# Patient Record
Sex: Female | Born: 1970 | Race: White | Hispanic: No | Marital: Married | Smoking: Former smoker
Health system: Southern US, Community
[De-identification: ages and names within clinical notes are randomized; demographics above are authoritative.]

## PROBLEM LIST (undated history)

## (undated) ENCOUNTER — Emergency Department (HOSPITAL_COMMUNITY): Admission: EM | Payer: Medicare Other | Source: Home / Self Care

## (undated) DIAGNOSIS — I10 Essential (primary) hypertension: Secondary | ICD-10-CM

## (undated) DIAGNOSIS — K219 Gastro-esophageal reflux disease without esophagitis: Secondary | ICD-10-CM

## (undated) DIAGNOSIS — H269 Unspecified cataract: Secondary | ICD-10-CM

## (undated) DIAGNOSIS — K8020 Calculus of gallbladder without cholecystitis without obstruction: Secondary | ICD-10-CM

## (undated) DIAGNOSIS — K2970 Gastritis, unspecified, without bleeding: Secondary | ICD-10-CM

## (undated) DIAGNOSIS — I219 Acute myocardial infarction, unspecified: Secondary | ICD-10-CM

## (undated) DIAGNOSIS — H5440 Blindness, one eye, unspecified eye: Secondary | ICD-10-CM

## (undated) DIAGNOSIS — I739 Peripheral vascular disease, unspecified: Secondary | ICD-10-CM

## (undated) DIAGNOSIS — G629 Polyneuropathy, unspecified: Secondary | ICD-10-CM

## (undated) DIAGNOSIS — I779 Disorder of arteries and arterioles, unspecified: Secondary | ICD-10-CM

## (undated) DIAGNOSIS — L97519 Non-pressure chronic ulcer of other part of right foot with unspecified severity: Secondary | ICD-10-CM

## (undated) DIAGNOSIS — I2510 Atherosclerotic heart disease of native coronary artery without angina pectoris: Secondary | ICD-10-CM

## (undated) DIAGNOSIS — Z85118 Personal history of other malignant neoplasm of bronchus and lung: Secondary | ICD-10-CM

## (undated) DIAGNOSIS — E7800 Pure hypercholesterolemia, unspecified: Secondary | ICD-10-CM

## (undated) DIAGNOSIS — C9500 Acute leukemia of unspecified cell type not having achieved remission: Secondary | ICD-10-CM

## (undated) DIAGNOSIS — R921 Mammographic calcification found on diagnostic imaging of breast: Secondary | ICD-10-CM

## (undated) DIAGNOSIS — D649 Anemia, unspecified: Secondary | ICD-10-CM

## (undated) DIAGNOSIS — I509 Heart failure, unspecified: Secondary | ICD-10-CM

## (undated) DIAGNOSIS — N185 Chronic kidney disease, stage 5: Secondary | ICD-10-CM

## (undated) DIAGNOSIS — N049 Nephrotic syndrome with unspecified morphologic changes: Secondary | ICD-10-CM

## (undated) HISTORY — DX: Gastro-esophageal reflux disease without esophagitis: K21.9

## (undated) HISTORY — PX: INCISION AND DRAINAGE BREAST ABSCESS: SUR672

## (undated) HISTORY — PX: LOBECTOMY: SHX5089

## (undated) HISTORY — DX: Blindness, one eye, unspecified eye: H54.40

## (undated) HISTORY — DX: Atherosclerotic heart disease of native coronary artery without angina pectoris: I25.10

## (undated) HISTORY — DX: Acute leukemia of unspecified cell type not having achieved remission: C95.00

---

## 1992-04-19 HISTORY — PX: TUBAL LIGATION: SHX77

## 2008-04-19 HISTORY — PX: VITRECTOMY: SHX106

## 2009-04-10 ENCOUNTER — Emergency Department (HOSPITAL_COMMUNITY): Admission: EM | Admit: 2009-04-10 | Discharge: 2009-04-10 | Payer: Self-pay | Admitting: Emergency Medicine

## 2010-02-10 ENCOUNTER — Ambulatory Visit (HOSPITAL_COMMUNITY): Admission: RE | Admit: 2010-02-10 | Discharge: 2010-02-10 | Payer: Self-pay | Admitting: Family Medicine

## 2010-07-01 LAB — GLUCOSE, CAPILLARY: Glucose-Capillary: 251 mg/dL — ABNORMAL HIGH (ref 70–99)

## 2010-12-25 ENCOUNTER — Other Ambulatory Visit: Payer: Self-pay

## 2010-12-25 ENCOUNTER — Emergency Department (HOSPITAL_COMMUNITY): Payer: Medicare Other

## 2010-12-25 ENCOUNTER — Emergency Department (HOSPITAL_COMMUNITY)
Admission: EM | Admit: 2010-12-25 | Discharge: 2010-12-25 | Disposition: A | Discharge status: Home / Self Care | Payer: Medicare Other | Attending: Emergency Medicine | Admitting: Emergency Medicine

## 2010-12-25 ENCOUNTER — Encounter: Payer: Self-pay | Admitting: *Deleted

## 2010-12-25 DIAGNOSIS — I498 Other specified cardiac arrhythmias: Secondary | ICD-10-CM | POA: Insufficient documentation

## 2010-12-25 DIAGNOSIS — J40 Bronchitis, not specified as acute or chronic: Secondary | ICD-10-CM | POA: Insufficient documentation

## 2010-12-25 DIAGNOSIS — E119 Type 2 diabetes mellitus without complications: Secondary | ICD-10-CM | POA: Insufficient documentation

## 2010-12-25 HISTORY — DX: Pure hypercholesterolemia, unspecified: E78.00

## 2010-12-25 LAB — BASIC METABOLIC PANEL
BUN: 19 mg/dL (ref 6–23)
CO2: 28 mEq/L (ref 19–32)
Calcium: 8.2 mg/dL — ABNORMAL LOW (ref 8.4–10.5)
Chloride: 101 mEq/L (ref 96–112)
GFR calc non Af Amer: >60 mL/min (ref >60)
Glucose, Bld: 247 mg/dL — ABNORMAL HIGH (ref 70–99)
Potassium: 4.2 mEq/L (ref 3.5–5.1)
Sodium: 133 mEq/L — ABNORMAL LOW (ref 135–145)

## 2010-12-25 LAB — DIFFERENTIAL
Basophils Absolute: 0 10*3/uL (ref 0.0–0.1)
Basophils Relative: 0 % (ref 0–1)
Eosinophils Absolute: 0.1 10*3/uL (ref 0.0–0.7)
Lymphocytes Relative: 14 % (ref 12–46)
Lymphs Abs: 1.6 10*3/uL (ref 0.7–4.0)
Monocytes Absolute: 0.7 10*3/uL (ref 0.1–1.0)
Monocytes Relative: 6 % (ref 3–12)
Neutrophils Relative %: 80 % — ABNORMAL HIGH (ref 43–77)

## 2010-12-25 LAB — CBC
HCT: 40.3 % (ref 36.0–46.0)
Hemoglobin: 13.8 g/dL (ref 12.0–15.0)
MCH: 31.4 pg (ref 26.0–34.0)
MCHC: 34.2 g/dL (ref 30.0–36.0)
MCV: 91.8 fL (ref 78.0–100.0)
RBC: 4.39 MIL/uL (ref 3.87–5.11)
RDW: 13 % (ref 11.5–15.5)
WBC: 11.9 10*3/uL — ABNORMAL HIGH (ref 4.0–10.5)

## 2010-12-25 MED ORDER — ALBUTEROL SULFATE (5 MG/ML) 0.5% IN NEBU
5.0000 mg | INHALATION_SOLUTION | Freq: Once | RESPIRATORY_TRACT | Status: AC
  Administered 2010-12-25: 5 mg via RESPIRATORY_TRACT
  Filled 2010-12-25: qty 1

## 2010-12-25 MED ORDER — IPRATROPIUM BROMIDE 0.02 % IN SOLN
0.5000 mg | Freq: Once | RESPIRATORY_TRACT | Status: AC
  Administered 2010-12-25: 0.5 mg via RESPIRATORY_TRACT
  Filled 2010-12-25: qty 2.5

## 2010-12-25 MED ORDER — PREDNISONE 20 MG PO TABS: ORAL_TABLET | ORAL | Status: DC

## 2010-12-25 MED ORDER — OXYCODONE-ACETAMINOPHEN 5-325 MG PO TABS
1.0000 | ORAL_TABLET | Freq: Once | ORAL | Status: AC
  Administered 2010-12-25: 1 via ORAL
  Filled 2010-12-25: qty 1

## 2010-12-25 MED ORDER — SODIUM CHLORIDE 0.9 % IV BOLUS (SEPSIS)
1000.0000 mL | Freq: Once | INTRAVENOUS | Status: AC
  Administered 2010-12-25: 1000 mL via INTRAVENOUS

## 2010-12-25 MED ORDER — DOXYCYCLINE HYCLATE 100 MG PO CAPS: 100.0000 mg | ORAL_CAPSULE | Freq: Two times a day (BID) | ORAL | Status: AC

## 2010-12-25 MED ORDER — PREDNISONE 20 MG PO TABS
60.0000 mg | ORAL_TABLET | Freq: Once | ORAL | Status: AC
  Administered 2010-12-25: 60 mg via ORAL
  Filled 2010-12-25: qty 3

## 2010-12-25 MED ORDER — ALBUTEROL SULFATE HFA 108 (90 BASE) MCG/ACT IN AERS
2.0000 | INHALATION_SPRAY | Freq: Once | RESPIRATORY_TRACT | Status: AC
  Administered 2010-12-25: 2 via RESPIRATORY_TRACT
  Filled 2010-12-25: qty 6.7

## 2010-12-25 NOTE — ED Notes (Signed)
Pt c/o nasal congestion x 4 days. States that she started coughing and it "moved into her chest" 2 days ago. Pt denies productive cough. Alert and oriented x 3. Skin warm and dry. Color pink. Breath sounds equal with bilateral expiratory wheezing.

## 2010-12-25 NOTE — ED Notes (Signed)
Family at bedside. 

## 2010-12-25 NOTE — ED Provider Notes (Signed)
History     CSN: [id number] Arrival date & time: 12/25/2010 12:49 PM Seen at 1350 Chief Complaint  Patient presents with  . Shortness of Breath   Patient is a 40 y.o. female presenting with shortness of breath. The history is provided by the patient.  Shortness of Breath  The current episode started yesterday. The problem is moderate. The symptoms are relieved by nothing. The symptoms are aggravated by nothing. Associated symptoms include cough and shortness of breath.  pt reports recent cough/congestion about 4 days ago.  Then in past 24 hours she has developed upper back pressure and feels SOB as well.  She also continues to have non-productive cough.   She also feel dyspnea on exertion   Past Medical History  Diagnosis Date  . Diabetes mellitus   . High cholesterol     No past surgical history on file.  No family history on file.  History  Substance Use Topics  . Smoking status: Not on file  . Smokeless tobacco: Not on file  . Alcohol Use:     OB History    Grav Para Term Preterm Abortions TAB SAB Ect Mult Living                  Review of Systems  Respiratory: Positive for cough and shortness of breath.   All other systems reviewed and are negative.    Physical Exam  BP 130/80  Pulse 110  Temp(Src) 98.5 F (36.9 C) (Oral)  Resp 22  Ht 5\' 7"  (1.702 m)  Wt 230 lb (104.327 kg)  BMI 36.02 kg/m2  SpO2 95%  Physical Exam  CONSTITUTIONAL: Well developed/well nourished HEAD AND FACE: Normocephalic/atraumatic EYES: EOMI/PERRL ENMT: Mucous membranes moist, nasal congestion NECK: supple no meningeal signs CV: S1/S2 noted, no murmurs/rubs/gallops noted LUNGS:rales left base, slight tachypnea noted ABDOMEN: soft, nontender, no rebound or guarding NEURO: Pt is awake/alert, moves all extremitiesx4 EXTREMITIES: pulses normal, full ROM, chronic/symmetric pitting edema noted bilateral LE SKIN: warm, color normal PSYCH: no abnormalities of mood noted   ED Course   Procedures  MDM Nursing notes reviewed and considered in documentation All labs/vitals reviewed and considered xrays reviewed and considered   Pt with cough/congestion now rales left base, concern for pneumonia    Date: 12/25/2010  Rate: 105  Rhythm: sinus tachycardia  QRS Axis: right  Intervals: normal  ST/T Wave abnormalities: nonspecific ST changes  Conduction Disutrbances:none  Narrative Interpretation:   Old EKG Reviewed: none available   4:02 PM Pt feels improved On repeat exam, now has bilateral wheezes, rales improved after nebs No signs of pneumonia on CXR She reports she only mild back discomfort, not pleuritic.  No h/o DVT/PE.  Not on estrogen products Reports h/o lung nodule that was found to be benign Suspect this is more c/w bronchitis, doubt PE Pt improved and wants to go home, discused strict return precautions 5:35 PM   Results for orders placed during the hospital encounter of 12/25/10  BASIC METABOLIC PANEL      Component Value Range   Sodium 133 (*) 135 - 145 (mEq/L)   Potassium 4.2  3.5 - 5.1 (mEq/L)   Chloride 101  96 - 112 (mEq/L)   CO2 28  19 - 32 (mEq/L)   Glucose, Bld 247 (*) 70 - 99 (mg/dL)   BUN 19  6 - 23 (mg/dL)   Creatinine, Ser 4.09  0.50 - 1.10 (mg/dL)   Calcium 8.2 (*) 8.4 - 10.5 (mg/dL)   GFR  calc non Af Amer >60  >60 (mL/min)   GFR calc Af Amer >60  >60 (mL/min)  CBC      Component Value Range   WBC 11.9 (*) 4.0 - 10.5 (K/uL)   RBC 4.39  3.87 - 5.11 (MIL/uL)   Hemoglobin 13.8  12.0 - 15.0 (g/dL)   HCT 78.2  95.6 - 21.3 (%)   MCV 91.8  78.0 - 100.0 (fL)   MCH 31.4  26.0 - 34.0 (pg)   MCHC 34.2  30.0 - 36.0 (g/dL)   RDW 08.6  57.8 - 46.9 (%)   Platelets 263  150 - 400 (K/uL)  DIFFERENTIAL      Component Value Range   Neutrophils Relative 80 (*) 43 - 77 (%)   Neutro Abs 9.5 (*) 1.7 - 7.7 (K/uL)   Lymphocytes Relative 14  12 - 46 (%)   Lymphs Abs 1.6  0.7 - 4.0 (K/uL)   Monocytes Relative 6  3 - 12 (%)   Monocytes  Absolute 0.7  0.1 - 1.0 (K/uL)   Eosinophils Relative 0  0 - 5 (%)   Eosinophils Absolute 0.1  0.0 - 0.7 (K/uL)   Basophils Relative 0  0 - 1 (%)   Basophils Absolute 0.0  0.0 - 0.1 (K/uL)   Dg Chest 2 View  12/25/2010  *RADIOLOGY REPORT*  Clinical Data: Short of breath, cough  CHEST - 2 VIEW  Comparison: None.  Findings: The lungs are clear.  The heart is within normal limits in size.  No bony abnormality is seen.  IMPRESSION: No active lung disease.  Original Report Authenticated By: Juline Patch, M.D.          Joya Gaskins, MD 12/25/10 [phone]

## 2010-12-25 NOTE — ED Notes (Signed)
C/o URI s/s x 1 week with non-productive cough; reports onset of shortness of breath last night, worse today; c/o dull ache to LUE and left neck onset last night

## 2010-12-25 NOTE — ED Notes (Signed)
Pt c/o waking up at 5 am and getting very dizzy and falling down. Pt states that when she lies down that she feels pressure in her neck and chest and that she can't breath. Pt also c/o numbness and tingling in her right arm. Pt alert and oriented x 3. Skin warm and dry. Color pink. Breath sounds clear and equal bilaterally. Strong hand grips bilaterally. CCM showing NSR.

## 2010-12-26 ENCOUNTER — Encounter (HOSPITAL_COMMUNITY): Payer: Self-pay | Admitting: *Deleted

## 2011-03-20 ENCOUNTER — Emergency Department (HOSPITAL_COMMUNITY)
Admission: EM | Admit: 2011-03-20 | Discharge: 2011-03-20 | Disposition: A | Discharge status: Home / Self Care | Payer: Medicare Other | Attending: Emergency Medicine | Admitting: Emergency Medicine

## 2011-03-20 ENCOUNTER — Encounter (HOSPITAL_COMMUNITY): Payer: Self-pay

## 2011-03-20 ENCOUNTER — Emergency Department (HOSPITAL_COMMUNITY): Payer: Medicare Other

## 2011-03-20 DIAGNOSIS — J40 Bronchitis, not specified as acute or chronic: Secondary | ICD-10-CM

## 2011-03-20 DIAGNOSIS — F17200 Nicotine dependence, unspecified, uncomplicated: Secondary | ICD-10-CM | POA: Insufficient documentation

## 2011-03-20 DIAGNOSIS — J069 Acute upper respiratory infection, unspecified: Secondary | ICD-10-CM | POA: Insufficient documentation

## 2011-03-20 DIAGNOSIS — E119 Type 2 diabetes mellitus without complications: Secondary | ICD-10-CM | POA: Insufficient documentation

## 2011-03-20 DIAGNOSIS — E7800 Pure hypercholesterolemia, unspecified: Secondary | ICD-10-CM | POA: Insufficient documentation

## 2011-03-20 MED ORDER — AZITHROMYCIN 250 MG PO TABS: 250.0000 mg | ORAL_TABLET | Freq: Every day | ORAL | Status: AC

## 2011-03-20 NOTE — ED Notes (Signed)
Pt presents with URI symptoms and hymoptysis that started today.

## 2011-03-20 NOTE — ED Notes (Signed)
Pt given discharge instructions, paperwork & prescription(s), pt verbalized understanding.   

## 2011-03-20 NOTE — ED Provider Notes (Signed)
History     CSN: [id number] Arrival date & time: 03/20/2011  6:17 PM   First MD Initiated Contact with Patient 03/20/11 1832      Chief Complaint  Patient presents with  . Hemoptysis  . URI    (Consider location/radiation/quality/duration/timing/severity/associated sxs/prior treatment) Patient is a 40 y.o. female presenting with URI and cough. The history is provided by the patient (the pt complains of a cough.  she states she has coughed up some blood).  URI The primary symptoms include fatigue and cough. Primary symptoms do not include fever, headaches, sore throat, abdominal pain or rash. The current episode started yesterday. This is a new problem. The problem has not changed since onset. The fatigue began today. The fatigue has been worsening since its onset. The fatigue is worsened by exercise.  Symptoms associated with the illness include plugged ear sensation. The illness is not associated with chills, facial pain, sinus pressure or congestion.  Cough This is a new problem. The current episode started yesterday. The problem occurs constantly. The problem has not changed since onset.The cough is productive of sputum. There has been no fever. Pertinent negatives include no chest pain, no chills, no headaches and no sore throat. She has tried nothing for the symptoms. She is not a smoker. Her past medical history does not include bronchiectasis or COPD.    Past Medical History  Diagnosis Date  . Diabetes mellitus   . High cholesterol     Past Surgical History  Procedure Date  . Cesarean section   . Breast surgery     No family history on file.  History  Substance Use Topics  . Smoking status: Current Everyday Smoker -- 2.0 packs/day  . Smokeless tobacco: Not on file  . Alcohol Use: No    OB History    Grav Para Term Preterm Abortions TAB SAB Ect Mult Living                  Review of Systems  Constitutional: Positive for fatigue. Negative for fever and chills.   HENT: Negative for congestion, sore throat, sinus pressure and ear discharge.   Eyes: Negative for discharge.  Respiratory: Positive for cough.   Cardiovascular: Negative for chest pain.  Gastrointestinal: Negative for abdominal pain and diarrhea.  Genitourinary: Negative for frequency and hematuria.  Musculoskeletal: Negative for back pain.  Skin: Negative for rash.  Neurological: Negative for seizures and headaches.  Hematological: Negative.   Psychiatric/Behavioral: Negative for hallucinations.    Allergies  Review of patient's allergies indicates no known allergies.  Home Medications   Current Outpatient Rx  Name Route Sig Dispense Refill  . GLIPIZIDE 5 MG PO TABS Oral Take 5 mg by mouth 2 (two) times daily before a meal.      . METFORMIN HCL 1000 MG PO TABS Oral Take 1,000 mg by mouth 2 (two) times daily.      . AZITHROMYCIN 250 MG PO TABS Oral Take 1 tablet (250 mg total) by mouth daily. Take first 2 tablets together, then 1 every day until finished. 6 tablet 0    BP 157/98  Pulse 101  Temp(Src) 97.9 F (36.6 C) (Oral)  Resp 16  Ht 5\' 7"  (1.702 m)  Wt 224 lb (101.606 kg)  BMI 35.08 kg/m2  SpO2 97%  LMP 03/06/2011  Physical Exam  Constitutional: She is oriented to person, place, and time. She appears well-developed.  HENT:  Head: Normocephalic and atraumatic.  Eyes: Conjunctivae and EOM are normal.  No scleral icterus.  Neck: Neck supple. No thyromegaly present.  Cardiovascular: Normal rate and regular rhythm.  Exam reveals no gallop and no friction rub.   No murmur heard. Pulmonary/Chest: No stridor. She has no wheezes. She has no rales. She exhibits no tenderness.  Abdominal: She exhibits no distension. There is no tenderness. There is no rebound.  Musculoskeletal: Normal range of motion. She exhibits no edema.  Lymphadenopathy:    She has no cervical adenopathy.  Neurological: She is oriented to person, place, and time. Coordination normal.  Skin: No rash  noted. No erythema.  Psychiatric: She has a normal mood and affect. Her behavior is normal.    ED Course  Procedures (including critical care time)  Labs Reviewed - No data to display Dg Chest 2 View  03/20/2011  *RADIOLOGY REPORT*  Clinical Data: Cough, shortness of breath, hemoptysis  CHEST - 2 VIEW  Comparison: December 25, 2010  Findings: The cardiac silhouette, mediastinum, pulmonary vasculature are within normal limits.  Both lungs are clear. There is no acute bony abnormality.  IMPRESSION: There is no evidence of acute cardiac or pulmonary process.  Original Report Authenticated By: Brandon Melnick, M.D.     1. Bronchitis       MDM          Benny Lennert, MD 03/20/11 [phone]

## 2011-03-20 NOTE — ED Notes (Signed)
Pt reports a cough for a week. Noticed some pink to red color in sputum. Pt denies any fever or sweets

## 2011-04-25 ENCOUNTER — Emergency Department (HOSPITAL_COMMUNITY)
Admission: EM | Admit: 2011-04-25 | Discharge: 2011-04-25 | Disposition: A | Discharge status: Home / Self Care | Payer: Medicare Other | Attending: Emergency Medicine | Admitting: Emergency Medicine

## 2011-04-25 ENCOUNTER — Encounter (HOSPITAL_COMMUNITY): Payer: Self-pay

## 2011-04-25 DIAGNOSIS — E789 Disorder of lipoprotein metabolism, unspecified: Secondary | ICD-10-CM | POA: Insufficient documentation

## 2011-04-25 DIAGNOSIS — R5381 Other malaise: Secondary | ICD-10-CM | POA: Insufficient documentation

## 2011-04-25 DIAGNOSIS — R112 Nausea with vomiting, unspecified: Secondary | ICD-10-CM | POA: Insufficient documentation

## 2011-04-25 DIAGNOSIS — N390 Urinary tract infection, site not specified: Secondary | ICD-10-CM | POA: Insufficient documentation

## 2011-04-25 DIAGNOSIS — F17200 Nicotine dependence, unspecified, uncomplicated: Secondary | ICD-10-CM | POA: Insufficient documentation

## 2011-04-25 DIAGNOSIS — Z79899 Other long term (current) drug therapy: Secondary | ICD-10-CM | POA: Insufficient documentation

## 2011-04-25 DIAGNOSIS — M7989 Other specified soft tissue disorders: Secondary | ICD-10-CM | POA: Insufficient documentation

## 2011-04-25 DIAGNOSIS — IMO0001 Reserved for inherently not codable concepts without codable children: Secondary | ICD-10-CM | POA: Insufficient documentation

## 2011-04-25 DIAGNOSIS — R05 Cough: Secondary | ICD-10-CM | POA: Insufficient documentation

## 2011-04-25 DIAGNOSIS — R062 Wheezing: Secondary | ICD-10-CM | POA: Insufficient documentation

## 2011-04-25 DIAGNOSIS — E119 Type 2 diabetes mellitus without complications: Secondary | ICD-10-CM | POA: Insufficient documentation

## 2011-04-25 DIAGNOSIS — H02849 Edema of unspecified eye, unspecified eyelid: Secondary | ICD-10-CM | POA: Insufficient documentation

## 2011-04-25 DIAGNOSIS — R609 Edema, unspecified: Secondary | ICD-10-CM | POA: Insufficient documentation

## 2011-04-25 DIAGNOSIS — R059 Cough, unspecified: Secondary | ICD-10-CM | POA: Insufficient documentation

## 2011-04-25 DIAGNOSIS — R21 Rash and other nonspecific skin eruption: Secondary | ICD-10-CM

## 2011-04-25 DIAGNOSIS — H5789 Other specified disorders of eye and adnexa: Secondary | ICD-10-CM | POA: Insufficient documentation

## 2011-04-25 LAB — COMPREHENSIVE METABOLIC PANEL
ALT: 12 U/L (ref 0–35)
AST: 19 U/L (ref 0–37)
Albumin: 1.1 g/dL — ABNORMAL LOW (ref 3.5–5.2)
BUN: 17 mg/dL (ref 6–23)
CO2: 27 mEq/L (ref 19–32)
Chloride: 103 mEq/L (ref 96–112)
Creatinine, Ser: 0.93 mg/dL (ref 0.50–1.10)
GFR calc Af Amer: 87 mL/min — ABNORMAL LOW (ref >90)
GFR calc non Af Amer: 75 mL/min — ABNORMAL LOW (ref >90)
Glucose, Bld: 100 mg/dL — ABNORMAL HIGH (ref 70–99)
Potassium: 4.3 mEq/L (ref 3.5–5.1)
Sodium: 131 mEq/L — ABNORMAL LOW (ref 135–145)
Total Bilirubin: 0.1 mg/dL — ABNORMAL LOW (ref 0.3–1.2)

## 2011-04-25 LAB — URINE MICROSCOPIC-ADD ON

## 2011-04-25 LAB — CBC
HCT: 37.6 % (ref 36.0–46.0)
Hemoglobin: 12.8 g/dL (ref 12.0–15.0)
MCH: 31 pg (ref 26.0–34.0)
MCV: 91 fL (ref 78.0–100.0)
Platelets: 275 10*3/uL (ref 150–400)
RBC: 4.13 MIL/uL (ref 3.87–5.11)
RDW: 13.3 % (ref 11.5–15.5)
WBC: 4.7 10*3/uL (ref 4.0–10.5)

## 2011-04-25 LAB — GLUCOSE, CAPILLARY: Glucose-Capillary: 104 mg/dL — ABNORMAL HIGH (ref 70–99)

## 2011-04-25 LAB — URINALYSIS, ROUTINE W REFLEX MICROSCOPIC
Glucose, UA: 250 mg/dL — AB
Ketones, ur: NEGATIVE mg/dL
Leukocytes, UA: NEGATIVE
Nitrite: NEGATIVE
Protein, ur: >300 mg/dL — AB
Specific Gravity, Urine: 1.02 (ref 1.005–1.030)
Urobilinogen, UA: 0.2 mg/dL (ref 0.0–1.0)
pH: 7 (ref 5.0–8.0)

## 2011-04-25 LAB — DIFFERENTIAL
Basophils Relative: 1 % (ref 0–1)
Eosinophils Absolute: 0 10*3/uL (ref 0.0–0.7)
Eosinophils Relative: 1 % (ref 0–5)
Lymphocytes Relative: 21 % (ref 12–46)
Monocytes Absolute: 0.4 10*3/uL (ref 0.1–1.0)
Monocytes Relative: 7 % (ref 3–12)
Neutro Abs: 3.3 10*3/uL (ref 1.7–7.7)

## 2011-04-25 MED ORDER — CEPHALEXIN 500 MG PO CAPS
500.0000 mg | ORAL_CAPSULE | Freq: Once | ORAL | Status: AC
  Administered 2011-04-25: 500 mg via ORAL
  Filled 2011-04-25: qty 1

## 2011-04-25 MED ORDER — CEPHALEXIN 500 MG PO CAPS: 500.0000 mg | ORAL_CAPSULE | Freq: Four times a day (QID) | ORAL | Status: DC

## 2011-04-25 MED ORDER — ONDANSETRON HCL 4 MG/2ML IJ SOLN
4.0000 mg | Freq: Once | INTRAMUSCULAR | Status: AC
  Administered 2011-04-25: 4 mg via INTRAVENOUS
  Filled 2011-04-25: qty 2

## 2011-04-25 MED ORDER — PROMETHAZINE HCL 25 MG PO TABS: 12.5000 mg | ORAL_TABLET | Freq: Four times a day (QID) | ORAL | Status: DC | PRN

## 2011-04-25 MED ORDER — HYDROMORPHONE HCL PF 1 MG/ML IJ SOLN
1.0000 mg | Freq: Once | INTRAMUSCULAR | Status: AC
  Administered 2011-04-25: 1 mg via INTRAVENOUS
  Filled 2011-04-25: qty 1

## 2011-04-25 MED ORDER — HYDROCODONE-ACETAMINOPHEN 5-325 MG PO TABS
1.0000 | ORAL_TABLET | ORAL | Status: AC | PRN
Start: 2011-04-25 — End: 2011-05-05

## 2011-04-25 MED ORDER — SODIUM CHLORIDE 0.9 % IV BOLUS (SEPSIS)
1000.0000 mL | Freq: Once | INTRAVENOUS | Status: AC
  Administered 2011-04-25: 1000 mL via INTRAVENOUS

## 2011-04-25 NOTE — ED Notes (Signed)
Pt presents with weakness, n/v, and swelling of legs/feet/hands/and abdomen x 3 days.

## 2011-04-25 NOTE — ED Provider Notes (Signed)
History   This chart was scribed for [employer]. Colon Branch, MD by Sofie Rower. The patient was seen in room [facility]12/[facility]12 and the patient's care was started at 7:28PM.    CSN: [id number]  Arrival date & time 04/25/11  1608   First MD Initiated Contact with Patient 04/25/11 1807      Chief Complaint  Patient presents with  . Weakness  . Nausea  . Emesis  . Leg Swelling    (Consider location/radiation/quality/duration/timing/severity/associated sxs/prior treatment) HPI  Hannah [name] is a 41 y.o. female who presents to the Emergency Department complaining of moderate, constant abdominal pain onset two days ago with associated symptoms of cough, myalgia, nausea, swelling of legs, hands, eyes, rash on both lower legs. Pt is on her period at present. Pt denies diarrhea.  Pt does not have a PCP.   Past Medical History  Diagnosis Date  . Diabetes mellitus   . High cholesterol     Past Surgical History  Procedure Date  . Cesarean section   . Breast surgery   . Eye surgery     No family history on file.  History  Substance Use Topics  . Smoking status: Current Everyday Smoker -- 2.0 packs/day  . Smokeless tobacco: Not on file  . Alcohol Use: No    OB History    Grav Para Term Preterm Abortions TAB SAB Ect Mult Living                  Review of Systems  10 Systems reviewed and are negative for acute change except as noted in the HPI.   Allergies  Review of patient's allergies indicates no known allergies.  Home Medications   Current Outpatient Rx  Name Route Sig Dispense Refill  . GLIPIZIDE 5 MG PO TABS Oral Take 5 mg by mouth 2 (two) times daily before a meal.      . METFORMIN HCL 1000 MG PO TABS Oral Take 1,000 mg by mouth 2 (two) times daily.        BP 141/80  Pulse 99  Temp(Src) 98.3 F (36.8 C) (Oral)  Resp 20  Ht 5\' 7"  (1.702 m)  Wt 240 lb (108.863 kg)  BMI 37.59 kg/m2  SpO2 99%  LMP 04/24/2011  Physical Exam  Nursing note and vitals  reviewed. Constitutional: She is oriented to person, place, and time. She appears well-developed and well-nourished. No distress.  HENT:  Head: Normocephalic and atraumatic.       Edema to the upper eyelid. Periorbital edema.   Eyes: Conjunctivae and EOM are normal. Pupils are equal, round, and reactive to light.  Neck: Normal range of motion. Neck supple. No tracheal deviation present.  Cardiovascular: Normal rate, regular rhythm and normal heart sounds.  Exam reveals no gallop and no friction rub.   No murmur heard. Pulmonary/Chest: Effort normal. No respiratory distress. She has wheezes (Diffuse wheezing on the right, intermittant on left.).       Coarse cough.   Abdominal: Soft. Bowel sounds are normal. She exhibits no distension. There is no tenderness.  Musculoskeletal: Normal range of motion. She exhibits edema (1+ to the dorsum of the hands. ). She exhibits no tenderness.       Bilateral 1+ edema to both lower extremities. Rash to both lower extremities.   Neurological: She is alert and oriented to person, place, and time. No sensory deficit.  Skin: Skin is warm and dry.       Linear excoriations to the skin.  Psychiatric: She has a normal mood and affect. Her behavior is normal.    ED Course  Procedures (including critical care time)  DIAGNOSTIC STUDIES: Oxygen Saturation is 99% on room air, normal by my interpretation.    COORDINATION OF CARE:    Results for orders placed during the hospital encounter of 04/25/11  GLUCOSE, CAPILLARY      Component Value Range   Glucose-Capillary 104 (*) 70 - 99 (mg/dL)  URINALYSIS, ROUTINE W REFLEX MICROSCOPIC      Component Value Range   Color, Urine YELLOW  YELLOW    APPearance CLOUDY (*) CLEAR    Specific Gravity, Urine 1.020  1.005 - 1.030    pH 7.0  5.0 - 8.0    Glucose, UA 250 (*) NEGATIVE (mg/dL)   Hgb urine dipstick LARGE (*) NEGATIVE    Bilirubin Urine NEGATIVE  NEGATIVE    Ketones, ur NEGATIVE  NEGATIVE (mg/dL)    Protein, ur >782 (*) NEGATIVE (mg/dL)   Urobilinogen, UA 0.2  0.0 - 1.0 (mg/dL)   Nitrite NEGATIVE  NEGATIVE    Leukocytes, UA NEGATIVE  NEGATIVE   CBC      Component Value Range   WBC 4.7  4.0 - 10.5 (K/uL)   RBC 4.13  3.87 - 5.11 (MIL/uL)   Hemoglobin 12.8  12.0 - 15.0 (g/dL)   HCT 95.6  21.3 - 08.6 (%)   MCV 91.0  78.0 - 100.0 (fL)   MCH 31.0  26.0 - 34.0 (pg)   MCHC 34.0  30.0 - 36.0 (g/dL)   RDW 57.8  46.9 - 62.9 (%)   Platelets 275  150 - 400 (K/uL)  DIFFERENTIAL      Component Value Range   Neutrophils Relative 70  43 - 77 (%)   Neutro Abs 3.3  1.7 - 7.7 (K/uL)   Lymphocytes Relative 21  12 - 46 (%)   Lymphs Abs 1.0  0.7 - 4.0 (K/uL)   Monocytes Relative 7  3 - 12 (%)   Monocytes Absolute 0.4  0.1 - 1.0 (K/uL)   Eosinophils Relative 1  0 - 5 (%)   Eosinophils Absolute 0.0  0.0 - 0.7 (K/uL)   Basophils Relative 1  0 - 1 (%)   Basophils Absolute 0.0  0.0 - 0.1 (K/uL)  COMPREHENSIVE METABOLIC PANEL      Component Value Range   Sodium 131 (*) 135 - 145 (mEq/L)   Potassium 4.3  3.5 - 5.1 (mEq/L)   Chloride 103  96 - 112 (mEq/L)   CO2 27  19 - 32 (mEq/L)   Glucose, Bld 100 (*) 70 - 99 (mg/dL)   BUN 17  6 - 23 (mg/dL)   Creatinine, Ser 5.28  0.50 - 1.10 (mg/dL)   Calcium 8.2 (*) 8.4 - 10.5 (mg/dL)   Total Protein 4.3 (*) 6.0 - 8.3 (g/dL)   Albumin 1.1 (*) 3.5 - 5.2 (g/dL)   AST 19  0 - 37 (U/L)   ALT 12  0 - 35 (U/L)   Alkaline Phosphatase 63  39 - 117 (U/L)   Total Bilirubin 0.1 (*) 0.3 - 1.2 (mg/dL)   GFR calc non Af Amer 75 (*) >90 (mL/min)   GFR calc Af Amer 87 (*) >90 (mL/min)  URINE MICROSCOPIC-ADD ON      Component Value Range   WBC, UA 7-10  <3 (WBC/hpf)   RBC / HPF TOO NUMEROUS TO COUNT  <3 (RBC/hpf)   Bacteria, UA FEW (*) RARE    No  results found.     MDM  Patient presents with abdominal pain and also a rash to her legs and swelling to her hands and feet.  Labs unremarkable with the exception of possible early UTI. Given IVF, analgesics and  antiemetic with improvement. She had taken PO fluids. Initiated antibiotic therapy. Discussed with patient use of benadryl for allergic symptoms.Pt feels improved after observation and/or treatment in ED.Pt stable in ED with no significant deterioration in condition.The patient appears reasonably screened and/or stabilized for discharge and I doubt any other medical condition or other [facility] requiring further screening, evaluation, or treatment in the ED at this time prior to discharge.  7:31PM- EDP at bedside discusses treatment plan.  I personally performed the services described in this documentation, which was scribed in my presence. The recorded information has been reviewed and considered.  MDM Reviewed: nursing note and vitals Interpretation: labs         Nicoletta Dress. Colon Branch, MD 04/27/11 [phone]

## 2011-04-25 NOTE — Discharge Instructions (Signed)
Take all of the antibiotic. You may use benadryl as frequently as 4 times a day as needed for itching and swelling. Use the pain and nausea medicine as needed. For the areas on your legs, use a heavy cream like Eucerin or Cetaphil cream. Follow up with your doctor.

## 2011-04-28 ENCOUNTER — Emergency Department (HOSPITAL_COMMUNITY)
Admission: EM | Admit: 2011-04-28 | Discharge: 2011-04-29 | Disposition: A | Discharge status: Home / Self Care | Payer: Medicare Other | Attending: Emergency Medicine | Admitting: Emergency Medicine

## 2011-04-28 ENCOUNTER — Encounter (HOSPITAL_COMMUNITY): Payer: Self-pay | Admitting: Emergency Medicine

## 2011-04-28 DIAGNOSIS — R112 Nausea with vomiting, unspecified: Secondary | ICD-10-CM | POA: Insufficient documentation

## 2011-04-28 DIAGNOSIS — E789 Disorder of lipoprotein metabolism, unspecified: Secondary | ICD-10-CM | POA: Insufficient documentation

## 2011-04-28 DIAGNOSIS — A599 Trichomoniasis, unspecified: Secondary | ICD-10-CM | POA: Insufficient documentation

## 2011-04-28 DIAGNOSIS — E119 Type 2 diabetes mellitus without complications: Secondary | ICD-10-CM | POA: Insufficient documentation

## 2011-04-28 DIAGNOSIS — Z79899 Other long term (current) drug therapy: Secondary | ICD-10-CM | POA: Insufficient documentation

## 2011-04-28 DIAGNOSIS — N390 Urinary tract infection, site not specified: Secondary | ICD-10-CM

## 2011-04-28 DIAGNOSIS — F17200 Nicotine dependence, unspecified, uncomplicated: Secondary | ICD-10-CM | POA: Insufficient documentation

## 2011-04-28 DIAGNOSIS — R51 Headache: Secondary | ICD-10-CM | POA: Insufficient documentation

## 2011-04-28 DIAGNOSIS — R109 Unspecified abdominal pain: Secondary | ICD-10-CM | POA: Insufficient documentation

## 2011-04-28 LAB — POCT I-STAT, CHEM 8
BUN: 16 mg/dL (ref 6–23)
Calcium, Ion: 1.06 mmol/L — ABNORMAL LOW (ref 1.12–1.32)
Chloride: 106 mEq/L (ref 96–112)
Creatinine, Ser: 1 mg/dL (ref 0.50–1.10)

## 2011-04-28 MED ORDER — SODIUM CHLORIDE 0.9 % IV SOLN: INTRAVENOUS | Status: DC

## 2011-04-28 MED ORDER — SODIUM CHLORIDE 0.9 % IV BOLUS (SEPSIS)
1000.0000 mL | Freq: Once | INTRAVENOUS | Status: AC
  Administered 2011-04-29: 1000 mL via INTRAVENOUS

## 2011-04-28 NOTE — ED Notes (Signed)
Pt c/o pain in bil upper quad.  With nausea and vomiting since Fri.  St's also has swelling in hands and eyes for several days.

## 2011-04-28 NOTE — ED Provider Notes (Signed)
History     CSN: [id number]  Arrival date & time 04/28/11  2016   First MD Initiated Contact with Patient 04/28/11 2326      Chief Complaint  Patient presents with  . Abdominal Pain    (Consider location/radiation/quality/duration/timing/severity/associated sxs/prior treatment) HPI This is a 41 year old white female who complains of 5 day history of right upper quadrant and left lower quadrant abdominal pain. The right upper quadrant abdominal pain is mild at the present time but worse after eating. The pain in the left lower quadrant is also mild right now but worse with coughing or certain movements. She was seen on January 6 [facility], diagnosed with a urinary tract infection, and treated with Keflex, hydrocodone and Phenergan. She states that she has not improve despite treatment. The pain has been associated with nausea and vomiting, also worse after eating. She also complains of swelling in her hands and around her eyes, worse at night. She also complains of headaches at night. She has known gallstones for which she has not followed up.  Past Medical History  Diagnosis Date  . Diabetes mellitus   . High cholesterol     Past Surgical History  Procedure Date  . Cesarean section   . Breast surgery   . Eye surgery     No family history on file.  History  Substance Use Topics  . Smoking status: Current Everyday Smoker -- 2.0 packs/day  . Smokeless tobacco: Not on file  . Alcohol Use: No    OB History    Grav Para Term Preterm Abortions TAB SAB Ect Mult Living                  Review of Systems  All other systems reviewed and are negative.    Allergies  Review of patient's allergies indicates no known allergies.  Home Medications   Current Outpatient Rx  Name Route Sig Dispense Refill  . CEPHALEXIN 500 MG PO CAPS Oral Take 1 capsule (500 mg total) by mouth 4 (four) times daily. 28 capsule 0  . GLIPIZIDE 5 MG PO TABS Oral Take 5 mg by mouth 2 (two)  times daily before a meal.      . HYDROCODONE-ACETAMINOPHEN 5-325 MG PO TABS Oral Take 1 tablet by mouth every 4 (four) hours as needed for pain. 15 tablet 0  . IBUPROFEN 200 MG PO TABS Oral Take 200 mg by mouth every 6 (six) hours as needed. For pain    . METFORMIN HCL 1000 MG PO TABS Oral Take 1,000 mg by mouth 2 (two) times daily.      Marland Kitchen PROMETHAZINE HCL 25 MG PO TABS Oral Take 0.5 tablets (12.5 mg total) by mouth every 6 (six) hours as needed for nausea. 10 tablet 0    BP 128/85  Pulse 102  Temp(Src) 99.4 F (37.4 C) (Oral)  Resp 20  SpO2 94%  LMP 04/24/2011  Physical Exam General: Well-developed, well-nourished female in no acute distress; appearance consistent with age of record HENT: normocephalic, atraumatic Eyes: pupils equal round and reactive to light; extraocular muscles intact Neck: supple Heart: regular rate and rhythm; tachycardia Lungs: clear to auscultation bilaterally Abdomen: soft; nondistended; mild right upper quadrant and left mid abdominal tenderness; no masses or hepatosplenomegaly; bowel sounds present GU: Urine grossly cloudy Extremities: No deformity; full range of motion Neurologic: Awake, alert and oriented; motor function intact in all extremities and symmetric; no facial droop Skin: Warm and dry; trace edema of hands and  periorbital areas Psychiatric: Flat affect    ED Course  Procedures (including critical care time)     MDM   Nursing notes and vitals signs, including pulse oximetry, reviewed.  Summary of this visit's results, reviewed by myself:  Labs:  Results for orders placed during the hospital encounter of 04/28/11  URINALYSIS, ROUTINE W REFLEX MICROSCOPIC      Component Value Range   Color, Urine YELLOW  YELLOW    APPearance CLOUDY (*) CLEAR    Specific Gravity, Urine 1.026  1.005 - 1.030    pH 7.5  5.0 - 8.0    Glucose, UA 250 (*) NEGATIVE (mg/dL)   Hgb urine dipstick MODERATE (*) NEGATIVE    Bilirubin Urine NEGATIVE   NEGATIVE    Ketones, ur 15 (*) NEGATIVE (mg/dL)   Protein, ur >478 (*) NEGATIVE (mg/dL)   Urobilinogen, UA 1.0  0.0 - 1.0 (mg/dL)   Nitrite NEGATIVE  NEGATIVE    Leukocytes, UA SMALL (*) NEGATIVE   HEPATIC FUNCTION PANEL      Component Value Range   Total Protein 4.4 (*) 6.0 - 8.3 (g/dL)   Albumin 0.9 (*) 3.5 - 5.2 (g/dL)   AST 16  0 - 37 (U/L)   ALT 12  0 - 35 (U/L)   Alkaline Phosphatase 69  39 - 117 (U/L)   Total Bilirubin 0.2 (*) 0.3 - 1.2 (mg/dL)   Bilirubin, Direct <2.9  0.0 - 0.3 (mg/dL)   Indirect Bilirubin NOT CALCULATED  0.3 - 0.9 (mg/dL)  LIPASE, BLOOD      Component Value Range   Lipase 8 (*) 11 - 59 (U/L)  CBC      Component Value Range   WBC 6.5  4.0 - 10.5 (K/uL)   RBC 4.07  3.87 - 5.11 (MIL/uL)   Hemoglobin 12.2  12.0 - 15.0 (g/dL)   HCT 56.2  13.0 - 86.5 (%)   MCV 88.9  78.0 - 100.0 (fL)   MCH 30.0  26.0 - 34.0 (pg)   MCHC 33.7  30.0 - 36.0 (g/dL)   RDW 78.4  69.6 - 29.5 (%)   Platelets 307  150 - 400 (K/uL)  DIFFERENTIAL      Component Value Range   Neutrophils Relative 64  43 - 77 (%)   Lymphocytes Relative 25  12 - 46 (%)   Monocytes Relative 9  3 - 12 (%)   Eosinophils Relative 1  0 - 5 (%)   Basophils Relative 1  0 - 1 (%)   Neutro Abs 4.1  1.7 - 7.7 (K/uL)   Lymphs Abs 1.6  0.7 - 4.0 (K/uL)   Monocytes Absolute 0.6  0.1 - 1.0 (K/uL)   Eosinophils Absolute 0.1  0.0 - 0.7 (K/uL)   Basophils Absolute 0.1  0.0 - 0.1 (K/uL)   WBC Morphology ATYPICAL LYMPHOCYTES    POCT I-STAT, CHEM 8      Component Value Range   Sodium 137  135 - 145 (mEq/L)   Potassium 3.9  3.5 - 5.1 (mEq/L)   Chloride 106  96 - 112 (mEq/L)   BUN 16  6 - 23 (mg/dL)   Creatinine, Ser 2.84  0.50 - 1.10 (mg/dL)   Glucose, Bld 132 (*) 70 - 99 (mg/dL)   Calcium, Ion 4.40 (*) 1.12 - 1.32 (mmol/L)   TCO2 22  0 - 100 (mmol/L)   Hemoglobin 11.6 (*) 12.0 - 15.0 (g/dL)   HCT 10.2 (*) 72.5 - 46.0 (%)  URINE MICROSCOPIC-ADD ON  Component Value Range   Squamous Epithelial / LPF MANY  (*) RARE    WBC, UA 11-20  <3 (WBC/hpf)   RBC / HPF 7-10  <3 (RBC/hpf)   Bacteria, UA MANY (*) RARE    Casts HYALINE CASTS (*) NEGATIVE    Crystals TRIPLE PHOSPHATE CRYSTALS (*) NEGATIVE    Urine-Other TRICHOMONAS PRESENT    MONONUCLEOSIS SCREEN      Component Value Range   Mono Screen NEGATIVE  NEGATIVE    4:39 AM Patient denies abdominal pain at this time. Her abdomen is soft and nontender. We will switch her antibiotic as the Keflex does not appear to be treated for urinary tract infection. We have also treated for Trichomonas. She has not vomited while in the ED.          Hanley Seamen, MD 04/29/11 [phone]

## 2011-04-29 LAB — DIFFERENTIAL
Basophils Relative: 1 % (ref 0–1)
Eosinophils Relative: 1 % (ref 0–5)
Lymphs Abs: 1.6 10*3/uL (ref 0.7–4.0)
Monocytes Relative: 9 % (ref 3–12)
Neutro Abs: 4.1 10*3/uL (ref 1.7–7.7)

## 2011-04-29 LAB — URINALYSIS, ROUTINE W REFLEX MICROSCOPIC
Glucose, UA: 250 mg/dL — AB
Nitrite: NEGATIVE
Specific Gravity, Urine: 1.026 (ref 1.005–1.030)
pH: 7.5 (ref 5.0–8.0)

## 2011-04-29 LAB — HEPATIC FUNCTION PANEL
ALT: 12 U/L (ref 0–35)
AST: 16 U/L (ref 0–37)
Albumin: 0.9 g/dL — ABNORMAL LOW (ref 3.5–5.2)
Total Protein: 4.4 g/dL — ABNORMAL LOW (ref 6.0–8.3)

## 2011-04-29 LAB — CBC
HCT: 36.2 % (ref 36.0–46.0)
Hemoglobin: 12.2 g/dL (ref 12.0–15.0)
MCH: 30 pg (ref 26.0–34.0)
MCV: 88.9 fL (ref 78.0–100.0)
Platelets: 307 10*3/uL (ref 150–400)
RBC: 4.07 MIL/uL (ref 3.87–5.11)
WBC: 6.5 10*3/uL (ref 4.0–10.5)

## 2011-04-29 LAB — URINE CULTURE: Colony Count: 25000

## 2011-04-29 LAB — URINE MICROSCOPIC-ADD ON

## 2011-04-29 LAB — MONONUCLEOSIS SCREEN: Mono Screen: NEGATIVE

## 2011-04-29 MED ORDER — FENTANYL CITRATE 0.05 MG/ML IJ SOLN
50.0000 ug | Freq: Once | INTRAMUSCULAR | Status: AC
  Administered 2011-04-29: 50 ug via INTRAVENOUS
  Filled 2011-04-29: qty 2

## 2011-04-29 MED ORDER — CIPROFLOXACIN IN D5W 400 MG/200ML IV SOLN
400.0000 mg | Freq: Once | INTRAVENOUS | Status: AC
  Administered 2011-04-29: 400 mg via INTRAVENOUS
  Filled 2011-04-29: qty 200

## 2011-04-29 MED ORDER — CIPROFLOXACIN HCL 500 MG PO TABS: 500.0000 mg | ORAL_TABLET | Freq: Two times a day (BID) | ORAL | Status: AC

## 2011-04-29 MED ORDER — ONDANSETRON HCL 4 MG/2ML IJ SOLN
4.0000 mg | Freq: Once | INTRAMUSCULAR | Status: AC
  Administered 2011-04-29: 4 mg via INTRAVENOUS
  Filled 2011-04-29: qty 2

## 2011-04-29 MED ORDER — METRONIDAZOLE 500 MG PO TABS
2000.0000 mg | ORAL_TABLET | Freq: Once | ORAL | Status: AC
  Administered 2011-04-29: 2000 mg via ORAL
  Filled 2011-04-29: qty 4

## 2011-05-31 ENCOUNTER — Emergency Department (HOSPITAL_COMMUNITY)
Admission: EM | Admit: 2011-05-31 | Discharge: 2011-06-01 | Disposition: A | Discharge status: Home / Self Care | Payer: Medicare Other | Attending: Emergency Medicine | Admitting: Emergency Medicine

## 2011-05-31 ENCOUNTER — Encounter (HOSPITAL_COMMUNITY): Payer: Self-pay

## 2011-05-31 DIAGNOSIS — R062 Wheezing: Secondary | ICD-10-CM | POA: Insufficient documentation

## 2011-05-31 DIAGNOSIS — R109 Unspecified abdominal pain: Secondary | ICD-10-CM | POA: Insufficient documentation

## 2011-05-31 DIAGNOSIS — F17200 Nicotine dependence, unspecified, uncomplicated: Secondary | ICD-10-CM | POA: Insufficient documentation

## 2011-05-31 DIAGNOSIS — Z79899 Other long term (current) drug therapy: Secondary | ICD-10-CM | POA: Insufficient documentation

## 2011-05-31 DIAGNOSIS — R05 Cough: Secondary | ICD-10-CM | POA: Insufficient documentation

## 2011-05-31 DIAGNOSIS — R1110 Vomiting, unspecified: Secondary | ICD-10-CM | POA: Insufficient documentation

## 2011-05-31 DIAGNOSIS — R059 Cough, unspecified: Secondary | ICD-10-CM | POA: Insufficient documentation

## 2011-05-31 MED ORDER — MORPHINE SULFATE 4 MG/ML IJ SOLN
4.0000 mg | Freq: Once | INTRAMUSCULAR | Status: AC
  Administered 2011-06-01: 4 mg via INTRAVENOUS
  Filled 2011-05-31: qty 1

## 2011-05-31 MED ORDER — ONDANSETRON HCL 4 MG/2ML IJ SOLN
4.0000 mg | Freq: Once | INTRAMUSCULAR | Status: AC
  Administered 2011-06-01: 4 mg via INTRAVENOUS
  Filled 2011-05-31: qty 2

## 2011-05-31 MED ORDER — SODIUM CHLORIDE 0.9 % IV SOLN: INTRAVENOUS | Status: DC

## 2011-05-31 NOTE — ED Notes (Signed)
Pt c/o RUQ and LUQ pain that began Saturday.

## 2011-06-01 ENCOUNTER — Emergency Department (HOSPITAL_COMMUNITY): Payer: Medicare Other

## 2011-06-01 LAB — DIFFERENTIAL
Basophils Absolute: 0 10*3/uL (ref 0.0–0.1)
Basophils Relative: 0 % (ref 0–1)
Eosinophils Absolute: 0.1 10*3/uL (ref 0.0–0.7)
Eosinophils Relative: 1 % (ref 0–5)
Lymphocytes Relative: 11 % — ABNORMAL LOW (ref 12–46)
Lymphs Abs: 1.1 10*3/uL (ref 0.7–4.0)
Monocytes Absolute: 0.6 10*3/uL (ref 0.1–1.0)
Monocytes Relative: 6 % (ref 3–12)
Neutro Abs: 8.5 10*3/uL — ABNORMAL HIGH (ref 1.7–7.7)
Neutrophils Relative %: 83 % — ABNORMAL HIGH (ref 43–77)

## 2011-06-01 LAB — URINALYSIS, ROUTINE W REFLEX MICROSCOPIC
Glucose, UA: 500 mg/dL — AB
Ketones, ur: 15 mg/dL — AB
Leukocytes, UA: NEGATIVE
Nitrite: NEGATIVE
Protein, ur: >300 mg/dL — AB
Specific Gravity, Urine: 1.033 — ABNORMAL HIGH (ref 1.005–1.030)
Urobilinogen, UA: 0.2 mg/dL (ref 0.0–1.0)
pH: 6 (ref 5.0–8.0)

## 2011-06-01 LAB — COMPREHENSIVE METABOLIC PANEL
ALT: 11 U/L (ref 0–35)
AST: 13 U/L (ref 0–37)
Albumin: 1.2 g/dL — ABNORMAL LOW (ref 3.5–5.2)
Alkaline Phosphatase: 64 U/L (ref 39–117)
BUN: 15 mg/dL (ref 6–23)
CO2: 26 mEq/L (ref 19–32)
Calcium: 8.1 mg/dL — ABNORMAL LOW (ref 8.4–10.5)
Chloride: 105 mEq/L (ref 96–112)
Creatinine, Ser: 0.99 mg/dL (ref 0.50–1.10)
GFR calc Af Amer: 80 mL/min — ABNORMAL LOW (ref >90)
GFR calc non Af Amer: 69 mL/min — ABNORMAL LOW (ref >90)
Glucose, Bld: 126 mg/dL — ABNORMAL HIGH (ref 70–99)
Potassium: 4.3 mEq/L (ref 3.5–5.1)
Sodium: 136 mEq/L (ref 135–145)
Total Bilirubin: 0.2 mg/dL — ABNORMAL LOW (ref 0.3–1.2)
Total Protein: 4.3 g/dL — ABNORMAL LOW (ref 6.0–8.3)

## 2011-06-01 LAB — CBC
Platelets: 307 10*3/uL (ref 150–400)
RBC: 4.19 MIL/uL (ref 3.87–5.11)
WBC: 10.5 10*3/uL (ref 4.0–10.5)

## 2011-06-01 LAB — URINE MICROSCOPIC-ADD ON

## 2011-06-01 LAB — LIPASE, BLOOD: Lipase: 14 U/L (ref 11–59)

## 2011-06-01 MED ORDER — SODIUM CHLORIDE 0.9 % IV SOLN
Freq: Once | INTRAVENOUS | Status: AC
  Administered 2011-06-01: 125 mL/h via INTRAVENOUS

## 2011-06-01 MED ORDER — SODIUM CHLORIDE 0.9 % IV BOLUS (SEPSIS)
500.0000 mL | Freq: Once | INTRAVENOUS | Status: AC
  Administered 2011-06-01: 500 mL via INTRAVENOUS

## 2011-06-01 MED ORDER — ONDANSETRON HCL 4 MG PO TABS: 4.0000 mg | ORAL_TABLET | Freq: Four times a day (QID) | ORAL | Status: DC

## 2011-06-01 MED ORDER — HYDROMORPHONE HCL PF 1 MG/ML IJ SOLN: 0.5000 mg | Freq: Once | INTRAMUSCULAR | Status: DC

## 2011-06-01 MED ORDER — HYDROCODONE-ACETAMINOPHEN 5-325 MG PO TABS: 1.0000 | ORAL_TABLET | ORAL | Status: AC | PRN

## 2011-06-01 NOTE — ED Notes (Signed)
Pt states that she has a history of gallstones. Pt states that she is having abdominal pain that is upper abdomen. Pt states that the pain is right sided and normally there with gallstones. Pt states that the left upper abdomen pain is new and it is sharp. Pt states that the pain is constant and dull. Pt denies any problems with bowel movement pt alert and oriented bowel sounds present.

## 2011-06-01 NOTE — ED Provider Notes (Signed)
History     CSN: [id number]  Arrival date & time 05/31/11  2306   First MD Initiated Contact with Patient 05/31/11 2347      Chief Complaint  Patient presents with  . Emesis    (Consider location/radiation/quality/duration/timing/severity/associated sxs/prior treatment) Patient is a 41 y.o. female presenting with vomiting and abdominal pain. The history is provided by the patient. The history is limited by the condition of the patient.  Emesis  The current episode started 6 to 12 hours ago. The problem occurs continuously. The problem has not changed since onset.Associated symptoms include abdominal pain, chills and cough. Pertinent negatives include no fever.  Abdominal Pain The primary symptoms of the illness include abdominal pain and vomiting. The primary symptoms of the illness do not include fever, shortness of breath, nausea or dysuria. The current episode started 6 to 12 hours ago. The onset of the illness was gradual. The problem has not changed since onset. The abdominal pain is located in the RUQ and epigastric region. The abdominal pain does not radiate. The abdominal pain is relieved by nothing. The abdominal pain is exacerbated by vomiting.  The patient states that she believes she is currently not pregnant. The patient has not had a change in bowel habit. Additional symptoms associated with the illness include chills. Associated symptoms comments: She reports known gall stones for one year that feels the same. She also has a cough and reports chills without known fever. She denies that the RUQ pain she is having is worse with deep breath or cough. No bloody emesis..    Past Medical History  Diagnosis Date  . Diabetes mellitus   . High cholesterol     Past Surgical History  Procedure Date  . Cesarean section   . Breast surgery   . Eye surgery     No family history on file.  History  Substance Use Topics  . Smoking status: Current Everyday Smoker -- 2.0 packs/day   . Smokeless tobacco: Not on file  . Alcohol Use: No    OB History    Grav Para Term Preterm Abortions TAB SAB Ect Mult Living                  Review of Systems  Constitutional: Positive for chills. Negative for fever.  HENT: Negative.   Respiratory: Positive for cough. Negative for shortness of breath.   Cardiovascular: Negative.   Gastrointestinal: Positive for vomiting and abdominal pain. Negative for nausea.  Genitourinary: Negative for dysuria.  Musculoskeletal: Negative.   Skin: Negative.   Neurological: Negative.     Allergies  Ciprofloxacin  Home Medications   Current Outpatient Rx  Name Route Sig Dispense Refill  . CYANOCOBALAMIN PO Oral Take 1 tablet by mouth daily.    Marland Kitchen GLIPIZIDE 5 MG PO TABS Oral Take 5 mg by mouth 2 (two) times daily before a meal.      . IBUPROFEN 200 MG PO TABS Oral Take 200 mg by mouth every 6 (six) hours as needed. For pain    . METFORMIN HCL 1000 MG PO TABS Oral Take 1,000 mg by mouth 2 (two) times daily.        BP 142/90  Pulse 118  Temp(Src) 98.7 F (37.1 C) (Oral)  Resp 16  SpO2 98%  LMP 05/17/2011  Physical Exam  Constitutional: She appears well-developed and well-nourished.  HENT:  Head: Normocephalic.  Neck: Normal range of motion. Neck supple.  Cardiovascular: Normal rate and regular rhythm.  Pulmonary/Chest: Effort normal and breath sounds normal.  Abdominal: Soft. She exhibits no mass. Bowel sounds are decreased. There is tenderness in the right upper quadrant. There is no rebound and no guarding.    Musculoskeletal: Normal range of motion. She exhibits edema.  Neurological: She is alert. No cranial nerve deficit.  Skin: Skin is warm and dry.  Psychiatric: She has a normal mood and affect.    ED Course  Procedures (including critical care time)   Labs Reviewed  CBC  COMPREHENSIVE METABOLIC PANEL  LIPASE, BLOOD  URINALYSIS, ROUTINE W REFLEX MICROSCOPIC  DIFFERENTIAL   No results found.   No  diagnosis found.    MDM          Rodena Medin, PA-C 06/01/11 0037  Rodena Medin, PA-C 06/01/11 0105  Medical screening examination/treatment/procedure(s) were performed by non-physician practitioner and as supervising physician I was immediately available for consultation/collaboration.   Sunnie Nielsen, MD 06/01/11 [phone]

## 2011-06-01 NOTE — Discharge Instructions (Signed)

## 2011-06-01 NOTE — ED Provider Notes (Signed)
History     CSN: [id number]  Arrival date & time 05/31/11  2306   First MD Initiated Contact with Patient 05/31/11 2347      Chief Complaint  Patient presents with  . Emesis    (Consider location/radiation/quality/duration/timing/severity/associated sxs/prior treatment) HPI  Past Medical History  Diagnosis Date  . Diabetes mellitus   . High cholesterol     Past Surgical History  Procedure Date  . Cesarean section   . Breast surgery   . Eye surgery     No family history on file.  History  Substance Use Topics  . Smoking status: Current Everyday Smoker -- 2.0 packs/day  . Smokeless tobacco: Not on file  . Alcohol Use: No    OB History    Grav Para Term Preterm Abortions TAB SAB Ect Mult Living                  Review of Systems  Allergies  Ciprofloxacin  Home Medications   Current Outpatient Rx  Name Route Sig Dispense Refill  . CYANOCOBALAMIN PO Oral Take 1 tablet by mouth daily.    Marland Kitchen GLIPIZIDE 5 MG PO TABS Oral Take 5 mg by mouth 2 (two) times daily before a meal.      . IBUPROFEN 200 MG PO TABS Oral Take 200 mg by mouth every 6 (six) hours as needed. For pain    . METFORMIN HCL 1000 MG PO TABS Oral Take 1,000 mg by mouth 2 (two) times daily.        BP 100/66  Pulse 73  Temp(Src) 98.7 F (37.1 C) (Oral)  Resp 16  SpO2 94%  LMP 05/17/2011  Physical Exam  ED Course  Procedures (including critical care time)  Labs Reviewed  COMPREHENSIVE METABOLIC PANEL - Abnormal; Notable for the following:    Glucose, Bld 126 (*)    Calcium 8.1 (*)    Total Protein 4.3 (*)    Albumin 1.2 (*)    Total Bilirubin 0.2 (*)    GFR calc non Af Amer 69 (*)    GFR calc Af Amer 80 (*)    All other components within normal limits  URINALYSIS, ROUTINE W REFLEX MICROSCOPIC - Abnormal; Notable for the following:    APPearance CLOUDY (*)    Specific Gravity, Urine 1.033 (*)    Glucose, UA 500 (*)    Hgb urine dipstick LARGE (*)    Bilirubin Urine SMALL (*)    Ketones, ur 15 (*)    Protein, ur >300 (*)    All other components within normal limits  DIFFERENTIAL - Abnormal; Notable for the following:    Neutrophils Relative 83 (*)    Neutro Abs 8.5 (*)    Lymphocytes Relative 11 (*)    All other components within normal limits  URINE MICROSCOPIC-ADD ON - Abnormal; Notable for the following:    Squamous Epithelial / LPF FEW (*)    Bacteria, UA FEW (*)    Casts GRANULAR CAST (*)    All other components within normal limits  CBC  LIPASE, BLOOD   Dg Chest 2 View  06/01/2011  *RADIOLOGY REPORT*  Clinical Data: Vomiting and wheezing.  Cough.  CHEST - 2 VIEW  Comparison: Two-view chest 03/20/2011.  Findings: Perihilar bronchitic changes are evident bilaterally.  No focal airspace disease is evident.  The heart size is normal.  IMPRESSION: New bronchitic changes compatible with acute bronchitis.  Original Report Authenticated By: Jamesetta Orleans. MATTERN, M.D.   US Abdomen  Complete  06/01/2011  *RADIOLOGY REPORT*  Clinical Data:  Emesis for 3 days  COMPLETE ABDOMINAL ULTRASOUND  Comparison:  PET CT 02/10/2010  Findings:  Gallbladder:  No gallstones, gallbladder wall thickening, or pericholecystic fluid.  Common bile duct:  No bile duct dilatation.  Common bile duct measures 4.5 mm diameter.  Liver:  No focal lesion identified.  Within normal limits in parenchymal echogenicity.  IVC:  Appears normal.  Pancreas:  The pancreas is mostly obscured by overlying bowel gas is poorly visualized.  Spleen:  Spleen length measures 9 cm.  Normal homogeneous echotexture.  Right Kidney:  Right kidney measures 13.6 cm.  No hydronephrosis.  Left Kidney:  Left kidney measures 13.3 cm diameter.  Septated cystic lesion off of the lower pole measuring 5.2 x 3.5 x 4 cm. This represents a minimally complex cyst, likely benign.  Abdominal aorta:  Mid and distal aorta is obscured by bowel gas. Visualized proximal aorta is not dilated.  IMPRESSION: Minimally complex left renal cyst.   Visualized abdominal organs are otherwise unremarkable.  Limited study due to bowel gas.  Original Report Authenticated By: Marlon Pel, M.D.   Results for orders placed during the hospital encounter of 05/31/11  CBC      Component Value Range   WBC 10.5  4.0 - 10.5 (K/uL)   RBC 4.19  3.87 - 5.11 (MIL/uL)   Hemoglobin 12.7  12.0 - 15.0 (g/dL)   HCT 21.3  08.6 - 57.8 (%)   MCV 90.0  78.0 - 100.0 (fL)   MCH 30.3  26.0 - 34.0 (pg)   MCHC 33.7  30.0 - 36.0 (g/dL)   RDW 46.9  62.9 - 52.8 (%)   Platelets 307  150 - 400 (K/uL)  COMPREHENSIVE METABOLIC PANEL      Component Value Range   Sodium 136  135 - 145 (mEq/L)   Potassium 4.3  3.5 - 5.1 (mEq/L)   Chloride 105  96 - 112 (mEq/L)   CO2 26  19 - 32 (mEq/L)   Glucose, Bld 126 (*) 70 - 99 (mg/dL)   BUN 15  6 - 23 (mg/dL)   Creatinine, Ser 4.13  0.50 - 1.10 (mg/dL)   Calcium 8.1 (*) 8.4 - 10.5 (mg/dL)   Total Protein 4.3 (*) 6.0 - 8.3 (g/dL)   Albumin 1.2 (*) 3.5 - 5.2 (g/dL)   AST 13  0 - 37 (U/L)   ALT 11  0 - 35 (U/L)   Alkaline Phosphatase 64  39 - 117 (U/L)   Total Bilirubin 0.2 (*) 0.3 - 1.2 (mg/dL)   GFR calc non Af Amer 69 (*) >90 (mL/min)   GFR calc Af Amer 80 (*) >90 (mL/min)  LIPASE, BLOOD      Component Value Range   Lipase 14  11 - 59 (U/L)  URINALYSIS, ROUTINE W REFLEX MICROSCOPIC      Component Value Range   Color, Urine YELLOW  YELLOW    APPearance CLOUDY (*) CLEAR    Specific Gravity, Urine 1.033 (*) 1.005 - 1.030    pH 6.0  5.0 - 8.0    Glucose, UA 500 (*) NEGATIVE (mg/dL)   Hgb urine dipstick LARGE (*) NEGATIVE    Bilirubin Urine SMALL (*) NEGATIVE    Ketones, ur 15 (*) NEGATIVE (mg/dL)   Protein, ur >244 (*) NEGATIVE (mg/dL)   Urobilinogen, UA 0.2  0.0 - 1.0 (mg/dL)   Nitrite NEGATIVE  NEGATIVE    Leukocytes, UA NEGATIVE  NEGATIVE   DIFFERENTIAL  Component Value Range   Neutrophils Relative 83 (*) 43 - 77 (%)   Neutro Abs 8.5 (*) 1.7 - 7.7 (K/uL)   Lymphocytes Relative 11 (*) 12 - 46 (%)    Lymphs Abs 1.1  0.7 - 4.0 (K/uL)   Monocytes Relative 6  3 - 12 (%)   Monocytes Absolute 0.6  0.1 - 1.0 (K/uL)   Eosinophils Relative 1  0 - 5 (%)   Eosinophils Absolute 0.1  0.0 - 0.7 (K/uL)   Basophils Relative 0  0 - 1 (%)   Basophils Absolute 0.0  0.0 - 0.1 (K/uL)  URINE MICROSCOPIC-ADD ON      Component Value Range   Squamous Epithelial / LPF FEW (*) RARE    RBC / HPF 11-20  <3 (RBC/hpf)   Bacteria, UA FEW (*) RARE    Casts GRANULAR CAST (*) NEGATIVE    Dg Chest 2 View  06/01/2011  *RADIOLOGY REPORT*  Clinical Data: Vomiting and wheezing.  Cough.  CHEST - 2 VIEW  Comparison: Two-view chest 03/20/2011.  Findings: Perihilar bronchitic changes are evident bilaterally.  No focal airspace disease is evident.  The heart size is normal.  IMPRESSION: New bronchitic changes compatible with acute bronchitis.  Original Report Authenticated By: Jamesetta Orleans. MATTERN, M.D.   US Abdomen Complete  06/01/2011  *RADIOLOGY REPORT*  Clinical Data:  Emesis for 3 days  COMPLETE ABDOMINAL ULTRASOUND  Comparison:  PET CT 02/10/2010  Findings:  Gallbladder:  No gallstones, gallbladder wall thickening, or pericholecystic fluid.  Common bile duct:  No bile duct dilatation.  Common bile duct measures 4.5 mm diameter.  Liver:  No focal lesion identified.  Within normal limits in parenchymal echogenicity.  IVC:  Appears normal.  Pancreas:  The pancreas is mostly obscured by overlying bowel gas is poorly visualized.  Spleen:  Spleen length measures 9 cm.  Normal homogeneous echotexture.  Right Kidney:  Right kidney measures 13.6 cm.  No hydronephrosis.  Left Kidney:  Left kidney measures 13.3 cm diameter.  Septated cystic lesion off of the lower pole measuring 5.2 x 3.5 x 4 cm. This represents a minimally complex cyst, likely benign.  Abdominal aorta:  Mid and distal aorta is obscured by bowel gas. Visualized proximal aorta is not dilated.  IMPRESSION: Minimally complex left renal cyst.  Visualized abdominal organs are  otherwise unremarkable.  Limited study due to bowel gas.  Original Report Authenticated By: Marlon Pel, M.D.       Abdominal pain    MDM  I have taken over care of this patient from Horris Latino, PA-C - please see her note for HPI, ROS and PE.  Patient presents with approximately a year history of RUQ abdominal pain with emesis, states has been seen by PCP, but states has not ever seen GI, will refer to GI now.  Abdomen soft with diffuse upper tenderness but no evidence of surgical issue.  Labs stable, continues with glucose in urine and blood but negative for leukocytes or nitrites.  I suspect this may be a gastroparesis issue and again will refer to GI, no acute findings.  Will discharge with nausea and pain medication.        Izola Price [place], [place] 06/01/11 [phone]  Medical screening examination/treatment/procedure(s) were performed by non-physician practitioner and as supervising physician I was immediately available for consultation/collaboration.  Sunnie Nielsen, MD 06/01/11 [phone]

## 2011-06-09 ENCOUNTER — Emergency Department (HOSPITAL_COMMUNITY): Payer: Medicare Other

## 2011-06-09 ENCOUNTER — Inpatient Hospital Stay (HOSPITAL_COMMUNITY)
Admission: EM | Admit: 2011-06-09 | Discharge: 2011-06-15 | DRG: 292 | Disposition: A | Discharge status: Home / Self Care | Payer: Medicare Other | Attending: Family Medicine | Admitting: Family Medicine

## 2011-06-09 ENCOUNTER — Encounter (HOSPITAL_COMMUNITY): Payer: Self-pay

## 2011-06-09 DIAGNOSIS — I10 Essential (primary) hypertension: Secondary | ICD-10-CM | POA: Diagnosis present

## 2011-06-09 DIAGNOSIS — Z833 Family history of diabetes mellitus: Secondary | ICD-10-CM

## 2011-06-09 DIAGNOSIS — R748 Abnormal levels of other serum enzymes: Secondary | ICD-10-CM | POA: Diagnosis present

## 2011-06-09 DIAGNOSIS — E11319 Type 2 diabetes mellitus with unspecified diabetic retinopathy without macular edema: Secondary | ICD-10-CM | POA: Diagnosis present

## 2011-06-09 DIAGNOSIS — L989 Disorder of the skin and subcutaneous tissue, unspecified: Secondary | ICD-10-CM | POA: Diagnosis present

## 2011-06-09 DIAGNOSIS — R601 Generalized edema: Secondary | ICD-10-CM

## 2011-06-09 DIAGNOSIS — N049 Nephrotic syndrome with unspecified morphologic changes: Secondary | ICD-10-CM | POA: Diagnosis present

## 2011-06-09 DIAGNOSIS — J189 Pneumonia, unspecified organism: Secondary | ICD-10-CM | POA: Diagnosis present

## 2011-06-09 DIAGNOSIS — R918 Other nonspecific abnormal finding of lung field: Secondary | ICD-10-CM

## 2011-06-09 DIAGNOSIS — Z720 Tobacco use: Secondary | ICD-10-CM

## 2011-06-09 DIAGNOSIS — E1165 Type 2 diabetes mellitus with hyperglycemia: Secondary | ICD-10-CM | POA: Diagnosis present

## 2011-06-09 DIAGNOSIS — F17200 Nicotine dependence, unspecified, uncomplicated: Secondary | ICD-10-CM | POA: Diagnosis present

## 2011-06-09 DIAGNOSIS — I5031 Acute diastolic (congestive) heart failure: Principal | ICD-10-CM | POA: Diagnosis present

## 2011-06-09 DIAGNOSIS — E1139 Type 2 diabetes mellitus with other diabetic ophthalmic complication: Secondary | ICD-10-CM | POA: Diagnosis present

## 2011-06-09 DIAGNOSIS — R809 Proteinuria, unspecified: Secondary | ICD-10-CM

## 2011-06-09 DIAGNOSIS — I509 Heart failure, unspecified: Secondary | ICD-10-CM | POA: Diagnosis present

## 2011-06-09 DIAGNOSIS — C3490 Malignant neoplasm of unspecified part of unspecified bronchus or lung: Secondary | ICD-10-CM | POA: Diagnosis present

## 2011-06-09 HISTORY — DX: Polyneuropathy, unspecified: G62.9

## 2011-06-09 LAB — COMPREHENSIVE METABOLIC PANEL
ALT: 12 U/L (ref 0–35)
Calcium: 7.9 mg/dL — ABNORMAL LOW (ref 8.4–10.5)
Creatinine, Ser: 1.12 mg/dL — ABNORMAL HIGH (ref 0.50–1.10)
GFR calc Af Amer: 69 mL/min — ABNORMAL LOW (ref >90)
GFR calc non Af Amer: 60 mL/min — ABNORMAL LOW (ref >90)
Glucose, Bld: 233 mg/dL — ABNORMAL HIGH (ref 70–99)
Sodium: 135 mEq/L (ref 135–145)
Total Protein: 4.7 g/dL — ABNORMAL LOW (ref 6.0–8.3)

## 2011-06-09 LAB — POCT I-STAT, CHEM 8
BUN: 23 mg/dL (ref 6–23)
Calcium, Ion: 1.13 mmol/L (ref 1.12–1.32)
HCT: 34 % — ABNORMAL LOW (ref 36.0–46.0)
Hemoglobin: 11.6 g/dL — ABNORMAL LOW (ref 12.0–15.0)
Sodium: 138 mEq/L (ref 135–145)
TCO2: 24 mmol/L (ref 0–100)

## 2011-06-09 LAB — DIFFERENTIAL
Basophils Absolute: 0 10*3/uL (ref 0.0–0.1)
Eosinophils Absolute: 0.2 10*3/uL (ref 0.0–0.7)
Eosinophils Relative: 2 % (ref 0–5)
Lymphs Abs: 2.4 10*3/uL (ref 0.7–4.0)
Monocytes Absolute: 0.4 10*3/uL (ref 0.1–1.0)

## 2011-06-09 LAB — CBC
HCT: 36.3 % (ref 36.0–46.0)
MCH: 30.2 pg (ref 26.0–34.0)
MCV: 89.2 fL (ref 78.0–100.0)
Platelets: 396 10*3/uL (ref 150–400)
RDW: 13.6 % (ref 11.5–15.5)

## 2011-06-09 LAB — PROTEIN, URINE, 24 HOUR

## 2011-06-09 LAB — CARDIAC PANEL(CRET KIN+CKTOT+MB+TROPI)
Relative Index: 3.6 — ABNORMAL HIGH (ref 0.0–2.5)
Total CK: 205 U/L — ABNORMAL HIGH (ref 7–177)
Troponin I: <0.3 ng/mL (ref <0.30)

## 2011-06-09 LAB — GLUCOSE, CAPILLARY: Glucose-Capillary: 216 mg/dL — ABNORMAL HIGH (ref 70–99)

## 2011-06-09 LAB — PRO B NATRIURETIC PEPTIDE: Pro B Natriuretic peptide (BNP): 6603 pg/mL — ABNORMAL HIGH (ref 0–125)

## 2011-06-09 MED ORDER — AZITHROMYCIN 500 MG IV SOLR
500.0000 mg | INTRAVENOUS | Status: DC
  Administered 2011-06-10: 500 mg via INTRAVENOUS
  Filled 2011-06-09 (×2): qty 500

## 2011-06-09 MED ORDER — SODIUM CHLORIDE 0.9 % IJ SOLN
3.0000 mL | Freq: Two times a day (BID) | INTRAMUSCULAR | Status: DC
  Administered 2011-06-10 – 2011-06-14 (×10): 3 mL via INTRAVENOUS

## 2011-06-09 MED ORDER — FUROSEMIDE 8 MG/ML PO SOLN
40.0000 mg | ORAL | Status: AC
  Administered 2011-06-09: 40 mg via ORAL
  Filled 2011-06-09: qty 5

## 2011-06-09 MED ORDER — CEFTRIAXONE SODIUM 1 G IJ SOLR
1.0000 g | Freq: Once | INTRAMUSCULAR | Status: AC
  Administered 2011-06-09: 1 g via INTRAVENOUS
  Filled 2011-06-09: qty 10

## 2011-06-09 MED ORDER — ACETAMINOPHEN 650 MG RE SUPP: 650.0000 mg | Freq: Four times a day (QID) | RECTAL | Status: DC | PRN

## 2011-06-09 MED ORDER — ACETAMINOPHEN 325 MG PO TABS: 650.0000 mg | ORAL_TABLET | Freq: Four times a day (QID) | ORAL | Status: DC | PRN

## 2011-06-09 MED ORDER — ALBUTEROL SULFATE (5 MG/ML) 0.5% IN NEBU
2.5000 mg | INHALATION_SOLUTION | Freq: Four times a day (QID) | RESPIRATORY_TRACT | Status: DC
  Administered 2011-06-10 (×2): 2.5 mg via RESPIRATORY_TRACT
  Filled 2011-06-09 (×3): qty 0.5

## 2011-06-09 MED ORDER — DEXTROSE 5 % IV SOLN
500.0000 mg | Freq: Once | INTRAVENOUS | Status: AC
  Administered 2011-06-09: 500 mg via INTRAVENOUS
  Filled 2011-06-09: qty 500

## 2011-06-09 MED ORDER — HYDROCODONE-ACETAMINOPHEN 5-325 MG PO TABS: 1.0000 | ORAL_TABLET | ORAL | Status: DC | PRN

## 2011-06-09 MED ORDER — ALBUTEROL SULFATE (5 MG/ML) 0.5% IN NEBU: 2.5000 mg | INHALATION_SOLUTION | RESPIRATORY_TRACT | Status: DC | PRN

## 2011-06-09 MED ORDER — ENOXAPARIN SODIUM 60 MG/0.6ML ~~LOC~~ SOLN
55.0000 mg | SUBCUTANEOUS | Status: DC
  Administered 2011-06-10 – 2011-06-11 (×2): 55 mg via SUBCUTANEOUS
  Administered 2011-06-12: 09:00:00 via SUBCUTANEOUS
  Administered 2011-06-13 – 2011-06-15 (×3): 55 mg via SUBCUTANEOUS
  Filled 2011-06-09 (×7): qty 0.6

## 2011-06-09 MED ORDER — FUROSEMIDE 10 MG/ML IJ SOLN
40.0000 mg | Freq: Every day | INTRAMUSCULAR | Status: DC
  Filled 2011-06-09: qty 4

## 2011-06-09 MED ORDER — INSULIN ASPART 100 UNIT/ML ~~LOC~~ SOLN
0.0000 [IU] | Freq: Every day | SUBCUTANEOUS | Status: DC
  Administered 2011-06-10 – 2011-06-13 (×3): 2 [IU] via SUBCUTANEOUS
  Administered 2011-06-14: 3 [IU] via SUBCUTANEOUS

## 2011-06-09 MED ORDER — IOHEXOL 300 MG/ML  SOLN
100.0000 mL | Freq: Once | INTRAMUSCULAR | Status: AC | PRN
  Administered 2011-06-09: 100 mL via INTRAVENOUS

## 2011-06-09 MED ORDER — ONDANSETRON HCL 4 MG PO TABS: 4.0000 mg | ORAL_TABLET | Freq: Four times a day (QID) | ORAL | Status: DC | PRN

## 2011-06-09 MED ORDER — NICOTINE 21 MG/24HR TD PT24
21.0000 mg | MEDICATED_PATCH | Freq: Every day | TRANSDERMAL | Status: DC
Start: 2011-06-10 — End: 2011-06-10

## 2011-06-09 MED ORDER — DEXTROSE 5 % IV SOLN
1.0000 g | INTRAVENOUS | Status: DC
  Administered 2011-06-10: 1 g via INTRAVENOUS
  Filled 2011-06-09 (×2): qty 10

## 2011-06-09 MED ORDER — ONDANSETRON HCL 4 MG/2ML IJ SOLN: 4.0000 mg | Freq: Four times a day (QID) | INTRAMUSCULAR | Status: DC | PRN

## 2011-06-09 MED ORDER — IPRATROPIUM BROMIDE 0.02 % IN SOLN
0.5000 mg | Freq: Four times a day (QID) | RESPIRATORY_TRACT | Status: DC
  Administered 2011-06-10 (×2): 0.5 mg via RESPIRATORY_TRACT
  Filled 2011-06-09 (×3): qty 2.5

## 2011-06-09 MED ORDER — SENNA 8.6 MG PO TABS: 2.0000 | ORAL_TABLET | Freq: Every evening | ORAL | Status: DC | PRN

## 2011-06-09 MED ORDER — INSULIN ASPART 100 UNIT/ML ~~LOC~~ SOLN
0.0000 [IU] | Freq: Three times a day (TID) | SUBCUTANEOUS | Status: DC
  Administered 2011-06-10: 2 [IU] via SUBCUTANEOUS
  Administered 2011-06-10: 1 [IU] via SUBCUTANEOUS
  Administered 2011-06-10: 5 [IU] via SUBCUTANEOUS
  Administered 2011-06-11: 2 [IU] via SUBCUTANEOUS
  Administered 2011-06-11: 5 [IU] via SUBCUTANEOUS
  Administered 2011-06-11: 2 [IU] via SUBCUTANEOUS
  Administered 2011-06-12: 3 [IU] via SUBCUTANEOUS
  Administered 2011-06-12: 2 [IU] via SUBCUTANEOUS
  Administered 2011-06-12: 3 [IU] via SUBCUTANEOUS
  Administered 2011-06-13: 5 [IU] via SUBCUTANEOUS
  Administered 2011-06-13: 2 [IU] via SUBCUTANEOUS
  Administered 2011-06-13: 3 [IU] via SUBCUTANEOUS
  Administered 2011-06-14: 2 [IU] via SUBCUTANEOUS
  Administered 2011-06-14: 5 [IU] via SUBCUTANEOUS
  Administered 2011-06-14 – 2011-06-15 (×3): 3 [IU] via SUBCUTANEOUS
  Filled 2011-06-09 (×2): qty 3

## 2011-06-09 NOTE — ED Notes (Signed)
Patient denies pain and is resting comfortably.  

## 2011-06-09 NOTE — ED Notes (Signed)
Admit Doctor at bedside.  

## 2011-06-09 NOTE — H&P (Addendum)
PCP:   No primary provider on file.   Chief Complaint:  Generalized body swelling, dyspnea, intermittent nausea and vomiting and right upper quadrant abdominal pain.  HPI: 41 year old Caucasian female patient with history of long-standing type 2 diabetes mellitus, bilateral diabetic retinopathy status post laser eye surgery and possible vitrectomy, limited vision right greater than left eye, hyperlipidemia, ongoing tobacco abuse presents to the emergency department with above complaints. She indicates that she has noticed gradually worsening generalized body swelling including her lower extremities, upper extremities, abdomen and even her eyes over the last 2-3 weeks. This is associated with orthopnea and patient wakes up at night coughing. Cough has minimal white sputum. She denies chest pain or palpitations. She denies fevers or chills.  Intermittent nausea and vomiting has been ongoing for a year and a half. She says some days she does not have a these symptoms but other days units for 2-3 times consisting of food that she has consumed or liquids. No coffee grounds or blood. Usually constipated. Has never had EGD or colonoscopy. She also complained of right upper quadrant abdominal pain since beginning of this year. She saw a gastroenterologist, Dr. Jeani Hawking this morning who advised her that this was most likely costochondritis. This pain is worse with coughing and laying on the right side. She has weight gain.  Patient has a lesion underneath her right big toe. She cannot tell how long she's had it. However towards the late last year apparently it started bleeding and since then she has intermittent bleeding from that site but no pain. She denies lancing or cutting it. She indicates that she thought it was a callus.  ED evaluation revealed CT angiogram of the chest negative for pulmonary embolism but enlarging left lower lobe lung mass with possible postobstructive pneumonia. Patient indicates  that she had a PET scan through her primary care physician late last year and was told that there was minimal uptake suggesting of scar tissue. She was also noted to have pro BNP greater than 6000.  Past Medical History: Past Medical History  Diagnosis Date  . Diabetes mellitus   . High cholesterol     Past Surgical History: Past Surgical History  Procedure Date  . Cesarean section   . Breast surgery   . Eye surgery     Allergies:   Allergies  Allergen Reactions  . Ciprofloxacin Rash    Medications: Prior to Admission medications   Medication Sig Start Date End Date Taking? Authorizing Provider  CYANOCOBALAMIN PO Take 1 tablet by mouth daily.   Yes Historical Provider, MD  glipiZIDE (GLUCOTROL) 5 MG tablet Take 5 mg by mouth 2 (two) times daily before a meal.     Yes Historical Provider, MD  HYDROcodone-acetaminophen (NORCO) 5-325 MG per tablet Take 1 tablet by mouth every 4 (four) hours as needed for pain. 06/01/11 06/11/11 Yes Scarlette Calico C. Sanford, PA  ibuprofen (ADVIL,MOTRIN) 200 MG tablet Take 200 mg by mouth every 6 (six) hours as needed. For pain   Yes Historical Provider, MD  metFORMIN (GLUCOPHAGE) 1000 MG tablet Take 1,000 mg by mouth 2 (two) times daily.     Yes Historical Provider, MD  ondansetron (ZOFRAN-ODT) 4 MG disintegrating tablet Take 4 mg by mouth every 8 (eight) hours as needed. For nausea   Yes Historical Provider, MD    Family History: Father has history of diabetes, hyperlipidemia, hypertension, COPD and heart disease.  Social History:  reports that she has been smoking.  She does not  have any smokeless tobacco history on file. She reports that she does not drink alcohol or use illicit drugs. she smokes 1-1/2 pack a cigarettes per day for the last 25 years. She denies alcohol or drug abuse. She lives with her spouse who is at the bedside and is independent of activities of daily living.  Review of Systems:  All systems reviewed and apart from history of  presenting illness, is negative.  Physical Exam: Filed Vitals:   06/09/11 1131 06/09/11 1405 06/09/11 1846  BP: 138/96 108/55 146/92  Pulse: 149 134 102  Temp: 98.2 F (36.8 C)    TempSrc: Oral    Resp: 20 18 16   Height: 5\' 7"  (1.702 m)    Weight: 111.131 kg (245 lb)    SpO2: 98% 97% 98%   General appearance: Moderately built and obese female patient who is lying comfortably propped up in the gurney and is in no obvious distress. Head: Nontraumatic and normocephalic.  Eyes: Pupils equally reacting to light and accommodation. Right eye visual acuity is limited to perception of light. Ears: Normal Nose: No acute findings. No sinus tenderness. Throat: Mucosa is moist. No oral thrush. Neck: Supple. No JVD or carotid bruit. Resp: Reduced breath sounds in the bases, left greater than the right with a few basal crackles. Rest of the lung fields appear clear. No increased work of breathing. Cardio: First and second heart sounds heard, regular and mildly tachycardic. No murmurs or JVD or gallop. 3+ pitting bilateral lower extremity edema extending all the way to the anterior abdominal and mid back posteriorly. GI: Obese. Pitting edema of anterior abdominal wall which has significant pannus. Soft and nontender. No organomegaly or masses appreciated. Normal bowel sounds heard. Extremities: 3+ pitting edema of all extremities, lower extremities greater than upper extremities. Patchy reddish rash on both her legs but no other acute findings suggestive of cellulitis. No increased warmth or tenderness. Patient has approximately 1 cm area of darkish discolored hard scab-like lesion under the right first toe which is no acute findings suggestive of active infection. Skin: No other acute findings. Lymph nodes: No lymphadenopathy. Neurologic: Alert and oriented. No focal neurological deficits.   Labs on Admission:   [facility] 06/09/11 1326 06/09/11 1302  NA 138 135  K 4.2 4.2  CL 108 106  CO2 -- 23    GLUCOSE 236* 233*  BUN 23 23  CREATININE 0.90 1.12*  CALCIUM -- 7.9*  MG -- --  PHOS -- --    Basename 06/09/11 1302  AST 14  ALT 12  ALKPHOS 79  BILITOT 0.1*  PROT 4.7*  ALBUMIN 0.9*   No results found for this basename: LIPASE:2,AMYLASE:2 in the last 72 hours  Basename 06/09/11 1326 06/09/11 1302  WBC -- 9.6  NEUTROABS -- 6.5  HGB 11.6* 12.3  HCT 34.0* 36.3  MCV -- 89.2  PLT -- 396    Basename 06/09/11 1302  CKTOTAL --  CKMB --  CKMBINDEX --  TROPONINI <0.30   No results found for this basename: TSH,T4TOTAL,FREET3,T3FREE,THYROIDAB in the last 72 hours No results found for this basename: VITAMINB12:2,FOLATE:2,FERRITIN:2,TIBC:2,IRON:2,RETICCTPCT:2 in the last 72 hours  Radiological Exams on Admission: Dg Chest 2 View  06/01/2011  *RADIOLOGY REPORT*  Clinical Data: Vomiting and wheezing.  Cough.  CHEST - 2 VIEW  Comparison: Two-view chest 03/20/2011.  Findings: Perihilar bronchitic changes are evident bilaterally.  No focal airspace disease is evident.  The heart size is normal.  IMPRESSION: New bronchitic changes compatible with acute bronchitis.  Original Report Authenticated By: Jamesetta Orleans. MATTERN, M.D.   Ct Angio Chest W/cm &/or Wo Cm  06/09/2011  *RADIOLOGY REPORT*  Clinical Data: 41 year old female with shortness of breath, rapid heart rate, swelling.  CT ANGIOGRAPHY CHEST  Technique:  Multidetector CT imaging of the chest using the standard protocol during bolus administration of intravenous contrast. Multiplanar reconstructed images including MIPs were obtained and reviewed to evaluate the vascular anatomy.  Contrast: OMNIPAQUE IOHEXOL 300 MG/ML IV SOLN  Comparison: Chest radiograph from the same day and earlier.  PET-CT 02/10/2010.  Findings: Good contrast bolus timing in the pulmonary arterial tree.  Intermittent respiratory motion artifact. No focal filling defect identified in the pulmonary arterial tree to suggest the presence of acute pulmonary  embolism.  Medial basal segment left lower lobe lung mass has mildly increased in size since 2011, now 24 x 21 mm (20 x 17 mm previously at comparable level).  There is superimposed tree in bud nodularity in the right middle lobe, and some peripheral airway nodularity also the lesion.  Major airways are patent.  There is lower lobe peribronchial thickening, including in the airway leading to the left lower lobe mass.  In the abdomen there is new ascites adjacent to the liver and spleen.  Trace cholelithiasis.  Adrenal glands and other visualized upper abdominal viscera are stable.  No pericardial or pleural effusion.  Increased bilateral hilar nodal tissue measures up to 9 mm in short axis.  It is unclear what extent as was present in 2011.  Smaller prevascular, precarinal, and the right paratracheal lymph nodes are noted.  Negative thyroid and thoracic inlet.  Coronary artery calcifications.  No suspicious osseous lesion.  IMPRESSION: 1. No evidence of acute pulmonary embolus. 2.  Chronic left lower lobe lung mass has increased 4-5 mm in each axial dimension since 2011.  Indolent neoplasm is favored and recommend histologic evaluation of this lesion when possible. 3.  Superimposed acute distal airway infection suspected in the right middle lobe and lingula.   If the patient fails to respond to standard therapy, consider atypical entities such as MAC. 4.  Increased number of hilar and mediastinal lymph nodes, difficult to compare to the noncontrast CT portion of the 2011 study. These could be reactive, but pathology findings in #2 will guide follow-up.  5.  New small volume ascites in the upper abdomen.  Study discussed by telephone with Dr. Mancel Bale on 06/09/2011 at 1650 hours.  Original Report Authenticated By: Harley Hallmark, M.D.   US Abdomen Complete  06/01/2011  *RADIOLOGY REPORT*  Clinical Data:  Emesis for 3 days  COMPLETE ABDOMINAL ULTRASOUND  Comparison:  PET CT 02/10/2010  Findings:  Gallbladder:   No gallstones, gallbladder wall thickening, or pericholecystic fluid.  Common bile duct:  No bile duct dilatation.  Common bile duct measures 4.5 mm diameter.  Liver:  No focal lesion identified.  Within normal limits in parenchymal echogenicity.  IVC:  Appears normal.  Pancreas:  The pancreas is mostly obscured by overlying bowel gas is poorly visualized.  Spleen:  Spleen length measures 9 cm.  Normal homogeneous echotexture.  Right Kidney:  Right kidney measures 13.6 cm.  No hydronephrosis.  Left Kidney:  Left kidney measures 13.3 cm diameter.  Septated cystic lesion off of the lower pole measuring 5.2 x 3.5 x 4 cm. This represents a minimally complex cyst, likely benign.  Abdominal aorta:  Mid and distal aorta is obscured by bowel gas. Visualized proximal aorta is not dilated.  IMPRESSION: Minimally complex left renal cyst.  Visualized abdominal organs are otherwise unremarkable.  Limited study due to bowel gas.  Original Report Authenticated By: Marlon Pel, M.D.   Dg Chest Port 1 View  06/09/2011  *RADIOLOGY REPORT*  Clinical Data: Cough.  Smoking history.  PORTABLE CHEST - 1 VIEW  Comparison: T 03/2012  Findings: Artifact overlies the chest.  There is central bronchial thickening consists of bronchitis.  There is mild patchy infiltrate in the left lower lobe consistent with mild pneumonia.  No effusions.  No bony abnormalities.  IMPRESSION: Bronchitis.  Patchy infiltrate left lower lobe consistent with pneumonia.  Original Report Authenticated By: Thomasenia Sales, M.D.   EKG: Unable to find EKG on Epic at this time. Per ED physician, rate of 142 per minute, sinus tachycardia, normal QRS axis and intervals and no ST-T wave abnormalities or conduction disturbances. Apart from faster heart rate then prior EKG, seems to be unchanged.   Assessment/Plan Present on Admission:  .Anasarca .PNA (pneumonia) .Lung mass .Diastolic CHF, acute .Tobacco abuse .Diabetic retinopathy associated with type 2  diabetes mellitus  1. Anasarca: Differential diagnosis includes nephrotic syndrome secondary to diabetic nephropathy versus acute diastolic congestive heart failure. Evaluate for underlying cause. Intravenous Lasix. Monitor strict input output and daily weights. We'll check 24-hour urine for proteins, fasting lipids and 2-D echocardiogram without contrast. 2. Dyspnea secondary to a right middle lobe and lingualar community acquired pneumonia and acute diastolic congestive heart failure. Continue IV Lasix, IV Rocephin and Zithromax. Monitor. 3. Left lower lobe lung mass which is increasing in size compared to last imaging late last year, with associated hilar and mediastinal lymphadenopathy: Suspicious for malignancy. Please consult pulmonology tomorrow for further evaluation which may include bronchoscopy and biopsy. 4. Tobacco abuse: Cessation counseled. Patient was offered a nicotine patch which she is agreeable to. 5. 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 Check hemoglobin A1c. Place on sliding scale insulin and monitor. 6. Possible hypertension: Monitor blood pressures closely and consider starting ACE inhibitors. Hesitant to start ACE inhibitor  right now because of recent contrast and diuresis. Blood pressure are not significantly high. 7. Right upper quadrant abdominal pain:? Costochondritis versus passive hepatic congestion secondary to heart failure. Abdomen exam is benign. Monitor. We'll cycle cardiac enzymes. 8. Lesion on the right first toe: Unclear etiology. ? Trauma over prior callus. Consider outpatient dermatology versus podiatry consultation. 9. Minimally complex left renal cyst: Outpatient evaluation as deemed necessary. 10. Full code  Addendum:  EKG at 11:37 AM showed sinus tachycardia with heart rate of 142 beats per minute, normal axis, low-voltage QRS, and nonspecific ST-T changes with QTC 535  ms.  Karma Ansley 06/09/2011, 6:58 PM

## 2011-06-09 NOTE — ED Notes (Signed)
Not able to retrieve urine sample.  Mixed with feces.

## 2011-06-09 NOTE — ED Provider Notes (Signed)
History     CSN: [id number]  Arrival date & time 06/09/11  1111   First MD Initiated Contact with Patient 06/09/11 1210      Chief Complaint  Patient presents with  . Facial Swelling    (Consider location/radiation/quality/duration/timing/severity/associated sxs/prior treatment) HPI Comments: Patient comes in today with a chief complaint of bilateral upper extremity and lower extremity swelling for the past 2-3 weeks.  She reports that the edema is slowly getting worse.  She also has noticed a pruitic erythematous rash on the lower extremities bilaterally.  The rash developed after the swelling.  Patient denies history of CHF or renal disease.  Patient has been seen in the Emergency Department in the past for edema.  Patient reports that she has not had a recent Echocardiogram.  She does not have a cardiologist.  She is currently not on any diuretics.  She also has a sore on the bottom of her right great toe that she states has been there for months.  The history is provided by the patient.    Past Medical History  Diagnosis Date  . Diabetes mellitus   . High cholesterol     Past Surgical History  Procedure Date  . Cesarean section   . Breast surgery   . Eye surgery     No family history on file.  History  Substance Use Topics  . Smoking status: Current Everyday Smoker -- 2.0 packs/day  . Smokeless tobacco: Not on file  . Alcohol Use: No    OB History    Grav Para Term Preterm Abortions TAB SAB Ect Mult Living                  Review of Systems  Constitutional: Negative for fever and chills.  Eyes: Negative for visual disturbance.  Respiratory: Positive for shortness of breath. Negative for cough.   Cardiovascular: Positive for leg swelling. Negative for chest pain.  Gastrointestinal: Positive for abdominal distention. Negative for nausea, vomiting and abdominal pain.  Genitourinary: Negative for dysuria, enuresis and difficulty urinating.  Skin: Positive for  rash.  Neurological: Negative for dizziness, syncope and light-headedness.    Allergies  Ciprofloxacin  Home Medications   Current Outpatient Rx  Name Route Sig Dispense Refill  . CYANOCOBALAMIN PO Oral Take 1 tablet by mouth daily.    Marland Kitchen GLIPIZIDE 5 MG PO TABS Oral Take 5 mg by mouth 2 (two) times daily before a meal.      . HYDROCODONE-ACETAMINOPHEN 5-325 MG PO TABS Oral Take 1 tablet by mouth every 4 (four) hours as needed for pain. 20 tablet 0  . IBUPROFEN 200 MG PO TABS Oral Take 200 mg by mouth every 6 (six) hours as needed. For pain    . METFORMIN HCL 1000 MG PO TABS Oral Take 1,000 mg by mouth 2 (two) times daily.      Marland Kitchen ONDANSETRON 4 MG PO TBDP Oral Take 4 mg by mouth every 8 (eight) hours as needed. For nausea      BP 108/55  Pulse 134  Temp(Src) 98.2 F (36.8 C) (Oral)  Resp 18  Ht 5\' 7"  (1.702 m)  Wt 245 lb (111.131 kg)  BMI 38.37 kg/m2  SpO2 97%  LMP 05/21/2011  Physical Exam  Nursing note and vitals reviewed. Constitutional: She is oriented to person, place, and time. She appears well-developed and well-nourished. No distress.  HENT:  Head: Normocephalic and atraumatic.  Eyes: EOM are normal. Pupils are equal, round, and reactive to  light.  Neck: Normal range of motion. Neck supple.  Cardiovascular: Regular rhythm and normal heart sounds.  Tachycardia present.        2+ pitting edema of lower extremities. 2+ edema of hands.    Pulmonary/Chest: Effort normal and breath sounds normal. No respiratory distress. She has no wheezes.  Abdominal: Soft. Bowel sounds are normal. She exhibits distension. She exhibits no mass. There is no tenderness. There is no rebound and no guarding.  Musculoskeletal: Normal range of motion.  Neurological: She is alert and oriented to person, place, and time. She has normal strength. No cranial nerve deficit or sensory deficit.  Skin: She is not diaphoretic.       Diabetic ulcer of the great toe with necrosis. Erythematous papular  rash with scaly dry skin on the lower extremities bilaterally.  Psychiatric: She has a normal mood and affect.    ED Course  Procedures (including critical care time)  Labs Reviewed  COMPREHENSIVE METABOLIC PANEL - Abnormal; Notable for the following:    Glucose, Bld 233 (*)    Creatinine, Ser 1.12 (*)    Calcium 7.9 (*)    Total Protein 4.7 (*)    Albumin 0.9 (*)    Total Bilirubin 0.1 (*)    GFR calc non Af Amer 60 (*)    GFR calc Af Amer 69 (*)    All other components within normal limits  PRO B NATRIURETIC PEPTIDE - Abnormal; Notable for the following:    Pro B Natriuretic peptide (BNP) 6603.0 (*)    All other components within normal limits  GLUCOSE, CAPILLARY - Abnormal; Notable for the following:    Glucose-Capillary 216 (*)    All other components within normal limits  POCT I-STAT, CHEM 8 - Abnormal; Notable for the following:    Glucose, Bld 236 (*)    Hemoglobin 11.6 (*)    HCT 34.0 (*)    All other components within normal limits  CBC  DIFFERENTIAL  TROPONIN I  URINALYSIS, ROUTINE W REFLEX MICROSCOPIC   Dg Chest Port 1 View  06/09/2011  *RADIOLOGY REPORT*  Clinical Data: Cough.  Smoking history.  PORTABLE CHEST - 1 VIEW  Comparison: T 03/2012  Findings: Artifact overlies the chest.  There is central bronchial thickening consists of bronchitis.  There is mild patchy infiltrate in the left lower lobe consistent with mild pneumonia.  No effusions.  No bony abnormalities.  IMPRESSION: Bronchitis.  Patchy infiltrate left lower lobe consistent with pneumonia.  Original Report Authenticated By: Thomasenia Sales, M.D.     No diagnosis found.   Date: 06/09/2011  Rate: 142  Rhythm: sinus tachycardia  QRS Axis: normal  Intervals: normal  ST/T Wave abnormalities: normal  Conduction Disutrbances:none  Narrative Interpretation:   Old EKG Reviewed: EKG shows faster HR than last EKG, but otherwise unchanged. Patient also discussed with Dr. Rubin Payor who also saw  patient. Patient given 40mg  IV Lasix while in the ED. Patient with tachycardia and some shortness of breath.  Therefore, CT angiogram was ordered to rule out PE. CXR showing pneumonia.  Patient lives at home so feel that pneumonia is CAP. Patient started on Azithromycin and Ceftriaxone in the ED. 3:47 PM Patient signed out to Sharen Hones, NP who assumes care of patient in the ED.   MDM  Patient with lower extremity and upper extremity edema.  No prior history of renal disease or CHF.  Currently not on diuretics.  BNP in the Emergency Department was [phone].  Chest  xray shows pneumonia.  Patient started on IV antibiotics.  Patient awaiting CT angiogram to rule out PE.  Sharen Hones, NP will follow up on results of the CT angio.        Pascal Lux Houghton, PA-C 06/09/11 [phone]

## 2011-06-09 NOTE — ED Notes (Signed)
Pat, RN covering for Brooke, RN/Greg, RN for lunch break.  

## 2011-06-09 NOTE — ED Notes (Signed)
Pt to be admitted. C/o sx since new years but states generalized edema and dyspnea with exertion began 2 weeks ago. Denies pain. Pt in no acute distress, will cont to monitor.

## 2011-06-09 NOTE — ED Notes (Signed)
Pt presents with 2-3 week h/o generalized edema.   Pt recently diagnosed with costochondritis and UTI.

## 2011-06-09 NOTE — ED Notes (Signed)
Attempted to call report.  Nurse will call me back  

## 2011-06-09 NOTE — ED Provider Notes (Signed)
History     CSN: [id number]  Arrival date & time 06/09/11  1111   First MD Initiated Contact with Patient 06/09/11 1210      Chief Complaint  Patient presents with  . Facial Swelling    (Consider location/radiation/quality/duration/timing/severity/associated sxs/prior treatment) HPI  Past Medical History  Diagnosis Date  . Diabetes mellitus   . High cholesterol     Past Surgical History  Procedure Date  . Cesarean section   . Breast surgery   . Eye surgery     No family history on file.  History  Substance Use Topics  . Smoking status: Current Everyday Smoker -- 2.0 packs/day  . Smokeless tobacco: Not on file  . Alcohol Use: No    OB History    Grav Para Term Preterm Abortions TAB SAB Ect Mult Living                  Review of Systems  Allergies  Ciprofloxacin  Home Medications   Current Outpatient Rx  Name Route Sig Dispense Refill  . CYANOCOBALAMIN PO Oral Take 1 tablet by mouth daily.    Marland Kitchen GLIPIZIDE 5 MG PO TABS Oral Take 5 mg by mouth 2 (two) times daily before a meal.      . HYDROCODONE-ACETAMINOPHEN 5-325 MG PO TABS Oral Take 1 tablet by mouth every 4 (four) hours as needed for pain. 20 tablet 0  . IBUPROFEN 200 MG PO TABS Oral Take 200 mg by mouth every 6 (six) hours as needed. For pain    . METFORMIN HCL 1000 MG PO TABS Oral Take 1,000 mg by mouth 2 (two) times daily.      Marland Kitchen ONDANSETRON 4 MG PO TBDP Oral Take 4 mg by mouth every 8 (eight) hours as needed. For nausea      BP 108/55  Pulse 134  Temp(Src) 98.2 F (36.8 C) (Oral)  Resp 18  Ht 5\' 7"  (1.702 m)  Wt 245 lb (111.131 kg)  BMI 38.37 kg/m2  SpO2 97%  LMP 05/21/2011  Physical Exam  ED Course  Procedures (including critical care time)  Labs Reviewed  COMPREHENSIVE METABOLIC PANEL - Abnormal; Notable for the following:    Glucose, Bld 233 (*)    Creatinine, Ser 1.12 (*)    Calcium 7.9 (*)    Total Protein 4.7 (*)    Albumin 0.9 (*)    Total Bilirubin 0.1 (*)    GFR calc  non Af Amer 60 (*)    GFR calc Af Amer 69 (*)    All other components within normal limits  PRO B NATRIURETIC PEPTIDE - Abnormal; Notable for the following:    Pro B Natriuretic peptide (BNP) 6603.0 (*)    All other components within normal limits  GLUCOSE, CAPILLARY - Abnormal; Notable for the following:    Glucose-Capillary 216 (*)    All other components within normal limits  POCT I-STAT, CHEM 8 - Abnormal; Notable for the following:    Glucose, Bld 236 (*)    Hemoglobin 11.6 (*)    HCT 34.0 (*)    All other components within normal limits  CBC  DIFFERENTIAL  TROPONIN I  URINALYSIS, ROUTINE W REFLEX MICROSCOPIC   Ct Angio Chest W/cm &/or Wo Cm  06/09/2011  *RADIOLOGY REPORT*  Clinical Data: 41 year old female with shortness of breath, rapid heart rate, swelling.  CT ANGIOGRAPHY CHEST  Technique:  Multidetector CT imaging of the chest using the standard protocol during bolus administration of intravenous contrast.  Multiplanar reconstructed images including MIPs were obtained and reviewed to evaluate the vascular anatomy.  Contrast: OMNIPAQUE IOHEXOL 300 MG/ML IV SOLN  Comparison: Chest radiograph from the same day and earlier.  PET-CT 02/10/2010.  Findings: Good contrast bolus timing in the pulmonary arterial tree.  Intermittent respiratory motion artifact. No focal filling defect identified in the pulmonary arterial tree to suggest the presence of acute pulmonary embolism.  Medial basal segment left lower lobe lung mass has mildly increased in size since 2011, now 24 x 21 mm (20 x 17 mm previously at comparable level).  There is superimposed tree in bud nodularity in the right middle lobe, and some peripheral airway nodularity also the lesion.  Major airways are patent.  There is lower lobe peribronchial thickening, including in the airway leading to the left lower lobe mass.  In the abdomen there is new ascites adjacent to the liver and spleen.  Trace cholelithiasis.  Adrenal glands and  other visualized upper abdominal viscera are stable.  No pericardial or pleural effusion.  Increased bilateral hilar nodal tissue measures up to 9 mm in short axis.  It is unclear what extent as was present in 2011.  Smaller prevascular, precarinal, and the right paratracheal lymph nodes are noted.  Negative thyroid and thoracic inlet.  Coronary artery calcifications.  No suspicious osseous lesion.  IMPRESSION: 1. No evidence of acute pulmonary embolus. 2.  Chronic left lower lobe lung mass has increased 4-5 mm in each axial dimension since 2011.  Indolent neoplasm is favored and recommend histologic evaluation of this lesion when possible. 3.  Superimposed acute distal airway infection suspected in the right middle lobe and lingula.   If the patient fails to respond to standard therapy, consider atypical entities such as MAC. 4.  Increased number of hilar and mediastinal lymph nodes, difficult to compare to the noncontrast CT portion of the 2011 study. These could be reactive, but pathology findings in #2 will guide follow-up.  5.  New small volume ascites in the upper abdomen.  Study discussed by telephone with Dr. Mancel Bale on 06/09/2011 at 1650 hours.  Original Report Authenticated By: Harley Hallmark, M.D.   Dg Chest Port 1 View  06/09/2011  *RADIOLOGY REPORT*  Clinical Data: Cough.  Smoking history.  PORTABLE CHEST - 1 VIEW  Comparison: T 03/2012  Findings: Artifact overlies the chest.  There is central bronchial thickening consists of bronchitis.  There is mild patchy infiltrate in the left lower lobe consistent with mild pneumonia.  No effusions.  No bony abnormalities.  IMPRESSION: Bronchitis.  Patchy infiltrate left lower lobe consistent with pneumonia.  Original Report Authenticated By: Thomasenia Sales, M.D.     1. Pneumonia   2. Lung mass       MDM  SOB peripheral swelling         Arman Filter, NP 06/09/11 1752

## 2011-06-09 NOTE — ED Notes (Signed)
4703-01 Ready 

## 2011-06-09 NOTE — ED Notes (Addendum)
Talked to  Dr. Waymon Amato admitting doctor, told about heart rate 130 asymptomatic, sinus tach, no fever. No new orders

## 2011-06-09 NOTE — ED Notes (Signed)
PA in room assessing pt. Pt states she has "been feeling bad for last few weeks" She states the generalized swelling started 2-3 weeks ago and is worse in her RLE. She has a black/brown penny size crusted area to her right great toe. Pt also had c/o itching and pain under both breast. Areas under breast red.

## 2011-06-10 ENCOUNTER — Other Ambulatory Visit: Payer: Self-pay

## 2011-06-10 ENCOUNTER — Encounter (HOSPITAL_COMMUNITY): Payer: Self-pay | Admitting: General Practice

## 2011-06-10 DIAGNOSIS — R748 Abnormal levels of other serum enzymes: Secondary | ICD-10-CM

## 2011-06-10 DIAGNOSIS — R222 Localized swelling, mass and lump, trunk: Secondary | ICD-10-CM

## 2011-06-10 DIAGNOSIS — F17200 Nicotine dependence, unspecified, uncomplicated: Secondary | ICD-10-CM

## 2011-06-10 DIAGNOSIS — I509 Heart failure, unspecified: Secondary | ICD-10-CM

## 2011-06-10 LAB — LIPID PANEL
HDL: 31 mg/dL — ABNORMAL LOW (ref >39)
LDL Cholesterol: 233 mg/dL — ABNORMAL HIGH (ref 0–99)
Total CHOL/HDL Ratio: 10.3 RATIO
Triglycerides: 275 mg/dL — ABNORMAL HIGH (ref <150)

## 2011-06-10 LAB — DRUGS OF ABUSE SCREEN W/O ALC, ROUTINE URINE
Barbiturate Quant, Ur: NEGATIVE
Benzodiazepines.: NEGATIVE
Cocaine Metabolites: NEGATIVE
Creatinine,U: 77.6 mg/dL
Phencyclidine (PCP): NEGATIVE

## 2011-06-10 LAB — TSH: TSH: 1.215 u[IU]/mL (ref 0.350–4.500)

## 2011-06-10 LAB — CARDIAC PANEL(CRET KIN+CKTOT+MB+TROPI)
CK, MB: 6.1 ng/mL (ref 0.3–4.0)
Troponin I: <0.3 ng/mL (ref <0.30)

## 2011-06-10 LAB — CBC
HCT: 37.9 % (ref 36.0–46.0)
Hemoglobin: 12.6 g/dL (ref 12.0–15.0)
MCH: 29.4 pg (ref 26.0–34.0)
MCHC: 33.2 g/dL (ref 30.0–36.0)

## 2011-06-10 LAB — BASIC METABOLIC PANEL
BUN: 19 mg/dL (ref 6–23)
Chloride: 106 mEq/L (ref 96–112)
Glucose, Bld: 162 mg/dL — ABNORMAL HIGH (ref 70–99)
Potassium: 3.6 mEq/L (ref 3.5–5.1)

## 2011-06-10 LAB — GLUCOSE, CAPILLARY
Glucose-Capillary: 156 mg/dL — ABNORMAL HIGH (ref 70–99)
Glucose-Capillary: 188 mg/dL — ABNORMAL HIGH (ref 70–99)
Glucose-Capillary: 274 mg/dL — ABNORMAL HIGH (ref 70–99)

## 2011-06-10 LAB — MAGNESIUM
Magnesium: 1.7 mg/dL (ref 1.5–2.5)
Magnesium: 1.5 mg/dL (ref 1.5–2.5)

## 2011-06-10 LAB — PHOSPHORUS: Phosphorus: 4 mg/dL (ref 2.3–4.6)

## 2011-06-10 LAB — HEMOGLOBIN A1C: Mean Plasma Glucose: 200 mg/dL — ABNORMAL HIGH (ref <117)

## 2011-06-10 MED ORDER — METOPROLOL TARTRATE 25 MG PO TABS: 25.0000 mg | ORAL_TABLET | Freq: Two times a day (BID) | ORAL | Status: DC

## 2011-06-10 MED ORDER — FUROSEMIDE 40 MG PO TABS
40.0000 mg | ORAL_TABLET | Freq: Two times a day (BID) | ORAL | Status: DC
  Administered 2011-06-10 – 2011-06-15 (×10): 40 mg via ORAL
  Filled 2011-06-10 (×12): qty 1

## 2011-06-10 MED ORDER — ALBUTEROL SULFATE (5 MG/ML) 0.5% IN NEBU: 2.5000 mg | INHALATION_SOLUTION | RESPIRATORY_TRACT | Status: DC | PRN

## 2011-06-10 MED ORDER — ASPIRIN 325 MG PO TABS
325.0000 mg | ORAL_TABLET | Freq: Every day | ORAL | Status: DC
  Administered 2011-06-10 – 2011-06-15 (×6): 325 mg via ORAL
  Filled 2011-06-10 (×6): qty 1

## 2011-06-10 MED ORDER — METOPROLOL TARTRATE 25 MG PO TABS
25.0000 mg | ORAL_TABLET | Freq: Two times a day (BID) | ORAL | Status: DC
  Administered 2011-06-10 – 2011-06-12 (×6): 25 mg via ORAL
  Filled 2011-06-10 (×8): qty 1

## 2011-06-10 MED ORDER — IPRATROPIUM BROMIDE 0.02 % IN SOLN: 0.5000 mg | RESPIRATORY_TRACT | Status: DC | PRN

## 2011-06-10 MED ORDER — POTASSIUM CHLORIDE CRYS ER 20 MEQ PO TBCR
20.0000 meq | EXTENDED_RELEASE_TABLET | Freq: Two times a day (BID) | ORAL | Status: DC
  Administered 2011-06-10 – 2011-06-15 (×11): 20 meq via ORAL
  Filled 2011-06-10 (×12): qty 1

## 2011-06-10 MED ORDER — INFLUENZA VIRUS VACC SPLIT PF IM SUSP
0.5000 mL | Freq: Once | INTRAMUSCULAR | Status: AC
  Administered 2011-06-10: 0.5 mL via INTRAMUSCULAR
  Filled 2011-06-10: qty 0.5

## 2011-06-10 MED ORDER — NICOTINE 21 MG/24HR TD PT24
21.0000 mg | MEDICATED_PATCH | Freq: Every day | TRANSDERMAL | Status: DC
  Administered 2011-06-10 – 2011-06-15 (×6): 21 mg via TRANSDERMAL
  Filled 2011-06-10 (×6): qty 1

## 2011-06-10 NOTE — Plan of Care (Signed)
Problem: Consults Goal: Heart Failure Patient Education (See Patient Education module for education specifics.) Outcome: Progressing Given Heart Failure booklet- Dietician visited pt about low Sodium diet T Print production planner

## 2011-06-10 NOTE — Progress Notes (Signed)
Utilization review complete 

## 2011-06-10 NOTE — Consult Note (Signed)
HPI: 41 year old female with no prior cardiac history for evaluation of elevated troponin and possible congestive heart failure. Patient states that she has had mild pedal edema for approximately 2 months. Beginning in January she had fevers, chills, productive cough and nausea/vomiting. Over the past week she has had increasing dyspnea on exertion, cough increased with lying flat and improved with sitting up, and diffuse edema. She has had some pain in her right upper quadrant that increases with cough and with lying in certain positions. She denies chest pain. Patient admitted and is noted to have a lung mass. Workup in progress. Troponin mildly elevated. Cardiology asked to evaluate.  Medications Prior to Admission  Medication Dose Route Frequency Provider Last Rate Last Dose  . acetaminophen (TYLENOL) tablet 650 mg  650 mg Oral Q6H PRN Marcellus Scott, MD       Or  . acetaminophen (TYLENOL) suppository 650 mg  650 mg Rectal Q6H PRN Marcellus Scott, MD      . albuterol (PROVENTIL) (5 MG/ML) 0.5% nebulizer solution 2.5 mg  2.5 mg Nebulization Q2H PRN Marcellus Scott, MD      . albuterol (PROVENTIL) (5 MG/ML) 0.5% nebulizer solution 2.5 mg  2.5 mg Nebulization Q6H Marcellus Scott, MD   2.5 mg at 06/10/11 0753  . aspirin tablet 325 mg  325 mg Oral Daily Penny Pia, MD      . azithromycin (ZITHROMAX) 500 mg in dextrose 5 % 250 mL IVPB  500 mg Intravenous Once Pascal Lux Wingen, PA-C   500 mg at 06/09/11 1448  . azithromycin (ZITHROMAX) 500 mg in dextrose 5 % 250 mL IVPB  500 mg Intravenous Q24H Marcellus Scott, MD      . cefTRIAXone (ROCEPHIN) 1 g in dextrose 5 % 50 mL IVPB  1 g Intravenous Once [employer], PA-C   1 g at 06/09/11 1630  . cefTRIAXone (ROCEPHIN) 1 g in dextrose 5 % 50 mL IVPB  1 g Intravenous Q24H Marcellus Scott, MD      . enoxaparin (LOVENOX) injection 55 mg  55 mg Subcutaneous Q24H Marcellus Scott, MD   55 mg at 06/10/11 0837  . furosemide (LASIX) 8 MG/ML solution 40 mg  40 mg  Oral To Major Magnus Sinning, PA-C   40 mg at 06/09/11 1447  . HYDROcodone-acetaminophen (NORCO) 5-325 MG per tablet 1-2 tablet  1-2 tablet Oral Q4H PRN Marcellus Scott, MD      . influenza  inactive virus vaccine (FLUZONE/FLUARIX) injection 0.5 mL  0.5 mL Intramuscular Once Penny Pia, MD      . insulin aspart (novoLOG) injection 0-5 Units  0-5 Units Subcutaneous QHS Marcellus Scott, MD      . insulin aspart (novoLOG) injection 0-9 Units  0-9 Units Subcutaneous TID WC Marcellus Scott, MD   1 Units at 06/10/11 [phone]  . iohexol (OMNIPAQUE) 300 MG/ML solution 100 mL  100 mL Intravenous Once PRN Medication Radiologist, MD   100 mL at 06/09/11 1629  . ipratropium (ATROVENT) nebulizer solution 0.5 mg  0.5 mg Nebulization Q6H Marcellus Scott, MD   0.5 mg at 06/10/11 0753  . nicotine (NICODERM CQ - dosed in mg/24 hours) patch 21 mg  21 mg Transdermal Daily Mariea Stable, MD   21 mg at 06/10/11 0155  . ondansetron (ZOFRAN) tablet 4 mg  4 mg Oral Q6H PRN Marcellus Scott, MD       Or  . ondansetron (ZOFRAN) injection 4 mg  4 mg Intravenous Q6H PRN Marcellus Scott, MD      .  senna (SENOKOT) tablet 17.2 mg  2 tablet Oral QHS PRN Marcellus Scott, MD      . sodium chloride 0.9 % injection 3 mL  3 mL Intravenous Q12H Marcellus Scott, MD   3 mL at 06/10/11 0040  . DISCONTD: furosemide (LASIX) injection 40 mg  40 mg Intravenous Daily Marcellus Scott, MD      . DISCONTD: nicotine (NICODERM CQ - dosed in mg/24 hours) patch 21 mg  21 mg Transdermal Daily Marcellus Scott, MD       Medications Prior to Admission  Medication Sig Dispense Refill  . CYANOCOBALAMIN PO Take 1 tablet by mouth daily.      Marland Kitchen glipiZIDE (GLUCOTROL) 5 MG tablet Take 5 mg by mouth 2 (two) times daily before a meal.        . HYDROcodone-acetaminophen (NORCO) 5-325 MG per tablet Take 1 tablet by mouth every 4 (four) hours as needed for pain.  20 tablet  0  . ibuprofen (ADVIL,MOTRIN) 200 MG tablet Take 200 mg by mouth every 6 (six) hours as needed.  For pain      . metFORMIN (GLUCOPHAGE) 1000 MG tablet Take 1,000 mg by mouth 2 (two) times daily.          Allergies  Allergen Reactions  . Ciprofloxacin Rash    Past Medical History  Diagnosis Date  . Diabetes mellitus   . High cholesterol   . Diabetic retinopathy   . Peripheral neuropathy     "tips of toes"    Past Surgical History  Procedure Date  . Cesarean section 1991; 1994  . Breast surgery   . Eye surgery   . Breast cyst incision and drainage ?1993    left  . Tubal ligation 1994  . Vitrectomy     bilaterally "right eye once; left eye twice"    History   Social History  . Marital Status: Married    Spouse Name: N/A    Number of Children: 3  . Years of Education: N/A   Occupational History  . Not on file.   Social History Main Topics  . Smoking status: Current Everyday Smoker -- 2.0 packs/day for 25 years    Types: Cigarettes  . Smokeless tobacco: Never Used  . Alcohol Use: No  . Drug Use: Yes    Special: Marijuana     "last time for marijuana ~ 1993"  . Sexually Active: Yes   Other Topics Concern  . Not on file   Social History Narrative  . No narrative on file    Family History  Problem Relation Age of Onset  . Coronary artery disease Father     ROS:  Cough productive of white sputum and rash over lower extremities but no hemoptysis, dysphasia, odynophagia, melena, hematochezia, dysuria, hematuria, seizure activity, claudication. Remaining systems are negative.  Physical Exam:   Blood pressure 132/89, pulse 134, temperature 98 F (36.7 C), temperature source Oral, resp. rate 20, height 5\' 7"  (1.702 m), weight 250 lb 7.1 oz (113.6 kg), last menstrual period 05/21/2011, SpO2 98.00%.  General:  Well developed/well nourished in NAD Skin warm/dry Patient not depressed No peripheral clubbing Back-normal HEENT-normal/normal eyelids Neck supple/normal carotid upstroke bilaterally; bilateral bruits; no JVD; no thyromegaly chest - CTA/ normal  expansion CV - RRR/normal S1 and S2; no murmurs, rubs or gallops;  PMI nondisplaced Abdomen -mildly distended, mild tenderness in the right upper quadrant, ascites noted, no hepatosplenomegaly, subcutaneous edema 2+ femoral pulses, no bruits Ext-1-2+ diffuse edema, no chords, distal  pulses not palpated Neuro- decreased vision from peripheral neuropathy  ECG sinus tachycardia with nonspecific ST changes.  Results for orders placed during the hospital encounter of 06/09/11 (from the past 48 hour(s))  CBC     Status: Normal   Collection Time   06/09/11  1:02 PM      Component Value Range Comment   WBC 9.6  4.0 - 10.5 (K/uL)    RBC 4.07  3.87 - 5.11 (MIL/uL)    Hemoglobin 12.3  12.0 - 15.0 (g/dL)    HCT 16.1  09.6 - 04.5 (%)    MCV 89.2  78.0 - 100.0 (fL)    MCH 30.2  26.0 - 34.0 (pg)    MCHC 33.9  30.0 - 36.0 (g/dL)    RDW 40.9  81.1 - 91.4 (%)    Platelets 396  150 - 400 (K/uL)   DIFFERENTIAL     Status: Normal   Collection Time   06/09/11  1:02 PM      Component Value Range Comment   Neutrophils Relative 68  43 - 77 (%)    Neutro Abs 6.5  1.7 - 7.7 (K/uL)    Lymphocytes Relative 26  12 - 46 (%)    Lymphs Abs 2.4  0.7 - 4.0 (K/uL)    Monocytes Relative 4  3 - 12 (%)    Monocytes Absolute 0.4  0.1 - 1.0 (K/uL)    Eosinophils Relative 2  0 - 5 (%)    Eosinophils Absolute 0.2  0.0 - 0.7 (K/uL)    Basophils Relative 0  0 - 1 (%)    Basophils Absolute 0.0  0.0 - 0.1 (K/uL)   COMPREHENSIVE METABOLIC PANEL     Status: Abnormal   Collection Time   06/09/11  1:02 PM      Component Value Range Comment   Sodium 135  135 - 145 (mEq/L)    Potassium 4.2  3.5 - 5.1 (mEq/L)    Chloride 106  96 - 112 (mEq/L)    CO2 23  19 - 32 (mEq/L)    Glucose, Bld 233 (*) 70 - 99 (mg/dL)    BUN 23  6 - 23 (mg/dL)    Creatinine, Ser 7.82 (*) 0.50 - 1.10 (mg/dL)    Calcium 7.9 (*) 8.4 - 10.5 (mg/dL)    Total Protein 4.7 (*) 6.0 - 8.3 (g/dL)    Albumin 0.9 (*) 3.5 - 5.2 (g/dL)    AST 14  0 - 37 (U/L)     ALT 12  0 - 35 (U/L)    Alkaline Phosphatase 79  39 - 117 (U/L)    Total Bilirubin 0.1 (*) 0.3 - 1.2 (mg/dL)    GFR calc non Af Amer 60 (*) >90 (mL/min)    GFR calc Af Amer 69 (*) >90 (mL/min)   TROPONIN I     Status: Normal   Collection Time   06/09/11  1:02 PM      Component Value Range Comment   Troponin I <0.30  <0.30 (ng/mL)   PRO B NATRIURETIC PEPTIDE     Status: Abnormal   Collection Time   06/09/11  1:02 PM      Component Value Range Comment   Pro B Natriuretic peptide (BNP) 6603.0 (*) 0 - 125 (pg/mL)   GLUCOSE, CAPILLARY     Status: Abnormal   Collection Time   06/09/11  1:04 PM      Component Value Range Comment   Glucose-Capillary 216 (*) 70 -  99 (mg/dL)   POCT I-STAT, CHEM 8     Status: Abnormal   Collection Time   06/09/11  1:26 PM      Component Value Range Comment   Sodium 138  135 - 145 (mEq/L)    Potassium 4.2  3.5 - 5.1 (mEq/L)    Chloride 108  96 - 112 (mEq/L)    BUN 23  6 - 23 (mg/dL)    Creatinine, Ser 2.13  0.50 - 1.10 (mg/dL)    Glucose, Bld 086 (*) 70 - 99 (mg/dL)    Calcium, Ion 5.78  1.12 - 1.32 (mmol/L)    TCO2 24  0 - 100 (mmol/L)    Hemoglobin 11.6 (*) 12.0 - 15.0 (g/dL)    HCT 46.9 (*) 62.9 - 46.0 (%)   PROTEIN, URINE, 24 HOUR     Status: Normal   Collection Time   06/09/11  7:13 PM      Component Value Range Comment   Urine Total Volume-UPROT URINE, RANDOM      Collection Interval-UPROT URINE, RANDOM   CORRECTED ON 02/20 AT 1920: PREVIOUSLY REPORTED AS 24   Protein, Urine 594      Protein, 24H Urine NOT CALCULATED  50 - 100 (mg/day)   TSH     Status: Normal   Collection Time   06/09/11  7:25 PM      Component Value Range Comment   TSH 1.215  0.350 - 4.500 (uIU/mL)   CARDIAC PANEL(CRET KIN+CKTOT+MB+TROPI)     Status: Abnormal   Collection Time   06/09/11  7:25 PM      Component Value Range Comment   Total CK 205 (*) 7 - 177 (U/L)    CK, MB 7.4 (*) 0.3 - 4.0 (ng/mL)    Troponin I <0.30  <0.30 (ng/mL)    Relative Index 3.6 (*) 0.0 - 2.5      GLUCOSE, CAPILLARY     Status: Abnormal   Collection Time   06/09/11  8:26 PM      Component Value Range Comment   Glucose-Capillary 146 (*) 70 - 99 (mg/dL)   MAGNESIUM     Status: Normal   Collection Time   06/09/11 11:13 PM      Component Value Range Comment   Magnesium 1.7  1.5 - 2.5 (mg/dL)   GLUCOSE, CAPILLARY     Status: Abnormal   Collection Time   06/10/11 12:51 AM      Component Value Range Comment   Glucose-Capillary 156 (*) 70 - 99 (mg/dL)    Comment 1 Notify RN     CARDIAC PANEL(CRET KIN+CKTOT+MB+TROPI)     Status: Abnormal   Collection Time   06/10/11  3:01 AM      Component Value Range Comment   Total CK 182 (*) 7 - 177 (U/L)    CK, MB 6.7 (*) 0.3 - 4.0 (ng/mL) CRITICAL VALUE NOTED.  VALUE IS CONSISTENT WITH PREVIOUSLY REPORTED AND CALLED VALUE.   Troponin I 0.44 (*) <0.30 (ng/mL)    Relative Index 3.7 (*) 0.0 - 2.5    BASIC METABOLIC PANEL     Status: Abnormal   Collection Time   06/10/11  3:01 AM      Component Value Range Comment   Sodium 135  135 - 145 (mEq/L)    Potassium 3.6  3.5 - 5.1 (mEq/L)    Chloride 106  96 - 112 (mEq/L)    CO2 24  19 - 32 (mEq/L)    Glucose, Bld  162 (*) 70 - 99 (mg/dL)    BUN 19  6 - 23 (mg/dL)    Creatinine, Ser 1.61  0.50 - 1.10 (mg/dL)    Calcium 8.2 (*) 8.4 - 10.5 (mg/dL)    GFR calc non Af Amer 71 (*) >90 (mL/min)    GFR calc Af Amer 82 (*) >90 (mL/min)   CBC     Status: Normal   Collection Time   06/10/11  3:01 AM      Component Value Range Comment   WBC 7.7  4.0 - 10.5 (K/uL)    RBC 4.29  3.87 - 5.11 (MIL/uL)    Hemoglobin 12.6  12.0 - 15.0 (g/dL)    HCT 09.6  04.5 - 40.9 (%)    MCV 88.3  78.0 - 100.0 (fL)    MCH 29.4  26.0 - 34.0 (pg)    MCHC 33.2  30.0 - 36.0 (g/dL)    RDW 81.1  91.4 - 78.2 (%)    Platelets 377  150 - 400 (K/uL)   LIPID PANEL     Status: Abnormal   Collection Time   06/10/11  3:01 AM      Component Value Range Comment   Cholesterol 319 (*) 0 - 200 (mg/dL)    Triglycerides 956 (*) <150 (mg/dL)     HDL 31 (*) >21 (mg/dL)    Total CHOL/HDL Ratio 10.3      VLDL 55 (*) 0 - 40 (mg/dL)    LDL Cholesterol 308 (*) 0 - 99 (mg/dL)   GLUCOSE, CAPILLARY     Status: Abnormal   Collection Time   06/10/11  6:40 AM      Component Value Range Comment   Glucose-Capillary 129 (*) 70 - 99 (mg/dL)    Comment 1 Notify RN     GLUCOSE, CAPILLARY     Status: Abnormal   Collection Time   06/10/11  9:36 AM      Component Value Range Comment   Glucose-Capillary 194 (*) 70 - 99 (mg/dL)     Ct Angio Chest W/cm &/or Wo Cm  06/09/2011  *RADIOLOGY REPORT*  Clinical Data: 41 year old female with shortness of breath, rapid heart rate, swelling.  CT ANGIOGRAPHY CHEST  Technique:  Multidetector CT imaging of the chest using the standard protocol during bolus administration of intravenous contrast. Multiplanar reconstructed images including MIPs were obtained and reviewed to evaluate the vascular anatomy.  Contrast: OMNIPAQUE IOHEXOL 300 MG/ML IV SOLN  Comparison: Chest radiograph from the same day and earlier.  PET-CT 02/10/2010.  Findings: Good contrast bolus timing in the pulmonary arterial tree.  Intermittent respiratory motion artifact. No focal filling defect identified in the pulmonary arterial tree to suggest the presence of acute pulmonary embolism.  Medial basal segment left lower lobe lung mass has mildly increased in size since 2011, now 24 x 21 mm (20 x 17 mm previously at comparable level).  There is superimposed tree in bud nodularity in the right middle lobe, and some peripheral airway nodularity also the lesion.  Major airways are patent.  There is lower lobe peribronchial thickening, including in the airway leading to the left lower lobe mass.  In the abdomen there is new ascites adjacent to the liver and spleen.  Trace cholelithiasis.  Adrenal glands and other visualized upper abdominal viscera are stable.  No pericardial or pleural effusion.  Increased bilateral hilar nodal tissue measures up to 9 mm in  short axis.  It is unclear what extent as was present  in 2011.  Smaller prevascular, precarinal, and the right paratracheal lymph nodes are noted.  Negative thyroid and thoracic inlet.  Coronary artery calcifications.  No suspicious osseous lesion.  IMPRESSION: 1. No evidence of acute pulmonary embolus. 2.  Chronic left lower lobe lung mass has increased 4-5 mm in each axial dimension since 2011.  Indolent neoplasm is favored and recommend histologic evaluation of this lesion when possible. 3.  Superimposed acute distal airway infection suspected in the right middle lobe and lingula.   If the patient fails to respond to standard therapy, consider atypical entities such as MAC. 4.  Increased number of hilar and mediastinal lymph nodes, difficult to compare to the noncontrast CT portion of the 2011 study. These could be reactive, but pathology findings in #2 will guide follow-up.  5.  New small volume ascites in the upper abdomen.  Study discussed by telephone with Dr. Mancel Bale on 06/09/2011 at 1650 hours.  Original Report Authenticated By: Harley Hallmark, M.D.   Dg Chest Port 1 View  06/09/2011  *RADIOLOGY REPORT*  Clinical Data: Cough.  Smoking history.  PORTABLE CHEST - 1 VIEW  Comparison: T 03/2012  Findings: Artifact overlies the chest.  There is central bronchial thickening consists of bronchitis.  There is mild patchy infiltrate in the left lower lobe consistent with mild pneumonia.  No effusions.  No bony abnormalities.  IMPRESSION: Bronchitis.  Patchy infiltrate left lower lobe consistent with pneumonia.  Original Report Authenticated By: Thomasenia Sales, M.D.    Assessment/Plan #1-volume excess/congestive heart failure-the patient is volume overloaded on examination and her BNP is elevated. I would recommend an echocardiogram to quantify LV function. Continue diuresis with Lasix and follow renal function. Note her albumin is 0.9 and decreased oncotic pressure is most likely contributing to her  diffuse edema. I agree with 24 hour urine as she well could have nephrotic syndrome. Further recommendations once LV function known. #2-elevated troponin-she is not having chest pain. Only one value elevated. Continue aspirin. No further evaluation at this point. #3-lung mass-the patient may have lung cancer. Further evaluation per primary care. She may need biopsy. #4-possible postobstructive pneumonia-continue antibiotics per primary care. #5-diabetes mellitus-management per primary care. Hyperlipidemia-the patient would benefit from a statin given the severity of her hyperlipidemia and diabetes mellitus.  Olga Millers MD 06/10/2011, 10:13 AM

## 2011-06-10 NOTE — Progress Notes (Signed)
Subjective: Pt denies any chest pain, SOB, diaphoresis or nausea.  Reportedly had elevated cardiac enzymes this AM as well as sinus tachycardia as high as 130's.  Patient was in the room sitting up drinking coffee.  Stated that her swelling has gone down this morning.  Reports that she has had abdominal discomfort for several weeks.  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 Patient was taking a baby aspirin but discontinue to do so.  Objective: Filed Vitals:   06/09/11 2259 06/10/11 0137 06/10/11 0532 06/10/11 0753  BP: 151/96  132/89   Pulse: 130  134   Temp: 98.5 F (36.9 C)  98 F (36.7 C)   TempSrc: Oral  Oral   Resp: 18  20   Height: 5\' 7"  (1.702 m)     Weight: 113.9 kg (251 lb 1.7 oz)  113.6 kg (250 lb 7.1 oz)   SpO2: 100% 96% 98% 98%   Weight change:   Intake/Output Summary (Last 24 hours) at 06/10/11 1610 Last data filed at 06/10/11 9604  Gross per 24 hour  Intake      0 ml  Output   1000 ml  Net  -1000 ml    General: Alert, awake, oriented x3, in no acute distress.  HEENT: No bruits, no goiter.  Heart: Regular rate and rhythm, without murmurs, rubs, gallops.  Lungs: no wheezes or increased WOB.  Abdomen: Soft, nontender, nondistended, positive bowel sounds.  Neuro: Grossly intact, nonfocal.   Lab Results:  Basename 06/10/11 0301 06/09/11 2313 06/09/11 1326 06/09/11 1302  NA 135 -- 138 --  K 3.6 -- 4.2 --  CL 106 -- 108 --  CO2 24 -- -- 23  GLUCOSE 162* -- 236* --  BUN 19 -- 23 --  CREATININE 0.97 -- 0.90 --  CALCIUM 8.2* -- -- 7.9*  MG -- 1.7 -- --  PHOS -- -- -- --    Basename 06/09/11 1302  AST 14  ALT 12  ALKPHOS 79  BILITOT 0.1*  PROT 4.7*  ALBUMIN 0.9*   No results found for this basename: LIPASE:2,AMYLASE:2 in the last 72 hours  Basename 06/10/11 0301 06/09/11 1326 06/09/11 1302  WBC 7.7 -- 9.6  NEUTROABS -- -- 6.5  HGB 12.6 11.6* --  HCT 37.9 34.0* --  MCV 88.3 -- 89.2  PLT 377 -- 396    Basename 06/10/11 0301 06/09/11 1925  06/09/11 1302  CKTOTAL 182* 205* --  CKMB 6.7* 7.4* --  CKMBINDEX -- -- --  TROPONINI 0.44* <0.30 <0.30   No components found with this basename: POCBNP:3 No results found for this basename: DDIMER:2 in the last 72 hours No results found for this basename: HGBA1C:2 in the last 72 hours  Basename 06/10/11 0301  CHOL 319*  HDL 31*  LDLCALC 233*  TRIG 275*  CHOLHDL 10.3  LDLDIRECT --    Basename 06/09/11 1925  TSH 1.215  T4TOTAL --  T3FREE --  THYROIDAB --   No results found for this basename: VITAMINB12:2,FOLATE:2,FERRITIN:2,TIBC:2,IRON:2,RETICCTPCT:2 in the last 72 hours  Micro Results: No results found for this or any previous visit (from the past 240 hour(s)).  Studies/Results: Ct Angio Chest W/cm &/or Wo Cm  06/09/2011  *RADIOLOGY REPORT*  Clinical Data: 41 year old female with shortness of breath, rapid heart rate, swelling.  CT ANGIOGRAPHY CHEST  Technique:  Multidetector CT imaging of the chest using the standard protocol during bolus administration of intravenous contrast. Multiplanar reconstructed images including MIPs were obtained and reviewed to evaluate the vascular anatomy.  Contrast: OMNIPAQUE IOHEXOL 300 MG/ML IV SOLN  Comparison: Chest radiograph from the same day and earlier.  PET-CT 02/10/2010.  Findings: Good contrast bolus timing in the pulmonary arterial tree.  Intermittent respiratory motion artifact. No focal filling defect identified in the pulmonary arterial tree to suggest the presence of acute pulmonary embolism.  Medial basal segment left lower lobe lung mass has mildly increased in size since 2011, now 24 x 21 mm (20 x 17 mm previously at comparable level).  There is superimposed tree in bud nodularity in the right middle lobe, and some peripheral airway nodularity also the lesion.  Major airways are patent.  There is lower lobe peribronchial thickening, including in the airway leading to the left lower lobe mass.  In the abdomen there is new ascites  adjacent to the liver and spleen.  Trace cholelithiasis.  Adrenal glands and other visualized upper abdominal viscera are stable.  No pericardial or pleural effusion.  Increased bilateral hilar nodal tissue measures up to 9 mm in short axis.  It is unclear what extent as was present in 2011.  Smaller prevascular, precarinal, and the right paratracheal lymph nodes are noted.  Negative thyroid and thoracic inlet.  Coronary artery calcifications.  No suspicious osseous lesion.  IMPRESSION: 1. No evidence of acute pulmonary embolus. 2.  Chronic left lower lobe lung mass has increased 4-5 mm in each axial dimension since 2011.  Indolent neoplasm is favored and recommend histologic evaluation of this lesion when possible. 3.  Superimposed acute distal airway infection suspected in the right middle lobe and lingula.   If the patient fails to respond to standard therapy, consider atypical entities such as MAC. 4.  Increased number of hilar and mediastinal lymph nodes, difficult to compare to the noncontrast CT portion of the 2011 study. These could be reactive, but pathology findings in #2 will guide follow-up.  5.  New small volume ascites in the upper abdomen.  Study discussed by telephone with Dr. Mancel Bale on 06/09/2011 at 1650 hours.  Original Report Authenticated By: Harley Hallmark, M.D.   Dg Chest Port 1 View  06/09/2011  *RADIOLOGY REPORT*  Clinical Data: Cough.  Smoking history.  PORTABLE CHEST - 1 VIEW  Comparison: T 03/2012  Findings: Artifact overlies the chest.  There is central bronchial thickening consists of bronchitis.  There is mild patchy infiltrate in the left lower lobe consistent with mild pneumonia.  No effusions.  No bony abnormalities.  IMPRESSION: Bronchitis.  Patchy infiltrate left lower lobe consistent with pneumonia.  Original Report Authenticated By: Thomasenia Sales, M.D.    Medications: I have reviewed the patient's current medications.   Patient Active Hospital Problem  List: Anasarca (06/09/2011) At this point etiology uncertain.  Suspecting diabetic nephropathy vs acute diastolic CHF.  Awaiting results of 24 hour urine for proteins, fasting lipids and 2-D echocardiogram without contrast.    Elevated Cardiac Enzymes:   Will order another EKG stat.  Patient is currently asymptomatic but will go ahead and give aspirin 325mg .  Consult cardiology for further evaluation and recommendations given current clinical scenario.  PNA (pneumonia) (06/09/2011) A post obstructive pneumonia reportedly. Will plan on continuing current antibiotic regimen.  Pt is afebrile and WBC WNL.  Lung mass (06/09/2011) Have consulted Pulmonology for further evaluation.  Will follow up.  Tobacco abuse (06/09/2011) Recommend tobacco cessation.  Diabetic retinopathy associated with type 2 diabetes mellitus (06/09/2011) Stable currently.     LOS: 1 day   Penny Pia M.D.  [facility] 06/10/2011, 8:12 AM

## 2011-06-10 NOTE — Significant Event (Signed)
Patient's heart rate ran between 135-140s and asymptomatic. Also, lab called notify patient's troponin level is positive at 0.44. Called provider on-call Mariea Stable, MD) to provide info; stated would review patient's chart and call back. Will continue to monitor patient.

## 2011-06-10 NOTE — Consult Note (Signed)
Patient: Hannah [name] DOB: Jan 05, 1971 Date of Admission: 06/09/2011            Pulmonary consult  Date of Consult: 06/10/2011 MD requesting consult: [facility] Reason for consult: Pulmonary nodule  HPI -  41yo female active smoker with hx DM, peripheral neuropathy, diabetic retinopathy who presented 2/20 with 2-3 week hx progressive edema.  Began in BLE and progressed to abdomen, upper extremities and eventually periorbital edema, associated with non productive cough and orthopnea. Admitted by [facility] hospitalist with CHF and uncontrolled DM.  On CT chest LLL lung mass noted and has increased in size. This nodule was known previously and pt has had PET scan approx 1 year ago which showed minimal uptake.  PCCM consulted to eval given increase in size of LLL nodule.   Allergies:  Allergies  Allergen Reactions  . Ciprofloxacin Rash     PMH: Past Medical History  Diagnosis Date  . Diabetes mellitus   . High cholesterol   . Diabetic retinopathy   . Peripheral neuropathy     "tips of toes"    Home meds: Prior to Admission medications   Medication  Sig  Start Date  End Date  Taking?  Authorizing Provider   CYANOCOBALAMIN PO  Take 1 tablet by mouth daily.    Yes  Historical Provider, MD   glipiZIDE (GLUCOTROL) 5 MG tablet  Take 5 mg by mouth 2 (two) times daily before a meal.    Yes  Historical Provider, MD   HYDROcodone-acetaminophen (NORCO) 5-325 MG per tablet  Take 1 tablet by mouth every 4 (four) hours as needed for pain.  06/01/11  06/11/11  Yes  Scarlette Calico C. Sanford, PA   ibuprofen (ADVIL,MOTRIN) 200 MG tablet  Take 200 mg by mouth every 6 (six) hours as needed. For pain    Yes  Historical Provider, MD   metFORMIN (GLUCOPHAGE) 1000 MG tablet  Take 1,000 mg by mouth 2 (two) times daily.    Yes  Historical Provider, MD   ondansetron (ZOFRAN-ODT) 4 MG disintegrating tablet  Take 4 mg by mouth every 8 (eight) hours as needed. For nausea           Social Hx: History   Social History  .  Marital Status: Married    Spouse Name: N/A    Number of Children: 3  . Years of Education: N/A   Occupational History  . Not on file.   Social History Main Topics  . Smoking status: Current Everyday Smoker -- 2.0 packs/day for 25 years    Types: Cigarettes  . Smokeless tobacco: Never Used  . Alcohol Use: No  . Drug Use: Yes    Special: Marijuana     "last time for marijuana ~ 1993"  . Sexually Active: Yes   Other Topics Concern  . Not on file   Social History Narrative  . No narrative on file     Family Hx: Family History  Problem Relation Age of Onset  . Coronary artery disease Father      ROS: Pt c/o generalized edema.  This started in BLE and progressed to arms, abdomen and eventually eyes.  She c/o orthopnea and dry cough, only occasionally productive of minimal white sputum.  Does have significant smoking hx.  Denies fevers, chills, SOB, chest pain, hemoptysis, leg/calf pain, weight loss.  Does c/o intermittent n/v which is chronic.   Filed Vitals:   06/09/11 2259 06/10/11 0137 06/10/11 0532 06/10/11 0753  BP: 151/96  132/89  Pulse: 130  134   Temp: 98.5 F (36.9 C)  98 F (36.7 C)   TempSrc: Oral  Oral   Resp: 18  20   Height: 5\' 7"  (1.702 m)     Weight: 251 lb 1.7 oz (113.9 kg)  250 lb 7.1 oz (113.6 kg)   SpO2: 100% 96% 98% 98%    Radiology - Ct Angio Chest W/cm &/or Wo Cm  06/09/2011  *RADIOLOGY REPORT*  Clinical Data: 41 year old female with shortness of breath, rapid heart rate, swelling.  CT ANGIOGRAPHY CHEST  Technique:  Multidetector CT imaging of the chest using the standard protocol during bolus administration of intravenous contrast. Multiplanar reconstructed images including MIPs were obtained and reviewed to evaluate the vascular anatomy.  Contrast: OMNIPAQUE IOHEXOL 300 MG/ML IV SOLN  Comparison: Chest radiograph from the same day and earlier.  PET-CT 02/10/2010.  Findings: Good contrast bolus timing in the pulmonary arterial tree.   Intermittent respiratory motion artifact. No focal filling defect identified in the pulmonary arterial tree to suggest the presence of acute pulmonary embolism.  Medial basal segment left lower lobe lung mass has mildly increased in size since 2011, now 24 x 21 mm (20 x 17 mm previously at comparable level).  There is superimposed tree in bud nodularity in the right middle lobe, and some peripheral airway nodularity also the lesion.  Major airways are patent.  There is lower lobe peribronchial thickening, including in the airway leading to the left lower lobe mass.  In the abdomen there is new ascites adjacent to the liver and spleen.  Trace cholelithiasis.  Adrenal glands and other visualized upper abdominal viscera are stable.  No pericardial or pleural effusion.  Increased bilateral hilar nodal tissue measures up to 9 mm in short axis.  It is unclear what extent as was present in 2011.  Smaller prevascular, precarinal, and the right paratracheal lymph nodes are noted.  Negative thyroid and thoracic inlet.  Coronary artery calcifications.  No suspicious osseous lesion.  IMPRESSION: 1. No evidence of acute pulmonary embolus. 2.  Chronic left lower lobe lung mass has increased 4-5 mm in each axial dimension since 2011.  Indolent neoplasm is favored and recommend histologic evaluation of this lesion when possible. 3.  Superimposed acute distal airway infection suspected in the right middle lobe and lingula.   If the patient fails to respond to standard therapy, consider atypical entities such as MAC. 4.  Increased number of hilar and mediastinal lymph nodes, difficult to compare to the noncontrast CT portion of the 2011 study. These could be reactive, but pathology findings in #2 will guide follow-up.  5.  New small volume ascites in the upper abdomen.  Study discussed by telephone with Dr. Mancel Bale on 06/09/2011 at 1650 hours.  Original Report Authenticated By: Harley Hallmark, M.D.   Dg Chest Port 1  View  06/09/2011  *RADIOLOGY REPORT*  Clinical Data: Cough.  Smoking history.  PORTABLE CHEST - 1 VIEW  Comparison: T 03/2012  Findings: Artifact overlies the chest.  There is central bronchial thickening consists of bronchitis.  There is mild patchy infiltrate in the left lower lobe consistent with mild pneumonia.  No effusions.  No bony abnormalities.  IMPRESSION: Bronchitis.  Patchy infiltrate left lower lobe consistent with pneumonia.  Original Report Authenticated By: Thomasenia Sales, M.D.     CBC    Component Value Date/Time   WBC 7.7 06/10/2011 0301   RBC 4.29 06/10/2011 0301   HGB 12.6 06/10/2011  0301   HCT 37.9 06/10/2011 0301   PLT 377 06/10/2011 0301   MCV 88.3 06/10/2011 0301   MCH 29.4 06/10/2011 0301   MCHC 33.2 06/10/2011 0301   RDW 13.6 06/10/2011 0301   LYMPHSABS 2.4 06/09/2011 1302   MONOABS 0.4 06/09/2011 1302   EOSABS 0.2 06/09/2011 1302   BASOSABS 0.0 06/09/2011 1302     BMET    Component Value Date/Time   NA 135 06/10/2011 0301   K 3.6 06/10/2011 0301   CL 106 06/10/2011 0301   CO2 24 06/10/2011 0301   GLUCOSE 162* 06/10/2011 0301   BUN 19 06/10/2011 0301   CREATININE 0.97 06/10/2011 0301   CALCIUM 8.2* 06/10/2011 0301   GFRNONAA 71* 06/10/2011 0301   GFRAA 82* 06/10/2011 0301     EXAM: General: obese female, NAD eating lunch Neuro: awake and alert, MAE, appropriate HEENT: mm moist, no JVD, periorbital edema CV: s1s2 rrr, no m/r/g PULM: resps even non labored on [place], few scattered ronchi, bronchial breath sounds LLL GI: abd soft, _bs Extremities: warm and dry, 2+ BLE edema   IMPRESSION/ PLAN:  1. LLL pulmonary nodule -- previously known with minimal uptake on PET approx 1 year ago.  Now increased in size from 20x71mm --> 24x45mm with hilar and mediastinal lymphadenpathy.  Concerning for malignancy given significant smoking hx (50 pack years) and documented growth. If indeed malignant, it is a very low grade malignancy as demonstrated by low metabolic activity and slow  growth. There is no urgency to address this matter during this hospitalization. She would need to be in much better condition to entertain any sort of diagnostic workup or therapeutic interventions. Diagnostically, this will require either ENB or thoracotomy for resection.  I will schedule her for follow up with Dr Delton Coombes in 4 wks or so. She will need full PFTs as well to eval COPD and determine whether she is a candidate for aggressive interventions.    2) ? PNA - I really don't see much on the CT chest that would support a dx of acute bacterial PNA. Her presentation was not terribly suggestive of an active infection. Suggest D/C of IV abx. If it is felt that she should receive abx for a purulent bronchitis or COPD exacerbation, suggest doxycycline 100 mg BID for 7 days   ADD:  She is scheduled for outpatient PFTs on Jun 21, 2011 @ 1:00 to be performed @ [facility]  She is scheduled for followup with Dr Delton Coombes on 07/02/11 @ 3:15 for CXR and ROV   Please call if we can be of further assistance  Billy Fischer, MD;  PCCM service; Mobile [phone]

## 2011-06-10 NOTE — Plan of Care (Signed)
Problem: Food- and Nutrition-Related Knowledge Deficit (NB-1.1) Goal: Nutrition education Formal process to instruct or train a patient/client in a skill or to impart knowledge to help patients/clients voluntarily manage or modify food choices and eating behavior to maintain or improve health.  Outcome: Completed/Met Date Met:  06/10/11 Patient has new onset CHF. Patient was seen by RD to complete Heart Failure booklet education. Patient is legally blind, but some days is able to see well enough for daily activities. Patient was given the HF booklet and RD went through the recommendations. Patient was willing to make changes to lifestyle and diet. Patient was very receptive of low sodium diet, and does follow some sodium restrictions at home. Patient has family support at home. RD reviewed chart, no additional nutrition interventions at this time.   Hannah [name] [phone]

## 2011-06-10 NOTE — Progress Notes (Signed)
*  PRELIMINARY RESULTS* Echocardiogram 2D Echocardiogram has been performed.  Clide Deutscher 06/10/2011, 4:12 PM

## 2011-06-10 NOTE — Progress Notes (Signed)
Provider on-call Freddie Apley, MD) called back. Stated that he has reviewed all pertinent results, placed order for drug screen test. Also, since pt. still denies pain, headache, discomfort, dizziness, chest pain, etc. no other interventions needed, continue to monitor patient and he will update the team in the next shift report of patient status. If changes occurs to inform him. Will continue to monitor patient and update dayshift nurse during shift report.

## 2011-06-11 LAB — GLUCOSE, CAPILLARY: Glucose-Capillary: 279 mg/dL — ABNORMAL HIGH (ref 70–99)

## 2011-06-11 LAB — BASIC METABOLIC PANEL
BUN: 17 mg/dL (ref 6–23)
Chloride: 107 mEq/L (ref 96–112)
GFR calc Af Amer: 77 mL/min — ABNORMAL LOW (ref >90)
Potassium: 4 mEq/L (ref 3.5–5.1)
Sodium: 136 mEq/L (ref 135–145)

## 2011-06-11 LAB — MAGNESIUM: Magnesium: 1.6 mg/dL (ref 1.5–2.5)

## 2011-06-11 MED ORDER — HYDROCORTISONE VALERATE 0.2 % EX OINT
TOPICAL_OINTMENT | Freq: Two times a day (BID) | CUTANEOUS | Status: DC
  Administered 2011-06-12 – 2011-06-15 (×8): via TOPICAL
  Filled 2011-06-11: qty 15

## 2011-06-11 MED ORDER — HYDROCORTISONE VALERATE 0.2 % EX CREA
TOPICAL_CREAM | Freq: Every day | CUTANEOUS | Status: DC
  Filled 2011-06-11: qty 15

## 2011-06-11 NOTE — Progress Notes (Signed)
Clinical Child psychotherapist received referral for Scientist, water quality. Clinical Social Worker to follow up and completed psychosocial assessment within next 24-48 hours and provide pt information on Advanced Directives as appropriate.  Jacklynn Lewis, MSW, LCSWA  Clinical Social Work [phone]

## 2011-06-11 NOTE — ED Provider Notes (Signed)
Medical screening examination/treatment/procedure(s) were performed by non-physician practitioner and as supervising physician I was immediately available for consultation/collaboration.  Erva Koke R. Keyron Pokorski, MD 06/11/11 1143 

## 2011-06-11 NOTE — Progress Notes (Signed)
Subjective: Pt is less swollen today.  Echo results were not available while rounding today.  Pt states that for the last month she was not able to eat well and has only recently been eating well with good appetite.  She had been tachycardic yesterday with Sinus tachycardia in 130's.  I added a beta blocker which she states she is currently tolerating well and reports no side effects.  Objective: Filed Vitals:   06/10/11 2059 06/11/11 0448 06/11/11 1026 06/11/11 1430  BP: 148/84 121/71 133/71 135/89  Pulse: 91 90 95 88  Temp: 99.2 F (37.3 C) 98.4 F (36.9 C)  98.7 F (37.1 C)  TempSrc: Oral Oral  Oral  Resp: 18 19  20   Height:      Weight:  113 kg (249 lb 1.9 oz)    SpO2: 100% 96%  100%   Weight change: 1.869 kg (4 lb 1.9 oz)  Intake/Output Summary (Last 24 hours) at 06/11/11 1802 Last data filed at 06/11/11 1734  Gross per 24 hour  Intake   1713 ml  Output   3650 ml  Net  -1937 ml    General: Alert, awake, oriented x3, in no acute distress.  HEENT: No bruits, no goiter.  Heart: Regular rate and rhythm, without murmurs, rubs, gallops.  Lungs: Clear to auscultation Abdomen: Soft, nontender, nondistended, positive bowel sounds.  Neuro: Grossly intact, nonfocal. Extremities:  There is some Edema BL no errythema or cellulitis.   Lab Results:  Basename 06/11/11 0510 06/10/11 1414 06/10/11 0301  NA 136 -- 135  K 4.0 -- 3.6  CL 107 -- 106  CO2 23 -- 24  GLUCOSE 181* -- 162*  BUN 17 -- 19  CREATININE 1.03 -- 0.97  CALCIUM 8.2* -- 8.2*  MG 1.6 1.5 --  PHOS -- 4.0 --    Basename 06/09/11 1302  AST 14  ALT 12  ALKPHOS 79  BILITOT 0.1*  PROT 4.7*  ALBUMIN 0.9*   No results found for this basename: LIPASE:2,AMYLASE:2 in the last 72 hours  Basename 06/10/11 0301 06/09/11 1326 06/09/11 1302  WBC 7.7 -- 9.6  NEUTROABS -- -- 6.5  HGB 12.6 11.6* --  HCT 37.9 34.0* --  MCV 88.3 -- 89.2  PLT 377 -- 396    Basename 06/10/11 1115 06/10/11 0301 06/09/11 1925  CKTOTAL  166 182* 205*  CKMB 6.1* 6.7* 7.4*  CKMBINDEX -- -- --  TROPONINI <0.30 0.44* <0.30   No components found with this basename: POCBNP:3 No results found for this basename: DDIMER:2 in the last 72 hours  Basename 06/10/11 0301  HGBA1C 8.6*    Basename 06/10/11 0301  CHOL 319*  HDL 31*  LDLCALC 233*  TRIG 275*  CHOLHDL 10.3  LDLDIRECT --    Basename 06/09/11 1925  TSH 1.215  T4TOTAL --  T3FREE --  THYROIDAB --   No results found for this basename: VITAMINB12:2,FOLATE:2,FERRITIN:2,TIBC:2,IRON:2,RETICCTPCT:2 in the last 72 hours  Micro Results: No results found for this or any previous visit (from the past 240 hour(s)).  Studies/Results: No results found.  Medications: I have reviewed the patient's current medications.   Patient Active Hospital Problem List: Cardiac enzymes elevated (06/10/2011) Cardiology is on board and last troponin was WNL.  Lung mass (06/09/2011) Pt is to get further work up as outpatient as outlined in pulmonology's note.  Once patient is ready for discharge will provide information for her to follow up.  Anasarca (06/09/2011) Etiology?  Patient did have a decrease in albumin level of  0.9.  She reports poor intake this month but her urinalysis did show loss of protein. 24 hour urine protein was ordered but was not able to be calculated.  Thus after speaking with Lab I have reordered this today.  Have discussed with patient.  Last creatinine 1.03.    PNA (pneumonia) (06/09/2011) Pulmonology has recommended discontinuation of antibiotics as they don't feel that patient is currently having an active bacterial pneumonia.  Thus I have discontinued the antibiotics today and will continue to monitor vitals.   Diastolic CHF, acute (06/09/2011) Pt is currently on lasix.  Tobacco abuse (06/09/2011) Recommend tobacco cessation.  Diabetic retinopathy associated with type 2 diabetes mellitus (06/09/2011)  Stable currently.   LOS: 2 days   Penny Pia  M.D.  [facility] 06/11/2011, 6:02 PM

## 2011-06-11 NOTE — Progress Notes (Signed)
Patient noted during evening meds that hyydrocortisone valerate cream (WESTCORT) 0.2 % that was scheduled for 1615 was not administered to her because daytime nurse was waiting for it to be sent from pharmacy. Found med in bin. Noticed med was an ointment rather than cream, thus called the pharmacy to inform. Pharmacy stated they did not have 0.2% cream in stock , but had 2.5% cream and 1% cream. Notified provider on-call Burnadette Peter, M., NP) who d/c previous order and placed new order. Once acknowledged and verified by pharmacy, I administered 0.2% ointment to patient. Patient tolerated med well.

## 2011-06-11 NOTE — ED Provider Notes (Signed)
Medical screening examination/treatment/procedure(s) were performed by non-physician practitioner and as supervising physician I was immediately available for consultation/collaboration.  Audrey Thull R. Shunte Senseney, MD 06/11/11 1143 

## 2011-06-11 NOTE — Progress Notes (Signed)
SUBJECTIVE: No chest pain. Breathing is ok. Swelling slightly better.   BP 121/71  Pulse 90  Temp(Src) 98.4 F (36.9 C) (Oral)  Resp 19  Ht 5\' 7"  (1.702 m)  Wt 249 lb 1.9 oz (113 kg)  BMI 39.02 kg/m2  SpO2 96%  LMP 05/21/2011  Intake/Output Summary (Last 24 hours) at 06/11/11 0701 Last data filed at 06/11/11 1610  Gross per 24 hour  Intake   1863 ml  Output   2250 ml  Net   -387 ml    PHYSICAL EXAM General: Well developed, well nourished, in no acute distress. Alert and oriented x 3.  Psych:  Good affect, responds appropriately Neck: No JVD. No masses noted.  Lungs: Clear bilaterally with no wheezes or rhonci noted.  Heart: RRR with no murmurs noted. Abdomen: Bowel sounds are present. Soft, non-tender.  Extremities: 2-3+ bilateral lower extremity edema.   LABS: Basic Metabolic Panel:  Basename 06/11/11 0510 06/10/11 1414 06/10/11 0301  NA 136 -- 135  K 4.0 -- 3.6  CL 107 -- 106  CO2 23 -- 24  GLUCOSE 181* -- 162*  BUN 17 -- 19  CREATININE 1.03 -- 0.97  CALCIUM 8.2* -- 8.2*  MG 1.6 1.5 --  PHOS -- 4.0 --   CBC:  Basename 06/10/11 0301 06/09/11 1326 06/09/11 1302  WBC 7.7 -- 9.6  NEUTROABS -- -- 6.5  HGB 12.6 11.6* --  HCT 37.9 34.0* --  MCV 88.3 -- 89.2  PLT 377 -- 396   Cardiac Enzymes:  Basename 06/10/11 1115 06/10/11 0301 06/09/11 1925  CKTOTAL 166 182* 205*  CKMB 6.1* 6.7* 7.4*  CKMBINDEX -- -- --  TROPONINI <0.30 0.44* <0.30   Fasting Lipid Panel:  Basename 06/10/11 0301  CHOL 319*  HDL 31*  LDLCALC 233*  TRIG 275*  CHOLHDL 10.3  LDLDIRECT --    Current Meds:    . aspirin  325 mg Oral Daily  . azithromycin  500 mg Intravenous Q24H  . cefTRIAXone (ROCEPHIN)  IV  1 g Intravenous Q24H  . enoxaparin  55 mg Subcutaneous Q24H  . furosemide  40 mg Oral BID  . influenza  inactive virus vaccine  0.5 mL Intramuscular Once  . insulin aspart  0-5 Units Subcutaneous QHS  . insulin aspart  0-9 Units Subcutaneous TID WC  . metoprolol  tartrate  25 mg Oral BID  . nicotine  21 mg Transdermal Daily  . potassium chloride  20 mEq Oral BID  . sodium chloride  3 mL Intravenous Q12H  . DISCONTD: albuterol  2.5 mg Nebulization Q6H  . DISCONTD: furosemide  40 mg Intravenous Daily  . DISCONTD: ipratropium  0.5 mg Nebulization Q6H  . DISCONTD: metoprolol tartrate  25 mg Oral BID     ASSESSMENT AND PLAN:  1. Volume excess: This is possibly CHF. The patient is volume overloaded on examination and her BNP is elevated.Echo is pending. The echo was done late yesterday but images are not available for review this am in the system. Continue diuresis with Lasix and follow renal function. Note her albumin is 0.9 and decreased oncotic pressure is most likely contributing to her diffuse edema. 24 hour urine as she well could have nephrotic syndrome. Further recommendations once LV function known.   2.Elevated troponin-she is not having chest pain. Only one value elevated. Continue aspirin. No further evaluation at this point.   3. Lung mass: the patient may have lung cancer. Pulm on board with plans for outpt workup.  Willadeen Colantuono  2/22/20137:01 AM

## 2011-06-12 LAB — GLUCOSE, CAPILLARY
Glucose-Capillary: 167 mg/dL — ABNORMAL HIGH (ref 70–99)
Glucose-Capillary: 181 mg/dL — ABNORMAL HIGH (ref 70–99)

## 2011-06-12 LAB — BASIC METABOLIC PANEL
Calcium: 8.4 mg/dL (ref 8.4–10.5)
GFR calc Af Amer: 88 mL/min — ABNORMAL LOW (ref >90)
GFR calc non Af Amer: 76 mL/min — ABNORMAL LOW (ref >90)
Potassium: 4.2 mEq/L (ref 3.5–5.1)
Sodium: 136 mEq/L (ref 135–145)

## 2011-06-12 LAB — PROTEIN, URINE, 24 HOUR
Collection Interval-UPROT: 24 hours
Protein, 24H Urine: 10086 mg/d — ABNORMAL HIGH (ref 50–100)
Urine Total Volume-UPROT: 4100 mL

## 2011-06-12 NOTE — Progress Notes (Signed)
Patient Name: Hannah [name]      SUBJECTIVE: Admitted with a borderline elevated troponin and possible congestive heart failure. An echo has been ordered but not done or read as best as I can tell. She apparently has an albumin of 0.9 as well as a pulmonary mass concerning for a low-grade malignancy for which outpatient evaluation is anticipated.  Past Medical History  Diagnosis Date  . Diabetes mellitus   . High cholesterol   . Diabetic retinopathy   . Peripheral neuropathy     "tips of toes"    PHYSICAL EXAM Filed Vitals:   06/11/11 2114 06/12/11 0435 06/12/11 0900 06/12/11 1036  BP: 142/83 151/84 131/71 120/78  Pulse: 94 89 87 87  Temp: 98.5 F (36.9 C) 97.9 F (36.6 C) 98.7 F (37.1 C)   TempSrc: Oral     Resp: 20 16    Height:      Weight:  247 lb 2.2 oz (112.1 kg)    SpO2: 99% 100% 95%     Well developed and nourished in no acute distress HENT normal Neck supple with JVP-flat Carotids brisk and full without bruits Clear Reg ular rate and rhythm, no murmurs or gallops Abd-soft with active BS without hepatomegaly No Clubbing cyanosis braawqny edema  Skin-warm and dry with venous dependence changes A & Oriented  Grossly normal sensory and motor function    LABS: Basic Metabolic Panel:  Lab 06/12/11 1610 06/11/11 0510 06/10/11 1414 06/10/11 0301 06/09/11 1326 06/09/11 1302  NA 136 136 -- 135 138 135  K 4.2 4.0 -- 3.6 4.2 4.2  CL 107 107 -- 106 108 106  CO2 23 23 -- 24 -- 23  GLUCOSE 167* 181* -- 162* 236* 233*  BUN 17 17 -- 19 23 23   CREATININE 0.92 1.03 -- 0.97 0.90 1.12*  CALCIUM 8.4 8.2* -- -- -- --  MG -- 1.6 1.5 -- -- --  PHOS -- -- 4.0 -- -- --   Cardiac Enzymes:  Basename 06/10/11 1115 06/10/11 0301 06/09/11 1925  CKTOTAL 166 182* 205*  CKMB 6.1* 6.7* 7.4*  CKMBINDEX -- -- --  TROPONINI <0.30 0.44* <0.30   CBC:  Lab 06/10/11 0301 06/09/11 1326 06/09/11 1302  WBC 7.7 -- 9.6  NEUTROABS -- -- 6.5  HGB 12.6 11.6* 12.3  HCT 37.9 34.0*  36.3  MCV 88.3 -- 89.2  PLT 377 -- 396   PROTIME: No results found for this basename: LABPROT:3,INR:3 in the last 72 hours Liver Function Tests:  Basename 06/09/11 1302  AST 14  ALT 12  ALKPHOS 79  BILITOT 0.1*  PROT 4.7*  ALBUMIN 0.9*   No results found for this basename: LIPASE:2,AMYLASE:2 in the last 72 hours BNP: No components found with this basename: POCBNP:3 D-Dimer: No results found for this basename: DDIMER:2 in the last 72 hours Hemoglobin A1C:  Basename 06/10/11 0301  HGBA1C 8.6*   Fasting Lipid Panel:  Basename 06/10/11 0301  CHOL 319*  HDL 31*  LDLCALC 233*  TRIG 275*  CHOLHDL 10.3  LDLDIRECT --   Thyroid Function Tests:  Basename 06/09/11 1925  TSH 1.215  T4TOTAL --  T3FREE --  THYROIDAB --   Anemia Panel: No results found for this basename: VITAMINB12,FOLATE,FERRITIN,TIBC,IRON,RETICCTPCT in the last 72 hours    ASSESSMENT AND PLAN: Patient with anasarca and hypoalbuminemia with a borderline elevated troponin and fluid overload. We await her echo report. At this point no specific cardiac recommendations.   Signed, Sherryl Manges MD  06/12/2011

## 2011-06-12 NOTE — Progress Notes (Signed)
Subjective: Pt states that she feels better today than she has in months.  Has been able to eat well without nausea or vomiting.  No acute issues overnight reported.  Objective: Filed Vitals:   06/12/11 0435 06/12/11 0900 06/12/11 1036 06/12/11 1400  BP: 151/84 131/71 120/78 102/56  Pulse: 89 87 87 81  Temp: 97.9 F (36.6 C) 98.7 F (37.1 C)  98.3 F (36.8 C)  TempSrc:    Oral  Resp: 16   20  Height:      Weight: 112.1 kg (247 lb 2.2 oz)     SpO2: 100% 95%  95%   Weight change: -0.9 kg (-1 lb 15.8 oz)  Intake/Output Summary (Last 24 hours) at 06/12/11 1758 Last data filed at 06/12/11 1600  Gross per 24 hour  Intake    942 ml  Output   6020 ml  Net  -5078 ml    General: Alert, awake, oriented x3, in no acute distress.  HEENT: No bruits, no goiter.  Heart: Regular rate and rhythm, without murmurs, rubs, gallops.  Lungs: Clear to auscultation BL, no wheezes Abdomen: Soft, nontender, nondistended, positive bowel sounds.  Neuro: Grossly intact, nonfocal. Extremities:  Pitting edema at LE BL.   Lab Results:  Basename 06/12/11 0700 06/11/11 0510 06/10/11 1414  NA 136 136 --  K 4.2 4.0 --  CL 107 107 --  CO2 23 23 --  GLUCOSE 167* 181* --  BUN 17 17 --  CREATININE 0.92 1.03 --  CALCIUM 8.4 8.2* --  MG -- 1.6 1.5  PHOS -- -- 4.0   No results found for this basename: AST:2,ALT:2,ALKPHOS:2,BILITOT:2,PROT:2,ALBUMIN:2 in the last 72 hours No results found for this basename: LIPASE:2,AMYLASE:2 in the last 72 hours  Basename 06/10/11 0301  WBC 7.7  NEUTROABS --  HGB 12.6  HCT 37.9  MCV 88.3  PLT 377    Basename 06/10/11 1115 06/10/11 0301 06/09/11 1925  CKTOTAL 166 182* 205*  CKMB 6.1* 6.7* 7.4*  CKMBINDEX -- -- --  TROPONINI <0.30 0.44* <0.30   No components found with this basename: POCBNP:3 No results found for this basename: DDIMER:2 in the last 72 hours  Basename 06/10/11 0301  HGBA1C 8.6*    Basename 06/10/11 0301  CHOL 319*  HDL 31*  LDLCALC 233*   TRIG 275*  CHOLHDL 10.3  LDLDIRECT --    Basename 06/09/11 1925  TSH 1.215  T4TOTAL --  T3FREE --  THYROIDAB --   No results found for this basename: VITAMINB12:2,FOLATE:2,FERRITIN:2,TIBC:2,IRON:2,RETICCTPCT:2 in the last 72 hours  Micro Results: No results found for this or any previous visit (from the past 240 hour(s)).  Studies/Results: No results found.  Medications: I have reviewed the patient's current medications.  Patient Active Hospital Problem List: Cardiac enzymes elevated (06/10/2011) Cardiology is on board and last troponin was WNL.   Lung mass (06/09/2011) Pt is to get further work up as outpatient as outlined in pulmonology's note. Once patient is ready for discharge will provide information for her to follow up.   Anasarca (06/09/2011) Etiology? Patient did have a decrease in albumin level of 0.9. She reports poor intake this month but her urinalysis did show loss of protein. 24 hour urine protein pending.  Have discussed with patient. Last creatinine 0.92   PNA (pneumonia) (06/09/2011) Pulmonology has recommended discontinuation of antibiotics as they don't feel that patient is currently having an active bacterial pneumonia. Thus I have discontinued the antibiotics today and will continue to monitor vitals.   Diastolic CHF,  acute (06/09/2011) Pt is currently on lasix.   Tobacco abuse (06/09/2011) Recommend tobacco cessation.   Diabetic retinopathy associated with type 2 diabetes mellitus (06/09/2011) Stable currently.    LOS: 3 days   Penny Pia M.D.  [facility] 06/12/2011, 5:58 PM

## 2011-06-13 DIAGNOSIS — E8779 Other fluid overload: Secondary | ICD-10-CM

## 2011-06-13 DIAGNOSIS — N049 Nephrotic syndrome with unspecified morphologic changes: Secondary | ICD-10-CM | POA: Diagnosis present

## 2011-06-13 LAB — CBC
MCHC: 33.8 g/dL (ref 30.0–36.0)
MCV: 88.1 fL (ref 78.0–100.0)
Platelets: 359 10*3/uL (ref 150–400)
RDW: 13.4 % (ref 11.5–15.5)
WBC: 8.6 10*3/uL (ref 4.0–10.5)

## 2011-06-13 LAB — BASIC METABOLIC PANEL
BUN: 18 mg/dL (ref 6–23)
Chloride: 105 mEq/L (ref 96–112)
Creatinine, Ser: 1.09 mg/dL (ref 0.50–1.10)
GFR calc Af Amer: 72 mL/min — ABNORMAL LOW (ref >90)
GFR calc non Af Amer: 62 mL/min — ABNORMAL LOW (ref >90)
Potassium: 4.5 mEq/L (ref 3.5–5.1)

## 2011-06-13 LAB — GLUCOSE, CAPILLARY
Glucose-Capillary: 211 mg/dL — ABNORMAL HIGH (ref 70–99)
Glucose-Capillary: 285 mg/dL — ABNORMAL HIGH (ref 70–99)

## 2011-06-13 LAB — HEPATITIS C ANTIBODY (REFLEX): HCV Ab: NEGATIVE

## 2011-06-13 LAB — IRON AND TIBC: Saturation Ratios: 22 % (ref 20–55)

## 2011-06-13 MED ORDER — METOPROLOL SUCCINATE ER 25 MG PO TB24
25.0000 mg | ORAL_TABLET | Freq: Every day | ORAL | Status: DC
  Administered 2011-06-13 – 2011-06-15 (×3): 25 mg via ORAL
  Filled 2011-06-13 (×3): qty 1

## 2011-06-13 NOTE — Consult Note (Signed)
Reason for Consult:Nephrotic syndrome Referring Physician: Dr. Roma Schanz is an 41 y.o. female.  HPI: 41 yr female with over 20 yr of Dm.  Pos FH in father, brother and sister with DM.  No hx Htn.  Admitted with PND, orthop, and progressive diffuse edema.  Found to have over 10 gm protein in 24 h urine.  Does not know of renal disease.  Has not seen primary MD in over a yr.  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  Hx of lung mass that has enlarged and needs bx.  No hx RF, SF,no FH of renal disease, hearing or eye or ms skel defects.  Father has heart disease.  No rash, only allergic to Cipro.  Has been taking Ibuprofen for RUQ pain.  Has N & V for several week.  Told in past of blood in urine and hx of multiple UTIs. ROS:  No HA, but ringing in R ear CV as above, no P but freq nocturia Pulm 40 pk yr, no asthma GI as above and freq indigestion, new RUQ pain Skin rash on LE with edema and ulcer on R great toe Neuro neg. MS skel neg   Past Medical History  Diagnosis Date  . Diabetes mellitus   . High cholesterol   . Diabetic retinopathy   . Peripheral neuropathy     "tips of toes"    Past Surgical History  Procedure Date  . Cesarean section 1991; 1994  . Breast surgery   . Eye surgery   . Breast cyst incision and drainage ?1993    left  . Tubal ligation 1994  . Vitrectomy     bilaterally "right eye once; left eye twice"    Family History  Problem Relation Age of Onset  . Coronary artery disease Father     Social History:  reports that she has been smoking Cigarettes.  She has a 50 pack-year smoking history. She has never used smokeless tobacco. She reports that she uses illicit drugs (Marijuana). She reports that she does not drink alcohol.  Allergies:  Allergies  Allergen Reactions  . Ciprofloxacin Rash    Medications:  I have reviewed the patient's current medications. Prior to Admission:    Prescriptions prior to admission  Medication Sig Dispense Refill  . CYANOCOBALAMIN PO Take 1 tablet by mouth daily.      Marland Kitchen glipiZIDE (GLUCOTROL) 5 MG tablet Take 5 mg by mouth 2 (two) times daily before a meal.        . HYDROcodone-acetaminophen (NORCO) 5-325 MG per tablet Take 1 tablet by mouth every 4 (four) hours as needed for pain.  20 tablet  0  . ibuprofen (ADVIL,MOTRIN) 200 MG tablet Take 200 mg by mouth every 6 (six) hours as needed. For pain      . metFORMIN (GLUCOPHAGE) 1000 MG tablet Take 1,000 mg by mouth 2 (two) times daily.        . ondansetron (ZOFRAN-ODT) 4 MG disintegrating tablet Take 4 mg by mouth every 8 (eight) hours as needed. For nausea       Results for orders placed during the hospital encounter of 06/09/11 (from the past 48 hour(s))  GLUCOSE, CAPILLARY     Status: Abnormal   Collection Time   06/11/11  4:15 PM      Component Value Range Comment   Glucose-Capillary 279 (*) 70 - 99 (mg/dL)    Comment  1 Notify RN     PROTEIN, URINE, 24 HOUR     Status: Abnormal   Collection Time   06/11/11  5:30 PM      Component Value Range Comment   Urine Total Volume-UPROT 4100      Collection Interval-UPROT 24      Protein, Urine 246      Protein, 24H Urine 16109 (*) 50 - 100 (mg/day)   GLUCOSE, CAPILLARY     Status: Abnormal   Collection Time   06/11/11  9:11 PM      Component Value Range Comment   Glucose-Capillary 220 (*) 70 - 99 (mg/dL)    Comment 1 Notify RN     GLUCOSE, CAPILLARY     Status: Abnormal   Collection Time   06/12/11  6:40 AM      Component Value Range Comment   Glucose-Capillary 167 (*) 70 - 99 (mg/dL)   BASIC METABOLIC PANEL     Status: Abnormal   Collection Time   06/12/11  7:00 AM      Component Value Range Comment   Sodium 136  135 - 145 (mEq/L)    Potassium 4.2  3.5 - 5.1 (mEq/L)    Chloride 107  96 - 112 (mEq/L)    CO2 23  19 - 32 (mEq/L)    Glucose, Bld 167 (*) 70 - 99 (mg/dL)    BUN 17  6 - 23 (mg/dL)    Creatinine, Ser 6.04  0.50 - 1.10  (mg/dL)    Calcium 8.4  8.4 - 10.5 (mg/dL)    GFR calc non Af Amer 76 (*) >90 (mL/min)    GFR calc Af Amer 88 (*) >90 (mL/min)   GLUCOSE, CAPILLARY     Status: Abnormal   Collection Time   06/12/11 11:04 AM      Component Value Range Comment   Glucose-Capillary 248 (*) 70 - 99 (mg/dL)    Comment 1 Documented in Chart      Comment 2 Notify RN     GLUCOSE, CAPILLARY     Status: Abnormal   Collection Time   06/12/11  4:34 PM      Component Value Range Comment   Glucose-Capillary 208 (*) 70 - 99 (mg/dL)    Comment 1 Documented in Chart      Comment 2 Notify RN     GLUCOSE, CAPILLARY     Status: Abnormal   Collection Time   06/12/11 10:05 PM      Component Value Range Comment   Glucose-Capillary 181 (*) 70 - 99 (mg/dL)   CBC     Status: Abnormal   Collection Time   06/13/11  4:40 AM      Component Value Range Comment   WBC 8.6  4.0 - 10.5 (K/uL)    RBC 3.86 (*) 3.87 - 5.11 (MIL/uL)    Hemoglobin 11.5 (*) 12.0 - 15.0 (g/dL)    HCT 54.0 (*) 98.1 - 46.0 (%)    MCV 88.1  78.0 - 100.0 (fL)    MCH 29.8  26.0 - 34.0 (pg)    MCHC 33.8  30.0 - 36.0 (g/dL)    RDW 19.1  47.8 - 29.5 (%)    Platelets 359  150 - 400 (K/uL)   BASIC METABOLIC PANEL     Status: Abnormal   Collection Time   06/13/11  4:40 AM      Component Value Range Comment   Sodium 135  135 - 145 (mEq/L)  Potassium 4.5  3.5 - 5.1 (mEq/L)    Chloride 105  96 - 112 (mEq/L)    CO2 24  19 - 32 (mEq/L)    Glucose, Bld 201 (*) 70 - 99 (mg/dL)    BUN 18  6 - 23 (mg/dL)    Creatinine, Ser 1.61  0.50 - 1.10 (mg/dL)    Calcium 8.7  8.4 - 10.5 (mg/dL)    GFR calc non Af Amer 62 (*) >90 (mL/min)    GFR calc Af Amer 72 (*) >90 (mL/min)   GLUCOSE, CAPILLARY     Status: Abnormal   Collection Time   06/13/11  6:22 AM      Component Value Range Comment   Glucose-Capillary 211 (*) 70 - 99 (mg/dL)   GLUCOSE, CAPILLARY     Status: Abnormal   Collection Time   06/13/11 11:06 AM      Component Value Range Comment   Glucose-Capillary 285  (*) 70 - 99 (mg/dL)    Comment 1 Documented in Chart      Comment 2 Notify RN       No results found.  @ROS @ Blood pressure 119/74, pulse 88, temperature 97.5 F (36.4 C), temperature source Oral, resp. rate 20, height 5\' 7"  (1.702 m), weight 110.4 kg (243 lb 6.2 oz), last menstrual period 05/21/2011, SpO2 98.00%. @PHYSEXAMBYAGE2 @ Physical Examination: General appearance - alert, well appearing, and in no distress, overweight and pale Mental status - alert, oriented to person, place, and time Eyes - funduscopic exam abnormal L lid lower than R.  Laser scars& DM retinal dz bilat Mouth - dental hygiene poor and carious Neck - adenopathy noted PCL Lymphatics - posterior cervical nodes Chest - decreased air entry noted bilat, few rales Heart - normal rate and regular rhythm, S1 and S2 normal, systolic murmur Gr 2/6 at apex Abdomen - hepatomegaly down 5 cm,  Pos bs,obese,soft,skin edema Extremities - pedal edema 3+ +, malperforans on R great toe Skin - rash on LE   Assessment/Plan: 1 Nephrotic syndrome with anasarca.  Most likely DM, but cannot R/O other yet esp with lung mass. Need to R/O secondary MGM, inflam dz, or other.  Needs lung w/u   First step is diuresis and once vol lower add ACEI but not now. Hematuria also not typical for DM 2 Anemia check Fe 3 Obesity needs to lose 40-60 lb 4. DM needs tight control 5. Lung mass needs eval P see orders, diuresis, ling mass w/u , add ACEI as outpatient. W/U hematuria.  Magdaleno Lortie L 06/13/2011, 12:08 PM

## 2011-06-13 NOTE — Progress Notes (Addendum)
  Patient Name: Hannah [name]      SUBJECTIVE: Admitted with a borderline elevated troponin and possible congestive heart failure. l. She apparently has an albumin of 0.9 as well as a pulmonary mass concerning for a low-grade malignancy for which outpatient evaluation is anticipated.  Past Medical History  Diagnosis Date  . Diabetes mellitus   . High cholesterol   . Diabetic retinopathy   . Peripheral neuropathy     "tips of toes"    PHYSICAL EXAM Filed Vitals:   06/12/11 1036 06/12/11 1400 06/12/11 2130 06/13/11 0615  BP: 120/78 102/56 130/85 119/74  Pulse: 87 81 86 88  Temp:  98.3 F (36.8 C) 97.7 F (36.5 C) 97.5 F (36.4 C)  TempSrc:  Oral Oral Oral  Resp:  20 19 20   Height:      Weight:    243 lb 6.2 oz (110.4 kg)  SpO2:  95% 96% 98%    Well developed and nourished in no acute distress HENT normal Neck supple with JVP-flat Carotids brisk and full without bruits Clear Reg ular rate and rhythm, no murmurs or gallops Abd-soft with active BS without hepatomegaly No Clubbing cyanosis braawqny edema  Skin-warm and dry with venous dependence changes A & Oriented  Grossly normal sensory and motor function  ECho--Left ventricle: The cavity size was normal. Wall thickness was increased in a pattern of mild LVH. Systolic function was moderately reduced. The estimated ejection fraction was in the range of 35% to 40% - assessment difficult in light of resting heart rate in the 130's during the study. Diffuse hypokinesis. More prominent focalhypokinesis of the inferior myocardium. The study is not technically   LABS: Basic Metabolic Panel:  Lab 06/13/11 7846 06/12/11 0700 06/11/11 0510 06/10/11 1414 06/10/11 0301 06/09/11 1326 06/09/11 1302  NA 135 136 136 -- 135 138 135  K 4.5 4.2 4.0 -- 3.6 4.2 4.2  CL 105 107 107 -- 106 108 106  CO2 24 23 23  -- 24 -- 23  GLUCOSE 201* 167* 181* -- 162* 236* 233*  BUN 18 17 17  -- 19 23 23   CREATININE 1.09 0.92 1.03 -- 0.97 0.90  1.12*  CALCIUM 8.7 8.4 -- -- -- -- --  MG -- -- 1.6 1.5 -- -- --  PHOS -- -- -- 4.0 -- -- --   Cardiac Enzymes:  Basename 06/10/11 1115  CKTOTAL 166  CKMB 6.1*  CKMBINDEX --  TROPONINI <0.30   CBC:  Lab 06/13/11 0440 06/10/11 0301 06/09/11 1326 06/09/11 1302  WBC 8.6 7.7 -- 9.6  NEUTROABS -- -- -- 6.5  HGB 11.5* 12.6 11.6* 12.3  HCT 34.0* 37.9 34.0* 36.3  MCV 88.1 88.3 -- 89.2  PLT 359 377 -- 396       ASSESSMENT AND PLAN: Patient with anasarca and hypoalbuminemia with a borderline elevated troponin and fluid overload.   Echo is difficult to interpret at rapid HR  Hopefully will improve  Will continue betablockers >> toprol  Would try and add low dose ACE in am if blood pressure allows  Signed, Sherryl Manges MD  06/13/2011

## 2011-06-13 NOTE — Progress Notes (Addendum)
Subjective: Pt mentions that she still feels better.  No acute issues overnight and denies any nausea and vomiting.  Objective: Filed Vitals:   06/12/11 1400 06/12/11 2130 06/13/11 0615 06/13/11 1400  BP: 102/56 130/85 119/74 119/79  Pulse: 81 86 88 92  Temp: 98.3 F (36.8 C) 97.7 F (36.5 C) 97.5 F (36.4 C) 98.2 F (36.8 C)  TempSrc: Oral Oral Oral Oral  Resp: 20 19 20 20   Height:      Weight:   110.4 kg (243 lb 6.2 oz)   SpO2: 95% 96% 98% 98%   Weight change: -1.7 kg (-3 lb 12 oz)  Intake/Output Summary (Last 24 hours) at 06/13/11 1550 Last data filed at 06/13/11 1400  Gross per 24 hour  Intake   1563 ml  Output   4300 ml  Net  -2737 ml    General: Alert, awake, oriented x3, in no acute distress.  HEENT: No bruits, no goiter.  Heart: Regular rate and rhythm, without murmurs, rubs, gallops.  Lungs: Clear to auscultation BL, no wheezes  Abdomen: Soft, nontender, nondistended, positive bowel sounds.  Neuro: Grossly intact, nonfocal.  Extremities: Pitting edema at LE BL.   Lab Results:  Basename 06/13/11 1254 06/13/11 0440 06/12/11 0700 06/11/11 0510  NA -- 135 136 --  K -- 4.5 4.2 --  CL -- 105 107 --  CO2 -- 24 23 --  GLUCOSE -- 201* 167* --  BUN -- 18 17 --  CREATININE -- 1.09 0.92 --  CALCIUM -- 8.7 8.4 --  MG -- -- -- 1.6  PHOS 5.1* -- -- --   No results found for this basename: AST:2,ALT:2,ALKPHOS:2,BILITOT:2,PROT:2,ALBUMIN:2 in the last 72 hours No results found for this basename: LIPASE:2,AMYLASE:2 in the last 72 hours  Basename 06/13/11 0440  WBC 8.6  NEUTROABS --  HGB 11.5*  HCT 34.0*  MCV 88.1  PLT 359   No results found for this basename: CKTOTAL:3,CKMB:3,CKMBINDEX:3,TROPONINI:3 in the last 72 hours No components found with this basename: POCBNP:3 No results found for this basename: DDIMER:2 in the last 72 hours No results found for this basename: HGBA1C:2 in the last 72 hours No results found for this basename:  CHOL:2,HDL:2,LDLCALC:2,TRIG:2,CHOLHDL:2,LDLDIRECT:2 in the last 72 hours No results found for this basename: TSH,T4TOTAL,FREET3,T3FREE,THYROIDAB in the last 72 hours No results found for this basename: VITAMINB12:2,FOLATE:2,FERRITIN:2,TIBC:2,IRON:2,RETICCTPCT:2 in the last 72 hours  Micro Results: No results found for this or any previous visit (from the past 240 hour(s)).  Studies/Results: No results found.  Medications: I have reviewed the patient's current medications.  Patient Active Hospital Problem List: Cardiac enzymes elevated (06/10/2011) Cardiology is on board and last troponin was WNL. Has resolved.  Lung mass (06/09/2011) Pt is to get further work up as outpatient as outlined in pulmonology's note. Once patient is ready for discharge will provide information for her to follow up.   Anasarca/nephrotic range proteinuria (06/09/2011) Addendum:  Given patient results of 24 hour urine protein collection of 10 grams, I have decided to consult nephrology for further help in management of patient's condition. I am grateful that they have seen patient today and have placed their recommendations.  Nephrotic syndrome.  Appreciate Nephro's input.  Will follow up with their recommendations.  ACE I once patient can tolerate.  Nephro suggests as outpatient  Diastolic CHF, acute (06/09/2011) Pt is currently on lasix. We are continuing beta blocker.  Tobacco abuse (06/09/2011) Recommend tobacco cessation.   Diabetic retinopathy associated with type 2 diabetes mellitus (06/09/2011) Stable currently.  LOS: 4 days   Penny Pia M.D.  [facility] 06/13/2011, 3:50 PM

## 2011-06-13 NOTE — Progress Notes (Signed)
CSW assessment completed. Pt requested to further review advance directive information and have CSW f/u for notary.  Milus Banister MSW,LCSW w/e Coverage [phone]

## 2011-06-14 DIAGNOSIS — R609 Edema, unspecified: Secondary | ICD-10-CM

## 2011-06-14 LAB — COMPREHENSIVE METABOLIC PANEL WITH GFR
ALT: 13 U/L (ref 0–35)
AST: 10 U/L (ref 0–37)
Albumin: 1 g/dL — ABNORMAL LOW (ref 3.5–5.2)
Alkaline Phosphatase: 62 U/L (ref 39–117)
BUN: 18 mg/dL (ref 6–23)
CO2: 26 meq/L (ref 19–32)
Calcium: 8.9 mg/dL (ref 8.4–10.5)
Chloride: 106 meq/L (ref 96–112)
Creatinine, Ser: 1.13 mg/dL — ABNORMAL HIGH (ref 0.50–1.10)
GFR calc Af Amer: 68 mL/min — ABNORMAL LOW
GFR calc non Af Amer: 59 mL/min — ABNORMAL LOW
Glucose, Bld: 228 mg/dL — ABNORMAL HIGH (ref 70–99)
Potassium: 4.5 meq/L (ref 3.5–5.1)
Sodium: 137 meq/L (ref 135–145)
Total Bilirubin: 0.1 mg/dL — ABNORMAL LOW (ref 0.3–1.2)
Total Protein: 4.4 g/dL — ABNORMAL LOW (ref 6.0–8.3)

## 2011-06-14 LAB — PARATHYROID HORMONE, INTACT (NO CA): PTH: 36 pg/mL (ref 14.0–72.0)

## 2011-06-14 LAB — CBC
Hemoglobin: 11.9 g/dL — ABNORMAL LOW (ref 12.0–15.0)
MCH: 29.7 pg (ref 26.0–34.0)
RBC: 4.01 MIL/uL (ref 3.87–5.11)
WBC: 8 10*3/uL (ref 4.0–10.5)

## 2011-06-14 LAB — GLOMERULAR BASEMENT MEMBRANE ANTIBODIES: GBM Ab: <1 [AU]/ml

## 2011-06-14 LAB — GLUCOSE, CAPILLARY
Glucose-Capillary: 187 mg/dL — ABNORMAL HIGH (ref 70–99)
Glucose-Capillary: 252 mg/dL — ABNORMAL HIGH (ref 70–99)

## 2011-06-14 LAB — ANA: Anti Nuclear Antibody(ANA): NEGATIVE

## 2011-06-14 LAB — MPO/PR-3 (ANCA) ANTIBODIES
Myeloperoxidase Abs: <1 [AU]/ml
Serine Protease 3: <1 [AU]/ml

## 2011-06-14 MED ORDER — LISINOPRIL 2.5 MG PO TABS
2.5000 mg | ORAL_TABLET | Freq: Every day | ORAL | Status: DC
  Administered 2011-06-14 – 2011-06-15 (×2): 2.5 mg via ORAL
  Filled 2011-06-14 (×2): qty 1

## 2011-06-14 NOTE — Progress Notes (Signed)
    SUBJECTIVE: No complaints. No chest pain or SOB. Swelling is better per pt.   BP 143/84  Pulse 94  Temp(Src) 98.6 F (37 C) (Oral)  Resp 20  Ht 5\' 7"  (1.702 m)  Wt 239 lb 1.6 oz (108.455 kg)  BMI 37.45 kg/m2  SpO2 95%  LMP 05/21/2011  Intake/Output Summary (Last 24 hours) at 06/14/11 0730 Last data filed at 06/14/11 0600  Gross per 24 hour  Intake   1680 ml  Output   5850 ml  Net  -4170 ml   Telemetry: NSR  PHYSICAL EXAM General: Well developed, well nourished, in no acute distress. Alert and oriented x 3.  Psych:  Good affect, responds appropriately Neck:. No masses noted.  Lungs: Clear bilaterally with no wheezes or rhonci noted.  Heart: RRR with no murmurs noted. Abdomen: Bowel sounds are present. Soft, non-tender.  Extremities: 2+ bilateral lower extremity edema.   LABS: Basic Metabolic Panel:  Basename 06/13/11 1254 06/13/11 0440 06/12/11 0700  NA -- 135 136  K -- 4.5 4.2  CL -- 105 107  CO2 -- 24 23  GLUCOSE -- 201* 167*  BUN -- 18 17  CREATININE -- 1.09 0.92  CALCIUM -- 8.7 8.4  MG -- -- --  PHOS 5.1* -- --   CBC:  Basename 06/14/11 0620 06/13/11 0440  WBC 8.0 8.6  NEUTROABS -- --  HGB 11.9* 11.5*  HCT 35.5* 34.0*  MCV 88.5 88.1  PLT 404* 359    Current Meds:    . aspirin  325 mg Oral Daily  . enoxaparin  55 mg Subcutaneous Q24H  . furosemide  40 mg Oral BID  . hydrocortisone valerate ointment   Topical BID  . insulin aspart  0-5 Units Subcutaneous QHS  . insulin aspart  0-9 Units Subcutaneous TID WC  . metoprolol succinate  25 mg Oral Daily  . nicotine  21 mg Transdermal Daily  . potassium chloride  20 mEq Oral BID  . sodium chloride  3 mL Intravenous Q12H  . DISCONTD: metoprolol tartrate  25 mg Oral BID     ASSESSMENT AND PLAN:  1. Volume excess: Pt has nephrotic syndrome. Echo with EF of 35-40% but this is not a good study with resting heart rate in 130s during study. Medical management with diuresis  2. LV Systolic  dysfunction: Continue beta blocker and add Ace-inh when tolerated. Repeat echo as an outpt.  3 .Elevated troponin-No chest pain. Only one value elevated. Continue aspirin. No further evaluation at this point.   4. Lung mass: the patient may have lung cancer. Pulm on board with plans for outpt workup.  5. Nephrotic syndrome: Nephrology following. Diuresing well.   We will sign off for now. She can follow up in the [facility] cardiology office with Dr. Olga Millers 3 weeks after discharge. She will need repeat echo at some point in the next several months.     Hannah [name]  2/25/20137:30 AM

## 2011-06-14 NOTE — Progress Notes (Signed)
Clinical Social Worker attempted to follow up with pt in regard to Advanced Directives. Clinical Social Worker visited pt room and pt sleeping at this time. Clinical Social Worker to follow up and assist with Advanced Directives as appropriate.  Jacklynn Lewis, MSW, LCSWA  Clinical Social Work [phone]

## 2011-06-14 NOTE — Progress Notes (Signed)
Subjective: Pt feel better today.  Feels less swollen.  No nausea or emesis reported and is tolerating her po intake well.  No acute issues overnight.  Objective: Filed Vitals:   06/13/11 1400 06/13/11 2058 06/14/11 0518 06/14/11 1504  BP: 119/79 126/79 143/84 111/68  Pulse: 92 85 94 88  Temp: 98.2 F (36.8 C) 97.5 F (36.4 C) 98.6 F (37 C) 98 F (36.7 C)  TempSrc: Oral Oral Oral Oral  Resp: 20 20 20 18   Height:      Weight:   108.455 kg (239 lb 1.6 oz)   SpO2: 98% 96% 95% 98%   Weight change: -1.945 kg (-4 lb 4.6 oz)  Intake/Output Summary (Last 24 hours) at 06/14/11 1724 Last data filed at 06/14/11 1610  Gross per 24 hour  Intake   1680 ml  Output   6500 ml  Net  -4820 ml    General: Alert, awake, oriented x3, in no acute distress.  HEENT: No bruits, no goiter.  Heart: Regular rate and rhythm, without murmurs, rubs, gallops.  Lungs: Clear to auscultation today, No wheezes. Abdomen: Soft, nontender, nondistended, positive bowel sounds.  Neuro: Grossly intact, nonfocal.   Lab Results:  Basename 06/14/11 0620 06/13/11 1254 06/13/11 0440  NA 137 -- 135  K 4.5 -- 4.5  CL 106 -- 105  CO2 26 -- 24  GLUCOSE 228* -- 201*  BUN 18 -- 18  CREATININE 1.13* -- 1.09  CALCIUM 8.9 -- 8.7  MG -- -- --  PHOS -- 5.1* --    Basename 06/14/11 0620  AST 10  ALT 13  ALKPHOS 62  BILITOT 0.1*  PROT 4.4*  ALBUMIN 1.0*   No results found for this basename: LIPASE:2,AMYLASE:2 in the last 72 hours  Basename 06/14/11 0620 06/13/11 0440  WBC 8.0 8.6  NEUTROABS -- --  HGB 11.9* 11.5*  HCT 35.5* 34.0*  MCV 88.5 88.1  PLT 404* 359   No results found for this basename: CKTOTAL:3,CKMB:3,CKMBINDEX:3,TROPONINI:3 in the last 72 hours No components found with this basename: POCBNP:3 No results found for this basename: DDIMER:2 in the last 72 hours No results found for this basename: HGBA1C:2 in the last 72 hours No results found for this basename:  CHOL:2,HDL:2,LDLCALC:2,TRIG:2,CHOLHDL:2,LDLDIRECT:2 in the last 72 hours No results found for this basename: TSH,T4TOTAL,FREET3,T3FREE,THYROIDAB in the last 72 hours  Basename 06/13/11 1254  VITAMINB12 --  FOLATE --  FERRITIN --  TIBC 143*  IRON 32*  RETICCTPCT --    Micro Results: No results found for this or any previous visit (from the past 240 hour(s)).  Studies/Results: No results found.  Medications: I have reviewed the patient's current medications.   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  Will consider discharging patient tomorrow and having her follow up with Nephro as outpatient for further management options.  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 Olga Millers 3 weeks.  Also patient is to get f/u Echo and continue current beta blocker.  Will start lisinopril today at very low dose and see how patient tolerates.  Tobacco abuse (06/09/2011)  Recommend tobacco cessation.  Patient aware and agreeable.  Diabetic retinopathy associated with type 2 diabetes  mellitus (06/09/2011) Stable currently.       LOS: 5 days   Penny Pia M.D.  [facility] 06/14/2011, 5:24 PM

## 2011-06-14 NOTE — Progress Notes (Signed)
Inpatient Diabetes Program Recommendations  AACE/ADA: New Consensus Statement on Inpatient Glycemic Control (2009)  Target Ranges:  Prepandial:   less than 140 mg/dL      Peak postprandial:   less than 180 mg/dL (1-2 hours)      Critically ill patients:  140 - 180 mg/dL   Reason for Visit: Sub-optimal glycemic control  Inpatient Diabetes Program Recommendations Insulin - Basal: May benefit from low dose Lantus while in hospital to improve glycemic control.  A1C is 8.6. Patient may benefit from addition of low dose Lantus for home also.  Note:  Results for ICHELLE, HARRAL (MRN [id number]) as of 06/14/2011 15:13  Ref. Range 06/13/2011 06:22 06/13/2011 11:06 06/13/2011 16:49 06/13/2011 21:01 06/14/2011 06:29 06/14/2011 11:16  Glucose-Capillary Latest Range: 70-99 mg/dL 409 (H) 811 (H) 914 (H) 245 (H) 187 (H) 252 (H)   Thank you.

## 2011-06-14 NOTE — Progress Notes (Signed)
Patient ID: RUBY LOGIUDICE, female   DOB: 10-24-1970, 41 y.o.   MRN: [id number] S:feels better O:BP 143/84  Pulse 94  Temp(Src) 98.6 F (37 C) (Oral)  Resp 20  Ht 5\' 7"  (1.702 m)  Wt 108.455 kg (239 lb 1.6 oz)  BMI 37.45 kg/m2  SpO2 95%  LMP 05/21/2011  Intake/Output Summary (Last 24 hours) at 06/14/11 1457 Last data filed at 06/14/11 1238  Gross per 24 hour  Intake   1680 ml  Output   6400 ml  Net  -4720 ml   Weight change: -1.945 kg (-4 lb 4.6 oz) Gen:WD WN obese WF in NAD CVS:RRR Resp:CTA IEP:PIRJJO Ext:3+edema b/l lower ext   Lab 06/14/11 0620 06/13/11 1254 06/13/11 0440 06/12/11 0700 06/11/11 0510 06/10/11 1414 06/10/11 0301 06/09/11 1326 06/09/11 1302  NA 137 -- 135 136 136 -- 135 138 135  K 4.5 -- 4.5 4.2 4.0 -- 3.6 4.2 4.2  CL 106 -- 105 107 107 -- 106 108 106  CO2 26 -- 24 23 23  -- 24 -- 23  GLUCOSE 228* -- 201* 167* 181* -- 162* 236* 233*  BUN 18 -- 18 17 17  -- 19 23 23   CREATININE 1.13* -- 1.09 0.92 1.03 -- 0.97 0.90 1.12*  ALB -- -- -- -- -- -- -- -- --  CALCIUM 8.9 -- 8.7 8.4 8.2* -- 8.2* -- 7.9*  PHOS -- 5.1* -- -- -- 4.0 -- -- --  AST 10 -- -- -- -- -- -- -- 14  ALT 13 -- -- -- -- -- -- -- 12   Liver Function Tests:  Lab 06/14/11 0620 06/09/11 1302  AST 10 14  ALT 13 12  ALKPHOS 62 79  BILITOT 0.1* 0.1*  PROT 4.4* 4.7*  ALBUMIN 1.0* 0.9*   No results found for this basename: LIPASE:3,AMYLASE:3 in the last 168 hours No results found for this basename: AMMONIA:3 in the last 168 hours CBC:  Lab 06/14/11 0620 06/13/11 0440 06/10/11 0301 06/09/11 1302  WBC 8.0 8.6 7.7 --  NEUTROABS -- -- -- 6.5  HGB 11.9* 11.5* 12.6 --  HCT 35.5* 34.0* 37.9 --  MCV 88.5 88.1 88.3 89.2  PLT 404* 359 377 --   Cardiac Enzymes:  Lab 06/10/11 1115 06/10/11 0301 06/09/11 1925 06/09/11 1302  CKTOTAL 166 182* 205* --  CKMB 6.1* 6.7* 7.4* --  CKMBINDEX -- -- -- --  TROPONINI <0.30 0.44* <0.30 <0.30   CBG:  Lab 06/14/11 1116 06/14/11 0629 06/13/11 2101 06/13/11  1649 06/13/11 1106  GLUCAP 252* 187* 245* 159* 285*    Iron Studies:  Basename 06/13/11 1254  IRON 32*  TIBC 143*  TRANSFERRIN --  FERRITIN --   Studies/Results: No results found.    Marland Kitchen aspirin  325 mg Oral Daily  . enoxaparin  55 mg Subcutaneous Q24H  . furosemide  40 mg Oral BID  . hydrocortisone valerate ointment   Topical BID  . insulin aspart  0-5 Units Subcutaneous QHS  . insulin aspart  0-9 Units Subcutaneous TID WC  . metoprolol succinate  25 mg Oral Daily  . nicotine  21 mg Transdermal Daily  . potassium chloride  20 mEq Oral BID  . sodium chloride  3 mL Intravenous Q12H    BMET    Component Value Date/Time   NA 137 06/14/2011 0620   K 4.5 06/14/2011 0620   CL 106 06/14/2011 0620   CO2 26 06/14/2011 0620   GLUCOSE 228* 06/14/2011 0620   BUN 18 06/14/2011  1610   CREATININE 1.13* 06/14/2011 0620   CALCIUM 8.9 06/14/2011 0620   GFRNONAA 59* 06/14/2011 0620   GFRAA 68* 06/14/2011 0620   CBC    Component Value Date/Time   WBC 8.0 06/14/2011 0620   RBC 4.01 06/14/2011 0620   HGB 11.9* 06/14/2011 0620   HCT 35.5* 06/14/2011 0620   PLT 404* 06/14/2011 0620   MCV 88.5 06/14/2011 0620   MCH 29.7 06/14/2011 0620   MCHC 33.5 06/14/2011 0620   RDW 13.6 06/14/2011 0620   LYMPHSABS 2.4 06/09/2011 1302   MONOABS 0.4 06/09/2011 1302   EOSABS 0.2 06/09/2011 1302   BASOSABS 0.0 06/09/2011 1302     Assessment/Plan:  1. Nephrotic syndrome/volume overload- marked diuresis.  Negative 13L over hospital course.  Underlying etiology possible diabetic nephropathy but also on the differential would be nodular sclerosis from tobacco abuse or secondary membranous from underlying pulmonary malignancy.  Continue with diuresis, na and fluid restriction and arb 2. Pulmonary mass- enlarging and now with lymphadenopathy.  For outpt w/u 3. Diastolic CHF- improving 4. DM- per primary svc 5. HTN- stable. Consider adding verapamil or dilt for antiproteinuric effects 6. Tobacco abuse- on patch 7. dispo-  per primary svc Sydny Schnitzler A

## 2011-06-15 DIAGNOSIS — J189 Pneumonia, unspecified organism: Secondary | ICD-10-CM

## 2011-06-15 LAB — PROTEIN ELECTROPHORESIS, SERUM
Alpha-1-Globulin: 5 % — ABNORMAL HIGH (ref 2.9–4.9)
Alpha-2-Globulin: 31.3 % — ABNORMAL HIGH (ref 7.1–11.8)
Beta 2: 7.2 % — ABNORMAL HIGH (ref 3.2–6.5)
Gamma Globulin: 11.5 % (ref 11.1–18.8)

## 2011-06-15 LAB — GLUCOSE, CAPILLARY: Glucose-Capillary: 210 mg/dL — ABNORMAL HIGH (ref 70–99)

## 2011-06-15 LAB — C4 COMPLEMENT: Complement C4, Body Fluid: 28 mg/dL (ref 10–40)

## 2011-06-15 LAB — ANTISTREPTOLYSIN O TITER: ASO: <25 IU/mL (ref 0–408)

## 2011-06-15 MED ORDER — FUROSEMIDE 40 MG PO TABS: 40.0000 mg | ORAL_TABLET | Freq: Every day | ORAL | Status: DC

## 2011-06-15 MED ORDER — HYDROCORTISONE VALERATE 0.2 % EX OINT: TOPICAL_OINTMENT | Freq: Every day | CUTANEOUS | Status: DC

## 2011-06-15 MED ORDER — METOPROLOL SUCCINATE ER 25 MG PO TB24: 25.0000 mg | ORAL_TABLET | Freq: Every day | ORAL | Status: DC

## 2011-06-15 MED ORDER — LISINOPRIL 2.5 MG PO TABS: 2.5000 mg | ORAL_TABLET | Freq: Every day | ORAL | Status: DC

## 2011-06-15 NOTE — Discharge Summary (Signed)
Admit date: 06/09/2011 Discharge date: 06/15/2011  Primary Care Physician:  Dr. Terrial Rhodes (Nephrology) Dr. Olga Millers (Cardiology) Dr. Delton Coombes (Pulmonology)   Discharge Diagnoses:   Active Hospital Problems  Diagnoses Date Noted   . Nephrotic range proteinuria 06/13/2011   . Lung mass 06/09/2011     Priority: Medium  . Anasarca 06/09/2011   . Diastolic CHF, acute 06/09/2011   . Tobacco abuse 06/09/2011   . Diabetic retinopathy associated with type 2 diabetes mellitus 06/09/2011     Resolved Hospital Problems  Diagnoses Date Noted Date Resolved  . Cardiac enzymes elevated 06/10/2011 06/13/2011    Priority: High  . PNA (pneumonia) 06/09/2011 06/13/2011     DISCHARGE MEDICATION: Medication List  As of 06/15/2011 11:23 AM   STOP taking these medications         HYDROcodone-acetaminophen 5-325 MG per tablet      ibuprofen 200 MG tablet      ondansetron 4 MG disintegrating tablet         TAKE these medications         CYANOCOBALAMIN PO   Take 1 tablet by mouth daily.      furosemide 40 MG tablet   Commonly known as: LASIX   Take 1 tablet (40 mg total) by mouth daily.      glipiZIDE 5 MG tablet   Commonly known as: GLUCOTROL   Take 5 mg by mouth 2 (two) times daily before a meal.      hydrocortisone valerate ointment 0.2 %   Commonly known as: WEST-CORT   Apply topically daily. Apply only to Lower extremities at affected areas.  Wash hands well after application.      lisinopril 2.5 MG tablet   Commonly known as: PRINIVIL,ZESTRIL   Take 1 tablet (2.5 mg total) by mouth daily.      metFORMIN 1000 MG tablet   Commonly known as: GLUCOPHAGE   Take 1,000 mg by mouth 2 (two) times daily.      metoprolol succinate 25 MG 24 hr tablet   Commonly known as: TOPROL-XL   Take 1 tablet (25 mg total) by mouth daily.              Consults: Treatment Team:  Trevor Iha, MD   SIGNIFICANT DIAGNOSTIC STUDIES:  Dg Chest 2 View  06/01/2011  *RADIOLOGY  REPORT*  Clinical Data: Vomiting and wheezing.  Cough.  CHEST - 2 VIEW  Comparison: Two-view chest 03/20/2011.  Findings: Perihilar bronchitic changes are evident bilaterally.  No focal airspace disease is evident.  The heart size is normal.  IMPRESSION: New bronchitic changes compatible with acute bronchitis.  Original Report Authenticated By: Jamesetta Orleans. MATTERN, M.D.   Ct Angio Chest W/cm &/or Wo Cm  06/09/2011  *RADIOLOGY REPORT*  Clinical Data: 41 year old female with shortness of breath, rapid heart rate, swelling.  CT ANGIOGRAPHY CHEST  Technique:  Multidetector CT imaging of the chest using the standard protocol during bolus administration of intravenous contrast. Multiplanar reconstructed images including MIPs were obtained and reviewed to evaluate the vascular anatomy.  Contrast: OMNIPAQUE IOHEXOL 300 MG/ML IV SOLN  Comparison: Chest radiograph from the same day and earlier.  PET-CT 02/10/2010.  Findings: Good contrast bolus timing in the pulmonary arterial tree.  Intermittent respiratory motion artifact. No focal filling defect identified in the pulmonary arterial tree to suggest the presence of acute pulmonary embolism.  Medial basal segment left lower lobe lung mass has mildly increased in size since 2011, now 24 x 21  mm (20 x 17 mm previously at comparable level).  There is superimposed tree in bud nodularity in the right middle lobe, and some peripheral airway nodularity also the lesion.  Major airways are patent.  There is lower lobe peribronchial thickening, including in the airway leading to the left lower lobe mass.  In the abdomen there is new ascites adjacent to the liver and spleen.  Trace cholelithiasis.  Adrenal glands and other visualized upper abdominal viscera are stable.  No pericardial or pleural effusion.  Increased bilateral hilar nodal tissue measures up to 9 mm in short axis.  It is unclear what extent as was present in 2011.  Smaller prevascular, precarinal, and the  right paratracheal lymph nodes are noted.  Negative thyroid and thoracic inlet.  Coronary artery calcifications.  No suspicious osseous lesion.  IMPRESSION: 1. No evidence of acute pulmonary embolus. 2.  Chronic left lower lobe lung mass has increased 4-5 mm in each axial dimension since 2011.  Indolent neoplasm is favored and recommend histologic evaluation of this lesion when possible. 3.  Superimposed acute distal airway infection suspected in the right middle lobe and lingula.   If the patient fails to respond to standard therapy, consider atypical entities such as MAC. 4.  Increased number of hilar and mediastinal lymph nodes, difficult to compare to the noncontrast CT portion of the 2011 study. These could be reactive, but pathology findings in #2 will guide follow-up.  5.  New small volume ascites in the upper abdomen.  Study discussed by telephone with Dr. Mancel Bale on 06/09/2011 at 1650 hours.  Original Report Authenticated By: Harley Hallmark, M.D.   US Abdomen Complete  06/01/2011  *RADIOLOGY REPORT*  Clinical Data:  Emesis for 3 days  COMPLETE ABDOMINAL ULTRASOUND  Comparison:  PET CT 02/10/2010  Findings:  Gallbladder:  No gallstones, gallbladder wall thickening, or pericholecystic fluid.  Common bile duct:  No bile duct dilatation.  Common bile duct measures 4.5 mm diameter.  Liver:  No focal lesion identified.  Within normal limits in parenchymal echogenicity.  IVC:  Appears normal.  Pancreas:  The pancreas is mostly obscured by overlying bowel gas is poorly visualized.  Spleen:  Spleen length measures 9 cm.  Normal homogeneous echotexture.  Right Kidney:  Right kidney measures 13.6 cm.  No hydronephrosis.  Left Kidney:  Left kidney measures 13.3 cm diameter.  Septated cystic lesion off of the lower pole measuring 5.2 x 3.5 x 4 cm. This represents a minimally complex cyst, likely benign.  Abdominal aorta:  Mid and distal aorta is obscured by bowel gas. Visualized proximal aorta is not dilated.   IMPRESSION: Minimally complex left renal cyst.  Visualized abdominal organs are otherwise unremarkable.  Limited study due to bowel gas.  Original Report Authenticated By: Marlon Pel, M.D.   Dg Chest Port 1 View  06/09/2011  *RADIOLOGY REPORT*  Clinical Data: Cough.  Smoking history.  PORTABLE CHEST - 1 VIEW  Comparison: T 03/2012  Findings: Artifact overlies the chest.  There is central bronchial thickening consists of bronchitis.  There is mild patchy infiltrate in the left lower lobe consistent with mild pneumonia.  No effusions.  No bony abnormalities.  IMPRESSION: Bronchitis.  Patchy infiltrate left lower lobe consistent with pneumonia.  Original Report Authenticated By: Thomasenia Sales, M.D.     ECHO: Study Conclusions 06/12/11  - Left ventricle: The cavity size was normal. Wall thickness was increased in a pattern of mild LVH. Systolic function was moderately reduced.  The estimated ejection fraction was in the range of 35% to 40% - assessment difficult in light of resting heart rate in the 130's during the study. Diffuse hypokinesis. More prominent focalhypokinesis of the inferior myocardium. The study is not technically sufficient to allow evaluation of LV diastolic function as there is fusion of E/A with increased heart rate. - Mitral valve: Mild regurgitation. - Tricuspid valve: Trivial regurgitation. - Pulmonic valve: Mild regurgitation. - Pericardium, extracardiac: A trivial pericardial effusion was identified. Transthoracic echocardiography. M-mode, complete 2D, spectral Doppler, and color Doppler. Height: Height: 170.2cm. Height: 67in. Weight: Weight: 113kg. Weight: 248.6lb. Body mass index: BMI: 39kg/m^2. Body surface area: BSA: 2.13m^2. Patient status: Inpatient     No results found for this or any previous visit (from the past 240 hour(s)).  BRIEF ADMITTING H & P:   41 year old Caucasian female patient with history of long-standing type 2 diabetes mellitus,  bilateral diabetic retinopathy status post laser eye surgery and possible vitrectomy, limited vision right greater than left eye, hyperlipidemia, ongoing tobacco abuse presents to the emergency secondary to generalized body edema, dyspnea, and intermittent nausea and vomiting.  She had noticed the edema progressively get worse over 2-3 weeks prior to admission.  Further work-up revealed that patient was fluid overloaded and had a protein urine of 10 grams in a 24 hour period.  As well as a new pulmonary nodule.  One elevated level of troponin which normalized on recheck.  Pulmonology was consulted for the pulmonary nodule and mentioned: previously known with minimal uptake on PET approx 1 year ago. Now increased in size from 20x57mm --> 24x37mm with hilar and mediastinal lymphadenpathy. Concerning for malignancy given significant smoking hx (50 pack years) and documented growth. If indeed malignant, it is a very low grade malignancy as demonstrated by low metabolic activity and slow growth.  Thus it was decided to have further work up as outpatient as indicated below.  Dyspnea/elevated troponin:  Cardiology was consulted.  Likely from CHF which was not well characterized due to poor Echo as inpatient although patient had EF of 35-40 percent.  Recommendation was continue Beta Blocker and add lisinopril as well as repeat Echo as outpatient.  Troponin had normalized on repeat and no further evaluation was recommended. Follow up has been scheduled as indicated below.  Edema:  Secondary to loss of protein Nephrotic syndrome.  Nephrology ordered further tests to evaluate etiology.  In their note they stated: Underlying etiology possible diabetic nephropathy but also on the differential would be nodular sclerosis from tobacco abuse or secondary membranous from underlying pulmonary malignancy. Continue with diuresis, na and fluid restriction and arb.  I have scheduled an appointment for follow up with nephrology as  indicated below.  Patient's condition improved on Lasix, beta blocker, and lisinopril.  I lowered her Lasix dose to 40 mg per day due to a slight increase in her creatinine from 1.09 to 1.13.  Will have her evaluated as outpatient.  Patient knows to weigh herself daily and restrict her sodium intake.  Active Hospital Problems  Diagnoses Date Noted   . Nephrotic range proteinuria 06/13/2011   . Lung mass 06/09/2011     Priority: Medium  . Anasarca 06/09/2011   . Diastolic CHF, acute 06/09/2011   . Tobacco abuse 06/09/2011   . Diabetic retinopathy associated with type 2 diabetes mellitus 06/09/2011     Resolved Hospital Problems  Diagnoses Date Noted Date Resolved  . Cardiac enzymes elevated 06/10/2011 06/13/2011    Priority: High  .  PNA (pneumonia) 06/09/2011 06/13/2011     Disposition and Follow-up: As indicated below.  Patient is aware and vocalizes agreement. Discharge Orders    Future Appointments: Provider: Department: Dept Phone: Center:   06/21/2011 1:00 PM [facility] [phone] None   07/02/2011 3:30 PM Leslye Peer, MD [facility] [phone] None   07/08/2011 12:30 PM Lewayne Bunting, MD [facility] [phone] [facility]     Future Orders Please Complete By Expires   Diet - low sodium heart healthy      Increase activity slowly      Discharge instructions      Comments:   Please follow up with: -[facility]: Dr. Nehemiah Settle on  March 15th 800 AM (due to proteinuria:Nephrotic syndrome) - Dr. Delton Coombes Pulmonologist for recent pulmonary mass at 07/02/11 @ 3:15 Pm: [address] [facility] - Dr. Olga Millers on March 21 at 12:30 Pm at: [address] [place]   Other Restrictions      Comments:   Follow a salt restriction diet and cardiac diet.   Call MD for:  temperature >100.4      Call MD for:  difficulty breathing, headache or visual disturbances      Call MD for:  persistant  dizziness or light-headedness           DISCHARGE EXAM:  General: Alert, awake, oriented x3, in no acute distress. HEENT: No bruits, no goiter. Heart: Regular rate and rhythm, without murmurs, rubs, gallops. Lungs: Clear to auscultation bilaterally. Abdomen: Soft, nontender, nondistended, positive bowel sounds. Extremities: No clubbing cyanosis or + pitting edema (improved from yesterday) with positive pedal pulses. Neuro: Grossly intact, nonfocal. Skin: mild papular rash at anterior legs with erythematous base    Blood pressure 115/62, pulse 90, temperature 98.4 F (36.9 C), temperature source Oral, resp. rate 16, height 5\' 7"  (1.702 m), weight 103.6 kg (228 lb 6.3 oz), last menstrual period 05/21/2011, SpO2 97.00%.   Basename 06/14/11 0620 06/13/11 1254 06/13/11 0440  NA 137 -- 135  K 4.5 -- 4.5  CL 106 -- 105  CO2 26 -- 24  GLUCOSE 228* -- 201*  BUN 18 -- 18  CREATININE 1.13* -- 1.09  CALCIUM 8.9 -- 8.7  MG -- -- --  PHOS -- 5.1* --    Basename 06/14/11 0620  AST 10  ALT 13  ALKPHOS 62  BILITOT 0.1*  PROT 4.4*  ALBUMIN 1.0*   No results found for this basename: LIPASE:2,AMYLASE:2 in the last 72 hours  Basename 06/14/11 0620 06/13/11 0440  WBC 8.0 8.6  NEUTROABS -- --  HGB 11.9* 11.5*  HCT 35.5* 34.0*  MCV 88.5 88.1  PLT 404* 359    Signed: Penny Pia M.D. 06/15/2011, 11:23 AM

## 2011-06-15 NOTE — Progress Notes (Signed)
Patient ID: Hannah [name], female   DOB: 1970-09-29, 41 y.o.   MRN: [id number] S:feels much better O:BP 106/64  Pulse 85  Temp(Src) 97.9 F (36.6 C) (Oral)  Resp 18  Ht 5\' 7"  (1.702 m)  Wt 103.6 kg (228 lb 6.3 oz)  BMI 35.77 kg/m2  SpO2 98%  LMP 05/21/2011  Intake/Output Summary (Last 24 hours) at 06/15/11 1319 Last data filed at 06/15/11 1241  Gross per 24 hour  Intake   1200 ml  Output   3950 ml  Net  -2750 ml   Weight change: -4.855 kg (-10 lb 11.3 oz) Gen:WD obese WF in NAD CVS:RRR Resp:CTA VHQ:IONGEX Ext:3+ edema   Lab 06/14/11 0620 06/13/11 1254 06/13/11 0440 06/12/11 0700 06/11/11 0510 06/10/11 1414 06/10/11 0301 06/09/11 1326 06/09/11 1302  NA 137 -- 135 136 136 -- 135 138 135  K 4.5 -- 4.5 4.2 4.0 -- 3.6 4.2 4.2  CL 106 -- 105 107 107 -- 106 108 106  CO2 26 -- 24 23 23  -- 24 -- 23  GLUCOSE 228* -- 201* 167* 181* -- 162* 236* 233*  BUN 18 -- 18 17 17  -- 19 23 23   CREATININE 1.13* -- 1.09 0.92 1.03 -- 0.97 0.90 1.12*  ALB -- -- -- -- -- -- -- -- --  CALCIUM 8.9 -- 8.7 8.4 8.2* -- 8.2* -- 7.9*  PHOS -- 5.1* -- -- -- 4.0 -- -- --  AST 10 -- -- -- -- -- -- -- 14  ALT 13 -- -- -- -- -- -- -- 12   Liver Function Tests:  Lab 06/14/11 0620 06/09/11 1302  AST 10 14  ALT 13 12  ALKPHOS 62 79  BILITOT 0.1* 0.1*  PROT 4.4* 4.7*  ALBUMIN 1.0* 0.9*   No results found for this basename: LIPASE:3,AMYLASE:3 in the last 168 hours No results found for this basename: AMMONIA:3 in the last 168 hours CBC:  Lab 06/14/11 0620 06/13/11 0440 06/10/11 0301 06/09/11 1302  WBC 8.0 8.6 7.7 --  NEUTROABS -- -- -- 6.5  HGB 11.9* 11.5* 12.6 --  HCT 35.5* 34.0* 37.9 --  MCV 88.5 88.1 88.3 89.2  PLT 404* 359 377 --   Cardiac Enzymes:  Lab 06/10/11 1115 06/10/11 0301 06/09/11 1925 06/09/11 1302  CKTOTAL 166 182* 205* --  CKMB 6.1* 6.7* 7.4* --  CKMBINDEX -- -- -- --  TROPONINI <0.30 0.44* <0.30 <0.30   CBG:  Lab 06/15/11 1109 06/15/11 0659 06/14/11 2111 06/14/11 1621  06/14/11 1116  GLUCAP 210* 244* 254* 228* 252*    Iron Studies:  Basename 06/13/11 1254  IRON 32*  TIBC 143*  TRANSFERRIN --  FERRITIN --   Studies/Results: No results found.    Marland Kitchen aspirin  325 mg Oral Daily  . enoxaparin  55 mg Subcutaneous Q24H  . furosemide  40 mg Oral BID  . hydrocortisone valerate ointment   Topical BID  . insulin aspart  0-5 Units Subcutaneous QHS  . insulin aspart  0-9 Units Subcutaneous TID WC  . lisinopril  2.5 mg Oral Daily  . metoprolol succinate  25 mg Oral Daily  . nicotine  21 mg Transdermal Daily  . potassium chloride  20 mEq Oral BID  . sodium chloride  3 mL Intravenous Q12H    BMET    Component Value Date/Time   NA 137 06/14/2011 0620   K 4.5 06/14/2011 0620   CL 106 06/14/2011 0620   CO2 26 06/14/2011 0620   GLUCOSE 228* 06/14/2011  0620   BUN 18 06/14/2011 0620   CREATININE 1.13* 06/14/2011 0620   CALCIUM 8.9 06/14/2011 0620   GFRNONAA 59* 06/14/2011 0620   GFRAA 68* 06/14/2011 0620   CBC    Component Value Date/Time   WBC 8.0 06/14/2011 0620   RBC 4.01 06/14/2011 0620   HGB 11.9* 06/14/2011 0620   HCT 35.5* 06/14/2011 0620   PLT 404* 06/14/2011 0620   MCV 88.5 06/14/2011 0620   MCH 29.7 06/14/2011 0620   MCHC 33.5 06/14/2011 0620   RDW 13.6 06/14/2011 0620   LYMPHSABS 2.4 06/09/2011 1302   MONOABS 0.4 06/09/2011 1302   EOSABS 0.2 06/09/2011 1302   BASOSABS 0.0 06/09/2011 1302     Assessment/Plan: 1. Nephrotic syndrome/volume overload- marked diuresis. Negative 15L over hospital course. Underlying etiology possible diabetic nephropathy but also on the differential would be nodular sclerosis from tobacco abuse or secondary membranous from underlying pulmonary malignancy. Continue with diuresis, na and fluid restriction and arb 2. Pulmonary mass- enlarging and now with lymphadenopathy. For outpt w/u 3. Diastolic CHF- improving 4. DM- per primary svc 5. HTN- stable. Consider adding verapamil or dilt for antiproteinuric effects 6. Tobacco  abuse- on patch 7. dispo- per primary svc 8. F/u with me on 07/02/11 at 8am  Maripat Borba A

## 2011-06-15 NOTE — Progress Notes (Signed)
Brief Nutrition Note:  RD received a consult for CHF diet education, patient was previously educated by this RD and is documented in care plans. RD checked with patient, no additional  questions about diet. No additional nutrition interventions at this time.   Clarene Duke MARIE

## 2011-06-21 ENCOUNTER — Ambulatory Visit (INDEPENDENT_AMBULATORY_CARE_PROVIDER_SITE_OTHER): Payer: Medicare Other | Admitting: Internal Medicine

## 2011-06-21 DIAGNOSIS — R918 Other nonspecific abnormal finding of lung field: Secondary | ICD-10-CM

## 2011-06-21 DIAGNOSIS — Z720 Tobacco use: Secondary | ICD-10-CM

## 2011-06-21 DIAGNOSIS — R222 Localized swelling, mass and lump, trunk: Secondary | ICD-10-CM

## 2011-06-21 DIAGNOSIS — F17200 Nicotine dependence, unspecified, uncomplicated: Secondary | ICD-10-CM

## 2011-06-21 LAB — PULMONARY FUNCTION TEST

## 2011-06-21 NOTE — Progress Notes (Signed)
PFT done today. 

## 2011-07-02 ENCOUNTER — Ambulatory Visit (INDEPENDENT_AMBULATORY_CARE_PROVIDER_SITE_OTHER)
Admission: RE | Admit: 2011-07-02 | Discharge: 2011-07-02 | Disposition: A | Discharge status: Home / Self Care | Payer: Medicare Other | Source: Ambulatory Visit | Attending: Emergency Medicine | Admitting: Emergency Medicine

## 2011-07-02 ENCOUNTER — Encounter: Payer: Self-pay | Admitting: Emergency Medicine

## 2011-07-02 ENCOUNTER — Ambulatory Visit (INDEPENDENT_AMBULATORY_CARE_PROVIDER_SITE_OTHER): Payer: Medicare Other | Admitting: Emergency Medicine

## 2011-07-02 VITALS — BP 122/76 | HR 87 | Temp 97.6°F | Ht 67.0 in | Wt 214.3 lb

## 2011-07-02 DIAGNOSIS — R918 Other nonspecific abnormal finding of lung field: Secondary | ICD-10-CM

## 2011-07-02 DIAGNOSIS — R222 Localized swelling, mass and lump, trunk: Secondary | ICD-10-CM

## 2011-07-02 NOTE — Assessment & Plan Note (Signed)
LLL mass that is larger on CT scan, ? With a more central peribronchial enlargement as well in the L hilar region. This may be appropriate for primary resection if there is no distant disease.  - PET scan now - refer to TCTS for possible primary resection. If this isn't preferred, then she could likely get a tissue dx via ENB.  - rov Jadden Yim in 1 month

## 2011-07-02 NOTE — Patient Instructions (Signed)
We will set up a PET scan to compare with 2011 We will refer you to Thoracic Surgery to discuss possible primary surgical resection of your left lung lesion.  Follow with Dr Delton Coombes in 1 month

## 2011-07-02 NOTE — Progress Notes (Signed)
41 yo smoker with hx of HTN, CAD, DM, and a pulm nodule identified initially in 2011, without significant uptake on PET done 02/10/10. Now with repeat CT scan done 06/09/11 that shows an increase in size from 20x66mm to 24x40mm.  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    Past Medical History  Diagnosis Date  . Diabetes mellitus   . High cholesterol   . Diabetic retinopathy   . Peripheral neuropathy     "tips of toes"     Family History  Problem Relation Age of Onset  . Coronary artery disease Father   . Asthma Father   . COPD Father   . Lung cancer Mother      History   Social History  . Marital Status: Married    Spouse Name: N/A    Number of Children: 3  . Years of Education: N/A   Occupational History  . Not on file.   Social History Main Topics  . Smoking status: Current Everyday Smoker -- 2.0 packs/day for 25 years    Types: Cigarettes  . Smokeless tobacco: Never Used  . Alcohol Use: No  . Drug Use: Yes    Special: Marijuana     "last time for marijuana ~ 1993"  . Sexually Active: Yes   Other Topics Concern  . Not on file   Social History Narrative  . No narrative on file     Allergies  Allergen Reactions  . Ciprofloxacin Rash     Outpatient Prescriptions Prior to Visit  Medication Sig Dispense Refill  . CYANOCOBALAMIN PO Take 1 tablet by mouth daily.      . furosemide (LASIX) 40 MG tablet Take 1 tablet (40 mg total) by mouth daily.  30 tablet  0  . glipiZIDE (GLUCOTROL) 5 MG tablet Take 5 mg by mouth 2 (two) times daily before a meal.        . hydrocortisone valerate ointment (WEST-CORT) 0.2 % Apply topically daily. Apply only to Lower extremities at affected areas.  Wash hands well after application.  15 g  0  . lisinopril (PRINIVIL,ZESTRIL) 2.5 MG tablet Take 1 tablet (2.5 mg total) by mouth daily.  30 tablet  0  . metFORMIN (GLUCOPHAGE) 1000 MG tablet Take 1,000 mg by mouth 2 (two) times  daily.        . metoprolol succinate (TOPROL-XL) 25 MG 24 hr tablet Take 1 tablet (25 mg total) by mouth daily.  30 tablet  0   EXAM:  Filed Vitals:   07/02/11 1558  BP: 122/76  Pulse: 87  Temp: 97.6 F (36.4 C)   Gen: Pleasant, overweight, in no distress,  normal affect  ENT: No lesions,  mouth clear,  oropharynx clear, no postnasal drip  Neck: No JVD, no TMG, no carotid bruits  Lungs: No use of accessory muscles, no dullness to percussion, clear without rales or rhonchi  Cardiovascular: RRR, heart sounds normal, no murmur or gallops, no peripheral edema  Musculoskeletal: No deformities, no cyanosis or clubbing  Neuro: alert, non focal  Skin: Warm, no lesions or rashes   CT scan 06/09/11 Comparison: Chest radiograph from the same day and earlier. PET-CT  02/10/2010.  Findings: Good contrast bolus timing in the pulmonary arterial  tree. Intermittent respiratory motion artifact. No focal filling  defect identified in the pulmonary arterial tree to suggest the  presence of acute pulmonary embolism.  Medial basal segment left lower lobe  lung mass has mildly increased  in size since 2011, now 24 x 21 mm (20 x 17 mm previously at  comparable level).  There is superimposed tree in bud nodularity in the right middle  lobe, and some peripheral airway nodularity also the lesion. Major  airways are patent. There is lower lobe peribronchial thickening,  including in the airway leading to the left lower lobe mass.  In the abdomen there is new ascites adjacent to the liver and  spleen. Trace cholelithiasis. Adrenal glands and other visualized  upper abdominal viscera are stable.  No pericardial or pleural effusion. Increased bilateral hilar  nodal tissue measures up to 9 mm in short axis. It is unclear what  extent as was present in 2011. Smaller prevascular, precarinal,  and the right paratracheal lymph nodes are noted. Negative thyroid  and thoracic inlet. Coronary artery  calcifications.  No suspicious osseous lesion.  IMPRESSION:  1. No evidence of acute pulmonary embolus.  2. Chronic left lower lobe lung mass has increased 4-5 mm in each  axial dimension since 2011. Indolent neoplasm is favored and  recommend histologic evaluation of this lesion when possible.  3. Superimposed acute distal airway infection suspected in the  right middle lobe and lingula. If the patient fails to respond to  standard therapy, consider atypical entities such as MAC.  4. Increased number of hilar and mediastinal lymph nodes,  difficult to compare to the noncontrast CT portion of the 2011  study. These could be reactive, but pathology findings in #2 will  guide follow-up.  5. New small volume ascites in the upper abdomen.  Lung mass LLL mass that is larger on CT scan, ? With a more central peribronchial enlargement as well in the L hilar region. This may be appropriate for primary resection if there is no distant disease.  - PET scan now - refer to TCTS for possible primary resection. If this isn't preferred, then she could likely get a tissue dx via ENB.  - rov Shell Yandow in 1 month

## 2011-07-06 ENCOUNTER — Encounter: Payer: Self-pay | Admitting: Family Medicine

## 2011-07-06 ENCOUNTER — Other Ambulatory Visit: Payer: Self-pay

## 2011-07-06 ENCOUNTER — Encounter: Payer: Self-pay | Admitting: Cardiothoracic Surgery

## 2011-07-06 ENCOUNTER — Other Ambulatory Visit: Payer: Self-pay | Admitting: Cardiothoracic Surgery

## 2011-07-06 ENCOUNTER — Institutional Professional Consult (permissible substitution) (INDEPENDENT_AMBULATORY_CARE_PROVIDER_SITE_OTHER): Payer: Medicare Other | Admitting: Cardiothoracic Surgery

## 2011-07-06 ENCOUNTER — Ambulatory Visit (INDEPENDENT_AMBULATORY_CARE_PROVIDER_SITE_OTHER): Payer: Medicare Other | Admitting: Family Medicine

## 2011-07-06 VITALS — BP 122/79 | HR 105 | Resp 16 | Ht 67.0 in | Wt 210.0 lb

## 2011-07-06 DIAGNOSIS — R918 Other nonspecific abnormal finding of lung field: Secondary | ICD-10-CM

## 2011-07-06 DIAGNOSIS — E11319 Type 2 diabetes mellitus with unspecified diabetic retinopathy without macular edema: Secondary | ICD-10-CM

## 2011-07-06 DIAGNOSIS — R222 Localized swelling, mass and lump, trunk: Secondary | ICD-10-CM

## 2011-07-06 DIAGNOSIS — E785 Hyperlipidemia, unspecified: Secondary | ICD-10-CM

## 2011-07-06 DIAGNOSIS — R609 Edema, unspecified: Secondary | ICD-10-CM

## 2011-07-06 DIAGNOSIS — N049 Nephrotic syndrome with unspecified morphologic changes: Secondary | ICD-10-CM

## 2011-07-06 DIAGNOSIS — R601 Generalized edema: Secondary | ICD-10-CM

## 2011-07-06 DIAGNOSIS — E119 Type 2 diabetes mellitus without complications: Secondary | ICD-10-CM

## 2011-07-06 DIAGNOSIS — F17200 Nicotine dependence, unspecified, uncomplicated: Secondary | ICD-10-CM

## 2011-07-06 DIAGNOSIS — C7931 Secondary malignant neoplasm of brain: Secondary | ICD-10-CM

## 2011-07-06 DIAGNOSIS — E1139 Type 2 diabetes mellitus with other diabetic ophthalmic complication: Secondary | ICD-10-CM

## 2011-07-06 DIAGNOSIS — Z720 Tobacco use: Secondary | ICD-10-CM

## 2011-07-06 DIAGNOSIS — G609 Hereditary and idiopathic neuropathy, unspecified: Secondary | ICD-10-CM

## 2011-07-06 DIAGNOSIS — G629 Polyneuropathy, unspecified: Secondary | ICD-10-CM

## 2011-07-06 DIAGNOSIS — D381 Neoplasm of uncertain behavior of trachea, bronchus and lung: Secondary | ICD-10-CM

## 2011-07-06 DIAGNOSIS — H579 Unspecified disorder of eye and adnexa: Secondary | ICD-10-CM

## 2011-07-06 DIAGNOSIS — I10 Essential (primary) hypertension: Secondary | ICD-10-CM

## 2011-07-06 MED ORDER — METOPROLOL SUCCINATE ER 25 MG PO TB24: 25.0000 mg | ORAL_TABLET | Freq: Every day | ORAL | Status: DC

## 2011-07-06 MED ORDER — FUROSEMIDE 40 MG PO TABS: 40.0000 mg | ORAL_TABLET | Freq: Every day | ORAL | Status: DC

## 2011-07-06 MED ORDER — METFORMIN HCL 1000 MG PO TABS: 1000.0000 mg | ORAL_TABLET | Freq: Two times a day (BID) | ORAL | Status: DC

## 2011-07-06 MED ORDER — LISINOPRIL 2.5 MG PO TABS: 2.5000 mg | ORAL_TABLET | Freq: Every day | ORAL | Status: DC

## 2011-07-06 NOTE — Patient Instructions (Addendum)
I will review your records from your kidney doctor and previous physician Continue your current medication  Take the glucotrol 2 tablets twice a day  We will need a physical- PAP Smear and Mammogram- schedule out 3 months  Bring your blood sugar log to the next appt I will refer you to a podiatrist and Eye doctor [facility])  F/U 6 weeks

## 2011-07-06 NOTE — Progress Notes (Signed)
  Subjective:    Patient ID: Hannah [name], female    DOB: 08/06/1970, 41 y.o.   MRN: [id number]  HPI  Patient here to establish care. Previous PCP [facility] Dr. Tyron Russell kidney - Dr. Larrie Kass Pulmonary- Dr. Delton Coombes Cards- Dr. Jens Som  Patient is status post admission secondary to CHF with anasarca and nephrotic syndrome.  CHF- new diagnosis of systolic CHF with an ejection fraction of 35%. She is currently maintained on Lasix and beta blocker. She is doing well denies shortness of breath. She does follow with her cardiologist this Thursday.  Lung mass- patient has long-term history of lung nodule which has been changing in size. She is due for a PET scan next week. She will followup with Dr. Morton Peters cardiothoracic surgeon this afternoon to make a decision on resection of the lung mass versus biopsy  Nephrotic syndrome- patient presented with anasarca and chronic kidney failure. At this time they're waiting to see results regarding the lung mass. Otherwise there are proceed with a renal biopsy. Currently she is on ACE inhibitor and her creatinine had returned to baseline by discharge. She had labs performed by nephrologist last week which I will obtain.  Diabetes mellitus- history of diabetes mellitus since her last child approximately 15 years ago. Her fastings range 150-180. Her last A1c was 8.6% on June 10, 2011. She's currently on metformin and glipizide. She has history of diabetic retinopathy and was seen by week for his a few years ago she's had 3 surgeries on her eyes. She is establish with a new eye doctor in [facility]. Podiatrist needed  Foot ulcer- she has a long-standing callus on her right toe which bleeds if she walks long periods of time. This was checked the hospital and was found to not be infectious  Due for PAP Smear Due for Mammogram   Review of Systems  GEN- denies fatigue, fever, weight loss,weakness, recent  illness HEENT- denies eye drainage, change in vision, nasal discharge, CVS- denies chest pain, palpitations, +leg edema RESP- denies SOB, cough, wheeze ABD- denies N/V, change in stools, abd pain GU- denies dysuria, hematuria, dribbling, incontinence MSK- denies joint pain, muscle aches, injury Neuro- denies headache, dizziness, syncope, seizure activity      Objective:   Physical Exam GEN- NAD, alert and oriented x3 HEENT- PERRL, EOMI, non injected sclera, pink conjunctiva, MMM, oropharynx clear, unable to see out of right eye Neck- Supple, +right carotid bruit, no thyromegaly CVS- RRR, no murmur, no JVD RESP-CTAB ABD- NABS,soft, NT,ND EXT- 1+pitting edema , venous stasis changes Right foot great toe- thickened callus with small ulcer at the center, bleeding beneath the skin, no erythema or warmth to area, no discharge Pulses- Radial, DP- 2+        Assessment & Plan:

## 2011-07-06 NOTE — Progress Notes (Signed)
PCP is Milinda Antis, MD, MD Referring Provider is Leslye Peer, MD  Chief Complaint  Patient presents with  . Lung Mass    eval and treat..CTA done 06/09/11.Marland KitchenMarland KitchenPET is scheduled for 07/14/11                    [address]            [place] [id number]          [phone]      HPI: The patient is a 41 year old Caucasian diabetic smoker with a 2.4 cm left lower lobe mass which has increased in size in 2011 at which time it was 2.0 cm with a low-level of metabolic activity on PET scan. Unfortunately the patient continued to smoke 2 packs per day until she was admitted to the heart failure for last month with severe anasarca, tachycardia, shortness of breath, and nephrotic syndrome-10 g of protein per 24 hours in urine. She had been previously evaluated by Dr. Delton Coombes for the left lung pulmonary mass and a followup CTA was performed showing the mass had increased up to 2.4 cm. A repeat PET scan has been scheduled by Dr. Delton Coombes next week. The patient is cut her smoking back to one half pack per day. She denies any cough or hemoptysis. She has not been placed on prednisone or had a kidney biopsy. She has been placed on high-dose Lasix and has lost 30 pounds of fluid since hospitalization for nephrotic syndrome. Her creatinine and BUN and contained fairly normal range. The patient has not had a brain CT scan to finish clinical staging.  The patient's mother did die from non-small cell lung cancer 3 years ago and was a smoker.  The patient had PFTs performed by Dr. Ortencia Kick showing FVC 3.6 FEV1 2.6 DLCO 69%-mild obstructive airway disease with positive response to bronchodilator   Past Medical History  Diagnosis Date  . Diabetes mellitus   . High cholesterol   . Diabetic retinopathy   . Peripheral neuropathy     "tips of toes"  . Blind right eye     Past Surgical History  Procedure Date  . Cesarean section 1991; 1994  . Breast surgery   . Eye surgery   . Breast cyst  incision and drainage ?1993    left  . Tubal ligation 1994  . Vitrectomy     bilaterally "right eye once; left eye twice"    Family History  Problem Relation Age of Onset  . Coronary artery disease Father   . Asthma Father   . COPD Father   . Hypertension Father   . Hyperlipidemia Father   . Diabetes Father   . Lung cancer Mother   . Hypertension Mother   . Hyperlipidemia Mother   . Diabetes Mother   . Cancer Mother     lung    Social History History  Substance Use Topics  . Smoking status: Current Everyday Smoker -- 0.5 packs/day for 25 years    Types: Cigarettes  . Smokeless tobacco: Never Used  . Alcohol Use: No    Current Outpatient Prescriptions  Medication Sig Dispense Refill  . aspirin 325 MG tablet 1/2 tab daily as needed      . CYANOCOBALAMIN PO Take 1 tablet by mouth daily.      . furosemide (LASIX) 40 MG tablet Take 1 tablet (40 mg total) by mouth daily.  90 tablet  1  . glipiZIDE (GLUCOTROL) 5 MG tablet Take  10 mg by mouth 2 (two) times daily before a meal.       . hydrocortisone valerate ointment (WEST-CORT) 0.2 % Apply topically daily. Apply only to Lower extremities at affected areas.  Wash hands well after application.  15 g  0  . lisinopril (PRINIVIL,ZESTRIL) 2.5 MG tablet Take 1 tablet (2.5 mg total) by mouth daily.  90 tablet  1  . metFORMIN (GLUCOPHAGE) 1000 MG tablet Take 1 tablet (1,000 mg total) by mouth 2 (two) times daily.  240 tablet  1  . metoprolol succinate (TOPROL-XL) 25 MG 24 hr tablet Take 1 tablet (25 mg total) by mouth daily.  90 tablet  1    Allergies  Allergen Reactions  . Ciprofloxacin Rash    Review of Systems Gen.-Significant weight loss associated with diuresis following diagnosis of nephrotic syndrome, probably secondary to diabetes versus the lung tumor HEENT-no dental complaints she is blind in the right eye from retinopathy Thorax-no chest trauma no recent symptoms of pneumonia no history of rib fracture Cardiac-2-D  echocardiogram performed when she was hospitalized with heart failure showing EF 40, LVH, no significant valve disease but the patient was very tachycardic with heart rate 130 limiting the echo information Abdomen history of gallstones no cholecystectomy Vascular no DVT claudication Neurologic no stroke or seizure Hematologic no bleeding disorder no blood transfusion  BP 122/79  Pulse 105  Resp 16  Ht 5\' 7"  (1.702 m)  Wt 210 lb (95.255 kg)  BMI 32.89 kg/m2  SpO2 97%  LMP 06/24/2011 Physical Exam General appearance-obese middle-aged white female no acute distress, odor of tobacco HEENT right pupil slightly dilated dentition adequate Neck no JVD or palpable adenopathy Thorax no deformity tenderness breath sounds equal and clear Cardiac heart rate 100 no murmur no gallop Abdomen soft nontender obese Extremities mild ankle edema nonpalpable pedal pulses no calf tenderness no clubbing or cyanosis Neurologic no focal motor deficit, normal gait  Diagnostic Tests: CT scan PFTs reviewed as noted above. 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 Recommended primary resection instead of initial biopsy. We'll followup results of PET scan and brain CT scan and scheduled for surgery April 2.  Plan: The patient was strongly recommended to stop smoking completely prior to left VATS and resection of left lower lobe mass on April 2 in order to reduce risk of postoperative pneumonia and multisystem failure which was discussed with patient.

## 2011-07-06 NOTE — Patient Instructions (Signed)
Continue all current medications until surgery Stop smoking before surgery to reduce risk of postoperative pneumonia

## 2011-07-07 DIAGNOSIS — E119 Type 2 diabetes mellitus without complications: Secondary | ICD-10-CM | POA: Insufficient documentation

## 2011-07-07 DIAGNOSIS — E785 Hyperlipidemia, unspecified: Secondary | ICD-10-CM | POA: Insufficient documentation

## 2011-07-07 NOTE — Assessment & Plan Note (Signed)
encourage cessation, pt has cut back a lot on tobacco use

## 2011-07-07 NOTE — Assessment & Plan Note (Signed)
She has very high cholesterol, I would plan to start a statin in the near future, she has follow-up with cardiology, and I would assume in the next few months she should be okay to start this.

## 2011-07-07 NOTE — Assessment & Plan Note (Signed)
PET scan scheduled, will f/u pulmonary

## 2011-07-07 NOTE — Assessment & Plan Note (Signed)
Increase glipizide to maximum dose 10mg  BID

## 2011-07-07 NOTE — Assessment & Plan Note (Signed)
Refer to opthalmology in [place] for continued care

## 2011-07-07 NOTE — Assessment & Plan Note (Addendum)
Improved, , secondary to nephrotic syndrome, followed by nephrology. She still has significant LE edema, however with her tenative renal function I will not change her diuretics today

## 2011-07-08 ENCOUNTER — Ambulatory Visit (INDEPENDENT_AMBULATORY_CARE_PROVIDER_SITE_OTHER): Payer: Medicare Other | Admitting: Cardiology

## 2011-07-08 ENCOUNTER — Encounter: Payer: Self-pay | Admitting: Cardiology

## 2011-07-08 DIAGNOSIS — R222 Localized swelling, mass and lump, trunk: Secondary | ICD-10-CM

## 2011-07-08 DIAGNOSIS — E785 Hyperlipidemia, unspecified: Secondary | ICD-10-CM

## 2011-07-08 DIAGNOSIS — R918 Other nonspecific abnormal finding of lung field: Secondary | ICD-10-CM

## 2011-07-08 DIAGNOSIS — F17200 Nicotine dependence, unspecified, uncomplicated: Secondary | ICD-10-CM

## 2011-07-08 DIAGNOSIS — I429 Cardiomyopathy, unspecified: Secondary | ICD-10-CM | POA: Insufficient documentation

## 2011-07-08 DIAGNOSIS — R601 Generalized edema: Secondary | ICD-10-CM

## 2011-07-08 DIAGNOSIS — R809 Proteinuria, unspecified: Secondary | ICD-10-CM

## 2011-07-08 DIAGNOSIS — Z720 Tobacco use: Secondary | ICD-10-CM

## 2011-07-08 DIAGNOSIS — I428 Other cardiomyopathies: Secondary | ICD-10-CM

## 2011-07-08 DIAGNOSIS — R609 Edema, unspecified: Secondary | ICD-10-CM

## 2011-07-08 NOTE — Assessment & Plan Note (Signed)
Markedly improved with Lasix. Most likely related to nephrotic syndrome.

## 2011-07-08 NOTE — Assessment & Plan Note (Signed)
Management per primary care. 

## 2011-07-08 NOTE — Assessment & Plan Note (Signed)
Scheduled for resection. Myoview preoperatively.

## 2011-07-08 NOTE — Assessment & Plan Note (Signed)
Patient counseled on discontinuing. 

## 2011-07-08 NOTE — Assessment & Plan Note (Addendum)
Continue ACE inhibitor and beta blocker. Plan Myoview to exclude ischemia. Continue present dose of Lasix. Check potassium and renal function. Volume status is markedly improved. Repeat echocardiogram in approximately 3 months to see if LV function improved.

## 2011-07-08 NOTE — Patient Instructions (Signed)
Your physician recommends that you schedule a follow-up appointment in: 3 months  Your physician has requested that you have a lexiscan myoview. For further information please visit [email or web address] Please follow instruction sheet, as given.   Your physician recommends that you return for lab work in: TODAY

## 2011-07-08 NOTE — Assessment & Plan Note (Signed)
Management per nephrology. They apparently feel this may be related to either her lung tumor or diabetes mellitus.

## 2011-07-08 NOTE — Progress Notes (Signed)
HPI: 41 year old female for followup of cardiomyopathy and volume excess. Recently admitted to [facility] with volume excess. Echocardiogram in February of 2013 showed an ejection fraction of 35-40% but difficult to assess because of tachycardia. There was mild mitral regurgitation. Patient was diagnosed with nephrotic syndrome. She was diuresed with significant improvement and the plan was to repeat echocardiogram on medications as an outpatient. Also to fu with nephrology. CTA showed no pulmonary embolus but there was a left lower lobe mass. Patient has been seen by Dr. Donata Clay and is scheduled for lung resection for probable malignancy. A PET scan and head CT are pending. Since she was discharged she has dyspnea with more extreme activities but not routine activities. No orthopnea or PND. She continues to have improvement in her pedal edema. No chest pain or syncope.  Current Outpatient Prescriptions  Medication Sig Dispense Refill  . aspirin 325 MG tablet 1/2 tab daily as needed      . CYANOCOBALAMIN PO Take 1,000 mcg by mouth daily.       . furosemide (LASIX) 40 MG tablet Take 1 tablet (40 mg total) by mouth daily.  90 tablet  1  . glipiZIDE (GLUCOTROL) 5 MG tablet Take 10 mg by mouth 2 (two) times daily before a meal.       . hydrocortisone valerate ointment (WEST-CORT) 0.2 % Apply topically daily. Apply only to Lower extremities at affected areas.  Wash hands well after application.  15 g  0  . lisinopril (PRINIVIL,ZESTRIL) 2.5 MG tablet Take 1 tablet (2.5 mg total) by mouth daily.  90 tablet  1  . metFORMIN (GLUCOPHAGE) 1000 MG tablet Take 1 tablet (1,000 mg total) by mouth 2 (two) times daily.  240 tablet  1  . metoprolol succinate (TOPROL-XL) 25 MG 24 hr tablet Take 1 tablet (25 mg total) by mouth daily.  90 tablet  1     Past Medical History  Diagnosis Date  . Diabetes mellitus   . High cholesterol   . Diabetic retinopathy   . Peripheral neuropathy     "tips of toes"    . Blind right eye   . Nephrotic syndrome   . Lung mass     Past Surgical History  Procedure Date  . Cesarean section 1991; 1994  . Breast surgery   . Eye surgery   . Breast cyst incision and drainage ?1993    left  . Tubal ligation 1994  . Vitrectomy     bilaterally "right eye once; left eye twice"    History   Social History  . Marital Status: Married    Spouse Name: N/A    Number of Children: 3  . Years of Education: N/A   Occupational History  . Not on file.   Social History Main Topics  . Smoking status: Current Everyday Smoker -- 0.5 packs/day for 25 years    Types: Cigarettes  . Smokeless tobacco: Never Used  . Alcohol Use: No  . Drug Use: Yes    Special: Marijuana     "last time for marijuana ~ 1993"  . Sexually Active: Yes   Other Topics Concern  . Not on file   Social History Narrative  . No narrative on file    ROS: no fevers or chills, productive cough, hemoptysis, dysphasia, odynophagia, melena, hematochezia, dysuria, hematuria, rash, seizure activity, orthopnea, PND, claudication. Remaining systems are negative.  Physical Exam: Well-developed well-nourished in no acute distress.  Skin is warm and dry.  HEENT is normal.  Neck is supple. No thyromegaly.  Chest is clear to auscultation with normal expansion.  Cardiovascular exam is regular rate and rhythm. 1/6 systolic murmur left sternal border Abdominal exam nontender or distended. No masses palpated. Extremities show 1+ edema. neuro grossly intact

## 2011-07-09 ENCOUNTER — Encounter (HOSPITAL_COMMUNITY): Payer: Self-pay | Admitting: Pharmacy Technician

## 2011-07-09 LAB — BASIC METABOLIC PANEL
CO2: 26 mEq/L (ref 19–32)
Calcium: 8.5 mg/dL (ref 8.4–10.5)
GFR: 55.28 mL/min — ABNORMAL LOW (ref >60.00)
Glucose, Bld: 290 mg/dL — ABNORMAL HIGH (ref 70–99)
Potassium: 5.4 mEq/L — ABNORMAL HIGH (ref 3.5–5.1)
Sodium: 132 mEq/L — ABNORMAL LOW (ref 135–145)

## 2011-07-12 ENCOUNTER — Ambulatory Visit (HOSPITAL_COMMUNITY): Payer: Medicare Other | Attending: Internal Medicine | Admitting: Radiology

## 2011-07-12 ENCOUNTER — Other Ambulatory Visit: Payer: Self-pay | Admitting: *Deleted

## 2011-07-12 DIAGNOSIS — R0602 Shortness of breath: Secondary | ICD-10-CM | POA: Insufficient documentation

## 2011-07-12 DIAGNOSIS — E875 Hyperkalemia: Secondary | ICD-10-CM

## 2011-07-12 DIAGNOSIS — E119 Type 2 diabetes mellitus without complications: Secondary | ICD-10-CM | POA: Insufficient documentation

## 2011-07-12 DIAGNOSIS — R000 Tachycardia, unspecified: Secondary | ICD-10-CM | POA: Insufficient documentation

## 2011-07-12 DIAGNOSIS — Z01810 Encounter for preprocedural cardiovascular examination: Secondary | ICD-10-CM | POA: Insufficient documentation

## 2011-07-12 DIAGNOSIS — R0609 Other forms of dyspnea: Secondary | ICD-10-CM | POA: Insufficient documentation

## 2011-07-12 DIAGNOSIS — I428 Other cardiomyopathies: Secondary | ICD-10-CM | POA: Insufficient documentation

## 2011-07-12 DIAGNOSIS — E785 Hyperlipidemia, unspecified: Secondary | ICD-10-CM | POA: Insufficient documentation

## 2011-07-12 DIAGNOSIS — R0989 Other specified symptoms and signs involving the circulatory and respiratory systems: Secondary | ICD-10-CM | POA: Insufficient documentation

## 2011-07-12 MED ORDER — REGADENOSON 0.4 MG/5ML IV SOLN
0.4000 mg | Freq: Once | INTRAVENOUS | Status: AC
  Administered 2011-07-12: 0.4 mg via INTRAVENOUS

## 2011-07-12 MED ORDER — TECHNETIUM TC 99M TETROFOSMIN IV KIT
30.0000 | PACK | Freq: Once | INTRAVENOUS | Status: AC | PRN
  Administered 2011-07-12: 30 via INTRAVENOUS

## 2011-07-12 MED ORDER — TECHNETIUM TC 99M TETROFOSMIN IV KIT
10.0000 | PACK | Freq: Once | INTRAVENOUS | Status: AC | PRN
  Administered 2011-07-12: 10 via INTRAVENOUS

## 2011-07-12 NOTE — Progress Notes (Signed)
[facility] SITE 3 NUCLEAR MED [address] [place] [address] [phone]  Cardiology Nuclear Med Study  MARCHE HOTTENSTEIN is a 41 y.o. female     MRN : [id number]     DOB: 03/19/1971  Procedure Date: 07/12/2011  Nuclear Med Background Indication for Stress Test:  Evaluation for Ischemia and Surgical Clearance for lung resection with Dr.Van Trigt on 4/2 History: Cardiomyopathy, 05/2011 ECHO: EF: 35-40% mild MR Cardiac Risk Factors: Lipids and NIDDM  Symptoms:  DOE, Rapid HR and SOB  clear Nuclear Pre-Procedure Caffeine/Decaff Intake:  9:00pm NPO After: 8:00pm   Lungs:  clear O2 Sat: 97% on room air. IV 0.9% NS with Angio Cath:  22g  IV Site: R Wrist  IV Started by:  Cathlyn Parsons, RN  Chest Size (in):  40 Cup Size: C  Height: 5\' 7"  (1.702 m)  Weight:  207 lb (93.895 kg)  BMI:  Body mass index is 32.42 kg/(m^2). Tech Comments:  Toprol held x 24 hrs    Nuclear Med Study 1 or 2 day study: 1 day  Stress Test Type:  Lexiscan  Reading MD: Dietrich Pates, MD  Order Authorizing Provider:  Ripley Fraise  Resting Radionuclide: Technetium 72m Tetrofosmin  Resting Radionuclide Dose: 10.8 mCi   Stress Radionuclide:  Technetium 65m Tetrofosmin  Stress Radionuclide Dose: 33.0 mCi           Stress Protocol Rest HR: 90 Stress HR: 104  Rest BP: 116/73 Stress BP: 148/65  Exercise Time (min): n/a METS: n/a   Predicted Max HR: 177 bpm % Max HR: 58.76 bpm Rate Pressure Product: 16010   Dose of Adenosine (mg):  n/a Dose of Lexiscan: 0.4 mg  Dose of Atropine (mg): n/a Dose of Dobutamine: n/a mcg/kg/min (at max HR)  Stress Test Technologist: Milana Na, EMT-P  Nuclear Technologist:  Doyne Keel, CNMT     Rest Procedure:  Myocardial perfusion imaging was performed at rest 45 minutes following the intravenous administration of Technetium 16m Tetrofosmin. Rest ECG: NSR - Normal EKG  Stress Procedure:  The patient received IV Lexiscan 0.4 mg over 15-seconds.   Technetium 53m Tetrofosmin injected at 30-seconds.  There were no significant changes, nausea, and weakness with Lexiscan.  Quantitative spect images were obtained after a 45 minute delay. Stress ECG: No significant change from baseline ECG  QPS Raw Data Images: Stress Images:There is decreased uptake in the anterior wall and inferior wall.   Rest Images:  There is decreased uptake in the inferior wall, less prominent compared to the stress images. Subtraction (SDS):  These findings are consistent with inferior thinning and mild inferior and anterior ischemia. Transient Ischemic Dilatation (Normal <1.22):  1.10 Lung/Heart Ratio (Normal <0.45):  0.34  Quantitative Gated Spect Images QGS EDV:  113 ml QGS ESV:  58ml  Impression Exercise Capacity:  Lexiscan with low level exercise. BP Response:  Normal blood pressure response. Clinical Symptoms:  No significant symptoms noted. ECG Impression:  No significant ST segment change suggestive of ischemia. Comparison with Prior Nuclear Study: No prior study to compare.  Overall Impression:  Abnormal stress nuclear study with a small, moderate intensity, reversible anterior defect and a small, moderate intensity, partially reversible inferior defect; findings consistent with inferior thinning and mild ischemia in the anterior and inferior wall.  LV Ejection Fraction: 49%.  LV Wall Motion:  Normal Wall Motion   Hannah [name]

## 2011-07-13 ENCOUNTER — Other Ambulatory Visit: Payer: Self-pay | Admitting: Cardiology

## 2011-07-14 ENCOUNTER — Ambulatory Visit (HOSPITAL_COMMUNITY)
Admission: RE | Admit: 2011-07-14 | Discharge: 2011-07-14 | Disposition: A | Discharge status: Home / Self Care | Payer: Medicare Other | Source: Ambulatory Visit | Attending: Cardiothoracic Surgery | Admitting: Cardiothoracic Surgery

## 2011-07-14 ENCOUNTER — Ambulatory Visit (HOSPITAL_COMMUNITY)
Admission: RE | Admit: 2011-07-14 | Discharge: 2011-07-14 | Disposition: A | Discharge status: Home / Self Care | Payer: Medicare Other | Source: Ambulatory Visit | Attending: Emergency Medicine | Admitting: Emergency Medicine

## 2011-07-14 DIAGNOSIS — C7931 Secondary malignant neoplasm of brain: Secondary | ICD-10-CM

## 2011-07-14 DIAGNOSIS — D381 Neoplasm of uncertain behavior of trachea, bronchus and lung: Secondary | ICD-10-CM

## 2011-07-14 DIAGNOSIS — R918 Other nonspecific abnormal finding of lung field: Secondary | ICD-10-CM

## 2011-07-14 LAB — BASIC METABOLIC PANEL WITH GFR
CO2: 25 mEq/L (ref 19–32)
Calcium: 7.9 mg/dL — ABNORMAL LOW (ref 8.4–10.5)
Chloride: 106 mEq/L (ref 96–112)
GFR, Est Non African American: 71 mL/min
Sodium: 136 mEq/L (ref 135–145)

## 2011-07-15 ENCOUNTER — Ambulatory Visit (INDEPENDENT_AMBULATORY_CARE_PROVIDER_SITE_OTHER): Payer: Medicare Other | Admitting: Cardiology

## 2011-07-15 ENCOUNTER — Encounter: Payer: Self-pay | Admitting: Cardiology

## 2011-07-15 ENCOUNTER — Encounter: Payer: Self-pay | Admitting: *Deleted

## 2011-07-15 ENCOUNTER — Other Ambulatory Visit: Payer: Self-pay | Admitting: Cardiology

## 2011-07-15 ENCOUNTER — Telehealth: Payer: Self-pay | Admitting: *Deleted

## 2011-07-15 VITALS — BP 132/78 | HR 96 | Ht 67.0 in | Wt 210.0 lb

## 2011-07-15 DIAGNOSIS — R9430 Abnormal result of cardiovascular function study, unspecified: Secondary | ICD-10-CM

## 2011-07-15 DIAGNOSIS — I059 Rheumatic mitral valve disease, unspecified: Secondary | ICD-10-CM

## 2011-07-15 DIAGNOSIS — R0989 Other specified symptoms and signs involving the circulatory and respiratory systems: Secondary | ICD-10-CM

## 2011-07-15 DIAGNOSIS — N289 Disorder of kidney and ureter, unspecified: Secondary | ICD-10-CM

## 2011-07-15 DIAGNOSIS — E7800 Pure hypercholesterolemia, unspecified: Secondary | ICD-10-CM

## 2011-07-15 LAB — CBC WITH DIFFERENTIAL/PLATELET
Basophils Relative: 0.7 % (ref 0.0–3.0)
Eosinophils Absolute: 0.2 10*3/uL (ref 0.0–0.7)
Eosinophils Relative: 2.6 % (ref 0.0–5.0)
Lymphocytes Relative: 22.4 % (ref 12.0–46.0)
MCHC: 33.9 g/dL (ref 30.0–36.0)
Neutrophils Relative %: 70 % (ref 43.0–77.0)
RBC: 4.3 Mil/uL (ref 3.87–5.11)
WBC: 9.3 10*3/uL (ref 4.5–10.5)

## 2011-07-15 LAB — BASIC METABOLIC PANEL
CO2: 24 mEq/L (ref 19–32)
GFR: 58.21 mL/min — ABNORMAL LOW (ref >60.00)
Glucose, Bld: 326 mg/dL — ABNORMAL HIGH (ref 70–99)
Potassium: 4.7 mEq/L (ref 3.5–5.1)
Sodium: 133 mEq/L — ABNORMAL LOW (ref 135–145)

## 2011-07-15 LAB — PROTIME-INR
INR: 0.9 ratio (ref 0.8–1.0)
Prothrombin Time: 9.3 s — ABNORMAL LOW (ref 10.2–12.4)

## 2011-07-15 MED ORDER — PRAVASTATIN SODIUM 40 MG PO TABS: 40.0000 mg | ORAL_TABLET | Freq: Every evening | ORAL | Status: DC

## 2011-07-15 NOTE — Assessment & Plan Note (Signed)
Scheduled for resection next week.

## 2011-07-15 NOTE — Telephone Encounter (Signed)
Message copied by Freddi Starr on Thu Jul 15, 2011  3:05 PM ------      Message from: Lewayne Bunting      Created: Thu Jul 15, 2011 12:47 PM       Increase Po fluid intake prior to cath      Check BMET Monday 07/19/11      Olga Millers

## 2011-07-15 NOTE — Assessment & Plan Note (Signed)
Add Pravachol 40 mg daily. Check lipids and liver in 6 weeks. 

## 2011-07-15 NOTE — Progress Notes (Signed)
 HPI:41-year-old female for followup of cardiomyopathy and abnormal myoview. Recently admitted to [facility] with volume excess. Echocardiogram in February of 2013 showed an ejection fraction of 35-40% but difficult to assess because of tachycardia. There was mild mitral regurgitation. Patient was diagnosed with nephrotic syndrome. She was diuresed with significant improvement and the plan was to repeat echocardiogram on medications as an outpatient. Also to fu with nephrology. CTA showed no pulmonary embolus but there was a left lower lobe mass. Patient has been seen by Dr. Van Trigt and is scheduled for lung resection for probable malignancy. A PET scan and head CT are pending. When I last saw her we ordered a Myoview which was performed in March of 2013. This showed an ejection fraction of 49%. There was anterior and inferior ischemia. Since then, she has not had dyspnea. Her edema is unchanged. Occasional chest pain for one to 2 seconds. Occasional dizziness but no syncope.  Current Outpatient Prescriptions  Medication Sig Dispense Refill  . aspirin EC 81 MG tablet Take 81 mg by mouth daily.      . CYANOCOBALAMIN PO Take 1,000 mcg by mouth daily.       . furosemide (LASIX) 40 MG tablet Take 40 mg by mouth daily.      . glipiZIDE (GLUCOTROL) 5 MG tablet Take 10 mg by mouth 2 (two) times daily before a meal.       . hydrocortisone valerate ointment (WEST-CORT) 0.2 % Apply 1 application topically daily. Apply only to Lower extremities at affected areas.  Wash hands well after application.      . lisinopril (PRINIVIL,ZESTRIL) 2.5 MG tablet Take 2.5 mg by mouth daily.      . metFORMIN (GLUCOPHAGE) 1000 MG tablet Take 1,000 mg by mouth 2 (two) times daily.      . metoprolol succinate (TOPROL-XL) 25 MG 24 hr tablet Take 25 mg by mouth daily.         Past Medical History  Diagnosis Date  . Diabetes mellitus   . High cholesterol   . Diabetic retinopathy   . Peripheral neuropathy     "tips  of toes"  . Blind right eye   . Nephrotic syndrome   . Lung mass   . Cardiomyopathy     Past Surgical History  Procedure Date  . Cesarean section 1991; 1994  . Breast surgery   . Eye surgery   . Breast cyst incision and drainage ?1993    left  . Tubal ligation 1994  . Vitrectomy     bilaterally "right eye once; left eye twice"    History   Social History  . Marital Status: Married    Spouse Name: N/A    Number of Children: 3  . Years of Education: N/A   Occupational History  . Not on file.   Social History Main Topics  . Smoking status: Current Everyday Smoker -- 0.5 packs/day for 25 years    Types: Cigarettes  . Smokeless tobacco: Never Used  . Alcohol Use: No  . Drug Use: Yes    Special: Marijuana     "last time for marijuana ~ 1993"  . Sexually Active: Yes   Other Topics Concern  . Not on file   Social History Narrative  . No narrative on file    ROS: no fevers or chills, productive cough, hemoptysis, dysphasia, odynophagia, melena, hematochezia, dysuria, hematuria, rash, seizure activity, orthopnea, PND,  claudication. Remaining systems are negative.  Physical Exam: Well-developed well-nourished   in no acute distress.  Skin is warm and dry.  HEENT is normal.  Neck is supple. Left carotid bruit Chest is clear to auscultation with normal expansion.  Cardiovascular exam is regular rate and rhythm.  Abdominal exam nontender or distended. No masses palpated. Extremities show 1+ edema. neuro grossly intact      

## 2011-07-15 NOTE — Assessment & Plan Note (Addendum)
Plan continue ACE inhibitor and beta blocker. Myoview suggests anterior and inferior ischemia. Multiple risk factors including 20 years of diabetes mellitus and tobacco use. Patient scheduled for lung resection next week. I think we need to proceed with cardiac catheterization prior to her procedure. The risks and benefits were discussed and the patient agrees to proceed. Hold Lasix prior to procedure. No ventriculogram. Plan repeat echocardiogram in 3 months.

## 2011-07-15 NOTE — Patient Instructions (Signed)
Your physician recommends that you schedule a follow-up appointment in: 6 WEEKS  Your physician has requested that you have a cardiac catheterization. Cardiac catheterization is used to diagnose and/or treat various heart conditions. Doctors may recommend this procedure for a number of different reasons. The most common reason is to evaluate chest pain. Chest pain can be a symptom of coronary artery disease (CAD), and cardiac catheterization can show whether plaque is narrowing or blocking your heart's arteries. This procedure is also used to evaluate the valves, as well as measure the blood flow and oxygen levels in different parts of your heart. For further information please visit [email or web address] Please follow instruction sheet, as given.   Your physician recommends that you return for lab work in: TODAY AND IN 6 WEEKS  Your physician has requested that you have a carotid duplex. This test is an ultrasound of the carotid arteries in your neck. It looks at blood flow through these arteries that supply the brain with blood. Allow one hour for this exam. There are no restrictions or special instructions.   START PRAVASTATIN 40 MG ONCE DAILY AT BEDTIME

## 2011-07-15 NOTE — Assessment & Plan Note (Signed)
Management per nephrology. 

## 2011-07-15 NOTE — Assessment & Plan Note (Signed)
Patient counseled on discontinuing. 

## 2011-07-16 ENCOUNTER — Encounter (HOSPITAL_BASED_OUTPATIENT_CLINIC_OR_DEPARTMENT_OTHER)
Admission: RE | Disposition: A | Discharge status: Home / Self Care | Payer: Self-pay | Source: Ambulatory Visit | Attending: Cardiovascular Disease

## 2011-07-16 ENCOUNTER — Other Ambulatory Visit (HOSPITAL_COMMUNITY): Payer: Medicare Other

## 2011-07-16 ENCOUNTER — Inpatient Hospital Stay (HOSPITAL_BASED_OUTPATIENT_CLINIC_OR_DEPARTMENT_OTHER)
Admission: RE | Admit: 2011-07-16 | Discharge: 2011-07-16 | Disposition: A | Discharge status: Home / Self Care | Payer: Medicare Other | Source: Ambulatory Visit | Attending: Cardiovascular Disease | Admitting: Cardiovascular Disease

## 2011-07-16 DIAGNOSIS — I2510 Atherosclerotic heart disease of native coronary artery without angina pectoris: Secondary | ICD-10-CM | POA: Insufficient documentation

## 2011-07-16 DIAGNOSIS — R9430 Abnormal result of cardiovascular function study, unspecified: Secondary | ICD-10-CM

## 2011-07-16 DIAGNOSIS — H5460 Unqualified visual loss, one eye, unspecified: Secondary | ICD-10-CM | POA: Insufficient documentation

## 2011-07-16 DIAGNOSIS — E1149 Type 2 diabetes mellitus with other diabetic neurological complication: Secondary | ICD-10-CM | POA: Insufficient documentation

## 2011-07-16 DIAGNOSIS — I428 Other cardiomyopathies: Secondary | ICD-10-CM | POA: Insufficient documentation

## 2011-07-16 DIAGNOSIS — E7800 Pure hypercholesterolemia, unspecified: Secondary | ICD-10-CM | POA: Insufficient documentation

## 2011-07-16 DIAGNOSIS — E1142 Type 2 diabetes mellitus with diabetic polyneuropathy: Secondary | ICD-10-CM | POA: Insufficient documentation

## 2011-07-16 DIAGNOSIS — N049 Nephrotic syndrome with unspecified morphologic changes: Secondary | ICD-10-CM | POA: Insufficient documentation

## 2011-07-16 DIAGNOSIS — R222 Localized swelling, mass and lump, trunk: Secondary | ICD-10-CM | POA: Insufficient documentation

## 2011-07-16 HISTORY — PX: CARDIAC CATHETERIZATION: SHX172

## 2011-07-16 LAB — POCT I-STAT GLUCOSE
Glucose, Bld: 196 mg/dL — ABNORMAL HIGH (ref 70–99)
Operator id: 221371

## 2011-07-16 SURGERY — JV LEFT HEART CATHETERIZATION WITH CORONARY ANGIOGRAM
Anesthesia: Moderate Sedation

## 2011-07-16 MED ORDER — SODIUM CHLORIDE 0.9 % IJ SOLN: 3.0000 mL | INTRAMUSCULAR | Status: DC | PRN

## 2011-07-16 MED ORDER — ISOSORBIDE MONONITRATE ER 30 MG PO TB24: 30.0000 mg | ORAL_TABLET | Freq: Every day | ORAL | Status: DC

## 2011-07-16 MED ORDER — ASPIRIN 81 MG PO CHEW
324.0000 mg | CHEWABLE_TABLET | ORAL | Status: AC
  Administered 2011-07-16: 243 mg via ORAL

## 2011-07-16 MED ORDER — SODIUM CHLORIDE 0.9 % IJ SOLN
3.0000 mL | Freq: Two times a day (BID) | INTRAMUSCULAR | Status: DC
Start: 2011-07-16 — End: 2011-07-16

## 2011-07-16 MED ORDER — METOPROLOL SUCCINATE ER 25 MG PO TB24: 50.0000 mg | ORAL_TABLET | Freq: Every day | ORAL | Status: DC

## 2011-07-16 MED ORDER — SODIUM CHLORIDE 0.9 % IV SOLN: 250.0000 mL | INTRAVENOUS | Status: DC | PRN

## 2011-07-16 MED ORDER — DIAZEPAM 2 MG PO TABS
2.0000 mg | ORAL_TABLET | ORAL | Status: AC
  Administered 2011-07-16: 5 mg via ORAL

## 2011-07-16 MED ORDER — SODIUM CHLORIDE 0.9 % IV SOLN: INTRAVENOUS | Status: DC

## 2011-07-16 NOTE — OR Nursing (Signed)
Meal served 

## 2011-07-16 NOTE — H&P (View-Only) (Signed)
HPI:41 year old female for followup of cardiomyopathy and abnormal myoview. Recently admitted to [facility] with volume excess. Echocardiogram in February of 2013 showed an ejection fraction of 35-40% but difficult to assess because of tachycardia. There was mild mitral regurgitation. Patient was diagnosed with nephrotic syndrome. She was diuresed with significant improvement and the plan was to repeat echocardiogram on medications as an outpatient. Also to fu with nephrology. CTA showed no pulmonary embolus but there was a left lower lobe mass. Patient has been seen by Dr. Donata Clay and is scheduled for lung resection for probable malignancy. A PET scan and head CT are pending. When I last saw her we ordered a Myoview which was performed in March of 2013. This showed an ejection fraction of 49%. There was anterior and inferior ischemia. Since then, she has not had dyspnea. Her edema is unchanged. Occasional chest pain for one to 2 seconds. Occasional dizziness but no syncope.  Current Outpatient Prescriptions  Medication Sig Dispense Refill  . aspirin EC 81 MG tablet Take 81 mg by mouth daily.      . CYANOCOBALAMIN PO Take 1,000 mcg by mouth daily.       . furosemide (LASIX) 40 MG tablet Take 40 mg by mouth daily.      Marland Kitchen glipiZIDE (GLUCOTROL) 5 MG tablet Take 10 mg by mouth 2 (two) times daily before a meal.       . hydrocortisone valerate ointment (WEST-CORT) 0.2 % Apply 1 application topically daily. Apply only to Lower extremities at affected areas.  Wash hands well after application.      Marland Kitchen lisinopril (PRINIVIL,ZESTRIL) 2.5 MG tablet Take 2.5 mg by mouth daily.      . metFORMIN (GLUCOPHAGE) 1000 MG tablet Take 1,000 mg by mouth 2 (two) times daily.      . metoprolol succinate (TOPROL-XL) 25 MG 24 hr tablet Take 25 mg by mouth daily.         Past Medical History  Diagnosis Date  . Diabetes mellitus   . High cholesterol   . Diabetic retinopathy   . Peripheral neuropathy     "tips  of toes"  . Blind right eye   . Nephrotic syndrome   . Lung mass   . Cardiomyopathy     Past Surgical History  Procedure Date  . Cesarean section 1991; 1994  . Breast surgery   . Eye surgery   . Breast cyst incision and drainage ?1993    left  . Tubal ligation 1994  . Vitrectomy     bilaterally "right eye once; left eye twice"    History   Social History  . Marital Status: Married    Spouse Name: N/A    Number of Children: 3  . Years of Education: N/A   Occupational History  . Not on file.   Social History Main Topics  . Smoking status: Current Everyday Smoker -- 0.5 packs/day for 25 years    Types: Cigarettes  . Smokeless tobacco: Never Used  . Alcohol Use: No  . Drug Use: Yes    Special: Marijuana     "last time for marijuana ~ 1993"  . Sexually Active: Yes   Other Topics Concern  . Not on file   Social History Narrative  . No narrative on file    ROS: no fevers or chills, productive cough, hemoptysis, dysphasia, odynophagia, melena, hematochezia, dysuria, hematuria, rash, seizure activity, orthopnea, PND,  claudication. Remaining systems are negative.  Physical Exam: Well-developed well-nourished  in no acute distress.  Skin is warm and dry.  HEENT is normal.  Neck is supple. Left carotid bruit Chest is clear to auscultation with normal expansion.  Cardiovascular exam is regular rate and rhythm.  Abdominal exam nontender or distended. No masses palpated. Extremities show 1+ edema. neuro grossly intact

## 2011-07-16 NOTE — Interval H&P Note (Signed)
History and Physical Interval Note:  07/16/2011 9:22 AM  Hannah [name]  has presented today for surgery, with the diagnosis of cp  The various methods of treatment have been discussed with the patient and family. After consideration of risks, benefits and other options for treatment, the patient has consented to  Procedure(s) (LRB): JV LEFT HEART CATHETERIZATION WITH CORONARY ANGIOGRAM (N/A) as a surgical intervention .  The patients' history has been reviewed, patient examined, no change in status, stable for surgery.  I have reviewed the patients' chart and labs.  Questions were answered to the patient's satisfaction.     Charlton Haws

## 2011-07-16 NOTE — OR Nursing (Signed)
Discharge instructions reviewed and signed, pt stated understanding, ambulated in hall without difficulty, site level 0, transported to husband's car via wheelchair 

## 2011-07-16 NOTE — OR Nursing (Signed)
Tegaderm dressing applied, site level 0, bedrest begins at 1020 

## 2011-07-16 NOTE — CV Procedure (Signed)
      Catheterization   Indication: Preop Clearence Thoracotomy.  Diabetic with positive myovue  Procedure: After informed consent and clinical "time out" the right groin was prepped and draped in a sterile fashion.  A 5Fr sheath was placed in the right femoral artery using seldinger technique and local lidocaine.  Standard JL4, JR4 and angled pigtail catheters were used to engage the coronary arteries.  Coronary arteries were visualized in orthogonal views using caudal and cranial angulation.  RAO ventriculography was done using 25* cc of contrast.    Medications:   Versed: 2 mg's  Fentanyl: 0 ug's  Coronary Arteries: Right dominant with no anomalies  LM: Short segment calcified normal  LAD: Calcified.  30% proximal, 50% multiple discrete mid vessel lesions, 40% multiple discrete lesions distally    D1: 80% small diffusely diseased vessel   Circumflex: Calcified.  50-60% multiple mid vessel liesions   OM1: 80% proximal small diffusely diseased vessel   RCA: 100% near ostial proximal occlusion.  Excellent chronic well formed collaterals from left system    Ventriculography: EF: 55 %, inferior wall moves well  Hemodynamics:  Aortic Pressure: 135 83  mmHg  LV Pressure: 130 15  mmHg  Impression:  Patient has significant CAD.  In regard to upcoming thoracotomy I think medical Rx is warranted.  RCA occlusion is chronic with well formed collaterals.  Diagonal and OM are too small to stent Increase Toprol to 50 mg and add nitrates.  Surgery has not been scheduled.  Needs CT and PET scan.  Hold glucophage for 48 hrs.  She did not hold it prior to procedure.  F/U with Dr Jens Som in 2 weeks and check BMET in a week  Charlton Haws 07/16/2011 9:23 AM

## 2011-07-16 NOTE — OR Nursing (Signed)
Dr Nishan at bedside to discuss results and treatment plan with pt and family 

## 2011-07-16 NOTE — Discharge Instructions (Signed)
Check blood work Tuesday BMET ( kidney function) Dont take glucophage for 48 hrs Increase Toprol to 50 mg Pick up new medication Imdur  F/U Dr Jens Som in 2 weeks

## 2011-07-16 NOTE — Op Note (Signed)
See CV procedure note  Hannah [name]  

## 2011-07-18 NOTE — Progress Notes (Signed)
Her surgery will be rescheduled for next week

## 2011-07-19 ENCOUNTER — Other Ambulatory Visit: Payer: Medicare Other

## 2011-07-19 ENCOUNTER — Telehealth: Payer: Self-pay | Admitting: Cardiology

## 2011-07-19 ENCOUNTER — Telehealth: Payer: Self-pay | Admitting: Family Medicine

## 2011-07-19 ENCOUNTER — Inpatient Hospital Stay (HOSPITAL_COMMUNITY): Admission: RE | Admit: 2011-07-19 | Discharge: 2011-07-19 | Payer: Medicare Other | Source: Ambulatory Visit

## 2011-07-19 DIAGNOSIS — Z85118 Personal history of other malignant neoplasm of bronchus and lung: Secondary | ICD-10-CM

## 2011-07-19 HISTORY — DX: Personal history of other malignant neoplasm of bronchus and lung: Z85.118

## 2011-07-19 MED ORDER — LISINOPRIL 2.5 MG PO TABS: 2.5000 mg | ORAL_TABLET | Freq: Every day | ORAL | Status: DC

## 2011-07-19 NOTE — Pre-Procedure Instructions (Signed)
20 Hannah [name]  07/19/2011   Your procedure is scheduled on:  07/20/11  Report to [facility] at 730 AM.  Call this number if you have problems the morning of surgery: [phone]   Remember:   Do not eat food:After Midnight.  May have clear liquids: up to 4 Hours before arrival.  Clear liquids include soda, tea, black coffee, apple or grape juice, broth.  Take these medicines the morning of surgery with A SIP OF WATER: **imdur,toprol   Do not wear jewelry, make-up or nail polish.  Do not wear lotions, powders, or perfumes. You may wear deodorant.  Do not shave 48 hours prior to surgery.  Do not bring valuables to the hospital.  Contacts, dentures or bridgework may not be worn into surgery.  Leave suitcase in the car. After surgery it may be brought to your room.  For patients admitted to the hospital, checkout time is 11:00 AM the day of discharge.   Patients discharged the day of surgery will not be allowed to drive home.  Name and phone number of your driver: family  Special Instructions: CHG Shower Use Special Wash: 1/2 bottle night before surgery and 1/2 bottle morning of surgery.   Please read over the following fact sheets that you were given: Pain Booklet, Coughing and Deep Breathing, Blood Transfusion Information, MRSA Information and Surgical Site Infection Prevention

## 2011-07-19 NOTE — Telephone Encounter (Signed)
New problem:  Per pateint calling had heart cath done on Friday. Calling to see does she need an follow up appt or keep her appt she has in May,.

## 2011-07-19 NOTE — Telephone Encounter (Signed)
Spoke with pt, post hosp follow up scheduled with scott  Weaver. Lab order mailed to pt for labs to be drawn in [place]

## 2011-07-20 ENCOUNTER — Encounter (HOSPITAL_COMMUNITY): Admission: RE | Payer: Self-pay | Source: Ambulatory Visit

## 2011-07-20 SURGERY — VIDEO ASSISTED THORACOSCOPY (VATS)/ LOBECTOMY
Anesthesia: General | Site: Chest | Laterality: Left

## 2011-07-21 ENCOUNTER — Ambulatory Visit (HOSPITAL_COMMUNITY): Admission: RE | Admit: 2011-07-21 | Payer: Medicare Other | Source: Ambulatory Visit | Admitting: Cardiothoracic Surgery

## 2011-07-26 ENCOUNTER — Other Ambulatory Visit: Payer: Self-pay | Admitting: Cardiology

## 2011-07-26 LAB — BASIC METABOLIC PANEL
BUN: 33 mg/dL — ABNORMAL HIGH (ref 6–23)
Chloride: 103 mEq/L (ref 96–112)
Potassium: 5 mEq/L (ref 3.5–5.3)
Sodium: 132 mEq/L — ABNORMAL LOW (ref 135–145)

## 2011-07-27 ENCOUNTER — Other Ambulatory Visit: Payer: Self-pay | Admitting: Emergency Medicine

## 2011-07-27 DIAGNOSIS — R918 Other nonspecific abnormal finding of lung field: Secondary | ICD-10-CM

## 2011-07-28 ENCOUNTER — Encounter (HOSPITAL_COMMUNITY): Payer: Self-pay | Admitting: Pharmacy Technician

## 2011-07-28 ENCOUNTER — Other Ambulatory Visit: Payer: Self-pay | Admitting: Cardiothoracic Surgery

## 2011-07-28 ENCOUNTER — Encounter (HOSPITAL_COMMUNITY): Payer: Self-pay

## 2011-07-28 ENCOUNTER — Other Ambulatory Visit: Payer: Self-pay | Admitting: Emergency Medicine

## 2011-07-28 ENCOUNTER — Encounter (HOSPITAL_COMMUNITY)
Admission: RE | Admit: 2011-07-28 | Discharge: 2011-07-28 | Disposition: A | Discharge status: Home / Self Care | Payer: Medicare Other | Source: Ambulatory Visit | Attending: Emergency Medicine | Admitting: Emergency Medicine

## 2011-07-28 ENCOUNTER — Encounter (HOSPITAL_COMMUNITY)
Admission: RE | Admit: 2011-07-28 | Discharge: 2011-07-28 | Disposition: A | Discharge status: Home / Self Care | Payer: Medicare Other | Source: Ambulatory Visit | Attending: Cardiothoracic Surgery | Admitting: Cardiothoracic Surgery

## 2011-07-28 DIAGNOSIS — I6529 Occlusion and stenosis of unspecified carotid artery: Secondary | ICD-10-CM

## 2011-07-28 DIAGNOSIS — R918 Other nonspecific abnormal finding of lung field: Secondary | ICD-10-CM

## 2011-07-28 DIAGNOSIS — R222 Localized swelling, mass and lump, trunk: Secondary | ICD-10-CM | POA: Insufficient documentation

## 2011-07-28 DIAGNOSIS — C3430 Malignant neoplasm of lower lobe, unspecified bronchus or lung: Secondary | ICD-10-CM | POA: Insufficient documentation

## 2011-07-28 DIAGNOSIS — I2510 Atherosclerotic heart disease of native coronary artery without angina pectoris: Secondary | ICD-10-CM | POA: Insufficient documentation

## 2011-07-28 LAB — GLUCOSE, CAPILLARY: Glucose-Capillary: 206 mg/dL — ABNORMAL HIGH (ref 70–99)

## 2011-07-28 MED ORDER — IOHEXOL 300 MG/ML  SOLN
100.0000 mL | Freq: Once | INTRAMUSCULAR | Status: AC | PRN
  Administered 2011-07-28: 100 mL via INTRAVENOUS

## 2011-07-28 MED ORDER — FLUDEOXYGLUCOSE F - 18 (FDG) INJECTION
15.0000 | Freq: Once | INTRAVENOUS | Status: AC | PRN
  Administered 2011-07-28: 15 via INTRAVENOUS

## 2011-07-29 ENCOUNTER — Encounter (HOSPITAL_COMMUNITY): Payer: Self-pay

## 2011-07-29 ENCOUNTER — Encounter (HOSPITAL_COMMUNITY)
Admission: RE | Admit: 2011-07-29 | Discharge: 2011-07-29 | Disposition: A | Discharge status: Home / Self Care | Payer: Medicare Other | Source: Ambulatory Visit | Attending: Cardiothoracic Surgery | Admitting: Cardiothoracic Surgery

## 2011-07-29 ENCOUNTER — Ambulatory Visit (HOSPITAL_COMMUNITY)
Admission: RE | Admit: 2011-07-29 | Discharge: 2011-07-29 | Disposition: A | Discharge status: Home / Self Care | Payer: Medicare Other | Source: Ambulatory Visit | Attending: Cardiothoracic Surgery | Admitting: Cardiothoracic Surgery

## 2011-07-29 ENCOUNTER — Encounter (HOSPITAL_COMMUNITY)
Admission: RE | Admit: 2011-07-29 | Discharge: 2011-07-29 | Disposition: A | Discharge status: Home / Self Care | Payer: Medicare Other | Source: Ambulatory Visit

## 2011-07-29 ENCOUNTER — Other Ambulatory Visit: Payer: Self-pay

## 2011-07-29 VITALS — BP 100/67 | HR 83 | Temp 97.5°F | Resp 20 | Ht 67.0 in | Wt 213.6 lb

## 2011-07-29 DIAGNOSIS — D381 Neoplasm of uncertain behavior of trachea, bronchus and lung: Secondary | ICD-10-CM

## 2011-07-29 DIAGNOSIS — R0989 Other specified symptoms and signs involving the circulatory and respiratory systems: Secondary | ICD-10-CM

## 2011-07-29 DIAGNOSIS — I6529 Occlusion and stenosis of unspecified carotid artery: Secondary | ICD-10-CM

## 2011-07-29 HISTORY — DX: Heart failure, unspecified: I50.9

## 2011-07-29 HISTORY — DX: Essential (primary) hypertension: I10

## 2011-07-29 LAB — URINALYSIS, ROUTINE W REFLEX MICROSCOPIC
Bilirubin Urine: NEGATIVE
Glucose, UA: >1000 mg/dL — AB
Ketones, ur: NEGATIVE mg/dL
Leukocytes, UA: NEGATIVE
Nitrite: NEGATIVE
Protein, ur: >300 mg/dL — AB
Specific Gravity, Urine: 1.023 (ref 1.005–1.030)
Urobilinogen, UA: 0.2 mg/dL (ref 0.0–1.0)
pH: 6 (ref 5.0–8.0)

## 2011-07-29 LAB — BLOOD GAS, ARTERIAL
Acid-Base Excess: 0.1 mmol/L (ref 0.0–2.0)
Bicarbonate: 24.1 mEq/L — ABNORMAL HIGH (ref 20.0–24.0)
Drawn by: 344381
FIO2: 0.21 %
O2 Saturation: 97.6 %
Patient temperature: 98.6
TCO2: 25.2 mmol/L (ref 0–100)
pCO2 arterial: 37.8 mmHg (ref 35.0–45.0)
pH, Arterial: 7.42 — ABNORMAL HIGH (ref 7.350–7.400)
pO2, Arterial: 87.6 mmHg (ref 80.0–100.0)

## 2011-07-29 LAB — CBC
HCT: 34.4 % — ABNORMAL LOW (ref 36.0–46.0)
Hemoglobin: 11.7 g/dL — ABNORMAL LOW (ref 12.0–15.0)
MCH: 29.8 pg (ref 26.0–34.0)
MCHC: 34 g/dL (ref 30.0–36.0)
MCV: 87.5 fL (ref 78.0–100.0)
Platelets: 315 10*3/uL (ref 150–400)
RBC: 3.93 MIL/uL (ref 3.87–5.11)
RDW: 14 % (ref 11.5–15.5)
WBC: 9.3 10*3/uL (ref 4.0–10.5)

## 2011-07-29 LAB — COMPREHENSIVE METABOLIC PANEL
ALT: 13 U/L (ref 0–35)
AST: 14 U/L (ref 0–37)
Albumin: 1.4 g/dL — ABNORMAL LOW (ref 3.5–5.2)
Alkaline Phosphatase: 59 U/L (ref 39–117)
BUN: 23 mg/dL (ref 6–23)
CO2: 21 mEq/L (ref 19–32)
Calcium: 7.7 mg/dL — ABNORMAL LOW (ref 8.4–10.5)
Chloride: 103 mEq/L (ref 96–112)
Creatinine, Ser: 1.08 mg/dL (ref 0.50–1.10)
GFR calc Af Amer: 72 mL/min — ABNORMAL LOW (ref >90)
GFR calc non Af Amer: 62 mL/min — ABNORMAL LOW (ref >90)
Glucose, Bld: 235 mg/dL — ABNORMAL HIGH (ref 70–99)
Potassium: 4.7 mEq/L (ref 3.5–5.1)
Sodium: 131 mEq/L — ABNORMAL LOW (ref 135–145)
Total Bilirubin: 0.1 mg/dL — ABNORMAL LOW (ref 0.3–1.2)
Total Protein: 4 g/dL — ABNORMAL LOW (ref 6.0–8.3)

## 2011-07-29 LAB — APTT: aPTT: 30 seconds (ref 24–37)

## 2011-07-29 LAB — URINE MICROSCOPIC-ADD ON

## 2011-07-29 LAB — ABO/RH: ABO/RH(D): O POS

## 2011-07-29 LAB — PROTIME-INR
INR: 1.01 (ref 0.00–1.49)
Prothrombin Time: 13.5 seconds (ref 11.6–15.2)

## 2011-07-29 LAB — TYPE AND SCREEN
ABO/RH(D): O POS
Antibody Screen: NEGATIVE

## 2011-07-29 LAB — SURGICAL PCR SCREEN
MRSA, PCR: NEGATIVE
Staphylococcus aureus: NEGATIVE

## 2011-07-29 MED ORDER — DEXTROSE 5 % IV SOLN
1.5000 g | INTRAVENOUS | Status: AC
  Administered 2011-07-30: 1.5 g via INTRAVENOUS
  Filled 2011-07-29: qty 1.5

## 2011-07-29 NOTE — Progress Notes (Signed)
*  PRELIMINARY RESULTS*  Vascular Ultrasound Carotid Duplex (Doppler) has been completed. Right internal carotid artery demonstrates 60-79% stenosis. Left internal carotid artery demonstrates >80% stenosis. Bilateral antegrade vertebral artery flow.  Malachy Moan, RDMS, RDCS 07/29/2011, 2:44 PM

## 2011-07-29 NOTE — Pre-Procedure Instructions (Signed)
20 Hannah [name]  07/29/2011   Your procedure is scheduled on:  07/30/11  Report to [facility] at *830AM.  Call this number if you have problems the morning of surgery: [phone]   Remember:   Do not eat food:After Midnight.  May have clear liquids: up to 4 Hours before arrival.  Clear liquids include soda, tea, black coffee, apple or grape juice, broth.  Take these medicines the morning of surgery with A SIP OF WATER: **toprol, isorbide   Do not wear jewelry, make-up or nail polish.  Do not wear lotions, powders, or perfumes. You may wear deodorant.  Do not shave 48 hours prior to surgery.  Do not bring valuables to the hospital.  Contacts, dentures or bridgework may not be worn into surgery.  Leave suitcase in the car. After surgery it may be brought to your room.  For patients admitted to the hospital, checkout time is 11:00 AM the day of discharge.   Patients discharged the day of surgery will not be allowed to drive home.  Name and phone number of your driver: family  Special Instructions: CHG Shower Use Special Wash: 1/2 bottle night before surgery and 1/2 bottle morning of surgery.   Please read over the following fact sheets that you were given: Pain Booklet, Coughing and Deep Breathing, Blood Transfusion Information, Total Joint Packet, MRSA Information and Surgical Site Infection Prevention

## 2011-07-30 ENCOUNTER — Encounter (HOSPITAL_COMMUNITY): Payer: Self-pay | Admitting: *Deleted

## 2011-07-30 ENCOUNTER — Ambulatory Visit (HOSPITAL_COMMUNITY): Payer: Medicare Other | Admitting: *Deleted

## 2011-07-30 ENCOUNTER — Inpatient Hospital Stay (HOSPITAL_COMMUNITY)
Admission: RE | Admit: 2011-07-30 | Discharge: 2011-08-04 | DRG: 164 | Disposition: A | Discharge status: Home / Self Care | Payer: Medicare Other | Source: Ambulatory Visit | Attending: Cardiothoracic Surgery | Admitting: Cardiothoracic Surgery

## 2011-07-30 ENCOUNTER — Encounter (HOSPITAL_COMMUNITY)
Admission: RE | Disposition: A | Discharge status: Home / Self Care | Payer: Self-pay | Source: Ambulatory Visit | Attending: Cardiothoracic Surgery

## 2011-07-30 ENCOUNTER — Ambulatory Visit (HOSPITAL_COMMUNITY): Payer: Medicare Other

## 2011-07-30 DIAGNOSIS — Z01810 Encounter for preprocedural cardiovascular examination: Secondary | ICD-10-CM

## 2011-07-30 DIAGNOSIS — K219 Gastro-esophageal reflux disease without esophagitis: Secondary | ICD-10-CM | POA: Diagnosis present

## 2011-07-30 DIAGNOSIS — I10 Essential (primary) hypertension: Secondary | ICD-10-CM | POA: Diagnosis present

## 2011-07-30 DIAGNOSIS — I509 Heart failure, unspecified: Secondary | ICD-10-CM | POA: Diagnosis present

## 2011-07-30 DIAGNOSIS — I2510 Atherosclerotic heart disease of native coronary artery without angina pectoris: Secondary | ICD-10-CM | POA: Diagnosis present

## 2011-07-30 DIAGNOSIS — E11319 Type 2 diabetes mellitus with unspecified diabetic retinopathy without macular edema: Secondary | ICD-10-CM | POA: Diagnosis present

## 2011-07-30 DIAGNOSIS — C3430 Malignant neoplasm of lower lobe, unspecified bronchus or lung: Principal | ICD-10-CM | POA: Diagnosis present

## 2011-07-30 DIAGNOSIS — D381 Neoplasm of uncertain behavior of trachea, bronchus and lung: Secondary | ICD-10-CM

## 2011-07-30 DIAGNOSIS — Z01812 Encounter for preprocedural laboratory examination: Secondary | ICD-10-CM

## 2011-07-30 DIAGNOSIS — I658 Occlusion and stenosis of other precerebral arteries: Secondary | ICD-10-CM | POA: Diagnosis present

## 2011-07-30 DIAGNOSIS — Z01818 Encounter for other preprocedural examination: Secondary | ICD-10-CM

## 2011-07-30 DIAGNOSIS — D62 Acute posthemorrhagic anemia: Secondary | ICD-10-CM | POA: Diagnosis not present

## 2011-07-30 DIAGNOSIS — H5440 Blindness, one eye, unspecified eye: Secondary | ICD-10-CM | POA: Diagnosis present

## 2011-07-30 DIAGNOSIS — Z79899 Other long term (current) drug therapy: Secondary | ICD-10-CM

## 2011-07-30 DIAGNOSIS — E1139 Type 2 diabetes mellitus with other diabetic ophthalmic complication: Secondary | ICD-10-CM | POA: Diagnosis present

## 2011-07-30 DIAGNOSIS — K5900 Constipation, unspecified: Secondary | ICD-10-CM | POA: Diagnosis not present

## 2011-07-30 DIAGNOSIS — I428 Other cardiomyopathies: Secondary | ICD-10-CM | POA: Diagnosis present

## 2011-07-30 DIAGNOSIS — Z7982 Long term (current) use of aspirin: Secondary | ICD-10-CM

## 2011-07-30 DIAGNOSIS — G609 Hereditary and idiopathic neuropathy, unspecified: Secondary | ICD-10-CM | POA: Diagnosis present

## 2011-07-30 DIAGNOSIS — E785 Hyperlipidemia, unspecified: Secondary | ICD-10-CM | POA: Diagnosis present

## 2011-07-30 DIAGNOSIS — I6529 Occlusion and stenosis of unspecified carotid artery: Secondary | ICD-10-CM | POA: Diagnosis present

## 2011-07-30 DIAGNOSIS — F17200 Nicotine dependence, unspecified, uncomplicated: Secondary | ICD-10-CM | POA: Diagnosis present

## 2011-07-30 HISTORY — PX: VIDEO ASSISTED THORACOSCOPY (VATS)/ LOBECTOMY: SHX6169

## 2011-07-30 LAB — BLOOD GAS, ARTERIAL
Acid-base deficit: 2.4 mmol/L — ABNORMAL HIGH (ref 0.0–2.0)
TCO2: 23.8 mmol/L (ref 0–100)
pCO2 arterial: 42.9 mmHg (ref 35.0–45.0)
pH, Arterial: 7.339 — ABNORMAL LOW (ref 7.350–7.400)
pO2, Arterial: 177 mmHg — ABNORMAL HIGH (ref 80.0–100.0)

## 2011-07-30 LAB — HCG, SERUM, QUALITATIVE: Preg, Serum: NEGATIVE

## 2011-07-30 LAB — GLUCOSE, CAPILLARY
Glucose-Capillary: 62 mg/dL — ABNORMAL LOW (ref 70–99)
Glucose-Capillary: 94 mg/dL (ref 70–99)
Glucose-Capillary: 160 mg/dL — ABNORMAL HIGH (ref 70–99)

## 2011-07-30 SURGERY — VIDEO ASSISTED THORACOSCOPY (VATS)/ LOBECTOMY
Anesthesia: General | Site: Chest | Laterality: Left | Wound class: Clean Contaminated

## 2011-07-30 MED ORDER — HYDROMORPHONE HCL PF 1 MG/ML IJ SOLN
0.2500 mg | INTRAMUSCULAR | Status: DC | PRN
  Administered 2011-07-30: 0.5 mg via INTRAVENOUS

## 2011-07-30 MED ORDER — INSULIN ASPART 100 UNIT/ML ~~LOC~~ SOLN: 0.0000 [IU] | Freq: Three times a day (TID) | SUBCUTANEOUS | Status: DC

## 2011-07-30 MED ORDER — DEXTROSE 50 % IV SOLN
INTRAVENOUS | Status: AC
  Administered 2011-07-30: 15 mL
  Filled 2011-07-30: qty 50

## 2011-07-30 MED ORDER — POTASSIUM CHLORIDE 10 MEQ/50ML IV SOLN: 10.0000 meq | Freq: Every day | INTRAVENOUS | Status: DC | PRN

## 2011-07-30 MED ORDER — BUPIVACAINE 0.5 % ON-Q PUMP SINGLE CATH 400 ML
INJECTION | Status: DC | PRN
  Administered 2011-07-30: 400 mL

## 2011-07-30 MED ORDER — BUPIVACAINE 0.5 % ON-Q PUMP SINGLE CATH 400 ML
400.0000 mL | INJECTION | Status: DC
  Filled 2011-07-30: qty 400

## 2011-07-30 MED ORDER — OXYCODONE HCL 5 MG PO TABS
5.0000 mg | ORAL_TABLET | ORAL | Status: AC | PRN
  Administered 2011-07-30: 5 mg via ORAL
  Administered 2011-07-31: 10 mg via ORAL
  Filled 2011-07-30: qty 1
  Filled 2011-07-30: qty 2
  Filled 2011-07-30: qty 1

## 2011-07-30 MED ORDER — DEXTROSE 5 % IV SOLN
INTRAVENOUS | Status: DC | PRN
  Administered 2011-07-30: 14:00:00 via INTRAVENOUS

## 2011-07-30 MED ORDER — MIDAZOLAM HCL 5 MG/5ML IJ SOLN
INTRAMUSCULAR | Status: DC | PRN
  Administered 2011-07-30 (×2): 1 mg via INTRAVENOUS

## 2011-07-30 MED ORDER — ONDANSETRON HCL 4 MG/2ML IJ SOLN: 4.0000 mg | Freq: Four times a day (QID) | INTRAMUSCULAR | Status: DC | PRN

## 2011-07-30 MED ORDER — OXYCODONE-ACETAMINOPHEN 5-325 MG PO TABS
1.0000 | ORAL_TABLET | ORAL | Status: DC | PRN
  Administered 2011-07-31 (×2): 1 via ORAL
  Administered 2011-08-01: 2 via ORAL
  Administered 2011-08-01: 1 via ORAL
  Administered 2011-08-01 – 2011-08-02 (×2): 2 via ORAL
  Administered 2011-08-02 (×2): 1 via ORAL
  Administered 2011-08-02: 2 via ORAL
  Administered 2011-08-02: 1 via ORAL
  Administered 2011-08-03 – 2011-08-04 (×6): 2 via ORAL
  Filled 2011-07-30 (×2): qty 2
  Filled 2011-07-30 (×2): qty 1
  Filled 2011-07-30 (×3): qty 2
  Filled 2011-07-30 (×2): qty 1
  Filled 2011-07-30 (×3): qty 2
  Filled 2011-07-30: qty 1
  Filled 2011-07-30 (×3): qty 2

## 2011-07-30 MED ORDER — NICOTINE 21 MG/24HR TD PT24
21.0000 mg | MEDICATED_PATCH | Freq: Every day | TRANSDERMAL | Status: DC
  Administered 2011-07-30 – 2011-08-04 (×6): 21 mg via TRANSDERMAL
  Filled 2011-07-30 (×6): qty 1

## 2011-07-30 MED ORDER — PROPOFOL 10 MG/ML IV EMUL
INTRAVENOUS | Status: DC | PRN
  Administered 2011-07-30: 90 mg via INTRAVENOUS

## 2011-07-30 MED ORDER — LEVALBUTEROL HCL 0.63 MG/3ML IN NEBU
0.6300 mg | INHALATION_SOLUTION | Freq: Four times a day (QID) | RESPIRATORY_TRACT | Status: DC
  Administered 2011-07-30 – 2011-08-03 (×10): 0.63 mg via RESPIRATORY_TRACT
  Filled 2011-07-30 (×20): qty 3

## 2011-07-30 MED ORDER — VITAMIN B-12 1000 MCG PO TABS
1000.0000 ug | ORAL_TABLET | Freq: Every day | ORAL | Status: DC
  Administered 2011-07-31 – 2011-08-04 (×5): 1000 ug via ORAL
  Filled 2011-07-30 (×5): qty 1

## 2011-07-30 MED ORDER — BUPIVACAINE ON-Q PAIN PUMP (FOR ORDER SET NO CHG)
INJECTION | Status: DC
  Filled 2011-07-30: qty 1

## 2011-07-30 MED ORDER — INSULIN ASPART 100 UNIT/ML ~~LOC~~ SOLN
0.0000 [IU] | SUBCUTANEOUS | Status: DC
  Administered 2011-07-30 (×2): 2 [IU] via SUBCUTANEOUS
  Administered 2011-07-31: 4 [IU] via SUBCUTANEOUS
  Administered 2011-07-31: 2 [IU] via SUBCUTANEOUS
  Administered 2011-08-01 (×2): 4 [IU] via SUBCUTANEOUS
  Administered 2011-08-01: 2 [IU] via SUBCUTANEOUS

## 2011-07-30 MED ORDER — FUROSEMIDE 40 MG PO TABS
40.0000 mg | ORAL_TABLET | Freq: Every day | ORAL | Status: DC
  Administered 2011-07-31 – 2011-08-04 (×5): 40 mg via ORAL
  Filled 2011-07-30 (×6): qty 1

## 2011-07-30 MED ORDER — SODIUM CHLORIDE 0.9 % IJ SOLN: 9.0000 mL | INTRAMUSCULAR | Status: DC | PRN

## 2011-07-30 MED ORDER — FENTANYL 10 MCG/ML IV SOLN
INTRAVENOUS | Status: DC
  Administered 2011-07-30: 300 ug via INTRAVENOUS
  Administered 2011-07-30: via INTRAVENOUS
  Administered 2011-07-30: 15 ug via INTRAVENOUS
  Administered 2011-07-31: 315 ug via INTRAVENOUS
  Administered 2011-07-31: 20:00:00 via INTRAVENOUS
  Administered 2011-07-31: 240 ug via INTRAVENOUS
  Administered 2011-07-31: 09:00:00 via INTRAVENOUS
  Administered 2011-08-01: 90 ug via INTRAVENOUS
  Administered 2011-08-01: 07:00:00 via INTRAVENOUS
  Administered 2011-08-01: 195 ug via INTRAVENOUS
  Administered 2011-08-01: 180 ug via INTRAVENOUS
  Administered 2011-08-02: 02:00:00 via INTRAVENOUS
  Administered 2011-08-02: 135 ug via INTRAVENOUS
  Administered 2011-08-02: 105 ug via INTRAVENOUS
  Administered 2011-08-02: 208.4 ug via INTRAVENOUS
  Administered 2011-08-02: 15:00:00 via INTRAVENOUS
  Administered 2011-08-03: 90 ug via INTRAVENOUS
  Administered 2011-08-03: 150 ug via INTRAVENOUS
  Administered 2011-08-03: 08:00:00 via INTRAVENOUS
  Administered 2011-08-03: 105 ug via INTRAVENOUS
  Administered 2011-08-04: 45 ug via INTRAVENOUS
  Administered 2011-08-04: 157.6 ug via INTRAVENOUS
  Administered 2011-08-04: 135 ug via INTRAVENOUS
  Filled 2011-07-30 (×12): qty 50

## 2011-07-30 MED ORDER — SODIUM CHLORIDE 0.9 % IV SOLN
INTRAVENOUS | Status: DC
  Filled 2011-07-30: qty 1

## 2011-07-30 MED ORDER — DEXTROSE 5 % IV SOLN
1.5000 g | Freq: Two times a day (BID) | INTRAVENOUS | Status: AC
  Administered 2011-07-30 – 2011-07-31 (×2): 1.5 g via INTRAVENOUS
  Filled 2011-07-30 (×3): qty 1.5

## 2011-07-30 MED ORDER — GLYCOPYRROLATE 0.2 MG/ML IJ SOLN
INTRAMUSCULAR | Status: DC | PRN
  Administered 2011-07-30: 0.6 mg via INTRAVENOUS

## 2011-07-30 MED ORDER — LACTATED RINGERS IV SOLN
INTRAVENOUS | Status: DC | PRN
  Administered 2011-07-30 (×2): via INTRAVENOUS

## 2011-07-30 MED ORDER — POTASSIUM CHLORIDE IN NACL 20-0.45 MEQ/L-% IV SOLN
INTRAVENOUS | Status: DC
  Administered 2011-07-30: 75 mL/h via INTRAVENOUS
  Filled 2011-07-30 (×3): qty 1000

## 2011-07-30 MED ORDER — FENTANYL CITRATE 0.05 MG/ML IJ SOLN
INTRAMUSCULAR | Status: DC | PRN
  Administered 2011-07-30: 50 ug via INTRAVENOUS
  Administered 2011-07-30: 300 ug via INTRAVENOUS
  Administered 2011-07-30: 100 ug via INTRAVENOUS
  Administered 2011-07-30 (×2): 50 ug via INTRAVENOUS
  Administered 2011-07-30: 100 ug via INTRAVENOUS

## 2011-07-30 MED ORDER — DIPHENHYDRAMINE HCL 12.5 MG/5ML PO ELIX
12.5000 mg | ORAL_SOLUTION | Freq: Four times a day (QID) | ORAL | Status: DC | PRN
  Filled 2011-07-30: qty 5

## 2011-07-30 MED ORDER — BISACODYL 5 MG PO TBEC
10.0000 mg | DELAYED_RELEASE_TABLET | Freq: Every day | ORAL | Status: DC
  Administered 2011-07-30 – 2011-08-04 (×6): 10 mg via ORAL
  Filled 2011-07-30 (×6): qty 2

## 2011-07-30 MED ORDER — NITROGLYCERIN IN D5W 200-5 MCG/ML-% IV SOLN
INTRAVENOUS | Status: DC | PRN
  Administered 2011-07-30: 10 ug/min via INTRAVENOUS

## 2011-07-30 MED ORDER — METOPROLOL SUCCINATE ER 25 MG PO TB24
25.0000 mg | ORAL_TABLET | Freq: Every day | ORAL | Status: DC
  Administered 2011-07-31 – 2011-08-04 (×5): 25 mg via ORAL
  Filled 2011-07-30 (×5): qty 1

## 2011-07-30 MED ORDER — NEOSTIGMINE METHYLSULFATE 1 MG/ML IJ SOLN
INTRAMUSCULAR | Status: DC | PRN
  Administered 2011-07-30: 4 mg via INTRAVENOUS

## 2011-07-30 MED ORDER — ONDANSETRON HCL 4 MG/2ML IJ SOLN
INTRAMUSCULAR | Status: DC | PRN
  Administered 2011-07-30: 4 mg via INTRAVENOUS

## 2011-07-30 MED ORDER — ROCURONIUM BROMIDE 100 MG/10ML IV SOLN
INTRAVENOUS | Status: DC | PRN
  Administered 2011-07-30: 20 mg via INTRAVENOUS
  Administered 2011-07-30: 30 mg via INTRAVENOUS
  Administered 2011-07-30: 50 mg via INTRAVENOUS

## 2011-07-30 MED ORDER — SODIUM CHLORIDE 0.9 % IV SOLN
100.0000 [IU] | INTRAVENOUS | Status: DC | PRN
  Administered 2011-07-30: 5.2 [IU]/h via INTRAVENOUS

## 2011-07-30 MED ORDER — KETOROLAC TROMETHAMINE 15 MG/ML IJ SOLN
15.0000 mg | Freq: Four times a day (QID) | INTRAMUSCULAR | Status: DC
  Administered 2011-07-30 – 2011-07-31 (×3): 15 mg via INTRAVENOUS
  Filled 2011-07-30 (×4): qty 1

## 2011-07-30 MED ORDER — PHENYLEPHRINE HCL 10 MG/ML IJ SOLN
INTRAMUSCULAR | Status: DC | PRN
  Administered 2011-07-30: 40 ug via INTRAVENOUS
  Administered 2011-07-30: 80 ug via INTRAVENOUS
  Administered 2011-07-30: 40 ug via INTRAVENOUS
  Administered 2011-07-30: 80 ug via INTRAVENOUS
  Administered 2011-07-30 (×2): 40 ug via INTRAVENOUS
  Administered 2011-07-30: 80 ug via INTRAVENOUS

## 2011-07-30 MED ORDER — LISINOPRIL 2.5 MG PO TABS
2.5000 mg | ORAL_TABLET | Freq: Every day | ORAL | Status: DC
  Administered 2011-08-02 – 2011-08-04 (×3): 2.5 mg via ORAL
  Filled 2011-07-30 (×5): qty 1

## 2011-07-30 MED ORDER — 0.9 % SODIUM CHLORIDE (POUR BTL) OPTIME
TOPICAL | Status: DC | PRN
  Administered 2011-07-30: 2000 mL

## 2011-07-30 MED ORDER — DIPHENHYDRAMINE HCL 50 MG/ML IJ SOLN: 12.5000 mg | Freq: Four times a day (QID) | INTRAMUSCULAR | Status: DC | PRN

## 2011-07-30 MED ORDER — SIMVASTATIN 20 MG PO TABS
20.0000 mg | ORAL_TABLET | Freq: Every day | ORAL | Status: DC
  Administered 2011-07-31 – 2011-08-03 (×4): 20 mg via ORAL
  Filled 2011-07-30 (×5): qty 1

## 2011-07-30 MED ORDER — ASPIRIN EC 81 MG PO TBEC
81.0000 mg | DELAYED_RELEASE_TABLET | Freq: Every day | ORAL | Status: DC
  Administered 2011-07-31 – 2011-08-04 (×5): 81 mg via ORAL
  Filled 2011-07-30 (×5): qty 1

## 2011-07-30 MED ORDER — SENNOSIDES-DOCUSATE SODIUM 8.6-50 MG PO TABS
1.0000 | ORAL_TABLET | Freq: Every evening | ORAL | Status: DC | PRN
  Filled 2011-07-30: qty 1

## 2011-07-30 MED ORDER — NALOXONE HCL 0.4 MG/ML IJ SOLN: 0.4000 mg | INTRAMUSCULAR | Status: DC | PRN

## 2011-07-30 MED ORDER — ACETAMINOPHEN 10 MG/ML IV SOLN
1000.0000 mg | Freq: Four times a day (QID) | INTRAVENOUS | Status: AC
  Administered 2011-07-30 – 2011-07-31 (×4): 1000 mg via INTRAVENOUS
  Filled 2011-07-30 (×4): qty 100

## 2011-07-30 MED ORDER — LACTATED RINGERS IV SOLN
INTRAVENOUS | Status: DC | PRN
  Administered 2011-07-30 (×2): via INTRAVENOUS

## 2011-07-30 MED ORDER — VANCOMYCIN HCL IN DEXTROSE 1-5 GM/200ML-% IV SOLN
1000.0000 mg | Freq: Two times a day (BID) | INTRAVENOUS | Status: AC
  Administered 2011-07-30: 1000 mg via INTRAVENOUS
  Filled 2011-07-30 (×2): qty 200

## 2011-07-30 SURGICAL SUPPLY — 72 items
BAG DECANTER FOR FLEXI CONT (MISCELLANEOUS) IMPLANT
BLADE SURG 11 STRL SS (BLADE) ×6 IMPLANT
CANISTER SUCTION 2500CC (MISCELLANEOUS) ×3 IMPLANT
CATH KIT ON Q 5IN SLV (PAIN MANAGEMENT) IMPLANT
CATH ROBINSON RED A/P 22FR (CATHETERS) IMPLANT
CATH THORACIC 28FR (CATHETERS) IMPLANT
CATH THORACIC 36FR (CATHETERS) IMPLANT
CATH THORACIC 36FR RT ANG (CATHETERS) ×3 IMPLANT
CLIP TI MEDIUM 24 (CLIP) ×3 IMPLANT
CLOTH BEACON ORANGE TIMEOUT ST (SAFETY) ×3 IMPLANT
CONT SPEC 4OZ CLIKSEAL STRL BL (MISCELLANEOUS) ×15 IMPLANT
COVER SURGICAL LIGHT HANDLE (MISCELLANEOUS) ×6 IMPLANT
DERMABOND ADVANCED (GAUZE/BANDAGES/DRESSINGS) ×2
DERMABOND ADVANCED .7 DNX12 (GAUZE/BANDAGES/DRESSINGS) ×1 IMPLANT
DRAIN CHANNEL 28F RND 3/8 FF (WOUND CARE) ×3 IMPLANT
DRAPE LAPAROSCOPIC ABDOMINAL (DRAPES) ×3 IMPLANT
DRAPE SLUSH MACHINE 52X66 (DRAPES) ×3 IMPLANT
ELECT BLADE 4.0 EZ CLEAN MEGAD (MISCELLANEOUS) ×6
ELECT REM PT RETURN 9FT ADLT (ELECTROSURGICAL) ×3
ELECTRODE BLDE 4.0 EZ CLN MEGD (MISCELLANEOUS) ×2 IMPLANT
ELECTRODE REM PT RTRN 9FT ADLT (ELECTROSURGICAL) ×1 IMPLANT
ETHICON ENDO SURGERY ×3 IMPLANT
GLOVE BIO SURGEON STRL SZ 6 (GLOVE) ×3 IMPLANT
GLOVE BIO SURGEON STRL SZ 6.5 (GLOVE) ×4 IMPLANT
GLOVE BIO SURGEON STRL SZ7.5 (GLOVE) ×6 IMPLANT
GLOVE BIO SURGEONS STRL SZ 6.5 (GLOVE) ×2
GOWN PREVENTION PLUS XLARGE (GOWN DISPOSABLE) ×3 IMPLANT
GOWN STRL NON-REIN LRG LVL3 (GOWN DISPOSABLE) ×12 IMPLANT
HANDLE STAPLE ENDO GIA SHORT (STAPLE) ×2
KIT BASIN OR (CUSTOM PROCEDURE TRAY) ×3 IMPLANT
KIT ROOM TURNOVER OR (KITS) ×3 IMPLANT
KIT SUCTION CATH 14FR (SUCTIONS) ×3 IMPLANT
NS IRRIG 1000ML POUR BTL (IV SOLUTION) ×6 IMPLANT
PACK CHEST (CUSTOM PROCEDURE TRAY) ×3 IMPLANT
PAD ARMBOARD 7.5X6 YLW CONV (MISCELLANEOUS) ×6 IMPLANT
PENCIL BUTTON HOLSTER BLD 10FT (ELECTRODE) ×3 IMPLANT
RELOAD EGIA 60 MED/THCK PURPLE (STAPLE) ×3 IMPLANT
RELOAD EGIA TRIS TAN 45 CVD (STAPLE) ×9 IMPLANT
SEALANT PROGEL (MISCELLANEOUS) ×3 IMPLANT
SEALANT SURG COSEAL 4ML (VASCULAR PRODUCTS) IMPLANT
SOLUTION ANTI FOG 6CC (MISCELLANEOUS) ×3 IMPLANT
SPONGE GAUZE 4X4 12PLY (GAUZE/BANDAGES/DRESSINGS) ×3 IMPLANT
SPONGE TONSIL 1.25 RF SGL STRG (GAUZE/BANDAGES/DRESSINGS) ×6 IMPLANT
STAPLER ENDO GIA 12MM SHORT (STAPLE) ×1 IMPLANT
SUT BONE WAX W31G (SUTURE) ×3 IMPLANT
SUT CHROMIC 3 0 SH 27 (SUTURE) IMPLANT
SUT ETHILON 3 0 PS 1 (SUTURE) IMPLANT
SUT PROLENE 3 0 SH DA (SUTURE) IMPLANT
SUT PROLENE 4 0 RB 1 (SUTURE) ×2
SUT PROLENE 4-0 RB1 .5 CRCL 36 (SUTURE) ×1 IMPLANT
SUT SILK  1 MH (SUTURE) ×8
SUT SILK 1 MH (SUTURE) ×4 IMPLANT
SUT SILK 2 0 SH CR/8 (SUTURE) ×3 IMPLANT
SUT SILK 2 0SH CR/8 30 (SUTURE) IMPLANT
SUT SILK 3 0SH CR/8 30 (SUTURE) IMPLANT
SUT VIC AB 1 CTX 18 (SUTURE) ×9 IMPLANT
SUT VIC AB 2 TP1 27 (SUTURE) ×6 IMPLANT
SUT VIC AB 2-0 CTX 36 (SUTURE) ×3 IMPLANT
SUT VIC AB 3-0 X1 27 (SUTURE) ×3 IMPLANT
SUT VICRYL 0 UR6 27IN ABS (SUTURE) IMPLANT
SUT VICRYL 2 TP 1 (SUTURE) IMPLANT
SWAB COLLECTION DEVICE MRSA (MISCELLANEOUS) IMPLANT
SYSTEM SAHARA CHEST DRAIN ATS (WOUND CARE) ×3 IMPLANT
TAPE CLOTH SURG 4X10 WHT LF (GAUZE/BANDAGES/DRESSINGS) ×3 IMPLANT
TIP APPLICATOR SPRAY EXTEND 16 (VASCULAR PRODUCTS) ×3 IMPLANT
TOWEL OR 17X24 6PK STRL BLUE (TOWEL DISPOSABLE) ×3 IMPLANT
TOWEL OR 17X26 10 PK STRL BLUE (TOWEL DISPOSABLE) ×6 IMPLANT
TRAP SPECIMEN MUCOUS 40CC (MISCELLANEOUS) IMPLANT
TRAY FOLEY CATH 14FRSI W/METER (CATHETERS) ×3 IMPLANT
TUBE ANAEROBIC SPECIMEN COL (MISCELLANEOUS) IMPLANT
TUNNELER SHEATH ON-Q 11GX8 (MISCELLANEOUS) IMPLANT
WATER STERILE IRR 1000ML POUR (IV SOLUTION) ×6 IMPLANT

## 2011-07-30 NOTE — Progress Notes (Signed)
The patient was examined and preop studies reviewed. There has been no change from the prior exam and the patient is ready for surgery.  The L carotid stenosis was reviewed by Vasc Surg and office followup for carotid endarterectomy was recommended Lacretia Nicks Brabham) after she recovers from thorocotomy.

## 2011-07-30 NOTE — OR Nursing (Signed)
Progel administered  By Lovett Sox intraoperatively; Expiration date 02/17/2013

## 2011-07-30 NOTE — Anesthesia Preprocedure Evaluation (Signed)
Anesthesia Evaluation  Patient identified by MRN, date of birth, ID band Patient awake    Reviewed: Allergy & Precautions, H&P , NPO status , Patient's Chart, lab work & pertinent test results, reviewed documented beta blocker date and time   History of Anesthesia Complications Negative for: history of anesthetic complications  Airway Mallampati: II TM Distance: >3 FB Neck ROM: Full    Dental  (+) Chipped and Dental Advisory Given   Pulmonary Current Smoker (lung mass),  breath sounds clear to auscultation  Pulmonary exam normal       Cardiovascular hypertension, Pt. on medications and Pt. on home beta blockers + CAD (cath 3/13: diffuse disease with some collateral filling, EF 55%, medically managing) - CHF Rhythm:Regular Rate:Normal     Neuro/Psych negative neurological ROS     GI/Hepatic Neg liver ROS, GERD-  Controlled,  Endo/Other  Diabetes mellitus-, Type 2, Insulin Dependent and Oral Hypoglycemic AgentsMorbid obesity  Renal/GU negative Renal ROS     Musculoskeletal   Abdominal (+) + obese,   Peds  Hematology   Anesthesia Other Findings   Reproductive/Obstetrics                           Anesthesia Physical Anesthesia Plan  ASA: III  Anesthesia Plan: General   Post-op Pain Management:    Induction: Intravenous  Airway Management Planned: Oral ETT and Double Lumen EBT  Additional Equipment: Arterial line and CVP  Intra-op Plan:   Post-operative Plan: Extubation in OR  Informed Consent: I have reviewed the patients History and Physical, chart, labs and discussed the procedure including the risks, benefits and alternatives for the proposed anesthesia with the patient or authorized representative who has indicated his/her understanding and acceptance.   Dental advisory given  Plan Discussed with: CRNA and Surgeon  Anesthesia Plan Comments: (Plan routine monitors, A line, CVP,  GETA with DLT)        Anesthesia Quick Evaluation

## 2011-07-30 NOTE — Anesthesia Postprocedure Evaluation (Signed)
  Anesthesia Post-op Note  Patient: Hannah [name]  Procedure(s) Performed: Procedure(s) (LRB): VIDEO ASSISTED THORACOSCOPY (VATS)/ LOBECTOMY (Left)  Patient Location: PACU  Anesthesia Type: General  Level of Consciousness: sedated  Airway and Oxygen Therapy: Patient Spontanous Breathing and Patient connected to nasal cannula oxygen  Post-op Pain: mild  Post-op Assessment: Post-op Vital signs reviewed, Patient's Cardiovascular Status Stable, Respiratory Function Stable, Patent Airway, No signs of Nausea or vomiting and Pain level controlled  Post-op Vital Signs: Reviewed and stable  Complications: No apparent anesthesia complications

## 2011-07-30 NOTE — Progress Notes (Signed)
Results of abg on 07/29/11 called to Dr Morton Peters .(Lawson Fiscal at [phone])

## 2011-07-30 NOTE — Transfer of Care (Signed)
Immediate Anesthesia Transfer of Care Note  Patient: Hannah [name]  Procedure(s) Performed: Procedure(s) (LRB): VIDEO ASSISTED THORACOSCOPY (VATS)/ LOBECTOMY (Left)  Patient Location: PACU  Anesthesia Type: General  Level of Consciousness: awake, oriented and patient cooperative  Airway & Oxygen Therapy: Patient Spontanous Breathing and Patient connected to face mask oxygen  Post-op Assessment: Report given to PACU RN, Post -op Vital signs reviewed and stable and Patient moving all extremities  Post vital signs: Reviewed and stable  Complications: No apparent anesthesia complications

## 2011-07-30 NOTE — Progress Notes (Signed)
Dr.Jackson notified of hypoglycemia. Pt on glucommander which ordered 15cc of D50. Will give and recheck CBG in 15 min.

## 2011-07-30 NOTE — Brief Op Note (Signed)
07/30/2011  3:44 PM  PATIENT:  Hannah [name]  41 y.o. female  PRE-OPERATIVE DIAGNOSIS:  LEFT LOWER LOBE MASS  POST-OPERATIVE DIAGNOSIS:  Adenocarcinoma, margins clear  PROCEDURE:  Procedure(s) (LRB): VIDEO ASSISTED THORACOSCOPY (VATS)/ LOBECTOMY (Left)  SURGEON:  Surgeon(s) and Role:    * Kerin Perna, MD - Primary  PHYSICIAN ASSISTANT: Lowella Dandy PA-C  ANESTHESIA:   general  EBL:  Total I/O In: 1050 [I.V.:1050] Out: 710 [Urine:700; Blood:10]  BLOOD ADMINISTERED:none  DRAINS: Chest tube and Blake drain left chest   LOCAL MEDICATIONS USED:  MARCAINE     SPECIMEN:  Source of Specimen:  Left Lower Lobe of Lung  DISPOSITION OF SPECIMEN:  PATHOLOGY  COUNTS:  YES  PATIENT DISPOSITION:  PACU - hemodynamically stable.

## 2011-07-30 NOTE — Preoperative (Signed)
Beta Blockers   Reason not to administer Beta Blockers:Toprol XL 0630 07/30/2011

## 2011-07-31 ENCOUNTER — Inpatient Hospital Stay (HOSPITAL_COMMUNITY): Payer: Medicare Other

## 2011-07-31 LAB — GLUCOSE, CAPILLARY
Glucose-Capillary: 121 mg/dL — ABNORMAL HIGH (ref 70–99)
Glucose-Capillary: 153 mg/dL — ABNORMAL HIGH (ref 70–99)
Glucose-Capillary: 160 mg/dL — ABNORMAL HIGH (ref 70–99)
Glucose-Capillary: 185 mg/dL — ABNORMAL HIGH (ref 70–99)
Glucose-Capillary: 84 mg/dL (ref 70–99)
Glucose-Capillary: 86 mg/dL (ref 70–99)

## 2011-07-31 LAB — CBC
MCHC: 34.2 g/dL (ref 30.0–36.0)
RDW: 13.9 % (ref 11.5–15.5)

## 2011-07-31 LAB — BASIC METABOLIC PANEL
BUN: 34 mg/dL — ABNORMAL HIGH (ref 6–23)
GFR calc Af Amer: 53 mL/min — ABNORMAL LOW (ref >90)
GFR calc non Af Amer: 46 mL/min — ABNORMAL LOW (ref >90)
Potassium: 4.6 mEq/L (ref 3.5–5.1)
Sodium: 132 mEq/L — ABNORMAL LOW (ref 135–145)

## 2011-07-31 LAB — POCT I-STAT 3, ART BLOOD GAS (G3+)
Acid-base deficit: 4 mmol/L — ABNORMAL HIGH (ref 0.0–2.0)
Bicarbonate: 22 mEq/L (ref 20.0–24.0)
O2 Saturation: 99 %
Patient temperature: 97.6
TCO2: 23 mmol/L (ref 0–100)
pCO2 arterial: 41.2 mmHg (ref 35.0–45.0)
pH, Arterial: 7.333 — ABNORMAL LOW (ref 7.350–7.400)
pO2, Arterial: 128 mmHg — ABNORMAL HIGH (ref 80.0–100.0)

## 2011-07-31 LAB — POCT I-STAT GLUCOSE
Glucose, Bld: 233 mg/dL — ABNORMAL HIGH (ref 70–99)
Operator id: 151301

## 2011-07-31 MED ORDER — POTASSIUM CHLORIDE IN NACL 20-0.45 MEQ/L-% IV SOLN
INTRAVENOUS | Status: DC
  Administered 2011-08-01: 20 mL/h via INTRAVENOUS
  Filled 2011-07-31 (×3): qty 1000

## 2011-07-31 MED ORDER — TRAMADOL HCL 50 MG PO TABS
50.0000 mg | ORAL_TABLET | Freq: Four times a day (QID) | ORAL | Status: DC | PRN
  Administered 2011-08-01 – 2011-08-04 (×2): 50 mg via ORAL
  Filled 2011-07-31 (×3): qty 1

## 2011-07-31 NOTE — Progress Notes (Signed)
1 Day Post-Op Procedure(s) (LRB): VIDEO ASSISTED THORACOSCOPY (VATS)/ LOBECTOMY (Left)                       [address]            [place] [address]          [phone]      Subjective:  L VATS,LLL lobectomy-adenoca by frozen PMH- CAD,EF .40,L ICA stenosis .80  Objective: Vital signs in last 24 hours: Temp:  [97.1 F (36.2 C)-98 F (36.7 C)] 97.9 F (36.6 C) (04/13 0728) Pulse Rate:  [83-100] 93  (04/13 0700) Cardiac Rhythm:  [-] Normal sinus rhythm (04/12 2000) Resp:  [0-19] 18  (04/13 0839) BP: (114-131)/(59-73) 130/67 mmHg (04/13 0700) SpO2:  [98 %-100 %] 100 % (04/13 0839) Arterial Line BP: (84-139)/(56-86) 120/68 mmHg (04/13 0700) Weight:  [218 lb 11.1 oz (99.2 kg)] 218 lb 11.1 oz (99.2 kg) (04/13 0200)  Hemodynamic parameters for last 24 hours:  NSR  Intake/Output from previous day: 04/12 0701 - 04/13 0700 In: 4120.5 [P.O.:540; I.V.:2980.5; IV Piggyback:600] Out: 1610 [Urine:1100; Blood:10; Chest Tube:500] Intake/Output this shift:    EXAM- Lungs clear,no air leak Neuro intact  Lab Results:  [facility] 07/31/11 0412 07/29/11 1555  WBC 10.8* 9.3  HGB 10.8* 11.7*  HCT 31.6* 34.4*  PLT 300 315   BMET:  Basename 07/31/11 0412 07/29/11 1555  NA 132* 131*  K 4.6 4.7  CL 103 103  CO2 21 21  GLUCOSE 91 235*  BUN 34* 23  CREATININE 1.39* 1.08  CALCIUM 7.4* 7.7*    PT/INR:  Basename 07/29/11 1555  LABPROT 13.5  INR 1.01   ABG    Component Value Date/Time   PHART 7.333* 07/31/2011 0422   HCO3 22.0 07/31/2011 0422   TCO2 23 07/31/2011 0422   ACIDBASEDEF 4.0* 07/31/2011 0422   O2SAT 99.0 07/31/2011 0422   CBG (last 3)   Basename 07/31/11 0726 07/31/11 0356 07/30/11 2341  GLUCAP 121* 84 153*    Assessment/Plan: S/P Procedure(s) (LRB): VIDEO ASSISTED THORACOSCOPY (VATS)/ LOBECTOMY (Left) PLAN- DC a-line ,leave both chest tubes in place   LOS: 1 day    VAN TRIGT III,Charleene Callegari 07/31/2011

## 2011-07-31 NOTE — Progress Notes (Signed)
SICU p.m. Rounds  Postop left lower lobectomy maintaining O2 sat 95% No air leak from chest tube with minimal drainage Pain well controlled Sinus rhythm Ambulated in the hallway Stable day today

## 2011-07-31 NOTE — Op Note (Signed)
NAMEMarland Kitchen  Hannah, [name] NO.:  [id number]  MEDICAL RECORD NO.:  [id number]  LOCATION:  2302                         FACILITY:  [facility]  PHYSICIAN:  Kerin Perna, M.D.  DATE OF BIRTH:  01/05/1971  DATE OF PROCEDURE:  07/30/2011 DATE OF DISCHARGE:                              OPERATIVE REPORT   OPERATIONS: 1. Left VATS, left main thoracotomy and left lower lobectomy. 2. Mediastinal lymph node dissection. 3. Placement of On-Q wound irrigation system.  SURGEON:  Kerin Perna, MD  ASSISTANT:  Olevia Perches, PA-C  ANESTHESIA:  General by Dr. Jairo Ben.  PREOPERATIVE DIAGNOSES:  A 2.5-cm left lower lobe mass with positive metabolic activity on PET scan, frozen section, diagnosis of adenocarcinoma.  PREOPERATIVE DIAGNOSES:  A 2.5-cm left lower lobe mass with positive metabolic activity on PET scan, frozen section, diagnosis of adenocarcinoma.  CLINICAL NOTE:  The patient is [name], smoker, patient of Dr. Levy Pupa, who was noted to have a left lower lobe small nodule approximately 18 months ago.  At that time, the PET scan showed no metabolic activity and she was followed with serial CT scans. The serial CT scan showed increase in size of this nodule up to 2 cm to 2.5 cm, and a PET scan subsequently showed that to be with hypermetabolic activity.  There was no evidence of hypermetabolic mediastinal nodal activity or other hypermetabolic metastatic foci.  Her head CT scan was negative for metastatic disease and she was felt to be candidate for surgical resection.  Her past medical history is significant for coronary artery disease documented by cardiac cath with a total occlusion of the right coronary artery, moderate 50% stenosis of the LAD and circumflex with EF of 40%.  She is also documented recently to have left carotid artery stenosis of 80%, which is asymptomatic.  Her pulmonary function testing and blood gases showed  she was a candidate for lobectomy.  I discussed the procedure of left VATS, left lobectomy for treatment of her presumed cancer both in the office and in the preop short-stay area and reviewed the details of surgery including the use of general anesthesia, location of the surgical incisions, the use of postoperative chest tube drainage system, and expected recovery.  I reviewed with the patient the risks to her of this operation including the risks of MI, stroke, bleeding, blood transfusion requirement, pneumonia, ventilator dependence, and death.  She also had recent renal insufficiency with nephrotic syndrome, but improved with a creatinine to 1.6 by the time of surgery.  PROCEDURE:  The patient was brought to the operating room, placed in supine on the operating room table where general anesthesia was induced and a double-lumen endotracheal tube was positioned by the Anesthesia team.  She was turned to expose the left side up.  A proper time-out was performed.  The left chest was prepped and draped as a sterile field.  A small incision was made and the VATS portal was inserted.  Through the VATS port, a camera was inserted.  The left hemothorax was examined. The tumor was not visible as it was on the inferior aspect of the lower lobe just above the  diaphragm.  The camera was removed and a small minithoracotomy incision was made and the ribs were gently spread with a miniature retractor.  The lung was palpated and the mass was felt in the lower lobe.  There were no masses palpable in the upper lobe.  The mediastinal AP window lymph nodes appeared to be benign.  The interlobar fissure was opened and the pulmonary artery identified. The pulmonary artery branch to the superior segment of the lower lobe was encircled with a vessel loop, stapled and divided with the Endo-GIA stapling device.  The basilar segmental trunk was then dissected, encircled with a vessel loop, and stapled and  divided.  Next, the inferior vein was dissected by taking down the inferior ligament and this was encircled with a vessel loop, and stapled with the Endo-GIA stapling device.  Next, the bronchus was dissected from the surrounding hilar tissues and lymph nodes, and stapled with the TA-30 stapling device.  The bronchus was divided distal to the staple line.  The specimen was removed.  It was sent for pathology and frozen section showed the primary to be an adenocarcinoma and the bronchial margin was negative.  Several hilar mediastinal lymph nodes were sent for permanent section.  Warm saline was instilled into the thorax and no significant air leaks were identified with gentle inflation in the left upper lobe.  Medical adhesive was then placed over the hilar structures (ProGel).  Two chest tubes were placed anteriorly and posteriorly and brought out through separate incisions.  Under direct vision, the left upper lobe was then reinflated and filled the space nicely.  The ribs were reapproximated with #2 Vicryl.  The muscle layer was closed in layers using #1 Vicryl.  The subcutaneous and skin layers were closed with running Vicryl and sterile dressings were applied.  The chest tubes were connected to an underwater seal Pleur-Evac drainage system.  The patient was extubated and returned to recovery room in critical, but stable condition.     Kerin Perna, M.D.     PV/MEDQ  D:  07/30/2011  T:  07/31/2011  Job:  [id number]

## 2011-08-01 ENCOUNTER — Inpatient Hospital Stay (HOSPITAL_COMMUNITY): Payer: Medicare Other

## 2011-08-01 DIAGNOSIS — I6529 Occlusion and stenosis of unspecified carotid artery: Secondary | ICD-10-CM

## 2011-08-01 LAB — GLUCOSE, CAPILLARY
Glucose-Capillary: 146 mg/dL — ABNORMAL HIGH (ref 70–99)
Glucose-Capillary: 150 mg/dL — ABNORMAL HIGH (ref 70–99)
Glucose-Capillary: 164 mg/dL — ABNORMAL HIGH (ref 70–99)
Glucose-Capillary: 166 mg/dL — ABNORMAL HIGH (ref 70–99)
Glucose-Capillary: 199 mg/dL — ABNORMAL HIGH (ref 70–99)
Glucose-Capillary: 252 mg/dL — ABNORMAL HIGH (ref 70–99)
Glucose-Capillary: 88 mg/dL (ref 70–99)
Glucose-Capillary: 98 mg/dL (ref 70–99)

## 2011-08-01 LAB — COMPREHENSIVE METABOLIC PANEL
Albumin: 1.3 g/dL — ABNORMAL LOW (ref 3.5–5.2)
BUN: 29 mg/dL — ABNORMAL HIGH (ref 6–23)
Calcium: 7.6 mg/dL — ABNORMAL LOW (ref 8.4–10.5)
Creatinine, Ser: 1.23 mg/dL — ABNORMAL HIGH (ref 0.50–1.10)
Total Protein: 4.1 g/dL — ABNORMAL LOW (ref 6.0–8.3)

## 2011-08-01 LAB — CBC
HCT: 32.8 % — ABNORMAL LOW (ref 36.0–46.0)
MCHC: 32.6 g/dL (ref 30.0–36.0)
MCV: 90.6 fL (ref 78.0–100.0)
RDW: 14 % (ref 11.5–15.5)

## 2011-08-01 MED ORDER — INSULIN ASPART 100 UNIT/ML ~~LOC~~ SOLN
0.0000 [IU] | SUBCUTANEOUS | Status: DC
  Administered 2011-08-02: 8 [IU] via SUBCUTANEOUS
  Administered 2011-08-02: 4 [IU] via SUBCUTANEOUS
  Administered 2011-08-02 (×2): 2 [IU] via SUBCUTANEOUS
  Administered 2011-08-02: 4 [IU] via SUBCUTANEOUS
  Administered 2011-08-02 – 2011-08-03 (×3): 2 [IU] via SUBCUTANEOUS
  Administered 2011-08-03 (×2): 4 [IU] via SUBCUTANEOUS

## 2011-08-01 MED ORDER — METFORMIN HCL 500 MG PO TABS
500.0000 mg | ORAL_TABLET | Freq: Two times a day (BID) | ORAL | Status: DC
  Administered 2011-08-01 – 2011-08-04 (×6): 500 mg via ORAL
  Filled 2011-08-01 (×8): qty 1

## 2011-08-01 MED ORDER — ENOXAPARIN SODIUM 40 MG/0.4ML ~~LOC~~ SOLN
40.0000 mg | SUBCUTANEOUS | Status: DC
  Administered 2011-08-01 – 2011-08-03 (×3): 40 mg via SUBCUTANEOUS
  Filled 2011-08-01 (×4): qty 0.4

## 2011-08-01 MED ORDER — INSULIN ASPART 100 UNIT/ML ~~LOC~~ SOLN
0.0000 [IU] | SUBCUTANEOUS | Status: AC
  Administered 2011-08-01: 1 [IU] via SUBCUTANEOUS

## 2011-08-01 NOTE — Progress Notes (Signed)
Patient examined and record reviewed.Hemodynamics stable,labs satisfactory.Patient had stable day.Continue current care.  Waiting for 3300 bed VAN TRIGT III,Mita Vallo 08/01/2011

## 2011-08-01 NOTE — Progress Notes (Signed)
2 Days Post-Op Procedure(s) (LRB): VIDEO ASSISTED THORACOSCOPY (VATS)/ LOBECTOMY (Left)                    [address]            H[name] [address]          [phone]      Subjective: LLL lobectomy adenoca-pain controlled w/ PCA Past Hx --CAD-EF .40,L ICA stenosis > .80 seen by [facility], nephrotic syndrome. DM, active smoker  Objective: Vital signs in last 24 hours: Temp:  [97.9 F (36.6 C)-98.3 F (36.8 C)] 98.3 F (36.8 C) (04/14 0742) Pulse Rate:  [37-104] 93  (04/14 0800) Cardiac Rhythm:  [-] Normal sinus rhythm (04/14 0800) Resp:  [3-23] 19  (04/14 0800) BP: (91-149)/(48-80) 115/70 mmHg (04/14 0800) SpO2:  [95 %-100 %] 97 % (04/14 0800) Arterial Line BP: (125)/(66) 125/66 mmHg (04/13 1100)  Hemodynamic parameters for last 24 hours:  NSR,good U/O  Intake/Output from previous day: 04/13 0701 - 04/14 0700 In: 1425 [P.O.:720; I.V.:705] Out: 3410 [Urine:3030; Chest Tube:380] Intake/Output this shift: Total I/O In: 40 [I.V.:40] Out: -   EXAM Lungs clear,no airleak, NSR  Lab Results:  [facility] 08/01/11 0418 07/31/11 0412  WBC 7.4 10.8*  HGB 10.7* 10.8*  HCT 32.8* 31.6*  PLT 309 300   BMET:  Basename 08/01/11 0418 07/31/11 0412  NA 134* 132*  K 4.5 4.6  CL 105 103  CO2 25 21  GLUCOSE 92 91  BUN 29* 34*  CREATININE 1.23* 1.39*  CALCIUM 7.6* 7.4*    PT/INR:  Basename 07/29/11 1555  LABPROT 13.5  INR 1.01   ABG    Component Value Date/Time   PHART 7.333* 07/31/2011 0422   HCO3 22.0 07/31/2011 0422   TCO2 23 07/31/2011 0422   ACIDBASEDEF 4.0* 07/31/2011 0422   O2SAT 99.0 07/31/2011 0422   CBG (last 3)   Basename 08/01/11 0744 08/01/11 0416 08/01/11 0003  GLUCAP 166* 88 146*    Assessment/Plan: S/P Procedure(s) (LRB): VIDEO ASSISTED THORACOSCOPY (VATS)/ LOBECTOMY (Left) Plan for transfer to step-down: see transfer orders 3300 Leave 1 chest tube to  Suctuon, cont PCA Resume preop metformin, watch creat   LOS: 2 days    VAN  TRIGT III,Lakeesha Fontanilla 08/01/2011

## 2011-08-01 NOTE — Consult Note (Signed)
[facility] [place]      Consult Note  Patient name: Hannah [name] MRN: [id number] DOB: February 23, 1971 Sex: female  Consulting Physician:  Dr. Morton Peters  Reason for Consult: No chief complaint on file.   HISTORY OF PRESENT ILLNESS: During a pre-operative evaluation for lung cancer resection, the patient underwent screening carotid duples which revealed >80% left carotid stenosis and 60-79% right carotid stenosis.  The patient is asymptomatic.  She denies slurred speech, numbness and weakness in either upper or lower extremity, and amaurosis fugax.  SHe underwent successful lunc cancer resection.  SHe is medically managed for her diabetes, and hypercholesterolemia.  Past Medical History  Diagnosis Date  . Diabetes mellitus   . High cholesterol   . Diabetic retinopathy   . Peripheral neuropathy     "tips of toes"  . Blind right eye   . Nephrotic syndrome   . Lung mass   . Cardiomyopathy   . Hypertension   . CHF (congestive heart failure)   . Headache     Past Surgical History  Procedure Date  . Cesarean section 1991; 1994  . Breast surgery   . Eye surgery   . Breast cyst incision and drainage ?1993    left  . Tubal ligation 1994  . Vitrectomy     bilaterally "right eye once; left eye twice"  . Cardiac catheterization     History   Social History  . Marital Status: Married    Spouse Name: N/A    Number of Children: 3  . Years of Education: N/A   Occupational History  . Not on file.   Social History Main Topics  . Smoking status: Current Everyday Smoker -- 0.5 packs/day for 25 years    Types: Cigarettes  . Smokeless tobacco: Never Used  . Alcohol Use: No  . Drug Use: Yes    Special: Marijuana     "last time for marijuana ~ 1993"  . Sexually Active: Yes   Other Topics Concern  . Not on file   Social History Narrative  . No narrative on file    Family History  Problem Relation Age of Onset  . Coronary artery disease Father   .  Asthma Father   . COPD Father   . Hypertension Father   . Hyperlipidemia Father   . Diabetes Father   . Lung cancer Mother   . Hypertension Mother   . Hyperlipidemia Mother   . Diabetes Mother   . Cancer Mother     lung  . Anesthesia problems Neg Hx   . Hypotension Neg Hx   . Malignant hyperthermia Neg Hx   . Pseudochol deficiency Neg Hx     Allergies as of 07/28/2011 - Review Complete 07/28/2011  Allergen Reaction Noted  . Ciprofloxacin Rash 05/31/2011    No current facility-administered medications on file prior to encounter.   Current Outpatient Prescriptions on File Prior to Encounter  Medication Sig Dispense Refill  . aspirin EC 81 MG tablet Take 81 mg by mouth daily.      . furosemide (LASIX) 40 MG tablet Take 40 mg by mouth daily.      Marland Kitchen glipiZIDE (GLUCOTROL) 5 MG tablet Take 10 mg by mouth 2 (two) times daily before a meal.       . hydrocortisone valerate ointment (WEST-CORT) 0.2 % Apply 1 application topically daily as needed. Apply only to Lower extremities at affected areas.  Wash hands well after application.      Marland Kitchen  isosorbide mononitrate (IMDUR) 30 MG 24 hr tablet Take 1 tablet (30 mg total) by mouth daily.  30 tablet  9  . lisinopril (PRINIVIL,ZESTRIL) 2.5 MG tablet Take 1 tablet (2.5 mg total) by mouth daily.  30 tablet  3  . metFORMIN (GLUCOPHAGE) 1000 MG tablet Take 1,000 mg by mouth 2 (two) times daily.      . pravastatin (PRAVACHOL) 40 MG tablet Take 1 tablet (40 mg total) by mouth every evening.  30 tablet  11     REVIEW OF SYSTEMS: Cardiovascular: No chest pain, chest pressure, palpitations, orthopnea, or dyspnea on exertion. No claudication or rest pain,  No history of DVT or phlebitis. Pulmonary: No productive cough, asthma or wheezing. Neurologic: No weakness, paresthesias, aphasia, or amaurosis. No dizziness. Hematologic: No bleeding problems or clotting disorders. Musculoskeletal: No joint pain or joint swelling. Gastrointestinal: No blood in stool  or hematemesis Genitourinary: No dysuria or hematuria. Psychiatric:: No history of major depression. Integumentary: No rashes or ulcers. Constitutional: No fever or chills.  PHYSICAL EXAMINATION: General: The patient appears their stated age.  Vital signs are BP 115/70  Pulse 93  Temp(Src) 98.3 F (36.8 C) (Oral)  Resp 19  Wt 218 lb 11.1 oz (99.2 kg)  SpO2 97%  LMP 07/21/2011 Pulmonary: Respirations are non-labored HEENT:  No gross abnormalities Abdomen: Soft and non-tender  Musculoskeletal: There are no major deformities.   Neurologic: No focal weakness or paresthesias are detected, Skin: There are no ulcer or rashes noted. Psychiatric: The patient has normal affect. Cardiovascular: There is a regular rate and rhythm without significant murmur appreciated.  Diagnostic Studies: > 80% left carotid stenosis 60-79% right carotid stenosis    Assessment:  Asymptomatic left > right carotid stenosis Plan: I will schedule the patient to come and see me in the office in 1 month to discuss carotid repair, once she has recovered from her lung surgery.     Jorge Ny, M.D. [facility] [place] Office: [phone] Pager:  [phone]

## 2011-08-02 ENCOUNTER — Encounter (HOSPITAL_COMMUNITY): Payer: Self-pay

## 2011-08-02 ENCOUNTER — Encounter: Payer: Medicare Other | Admitting: Physician Assistant

## 2011-08-02 ENCOUNTER — Inpatient Hospital Stay (HOSPITAL_COMMUNITY): Payer: Medicare Other

## 2011-08-02 LAB — BASIC METABOLIC PANEL
BUN: 26 mg/dL — ABNORMAL HIGH (ref 6–23)
CO2: 24 mEq/L (ref 19–32)
Calcium: 7.9 mg/dL — ABNORMAL LOW (ref 8.4–10.5)
Chloride: 103 mEq/L (ref 96–112)
Creatinine, Ser: 0.96 mg/dL (ref 0.50–1.10)
GFR calc Af Amer: 83 mL/min — ABNORMAL LOW (ref >90)
GFR calc non Af Amer: 71 mL/min — ABNORMAL LOW (ref >90)
Glucose, Bld: 163 mg/dL — ABNORMAL HIGH (ref 70–99)
Potassium: 4.8 mEq/L (ref 3.5–5.1)
Sodium: 131 mEq/L — ABNORMAL LOW (ref 135–145)

## 2011-08-02 LAB — GLUCOSE, CAPILLARY
Glucose-Capillary: 130 mg/dL — ABNORMAL HIGH (ref 70–99)
Glucose-Capillary: 154 mg/dL — ABNORMAL HIGH (ref 70–99)
Glucose-Capillary: 162 mg/dL — ABNORMAL HIGH (ref 70–99)
Glucose-Capillary: 190 mg/dL — ABNORMAL HIGH (ref 70–99)
Glucose-Capillary: 228 mg/dL — ABNORMAL HIGH (ref 70–99)
Glucose-Capillary: 246 mg/dL — ABNORMAL HIGH (ref 70–99)

## 2011-08-02 LAB — POCT I-STAT GLUCOSE
Glucose, Bld: 170 mg/dL — ABNORMAL HIGH (ref 70–99)
Operator id: 151301

## 2011-08-02 LAB — CBC
HCT: 34 % — ABNORMAL LOW (ref 36.0–46.0)
Hemoglobin: 11.1 g/dL — ABNORMAL LOW (ref 12.0–15.0)
MCH: 29.7 pg (ref 26.0–34.0)
MCHC: 32.6 g/dL (ref 30.0–36.0)
MCV: 90.9 fL (ref 78.0–100.0)
Platelets: 322 10*3/uL (ref 150–400)
RBC: 3.74 MIL/uL — ABNORMAL LOW (ref 3.87–5.11)
RDW: 14.2 % (ref 11.5–15.5)
WBC: 9.3 10*3/uL (ref 4.0–10.5)

## 2011-08-02 MED ORDER — LACTULOSE 10 GM/15ML PO SOLN
20.0000 g | Freq: Once | ORAL | Status: AC
  Administered 2011-08-02: 20 g via ORAL
  Filled 2011-08-02: qty 30

## 2011-08-02 MED ORDER — GUAIFENESIN ER 600 MG PO TB12
600.0000 mg | ORAL_TABLET | Freq: Two times a day (BID) | ORAL | Status: DC
  Administered 2011-08-02 – 2011-08-04 (×5): 600 mg via ORAL
  Filled 2011-08-02 (×7): qty 1

## 2011-08-02 NOTE — Progress Notes (Signed)
08/02/2011 6:49 PM Dc'ed On Q pump per md order. Pt tolerated well. Will continue to monitor Ashley Royalty, Deatra Canter

## 2011-08-02 NOTE — Progress Notes (Signed)
UR Completed.  Hannah [name] [phone] 08/02/2011  

## 2011-08-02 NOTE — Progress Notes (Addendum)
3 Days Post-Op Procedure(s) (LRB): VIDEO ASSISTED THORACOSCOPY (VATS)/ LOBECTOMY (Left)  Subjective: Patient with constipation and productive cough.  Objective: Vital signs in last 24 hours: Patient Vitals for the past 24 hrs:  BP Temp Temp src Pulse Resp SpO2 Height  08/02/11 0200 134/64 mmHg 98.6 F (37 C) Oral 89  14  99 % -  08/02/11 0000 - - - 99  19  100 % -  08/01/11 2354 - 98.3 F (36.8 C) Oral - - - -  08/01/11 2300 123/67 mmHg - - 89  10  98 % -  08/01/11 2200 114/69 mmHg - - 87  12  98 % -  08/01/11 2100 101/59 mmHg - - 89  12  98 % -  08/01/11 2000 97/50 mmHg - - 89  11  99 % 5\' 7"  (1.702 m)  08/01/11 1926 - - - - - 97 % -  08/01/11 1915 - 97.9 F (36.6 C) Oral - - - -  08/01/11 1900 106/87 mmHg - - 93  20  100 % -  08/01/11 1529 - 98.3 F (36.8 C) Oral - - - -  08/01/11 1454 - - - - - 98 % -  08/01/11 1200 - - - - 16  98 % -  08/01/11 1157 - 98.1 F (36.7 C) Oral - - - -  08/01/11 0955 112/48 mmHg - - 99  17  100 % -  08/01/11 0800 115/70 mmHg - - 93  19  97 % -    Current Weight  07/31/11 218 lb 11.1 oz (99.2 kg)        Intake/Output from previous day: 04/14 0701 - 04/15 0700 In: 71.5 [I.V.:71.5] Out: 1650 [Urine:1350; Chest Tube:300]   Physical Exam:  Cardiovascular: RRR, no murmurs, gallops, or rubs. Pulmonary:Rhonchi on left; no rales, wheezes. Abdomen: Soft, non tender, bowel sounds present. Extremities: No lower extremity edema. Wounds: Clean and dry.  No erythema or signs of infection.  Lab Results: CBC: Basename 08/02/11 0500 08/01/11 0418  WBC 9.3 7.4  HGB 11.1* 10.7*  HCT 34.0* 32.8*  PLT 322 309   BMET:  Basename 08/02/11 0500 08/01/11 0418  NA 131* 134*  K 4.8 4.5  CL 103 105  CO2 24 25  GLUCOSE 163* 92  BUN 26* 29*  CREATININE 0.96 1.23*  CALCIUM 7.9* 7.6*    PT/INR: No results found for this basename: LABPROT,INR in the last 72 hours ABG:  INR: Will add last result for INR, ABG once components are confirmed Will  add last 4 CBG results once components are confirmed  Assessment/Plan:  1. CV - SR. Continue Lisinopril 2.5 daily and Lopressor 25 daily 2.  Pulmonary - Encourage incentive spirometer. CT with 300 cc of output last 24 hours.No obvious air leak. May place to water seal today.CXR this am shows no ptx, some vascular congestion and cardiomegaly. 3.  Acute blood loss anemia - H/H this am 11.1/34. 4.DM-CBGs 98/130/162.Continue Metformin 500 bid. 5.Will follow up with Dr. Cory Roughen in one month regarding carotid artery stenosis. 6.LOC constipation 7.Mucinex for cough. 8.Continue with Lasix today.   ZIMMERMAN,DONIELLE MPA-C 08/02/2011   I have seen and examined the patient and agree with the assessment and plan as outlined.  CXR stable.  Remaining chest tube to water seal.  Annaliese Saez H 08/02/2011 8:08 AM

## 2011-08-02 NOTE — Progress Notes (Signed)
Inpatient Diabetes Program Recommendations  AACE/ADA: New Consensus Statement on Inpatient Glycemic Control (2009)  Target Ranges:  Prepandial:   less than 140 mg/dL      Peak postprandial:   less than 180 mg/dL (1-2 hours)      Critically ill patients:  140 - 180 mg/dL    Results for TWANNA, RESH (MRN [id number]) as of 08/02/2011 14:48  Ref. Range 08/01/2011 00:03 08/01/2011 04:16 08/01/2011 07:44 08/01/2011 12:32 08/01/2011 17:19 08/01/2011 18:35 08/01/2011 20:26  Glucose-Capillary Latest Range: 70-99 mg/dL 409 (H) 88 811 (H) 914 (H) 252 (H) 164 (H) 150 (H)    Results for MCKENZEE, BEEM (MRN [id number]) as of 08/02/2011 14:48  Ref. Range 08/02/2011 07:55 08/02/2011 12:57  Glucose-Capillary Latest Range: 70-99 mg/dL 086 (H) 578 (H)   Patient with elevated postprandial CBGs.  Now on PO Carb modified diet.    Recommend the following:  Inpatient Diabetes Program Recommendations Correction (SSI): Please change current SSI regimen to ac + HS. Insulin - Meal Coverage: Please add meal coverage- Novolog 3 units tid with meals.  Note: Will follow. Ambrose Finland RN, MSN, CDE Diabetes Coordinator Inpatient Diabetes Program [phone]

## 2011-08-03 ENCOUNTER — Inpatient Hospital Stay (HOSPITAL_COMMUNITY): Payer: Medicare Other

## 2011-08-03 LAB — GLUCOSE, CAPILLARY
Glucose-Capillary: 152 mg/dL — ABNORMAL HIGH (ref 70–99)
Glucose-Capillary: 142 mg/dL — ABNORMAL HIGH (ref 70–99)
Glucose-Capillary: 155 mg/dL — ABNORMAL HIGH (ref 70–99)
Glucose-Capillary: 155 mg/dL — ABNORMAL HIGH (ref 70–99)
Glucose-Capillary: 155 mg/dL — ABNORMAL HIGH (ref 70–99)
Glucose-Capillary: 155 mg/dL — ABNORMAL HIGH (ref 70–99)
Glucose-Capillary: 143 mg/dL — ABNORMAL HIGH (ref 70–99)
Glucose-Capillary: 176 mg/dL — ABNORMAL HIGH (ref 70–99)
Glucose-Capillary: 199 mg/dL — ABNORMAL HIGH (ref 70–99)
Glucose-Capillary: 160 mg/dL — ABNORMAL HIGH (ref 70–99)
Glucose-Capillary: 175 mg/dL — ABNORMAL HIGH (ref 70–99)

## 2011-08-03 MED ORDER — LEVALBUTEROL HCL 0.63 MG/3ML IN NEBU
0.6300 mg | INHALATION_SOLUTION | Freq: Four times a day (QID) | RESPIRATORY_TRACT | Status: DC | PRN
  Filled 2011-08-03: qty 3

## 2011-08-03 MED ORDER — LEVALBUTEROL HCL 0.63 MG/3ML IN NEBU
0.6300 mg | INHALATION_SOLUTION | Freq: Two times a day (BID) | RESPIRATORY_TRACT | Status: DC
  Administered 2011-08-03: 0.63 mg via RESPIRATORY_TRACT
  Filled 2011-08-03 (×4): qty 3

## 2011-08-03 MED ORDER — INSULIN ASPART 100 UNIT/ML ~~LOC~~ SOLN
0.0000 [IU] | Freq: Three times a day (TID) | SUBCUTANEOUS | Status: DC
  Administered 2011-08-04: 2 [IU] via SUBCUTANEOUS

## 2011-08-03 NOTE — Progress Notes (Addendum)
4 Days Post-Op Procedure(s) (LRB): VIDEO ASSISTED THORACOSCOPY (VATS)/ LOBECTOMY (Left) Subjective:  Hannah [name] complains of discomfort around her chest tube site.  She is hoping we will remove this today.  Objective: Vital signs in last 24 hours: Temp:  [97.3 F (36.3 C)-98.6 F (37 C)] 97.3 F (36.3 C) (04/16 0802) Pulse Rate:  [85-92] 92  (04/16 0925) Cardiac Rhythm:  [-] Normal sinus rhythm (04/16 0802) Resp:  [14-21] 14  (04/16 0827) BP: (96-137)/(51-72) 115/58 mmHg (04/16 0925) SpO2:  [95 %-100 %] 100 % (04/16 0910)  Intake/Output from previous day: 04/15 0701 - 04/16 0700 In: 480 [P.O.:480] Out: 380 [Chest Tube:380] Intake/Output this shift: Total I/O In: -  Out: 40 [Chest Tube:40]  General appearance: alert, cooperative and no distress Heart: regular rate and rhythm Lungs: rhonchi on left Abdomen: soft, non-tender; bowel sounds normal; no masses,  no organomegaly Extremities: edema 1+ Wound: clean and dry  Lab Results:  Basename 08/02/11 0500 08/01/11 0418  WBC 9.3 7.4  HGB 11.1* 10.7*  HCT 34.0* 32.8*  PLT 322 309   BMET:  Basename 08/02/11 0500 08/01/11 0418  NA 131* 134*  K 4.8 4.5  CL 103 105  CO2 24 25  GLUCOSE 163* 92  BUN 26* 29*  CREATININE 0.96 1.23*  CALCIUM 7.9* 7.6*    PT/INR: No results found for this basename: LABPROT,INR in the last 72 hours ABG    Component Value Date/Time   PHART 7.333* 07/31/2011 0422   HCO3 22.0 07/31/2011 0422   TCO2 23 07/31/2011 0422   ACIDBASEDEF 4.0* 07/31/2011 0422   O2SAT 99.0 07/31/2011 0422   CBG (last 3)   Basename 08/03/11 0730 08/03/11 0337 08/03/11 0042  GLUCAP 143* 142* 152*    Assessment/Plan: S/P Procedure(s) (LRB): VIDEO ASSISTED THORACOSCOPY (VATS)/ LOBECTOMY (Left)  1. CV- NSR on Lisinopril and Lopressor 2. Chest tube- 380cc output last 24hr, no air leak appreciated, likely leave chest tube on water seal 3. Fluid Balance- + LE edema will continue Lasix 4. Cough- improving on Mucinex,  has produced some sputum production 5. CBGs- controlled on Metformin    LOS: 4 days    [name], Hannah 08/03/2011  I have seen and examined the patient and agree with the assessment and plan as outlined.  Chest tube output has decreased substantially today.  Will d/c tubes.  Path results discussed with patient.  Possible d/c home 1-2 days.  Hannah [name] 08/03/2011 2:01 PM

## 2011-08-04 ENCOUNTER — Inpatient Hospital Stay (HOSPITAL_COMMUNITY): Payer: Medicare Other

## 2011-08-04 LAB — GLUCOSE, CAPILLARY: Glucose-Capillary: 154 mg/dL — ABNORMAL HIGH (ref 70–99)

## 2011-08-04 MED ORDER — OXYCODONE-ACETAMINOPHEN 5-325 MG PO TABS: 1.0000 | ORAL_TABLET | ORAL | Status: DC | PRN

## 2011-08-04 MED ORDER — GUAIFENESIN ER 600 MG PO TB12: 600.0000 mg | ORAL_TABLET | Freq: Two times a day (BID) | ORAL | Status: DC

## 2011-08-04 MED ORDER — NICOTINE 21 MG/24HR TD PT24: 1.0000 | MEDICATED_PATCH | Freq: Every day | TRANSDERMAL | Status: DC

## 2011-08-04 NOTE — Discharge Instructions (Signed)
Thoracotomy Care After Refer to this sheet in the next few weeks. These instructions provide you with information on caring for yourself after your procedure. Your caregiver may also give you more specific instructions. Your treatment has been planned according to current medical practices, but problems sometimes occur. Call your caregiver if you have any problems or questions after your procedure. HOME CARE INSTRUCTIONS  Remove the bandage (dressing) over your chest tube site as directed by your caregiver.   It is normal to be sore for a couple weeks following surgery. See your caregiver if this seems to be getting worse rather than better.   Only take over-the-counter or prescription medicines for pain, discomfort, or fever as directed by your caregiver. It is very important to take pain medicine when you need it so that you will cough and breathe deeply enough to clear mucus (phlegm) and expand your lungs. This helps prevent a lung infection (pneumonia).   If it hurts to cough, hold a pillow against your chest when you cough. This may help with the discomfort. In spite of the discomfort, cough frequently.   Taking deep breaths keeps lungs inflated and protects against pneumonia. Most patients will go home with a device called an incentive spirometer that encourages deep breathing.   You may resume a normal diet and activities as directed.   Use showers for bathing until you see your caregiver, or as instructed.   Change dressings if necessary or as directed.   Avoid lifting or driving until you are instructed otherwise.   Make an appointment to see your caregiver for stitch (suture) or staple removal when instructed.   Do not travel by airplane for 2 weeks after the chest tube is removed.  SEEK MEDICAL CARE IF:  You are bleeding from your wounds.   Your heartbeat seems irregular.   You have redness, swelling, or increasing pain in the wounds.   There is pus coming from your  wounds.   There is a bad smell coming from the wound or dressing.  SEEK IMMEDIATE MEDICAL CARE IF:  You have a fever.   You develop a rash.   You have difficulty breathing.   You develop any reaction or side effects to medicines given.   You develop lightheadedness or feel faint.   You develop shortness of breath or chest pain.  MAKE SURE YOU:  Understand these instructions.   Will watch your condition.   Will get help right away if you are not doing well or get worse.  Document Released: 09/18/2010 Document Revised: 03/25/2011 Document Reviewed: 09/18/2010 [facility] Patient Information 2012 [place], [place].  Lung Cancer Lung cancer is a tumor which starts as a growth in your lungs. Cancer is a group of many related diseases that begin in cells, the building blocks of the body. Normally, cells grow and divide to produce more cells only when the body needs them. Sometimes cells keep dividing when new cells are not needed. These extra cells may form a mass of tissue called a growth or tumor. Tumors can be either benign (not cancerous) or malignant (cancerous). Cancer can begin in any organ or tissue of the body. The original tumor (where the tumor started out) is called the primary cancer and is usually named for where it begins.  Lung cancer is the most common cause of cancer death in men and women. There are several different types of lung cancers. Usually, lung cancer is described as either small-cell lung cancer or non-small-cell lung cancer.  Other types of cancer occur in the lungs, including carcinoid and cancers spread from other organs. The types of cancer have different behavior and treatment. CAUSES  This cancer usually starts when the lungs are exposed to harmful chemicals. When you quit smoking, your risk of lung cancer falls each year (but is never the same as a person who has never smoked).  Other risks include:   Radon gas exposure.   Asbestos and other industrial  substance exposure.   Second hand tobacco smoke.   Air pollution.   Family or personal history of lung cancer.   Age over 2.  SYMPTOMS  Lung cancer can cause many symptoms. They depend on the type of cancer, its location and other factors. Symptoms of lung cancer can include:  Cough (either new, different or more severe).   Shortness of breath.   Coughing up blood (hemoptysis).   Chest pain.   Hoarseness.   Swelling of the face.   Drooping eyelid.   Changes in blood tests: low sodium (hyponatremia), high calcium (hypercalcemia) or low blood count (anemia).   Weight loss.  In its early stages, lung cancer may not have symptoms and can be discovered by accident. Many of the symptoms above can be caused by diseases other than lung cancer. DIAGNOSIS  In early lung cancer, the patient often does not notice problems. It usually has spread by the time problems are first noticed. Your caregiver may suspect lung cancer based on your symptoms, your exam or based on tests (such as x-rays) obtained for other reasons. Common tests that help your caregiver diagnose your condition include:  Chest x-ray.   CT scan of the lungs and chest.   Blood tests.  If a tumor is found, a biopsy will be necessary to confirm that cancer is present and to determine the type of cancer. TREATMENT   Surgery offers a hope for a cure if the cancer has not spread and the cancer is not a small cell (oat cell) cancer of the lung. Surgery cannot cure the small cell type of cancer.   Radiation Therapy is a form of high energy X-ray that helps slow or kill the cancer. It is often used along with medications (chemotherapy) to help treat the cancer and control pain.   Chemotherapy is used in combination with surgery in advanced cancer. It is also used in all small cell cancers.   Many new treatments look promising.   Your caregiver can give you more information and discuss treatment options that are best for  your type of cancer.  HOME CARE INSTRUCTIONS   If you smoke, stop!   Take all medications as told.   Keep all appointments with your caregiver and other specialists.   Ask your caregiver if you should see a cancer specialist, if that has not been arranged.   If you require oxygen or breathing equipment, be sure you know how to use it and who to call with questions.   Follow any special diet directions. If you have problems with appetite, ask your caregiver for help.  SEEK MEDICAL CARE IF:  1. You have had a surgical procedure are you are having trouble recovering.  2. You have ongoing weight loss.  3. You have decreased strength or energy past the point when your caregiver said you would feel better.  4. You develop nausea or lightheadedness.  5. You have pain that is not improving.  SEEK IMMEDIATE MEDICAL CARE IF:   You cough up clotted  blood or bright red blood.   Your pain is uncontrolled.   You develop new difficulty breathing or chest pain.   You develop swelling in one or both ankles or legs, or swelling in your face or neck.   You develop new headache or confusion.  Document Released: 07/12/2000 Document Revised: 03/25/2011 Document Reviewed: 04/22/2008 [facility] Patient Information 2012 [place], [place].  Smoking Cessation This document explains the best ways for you to quit smoking and new treatments to help. It lists new medicines that can double or triple your chances of quitting and quitting for good. It also considers ways to avoid relapses and concerns you may have about quitting, including weight gain. NICOTINE: A POWERFUL ADDICTION If you have tried to quit smoking, you know how hard it can be. It is hard because nicotine is a very addictive drug. For some people, it can be as addictive as heroin or cocaine. Usually, people make 2 or 3 tries, or more, before finally being able to quit. Each time you try to quit, you can learn about what helps and what hurts.  Quitting takes hard work and a lot of effort, but you can quit smoking. QUITTING SMOKING IS ONE OF THE MOST IMPORTANT THINGS YOU WILL EVER DO.  You will live longer, feel better, and live better.   The impact on your body of quitting smoking is felt almost immediately:   Within 20 minutes, blood pressure decreases. Pulse returns to its normal level.   After 8 hours, carbon monoxide levels in the blood return to normal. Oxygen level increases.   After 24 hours, chance of heart attack starts to decrease. Breath, hair, and body stop smelling like smoke.   After 48 hours, damaged nerve endings begin to recover. Sense of taste and smell improve.   After 72 hours, the body is virtually free of nicotine. Bronchial tubes relax and breathing becomes easier.   After 2 to 12 weeks, lungs can hold more air. Exercise becomes easier and circulation improves.   Quitting will reduce your risk of having a heart attack, stroke, cancer, or lung disease:   After 1 year, the risk of coronary heart disease is cut in half.   After 5 years, the risk of stroke falls to the same as a nonsmoker.   After 10 years, the risk of lung cancer is cut in half and the risk of other cancers decreases significantly.   After 15 years, the risk of coronary heart disease drops, usually to the level of a nonsmoker.   If you are pregnant, quitting smoking will improve your chances of having a healthy baby.   The people you live with, especially your children, will be healthier.   You will have extra money to spend on things other than cigarettes.  FIVE KEYS TO QUITTING Studies have shown that these 5 steps will help you quit smoking and quit for good. You have the best chances of quitting if you use them together: 6. Get ready.  7. Get support and encouragement.  8. Learn new skills and behaviors.  9. Get medicine to reduce your nicotine addiction and use it correctly.  10. Be prepared for relapse or difficult  situations. Be determined to continue trying to quit, even if you do not succeed at first.  1. GET READY  Set a quit date.   Change your environment.   Get rid of ALL cigarettes, ashtrays, matches, and lighters in your home, car, and place of work.   Do  not let people smoke in your home.   Review your past attempts to quit. Think about what worked and what did not.   Once you quit, do not smoke. NOT EVEN A PUFF!  2. GET SUPPORT AND ENCOURAGEMENT Studies have shown that you have a better chance of being successful if you have help. You can get support in many ways.  Tell your family, friends, and coworkers that you are going to quit and need their support. Ask them not to smoke around you.   Talk to your caregivers (doctor, dentist, nurse, pharmacist, psychologist, and/or smoking counselor).   Get individual, group, or telephone counseling and support. The more counseling you have, the better your chances are of quitting. Programs are available at [employer] and health centers. Call your local health department for information about programs in your area.   Spiritual beliefs and practices may help some smokers quit.   Quit meters are Photographer that keep track of quit statistics, such as amount of "quit-time," cigarettes not smoked, and money saved.   Many smokers find one or more of the many self-help books available useful in helping them quit and stay off tobacco.  3. LEARN NEW SKILLS AND BEHAVIORS  Try to distract yourself from urges to smoke. Talk to someone, go for a walk, or occupy your time with a task.   When you first try to quit, change your routine. Take a different route to work. Drink tea instead of coffee. Eat breakfast in a different place.   Do something to reduce your stress. Take a hot bath, exercise, or read a book.   Plan something enjoyable to do every day. Reward yourself for not smoking.   Explore interactive  web-based programs that specialize in helping you quit.  4. GET MEDICINE AND USE IT CORRECTLY Medicines can help you stop smoking and decrease the urge to smoke. Combining medicine with the above behavioral methods and support can quadruple your chances of successfully quitting smoking. The U[place]. Food and Drug Administration (FDA) has approved 7 medicines to help you quit smoking. These medicines fall into 3 categories.  Nicotine replacement therapy (delivers nicotine to your body without the negative effects and risks of smoking):   Nicotine gum: Available over-the-counter.   Nicotine lozenges: Available over-the-counter.   Nicotine inhaler: Available by prescription.   Nicotine nasal spray: Available by prescription.   Nicotine skin patches (transdermal): Available by prescription and over-the-counter.   Antidepressant medicine (helps people abstain from smoking, but how this works is unknown):   Bupropion sustained-release (SR) tablets: Available by prescription.   Nicotinic receptor partial agonist (simulates the effect of nicotine in your brain):   Varenicline tartrate tablets: Available by prescription.   Ask your caregiver for advice about which medicines to use and how to use them. Carefully read the information on the package.   Everyone who is trying to quit may benefit from using a medicine. If you are pregnant or trying to become pregnant, nursing an infant, you are under age 54, or you smoke fewer than 10 cigarettes per day, talk to your caregiver before taking any nicotine replacement medicines.   You should stop using a nicotine replacement product and call your caregiver if you experience nausea, dizziness, weakness, vomiting, fast or irregular heartbeat, mouth problems with the lozenge or gum, or redness or swelling of the skin around the patch that does not go away.   Do not use any other product containing nicotine  while using a nicotine replacement product.   Talk  to your caregiver before using these products if you have diabetes, heart disease, asthma, stomach ulcers, you had a recent heart attack, you have high blood pressure that is not controlled with medicine, a history of irregular heartbeat, or you have been prescribed medicine to help you quit smoking.  5. BE PREPARED FOR RELAPSE OR DIFFICULT SITUATIONS  Most relapses occur within the first 3 months after quitting. Do not be discouraged if you start smoking again. Remember, most people try several times before they finally quit.   You may have symptoms of withdrawal because your body is used to nicotine. You may crave cigarettes, be irritable, feel very hungry, cough often, get headaches, or have difficulty concentrating.   The withdrawal symptoms are only temporary. They are strongest when you first quit, but they will go away within 10 to 14 days.  Here are some difficult situations to watch for:  Alcohol. Avoid drinking alcohol. Drinking lowers your chances of successfully quitting.   Caffeine. Try to reduce the amount of caffeine you consume. It also lowers your chances of successfully quitting.   Other smokers. Being around smoking can make you want to smoke. Avoid smokers.   Weight gain. Many smokers will gain weight when they quit, usually less than 10 pounds. Eat a healthy diet and stay active. Do not let weight gain distract you from your main goal, quitting smoking. Some medicines that help you quit smoking may also help delay weight gain. You can always lose the weight gained after you quit.   Bad mood or depression. There are a lot of ways to improve your mood other than smoking.  If you are having problems with any of these situations, talk to your caregiver. SPECIAL SITUATIONS AND CONDITIONS Studies suggest that everyone can quit smoking. Your situation or condition can give you a special reason to quit.  Pregnant women/new mothers: By quitting, you protect your baby's health and  your own.   Hospitalized patients: By quitting, you reduce health problems and help healing.   Heart attack patients: By quitting, you reduce your risk of a second heart attack.   Lung, head, and neck cancer patients: By quitting, you reduce your chance of a second cancer.   Parents of children and adolescents: By quitting, you protect your children from illnesses caused by secondhand smoke.  QUESTIONS TO THINK ABOUT Think about the following questions before you try to stop smoking. You may want to talk about your answers with your caregiver.  Why do you want to quit?   If you tried to quit in the past, what helped and what did not?   What will be the most difficult situations for you after you quit? How will you plan to handle them?   Who can help you through the tough times? Your family? Friends? Caregiver?   What pleasures do you get from smoking? What ways can you still get pleasure if you quit?  Here are some questions to ask your caregiver:  How can you help me to be successful at quitting?   What medicine do you think would be best for me and how should I take it?   What should I do if I need more help?   What is smoking withdrawal like? How can I get information on withdrawal?  Quitting takes hard work and a lot of effort, but you can quit smoking. FOR MORE INFORMATION  Smokefree.gov (http://www.davis-sullivan.com/) provides free, accurate,  evidence-based information and professional assistance to help support the immediate and long-term needs of people trying to quit smoking. Document Released: 03/30/2001 Document Revised: 03/25/2011 Document Reviewed: 01/20/2009 [facility] Patient Information 2012 [place], [place].  Carotid Stenosis and Carotid Endarterectomy The carotid arteries are the large arteries in the neck that supply blood to the brain. Carotid stenosis is the narrowing of the carotid arteries. This narrowing can put you at risk for a stroke because it decreases blood  flow to the brain (ischemic stroke or transient ischemic attack [TIA]). Carotid stenosis is also called carotid artery disease. CAUSES  Carotid stenosis is usually caused by a buildup of plaque in the arteries. Plaque is also called atherosclerosis. Plaque can build up in any of your arteries. This commonly occurs as we grow older. SYMPTOMS  Symptoms of decreased blood to your brain include:   Feeling faint.   Numbness or weakness in the arms or legs.   Difficulty with movements.   Difficulty speaking.   Vision problems.  If the arteries are left untreated, you are at risk of having a stroke or TIA. TREATMENT  Nonsurgical Treatment If appropriate, your caregiver will suggest medical options other than surgery. These include taking aspirin or other medicines that thin your blood (anticoagulants). Your caregiver can help you decide if these are acceptable treatment methods for you. Surgical Treatment: Carotid Endarterectomy A carotid endarterectomy is a surgical procedure to clear blockages in the carotid artery. RISKS AND COMPLICATIONS  Bleeding.   Infection.   Stroke or TIA.  BEFORE THE PROCEDURE   Do not eat or drink for as long as directed by your caregiver before the procedure.   Your caregiver will advise you if there are any medicines that need to be withheld before the surgery, and for how long.  PROCEDURE  You will be given a drug to make you sleep (general anesthesia). After you receive the anesthetic, the surgeon will make a small cut (incision) in your neck to expose the artery. An incision is made in the artery and the plaque is removed. The artery is then repaired and the incision is closed in your neck with stitches (sutures). AFTER THE PROCEDURE  You will stay in the hospital right after surgery. Recovery time varies, depending on your age, condition, general health, and other factors. You are usually able to return to a normal lifestyle within a few weeks. HOME  CARE INSTRUCTIONS   It is normal to be sore for a couple weeks after surgery. See your caregiver if this seems to be getting worse rather than better.   Take showers, not baths, for a few days after surgery or until instructed otherwise by your caregiver. Do not take a bath or swim until directed by your surgeon.   Only take over-the-counter or prescription medicines for pain, discomfort, or fever as directed by your caregiver.   An anticoagulant may be prescribed after surgery. This medicine should be taken exactly as directed.   Change bandages (dressings) as directed by your caregiver.   Resume your normal activities as directed by your caregiver.   Avoid lifting until you are instructed otherwise.   Make an appointment to see your caregiver for suture or staple removal when instructed.   Stop smoking if you smoke. This is a grave risk factor.   Stop taking the pill (oral contraceptives) unless your caregiver recommends otherwise.   Maintain good blood pressure control.   Exercise regularly or as instructed.   Lower blood lipids (cholesterol and  triglycerides).   Eat a heart-healthy diet. Manage heart problems if they are contributing to your risk.  SEEK MEDICAL CARE IF:   There is increased bleeding from the wound.   You notice redness, swelling, or increasing pain in the wound.   You notice swelling in your neck or have difficulty breathing or talking.   You notice a bad smell or pus coming from the wound or dressing.   You have an oral temperature above 102 F (38.9 C).  SEEK IMMEDIATE MEDICAL CARE IF:   Your initial symptoms are getting worse instead of better.   You develop any abnormal bruising or bleeding.   You develop a rash.   You have difficulty breathing.   You develop any reaction or side effects to medicine given.   You develop chest pain, shortness of breath, or pain or swelling in your legs.   You have a return of symptoms or problems that  caused you to have this surgery.   You develop a temporary loss of vision.   You develop temporary numbness on one side.   You develop a temporary inability to speak (aphasia).   You develop temporary areas of weakness.   You have problems or concerns that have not been answered.  MAKE SURE YOU:   Understand these instructions.   Will watch your condition.   Will get help right away if you are not doing well or get worse.  Document Released: 04/02/2000 Document Revised: 03/25/2011 Document Reviewed: 10/11/2008 [facility] Patient Information 2012 [place], [place].

## 2011-08-04 NOTE — Progress Notes (Signed)
5 Days Post-Op Procedure(s) (LRB): VIDEO ASSISTED THORACOSCOPY (VATS)/ LOBECTOMY (Left)  Subjective  Ms. Rawl has no complaints this morning.  She is requesting to be discharged home today  Objective: Vital signs in last 24 hours: Temp:  [97.7 F (36.5 C)-98.8 F (37.1 C)] 97.8 F (36.6 C) (04/17 0725) Pulse Rate:  [91-96] 96  (04/17 0725) Cardiac Rhythm:  [-] Normal sinus rhythm (04/17 0851) Resp:  [14-18] 18  (04/17 0725) BP: (111-143)/(53-79) 143/79 mmHg (04/17 0725) SpO2:  [95 %-100 %] 98 % (04/17 0725)   Intake/Output from previous day: 04/16 0701 - 04/17 0700 In: 65.8 [I.V.:65.8] Out: 120 [Chest Tube:120] Intake/Output this shift: Total I/O In: 273.8 [P.O.:240; I.V.:33.8] Out: -   General appearance: alert, cooperative and no distress Neurologic: intact Heart: regular rate and rhythm Lungs: clear to auscultation bilaterally Abdomen: soft, non-tender; bowel sounds normal; no masses,  no organomegaly Extremities: edema trace Wound: clean and dry   Lab Results:  Basename 08/02/11 0500  WBC 9.3  HGB 11.1*  HCT 34.0*  PLT 322   BMET:  Basename 08/02/11 0500  NA 131*  K 4.8  CL 103  CO2 24  GLUCOSE 163*  BUN 26*  CREATININE 0.96  CALCIUM 7.9*    PT/INR: No results found for this basename: LABPROT,INR in the last 72 hours ABG    Component Value Date/Time   PHART 7.333* 07/31/2011 0422   HCO3 22.0 07/31/2011 0422   TCO2 23 07/31/2011 0422   ACIDBASEDEF 4.0* 07/31/2011 0422   O2SAT 99.0 07/31/2011 0422   CBG (last 3)   Basename 08/04/11 0747 08/03/11 2157 08/03/11 2007  GLUCAP 154* 175* 160*    Assessment/Plan: S/P Procedure(s) (LRB): VIDEO ASSISTED THORACOSCOPY (VATS)/ LOBECTOMY (Left)  2.CV-NSR controlled on Lisinopril and Lopressor 3. D/C PCA 4. Fluid Balance- some LE edema- patient on Lasix preoperatively, will continue at discharge 5. Cough- will continue mucinex 6. Dispo- follow up chest xray revealed no evidence of pneumothorax or  pleural effusion.  Patient is requesting d/c home today.  I told patient that I was concerned about her pain control, being her PCA was just d/c this morning and I gave her the option to stay until tomorrow to ensure adequate pain control.  She declined this stating she thinks she will be fine with Percocet she has been receiving here.  She is medically stable at this time and will be discharged home today   LOS: 5 days    Raford Pitcher, Denny Peon 08/04/2011

## 2011-08-04 NOTE — Discharge Summary (Signed)
Physician Discharge Summary  Patient ID: Hannah [name] MRN: [id number] DOB/AGE: 1970-05-12 41 y.o.  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 History of diabetes mellitus 6. History of hyperlipidemia 7. 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 History of diabetes mellitus 6. History of hyperlipidemia 7. History of tobacco abuse  Procedure (s):  1. Left VATS, left main thoracotomy and left lower lobectomy.  2. Mediastinal lymph node dissection.  3. Placement of On-Q wound irrigation system by Dr. Donata Clay on 07/30/2011   Pathology: Invasive well differentiated adenocarcinoma, bronchial margins negative for tumor; lymph nodes negative for tumor. TNM code: pT1, pN0  History of Presenting Illness: This is [name], smoker,  patient of Dr. Levy Pupa, who was noted to have a left lower lobe   nodule approximately 18 months ago. At that time, the PET scan  showed no metabolic activity and she was followed with serial CT scans.  The serial CT scans then showed an increase in the size of this nodule up to 2 cm to  2.5 cm. A PET scan subsequently showed hypermetabolic activity (SUV max 4.6), suspicious for primary bronchogenic neoplasm. There was no evidence of hypermetabolic mediastinal nodal activity or metastatic disease. Her head CT scan was negative for metastatic disease and she was felt to be candidate for surgical resection. Her past medical history is  significant for coronary artery disease. Most recent cardiac cath showed a total occlusion of the right coronary artery, moderate 50% stenosis of  the LAD and  circumflex with EF of 40%. She was also documented recently  to have left carotid artery stenosis of 80%, which is asymptomatic. Her  pulmonary function testing and blood gases showed she was a candidate  for lobectomy. A discussion was had regarding the necessitation for a left VATS, left lobectomy  for treatment of her presumed cancer. Also discussed were the details of surgery including the use of  general anesthesia, location of the surgical incisions, the use of  postoperative chest tube drainage system, and expected recovery. Potential risks to her of this operation including the risks of MI, stroke, bleeding, blood transfusion requirement,  pneumonia, ventilator dependence, and death. She also had recent renal  insufficiency with nephrotic syndrome, but improved with a creatinine to  1.6 by the time of surgery.   Brief Hospital Course:  She remained afebrile and hemodynamically stable. A line was removed on postoperative day #1. Daily chest x-rays were obtained. There was no air leak from her chest tube and blake drain. She was felt surgically stable for transfer from the intensive care unit to 3300 for further convalescence. Her chest tube and blake drain were placed to water seal. Again, there was no air leak. The Harrison Mons drain was removed first (08/01/2011). There was a trace left apical pneumothorax seen. This remained stable and the chest tube was removed on 08/03/2011. Chest x-ray done today showed no pneumothorax, and increasing left base atelectasis. She has are then tolerating a diet has had a bowel movement. She is felt surgically stable for discharge today.  Filed Vitals:   08/04/11 0725  BP: 143/79  Pulse: 96  Temp: 97.8 F (36.6 C)  Resp: 18     Latest  Vital Signs: Blood pressure 143/79, pulse 96, temperature 97.8 F (36.6 C), temperature source Oral, resp. rate 18, height 5\' 7"  (1.702 m), weight 218 lb 11.1 oz (99.2 kg), last menstrual period 07/21/2011, SpO2  98.00%.  Physical Exam: General appearance: alert, cooperative and no distress  Neurologic: intact  Heart: regular rate and rhythm  Lungs: clear to auscultation bilaterally  Abdomen: soft, non-tender; bowel sounds normal; no masses, no organomegaly  Extremities: edema trace  Wound: clean and dry   Discharge Condition:Stable  Recent laboratory studies:  Lab Results  Component Value Date   WBC 9.3 08/02/2011   HGB 11.1* 08/02/2011   HCT 34.0* 08/02/2011   MCV 90.9 08/02/2011   PLT 322 08/02/2011   Lab Results  Component Value Date   NA 131* 08/02/2011   K 4.8 08/02/2011   CL 103 08/02/2011   CO2 24 08/02/2011   CREATININE 0.96 08/02/2011   GLUCOSE 163* 08/02/2011      Diagnostic Studies: Dg Chest 2 View  08/04/2011  *RADIOLOGY REPORT*  Clinical Data: Chest tube removal.  Post lobectomy.  CHEST - 2 VIEW  Comparison: 08/03/2011  Findings: Left central line remains in place, unchanged.  No visible pneumothorax currently.  Increasing left base atelectasis. Subcutaneous air again noted.  IMPRESSION: No visible pneumothorax.  Increasing left base atelectasis.  Original Report Authenticated By: Cyndie Chime, M.D.    Discharge Orders    Future Appointments: Provider: Department: Dept Phone: Center:   08/10/2011 10:15 AM Leslye Peer, MD [facility] [phone] None   08/10/2011 11:00 AM [facility] Pv 2 [facility]  None   08/17/2011 10:00 AM Salley Scarlet, MD [facility] [phone] [facility]   08/23/2011 2:30 PM [facility] [place] [phone] [facility]   08/30/2011 8:45 AM Lewayne Bunting, MD [facility] [phone] [facility]   08/30/2011 9:15 AM [facility] [facility] [phone] [facility]      Discharge Medications: Medication List  As of 08/04/2011  9:36 AM   TAKE these medications         aspirin EC 81 MG tablet   Take 81 mg by mouth daily.      furosemide 40 MG tablet   Commonly known as: LASIX   Take 40 mg by mouth daily.      glipiZIDE  5 MG tablet   Commonly known as: GLUCOTROL   Take 10 mg by mouth 2 (two) times daily before a meal.      guaiFENesin 600 MG 12 hr tablet   Commonly known as: MUCINEX   Take 1 tablet (600 mg total) by mouth 2 (two) times daily.      hydrocortisone valerate ointment 0.2 %   Commonly known as: WEST-CORT   Apply 1 application topically daily as needed. Apply only to Lower extremities at affected areas.  Wash hands well after application.      isosorbide mononitrate 30 MG 24 hr tablet   Commonly known as: IMDUR   Take 1 tablet (30 mg total) by mouth daily.      lisinopril 2.5 MG tablet   Commonly known as: PRINIVIL,ZESTRIL   Take 1 tablet (2.5 mg total) by mouth daily.      metFORMIN 1000 MG tablet   Commonly known as: GLUCOPHAGE   Take 1,000 mg by mouth 2 (two) times daily.      metoprolol succinate 25 MG 24 hr tablet   Commonly known as: TOPROL-XL   Take 25 mg by mouth daily.  nicotine 21 mg/24hr patch   Commonly known as: NICODERM CQ - dosed in mg/24 hours   Place 1 patch onto the skin daily.      oxyCODONE-acetaminophen 5-325 MG per tablet   Commonly known as: PERCOCET   Take 1-2 tablets by mouth every 4 (four) hours as needed.      pravastatin 40 MG tablet   Commonly known as: PRAVACHOL   Take 1 tablet (40 mg total) by mouth every evening.      vitamin B-12 1000 MCG tablet   Commonly known as: CYANOCOBALAMIN   Take 1,000 mcg by mouth daily.            Follow Up Appointments: Follow-up Information    Follow up with [facility] [place] on 08/23/2011. (Appointment time is 2:30pm)    Contact information:   [address] [place] [address] [phone]      Follow up with [facility] on 08/23/2011. (please get chest xray performed at 1:30)       Follow up with Myra Gianotti IV, Lala Lund, MD. Schedule an appointment as soon as possible for a visit in 1 month.   Contact information:   [address] [place]  [address] [phone]          Signed: Doree Fudge MPA-C 08/04/2011, 9:36 AM

## 2011-08-04 NOTE — Progress Notes (Signed)
Wasted 36cc of the pca fentanyl in sink with robyn wofford rn. Beryle Quant

## 2011-08-04 NOTE — Progress Notes (Signed)
Discharge home via w/c with belongings and with husband. D/c instructions, f/u appt., prescriptions, care note on thoracotomy explained and discussed with patient and husband. No complaints voiced at this time. Beryle Quant

## 2011-08-05 ENCOUNTER — Telehealth: Payer: Self-pay | Admitting: Cardiology

## 2011-08-05 NOTE — Telephone Encounter (Signed)
Fu call °Pt returning your call  °

## 2011-08-09 NOTE — Discharge Summary (Signed)
patient examined and medical record reviewed,agree with above note. VAN TRIGT III,Jaquawn Saffran 08/09/2011    

## 2011-08-10 ENCOUNTER — Encounter: Payer: Self-pay | Admitting: Emergency Medicine

## 2011-08-10 ENCOUNTER — Ambulatory Visit (INDEPENDENT_AMBULATORY_CARE_PROVIDER_SITE_OTHER): Payer: Medicare Other | Admitting: Emergency Medicine

## 2011-08-10 VITALS — BP 120/78 | HR 111 | Temp 97.2°F | Ht 67.0 in | Wt 230.2 lb

## 2011-08-10 DIAGNOSIS — C3490 Malignant neoplasm of unspecified part of unspecified bronchus or lung: Secondary | ICD-10-CM

## 2011-08-10 DIAGNOSIS — Z720 Tobacco use: Secondary | ICD-10-CM

## 2011-08-10 DIAGNOSIS — F17200 Nicotine dependence, unspecified, uncomplicated: Secondary | ICD-10-CM

## 2011-08-10 MED ORDER — OXYCODONE-ACETAMINOPHEN 5-325 MG PO TABS: 1.0000 | ORAL_TABLET | ORAL | Status: DC | PRN

## 2011-08-10 NOTE — Telephone Encounter (Signed)
Pt aware of labs  

## 2011-08-10 NOTE — Progress Notes (Signed)
41 yo smoker with hx of HTN, CAD, DM, and a pulm nodule identified initially in 2011, without significant uptake on PET done 02/10/10. Now with repeat CT scan done 06/09/11 that shows an increase in size from 20x32mm to 24x36mm.  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   ROV 08/10/11 -- 41 yo former smoker, s/p LLL lobectomy 4/12 by Dr Maren Beach for dx of adenoCA, negative nodes, ALK negative. She has been doing well, is having incisional pain, ran out of pain meds 2 days ago. She quit smoking 07/29/11!!   EXAM:  Filed Vitals:   08/10/11 1023  BP: 120/78  Pulse: 111  Temp: 97.2 F (36.2 C)   Gen: Pleasant, overweight, in no distress,  normal affect  ENT: No lesions,  mouth clear,  oropharynx clear, no postnasal drip  Neck: No JVD, no TMG, no carotid bruits  Lungs: No use of accessory muscles, no dullness to percussion, clear without rales or rhonchi  Cardiovascular: RRR, heart sounds normal, no murmur or gallops, no peripheral edema  Musculoskeletal: No deformities, no cyanosis or clubbing  Neuro: alert, non focal  Skin: Warm, no lesions or rashes   Adenocarcinoma of lung Post LL lobectomy 07/30/11. Stil w some incisional pain but improving.  - will give short supply pain meds  - repeat Ct's by Dr Maren Beach - likely no role at this time for Oncology referral  Tobacco abuse Quit on 07/29/11. 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 Suspect she will need BD's at some point, but will defer for now.  - re;peat PFT in 1 year to look for interval change post lobectomy

## 2011-08-10 NOTE — Assessment & Plan Note (Signed)
Post LL lobectomy 07/30/11. Stil w some incisional pain but improving.  - will give short supply pain meds  - repeat Ct's by Dr Maren Beach - likely no role at this time for Oncology referral

## 2011-08-10 NOTE — Assessment & Plan Note (Signed)
Quit on 07/29/11. 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 Suspect she will need BD's at some point, but will defer for now.  - re;peat PFT in 1 year to look for interval change post lobectomy

## 2011-08-10 NOTE — Patient Instructions (Signed)
Follow up with Dr Maren Beach as planned for suture removal and to plan for repeat CT scan of the chest Follow with Dr Delton Coombes in 1 year. We will discuss repeat breathing tests at that time.  Call our office sooner if your breathing changes in any way

## 2011-08-17 ENCOUNTER — Ambulatory Visit (INDEPENDENT_AMBULATORY_CARE_PROVIDER_SITE_OTHER): Payer: Medicare Other | Admitting: Family Medicine

## 2011-08-17 ENCOUNTER — Encounter: Payer: Self-pay | Admitting: Family Medicine

## 2011-08-17 VITALS — BP 122/80 | HR 104 | Resp 16 | Ht 67.0 in | Wt 231.4 lb

## 2011-08-17 DIAGNOSIS — R601 Generalized edema: Secondary | ICD-10-CM

## 2011-08-17 DIAGNOSIS — C3490 Malignant neoplasm of unspecified part of unspecified bronchus or lung: Secondary | ICD-10-CM

## 2011-08-17 DIAGNOSIS — R600 Localized edema: Secondary | ICD-10-CM

## 2011-08-17 DIAGNOSIS — R809 Proteinuria, unspecified: Secondary | ICD-10-CM

## 2011-08-17 DIAGNOSIS — Z720 Tobacco use: Secondary | ICD-10-CM

## 2011-08-17 DIAGNOSIS — R609 Edema, unspecified: Secondary | ICD-10-CM

## 2011-08-17 DIAGNOSIS — E119 Type 2 diabetes mellitus without complications: Secondary | ICD-10-CM

## 2011-08-17 DIAGNOSIS — F17200 Nicotine dependence, unspecified, uncomplicated: Secondary | ICD-10-CM

## 2011-08-17 DIAGNOSIS — L97509 Non-pressure chronic ulcer of other part of unspecified foot with unspecified severity: Secondary | ICD-10-CM

## 2011-08-17 NOTE — Assessment & Plan Note (Signed)
Pt with worsened edema since discharge, advised elevating feet as much as possible as this does help, will have her increase lasix to 40mg  BID until Friday, then she will have BMET done to look at renal function and electrolytes, if no improvement, will schedule earlier visit with nephrology, currently scheduled for June

## 2011-08-17 NOTE — Patient Instructions (Signed)
Increase lasix to 40mg  twice a day until Friday Get your BMET drawn for your renal function and potassium Continue diabetes medications Follow up with foot doctor for the ulcer  Congratulations on the smoking  Call and let me know how your legs are doing  If you need more pain meds please let me know F/U June for diabetes

## 2011-08-17 NOTE — Assessment & Plan Note (Signed)
Scheduled with podiatry for next week, chronic slow non healing ulcer, no evidence of infection today

## 2011-08-17 NOTE — Assessment & Plan Note (Signed)
BUN, CR normal on last check 2 weeks, ago, will check again after increased lasix dose Pt following with renal as well

## 2011-08-17 NOTE — Assessment & Plan Note (Signed)
S/p resection, doing well, on percocet for pain control, if she runs out, I have agreed to refill , she is down to twice a day

## 2011-08-17 NOTE — Assessment & Plan Note (Signed)
Cessation for the past 3 weeks, continue to encourage

## 2011-08-17 NOTE — Progress Notes (Signed)
  Subjective:    Patient ID: Hannah [name], female    DOB: 1970-09-03, 41 y.o.   MRN: [id number]  HPI Patient presents for interim followup. She is status post left lower lobectomy secondary to adenocarcinoma. She has a followup appointment on Monday with the surgeon she is doing well. She was seen by pulmonary and her PFTs were unremarkable. She does not need any inhalers at this time we'll follow up in one year.  Foot ulcer she still has ulcer on her right great toe she had to reschedule her appointment secondary to her surgery she will have a visit with the foot doctor next week on the eighth.  Diabetes mellitus-blood sugars were in the 200s fasting this morning was down to 97 fasting. During her admission they gave her a very small amounts of insulin such as 2 units at a time. She started a new diet.  Leg swelling-history of anasarca. She still has significant swelling however now after surgery her swelling in her right leg increased even more than usual. She's been taking a water pill once a day. She does notice the swelling goes down in both legs after her feet have been propped up however she has not been doing this on a regular basis.   Review of Systems  GEN- denies fatigue, fever, weight loss,weakness, recent illness HEENT- denies eye drainage, change in vision, nasal discharge, CVS- denies chest pain, palpitations, +leg edema RESP- denies SOB, cough, wheeze ABD- denies N/V, change in stools, abd pain GU- denies dysuria, hematuria, dribbling, incontinence MSK- denies joint pain, muscle aches, injury Neuro- denies headache, dizziness, syncope, seizure activity      Objective:   Physical Exam GEN- NAD, alert and oriented x3 HEENT- PERRL, EOMI, non injected sclera, pink conjunctiva, MMM, oropharynx clear, unable to see out of right eye Neck- Supple, no JVD CVS- RRR, no murmur, no JVD RESP-CTAB Chest wall- sutures in Left side wall and mid back. Healing diagonal incision on  upper left back EXT- 2+pitting edema  R>l , venous stasis changes Right foot great toe- thickened callus with small ulcer at the center, no erythema or warmth to area, no discharge Pulses- Radial, DP- 2+         Assessment & Plan:

## 2011-08-17 NOTE — Assessment & Plan Note (Signed)
A1C uncontrolled, her glipizide was increased to 10mg  BID, fasting today was more optimal, I will continue her on the glipizide and Metformin, Will repeat A1C at next visit. Will decide at that time on addition of insulin.Hopefully things will settle out for her, she is has also changed her diet

## 2011-08-19 ENCOUNTER — Other Ambulatory Visit: Payer: Self-pay | Admitting: Cardiothoracic Surgery

## 2011-08-19 ENCOUNTER — Telehealth: Payer: Self-pay | Admitting: Family Medicine

## 2011-08-19 DIAGNOSIS — D381 Neoplasm of uncertain behavior of trachea, bronchus and lung: Secondary | ICD-10-CM

## 2011-08-20 ENCOUNTER — Telehealth: Payer: Self-pay | Admitting: Family Medicine

## 2011-08-20 DIAGNOSIS — C3490 Malignant neoplasm of unspecified part of unspecified bronchus or lung: Secondary | ICD-10-CM

## 2011-08-20 MED ORDER — OXYCODONE-ACETAMINOPHEN 5-325 MG PO TABS: 1.0000 | ORAL_TABLET | ORAL | Status: AC | PRN

## 2011-08-20 NOTE — Telephone Encounter (Signed)
Concern addressed

## 2011-08-20 NOTE — Telephone Encounter (Signed)
Came by and saw Dr today and collected her pain medication

## 2011-08-20 NOTE — Telephone Encounter (Signed)
Edema much improved, with lasix 40mg  BID, will continue, i will check Cr on Monday, she feels well, if she has worsening renal function will back down

## 2011-08-23 ENCOUNTER — Ambulatory Visit
Admission: RE | Admit: 2011-08-23 | Discharge: 2011-08-23 | Disposition: A | Discharge status: Home / Self Care | Payer: Self-pay | Source: Ambulatory Visit | Attending: Cardiothoracic Surgery | Admitting: Cardiothoracic Surgery

## 2011-08-23 ENCOUNTER — Ambulatory Visit (INDEPENDENT_AMBULATORY_CARE_PROVIDER_SITE_OTHER): Payer: Self-pay | Admitting: Surgical

## 2011-08-23 VITALS — BP 120/70 | HR 98 | Resp 16 | Ht 67.0 in | Wt 210.0 lb

## 2011-08-23 DIAGNOSIS — C3430 Malignant neoplasm of lower lobe, unspecified bronchus or lung: Secondary | ICD-10-CM

## 2011-08-23 DIAGNOSIS — D381 Neoplasm of uncertain behavior of trachea, bronchus and lung: Secondary | ICD-10-CM

## 2011-08-23 DIAGNOSIS — Z09 Encounter for follow-up examination after completed treatment for conditions other than malignant neoplasm: Secondary | ICD-10-CM

## 2011-08-23 LAB — BASIC METABOLIC PANEL
Chloride: 108 mEq/L (ref 96–112)
Potassium: 5.4 mEq/L — ABNORMAL HIGH (ref 3.5–5.3)
Sodium: 139 mEq/L (ref 135–145)

## 2011-08-23 NOTE — Patient Instructions (Signed)
Discussed with patient. Increase activities as tolerated. Lifting restricted to approximately 20 pounds. Walking continues to be the main form of exercise at this time. She does not drive or do to visual impairment.

## 2011-08-23 NOTE — Progress Notes (Signed)
  HPI: Patient returns for routine postoperative follow-up having undergone left lower lobe lobectomy on 07/30/2011 for a 2.5 cm lesion that was positive for adenocarcinoma. Lymph nodes were negative for malignancy the patient is doing quite well. He denies shortness of breath. She did have some difficulty with cough but this has resolved. She denies fevers, chills or other constitutional symptoms. She is tolerating daily activities quite well. Pain is controlled, however she does use approximately 3-4 times daily.   Current Outpatient Prescriptions  Medication Sig Dispense Refill  . aspirin EC 81 MG tablet Take 81 mg by mouth daily.      Marland Kitchen docusate sodium (STOOL SOFTENER) 100 MG capsule Take 100 mg by mouth daily as needed.      . furosemide (LASIX) 40 MG tablet Take 40 mg by mouth 2 (two) times daily.       Marland Kitchen glipiZIDE (GLUCOTROL) 5 MG tablet Take 10 mg by mouth 2 (two) times daily before a meal.       . guaiFENesin (MUCINEX) 600 MG 12 hr tablet Take 1 tablet (600 mg total) by mouth 2 (two) times daily.  60 tablet  1  . hydrocortisone valerate ointment (WEST-CORT) 0.2 % Apply 1 application topically daily as needed. Apply only to Lower extremities at affected areas.  Wash hands well after application.      . isosorbide mononitrate (IMDUR) 30 MG 24 hr tablet Take 1 tablet (30 mg total) by mouth daily.  30 tablet  9  . lisinopril (PRINIVIL,ZESTRIL) 2.5 MG tablet Take 1 tablet (2.5 mg total) by mouth daily.  30 tablet  3  . metFORMIN (GLUCOPHAGE) 1000 MG tablet Take 1,000 mg by mouth 2 (two) times daily.      . metoprolol succinate (TOPROL-XL) 25 MG 24 hr tablet Take 25 mg by mouth daily.      Marland Kitchen oxyCODONE-acetaminophen (ROXICET) 5-325 MG per tablet Take 1 tablet by mouth every 4 (four) hours as needed for pain.  60 tablet  0  . pravastatin (PRAVACHOL) 40 MG tablet Take 1 tablet (40 mg total) by mouth every evening.  30 tablet  11  . Sennosides (EX-LAX PO) Take by mouth. Daily as needed      .  vitamin B-12 (CYANOCOBALAMIN) 1000 MCG tablet Take 1,000 mcg by mouth daily.        Physical Exam: Gen.-well-developed adult female in no acute distress Pulmonary exam-slightly diminished in the left base Cardiac exam-regular rate and rhythm, S1-S2, 2/6 systolic murmur Incision-healing well without evidence of infection  Diagnostic Tests: A chest x-ray was obtained on today's date that reveals a small left-sided effusion.  Impression/plan Patient is doing quite well. We will see her again in the next 2-3 weeks with an appointment to see Dr. Donata Clay. We will reevaluate the effusion at that time with a chest x-ray. He will decided that time if she needs oncology referral.

## 2011-08-24 ENCOUNTER — Telehealth: Payer: Self-pay | Admitting: Family Medicine

## 2011-08-24 DIAGNOSIS — N19 Unspecified kidney failure: Secondary | ICD-10-CM

## 2011-08-24 NOTE — Telephone Encounter (Signed)
I spoke with Dr. Isa Rankin pt nephrologist and pt, okay to continue laxis 40mg  Twice a day, her swelling is much improved  Will have her get a renal panel in 3 weeks, order faxed over

## 2011-08-30 ENCOUNTER — Other Ambulatory Visit (INDEPENDENT_AMBULATORY_CARE_PROVIDER_SITE_OTHER): Payer: Medicare Other

## 2011-08-30 ENCOUNTER — Ambulatory Visit (INDEPENDENT_AMBULATORY_CARE_PROVIDER_SITE_OTHER): Payer: Medicare Other | Admitting: Cardiology

## 2011-08-30 ENCOUNTER — Encounter: Payer: Self-pay | Admitting: Cardiology

## 2011-08-30 VITALS — BP 160/80 | HR 70 | Ht 67.0 in | Wt 225.0 lb

## 2011-08-30 DIAGNOSIS — I428 Other cardiomyopathies: Secondary | ICD-10-CM

## 2011-08-30 DIAGNOSIS — F17200 Nicotine dependence, unspecified, uncomplicated: Secondary | ICD-10-CM

## 2011-08-30 DIAGNOSIS — E785 Hyperlipidemia, unspecified: Secondary | ICD-10-CM

## 2011-08-30 DIAGNOSIS — Z720 Tobacco use: Secondary | ICD-10-CM

## 2011-08-30 DIAGNOSIS — N289 Disorder of kidney and ureter, unspecified: Secondary | ICD-10-CM

## 2011-08-30 DIAGNOSIS — E7800 Pure hypercholesterolemia, unspecified: Secondary | ICD-10-CM

## 2011-08-30 DIAGNOSIS — I2510 Atherosclerotic heart disease of native coronary artery without angina pectoris: Secondary | ICD-10-CM | POA: Insufficient documentation

## 2011-08-30 LAB — HEPATIC FUNCTION PANEL
ALT: 14 U/L (ref 0–35)
AST: 13 U/L (ref 0–37)
Albumin: 1.7 g/dL — ABNORMAL LOW (ref 3.5–5.2)
Alkaline Phosphatase: 58 U/L (ref 39–117)
Bilirubin, Direct: 0 mg/dL (ref 0.0–0.3)
Total Bilirubin: 0.1 mg/dL — ABNORMAL LOW (ref 0.3–1.2)
Total Protein: 4.5 g/dL — ABNORMAL LOW (ref 6.0–8.3)

## 2011-08-30 LAB — LIPID PANEL
Cholesterol: 240 mg/dL — ABNORMAL HIGH (ref 0–200)
HDL: 47.5 mg/dL (ref >39.00)
Total CHOL/HDL Ratio: 5
Triglycerides: 123 mg/dL (ref 0.0–149.0)
VLDL: 24.6 mg/dL (ref 0.0–40.0)

## 2011-08-30 LAB — BASIC METABOLIC PANEL
CO2: 23 mEq/L (ref 19–32)
Glucose, Bld: 223 mg/dL — ABNORMAL HIGH (ref 70–99)
Potassium: 4.9 mEq/L (ref 3.5–5.1)
Sodium: 135 mEq/L (ref 135–145)

## 2011-08-30 MED ORDER — METOPROLOL SUCCINATE ER 50 MG PO TB24: 50.0000 mg | ORAL_TABLET | Freq: Every day | ORAL | Status: DC

## 2011-08-30 NOTE — Assessment & Plan Note (Signed)
Continue aspirin and statin. 

## 2011-08-30 NOTE — Assessment & Plan Note (Signed)
Patient has discontinued. 

## 2011-08-30 NOTE — Assessment & Plan Note (Signed)
Continue statin. Check lipids and liver. 

## 2011-08-30 NOTE — Progress Notes (Signed)
HPI: 41 year old female for followup of cardiomyopathy and CAD. Recently admitted to [facility] with volume excess. Echocardiogram in February of 2013 showed an ejection fraction of 35-40% but difficult to assess because of tachycardia. There was mild mitral regurgitation. Patient was diagnosed with nephrotic syndrome. CTA showed no pulmonary embolus but there was a left lower lobe mass. Patient has been seen by Dr. Donata Clay and is scheduled for lung resection for probable malignancy. Myoview was performed in March of 2013. This showed an ejection fraction of 49%. There was anterior and inferior ischemia. Cardiac catheterization was performed in March of 2013. There was a 30% proximal LAD followed by a 50% mid and 40% distal. There was an 80% first diagonal. There were multiple 50-60% mid circumflex lesions. There was an 80% OM 1. The right coronary was occluded but filled via collaterals from the left. Ejection fraction 55%. The diagonal and OM were felt to be too small to stent and medical therapy was recommended. 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 Note patient had preoperative carotid Dopplers that showed a greater than 80% left carotid stenosis and a 60-79% right. Seen by vascular surgery and office appointment arranged to consider carotid endarterectomy after she recovers from lung surgery. Since her surgery, she denies dyspnea on exertion, orthopnea, chest pain or syncope. She does have some pedal edema that improves overnight. Her Lasix was recently increased by primary care.  Current Outpatient Prescriptions  Medication Sig Dispense Refill  . aspirin EC 81 MG tablet Take 81 mg by mouth daily.      . furosemide (LASIX) 40 MG tablet Take 40 mg by mouth 2 (two) times daily.       Marland Kitchen glipiZIDE (GLUCOTROL) 5 MG tablet Take 10 mg by mouth 2 (two) times daily before a meal.       . hydrocortisone valerate ointment (WEST-CORT) 0.2 % Apply 1 application  topically daily as needed. Apply only to Lower extremities at affected areas.  Wash hands well after application.      . isosorbide mononitrate (IMDUR) 30 MG 24 hr tablet Take 1 tablet (30 mg total) by mouth daily.  30 tablet  9  . lisinopril (PRINIVIL,ZESTRIL) 2.5 MG tablet Take 1 tablet (2.5 mg total) by mouth daily.  30 tablet  3  . metFORMIN (GLUCOPHAGE) 1000 MG tablet Take 1,000 mg by mouth 2 (two) times daily.      . metoprolol succinate (TOPROL-XL) 25 MG 24 hr tablet Take 25 mg by mouth daily.      Marland Kitchen oxyCODONE-acetaminophen (ROXICET) 5-325 MG per tablet Take 1 tablet by mouth every 4 (four) hours as needed for pain.  60 tablet  0  . pravastatin (PRAVACHOL) 40 MG tablet Take 1 tablet (40 mg total) by mouth every evening.  30 tablet  11  . Sennosides (EX-LAX PO) Take by mouth. Daily as needed      . silver sulfADIAZINE (SILVADENE) 1 % cream as directed.      . vitamin B-12 (CYANOCOBALAMIN) 1000 MCG tablet Take 1,000 mcg by mouth daily.         Past Medical History  Diagnosis Date  . Diabetes mellitus   . High cholesterol   . Diabetic retinopathy   . Peripheral neuropathy     "tips of toes"  . Blind right eye   . Nephrotic syndrome   . Lung mass   . Cardiomyopathy   . Hypertension   . CHF (congestive heart failure)   .  Headache   . Cancer     Adenocarcinoma of lung  . CAD (coronary artery disease)     Past Surgical History  Procedure Date  . Cesarean section 1991; 1994  . Breast surgery   . Eye surgery   . Breast cyst incision and drainage ?1993    left  . Tubal ligation 1994  . Vitrectomy     bilaterally "right eye once; left eye twice"  . Cardiac catheterization   . Lobectomy April 2013    LLL, Dr. Morton Peters    History   Social History  . Marital Status: Married    Spouse Name: N/A    Number of Children: 3  . Years of Education: N/A   Occupational History  . Not on file.   Social History Main Topics  . Smoking status: Former Smoker -- 2.0 packs/day  for 30 years    Types: Cigarettes    Quit date: 08/01/2011  . Smokeless tobacco: Never Used  . Alcohol Use: No  . Drug Use: Yes    Special: Marijuana     "last time for marijuana ~ 1993"  . Sexually Active: Yes   Other Topics Concern  . Not on file   Social History Narrative  . No narrative on file    ROS: no fevers or chills, productive cough, hemoptysis, dysphasia, odynophagia, melena, hematochezia, dysuria, hematuria, rash, seizure activity, orthopnea, PND, pedal edema, claudication. Remaining systems are negative.  Physical Exam: Well-developed well-nourished in no acute distress.  Skin is warm and dry.  HEENT is normal.  Neck is supple.  Chest is clear to auscultation with normal expansion.  Cardiovascular exam is regular rate and rhythm.  Abdominal exam nontender or distended. No masses palpated. Extremities show 1+ edema. neuro grossly intact

## 2011-08-30 NOTE — Patient Instructions (Signed)
Your physician recommends that you schedule a follow-up appointment in: 8 WEEKS WITH DR CRENSHAW  INCREASE METOPROLOL SUCC TO 50 MG ONCE DAILY

## 2011-08-30 NOTE — Assessment & Plan Note (Signed)
Increase Toprol to 50 mg daily. Repeat echocardiogram in 3 months.

## 2011-08-31 ENCOUNTER — Other Ambulatory Visit: Payer: Self-pay | Admitting: *Deleted

## 2011-08-31 DIAGNOSIS — E7800 Pure hypercholesterolemia, unspecified: Secondary | ICD-10-CM

## 2011-08-31 DIAGNOSIS — Z79899 Other long term (current) drug therapy: Secondary | ICD-10-CM

## 2011-08-31 MED ORDER — ROSUVASTATIN CALCIUM 40 MG PO TABS: 40.0000 mg | ORAL_TABLET | Freq: Every day | ORAL | Status: DC

## 2011-09-03 ENCOUNTER — Other Ambulatory Visit: Payer: Self-pay | Admitting: *Deleted

## 2011-09-03 DIAGNOSIS — G8918 Other acute postprocedural pain: Secondary | ICD-10-CM

## 2011-09-03 MED ORDER — TRAMADOL HCL 50 MG PO TABS: 50.0000 mg | ORAL_TABLET | Freq: Four times a day (QID) | ORAL | Status: AC | PRN

## 2011-09-20 ENCOUNTER — Other Ambulatory Visit: Payer: Self-pay | Admitting: Cardiothoracic Surgery

## 2011-09-20 DIAGNOSIS — D381 Neoplasm of uncertain behavior of trachea, bronchus and lung: Secondary | ICD-10-CM

## 2011-09-22 ENCOUNTER — Encounter: Payer: Medicare Other | Admitting: Cardiothoracic Surgery

## 2011-09-24 ENCOUNTER — Ambulatory Visit
Admission: RE | Admit: 2011-09-24 | Discharge: 2011-09-24 | Disposition: A | Discharge status: Home / Self Care | Payer: Medicare Other | Source: Ambulatory Visit | Attending: Cardiothoracic Surgery | Admitting: Cardiothoracic Surgery

## 2011-09-24 ENCOUNTER — Ambulatory Visit (INDEPENDENT_AMBULATORY_CARE_PROVIDER_SITE_OTHER): Payer: Self-pay | Admitting: Cardiothoracic Surgery

## 2011-09-24 ENCOUNTER — Encounter: Payer: Self-pay | Admitting: Cardiothoracic Surgery

## 2011-09-24 VITALS — BP 124/79 | HR 106 | Resp 16 | Ht 67.0 in | Wt 213.0 lb

## 2011-09-24 DIAGNOSIS — D381 Neoplasm of uncertain behavior of trachea, bronchus and lung: Secondary | ICD-10-CM

## 2011-09-24 DIAGNOSIS — N055 Unspecified nephritic syndrome with diffuse mesangiocapillary glomerulonephritis: Secondary | ICD-10-CM

## 2011-09-24 DIAGNOSIS — C3490 Malignant neoplasm of unspecified part of unspecified bronchus or lung: Secondary | ICD-10-CM

## 2011-09-24 DIAGNOSIS — Z09 Encounter for follow-up examination after completed treatment for conditions other than malignant neoplasm: Secondary | ICD-10-CM

## 2011-09-24 DIAGNOSIS — N052 Unspecified nephritic syndrome with diffuse membranous glomerulonephritis: Secondary | ICD-10-CM

## 2011-09-24 DIAGNOSIS — I6529 Occlusion and stenosis of unspecified carotid artery: Secondary | ICD-10-CM

## 2011-09-24 NOTE — Progress Notes (Signed)
PCP is Milinda Antis, MD, MD Referring Provider is Leslye Peer, MD                   [address] [place].[address]            [place] [address]          [phone]       Chief Complaint  Patient presents with  . Routine Post Op    3 wk with cxr...VATS, LLLOBECTOMY    HPI: Status post left lower lobectomy for 12 2013 stage I   T1 N0         Nonsmoker since surgery         Postop left pleural effusion resolved         Asymptomatic bilateral carotid artery stenosis followed by Lacretia Leigh  Past Medical History  Diagnosis Date  . Diabetes mellitus   . High cholesterol   . Diabetic retinopathy   . Peripheral neuropathy     "tips of toes"  . Blind right eye   . Nephrotic syndrome   . Lung mass   . Cardiomyopathy   . Hypertension   . CHF (congestive heart failure)   . Headache   . Cancer     Adenocarcinoma of lung  . CAD (coronary artery disease)     Past Surgical History  Procedure Date  . Cesarean section 1991; 1994  . Breast surgery   . Eye surgery   . Breast cyst incision and drainage ?1993    left  . Tubal ligation 1994  . Vitrectomy     bilaterally "right eye once; left eye twice"  . Cardiac catheterization   . Lobectomy April 2013    LLL, Dr. Morton Peters    Family History  Problem Relation Age of Onset  . Coronary artery disease Father   . Asthma Father   . COPD Father   . Hypertension Father   . Hyperlipidemia Father   . Diabetes Father   . Lung cancer Mother   . Hypertension Mother   . Hyperlipidemia Mother   . Diabetes Mother   . Cancer Mother     lung  . Anesthesia problems Neg Hx   . Hypotension Neg Hx   . Malignant hyperthermia Neg Hx   . Pseudochol deficiency Neg Hx     Social History History  Substance Use Topics  . Smoking status: Former Smoker -- 2.0 packs/day for 30 years    Types: Cigarettes    Quit date: 08/01/2011  . Smokeless tobacco: Never Used  . Alcohol Use: No    Current Outpatient Prescriptions  Medication Sig  Dispense Refill  . acetaminophen (TYLENOL) 500 MG tablet Take 500 mg by mouth every 6 (six) hours as needed.      Marland Kitchen aspirin EC 81 MG tablet Take 81 mg by mouth daily.      . furosemide (LASIX) 40 MG tablet Take 40 mg by mouth 2 (two) times daily.       Marland Kitchen glipiZIDE (GLUCOTROL) 5 MG tablet Take 10 mg by mouth 2 (two) times daily before a meal.       . hydrocortisone valerate ointment (WEST-CORT) 0.2 % Apply 1 application topically daily as needed. Apply only to Lower extremities at affected areas.  Wash hands well after application.      . isosorbide mononitrate (IMDUR) 30 MG 24 hr tablet Take 1 tablet (30 mg total) by mouth daily.  30 tablet  9  .  lisinopril (PRINIVIL,ZESTRIL) 2.5 MG tablet Take 1 tablet (2.5 mg total) by mouth daily.  30 tablet  3  . metFORMIN (GLUCOPHAGE) 1000 MG tablet Take 1,000 mg by mouth 2 (two) times daily.      . metoprolol succinate (TOPROL-XL) 50 MG 24 hr tablet Take 1 tablet (50 mg total) by mouth daily.  30 tablet  12  . rosuvastatin (CRESTOR) 40 MG tablet Take 1 tablet (40 mg total) by mouth daily.  30 tablet  12  . Sennosides (EX-LAX PO) Take by mouth. Daily as needed      . silver sulfADIAZINE (SILVADENE) 1 % cream as directed.      . vitamin B-12 (CYANOCOBALAMIN) 1000 MCG tablet Take 1,000 mcg by mouth daily.        Allergies  Allergen Reactions  . Ciprofloxacin Rash    Review of Systems minimal postthoracotomy pain some dyspnea with exertion incision well-healed no fever  BP 124/79  Pulse 106  Resp 16  Ht 5\' 7"  (1.702 m)  Wt 213 lb (96.616 kg)  BMI 33.36 kg/m2  SpO2 98%  LMP 09/17/2011 Physical Exam Alert and comfortable Breath sounds equal bilaterally Left mini thoracotomy incision well-healed Cardiac rhythm regular No peripheral edema  Diagnostic Tests:  Chest x-ray clear resolution of previous postop pleural effusion Impression: Stable course 6 weeks postop she returned for a 3 month followup chest x-ray than surveillance CT scans in 6  months and 12 months postop She would be ready for carotid surgery 3 months postop if cleared by Dr. Jens Som  Plan: Return for followup chest x-ray 3 months postop

## 2011-09-24 NOTE — Patient Instructions (Signed)
The patient may drive The patient may perform normal daily activities The patient may get any swimming pool Followup chest x-ray in 2 months

## 2011-10-06 ENCOUNTER — Ambulatory Visit: Payer: Medicare Other | Admitting: Family Medicine

## 2011-10-11 ENCOUNTER — Ambulatory Visit: Payer: Medicare Other | Admitting: Cardiology

## 2011-10-12 ENCOUNTER — Encounter: Payer: Self-pay | Admitting: Family Medicine

## 2011-10-12 ENCOUNTER — Ambulatory Visit (INDEPENDENT_AMBULATORY_CARE_PROVIDER_SITE_OTHER): Payer: Medicare Other | Admitting: Family Medicine

## 2011-10-12 VITALS — BP 144/86 | HR 99 | Resp 16 | Ht 67.0 in | Wt 233.4 lb

## 2011-10-12 DIAGNOSIS — E119 Type 2 diabetes mellitus without complications: Secondary | ICD-10-CM

## 2011-10-12 DIAGNOSIS — L97509 Non-pressure chronic ulcer of other part of unspecified foot with unspecified severity: Secondary | ICD-10-CM

## 2011-10-12 DIAGNOSIS — I10 Essential (primary) hypertension: Secondary | ICD-10-CM

## 2011-10-12 DIAGNOSIS — I872 Venous insufficiency (chronic) (peripheral): Secondary | ICD-10-CM

## 2011-10-12 DIAGNOSIS — R809 Proteinuria, unspecified: Secondary | ICD-10-CM

## 2011-10-12 DIAGNOSIS — R197 Diarrhea, unspecified: Secondary | ICD-10-CM

## 2011-10-12 LAB — BASIC METABOLIC PANEL
Chloride: 102 mEq/L (ref 96–112)
Potassium: 5.1 mEq/L (ref 3.5–5.3)

## 2011-10-12 LAB — HEMOGLOBIN A1C: Mean Plasma Glucose: 258 mg/dL — ABNORMAL HIGH (ref <117)

## 2011-10-12 LAB — CBC WITH DIFFERENTIAL/PLATELET
Basophils Absolute: 0.1 10*3/uL (ref 0.0–0.1)
HCT: 35.3 % — ABNORMAL LOW (ref 36.0–46.0)
Lymphocytes Relative: 19 % (ref 12–46)
Monocytes Absolute: 0.4 10*3/uL (ref 0.1–1.0)
Neutro Abs: 5.5 10*3/uL (ref 1.7–7.7)
Platelets: 349 10*3/uL (ref 150–400)
RBC: 3.84 MIL/uL — ABNORMAL LOW (ref 3.87–5.11)
RDW: 13 % (ref 11.5–15.5)
WBC: 7.7 10*3/uL (ref 4.0–10.5)

## 2011-10-12 MED ORDER — FUROSEMIDE 40 MG PO TABS: 40.0000 mg | ORAL_TABLET | Freq: Two times a day (BID) | ORAL | Status: DC

## 2011-10-12 MED ORDER — GLIPIZIDE 10 MG PO TABS: 10.0000 mg | ORAL_TABLET | Freq: Two times a day (BID) | ORAL | Status: DC

## 2011-10-12 NOTE — Assessment & Plan Note (Signed)
Recheck lytes and renal function in setting diarrhea, discussed with nephrology previously increased lasix dose

## 2011-10-12 NOTE — Assessment & Plan Note (Signed)
No evidence of infection today, f/u with podiatry, slow healing with edema and diabetes mellitus

## 2011-10-12 NOTE — Assessment & Plan Note (Signed)
Recheck A1C today, continue oral meds until results seen

## 2011-10-12 NOTE — Assessment & Plan Note (Signed)
Exam benign, she has tried pepto bismol once,no fever, tolerating by mouth. No recent antibiotics. Will treat supportively, stay hydrated, labs per above, immodium

## 2011-10-12 NOTE — Patient Instructions (Addendum)
I will send a new meter Continue current dose of lasix Try immodium for the diarrhea If you have fever, pain or inability eat please call  Get the labs done- I will call with lab results  F/U 3 months

## 2011-10-12 NOTE — Progress Notes (Signed)
  Subjective:    Patient ID: Hannah [name], female    DOB: Sep 14, 1970, 41 y.o.   MRN: [id number]  HPI Patient presents to followup diabetes mellitus and leg swelling. She's been seen by CT surgery, podiatry, as well as her primary cardiologist. (Notes reviewed except podiatry)Labs were obtained which showed improved cholesterol however still not at goal therefore patient was changed to Crestor. Her leg swelling was much improved until approximately one week ago. She denies any heavy use of salted foods. She states that she is on her menses intensive swelling gain fluid around that time. She's tolerating her Lasix twice a day, she had run out of Lasix for 2 days. She does admit to some loose stools for the past week as well 5 times a day. She denies any blood, abdominal pain, fever, nausea vomiting. She needs a prescription for new glucometer. She has been out of her metformin for the past couple of days. Overall she feels well.   Review of Systems  GEN- denies fatigue, fever, weight loss,weakness, recent illness HEENT- denies eye drainage, change in vision, nasal discharge, CVS- denies chest pain, palpitations, +leg swelling  RESP- denies SOB, cough, wheeze ABD- denies N/V, +change in stools, abd pain GU- denies dysuria, hematuria, dribbling, incontinence MSK- denies joint pain, muscle aches, injury Neuro- denies headache, dizziness, syncope, seizure activity      Objective:   Physical Exam GEN- NAD, alert and oriented x3, obese, well appearing HEENT- PERRL, EOMI, non injected sclera, pink conjunctiva, MMM, oropharynx clear, unable to see out of right eye Neck- Supple, no JVD CVS- RRR, no murmur, no JVD RESP-CTAB ABD-NABS,soft, NT,ND, EXT- 1+pitting edema to shins, venous stasis changes Right foot great toe- thickened callus with small ulcer at the center, no erythema or warmth to area, no discharge Pulses- Radial, DP- 2+        Assessment & Plan:

## 2011-10-12 NOTE — Assessment & Plan Note (Signed)
BP improved with increase in beta blocker I think her fluid status today is also contributing to elevated pressures, no change to meds

## 2011-10-12 NOTE — Assessment & Plan Note (Signed)
Increased edema in setting of menses, pt feels this is typical for her, will continue lasix at current dose, she will call me if the edema does not resolve , previously doing well on 40mg  BID

## 2011-10-13 ENCOUNTER — Telehealth: Payer: Self-pay | Admitting: Family Medicine

## 2011-10-13 ENCOUNTER — Other Ambulatory Visit: Payer: Medicare Other

## 2011-10-13 MED ORDER — INSULIN GLARGINE 100 UNIT/ML ~~LOC~~ SOLN: 10.0000 [IU] | Freq: Every day | SUBCUTANEOUS | Status: DC

## 2011-10-13 NOTE — Telephone Encounter (Signed)
She has used lantus before, aware of how to inject

## 2011-10-25 ENCOUNTER — Ambulatory Visit: Payer: Medicare Other | Admitting: Cardiology

## 2011-11-18 ENCOUNTER — Other Ambulatory Visit: Payer: Self-pay | Admitting: Cardiothoracic Surgery

## 2011-11-18 DIAGNOSIS — C3490 Malignant neoplasm of unspecified part of unspecified bronchus or lung: Secondary | ICD-10-CM

## 2011-11-24 ENCOUNTER — Ambulatory Visit: Payer: Medicare Other | Admitting: Cardiothoracic Surgery

## 2011-12-07 ENCOUNTER — Encounter: Payer: Medicare Other | Admitting: Cardiology

## 2011-12-07 NOTE — Progress Notes (Signed)
HPI: 41 year old female for followup of cardiomyopathy and CAD. Previously admitted to [facility] with volume excess. Echocardiogram in February of 2013 showed an ejection fraction of 35-40% but difficult to assess because of tachycardia. There was mild mitral regurgitation. Patient was diagnosed with nephrotic syndrome. CTA showed no pulmonary embolus but there was a left lower lobe mass. Myoview was performed in March of 2013. This showed an ejection fraction of 49%. There was anterior and inferior ischemia. Cardiac catheterization was performed in March of 2013. There was a 30% proximal LAD followed by a 50% mid and 40% distal. There was an 80% first diagonal. There were multiple 50-60% mid circumflex lesions. There was an 80% OM 1. The right coronary was occluded but filled via collaterals from the left. Ejection fraction 55%. The diagonal and OM were felt too be too small to stent and medical therapy was recommended. 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 Note patient had preoperative carotid Dopplers that showed a greater than 80% left carotid stenosis and a 60-79% right. Seen by vascular surgery and office appointment arranged to consider carotid endarterectomy after she recovers from lung surgery. I last saw her in May of 2013. Since then,    Current Outpatient Prescriptions  Medication Sig Dispense Refill  . acetaminophen (TYLENOL) 500 MG tablet Take 500 mg by mouth every 6 (six) hours as needed.      Marland Kitchen aspirin EC 81 MG tablet Take 81 mg by mouth daily.      . furosemide (LASIX) 40 MG tablet Take 1 tablet (40 mg total) by mouth 2 (two) times daily.  180 tablet  1  . glipiZIDE (GLUCOTROL) 10 MG tablet Take 1 tablet (10 mg total) by mouth 2 (two) times daily before a meal.  180 tablet  1  . hydrocortisone valerate ointment (WEST-CORT) 0.2 % Apply 1 application topically daily as needed. Apply only to Lower extremities at affected areas.  Wash  hands well after application.      . insulin glargine (LANTUS SOLOSTAR) 100 UNIT/ML injection Inject 10 Units into the skin at bedtime.  6 mL  3  . isosorbide mononitrate (IMDUR) 30 MG 24 hr tablet Take 1 tablet (30 mg total) by mouth daily.  30 tablet  9  . lisinopril (PRINIVIL,ZESTRIL) 2.5 MG tablet Take 1 tablet (2.5 mg total) by mouth daily.  30 tablet  3  . metFORMIN (GLUCOPHAGE) 1000 MG tablet Take 1,000 mg by mouth 2 (two) times daily.      . metoprolol succinate (TOPROL-XL) 50 MG 24 hr tablet Take 1 tablet (50 mg total) by mouth daily.  30 tablet  12  . rosuvastatin (CRESTOR) 40 MG tablet Take 1 tablet (40 mg total) by mouth daily.  30 tablet  12  . silver sulfADIAZINE (SILVADENE) 1 % cream as directed.      . vitamin B-12 (CYANOCOBALAMIN) 1000 MCG tablet Take 1,000 mcg by mouth daily.         Past Medical History  Diagnosis Date  . Diabetes mellitus   . High cholesterol   . Diabetic retinopathy   . Peripheral neuropathy     "tips of toes"  . Blind right eye   . Nephrotic syndrome   . Lung mass   . Cardiomyopathy   . Hypertension   . CHF (congestive heart failure)   . Headache   . Cancer     Adenocarcinoma of lung  . CAD (coronary artery disease)  Past Surgical History  Procedure Date  . Cesarean section 1991; 1994  . Breast surgery   . Eye surgery   . Breast cyst incision and drainage ?1993    left  . Tubal ligation 1994  . Vitrectomy     bilaterally "right eye once; left eye twice"  . Cardiac catheterization   . Lobectomy April 2013    LLL, Dr. Morton Peters    History   Social History  . Marital Status: Married    Spouse Name: N/A    Number of Children: 3  . Years of Education: N/A   Occupational History  . Not on file.   Social History Main Topics  . Smoking status: Former Smoker -- 2.0 packs/day for 30 years    Types: Cigarettes    Quit date: 08/01/2011  . Smokeless tobacco: Never Used  . Alcohol Use: No  . Drug Use: Yes    Special:  Marijuana     "last time for marijuana ~ 1993"  . Sexually Active: Yes   Other Topics Concern  . Not on file   Social History Narrative  . No narrative on file    ROS: no fevers or chills, productive cough, hemoptysis, dysphasia, odynophagia, melena, hematochezia, dysuria, hematuria, rash, seizure activity, orthopnea, PND, pedal edema, claudication. Remaining systems are negative.  Physical Exam: Well-developed well-nourished in no acute distress.  Skin is warm and dry.  HEENT is normal.  Neck is supple. No thyromegaly.  Chest is clear to auscultation with normal expansion.  Cardiovascular exam is regular rate and rhythm.  Abdominal exam nontender or distended. No masses palpated. Extremities show no edema. neuro grossly intact  ECG     This encounter was created in error - please disregard.

## 2011-12-09 ENCOUNTER — Other Ambulatory Visit: Payer: Self-pay | Admitting: Family Medicine

## 2011-12-17 NOTE — Telephone Encounter (Signed)
Refill request handled by Nurse Hudy.

## 2011-12-22 ENCOUNTER — Ambulatory Visit: Payer: Medicare Other | Admitting: Cardiothoracic Surgery

## 2011-12-24 ENCOUNTER — Ambulatory Visit: Payer: Medicare Other | Admitting: Cardiothoracic Surgery

## 2012-01-11 ENCOUNTER — Ambulatory Visit: Payer: Medicare Other | Admitting: Family Medicine

## 2012-01-21 ENCOUNTER — Encounter: Payer: Self-pay | Admitting: Cardiology

## 2012-01-21 ENCOUNTER — Ambulatory Visit (INDEPENDENT_AMBULATORY_CARE_PROVIDER_SITE_OTHER): Payer: Medicare Other | Admitting: Cardiology

## 2012-01-21 VITALS — BP 124/83 | HR 96 | Ht 67.0 in | Wt 214.8 lb

## 2012-01-21 DIAGNOSIS — E785 Hyperlipidemia, unspecified: Secondary | ICD-10-CM

## 2012-01-21 DIAGNOSIS — I679 Cerebrovascular disease, unspecified: Secondary | ICD-10-CM

## 2012-01-21 DIAGNOSIS — R809 Proteinuria, unspecified: Secondary | ICD-10-CM

## 2012-01-21 DIAGNOSIS — I428 Other cardiomyopathies: Secondary | ICD-10-CM

## 2012-01-21 DIAGNOSIS — F17200 Nicotine dependence, unspecified, uncomplicated: Secondary | ICD-10-CM

## 2012-01-21 DIAGNOSIS — I10 Essential (primary) hypertension: Secondary | ICD-10-CM

## 2012-01-21 DIAGNOSIS — I2510 Atherosclerotic heart disease of native coronary artery without angina pectoris: Secondary | ICD-10-CM

## 2012-01-21 DIAGNOSIS — I429 Cardiomyopathy, unspecified: Secondary | ICD-10-CM

## 2012-01-21 DIAGNOSIS — Z720 Tobacco use: Secondary | ICD-10-CM

## 2012-01-21 NOTE — Assessment & Plan Note (Signed)
Continue aspirin and statin. Patient encouraged to followup with vascular surgery as apparently she needs carotid endarterectomy. She agreed to do this.

## 2012-01-21 NOTE — Assessment & Plan Note (Signed)
Continue statin. 

## 2012-01-21 NOTE — Assessment & Plan Note (Signed)
Patient has discontinued. 

## 2012-01-21 NOTE — Assessment & Plan Note (Signed)
Continue aspirin and statin. 

## 2012-01-21 NOTE — Patient Instructions (Addendum)
Your physician wants you to follow-up in: 6 MONTHS WITH DR Jens Som You will receive a reminder letter in the mail two months in advance. If you don't receive a letter, please call our office to schedule the follow-up appointment.   STOP ISOSORBIDE  START TAKING LISINOPRIL IN THE EVENING  Your physician has requested that you have an echocardiogram. Echocardiography is a painless test that uses sound waves to create images of your heart. It provides your doctor with information about the size and shape of your heart and how well your heart's chambers and valves are working. This procedure takes approximately one hour. There are no restrictions for this procedure.

## 2012-01-21 NOTE — Progress Notes (Signed)
HPI: Pleasant female for followup of cardiomyopathy and CAD. Previously admitted to [facility] with volume excess. Echocardiogram in February of 2013 showed an ejection fraction of 35-40% but difficult to assess because of tachycardia. There was mild mitral regurgitation. Myoview was performed in March of 2013. This showed an ejection fraction of 49%. There was anterior and inferior ischemia. Cardiac catheterization was performed in March of 2013. There was a 30% proximal LAD followed by a 50% mid and 40% distal. There was an 80% first diagonal. There were multiple 50-60% mid circumflex lesions. There was an 80% OM 1. The right coronary was occluded but filled via collaterals from the left. Ejection fraction 55%. The diagonal and OM were felt to be too small to stent and medical therapy was recommended. Carotid Dopplers in April of 2013 showed a greater than 80% left carotid stenosis and a 60-79% right. Seen by vascular surgery and office appointment arranged to consider carotid endarterectomy. I last saw her in May of 2013. Since then, she denies dyspnea, chest pain, pedal edema or syncope. She has discontinued her tobacco use.   Current Outpatient Prescriptions  Medication Sig Dispense Refill  . aspirin EC 81 MG tablet Take 81 mg by mouth daily.      . furosemide (LASIX) 40 MG tablet Take 1 tablet (40 mg total) by mouth 2 (two) times daily.  180 tablet  1  . glipiZIDE (GLUCOTROL) 10 MG tablet Take 1 tablet (10 mg total) by mouth 2 (two) times daily before a meal.  180 tablet  1  . hydrocortisone valerate ointment (WEST-CORT) 0.2 % Apply 1 application topically daily as needed. Apply only to Lower extremities at affected areas.  Wash hands well after application.      . insulin glargine (LANTUS SOLOSTAR) 100 UNIT/ML injection Inject 10 Units into the skin at bedtime.  6 mL  3  . isosorbide mononitrate (IMDUR) 30 MG 24 hr tablet Take 1 tablet (30 mg total) by mouth daily.  30 tablet  9  .  lisinopril (PRINIVIL,ZESTRIL) 2.5 MG tablet TAKE ONE TABLET BY MOUTH EVERY DAY  30 tablet  3  . metFORMIN (GLUCOPHAGE) 1000 MG tablet Take 1,000 mg by mouth 2 (two) times daily.      . metoprolol succinate (TOPROL-XL) 50 MG 24 hr tablet Take 1 tablet (50 mg total) by mouth daily.  30 tablet  12  . rosuvastatin (CRESTOR) 40 MG tablet Take 1 tablet (40 mg total) by mouth daily.  30 tablet  12  . silver sulfADIAZINE (SILVADENE) 1 % cream as directed.      . vitamin B-12 (CYANOCOBALAMIN) 1000 MCG tablet Take 1,000 mcg by mouth daily.         Past Medical History  Diagnosis Date  . Diabetes mellitus   . High cholesterol   . Diabetic retinopathy   . Peripheral neuropathy     "tips of toes"  . Blind right eye   . Nephrotic syndrome   . Lung mass   . Cardiomyopathy   . Hypertension   . CHF (congestive heart failure)   . Headache   . Cancer     Adenocarcinoma of lung  . CAD (coronary artery disease)     Past Surgical History  Procedure Date  . Cesarean section 1991; 1994  . Breast surgery   . Eye surgery   . Breast cyst incision and drainage ?1993    left  . Tubal ligation 1994  . Vitrectomy  bilaterally "right eye once; left eye twice"  . Cardiac catheterization   . Lobectomy April 2013    LLL, Dr. Morton Peters    History   Social History  . Marital Status: Married    Spouse Name: N/A    Number of Children: 3  . Years of Education: N/A   Occupational History  . Not on file.   Social History Main Topics  . Smoking status: Former Smoker -- 2.0 packs/day for 30 years    Types: Cigarettes    Quit date: 08/01/2011  . Smokeless tobacco: Never Used  . Alcohol Use: No  . Drug Use: Yes    Special: Marijuana     "last time for marijuana ~ 1993"  . Sexually Active: Yes   Other Topics Concern  . Not on file   Social History Narrative  . No narrative on file    ROS: no fevers or chills, productive cough, hemoptysis, dysphasia, odynophagia, melena, hematochezia,  dysuria, hematuria, rash, seizure activity, orthopnea, PND, pedal edema, claudication. Remaining systems are negative.  Physical Exam: Well-developed well-nourished in no acute distress.  Skin is warm and dry.  HEENT is normal.  Neck is supple.  Chest is clear to auscultation with normal expansion.  Cardiovascular exam is regular rate and rhythm.  Abdominal exam nontender or distended. No masses palpated. Extremities show no edema. neuro grossly intact  ECG sinus rhythm at a rate of 93. Left atrial enlargement. Nonspecific ST changes.

## 2012-01-21 NOTE — Assessment & Plan Note (Signed)
Continue ACE inhibitor and beta blocker. Repeat echocardiogram. 

## 2012-01-21 NOTE — Assessment & Plan Note (Signed)
She is having some dizziness in the morning after taking all of her medications. Discontinue Imdur. Change lisinopril to evening. Hopefully symptoms will improve with this. Her potassium and renal function are monitored by primary care.

## 2012-01-21 NOTE — Assessment & Plan Note (Signed)
Followed by nephrology. 

## 2012-01-26 ENCOUNTER — Ambulatory Visit: Payer: Medicare Other | Admitting: Cardiothoracic Surgery

## 2012-01-27 ENCOUNTER — Other Ambulatory Visit (HOSPITAL_COMMUNITY): Payer: Medicare Other

## 2012-01-28 ENCOUNTER — Telehealth: Payer: Self-pay | Admitting: Family Medicine

## 2012-01-28 MED ORDER — BENZONATATE 100 MG PO CAPS: 100.0000 mg | ORAL_CAPSULE | Freq: Three times a day (TID) | ORAL | Status: DC | PRN

## 2012-01-28 NOTE — Telephone Encounter (Signed)
Patient states that she has a sore throat, cough, no congestion but has been taking mucinex, highest temperature 99.0, body aches.  Symptoms began on 10/6.  Please advise.

## 2012-01-28 NOTE — Telephone Encounter (Signed)
Patient aware.

## 2012-01-28 NOTE — Telephone Encounter (Signed)
Please triage and schedule if needed.

## 2012-01-28 NOTE — Telephone Encounter (Signed)
Cough medicine sent, advise warm salt water gargles for sore throat Tylenol for body aches, plenty of rest and fluids  Continue mucinex If she does not improve come in for appt If she gets Shortness of breath or worse go to ER if needed

## 2012-02-04 ENCOUNTER — Ambulatory Visit (INDEPENDENT_AMBULATORY_CARE_PROVIDER_SITE_OTHER): Payer: Medicare Other | Admitting: Family Medicine

## 2012-02-04 ENCOUNTER — Encounter: Payer: Self-pay | Admitting: Family Medicine

## 2012-02-04 VITALS — BP 120/78 | HR 103 | Temp 98.0°F | Resp 17 | Ht 67.0 in | Wt 238.8 lb

## 2012-02-04 DIAGNOSIS — Z85118 Personal history of other malignant neoplasm of bronchus and lung: Secondary | ICD-10-CM

## 2012-02-04 DIAGNOSIS — J40 Bronchitis, not specified as acute or chronic: Secondary | ICD-10-CM

## 2012-02-04 DIAGNOSIS — E119 Type 2 diabetes mellitus without complications: Secondary | ICD-10-CM

## 2012-02-04 MED ORDER — ALBUTEROL SULFATE HFA 108 (90 BASE) MCG/ACT IN AERS: 2.0000 | INHALATION_SPRAY | RESPIRATORY_TRACT | Status: DC | PRN

## 2012-02-04 MED ORDER — IPRATROPIUM BROMIDE 0.02 % IN SOLN
0.5000 mg | Freq: Once | RESPIRATORY_TRACT | Status: AC
  Administered 2012-02-04: 0.5 mg via RESPIRATORY_TRACT

## 2012-02-04 MED ORDER — INSULIN GLARGINE 100 UNIT/ML ~~LOC~~ SOLN: 20.0000 [IU] | Freq: Every day | SUBCUTANEOUS | Status: DC

## 2012-02-04 MED ORDER — DOXYCYCLINE HYCLATE 100 MG PO TABS: 100.0000 mg | ORAL_TABLET | Freq: Two times a day (BID) | ORAL | Status: DC

## 2012-02-04 MED ORDER — PREDNISONE 10 MG PO TABS: ORAL_TABLET | ORAL | Status: DC

## 2012-02-04 MED ORDER — ALBUTEROL SULFATE (2.5 MG/3ML) 0.083% IN NEBU
2.5000 mg | INHALATION_SOLUTION | Freq: Once | RESPIRATORY_TRACT | Status: AC
  Administered 2012-02-04: 2.5 mg via RESPIRATORY_TRACT

## 2012-02-04 MED ORDER — METHYLPREDNISOLONE SODIUM SUCC 125 MG IJ SOLR
125.0000 mg | Freq: Once | INTRAMUSCULAR | Status: AC
  Administered 2012-02-04: 125 mg via INTRAMUSCULAR

## 2012-02-04 NOTE — Progress Notes (Signed)
  Subjective:    Patient ID: Hannah [name], female    DOB: 1971-01-16, 41 y.o.   MRN: [id number]  HPI  Patient presents with cough with congestion shortness of breath and wheezing for the past 2 weeks. She states she's been progressively getting more short of breath when she walks around because of her breathing. She has history of adenocarcinoma of the lung and is status post partial lobectomy. Denies sick contacts. She was seen by her cardiologist who has adjusted her medication secondary to dizziness CBG-have 200-300 fasting, taking Lantus 10 units  Review of Systems  GEN- denies fatigue, fever, weight loss,weakness, recent illness HEENT- denies eye drainage, change in vision, nasal discharge, CVS- denies chest pain, palpitations RESP- +SOB,+ cough, +wheeze ABD- denies N/V, change in stools, abd pain Neuro- denies headache, dizziness, syncope, seizure activity      Objective:   Physical Exam GEN- Mild dyspnea with talking,, alert and oriented x3, obese, not toxic appearing HEENT- PERRL, EOMI, non injected sclera, pink conjunctiva, MMM, oropharynx clear,  Neck- Supple, no retractions, no LAD CVS- mild resting tachycardic, no murmur, no JVD RESP-+ bronchospasm/wheeze bilateral, decreased air movement to bases, upper airway congestion no retractions, speaking in full sentences ABD-NABS,soft, NT,ND, EXT- 1+pedal, venous stasis changes Pulses- Radial 2+  Improved, s/p neb air movement, decreased wheeze,      Assessment & Plan:

## 2012-02-04 NOTE — Patient Instructions (Addendum)
Treating for bronchitis Albuterol inhaler Prednisone  Increase Lantus to 20 units  If you do not improve go to ER F/U 4 weeks for diabetes

## 2012-02-05 LAB — HEMOGLOBIN A1C: Hgb A1c MFr Bld: 11.9 % — ABNORMAL HIGH (ref <5.7)

## 2012-02-06 DIAGNOSIS — J40 Bronchitis, not specified as acute or chronic: Secondary | ICD-10-CM | POA: Insufficient documentation

## 2012-02-06 NOTE — Assessment & Plan Note (Signed)
Uncontrolled, she missed f/u appt, here for sick visit, will have get A1C before return visit, increase Lantus to 20 units, continue oral medications

## 2012-02-06 NOTE — Assessment & Plan Note (Addendum)
S/p neb, breathing much improved, given IM solumedrol, plan for prednisone burst,antibiotic , albuterol inhaler,

## 2012-02-09 ENCOUNTER — Ambulatory Visit
Admission: RE | Admit: 2012-02-09 | Discharge: 2012-02-09 | Disposition: A | Discharge status: Home / Self Care | Payer: Medicare Other | Source: Ambulatory Visit | Attending: Cardiothoracic Surgery | Admitting: Cardiothoracic Surgery

## 2012-02-09 ENCOUNTER — Ambulatory Visit (INDEPENDENT_AMBULATORY_CARE_PROVIDER_SITE_OTHER): Payer: Medicare Other | Admitting: Cardiothoracic Surgery

## 2012-02-09 ENCOUNTER — Encounter: Payer: Self-pay | Admitting: Cardiothoracic Surgery

## 2012-02-09 ENCOUNTER — Ambulatory Visit (HOSPITAL_COMMUNITY): Payer: Medicare Other | Attending: Cardiovascular Disease | Admitting: Radiology

## 2012-02-09 VITALS — BP 137/80 | HR 100 | Resp 16 | Ht 67.0 in | Wt 243.0 lb

## 2012-02-09 DIAGNOSIS — I2510 Atherosclerotic heart disease of native coronary artery without angina pectoris: Secondary | ICD-10-CM | POA: Insufficient documentation

## 2012-02-09 DIAGNOSIS — C3430 Malignant neoplasm of lower lobe, unspecified bronchus or lung: Secondary | ICD-10-CM

## 2012-02-09 DIAGNOSIS — I428 Other cardiomyopathies: Secondary | ICD-10-CM | POA: Insufficient documentation

## 2012-02-09 DIAGNOSIS — I059 Rheumatic mitral valve disease, unspecified: Secondary | ICD-10-CM | POA: Insufficient documentation

## 2012-02-09 DIAGNOSIS — C3490 Malignant neoplasm of unspecified part of unspecified bronchus or lung: Secondary | ICD-10-CM

## 2012-02-09 DIAGNOSIS — I369 Nonrheumatic tricuspid valve disorder, unspecified: Secondary | ICD-10-CM | POA: Insufficient documentation

## 2012-02-09 DIAGNOSIS — I379 Nonrheumatic pulmonary valve disorder, unspecified: Secondary | ICD-10-CM | POA: Insufficient documentation

## 2012-02-09 DIAGNOSIS — I429 Cardiomyopathy, unspecified: Secondary | ICD-10-CM

## 2012-02-09 DIAGNOSIS — Z09 Encounter for follow-up examination after completed treatment for conditions other than malignant neoplasm: Secondary | ICD-10-CM

## 2012-02-09 DIAGNOSIS — I10 Essential (primary) hypertension: Secondary | ICD-10-CM | POA: Insufficient documentation

## 2012-02-09 DIAGNOSIS — F17200 Nicotine dependence, unspecified, uncomplicated: Secondary | ICD-10-CM | POA: Insufficient documentation

## 2012-02-09 DIAGNOSIS — E119 Type 2 diabetes mellitus without complications: Secondary | ICD-10-CM | POA: Insufficient documentation

## 2012-02-09 NOTE — Progress Notes (Signed)
PCP is Milinda Antis, MD Referring Provider is Leslye Peer, MD  Chief Complaint  Patient presents with  . Routine Post Op    2 month f/u with cxr, s/p LLLOBECTOMY  07/30/11    HPI: 41 year old female reformed smoker status post left lower lobectomy for stage I adenocarcinoma lung returns for 6 month followup. She is smoke-free. She has no significant postthoracotomy pain. Her nephrotic syndrome is being carefully followed by Dr. Marlaine Hind. The patient did not require post resection chemotherapy. She returns for 6 month followup with chest x-ray. She was recently treated with a course of oral Zithromax and a prednisone taper for bronchitis.   Past Medical History  Diagnosis Date  . Diabetes mellitus   . High cholesterol   . Diabetic retinopathy(362.0)   . Peripheral neuropathy     "tips of toes"  . Blind right eye   . Nephrotic syndrome   . Lung mass   . Cardiomyopathy   . Hypertension   . CHF (congestive heart failure)   . Headache   . Cancer     Adenocarcinoma of lung  . CAD (coronary artery disease)     Past Surgical History  Procedure Date  . Cesarean section 1991; 1994  . Breast surgery   . Eye surgery   . Breast cyst incision and drainage ?1993    left  . Tubal ligation 1994  . Vitrectomy     bilaterally "right eye once; left eye twice"  . Cardiac catheterization   . Lobectomy April 2013    LLL, Dr. Morton Peters    Family History  Problem Relation Age of Onset  . Coronary artery disease Father   . Asthma Father   . COPD Father   . Hypertension Father   . Hyperlipidemia Father   . Diabetes Father   . Lung cancer Mother   . Hypertension Mother   . Hyperlipidemia Mother   . Diabetes Mother   . Cancer Mother     lung  . Anesthesia problems Neg Hx   . Hypotension Neg Hx   . Malignant hyperthermia Neg Hx   . Pseudochol deficiency Neg Hx     Social History History  Substance Use Topics  . Smoking status: Former Smoker -- 2.0 packs/day for 30 years     Types: Cigarettes    Quit date: 08/01/2011  . Smokeless tobacco: Never Used  . Alcohol Use: No    Current Outpatient Prescriptions  Medication Sig Dispense Refill  . albuterol (PROVENTIL HFA;VENTOLIN HFA) 108 (90 BASE) MCG/ACT inhaler Inhale 2 puffs into the lungs every 4 (four) hours as needed for wheezing.  1 Inhaler  3  . aspirin EC 81 MG tablet Take 81 mg by mouth daily.      . furosemide (LASIX) 40 MG tablet Take 1 tablet (40 mg total) by mouth 2 (two) times daily.  180 tablet  1  . glipiZIDE (GLUCOTROL) 10 MG tablet Take 1 tablet (10 mg total) by mouth 2 (two) times daily before a meal.  180 tablet  1  . hydrocortisone valerate ointment (WEST-CORT) 0.2 % Apply 1 application topically daily as needed. Apply only to Lower extremities at affected areas.  Wash hands well after application.      . insulin glargine (LANTUS) 100 UNIT/ML injection Inject 40 Units into the skin at bedtime.      Marland Kitchen lisinopril (PRINIVIL,ZESTRIL) 2.5 MG tablet TAKE ONE TABLET BY MOUTH EVERY DAY  30 tablet  3  . metFORMIN (GLUCOPHAGE)  1000 MG tablet Take 1,000 mg by mouth 2 (two) times daily.      . metoprolol succinate (TOPROL-XL) 50 MG 24 hr tablet Take 1 tablet (50 mg total) by mouth daily.  30 tablet  12  . rosuvastatin (CRESTOR) 40 MG tablet Take 1 tablet (40 mg total) by mouth daily.  30 tablet  12  . silver sulfADIAZINE (SILVADENE) 1 % cream as directed.      Marland Kitchen DISCONTD: insulin glargine (LANTUS SOLOSTAR) 100 UNIT/ML injection Inject 20 Units into the skin at bedtime.  5 pen  3    Allergies  Allergen Reactions  . Ciprofloxacin Rash    Review of Systems no weight loss no hemoptysis no chest pain no shortness of breath  BP 137/80  Pulse 100  Resp 16  Ht 5\' 7"  (1.702 m)  Wt 243 lb (110.224 kg)  BMI 38.06 kg/m2  SpO2 97% Physical Exam Alert and comfortable Neck without mass or JVD Breath sounds with scattered rhonchi Well-healed left thoracotomy scar Cardiac rhythm regular Neurologic  intact  Diagnostic Tests: P.m. a chest x-ray showed no evidence of new or recurrent thoracic malignancy. No pleural effusion.  Impression: Stable 6 months following left lower lobectomy for stage I adenocarcinoma lung  Plan: Return for one year annual exam with CT scan of chest April 2014. Due to her nephrotic syndrome we will not use IV contrast

## 2012-02-09 NOTE — Progress Notes (Signed)
Echocardiogram performed.  

## 2012-02-11 ENCOUNTER — Encounter: Payer: Self-pay | Admitting: *Deleted

## 2012-03-03 ENCOUNTER — Encounter: Payer: Self-pay | Admitting: Family Medicine

## 2012-03-03 ENCOUNTER — Ambulatory Visit (INDEPENDENT_AMBULATORY_CARE_PROVIDER_SITE_OTHER): Payer: Medicare Other | Admitting: Family Medicine

## 2012-03-03 VITALS — BP 122/82 | HR 92 | Resp 15 | Ht 63.0 in | Wt 253.4 lb

## 2012-03-03 DIAGNOSIS — R5383 Other fatigue: Secondary | ICD-10-CM

## 2012-03-03 DIAGNOSIS — R809 Proteinuria, unspecified: Secondary | ICD-10-CM

## 2012-03-03 DIAGNOSIS — N189 Chronic kidney disease, unspecified: Secondary | ICD-10-CM

## 2012-03-03 DIAGNOSIS — I872 Venous insufficiency (chronic) (peripheral): Secondary | ICD-10-CM

## 2012-03-03 DIAGNOSIS — R609 Edema, unspecified: Secondary | ICD-10-CM

## 2012-03-03 DIAGNOSIS — R12 Heartburn: Secondary | ICD-10-CM

## 2012-03-03 DIAGNOSIS — I8310 Varicose veins of unspecified lower extremity with inflammation: Secondary | ICD-10-CM

## 2012-03-03 DIAGNOSIS — R600 Localized edema: Secondary | ICD-10-CM

## 2012-03-03 DIAGNOSIS — E119 Type 2 diabetes mellitus without complications: Secondary | ICD-10-CM

## 2012-03-03 DIAGNOSIS — R601 Generalized edema: Secondary | ICD-10-CM

## 2012-03-03 MED ORDER — HYDROCORTISONE VALERATE 0.2 % EX OINT: TOPICAL_OINTMENT | CUTANEOUS | Status: DC

## 2012-03-03 MED ORDER — FUROSEMIDE 40 MG PO TABS: 80.0000 mg | ORAL_TABLET | Freq: Two times a day (BID) | ORAL | Status: DC

## 2012-03-03 MED ORDER — RANITIDINE HCL 150 MG PO TABS: 150.0000 mg | ORAL_TABLET | Freq: Every day | ORAL | Status: DC

## 2012-03-03 NOTE — Progress Notes (Signed)
  Subjective:    Patient ID: Hannah [name], female    DOB: April 14, 1971, 41 y.o.   MRN: [id number]  HPI  Pt here to f/u labs and breathing. S/p steroids, nebs and antibiotics for bronchitis, breathing improved, but she has gained 10lbs. DM- taking CBG fasting only, sugars have been 200-300, no hypoglycemia Belching a lot with gas, and sour taste in mouth and throat. Worse after eating certain foods Review of Systems  GEN-+ fatigue, fever, weight loss,weakness, recent illness HEENT- denies eye drainage, change in vision, nasal discharge, CVS- denies chest pain, palpitations, + swelling RESP- denies SOB, cough, wheeze ABD- denies N/V, change in stools, abd pain       Objective:   Physical Exam GEN- NAD alert and oriented x3, obese,BP taken on forearm, very difficult to hear HEENT- PERRL, EOMI, non injected sclera, pink conjunctiva, MMM, oropharynx clear,  Neck- Supple, no JVD CVS- RRR, no murmur, no JVD RESP-+CTAB  ABD-NABS,soft, NT,ND, bloated appearing EXT- 2+edema to thighs, venous stasis changes Pulses- Radial 2+       Assessment & Plan:

## 2012-03-03 NOTE — Assessment & Plan Note (Signed)
Topical steroid. 

## 2012-03-03 NOTE — Assessment & Plan Note (Signed)
Uncontrolled, with A1 11.9%, increase lantus to 25 units, pt to titrate based on fastings, 1 point if greater than 120 Check CBG Fasting and QAC, may need prandial insulin added, on oral meds

## 2012-03-03 NOTE — Assessment & Plan Note (Signed)
Zantac at bedtime

## 2012-03-03 NOTE — Patient Instructions (Addendum)
Increase lantus to 25 units , then increase by 1 unit every day until fastings around 120 or less  Get the labs done and I will send to kidney doctor  Zantac at bedtime  Increase lasix to 80mg  twice a day  I will call in 1 week to f/u your blood sugars Take fasting and 2 hours after meals  Cream for your leg  F/U Physical January 2014

## 2012-03-03 NOTE — Assessment & Plan Note (Signed)
Renal function and CBC to be checked today, will forward to neprhologist

## 2012-03-03 NOTE — Assessment & Plan Note (Signed)
Worsening edema , most likley caused by prednisone, increase lasix, will have her continues weights, no Cardiopulmonary compromise, call to check weights next week

## 2012-03-04 ENCOUNTER — Telehealth: Payer: Self-pay | Admitting: Family Medicine

## 2012-03-04 DIAGNOSIS — E875 Hyperkalemia: Secondary | ICD-10-CM

## 2012-03-04 LAB — RENAL FUNCTION PANEL
Albumin: 2 g/dL — ABNORMAL LOW (ref 3.5–5.2)
BUN: 23 mg/dL (ref 6–23)
CO2: 27 mEq/L (ref 19–32)
Calcium: 8.3 mg/dL — ABNORMAL LOW (ref 8.4–10.5)
Chloride: 103 mEq/L (ref 96–112)
Creat: 1.3 mg/dL — ABNORMAL HIGH (ref 0.50–1.10)
Glucose, Bld: 253 mg/dL — ABNORMAL HIGH (ref 70–99)
Phosphorus: 4.6 mg/dL (ref 2.3–4.6)
Potassium: 6 mEq/L — ABNORMAL HIGH (ref 3.5–5.3)
Sodium: 136 mEq/L (ref 135–145)

## 2012-03-04 LAB — CBC
HCT: 35.5 % — ABNORMAL LOW (ref 36.0–46.0)
Hemoglobin: 11.7 g/dL — ABNORMAL LOW (ref 12.0–15.0)
MCH: 28.8 pg (ref 26.0–34.0)
MCHC: 33 g/dL (ref 30.0–36.0)
MCV: 87.4 fL (ref 78.0–100.0)
Platelets: 399 10*3/uL (ref 150–400)
RBC: 4.06 MIL/uL (ref 3.87–5.11)
RDW: 13.9 % (ref 11.5–15.5)
WBC: 7.6 10*3/uL (ref 4.0–10.5)

## 2012-03-04 LAB — TSH: TSH: 1.551 u[IU]/mL (ref 0.350–4.500)

## 2012-03-04 NOTE — Telephone Encounter (Signed)
Spoke with pt potassium a little high at 6 she has had this problem before , no extra potassium supplements, she is on insulin and I have increased her diuretic so this should fall down to normal level. Will not send kayexlate at this time, recheck Monday.

## 2012-03-06 NOTE — Addendum Note (Signed)
Addended by: Kandis Fantasia B on: 03/06/2012 09:36 AM   Modules accepted: Orders

## 2012-03-06 NOTE — Telephone Encounter (Signed)
bmet ordered 

## 2012-03-06 NOTE — Telephone Encounter (Signed)
Called and reminded patient to go and have lab drawn.

## 2012-03-07 ENCOUNTER — Telehealth: Payer: Self-pay | Admitting: Family Medicine

## 2012-03-07 DIAGNOSIS — N289 Disorder of kidney and ureter, unspecified: Secondary | ICD-10-CM

## 2012-03-07 LAB — BASIC METABOLIC PANEL
Chloride: 105 mEq/L (ref 96–112)
Creat: 1.62 mg/dL — ABNORMAL HIGH (ref 0.50–1.10)
Sodium: 135 mEq/L (ref 135–145)

## 2012-03-07 NOTE — Telephone Encounter (Signed)
Spoke with pt, states fluid mostly in legs and ankles now, but has not gone down, weight unchanged at 253lb but feels she is peeing more with the lasix CR 1.6 on recheck yesterday potassium is normal, will have her continue 80mg  BID since she is starting to diurese. Recheck BMET Thursday , will call cardiology if needed then She will come by office for me to check her edema at that time

## 2012-03-09 ENCOUNTER — Encounter: Payer: Self-pay | Admitting: Family Medicine

## 2012-03-09 NOTE — Progress Notes (Signed)
  Subjective:    Patient ID: Hannah [name], female    DOB: June 24, 1970, 41 y.o.   MRN: [id number]  HPI    Review of Systems     Objective:   Physical Exam        Assessment & Plan:  Weight today 249lbs, no SOB Edema much improved BMET today  Will likley decrease back to regular dose lasix 80mg  AM, 40 mg PM tomorrow, hold ACEI tonight, BP on lower end

## 2012-03-10 LAB — BASIC METABOLIC PANEL
BUN: 46 mg/dL — ABNORMAL HIGH (ref 6–23)
CO2: 25 mEq/L (ref 19–32)
Chloride: 104 mEq/L (ref 96–112)
Potassium: 5.2 mEq/L (ref 3.5–5.3)

## 2012-04-03 ENCOUNTER — Other Ambulatory Visit: Payer: Self-pay | Admitting: Family Medicine

## 2012-05-05 ENCOUNTER — Ambulatory Visit (INDEPENDENT_AMBULATORY_CARE_PROVIDER_SITE_OTHER): Payer: Medicare Other | Admitting: Family Medicine

## 2012-05-05 ENCOUNTER — Other Ambulatory Visit (HOSPITAL_COMMUNITY)
Admission: RE | Admit: 2012-05-05 | Discharge: 2012-05-05 | Disposition: A | Discharge status: Home / Self Care | Payer: Medicare Other | Source: Ambulatory Visit | Attending: Family Medicine | Admitting: Family Medicine

## 2012-05-05 ENCOUNTER — Encounter: Payer: Self-pay | Admitting: Family Medicine

## 2012-05-05 VITALS — BP 118/78 | HR 100 | Resp 18 | Ht 63.0 in | Wt 250.0 lb

## 2012-05-05 DIAGNOSIS — E119 Type 2 diabetes mellitus without complications: Secondary | ICD-10-CM

## 2012-05-05 DIAGNOSIS — L0292 Furuncle, unspecified: Secondary | ICD-10-CM | POA: Insufficient documentation

## 2012-05-05 DIAGNOSIS — E1139 Type 2 diabetes mellitus with other diabetic ophthalmic complication: Secondary | ICD-10-CM

## 2012-05-05 DIAGNOSIS — Z1211 Encounter for screening for malignant neoplasm of colon: Secondary | ICD-10-CM

## 2012-05-05 DIAGNOSIS — Z1239 Encounter for other screening for malignant neoplasm of breast: Secondary | ICD-10-CM

## 2012-05-05 DIAGNOSIS — Z01419 Encounter for gynecological examination (general) (routine) without abnormal findings: Secondary | ICD-10-CM

## 2012-05-05 DIAGNOSIS — E11319 Type 2 diabetes mellitus with unspecified diabetic retinopathy without macular edema: Secondary | ICD-10-CM

## 2012-05-05 DIAGNOSIS — E1165 Type 2 diabetes mellitus with hyperglycemia: Secondary | ICD-10-CM

## 2012-05-05 DIAGNOSIS — E785 Hyperlipidemia, unspecified: Secondary | ICD-10-CM

## 2012-05-05 DIAGNOSIS — Z87891 Personal history of nicotine dependence: Secondary | ICD-10-CM

## 2012-05-05 DIAGNOSIS — IMO0002 Reserved for concepts with insufficient information to code with codable children: Secondary | ICD-10-CM

## 2012-05-05 DIAGNOSIS — F17201 Nicotine dependence, unspecified, in remission: Secondary | ICD-10-CM | POA: Insufficient documentation

## 2012-05-05 LAB — RENAL FUNCTION PANEL
CO2: 24 mEq/L (ref 19–32)
Glucose, Bld: 440 mg/dL — ABNORMAL HIGH (ref 70–99)
Potassium: 4.6 mEq/L (ref 3.5–5.3)
Sodium: 134 mEq/L — ABNORMAL LOW (ref 135–145)

## 2012-05-05 LAB — CBC
Hemoglobin: 11.6 g/dL — ABNORMAL LOW (ref 12.0–15.0)
MCH: 28.9 pg (ref 26.0–34.0)
MCV: 87.8 fL (ref 78.0–100.0)
RBC: 4.01 MIL/uL (ref 3.87–5.11)
WBC: 8.4 10*3/uL (ref 4.0–10.5)

## 2012-05-05 LAB — LIPID PANEL
HDL: 31 mg/dL — ABNORMAL LOW (ref >39)
Triglycerides: 464 mg/dL — ABNORMAL HIGH (ref <150)

## 2012-05-05 LAB — HEMOGLOBIN A1C: Hgb A1c MFr Bld: 11.7 % — ABNORMAL HIGH (ref <5.7)

## 2012-05-05 LAB — POC HEMOCCULT BLD/STL (OFFICE/1-CARD/DIAGNOSTIC): Fecal Occult Blood, POC: NEGATIVE

## 2012-05-05 MED ORDER — INSULIN DETEMIR 100 UNIT/ML ~~LOC~~ SOLN
20.0000 [IU] | Freq: Every day | SUBCUTANEOUS | Status: DC
Start: 2012-05-05 — End: 2012-07-10

## 2012-05-05 MED ORDER — SULFAMETHOXAZOLE-TRIMETHOPRIM 800-160 MG PO TABS: 1.0000 | ORAL_TABLET | Freq: Two times a day (BID) | ORAL | Status: AC

## 2012-05-05 MED ORDER — FLUCONAZOLE 150 MG PO TABS: 150.0000 mg | ORAL_TABLET | Freq: Once | ORAL | Status: DC

## 2012-05-05 MED ORDER — LISINOPRIL 2.5 MG PO TABS: 2.5000 mg | ORAL_TABLET | Freq: Every day | ORAL | Status: DC

## 2012-05-05 NOTE — Assessment & Plan Note (Addendum)
Pap smear and mammogram done. Patient states she has had an abnormal Pap smear a few years ago which cleared on its own and did not require any treatment. Mammogram to be set up Flu shot to be obtained from [facility] pharmacy

## 2012-05-05 NOTE — Progress Notes (Signed)
  Subjective:    Patient ID: Hannah [name], female    DOB: [birth year], 42 y.o.   MRN: [id number]  HPI Pt here for GYN exam, has a boil in left groin for past 3 days that started to drain.  Has not had a physical with Pap smear in greater than 3 years she's also due for Mammogram She stopped taking her Lantus because states it was causing diarrhea she actually went off her metformin for week to see if this caused diarrhea this did not she didn't stop her Lantus and noticed that it was a Lantus therefore has not been on insulin for the past 3 weeks. She states her diarrhea actually started when I began her on Lantus however she did not mention this. She was on Levemir the past and states this worked well for her like to be switched  She is due to f/u with Dr. Nile Riggs for vision, Renal- Dr. Abel Presto   Review of Systems  GEN- denies fatigue, fever, weight loss,weakness, recent illness HEENT- denies eye drainage, change in vision, nasal discharge, CVS- denies chest pain, palpitations RESP- denies SOB, cough, wheeze ABD- denies N/V, change in stools, abd pain GU- denies dysuria, hematuria, dribbling, incontinence MSK- denies joint pain, muscle aches, injury Neuro- denies headache, dizziness, syncope, seizure activity      Objective:   Physical Exam GEN- NAD, alert and oriented x3, obese HEENT- no vision right eye, left eye reactive, MMM, oropharynx clear Neck- supple, no thyromegaly CVS-RRR, no murmur RESP-CTAB, no wheeze, no rhonchi ABD-NABS,soft,NT,ND Breast- normal symmetry, no nipple inversion,no nipple drainage, no nodules or lumps felt Nodes- no axillary nodes GU- normal external genitalia, vaginal mucosa pink and moist, cervix visualized no growth, no blood form os, minimal thin clear discharge, no CMT, no ovarian masses, uterus normal size, normal urethra Rectum- normal tone, no external lesions, FOBT negative Ext- + pedal edema, pulse 2+        Assessment & Plan:

## 2012-05-05 NOTE — Assessment & Plan Note (Signed)
Antibiotics prescribed for the boil. She will also be given Diflucan and she is susceptible to yeast infections

## 2012-05-05 NOTE — Assessment & Plan Note (Signed)
Uncontrolled I'll have her get A1c done today with her fasting labs. She will be switched to Levemir flex pen 20 units daily

## 2012-05-05 NOTE — Patient Instructions (Addendum)
I recommend eye visit once a year- Call and schedule with Dr. Nile Riggs I recommend dental visit every 6 months Goal is to  Exercise 30 minutes 5 days a week We will send a letter with lab results  Bactrim for the boil, warm compresses throughout the day  Start Levemir 20 units at bedtime Get labs done fasting Get flu shot from pharmacy Call and schedule mammogram F/U 6 weeks for diabetes

## 2012-05-05 NOTE — Assessment & Plan Note (Signed)
She will followup with Dr. Nile Riggs for evaluation on her left eye which her vision is worsening

## 2012-05-05 NOTE — Assessment & Plan Note (Signed)
She's been tobacco free for 9 months

## 2012-05-09 ENCOUNTER — Telehealth: Payer: Self-pay | Admitting: Family Medicine

## 2012-05-09 MED ORDER — OMEGA-3-ACID ETHYL ESTERS 1 G PO CAPS: 1.0000 g | ORAL_CAPSULE | Freq: Two times a day (BID) | ORAL | Status: DC

## 2012-05-09 NOTE — Telephone Encounter (Signed)
Patient given lab results. She's to increase her Levemir to 22 units her cholesterol is very elevated and her LDL not be calculated because her triglycerides in the 400s this is likely due to her uncontrolled diabetes. She started [employer]. We will continue Crestor the current dose and I will add Levoxyl twice a day and concern about adding a fenofibrate because of Rh her renal problems and other medical problems she would be high risk for myopathy. Her Pap smear was normal

## 2012-05-10 ENCOUNTER — Telehealth: Payer: Self-pay | Admitting: Surgery

## 2012-05-10 NOTE — Telephone Encounter (Signed)
Left message for patient regarding [facility] decision to see pt with ultrasound. Gave appt date of 06/12/12 @ 10:30am

## 2012-05-10 NOTE — Telephone Encounter (Signed)
Message copied by Fredrich Birks on Wed May 10, 2012  9:09 AM ------      Message from: Nada Libman      Created: Tue May 09, 2012  9:55 PM      Regarding: RE: Referral       I can;t remember her, but just schedule her with a carotid duplex prior      ----- Message -----         From: Fredrich Birks         Sent: 05/08/2012  11:43 AM           To: Nada Libman, MD      Subject: Referral                                                 Dr Myra Gianotti,            Ms Jeanice Dempsey called in today asking for an appointment with you for her carotid stenosis. When I explained that we needed a referral she said that she had spoken with you when she was in the hospital and you had given her your card.            Do you remember this patient? I cannot find any documentation in EPIC. Is it ok to just schedule her to see you?            Thanks,            Annabelle Harman

## 2012-05-11 ENCOUNTER — Ambulatory Visit (HOSPITAL_COMMUNITY): Payer: Medicare Other

## 2012-05-11 ENCOUNTER — Other Ambulatory Visit: Payer: Self-pay

## 2012-05-11 ENCOUNTER — Encounter: Payer: Self-pay | Admitting: Surgery

## 2012-05-11 DIAGNOSIS — I6529 Occlusion and stenosis of unspecified carotid artery: Secondary | ICD-10-CM

## 2012-05-12 ENCOUNTER — Ambulatory Visit (HOSPITAL_COMMUNITY): Payer: Medicare Other

## 2012-05-29 ENCOUNTER — Ambulatory Visit (HOSPITAL_COMMUNITY)
Admission: RE | Admit: 2012-05-29 | Discharge: 2012-05-29 | Disposition: A | Discharge status: Home / Self Care | Payer: Medicare Other | Source: Ambulatory Visit | Attending: Family Medicine | Admitting: Family Medicine

## 2012-05-29 ENCOUNTER — Other Ambulatory Visit: Payer: Self-pay | Admitting: Family Medicine

## 2012-05-29 DIAGNOSIS — Z1231 Encounter for screening mammogram for malignant neoplasm of breast: Secondary | ICD-10-CM | POA: Insufficient documentation

## 2012-05-29 DIAGNOSIS — Z1239 Encounter for other screening for malignant neoplasm of breast: Secondary | ICD-10-CM

## 2012-05-30 ENCOUNTER — Other Ambulatory Visit: Payer: Self-pay | Admitting: Family Medicine

## 2012-05-30 DIAGNOSIS — R928 Other abnormal and inconclusive findings on diagnostic imaging of breast: Secondary | ICD-10-CM

## 2012-06-03 ENCOUNTER — Other Ambulatory Visit: Payer: Self-pay

## 2012-06-05 ENCOUNTER — Other Ambulatory Visit: Payer: Self-pay | Admitting: Family Medicine

## 2012-06-09 ENCOUNTER — Encounter: Payer: Self-pay | Admitting: Surgery

## 2012-06-12 ENCOUNTER — Encounter: Payer: Medicare Other | Admitting: Surgery

## 2012-06-12 ENCOUNTER — Other Ambulatory Visit: Payer: Medicare Other

## 2012-06-16 ENCOUNTER — Ambulatory Visit: Payer: Medicare Other | Admitting: Family Medicine

## 2012-06-17 DIAGNOSIS — R921 Mammographic calcification found on diagnostic imaging of breast: Secondary | ICD-10-CM

## 2012-06-17 HISTORY — DX: Mammographic calcification found on diagnostic imaging of breast: R92.1

## 2012-06-21 ENCOUNTER — Ambulatory Visit (HOSPITAL_COMMUNITY)
Admission: RE | Admit: 2012-06-21 | Discharge: 2012-06-21 | Disposition: A | Discharge status: Home / Self Care | Payer: Medicare Other | Source: Ambulatory Visit | Attending: Family Medicine | Admitting: Family Medicine

## 2012-06-21 ENCOUNTER — Other Ambulatory Visit: Payer: Self-pay | Admitting: Family Medicine

## 2012-06-21 ENCOUNTER — Encounter (HOSPITAL_COMMUNITY): Payer: Medicare Other

## 2012-06-21 DIAGNOSIS — R921 Mammographic calcification found on diagnostic imaging of breast: Secondary | ICD-10-CM

## 2012-06-21 DIAGNOSIS — R928 Other abnormal and inconclusive findings on diagnostic imaging of breast: Secondary | ICD-10-CM

## 2012-06-23 ENCOUNTER — Ambulatory Visit: Payer: Medicare Other | Admitting: Family Medicine

## 2012-06-26 ENCOUNTER — Ambulatory Visit
Admission: RE | Admit: 2012-06-26 | Discharge: 2012-06-26 | Disposition: A | Discharge status: Home / Self Care | Payer: Medicare Other | Source: Ambulatory Visit | Attending: Family Medicine | Admitting: Family Medicine

## 2012-06-30 ENCOUNTER — Encounter (INDEPENDENT_AMBULATORY_CARE_PROVIDER_SITE_OTHER): Payer: Self-pay | Admitting: Surgery

## 2012-06-30 ENCOUNTER — Ambulatory Visit (INDEPENDENT_AMBULATORY_CARE_PROVIDER_SITE_OTHER): Payer: Medicare Other | Admitting: Surgery

## 2012-06-30 VITALS — BP 120/72 | HR 95 | Temp 97.7°F | Resp 18 | Ht 67.0 in | Wt 244.6 lb

## 2012-06-30 DIAGNOSIS — R921 Mammographic calcification found on diagnostic imaging of breast: Secondary | ICD-10-CM | POA: Insufficient documentation

## 2012-06-30 DIAGNOSIS — R928 Other abnormal and inconclusive findings on diagnostic imaging of breast: Secondary | ICD-10-CM

## 2012-06-30 NOTE — Progress Notes (Signed)
Patient ID: Hannah [name], female   DOB: Jun 06, 1970, 42 y.o.   MRN: [id number]  Chief Complaint  Patient presents with  . New Evaluation    eval Lt br    HPI Hannah [name] is a 42 y.o. female.  The patient sent at the request of Dr.Lin 4 suspicious left breast microcalcifications detected on diagnostic mammography. These are not amenable to stereotactic core biopsy. Denies any history of breast pain, breast mass, or nipple discharge. 1 first-degree and breast cancer. She has a history of lung cancer status post surgical resection. HPI  Past Medical History  Diagnosis Date  . Diabetes mellitus   . High cholesterol   . Diabetic retinopathy(362.0)   . Peripheral neuropathy     "tips of toes"  . Blind right eye   . Nephrotic syndrome   . Lung mass   . Cardiomyopathy   . Hypertension   . CHF (congestive heart failure)   . Headache   . Cancer     Adenocarcinoma of lung  . CAD (coronary artery disease)   . Kidney disease   . GERD (gastroesophageal reflux disease)     Past Surgical History  Procedure Laterality Date  . Breast surgery    . Eye surgery    . Breast cyst incision and drainage  ?1993    left  . Tubal ligation  1994  . Vitrectomy      bilaterally "right eye once; left eye twice"  . Cardiac catheterization    . Lobectomy  April 2013    LLL, Dr. Morton Peters  . Cesarean section  1991; 1994    Family History  Problem Relation Age of Onset  . Coronary artery disease Father   . Asthma Father   . COPD Father   . Hypertension Father   . Hyperlipidemia Father   . Diabetes Father   . Congestive Heart Failure Father   . Lung cancer Mother   . Hypertension Mother   . Hyperlipidemia Mother   . Diabetes Mother   . Cancer Mother     lung  . Anesthesia problems Neg Hx   . Hypotension Neg Hx   . Malignant hyperthermia Neg Hx   . Pseudochol deficiency Neg Hx   . Diabetes Sister   . Diabetes Brother   . Hypertension Brother   . Cancer Maternal Aunt     Aunt     Social History History  Substance Use Topics  . Smoking status: Former Smoker -- 2.00 packs/day for 30 years    Types: Cigarettes    Quit date: 08/01/2011  . Smokeless tobacco: Never Used  . Alcohol Use: No    Allergies  Allergen Reactions  . Ciprofloxacin Rash    Current Outpatient Prescriptions  Medication Sig Dispense Refill  . aspirin EC 81 MG tablet Take 81 mg by mouth daily.      . furosemide (LASIX) 40 MG tablet Take by mouth. 80mg  in AM and 40mg  in pm      . glipiZIDE (GLUCOTROL) 10 MG tablet TAKE ONE TABLET BY MOUTH TWICE DAILY BEFORE A MEAL  180 tablet  1  . hydrocortisone valerate ointment (WESTCORT) 0.2 % Apply to affected area BID  45 g  1  . insulin detemir (LEVEMIR FLEXPEN) 100 UNIT/ML injection Inject 20 Units into the skin at bedtime.  15 mL  6  . lisinopril (PRINIVIL,ZESTRIL) 2.5 MG tablet Take 1 tablet (2.5 mg total) by mouth daily.  30 tablet  6  .  metFORMIN (GLUCOPHAGE) 1000 MG tablet TAKE ONE TABLET BY MOUTH TWICE DAILY  240 tablet  0  . metoprolol succinate (TOPROL-XL) 50 MG 24 hr tablet Take 1 tablet (50 mg total) by mouth daily.  30 tablet  12  . omega-3 acid ethyl esters (LOVAZA) 1 G capsule Take 1 capsule (1 g total) by mouth 2 (two) times daily.  60 capsule  3  . ranitidine (ZANTAC) 150 MG tablet Take 1 tablet (150 mg total) by mouth at bedtime.  30 tablet  3  . rosuvastatin (CRESTOR) 40 MG tablet Take 1 tablet (40 mg total) by mouth daily.  30 tablet  12   No current facility-administered medications for this visit.    Review of Systems Review of Systems  Constitutional: Positive for fatigue.  HENT: Negative.   Eyes: Negative.   Respiratory: Positive for shortness of breath.   Cardiovascular: Negative.   Gastrointestinal: Negative.   Endocrine: Negative.   Genitourinary: Negative.   Neurological: Negative.   Hematological: Negative.   Psychiatric/Behavioral: Negative.     Blood pressure 120/72, pulse 95, temperature 97.7 F (36.5 C),  temperature source Temporal, resp. rate 18, height 5\' 7"  (1.702 m), weight 244 lb 9.6 oz (110.95 kg).  Physical Exam Physical Exam  Constitutional: She is oriented to person, place, and time. She appears well-developed and well-nourished.  HENT:  Head: Normocephalic and atraumatic.  Eyes: EOM are normal. Pupils are equal, round, and reactive to light.  Neck: Normal range of motion. Neck supple.  Cardiovascular: Normal rate and regular rhythm.   Pulmonary/Chest: Effort normal. Right breast exhibits no inverted nipple, no mass, no nipple discharge, no skin change and no tenderness. Left breast exhibits no inverted nipple, no mass, no nipple discharge, no skin change and no tenderness.  Neurological: She is alert and oriented to person, place, and time.  Skin: Skin is warm and dry.  Psychiatric: She has a normal mood and affect. Her behavior is normal. Judgment and thought content normal.    Data Reviewed s *RADIOLOGY REPORT*  Clinical Data: Abnormal left screening mammogram  DIGITAL DIAGNOSTIC LEFT MAMMOGRAM WITH CAD  Comparison: 12/05/2008 from [facility]  Findings:  ACR Breast Density Category 3: The breast tissue is heterogeneously  dense.  Additional imaging of the the left breast shows a developing 7 mm  cluster of calcifications that vary in size and shape. Tissue  sampling is recommended.  Mammographic images were processed with CAD.  IMPRESSION:  Suspicious left breast calcifications. Tissue sampling is  recommended.  RECOMMENDATION:  Stereotactic biopsy will be scheduled at the patient's convenience.  I have discussed the findings and recommendations with the patient.  Results were also provided in writing at the conclusion of the  visit.  BI-RADS CATEGORY 4: Suspicious abnormality - biopsy should be  considered.    Assessment    Left breast suspicious microcalcifications 7 mm not amenable to stereotactic biopsy    Plan    Discussed observation  versus surgical excision. Patient would like to proceed with left breast needle localized lumpectomy. The procedure has been discussed with the patient. Alternatives to surgery have been discussed with the patient.  Risks of surgery include bleeding,  Infection,  Seroma formation, death,  and the need for further surgery.   The patient understands and wishes to proceed.       CORNETT,THOMAS A. 06/30/2012, 10:45 AM

## 2012-06-30 NOTE — Patient Instructions (Signed)
Lumpectomy, Breast Conserving Surgery A lumpectomy is breast surgery that removes only part of the breast. Another name used may be partial mastectomy. The amount removed varies. Make sure you understand how much of your breast will be removed. Reasons for a lumpectomy:  Any solid breast mass.  Grouped significant nodularity that may be confused with a solitary breast mass. Lumpectomy is the most common form of breast cancer surgery today. The surgeon removes the portion of your breast which contains the tumor (cancer). This is the lump. Some normal tissue around the lump is also removed to be sure that all the tumor has been removed.  If cancer cells are found in the margins where the breast tissue was removed, your surgeon will do more surgery to remove the remaining cancer tissue. This is called re-excision surgery. Radiation and/or chemotherapy treatments are often given following a lumpectomy to kill any cancer cells that could possibly remain.  REASONS YOU MAY NOT BE ABLE TO HAVE BREAST CONSERVING SURGERY:  The tumor is located in more than one place.  Your breast is small and the tumor is large so the breast would be disfigured.  The entire tumor removal is not successful with a lumpectomy.  You cannot commit to a full course of chemotherapy, radiation therapy or are pregnant and cannot have radiation.  You have previously had radiation to the breast to treat cancer. HOW A LUMPECTOMY IS PERFORMED If overnight nursing is not required following a biopsy, a lumpectomy can be performed as a same-day surgery. This can be done in a hospital, clinic, or surgical center. The anesthesia used will depend on your surgeon. They will discuss this with you. A general anesthetic keeps you sleeping through the procedure. LET YOUR CAREGIVERS KNOW ABOUT THE FOLLOWING:  Allergies  Medications taken including herbs, eye drops, over the counter medications, and creams.  Use of steroids (by mouth or  creams)  Previous problems with anesthetics or Novocaine.  Possibility of pregnancy, if this applies  History of blood clots (thrombophlebitis)  History of bleeding or blood problems.  Previous surgery  Other health problems BEFORE THE PROCEDURE You should be present one hour prior to your procedure unless directed otherwise.  AFTER THE PROCEDURE  After surgery, you will be taken to the recovery area where a nurse will watch and check your progress. Once you're awake, stable, and taking fluids well, barring other problems you will be allowed to go home.  Ice packs applied to your operative site may help with discomfort and keep the swelling down.  A small rubber drain may be placed in the breast for a couple of days to prevent a hematoma from developing in the breast.  A pressure dressing may be applied for 24 to 48 hours to prevent bleeding.  Keep the wound dry.  You may resume a normal diet and activities as directed. Avoid strenuous activities affecting the arm on the side of the biopsy site such as tennis, swimming, heavy lifting (more than 10 pounds) or pulling.  Bruising in the breast is normal following this procedure.  Wearing a bra - even to bed - may be more comfortable and also help keep the dressing on.  Change dressings as directed.  Only take over-the-counter or prescription medicines for pain, discomfort, or fever as directed by your caregiver. Call for your results as instructed by your surgeon. Remember it is your responsibility to get the results of your lumpectomy if your surgeon asked you to follow-up. Do not assume   everything is fine if you have not heard from your caregiver. SEEK MEDICAL CARE IF:   There is increased bleeding (more than a small spot) from the wound.  You notice redness, swelling, or increasing pain in the wound.  Pus is coming from wound.  An unexplained oral temperature above 102 F (38.9 C) develops.  You notice a foul smell  coming from the wound or dressing. SEEK IMMEDIATE MEDICAL CARE IF:   You develop a rash.  You have difficulty breathing.  You have any allergic problems. Document Released: 05/17/2006 Document Revised: 06/28/2011 Document Reviewed: 08/18/2006 Exit[employer] Patient Information 2013 [employer].  

## 2012-07-07 ENCOUNTER — Other Ambulatory Visit: Payer: Self-pay

## 2012-07-07 ENCOUNTER — Encounter (HOSPITAL_BASED_OUTPATIENT_CLINIC_OR_DEPARTMENT_OTHER): Payer: Self-pay | Admitting: *Deleted

## 2012-07-07 DIAGNOSIS — D381 Neoplasm of uncertain behavior of trachea, bronchus and lung: Secondary | ICD-10-CM

## 2012-07-10 ENCOUNTER — Encounter (HOSPITAL_BASED_OUTPATIENT_CLINIC_OR_DEPARTMENT_OTHER): Payer: Self-pay | Admitting: *Deleted

## 2012-07-10 ENCOUNTER — Ambulatory Visit (HOSPITAL_BASED_OUTPATIENT_CLINIC_OR_DEPARTMENT_OTHER)
Admission: RE | Admit: 2012-07-10 | Discharge: 2012-07-10 | Disposition: A | Discharge status: Home / Self Care | Payer: Medicare Other | Source: Ambulatory Visit | Attending: Surgery | Admitting: Surgery

## 2012-07-10 ENCOUNTER — Encounter (HOSPITAL_COMMUNITY): Payer: Self-pay | Admitting: Pharmacy Technician

## 2012-07-10 ENCOUNTER — Telehealth (INDEPENDENT_AMBULATORY_CARE_PROVIDER_SITE_OTHER): Payer: Self-pay | Admitting: General Surgery

## 2012-07-10 DIAGNOSIS — L97519 Non-pressure chronic ulcer of other part of right foot with unspecified severity: Secondary | ICD-10-CM

## 2012-07-10 HISTORY — DX: Personal history of other malignant neoplasm of bronchus and lung: Z85.118

## 2012-07-10 HISTORY — DX: Unspecified cataract: H26.9

## 2012-07-10 HISTORY — DX: Mammographic calcification found on diagnostic imaging of breast: R92.1

## 2012-07-10 HISTORY — DX: Non-pressure chronic ulcer of other part of right foot with unspecified severity: L97.519

## 2012-07-10 NOTE — Pre-Procedure Instructions (Addendum)
To come for CBC, diff, CMET Nephrology notes req. from Dr. Hadley Pen office

## 2012-07-10 NOTE — Pre-Procedure Instructions (Signed)
Hx. reviewed by Dr. Ivin Booty; pt. has too many medical problems to be done at an outpatient center.  Jade at [facility] notified.

## 2012-07-10 NOTE — Telephone Encounter (Signed)
Hannah [name] with [facility] called to state anesthesia evaluated patient and states she can not have surgery at the outpatient facility - that she has too many medical problems and needs to be done at the main hospital. I told her I would pass the message to Dr Luisa Hart, his assistant and our surgery schedulers. She is scheduled on 07/13/2012.

## 2012-07-12 ENCOUNTER — Telehealth (INDEPENDENT_AMBULATORY_CARE_PROVIDER_SITE_OTHER): Payer: Self-pay

## 2012-07-12 ENCOUNTER — Encounter (HOSPITAL_COMMUNITY): Payer: Self-pay | Admitting: Vascular Surgery

## 2012-07-12 ENCOUNTER — Encounter (HOSPITAL_COMMUNITY): Payer: Self-pay

## 2012-07-12 ENCOUNTER — Encounter (HOSPITAL_COMMUNITY)
Admission: RE | Admit: 2012-07-12 | Discharge: 2012-07-12 | Disposition: A | Discharge status: Home / Self Care | Payer: Medicare Other | Source: Ambulatory Visit | Attending: Surgery | Admitting: Surgery

## 2012-07-12 VITALS — BP 102/69 | HR 88 | Temp 98.4°F | Resp 20 | Ht 67.0 in | Wt 244.9 lb

## 2012-07-12 DIAGNOSIS — E1149 Type 2 diabetes mellitus with other diabetic neurological complication: Secondary | ICD-10-CM | POA: Insufficient documentation

## 2012-07-12 DIAGNOSIS — I509 Heart failure, unspecified: Secondary | ICD-10-CM | POA: Insufficient documentation

## 2012-07-12 DIAGNOSIS — E669 Obesity, unspecified: Secondary | ICD-10-CM | POA: Insufficient documentation

## 2012-07-12 DIAGNOSIS — I428 Other cardiomyopathies: Secondary | ICD-10-CM | POA: Insufficient documentation

## 2012-07-12 DIAGNOSIS — R921 Mammographic calcification found on diagnostic imaging of breast: Secondary | ICD-10-CM

## 2012-07-12 DIAGNOSIS — I129 Hypertensive chronic kidney disease with stage 1 through stage 4 chronic kidney disease, or unspecified chronic kidney disease: Secondary | ICD-10-CM | POA: Insufficient documentation

## 2012-07-12 DIAGNOSIS — H5440 Blindness, one eye, unspecified eye: Secondary | ICD-10-CM | POA: Insufficient documentation

## 2012-07-12 DIAGNOSIS — R928 Other abnormal and inconclusive findings on diagnostic imaging of breast: Secondary | ICD-10-CM | POA: Insufficient documentation

## 2012-07-12 DIAGNOSIS — Z85118 Personal history of other malignant neoplasm of bronchus and lung: Secondary | ICD-10-CM | POA: Insufficient documentation

## 2012-07-12 DIAGNOSIS — N189 Chronic kidney disease, unspecified: Secondary | ICD-10-CM | POA: Insufficient documentation

## 2012-07-12 DIAGNOSIS — K219 Gastro-esophageal reflux disease without esophagitis: Secondary | ICD-10-CM | POA: Insufficient documentation

## 2012-07-12 DIAGNOSIS — I6529 Occlusion and stenosis of unspecified carotid artery: Secondary | ICD-10-CM | POA: Insufficient documentation

## 2012-07-12 DIAGNOSIS — Z01812 Encounter for preprocedural laboratory examination: Secondary | ICD-10-CM | POA: Insufficient documentation

## 2012-07-12 LAB — COMPREHENSIVE METABOLIC PANEL
AST: 11 U/L (ref 0–37)
Albumin: 2.1 g/dL — ABNORMAL LOW (ref 3.5–5.2)
Calcium: 8.5 mg/dL (ref 8.4–10.5)
Chloride: 103 mEq/L (ref 96–112)
Creatinine, Ser: 1.83 mg/dL — ABNORMAL HIGH (ref 0.50–1.10)
Total Protein: 5.2 g/dL — ABNORMAL LOW (ref 6.0–8.3)

## 2012-07-12 LAB — CBC WITH DIFFERENTIAL/PLATELET
Basophils Absolute: 0.1 10*3/uL (ref 0.0–0.1)
Basophils Relative: 1 % (ref 0–1)
Eosinophils Absolute: 0.3 10*3/uL (ref 0.0–0.7)
Eosinophils Relative: 3 % (ref 0–5)
HCT: 34.4 % — ABNORMAL LOW (ref 36.0–46.0)
MCH: 29.7 pg (ref 26.0–34.0)
MCHC: 34.6 g/dL (ref 30.0–36.0)
Monocytes Absolute: 0.5 10*3/uL (ref 0.1–1.0)
Neutro Abs: 5.6 10*3/uL (ref 1.7–7.7)
RDW: 12.6 % (ref 11.5–15.5)

## 2012-07-12 LAB — SURGICAL PCR SCREEN: Staphylococcus aureus: NEGATIVE

## 2012-07-12 LAB — HCG, SERUM, QUALITATIVE: Preg, Serum: NEGATIVE

## 2012-07-12 MED ORDER — DEXTROSE 5 % IV SOLN: 3.0000 g | INTRAVENOUS | Status: AC

## 2012-07-12 NOTE — Pre-Procedure Instructions (Signed)
SCOTTLYNN LINDELL  07/12/2012   Your procedure is scheduled on:  Thursday, March 27th  Report to [facility] at  10:00 AM.  Call this number if you have problems the morning of surgery: [phone]   Remember:   Do not eat food or drink liquids after midnight Wednesday.   Take these medicines the morning of surgery with A SIP OF WATER: Metoprolol, Zantac   Do not wear jewelry, make-up or nail polish.  Do not wear lotions, powders, or perfumes. You may wear NOT deodorant.  Do not shave 48 hours prior to surgery.    Do not bring valuables to the hospital.  Contacts, dentures or bridgework may not be worn into surgery.   Leave suitcase in the car. After surgery it may be brought to your room.  For patients admitted to the hospital, checkout time is 11:00 AM the day of discharge.   Patients discharged the day of surgery will not be allowed to drive home.             Someone will need to stay with you for the first 24 hrs after surgery.   Name and phone number of your driver:   Special Instructions: Shower using CHG 2 nights before surgery and the night before surgery.  If you shower the day of surgery use CHG.  Use special wash - you have one bottle of CHG for all showers.  You should use approximately 1/3 of the bottle for each shower.   Please read over the following fact sheets that you were given: Pain Booklet, Coughing and Deep Breathing, MRSA Information and Surgical Site Infection Prevention

## 2012-07-12 NOTE — Telephone Encounter (Signed)
I called patient to let her know her glucose was 384 which is to high for surgery. I told her we will have to cancel surgery and she needs to see PCP and get it under control and stable. Once that happens she needs to call us and we can reschedule her.

## 2012-07-12 NOTE — Progress Notes (Addendum)
10:25  Pt has as the following MD's-- Crenshaw for cardio, Coladonato for kidneys, [facility]rum for pulmonary.  Dr. Donata Clay did her lung surgery.  She has been feeling great since she stopped smoking and the lung surgery.  DA Shonna Chock, PA will "meet & greet"...DA

## 2012-07-12 NOTE — Progress Notes (Signed)
Anesthesia Chart Review:  Patient is a 42 year old female scheduled for left breast lumpectomy with needle localization for left breast calcifications on 07/13/12 by Dr. Luisa Hart.  She was initially scheduled at [facility], but Anesthesiologist Dr. Ivin Booty felt she should be done at [facility] due to her multiple medical issues.   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 Abel Presto), HTN, GERD, CAD, cardiomyopathy, CHF, carotid occlusive disease. PCP is Dr. Milinda Antis.  She denies chest pain, SOB at rest, or significant edema (on Lasix).  She has chronic DOE with mild-moderate activity, which she feels in chronic.  She has a history of left eye visual disturbance last year, but none since at least March 2013.  She denies known history of CVA/TIA.  She reports her fasting glucose levels are up in the 300 range at times.  Exam shows a pleasant Caucasian female in NAD.  Heart RRR, no significant murmurs noted.  Lungs clear.  Left carotid bruit.  Mild pre-tibial edema.  Cardiologist is Dr. Jens Som, last visit 01/21/12.  EKG then showed NSR, LAE, non-specific ST/T wave abnormality.  In summary, she was admitted to [facility] in February 2013 for volume excess and echo showed an EF of 35-40%.  Myoview was performed on 07/14/11 that showed an ejection fraction of 49%. There was anterior and inferior ischemia. Cardiac catheterization was performed on 07/16/11. There was a 30% proximal LAD followed by a 50% mid and 40% distal. There was an 80% first diagonal. There were multiple 50-60% mid circumflex lesions. There was an 80% OM 1. The right coronary was occluded but filled via collaterals from the left. Ejection fraction 55%. The diagonal and OM were felt to be too small to stent and medical therapy was recommended.  Follow-up  echo on 02/09/12 showed: - Left ventricle: The cavity size was normal. Systolic function was normal. The estimated ejection fraction was in the range of 55% to 60%. Wall motion was normal; there were no regional wall motion abnormalities. - Mitral valve: Trivial regurgitation. - Left atrium: The atrium was mildly dilated. - Pulmonic valve: Trivial regurgitation. - Tricuspid valve: Trivial regurgitation. - Atrial septum: No defect or patent foramen ovale was identified.  Carotid duplex on 07/29/11 showed: Right internal carotid artery demonstrates mid to upper range 60-79% stenosis. Left internal carotid artery demonstrates greater than 80% stenosis. Bilateral antegrade vertebral artery flow.  (This was previously discussed with vascular surgeon Dr. Myra Gianotti with plans for follow-up after recovery from her VATS lobectomy.  She is scheduled to see him on 08/21/12.)  CXR on 02/09/12 showed no acute cardiopulmonary disease.  No evidence of recurrent carcinoma.  Preoperative labs noted.  Cr 1.83 (up from 1.54 on 05/05/12), non-fasting glucose 384.  H/H 11.9/34.4.  Serum pregnancy test is negative.  Patient is planning to take her full dose of Levemir this evening and hold oral DM meds tomorrow morning.  I have reviewed above history including carotid artery stenosis and uncontrolled DM with anesthesiologist Dr. Katrinka Blazing.  She has not had any TIA/CVA symptoms.  Patient has had cardiology follow-up within the past six month with echo showing improved EF.  She is not having any acute CV/CHF symptoms.  She will likely need insulin coverage for diabetes control perioperatively.  She will be evaluated by her assigned anesthesiologist, but based on above Dr. Katrinka Blazing felt  she could likely proceed as planned.  [name] [facility]/Anesthesiology Phone [phone] 07/12/2012 12:31 PM

## 2012-07-12 NOTE — Telephone Encounter (Signed)
erro

## 2012-07-13 ENCOUNTER — Encounter (HOSPITAL_BASED_OUTPATIENT_CLINIC_OR_DEPARTMENT_OTHER): Admission: RE | Payer: Self-pay | Source: Ambulatory Visit

## 2012-07-13 ENCOUNTER — Ambulatory Visit (HOSPITAL_BASED_OUTPATIENT_CLINIC_OR_DEPARTMENT_OTHER): Admission: RE | Admit: 2012-07-13 | Payer: Medicare Other | Source: Ambulatory Visit | Admitting: Surgery

## 2012-07-13 HISTORY — DX: Nephrotic syndrome with unspecified morphologic changes: N04.9

## 2012-07-13 SURGERY — BREAST LUMPECTOMY WITH NEEDLE LOCALIZATION
Anesthesia: General | Site: Breast | Laterality: Left

## 2012-07-13 SURGERY — BREAST LUMPECTOMY WITH NEEDLE LOCALIZATION
Anesthesia: General | Laterality: Left

## 2012-07-20 ENCOUNTER — Ambulatory Visit: Payer: Medicare Other | Admitting: Family Medicine

## 2012-07-21 ENCOUNTER — Encounter: Payer: Self-pay | Admitting: Family Medicine

## 2012-07-21 ENCOUNTER — Ambulatory Visit (INDEPENDENT_AMBULATORY_CARE_PROVIDER_SITE_OTHER): Payer: Medicare Other | Admitting: Family Medicine

## 2012-07-21 VITALS — BP 82/60 | HR 103 | Resp 16 | Wt 244.1 lb

## 2012-07-21 DIAGNOSIS — E119 Type 2 diabetes mellitus without complications: Secondary | ICD-10-CM

## 2012-07-21 DIAGNOSIS — N184 Chronic kidney disease, stage 4 (severe): Secondary | ICD-10-CM

## 2012-07-21 DIAGNOSIS — K3184 Gastroparesis: Secondary | ICD-10-CM | POA: Insufficient documentation

## 2012-07-21 DIAGNOSIS — I959 Hypotension, unspecified: Secondary | ICD-10-CM

## 2012-07-21 DIAGNOSIS — R197 Diarrhea, unspecified: Secondary | ICD-10-CM

## 2012-07-21 LAB — BASIC METABOLIC PANEL
Chloride: 106 mEq/L (ref 96–112)
Potassium: 4.5 mEq/L (ref 3.5–5.3)
Sodium: 137 mEq/L (ref 135–145)

## 2012-07-21 MED ORDER — METOPROLOL SUCCINATE ER 25 MG PO TB24: 25.0000 mg | ORAL_TABLET | Freq: Every day | ORAL | Status: DC

## 2012-07-21 MED ORDER — INSULIN ASPART 100 UNIT/ML FLEXPEN: SUBCUTANEOUS | Status: DC

## 2012-07-21 NOTE — Assessment & Plan Note (Addendum)
Concern for gastroparesis, uncontrolled DM, N/V, pain with eating at times Will refer to GI for evaluation Continue to hold aspirin Will not add anything else at this time Intentional weight loss

## 2012-07-21 NOTE — Assessment & Plan Note (Signed)
Unchanged with stopping lantus, D/C metformin Stool studies to be done, will have her see GI as well, concern for gastroparesis too

## 2012-07-21 NOTE — Patient Instructions (Signed)
Stop the metformin and Glipizide  Start novolog 5 units with each meal Increase Levemir to 40 units Stool studies Referral to GI   Decrease toprol to 25mg   Get kidney lab done today F/U 4 weeks

## 2012-07-21 NOTE — Assessment & Plan Note (Signed)
Uncontrolled, D/C Metformin and Glipizide due to diarrhea and CKD Increase levemir to 40 units add Novolog 5 units QAC RTC 4 weeks with meter and log ACEI discontinued due to CKD by neprhology

## 2012-07-21 NOTE — Assessment & Plan Note (Addendum)
Her blood pressure is very difficult to read in the office. I will have her decrease her beta blocker 25 mg daily her lisinopril was already stopped she is to call her cardiologist

## 2012-07-21 NOTE — Progress Notes (Signed)
  Subjective:    Patient ID: Hannah [name], female    DOB: [birth year], 42 y.o.   MRN: [id number]  HPI  Patient presents to follow chronic medical problems. She was seen by her nephrologist labs reviewed concern for worsening kidney disease. Her blood sugars fasting have been 200s postmeal they have been 300-400. She's currently taking Levemir 30 units. Her breast biopsy has been put off secondary to severely elevated blood sugar She's been having nausea , upset stomach with many meals she stopped her aspirin because this was your irritating her stomach as well. Her diarrhea persist even though she went off the Lantus. Her blood pressures have been running very well. Her blood pressures at home are 80 systolic over 60s diastolic. Her lisinopril was stopped by her kidney Dr. last week secondary to hypotension  Review of Systems  GEN- denies fatigue, fever, weight loss,weakness, recent illness HEENT- denies eye drainage, change in vision, nasal discharge, CVS- denies chest pain, palpitations RESP- denies SOB, cough, wheeze ABD- + N/V, +change in stools, abd pain GU- denies dysuria, hematuria, dribbling, incontinence MSK- denies joint pain, muscle aches, injury Neuro- denies headache, dizziness, syncope, seizure activity      Objective:   Physical Exam GEN- NAD, alert and oriented x3, obese HEENT- no vision right eye, left eye reactive, MMM, oropharynx clear Neck- supple,  CVS-RRR, no murmur RESP-CTAB, ABD-NABS,soft,NT,ND Ext- no edema, pulse 2+ Feet- Chronic RIght great toe ulcer, minimal drainge, no swelling, no foul odor        Assessment & Plan:

## 2012-07-27 ENCOUNTER — Other Ambulatory Visit: Payer: Self-pay | Admitting: Family Medicine

## 2012-07-28 ENCOUNTER — Emergency Department (HOSPITAL_COMMUNITY)
Admission: EM | Admit: 2012-07-28 | Discharge: 2012-07-28 | Disposition: A | Discharge status: Home / Self Care | Payer: Medicare Other | Attending: Emergency Medicine | Admitting: Emergency Medicine

## 2012-07-28 ENCOUNTER — Encounter (HOSPITAL_COMMUNITY): Payer: Self-pay | Admitting: *Deleted

## 2012-07-28 ENCOUNTER — Encounter (INDEPENDENT_AMBULATORY_CARE_PROVIDER_SITE_OTHER): Payer: Medicare Other | Admitting: Surgery

## 2012-07-28 ENCOUNTER — Emergency Department (HOSPITAL_COMMUNITY): Payer: Medicare Other

## 2012-07-28 DIAGNOSIS — R042 Hemoptysis: Secondary | ICD-10-CM

## 2012-07-28 DIAGNOSIS — I428 Other cardiomyopathies: Secondary | ICD-10-CM | POA: Insufficient documentation

## 2012-07-28 DIAGNOSIS — G609 Hereditary and idiopathic neuropathy, unspecified: Secondary | ICD-10-CM | POA: Insufficient documentation

## 2012-07-28 DIAGNOSIS — I2510 Atherosclerotic heart disease of native coronary artery without angina pectoris: Secondary | ICD-10-CM | POA: Insufficient documentation

## 2012-07-28 DIAGNOSIS — I509 Heart failure, unspecified: Secondary | ICD-10-CM | POA: Insufficient documentation

## 2012-07-28 DIAGNOSIS — Z79899 Other long term (current) drug therapy: Secondary | ICD-10-CM | POA: Insufficient documentation

## 2012-07-28 DIAGNOSIS — K219 Gastro-esophageal reflux disease without esophagitis: Secondary | ICD-10-CM | POA: Insufficient documentation

## 2012-07-28 DIAGNOSIS — J189 Pneumonia, unspecified organism: Secondary | ICD-10-CM

## 2012-07-28 DIAGNOSIS — Z85118 Personal history of other malignant neoplasm of bronchus and lung: Secondary | ICD-10-CM | POA: Insufficient documentation

## 2012-07-28 DIAGNOSIS — Z87891 Personal history of nicotine dependence: Secondary | ICD-10-CM | POA: Insufficient documentation

## 2012-07-28 DIAGNOSIS — R0602 Shortness of breath: Secondary | ICD-10-CM | POA: Insufficient documentation

## 2012-07-28 DIAGNOSIS — Z7982 Long term (current) use of aspirin: Secondary | ICD-10-CM | POA: Insufficient documentation

## 2012-07-28 DIAGNOSIS — J3489 Other specified disorders of nose and nasal sinuses: Secondary | ICD-10-CM | POA: Insufficient documentation

## 2012-07-28 DIAGNOSIS — L97509 Non-pressure chronic ulcer of other part of unspecified foot with unspecified severity: Secondary | ICD-10-CM | POA: Insufficient documentation

## 2012-07-28 DIAGNOSIS — E1139 Type 2 diabetes mellitus with other diabetic ophthalmic complication: Secondary | ICD-10-CM | POA: Insufficient documentation

## 2012-07-28 DIAGNOSIS — E11319 Type 2 diabetes mellitus with unspecified diabetic retinopathy without macular edema: Secondary | ICD-10-CM | POA: Insufficient documentation

## 2012-07-28 DIAGNOSIS — E1169 Type 2 diabetes mellitus with other specified complication: Secondary | ICD-10-CM | POA: Insufficient documentation

## 2012-07-28 DIAGNOSIS — H5440 Blindness, one eye, unspecified eye: Secondary | ICD-10-CM | POA: Insufficient documentation

## 2012-07-28 DIAGNOSIS — H269 Unspecified cataract: Secondary | ICD-10-CM | POA: Insufficient documentation

## 2012-07-28 DIAGNOSIS — I10 Essential (primary) hypertension: Secondary | ICD-10-CM | POA: Insufficient documentation

## 2012-07-28 DIAGNOSIS — R928 Other abnormal and inconclusive findings on diagnostic imaging of breast: Secondary | ICD-10-CM | POA: Insufficient documentation

## 2012-07-28 DIAGNOSIS — E7800 Pure hypercholesterolemia, unspecified: Secondary | ICD-10-CM | POA: Insufficient documentation

## 2012-07-28 LAB — FECAL OCCULT BLOOD, IMMUNOCHEMICAL: Fecal Occult Blood: NEGATIVE

## 2012-07-28 LAB — FECAL LACTOFERRIN, QUANT: Lactoferrin: NEGATIVE

## 2012-07-28 MED ORDER — AZITHROMYCIN 250 MG PO TABS: ORAL_TABLET | ORAL | Status: DC

## 2012-07-28 MED ORDER — CEFTRIAXONE SODIUM 1 G IJ SOLR
1.0000 g | Freq: Once | INTRAMUSCULAR | Status: AC
  Administered 2012-07-28: 1 g via INTRAMUSCULAR
  Filled 2012-07-28: qty 10

## 2012-07-28 MED ORDER — AZITHROMYCIN 250 MG PO TABS
500.0000 mg | ORAL_TABLET | Freq: Once | ORAL | Status: AC
  Administered 2012-07-28: 500 mg via ORAL
  Filled 2012-07-28: qty 2

## 2012-07-28 MED ORDER — ALBUTEROL SULFATE (5 MG/ML) 0.5% IN NEBU
5.0000 mg | INHALATION_SOLUTION | Freq: Once | RESPIRATORY_TRACT | Status: AC
  Administered 2012-07-28: 5 mg via RESPIRATORY_TRACT
  Filled 2012-07-28: qty 1

## 2012-07-28 MED ORDER — LIDOCAINE HCL (PF) 1 % IJ SOLN
INTRAMUSCULAR | Status: AC
  Administered 2012-07-28: 2.1 mL
  Filled 2012-07-28: qty 5

## 2012-07-28 NOTE — ED Notes (Signed)
Waiting 15 minute med time.

## 2012-07-28 NOTE — ED Notes (Signed)
C/o cough x 3 days; reports coughed up a blood clot this morning; reports productive cough since then but no blood.

## 2012-07-28 NOTE — ED Provider Notes (Signed)
History     CSN: [id number]  Arrival date & time 07/28/12  1126   First MD Initiated Contact with Patient 07/28/12 1133      Chief Complaint  Patient presents with  . Cough     Patient is a 42 y.o. female presenting with cough. The history is provided by the patient.  Cough Cough characteristics:  Productive Severity:  Moderate Onset quality:  Gradual Duration:  3 days Timing:  Intermittent Progression:  Worsening Chronicity:  New Smoker: former smoker.   Relieved by:  Nothing Worsened by:  Nothing tried Associated symptoms: shortness of breath, sinus congestion and sore throat   Associated symptoms: no chest pain and no fever   pt reports recent cough/congestion and sore throat.  She reports only mild SOB but no orthopnea or dyspnea on exertion She reports she had productive yellowish sputum this week.  This morning she cough up small blood clot.  None since.  She has no other complaints H/o lung CA but resolved with surgery and not on active chemo  Past Medical History  Diagnosis Date  . High cholesterol   . Diabetic retinopathy(362.0)   . Peripheral neuropathy     "tips of toes"  . Blind right eye   . Cardiomyopathy   . CHF (congestive heart failure)   . Cancer     Adenocarcinoma of lung  . CAD (coronary artery disease)   . GERD (gastroesophageal reflux disease)   . Hypertension     under control with med., has been on med. x 1 yr.  . History of lung cancer 07/2011    s/p left lower lobectomy  . Diabetes mellitus     IDDM  . Runny nose 07/10/2012    clear drainage  . Cataract of right eye   . Ulcer of toe of right foot 07/10/2012    great toe  . Breast calcification, left 06/2012  . Nephrotic syndrome   . Nephrotic range proteinuria     Past Surgical History  Procedure Laterality Date  . Cardiac catheterization  07/16/2011  . Incision and drainage breast abscess Left   . Tubal ligation  1994  . Vitrectomy  2010    2 on left, 1 on right  . Cesarean  section  1991; 1994  . Video assisted thoracoscopy (vats)/ lobectomy Left 07/30/2011    left main thoracotomy, left lower lobectomy, mediastinal lymph node dissection    Family History  Problem Relation Age of Onset  . Coronary artery disease Father   . Asthma Father   . COPD Father   . Hypertension Father   . Hyperlipidemia Father   . Diabetes Father   . Congestive Heart Failure Father   . Lung cancer Mother   . Hypertension Mother   . Hyperlipidemia Mother   . Diabetes Mother   . Lung cancer Mother   . Cancer Maternal Aunt   . Hypertension Brother   . Diabetes Brother   . Diabetes Sister     History  Substance Use Topics  . Smoking status: Former Smoker -- 2.00 packs/day for 30 years    Quit date: 08/01/2011  . Smokeless tobacco: Never Used  . Alcohol Use: No    OB History   Grav Para Term Preterm Abortions TAB SAB Ect Mult Living                  Review of Systems  Constitutional: Negative for fever.  HENT: Positive for congestion and sore throat. Negative for  nosebleeds.   Respiratory: Positive for cough and shortness of breath.   Cardiovascular: Negative for chest pain and leg swelling.  Neurological: Negative for weakness.  All other systems reviewed and are negative.    Allergies  Ciprofloxacin  Home Medications   Current Outpatient Rx  Name  Route  Sig  Dispense  Refill  . aspirin EC 81 MG tablet   Oral   Take 81 mg by mouth daily.         . furosemide (LASIX) 40 MG tablet   Oral   Take 40-80 mg by mouth 2 (two) times daily. 80mg  in AM and 40mg  in pm         . furosemide (LASIX) 40 MG tablet      TAKE TWO TABLETS BY MOUTH TWICE DAILY **DOSE CHANGE**   120 tablet   0   . insulin aspart (NOVOLOG) 100 unit/ml SOLN      Inject 5 units as directed with each meal   15 mL   3   . insulin detemir (LEVEMIR) 100 UNIT/ML injection   Subcutaneous   Inject 30 Units into the skin at bedtime.         . metoprolol succinate (TOPROL XL) 25  MG 24 hr tablet   Oral   Take 1 tablet (25 mg total) by mouth daily.   30 tablet   3   . omega-3 acid ethyl esters (LOVAZA) 1 G capsule   Oral   Take 1 capsule (1 g total) by mouth 2 (two) times daily.   60 capsule   3   . ranitidine (ZANTAC) 150 MG tablet   Oral   Take 1 tablet (150 mg total) by mouth at bedtime.   30 tablet   3   . rosuvastatin (CRESTOR) 40 MG tablet   Oral   Take 1 tablet (40 mg total) by mouth daily.   30 tablet   12     BP 128/63  Pulse 105  Resp 20  Ht 5\' 7"  (1.702 m)  Wt 244 lb (110.678 kg)  BMI 38.21 kg/m2  SpO2 97%  LMP 07/18/2012  Physical Exam CONSTITUTIONAL: Well developed/well nourished HEAD: Normocephalic/atraumatic EYES: EOMI/PERRL ENMT: Mucous membranes moist, no dried blood in nares NECK: supple no meningeal signs SPINE:entire spine nontender CV: S1/S2 noted, no murmurs/rubs/gallops noted LUNGS:coarse BS noted bilaterally.  No distress is noted ABDOMEN: soft, nontender, no rebound or guarding GU:no cva tenderness NEURO: Pt is awake/alert, moves all extremitiesx4 EXTREMITIES: pulses normal, full ROM SKIN: warm, color normal PSYCH: no abnormalities of mood noted  ED Course  Procedures   12:09 PM Pt just had labs drawn by PCP recently Will start with CXR Pt has history of left lower lobectomy for stage I adenocarcinoma lung  She is currently stable   Pt improved, ambulatory, no distress, no dyspnea or hypoxia Spoke to her PCP dr Jeanice Lim Will treat for CAP with rocephin/azithro and she will see her Monday Pt reports she already has CT chest ordered in the next week for CA followup Stable for d/c  MDM  Nursing notes including past medical history and social history reviewed and considered in documentation Previous records reviewed and considered xrays reviewed and considered         Joya Gaskins, MD 07/28/12 1603

## 2012-07-31 ENCOUNTER — Other Ambulatory Visit: Payer: Self-pay | Admitting: *Deleted

## 2012-07-31 ENCOUNTER — Ambulatory Visit (INDEPENDENT_AMBULATORY_CARE_PROVIDER_SITE_OTHER): Payer: Medicare Other | Admitting: Family Medicine

## 2012-07-31 ENCOUNTER — Other Ambulatory Visit: Payer: Self-pay

## 2012-07-31 ENCOUNTER — Encounter: Payer: Self-pay | Admitting: Family Medicine

## 2012-07-31 VITALS — BP 122/74 | HR 76 | Resp 18 | Ht 63.0 in | Wt 251.1 lb

## 2012-07-31 DIAGNOSIS — I2510 Atherosclerotic heart disease of native coronary artery without angina pectoris: Secondary | ICD-10-CM

## 2012-07-31 DIAGNOSIS — E1165 Type 2 diabetes mellitus with hyperglycemia: Secondary | ICD-10-CM

## 2012-07-31 DIAGNOSIS — E118 Type 2 diabetes mellitus with unspecified complications: Secondary | ICD-10-CM

## 2012-07-31 DIAGNOSIS — I872 Venous insufficiency (chronic) (peripheral): Secondary | ICD-10-CM

## 2012-07-31 DIAGNOSIS — J189 Pneumonia, unspecified organism: Secondary | ICD-10-CM

## 2012-07-31 DIAGNOSIS — IMO0002 Reserved for concepts with insufficient information to code with codable children: Secondary | ICD-10-CM

## 2012-07-31 DIAGNOSIS — E119 Type 2 diabetes mellitus without complications: Secondary | ICD-10-CM

## 2012-07-31 LAB — STOOL CULTURE

## 2012-07-31 MED ORDER — INSULIN LISPRO 100 UNIT/ML ~~LOC~~ SOLN: SUBCUTANEOUS | Status: DC

## 2012-07-31 NOTE — Assessment & Plan Note (Signed)
Appears well, will have CT scan in 48 hours to evaluate chest Complete antibiotics

## 2012-07-31 NOTE — Progress Notes (Signed)
  Subjective:    Patient ID: Hannah [name], female    DOB: 02-18-1971, 42 y.o.   MRN: [id number]  HPI  Pt here to f/u ER visit, seen in ER due to cough with blood streaked sputum, has not had any since ER visit, CXR showed probable new infiltrate, has CT of chest set up for Wed. Denies SOB, cough improved, given Rocephin injection and Pneumonia dose Azithromycin. Denies fever, N/V CBG have improved with change in insulin less than 200 fasting, unable to get the novolog.  Diarrhea has also resolved Review of Systems  GEN- denies fatigue, fever, weight loss,weakness, recent illness HEENT- denies eye drainage, change in vision, nasal discharge, CVS- denies chest pain, palpitations RESP- denies SOB, +cough, wheeze ABD- denies N/V, change in stools, abd pain Neuro- denies headache, dizziness, syncope, seizure activity      Objective:   Physical Exam GEN- NAD, alert and oriented x3, obese HEENT- no vision right eye, left eye reactive, MMM, oropharynx clear Neck- supple,  CVS-RRR, no murmur RESP-CTAB, ABD-NABS,soft,NT,ND Ext- no edema, pulse 2+       Assessment & Plan:

## 2012-07-31 NOTE — Assessment & Plan Note (Signed)
Restart lasix

## 2012-07-31 NOTE — Patient Instructions (Signed)
Ct of chest on Wed  Complete antibiotics We will about the insulin  Keep previous follow-up

## 2012-07-31 NOTE — Assessment & Plan Note (Addendum)
CBG improving, will call to get her HUmalog covered A1C before next visit

## 2012-08-01 ENCOUNTER — Other Ambulatory Visit: Payer: Self-pay | Admitting: Family Medicine

## 2012-08-02 ENCOUNTER — Encounter: Payer: Self-pay | Admitting: Cardiothoracic Surgery

## 2012-08-02 ENCOUNTER — Ambulatory Visit
Admission: RE | Admit: 2012-08-02 | Discharge: 2012-08-02 | Disposition: A | Discharge status: Home / Self Care | Payer: Medicare Other | Source: Ambulatory Visit | Attending: Cardiothoracic Surgery | Admitting: Cardiothoracic Surgery

## 2012-08-02 ENCOUNTER — Ambulatory Visit (INDEPENDENT_AMBULATORY_CARE_PROVIDER_SITE_OTHER): Payer: Medicare Other | Admitting: Cardiothoracic Surgery

## 2012-08-02 VITALS — BP 122/58 | HR 58 | Resp 20 | Ht 67.0 in | Wt 251.0 lb

## 2012-08-02 DIAGNOSIS — Z902 Acquired absence of lung [part of]: Secondary | ICD-10-CM

## 2012-08-02 DIAGNOSIS — Z9889 Other specified postprocedural states: Secondary | ICD-10-CM

## 2012-08-02 DIAGNOSIS — D381 Neoplasm of uncertain behavior of trachea, bronchus and lung: Secondary | ICD-10-CM

## 2012-08-02 DIAGNOSIS — Z85118 Personal history of other malignant neoplasm of bronchus and lung: Secondary | ICD-10-CM

## 2012-08-02 NOTE — Progress Notes (Signed)
PCP is Hannah Antis, MD Referring Provider is Leslye Peer, MD  Chief Complaint  Patient presents with  . 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 adenocarcinoma. Patient did not receive postop chemotherapy radiation. She is doing well. She is a nonsmoker. No significant postthoracotomy pain. No pulmonary symptoms.  Past Medical History  Diagnosis Date  . High cholesterol   . Diabetic retinopathy(362.0)   . Peripheral neuropathy     "tips of toes"  . Blind right eye   . Cardiomyopathy   . CHF (congestive heart failure)   . Cancer     Adenocarcinoma of lung  . CAD (coronary artery disease)   . GERD (gastroesophageal reflux disease)   . Hypertension     under control with med., has been on med. x 1 yr.  . History of lung cancer 07/2011    s/p left lower lobectomy  . Diabetes mellitus     IDDM  . Runny nose 07/10/2012    clear drainage  . Cataract of right eye   . Ulcer of toe of right foot 07/10/2012    great toe  . Breast calcification, left 06/2012  . Nephrotic syndrome   . Nephrotic range proteinuria     Past Surgical History  Procedure Laterality Date  . Cardiac catheterization  07/16/2011  . Incision and drainage breast abscess Left   . Tubal ligation  1994  . Vitrectomy  2010    2 on left, 1 on right  . Cesarean section  1991; 1994  . Video assisted thoracoscopy (vats)/ lobectomy Left 07/30/2011    left main thoracotomy, left lower lobectomy, mediastinal lymph node dissection    Family History  Problem Relation Age of Onset  . Coronary artery disease Father   . Asthma Father   . COPD Father   . Hypertension Father   . Hyperlipidemia Father   . Diabetes Father   . Congestive Heart Failure Father   . Lung cancer Mother   . Hypertension Mother   . Hyperlipidemia Mother   . Diabetes Mother   . Lung cancer Mother   .  Cancer Maternal Aunt   . Hypertension Brother   . Diabetes Brother   . Diabetes Sister     Social History History  Substance Use Topics  . Smoking status: Former Smoker -- 2.00 packs/day for 30 years    Quit date: 08/01/2011  . Smokeless tobacco: Never Used  . Alcohol Use: No    Current Outpatient Prescriptions  Medication Sig Dispense Refill  . furosemide (LASIX) 40 MG tablet Take 40-80 mg by mouth 2 (two) times daily. 80mg  in AM and 40mg  in pm      . insulin detemir (LEVEMIR) 100 UNIT/ML injection Inject 40 Units into the skin at bedtime.       . insulin lispro (HUMALOG) 100 UNIT/ML injection Inject 5 units subcutaneously with each meal.  15 mL  3  . metoprolol succinate (TOPROL XL) 25 MG 24 hr tablet Take 1 tablet (25 mg total) by mouth daily.  30 tablet  3  . ranitidine (ZANTAC) 150 MG tablet TAKE ONE TABLET BY MOUTH EVERY DAY AT BEDTIME  30 tablet  3   No current facility-administered medications for this visit.    Allergies  Allergen Reactions  . Ciprofloxacin Rash    Review of  Systems no fever or weight loss  BP 122/58  Pulse 58  Resp 20  Ht 5\' 7"  (1.702 m)  Wt 251 lb (113.853 kg)  BMI 39.3 kg/m2  SpO2 96%  LMP 07/18/2012 Physical Exam Alert and comfortable Breath sounds clear and equal bilaterally Left thoracotomy incision well-healed Cardiac rhythm regular  Diagnostic Tests:  CTA chest shows no evidence recurrent disease and left lung or mediastinum      Impression: Disease-free at one year following left lower lobectomy for adenocarcinoma   Plan: Return for CT scan in one year for surveillance following resection for cancer.

## 2012-08-08 ENCOUNTER — Encounter: Payer: Self-pay | Admitting: Gastroenterology

## 2012-08-08 ENCOUNTER — Ambulatory Visit (INDEPENDENT_AMBULATORY_CARE_PROVIDER_SITE_OTHER): Payer: Medicare Other | Admitting: Gastroenterology

## 2012-08-08 VITALS — BP 120/70 | HR 75 | Temp 98.2°F | Ht 67.0 in | Wt 266.4 lb

## 2012-08-08 DIAGNOSIS — R12 Heartburn: Secondary | ICD-10-CM

## 2012-08-08 DIAGNOSIS — K219 Gastro-esophageal reflux disease without esophagitis: Secondary | ICD-10-CM

## 2012-08-08 DIAGNOSIS — R197 Diarrhea, unspecified: Secondary | ICD-10-CM

## 2012-08-08 DIAGNOSIS — R112 Nausea with vomiting, unspecified: Secondary | ICD-10-CM

## 2012-08-08 DIAGNOSIS — R1310 Dysphagia, unspecified: Secondary | ICD-10-CM

## 2012-08-08 DIAGNOSIS — D649 Anemia, unspecified: Secondary | ICD-10-CM

## 2012-08-08 DIAGNOSIS — R1314 Dysphagia, pharyngoesophageal phase: Secondary | ICD-10-CM

## 2012-08-08 MED ORDER — PEG 3350-KCL-NA BICARB-NACL 420 G PO SOLR: 4000.0000 mL | ORAL | Status: DC

## 2012-08-08 NOTE — Patient Instructions (Addendum)
We have scheduled you for upper endoscopy and colonoscopy. Please see separate instructions. Please have your blood work done as soon as possible. We will need to have the results back before your procedures.

## 2012-08-08 NOTE — Progress Notes (Signed)
Primary Care Physician:  Milinda Antis, MD  Primary Gastroenterologist:  Jonette Eva, MD   Chief Complaint  Patient presents with  . Nausea  . Emesis  . Diarrhea  . Abdominal Pain    HPI:  Hannah [name] is a 42 y.o. female here for further evaluation of N/V/D, abdominal pain at the request of Dr. Jeanice Lim.   N/V daily for about one year. Gradually getting worse. Wake up vomiting. Like morning sickness. May happen during a meal. May occur 1-3 times daily. No hematemesis. No weight loss. Heartburn +. On Zantac for few months. Never on a PPI. Some solid food dysphagia to breads/meats. Eggs/milk/cheese/certain veggies make crampy abdominal pain and loose stool (new finding). No melena, brbpr. Goes from constipation to diarrhea but mostly diarrhea. Previously on glipizide and metformin. No improvement in GI symptoms when these were stopped. Now Humulog with each meal and still on Levemir. No NSAIDs. Was on ASA 81mg  daily but seemed to make stomach hurt more so she stopped and this has helped some.   No prior EGD/TCS.  Current Outpatient Prescriptions  Medication Sig Dispense Refill  . furosemide (LASIX) 40 MG tablet Take 40-80 mg by mouth 2 (two) times daily. 80mg  in AM and 40mg  in pm      . insulin detemir (LEVEMIR) 100 UNIT/ML injection Inject 40 Units into the skin at bedtime.       . insulin lispro (HUMALOG) 100 UNIT/ML injection Inject 5 units subcutaneously with each meal.  15 mL  3  . metoprolol succinate (TOPROL XL) 25 MG 24 hr tablet Take 1 tablet (25 mg total) by mouth daily.  30 tablet  3  . ranitidine (ZANTAC) 150 MG tablet TAKE ONE TABLET BY MOUTH EVERY DAY AT BEDTIME  30 tablet  3   No current facility-administered medications for this visit.    Allergies as of 08/08/2012 - Review Complete 08/08/2012  Allergen Reaction Noted  . Ciprofloxacin Rash 05/31/2011    Past Medical History  Diagnosis Date  . High cholesterol   . Diabetic retinopathy(362.0)   . Peripheral  neuropathy     "tips of toes"  . Blind right eye   . Cardiomyopathy   . CHF (congestive heart failure)   . Cancer     Adenocarcinoma of lung  . CAD (coronary artery disease)     NO STENTS  . GERD (gastroesophageal reflux disease)   . Hypertension     under control with med., has been on med. x 1 yr.  . History of lung cancer 07/2011    s/p left lower lobectomy  . Diabetes mellitus     IDDM  . Runny nose 07/10/2012    clear drainage  . Cataract of right eye   . Ulcer of toe of right foot 07/10/2012    great toe  . Breast calcification, left 06/2012  . Nephrotic syndrome   . Nephrotic range proteinuria     Past Surgical History  Procedure Laterality Date  . Cardiac catheterization  07/16/2011  . Incision and drainage breast abscess Left   . Tubal ligation  1994  . Vitrectomy  2010    2 on left, 1 on right  . Cesarean section  1991; 1994  . Video assisted thoracoscopy (vats)/ lobectomy Left 07/30/2011    left main thoracotomy, left lower lobectomy, mediastinal lymph node dissection    Family History  Problem Relation Age of Onset  . Coronary artery disease Father   . Asthma Father   .  COPD Father   . Hypertension Father   . Hyperlipidemia Father   . Diabetes Father   . Congestive Heart Failure Father   . Hypertension Mother   . Hyperlipidemia Mother   . Diabetes Mother   . Cancer Maternal Aunt      three aunts, bone, breast, ?  . Hypertension Brother   . Diabetes Brother   . Diabetes Sister   . Colon cancer Neg Hx   . Lung cancer Maternal Grandmother   . Celiac disease Neg Hx   . Crohn's disease Neg Hx   . Ulcerative colitis Neg Hx     History   Social History  . Marital Status: Married    Spouse Name: N/A    Number of Children: 3  . Years of Education: N/A   Occupational History  . Not on file.   Social History Main Topics  . Smoking status: Former Smoker -- 2.00 packs/day for 30 years    Quit date: 08/01/2011  . Smokeless tobacco: Never Used  .  Alcohol Use: No  . Drug Use: No  . Sexually Active: Yes   Other Topics Concern  . Not on file   Social History Narrative  . No narrative on file      ROS:  General: Negative for anorexia, weight loss, fever, chills, fatigue, weakness. Eyes: Negative for vision changes.  ENT: Negative for hoarseness, difficulty swallowing , nasal congestion. CV: Negative for chest pain, angina, palpitations, dyspnea on exertion, peripheral edema.  Respiratory: Negative for dyspnea at rest, dyspnea on exertion, cough, sputum, wheezing.  GI: See history of present illness. GU:  Negative for dysuria, hematuria, urinary incontinence, urinary frequency, nocturnal urination.  MS: Negative for joint pain, low back pain.  Derm: Negative for rash or itching.  Neuro: Negative for weakness, abnormal sensation, seizure, frequent headaches, memory loss, confusion.  Psych: Negative for anxiety, depression, suicidal ideation, hallucinations.  Endo: Negative for unusual weight change.  Heme: Negative for bruising or bleeding. Allergy: Negative for rash or hives.    Physical Examination:  BP 120/70  Pulse 75  Temp(Src) 98.2 F (36.8 C) (Oral)  Ht 5\' 7"  (1.702 m)  Wt 266 lb 6.4 oz (120.838 kg)  BMI 41.71 kg/m2  LMP 07/18/2012   General: Well-nourished, well-developed in no acute distress.  Head: Normocephalic, atraumatic.   Eyes: Conjunctiva pink, no icterus. Mouth: Oropharyngeal mucosa moist and pink , no lesions erythema or exudate. Neck: Supple without thyromegaly, masses, or lymphadenopathy.  Lungs: Clear to auscultation bilaterally.  Heart: Regular rate and rhythm, no murmurs rubs or gallops.  Abdomen: Bowel sounds are normal, nontender, nondistended, no hepatosplenomegaly or masses, no abdominal bruits or    hernia , no rebound or guarding.   Rectal: defer Extremities: 1+ lower extremity edema bilaterally. No clubbing or deformities.  Neuro: Alert and oriented x 4 , grossly normal  neurologically.  Skin: Warm and dry, no rash or jaundice.   Psych: Alert and cooperative, normal mood and affect.  Labs: Stool culture negative 07/2012 C.Diff TX: negative 07/2012 Heme negative 07/2012 Lactoferrin negative 07/2012  Lab Results  Component Value Date   CREATININE 1.77* 07/21/2012   BUN 30* 07/21/2012   NA 137 07/21/2012   K 4.5 07/21/2012   CL 106 07/21/2012   CO2 24 07/21/2012  Baseline Creatitine as above.   Lab Results  Component Value Date   WBC 8.8 07/12/2012   HGB 11.9* 07/12/2012   HCT 34.4* 07/12/2012   MCV 85.8 07/12/2012  PLT 280 07/12/2012   Lab Results  Component Value Date   ALT 17 07/12/2012   AST 11 07/12/2012   ALKPHOS 76 07/12/2012   BILITOT 0.1* 07/12/2012   Lab Results  Component Value Date   HGBA1C 11.7* 05/05/2012   Lab Results  Component Value Date   IRON 32* 06/13/2011   TIBC 143* 06/13/2011   No results found for this basename: FERRITIN   No results found for this basename: FOLATE   No results found for this basename: VITAMINB12     Imaging Studies: Dg Chest 2 View  07/28/2012  *RADIOLOGY REPORT*  Clinical Data: Cough, fever, prior left lower lobe lung cancer  CHEST - 2 VIEW  Comparison: 02/09/2012 and several other prior exams  Findings: Ill-defined increased right infrahilar opacity medially along the right cardiac angle.  Developing atelectasis or airspace process in this region is not entirely excluded when compared to the prior exams.  Left lung remains clear.  No effusion or pneumothorax.  No edema or CHF.  Normal heart size and vascularity. Trachea is midline.  IMPRESSION: Increased ill-defined right infrahilar opacity medially, atelectasis versus developing pneumonia not excluded.  Recommend radiographic follow-up to document resolution.   Original Report Authenticated By: Judie Petit. Miles Costain, M.D.    Ct Chest Wo Contrast  08/02/2012  *RADIOLOGY REPORT*  Clinical Data: Lung cancer with lobectomy.  Possible pneumonia on recent chest radiograph.  CT  CHEST WITHOUT CONTRAST  Technique:  Multidetector CT imaging of the chest was performed following the standard protocol without IV contrast.  Comparison: 07/28/2012.  Findings: Postoperative changes of left lower lobectomy.  Scarring is present in the right middle lobe accounting for opacity seen on prior chest radiograph.  No airspace disease or post infectious/inflammatory changes are identified.  The age advanced the aortic and coronary artery atherosclerosis.  There is no axillary adenopathy.  No mediastinal or hilar adenopathy.  No effusion.  Incidental imaging the upper abdomen demonstrates dependently layering calcified gallstones.  No aggressive osseous lesions. The central airways are patent.  IMPRESSION: 1.  Left lower lobectomy.  No evidence of recurrent or metastatic disease. 2.  Opacity on prior chest radiograph is compatible with scarring in the right middle lobe adjacent to the mediastinum. 3.  No active cardiopulmonary disease.   Original Report Authenticated By: Andreas Newport, M.D.

## 2012-08-08 NOTE — Progress Notes (Signed)
Forwarded to PCP.

## 2012-08-08 NOTE — Assessment & Plan Note (Signed)
Predominantly diarrhea with occasional constipation. No improvement with holding metformin. Stool studies including Hemoccult, culture, lactoferrin, C. difficile toxin were all negative. Consider celiac disease. Will check labs for screening. Colonoscopy in the near future, consider random colon biopsies and terminal ileoscopy.  I have discussed the risks, alternatives, benefits with regards to but not limited to the risk of reaction to medication, bleeding, infection, perforation and the patient is agreeable to proceed. Written consent to be obtained.

## 2012-08-08 NOTE — Assessment & Plan Note (Signed)
Mild normocytic anemia. Last anemia labs were done about a year ago and at that time she had a slightly low iron, her TIBC was very low. Ferritin, B12, and folate not done. Pattern most consistent with anemia of chronic disease. EGD and colonoscopy as planned.

## 2012-08-08 NOTE — Assessment & Plan Note (Signed)
42 year old lady with poorly controlled diabetes mellitus who presents with worsening of nausea/vomiting, recent onset heartburn. Likely has gastroparesis. She has never had her upper GI tract evaluated however therefore upper endoscopy is recommended. She also has solid food esophageal dysphagia he may have developed esophageal stricture. Recommend EGD with dilation in the near future.  I have discussed the risks, alternatives, benefits with regards to but not limited to the risk of reaction to medication, bleeding, infection, perforation and the patient is agreeable to proceed. Written consent to be obtained.  After EGD, we will determine if she needs PPI therapy for GERD.

## 2012-08-09 ENCOUNTER — Emergency Department (HOSPITAL_COMMUNITY): Payer: Medicare Other

## 2012-08-09 ENCOUNTER — Other Ambulatory Visit: Payer: Self-pay

## 2012-08-09 ENCOUNTER — Encounter (HOSPITAL_COMMUNITY): Payer: Self-pay

## 2012-08-09 ENCOUNTER — Observation Stay (HOSPITAL_COMMUNITY)
Admission: EM | Admit: 2012-08-09 | Discharge: 2012-08-12 | DRG: 699 | Disposition: A | Discharge status: Home / Self Care | Payer: Medicare Other | Attending: Internal Medicine | Admitting: Internal Medicine

## 2012-08-09 DIAGNOSIS — N179 Acute kidney failure, unspecified: Secondary | ICD-10-CM | POA: Insufficient documentation

## 2012-08-09 DIAGNOSIS — Z794 Long term (current) use of insulin: Secondary | ICD-10-CM | POA: Insufficient documentation

## 2012-08-09 DIAGNOSIS — N049 Nephrotic syndrome with unspecified morphologic changes: Principal | ICD-10-CM | POA: Insufficient documentation

## 2012-08-09 DIAGNOSIS — N289 Disorder of kidney and ureter, unspecified: Secondary | ICD-10-CM

## 2012-08-09 DIAGNOSIS — L97509 Non-pressure chronic ulcer of other part of unspecified foot with unspecified severity: Secondary | ICD-10-CM | POA: Insufficient documentation

## 2012-08-09 DIAGNOSIS — K219 Gastro-esophageal reflux disease without esophagitis: Secondary | ICD-10-CM | POA: Insufficient documentation

## 2012-08-09 DIAGNOSIS — R609 Edema, unspecified: Secondary | ICD-10-CM | POA: Insufficient documentation

## 2012-08-09 DIAGNOSIS — I509 Heart failure, unspecified: Secondary | ICD-10-CM | POA: Insufficient documentation

## 2012-08-09 DIAGNOSIS — G609 Hereditary and idiopathic neuropathy, unspecified: Secondary | ICD-10-CM | POA: Insufficient documentation

## 2012-08-09 DIAGNOSIS — E7800 Pure hypercholesterolemia, unspecified: Secondary | ICD-10-CM | POA: Insufficient documentation

## 2012-08-09 DIAGNOSIS — Z87891 Personal history of nicotine dependence: Secondary | ICD-10-CM | POA: Insufficient documentation

## 2012-08-09 DIAGNOSIS — N185 Chronic kidney disease, stage 5: Secondary | ICD-10-CM | POA: Diagnosis present

## 2012-08-09 DIAGNOSIS — E11319 Type 2 diabetes mellitus with unspecified diabetic retinopathy without macular edema: Secondary | ICD-10-CM | POA: Insufficient documentation

## 2012-08-09 DIAGNOSIS — I10 Essential (primary) hypertension: Secondary | ICD-10-CM

## 2012-08-09 DIAGNOSIS — E1139 Type 2 diabetes mellitus with other diabetic ophthalmic complication: Secondary | ICD-10-CM | POA: Insufficient documentation

## 2012-08-09 DIAGNOSIS — Z79899 Other long term (current) drug therapy: Secondary | ICD-10-CM | POA: Insufficient documentation

## 2012-08-09 DIAGNOSIS — I428 Other cardiomyopathies: Secondary | ICD-10-CM | POA: Insufficient documentation

## 2012-08-09 DIAGNOSIS — R110 Nausea: Secondary | ICD-10-CM | POA: Insufficient documentation

## 2012-08-09 DIAGNOSIS — H269 Unspecified cataract: Secondary | ICD-10-CM | POA: Insufficient documentation

## 2012-08-09 DIAGNOSIS — R601 Generalized edema: Secondary | ICD-10-CM

## 2012-08-09 DIAGNOSIS — E8779 Other fluid overload: Secondary | ICD-10-CM | POA: Insufficient documentation

## 2012-08-09 DIAGNOSIS — N183 Chronic kidney disease, stage 3 unspecified: Secondary | ICD-10-CM | POA: Insufficient documentation

## 2012-08-09 DIAGNOSIS — E119 Type 2 diabetes mellitus without complications: Secondary | ICD-10-CM | POA: Diagnosis present

## 2012-08-09 DIAGNOSIS — I2510 Atherosclerotic heart disease of native coronary artery without angina pectoris: Secondary | ICD-10-CM | POA: Insufficient documentation

## 2012-08-09 DIAGNOSIS — I129 Hypertensive chronic kidney disease with stage 1 through stage 4 chronic kidney disease, or unspecified chronic kidney disease: Secondary | ICD-10-CM | POA: Insufficient documentation

## 2012-08-09 DIAGNOSIS — Z85118 Personal history of other malignant neoplasm of bronchus and lung: Secondary | ICD-10-CM | POA: Insufficient documentation

## 2012-08-09 DIAGNOSIS — R079 Chest pain, unspecified: Secondary | ICD-10-CM | POA: Insufficient documentation

## 2012-08-09 LAB — TROPONIN I
Troponin I: 0.3 ng/mL (ref <0.30)
Troponin I: 0.3 ng/mL (ref <0.30)
Troponin I: <0.3 ng/mL (ref <0.30)

## 2012-08-09 LAB — BASIC METABOLIC PANEL
BUN: 29 mg/dL — ABNORMAL HIGH (ref 6–23)
CO2: 24 mEq/L (ref 19–32)
Calcium: 8.2 mg/dL — ABNORMAL LOW (ref 8.4–10.5)
Creatinine, Ser: 1.64 mg/dL — ABNORMAL HIGH (ref 0.50–1.10)
Glucose, Bld: 335 mg/dL — ABNORMAL HIGH (ref 70–99)

## 2012-08-09 LAB — URINE MICROSCOPIC-ADD ON

## 2012-08-09 LAB — CBC WITH DIFFERENTIAL/PLATELET
Basophils Absolute: 0 10*3/uL (ref 0.0–0.1)
Eosinophils Absolute: 0.2 10*3/uL (ref 0.0–0.7)
Eosinophils Relative: 3 % (ref 0–5)
HCT: 34.5 % — ABNORMAL LOW (ref 36.0–46.0)
MCH: 30 pg (ref 26.0–34.0)
MCV: 89.1 fL (ref 78.0–100.0)
Monocytes Absolute: 0.3 10*3/uL (ref 0.1–1.0)
Platelets: 339 10*3/uL (ref 150–400)
RDW: 13 % (ref 11.5–15.5)

## 2012-08-09 LAB — URINALYSIS, ROUTINE W REFLEX MICROSCOPIC
Bilirubin Urine: NEGATIVE
Glucose, UA: >1000 mg/dL — AB
Ketones, ur: NEGATIVE mg/dL
Leukocytes, UA: NEGATIVE
Nitrite: NEGATIVE
Protein, ur: >300 mg/dL — AB

## 2012-08-09 MED ORDER — ALBUTEROL SULFATE (5 MG/ML) 0.5% IN NEBU: 2.5000 mg | INHALATION_SOLUTION | RESPIRATORY_TRACT | Status: DC | PRN

## 2012-08-09 MED ORDER — SODIUM CHLORIDE 0.9 % IJ SOLN: 3.0000 mL | INTRAMUSCULAR | Status: DC | PRN

## 2012-08-09 MED ORDER — METOPROLOL SUCCINATE ER 25 MG PO TB24
25.0000 mg | ORAL_TABLET | Freq: Every day | ORAL | Status: DC
  Administered 2012-08-10 – 2012-08-12 (×3): 25 mg via ORAL
  Filled 2012-08-09 (×3): qty 1

## 2012-08-09 MED ORDER — ACETAMINOPHEN 650 MG RE SUPP: 650.0000 mg | Freq: Four times a day (QID) | RECTAL | Status: DC | PRN

## 2012-08-09 MED ORDER — SODIUM CHLORIDE 0.9 % IJ SOLN
3.0000 mL | Freq: Two times a day (BID) | INTRAMUSCULAR | Status: DC
  Administered 2012-08-10: 3 mL via INTRAVENOUS
  Filled 2012-08-09: qty 3

## 2012-08-09 MED ORDER — INSULIN ASPART 100 UNIT/ML ~~LOC~~ SOLN
0.0000 [IU] | Freq: Three times a day (TID) | SUBCUTANEOUS | Status: DC
  Administered 2012-08-10 – 2012-08-11 (×3): 5 [IU] via SUBCUTANEOUS
  Administered 2012-08-11: 2 [IU] via SUBCUTANEOUS
  Administered 2012-08-11: 5 [IU] via SUBCUTANEOUS
  Administered 2012-08-12: 3 [IU] via SUBCUTANEOUS

## 2012-08-09 MED ORDER — INSULIN DETEMIR 100 UNIT/ML ~~LOC~~ SOLN
40.0000 [IU] | Freq: Every day | SUBCUTANEOUS | Status: DC
  Administered 2012-08-09 – 2012-08-11 (×3): 40 [IU] via SUBCUTANEOUS
  Filled 2012-08-09 (×5): qty 0.4

## 2012-08-09 MED ORDER — ASPIRIN EC 81 MG PO TBEC
81.0000 mg | DELAYED_RELEASE_TABLET | Freq: Every day | ORAL | Status: DC
  Administered 2012-08-10 – 2012-08-12 (×3): 81 mg via ORAL
  Filled 2012-08-09 (×3): qty 1

## 2012-08-09 MED ORDER — INSULIN ASPART 100 UNIT/ML ~~LOC~~ SOLN
0.0000 [IU] | Freq: Every day | SUBCUTANEOUS | Status: DC
  Administered 2012-08-09: 2 [IU] via SUBCUTANEOUS
  Administered 2012-08-10: 4 [IU] via SUBCUTANEOUS
  Administered 2012-08-11: 2 [IU] via SUBCUTANEOUS

## 2012-08-09 MED ORDER — SODIUM CHLORIDE 0.9 % IJ SOLN
3.0000 mL | Freq: Two times a day (BID) | INTRAMUSCULAR | Status: DC
  Administered 2012-08-09 – 2012-08-10 (×2): 3 mL via INTRAVENOUS

## 2012-08-09 MED ORDER — ONDANSETRON HCL 4 MG PO TABS: 4.0000 mg | ORAL_TABLET | Freq: Four times a day (QID) | ORAL | Status: DC | PRN

## 2012-08-09 MED ORDER — INSULIN DETEMIR 100 UNIT/ML ~~LOC~~ SOLN
SUBCUTANEOUS | Status: AC
  Filled 2012-08-09: qty 1

## 2012-08-09 MED ORDER — ACETAMINOPHEN 325 MG PO TABS
650.0000 mg | ORAL_TABLET | Freq: Four times a day (QID) | ORAL | Status: DC | PRN
  Administered 2012-08-10: 650 mg via ORAL
  Filled 2012-08-09: qty 2

## 2012-08-09 MED ORDER — FUROSEMIDE 10 MG/ML IJ SOLN
80.0000 mg | Freq: Two times a day (BID) | INTRAMUSCULAR | Status: DC
  Administered 2012-08-10: 80 mg via INTRAVENOUS
  Filled 2012-08-09: qty 8

## 2012-08-09 MED ORDER — FUROSEMIDE 10 MG/ML IJ SOLN
40.0000 mg | Freq: Once | INTRAMUSCULAR | Status: AC
  Administered 2012-08-09: 40 mg via INTRAVENOUS
  Filled 2012-08-09: qty 4

## 2012-08-09 MED ORDER — ASPIRIN 81 MG PO CHEW
324.0000 mg | CHEWABLE_TABLET | Freq: Once | ORAL | Status: AC
  Administered 2012-08-09: 324 mg via ORAL
  Filled 2012-08-09: qty 4

## 2012-08-09 MED ORDER — HEPARIN SODIUM (PORCINE) 5000 UNIT/ML IJ SOLN
5000.0000 [IU] | Freq: Three times a day (TID) | INTRAMUSCULAR | Status: DC
  Administered 2012-08-09 – 2012-08-12 (×8): 5000 [IU] via SUBCUTANEOUS
  Filled 2012-08-09 (×8): qty 1

## 2012-08-09 MED ORDER — ONDANSETRON HCL 4 MG/2ML IJ SOLN: 4.0000 mg | Freq: Four times a day (QID) | INTRAMUSCULAR | Status: DC | PRN

## 2012-08-09 MED ORDER — FUROSEMIDE 10 MG/ML IJ SOLN
40.0000 mg | Freq: Once | INTRAMUSCULAR | Status: AC
Start: 2012-08-09 — End: 2012-08-09
  Administered 2012-08-09: 40 mg via INTRAVENOUS
  Filled 2012-08-09: qty 4

## 2012-08-09 MED ORDER — SODIUM CHLORIDE 0.9 % IV SOLN: 250.0000 mL | INTRAVENOUS | Status: DC | PRN

## 2012-08-09 NOTE — ED Provider Notes (Signed)
History     CSN: [id number]  Arrival date & time 08/09/12  1356   First MD Initiated Contact with Patient 08/09/12 1437      Chief Complaint  Patient presents with  . fluid retention     HPI Pt was seen at 1455.   Per pt, c/o gradual onset and worsening of persistent peripheral edema for the past 1 to 2 weeks. Pt states she "gets puffy" for about 1 week every few weeks "but then it just gets better."  Pt states she has been taking her lasix as prescribed by her PMD without improvement.  Denies CP/palpitations, no SOB/cough, no abd pain, no N/V/D, no fevers.     Past Medical History  Diagnosis Date  . High cholesterol   . Diabetic retinopathy(362.0)   . Peripheral neuropathy     "tips of toes"  . Blind right eye   . Cardiomyopathy   . CHF (congestive heart failure)   . Cancer     Adenocarcinoma of lung  . CAD (coronary artery disease)     NO STENTS  . GERD (gastroesophageal reflux disease)   . Hypertension     under control with med., has been on med. x 1 yr.  . History of lung cancer 07/2011    s/p left lower lobectomy  . Diabetes mellitus     IDDM  . Runny nose 07/10/2012    clear drainage  . Cataract of right eye   . Ulcer of toe of right foot 07/10/2012    great toe  . Breast calcification, left 06/2012  . Nephrotic syndrome   . Nephrotic range proteinuria     Past Surgical History  Procedure Laterality Date  . Cardiac catheterization  07/16/2011  . Incision and drainage breast abscess Left   . Tubal ligation  1994  . Vitrectomy  2010    2 on left, 1 on right  . Cesarean section  1991; 1994  . Video assisted thoracoscopy (vats)/ lobectomy Left 07/30/2011    left main thoracotomy, left lower lobectomy, mediastinal lymph node dissection  . Lobectomy      Family History  Problem Relation Age of Onset  . Coronary artery disease Father   . Asthma Father   . COPD Father   . Hypertension Father   . Hyperlipidemia Father   . Diabetes Father   . Congestive  Heart Failure Father   . Hypertension Mother   . Hyperlipidemia Mother   . Diabetes Mother   . Cancer Maternal Aunt      three aunts, bone, breast, ?  . Hypertension Brother   . Diabetes Brother   . Diabetes Sister   . Colon cancer Neg Hx   . Lung cancer Maternal Grandmother   . Celiac disease Neg Hx   . Crohn's disease Neg Hx   . Ulcerative colitis Neg Hx     History  Substance Use Topics  . Smoking status: Former Smoker -- 2.00 packs/day for 30 years    Quit date: 08/01/2011  . Smokeless tobacco: Never Used  . Alcohol Use: No    Review of Systems ROS: Statement: All systems negative except as marked or noted in the HPI; Constitutional: Negative for fever and chills. ; ; Eyes: Negative for eye pain, redness and discharge. ; ; ENMT: Negative for ear pain, hoarseness, nasal congestion, sinus pressure and sore throat. ; ; Cardiovascular: Negative for chest pain, palpitations, diaphoresis, dyspnea and +peripheral edema. ; ; Respiratory: Negative for cough, wheezing and  stridor. ; ; Gastrointestinal: Negative for nausea, vomiting, diarrhea, abdominal pain, blood in stool, hematemesis, jaundice and rectal bleeding. . ; ; Genitourinary: Negative for dysuria, flank pain and hematuria. ; ; Musculoskeletal: Negative for back pain and neck pain. Negative for swelling and trauma.; ; Skin: Negative for pruritus, rash, abrasions, blisters, bruising and skin lesion.; ; Neuro: Negative for headache, lightheadedness and neck stiffness. Negative for weakness, altered level of consciousness , altered mental status, extremity weakness, paresthesias, involuntary movement, seizure and syncope.       Allergies  Ciprofloxacin  Home Medications   Current Outpatient Rx  Name  Route  Sig  Dispense  Refill  . furosemide (LASIX) 40 MG tablet   Oral   Take 40-80 mg by mouth 2 (two) times daily. 80mg  in AM and 40mg  in pm         . insulin detemir (LEVEMIR) 100 UNIT/ML injection   Subcutaneous   Inject  40 Units into the skin at bedtime.          . insulin lispro (HUMALOG) 100 UNIT/ML injection      Inject 5 units subcutaneously with each meal.   15 mL   3   . metoprolol succinate (TOPROL XL) 25 MG 24 hr tablet   Oral   Take 1 tablet (25 mg total) by mouth daily.   30 tablet   3   . ranitidine (ZANTAC) 150 MG tablet      TAKE ONE TABLET BY MOUTH EVERY DAY AT BEDTIME   30 tablet   3   . polyethylene glycol-electrolytes (TRILYTE) 420 G solution   Oral   Take 4,000 mLs by mouth as directed.   4000 mL   0     BP 122/68  Pulse 83  Temp(Src) 97.7 F (36.5 C) (Oral)  Resp 18  Ht 5\' 7"  (1.702 m)  Wt 266 lb (120.657 kg)  BMI 41.65 kg/m2  SpO2 97%  LMP 08/06/2012  Physical Exam 1500: Physical examination:  Nursing notes reviewed; Vital signs and O2 SAT reviewed;  Constitutional: Well developed, Well nourished, Well hydrated, In no acute distress; Head:  Normocephalic, atraumatic; Eyes: EOMI, PERRL, No scleral icterus; ENMT: Mouth and pharynx normal, Mucous membranes moist; Neck: Supple, Full range of motion, No lymphadenopathy; Cardiovascular: Regular rate and rhythm, No gallop; Respiratory: Breath sounds clear & equal bilaterally, No wheezes.  Speaking full sentences with ease, Normal respiratory effort/excursion; Chest: Nontender, Movement normal; Abdomen: Soft, Nontender, Nondistended, Normal bowel sounds; Genitourinary: No CVA tenderness; Extremities: Pulses normal, No tenderness, +2 peripheral edema of hands, feet. +face mildly edematous. No rash. No calf asymmetry.; Neuro: AA&Ox3, Major CN grossly intact.  Speech clear. No gross focal motor or sensory deficits in extremities.; Skin: Color normal, Warm, Dry.   ED Course  Procedures     MDM  MDM Reviewed: previous chart, nursing note and vitals Reviewed previous: labs and ECG Interpretation: labs, ECG and x-ray    Date: 08/09/2012  Rate: 94  Rhythm: normal sinus rhythm  QRS Axis: normal  Intervals: normal   ST/T Wave abnormalities: nonspecific ST/T changes leads V5, V6, II, III, aVF, I, aVL  Conduction Disutrbances:none  Narrative Interpretation:   Old EKG Reviewed: changes noted; more pronounced ST changes leads V5, V6, II, III, aVF compared to previous EKG dated 07/29/2011.  Results for orders placed during the hospital encounter of 08/09/12  BASIC METABOLIC PANEL      Result Value Range   Sodium 134 (*) 135 - 145 mEq/L  Potassium 4.1  3.5 - 5.1 mEq/L   Chloride 104  96 - 112 mEq/L   CO2 24  19 - 32 mEq/L   Glucose, Bld 335 (*) 70 - 99 mg/dL   BUN 29 (*) 6 - 23 mg/dL   Creatinine, Ser 1.61 (*) 0.50 - 1.10 mg/dL   Calcium 8.2 (*) 8.4 - 10.5 mg/dL   GFR calc non Af Amer 37 (*) >90 mL/min   GFR calc Af Amer 43 (*) >90 mL/min  CBC WITH DIFFERENTIAL      Result Value Range   WBC 7.3  4.0 - 10.5 K/uL   RBC 3.87  3.87 - 5.11 MIL/uL   Hemoglobin 11.6 (*) 12.0 - 15.0 g/dL   HCT 09.6 (*) 04.5 - 40.9 %   MCV 89.1  78.0 - 100.0 fL   MCH 30.0  26.0 - 34.0 pg   MCHC 33.6  30.0 - 36.0 g/dL   RDW 81.1  91.4 - 78.2 %   Platelets 339  150 - 400 K/uL   Neutrophils Relative 68  43 - 77 %   Neutro Abs 5.0  1.7 - 7.7 K/uL   Lymphocytes Relative 24  12 - 46 %   Lymphs Abs 1.8  0.7 - 4.0 K/uL   Monocytes Relative 4  3 - 12 %   Monocytes Absolute 0.3  0.1 - 1.0 K/uL   Eosinophils Relative 3  0 - 5 %   Eosinophils Absolute 0.2  0.0 - 0.7 K/uL   Basophils Relative 1  0 - 1 %   Basophils Absolute 0.0  0.0 - 0.1 K/uL  URINALYSIS, ROUTINE W REFLEX MICROSCOPIC      Result Value Range   Color, Urine YELLOW  YELLOW   APPearance CLEAR  CLEAR   Specific Gravity, Urine 1.020  1.005 - 1.030   pH 6.5  5.0 - 8.0   Glucose, UA >1000 (*) NEGATIVE mg/dL   Hgb urine dipstick MODERATE (*) NEGATIVE   Bilirubin Urine NEGATIVE  NEGATIVE   Ketones, ur NEGATIVE  NEGATIVE mg/dL   Protein, ur >956 (*) NEGATIVE mg/dL   Urobilinogen, UA 0.2  0.0 - 1.0 mg/dL   Nitrite NEGATIVE  NEGATIVE   Leukocytes, UA NEGATIVE   NEGATIVE  PREGNANCY, URINE      Result Value Range   Preg Test, Ur NEGATIVE  NEGATIVE  PRO B NATRIURETIC PEPTIDE      Result Value Range   Pro B Natriuretic peptide (BNP) 4478.0 (*) 0 - 125 pg/mL  TROPONIN I      Result Value Range   Troponin I 0.30 (*) <0.30 ng/mL  URINE MICROSCOPIC-ADD ON      Result Value Range   Squamous Epithelial / LPF FEW (*) RARE   WBC, UA 7-10  <3 WBC/hpf   RBC / HPF 0-2  <3 RBC/hpf   Bacteria, UA FEW (*) RARE  TROPONIN I      Result Value Range   Troponin I 0.30 (*) <0.30 ng/mL   Dg Chest 2 View 08/09/2012  *RADIOLOGY REPORT*  Clinical Data: Edema.  Cough and congestion.  Short of breath.  CHEST - 2 VIEW  Comparison: Multiple priors, dating back to 07/31/2011.  Findings: Left lower lobectomy.  Chronic bronchitic changes and scarring at the medial right lung base.  Cardiopericardial silhouette appears within normal limits.  There is no airspace disease.  No pleural effusion. Monitoring leads are projected over the chest.  IMPRESSION: No acute cardiopulmonary disease.  Postoperative changes of left  lower lobectomy.   Original Report Authenticated By: Andreas Newport, M.D.    Results for Hannah [name], Hannah [name] (MRN [id number]) as of 08/09/2012 18:57  Ref. Range 07/12/2012 10:30 07/21/2012 14:26 08/09/2012 15:00  BUN Latest Range: 6-23 mg/dL 39 (H) 30 (H) 29 (H)  Creatinine Latest Range: 0.50-1.10 mg/dL 7.82 (H) 9.56 (H) 2.13 (H)   Results for Hannah [name], Hannah [name] (MRN [id number]) as of 08/09/2012 18:57  Ref. Range 10/12/2011 08:29 03/03/2012 14:18 05/05/2012 10:18 07/12/2012 10:30 08/09/2012 15:00  Hemoglobin Latest Range: 12.0-15.0 g/dL 62.9 (L) 52.8 (L) 41.3 (L) 11.9 (L) 11.6 (L)  HCT Latest Range: 36.0-46.0 % 35.3 (L) 35.5 (L) 35.2 (L) 34.4 (L) 34.5 (L)   Results for Hannah [name], Hannah [name] (MRN [id number]) as of 08/09/2012 18:57  Ref. Range 06/09/2011 13:02 08/09/2012 15:00  Pro B Natriuretic peptide (BNP) Latest Range: 0-125 pg/mL 6603.0 (H) 4478.0 (H)     1705:  BUN/Cr and H/H per  baseline.  BNP elevated, but no overt CHF on CXR.  Pt clinically appears fluid overloaded.  Troponin elevated, but pt denies CP and there are no acute STE on EKG today.  Will dose ASA and IV lasix.  T/C to [facility] Dr. Lendell Caprice, case discussed, including:  HPI, pertinent PM/SHx, VS/PE, dx testing, ED course and treatment:  Requests to obtain 2nd troponin to make sure it is not increasing.   1820:  2nd troponin without change.  T/C to [facility] Dr. Lendell Caprice, case discussed, including:  HPI, pertinent PM/SHx, VS/PE, dx testing, ED course and treatment:  Agreeable to admit, requests to write temporary orders, obtain tele bed to team 1.            Laray Anger, DO 08/10/12 1730

## 2012-08-09 NOTE — ED Notes (Signed)
Lab called with critical troponin of 0.30. Dr. Clarene Duke has been notified.

## 2012-08-09 NOTE — ED Notes (Signed)
Pt c/o swelling in legs and arms bilaterally and in face x1 week. Pt states symptoms have gradually worsened. Pt denies any pain or SOB.

## 2012-08-09 NOTE — ED Notes (Signed)
Pt reports has had fluid retention for the past week.  Reports has chronic kidney disease and CHF.  Reports usually will retain fluid for about a week and then it goes away.  Reports this time it is not getting any better.  Reports face, legs, abd, and lower legs are swollen.   Pt says her pcp isn't in the office on Wednesday.   Denies any SOB unless does a lot of walking.

## 2012-08-09 NOTE — H&P (Signed)
[facility] History and Physical  CAPITOLA LADSON ZOX:[id number] DOB: December 09, 1970 DOA: 08/09/2012   PCP: Milinda Antis, MD  Specialists: She is followed by Dr. Jens Som, who is her cardiologist. She is followed by Dr. Abel Presto, who is her nephrologist.  Chief Complaint: "Swelling all over" for one week  HPI: Hannah [name] is a 42 y.o. female with a past medical history of for diabetes, congestive heart failure, lung cancer s/p surgery, chronic kidney disease, diabetic retinopathy, who was in her usual state of health about a week ago when she started noticing that she had worsening swelling in her legs. Subsequently, the swelling started showing up everywhere. Her abdomen started swelling and her face started swelling. Denies any recent new medication. She did mention that her dose of metoprolol was decreased about a couple weeks ago due to drop in blood pressure. She has symptoms consistent with orthopnea and PND. She started having chest pain on the left side about 30 minutes ago, 1 out of 10. It feels like a twinge. Denies any radiation of the pain. Has had some nausea without any vomiting. Has gained about 20 pounds in the last 2 weeks. Denies any dizziness. No palpitations. She has been compliant with her medications. Denies any dietary indiscretion. No cough. No fevers. No shortness of breath otherwise. She last saw her cardiologist 6 months ago, and saw her nephrologist couple weeks ago.  Home Medications: Prior to Admission medications   Medication Sig Start Date End Date Taking? Authorizing Provider  furosemide (LASIX) 40 MG tablet Take 40-80 mg by mouth 2 (two) times daily. 80mg  in AM and 40mg  in pm 03/03/12 03/03/13 Yes Salley Scarlet, MD  insulin detemir (LEVEMIR) 100 UNIT/ML injection Inject 40 Units into the skin at bedtime.    Yes Historical Provider, MD  insulin lispro (HUMALOG) 100 UNIT/ML injection Inject 5 units subcutaneously with each meal. 07/31/12 07/31/13 Yes  Salley Scarlet, MD  metoprolol succinate (TOPROL XL) 25 MG 24 hr tablet Take 1 tablet (25 mg total) by mouth daily. 07/21/12 07/21/13 Yes Salley Scarlet, MD  ranitidine (ZANTAC) 150 MG tablet TAKE ONE TABLET BY MOUTH EVERY DAY AT BEDTIME 08/01/12  Yes Salley Scarlet, MD  polyethylene glycol-electrolytes (TRILYTE) 420 G solution Take 4,000 mLs by mouth as directed. 08/08/12   West Bali, MD    Allergies:  Allergies  Allergen Reactions  . Ciprofloxacin Rash    Past Medical History: Past Medical History  Diagnosis Date  . High cholesterol   . Diabetic retinopathy(362.0)   . Peripheral neuropathy     "tips of toes"  . Blind right eye   . Cardiomyopathy   . CHF (congestive heart failure)   . Cancer     Adenocarcinoma of lung  . CAD (coronary artery disease)     NO STENTS  . GERD (gastroesophageal reflux disease)   . Hypertension     under control with med., has been on med. x 1 yr.  . History of lung cancer 07/2011    s/p left lower lobectomy  . Diabetes mellitus     IDDM  . Runny nose 07/10/2012    clear drainage  . Cataract of right eye   . Ulcer of toe of right foot 07/10/2012    great toe  . Breast calcification, left 06/2012  . Nephrotic syndrome   . Nephrotic range proteinuria     Past Surgical History  Procedure Laterality Date  . Cardiac catheterization  07/16/2011  . Incision  and drainage breast abscess Left   . Tubal ligation  1994  . Vitrectomy  2010    2 on left, 1 on right  . Cesarean section  1991; 1994  . Video assisted thoracoscopy (vats)/ lobectomy Left 07/30/2011    left main thoracotomy, left lower lobectomy, mediastinal lymph node dissection  . Lobectomy      Social History:  reports that she quit smoking about a year ago. She has never used smokeless tobacco. She reports that she does not drink alcohol or use illicit drugs.  Living Situation: She lives in [place] with her husband Activity Level: Usually independent with daily activities    Family History:  Family History  Problem Relation Age of Onset  . Coronary artery disease Father   . Asthma Father   . COPD Father   . Hypertension Father   . Hyperlipidemia Father   . Diabetes Father   . Congestive Heart Failure Father   . Hypertension Mother   . Hyperlipidemia Mother   . Diabetes Mother   . Cancer Maternal Aunt      three aunts, bone, breast, ?  . Hypertension Brother   . Diabetes Brother   . Diabetes Sister   . Colon cancer Neg Hx   . Lung cancer Maternal Grandmother   . Celiac disease Neg Hx   . Crohn's disease Neg Hx   . Ulcerative colitis Neg Hx      Review of Systems - History obtained from the patient General ROS: positive for  - fatigue Psychological ROS: negative Ophthalmic ROS: negative ENT ROS: negative Allergy and Immunology ROS: negative Hematological and Lymphatic ROS: negative Endocrine ROS: negative Respiratory ROS: as in hpi Cardiovascular ROS: as in hpi Gastrointestinal ROS: no abdominal pain, change in bowel habits, or black or bloody stools Genito-Urinary ROS: no dysuria, trouble voiding, or hematuria Musculoskeletal ROS: negative Neurological ROS: no TIA or stroke symptoms Dermatological ROS: negative  Physical Examination  Filed Vitals:   08/09/12 1413 08/09/12 1829 08/09/12 2003  BP: 156/89 122/68 126/85  Pulse: 98 83 88  Temp: 97.7 F (36.5 C)    TempSrc: Oral    Resp: 18 18 20   Height: 5\' 7"  (1.702 m)    Weight: 120.657 kg (266 lb)    SpO2: 99% 97% 97%    General appearance: alert, cooperative, appears stated age, no distress and moderately obese Head: Normocephalic, without obvious abnormality, atraumatic Eyes: conjunctivae/corneas clear. PERRL, EOM's intact.  Throat: lips, mucosa, and tongue normal; teeth and gums normal Neck: no adenopathy, no carotid bruit, no JVD, supple, symmetrical, trachea midline and thyroid not enlarged, symmetric, no tenderness/mass/nodules Back: symmetric, no curvature. ROM normal.  No CVA tenderness. Resp: Decreased air entry at the bases without any wheezing. No definite crackles. Cardio: regular rate and rhythm, S1, S2 normal, no murmur, click, rub or gallop GI: soft, non-tender; bowel sounds normal; no masses,  no organomegaly Extremities: edema 2+ pitting edema bilaterally Pulses: 2+ and symmetric Skin: Skin color, texture, turgor normal. No rashes or lesions Lymph nodes: Cervical, supraclavicular, and axillary nodes normal. Neurologic: Alert and oriented X 3, normal strength and tone. Normal symmetric reflexes. Normal coordination and gait  Laboratory Data: Results for orders placed during the hospital encounter of 08/09/12 (from the past 48 hour(s))  BASIC METABOLIC PANEL     Status: Abnormal   Collection Time    08/09/12  3:00 PM      Result Value Range   Sodium 134 (*) 135 - 145 mEq/L  Potassium 4.1  3.5 - 5.1 mEq/L   Chloride 104  96 - 112 mEq/L   CO2 24  19 - 32 mEq/L   Glucose, Bld 335 (*) 70 - 99 mg/dL   BUN 29 (*) 6 - 23 mg/dL   Creatinine, Ser 4.54 (*) 0.50 - 1.10 mg/dL   Calcium 8.2 (*) 8.4 - 10.5 mg/dL   GFR calc non Af Amer 37 (*) >90 mL/min   GFR calc Af Amer 43 (*) >90 mL/min   Comment:            The eGFR has been calculated     using the CKD EPI equation.     This calculation has not been     validated in all clinical     situations.     eGFR's persistently     <90 mL/min signify     possible Chronic Kidney Disease.  CBC WITH DIFFERENTIAL     Status: Abnormal   Collection Time    08/09/12  3:00 PM      Result Value Range   WBC 7.3  4.0 - 10.5 K/uL   RBC 3.87  3.87 - 5.11 MIL/uL   Hemoglobin 11.6 (*) 12.0 - 15.0 g/dL   HCT 09.8 (*) 11.9 - 14.7 %   MCV 89.1  78.0 - 100.0 fL   MCH 30.0  26.0 - 34.0 pg   MCHC 33.6  30.0 - 36.0 g/dL   RDW 82.9  56.2 - 13.0 %   Platelets 339  150 - 400 K/uL   Neutrophils Relative 68  43 - 77 %   Neutro Abs 5.0  1.7 - 7.7 K/uL   Lymphocytes Relative 24  12 - 46 %   Lymphs Abs 1.8  0.7 - 4.0 K/uL    Monocytes Relative 4  3 - 12 %   Monocytes Absolute 0.3  0.1 - 1.0 K/uL   Eosinophils Relative 3  0 - 5 %   Eosinophils Absolute 0.2  0.0 - 0.7 K/uL   Basophils Relative 1  0 - 1 %   Basophils Absolute 0.0  0.0 - 0.1 K/uL  PRO B NATRIURETIC PEPTIDE     Status: Abnormal   Collection Time    08/09/12  3:00 PM      Result Value Range   Pro B Natriuretic peptide (BNP) 4478.0 (*) 0 - 125 pg/mL  TROPONIN I     Status: Abnormal   Collection Time    08/09/12  3:00 PM      Result Value Range   Troponin I 0.30 (*) <0.30 ng/mL   Comment:            Due to the release kinetics of cTnI,     a negative result within the first hours     of the onset of symptoms does not rule out     myocardial infarction with certainty.     If myocardial infarction is still suspected,     repeat the test at appropriate intervals.     CRITICAL RESULT CALLED TO, READ BACK BY AND VERIFIED WITH:     INGRAM,T ON 08/09/12 AT 1540 BY LOY,C  URINALYSIS, ROUTINE W REFLEX MICROSCOPIC     Status: Abnormal   Collection Time    08/09/12  3:48 PM      Result Value Range   Color, Urine YELLOW  YELLOW   APPearance CLEAR  CLEAR   Specific Gravity, Urine 1.020  1.005 - 1.030   pH  6.5  5.0 - 8.0   Glucose, UA >1000 (*) NEGATIVE mg/dL   Hgb urine dipstick MODERATE (*) NEGATIVE   Bilirubin Urine NEGATIVE  NEGATIVE   Ketones, ur NEGATIVE  NEGATIVE mg/dL   Protein, ur >161 (*) NEGATIVE mg/dL   Urobilinogen, UA 0.2  0.0 - 1.0 mg/dL   Nitrite NEGATIVE  NEGATIVE   Leukocytes, UA NEGATIVE  NEGATIVE  PREGNANCY, URINE     Status: None   Collection Time    08/09/12  3:48 PM      Result Value Range   Preg Test, Ur NEGATIVE  NEGATIVE   Comment:            THE SENSITIVITY OF THIS     METHODOLOGY IS >20 mIU/mL.  URINE MICROSCOPIC-ADD ON     Status: Abnormal   Collection Time    08/09/12  3:48 PM      Result Value Range   Squamous Epithelial / LPF FEW (*) RARE   WBC, UA 7-10  <3 WBC/hpf   RBC / HPF 0-2  <3 RBC/hpf    Bacteria, UA FEW (*) RARE  TROPONIN I     Status: Abnormal   Collection Time    08/09/12  5:09 PM      Result Value Range   Troponin I 0.30 (*) <0.30 ng/mL   Comment:            Due to the release kinetics of cTnI,     a negative result within the first hours     of the onset of symptoms does not rule out     myocardial infarction with certainty.     If myocardial infarction is still suspected,     repeat the test at appropriate intervals.     CRITICAL VALUE NOTED.  VALUE IS CONSISTENT WITH PREVIOUSLY REPORTED AND CALLED VALUE.    Radiology Reports: Dg Chest 2 View  08/09/2012  *RADIOLOGY REPORT*  Clinical Data: Edema.  Cough and congestion.  Short of breath.  CHEST - 2 VIEW  Comparison: Multiple priors, dating back to 07/31/2011.  Findings: Left lower lobectomy.  Chronic bronchitic changes and scarring at the medial right lung base.  Cardiopericardial silhouette appears within normal limits.  There is no airspace disease.  No pleural effusion. Monitoring leads are projected over the chest.  IMPRESSION: No acute cardiopulmonary disease.  Postoperative changes of left lower lobectomy.   Original Report Authenticated By: Andreas Newport, M.D.     Electrocardiogram: EKG shows sinus rhythm at 94 beats per minute. Normal axis. Intervals appear to be normal. There is ST depression noted in the inferior leads, as well as some in the lateral leads. These appear to be more pronounced compared to previous EKG.  Problem List  Principal Problem:   Anasarca Active Problems:   Diabetes mellitus   CAD (coronary artery disease)   CKD (chronic kidney disease), stage III   Elevated troponin   Assessment: This is a 42 year old, Caucasian female, with a past medical history as stated earlier, presents with the swelling all over. She does have significant edema in the lower extremities, as well as in the lower back. This could be considered anasarca, although it appears to be mild. She has mildly  elevated troponins. EKG shows some changes. This will need to be repeated. She has chest pain, however, it appears to be quite atypical.  Plan: #1. Mild anasarca: Etiology remains unclear. Echocardiogram will be repeated. Cardiology will be consulted. It doesn't appear that her renal  function is any worse than normal. We will give her Lasix. Strict ins and outs and daily weights will be measured.  #2 elevated troponin with abnormal EKG: Continues to cycle cardiac enzymes. If the troponin rises anticoagulation will be considered. Continue with aspirin. Continue with beta blockers. EKG will be repeated.  #3 history of chronic kidney disease, stage III: Appears to be stable. Continue to monitor.  #4 history of diabetes mellitus, type II: Continue with insulin. Check HbA1c. Continue with sliding scale.  #5 history of lung cancer: She underwent surgery last year. Didn't require any chemotherapy or radiation treatments. She was seen by a cardiothoracic surgeon recently and was told that there is no indication that she has cancer anymore.  DVT Prophylaxis: Heparin subcutaneously Code Status: Full code Family Communication: Discussed with the patient and her husband  Disposition Plan: Will be admitted to telemetry. Monitor closely. Cardiology will be consulted.   Further management decisions will depend on results of further testing and patient's response to treatment.  [facility]  [facility] Pager [phone]  If 7PM-7AM, please contact night-coverage [email or web address] Password [email or web address]  08/09/2012, 8:51 PM

## 2012-08-09 NOTE — Progress Notes (Signed)
Quick Note:  Celiac screen negative. Procedures as planned. ______

## 2012-08-10 ENCOUNTER — Encounter (HOSPITAL_COMMUNITY): Payer: Self-pay | Admitting: Pharmacy Technician

## 2012-08-10 DIAGNOSIS — I517 Cardiomegaly: Secondary | ICD-10-CM

## 2012-08-10 DIAGNOSIS — I7090 Unspecified atherosclerosis: Secondary | ICD-10-CM

## 2012-08-10 DIAGNOSIS — I2510 Atherosclerotic heart disease of native coronary artery without angina pectoris: Secondary | ICD-10-CM

## 2012-08-10 DIAGNOSIS — R7989 Other specified abnormal findings of blood chemistry: Secondary | ICD-10-CM

## 2012-08-10 LAB — CBC
Platelets: 328 10*3/uL (ref 150–400)
RDW: 13.2 % (ref 11.5–15.5)
WBC: 6.4 10*3/uL (ref 4.0–10.5)

## 2012-08-10 LAB — GLUCOSE, CAPILLARY
Glucose-Capillary: 105 mg/dL — ABNORMAL HIGH (ref 70–99)
Glucose-Capillary: 209 mg/dL — ABNORMAL HIGH (ref 70–99)
Glucose-Capillary: 313 mg/dL — ABNORMAL HIGH (ref 70–99)

## 2012-08-10 LAB — COMPREHENSIVE METABOLIC PANEL
Alkaline Phosphatase: 62 U/L (ref 39–117)
BUN: 28 mg/dL — ABNORMAL HIGH (ref 6–23)
CO2: 22 mEq/L (ref 19–32)
Chloride: 105 mEq/L (ref 96–112)
GFR calc Af Amer: 43 mL/min — ABNORMAL LOW (ref >90)
GFR calc non Af Amer: 37 mL/min — ABNORMAL LOW (ref >90)
Glucose, Bld: 219 mg/dL — ABNORMAL HIGH (ref 70–99)
Potassium: 3.5 mEq/L (ref 3.5–5.1)
Total Bilirubin: <0.1 mg/dL — ABNORMAL LOW (ref 0.3–1.2)

## 2012-08-10 LAB — HEMOGLOBIN A1C: Hgb A1c MFr Bld: 12 % — ABNORMAL HIGH (ref <5.7)

## 2012-08-10 LAB — PRO B NATRIURETIC PEPTIDE: Pro B Natriuretic peptide (BNP): 3433 pg/mL — ABNORMAL HIGH (ref 0–125)

## 2012-08-10 MED ORDER — SODIUM CHLORIDE 0.9 % IJ SOLN: 3.0000 mL | INTRAMUSCULAR | Status: DC | PRN

## 2012-08-10 MED ORDER — LOSARTAN POTASSIUM 50 MG PO TABS
100.0000 mg | ORAL_TABLET | Freq: Every day | ORAL | Status: DC
  Administered 2012-08-10: 100 mg via ORAL
  Filled 2012-08-10: qty 2

## 2012-08-10 MED ORDER — SODIUM CHLORIDE 0.9 % IV SOLN: 250.0000 mL | INTRAVENOUS | Status: DC | PRN

## 2012-08-10 MED ORDER — TORSEMIDE 20 MG PO TABS
50.0000 mg | ORAL_TABLET | Freq: Every day | ORAL | Status: DC
  Administered 2012-08-10: 50 mg via ORAL
  Filled 2012-08-10: qty 3

## 2012-08-10 NOTE — Progress Notes (Signed)
Utilization Review Complete  

## 2012-08-10 NOTE — Consult Note (Signed)
CARDIOLOGY CONSULT NOTE  Patient ID: Hannah [name] MRN: [id number] DOB/AGE: 44/24/1972 44 y.o.  Admit date: 08/09/2012 Referring Physician: PTH Primary Henri Medal, MD Primary Cardiologist: Olga Millers MD Reason for Consultation: Anasarca with known CAD Principal Problem:   Anasarca Active Problems:   Diabetes mellitus   CAD (coronary artery disease)   CKD (chronic kidney disease), stage III   Elevated troponin  HPI: Hannah [name] is a 42 year old female patient admitted with anasarca, 20 pound weight gain, and edema in her legs, abdomen, and face. Symptoms worsened over one week's time.. She has multiple medical problems to include diabetes, CHF, lung cancer status post surgery, chronic kidney disease, diabetic retinopathy, and CAD. She states that over the last month medication changes have been completed by her nephrologist and her primary care physician. These included discontinuation of lisinopril secondary to hypotension, decreased the Toprol dose to 25 mg daily, institution of Humalog insulin. She was continued on Lasix 80mg  in a.m. and 40 mg in the p.m.      Pro BNP was found to be elevated at 3433. Cardiac enzymes are found be negative. Creatinine was 1.63. In ER the patient was found to be hypertensive with a blood pressure 156/89, pulse 98, , O2 sat 99%. She was provided with IV Lasix 40 mg in ER, and has been restarted on Demadex 50 mg by mouth daily. Chest x-ray was negative for CHF. TSH 1.090.    She is followed by Dr. Olga Millers in [place], with known history of CAD, with most recent cardiac catheterization in March of 2013 revealing 30% proximal LAD followed by 2% mid and 40% distal. There is an 80% first diagonal. There were multiple 50-60% mid circumflex lesions. There is an 80% OM1. The right coronary artery was occluded will give collaterals. EF was found to 55%. Also has history of carotid artery disease with greater than 80% LICA stenosis and 60-79%  RICA stenosis. She has an appointment with Dr. Myra Gianotti in May for discussion of carotid endarterectomy to be scheduled.  Review of systems complete and found to be negative unless listed above   Past Medical History  Diagnosis Date  . High cholesterol   . Diabetic retinopathy(362.0)   . Peripheral neuropathy     "tips of toes"  . Blind right eye   . Cardiomyopathy   . CHF (congestive heart failure)   . Cancer     Adenocarcinoma of lung  . CAD (coronary artery disease)     NO STENTS  . GERD (gastroesophageal reflux disease)   . Hypertension     under control with med., has been on med. x 1 yr.  . History of lung cancer 07/2011    s/p left lower lobectomy  . Diabetes mellitus     IDDM  . Runny nose 07/10/2012    clear drainage  . Cataract of right eye   . Ulcer of toe of right foot 07/10/2012    great toe  . Breast calcification, left 06/2012  . Nephrotic syndrome   . Nephrotic range proteinuria     Family History  Problem Relation Age of Onset  . Coronary artery disease Father   . Asthma Father   . COPD Father   . Hypertension Father   . Hyperlipidemia Father   . Diabetes Father   . Congestive Heart Failure Father   . Hypertension Mother   . Hyperlipidemia Mother   . Diabetes Mother   . Cancer Maternal Aunt  three aunts, bone, breast, ?  . Hypertension Brother   . Diabetes Brother   . Diabetes Sister   . Colon cancer Neg Hx   . Lung cancer Maternal Grandmother   . Celiac disease Neg Hx   . Crohn's disease Neg Hx   . Ulcerative colitis Neg Hx     History   Social History  . Marital Status: Married    Spouse Name: N/A    Number of Children: 3  . Years of Education: N/A   Occupational History  . Not on file.   Social History Main Topics  . Smoking status: Former Smoker -- 2.00 packs/day for 30 years    Quit date: 08/01/2011  . Smokeless tobacco: Never Used  . Alcohol Use: No  . Drug Use: No  . Sexually Active: Yes   Other Topics Concern  . Not  on file   Social History Narrative  . No narrative on file    Past Surgical History  Procedure Laterality Date  . Cardiac catheterization  07/16/2011  . Incision and drainage breast abscess Left   . Tubal ligation  1994  . Vitrectomy  2010    2 on left, 1 on right  . Cesarean section  1991; 1994  . Video assisted thoracoscopy (vats)/ lobectomy Left 07/30/2011    left main thoracotomy, left lower lobectomy, mediastinal lymph node dissection  . Lobectomy      Prescriptions prior to admission  Medication Sig Dispense Refill  . furosemide (LASIX) 40 MG tablet Take 40-80 mg by mouth 2 (two) times daily. 80mg  in AM and 40mg  in pm      . insulin detemir (LEVEMIR) 100 UNIT/ML injection Inject 40 Units into the skin at bedtime.       . insulin lispro (HUMALOG) 100 UNIT/ML injection Inject 5 units subcutaneously with each meal.  15 mL  3  . metoprolol succinate (TOPROL XL) 25 MG 24 hr tablet Take 1 tablet (25 mg total) by mouth daily.  30 tablet  3  . ranitidine (ZANTAC) 150 MG tablet TAKE ONE TABLET BY MOUTH EVERY DAY AT BEDTIME  30 tablet  3  . polyethylene glycol-electrolytes (TRILYTE) 420 G solution Take 4,000 mLs by mouth as directed.  4000 mL  0   Echocardiogram: 08/10/2012 Mild LVH, nl EF, no significant valvular abnormalities  Physical Exam: Blood pressure 146/84, pulse 85, temperature 97.6 F (36.4 C), temperature source Oral, resp. rate 20, height 5\' 7"  (1.702 m), weight 264 lb 12.4 oz (120.1 kg), last menstrual period 08/06/2012, SpO2 99.00%.   General: Well developed, well nourished, in no acute distress Head: Eyes PERRLA, No xanthomas.   Normal cephalic and atramatic  Lungs: Clear bilaterally to auscultation and percussion. Heart: HRRR S1 S2, without MRG.  Pulses are 2+ & equal.            No carotid bruit. No JVD.  No abdominal bruits. No femoral bruits. Abdomen: Bowel sounds are positive, abdomen soft and non-tender without masses or                  Hernia's noted. Msk:   Back normal, normal gait. Normal strength and tone for age. Extremities: No clubbing, cyanosis or edema.  DP +1 Neuro: Alert and oriented X 3. Psych:  Good affect, responds appropriately   Lab Results  Component Value Date   WBC 6.4 08/10/2012   HGB 10.9* 08/10/2012   HCT 32.9* 08/10/2012   MCV 89.6 08/10/2012   PLT 328 08/10/2012  Recent Labs Lab 08/10/12 0233  NA 134*  K 3.5  CL 105  CO2 22  BUN 28*  CREATININE 1.63*  CALCIUM 7.9*  PROT 4.5*  BILITOT <0.1*  ALKPHOS 62  ALT 16  AST 15  GLUCOSE 219*   Lab Results  Component Value Date   CKTOTAL 166 06/10/2011   CKMB 6.1* 06/10/2011   TROPONINI <0.30 08/10/2012   BNP (last 3 results)  Recent Labs  08/09/12 1500 08/10/12 0233  PROBNP 4478.0* 3433.0*   Radiology: Dg Chest 2 View  08/09/2012  *RADIOLOGY REPORT*  Clinical Data: Edema.  Cough and congestion.  Short of breath.  CHEST - 2 VIEW  Comparison: Multiple priors, dating back to 07/31/2011.  Findings: Left lower lobectomy.  Chronic bronchitic changes and scarring at the medial right lung base.  Cardiopericardial silhouette appears within normal limits.  There is no airspace disease.  No pleural effusion. Monitoring leads are projected over the chest.  IMPRESSION: No acute cardiopulmonary disease.  Postoperative changes of left lower lobectomy.   Original Report Authenticated By: Andreas Newport, M.D.    EKG: 08/09/12: Normal sinus rhythm; left atrial abnormality; slightly delayed R-wave progression; borderline low-voltage; ST-T wave abnormality consistent with inferolateral ischemia or LVH. Comparison with previous tracing performed 01/21/12, ST-T wave abnormalities are more prominent on the present EKG.  ASSESSMENT AND PLAN:   1. Anasarca: Nephrotic syndrome. She continues to diuresis with improvement of symptoms.  2.  History of nephrotic syndrome: Patient's albumin 1.3. Creatinine 1.63. She is being followed by nephrology.  3. CAD: Moderate three-vessel coronary  disease with preserved LV systolic function. She did have some ST T wave changes inferior laterally with chest discomfort but normal cardiac enzymes.  Continue beta blocker, aspirin, and has recently been started on ARB. EF per echocardiogram was normal. TSH has been checked and is found to be within normal limits.  4. Diabetes: A1c in January 2014 was 11.7. Medications have been adjusted per primary care to include daytime insulin addition, and continuation of the Levamir at at bedtime.   Bettey Mare. Lyman Bishop NP [facility] 08/10/2012, 5:37 PM  Cardiology Attending Patient interviewed and examined. Discussed with Joni Reining, NP.  Above note annotated and modified based upon my findings.  Left ventricular systolic function remains normal, and there is no evidence on physical examination for a cardiac contribution to edema and fluid overload. This is all likely related to nephrotic syndrome. EKG changes are nonspecific and may be related to electrolytes, increased total body water, physiologic stress etc. Agree with plans for diuresis. I doubt that any further cardiac testing or treatment is required.  Altoona Bing, MD 08/11/2012, 9:09 AM

## 2012-08-10 NOTE — Progress Notes (Signed)
[facility] PROGRESS NOTE  Hannah [name]:[id number] DOB: June 27, 1970 DOA: 08/09/2012 PCP: Milinda Antis, MD  Assessment/Plan: 1. Anasarca.  Likely due to nephrotic syndrome.  She has >300 protein in urine and serum albumin is 1.3.  She is on VI diuretics.  Will request nephrology consultation.  She has seen Dr. Abel Presto in the past.  She has never had a kidney biopsy.  Check 24hr urine for protein. 2. Elevated troponin and abnormal EKG.  Cardiology has been consulted.  Await input. Echocardiogram has been ordered. 3. CKD 3.  Creatinine appears to be at baseline at 1.6 4. Diabetes, type 2.  Continue with sliding scale 5. History of lung cancer.  S/p resection.  Did not undergo any chemo/radiation in the past.  Code Status: full code Family Communication: discussed with patient and family at the bedside Disposition Plan: discharge home once improved   Consultants:  [facility]  Nephrology  Procedures:  none  Antibiotics:  none  HPI/Subjective: Still feels swollen, no further chest pain or shortness of breath  Objective: Filed Vitals:   08/09/12 2003 08/09/12 2103 08/10/12 0419 08/10/12 1344  BP: 126/85 146/81 142/84 146/84  Pulse: 88 84 87 85  Temp:  97.9 F (36.6 C) 98.1 F (36.7 C) 97.6 F (36.4 C)  TempSrc:  Oral Oral Oral  Resp: 20 20 20 20   Height:  5\' 7"  (1.702 m)    Weight:  120 kg (264 lb 8.8 oz) 120.1 kg (264 lb 12.4 oz)   SpO2: 97% 97% 95% 99%    Intake/Output Summary (Last 24 hours) at 08/10/12 1645 Last data filed at 08/10/12 1300  Gross per 24 hour  Intake   1243 ml  Output    750 ml  Net    493 ml   Filed Weights   08/09/12 1413 08/09/12 2103 08/10/12 0419  Weight: 120.657 kg (266 lb) 120 kg (264 lb 8.8 oz) 120.1 kg (264 lb 12.4 oz)    Exam:   General:  NAD  Cardiovascular: S1, S2 RRR  Respiratory: CTA B  Abdomen: soft, nt, nd, bs+  Musculoskeletal: 2+ pitting edema b/l  Data Reviewed: Basic Metabolic  Panel:  Recent Labs Lab 08/09/12 1500 08/10/12 0233  NA 134* 134*  K 4.1 3.5  CL 104 105  CO2 24 22  GLUCOSE 335* 219*  BUN 29* 28*  CREATININE 1.64* 1.63*  CALCIUM 8.2* 7.9*   Liver Function Tests:  Recent Labs Lab 08/10/12 0233  AST 15  ALT 16  ALKPHOS 62  BILITOT <0.1*  PROT 4.5*  ALBUMIN 1.3*   No results found for this basename: LIPASE, AMYLASE,  in the last 168 hours No results found for this basename: AMMONIA,  in the last 168 hours CBC:  Recent Labs Lab 08/09/12 1500 08/10/12 0233  WBC 7.3 6.4  NEUTROABS 5.0  --   HGB 11.6* 10.9*  HCT 34.5* 32.9*  MCV 89.1 89.6  PLT 339 328   Cardiac Enzymes:  Recent Labs Lab 08/09/12 1500 08/09/12 1709 08/09/12 2050 08/10/12 0233 08/10/12 0829  TROPONINI 0.30* 0.30* <0.30 <0.30 <0.30   BNP (last 3 results)  Recent Labs  08/09/12 1500 08/10/12 0233  PROBNP 4478.0* 3433.0*   CBG:  Recent Labs Lab 08/09/12 2109 08/10/12 0758 08/10/12 1113  GLUCAP 217* 105* 246*    No results found for this or any previous visit (from the past 240 hour(s)).   Studies: Dg Chest 2 View  08/09/2012  *RADIOLOGY REPORT*  Clinical Data: Edema.  Cough and congestion.  Short of breath.  CHEST - 2 VIEW  Comparison: Multiple priors, dating back to 07/31/2011.  Findings: Left lower lobectomy.  Chronic bronchitic changes and scarring at the medial right lung base.  Cardiopericardial silhouette appears within normal limits.  There is no airspace disease.  No pleural effusion. Monitoring leads are projected over the chest.  IMPRESSION: No acute cardiopulmonary disease.  Postoperative changes of left lower lobectomy.   Original Report Authenticated By: Andreas Newport, M.D.     Scheduled Meds: . aspirin EC  81 mg Oral Daily  . furosemide  80 mg Intravenous Q12H  . heparin  5,000 Units Subcutaneous Q8H  . insulin aspart  0-15 Units Subcutaneous TID WC  . insulin aspart  0-5 Units Subcutaneous QHS  . insulin detemir  40 Units  Subcutaneous QHS  . metoprolol succinate  25 mg Oral Daily  . sodium chloride  3 mL Intravenous Q12H   Continuous Infusions:   Principal Problem:   Anasarca Active Problems:   Diabetes mellitus   CAD (coronary artery disease)   CKD (chronic kidney disease), stage III   Elevated troponin    Time spent:    MEMON,JEHANZEB  [facility] Pager [phone]. If 7PM-7AM, please contact night-coverage at [email or web address], password [facility] 08/10/2012, 4:45 PM  LOS: 1 day

## 2012-08-10 NOTE — Progress Notes (Signed)
*  PRELIMINARY RESULTS* Echocardiogram 2D Echocardiogram has been performed.  Hannah [name] 08/10/2012, 2:48 PM  Veda Canning, RDCS

## 2012-08-10 NOTE — Progress Notes (Signed)
Quick Note:  LM for pt to call. ______ 

## 2012-08-10 NOTE — Consult Note (Signed)
Reason for Consult: Renal failure and anasarca Referring Physician: Dr Hannah [name] is an 42 y.o. female.  HPI: Patient with history of diabetes, lung cancer status post surgery, history of nephrotic syndrome presently came with complaints of swelling of her face and eyelids. According to the patient she try to see her primary care physician but office is closed a day hence decided to come to the emergency room. Patient also says that she has some chest pain which goes away without any problem. Patient has been increasing swelling the last 2 weeks. And she is being followed by Hannah. Hannah [name]. She was seen is end of March and that time her medications were kept the same. According the patient she has been on Lasix and lisinopril until the end of last month. Her lisinopril was stopped because of hypotension. She continues Lasix without much improvement.  Past Medical History  Diagnosis Date  . High cholesterol   . Diabetic retinopathy(362.0)   . Peripheral neuropathy     "tips of toes"  . Blind right eye   . Cardiomyopathy   . CHF (congestive heart failure)   . Cancer     Adenocarcinoma of lung  . CAD (coronary artery disease)     NO STENTS  . GERD (gastroesophageal reflux disease)   . Hypertension     under control with med., has been on med. x 1 yr.  . History of lung cancer 07/2011    s/p left lower lobectomy  . Diabetes mellitus     IDDM  . Runny nose 07/10/2012    clear drainage  . Cataract of right eye   . Ulcer of toe of right foot 07/10/2012    great toe  . Breast calcification, left 06/2012  . Nephrotic syndrome   . Nephrotic range proteinuria     Past Surgical History  Procedure Laterality Date  . Cardiac catheterization  07/16/2011  . Incision and drainage breast abscess Left   . Tubal ligation  1994  . Vitrectomy  2010    2 on left, 1 on right  . Cesarean section  1991; 1994  . Video assisted thoracoscopy (vats)/ lobectomy Left 07/30/2011    left main  thoracotomy, left lower lobectomy, mediastinal lymph node dissection  . Lobectomy      Family History  Problem Relation Age of Onset  . Coronary artery disease Father   . Asthma Father   . COPD Father   . Hypertension Father   . Hyperlipidemia Father   . Diabetes Father   . Congestive Heart Failure Father   . Hypertension Mother   . Hyperlipidemia Mother   . Diabetes Mother   . Cancer Maternal Aunt      three aunts, bone, breast, ?  . Hypertension Brother   . Diabetes Brother   . Diabetes Sister   . Colon cancer Neg Hx   . Lung cancer Maternal Grandmother   . Celiac disease Neg Hx   . Crohn's disease Neg Hx   . Ulcerative colitis Neg Hx     Social History:  reports that she quit smoking about a year ago. She has never used smokeless tobacco. She reports that she does not drink alcohol or use illicit drugs.  Allergies:  Allergies  Allergen Reactions  . Ciprofloxacin Rash    Medications: I have reviewed the patient's current medications.  Results for orders placed during the hospital encounter of 08/09/12 (from the past 48 hour(s))  BASIC METABOLIC PANEL  Status: Abnormal   Collection Time    08/09/12  3:00 PM      Result Value Range   Sodium 134 (*) 135 - 145 mEq/L   Potassium 4.1  3.5 - 5.1 mEq/L   Chloride 104  96 - 112 mEq/L   CO2 24  19 - 32 mEq/L   Glucose, Bld 335 (*) 70 - 99 mg/dL   BUN 29 (*) 6 - 23 mg/dL   Creatinine, Ser 1.61 (*) 0.50 - 1.10 mg/dL   Calcium 8.2 (*) 8.4 - 10.5 mg/dL   GFR calc non Af Amer 37 (*) >90 mL/min   GFR calc Af Amer 43 (*) >90 mL/min   Comment:            The eGFR has been calculated     using the CKD EPI equation.     This calculation has not been     validated in all clinical     situations.     eGFR's persistently     <90 mL/min signify     possible Chronic Kidney Disease.  CBC WITH DIFFERENTIAL     Status: Abnormal   Collection Time    08/09/12  3:00 PM      Result Value Range   WBC 7.3  4.0 - 10.5 K/uL   RBC  3.87  3.87 - 5.11 MIL/uL   Hemoglobin 11.6 (*) 12.0 - 15.0 g/dL   HCT 09.6 (*) 04.5 - 40.9 %   MCV 89.1  78.0 - 100.0 fL   MCH 30.0  26.0 - 34.0 pg   MCHC 33.6  30.0 - 36.0 g/dL   RDW 81.1  91.4 - 78.2 %   Platelets 339  150 - 400 K/uL   Neutrophils Relative 68  43 - 77 %   Neutro Abs 5.0  1.7 - 7.7 K/uL   Lymphocytes Relative 24  12 - 46 %   Lymphs Abs 1.8  0.7 - 4.0 K/uL   Monocytes Relative 4  3 - 12 %   Monocytes Absolute 0.3  0.1 - 1.0 K/uL   Eosinophils Relative 3  0 - 5 %   Eosinophils Absolute 0.2  0.0 - 0.7 K/uL   Basophils Relative 1  0 - 1 %   Basophils Absolute 0.0  0.0 - 0.1 K/uL  PRO B NATRIURETIC PEPTIDE     Status: Abnormal   Collection Time    08/09/12  3:00 PM      Result Value Range   Pro B Natriuretic peptide (BNP) 4478.0 (*) 0 - 125 pg/mL  TROPONIN I     Status: Abnormal   Collection Time    08/09/12  3:00 PM      Result Value Range   Troponin I 0.30 (*) <0.30 ng/mL   Comment:            Due to the release kinetics of cTnI,     a negative result within the first hours     of the onset of symptoms does not rule out     myocardial infarction with certainty.     If myocardial infarction is still suspected,     repeat the test at appropriate intervals.     CRITICAL RESULT CALLED TO, READ BACK BY AND VERIFIED WITH:     INGRAM,T ON 08/09/12 AT 1540 BY LOY,C  URINALYSIS, ROUTINE W REFLEX MICROSCOPIC     Status: Abnormal   Collection Time    08/09/12  3:48 PM  Result Value Range   Color, Urine YELLOW  YELLOW   APPearance CLEAR  CLEAR   Specific Gravity, Urine 1.020  1.005 - 1.030   pH 6.5  5.0 - 8.0   Glucose, UA >1000 (*) NEGATIVE mg/dL   Hgb urine dipstick MODERATE (*) NEGATIVE   Bilirubin Urine NEGATIVE  NEGATIVE   Ketones, ur NEGATIVE  NEGATIVE mg/dL   Protein, ur >098 (*) NEGATIVE mg/dL   Urobilinogen, UA 0.2  0.0 - 1.0 mg/dL   Nitrite NEGATIVE  NEGATIVE   Leukocytes, UA NEGATIVE  NEGATIVE  PREGNANCY, URINE     Status: None   Collection Time     08/09/12  3:48 PM      Result Value Range   Preg Test, Ur NEGATIVE  NEGATIVE   Comment:            THE SENSITIVITY OF THIS     METHODOLOGY IS >20 mIU/mL.  URINE MICROSCOPIC-ADD ON     Status: Abnormal   Collection Time    08/09/12  3:48 PM      Result Value Range   Squamous Epithelial / LPF FEW (*) RARE   WBC, UA 7-10  <3 WBC/hpf   RBC / HPF 0-2  <3 RBC/hpf   Bacteria, UA FEW (*) RARE  TROPONIN I     Status: Abnormal   Collection Time    08/09/12  5:09 PM      Result Value Range   Troponin I 0.30 (*) <0.30 ng/mL   Comment:            Due to the release kinetics of cTnI,     a negative result within the first hours     of the onset of symptoms does not rule out     myocardial infarction with certainty.     If myocardial infarction is still suspected,     repeat the test at appropriate intervals.     CRITICAL VALUE NOTED.  VALUE IS CONSISTENT WITH PREVIOUSLY REPORTED AND CALLED VALUE.  TROPONIN I     Status: None   Collection Time    08/09/12  8:50 PM      Result Value Range   Troponin I <0.30  <0.30 ng/mL   Comment:            Due to the release kinetics of cTnI,     a negative result within the first hours     of the onset of symptoms does not rule out     myocardial infarction with certainty.     If myocardial infarction is still suspected,     repeat the test at appropriate intervals.  GLUCOSE, CAPILLARY     Status: Abnormal   Collection Time    08/09/12  9:09 PM      Result Value Range   Glucose-Capillary 217 (*) 70 - 99 mg/dL  TSH     Status: None   Collection Time    08/09/12  9:30 PM      Result Value Range   TSH 1.090  0.350 - 4.500 uIU/mL  TROPONIN I     Status: None   Collection Time    08/10/12  2:33 AM      Result Value Range   Troponin I <0.30  <0.30 ng/mL   Comment:            Due to the release kinetics of cTnI,     a negative result within the first hours  of the onset of symptoms does not rule out     myocardial infarction with  certainty.     If myocardial infarction is still suspected,     repeat the test at appropriate intervals.  COMPREHENSIVE METABOLIC PANEL     Status: Abnormal   Collection Time    08/10/12  2:33 AM      Result Value Range   Sodium 134 (*) 135 - 145 mEq/L   Potassium 3.5  3.5 - 5.1 mEq/L   Chloride 105  96 - 112 mEq/L   CO2 22  19 - 32 mEq/L   Glucose, Bld 219 (*) 70 - 99 mg/dL   BUN 28 (*) 6 - 23 mg/dL   Creatinine, Ser 9.14 (*) 0.50 - 1.10 mg/dL   Calcium 7.9 (*) 8.4 - 10.5 mg/dL   Total Protein 4.5 (*) 6.0 - 8.3 g/dL   Albumin 1.3 (*) 3.5 - 5.2 g/dL   AST 15  0 - 37 U/L   ALT 16  0 - 35 U/L   Alkaline Phosphatase 62  39 - 117 U/L   Total Bilirubin <0.1 (*) 0.3 - 1.2 mg/dL   GFR calc non Af Amer 37 (*) >90 mL/min   GFR calc Af Amer 43 (*) >90 mL/min   Comment:            The eGFR has been calculated     using the CKD EPI equation.     This calculation has not been     validated in all clinical     situations.     eGFR's persistently     <90 mL/min signify     possible Chronic Kidney Disease.  CBC     Status: Abnormal   Collection Time    08/10/12  2:33 AM      Result Value Range   WBC 6.4  4.0 - 10.5 K/uL   RBC 3.67 (*) 3.87 - 5.11 MIL/uL   Hemoglobin 10.9 (*) 12.0 - 15.0 g/dL   HCT 78.2 (*) 95.6 - 21.3 %   MCV 89.6  78.0 - 100.0 fL   MCH 29.7  26.0 - 34.0 pg   MCHC 33.1  30.0 - 36.0 g/dL   RDW 08.6  57.8 - 46.9 %   Platelets 328  150 - 400 K/uL  PRO B NATRIURETIC PEPTIDE     Status: Abnormal   Collection Time    08/10/12  2:33 AM      Result Value Range   Pro B Natriuretic peptide (BNP) 3433.0 (*) 0 - 125 pg/mL  GLUCOSE, CAPILLARY     Status: Abnormal   Collection Time    08/10/12  7:58 AM      Result Value Range   Glucose-Capillary 105 (*) 70 - 99 mg/dL   Comment 1 Notify RN     Comment 2 Documented in Chart    TROPONIN I     Status: None   Collection Time    08/10/12  8:29 AM      Result Value Range   Troponin I <0.30  <0.30 ng/mL   Comment:             Due to the release kinetics of cTnI,     a negative result within the first hours     of the onset of symptoms does not rule out     myocardial infarction with certainty.     If myocardial infarction is still suspected,     repeat  the test at appropriate intervals.  GLUCOSE, CAPILLARY     Status: Abnormal   Collection Time    08/10/12 11:13 AM      Result Value Range   Glucose-Capillary 246 (*) 70 - 99 mg/dL   Comment 1 Notify RN     Comment 2 Documented in Chart      Dg Chest 2 View  08/09/2012  *RADIOLOGY REPORT*  Clinical Data: Edema.  Cough and congestion.  Short of breath.  CHEST - 2 VIEW  Comparison: Multiple priors, dating back to 07/31/2011.  Findings: Left lower lobectomy.  Chronic bronchitic changes and scarring at the medial right lung base.  Cardiopericardial silhouette appears within normal limits.  There is no airspace disease.  No pleural effusion. Monitoring leads are projected over the chest.  IMPRESSION: No acute cardiopulmonary disease.  Postoperative changes of left lower lobectomy.   Original Report Authenticated By: Andreas Newport, M.D.     Review of Systems  Respiratory: Positive for shortness of breath.   Cardiovascular: Positive for chest pain and leg swelling.  Gastrointestinal: Negative for nausea and vomiting.  Musculoskeletal: Positive for back pain.   Blood pressure 146/84, pulse 85, temperature 97.6 F (36.4 C), temperature source Oral, resp. rate 20, height 5\' 7"  (1.702 m), weight 120.1 kg (264 lb 12.4 oz), last menstrual period 08/06/2012, SpO2 99.00%. Physical Exam  Constitutional: She is oriented to person, place, and time.  Eyes: No scleral icterus.  She facial puffiness and eye lid swelling  Neck: No JVD present.  Cardiovascular: Normal rate and regular rhythm.   Respiratory: She has no wheezes. She has no rales.  Decreased breath sound bilaterally  GI: She exhibits no distension. There is no tenderness.  Musculoskeletal: She exhibits edema.   Neurological: She is alert and oriented to person, place, and time.    Assessment/Plan: Problem #1 renal failure seems to be chronic etiology as this moment not clear possibly secondary to diabetic nephropathy. Problem #2 nephrotic syndrome: Patient had a workup last year including ANA which was negative, complement is normal, hepatitis surface antigen is negative, hepatitis C surface antibody was also negative. Both anti glomerular basement membrane antibody and ANCA  was negative. The etiology for her nephrotic syndrome was thought to be secondary to diabetes versus secondary to membranous nephropathy due to her lung cancer. She had about 10 g of protein during that time. 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 Since patient has workup from before presently would not do any further workup except checking her protein to creatinine ratio to see if there is any improvement in her proteinuria.  Hannah [name] S 08/10/2012, 4:57 PM

## 2012-08-10 NOTE — Progress Notes (Signed)
Inpatient Diabetes Program Recommendations  AACE/ADA: New Consensus Statement on Inpatient Glycemic Control (2013)  Target Ranges:  Prepandial:   less than 140 mg/dL      Peak postprandial:   less than 180 mg/dL (1-2 hours)      Critically ill patients:  140 - 180 mg/dL   Results for FERRIS, TALLY (MRN [id number]) as of 08/10/2012 12:21  Ref. Range 08/09/2012 21:09 08/10/2012 07:58 08/10/2012 11:13  Glucose-Capillary Latest Range: 70-99 mg/dL 147 (H) 829 (H) 562 (H)    Inpatient Diabetes Program Recommendations Insulin - Meal Coverage: May want to consider ordering Novolog 3 units TID meal coverage as long as the patient eats at least 50% of meal.  Note: Patient has a history of diabetes and takes Levemir 40 units QHS and Humalog 5 units with each meal at home for diabetes management.  Currently, patient is ordered to receive Levemir 40 units QHS, Novolog 0-15 AC, and Novolog 0-5 at bedtime for inpatient glycemic control.  Fasting glucose this morning was 105 mg/dl.  However, post-prandial glucose elevated.  May want to consider ordering Novolog 3 units TID meal coverage as long as the patient eats at least 50% of meal.  Will continue to follow.  Thanks, Orlando Penner, RN, BSN, CCRN Diabetes Coordinator Inpatient Diabetes Program [phone]

## 2012-08-11 DIAGNOSIS — N049 Nephrotic syndrome with unspecified morphologic changes: Secondary | ICD-10-CM

## 2012-08-11 LAB — CREATININE, URINE, 24 HOUR
Collection Interval-UCRE24: 24 hours
Urine Total Volume-UCRE24: 4475 mL

## 2012-08-11 LAB — PHOSPHORUS: Phosphorus: 4.7 mg/dL — ABNORMAL HIGH (ref 2.3–4.6)

## 2012-08-11 LAB — GLUCOSE, CAPILLARY
Glucose-Capillary: 121 mg/dL — ABNORMAL HIGH (ref 70–99)
Glucose-Capillary: 250 mg/dL — ABNORMAL HIGH (ref 70–99)
Glucose-Capillary: 235 mg/dL — ABNORMAL HIGH (ref 70–99)

## 2012-08-11 LAB — PROTEIN, URINE, 24 HOUR
Collection Interval-UPROT: 24 hours
Protein, Urine: 402 mg/dL
Urine Total Volume-UPROT: 4475 mL

## 2012-08-11 LAB — BASIC METABOLIC PANEL
BUN: 28 mg/dL — ABNORMAL HIGH (ref 6–23)
CO2: 28 mEq/L (ref 19–32)
Calcium: 8.2 mg/dL — ABNORMAL LOW (ref 8.4–10.5)
GFR calc non Af Amer: 30 mL/min — ABNORMAL LOW (ref >90)
Glucose, Bld: 158 mg/dL — ABNORMAL HIGH (ref 70–99)
Potassium: 3.9 mEq/L (ref 3.5–5.1)

## 2012-08-11 LAB — URINE CULTURE: Colony Count: NO GROWTH

## 2012-08-11 LAB — CBC
Hemoglobin: 11.6 g/dL — ABNORMAL LOW (ref 12.0–15.0)
MCH: 29.7 pg (ref 26.0–34.0)
MCHC: 33.1 g/dL (ref 30.0–36.0)
MCV: 89.7 fL (ref 78.0–100.0)

## 2012-08-11 LAB — PROTEIN / CREATININE RATIO, URINE: Creatinine, Urine: 35.14 mg/dL

## 2012-08-11 MED ORDER — TORSEMIDE 20 MG PO TABS
40.0000 mg | ORAL_TABLET | Freq: Every day | ORAL | Status: DC
  Administered 2012-08-11 – 2012-08-12 (×2): 40 mg via ORAL
  Filled 2012-08-11: qty 1
  Filled 2012-08-11 (×2): qty 2

## 2012-08-11 NOTE — Progress Notes (Signed)
Subjective: Interval History: has no complaint of difficulty in breathing. Patient overall seems to be feeling better is willing also has improved..  Objective: Vital signs in last 24 hours: Temp:  [97.6 F (36.4 C)-97.9 F (36.6 C)] 97.9 F (36.6 C) (04/25 0447) Pulse Rate:  [85-88] 88 (04/25 0447) Resp:  [20] 20 (04/25 0447) BP: (110-146)/(74-84) 110/79 mmHg (04/25 0447) SpO2:  [99 %] 99 % (04/25 0447) Weight:  [119.9 kg (264 lb 5.3 oz)] 119.9 kg (264 lb 5.3 oz) (04/25 0447) Weight change: -0.757 kg (-1 lb 10.7 oz)  Intake/Output from previous day: 04/24 0701 - 04/25 0700 In: 1603 [P.O.:1600; I.V.:3] Out: 2100 [Urine:2100] Intake/Output this shift:    General appearance: alert, cooperative and no distress Resp: clear to auscultation bilaterally Cardio: regular rate and rhythm, S1, S2 normal, no murmur, click, rub or gallop GI: soft, non-tender; bowel sounds normal; no masses,  no organomegaly Extremities: edema 1+ edema  Lab Results:  Recent Labs  08/10/12 0233 08/11/12 0511  WBC 6.4 5.9  HGB 10.9* 11.6*  HCT 32.9* 35.0*  PLT 328 331   BMET:  Recent Labs  08/10/12 0233 08/11/12 0511  NA 134* 135  K 3.5 3.9  CL 105 102  CO2 22 28  GLUCOSE 219* 158*  BUN 28* 28*  CREATININE 1.63* 1.98*  CALCIUM 7.9* 8.2*   No results found for this basename: PTH,  in the last 72 hours Iron Studies: No results found for this basename: IRON, TIBC, TRANSFERRIN, FERRITIN,  in the last 72 hours  Studies/Results: Dg Chest 2 View  08/09/2012  *RADIOLOGY REPORT*  Clinical Data: Edema.  Cough and congestion.  Short of breath.  CHEST - 2 VIEW  Comparison: Multiple priors, dating back to 07/31/2011.  Findings: Left lower lobectomy.  Chronic bronchitic changes and scarring at the medial right lung base.  Cardiopericardial silhouette appears within normal limits.  There is no airspace disease.  No pleural effusion. Monitoring leads are projected over the chest.  IMPRESSION: No acute  cardiopulmonary disease.  Postoperative changes of left lower lobectomy.   Original Report Authenticated By: Andreas Newport, M.D.     {medication reviewed/display:[id number]  Assessment/Plan: Problem #1 renal failure chronic her BUN and creatinine at this moment seems to be increasing. This could be secondary to Cozaar versus secondary to for removal. Problem #2 nephrotic syndrome patient was previously on ACE inhibitor that was discontinued. She started on Cozaar as stated above creatinine seems to be increasing. Problem #3 hypertension her blood pressure seems to be reasonably controlled Problem #4 diabetes Problem #5 carotid disease Problem #6 diastolic dysfunction patient presently on Demadex was good urine output and her edema seems to be improving. Problem #7 hyperlipidemia Problem #8 history of lung CA Plan: We'll DC Cozaar Change Demadex 40 mg once a day I have discussed with the patient that she need to call her primary nephrologist and make an appointment as early as possible so that they can adjust her diuretics and consider possibility of starting her on ACE inhibitor.   LOS: 2 days   Honey Zakarian S 08/11/2012,8:05 AM

## 2012-08-11 NOTE — Progress Notes (Signed)
[facility] PROGRESS NOTE  Hannah [name] ZOX:[id number] DOB: 1971-03-29 DOA: 08/09/2012 PCP: Milinda Antis, MD  Assessment/Plan: Anasarca.  Likely due to nephrotic syndrome.  Appreciate nephrology assistance.  Started on ARB as well as diuretics changed to demadex. She has had good urine output. Creatinine trending up today so ARB discontinued. Will follow up creatinine in am.  Elevated troponin and abnormal EKG.  Appreciate cardiology assistance. Recommendations are for outpatient stress test.  She has an appointment with Dr. Jens Som soon and will discuss this with him.  1. CKD 3.  Mild bump in creatinine due to diuretics/ARB.  Continue to follow. 2. Diabetes, type 2.  Continue with sliding scale 3. History of lung cancer.  S/p resection.  Did not undergo any chemo/radiation in the past.  Code Status: full code Family Communication: discussed with patient and family at the bedside Disposition Plan: discharge home once improved, possibly tomorrow if creatinine stable.   Consultants:  [facility] Cardiology  Nephrology  Procedures:  none  Antibiotics:  none  HPI/Subjective: Feels significantly improved today.  Has had good urine output.  Swelling improved.  Objective: Filed Vitals:   08/10/12 1344 08/10/12 2040 08/11/12 0447 08/11/12 1054  BP: 146/84 128/74 110/79 126/82  Pulse: 85 86 88 92  Temp: 97.6 F (36.4 C) 97.9 F (36.6 C) 97.9 F (36.6 C)   TempSrc: Oral Oral Oral   Resp: 20 20 20    Height:      Weight:   119.9 kg (264 lb 5.3 oz)   SpO2: 99% 99% 99% 98%    Intake/Output Summary (Last 24 hours) at 08/11/12 1508 Last data filed at 08/11/12 0447  Gross per 24 hour  Intake    600 ml  Output   2100 ml  Net  -1500 ml   Filed Weights   08/09/12 2103 08/10/12 0419 08/11/12 0447  Weight: 120 kg (264 lb 8.8 oz) 120.1 kg (264 lb 12.4 oz) 119.9 kg (264 lb 5.3 oz)    Exam:   General:  NAD  Cardiovascular: S1, S2 RRR  Respiratory: CTA  B  Abdomen: soft, nt, nd, bs+  Musculoskeletal: no edema b/l  Data Reviewed: Basic Metabolic Panel:  Recent Labs Lab 08/09/12 1500 08/10/12 0233 08/11/12 0511  NA 134* 134* 135  K 4.1 3.5 3.9  CL 104 105 102  CO2 24 22 28   GLUCOSE 335* 219* 158*  BUN 29* 28* 28*  CREATININE 1.64* 1.63* 1.98*  CALCIUM 8.2* 7.9* 8.2*  PHOS  --   --  4.7*   Liver Function Tests:  Recent Labs Lab 08/10/12 0233  AST 15  ALT 16  ALKPHOS 62  BILITOT <0.1*  PROT 4.5*  ALBUMIN 1.3*   No results found for this basename: LIPASE, AMYLASE,  in the last 168 hours No results found for this basename: AMMONIA,  in the last 168 hours CBC:  Recent Labs Lab 08/09/12 1500 08/10/12 0233 08/11/12 0511  WBC 7.3 6.4 5.9  NEUTROABS 5.0  --   --   HGB 11.6* 10.9* 11.6*  HCT 34.5* 32.9* 35.0*  MCV 89.1 89.6 89.7  PLT 339 328 331   Cardiac Enzymes:  Recent Labs Lab 08/09/12 1500 08/09/12 1709 08/09/12 2050 08/10/12 0233 08/10/12 0829  TROPONINI 0.30* 0.30* <0.30 <0.30 <0.30   BNP (last 3 results)  Recent Labs  08/09/12 1500 08/10/12 0233  PROBNP 4478.0* 3433.0*   CBG:  Recent Labs Lab 08/10/12 1113 08/10/12 1706 08/10/12 2106 08/11/12 0738 08/11/12 1144  GLUCAP 246* 209*  313* 121* 243*    Recent Results (from the past 240 hour(s))  URINE CULTURE     Status: None   Collection Time    08/09/12  3:48 PM      Result Value Range Status   Specimen Description URINE, CLEAN CATCH   Final   Special Requests NONE   Final   Culture  Setup Time 08/10/2012 02:43   Final   Colony Count NO GROWTH   Final   Culture NO GROWTH   Final   Report Status 08/11/2012 FINAL   Final     Studies: Dg Chest 2 View  08/09/2012  *RADIOLOGY REPORT*  Clinical Data: Edema.  Cough and congestion.  Short of breath.  CHEST - 2 VIEW  Comparison: Multiple priors, dating back to 07/31/2011.  Findings: Left lower lobectomy.  Chronic bronchitic changes and scarring at the medial right lung base.   Cardiopericardial silhouette appears within normal limits.  There is no airspace disease.  No pleural effusion. Monitoring leads are projected over the chest.  IMPRESSION: No acute cardiopulmonary disease.  Postoperative changes of left lower lobectomy.   Original Report Authenticated By: Andreas Newport, M.D.     Scheduled Meds: . aspirin EC  81 mg Oral Daily  . heparin  5,000 Units Subcutaneous Q8H  . insulin aspart  0-15 Units Subcutaneous TID WC  . insulin aspart  0-5 Units Subcutaneous QHS  . insulin detemir  40 Units Subcutaneous QHS  . metoprolol succinate  25 mg Oral Daily  . sodium chloride  3 mL Intravenous Q12H  . torsemide  40 mg Oral Daily   Continuous Infusions:   Active Problems:   Nephrotic syndrome   Diabetes mellitus   Arteriosclerotic cardiovascular disease (ASCVD)   CKD (chronic kidney disease), stage III    Time spent:    MEMON,JEHANZEB  [facility] Pager [phone]. If 7PM-7AM, please contact night-coverage at [email or web address], password [facility] 08/11/2012, 3:08 PM  LOS: 2 days

## 2012-08-11 NOTE — Progress Notes (Signed)
Patient reported pain at IV site last night around 2300. IV was then removed. IV site bruised. Hospitalist stated it is fine for patient to stay without an IV site. No further concerns from patient. Will continue to monitor.

## 2012-08-11 NOTE — Progress Notes (Signed)
24 hour urine started at 1900, 4/24.

## 2012-08-11 NOTE — Progress Notes (Signed)
SUBJECTIVE:Feeling much better. Less swollen.  Active Problems:   Nephrotic syndrome   Diabetes mellitus   Arteriosclerotic cardiovascular disease (ASCVD)   CKD (chronic kidney disease), stage III  LABS: Basic Metabolic Panel:  Recent Labs  16/10/96 0233 08/11/12 0511  NA 134* 135  K 3.5 3.9  CL 105 102  CO2 22 28  GLUCOSE 219* 158*  BUN 28* 28*  CREATININE 1.63* 1.98*  CALCIUM 7.9* 8.2*  PHOS  --  4.7*   Liver Function Tests:  Recent Labs  08/10/12 0233  AST 15  ALT 16  ALKPHOS 62  BILITOT <0.1*  PROT 4.5*  ALBUMIN 1.3*   CBC:  Recent Labs  08/09/12 1500 08/10/12 0233 08/11/12 0511  WBC 7.3 6.4 5.9  NEUTROABS 5.0  --   --   HGB 11.6* 10.9* 11.6*  HCT 34.5* 32.9* 35.0*  MCV 89.1 89.6 89.7  PLT 339 328 331   Cardiac Enzymes:  Recent Labs  08/09/12 2050 08/10/12 0233 08/10/12 0829  TROPONINI <0.30 <0.30 <0.30   Thyroid Function Tests:  Recent Labs  08/09/12 2130  TSH 1.090   RADIOLOGY: Dg Chest 2 View  08/09/2012  *RADIOLOGY REPORT*  Clinical Data: Edema.  Cough and congestion.  Short of breath.  CHEST - 2 VIEW  Comparison: Multiple priors, dating back to 07/31/2011.  Findings: Left lower lobectomy.  Chronic bronchitic changes and scarring at the medial right lung base.  Cardiopericardial silhouette appears within normal limits.  There is no airspace disease.  No pleural effusion. Monitoring leads are projected over the chest.  IMPRESSION: No acute cardiopulmonary disease.  Postoperative changes of left lower lobectomy.   Original Report Authenticated By: Andreas Newport, M.D.    PHYSICAL EXAM BP 110/79  Pulse 88  Temp(Src) 97.9 F (36.6 C) (Oral)  Resp 20  Ht 5\' 7"  (1.702 m)  Wt 264 lb 5.3 oz (119.9 kg)  BMI 41.39 kg/m2  SpO2 99%  LMP 08/06/2012 General: Well developed, well nourished, in no acute distress Head: Eyes PERRLA, No xanthomas.   Normal cephalic and atramatic  Lungs: Clear bilaterally to auscultation and percussion. Heart:  HRRR S1 S2, No MRG .  Pulses are 2+ & equal.            No carotid bruit. No JVD.  No abdominal bruits. No femoral bruits. Abdomen: Bowel sounds are positive, abdomen soft and non-tender without masses or                  Hernia's noted. Msk:  Back normal, normal gait. Normal strength and tone for age. Extremities: No clubbing, cyanosis.  DP +1; 1/2+ edema Neuro: Alert and oriented X 3. Psych:  Good affect, responds appropriately  TELEMETRY: Reviewed telemetry pt in: NSR 80's ASSESSMENT AND PLAN:  1. Anasarca: Almost complete resolution of symptoms and edema.  Most likely related to nephrotic syndrome. 24 hour urine in progress.  2. CAD: Moderate 3 vessel disease with preserved LV fx.Some EKG changes were noted on admission with ST-T wave changes infero/laterally.  Repeat EKG this am. Continue BB and ASA.  3. History of Nephrotic Syndrome: Followed by Dr.Befakadu. ACE has been discontinued in favor of Cozaar.. 24 hour urine in progress.    Bettey Mare. Lyman Bishop NP [facility] 08/11/2012, 9:50 AM  Cardiology Attending Patient interviewed and examined. Discussed with Joni Reining, NP.  Above note annotated and modified based upon my findings.  Marked improvement with initial diuresis despite I&O indicating only a 1 L fluid loss  and no change in weight.  Nonetheless, there is been some deterioration in renal function. Dr. Kristian Covey is managing. No further cardiac testing is necessary nor any change in cardiac medication.  [place] Bing, MD 08/11/2012, 5:09 PM

## 2012-08-11 NOTE — Progress Notes (Signed)
Inpatient Diabetes Program Recommendations  AACE/ADA: New Consensus Statement on Inpatient Glycemic Control (2013)  Target Ranges:  Prepandial:   less than 140 mg/dL      Peak postprandial:   less than 180 mg/dL (1-2 hours)      Critically ill patients:  140 - 180 mg/dL  Results for Hannah [name], Hannah [name] (MRN [id number]) as of 08/11/2012 09:02  Ref. Range 08/10/2012 07:58 08/10/2012 11:13 08/10/2012 17:06 08/10/2012 21:06 08/11/2012 07:38  Glucose-Capillary Latest Range: 70-99 mg/dL 952 (H) 841 (H) 324 (H) 313 (H) 121 (H)  Results for Hannah [name], Hannah [name] (MRN [id number]) as of 08/11/2012 09:02  Ref. Range 08/09/2012 21:30  Hemoglobin A1C Latest Range: <5.7 % 12.0 (H)    Inpatient Diabetes Program Recommendations Insulin - Meal Coverage:Please consider ordering Novolog 5 units TID meal coverage as long as the patient eats at least 50% of meal.   Note: Patient has a history of diabetes and takes Levemir 40 units QHS and Humalog 5 units with each meal at home for diabetes management.  Currently, patient is ordered to receive Levemir 40 units QHS, Novolog 0-15 AC, and Novolog 0-5 at bedtime for inpatient glycemic control.  Fasting glucose this morning was 121 mg/dl.  However, post-prandial glucose is consistently elevated. Please consider ordering Novolog 5 units TID meal coverage as long as the patient eats at least 50% of meal.  Will continue to follow.    Thanks,  Orlando Penner, RN, BSN, CCRN  Diabetes Coordinator  Inpatient Diabetes Program  [phone]

## 2012-08-12 LAB — BASIC METABOLIC PANEL
BUN: 32 mg/dL — ABNORMAL HIGH (ref 6–23)
Chloride: 104 mEq/L (ref 96–112)
Creatinine, Ser: 2.05 mg/dL — ABNORMAL HIGH (ref 0.50–1.10)
GFR calc Af Amer: 33 mL/min — ABNORMAL LOW (ref >90)
GFR calc non Af Amer: 28 mL/min — ABNORMAL LOW (ref >90)
Potassium: 4.4 mEq/L (ref 3.5–5.1)

## 2012-08-12 LAB — GLUCOSE, CAPILLARY: Glucose-Capillary: 89 mg/dL (ref 70–99)

## 2012-08-12 MED ORDER — TORSEMIDE 20 MG PO TABS: 40.0000 mg | ORAL_TABLET | Freq: Every day | ORAL | Status: DC

## 2012-08-12 NOTE — Discharge Summary (Addendum)
Physician Discharge Summary  Hannah [name]:[id number] DOB: Jul 27, 1970 DOA: 08/09/2012  PCP: Milinda Antis, MD  Admit date: 08/09/2012 Discharge date: 08/12/2012  Time spent: 35 minutes  Recommendations for Outpatient Follow-up:  1. Followup with primary nephrologist in the coming week to discuss starting ARB and possible kidney biopsy. 2. Followup with primary cardiologist to discuss further cardiac testing is necessary. 3. Followup primary care physician in 2 weeks  Discharge Diagnoses:  Active Problems:   Nephrotic syndrome   Diabetes mellitus   Arteriosclerotic cardiovascular disease (ASCVD)   Acute kidney injury on CKD (chronic kidney disease), stage III   Discharge Condition: Improved  Diet recommendation: Low salt, low carb  Filed Weights   08/09/12 2103 08/10/12 0419 08/11/12 0447  Weight: 120 kg (264 lb 8.8 oz) 120.1 kg (264 lb 12.4 oz) 119.9 kg (264 lb 5.3 oz)    History of present illness:  Hannah [name] is a 42 y.o. female with a past medical history of for diabetes, congestive heart failure, lung cancer s/p surgery, chronic kidney disease, diabetic retinopathy, who was in her usual state of health about a week ago when she started noticing that she had worsening swelling in her legs. Subsequently, the swelling started showing up everywhere. Her abdomen started swelling and her face started swelling. Denies any recent new medication. She did mention that her dose of metoprolol was decreased about a couple weeks ago due to drop in blood pressure. She has symptoms consistent with orthopnea and PND. She started having chest pain on the left side about 30 minutes ago, 1 out of 10. It feels like a twinge. Denies any radiation of the pain. Has had some nausea without any vomiting. Has gained about 20 pounds in the last 2 weeks. Denies any dizziness. No palpitations. She has been compliant with her medications. Denies any dietary indiscretion. No cough. No fevers. No  shortness of breath otherwise. She last saw her cardiologist 6 months ago, and saw her nephrologist couple weeks ago.   Hospital Course:  This lady was admitted to the hospital for generalized edema and anasarca. He does not have significant proteinuria consistent with nephrotic syndrome. She is followed by a nephrologist an outpatient. Creatinine was only 1.6 at baseline. She was started on diuretics. She was seen by nephrology medications were adjusted. She is significant improvement in her volume overload. Her edema has resolved. She was started on ARB but had increase in her creatinine to 1.9. This was subsequently discontinued. It was recommended she should followup with her primary nephrologist to discuss initiation of therapy and possible kidney biopsy to determine etiology of nephrotic syndrome. The patient also did have some chest pain. She was seen by cardiology and it was felt that her volume overload was not due to heart failure. Echocardiogram showed a normal LV function. She will followup with her primary cardiologist to discuss any further cardiac testing if needed. Currently the patient feels back to baseline and is ready for discharge home.  Procedures:  None  Consultations:  Nephrology  Cardiology  Discharge Exam: Filed Vitals:   08/11/12 1054 08/11/12 2141 08/11/12 2217 08/12/12 0500  BP: 126/82 151/86 129/66 109/70  Pulse: 92 85 83 86  Temp:  98.1 F (36.7 C) 97.6 F (36.4 C) 97.7 F (36.5 C)  TempSrc:  Oral Oral Oral  Resp:  20 20 18   Height:      Weight:      SpO2: 98% 97% 97% 96%    General: No acute  distress Cardiovascular: S1, S2, regular rate and rhythm Respiratory: Clear to auscultation bilaterally  Discharge Instructions  Discharge Orders   Future Appointments Provider Department Dept Phone   08/17/2012 1:00 PM Salley Scarlet, MD [facility] [phone]   08/21/2012 1:00 PM [facility]  [phone]   08/21/2012 2:15 PM Nada Libman, MD [facility] [phone]   Future Orders Complete By Expires     Call MD for:  difficulty breathing, headache or visual disturbances  As directed     Call MD for:  As directed     Comments:      Weight gain or worsening swelling    Diet - low sodium heart healthy  As directed     Diet Carb Modified  As directed     Increase activity slowly  As directed         Medication List    STOP taking these medications       furosemide 40 MG tablet  Commonly known as:  LASIX      TAKE these medications       insulin detemir 100 UNIT/ML injection  Commonly known as:  LEVEMIR  Inject 40 Units into the skin at bedtime.     insulin lispro 100 UNIT/ML injection  Commonly known as:  HUMALOG  Inject 5 units subcutaneously with each meal.     metoprolol succinate 25 MG 24 hr tablet  Commonly known as:  TOPROL XL  Take 1 tablet (25 mg total) by mouth daily.     polyethylene glycol-electrolytes 420 G solution  Commonly known as:  TRILYTE  Take 4,000 mLs by mouth as directed.     ranitidine 150 MG tablet  Commonly known as:  ZANTAC  TAKE ONE TABLET BY MOUTH EVERY DAY AT BEDTIME     torsemide 20 MG tablet  Commonly known as:  DEMADEX  Take 2 tablets (40 mg total) by mouth daily.           Follow-up Information   Follow up with Milinda Antis, MD. Schedule an appointment as soon as possible for a visit in 2 weeks.   Contact information:   [address] [place] [address] [phone]       Follow up with COLADONATO,JOSEPH A, MD. (call on monday for appointment this week)    Contact information:   [address] [facility] [place] [address] [phone]       Follow up with Olga Millers, MD. Schedule an appointment as soon as possible for a visit in 2 weeks.   Contact information:   [address] [place], [address]                         [address] [place]  [address] [phone]        The results of significant diagnostics from this hospitalization (including imaging, microbiology, ancillary and laboratory) are listed below for reference.    Significant Diagnostic Studies: Dg Chest 2 View  08/09/2012  *RADIOLOGY REPORT*  Clinical Data: Edema.  Cough and congestion.  Short of breath.  CHEST - 2 VIEW  Comparison: Multiple priors, dating back to 07/31/2011.  Findings: Left lower lobectomy.  Chronic bronchitic changes and scarring at the medial right lung base.  Cardiopericardial silhouette appears within normal limits.  There is no airspace disease.  No pleural effusion. Monitoring leads are projected over the chest.  IMPRESSION: No acute cardiopulmonary disease.  Postoperative changes of left lower lobectomy.   Original Report Authenticated By: Andreas Newport, M.D.    Dg Chest 2 View  07/28/2012  *RADIOLOGY REPORT*  Clinical Data: Cough, fever, prior left lower lobe lung cancer  CHEST - 2 VIEW  Comparison: 02/09/2012 and several other prior exams  Findings: Ill-defined increased right infrahilar opacity medially along the right cardiac angle.  Developing atelectasis or airspace process in this region is not entirely excluded when compared to the prior exams.  Left lung remains clear.  No effusion or pneumothorax.  No edema or CHF.  Normal heart size and vascularity. Trachea is midline.  IMPRESSION: Increased ill-defined right infrahilar opacity medially, atelectasis versus developing pneumonia not excluded.  Recommend radiographic follow-up to document resolution.   Original Report Authenticated By: Judie Petit. Miles Costain, M.D.    Ct Chest Wo Contrast  08/02/2012  *RADIOLOGY REPORT*  Clinical Data: Lung cancer with lobectomy.  Possible pneumonia on recent chest radiograph.  CT CHEST WITHOUT CONTRAST  Technique:  Multidetector CT imaging of the chest was performed following the standard protocol without IV contrast.  Comparison: 07/28/2012.  Findings: Postoperative  changes of left lower lobectomy.  Scarring is present in the right middle lobe accounting for opacity seen on prior chest radiograph.  No airspace disease or post infectious/inflammatory changes are identified.  The age advanced the aortic and coronary artery atherosclerosis.  There is no axillary adenopathy.  No mediastinal or hilar adenopathy.  No effusion.  Incidental imaging the upper abdomen demonstrates dependently layering calcified gallstones.  No aggressive osseous lesions. The central airways are patent.  IMPRESSION: 1.  Left lower lobectomy.  No evidence of recurrent or metastatic disease. 2.  Opacity on prior chest radiograph is compatible with scarring in the right middle lobe adjacent to the mediastinum. 3.  No active cardiopulmonary disease.   Original Report Authenticated By: Andreas Newport, M.D.     Microbiology: Recent Results (from the past 240 hour(s))  URINE CULTURE     Status: None   Collection Time    08/09/12  3:48 PM      Result Value Range Status   Specimen Description URINE, CLEAN CATCH   Final   Special Requests NONE   Final   Culture  Setup Time 08/10/2012 02:43   Final   Colony Count NO GROWTH   Final   Culture NO GROWTH   Final   Report Status 08/11/2012 FINAL   Final     Labs: Basic Metabolic Panel:  Recent Labs Lab 08/09/12 1500 08/10/12 0233 08/11/12 0511 08/12/12 0635  NA 134* 134* 135 137  K 4.1 3.5 3.9 4.4  CL 104 105 102 104  CO2 24 22 28 28   GLUCOSE 335* 219* 158* 101*  BUN 29* 28* 28* 32*  CREATININE 1.64* 1.63* 1.98* 2.05*  CALCIUM 8.2* 7.9* 8.2* 8.1*  PHOS  --   --  4.7*  --    Liver Function Tests:  Recent Labs Lab 08/10/12 0233  AST 15  ALT 16  ALKPHOS 62  BILITOT <0.1*  PROT 4.5*  ALBUMIN 1.3*   No results found for this basename: LIPASE, AMYLASE,  in the last 168 hours No results found for this basename: AMMONIA,  in the last 168 hours CBC:  Recent Labs Lab 08/09/12 1500 08/10/12 0233 08/11/12 0511  WBC 7.3 6.4 5.9   NEUTROABS 5.0  --   --   HGB 11.6* 10.9* 11.6*  HCT 34.5* 32.9* 35.0*  MCV 89.1  89.6 89.7  PLT 339 328 331   Cardiac Enzymes:  Recent Labs Lab 08/09/12 1500 08/09/12 1709 08/09/12 2050 08/10/12 0233 08/10/12 0829  TROPONINI 0.30* 0.30* <0.30 <0.30 <0.30   BNP: BNP (last 3 results)  Recent Labs  08/09/12 1500 08/10/12 0233  PROBNP 4478.0* 3433.0*   CBG:  Recent Labs Lab 08/11/12 1144 08/11/12 1626 08/11/12 2138 08/12/12 0734 08/12/12 1141  GLUCAP 243* 250* 235* 89 170*       Signed:  Devaun Hernandez  [facility] 08/12/2012, 2:00 PM

## 2012-08-12 NOTE — Progress Notes (Signed)
Hannah [name]  MRN: [id number]  DOB/AGE: 44-Feb-1972 44 y.o.  Primary Care Physician:Gruver, Hannah Grandchild, MD  Admit date: 08/09/2012  Chief Complaint:  Chief Complaint  Patient presents with  . fluid retention     S-Pt presented on  08/09/2012 with  Chief Complaint  Patient presents with  . fluid retention   .    Pt today feels better    Pt is asking when can she go home.  Meds . aspirin EC  81 mg Oral Daily  . heparin  5,000 Units Subcutaneous Q8H  . insulin aspart  0-15 Units Subcutaneous TID WC  . insulin aspart  0-5 Units Subcutaneous QHS  . insulin detemir  40 Units Subcutaneous QHS  . metoprolol succinate  25 mg Oral Daily  . sodium chloride  3 mL Intravenous Q12H  . torsemide  40 mg Oral Daily     Physical Exam: Vital signs in last 24 hours: Temp:  [97.6 F (36.4 C)-98.1 F (36.7 C)] 97.7 F (36.5 C) (04/26 0500) Pulse Rate:  [83-92] 86 (04/26 0500) Resp:  [18-20] 18 (04/26 0500) BP: (109-151)/(66-86) 109/70 mmHg (04/26 0500) SpO2:  [96 %-98 %] 96 % (04/26 0500) Weight change:  Last BM Date: 08/10/12  Intake/Output from previous day: 04/25 0701 - 04/26 0700 In: 240 [P.O.:240] Out: 1150 [Urine:1150] Total I/O In: 240 [P.O.:240] Out: 800 [Urine:800]   Physical Exam: General- pt is awake,alert, oriented to time place and person Resp- No acute REsp distress, CTA B/L NO Rhonchi CVS- S1S2 regular in rate and rhythm GIT- BS+, soft, NT, ND EXT- 1+ LE Edema, Cyanosis   Lab Results: CBC  Recent Labs  08/10/12 0233 08/11/12 0511  WBC 6.4 5.9  HGB 10.9* 11.6*  HCT 32.9* 35.0*  PLT 328 331    BMET  Recent Labs  08/11/12 0511 08/12/12 0635  NA 135 137  K 3.9 4.4  CL 102 104  CO2 28 28  GLUCOSE 158* 101*  BUN 28* 32*  CREATININE 1.98* 2.05*  CALCIUM 8.2* 8.1*   Creat trend 2014 1.63==>1.94==>2.05           1.6--1.8 ( Baseline)  2013  0.9--1.4  MICRO Recent Results (from the past 240 hour(s))  URINE CULTURE     Status: None   Collection Time    08/09/12  3:48 PM      Result Value Range Status   Specimen Description URINE, CLEAN CATCH   Final   Special Requests NONE   Final   Culture  Setup Time 08/10/2012 02:43   Final   Colony Count NO GROWTH   Final   Culture NO GROWTH   Final   Report Status 08/11/2012 FINAL   Final      Lab Results  Component Value Date   PTH 36.0 06/13/2011   CALCIUM 8.1* 08/12/2012   CAION 1.13 06/09/2011   PHOS 4.7* 08/11/2012   Abdo U/s done in 2013 Right Kidney: Right kidney measures 13.6 cm. No hydronephrosis.  Left Kidney: Left kidney measures 13.3 cm diameter. Septated  cystic lesion off of the lower pole measuring 5.2 x 3.5 x 4 cm.  This represents a minimally complex cyst, likely benign.  Abdominal aorta: Mid and distal aorta is obscured by bowel gas.  Visualized proximal aorta is not dilated.  IMPRESSION:  Minimally complex left renal cyst. Visualized abdominal organs are  otherwise unremarkable. Limited study due to bowel gas.    24 hr urine collected on 4/14 Shows 17 grams of Protein  Impression: 1)Renal  AKI secondary to Prerenal/ ATN/Hemodynamics from ARB AKI on CKD AKI worseing CKD stage 2/3? . CKD since 2013 CKD secondary to DM /? Membranous/ FSGS possible   Progression of CKD more than would expect fro Diabetic Nephropathy Proteinura . Present -Nephrotic       ACe / ARB held sec to AKI            Hematuria none . Nephrolithiasis Hx Absent Autoimmune Work up done in 2013- ANA, MPO, Anti GBM-negative   2)HTN Target Organ damage  CKD CAD CVA  Medication- On Diuretics- On Beta blockers   3)Anemia HGb at goal (9--11)   4)CKD Mineral-Bone Disorder PTH not avail Secondary Hyperparathyroidism absent. Phosphorus at goal.   5)Anasarca On Diuretics  6)Electrolytes Normokalemic NOrmonatremic   7)Acid base Co2 at goal     Plan:  Encouraged pt to discuss biopsy with her nephrologist. Encourage pt to follow up hier  Nephrologist as outpt. Will continue current diuretic regimen. Pt will need ARB as outpt     [name] S 08/12/2012, 9:21 AM

## 2012-08-14 ENCOUNTER — Encounter (HOSPITAL_COMMUNITY): Payer: Self-pay | Admitting: *Deleted

## 2012-08-14 ENCOUNTER — Ambulatory Visit (HOSPITAL_COMMUNITY)
Admission: RE | Admit: 2012-08-14 | Discharge: 2012-08-14 | Disposition: A | Discharge status: Home / Self Care | Payer: Medicare Other | Source: Ambulatory Visit | Attending: Gastroenterology | Admitting: Gastroenterology

## 2012-08-14 ENCOUNTER — Encounter (HOSPITAL_COMMUNITY)
Admission: RE | Disposition: A | Discharge status: Home / Self Care | Payer: Self-pay | Source: Ambulatory Visit | Attending: Gastroenterology

## 2012-08-14 DIAGNOSIS — E119 Type 2 diabetes mellitus without complications: Secondary | ICD-10-CM | POA: Insufficient documentation

## 2012-08-14 DIAGNOSIS — K2960 Other gastritis without bleeding: Secondary | ICD-10-CM | POA: Insufficient documentation

## 2012-08-14 DIAGNOSIS — R197 Diarrhea, unspecified: Secondary | ICD-10-CM

## 2012-08-14 DIAGNOSIS — K5730 Diverticulosis of large intestine without perforation or abscess without bleeding: Secondary | ICD-10-CM

## 2012-08-14 DIAGNOSIS — Z01812 Encounter for preprocedural laboratory examination: Secondary | ICD-10-CM | POA: Insufficient documentation

## 2012-08-14 DIAGNOSIS — R1013 Epigastric pain: Secondary | ICD-10-CM | POA: Insufficient documentation

## 2012-08-14 DIAGNOSIS — K219 Gastro-esophageal reflux disease without esophagitis: Secondary | ICD-10-CM

## 2012-08-14 DIAGNOSIS — D126 Benign neoplasm of colon, unspecified: Secondary | ICD-10-CM | POA: Insufficient documentation

## 2012-08-14 DIAGNOSIS — R1310 Dysphagia, unspecified: Secondary | ICD-10-CM | POA: Insufficient documentation

## 2012-08-14 DIAGNOSIS — K2980 Duodenitis without bleeding: Secondary | ICD-10-CM | POA: Insufficient documentation

## 2012-08-14 DIAGNOSIS — R12 Heartburn: Secondary | ICD-10-CM

## 2012-08-14 DIAGNOSIS — K648 Other hemorrhoids: Secondary | ICD-10-CM | POA: Insufficient documentation

## 2012-08-14 DIAGNOSIS — R112 Nausea with vomiting, unspecified: Secondary | ICD-10-CM

## 2012-08-14 HISTORY — PX: COLONOSCOPY WITH ESOPHAGOGASTRODUODENOSCOPY (EGD): SHX5779

## 2012-08-14 LAB — GLUCOSE, CAPILLARY: Glucose-Capillary: 123 mg/dL — ABNORMAL HIGH (ref 70–99)

## 2012-08-14 SURGERY — COLONOSCOPY WITH ESOPHAGOGASTRODUODENOSCOPY (EGD)
Anesthesia: Moderate Sedation

## 2012-08-14 MED ORDER — SODIUM CHLORIDE 0.9 % IV SOLN
INTRAVENOUS | Status: DC
  Administered 2012-08-14: 11:00:00 via INTRAVENOUS

## 2012-08-14 MED ORDER — MIDAZOLAM HCL 5 MG/5ML IJ SOLN
INTRAMUSCULAR | Status: DC | PRN
  Administered 2012-08-14: 2 mg via INTRAVENOUS
  Administered 2012-08-14: 1 mg via INTRAVENOUS
  Administered 2012-08-14: 2 mg via INTRAVENOUS

## 2012-08-14 MED ORDER — MIDAZOLAM HCL 5 MG/5ML IJ SOLN
INTRAMUSCULAR | Status: AC
  Filled 2012-08-14: qty 10

## 2012-08-14 MED ORDER — MEPERIDINE HCL 100 MG/ML IJ SOLN
INTRAMUSCULAR | Status: AC
  Filled 2012-08-14: qty 1

## 2012-08-14 MED ORDER — STERILE WATER FOR IRRIGATION IR SOLN
Status: DC | PRN
  Administered 2012-08-14: 11:00:00

## 2012-08-14 MED ORDER — RANITIDINE HCL 150 MG PO TABS: ORAL_TABLET | ORAL | Status: DC

## 2012-08-14 MED ORDER — OMEPRAZOLE 20 MG PO CPDR: DELAYED_RELEASE_CAPSULE | ORAL | Status: DC

## 2012-08-14 MED ORDER — MEPERIDINE HCL 100 MG/ML IJ SOLN
INTRAMUSCULAR | Status: DC | PRN
  Administered 2012-08-14 (×4): 25 mg via INTRAVENOUS

## 2012-08-14 MED ORDER — BUTAMBEN-TETRACAINE-BENZOCAINE 2-2-14 % EX AERO
INHALATION_SPRAY | CUTANEOUS | Status: DC | PRN
  Administered 2012-08-14: 2 via TOPICAL

## 2012-08-14 NOTE — H&P (Addendum)
Primary Care Physician:  Milinda Antis, MD Primary Gastroenterologist:  Dr. Darrick Penna  Pre-Procedure History & Physical: HPI:  Hannah [name] is a 42 y.o. female here for ABDOMINAL PAIN/Anemia//DIARRHEA/DYSPHAGIA.  Past Medical History  Diagnosis Date  . High cholesterol   . Diabetic retinopathy(362.0)   . Peripheral neuropathy     "tips of toes"  . Blind right eye   . Cardiomyopathy   . CHF (congestive heart failure)   . Cancer     Adenocarcinoma of lung  . CAD (coronary artery disease)     NO STENTS  . GERD (gastroesophageal reflux disease)   . Hypertension     under control with med., has been on med. x 1 yr.  . History of lung cancer 07/2011    s/p left lower lobectomy  . Diabetes mellitus     IDDM  . Runny nose 07/10/2012    clear drainage  . Cataract of right eye   . Ulcer of toe of right foot 07/10/2012    great toe  . Breast calcification, left 06/2012  . Nephrotic syndrome   . Nephrotic range proteinuria     Past Surgical History  Procedure Laterality Date  . Cardiac catheterization  07/16/2011  . Incision and drainage breast abscess Left   . Tubal ligation  1994  . Vitrectomy  2010    2 on left, 1 on right  . Cesarean section  1991; 1994  . Video assisted thoracoscopy (vats)/ lobectomy Left 07/30/2011    left main thoracotomy, left lower lobectomy, mediastinal lymph node dissection  . Lobectomy      Prior to Admission medications   Medication Sig Start Date End Date Taking? Authorizing Provider  insulin detemir (LEVEMIR) 100 UNIT/ML injection Inject 40 Units into the skin at bedtime.    Yes Historical Provider, MD  insulin lispro (HUMALOG) 100 UNIT/ML injection Inject 5 units subcutaneously with each meal. 07/31/12 07/31/13 Yes Salley Scarlet, MD  metoprolol succinate (TOPROL XL) 25 MG 24 hr tablet Take 1 tablet (25 mg total) by mouth daily. 07/21/12 07/21/13 Yes Salley Scarlet, MD  polyethylene glycol-electrolytes (TRILYTE) 420 G solution Take 4,000 mLs by  mouth as directed. 08/08/12  Yes West Bali, MD  ranitidine (ZANTAC) 150 MG tablet TAKE ONE TABLET BY MOUTH EVERY DAY AT BEDTIME 08/01/12  Yes Salley Scarlet, MD  torsemide (DEMADEX) 20 MG tablet Take 2 tablets (40 mg total) by mouth daily. 08/12/12  Yes Erick Blinks, MD    Allergies as of 08/08/2012 - Review Complete 08/08/2012  Allergen Reaction Noted  . Ciprofloxacin Rash 05/31/2011    Family History  Problem Relation Age of Onset  . Coronary artery disease Father   . Asthma Father   . COPD Father   . Hypertension Father   . Hyperlipidemia Father   . Diabetes Father   . Congestive Heart Failure Father   . Hypertension Mother   . Hyperlipidemia Mother   . Diabetes Mother   . Cancer Maternal Aunt      three aunts, bone, breast, ?  . Hypertension Brother   . Diabetes Brother   . Diabetes Sister   . Colon cancer Neg Hx   . Lung cancer Maternal Grandmother   . Celiac disease Neg Hx   . Crohn's disease Neg Hx   . Ulcerative colitis Neg Hx     History   Social History  . Marital Status: Married    Spouse Name: N/A    Number of Children:  3  . Years of Education: N/A   Occupational History  . Not on file.   Social History Main Topics  . Smoking status: Former Smoker -- 2.00 packs/day for 30 years    Quit date: 08/01/2011  . Smokeless tobacco: Never Used  . Alcohol Use: No  . Drug Use: No  . Sexually Active: Yes   Other Topics Concern  . Not on file   Social History Narrative  . No narrative on file    Review of Systems: See HPI, otherwise negative ROS   Physical Exam: BP 173/91  Pulse 99  Temp(Src) 98.2 F (36.8 C) (Oral)  Resp 14  Ht 5\' 7"  (1.702 m)  Wt 264 lb (119.75 kg)  BMI 41.34 kg/m2  SpO2 99%  LMP 08/01/2012 General:   Alert,  pleasant and cooperative in NAD Head:  Normocephalic and atraumatic. Neck:  Supple; Lungs:  Clear throughout to auscultation.    Heart:  Regular rate and rhythm. Abdomen:  Soft, nontender and nondistended.  Normal bowel sounds, without guarding, and without rebound.   Neurologic:  Alert and  oriented x4;  grossly normal neurologically.  Impression/Plan:     Anemia/diarrhea/ABDOMINAL PAIN/dyspahgia  PLAN: EGD/DIL/TCS TODAY

## 2012-08-14 NOTE — Op Note (Signed)
[facility] [address] [place], [address]   ENDOSCOPY PROCEDURE REPORT  PATIENT: Hannah [name], Hannah [name]  MR#: [id number] BIRTHDATE: [birth year] , 42  yrs. old GENDER: Female  ENDOSCOPIST: Jonette Eva, MD REFFERED NF:[id number] Wynot, M.D.  PROCEDURE DATE:  08/14/2012 PROCEDURE:   EGD with biopsy for H.  pylori/celiac sprue and with dilatation over guidewire INDICATIONS:1.  dysphagia.   2.  abdominal pain.   3.  diarrhea BMI 41, HGA1C APR 2012 12 MEDICATIONS: TCS + Demerol 50 mg IV TOPICAL ANESTHETIC: Cetacaine Spray DESCRIPTION OF PROCEDURE:   After the risks benefits and alternatives of the procedure were thoroughly explained, informed consent was obtained.  The EG-[id number] ([id number])  endoscope was introduced through the mouth and advanced to the second portion of the duodenum. The instrument was slowly withdrawn as the mucosa was carefully examined.  Prior to withdrawal of the scope, the guidwire was placed.  The esophagus was dilated successfully.  The patient was recovered in endoscopy and discharged home in satisfactory condition.   ESOPHAGUS: The mucosa of the esophagus appeared normal.   STOMACH: Moderate erosive gastritis (inflammation) was found in the gastric antrum.  Multiple biopsies were performed using cold forceps. DUODENUM: Mild duodenal inflammation was found in the duodenal bulb.   The duodenal mucosa showed no abnormalities in the 2nd part of the duodenum.  Cold forcep biopsies were taken in the second portion.   Dilation was then performed at the gastroesphageal junction  Dilator: Savary over guidewire Size(s): 15-16 MM Resistance: minimal TO MODERATE(16)  Heme: none  COMPLICATIONS: There were no complications.  ENDOSCOPIC IMPRESSION: 1.   The mucosa of the esophagus appeared normal. EMPIRIC DILATION PERFORMED. 2.   MODERATE Erosive gastritis 3.   MILD DUODENITIS 4.   EPIGASTRIC PAIN/NAUSEA/VOMTIING MOST LIKELY DUE TO GASTRITIS/GERD, LESS  LIKELY GASTROPARESIS  RECOMMENDATIONS: START OMEPRAZOLE.  TAKE 30 MINUTES PRIOR TO YOUR MEALS TWICE DAILY.  ZANTAC HELPs MOST WHEN USED AS NEEDED. AVOID ASPIRIN, IBUPROFEN, MOTRIN, ALEVE, BC/GOODY POWDERS FOR 2 WEEKS.  USE TYLENOL FOR PAIN. FOLLOW A HIGH FIBER/LOW FAT DIET.  AVOID ITEMS THAT CAUSE BLOATING.  CONTINUE WEIGHT LOSS PROGRAM. BIOPSY RESULTS SHOULD BE BACK IN 7 DAYS. FOLLOW UP IN 4 MOS.      _______________________________ Rosalie DoctorJonette Eva, MD 08/14/2012 12:43 PM      PATIENT NAME:  [name], [name] MR#: [id number]

## 2012-08-14 NOTE — Op Note (Addendum)
[facility] [address] [place], [address]   COLONOSCOPY PROCEDURE REPORT  PATIENT: Hannah, [name]  MR#: [id number] BIRTHDATE: July 12, 1970 , 42  yrs. old GENDER: Female ENDOSCOPIST: Jonette Eva, MD REFERRED XB:[id number] [place], M.D. PROCEDURE DATE:  08/14/2012 PROCEDURE:   Colonoscopy with snare polypectomy and Colonoscopy with biopsy INDICATIONS:unexplained diarrhea and epigastric abdominal pain. MEDICATIONS: Demerol 50 mg IV and Versed 5 mg IV DESCRIPTION OF PROCEDURE:    Physical exam was performed.  Informed consent was obtained from the patient after explaining the benefits, risks, and alternatives to procedure.  The patient was connected to monitor and placed in left lateral position. Continuous oxygen was provided by nasal cannula and IV medicine administered through an indwelling cannula.  After administration of sedation and rectal exam, the patients rectum was intubated and the EC-[id number] ([id number])  colonoscope was advanced under direct visualization to the ileum.  The scope was removed slowly by carefully examining the color, texture, anatomy, and integrity mucosa on the way out.  The patient was recovered in endoscopy and discharged home in satisfactory condition.    COLON FINDINGS: The mucosa appeared normal in the terminal ileum.  , A PEDUNCULATED polyp measuring 8-10 mm in size was found in the sigmoid colon.  A polypectomy was performed using snare cautery. RANDOM BIOPSIES OBTAINED TO EVALAUTE FOR MICROSCOPIC COLITIS. The resection was complete and the polyp tissue was completely retrieved, Mild diverticulosis was noted in the transverse colon. , Moderate sized internal hemorrhoids were found.  , and The colon was otherwise normal.  There was no inflammation, or cancers unless previously stated.  PREP QUALITY: excellent.  CECAL W/D TIME: 17 minutes COMPLICATIONS: None  ENDOSCOPIC IMPRESSION: 1.   Normal mucosa in the terminal ileum 2.    ONE large polyp in the sigmoid colon 3.   Mild diverticulosis was noted in the transverse colon 4.   Moderate sized internal hemorrhoids  RECOMMENDATIONS: AVOID ASPIRIN, IBUPROFEN, MOTRIN, ALEVE, BC/GOODY POWDERS FOR 2 WEEKS.  USE TYLENOL FOR PAIN. FOLLOW A HIGH FIBER/LOW FAT DIET.  AVOID ITEMS THAT CAUSE BLOATING.  CONTINUE WEIGHT LOSS PROGRAM.  YOU ARE APPROXIMATELY 100 LBS OVER WEIGHT. BIOPSY RESULTS SHOULD BE BACK IN 7 DAYS. FOLLOW UP IN 4 MOS. Next colonoscopy in 5 years. SISTERS, BROTHERS, CHILDREN, AND PARENTS NEED TO HAVE A COLONOSCOPY STARTING AT THE AGE OF 40.        _______________________________ Rosalie DoctorJonette Eva, MD 08/14/2012 12:46 PM Revised: 08/14/2012 12:46 PM    PATIENT NAME:  Hannah, [name] MR#: [id number]

## 2012-08-15 NOTE — Progress Notes (Signed)
Quick Note:  Called. Many rings and no answer. ______ 

## 2012-08-15 NOTE — Progress Notes (Signed)
Quick Note:  Letter mailed with the results of celiac screen. ______

## 2012-08-16 ENCOUNTER — Encounter (HOSPITAL_COMMUNITY): Payer: Self-pay | Admitting: Gastroenterology

## 2012-08-17 ENCOUNTER — Ambulatory Visit (INDEPENDENT_AMBULATORY_CARE_PROVIDER_SITE_OTHER): Payer: Medicare Other | Admitting: Family Medicine

## 2012-08-17 ENCOUNTER — Encounter: Payer: Self-pay | Admitting: Family Medicine

## 2012-08-17 VITALS — BP 124/80 | HR 103 | Resp 16 | Wt 258.4 lb

## 2012-08-17 DIAGNOSIS — N183 Chronic kidney disease, stage 3 unspecified: Secondary | ICD-10-CM

## 2012-08-17 DIAGNOSIS — E785 Hyperlipidemia, unspecified: Secondary | ICD-10-CM

## 2012-08-17 DIAGNOSIS — E119 Type 2 diabetes mellitus without complications: Secondary | ICD-10-CM

## 2012-08-17 DIAGNOSIS — N049 Nephrotic syndrome with unspecified morphologic changes: Secondary | ICD-10-CM

## 2012-08-17 NOTE — Assessment & Plan Note (Signed)
Reviewed labs with patient, worsening Cr. F/u renal next week, ? Biopsy needed

## 2012-08-17 NOTE — Assessment & Plan Note (Signed)
A1C worsened to 12% though pt states CBG improved, refer to endocrine Increase mealtime coverage 10 units  CBG need to be consistently below 200 so that breast biopsy can be done

## 2012-08-17 NOTE — Assessment & Plan Note (Signed)
Weight still up 7 pounds , continue demadex, no cardiopulmonary compromise  currently

## 2012-08-17 NOTE — Progress Notes (Signed)
  Subjective:    Patient ID: Hannah [name], female    DOB: 13-Apr-1971, 42 y.o.   MRN: [id number]  HPI   Pt here to f/u admission and diabetes mellitus. Found to have nephrotic syndrome with anasarca however not as severe as previous admission. Creatinine worsened during admission, did not tolerate restarting ACEI. Diuretic changed to demadex.  DM- CBG have been 90-150 fasting, 150-200's pre meal, no hypoglycemia. Medications reviewed, Hyperlipidemia- she had stopped crestor and lovaza after she became sick a month ago has not restarted either  CT chest was stable  Review of Systems - per above     GEN- denies fatigue, fever, weight loss,weakness, recent illness HEENT- denies eye drainage, change in vision, nasal discharge, CVS- denies chest pain, palpitations RESP- denies SOB, cough, wheeze ABD- denies N/V, change in stools, abd pain GU- denies dysuria, hematuria, dribbling, incontinence MSK- denies joint pain, muscle aches, injury Neuro- denies headache, dizziness, syncope, seizure activity      Objective:   Physical Exam GEN- NAD, alert and oriented x3 HEENT- PERRL, EOMI, non injected sclera, pink conjunctiva, MMM, oropharynx clear Neck- Supple, no JVD CVS- resting tachycardia, no murmur RESP-CTAB EXT- 1+ pitting edema Pulses- Radial 2+        Assessment & Plan:

## 2012-08-17 NOTE — Patient Instructions (Addendum)
Restart the crestor  Referral to Dr. Fransico Him- endocrinologist, A1C 12% Increase meal time to 10units, take blood sugars fasting and before meals F/U 2 months

## 2012-08-17 NOTE — Assessment & Plan Note (Signed)
Restart crestor, LFT wnl, she has carotid stenosis as well

## 2012-08-18 ENCOUNTER — Encounter: Payer: Self-pay | Admitting: Surgery

## 2012-08-18 ENCOUNTER — Ambulatory Visit: Payer: Medicare Other | Admitting: Family Medicine

## 2012-08-21 ENCOUNTER — Ambulatory Visit (INDEPENDENT_AMBULATORY_CARE_PROVIDER_SITE_OTHER): Payer: Medicare Other | Admitting: Surgery

## 2012-08-21 ENCOUNTER — Encounter: Payer: Self-pay | Admitting: Surgery

## 2012-08-21 ENCOUNTER — Telehealth: Payer: Self-pay | Admitting: Family Medicine

## 2012-08-21 ENCOUNTER — Other Ambulatory Visit (INDEPENDENT_AMBULATORY_CARE_PROVIDER_SITE_OTHER): Payer: Medicare Other | Admitting: *Deleted

## 2012-08-21 DIAGNOSIS — I6529 Occlusion and stenosis of unspecified carotid artery: Secondary | ICD-10-CM | POA: Insufficient documentation

## 2012-08-21 NOTE — Addendum Note (Signed)
Addended by: Sharee Pimple on: 08/21/2012 02:40 PM   Modules accepted: Orders

## 2012-08-21 NOTE — Progress Notes (Signed)
[facility] [place]   Patient name: Hannah [name] MRN: [id number] DOB: Mar 31, 1971 Sex: female     Chief Complaint  Patient presents with  . New Evaluation    left carotid stenosis     HISTORY OF PRESENT ILLNESS: The patient is here today for followup of her carotid occlusive disease. I initially met her as a consult in the hospital in February of 2013. At that time, she was undergoing treatment for stage I lung cancer. She ultimately underwent left lower lobectomy. She did not require chemotherapy. During this hospitalization she was found to have greater than 80% left carotid stenosis and 60-79% right carotid stenosis. She was asymptomatic. She is just now getting back to me for followup. She delayed having any for surgical procedures following her lobectomy. The patient is a long-time smoker. She did however quit for her operation. She was recently hospitalized. She suffers from diabetes. Her hemoglobin A1c was over 12. She is also medically managed for hypertension. Her blood pressures however have been under good control, according to her in the 120/80 range. She is medically managed for hypercholesterolemia with Crestor. She also suffers from chronic renal insufficiency. Her latest creatinine was 2.0 with a GFR of 28%. She continues to be without symptoms for carotid disease. Specifically, she denies numbness or weakness in either extremity. She denies slurred speech. She denies amaurosis fugax  Past Medical History  Diagnosis Date  . High cholesterol   . Diabetic retinopathy(362.0)   . Peripheral neuropathy     "tips of toes"  . Blind right eye   . Cardiomyopathy   . CHF (congestive heart failure)   . CAD (coronary artery disease)     NO STENTS  . GERD (gastroesophageal reflux disease)   . Hypertension     under control with med., has been on med. x 1 yr.  . History of lung cancer 07/2011    s/p left lower lobectomy  . Diabetes mellitus     IDDM  . Runny  nose 07/10/2012    clear drainage  . Cataract of right eye   . Ulcer of toe of right foot 07/10/2012    great toe  . Breast calcification, left 06/2012  . Nephrotic syndrome   . Nephrotic range proteinuria   . Cancer     Adenocarcinoma of lung  . Acute biphenotypic leukemia     Past Surgical History  Procedure Laterality Date  . Cardiac catheterization  07/16/2011  . Incision and drainage breast abscess Left   . Tubal ligation  1994  . Vitrectomy  2010    2 on left, 1 on right  . Cesarean section  1991; 1994  . Video assisted thoracoscopy (vats)/ lobectomy Left 07/30/2011    left main thoracotomy, left lower lobectomy, mediastinal lymph node dissection  . Lobectomy    . Colonoscopy with esophagogastroduodenoscopy (egd) N/A 08/14/2012    Procedure: COLONOSCOPY WITH ESOPHAGOGASTRODUODENOSCOPY (EGD);  Surgeon: West Bali, MD;  Location: [facility];  Service: Endoscopy;  Laterality: N/A;  10:45-moved to [address] to notify pt    History   Social History  . Marital Status: Married    Spouse Name: N/A    Number of Children: 3  . Years of Education: N/A   Occupational History  . Not on file.   Social History Main Topics  . Smoking status: Former Smoker -- 2.00 packs/day for 30 years    Quit date: 08/01/2011  . Smokeless tobacco: Never  Used  . Alcohol Use: No  . Drug Use: No  . Sexually Active: Yes   Other Topics Concern  . Not on file   Social History Narrative  . No narrative on file    Family History  Problem Relation Age of Onset  . Coronary artery disease Father   . Asthma Father   . COPD Father   . Hypertension Father   . Hyperlipidemia Father   . Diabetes Father   . Congestive Heart Failure Father   . Heart disease Father   . Heart attack Father   . Peripheral vascular disease Father   . Hypertension Mother   . Hyperlipidemia Mother   . Diabetes Mother   . Cancer Mother   . Cancer Maternal Aunt      three aunts, bone, breast, ?  .  Hypertension Brother   . Diabetes Brother   . Diabetes Sister   . Colon cancer Neg Hx   . Celiac disease Neg Hx   . Crohn's disease Neg Hx   . Ulcerative colitis Neg Hx   . Lung cancer Maternal Grandmother     Allergies as of 08/21/2012 - Review Complete 08/21/2012  Allergen Reaction Noted  . Ciprofloxacin Rash 05/31/2011    Current Outpatient Prescriptions on File Prior to Visit  Medication Sig Dispense Refill  . insulin detemir (LEVEMIR) 100 UNIT/ML injection Inject 40 Units into the skin at bedtime.       . insulin lispro (HUMALOG) 100 UNIT/ML injection Inject 10 units subcutaneously with each meal.      . metoprolol succinate (TOPROL XL) 25 MG 24 hr tablet Take 1 tablet (25 mg total) by mouth daily.  30 tablet  3  . omeprazole (PRILOSEC) 20 MG capsule 1 po bid 30 minutes before meals for 3 mos then once daily FOREVER  62 capsule  11  . rosuvastatin (CRESTOR) 40 MG tablet Take 40 mg by mouth daily.      Marland Kitchen torsemide (DEMADEX) 20 MG tablet Take 2 tablets (40 mg total) by mouth daily.  60 tablet  1  . ranitidine (ZANTAC) 150 MG tablet 1 po qhs prn for upper abdominal pain or heartburn  30 tablet  3   No current facility-administered medications on file prior to visit.     REVIEW OF SYSTEMS: Cardiovascular: Positive for shortness of breath when lying flat source breath with exertion, pain in her legs and walking and swelling in the legs Pulmonary: Positive for productive cough. Neurologic: Positive for weakness in left arm with activity.  Hematologic: No bleeding problems or clotting disorders. Musculoskeletal: No joint pain or joint swelling. Gastrointestinal: No blood in stool or hematemesis Genitourinary: No dysuria or hematuria. Psychiatric:: No history of major depression. Integumentary: Positive for rash Constitutional: No fever or chills.  PHYSICAL EXAMINATION:   Vital signs are BP 136/88  Pulse 98  Ht 5\' 7"  (1.702 m)  Wt 266 lb 1.6 oz (120.702 kg)  BMI 41.67 kg/m2   SpO2 100%  LMP 08/06/2012 General: The patient appears their stated age. HEENT:  No gross abnormalities Pulmonary:  Non labored breathing Abdomen: Soft and non-tender Musculoskeletal: There are no major deformities. Neurologic: No focal weakness or paresthesias are detected, Skin: There are no ulcer or rashes noted. Psychiatric: The patient has normal affect. Cardiovascular: There is a regular rate and rhythm without significant murmur appreciated. No carotid bruits   Diagnostic Studies Ultrasound today shows bilateral 60-79% stenosis  Assessment: Bilateral astigmatic carotid stenosis Plan: On ultrasound today, the  left-sided stenosis does not appear as severe as it is over a year ago. I discussed this with the patient. Normally, however recommend a confirmatory study such as an angiogram or a CT angiogram, however with her chronic renal insufficiency I would rather not do this. She has remained stable with her disease, and remained asymptomatic. For that reason we have decided to continue with close observation. She will followup in 6 months with a repeat carotid duplex. She has recently stopped her baby aspirin secondary to gastritis on endoscopy. She is set to restart this in a few weeks. She will I. was to continue management for her diabetes hypercholesterolemia and hypertension.  Jorge Ny, M.D. [facility] [place] Office: [phone] Pager:  [phone]

## 2012-08-22 ENCOUNTER — Telehealth: Payer: Self-pay | Admitting: Gastroenterology

## 2012-08-22 DIAGNOSIS — K2940 Chronic atrophic gastritis without bleeding: Secondary | ICD-10-CM

## 2012-08-22 NOTE — Telephone Encounter (Signed)
Please call pharmacy, it may be a formulary problem We would need a PA as she had a reaction with lantus

## 2012-08-22 NOTE — Telephone Encounter (Signed)
Called and informed pt and lab order faxed to [facility].  

## 2012-08-22 NOTE — Telephone Encounter (Signed)
Results forwarded to PCP

## 2012-08-22 NOTE — Telephone Encounter (Signed)
Reminder in epic °

## 2012-08-22 NOTE — Telephone Encounter (Signed)
Please call pt. She had ONE advanced adenomas removed. Please call pt. HER stomach Bx shows ATROPHIC gastritis, WHICH MEANS THE LINING OF HER STOMACH THIN AND SHE MAY PRODUCE LESS ACID.    SHE NEEDS A BLOOD TEST TO CHECK FOR TO SEE IF SHE HAS AUTOIMMUNE GASTRITIS. CONTINUE OMEPRAZOLE.  TAKE 30 MINUTES PRIOR TO YOUR FIRST MEAL. ZANTAC HELPs MOST WHEN USED AS NEEDED. AVOID ASPIRIN, IBUPROFEN, MOTRIN, ALEVE, BC/GOODY POWDERS FOR 2 WEEKS. USE TYLENOL FOR PAIN. FOLLOW A HIGH FIBER/LOW FAT DIET. AVOID ITEMS THAT CAUSE BLOATING. CONTINUE WEIGHT LOSS PROGRAM.  FOLLOW UP IN 4 MOS. Next colonoscopy in 5 years. YOUR SISTERS, BROTHERS, CHILDREN, AND PARENTS NEED TO HAVE A COLONOSCOPY STARTING AT THE AGE OF 40 AND EVERY 5 YEARS.

## 2012-08-28 NOTE — Telephone Encounter (Signed)
PLEASE CALL PT.  She needs to complete her labs.

## 2012-08-28 NOTE — Telephone Encounter (Signed)
Mailed a lab reminder.

## 2012-09-01 NOTE — Telephone Encounter (Signed)
Pharmacy confirmed that insurance picked rx and copay of $6

## 2012-09-13 ENCOUNTER — Other Ambulatory Visit: Payer: Self-pay | Admitting: Cardiology

## 2012-09-14 ENCOUNTER — Other Ambulatory Visit: Payer: Self-pay

## 2012-09-14 MED ORDER — INSULIN LISPRO 100 UNIT/ML ~~LOC~~ SOLN: SUBCUTANEOUS | Status: DC

## 2012-10-09 ENCOUNTER — Telehealth: Payer: Self-pay | Admitting: Family Medicine

## 2012-10-09 NOTE — Telephone Encounter (Signed)
I called pt to see what her CBG have been running- Left voicemail Also to see if she heard from endocrinology She needs to have CBG below 200 in order to have breast biopsy done     Please call pt back and get above information

## 2012-10-10 NOTE — Telephone Encounter (Signed)
Left message to return call 

## 2012-10-12 NOTE — Telephone Encounter (Signed)
Increase to 45 units of levemir, continue 10 units humalog with meals  Call back in 1 week with CBG  Refill her insulin at pharmacy as needed

## 2012-10-12 NOTE — Telephone Encounter (Signed)
Pt is still running in the 200s for her CBG, she had to cancel her appt with Nida and in process of rescheduling. Pt wants to know since still runing in 200s do you want her to increase her insulin. If so please call and let pharmacy know(note to self).

## 2012-10-13 MED ORDER — INSULIN DETEMIR 100 UNIT/ML ~~LOC~~ SOLN: 40.0000 [IU] | Freq: Every day | SUBCUTANEOUS | Status: DC

## 2012-10-13 MED ORDER — INSULIN LISPRO 100 UNIT/ML ~~LOC~~ SOLN: SUBCUTANEOUS | Status: DC

## 2012-10-13 NOTE — Telephone Encounter (Signed)
LMTRC

## 2012-10-13 NOTE — Telephone Encounter (Signed)
Pt is aware. She was given Humalog vials last time she went to the pharmacy, so she needs to get some pens called in for her because she cannot measure good enough out of the vials.

## 2012-10-13 NOTE — Addendum Note (Signed)
Addended by: Elvina Mattes T on: 10/13/2012 01:18 PM   Modules accepted: Orders

## 2012-10-13 NOTE — Addendum Note (Signed)
Addended by: Elvina Mattes T on: 10/13/2012 04:51 PM   Modules accepted: Orders

## 2012-10-13 NOTE — Telephone Encounter (Signed)
done

## 2012-10-23 ENCOUNTER — Telehealth: Payer: Self-pay | Admitting: Family Medicine

## 2012-10-23 MED ORDER — TORSEMIDE 20 MG PO TABS: 40.0000 mg | ORAL_TABLET | Freq: Every day | ORAL | Status: DC

## 2012-10-23 NOTE — Telephone Encounter (Signed)
Med refilled.

## 2012-10-24 ENCOUNTER — Other Ambulatory Visit: Payer: Self-pay | Admitting: Family Medicine

## 2012-10-24 MED ORDER — TORSEMIDE 20 MG PO TABS: 40.0000 mg | ORAL_TABLET | Freq: Every day | ORAL | Status: DC

## 2012-10-24 NOTE — Telephone Encounter (Signed)
Approved per Tanya Nones

## 2012-10-26 ENCOUNTER — Other Ambulatory Visit: Payer: Self-pay

## 2012-10-27 ENCOUNTER — Encounter (INDEPENDENT_AMBULATORY_CARE_PROVIDER_SITE_OTHER): Payer: Self-pay | Admitting: Surgery

## 2012-10-27 ENCOUNTER — Ambulatory Visit (INDEPENDENT_AMBULATORY_CARE_PROVIDER_SITE_OTHER): Payer: Medicare Other | Admitting: Surgery

## 2012-10-27 VITALS — BP 136/78 | HR 82 | Resp 16 | Ht 67.0 in | Wt 253.4 lb

## 2012-10-27 DIAGNOSIS — N63 Unspecified lump in unspecified breast: Secondary | ICD-10-CM

## 2012-10-27 DIAGNOSIS — N6320 Unspecified lump in the left breast, unspecified quadrant: Secondary | ICD-10-CM

## 2012-10-27 NOTE — Progress Notes (Signed)
Pt was to be done here 3/14-anesthesia stated pt has too many medical problems to be done here cds-ccs scheduler notified

## 2012-10-27 NOTE — Patient Instructions (Signed)
Lumpectomy, Breast Conserving Surgery A lumpectomy is breast surgery that removes only part of the breast. Another name used may be partial mastectomy. The amount removed varies. Make sure you understand how much of your breast will be removed. Reasons for a lumpectomy:  Any solid breast mass.  Grouped significant nodularity that may be confused with a solitary breast mass. Lumpectomy is the most common form of breast cancer surgery today. The surgeon removes the portion of your breast which contains the tumor (cancer). This is the lump. Some normal tissue around the lump is also removed to be sure that all the tumor has been removed.  If cancer cells are found in the margins where the breast tissue was removed, your surgeon will do more surgery to remove the remaining cancer tissue. This is called re-excision surgery. Radiation and/or chemotherapy treatments are often given following a lumpectomy to kill any cancer cells that could possibly remain.  REASONS YOU MAY NOT BE ABLE TO HAVE BREAST CONSERVING SURGERY:  The tumor is located in more than one place.  Your breast is small and the tumor is large so the breast would be disfigured.  The entire tumor removal is not successful with a lumpectomy.  You cannot commit to a full course of chemotherapy, radiation therapy or are pregnant and cannot have radiation.  You have previously had radiation to the breast to treat cancer. HOW A LUMPECTOMY IS PERFORMED If overnight nursing is not required following a biopsy, a lumpectomy can be performed as a same-day surgery. This can be done in a hospital, clinic, or surgical center. The anesthesia used will depend on your surgeon. They will discuss this with you. A general anesthetic keeps you sleeping through the procedure. LET YOUR CAREGIVERS KNOW ABOUT THE FOLLOWING:  Allergies  Medications taken including herbs, eye drops, over the counter medications, and creams.  Use of steroids (by mouth or  creams)  Previous problems with anesthetics or Novocaine.  Possibility of pregnancy, if this applies  History of blood clots (thrombophlebitis)  History of bleeding or blood problems.  Previous surgery  Other health problems BEFORE THE PROCEDURE You should be present one hour prior to your procedure unless directed otherwise.  AFTER THE PROCEDURE  After surgery, you will be taken to the recovery area where a nurse will watch and check your progress. Once you're awake, stable, and taking fluids well, barring other problems you will be allowed to go home.  Ice packs applied to your operative site may help with discomfort and keep the swelling down.  A small rubber drain may be placed in the breast for a couple of days to prevent a hematoma from developing in the breast.  A pressure dressing may be applied for 24 to 48 hours to prevent bleeding.  Keep the wound dry.  You may resume a normal diet and activities as directed. Avoid strenuous activities affecting the arm on the side of the biopsy site such as tennis, swimming, heavy lifting (more than 10 pounds) or pulling.  Bruising in the breast is normal following this procedure.  Wearing a bra - even to bed - may be more comfortable and also help keep the dressing on.  Change dressings as directed.  Only take over-the-counter or prescription medicines for pain, discomfort, or fever as directed by your caregiver. Call for your results as instructed by your surgeon. Remember it is your responsibility to get the results of your lumpectomy if your surgeon asked you to follow-up. Do not assume   everything is fine if you have not heard from your caregiver. SEEK MEDICAL CARE IF:   There is increased bleeding (more than a small spot) from the wound.  You notice redness, swelling, or increasing pain in the wound.  Pus is coming from wound.  An unexplained oral temperature above 102 F (38.9 C) develops.  You notice a foul smell  coming from the wound or dressing. SEEK IMMEDIATE MEDICAL CARE IF:   You develop a rash.  You have difficulty breathing.  You have any allergic problems. Document Released: 05/17/2006 Document Revised: 06/28/2011 Document Reviewed: 08/18/2006 [employer] Patient Information 2014 [employer].  

## 2012-10-27 NOTE — Progress Notes (Signed)
Patient ID: Hannah [name], female   DOB: [birth year], 42 y.o.   MRN: [id number]  Chief Complaint  Patient presents with  . Routine Post Op    re eval for breast bx    HPI Hannah [name] is a 42 y.o. female.  The patient sent at the request of Dr.Lin 4 suspicious left breast microcalcifications detected on diagnostic mammography. These are not amenable to stereotactic core biopsy. Denies any history of breast pain, breast mass, or nipple discharge. 1 first-degree and breast cancer. She has a history of lung cancer status post surgical resection. Pt scheduled for earlier this year but glucose levels were too high.  She has better control of this now.   Here to reschedule.  HPI  Past Medical History  Diagnosis Date  . High cholesterol   . Diabetic retinopathy   . Peripheral neuropathy     "tips of toes"  . Blind right eye   . Cardiomyopathy   . CHF (congestive heart failure)   . CAD (coronary artery disease)     NO STENTS  . GERD (gastroesophageal reflux disease)   . Hypertension     under control with med., has been on med. x 1 yr.  . History of lung cancer 07/2011    s/p left lower lobectomy  . Diabetes mellitus     IDDM  . Runny nose 07/10/2012    clear drainage  . Cataract of right eye   . Ulcer of toe of right foot 07/10/2012    great toe  . Breast calcification, left 06/2012  . Nephrotic syndrome   . Nephrotic range proteinuria   . Cancer     Adenocarcinoma of lung  . Acute biphenotypic leukemia     Past Surgical History  Procedure Laterality Date  . Cardiac catheterization  07/16/2011  . Incision and drainage breast abscess Left   . Tubal ligation  1994  . Vitrectomy  2010    2 on left, 1 on right  . Cesarean section  1991; 1994  . Video assisted thoracoscopy (vats)/ lobectomy Left 07/30/2011    left main thoracotomy, left lower lobectomy, mediastinal lymph node dissection  . Lobectomy    . Colonoscopy with esophagogastroduodenoscopy (egd) N/A 08/14/2012   Procedure: COLONOSCOPY WITH ESOPHAGOGASTRODUODENOSCOPY (EGD);  Surgeon: West Bali, MD;  Location: [facility];  Service: Endoscopy;  Laterality: N/A;  10:45-moved to [address] to notify pt    Family History  Problem Relation Age of Onset  . Coronary artery disease Father   . Asthma Father   . COPD Father   . Hypertension Father   . Hyperlipidemia Father   . Diabetes Father   . Congestive Heart Failure Father   . Heart disease Father   . Heart attack Father   . Peripheral vascular disease Father   . Hypertension Mother   . Hyperlipidemia Mother   . Diabetes Mother   . Cancer Mother   . Cancer Maternal Aunt      three aunts, bone, breast, ?  . Hypertension Brother   . Diabetes Brother   . Diabetes Sister   . Colon cancer Neg Hx   . Celiac disease Neg Hx   . Crohn's disease Neg Hx   . Ulcerative colitis Neg Hx   . Lung cancer Maternal Grandmother     Social History History  Substance Use Topics  . Smoking status: Former Smoker -- 2.00 packs/day for 30 years    Quit date: 08/01/2011  .  Smokeless tobacco: Never Used  . Alcohol Use: No    Allergies  Allergen Reactions  . Ciprofloxacin Rash    Current Outpatient Prescriptions  Medication Sig Dispense Refill  . insulin detemir (LEVEMIR) 100 UNIT/ML injection Inject 0.4 mLs (40 Units total) into the skin at bedtime. Inject 45 units into skin at bedtime  10 mL  11  . insulin lispro (HUMALOG) 100 UNIT/ML injection Inject 10 units subcutaneously with each meal.  10 mL  1  . metolazone (ZAROXOLYN) 2.5 MG tablet       . metoprolol succinate (TOPROL XL) 25 MG 24 hr tablet Take 1 tablet (25 mg total) by mouth daily.  30 tablet  3  . torsemide (DEMADEX) 20 MG tablet Take 2 tablets (40 mg total) by mouth daily.  60 tablet  1   No current facility-administered medications for this visit.    Review of Systems Review of Systems  Constitutional: Positive for fatigue.  HENT: Negative.   Eyes: Negative.   Respiratory:  Positive for shortness of breath.   Cardiovascular: Negative.   Gastrointestinal: Negative.   Endocrine: Negative.   Genitourinary: Negative.   Neurological: Negative.   Hematological: Negative.   Psychiatric/Behavioral: Negative.     Blood pressure 136/78, pulse 82, resp. rate 16, height 5\' 7"  (1.702 m), weight 253 lb 6.4 oz (114.941 kg).  Physical Exam Physical Exam  Constitutional: She is oriented to person, place, and time. She appears well-developed and well-nourished.  HENT:  Head: Normocephalic and atraumatic.  Eyes: EOM are normal. Pupils are equal, round, and reactive to light.  Neck: Normal range of motion. Neck supple.  Cardiovascular: Normal rate and regular rhythm.   Pulmonary/Chest: Effort normal. Right breast exhibits no inverted nipple, no mass, no nipple discharge, no skin change and no tenderness. Left breast exhibits no inverted nipple, no mass, no nipple discharge, no skin change and no tenderness.  Neurological: She is alert and oriented to person, place, and time.  Skin: Skin is warm and dry.  Psychiatric: She has a normal mood and affect. Her behavior is normal. Judgment and thought content normal.    Data Reviewed s *RADIOLOGY REPORT*  Clinical Data: Abnormal left screening mammogram  DIGITAL DIAGNOSTIC LEFT MAMMOGRAM WITH CAD  Comparison: 12/05/2008 from [facility]  Findings:  ACR Breast Density Category 3: The breast tissue is heterogeneously  dense.  Additional imaging of the the left breast shows a developing 7 mm  cluster of calcifications that vary in size and shape. Tissue  sampling is recommended.  Mammographic images were processed with CAD.  IMPRESSION:  Suspicious left breast calcifications. Tissue sampling is  recommended.  RECOMMENDATION:  Stereotactic biopsy will be scheduled at the patient's convenience.  I have discussed the findings and recommendations with the patient.  Results were also provided in writing at the  conclusion of the  visit.  BI-RADS CATEGORY 4: Suspicious abnormality - biopsy should be  considered.    Assessment    Left breast suspicious microcalcifications 7 mm not amenable to stereotactic biopsy    Plan    Discussed observation versus surgical excision. Patient would like to proceed with left breast needle localized lumpectomy. The procedure has been discussed with the patient. Alternatives to surgery have been discussed with the patient.  Risks of surgery include bleeding,  Infection,  Seroma formation, death,  and the need for further surgery.   The patient understands and wishes to proceed.       Damontre Millea A. 10/27/2012, 10:48 AM

## 2012-11-01 ENCOUNTER — Encounter (HOSPITAL_COMMUNITY)
Admission: RE | Disposition: A | Discharge status: Home / Self Care | Payer: Self-pay | Source: Ambulatory Visit | Attending: Surgery

## 2012-11-01 SURGERY — BREAST LUMPECTOMY WITH NEEDLE LOCALIZATION
Anesthesia: General | Site: Breast | Laterality: Left

## 2012-11-02 ENCOUNTER — Encounter (HOSPITAL_COMMUNITY): Payer: Self-pay | Admitting: Pharmacy Technician

## 2012-11-07 ENCOUNTER — Encounter (HOSPITAL_COMMUNITY)
Admission: RE | Admit: 2012-11-07 | Discharge: 2012-11-07 | Disposition: A | Discharge status: Home / Self Care | Payer: Medicare Other | Source: Ambulatory Visit | Attending: Surgery | Admitting: Surgery

## 2012-11-07 ENCOUNTER — Encounter (HOSPITAL_COMMUNITY): Payer: Self-pay

## 2012-11-07 VITALS — BP 150/72 | HR 112 | Temp 98.3°F | Resp 18 | Ht 67.0 in | Wt 255.8 lb

## 2012-11-07 DIAGNOSIS — Z01818 Encounter for other preprocedural examination: Secondary | ICD-10-CM | POA: Insufficient documentation

## 2012-11-07 DIAGNOSIS — Z01812 Encounter for preprocedural laboratory examination: Secondary | ICD-10-CM | POA: Insufficient documentation

## 2012-11-07 DIAGNOSIS — N6320 Unspecified lump in the left breast, unspecified quadrant: Secondary | ICD-10-CM

## 2012-11-07 LAB — BASIC METABOLIC PANEL
Chloride: 99 mEq/L (ref 96–112)
Creatinine, Ser: 2.06 mg/dL — ABNORMAL HIGH (ref 0.50–1.10)
GFR calc Af Amer: 33 mL/min — ABNORMAL LOW (ref >90)
Potassium: 3.4 mEq/L — ABNORMAL LOW (ref 3.5–5.1)

## 2012-11-07 LAB — CBC
MCV: 89.6 fL (ref 78.0–100.0)
Platelets: 332 10*3/uL (ref 150–400)
RDW: 14 % (ref 11.5–15.5)
WBC: 7.4 10*3/uL (ref 4.0–10.5)

## 2012-11-07 NOTE — Progress Notes (Signed)
Anesthesia Chart Review: Patient is a 42 year old female scheduled for left breast lumpectomy with needle localization for left breast calcifications on 11/14/12 by Dr. Luisa Hart. These were not amenable to stereotactic core biopsy. Anesthesiologist Dr. Ivin Booty felt this procedure should be done at [facility] (instead of [facility]) due to her multiple medical issues.  Also this procedure was initially scheduled for 07/13/12, but her surgeon postponed it until she could get her DM under better control.  I had previously discussed this case with anesthesiologist Dr. 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 Abel Presto), HTN, GERD, CAD, cardiomyopathy, CHF, carotid occlusive disease (Dr. Myra Gianotti). History also lists acute biphenotypic leukemia, but I don't see this listed anywhere else including recent cardiology, PCP, TCTS, CCS, or nephrology notes . PCP is Dr. Milinda Antis.   Cardiologist is Dr. Jens Som, last visit 01/21/12; However, she was seen by his partner Dr. Dietrich Pates during a hospital admission at [facility] in late April 2014 for nephrotic syndrome/anasarca which responded to diuresis. She had an echo, but no further cardiac testing was recommended at that time.  EKG on 08/10/12 showed NSR, anterior infarct (age undetermined), ST/T wave abnormality, consider inferolateral ischemia.  Echo on 08/10/12 showed: - Left ventricle: The cavity size was normal. There was mild concentric hypertrophy. Systolic function was normal. The estimated ejection fraction was in the range of 55% to 60%. Wall motion was normal; there were no regional wall motion abnormalities. - Aortic valve: Mildly calcified annulus. Trileaflet. -  Mitral valve: Calcified annulus. - Right ventricle: The cavity size was normal. Wall thickness was mildly increased. - Atrial septum: No defect or patent foramen ovale was identified.  Myoview was performed on 07/14/11 that showed an ejection fraction of 49%. There was anterior and inferior ischemia. Cardiac catheterization was performed on 07/16/11. There was a 30% proximal LAD followed by a 50% mid and 40% distal. There was an 80% first diagonal. There were multiple 50-60% mid circumflex lesions. There was an 80% OM 1. The right coronary was occluded but filled via collaterals from the left. Ejection fraction 55%. The diagonal and OM were felt to be too small to stent and medical therapy was recommended.   Carotid duplex on 08/21/12 showed bilateral 60-79% ICA stenosis  (previously > 80% LICAS on 07/29/11).  Dr. Myra Gianotti did not want to pursue CTA due to her CKD, so repeat carotid duplex in six months was recommended.    CXR on 02/09/12 showed no acute cardiopulmonary disease. Post-operative changes of left lower lobectomy.  Preoperative labs noted.  Cr 3.4, BUN 47, Cr 2.06 which is stable since 08/12/12), glucose 189, H/H 11.8/34.5.  I requested and received the most recent nephrology notes from Dr. Arrie Aran.  He last saw patient on 08/23/12.  He wanted to eventually pursue renal biopsy for further evaluation of her nephrotic syndrome; However, he wanted to wait until breast cancer had been ruled out "as steroids would make her diabetes worse and immunosuppressant therapy could make her malignancy worse."  She has had DM medication (insulin) adjustment since March 2014 with better control.  She was hospitalized in April 2014 with further cardiology and nephrology input.  No further cardiology was recommended at that time.  Nephrology would like to consider  renal biopsy, but not until breast cancer is ruled out.  Her BUN is up, but her Cr remains stable since 07/2012. She has also had further follow-up with  CT and vascular surgery.  She has seen multiple specialists over the last three months with attempts to optimize management of her multiple medical issues.  By notes, it appears both her glucose and volume status have improved.  I was not asked to evaluate her during her PAT visit, but she will be evaluated by her assigned anesthesiologist on the day of surgery.  If glucose result is acceptable and otherwise no acute changes then I would anticipate that she could proceed as planned.  [name] [facility] Short Stay Center/Anesthesiology Phone [phone] 11/07/2012 3:35 PM

## 2012-11-07 NOTE — Pre-Procedure Instructions (Signed)
Hannah [name]  11/07/2012   Your procedure is scheduled on:  Tuesday, July 29th  Report to [facility] at 0930 AM.  Call this number if you have problems the morning of surgery: [phone]   Remember:   Do not eat food or drink liquids after midnight.   Take these medicines the morning of surgery with A SIP OF WATER: toprol, tylenol if needed   Do not wear jewelry, make-up or nail polish.  Do not wear lotions, powders, or perfumes,deodorant.  Do not shave 48 hours prior to surgery. Men may shave face and neck.  Do not bring valuables to the hospital.  [facility] is not responsible  for any belongings or valuables.  Contacts, dentures or bridgework may not be worn into surgery.  Leave suitcase in the car. After surgery it may be brought to your room.  For patients admitted to the hospital, checkout time is 11:00 AM the day of discharge.   Patients discharged the day of surgery will not be allowed to drive home.    Special Instructions: Shower using CHG 2 nights before surgery and the night before surgery.  If you shower the day of surgery use CHG.  Use special wash - you have one bottle of CHG for all showers.  You should use approximately 1/3 of the bottle for each shower.   Please read over the following fact sheets that you were given: Pain Booklet, Coughing and Deep Breathing and Surgical Site Infection Prevention

## 2012-11-13 MED ORDER — DEXTROSE 5 % IV SOLN
3.0000 g | INTRAVENOUS | Status: DC
  Filled 2012-11-13: qty 3000

## 2012-11-14 ENCOUNTER — Ambulatory Visit (HOSPITAL_COMMUNITY): Payer: Medicare Other | Admitting: Anesthesiology

## 2012-11-14 ENCOUNTER — Encounter (HOSPITAL_BASED_OUTPATIENT_CLINIC_OR_DEPARTMENT_OTHER): Payer: Self-pay | Admitting: *Deleted

## 2012-11-14 ENCOUNTER — Ambulatory Visit (HOSPITAL_BASED_OUTPATIENT_CLINIC_OR_DEPARTMENT_OTHER)
Admission: RE | Admit: 2012-11-14 | Discharge: 2012-11-14 | Disposition: A | Discharge status: Home / Self Care | Payer: Medicare Other | Source: Ambulatory Visit | Attending: Surgery | Admitting: Surgery

## 2012-11-14 ENCOUNTER — Ambulatory Visit
Admission: RE | Admit: 2012-11-14 | Discharge: 2012-11-14 | Disposition: A | Discharge status: Home / Self Care | Payer: Medicare Other | Source: Ambulatory Visit | Attending: Surgery | Admitting: Surgery

## 2012-11-14 ENCOUNTER — Encounter (HOSPITAL_COMMUNITY): Payer: Self-pay | Admitting: Vascular Surgery

## 2012-11-14 ENCOUNTER — Encounter (HOSPITAL_COMMUNITY)
Admission: RE | Disposition: A | Discharge status: Home / Self Care | Payer: Self-pay | Source: Ambulatory Visit | Attending: Surgery

## 2012-11-14 DIAGNOSIS — D249 Benign neoplasm of unspecified breast: Secondary | ICD-10-CM

## 2012-11-14 DIAGNOSIS — C9480 Other specified leukemias not having achieved remission: Secondary | ICD-10-CM | POA: Insufficient documentation

## 2012-11-14 DIAGNOSIS — Z85118 Personal history of other malignant neoplasm of bronchus and lung: Secondary | ICD-10-CM | POA: Insufficient documentation

## 2012-11-14 DIAGNOSIS — I428 Other cardiomyopathies: Secondary | ICD-10-CM | POA: Insufficient documentation

## 2012-11-14 DIAGNOSIS — E1139 Type 2 diabetes mellitus with other diabetic ophthalmic complication: Secondary | ICD-10-CM | POA: Insufficient documentation

## 2012-11-14 DIAGNOSIS — E11319 Type 2 diabetes mellitus with unspecified diabetic retinopathy without macular edema: Secondary | ICD-10-CM | POA: Insufficient documentation

## 2012-11-14 DIAGNOSIS — Z87891 Personal history of nicotine dependence: Secondary | ICD-10-CM | POA: Insufficient documentation

## 2012-11-14 DIAGNOSIS — Z794 Long term (current) use of insulin: Secondary | ICD-10-CM | POA: Insufficient documentation

## 2012-11-14 DIAGNOSIS — Z902 Acquired absence of lung [part of]: Secondary | ICD-10-CM | POA: Insufficient documentation

## 2012-11-14 DIAGNOSIS — N6089 Other benign mammary dysplasias of unspecified breast: Secondary | ICD-10-CM

## 2012-11-14 DIAGNOSIS — Z79899 Other long term (current) drug therapy: Secondary | ICD-10-CM | POA: Insufficient documentation

## 2012-11-14 DIAGNOSIS — Z883 Allergy status to other anti-infective agents status: Secondary | ICD-10-CM | POA: Insufficient documentation

## 2012-11-14 DIAGNOSIS — N6320 Unspecified lump in the left breast, unspecified quadrant: Secondary | ICD-10-CM

## 2012-11-14 DIAGNOSIS — I509 Heart failure, unspecified: Secondary | ICD-10-CM | POA: Insufficient documentation

## 2012-11-14 DIAGNOSIS — I10 Essential (primary) hypertension: Secondary | ICD-10-CM | POA: Insufficient documentation

## 2012-11-14 DIAGNOSIS — G609 Hereditary and idiopathic neuropathy, unspecified: Secondary | ICD-10-CM | POA: Insufficient documentation

## 2012-11-14 DIAGNOSIS — R921 Mammographic calcification found on diagnostic imaging of breast: Secondary | ICD-10-CM

## 2012-11-14 DIAGNOSIS — H5440 Blindness, one eye, unspecified eye: Secondary | ICD-10-CM | POA: Insufficient documentation

## 2012-11-14 DIAGNOSIS — N049 Nephrotic syndrome with unspecified morphologic changes: Secondary | ICD-10-CM | POA: Insufficient documentation

## 2012-11-14 DIAGNOSIS — I2510 Atherosclerotic heart disease of native coronary artery without angina pectoris: Secondary | ICD-10-CM | POA: Insufficient documentation

## 2012-11-14 DIAGNOSIS — K219 Gastro-esophageal reflux disease without esophagitis: Secondary | ICD-10-CM | POA: Insufficient documentation

## 2012-11-14 DIAGNOSIS — E7800 Pure hypercholesterolemia, unspecified: Secondary | ICD-10-CM | POA: Insufficient documentation

## 2012-11-14 DIAGNOSIS — R920 Mammographic microcalcification found on diagnostic imaging of breast: Secondary | ICD-10-CM

## 2012-11-14 DIAGNOSIS — N289 Disorder of kidney and ureter, unspecified: Secondary | ICD-10-CM | POA: Insufficient documentation

## 2012-11-14 HISTORY — PX: BREAST LUMPECTOMY WITH NEEDLE LOCALIZATION: SHX5759

## 2012-11-14 LAB — GLUCOSE, CAPILLARY
Glucose-Capillary: 336 mg/dL — ABNORMAL HIGH (ref 70–99)
Glucose-Capillary: 287 mg/dL — ABNORMAL HIGH (ref 70–99)

## 2012-11-14 SURGERY — BREAST LUMPECTOMY WITH NEEDLE LOCALIZATION
Anesthesia: General | Site: Breast | Laterality: Left | Wound class: Clean

## 2012-11-14 MED ORDER — DEXTROSE 5 % IV SOLN
3.0000 g | INTRAVENOUS | Status: AC
  Administered 2012-11-14: 3 g via INTRAVENOUS
  Filled 2012-11-14: qty 3000

## 2012-11-14 MED ORDER — OXYCODONE HCL 5 MG PO TABS: 5.0000 mg | ORAL_TABLET | Freq: Once | ORAL | Status: DC | PRN

## 2012-11-14 MED ORDER — MIDAZOLAM HCL 5 MG/5ML IJ SOLN
INTRAMUSCULAR | Status: DC | PRN
  Administered 2012-11-14 (×2): 1 mg via INTRAVENOUS

## 2012-11-14 MED ORDER — BUPIVACAINE-EPINEPHRINE 0.25% -1:200000 IJ SOLN
INTRAMUSCULAR | Status: DC | PRN
  Administered 2012-11-14: 20 mL

## 2012-11-14 MED ORDER — LACTATED RINGERS IV SOLN
INTRAVENOUS | Status: DC | PRN
  Administered 2012-11-14: 12:00:00 via INTRAVENOUS

## 2012-11-14 MED ORDER — 0.9 % SODIUM CHLORIDE (POUR BTL) OPTIME
TOPICAL | Status: DC | PRN
  Administered 2012-11-14: 1000 mL

## 2012-11-14 MED ORDER — PHENYLEPHRINE HCL 10 MG/ML IJ SOLN
INTRAMUSCULAR | Status: DC | PRN
  Administered 2012-11-14 (×4): 40 ug via INTRAVENOUS

## 2012-11-14 MED ORDER — OXYCODONE-ACETAMINOPHEN 5-325 MG PO TABS: 1.0000 | ORAL_TABLET | ORAL | Status: DC | PRN

## 2012-11-14 MED ORDER — INSULIN ASPART 100 UNIT/ML ~~LOC~~ SOLN
5.0000 [IU] | Freq: Once | SUBCUTANEOUS | Status: AC
  Administered 2012-11-14: 5 [IU] via SUBCUTANEOUS

## 2012-11-14 MED ORDER — OXYCODONE HCL 5 MG/5ML PO SOLN: 5.0000 mg | Freq: Once | ORAL | Status: DC | PRN

## 2012-11-14 MED ORDER — SODIUM CHLORIDE 0.9 % IV SOLN
INTRAVENOUS | Status: DC | PRN
  Administered 2012-11-14: 10:00:00 via INTRAVENOUS

## 2012-11-14 MED ORDER — ARTIFICIAL TEARS OP OINT
TOPICAL_OINTMENT | OPHTHALMIC | Status: DC | PRN
  Administered 2012-11-14: 1 via OPHTHALMIC

## 2012-11-14 MED ORDER — BUPIVACAINE-EPINEPHRINE PF 0.25-1:200000 % IJ SOLN
INTRAMUSCULAR | Status: AC
  Filled 2012-11-14: qty 30

## 2012-11-14 MED ORDER — ONDANSETRON HCL 4 MG/2ML IJ SOLN
INTRAMUSCULAR | Status: DC | PRN
  Administered 2012-11-14: 4 mg via INTRAVENOUS

## 2012-11-14 MED ORDER — FENTANYL CITRATE 0.05 MG/ML IJ SOLN
INTRAMUSCULAR | Status: DC | PRN
  Administered 2012-11-14: 25 ug via INTRAVENOUS
  Administered 2012-11-14: 50 ug via INTRAVENOUS

## 2012-11-14 MED ORDER — PROPOFOL 10 MG/ML IV BOLUS
INTRAVENOUS | Status: DC | PRN
  Administered 2012-11-14: 200 mg via INTRAVENOUS

## 2012-11-14 MED ORDER — DEXTROSE 5 % IV SOLN
INTRAVENOUS | Status: DC | PRN
  Administered 2012-11-14: 12:00:00 via INTRAVENOUS

## 2012-11-14 MED ORDER — ONDANSETRON HCL 4 MG/2ML IJ SOLN: 4.0000 mg | Freq: Once | INTRAMUSCULAR | Status: DC | PRN

## 2012-11-14 MED ORDER — HYDROMORPHONE HCL PF 1 MG/ML IJ SOLN: 0.2500 mg | INTRAMUSCULAR | Status: DC | PRN

## 2012-11-14 MED ORDER — SODIUM CHLORIDE 0.9 % IV SOLN
INTRAVENOUS | Status: DC
  Administered 2012-11-14: 11:00:00 via INTRAVENOUS

## 2012-11-14 SURGICAL SUPPLY — 47 items
APPLIER CLIP 9.375 MED OPEN (MISCELLANEOUS)
BINDER BREAST LRG (GAUZE/BANDAGES/DRESSINGS) IMPLANT
BINDER BREAST XLRG (GAUZE/BANDAGES/DRESSINGS) IMPLANT
BLADE SURG 15 STRL LF DISP TIS (BLADE) ×1 IMPLANT
BLADE SURG 15 STRL SS (BLADE) ×1
CANISTER SUCTION 2500CC (MISCELLANEOUS) IMPLANT
CHLORAPREP W/TINT 26ML (MISCELLANEOUS) ×2 IMPLANT
CLIP APPLIE 9.375 MED OPEN (MISCELLANEOUS) IMPLANT
CLOTH BEACON ORANGE TIMEOUT ST (SAFETY) ×2 IMPLANT
COVER SURGICAL LIGHT HANDLE (MISCELLANEOUS) ×2 IMPLANT
DERMABOND ADVANCED (GAUZE/BANDAGES/DRESSINGS) ×1
DERMABOND ADVANCED .7 DNX12 (GAUZE/BANDAGES/DRESSINGS) ×1 IMPLANT
DEVICE DUBIN SPECIMEN MAMMOGRA (MISCELLANEOUS) ×2 IMPLANT
DRAPE CHEST BREAST 15X10 FENES (DRAPES) ×2 IMPLANT
DRAPE UTILITY 15X26 W/TAPE STR (DRAPE) ×4 IMPLANT
ELECT CAUTERY BLADE 6.4 (BLADE) ×2 IMPLANT
ELECT REM PT RETURN 9FT ADLT (ELECTROSURGICAL) ×2
ELECTRODE REM PT RTRN 9FT ADLT (ELECTROSURGICAL) ×1 IMPLANT
GLOVE BIO SURGEON STRL SZ7 (GLOVE) ×2 IMPLANT
GLOVE BIO SURGEON STRL SZ8 (GLOVE) ×2 IMPLANT
GLOVE BIOGEL PI IND STRL 7.0 (GLOVE) ×1 IMPLANT
GLOVE BIOGEL PI IND STRL 7.5 (GLOVE) ×1 IMPLANT
GLOVE BIOGEL PI IND STRL 8 (GLOVE) ×1 IMPLANT
GLOVE BIOGEL PI INDICATOR 7.0 (GLOVE) ×1
GLOVE BIOGEL PI INDICATOR 7.5 (GLOVE) ×1
GLOVE BIOGEL PI INDICATOR 8 (GLOVE) ×1
GOWN STRL NON-REIN LRG LVL3 (GOWN DISPOSABLE) ×4 IMPLANT
GOWN STRL REIN XL XLG (GOWN DISPOSABLE) ×2 IMPLANT
KIT BASIN OR (CUSTOM PROCEDURE TRAY) ×2 IMPLANT
KIT MARKER MARGIN INK (KITS) IMPLANT
KIT ROOM TURNOVER OR (KITS) ×2 IMPLANT
NEEDLE HYPO 25GX1X1/2 BEV (NEEDLE) ×2 IMPLANT
NS IRRIG 1000ML POUR BTL (IV SOLUTION) ×2 IMPLANT
PACK SURGICAL SETUP 50X90 (CUSTOM PROCEDURE TRAY) ×2 IMPLANT
PAD ARMBOARD 7.5X6 YLW CONV (MISCELLANEOUS) ×2 IMPLANT
PENCIL BUTTON HOLSTER BLD 10FT (ELECTRODE) ×2 IMPLANT
SPONGE LAP 4X18 X RAY DECT (DISPOSABLE) ×2 IMPLANT
SUT MON AB 4-0 PC3 18 (SUTURE) ×2 IMPLANT
SUT SILK 2 0 SH (SUTURE) IMPLANT
SUT VIC AB 3-0 SH 27 (SUTURE) ×1
SUT VIC AB 3-0 SH 27XBRD (SUTURE) ×1 IMPLANT
SYR BULB 3OZ (MISCELLANEOUS) ×2 IMPLANT
SYR CONTROL 10ML LL (SYRINGE) ×2 IMPLANT
TOWEL OR 17X24 6PK STRL BLUE (TOWEL DISPOSABLE) ×2 IMPLANT
TOWEL OR 17X26 10 PK STRL BLUE (TOWEL DISPOSABLE) ×2 IMPLANT
TUBE CONNECTING 12X1/4 (SUCTIONS) IMPLANT
YANKAUER SUCT BULB TIP NO VENT (SUCTIONS) IMPLANT

## 2012-11-14 NOTE — H&P (View-Only) (Signed)
Patient ID: Hannah [name], female   DOB: 09/24/1970, 42 y.o.   MRN: [id number]  Chief Complaint  Patient presents with  . Routine Post Op    re eval for breast bx    HPI Hannah [name] is a 42 y.o. female.  The patient sent at the request of Dr.Lin 4 suspicious left breast microcalcifications detected on diagnostic mammography. These are not amenable to stereotactic core biopsy. Denies any history of breast pain, breast mass, or nipple discharge. 1 first-degree and breast cancer. She has a history of lung cancer status post surgical resection. Pt scheduled for earlier this year but glucose levels were too high.  She has better control of this now.   Here to reschedule.  HPI  Past Medical History  Diagnosis Date  . High cholesterol   . Diabetic retinopathy   . Peripheral neuropathy     "tips of toes"  . Blind right eye   . Cardiomyopathy   . CHF (congestive heart failure)   . CAD (coronary artery disease)     NO STENTS  . GERD (gastroesophageal reflux disease)   . Hypertension     under control with med., has been on med. x 1 yr.  . History of lung cancer 07/2011    s/p left lower lobectomy  . Diabetes mellitus     IDDM  . Runny nose 07/10/2012    clear drainage  . Cataract of right eye   . Ulcer of toe of right foot 07/10/2012    great toe  . Breast calcification, left 06/2012  . Nephrotic syndrome   . Nephrotic range proteinuria   . Cancer     Adenocarcinoma of lung  . Acute biphenotypic leukemia     Past Surgical History  Procedure Laterality Date  . Cardiac catheterization  07/16/2011  . Incision and drainage breast abscess Left   . Tubal ligation  1994  . Vitrectomy  2010    2 on left, 1 on right  . Cesarean section  1991; 1994  . Video assisted thoracoscopy (vats)/ lobectomy Left 07/30/2011    left main thoracotomy, left lower lobectomy, mediastinal lymph node dissection  . Lobectomy    . Colonoscopy with esophagogastroduodenoscopy (egd) N/A 08/14/2012   Procedure: COLONOSCOPY WITH ESOPHAGOGASTRODUODENOSCOPY (EGD);  Surgeon: Sandi L Fields, MD;  Location: [facility];  Service: Endoscopy;  Laterality: N/A;  10:45-moved to [address] to notify pt    Family History  Problem Relation Age of Onset  . Coronary artery disease Father   . Asthma Father   . COPD Father   . Hypertension Father   . Hyperlipidemia Father   . Diabetes Father   . Congestive Heart Failure Father   . Heart disease Father   . Heart attack Father   . Peripheral vascular disease Father   . Hypertension Mother   . Hyperlipidemia Mother   . Diabetes Mother   . Cancer Mother   . Cancer Maternal Aunt      three aunts, bone, breast, ?  . Hypertension Brother   . Diabetes Brother   . Diabetes Sister   . Colon cancer Neg Hx   . Celiac disease Neg Hx   . Crohn's disease Neg Hx   . Ulcerative colitis Neg Hx   . Lung cancer Maternal Grandmother     Social History History  Substance Use Topics  . Smoking status: Former Smoker -- 2.00 packs/day for 30 years    Quit date: 08/01/2011  .   Smokeless tobacco: Never Used  . Alcohol Use: No    Allergies  Allergen Reactions  . Ciprofloxacin Rash    Current Outpatient Prescriptions  Medication Sig Dispense Refill  . insulin detemir (LEVEMIR) 100 UNIT/ML injection Inject 0.4 mLs (40 Units total) into the skin at bedtime. Inject 45 units into skin at bedtime  10 mL  11  . insulin lispro (HUMALOG) 100 UNIT/ML injection Inject 10 units subcutaneously with each meal.  10 mL  1  . metolazone (ZAROXOLYN) 2.5 MG tablet       . metoprolol succinate (TOPROL XL) 25 MG 24 hr tablet Take 1 tablet (25 mg total) by mouth daily.  30 tablet  3  . torsemide (DEMADEX) 20 MG tablet Take 2 tablets (40 mg total) by mouth daily.  60 tablet  1   No current facility-administered medications for this visit.    Review of Systems Review of Systems  Constitutional: Positive for fatigue.  HENT: Negative.   Eyes: Negative.   Respiratory:  Positive for shortness of breath.   Cardiovascular: Negative.   Gastrointestinal: Negative.   Endocrine: Negative.   Genitourinary: Negative.   Neurological: Negative.   Hematological: Negative.   Psychiatric/Behavioral: Negative.     Blood pressure 136/78, pulse 82, resp. rate 16, height 5' 7" (1.702 m), weight 253 lb 6.4 oz (114.941 kg).  Physical Exam Physical Exam  Constitutional: She is oriented to person, place, and time. She appears well-developed and well-nourished.  HENT:  Head: Normocephalic and atraumatic.  Eyes: EOM are normal. Pupils are equal, round, and reactive to light.  Neck: Normal range of motion. Neck supple.  Cardiovascular: Normal rate and regular rhythm.   Pulmonary/Chest: Effort normal. Right breast exhibits no inverted nipple, no mass, no nipple discharge, no skin change and no tenderness. Left breast exhibits no inverted nipple, no mass, no nipple discharge, no skin change and no tenderness.  Neurological: She is alert and oriented to person, place, and time.  Skin: Skin is warm and dry.  Psychiatric: She has a normal mood and affect. Her behavior is normal. Judgment and thought content normal.    Data Reviewed s *RADIOLOGY REPORT*  Clinical Data: Abnormal left screening mammogram  DIGITAL DIAGNOSTIC LEFT MAMMOGRAM WITH CAD  Comparison: 12/05/2008 [facility]  Findings:  ACR Breast Density Category 3: The breast tissue is heterogeneously  dense.  Additional imaging of the the left breast shows a developing 7 mm  cluster of calcifications that vary in size and shape. Tissue  sampling is recommended.  Mammographic images were processed with CAD.  IMPRESSION:  Suspicious left breast calcifications. Tissue sampling is  recommended.  RECOMMENDATION:  Stereotactic biopsy will be scheduled at the patient's convenience.  I have discussed the findings and recommendations with the patient.  Results were also provided in writing at the  conclusion of the  visit.  BI-RADS CATEGORY 4: Suspicious abnormality - biopsy should be  considered.    Assessment    Left breast suspicious microcalcifications 7 mm not amenable to stereotactic biopsy    Plan    Discussed observation versus surgical excision. Patient would like to proceed with left breast needle localized lumpectomy. The procedure has been discussed with the patient. Alternatives to surgery have been discussed with the patient.  Risks of surgery include bleeding,  Infection,  Seroma formation, death,  and the need for further surgery.   The patient understands and wishes to proceed.       Maricsa Sammons A. 10/27/2012, 10:48 AM    

## 2012-11-14 NOTE — Transfer of Care (Signed)
Immediate Anesthesia Transfer of Care Note  Patient: Hannah [name]  Procedure(s) Performed: Procedure(s): BREAST LUMPECTOMY WITH NEEDLE LOCALIZATION (Left)  Patient Location: PACU  Anesthesia Type:General  Level of Consciousness: awake, alert , oriented and patient cooperative  Airway & Oxygen Therapy: Patient Spontanous Breathing and Patient connected to nasal cannula oxygen  Post-op Assessment: Report given to PACU RN, Post -op Vital signs reviewed and stable and Patient moving all extremities  Post vital signs: Reviewed and stable  Complications: No apparent anesthesia complications

## 2012-11-14 NOTE — Anesthesia Preprocedure Evaluation (Addendum)
Anesthesia Evaluation  Patient identified by MRN, date of birth, ID band Patient awake    Reviewed: Allergy & Precautions, H&P , NPO status , Patient's Chart, lab work & pertinent test results, reviewed documented beta blocker date and time   Airway Mallampati: I TM Distance: >3 FB Neck ROM: Full    Dental  (+) Teeth Intact, Dental Advisory Given and Poor Dentition   Pulmonary  Hx lobectomy on left in 2013 for lung ca. breath sounds clear to auscultation        Cardiovascular hypertension, Pt. on medications and Pt. on home beta blockers + CAD and +CHF Rhythm:Regular Rate:Normal     Neuro/Psych    GI/Hepatic GERD-  Medicated and Controlled,  Endo/Other  diabetes, Poorly Controlled, Type 2, Insulin DependentMorbid obesity  Renal/GU Renal InsufficiencyRenal disease     Musculoskeletal   Abdominal   Peds  Hematology   Anesthesia Other Findings Two broken molars...  Reproductive/Obstetrics                          Anesthesia Physical Anesthesia Plan  ASA: III  Anesthesia Plan: General   Post-op Pain Management:    Induction: Intravenous  Airway Management Planned: LMA  Additional Equipment:   Intra-op Plan:   Post-operative Plan: Extubation in OR  Informed Consent: I have reviewed the patients History and Physical, chart, labs and discussed the procedure including the risks, benefits and alternatives for the proposed anesthesia with the patient or authorized representative who has indicated his/her understanding and acceptance.   Dental advisory given  Plan Discussed with: CRNA, Anesthesiologist and Surgeon  Anesthesia Plan Comments:         Anesthesia Quick Evaluation

## 2012-11-14 NOTE — Anesthesia Procedure Notes (Signed)
Procedure Name: LMA Insertion Date/Time: 11/14/2012 11:46 AM Performed by: Darcey Nora B Pre-anesthesia Checklist: Patient identified, Emergency Drugs available, Suction available and Patient being monitored Patient Re-evaluated:Patient Re-evaluated prior to inductionOxygen Delivery Method: Circle system utilized Preoxygenation: Pre-oxygenation with 100% oxygen Intubation Type: IV induction Ventilation: Mask ventilation without difficulty LMA: LMA inserted LMA Size: 4.0 Number of attempts: 2 (had to pull tongue forward with tongue blade) Placement Confirmation: breath sounds checked- equal and bilateral and positive ETCO2 Tube secured with: Tape (across cheeks) Dental Injury: Teeth and Oropharynx as per pre-operative assessment

## 2012-11-14 NOTE — Interval H&P Note (Signed)
History and Physical Interval Note:  11/14/2012 10:50 AM  Hannah [name]  has presented today for surgery, with the diagnosis of left breast microcalcifications  The various methods of treatment have been discussed with the patient and family. After consideration of risks, benefits and other options for treatment, the patient has consented to  Procedure(s): BREAST LUMPECTOMY WITH NEEDLE LOCALIZATION (Left) as a surgical intervention .  The patient's history has been reviewed, patient examined, no change in status, stable for surgery.  I have reviewed the patient's chart and labs.  Questions were answered to the patient's satisfaction.     Nikole Swartzentruber A.

## 2012-11-14 NOTE — Op Note (Signed)
Preoperative diagnosis: left breast microcalcifications   Postop diagnosis: Same  Procedure: left breast lumpectomy with wire localization  Surgeon: Harriette Bouillon M.D.  Anesthesia: LMA with 0.25% Sensorcaine local  EBL: Less than 40 cc  Specimen: Breast calcifications  with wire and clip verified by radiography to pathology  Drains: None  Indications for procedure: The patient presents with suspicious left breast calcifications. Pt too large for stereotactic core biopsy. The patient want  to proceed with lumpectomy  with wire localization.  Description of procedure: The patient was seen in the holding area and the appropriate side was marked. Questions are answered. Wire localization was done by  radiology. The patient was taken back to the operating room and placed supine on the operating room table. After induction of general anesthesia,  Left chest and upper arm  were prepped and draped in a sterile fashion. Timeout was done and she received preoperative antibiotics. Curvilinear incision was made around the wire insertion site. All tissue around the wire was excised and hemostasis was achieved with cautery. The area was removed in its entirety upon gross examination. Gross margin negative. Radiograph revealed the calcifications, wire and clip to be in the specimen. The wound was closed in layers using 3-0 Vicryl and 4-0 Monocryl subcuticular stitch. Dermabond applied. All final counts found to be correct. Patient awoke extubated taken recovery in satisfactory condition.

## 2012-11-14 NOTE — Preoperative (Signed)
Beta Blockers   Reason not to administer Beta Blockers:Toprol 0730 today

## 2012-11-15 NOTE — Anesthesia Postprocedure Evaluation (Signed)
  Anesthesia Post-op Note  Patient: Hannah [name]  Procedure(s) Performed: Procedure(s): BREAST LUMPECTOMY WITH NEEDLE LOCALIZATION (Left)  Patient Location: PACU  Anesthesia Type:General  Level of Consciousness: awake, alert  and oriented  Airway and Oxygen Therapy: Patient Spontanous Breathing  Post-op Pain: mild  Post-op Assessment: Post-op Vital signs reviewed  Post-op Vital Signs: Reviewed  Complications: No apparent anesthesia complications

## 2012-11-17 ENCOUNTER — Encounter (HOSPITAL_COMMUNITY): Payer: Self-pay | Admitting: Surgery

## 2012-11-17 ENCOUNTER — Telehealth (INDEPENDENT_AMBULATORY_CARE_PROVIDER_SITE_OTHER): Payer: Self-pay

## 2012-11-17 ENCOUNTER — Encounter (INDEPENDENT_AMBULATORY_CARE_PROVIDER_SITE_OTHER): Payer: Medicare Other | Admitting: Surgery

## 2012-11-17 ENCOUNTER — Encounter (INDEPENDENT_AMBULATORY_CARE_PROVIDER_SITE_OTHER): Payer: Self-pay | Admitting: Surgery

## 2012-11-17 NOTE — Telephone Encounter (Signed)
Message copied by Brennan Bailey on Fri Nov 17, 2012  3:33 PM ------      Message from: Harriette Bouillon A      Created: Fri Nov 17, 2012  3:15 PM       No cancer .  Looks ok ------

## 2012-11-17 NOTE — Telephone Encounter (Signed)
LMOM> Path benign 

## 2012-11-21 ENCOUNTER — Encounter (INDEPENDENT_AMBULATORY_CARE_PROVIDER_SITE_OTHER): Payer: Self-pay | Admitting: Surgery

## 2012-11-22 ENCOUNTER — Encounter: Payer: Self-pay | Admitting: Family Medicine

## 2012-11-22 ENCOUNTER — Ambulatory Visit (INDEPENDENT_AMBULATORY_CARE_PROVIDER_SITE_OTHER): Payer: Medicare Other | Admitting: Family Medicine

## 2012-11-22 VITALS — BP 136/80 | HR 84 | Temp 97.6°F | Resp 18 | Wt 253.0 lb

## 2012-11-22 DIAGNOSIS — N183 Chronic kidney disease, stage 3 unspecified: Secondary | ICD-10-CM

## 2012-11-22 DIAGNOSIS — E785 Hyperlipidemia, unspecified: Secondary | ICD-10-CM

## 2012-11-22 DIAGNOSIS — R252 Cramp and spasm: Secondary | ICD-10-CM

## 2012-11-22 DIAGNOSIS — E119 Type 2 diabetes mellitus without complications: Secondary | ICD-10-CM

## 2012-11-22 DIAGNOSIS — E559 Vitamin D deficiency, unspecified: Secondary | ICD-10-CM

## 2012-11-22 DIAGNOSIS — I10 Essential (primary) hypertension: Secondary | ICD-10-CM

## 2012-11-22 MED ORDER — INSULIN PEN NEEDLE 31G X 8 MM MISC: Status: DC

## 2012-11-22 MED ORDER — INSULIN LISPRO 100 UNIT/ML (KWIKPEN): PEN_INJECTOR | SUBCUTANEOUS | Status: DC

## 2012-11-22 MED ORDER — POTASSIUM CHLORIDE CRYS ER 20 MEQ PO TBCR: 20.0000 meq | EXTENDED_RELEASE_TABLET | Freq: Every day | ORAL | Status: DC

## 2012-11-22 MED ORDER — INSULIN DETEMIR 100 UNIT/ML FLEXPEN: PEN_INJECTOR | SUBCUTANEOUS | Status: DC

## 2012-11-22 NOTE — Assessment & Plan Note (Signed)
I will add low-dose potassium with her diuretic. When she does increase her diuretic her cramping is more severe. Her last the labs showed some mild hypokalemia this will also be rechecked today

## 2012-11-22 NOTE — Assessment & Plan Note (Signed)
Reviewed last nephrology note. She's due to have biopsy done

## 2012-11-22 NOTE — Progress Notes (Signed)
  Subjective:    Patient ID: Hannah [name], female    DOB: 08/24/1970, 42 y.o.   MRN: [id number]  HPI  Patient here to followup chronic medical problems. She's complaining of leg cramps as well as hand cramps. They're worse when she increases her diuretic as well as at nighttime. Diabetes mellitus she's currently giving herself Levemir 45 units and Humalog 10 units with each meal. Her blood sugars after meals are still in the 200s her fasting blood sugars are 120 to 130s. He denies any hypoglycemia. She recently had biopsy done on her breast which was benign for cancer. She was also recently seen by her nephrologist and they're planning to do biopsy.  Review of Systems  GEN- denies fatigue, fever, weight loss,weakness, recent illness HEENT- denies eye drainage, change in vision, nasal discharge, CVS- denies chest pain, palpitations RESP- denies SOB, cough, wheeze ABD- denies N/V, change in stools, abd pain MSK- denies joint pain, muscle aches, injury Neuro- denies headache, dizziness, syncope, seizure activity +cramps  Endocrine- denies polyuria, polydipsia      Objective:   Physical Exam GEN- NAD, alert and oriented x3, obese HEENT- no vision right eye, left eye reactive, MMM, oropharynx clear CVS-RRR, no murmur RESP-CTAB, ABD-NABS,soft,NT,ND Ext- no edema, pulse equal bilat        Assessment & Plan:

## 2012-11-22 NOTE — Assessment & Plan Note (Signed)
Diabetes is uncontrolled but improving. I will increase her mealtime coverage of hemologic to 12 units with each meal she will continue Levemir 45 units I will refer her to endocrinology again

## 2012-11-22 NOTE — Patient Instructions (Addendum)
We will call with results Referral to Dr. Fransico Him Start the potassium  New insulin sent Reschedule with Dr. Jens Som F/U 3 months

## 2012-11-22 NOTE — Assessment & Plan Note (Signed)
Blood pressure looks okay. We'll continue current regimen

## 2012-11-23 LAB — COMPREHENSIVE METABOLIC PANEL
AST: 13 U/L (ref 0–37)
Albumin: 2.5 g/dL — ABNORMAL LOW (ref 3.5–5.2)
Alkaline Phosphatase: 65 U/L (ref 39–117)
Calcium: 8.6 mg/dL (ref 8.4–10.5)
Chloride: 100 mEq/L (ref 96–112)
Potassium: 4 mEq/L (ref 3.5–5.3)
Sodium: 136 mEq/L (ref 135–145)
Total Protein: 4.9 g/dL — ABNORMAL LOW (ref 6.0–8.3)

## 2012-11-23 LAB — CBC WITH DIFFERENTIAL/PLATELET
Basophils Absolute: 0.1 10*3/uL (ref 0.0–0.1)
Basophils Relative: 1 % (ref 0–1)
Eosinophils Absolute: 0.2 10*3/uL (ref 0.0–0.7)
MCH: 29.7 pg (ref 26.0–34.0)
MCHC: 31.5 g/dL (ref 30.0–36.0)
Neutrophils Relative %: 66 % (ref 43–77)
Platelets: 343 10*3/uL (ref 150–400)
RDW: 15.2 % (ref 11.5–15.5)

## 2012-11-23 LAB — LIPID PANEL: Cholesterol: 308 mg/dL — ABNORMAL HIGH (ref 0–200)

## 2012-11-24 ENCOUNTER — Telehealth: Payer: Self-pay | Admitting: Family Medicine

## 2012-11-24 ENCOUNTER — Other Ambulatory Visit (HOSPITAL_COMMUNITY): Payer: Self-pay | Admitting: Nephrology

## 2012-11-24 DIAGNOSIS — R791 Abnormal coagulation profile: Secondary | ICD-10-CM

## 2012-11-24 DIAGNOSIS — R809 Proteinuria, unspecified: Secondary | ICD-10-CM

## 2012-11-24 MED ORDER — GLUCOSE BLOOD VI STRP: ORAL_STRIP | Status: DC

## 2012-11-24 MED ORDER — ATORVASTATIN CALCIUM 20 MG PO TABS: 20.0000 mg | ORAL_TABLET | Freq: Every day | ORAL | Status: DC

## 2012-11-24 NOTE — Telephone Encounter (Signed)
Meds refilled.

## 2012-11-24 NOTE — Addendum Note (Signed)
Addended by: Milinda Antis F on: 11/24/2012 03:26 PM   Modules accepted: Orders

## 2012-11-27 ENCOUNTER — Ambulatory Visit (INDEPENDENT_AMBULATORY_CARE_PROVIDER_SITE_OTHER): Payer: Medicare Other | Admitting: Surgery

## 2012-11-27 ENCOUNTER — Encounter (INDEPENDENT_AMBULATORY_CARE_PROVIDER_SITE_OTHER): Payer: Self-pay | Admitting: Surgery

## 2012-11-27 ENCOUNTER — Telehealth: Payer: Self-pay | Admitting: Family Medicine

## 2012-11-27 VITALS — BP 132/78 | HR 66 | Temp 98.2°F | Resp 14 | Ht 67.0 in | Wt 256.0 lb

## 2012-11-27 DIAGNOSIS — Z9889 Other specified postprocedural states: Secondary | ICD-10-CM

## 2012-11-27 MED ORDER — METOPROLOL SUCCINATE ER 25 MG PO TB24: 25.0000 mg | ORAL_TABLET | Freq: Every day | ORAL | Status: DC

## 2012-11-27 NOTE — Progress Notes (Signed)
Patient returns after left breast needle localized lumpectomy. Pathology showed atypical ductal hyperplasia and microcalcifications. She has no complaints.  Exam: Left breast incision clean dry and intact without signs of infection, seroma or hematoma.  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  Resume full activity.  Yearly mammography  Return as needed.

## 2012-11-27 NOTE — Patient Instructions (Signed)
Will refer to high risk clinic at  [facility].  Return as needed.

## 2012-11-27 NOTE — Telephone Encounter (Signed)
Meds refilled.

## 2012-11-27 NOTE — Telephone Encounter (Signed)
Metoprolol ER 25 mg tab 1 QD #30

## 2012-12-04 ENCOUNTER — Encounter (HOSPITAL_COMMUNITY): Payer: Self-pay

## 2012-12-04 ENCOUNTER — Encounter (HOSPITAL_COMMUNITY)
Admission: RE | Admit: 2012-12-04 | Discharge: 2012-12-04 | Disposition: A | Discharge status: Home / Self Care | Payer: Medicare Other | Source: Ambulatory Visit | Attending: Nephrology | Admitting: Nephrology

## 2012-12-04 LAB — CBC WITH DIFFERENTIAL/PLATELET
Basophils Relative: 2 % — ABNORMAL HIGH (ref 0–1)
Eosinophils Absolute: 0.3 10*3/uL (ref 0.0–0.7)
HCT: 36.4 % (ref 36.0–46.0)
Hemoglobin: 12.9 g/dL (ref 12.0–15.0)
Lymphs Abs: 2.3 10*3/uL (ref 0.7–4.0)
MCH: 31.9 pg (ref 26.0–34.0)
MCHC: 35.4 g/dL (ref 30.0–36.0)
Monocytes Absolute: 0.4 10*3/uL (ref 0.1–1.0)
Monocytes Relative: 5 % (ref 3–12)

## 2012-12-04 LAB — COMPREHENSIVE METABOLIC PANEL
ALT: 13 U/L (ref 0–35)
AST: 12 U/L (ref 0–37)
Alkaline Phosphatase: 71 U/L (ref 39–117)
CO2: 25 mEq/L (ref 19–32)
GFR calc Af Amer: 29 mL/min — ABNORMAL LOW (ref >90)
Glucose, Bld: 141 mg/dL — ABNORMAL HIGH (ref 70–99)
Potassium: 3.2 mEq/L — ABNORMAL LOW (ref 3.5–5.1)
Sodium: 138 mEq/L (ref 135–145)
Total Protein: 5.6 g/dL — ABNORMAL LOW (ref 6.0–8.3)

## 2012-12-04 LAB — PREPARE RBC (CROSSMATCH)

## 2012-12-04 LAB — APTT: aPTT: 30 seconds (ref 24–37)

## 2012-12-04 LAB — PROTIME-INR: Prothrombin Time: 11.9 seconds (ref 11.6–15.2)

## 2012-12-05 ENCOUNTER — Inpatient Hospital Stay (HOSPITAL_COMMUNITY)
Admission: RE | Admit: 2012-12-05 | Discharge: 2012-12-06 | DRG: 699 | Disposition: A | Discharge status: Home / Self Care | Payer: Medicare Other | Source: Ambulatory Visit | Attending: Nephrology | Admitting: Nephrology

## 2012-12-05 ENCOUNTER — Encounter (HOSPITAL_COMMUNITY): Payer: Self-pay

## 2012-12-05 VITALS — BP 116/70 | HR 90 | Temp 98.5°F | Resp 16 | Ht 67.0 in | Wt 259.6 lb

## 2012-12-05 DIAGNOSIS — E7800 Pure hypercholesterolemia, unspecified: Secondary | ICD-10-CM | POA: Diagnosis present

## 2012-12-05 DIAGNOSIS — E11319 Type 2 diabetes mellitus with unspecified diabetic retinopathy without macular edema: Secondary | ICD-10-CM | POA: Diagnosis present

## 2012-12-05 DIAGNOSIS — Z01812 Encounter for preprocedural laboratory examination: Secondary | ICD-10-CM

## 2012-12-05 DIAGNOSIS — I428 Other cardiomyopathies: Secondary | ICD-10-CM | POA: Diagnosis present

## 2012-12-05 DIAGNOSIS — I509 Heart failure, unspecified: Secondary | ICD-10-CM | POA: Diagnosis present

## 2012-12-05 DIAGNOSIS — G609 Hereditary and idiopathic neuropathy, unspecified: Secondary | ICD-10-CM | POA: Diagnosis present

## 2012-12-05 DIAGNOSIS — R809 Proteinuria, unspecified: Secondary | ICD-10-CM

## 2012-12-05 DIAGNOSIS — H5440 Blindness, one eye, unspecified eye: Secondary | ICD-10-CM | POA: Diagnosis present

## 2012-12-05 DIAGNOSIS — E1139 Type 2 diabetes mellitus with other diabetic ophthalmic complication: Secondary | ICD-10-CM | POA: Diagnosis present

## 2012-12-05 DIAGNOSIS — E669 Obesity, unspecified: Secondary | ICD-10-CM | POA: Diagnosis present

## 2012-12-05 DIAGNOSIS — I129 Hypertensive chronic kidney disease with stage 1 through stage 4 chronic kidney disease, or unspecified chronic kidney disease: Secondary | ICD-10-CM | POA: Diagnosis present

## 2012-12-05 DIAGNOSIS — N189 Chronic kidney disease, unspecified: Secondary | ICD-10-CM | POA: Diagnosis present

## 2012-12-05 DIAGNOSIS — N63 Unspecified lump in unspecified breast: Secondary | ICD-10-CM | POA: Diagnosis present

## 2012-12-05 DIAGNOSIS — I2510 Atherosclerotic heart disease of native coronary artery without angina pectoris: Secondary | ICD-10-CM | POA: Diagnosis present

## 2012-12-05 DIAGNOSIS — N049 Nephrotic syndrome with unspecified morphologic changes: Principal | ICD-10-CM | POA: Diagnosis present

## 2012-12-05 DIAGNOSIS — Z85118 Personal history of other malignant neoplasm of bronchus and lung: Secondary | ICD-10-CM

## 2012-12-05 LAB — CBC
HCT: 35.4 % — ABNORMAL LOW (ref 36.0–46.0)
MCH: 30.9 pg (ref 26.0–34.0)
MCHC: 34.5 g/dL (ref 30.0–36.0)
MCV: 89.6 fL (ref 78.0–100.0)
RDW: 14 % (ref 11.5–15.5)
WBC: 7 10*3/uL (ref 4.0–10.5)

## 2012-12-05 LAB — GLUCOSE, CAPILLARY: Glucose-Capillary: 183 mg/dL — ABNORMAL HIGH (ref 70–99)

## 2012-12-05 MED ORDER — SODIUM CHLORIDE 0.9 % IV SOLN: INTRAVENOUS | Status: DC

## 2012-12-05 MED ORDER — ONDANSETRON HCL 4 MG/2ML IJ SOLN: 4.0000 mg | Freq: Four times a day (QID) | INTRAMUSCULAR | Status: DC | PRN

## 2012-12-05 MED ORDER — ACETAMINOPHEN 500 MG PO TABS: 500.0000 mg | ORAL_TABLET | Freq: Three times a day (TID) | ORAL | Status: DC | PRN

## 2012-12-05 MED ORDER — DOCUSATE SODIUM 283 MG RE ENEM
1.0000 | ENEMA | RECTAL | Status: DC | PRN
  Filled 2012-12-05: qty 1

## 2012-12-05 MED ORDER — INSULIN ASPART 100 UNIT/ML ~~LOC~~ SOLN
0.0000 [IU] | Freq: Three times a day (TID) | SUBCUTANEOUS | Status: DC
  Administered 2012-12-05 – 2012-12-06 (×2): 2 [IU] via SUBCUTANEOUS

## 2012-12-05 MED ORDER — METOPROLOL SUCCINATE ER 25 MG PO TB24
25.0000 mg | ORAL_TABLET | Freq: Every day | ORAL | Status: DC
  Administered 2012-12-06: 25 mg via ORAL
  Filled 2012-12-05: qty 1

## 2012-12-05 MED ORDER — INSULIN DETEMIR 100 UNIT/ML ~~LOC~~ SOLN
45.0000 [IU] | Freq: Every day | SUBCUTANEOUS | Status: DC
  Administered 2012-12-05: 45 [IU] via SUBCUTANEOUS
  Filled 2012-12-05 (×2): qty 0.45

## 2012-12-05 MED ORDER — ACETAMINOPHEN 325 MG PO TABS: 650.0000 mg | ORAL_TABLET | Freq: Four times a day (QID) | ORAL | Status: DC | PRN

## 2012-12-05 MED ORDER — NEPRO/CARBSTEADY PO LIQD
237.0000 mL | Freq: Three times a day (TID) | ORAL | Status: DC | PRN
  Filled 2012-12-05: qty 237

## 2012-12-05 MED ORDER — POLYVINYL ALCOHOL 1.4 % OP SOLN
1.0000 [drp] | Freq: Every day | OPHTHALMIC | Status: DC
  Administered 2012-12-05: 1 [drp] via OPHTHALMIC
  Filled 2012-12-05: qty 15

## 2012-12-05 MED ORDER — LORAZEPAM 1 MG PO TABS
2.0000 mg | ORAL_TABLET | Freq: Once | ORAL | Status: AC
  Administered 2012-12-05: 2 mg via ORAL

## 2012-12-05 MED ORDER — ZOLPIDEM TARTRATE 5 MG PO TABS: 5.0000 mg | ORAL_TABLET | Freq: Every evening | ORAL | Status: DC | PRN

## 2012-12-05 MED ORDER — TORSEMIDE 20 MG PO TABS
20.0000 mg | ORAL_TABLET | Freq: Every day | ORAL | Status: DC
  Administered 2012-12-06: 20 mg via ORAL
  Filled 2012-12-05: qty 1

## 2012-12-05 MED ORDER — SORBITOL 70 % SOLN
30.0000 mL | Status: DC | PRN
  Filled 2012-12-05: qty 30

## 2012-12-05 MED ORDER — POTASSIUM CHLORIDE CRYS ER 20 MEQ PO TBCR
20.0000 meq | EXTENDED_RELEASE_TABLET | Freq: Every day | ORAL | Status: DC
  Administered 2012-12-06: 20 meq via ORAL
  Filled 2012-12-05: qty 1

## 2012-12-05 MED ORDER — HYDROXYZINE HCL 25 MG PO TABS: 25.0000 mg | ORAL_TABLET | Freq: Three times a day (TID) | ORAL | Status: DC | PRN

## 2012-12-05 MED ORDER — CALCIUM CARBONATE 1250 MG/5ML PO SUSP: 500.0000 mg | Freq: Four times a day (QID) | ORAL | Status: DC | PRN

## 2012-12-05 MED ORDER — LORAZEPAM 1 MG PO TABS
ORAL_TABLET | ORAL | Status: AC
  Filled 2012-12-05: qty 2

## 2012-12-05 MED ORDER — HYPROMELLOSE (GONIOSCOPIC) 2.5 % OP SOLN
1.0000 [drp] | Freq: Every day | OPHTHALMIC | Status: DC
  Filled 2012-12-05 (×2): qty 15

## 2012-12-05 MED ORDER — CAMPHOR-MENTHOL 0.5-0.5 % EX LOTN
1.0000 "application " | TOPICAL_LOTION | Freq: Three times a day (TID) | CUTANEOUS | Status: DC | PRN
  Filled 2012-12-05: qty 222

## 2012-12-05 MED ORDER — ONDANSETRON HCL 4 MG PO TABS: 4.0000 mg | ORAL_TABLET | Freq: Four times a day (QID) | ORAL | Status: DC | PRN

## 2012-12-05 MED ORDER — ACETAMINOPHEN 650 MG RE SUPP: 650.0000 mg | Freq: Four times a day (QID) | RECTAL | Status: DC | PRN

## 2012-12-05 NOTE — H&P (Signed)
Hannah [name] is an 42 y.o. female.  1. Admitting Diagnosis:  Nephrotic syndrome, AKI, for renal biopsy HPI: Pt is a 42yo WF with PMH sig for DM, HTN, obesity, Lung CA, who presented with nephrotic syndrome and an elevated Scr.  Her 24 hour Urine Protein was sign for 17grams and she was brought in for renal biopsy.   Past Medical History  Diagnosis Date  . High cholesterol   . Diabetic retinopathy   . Peripheral neuropathy     "tips of toes"  . Blind right eye   . Cardiomyopathy   . CHF (congestive heart failure)   . CAD (coronary artery disease)     NO STENTS  . GERD (gastroesophageal reflux disease)   . Hypertension     under control with med., has been on med. x 1 yr.  . History of lung cancer 07/2011    s/p left lower lobectomy  . Diabetes mellitus     IDDM  . Runny nose 07/10/2012    clear drainage  . Cataract of right eye   . Ulcer of toe of right foot 07/10/2012    great toe  . Breast calcification, left 06/2012  . Nephrotic syndrome   . Nephrotic range proteinuria   . Cancer     Adenocarcinoma of lung  . Acute biphenotypic leukemia     Allergies:  Allergies  Allergen Reactions  . Crestor [Rosuvastatin] Other (See Comments)    Severe muscle weakness  . Ciprofloxacin Rash    Medications: {medication reviewed/display:[id number] Results for orders placed during the hospital encounter of 12/05/12 (from the past 48 hour(s))  GLUCOSE, CAPILLARY     Status: Abnormal   Collection Time    12/05/12 12:05 PM      Result Value Range   Glucose-Capillary 183 (*) 70 - 99 mg/dL   Comment 1 Documented in Chart     Comment 2 Notify RN      No results found.  Blood pressure 97/63, pulse 81, resp. rate 14, last menstrual period 12/05/2012, SpO2 96.00%. General appearance: alert and cooperative Head: Normocephalic, without obvious abnormality, atraumatic Eyes: conjunctivae/corneas clear. PERRL, EOM's intact. Fundi benign. Resp: clear to auscultation bilaterally and normal  percussion bilaterally Cardio: regular rate and rhythm, S1, S2 normal, no murmur, click, rub or gallop GI: soft, non-tender; bowel sounds normal; no masses,  no organomegaly Extremities: edema trace  Assessment/Plan: 1. Nephrotic syndrome- here for renal biopsy 2. HTN- stable 3. DM- cont with outpt meds 4. CAD- stable 5. H/o lung CA s/p lobectomy 6. Breast mass- atypical but no evidence of malignancy 7. Anemia- follow h/h   Suren Payne A 12/05/2012, 2:57 PM

## 2012-12-05 NOTE — ED Notes (Signed)
Dr. Arrie Aran aware of hypotension. No new orders received at this time. Will continue to monitor closely for bleeding.

## 2012-12-05 NOTE — Procedures (Signed)
Patient in prone position.  Kidneys localized with U/S.  Prep clorohexadine, xylocaine LA.  Using U/S guidance 2 passes to obtain 2 cores of tissue of right kidney.  EBL n/a.  Tolerated well, no acute complications.

## 2012-12-05 NOTE — ED Notes (Signed)
Pt not receiving sedation; to be monitored only during the procedure

## 2012-12-05 NOTE — ED Notes (Signed)
Patient denies pain and is resting comfortably.  

## 2012-12-06 LAB — CBC
MCH: 31 pg (ref 26.0–34.0)
MCV: 89.8 fL (ref 78.0–100.0)
Platelets: 313 10*3/uL (ref 150–400)
RBC: 3.93 MIL/uL (ref 3.87–5.11)

## 2012-12-06 LAB — TYPE AND SCREEN: Unit division: 0

## 2012-12-06 LAB — GLUCOSE, CAPILLARY: Glucose-Capillary: 153 mg/dL — ABNORMAL HIGH (ref 70–99)

## 2012-12-06 NOTE — Progress Notes (Signed)
Discharge home. Home discharge instruction given, no questions verbalized. 

## 2012-12-06 NOTE — Discharge Summary (Signed)
Physician Discharge Summary  Patient ID: Hannah [name] MRN: [id number] DOB/AGE: [birth year] 44 y.o.  Admit date: 12/05/2012 Discharge date: 12/06/2012  Admission Diagnoses:Nephrotic syndrome, CKD, DM, HTN, renal biopsy  Discharge Diagnoses: Nephrotic syndrome, CKD, DM, HTN, renal biopsy Active Problems:   * No active hospital problems. *   Discharged Condition: good  Hospital Course: Pt was admitted for renal biopsy which was performed on 12/05/12 without complication.  Her Hgb has remained stable. She did have hematuria but is also on her menses.  Stable for discharge to home and f/u in our office.  Consults: None  Significant Diagnostic Studies: renal biopsy  Treatments: observation  Blood pressure 116/70, pulse 90, temperature 98.5 F (36.9 C), temperature source Oral, resp. rate 16, height 5\' 7"  (1.702 m), weight 117.754 kg (259 lb 9.6 oz), last menstrual period 12/05/2012, SpO2 98.00%.  Disposition: 01-Home or Self Care   Future Appointments Provider Department Dept Phone   02/23/2013 11:30 AM Salley Scarlet, MD [facility] [phone]   02/26/2013 10:00 AM [facility] [phone]   02/26/2013 11:20 AM Carma Lair Nickel, NP [facility] [phone]       Medication List    ASK your doctor about these medications       acetaminophen 500 MG tablet  Commonly known as:  TYLENOL  Take 500 mg by mouth 3 (three) times daily as needed for pain.     atorvastatin 20 MG tablet  Commonly known as:  LIPITOR  Take 1 tablet (20 mg total) by mouth daily.     glucose blood test strip  Test once daily     hydrocortisone cream 1 %  Apply 1 application topically daily as needed (when rash occurs).     hydroxypropyl methylcellulose 2.5 % ophthalmic solution  Commonly known as:  ISOPTO TEARS  Place 1 drop into both eyes at bedtime.     Insulin Detemir 100 UNIT/ML Sopn  Commonly known  as:  LEVEMIR FLEXPEN  Inject 45 units at bedtime     insulin lispro 100 UNIT/ML Sopn  Commonly known as:  HUMALOG KWIKPEN  Inject 10-15 units with each meal     Insulin Pen Needle 31G X 8 MM Misc  Use pen needles for humalog and levemir as directed     metolazone 2.5 MG tablet  Commonly known as:  ZAROXOLYN  Take 2.5 mg by mouth every other day.     metoprolol succinate 25 MG 24 hr tablet  Commonly known as:  TOPROL XL  Take 1 tablet (25 mg total) by mouth daily.     oxyCODONE-acetaminophen 5-325 MG per tablet  Commonly known as:  ROXICET  Take 1 tablet by mouth every 4 (four) hours as needed for pain.     potassium chloride SA 20 MEQ tablet  Commonly known as:  K-DUR,KLOR-CON  Take 1 tablet (20 mEq total) by mouth daily. Take with diuretic     torsemide 20 MG tablet  Commonly known as:  DEMADEX  Take 20 mg by mouth daily.         Signed: Shavonte Zhao A 12/06/2012, 10:51 AM

## 2013-02-05 NOTE — Progress Notes (Signed)
REVIEWED.  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

## 2013-02-20 ENCOUNTER — Other Ambulatory Visit: Payer: Self-pay | Admitting: Family Medicine

## 2013-02-20 MED ORDER — TORSEMIDE 20 MG PO TABS: 20.0000 mg | ORAL_TABLET | Freq: Every day | ORAL | Status: DC

## 2013-02-20 NOTE — Telephone Encounter (Signed)
Rx Refilled  

## 2013-02-22 ENCOUNTER — Other Ambulatory Visit: Payer: Self-pay

## 2013-02-23 ENCOUNTER — Ambulatory Visit: Payer: Medicare Other | Admitting: Family Medicine

## 2013-02-23 ENCOUNTER — Encounter: Payer: Self-pay | Admitting: Family

## 2013-02-26 ENCOUNTER — Ambulatory Visit: Payer: Medicare Other | Admitting: Family

## 2013-02-26 ENCOUNTER — Inpatient Hospital Stay (HOSPITAL_COMMUNITY): Admission: RE | Admit: 2013-02-26 | Payer: Medicare Other | Source: Ambulatory Visit

## 2013-02-27 ENCOUNTER — Ambulatory Visit: Payer: Medicare Other | Admitting: Family Medicine

## 2013-03-21 ENCOUNTER — Encounter: Payer: Self-pay | Admitting: Physician Assistant

## 2013-03-21 ENCOUNTER — Ambulatory Visit (INDEPENDENT_AMBULATORY_CARE_PROVIDER_SITE_OTHER): Payer: Medicare Other | Admitting: Physician Assistant

## 2013-03-21 VITALS — HR 68 | Temp 98.4°F | Resp 20 | Wt 268.0 lb

## 2013-03-21 DIAGNOSIS — B9689 Other specified bacterial agents as the cause of diseases classified elsewhere: Secondary | ICD-10-CM

## 2013-03-21 DIAGNOSIS — A499 Bacterial infection, unspecified: Secondary | ICD-10-CM

## 2013-03-21 DIAGNOSIS — J988 Other specified respiratory disorders: Secondary | ICD-10-CM

## 2013-03-21 MED ORDER — AZITHROMYCIN 250 MG PO TABS: ORAL_TABLET | ORAL | Status: DC

## 2013-03-21 NOTE — Progress Notes (Signed)
Patient ID: Hannah Hannah [name] MRN: [id number], DOB: [birth year], 42 y.o. Date of Encounter: 03/21/2013, 12:03 PM    Chief Complaint:  Chief Complaint  Patient presents with  . hacky wet cough x 1 week     HPI: 42 y.o. year old white Hannah [name] reports that she has had cough for 1 week. No nasal mucus. No ST, ear pain, no fever/chills. Taking Mucinex DM.      Home Meds: See attached medication section for any medications that were entered at today's visit. The computer does not put those onto this list.The following list is a list of meds entered prior to today's visit.   Current Outpatient Prescriptions on File Prior to Visit  Medication Sig Dispense Refill  . acetaminophen (TYLENOL) 500 MG tablet Take 500 mg by mouth 3 (three) times daily as needed for pain.      Marland Kitchen glucose blood test strip Test once daily  100 each  11  . hydrocortisone cream 1 % Apply 1 application topically daily as needed (when rash occurs).      . hydroxypropyl methylcellulose (ISOPTO TEARS) 2.5 % ophthalmic solution Place 1 drop into both eyes at bedtime.      . Insulin Detemir (LEVEMIR FLEXPEN) 100 UNIT/ML SOPN Inject 45 units at bedtime  5 pen  11  . insulin lispro (HUMALOG KWIKPEN) 100 UNIT/ML SOPN Inject 10-15 units with each meal  3 pen  11  . Insulin Pen Needle 31G X 8 MM MISC Use pen needles for humalog and levemir as directed  200 each  11  . metolazone (ZAROXOLYN) 2.5 MG tablet Take 2.5 mg by mouth every other day.       . metoprolol succinate (TOPROL XL) 25 MG 24 hr tablet Take 1 tablet (25 mg total) by mouth daily.  30 tablet  3  . torsemide (DEMADEX) 20 MG tablet Take 1 tablet (20 mg total) by mouth daily.  30 tablet  2  . potassium chloride SA (K-DUR,KLOR-CON) 20 MEQ tablet Take 1 tablet (20 mEq total) by mouth daily. Take with diuretic  30 tablet  6   No current facility-administered medications on file prior to visit.    Allergies:  Allergies  Allergen Reactions  . Crestor [Rosuvastatin] Other  (See Comments)    Severe muscle weakness  . Ciprofloxacin Rash      Review of Systems: See HPI for pertinent ROS. All other ROS negative.    Physical Exam: Pulse Hannah, temperature 98.4 F (36.9 C), temperature source Oral, resp. rate 20, weight 268 lb (121.564 kg)., Body mass index is 41.96 kg/(m^2). General: Obese WF.  Appears in no acute distress. HEENT: Normocephalic, atraumatic, eyes without discharge, sclera non-icteric, nares are without discharge. Bilateral auditory canals clear, TM's are without perforation, pearly grey and translucent with reflective cone of light bilaterally. Oral cavity moist, posterior pharynx without exudate, erythema, peritonsillar abscess, or post nasal drip.  Neck: Supple. No thyromegaly. No lymphadenopathy. Lungs: Clear bilaterally to auscultation without wheezes, rales, or rhonchi. Breathing is unlabored. Heart: Regular rhythm. No murmurs, rubs, or gallops. Msk:  Strength and tone normal for age. Extremities/Skin: Warm and dry. No clubbing or cyanosis. No edema. No rashes or suspicious lesions. Neuro: Alert and oriented X 3. Moves all extremities spontaneously. Gait is normal. CNII-XII grossly in tact. Psych:  Responds to questions appropriately with a normal affect.     ASSESSMENT AND PLAN:  42 y.o. year old Hannah [name] with  1. Bacterial respiratory infection - azithromycin (ZITHROMAX) 250  MG tablet; Day 1; Take 2 daily. Days 2-5; Take 1 daily  Dispense: 6 tablet; Refill: 0 Cont Mucinex DM expectorant. F/U if symptoms do not resolve   Signed, [address] [place], [place], [facility] 03/21/2013 12:03 PM

## 2013-04-03 ENCOUNTER — Ambulatory Visit: Payer: Medicare Other | Admitting: Family Medicine

## 2013-04-13 ENCOUNTER — Encounter: Payer: Self-pay | Admitting: Family Medicine

## 2013-04-13 ENCOUNTER — Other Ambulatory Visit: Payer: Self-pay | Admitting: Family Medicine

## 2013-04-13 NOTE — Telephone Encounter (Signed)
Medication refill for one time only.  Patient needs to be seen.  Letter sent for patient to call and schedule 

## 2013-04-25 ENCOUNTER — Emergency Department (HOSPITAL_COMMUNITY): Payer: Medicare Other

## 2013-04-25 ENCOUNTER — Emergency Department (HOSPITAL_COMMUNITY)
Admission: EM | Admit: 2013-04-25 | Discharge: 2013-04-25 | Disposition: A | Discharge status: Home / Self Care | Payer: Medicare Other | Attending: Emergency Medicine | Admitting: Emergency Medicine

## 2013-04-25 ENCOUNTER — Encounter (HOSPITAL_COMMUNITY): Payer: Self-pay | Admitting: Emergency Medicine

## 2013-04-25 DIAGNOSIS — Z87891 Personal history of nicotine dependence: Secondary | ICD-10-CM | POA: Insufficient documentation

## 2013-04-25 DIAGNOSIS — X500XXA Overexertion from strenuous movement or load, initial encounter: Secondary | ICD-10-CM | POA: Insufficient documentation

## 2013-04-25 DIAGNOSIS — I509 Heart failure, unspecified: Secondary | ICD-10-CM | POA: Insufficient documentation

## 2013-04-25 DIAGNOSIS — Z8669 Personal history of other diseases of the nervous system and sense organs: Secondary | ICD-10-CM | POA: Insufficient documentation

## 2013-04-25 DIAGNOSIS — Y92009 Unspecified place in unspecified non-institutional (private) residence as the place of occurrence of the external cause: Secondary | ICD-10-CM | POA: Insufficient documentation

## 2013-04-25 DIAGNOSIS — R209 Unspecified disturbances of skin sensation: Secondary | ICD-10-CM | POA: Insufficient documentation

## 2013-04-25 DIAGNOSIS — E7800 Pure hypercholesterolemia, unspecified: Secondary | ICD-10-CM | POA: Insufficient documentation

## 2013-04-25 DIAGNOSIS — E11319 Type 2 diabetes mellitus with unspecified diabetic retinopathy without macular edema: Secondary | ICD-10-CM | POA: Insufficient documentation

## 2013-04-25 DIAGNOSIS — Z794 Long term (current) use of insulin: Secondary | ICD-10-CM | POA: Insufficient documentation

## 2013-04-25 DIAGNOSIS — S99919A Unspecified injury of unspecified ankle, initial encounter: Principal | ICD-10-CM

## 2013-04-25 DIAGNOSIS — Z8719 Personal history of other diseases of the digestive system: Secondary | ICD-10-CM | POA: Insufficient documentation

## 2013-04-25 DIAGNOSIS — S99929A Unspecified injury of unspecified foot, initial encounter: Principal | ICD-10-CM

## 2013-04-25 DIAGNOSIS — Z79899 Other long term (current) drug therapy: Secondary | ICD-10-CM | POA: Insufficient documentation

## 2013-04-25 DIAGNOSIS — I10 Essential (primary) hypertension: Secondary | ICD-10-CM | POA: Insufficient documentation

## 2013-04-25 DIAGNOSIS — Z87448 Personal history of other diseases of urinary system: Secondary | ICD-10-CM | POA: Insufficient documentation

## 2013-04-25 DIAGNOSIS — Z856 Personal history of leukemia: Secondary | ICD-10-CM | POA: Insufficient documentation

## 2013-04-25 DIAGNOSIS — E1139 Type 2 diabetes mellitus with other diabetic ophthalmic complication: Secondary | ICD-10-CM | POA: Insufficient documentation

## 2013-04-25 DIAGNOSIS — M79672 Pain in left foot: Secondary | ICD-10-CM

## 2013-04-25 DIAGNOSIS — E1169 Type 2 diabetes mellitus with other specified complication: Secondary | ICD-10-CM | POA: Insufficient documentation

## 2013-04-25 DIAGNOSIS — Z95818 Presence of other cardiac implants and grafts: Secondary | ICD-10-CM | POA: Insufficient documentation

## 2013-04-25 DIAGNOSIS — Z902 Acquired absence of lung [part of]: Secondary | ICD-10-CM | POA: Insufficient documentation

## 2013-04-25 DIAGNOSIS — Z85118 Personal history of other malignant neoplasm of bronchus and lung: Secondary | ICD-10-CM | POA: Insufficient documentation

## 2013-04-25 DIAGNOSIS — I2510 Atherosclerotic heart disease of native coronary artery without angina pectoris: Secondary | ICD-10-CM | POA: Insufficient documentation

## 2013-04-25 DIAGNOSIS — S8990XA Unspecified injury of unspecified lower leg, initial encounter: Secondary | ICD-10-CM | POA: Insufficient documentation

## 2013-04-25 DIAGNOSIS — Z872 Personal history of diseases of the skin and subcutaneous tissue: Secondary | ICD-10-CM | POA: Insufficient documentation

## 2013-04-25 DIAGNOSIS — Y9301 Activity, walking, marching and hiking: Secondary | ICD-10-CM | POA: Insufficient documentation

## 2013-04-25 LAB — GLUCOSE, CAPILLARY: Glucose-Capillary: 134 mg/dL — ABNORMAL HIGH (ref 70–99)

## 2013-04-25 MED ORDER — HYDROCODONE-ACETAMINOPHEN 5-325 MG PO TABS: 1.0000 | ORAL_TABLET | ORAL | Status: DC | PRN

## 2013-04-25 MED ORDER — HYDROCODONE-ACETAMINOPHEN 5-325 MG PO TABS
1.0000 | ORAL_TABLET | Freq: Once | ORAL | Status: AC
  Administered 2013-04-25: 1 via ORAL
  Filled 2013-04-25: qty 1

## 2013-04-25 MED ORDER — IBUPROFEN 800 MG PO TABS
800.0000 mg | ORAL_TABLET | Freq: Once | ORAL | Status: DC
  Filled 2013-04-25: qty 1

## 2013-04-25 NOTE — ED Notes (Signed)
Walking barefoot in home today, when stepped felt and heard a pop on bottom outside of foot.  Did not see any object on floor.  Pain with ambulation.  Just a little pain if not walking, pain increases with weight bearing.

## 2013-04-25 NOTE — ED Notes (Signed)
Pt c/o soreness with weight bearing to bottom of left foot since she felt a pop in her left foot earlier today

## 2013-04-26 NOTE — ED Provider Notes (Signed)
CSN: [id number]     Arrival date & time 04/25/13  1855 History   First MD Initiated Contact with Patient 04/25/13 1944     Chief Complaint  Patient presents with  . Foot Pain   (Consider location/radiation/quality/duration/timing/severity/associated sxs/prior Treatment) HPI Comments: Hannah [name] is a 43 y.o. Female presenting with sudden onset of severe pain along the plantar aspect of her left foot while walking barefoot in her home.  She felt and heard a popping sensation when the pain occurred and has been persistent with weight bearing and almost resolved when resting the foot.  She denies any new numbness or pain in her toes but does have some neuropathy in her toes chronically, related to her diabetes.  She has taken no medicines prior to arrival for her pain.       The history is provided by the patient.    Past Medical History  Diagnosis Date  . High cholesterol   . Diabetic retinopathy   . Peripheral neuropathy     "tips of toes"  . Blind right eye   . Cardiomyopathy   . CHF (congestive heart failure)   . CAD (coronary artery disease)     NO STENTS  . GERD (gastroesophageal reflux disease)   . Hypertension     under control with med., has been on med. x 1 yr.  . History of lung cancer 07/2011    s/p left lower lobectomy  . Diabetes mellitus     IDDM  . Runny nose 07/10/2012    clear drainage  . Cataract of right eye   . Ulcer of toe of right foot 07/10/2012    great toe  . Breast calcification, left 06/2012  . Nephrotic syndrome   . Nephrotic range proteinuria   . Cancer     Adenocarcinoma of lung  . Acute biphenotypic leukemia    Past Surgical History  Procedure Laterality Date  . Cardiac catheterization  07/16/2011  . Incision and drainage breast abscess Left   . Tubal ligation  1994  . Vitrectomy  2010    2 on left, 1 on right  . Cesarean section  1991; 1994  . Video assisted thoracoscopy (vats)/ lobectomy Left 07/30/2011    left main thoracotomy, left  lower lobectomy, mediastinal lymph node dissection  . Lobectomy    . Colonoscopy with esophagogastroduodenoscopy (egd) N/A 08/14/2012    Procedure: COLONOSCOPY WITH ESOPHAGOGASTRODUODENOSCOPY (EGD);  Surgeon: Danie Binder, MD;  [facility];  Service: Endoscopy;  Laterality: N/A;  10:45-moved to [address] to notify pt  . Breast lumpectomy with needle localization Left 11/14/2012    Procedure: BREAST LUMPECTOMY WITH NEEDLE LOCALIZATION;  Surgeon: Marcello Moores A. Cornett, MD;  Location: [place] [facility];  Service: General;  Laterality: Left;   Family History  Problem Relation Age of Onset  . Coronary artery disease Father   . Asthma Father   . COPD Father   . Hypertension Father   . Hyperlipidemia Father   . Diabetes Father   . Congestive Heart Failure Father   . Heart disease Father   . Heart attack Father   . Peripheral vascular disease Father   . Hypertension Mother   . Hyperlipidemia Mother   . Diabetes Mother   . Cancer Mother   . Cancer Maternal Aunt      three aunts, bone, breast, ?  . Hypertension Brother   . Diabetes Brother   . Diabetes Sister   . Colon cancer Neg Hx   .  Celiac disease Neg Hx   . Crohn's disease Neg Hx   . Ulcerative colitis Neg Hx   . Lung cancer Maternal Grandmother    History  Substance Use Topics  . Smoking status: Former Smoker -- 2.00 packs/day for 30 years    Quit date: 08/01/2011  . Smokeless tobacco: Never Used  . Alcohol Use: No   OB History   Grav Para Term Preterm Abortions TAB SAB Ect Mult Living                 Review of Systems  Constitutional: Negative for fever.  Musculoskeletal: Positive for arthralgias. Negative for joint swelling and myalgias.  Neurological: Negative for weakness and numbness.    Allergies  Crestor and Ciprofloxacin  Home Medications   Current Outpatient Rx  Name  Route  Sig  Dispense  Refill  . acetaminophen (TYLENOL) 500 MG tablet   Oral   Take 500 mg by mouth 3 (three) times daily as needed  for pain.         . hydrocortisone cream 1 %   Topical   Apply 1 application topically daily as needed (when rash occurs).         . hydroxypropyl methylcellulose (ISOPTO TEARS) 2.5 % ophthalmic solution   Both Eyes   Place 1 drop into both eyes at bedtime as needed and may repeat dose one time if needed for dry eyes.          . Insulin Detemir (LEVEMIR FLEXPEN) 100 UNIT/ML Pen   Subcutaneous   Inject 45 Units into the skin every evening.         . insulin lispro (HUMALOG KWIKPEN) 100 UNIT/ML KiwkPen   Subcutaneous   Inject 12-15 Units into the skin 3 (three) times daily with meals.         . metolazone (ZAROXOLYN) 2.5 MG tablet   Oral   Take 2.5 mg by mouth every other day.          . metoprolol succinate (TOPROL-XL) 25 MG 24 hr tablet   Oral   Take 25 mg by mouth daily.         Marland Kitchen torsemide (DEMADEX) 20 MG tablet   Oral   Take 1 tablet (20 mg total) by mouth daily.   30 tablet   2   . HYDROcodone-acetaminophen (NORCO/VICODIN) 5-325 MG per tablet   Oral   Take 1 tablet by mouth every 4 (four) hours as needed.   20 tablet   0    BP 104/65  Pulse 93  Temp(Src) 98.1 F (36.7 C) (Oral)  Resp 20  Ht 5\' 7"  (1.702 m)  Wt 279 lb (126.554 kg)  BMI 43.69 kg/m2  SpO2 97%  LMP 04/19/2013 Physical Exam  Constitutional: She appears well-developed and well-nourished.  HENT:  Head: Atraumatic.  Neck: Normal range of motion.  Cardiovascular:  Pulses equal bilaterally  Musculoskeletal: She exhibits tenderness. She exhibits no edema.       Left foot: She exhibits bony tenderness. She exhibits no swelling, normal capillary refill, no crepitus and no deformity.  Point tender at left calcaneous at the plantar insertion site with lesser tenderness along the more distal plantar foot.  No palpable deformity, no edema or ecchymosis. Pedal pulses intact.  Neurological: She is alert. She has normal strength. She displays normal reflexes. No sensory deficit.  Equal  strength  Skin: Skin is warm and dry.  Psychiatric: She has a normal mood and affect.    ED  Course  Procedures (including critical care time) Labs Review Labs Reviewed  GLUCOSE, CAPILLARY - Abnormal; Notable for the following:    Glucose-Capillary 134 (*)    All other components within normal limits   Imaging Review Dg Foot Complete Left  04/25/2013   CLINICAL DATA:  Felt a pop in left foot while walking through house, pain at 5th toe radiating to bottom of foot  EXAM: LEFT FOOT - COMPLETE 3+ VIEW  COMPARISON:  None  FINDINGS: Osseous mineralization normal.  Joint spaces preserved.  No fracture, dislocation, or bone destruction.  IMPRESSION: Normal exam.   Electronically Signed   By: Lavonia Dana M.D.   On: 04/25/2013 20:07    EKG Interpretation   None       MDM   1. Foot pain, left    Patients labs and/or radiological studies were viewed and considered during the medical decision making and disposition process. Pain plantar left foot, worst pain at the plantar fascia insertion.  Possible strain vs partial disruption of the plantar fascia.  Encouraged ice,  Elevation, crutches given to avoid weight bearing.  F/u with ortho for further evaluation.  She was prescribed hydrocodone for pain relief.      Evalee Jefferson, PA-C 04/26/13 [phone]

## 2013-04-27 NOTE — ED Provider Notes (Signed)
Medical screening examination/treatment/procedure(s) were performed by non-physician practitioner and as supervising physician I was immediately available for consultation/collaboration.  EKG Interpretation   None       Rolland Porter, MD, Abram Sander   Janice Norrie, MD 04/27/13 [phone]

## 2013-05-05 ENCOUNTER — Inpatient Hospital Stay (HOSPITAL_COMMUNITY)
Admission: EM | Admit: 2013-05-05 | Discharge: 2013-05-07 | DRG: 683 | Disposition: A | Discharge status: Home / Self Care | Payer: Medicare Other | Attending: Internal Medicine | Admitting: Internal Medicine

## 2013-05-05 ENCOUNTER — Encounter (HOSPITAL_COMMUNITY): Payer: Self-pay | Admitting: Emergency Medicine

## 2013-05-05 DIAGNOSIS — Z794 Long term (current) use of insulin: Secondary | ICD-10-CM

## 2013-05-05 DIAGNOSIS — K5289 Other specified noninfective gastroenteritis and colitis: Secondary | ICD-10-CM

## 2013-05-05 DIAGNOSIS — E119 Type 2 diabetes mellitus without complications: Secondary | ICD-10-CM | POA: Diagnosis present

## 2013-05-05 DIAGNOSIS — E11319 Type 2 diabetes mellitus with unspecified diabetic retinopathy without macular edema: Secondary | ICD-10-CM | POA: Diagnosis present

## 2013-05-05 DIAGNOSIS — I129 Hypertensive chronic kidney disease with stage 1 through stage 4 chronic kidney disease, or unspecified chronic kidney disease: Secondary | ICD-10-CM | POA: Diagnosis present

## 2013-05-05 DIAGNOSIS — N185 Chronic kidney disease, stage 5: Secondary | ICD-10-CM | POA: Diagnosis present

## 2013-05-05 DIAGNOSIS — G609 Hereditary and idiopathic neuropathy, unspecified: Secondary | ICD-10-CM | POA: Diagnosis present

## 2013-05-05 DIAGNOSIS — J111 Influenza due to unidentified influenza virus with other respiratory manifestations: Secondary | ICD-10-CM | POA: Diagnosis present

## 2013-05-05 DIAGNOSIS — N179 Acute kidney failure, unspecified: Principal | ICD-10-CM | POA: Diagnosis present

## 2013-05-05 DIAGNOSIS — E872 Acidosis, unspecified: Secondary | ICD-10-CM | POA: Diagnosis present

## 2013-05-05 DIAGNOSIS — C9480 Other specified leukemias not having achieved remission: Secondary | ICD-10-CM | POA: Diagnosis present

## 2013-05-05 DIAGNOSIS — E876 Hypokalemia: Secondary | ICD-10-CM | POA: Diagnosis present

## 2013-05-05 DIAGNOSIS — N183 Chronic kidney disease, stage 3 unspecified: Secondary | ICD-10-CM

## 2013-05-05 DIAGNOSIS — N289 Disorder of kidney and ureter, unspecified: Secondary | ICD-10-CM

## 2013-05-05 DIAGNOSIS — Z833 Family history of diabetes mellitus: Secondary | ICD-10-CM

## 2013-05-05 DIAGNOSIS — Z801 Family history of malignant neoplasm of trachea, bronchus and lung: Secondary | ICD-10-CM

## 2013-05-05 DIAGNOSIS — N189 Chronic kidney disease, unspecified: Secondary | ICD-10-CM | POA: Diagnosis present

## 2013-05-05 DIAGNOSIS — E8809 Other disorders of plasma-protein metabolism, not elsewhere classified: Secondary | ICD-10-CM | POA: Diagnosis present

## 2013-05-05 DIAGNOSIS — E1139 Type 2 diabetes mellitus with other diabetic ophthalmic complication: Secondary | ICD-10-CM | POA: Diagnosis present

## 2013-05-05 DIAGNOSIS — Z825 Family history of asthma and other chronic lower respiratory diseases: Secondary | ICD-10-CM

## 2013-05-05 DIAGNOSIS — J101 Influenza due to other identified influenza virus with other respiratory manifestations: Secondary | ICD-10-CM | POA: Diagnosis present

## 2013-05-05 DIAGNOSIS — Z8249 Family history of ischemic heart disease and other diseases of the circulatory system: Secondary | ICD-10-CM

## 2013-05-05 DIAGNOSIS — A088 Other specified intestinal infections: Secondary | ICD-10-CM | POA: Diagnosis present

## 2013-05-05 DIAGNOSIS — H5440 Blindness, one eye, unspecified eye: Secondary | ICD-10-CM | POA: Diagnosis present

## 2013-05-05 DIAGNOSIS — E7800 Pure hypercholesterolemia, unspecified: Secondary | ICD-10-CM | POA: Diagnosis present

## 2013-05-05 DIAGNOSIS — I2510 Atherosclerotic heart disease of native coronary artery without angina pectoris: Secondary | ICD-10-CM | POA: Diagnosis present

## 2013-05-05 DIAGNOSIS — N049 Nephrotic syndrome with unspecified morphologic changes: Secondary | ICD-10-CM | POA: Diagnosis present

## 2013-05-05 DIAGNOSIS — I509 Heart failure, unspecified: Secondary | ICD-10-CM | POA: Diagnosis present

## 2013-05-05 DIAGNOSIS — Z85118 Personal history of other malignant neoplasm of bronchus and lung: Secondary | ICD-10-CM

## 2013-05-05 DIAGNOSIS — I10 Essential (primary) hypertension: Secondary | ICD-10-CM | POA: Diagnosis present

## 2013-05-05 DIAGNOSIS — I428 Other cardiomyopathies: Secondary | ICD-10-CM | POA: Diagnosis present

## 2013-05-05 DIAGNOSIS — K219 Gastro-esophageal reflux disease without esophagitis: Secondary | ICD-10-CM | POA: Diagnosis present

## 2013-05-05 DIAGNOSIS — Z6841 Body Mass Index (BMI) 40.0 and over, adult: Secondary | ICD-10-CM

## 2013-05-05 DIAGNOSIS — Z87891 Personal history of nicotine dependence: Secondary | ICD-10-CM

## 2013-05-05 DIAGNOSIS — E669 Obesity, unspecified: Secondary | ICD-10-CM | POA: Diagnosis present

## 2013-05-05 DIAGNOSIS — E860 Dehydration: Secondary | ICD-10-CM | POA: Diagnosis present

## 2013-05-05 DIAGNOSIS — K529 Noninfective gastroenteritis and colitis, unspecified: Secondary | ICD-10-CM | POA: Diagnosis present

## 2013-05-05 LAB — URINALYSIS, ROUTINE W REFLEX MICROSCOPIC
Bilirubin Urine: NEGATIVE
Glucose, UA: 250 mg/dL — AB
Leukocytes, UA: NEGATIVE
NITRITE: NEGATIVE
SPECIFIC GRAVITY, URINE: 1.02 (ref 1.005–1.030)
UROBILINOGEN UA: 0.2 mg/dL (ref 0.0–1.0)
pH: 6.5 (ref 5.0–8.0)

## 2013-05-05 LAB — URINE MICROSCOPIC-ADD ON

## 2013-05-05 LAB — CBC WITH DIFFERENTIAL/PLATELET
BASOS ABS: 0 10*3/uL (ref 0.0–0.1)
BASOS PCT: 0 % (ref 0–1)
EOS ABS: 0 10*3/uL (ref 0.0–0.7)
Eosinophils Relative: 0 % (ref 0–5)
HCT: 36.2 % (ref 36.0–46.0)
HEMOGLOBIN: 12.5 g/dL (ref 12.0–15.0)
Lymphocytes Relative: 10 % — ABNORMAL LOW (ref 12–46)
Lymphs Abs: 0.9 10*3/uL (ref 0.7–4.0)
MCH: 31.8 pg (ref 26.0–34.0)
MCHC: 34.5 g/dL (ref 30.0–36.0)
MCV: 92.1 fL (ref 78.0–100.0)
Monocytes Absolute: 0.5 10*3/uL (ref 0.1–1.0)
Monocytes Relative: 6 % (ref 3–12)
NEUTROS ABS: 7.6 10*3/uL (ref 1.7–7.7)
NEUTROS PCT: 84 % — AB (ref 43–77)
PLATELETS: 259 10*3/uL (ref 150–400)
RBC: 3.93 MIL/uL (ref 3.87–5.11)
RDW: 13.1 % (ref 11.5–15.5)
WBC: 9.1 10*3/uL (ref 4.0–10.5)

## 2013-05-05 LAB — BASIC METABOLIC PANEL
BUN: 50 mg/dL — AB (ref 6–23)
CALCIUM: 7.6 mg/dL — AB (ref 8.4–10.5)
CO2: 22 mEq/L (ref 19–32)
CREATININE: 3.56 mg/dL — AB (ref 0.50–1.10)
Chloride: 94 mEq/L — ABNORMAL LOW (ref 96–112)
GFR, EST AFRICAN AMERICAN: 17 mL/min — AB (ref >90)
GFR, EST NON AFRICAN AMERICAN: 15 mL/min — AB (ref >90)
GLUCOSE: 242 mg/dL — AB (ref 70–99)
POTASSIUM: 3.5 meq/L — AB (ref 3.7–5.3)
Sodium: 132 mEq/L — ABNORMAL LOW (ref 137–147)

## 2013-05-05 LAB — GLUCOSE, CAPILLARY: Glucose-Capillary: 197 mg/dL — ABNORMAL HIGH (ref 70–99)

## 2013-05-05 MED ORDER — ACETAMINOPHEN 650 MG RE SUPP
650.0000 mg | Freq: Four times a day (QID) | RECTAL | Status: DC | PRN
Start: 2013-05-05 — End: 2013-05-07

## 2013-05-05 MED ORDER — SODIUM CHLORIDE 0.9 % IV BOLUS (SEPSIS)
1000.0000 mL | Freq: Once | INTRAVENOUS | Status: AC
  Administered 2013-05-05: 1000 mL via INTRAVENOUS

## 2013-05-05 MED ORDER — HEPARIN SODIUM (PORCINE) 5000 UNIT/ML IJ SOLN
5000.0000 [IU] | Freq: Three times a day (TID) | INTRAMUSCULAR | Status: DC
  Administered 2013-05-05 – 2013-05-07 (×6): 5000 [IU] via SUBCUTANEOUS
  Filled 2013-05-05 (×6): qty 1

## 2013-05-05 MED ORDER — SODIUM CHLORIDE 0.9 % IV SOLN
Freq: Once | INTRAVENOUS | Status: AC
  Administered 2013-05-05: 14:00:00 via INTRAVENOUS

## 2013-05-05 MED ORDER — METOPROLOL SUCCINATE ER 25 MG PO TB24
25.0000 mg | ORAL_TABLET | Freq: Every day | ORAL | Status: DC
  Administered 2013-05-05 – 2013-05-07 (×3): 25 mg via ORAL
  Filled 2013-05-05 (×3): qty 1

## 2013-05-05 MED ORDER — ACETAMINOPHEN 500 MG PO TABS
1000.0000 mg | ORAL_TABLET | Freq: Once | ORAL | Status: AC
  Administered 2013-05-05: 1000 mg via ORAL
  Filled 2013-05-05: qty 2

## 2013-05-05 MED ORDER — SODIUM CHLORIDE 0.9 % IV SOLN
INTRAVENOUS | Status: DC
  Administered 2013-05-05 – 2013-05-07 (×5): via INTRAVENOUS

## 2013-05-05 MED ORDER — VITAMIN D 1000 UNITS PO TABS
1000.0000 [IU] | ORAL_TABLET | Freq: Every day | ORAL | Status: DC
  Administered 2013-05-05 – 2013-05-07 (×3): 1000 [IU] via ORAL
  Filled 2013-05-05 (×3): qty 1

## 2013-05-05 MED ORDER — INSULIN ASPART 100 UNIT/ML ~~LOC~~ SOLN
0.0000 [IU] | Freq: Three times a day (TID) | SUBCUTANEOUS | Status: DC
  Administered 2013-05-06: 8 [IU] via SUBCUTANEOUS
  Administered 2013-05-06: 3 [IU] via SUBCUTANEOUS
  Administered 2013-05-06: 8 [IU] via SUBCUTANEOUS
  Administered 2013-05-07: 3 [IU] via SUBCUTANEOUS

## 2013-05-05 MED ORDER — INSULIN DETEMIR 100 UNIT/ML ~~LOC~~ SOLN
45.0000 [IU] | Freq: Every evening | SUBCUTANEOUS | Status: DC
  Administered 2013-05-06: 45 [IU] via SUBCUTANEOUS
  Filled 2013-05-05 (×3): qty 0.45

## 2013-05-05 MED ORDER — ONDANSETRON HCL 4 MG/2ML IJ SOLN
4.0000 mg | Freq: Once | INTRAMUSCULAR | Status: AC
  Administered 2013-05-05: 4 mg via INTRAVENOUS
  Filled 2013-05-05: qty 2

## 2013-05-05 MED ORDER — POTASSIUM CHLORIDE CRYS ER 20 MEQ PO TBCR
40.0000 meq | EXTENDED_RELEASE_TABLET | Freq: Once | ORAL | Status: AC
  Administered 2013-05-05: 40 meq via ORAL
  Filled 2013-05-05: qty 2

## 2013-05-05 MED ORDER — ONDANSETRON HCL 4 MG PO TABS: 4.0000 mg | ORAL_TABLET | Freq: Four times a day (QID) | ORAL | Status: DC | PRN

## 2013-05-05 MED ORDER — ONDANSETRON HCL 4 MG/2ML IJ SOLN: 4.0000 mg | Freq: Four times a day (QID) | INTRAMUSCULAR | Status: DC | PRN

## 2013-05-05 MED ORDER — INSULIN ASPART 100 UNIT/ML ~~LOC~~ SOLN
0.0000 [IU] | Freq: Every day | SUBCUTANEOUS | Status: DC
  Administered 2013-05-06: 2 [IU] via SUBCUTANEOUS

## 2013-05-05 MED ORDER — DEXTROSE 5 % IV SOLN
1.0000 g | Freq: Once | INTRAVENOUS | Status: AC
  Administered 2013-05-05: 1 g via INTRAVENOUS
  Filled 2013-05-05: qty 10

## 2013-05-05 MED ORDER — SODIUM CHLORIDE 0.9 % IV SOLN
INTRAVENOUS | Status: AC
  Administered 2013-05-05: 17:00:00 via INTRAVENOUS

## 2013-05-05 MED ORDER — ACETAMINOPHEN 325 MG PO TABS
650.0000 mg | ORAL_TABLET | Freq: Four times a day (QID) | ORAL | Status: DC | PRN
  Administered 2013-05-06: 650 mg via ORAL
  Filled 2013-05-05: qty 2

## 2013-05-05 MED ORDER — INSULIN DETEMIR 100 UNIT/ML FLEXPEN: 45.0000 [IU] | PEN_INJECTOR | Freq: Every evening | SUBCUTANEOUS | Status: DC

## 2013-05-05 NOTE — ED Provider Notes (Signed)
CSN: [id number]     Arrival date & time 05/05/13  1019 History  This chart was scribed for Nat Christen, MD by Eston Mould, ED Scribe. This patient was seen in room APA07/AP[facility]07 and the patient's care was started at  11:23 AM  Chief Complaint  Patient presents with  . Fatigue   The history is provided by the patient. No language interpreter was used.   HPI Comments: Hannah [name] is a 43 y.o. female with hx of chronic kidney disease and coronary artery heart disease who presents to the Emergency Department complaining of ongoing weakness, fever, emesis, and diarrhea with generalized body aches and slight CP that began 5 days ago. Pt states she is "unable to hold anything but water" due to several episodes of emesis or diarrhea shortly after. Pt states she "is not feeling well at all" . She states she has lost about 17 pounds since her sx started. Pt states she has been having elevated glucose levels and believes this is due to her current symptoms. She states she is having pain between shoulder blades and is unsure if this is related to her several episodes of emesis. Pt denies receiving dialysis tx.   Pt states her PCP is Dr. Buelah Manis.  Past Medical History  Diagnosis Date  . High cholesterol   . Diabetic retinopathy   . Peripheral neuropathy     "tips of toes"  . Blind right eye   . Cardiomyopathy   . CHF (congestive heart failure)   . CAD (coronary artery disease)     NO STENTS  . GERD (gastroesophageal reflux disease)   . Hypertension     under control with med., has been on med. x 1 yr.  . History of lung cancer 07/2011    s/p left lower lobectomy  . Diabetes mellitus     IDDM  . Runny nose 07/10/2012    clear drainage  . Cataract of right eye   . Ulcer of toe of right foot 07/10/2012    great toe  . Breast calcification, left 06/2012  . Nephrotic syndrome   . Nephrotic range proteinuria   . Cancer     Adenocarcinoma of lung  . Acute biphenotypic leukemia     Past Surgical History  Procedure Laterality Date  . Cardiac catheterization  07/16/2011  . Incision and drainage breast abscess Left   . Tubal ligation  1994  . Vitrectomy  2010    2 on left, 1 on right  . Cesarean section  1991; 1994  . Video assisted thoracoscopy (vats)/ lobectomy Left 07/30/2011    left main thoracotomy, left lower lobectomy, mediastinal lymph node dissection  . Lobectomy    . Colonoscopy with esophagogastroduodenoscopy (egd) N/A 08/14/2012    Procedure: COLONOSCOPY WITH ESOPHAGOGASTRODUODENOSCOPY (EGD);  Surgeon: Danie Binder, MD;  [facility];  Service: Endoscopy;  Laterality: N/A;  10:45-moved to [address] to notify pt  . Breast lumpectomy with needle localization Left 11/14/2012    Procedure: BREAST LUMPECTOMY WITH NEEDLE LOCALIZATION;  Surgeon: Marcello Moores A. Cornett, MD;  Location: [place] [facility];  Service: General;  Laterality: Left;   Family History  Problem Relation Age of Onset  . Coronary artery disease Father   . Asthma Father   . COPD Father   . Hypertension Father   . Hyperlipidemia Father   . Diabetes Father   . Congestive Heart Failure Father   . Heart disease Father   . Heart attack Father   .  Peripheral vascular disease Father   . Hypertension Mother   . Hyperlipidemia Mother   . Diabetes Mother   . Cancer Mother   . Cancer Maternal Aunt      three aunts, bone, breast, ?  . Hypertension Brother   . Diabetes Brother   . Diabetes Sister   . Colon cancer Neg Hx   . Celiac disease Neg Hx   . Crohn's disease Neg Hx   . Ulcerative colitis Neg Hx   . Lung cancer Maternal Grandmother    History  Substance Use Topics  . Smoking status: Former Smoker -- 2.00 packs/day for 30 years    Quit date: 08/01/2011  . Smokeless tobacco: Never Used  . Alcohol Use: No   OB History   Grav Para Term Preterm Abortions TAB SAB Ect Mult Living                 Review of Systems A complete 10 system review of systems was obtained and all systems  are negative except as noted in the HPI and PMH.   Allergies  Crestor and Ciprofloxacin  Home Medications   Current Outpatient Rx  Name  Route  Sig  Dispense  Refill  . acetaminophen (TYLENOL) 500 MG tablet   Oral   Take 500 mg by mouth 3 (three) times daily as needed for pain.         . cholecalciferol (VITAMIN D) 1000 UNITS tablet   Oral   Take 1,000 Units by mouth daily.         . Insulin Detemir (LEVEMIR FLEXPEN) 100 UNIT/ML Pen   Subcutaneous   Inject 45 Units into the skin every evening.         . insulin lispro (HUMALOG KWIKPEN) 100 UNIT/ML KiwkPen   Subcutaneous   Inject 12-15 Units into the skin 3 (three) times daily with meals.         . methylcellulose (ARTIFICIAL TEARS) 1 % ophthalmic solution   Both Eyes   Place 1 drop into both eyes at bedtime as needed (dry eyes).         . metolazone (ZAROXOLYN) 2.5 MG tablet   Oral   Take 2.5 mg by mouth every other day.          . metoprolol succinate (TOPROL-XL) 25 MG 24 hr tablet   Oral   Take 25 mg by mouth daily.         Marland Kitchen torsemide (DEMADEX) 20 MG tablet   Oral   Take 1 tablet (20 mg total) by mouth daily.   30 tablet   2   . hydrocortisone cream 1 %   Topical   Apply 1 application topically daily as needed (when rash occurs).          BP 145/90  Pulse 95  Temp(Src) 99.1 F (37.3 C) (Oral)  Resp 20  Ht 5\' 7"  (1.702 m)  Wt 261 lb (118.389 kg)  BMI 40.87 kg/m2  SpO2 97%  LMP 04/19/2013  Physical Exam  Nursing note and vitals reviewed. Constitutional: She is oriented to person, place, and time. She appears well-developed and well-nourished.  HENT:  Head: Normocephalic and atraumatic.  Eyes: Conjunctivae and EOM are normal. Pupils are equal, round, and reactive to light.  Neck: Normal range of motion. Neck supple.  Cardiovascular: Normal rate, regular rhythm and normal heart sounds.   Pulmonary/Chest: Effort normal and breath sounds normal.  Abdominal: Soft. Bowel sounds are  normal.  Musculoskeletal: Normal range of  motion.  Neurological: She is alert and oriented to person, place, and time.  Skin: Skin is warm and dry.  Psychiatric: She has a normal mood and affect. Her behavior is normal.    ED Course  Procedures DIAGNOSTIC STUDIES: Oxygen Saturation is 97% on RA, normal by my interpretation.    COORDINATION OF CARE: 11:26 AM-Discussed treatment plan which includes give pt IV fluids and basic labs. Pt agreed to plan.   Labs Review Labs Reviewed  BASIC METABOLIC PANEL - Abnormal; Notable for the following:    Sodium 132 (*)    Potassium 3.5 (*)    Chloride 94 (*)    Glucose, Bld 242 (*)    BUN 50 (*)    Creatinine, Ser 3.56 (*)    Calcium 7.6 (*)    GFR calc non Af Amer 15 (*)    GFR calc Af Amer 17 (*)    All other components within normal limits  CBC WITH DIFFERENTIAL - Abnormal; Notable for the following:    Neutrophils Relative % 84 (*)    Lymphocytes Relative 10 (*)    All other components within normal limits  URINALYSIS, ROUTINE W REFLEX MICROSCOPIC - Abnormal; Notable for the following:    APPearance HAZY (*)    Glucose, UA 250 (*)    Hgb urine dipstick MODERATE (*)    Ketones, ur TRACE (*)    Protein, ur >300 (*)    All other components within normal limits  URINE MICROSCOPIC-ADD ON - Abnormal; Notable for the following:    Squamous Epithelial / LPF MANY (*)    Bacteria, UA MANY (*)    Casts GRANULAR CAST (*)    All other components within normal limits  URINE CULTURE   Imaging Review No results found.  EKG Interpretation   None       MDM  No diagnosis found. History and physical consistent with gastroenteritis. Creatinine is elevated along low calcium. Patient has multiple comorbidities. IV hydration.  Admit to general medicine. I personally performed the services described in this documentation, which was scribed in my presence. The recorded information has been reviewed and is accurate.    Nat Christen,  MD 05/05/13 1504

## 2013-05-05 NOTE — H&P (Signed)
[facility] History and Physical  Hannah [name]:[id number] DOB: 1970-09-10 DOA: 05/05/2013  Referring physician: Dr. Lacinda Axon PCP: Vic Blackbird, MD   Chief Complaint: vomiting, diarrhea  HPI: Hannah [name] is a 43 y.o. female history of chronic kidney disease and nephrotic syndrome who presents to the emergency room with complaints of vomiting and diarrhea. Patient reports that her symptoms started approximately 4 days ago with nausea and vomiting. The vomiting lasted approximately 2 days after which diarrhea began. She was having multiple bowel movements, 6-7 bowel movements daily. She did not note any melena or hematochezia. She did have some low-grade fevers as well. She reports that multiple family members have had similar symptoms and had "the flu". She denies any significant chest pain, shortness of breath, dysuria. She reports that her urine output has been normal. She does have a cough that has been present over the last 3 days. She was evaluated in the emergency room and noted to be dehydrated with acute on chronic renal failure. The patient was admitted to the hospital for further treatment   Review of Systems: Pertinent positives as per HPI, otherwise negative  Past Medical History  Diagnosis Date  . High cholesterol   . Diabetic retinopathy   . Peripheral neuropathy     "tips of toes"  . Blind right eye   . Cardiomyopathy   . CHF (congestive heart failure)   . CAD (coronary artery disease)     NO STENTS  . GERD (gastroesophageal reflux disease)   . Hypertension     under control with med., has been on med. x 1 yr.  . History of lung cancer 07/2011    s/p left lower lobectomy  . Diabetes mellitus     IDDM  . Runny nose 07/10/2012    clear drainage  . Cataract of right eye   . Ulcer of toe of right foot 07/10/2012    great toe  . Breast calcification, left 06/2012  . Nephrotic syndrome   . Nephrotic range proteinuria   . Cancer     Adenocarcinoma of lung  .  Acute biphenotypic leukemia    Past Surgical History  Procedure Laterality Date  . Cardiac catheterization  07/16/2011  . Incision and drainage breast abscess Left   . Tubal ligation  1994  . Vitrectomy  2010    2 on left, 1 on right  . Cesarean section  1991; 1994  . Video assisted thoracoscopy (vats)/ lobectomy Left 07/30/2011    left main thoracotomy, left lower lobectomy, mediastinal lymph node dissection  . Lobectomy    . Colonoscopy with esophagogastroduodenoscopy (egd) N/A 08/14/2012    Procedure: COLONOSCOPY WITH ESOPHAGOGASTRODUODENOSCOPY (EGD);  Surgeon: Danie Binder, MD;  Location: [facility];  Service: Endoscopy;  Laterality: N/A;  10:45-moved to [address] to notify pt  . Breast lumpectomy with needle localization Left 11/14/2012    Procedure: BREAST LUMPECTOMY WITH NEEDLE LOCALIZATION;  Surgeon: Marcello Moores A. Cornett, MD;  Location: [place];  Service: General;  Laterality: Left;   Social History:  reports that she quit smoking about 21 months ago. She has never used smokeless tobacco. She reports that she does not drink alcohol or use illicit drugs.  Allergies  Allergen Reactions  . Crestor [Rosuvastatin] Other (See Comments)    Severe muscle weakness  . Nsaids     Not allergic, "bad on my kidneys"  . Ciprofloxacin Rash    Family History  Problem Relation Age of Onset  .  Coronary artery disease Father   . Asthma Father   . COPD Father   . Hypertension Father   . Hyperlipidemia Father   . Diabetes Father   . Congestive Heart Failure Father   . Heart disease Father   . Heart attack Father   . Peripheral vascular disease Father   . Hypertension Mother   . Hyperlipidemia Mother   . Diabetes Mother   . Cancer Mother   . Cancer Maternal Aunt      three aunts, bone, breast, ?  . Hypertension Brother   . Diabetes Brother   . Diabetes Sister   . Colon cancer Neg Hx   . Celiac disease Neg Hx   . Crohn's disease Neg Hx   . Ulcerative colitis Neg Hx   . Lung  cancer Maternal Grandmother      Prior to Admission medications   Medication Sig Start Date End Date Taking? Authorizing Provider  acetaminophen (TYLENOL) 500 MG tablet Take 500 mg by mouth 3 (three) times daily as needed for pain.   Yes Historical Provider, MD  cholecalciferol (VITAMIN D) 1000 UNITS tablet Take 1,000 Units by mouth daily.   Yes Historical Provider, MD  Insulin Detemir (LEVEMIR FLEXPEN) 100 UNIT/ML Pen Inject 45 Units into the skin every evening.   Yes Historical Provider, MD  insulin lispro (HUMALOG KWIKPEN) 100 UNIT/ML KiwkPen Inject 12-15 Units into the skin 3 (three) times daily with meals.   Yes Historical Provider, MD  methylcellulose (ARTIFICIAL TEARS) 1 % ophthalmic solution Place 1 drop into both eyes at bedtime as needed (dry eyes).   Yes Historical Provider, MD  metolazone (ZAROXOLYN) 2.5 MG tablet Take 2.5 mg by mouth every other day.  10/22/12  Yes Historical Provider, MD  metoprolol succinate (TOPROL-XL) 25 MG 24 hr tablet Take 25 mg by mouth daily.   Yes Historical Provider, MD  torsemide (DEMADEX) 20 MG tablet Take 1 tablet (20 mg total) by mouth daily. 02/20/13  Yes Alycia Rossetti, MD  hydrocortisone cream 1 % Apply 1 application topically daily as needed (when rash occurs).    Historical Provider, MD   Physical Exam: Filed Vitals:   05/05/13 1623  BP: 110/72  Pulse: 82  Temp: 98.5 F (36.9 C)  Resp:     BP 110/72  Pulse 82  Temp(Src) 98.5 F (36.9 C) (Oral)  Resp 20  Ht 5\' 7"  (1.702 m)  Wt 120 kg (264 lb 8.8 oz)  BMI 41.42 kg/m2  SpO2 95%  LMP 04/19/2013  General:  Appears calm and comfortable Eyes: PERRL, normal lids, irises & conjunctiva ENT: grossly normal hearing, lips & tongue Neck: no LAD, masses or thyromegaly Cardiovascular: RRR, no m/r/g. No LE edema. Telemetry: SR, no arrhythmias  Respiratory: CTA bilaterally, no w/r/r. Normal respiratory effort. Abdomen: soft, nt, obese, bs+ Skin: no rash or induration seen on limited  exam Musculoskeletal: grossly normal tone BUE/BLE Psychiatric: grossly normal mood and affect, speech fluent and appropriate Neurologic: grossly non-focal.          Labs on Admission:  Basic Metabolic Panel:  Recent Labs Lab 05/05/13 1143  NA 132*  K 3.5*  CL 94*  CO2 22  GLUCOSE 242*  BUN 50*  CREATININE 3.56*  CALCIUM 7.6*   Liver Function Tests: No results found for this basename: AST, ALT, ALKPHOS, BILITOT, PROT, ALBUMIN,  in the last 168 hours No results found for this basename: LIPASE, AMYLASE,  in the last 168 hours No results found for this basename:  AMMONIA,  in the last 168 hours CBC:  Recent Labs Lab 05/05/13 1143  WBC 9.1  NEUTROABS 7.6  HGB 12.5  HCT 36.2  MCV 92.1  PLT 259   Cardiac Enzymes: No results found for this basename: CKTOTAL, CKMB, CKMBINDEX, TROPONINI,  in the last 168 hours  BNP (last 3 results)  Recent Labs  08/09/12 1500 08/10/12 0233  PROBNP 4478.0* 3433.0*   CBG: No results found for this basename: GLUCAP,  in the last 168 hours  Radiological Exams on Admission: No results found.  Assessment/Plan Principal Problem:   Acute on chronic renal failure Active Problems:   Diabetes mellitus   Essential hypertension, benign   CKD (chronic kidney disease), stage III   Gastroenteritis   Dehydration   Hypokalemia   1. Vomiting and diarrhea. Possibly a viral gastroenteritis. Since the patient did receive antibiotics within the past month, there is concern for Clostridium difficile infection. We will check stool for C. difficile. Since the patient's reports that her family recently had the flu, we will also send a influenza swab. She will receive antiemetics. We will followup stool studies results. 2. Dehydration. Patient will receive IV fluids 3. Acute on chronic kidney disease. We'll monitor her improvement on IV fluids. Monitor urine output. Likely related to dehydration. 4. Hypokalemia. Replace 5. Diabetes. Continue Levemir  and start sliding-scale insulin  Code Status: discharge home once improved Family Communication: discussed with patient, no family present Disposition Plan: discharge home once improved  Time spent: 32mins  Seng Fouts [facility] Pager [phone]

## 2013-05-05 NOTE — ED Notes (Signed)
Reports weakness, upper back pain, runny nose, sore throat. Onset Tuesday.

## 2013-05-06 DIAGNOSIS — J101 Influenza due to other identified influenza virus with other respiratory manifestations: Secondary | ICD-10-CM | POA: Diagnosis present

## 2013-05-06 LAB — CBC
HEMATOCRIT: 31 % — AB (ref 36.0–46.0)
Hemoglobin: 10.5 g/dL — ABNORMAL LOW (ref 12.0–15.0)
MCH: 31.3 pg (ref 26.0–34.0)
MCHC: 33.9 g/dL (ref 30.0–36.0)
MCV: 92.5 fL (ref 78.0–100.0)
PLATELETS: 243 10*3/uL (ref 150–400)
RBC: 3.35 MIL/uL — AB (ref 3.87–5.11)
RDW: 13.3 % (ref 11.5–15.5)
WBC: 7.6 10*3/uL (ref 4.0–10.5)

## 2013-05-06 LAB — URINE CULTURE

## 2013-05-06 LAB — INFLUENZA PANEL BY PCR (TYPE A & B)
H1N1 flu by pcr: NOT DETECTED
Influenza A By PCR: POSITIVE — AB
Influenza B By PCR: NEGATIVE

## 2013-05-06 LAB — COMPREHENSIVE METABOLIC PANEL
ALT: 10 U/L (ref 0–35)
AST: 12 U/L (ref 0–37)
Albumin: 1.3 g/dL — ABNORMAL LOW (ref 3.5–5.2)
Alkaline Phosphatase: 55 U/L (ref 39–117)
BUN: 47 mg/dL — AB (ref 6–23)
CO2: 19 mEq/L (ref 19–32)
CREATININE: 3.26 mg/dL — AB (ref 0.50–1.10)
Calcium: 6.7 mg/dL — ABNORMAL LOW (ref 8.4–10.5)
Chloride: 104 mEq/L (ref 96–112)
GFR calc Af Amer: 19 mL/min — ABNORMAL LOW (ref >90)
GFR calc non Af Amer: 16 mL/min — ABNORMAL LOW (ref >90)
Glucose, Bld: 186 mg/dL — ABNORMAL HIGH (ref 70–99)
Potassium: 3.6 mEq/L — ABNORMAL LOW (ref 3.7–5.3)
Sodium: 136 mEq/L — ABNORMAL LOW (ref 137–147)
TOTAL PROTEIN: 4.7 g/dL — AB (ref 6.0–8.3)

## 2013-05-06 LAB — MAGNESIUM: Magnesium: 1.7 mg/dL (ref 1.5–2.5)

## 2013-05-06 LAB — GLUCOSE, CAPILLARY
GLUCOSE-CAPILLARY: 156 mg/dL — AB (ref 70–99)
GLUCOSE-CAPILLARY: 252 mg/dL — AB (ref 70–99)
GLUCOSE-CAPILLARY: 231 mg/dL — AB (ref 70–99)
Glucose-Capillary: 269 mg/dL — ABNORMAL HIGH (ref 70–99)

## 2013-05-06 LAB — CLOSTRIDIUM DIFFICILE BY PCR: Toxigenic C. Difficile by PCR: NEGATIVE

## 2013-05-06 MED ORDER — OSELTAMIVIR PHOSPHATE 75 MG PO CAPS
75.0000 mg | ORAL_CAPSULE | Freq: Two times a day (BID) | ORAL | Status: DC
  Administered 2013-05-06 – 2013-05-07 (×2): 75 mg via ORAL
  Filled 2013-05-06 (×2): qty 1

## 2013-05-06 MED ORDER — POTASSIUM CHLORIDE CRYS ER 20 MEQ PO TBCR
40.0000 meq | EXTENDED_RELEASE_TABLET | Freq: Once | ORAL | Status: AC
  Administered 2013-05-06: 40 meq via ORAL
  Filled 2013-05-06: qty 2

## 2013-05-06 MED ORDER — LOPERAMIDE HCL 2 MG PO CAPS: 2.0000 mg | ORAL_CAPSULE | Freq: Four times a day (QID) | ORAL | Status: DC | PRN

## 2013-05-06 NOTE — Progress Notes (Signed)
[facility] PROGRESS NOTE  Hannah [name] [id number]:[id number] DOB: August 07, 1970 DOA: 05/05/2013 PCP: Vic Blackbird, MD  Assessment/Plan: 1. Acute on chronic renal failure.  Likely related to dehydration from GI illness. Improving with IV fluids.  Baseline creatinine is 2.2.  Continue current treatments. 2. Vomiting, diarrhea.  Stool C diff was negative. No fever or wbc count elevation.  Likely viral gastroenteritis. Will treat supportively. 3. Influenza.  Patient having sinus congestion and fever prior to admission.  Start tamiflu 4. Hypocalcemia. Patient is hypoalbuminemic for her nephrotic syndrome. When calcium is corrected for albumin, it is in normal range at 8.9. 5. Hypokalemia. Replace 6. Diabetes. Stable  Code Status: full code Family Communication: discussed with patient and husband at the bedside Disposition Plan: discharge home, likely tomorrow   Consultants:    Procedures:    Antibiotics:    HPI/Subjective: Feeling better, diarrhea improving, no vomiting, no abdominal pain, has occasional muscle cramps, feels sinus congestion starting  Objective: Filed Vitals:   05/06/13 1707  BP: 110/67  Pulse: 79  Temp: 98 F (36.7 C)  Resp: 20    Intake/Output Summary (Last 24 hours) at 05/06/13 1854 Last data filed at 05/06/13 1800  Gross per 24 hour  Intake   2595 ml  Output      0 ml  Net   2595 ml   Filed Weights   05/05/13 1620 05/05/13 1623 05/06/13 0500  Weight: 120.657 kg (266 lb) 120 kg (264 lb 8.8 oz) 120.6 kg (265 lb 14 oz)    Exam:   General:  NAD  Cardiovascular: S1, S2 RRR  Respiratory: CTA B  Abdomen: soft, nt, nd, bs+  Musculoskeletal: no edema b/l   Data Reviewed: Basic Metabolic Panel:  Recent Labs Lab 05/05/13 1143 05/06/13 0612  NA 132* 136*  K 3.5* 3.6*  CL 94* 104  CO2 22 19  GLUCOSE 242* 186*  BUN 50* 47*  CREATININE 3.56* 3.26*  CALCIUM 7.6* 6.7*  MG  --  1.7   Liver Function Tests:  Recent Labs Lab  05/06/13 0612  AST 12  ALT 10  ALKPHOS 55  BILITOT <0.1*  PROT 4.7*  ALBUMIN 1.3*   No results found for this basename: LIPASE, AMYLASE,  in the last 168 hours No results found for this basename: AMMONIA,  in the last 168 hours CBC:  Recent Labs Lab 05/05/13 1143 05/06/13 0612  WBC 9.1 7.6  NEUTROABS 7.6  --   HGB 12.5 10.5*  HCT 36.2 31.0*  MCV 92.1 92.5  PLT 259 243   Cardiac Enzymes: No results found for this basename: CKTOTAL, CKMB, CKMBINDEX, TROPONINI,  in the last 168 hours BNP (last 3 results)  Recent Labs  08/09/12 1500 08/10/12 0233  PROBNP 4478.0* 3433.0*   CBG:  Recent Labs Lab 05/05/13 2139 05/06/13 0754 05/06/13 1154 05/06/13 1701  GLUCAP 197* 156* 252* 269*    Recent Results (from the past 240 hour(s))  CLOSTRIDIUM DIFFICILE BY PCR     Status: None   Collection Time    05/06/13 11:13 AM      Result Value Range Status   C difficile by pcr NEGATIVE  NEGATIVE Final     Studies: No results found.  Scheduled Meds: . cholecalciferol  1,000 Units Oral Daily  . heparin  5,000 Units Subcutaneous Q8H  . insulin aspart  0-15 Units Subcutaneous TID WC  . insulin aspart  0-5 Units Subcutaneous QHS  . insulin detemir  45 Units Subcutaneous QPM  . metoprolol  succinate  25 mg Oral Daily  . oseltamivir  75 mg Oral BID   Continuous Infusions: . sodium chloride 100 mL/hr at 05/06/13 1215    Principal Problem:   Acute on chronic renal failure Active Problems:   Diabetes mellitus   Essential hypertension, benign   CKD (chronic kidney disease), stage III   Gastroenteritis   Dehydration   Hypokalemia   Influenza A    Time spent: 33mins    Jezabelle Chisolm  [facility] Pager [phone]. If 7PM-7AM, please contact night-coverage at [email or web address], password [facility] 05/06/2013, 6:54 PM  LOS: 1 day

## 2013-05-06 NOTE — Progress Notes (Signed)
  CARE MANAGEMENT ED NOTE 05/06/2013  Patient:  Hannah [name], Hannah [name]   Account Number:  [id number]  Date Initiated:  05/06/2013  Documentation initiated by:  Joan Flores  Subjective/Objective Assessment:     Subjective/Objective Assessment Detail:     28 female presented to ED with complaint of fever, pain, weakness with vomiting and diarrhea.  Pt medical history of Chronic kidney disease and nephrotic syndrome.  Patient admitted: diag acute chronic renal failure.     Action/Plan:   Action/Plan Detail:   Anticipated DC Date:  05/09/2013     Status Recommendation to Physician:   Result of Recommendation:      DC Planning Services  CM consult    Choice offered to / List presented to:  C-1 Patient          Status of service:  Completed, signed off  ED Comments:   ED Comments Detail:

## 2013-05-07 ENCOUNTER — Inpatient Hospital Stay (HOSPITAL_COMMUNITY): Payer: Medicare Other

## 2013-05-07 LAB — BASIC METABOLIC PANEL
BUN: 46 mg/dL — AB (ref 6–23)
CO2: 18 mEq/L — ABNORMAL LOW (ref 19–32)
Calcium: 6.7 mg/dL — ABNORMAL LOW (ref 8.4–10.5)
Chloride: 111 mEq/L (ref 96–112)
Creatinine, Ser: 3.16 mg/dL — ABNORMAL HIGH (ref 0.50–1.10)
GFR calc non Af Amer: 17 mL/min — ABNORMAL LOW (ref >90)
GFR, EST AFRICAN AMERICAN: 19 mL/min — AB (ref >90)
Glucose, Bld: 94 mg/dL (ref 70–99)
POTASSIUM: 3.7 meq/L (ref 3.7–5.3)
SODIUM: 140 meq/L (ref 137–147)

## 2013-05-07 LAB — GLUCOSE, CAPILLARY
Glucose-Capillary: 70 mg/dL (ref 70–99)
Glucose-Capillary: 193 mg/dL — ABNORMAL HIGH (ref 70–99)

## 2013-05-07 MED ORDER — SODIUM BICARBONATE 650 MG PO TABS
650.0000 mg | ORAL_TABLET | Freq: Two times a day (BID) | ORAL | Status: DC
  Administered 2013-05-07: 650 mg via ORAL
  Filled 2013-05-07: qty 1

## 2013-05-07 MED ORDER — FUROSEMIDE 10 MG/ML IJ SOLN: 20.0000 mg | Freq: Once | INTRAMUSCULAR | Status: DC

## 2013-05-07 MED ORDER — OSELTAMIVIR PHOSPHATE 30 MG PO CAPS: 30.0000 mg | ORAL_CAPSULE | Freq: Two times a day (BID) | ORAL | Status: DC

## 2013-05-07 MED ORDER — SODIUM BICARBONATE 650 MG PO TABS: 650.0000 mg | ORAL_TABLET | Freq: Two times a day (BID) | ORAL | Status: DC

## 2013-05-07 MED ORDER — OSELTAMIVIR PHOSPHATE 30 MG PO CAPS
30.0000 mg | ORAL_CAPSULE | Freq: Two times a day (BID) | ORAL | Status: DC
  Filled 2013-05-07 (×4): qty 1

## 2013-05-07 NOTE — Discharge Instructions (Signed)
Viral Gastroenteritis Viral gastroenteritis is also known as stomach flu. This condition affects the stomach and intestinal tract. It can cause sudden diarrhea and vomiting. The illness typically lasts 3 to 8 days. Most people develop an immune response that eventually gets rid of the virus. While this natural response develops, the virus can make you quite ill. CAUSES  Many different viruses can cause gastroenteritis, such as rotavirus or noroviruses. You can catch one of these viruses by consuming contaminated food or water. You may also catch a virus by sharing utensils or other personal items with an infected person or by touching a contaminated surface. SYMPTOMS  The most common symptoms are diarrhea and vomiting. These problems can cause a severe loss of body fluids (dehydration) and a body salt (electrolyte) imbalance. Other symptoms may include:  Fever.  Headache.  Fatigue.  Abdominal pain. DIAGNOSIS  Your caregiver can usually diagnose viral gastroenteritis based on your symptoms and a physical exam. A stool sample may also be taken to test for the presence of viruses or other infections. TREATMENT  This illness typically goes away on its own. Treatments are aimed at rehydration. The most serious cases of viral gastroenteritis involve vomiting so severely that you are not able to keep fluids down. In these cases, fluids must be given through an intravenous line (IV). HOME CARE INSTRUCTIONS   Drink enough fluids to keep your urine clear or pale yellow. Drink small amounts of fluids frequently and increase the amounts as tolerated.  Ask your caregiver for specific rehydration instructions.  Avoid:  Foods high in sugar.  Alcohol.  Carbonated drinks.  Tobacco.  Juice.  Caffeine drinks.  Extremely hot or cold fluids.  Fatty, greasy foods.  Too much intake of anything at one time.  Dairy products until 24 to 48 hours after diarrhea stops.  You may consume probiotics.  Probiotics are active cultures of beneficial bacteria. They may lessen the amount and number of diarrheal stools in adults. Probiotics can be found in yogurt with active cultures and in supplements.  Wash your hands well to avoid spreading the virus.  Only take over-the-counter or prescription medicines for pain, discomfort, or fever as directed by your caregiver. Do not give aspirin to children. Antidiarrheal medicines are not recommended.  Ask your caregiver if you should continue to take your regular prescribed and over-the-counter medicines.  Keep all follow-up appointments as directed by your caregiver. SEEK IMMEDIATE MEDICAL CARE IF:   You are unable to keep fluids down.  You do not urinate at least once every 6 to 8 hours.  You develop shortness of breath.  You notice blood in your stool or vomit. This may look like coffee grounds.  You have abdominal pain that increases or is concentrated in one small area (localized).  You have persistent vomiting or diarrhea.  You have a fever.  The patient is a child younger than 3 months, and he or she has a fever.  The patient is a child older than 3 months, and he or she has a fever and persistent symptoms.  The patient is a child older than 3 months, and he or she has a fever and symptoms suddenly get worse.  The patient is a baby, and he or she has no tears when crying. MAKE SURE YOU:   Understand these instructions.  Will watch your condition.  Will get help right away if you are not doing well or get worse. Document Released: 04/05/2005 Document Revised: 06/28/2011 Document Reviewed: 01/20/2011   [employer] Patient Information 2014 [employer]  

## 2013-05-07 NOTE — Discharge Summary (Signed)
Physician Discharge Summary  Hannah [name] ZOX:[id number] DOB: [birth year] DOA: 05/05/2013  PCP: Vic Blackbird, MD  Admit date: 05/05/2013 Discharge date: 05/07/2013  Time spent: 45 minutes  Recommendations for Outpatient Follow-up:  1. Follow up with nephrology in 1 week 2. Follow up with primary care doctor in 2 weeks\ 3. Repeat bmet in 1 week  Discharge Diagnoses:  Principal Problem:   Acute on chronic renal failure Active Problems:   Diabetes mellitus   Essential hypertension, benign   CKD (chronic kidney disease), stage III   Gastroenteritis   Dehydration   Hypokalemia   Influenza A   Discharge Condition: improved  Diet recommendation: low salt, low carb  Filed Weights   05/05/13 1623 05/06/13 0500 05/07/13 0500  Weight: 120 kg (264 lb 8.8 oz) 120.6 kg (265 lb 14 oz) 121.3 kg (267 lb 6.7 oz)    History of present illness:  Hannah [name] is a 43 y.o. female history of chronic kidney disease and nephrotic syndrome who presents to the emergency room with complaints of vomiting and diarrhea. Patient reports that her symptoms started approximately 4 days ago with nausea and vomiting. The vomiting lasted approximately 2 days after which diarrhea began. She was having multiple bowel movements, 6-7 bowel movements daily. She did not note any melena or hematochezia. She did have some low-grade fevers as well. She reports that multiple family members have had similar symptoms and had "the flu". She denies any significant chest pain, shortness of breath, dysuria. She reports that her urine output has been normal. She does have a cough that has been present over the last 3 days. She was evaluated in the emergency room and noted to be dehydrated with acute on chronic renal failure. The patient was admitted to the hospital for further treatment   Hospital Course:  This patient was admitted to the hospital with persistent nausea, vomiting and dehydration. She was found to have a viral  gastroenteritis. Stool tested negative for C. difficile. She was treated supportively with IV fluids and antiemetics. Her diet was advanced from clear liquids to solid foods and she tolerated this very well. She did begin to have some nasal congestion reported that multiple family members had "the flu". She was tested for influenza A. she did test positive. She was started on Tamiflu. She is afebrile at this time and is feeling back to baseline. The patient is requesting discharge home. It was noted that the patient had an acute on chronic renal failure due to dehydration. The patient was adequately hydrated and is now approaching euvolemia. Her creatinine has trended back down to 3.1. It is unclear if this is her new baseline since previous levels in 8/14 or 2.2 and if she has simply progressed. In any case, patient has been advised to continue hydration at home. She'll be restarted on her diuretic since she does appear to be developing some slight vascular congestion on chest x-ray. She does not describe any shortness of breath. She does have a mild metabolic acidosis which was felt to be related to her chronic kidney disease. She's been started on oral bicarbonate. She's been asked to follow up with her nephrologist in one week for repeat basic metabolic panel.  Procedures:  none  Consultations:  none  Discharge Exam: Filed Vitals:   05/07/13 1534  BP: 141/78  Pulse: 79  Temp: 97.7 F (36.5 C)  Resp: 20    General: NAD Cardiovascular: s1, s2, RRR Respiratory: scattered rhonchi  Discharge Instructions  Discharge Orders   Future Orders Complete By Expires   Call MD for:  difficulty breathing, headache or visual disturbances  As directed    Call MD for:  extreme fatigue  As directed    Call MD for:  persistant dizziness or light-headedness  As directed    Call MD for:  persistant nausea and vomiting  As directed    Diet - low sodium heart healthy  As directed    Increase activity  slowly  As directed        Medication List         acetaminophen 500 MG tablet  Commonly known as:  TYLENOL  Take 500 mg by mouth 3 (three) times daily as needed for pain.     cholecalciferol 1000 UNITS tablet  Commonly known as:  VITAMIN D  Take 1,000 Units by mouth daily.     HUMALOG KWIKPEN 100 UNIT/ML KiwkPen  Generic drug:  insulin lispro  Inject 12-15 Units into the skin 3 (three) times daily with meals.     hydrocortisone cream 1 %  Apply 1 application topically daily as needed (when rash occurs).     LEVEMIR FLEXPEN 100 UNIT/ML Pen  Generic drug:  Insulin Detemir  Inject 45 Units into the skin every evening.     methylcellulose 1 % ophthalmic solution  Commonly known as:  ARTIFICIAL TEARS  Place 1 drop into both eyes at bedtime as needed (dry eyes).     metolazone 2.5 MG tablet  Commonly known as:  ZAROXOLYN  Take 2.5 mg by mouth every other day.     metoprolol succinate 25 MG 24 hr tablet  Commonly known as:  TOPROL-XL  Take 25 mg by mouth daily.     oseltamivir 30 MG capsule  Commonly known as:  TAMIFLU  Take 1 capsule (30 mg total) by mouth 2 (two) times daily.     sodium bicarbonate 650 MG tablet  Take 1 tablet (650 mg total) by mouth 2 (two) times daily.     torsemide 20 MG tablet  Commonly known as:  DEMADEX  Take 1 tablet (20 mg total) by mouth daily.       Allergies  Allergen Reactions  . Crestor [Rosuvastatin] Other (See Comments)    Severe muscle weakness  . Nsaids     Not allergic, "bad on my kidneys"  . Ciprofloxacin Rash       Follow-up Information   Follow up with Vic Blackbird, MD. Schedule an appointment as soon as possible for a visit in 2 weeks.   Specialty:  Family Medicine   Contact information:   [address] [place] [address] [phone]       Follow up with COLADONATO,JOSEPH A, MD. Schedule an appointment as soon as possible for a visit in 1 week.   Specialty:  Nephrology   Contact information:    [place] [address] [phone]        The results of significant diagnostics from this hospitalization (including imaging, microbiology, ancillary and laboratory) are listed below for reference.    Significant Diagnostic Studies: Dg Chest Port 1 View  05/07/2013   CLINICAL DATA:  Short of breath. Left lower lobectomy for lung cancer  EXAM: PORTABLE CHEST - 1 VIEW  COMPARISON:  08/09/2012  FINDINGS: The heart appears enlarged however this may be due to AP projection. There is mild vascular congestion suggesting mild fluid overload. No edema or effusion. Negative for  pneumonia.  IMPRESSION: Question mild fluid overload.   Electronically Signed   By: Franchot Gallo M.D.   On: 05/07/2013 15:45   Dg Foot Complete Left  04/25/2013   CLINICAL DATA:  Felt a pop in left foot while walking through house, pain at 5th toe radiating to bottom of foot  EXAM: LEFT FOOT - COMPLETE 3+ VIEW  COMPARISON:  None  FINDINGS: Osseous mineralization normal.  Joint spaces preserved.  No fracture, dislocation, or bone destruction.  IMPRESSION: Normal exam.   Electronically Signed   By: Lavonia Dana M.D.   On: 04/25/2013 20:07    Microbiology: Recent Results (from the past 240 hour(s))  URINE CULTURE     Status: None   Collection Time    05/05/13 11:41 AM      Result Value Range Status   Specimen Description URINE, CLEAN CATCH   Final   Special Requests NONE   Final   Culture  Setup Time     Final   Value: 05/05/2013 22:12     Performed at [facility]     Final   Value: >=100,000 COLONIES/ML     Performed at [facility]   Culture     Final   Value: GROUP B STREP(S.AGALACTIAE)ISOLATED     Note: TESTING AGAINST S. AGALACTIAE NOT ROUTINELY PERFORMED DUE TO PREDICTABILITY OF AMP/PEN/VAN SUSCEPTIBILITY.     Performed at [facility]   Report Status 05/06/2013 FINAL   Final  CLOSTRIDIUM DIFFICILE BY PCR     Status: None    Collection Time    05/06/13 11:13 AM      Result Value Range Status   C difficile by pcr NEGATIVE  NEGATIVE Final     Labs: Basic Metabolic Panel:  Recent Labs Lab 05/05/13 1143 05/06/13 0612 05/07/13 0458  NA 132* 136* 140  K 3.5* 3.6* 3.7  CL 94* 104 111  CO2 22 19 18*  GLUCOSE 242* 186* 94  BUN 50* 47* 46*  CREATININE 3.56* 3.26* 3.16*  CALCIUM 7.6* 6.7* 6.7*  MG  --  1.7  --    Liver Function Tests:  Recent Labs Lab 05/06/13 0612  AST 12  ALT 10  ALKPHOS 55  BILITOT <0.1*  PROT 4.7*  ALBUMIN 1.3*   No results found for this basename: LIPASE, AMYLASE,  in the last 168 hours No results found for this basename: AMMONIA,  in the last 168 hours CBC:  Recent Labs Lab 05/05/13 1143 05/06/13 0612  WBC 9.1 7.6  NEUTROABS 7.6  --   HGB 12.5 10.5*  HCT 36.2 31.0*  MCV 92.1 92.5  PLT 259 243   Cardiac Enzymes: No results found for this basename: CKTOTAL, CKMB, CKMBINDEX, TROPONINI,  in the last 168 hours BNP: BNP (last 3 results)  Recent Labs  08/09/12 1500 08/10/12 0233  PROBNP 4478.0* 3433.0*   CBG:  Recent Labs Lab 05/06/13 0754 05/06/13 1154 05/06/13 1701 05/06/13 2204 05/07/13 0749  GLUCAP 156* 252* 269* 231* 70       Signed:  Lewis Keats  [facility] 05/07/2013, 3:59 PM

## 2013-05-07 NOTE — Progress Notes (Signed)
Patient with acute on chronic renal failure that is improving.  Estimated CrCl ~30 ml/min.  +Influenza on Tamiflu.   Decrease dose to Tamiflu 30mg  po bid to complete 5 day course per manufacturer recommendations for CrCl 30-73ml/min. Netta Cedars, PharmD, BCPS 05/07/2013@12 :07 PM

## 2013-05-07 NOTE — Progress Notes (Signed)
Pt is to be discharged home today. Pt is in NAD, IV is out, all paperwork has been reviewed/discussed with patient, and there are no questions/concerns at this time. Assessment is unchanged from this morning. Pt is to be accompanied downstairs by staff and family via wheelchair.  

## 2013-05-07 NOTE — Care Management Note (Signed)
    Page 1 of 1   05/07/2013     2:33:19 PM   CARE MANAGEMENT NOTE 05/07/2013  Patient:  Hannah [name], Hannah [name]   Account Number:  [id number]  Date Initiated:  05/07/2013  Documentation initiated by:  Theophilus Kinds  Subjective/Objective Assessment:   Pt admitted from home with gastroenteritis and flu. Pt lives with her husband and will return home at discharge. Pt is independent with ADL's.     Action/Plan:   No CM needs noted.   Anticipated DC Date:  05/08/2013   Anticipated DC Plan:  [place]  CM consult      Choice offered to / List presented to:             Status of service:  Completed, signed off Medicare Important Message given?   (If response is "NO", the following Medicare IM given date fields will be blank) Date Medicare IM given:   Date Additional Medicare IM given:    Discharge Disposition:  HOME/SELF CARE  Per UR Regulation:    If discussed at Long Length of Stay Meetings, dates discussed:    Comments:  05/07/13 [place], RN BSN CM

## 2013-05-16 ENCOUNTER — Other Ambulatory Visit: Payer: Self-pay | Admitting: Family Medicine

## 2013-05-18 ENCOUNTER — Encounter: Payer: Self-pay | Admitting: Family Medicine

## 2013-05-18 NOTE — Telephone Encounter (Signed)
Medication refill for one time only.  Patient needs to be seen.  Letter sent for patient to call and schedule 

## 2013-06-05 ENCOUNTER — Ambulatory Visit: Payer: Medicare Other | Admitting: Family Medicine

## 2013-06-13 ENCOUNTER — Other Ambulatory Visit: Payer: Self-pay | Admitting: Family Medicine

## 2013-07-05 ENCOUNTER — Other Ambulatory Visit: Payer: Self-pay

## 2013-07-05 DIAGNOSIS — C3490 Malignant neoplasm of unspecified part of unspecified bronchus or lung: Secondary | ICD-10-CM

## 2013-07-06 ENCOUNTER — Encounter: Payer: Self-pay | Admitting: Family Medicine

## 2013-07-06 ENCOUNTER — Other Ambulatory Visit: Payer: Self-pay | Admitting: Family Medicine

## 2013-07-06 ENCOUNTER — Ambulatory Visit (INDEPENDENT_AMBULATORY_CARE_PROVIDER_SITE_OTHER): Payer: Medicare Other | Admitting: Family Medicine

## 2013-07-06 VITALS — BP 148/76 | HR 88 | Temp 97.9°F | Resp 18 | Ht 65.0 in | Wt 273.0 lb

## 2013-07-06 DIAGNOSIS — E1149 Type 2 diabetes mellitus with other diabetic neurological complication: Secondary | ICD-10-CM

## 2013-07-06 DIAGNOSIS — I509 Heart failure, unspecified: Secondary | ICD-10-CM

## 2013-07-06 DIAGNOSIS — N183 Chronic kidney disease, stage 3 unspecified: Secondary | ICD-10-CM

## 2013-07-06 DIAGNOSIS — L97509 Non-pressure chronic ulcer of other part of unspecified foot with unspecified severity: Secondary | ICD-10-CM

## 2013-07-06 DIAGNOSIS — R252 Cramp and spasm: Secondary | ICD-10-CM

## 2013-07-06 DIAGNOSIS — R21 Rash and other nonspecific skin eruption: Secondary | ICD-10-CM

## 2013-07-06 DIAGNOSIS — I10 Essential (primary) hypertension: Secondary | ICD-10-CM

## 2013-07-06 DIAGNOSIS — I5031 Acute diastolic (congestive) heart failure: Secondary | ICD-10-CM

## 2013-07-06 DIAGNOSIS — N049 Nephrotic syndrome with unspecified morphologic changes: Secondary | ICD-10-CM

## 2013-07-06 LAB — HEMOGLOBIN A1C, FINGERSTICK: Hgb A1C (fingerstick): 9.6 % — ABNORMAL HIGH (ref <5.7)

## 2013-07-06 MED ORDER — METHYLPREDNISOLONE ACETATE 40 MG/ML IJ SUSP
40.0000 mg | Freq: Once | INTRAMUSCULAR | Status: AC
  Administered 2013-07-06: 40 mg via INTRAMUSCULAR

## 2013-07-06 MED ORDER — HYDROXYZINE HCL 25 MG PO TABS: 25.0000 mg | ORAL_TABLET | Freq: Three times a day (TID) | ORAL | Status: DC | PRN

## 2013-07-06 MED ORDER — CYCLOBENZAPRINE HCL 10 MG PO TABS: 10.0000 mg | ORAL_TABLET | Freq: Three times a day (TID) | ORAL | Status: DC | PRN

## 2013-07-06 MED ORDER — PREDNISONE 10 MG PO TABS: ORAL_TABLET | ORAL | Status: DC

## 2013-07-06 NOTE — Patient Instructions (Addendum)
Start prednisone tomorrow Atarax for itching and rash  Increase levemir 50 units  Continue Humalog  Muscle relaxer  Referral to podiatry ( before noon) Referral to endocrine ( before Noon) [facility] ( appt time needed )  F/U 3 months

## 2013-07-07 LAB — CBC WITH DIFFERENTIAL/PLATELET
BASOS ABS: 0.1 10*3/uL (ref 0.0–0.1)
BASOS PCT: 1 % (ref 0–1)
Eosinophils Absolute: 0.3 10*3/uL (ref 0.0–0.7)
Eosinophils Relative: 4 % (ref 0–5)
HEMATOCRIT: 33.5 % — AB (ref 36.0–46.0)
Hemoglobin: 11 g/dL — ABNORMAL LOW (ref 12.0–15.0)
Lymphocytes Relative: 22 % (ref 12–46)
Lymphs Abs: 1.8 10*3/uL (ref 0.7–4.0)
MCH: 29.6 pg (ref 26.0–34.0)
MCHC: 32.8 g/dL (ref 30.0–36.0)
MCV: 90.1 fL (ref 78.0–100.0)
MONO ABS: 0.4 10*3/uL (ref 0.1–1.0)
Monocytes Relative: 5 % (ref 3–12)
NEUTROS ABS: 5.4 10*3/uL (ref 1.7–7.7)
Neutrophils Relative %: 68 % (ref 43–77)
Platelets: 419 10*3/uL — ABNORMAL HIGH (ref 150–400)
RBC: 3.72 MIL/uL — ABNORMAL LOW (ref 3.87–5.11)
RDW: 13.6 % (ref 11.5–15.5)
WBC: 8 10*3/uL (ref 4.0–10.5)

## 2013-07-07 LAB — COMPREHENSIVE METABOLIC PANEL
ALK PHOS: 77 U/L (ref 39–117)
ALT: 17 U/L (ref 0–35)
AST: 13 U/L (ref 0–37)
Albumin: 2.2 g/dL — ABNORMAL LOW (ref 3.5–5.2)
BUN: 64 mg/dL — AB (ref 6–23)
CO2: 27 mEq/L (ref 19–32)
CREATININE: 4.17 mg/dL — AB (ref 0.50–1.10)
Calcium: 7.8 mg/dL — ABNORMAL LOW (ref 8.4–10.5)
Chloride: 95 mEq/L — ABNORMAL LOW (ref 96–112)
Glucose, Bld: 369 mg/dL — ABNORMAL HIGH (ref 70–99)
POTASSIUM: 3.3 meq/L — AB (ref 3.5–5.3)
Sodium: 133 mEq/L — ABNORMAL LOW (ref 135–145)
Total Bilirubin: 0.1 mg/dL — ABNORMAL LOW (ref 0.2–1.2)
Total Protein: 4.6 g/dL — ABNORMAL LOW (ref 6.0–8.3)

## 2013-07-08 DIAGNOSIS — R21 Rash and other nonspecific skin eruption: Secondary | ICD-10-CM | POA: Insufficient documentation

## 2013-07-08 MED ORDER — METOPROLOL SUCCINATE ER 25 MG PO TB24: 25.0000 mg | ORAL_TABLET | Freq: Every day | ORAL | Status: DC

## 2013-07-08 MED ORDER — POTASSIUM CHLORIDE CRYS ER 20 MEQ PO TBCR: 20.0000 meq | EXTENDED_RELEASE_TABLET | Freq: Every day | ORAL | Status: DC

## 2013-07-08 NOTE — Assessment & Plan Note (Signed)
Currently with callus, refer back to podiatry for care

## 2013-07-08 NOTE — Progress Notes (Signed)
Patient ID: Hannah [name], female   DOB: 1970-09-08, 42 y.o.   MRN: [id number]   Subjective:    Patient ID: Hannah [name], female    DOB: 06/19/1970, 43 y.o.   MRN: [id number]  Patient presents for Check-up  patient here to followup chronic medical problems. She's not been seen since August 2014 by myself, she did see my PA for a URI in December. She is here today with her husband. She's not had a followup appointment with her nephrologist she has EKG stage history. She also has known diabetes with neurological manifestations as well as renal and diabetic retinopathy. She is due for an A1c checked. She has been taking Levemir 45 units at bedtime and Humalog 15 units with each meal. She did not establish with endocrinology since her last visit.She states CBG have been high, no meter with her today.  She's concern today is she has recurrence of her ulcers on her right foot chief a referral back to podiatry. She also has a rash she's had for the past few weeks. She states it started as a small red spot on her head that has now progressed to a large area as well as spots on her legs and arms he has had the lesions are her legs before and arms and was told it was a dermatitis. She has been using hydrocortisone cream as well as Benadryl with minimal improvement in the itching.    Review Of Systems:  GEN- denies fatigue, fever, weight loss,weakness, recent illness HEENT- denies eye drainage, change in vision, nasal discharge, CVS- denies chest pain, palpitations RESP- denies SOB, cough, wheeze ABD- denies N/V, change in stools, abd pain GU- denies dysuria, hematuria, dribbling, incontinence MSK- denies joint pain, muscle aches, injury Neuro- denies headache, dizziness, syncope, seizure activity Skin-+rash      Objective:    BP 148/76  Pulse 88  Temp(Src) 97.9 F (36.6 C)  Resp 18  Ht 5\' 5"  (1.651 m)  Wt 273 lb (123.832 kg)  BMI 45.43 kg/m2  LMP 07/01/2013 GEN- NAD, alert and oriented  x3 HEENT- PERRL, EOMI, non injected sclera, pink conjunctiva, MMM, oropharynx clear Neck- Supple, no thyromegaly CVS- RRR, no murmur RESP-CTAB ABD-NABS,soft,NT,ND, large pannus Skin- large erythematous slightly raised plaque from left lower abdomen to mid thigh wit hsome scaling, dermatitis of bilateral shins, erythematous lesions on bilat arms, +exocoriations Pre-ulcerative Callus right great toe, and 1cm beneath EXT-pedal edema Pulses- Radial, DP- 2+        Assessment & Plan:      Problem List Items Addressed This Visit   Type II or unspecified type diabetes mellitus with neurological manifestations, not stated as uncontrolled - Primary   Relevant Orders      CBC with Differential (Completed)      Comprehensive metabolic panel (Completed)      Hemoglobin A1C, fingerstick (Completed)   Nephrotic syndrome    Other Visit Diagnoses   Nephrotic syndrome with unspecified pathological lesion in kidney        Relevant Medications       methylPREDNISolone acetate (DEPO-MEDROL) injection 40 mg (Completed)       Note: This dictation was prepared with Dragon dictation along with smaller phrase technology. Any transcriptional errors that result from this process are unintentional.

## 2013-07-08 NOTE — Assessment & Plan Note (Addendum)
A1c today is at 9.6%. She will need prednisone because of the extensive rash I will have her increase her Levemir 50 units our reestablish her with endocrinology as she is very complex in with worsening renal function I will verify if she has had pneumovax

## 2013-07-08 NOTE — Assessment & Plan Note (Signed)
Flexeril given, check K , chronic problem for patient

## 2013-07-08 NOTE — Assessment & Plan Note (Signed)
Lesions on leg look more like  A dermatitis, no classic of cellulitis, treat with atarax and prednisone, given prednisone in office, if not improved may need derm

## 2013-07-08 NOTE — Assessment & Plan Note (Signed)
Recheck renal function, refer back to neprhology at [facility]

## 2013-07-08 NOTE — Assessment & Plan Note (Signed)
Current compensated

## 2013-07-08 NOTE — Assessment & Plan Note (Signed)
Refill BP meds, no ACE due to renal function, continue diuretic

## 2013-07-09 ENCOUNTER — Other Ambulatory Visit: Payer: Self-pay | Admitting: *Deleted

## 2013-07-09 ENCOUNTER — Encounter: Payer: Self-pay | Admitting: *Deleted

## 2013-07-09 DIAGNOSIS — N189 Chronic kidney disease, unspecified: Secondary | ICD-10-CM

## 2013-07-09 MED ORDER — GLUCOSE BLOOD VI STRP: ORAL_STRIP | Status: DC

## 2013-07-09 NOTE — Telephone Encounter (Signed)
Refill appropriate and filled per protocol. 

## 2013-07-09 NOTE — Telephone Encounter (Signed)
Medication refilled per protocol. 

## 2013-07-11 ENCOUNTER — Ambulatory Visit: Payer: Medicare Other | Admitting: Family Medicine

## 2013-07-12 ENCOUNTER — Ambulatory Visit: Payer: Medicare Other | Admitting: Endocrinology

## 2013-08-01 ENCOUNTER — Ambulatory Visit: Payer: Medicare Other | Admitting: Endocrinology

## 2013-08-04 ENCOUNTER — Emergency Department (HOSPITAL_COMMUNITY)
Admission: EM | Admit: 2013-08-04 | Discharge: 2013-08-04 | Disposition: A | Discharge status: Home / Self Care | Payer: Medicare Other | Attending: Emergency Medicine | Admitting: Emergency Medicine

## 2013-08-04 ENCOUNTER — Encounter (HOSPITAL_COMMUNITY): Payer: Self-pay | Admitting: Emergency Medicine

## 2013-08-04 ENCOUNTER — Emergency Department (HOSPITAL_COMMUNITY): Payer: Medicare Other

## 2013-08-04 DIAGNOSIS — N289 Disorder of kidney and ureter, unspecified: Secondary | ICD-10-CM

## 2013-08-04 DIAGNOSIS — Z856 Personal history of leukemia: Secondary | ICD-10-CM | POA: Insufficient documentation

## 2013-08-04 DIAGNOSIS — H5440 Blindness, one eye, unspecified eye: Secondary | ICD-10-CM | POA: Insufficient documentation

## 2013-08-04 DIAGNOSIS — IMO0002 Reserved for concepts with insufficient information to code with codable children: Secondary | ICD-10-CM | POA: Insufficient documentation

## 2013-08-04 DIAGNOSIS — Z85118 Personal history of other malignant neoplasm of bronchus and lung: Secondary | ICD-10-CM | POA: Insufficient documentation

## 2013-08-04 DIAGNOSIS — Z9851 Tubal ligation status: Secondary | ICD-10-CM | POA: Insufficient documentation

## 2013-08-04 DIAGNOSIS — I428 Other cardiomyopathies: Secondary | ICD-10-CM | POA: Insufficient documentation

## 2013-08-04 DIAGNOSIS — Z862 Personal history of diseases of the blood and blood-forming organs and certain disorders involving the immune mechanism: Secondary | ICD-10-CM | POA: Insufficient documentation

## 2013-08-04 DIAGNOSIS — I2510 Atherosclerotic heart disease of native coronary artery without angina pectoris: Secondary | ICD-10-CM | POA: Insufficient documentation

## 2013-08-04 DIAGNOSIS — Z79899 Other long term (current) drug therapy: Secondary | ICD-10-CM | POA: Insufficient documentation

## 2013-08-04 DIAGNOSIS — Z794 Long term (current) use of insulin: Secondary | ICD-10-CM | POA: Insufficient documentation

## 2013-08-04 DIAGNOSIS — R21 Rash and other nonspecific skin eruption: Secondary | ICD-10-CM | POA: Insufficient documentation

## 2013-08-04 DIAGNOSIS — I10 Essential (primary) hypertension: Secondary | ICD-10-CM | POA: Insufficient documentation

## 2013-08-04 DIAGNOSIS — Z8719 Personal history of other diseases of the digestive system: Secondary | ICD-10-CM | POA: Insufficient documentation

## 2013-08-04 DIAGNOSIS — Z87891 Personal history of nicotine dependence: Secondary | ICD-10-CM | POA: Insufficient documentation

## 2013-08-04 DIAGNOSIS — I509 Heart failure, unspecified: Secondary | ICD-10-CM | POA: Insufficient documentation

## 2013-08-04 DIAGNOSIS — R609 Edema, unspecified: Secondary | ICD-10-CM | POA: Insufficient documentation

## 2013-08-04 DIAGNOSIS — Z9889 Other specified postprocedural states: Secondary | ICD-10-CM | POA: Insufficient documentation

## 2013-08-04 DIAGNOSIS — N049 Nephrotic syndrome with unspecified morphologic changes: Secondary | ICD-10-CM | POA: Insufficient documentation

## 2013-08-04 DIAGNOSIS — E119 Type 2 diabetes mellitus without complications: Secondary | ICD-10-CM | POA: Insufficient documentation

## 2013-08-04 LAB — BASIC METABOLIC PANEL
BUN: 60 mg/dL — ABNORMAL HIGH (ref 6–23)
CALCIUM: 7.5 mg/dL — AB (ref 8.4–10.5)
CO2: 24 meq/L (ref 19–32)
CREATININE: 4.08 mg/dL — AB (ref 0.50–1.10)
Chloride: 105 mEq/L (ref 96–112)
GFR calc Af Amer: 14 mL/min — ABNORMAL LOW (ref >90)
GFR calc non Af Amer: 12 mL/min — ABNORMAL LOW (ref >90)
Glucose, Bld: 195 mg/dL — ABNORMAL HIGH (ref 70–99)
Potassium: 3.9 mEq/L (ref 3.7–5.3)
Sodium: 141 mEq/L (ref 137–147)

## 2013-08-04 LAB — CBC WITH DIFFERENTIAL/PLATELET
Basophils Absolute: 0.1 10*3/uL (ref 0.0–0.1)
Basophils Relative: 1 % (ref 0–1)
EOS PCT: 2 % (ref 0–5)
Eosinophils Absolute: 0.2 10*3/uL (ref 0.0–0.7)
HEMATOCRIT: 32 % — AB (ref 36.0–46.0)
Hemoglobin: 10.5 g/dL — ABNORMAL LOW (ref 12.0–15.0)
LYMPHS ABS: 1.4 10*3/uL (ref 0.7–4.0)
Lymphocytes Relative: 19 % (ref 12–46)
MCH: 30.8 pg (ref 26.0–34.0)
MCHC: 32.8 g/dL (ref 30.0–36.0)
MCV: 93.8 fL (ref 78.0–100.0)
MONO ABS: 0.4 10*3/uL (ref 0.1–1.0)
MONOS PCT: 5 % (ref 3–12)
Neutro Abs: 5.3 10*3/uL (ref 1.7–7.7)
Neutrophils Relative %: 73 % (ref 43–77)
Platelets: 430 10*3/uL — ABNORMAL HIGH (ref 150–400)
RBC: 3.41 MIL/uL — AB (ref 3.87–5.11)
RDW: 13.4 % (ref 11.5–15.5)
WBC: 7.3 10*3/uL (ref 4.0–10.5)

## 2013-08-04 LAB — TROPONIN I: Troponin I: <0.3 ng/mL (ref <0.30)

## 2013-08-04 LAB — CBG MONITORING, ED: Glucose-Capillary: 159 mg/dL — ABNORMAL HIGH (ref 70–99)

## 2013-08-04 NOTE — ED Notes (Signed)
Patient c/o shortness of breath x1 week. Denies any COPD hx. Per patient no pain in chest but does report pain between shoulders. Patient reports hx of CHF. Per patient cough this morning with scant amount of blood noted.

## 2013-08-04 NOTE — ED Notes (Signed)
Patient c/o shortness of breath for about a week. Pain between shoulder blades with deep breathing. Expiratory wheezes noted. Reports being stationary more this week due to increasing shortness of breath. Diabetic.

## 2013-08-04 NOTE — Discharge Instructions (Signed)
Restart your Metolazone, every other day, as previously. Call your nephrologist on Monday to obtain a followup appointment.     Pulmonary Edema Pulmonary edema is abnormal fluid buildup in the lungs that can make it hard to breath. HOME CARE  Talk to your doctor about an exercise program.  Maintain a healthy diet:  Eat fresh fruits, vegetables, and lean meats.  Limit high fat and salty foods.  Avoid processed, canned, or fried foods.  Avoid fast food.  Follow your doctor's advice about taking medicine and recording the medicine you take.  Follow your doctor's advice about keeping a record of your weight.  Talk to your doctor about keeping track of your blood pressure.  Do not smoke.  Do not use nicotine patches or nicotine gum.  Make a follow-up appointment with your doctor.  Ask your health doctor for a copy of your latest heart tracing (ECG) and keep a copy with you at all times. GET HELP RIGHT AWAY IF:   You have chest painTHIS IS AN EMERGENCY. Do not wait to see if the pain will go away. Call for local emergency medical help. Do not drive yourself to the hospital.  You have sweating, feel sick to your stomach (nauseous), or are experiencing shortness of breath.  Your weight increases more than your doctor tells you it should.  You start to have shortness of breath.  You notice more swelling in your hands, feet, ankles or belly.  You have dizziness, blurred vision, headache, or unsteadiness that does not go away.  You cough up bloody spit.  You have a cough that does not go away.  You are unable to sleep because it is hard to breathe.  You begin to feel a "jumping" or "fluttering" sensation (palpitations) in the chest that is unusual for you. MAKE SURE YOU:   Understand these instructions.  Will watch your condition.  Will get help right away if you are not doing well or get worse. Document Released: 03/24/2009 Document Revised: 01/24/2013 Document  Reviewed: 12/11/2012 [facility] Patient Information 2014 [place].  Kidney Disease, Adult The kidneys are two organs that lie on either side of the spine between the middle of the back and the front of the abdomen. The kidneys:   Remove wastes and extra water from the blood.   Produce important hormones. These regulate blood pressure, help keep bones strong, and help create red blood cells.   Balance the fluids and chemicals in the blood and tissues. Kidney disease occurs when the kidneys are damaged. Kidney damage may be sudden (acute) or develop over a long period (chronic). A small amount of damage may not cause problems, but a large amount of damage may make it difficult or impossible for the kidneys to work the way they should. Early detection and treatment of kidney disease may prevent kidney damage from becoming permanent or getting worse. Some kidney diseases are curable, but most are not. Many people with kidney disease are able to control the disease and live a normal life.  TYPES OF KIDNEY DISEASE  Acute kidney injury.Acute kidney injury occurs when there is sudden damage to the kidneys.  Chronic kidney disease. Chronic kidney disease occurs when the kidneys are damaged over a long period.  End-stage kidney disease. End-stage kidney disease occurs when the kidneys are so damaged that they stop working. In end-stage kidney disease, the kidneys cannot get better. CAUSES Any condition, disease, or event that damages the kidneys may cause kidney disease. Acute kidney  injury.  A problem with blood flow to the kidneys. This may be caused by:   Blood loss.   Heart disease.   Severe burns.   Liver disease.  Direct damage to the kidneys. This may be caused by:  Some medicines.   A kidney infection.   Poisoning or consuming toxic substances.   A surgical wound.   A blow to the kidney area.   A problem with urine flow. This may be caused by:   Cancer.    Kidney stones.   An enlarged prostate. Chronic kidney disease. The most common causes of chronic kidney disease are diabetes and high blood pressure (hypertension). Chronic kidney disease may also be caused by:   Diseases that cause the filtering units of the kidneys to become inflamed.   Diseases that affect the immune system.   Genetic diseases.   Medicines that damage the kidneys, such as anti-inflammatory medicines.  Poisoning or exposure to toxic substances.   A reoccurring kidney or urinary infection.   A problem with urine flow. This may be caused by:  Cancer.   Kidney stones.   An enlarged prostate in males. End-stage kidney disease. This kidney disease usually occurs when a chronic kidney disease gets worse. It may also occur after acute kidney injury.  SYMPTOMS   Swelling (edema) of the legs, ankles, or feet.   Tiredness (lethargy).   Nausea or vomiting.   Confusion.   Problems with urination, such as:   Painful or burning feeling during urination.   Decreased urine production.  Bloody urine.   Frequent urination, especially at night.  Hypertension.  Muscle twitches and cramps.   Shortness of breath.   Persistent itchiness.   Loss of appetite.  Metallic taste in the mouth.   Weakness.   Seizures.   Chest pain or pressure.   Trouble sleeping.   Headaches.   Abnormally dark or light skin.   Numbness in the hands or feet.   Easy bruising.   Frequent hiccups.   Menstruation stops. Sometimes, no symptoms are present. DIAGNOSIS  Kidney disease may be detected and diagnosed by tests, including blood, urine, imaging, or kidney biopsy tests.  TREATMENT  Acute kidney injury. Treatment of acute kidney injury varies depending on the cause and severity of the kidney damage. In mild cases, no treatment may be needed. The kidneys may heal on their own. If acute kidney injury is more severe, your caregiver  will treat the cause of the kidney damage, help the kidneys heal, and prevent complications from occurring. Severe cases may require a procedure to remove toxic wastes from the body (dialysis) or surgery to repair kidney damage. Surgery may involve:   Repair of a torn kidney.   Removal of an obstruction.  Most of the time, you will need to stay overnight at the hospital.  Chronic kidney disease. Most chronic kidney diseases cannot be cured. Treatment usually involves relieving symptoms and preventing or slowing the progression of the disease. Treatment may include:   A special diet. You may need to avoid alcohol and foods that:   Have added salt.   Are high in potassium.   Are high in protein.   Medicines. These may:   Lower blood pressure.   Relieve anemia.   Relieve swelling.   Protect the bones.  End-stage kidney disease. End-stage kidney disease is life-threatening and must be treated immediately. There are two treatments for end-stage kidney disease:   Dialysis.   Receiving a new kidney (kidney  transplant). Both of these treatments have serious risks and consequences. In addition to having dialysis or a kidney transplant, you may need to take medicines to control hypertension and cholesterol and to decrease phosphorus levels in your blood. LENGTH OF ILLNESS  Acute kidney injury.The length of this disease varies greatly from person to person. Exactly how long it lasts depends on the cause of the kidney damage. Acute kidney injury may develop into chronic kidney disease or end-stage kidney disease.  Chronic kidney disease. This disease usually lasts a lifetime. Chronic kidney disease may worsen over time to become end-stage kidney disease. The time it takes for end-stage kidney disease to develop varies from person to person.  End-stage kidney disease. This disease lasts until a kidney transplant is performed. PREVENTION  Kidney disease can sometimes be  prevented. If you have diabetes, hypertension, or any other condition that may lead to kidney disease, you should try to prevent kidney disease with:   An appropriate diet.  Medicine.  Lifestyle changes. FOR MORE INFORMATION  American Association of Kidney Patients: BombTimer.gl  National Kidney Foundation: www.kidney.Trooper: https://mathis.com/  [employer] [facility]: www.lifeoptions.org and www.kidneyschool.org  Document Released: 04/05/2005 Document Revised: 03/22/2012 Document Reviewed: 12/03/2011 [facility] Patient Information 2014 [place], [place].

## 2013-08-04 NOTE — ED Provider Notes (Signed)
CSN: [id number]     Arrival date & time 08/04/13  1116 History  This chart was scribed for Richarda Blade, MD by Jenne Campus, ED Scribe. This patient was seen in room APA02/APA02 and the patient's care was started at 12:20 PM.   Chief Complaint  Patient presents with  . Shortness of Breath     The history is provided by the patient. No language interpreter was used.    HPI Comments: Hannah [name] is a 43 y.o. female with a h/o CHF diagnosed in 2013 who presents to the Emergency Department complaining of constant SOB for the past week with associated weight gain. She reports that the SOB is intermittently exacerbated by laying flat and mildly by ambulation. She states that she takes Metoprolol, Demadex and Metolazone for her CHF but was recently taken off of the Metolazone 2 weeks ago due to possible kidney failure which her PCP is monitoring closely. She also has a h/o DM which she takes insulin for. She states that her CBGs have been good but were noted to be high last night. She reports that this is probably due to her recent 9 day round of prednisone for a left inguinal rash. She states that the rash has improved since the prednisone treatment. She denies any h/o MI.  Cardiologist is Dr. Stanford Breed. PCP is Dr. Buelah Manis.   Past Medical History  Diagnosis Date  . High cholesterol   . Diabetic retinopathy   . Peripheral neuropathy     "tips of toes"  . Blind right eye   . Cardiomyopathy   . CHF (congestive heart failure)   . CAD (coronary artery disease)     NO STENTS  . GERD (gastroesophageal reflux disease)   . Hypertension     under control with med., has been on med. x 1 yr.  . History of lung cancer 07/2011    s/p left lower lobectomy  . Diabetes mellitus     IDDM  . Runny nose 07/10/2012    clear drainage  . Cataract of right eye   . Ulcer of toe of right foot 07/10/2012    great toe  . Breast calcification, left 06/2012  . Nephrotic syndrome   . Nephrotic range  proteinuria   . Cancer     Adenocarcinoma of lung  . Acute biphenotypic leukemia    Past Surgical History  Procedure Laterality Date  . Cardiac catheterization  07/16/2011  . Incision and drainage breast abscess Left   . Tubal ligation  1994  . Vitrectomy  2010    2 on left, 1 on right  . Cesarean section  1991; 1994  . Video assisted thoracoscopy (vats)/ lobectomy Left 07/30/2011    left main thoracotomy, left lower lobectomy, mediastinal lymph node dissection  . Lobectomy    . Colonoscopy with esophagogastroduodenoscopy (egd) N/A 08/14/2012    Procedure: COLONOSCOPY WITH ESOPHAGOGASTRODUODENOSCOPY (EGD);  Surgeon: Danie Binder, MD;  [facility];  Service: Endoscopy;  Laterality: N/A;  10:45-moved to 1110 [address] to notify pt  . Breast lumpectomy with needle localization Left 11/14/2012    Procedure: BREAST LUMPECTOMY WITH NEEDLE LOCALIZATION;  Surgeon: Marcello Moores A. Cornett, MD;  Location: [facility];  Service: General;  Laterality: Left;   Family History  Problem Relation Age of Onset  . Coronary artery disease Father   . Asthma Father   . COPD Father   . Hypertension Father   . Hyperlipidemia Father   . Diabetes Father   .  Congestive Heart Failure Father   . Heart disease Father   . Heart attack Father   . Peripheral vascular disease Father   . Hypertension Mother   . Hyperlipidemia Mother   . Diabetes Mother   . Cancer Mother   . Cancer Maternal Aunt      three aunts, bone, breast, ?  . Hypertension Brother   . Diabetes Brother   . Diabetes Sister   . Colon cancer Neg Hx   . Celiac disease Neg Hx   . Crohn's disease Neg Hx   . Ulcerative colitis Neg Hx   . Lung cancer Maternal Grandmother    History  Substance Use Topics  . Smoking status: Former Smoker -- 2.00 packs/day for 30 years    Quit date: 08/01/2011  . Smokeless tobacco: Never Used  . Alcohol Use: No   No OB history provided.  Review of Systems  Constitutional: Negative for fever.   Respiratory: Positive for shortness of breath.   Cardiovascular: Negative for chest pain.  All other systems reviewed and are negative.   Allergies  Crestor; Nsaids; and Ciprofloxacin  Home Medications   Prior to Admission medications   Medication Sig Start Date End Date Taking? Authorizing Provider  acetaminophen (TYLENOL) 500 MG tablet Take 500 mg by mouth 3 (three) times daily as needed for pain.    Historical Provider, MD  cholecalciferol (VITAMIN D) 1000 UNITS tablet Take 1,000 Units by mouth daily.    Historical Provider, MD  cyclobenzaprine (FLEXERIL) 10 MG tablet Take 1 tablet (10 mg total) by mouth 3 (three) times daily as needed for muscle spasms. 07/06/13   Alycia Rossetti, MD  glucose blood test strip One Touch Ultra Test Strips # 100, 11 refills Monitor FSBS 3- 4 times a day for fluctuating blood sugars.  DX: 250.00 07/09/13   Alycia Rossetti, MD  hydrocortisone cream 1 % Apply 1 application topically daily as needed (when rash occurs).    Historical Provider, MD  hydrOXYzine (ATARAX/VISTARIL) 25 MG tablet Take 1 tablet (25 mg total) by mouth 3 (three) times daily as needed. 07/06/13   Alycia Rossetti, MD  Insulin Detemir (LEVEMIR FLEXPEN) 100 UNIT/ML Pen Inject 45 Units into the skin every evening.    Historical Provider, MD  insulin lispro (HUMALOG KWIKPEN) 100 UNIT/ML KiwkPen Inject 12-15 Units into the skin 3 (three) times daily with meals.    Historical Provider, MD  methylcellulose (ARTIFICIAL TEARS) 1 % ophthalmic solution Place 1 drop into both eyes at bedtime as needed (dry eyes).    Historical Provider, MD  metolazone (ZAROXOLYN) 2.5 MG tablet Take 2.5 mg by mouth every other day.  10/22/12   Historical Provider, MD  metoprolol succinate (TOPROL-XL) 25 MG 24 hr tablet Take 1 tablet (25 mg total) by mouth daily.    Alycia Rossetti, MD  metoprolol succinate (TOPROL-XL) 25 MG 24 hr tablet Take 1 tablet (25 mg total) by mouth daily. 07/08/13   Alycia Rossetti, MD   potassium chloride SA (K-DUR,KLOR-CON) 20 MEQ tablet Take 1 tablet (20 mEq total) by mouth daily. 07/08/13   Alycia Rossetti, MD  predniSONE (DELTASONE) 10 MG tablet Take 40mg  x 3 days,20mg  x 3 days, 10mg  x 3 days 07/06/13   Alycia Rossetti, MD  torsemide (DEMADEX) 20 MG tablet TAKE ONE TABLET BY MOUTH ONCE DAILY    Alycia Rossetti, MD   Triage Vitals: BP 131/114  Pulse 133  Temp(Src) 97.1 F (36.2 C) (Oral)  Resp 26  Ht 5\' 7"  (1.702 m)  Wt 286 lb (129.729 kg)  BMI 44.78 kg/m2  SpO2 98%  LMP 07/29/2013  Physical Exam  Nursing note and vitals reviewed. Constitutional: She is oriented to person, place, and time. She appears well-developed and well-nourished. No distress.  HENT:  Head: Normocephalic and atraumatic.  Eyes: Conjunctivae and EOM are normal.  Neck: Neck supple. No tracheal deviation present.  Cardiovascular: Regular rhythm.  Tachycardia present.   Pulmonary/Chest: Effort normal. No respiratory distress.  Decreased breath sounds in both bases with rales   Abdominal: Soft. She exhibits no distension. There is no tenderness.  Musculoskeletal: Normal range of motion. She exhibits edema (2+ pitting edema in BLE).  Neurological: She is alert and oriented to person, place, and time. No sensory deficit.  Skin: Skin is warm and dry.  Psychiatric: She has a normal mood and affect. Her behavior is normal.    ED Course  Procedures (including critical care time)  DIAGNOSTIC STUDIES: Oxygen Saturation is 98% on RA, normal by my interpretation.    COORDINATION OF CARE: 12:24 PM-Discussed treatment plan which includes CXR, CBC panel, BMP and troponin with pt at bedside and pt agreed to plan.   1:48 PM-Informed pt of lab work results. Discussed discharge plan with pt and pt agreed to plan. Also advised pt to follow up as needed and pt agreed. Addressed symptoms to return for with pt.   14:20- I discussed the case with Dr. Joelyn Oms, nephrologist, who recommends restarting,  metolazone at the previous dose. Findings were discussed with the patient. She agrees with this plan, all questions answered .  Nursing Notes Reviewed/ Care Coordinated Applicable Imaging Reviewed Interpretation of Laboratory Data incorporated into ED treatment  Labs Review Labs Reviewed  CBC WITH DIFFERENTIAL - Abnormal; Notable for the following:    RBC 3.41 (*)    Hemoglobin 10.5 (*)    HCT 32.0 (*)    Platelets 430 (*)    All other components within normal limits  BASIC METABOLIC PANEL - Abnormal; Notable for the following:    Glucose, Bld 195 (*)    BUN 60 (*)    Creatinine, Ser 4.08 (*)    Calcium 7.5 (*)    GFR calc non Af Amer 12 (*)    GFR calc Af Amer 14 (*)    All other components within normal limits  CBG MONITORING, ED - Abnormal; Notable for the following:    Glucose-Capillary 159 (*)    All other components within normal limits  TROPONIN I    Imaging Review Dg Chest Portable 1 View  08/04/2013   CLINICAL DATA:  Shortness of breath.  Cough this morning.  EXAM: PORTABLE CHEST - 1 VIEW  COMPARISON:  05/06/2013  FINDINGS: Apical lordotic positioning. Midline trachea. Cardiomegaly accentuated by AP portable technique. Suspicion of small bilateral pleural effusions. No pneumothorax. Mild interstitial edema with thickening of the right minor fissure. Lung bases not well evaluated secondary to AP portable technique and patient body habitus. Suspicion of bibasilar left greater than right airspace disease.  IMPRESSION: 1. Mild congestive heart failure with bilateral pleural effusions. 2. Probable bibasilar atelectasis or infection. Suboptimally evaluated secondary to technique and patient size. Consider PA and lateral radiographs if possible.   Electronically Signed   By: Abigail Miyamoto M.D.   On: 08/04/2013 12:23     EKG Interpretation   Date/Time:  Saturday August 04 2013 11:52:17 EDT Ventricular Rate:  112 PR Interval:  148 QRS Duration: 80  QT Interval:  342 QTC  Calculation: 466 R Axis:   -12 Text Interpretation:  Sinus tachycardia Possible Left atrial enlargement  Anterior infarct (cited on or before 10-Aug-2012) ST \\T \ T wave  abnormality, consider inferolateral ischemia Abnormal ECG When compared  with ECG of 10-Aug-2012 05:55, Questionable change in QRS axis Confirmed  by [facility]  MD, Vira Agar ([id number]) on 08/04/2013 1:20:41 PM      MDM  Plan: Home Medications- usual plus restart metolazone; Home Treatments- rest; return here if the recommended treatment, does not improve the symptoms; Recommended follow up- urology followup four- five days  Final diagnoses:  CHF (congestive heart failure)  Renal insufficiency    Nursing Notes Reviewed/ Care Coordinated Applicable Imaging Reviewed Interpretation of Laboratory Data incorporated into ED treatment  The patient appears reasonably screened and/or stabilized for discharge and I doubt any other medical condition or other [facility] requiring further screening, evaluation, or treatment in the ED at this time prior to discharge.     I personally performed the services described in this documentation, which was scribed in my presence. The recorded information has been reviewed and is accurate.        Richarda Blade, MD 08/04/13 [id number]

## 2013-08-13 ENCOUNTER — Encounter (HOSPITAL_COMMUNITY): Payer: Self-pay | Admitting: Emergency Medicine

## 2013-08-13 ENCOUNTER — Emergency Department (HOSPITAL_COMMUNITY): Payer: Medicare Other

## 2013-08-13 ENCOUNTER — Other Ambulatory Visit: Payer: Self-pay | Admitting: *Deleted

## 2013-08-13 ENCOUNTER — Inpatient Hospital Stay (HOSPITAL_COMMUNITY)
Admission: EM | Admit: 2013-08-13 | Discharge: 2013-08-17 | DRG: 682 | Disposition: A | Discharge status: Home / Self Care | Payer: Medicare Other | Attending: Internal Medicine | Admitting: Internal Medicine

## 2013-08-13 DIAGNOSIS — R7989 Other specified abnormal findings of blood chemistry: Secondary | ICD-10-CM

## 2013-08-13 DIAGNOSIS — Z801 Family history of malignant neoplasm of trachea, bronchus and lung: Secondary | ICD-10-CM

## 2013-08-13 DIAGNOSIS — T502X5A Adverse effect of carbonic-anhydrase inhibitors, benzothiadiazides and other diuretics, initial encounter: Secondary | ICD-10-CM | POA: Diagnosis present

## 2013-08-13 DIAGNOSIS — E7800 Pure hypercholesterolemia, unspecified: Secondary | ICD-10-CM | POA: Diagnosis present

## 2013-08-13 DIAGNOSIS — Z825 Family history of asthma and other chronic lower respiratory diseases: Secondary | ICD-10-CM

## 2013-08-13 DIAGNOSIS — I248 Other forms of acute ischemic heart disease: Secondary | ICD-10-CM | POA: Diagnosis present

## 2013-08-13 DIAGNOSIS — N19 Unspecified kidney failure: Secondary | ICD-10-CM

## 2013-08-13 DIAGNOSIS — I129 Hypertensive chronic kidney disease with stage 1 through stage 4 chronic kidney disease, or unspecified chronic kidney disease: Secondary | ICD-10-CM | POA: Diagnosis present

## 2013-08-13 DIAGNOSIS — Z87891 Personal history of nicotine dependence: Secondary | ICD-10-CM

## 2013-08-13 DIAGNOSIS — N039 Chronic nephritic syndrome with unspecified morphologic changes: Secondary | ICD-10-CM

## 2013-08-13 DIAGNOSIS — Z01810 Encounter for preprocedural cardiovascular examination: Secondary | ICD-10-CM

## 2013-08-13 DIAGNOSIS — R778 Other specified abnormalities of plasma proteins: Secondary | ICD-10-CM

## 2013-08-13 DIAGNOSIS — E1139 Type 2 diabetes mellitus with other diabetic ophthalmic complication: Secondary | ICD-10-CM | POA: Diagnosis present

## 2013-08-13 DIAGNOSIS — Z833 Family history of diabetes mellitus: Secondary | ICD-10-CM

## 2013-08-13 DIAGNOSIS — E1129 Type 2 diabetes mellitus with other diabetic kidney complication: Secondary | ICD-10-CM | POA: Diagnosis present

## 2013-08-13 DIAGNOSIS — Z8249 Family history of ischemic heart disease and other diseases of the circulatory system: Secondary | ICD-10-CM

## 2013-08-13 DIAGNOSIS — I428 Other cardiomyopathies: Secondary | ICD-10-CM | POA: Diagnosis present

## 2013-08-13 DIAGNOSIS — Z888 Allergy status to other drugs, medicaments and biological substances status: Secondary | ICD-10-CM

## 2013-08-13 DIAGNOSIS — N184 Chronic kidney disease, stage 4 (severe): Secondary | ICD-10-CM | POA: Diagnosis present

## 2013-08-13 DIAGNOSIS — Z85118 Personal history of other malignant neoplasm of bronchus and lung: Secondary | ICD-10-CM

## 2013-08-13 DIAGNOSIS — N179 Acute kidney failure, unspecified: Principal | ICD-10-CM

## 2013-08-13 DIAGNOSIS — I2510 Atherosclerotic heart disease of native coronary artery without angina pectoris: Secondary | ICD-10-CM

## 2013-08-13 DIAGNOSIS — E876 Hypokalemia: Secondary | ICD-10-CM | POA: Diagnosis present

## 2013-08-13 DIAGNOSIS — I509 Heart failure, unspecified: Secondary | ICD-10-CM

## 2013-08-13 DIAGNOSIS — I2489 Other forms of acute ischemic heart disease: Secondary | ICD-10-CM | POA: Diagnosis present

## 2013-08-13 DIAGNOSIS — Z881 Allergy status to other antibiotic agents status: Secondary | ICD-10-CM

## 2013-08-13 DIAGNOSIS — I5033 Acute on chronic diastolic (congestive) heart failure: Secondary | ICD-10-CM

## 2013-08-13 DIAGNOSIS — H5440 Blindness, one eye, unspecified eye: Secondary | ICD-10-CM | POA: Diagnosis present

## 2013-08-13 DIAGNOSIS — E1149 Type 2 diabetes mellitus with other diabetic neurological complication: Secondary | ICD-10-CM

## 2013-08-13 DIAGNOSIS — E11319 Type 2 diabetes mellitus with unspecified diabetic retinopathy without macular edema: Secondary | ICD-10-CM | POA: Diagnosis present

## 2013-08-13 DIAGNOSIS — K219 Gastro-esophageal reflux disease without esophagitis: Secondary | ICD-10-CM | POA: Diagnosis present

## 2013-08-13 DIAGNOSIS — I5031 Acute diastolic (congestive) heart failure: Secondary | ICD-10-CM | POA: Diagnosis present

## 2013-08-13 DIAGNOSIS — C9590 Leukemia, unspecified not having achieved remission: Secondary | ICD-10-CM | POA: Diagnosis present

## 2013-08-13 DIAGNOSIS — E785 Hyperlipidemia, unspecified: Secondary | ICD-10-CM

## 2013-08-13 DIAGNOSIS — I10 Essential (primary) hypertension: Secondary | ICD-10-CM

## 2013-08-13 DIAGNOSIS — N049 Nephrotic syndrome with unspecified morphologic changes: Secondary | ICD-10-CM | POA: Diagnosis present

## 2013-08-13 DIAGNOSIS — N189 Chronic kidney disease, unspecified: Secondary | ICD-10-CM | POA: Diagnosis present

## 2013-08-13 DIAGNOSIS — Z794 Long term (current) use of insulin: Secondary | ICD-10-CM

## 2013-08-13 DIAGNOSIS — E1165 Type 2 diabetes mellitus with hyperglycemia: Secondary | ICD-10-CM | POA: Diagnosis present

## 2013-08-13 DIAGNOSIS — D631 Anemia in chronic kidney disease: Secondary | ICD-10-CM | POA: Diagnosis present

## 2013-08-13 LAB — I-STAT TROPONIN, ED: Troponin i, poc: 0.16 ng/mL (ref 0.00–0.08)

## 2013-08-13 LAB — BASIC METABOLIC PANEL
BUN: 70 mg/dL — ABNORMAL HIGH (ref 6–23)
CO2: 21 meq/L (ref 19–32)
CREATININE: 4.95 mg/dL — AB (ref 0.50–1.10)
Calcium: 7.8 mg/dL — ABNORMAL LOW (ref 8.4–10.5)
Chloride: 106 mEq/L (ref 96–112)
GFR calc non Af Amer: 10 mL/min — ABNORMAL LOW (ref >90)
GFR, EST AFRICAN AMERICAN: 11 mL/min — AB (ref >90)
Glucose, Bld: 202 mg/dL — ABNORMAL HIGH (ref 70–99)
POTASSIUM: 3.9 meq/L (ref 3.7–5.3)
SODIUM: 143 meq/L (ref 137–147)

## 2013-08-13 LAB — CBC
HCT: 29.6 % — ABNORMAL LOW (ref 36.0–46.0)
Hemoglobin: 9.6 g/dL — ABNORMAL LOW (ref 12.0–15.0)
MCH: 30.1 pg (ref 26.0–34.0)
MCHC: 32.4 g/dL (ref 30.0–36.0)
MCV: 92.8 fL (ref 78.0–100.0)
PLATELETS: 343 10*3/uL (ref 150–400)
RBC: 3.19 MIL/uL — AB (ref 3.87–5.11)
RDW: 13.6 % (ref 11.5–15.5)
WBC: 8.7 10*3/uL (ref 4.0–10.5)

## 2013-08-13 LAB — PRO B NATRIURETIC PEPTIDE: PRO B NATRI PEPTIDE: 34187 pg/mL — AB (ref 0–125)

## 2013-08-13 MED ORDER — FUROSEMIDE 10 MG/ML IJ SOLN
40.0000 mg | Freq: Once | INTRAMUSCULAR | Status: AC
  Administered 2013-08-13: 40 mg via INTRAVENOUS
  Filled 2013-08-13: qty 4

## 2013-08-13 NOTE — ED Provider Notes (Signed)
CSN: [id number]     Arrival date & time 08/13/13  1932 History   First MD Initiated Contact with Patient 08/13/13 2128     Chief Complaint  Patient presents with  . Shortness of Breath     (Consider location/radiation/quality/duration/timing/severity/associated sxs/prior Treatment) Patient is a 43 y.o. female presenting with shortness of breath. The history is provided by the patient. No language interpreter was used.  Shortness of Breath Severity:  Severe Onset quality:  Gradual Duration:  2 weeks Timing:  Constant Progression:  Worsening Associated symptoms: no chest pain, no fever and no vomiting   Associated symptoms comment:  She reports SOB gradually progressing over the last 2 weeks, increasing orthopnea and exertional dyspnea. No cough or fever. She has a history of CHF and renal disease with a baseline creatinine per patient of 4.1. She is followed by Dr. Marval Regal and states that there was a recent discontinuation of her Zaroxolyn in an effort to determine if it was making her creatinine worse. She was restarted 3 days ago but dyspnea continues to worsen. She feels she is swollen in legs and abdomen. She denies chest pain but reports intermittent painful inspiration with the discomfort felt in between her shoulder blades. No nausea or vomiting.    Past Medical History  Diagnosis Date  . High cholesterol   . Diabetic retinopathy   . Peripheral neuropathy     "tips of toes"  . Blind right eye   . Cardiomyopathy   . CHF (congestive heart failure)   . CAD (coronary artery disease)     NO STENTS  . GERD (gastroesophageal reflux disease)   . Hypertension     under control with med., has been on med. x 1 yr.  . History of lung cancer 07/2011    s/p left lower lobectomy  . Diabetes mellitus     IDDM  . Runny nose 07/10/2012    clear drainage  . Cataract of right eye   . Ulcer of toe of right foot 07/10/2012    great toe  . Breast calcification, left 06/2012  . Nephrotic  syndrome   . Nephrotic range proteinuria   . Cancer     Adenocarcinoma of lung  . Acute biphenotypic leukemia    Past Surgical History  Procedure Laterality Date  . Cardiac catheterization  07/16/2011  . Incision and drainage breast abscess Left   . Tubal ligation  1994  . Vitrectomy  2010    2 on left, 1 on right  . Cesarean section  1991; 1994  . Video assisted thoracoscopy (vats)/ lobectomy Left 07/30/2011    left main thoracotomy, left lower lobectomy, mediastinal lymph node dissection  . Lobectomy    . Colonoscopy with esophagogastroduodenoscopy (egd) N/A 08/14/2012    Procedure: COLONOSCOPY WITH ESOPHAGOGASTRODUODENOSCOPY (EGD);  Surgeon: Danie Binder, MD;  [facility];  Service: Endoscopy;  Laterality: N/A;  10:45-moved to 1110 [address] to notify pt  . Breast lumpectomy with needle localization Left 11/14/2012    Procedure: BREAST LUMPECTOMY WITH NEEDLE LOCALIZATION;  Surgeon: Marcello Moores A. Cornett, MD;  Location: [place] OR;  Service: General;  Laterality: Left;   Family History  Problem Relation Age of Onset  . Coronary artery disease Father   . Asthma Father   . COPD Father   . Hypertension Father   . Hyperlipidemia Father   . Diabetes Father   . Congestive Heart Failure Father   . Heart disease Father   . Heart attack Father   .  Peripheral vascular disease Father   . Hypertension Mother   . Hyperlipidemia Mother   . Diabetes Mother   . Cancer Mother   . Cancer Maternal Aunt      three aunts, bone, breast, ?  . Hypertension Brother   . Diabetes Brother   . Diabetes Sister   . Colon cancer Neg Hx   . Celiac disease Neg Hx   . Crohn's disease Neg Hx   . Ulcerative colitis Neg Hx   . Lung cancer Maternal Grandmother    History  Substance Use Topics  . Smoking status: Former Smoker -- 2.00 packs/day for 30 years    Quit date: 08/01/2011  . Smokeless tobacco: Never Used  . Alcohol Use: No   OB History   Grav Para Term Preterm Abortions TAB SAB Ect Mult  Living                 Review of Systems  Constitutional: Negative for fever and chills.  HENT: Negative.   Respiratory: Positive for shortness of breath.   Cardiovascular: Positive for leg swelling. Negative for chest pain.  Gastrointestinal: Positive for abdominal distention. Negative for nausea and vomiting.  Musculoskeletal: Negative.   Skin: Negative.   Neurological: Negative.       Allergies  Crestor; Nsaids; and Ciprofloxacin  Home Medications   Prior to Admission medications   Medication Sig Start Date End Date Taking? Authorizing Provider  acetaminophen (TYLENOL) 500 MG tablet Take 500 mg by mouth every 8 (eight) hours as needed for mild pain.   Yes Historical Provider, MD  cholecalciferol (VITAMIN D) 1000 UNITS tablet Take 1,000 Units by mouth daily.   Yes Historical Provider, MD  cyclobenzaprine (FLEXERIL) 10 MG tablet Take 5-10 mg by mouth 3 (three) times daily as needed for muscle spasms. 07/06/13  Yes Alycia Rossetti, MD  hydrOXYzine (ATARAX/VISTARIL) 25 MG tablet Take 1 tablet (25 mg total) by mouth 3 (three) times daily as needed. 07/06/13  Yes Alycia Rossetti, MD  Insulin Detemir (LEVEMIR FLEXPEN) 100 UNIT/ML Pen Inject 45 Units into the skin every evening.   Yes Historical Provider, MD  insulin lispro (HUMALOG KWIKPEN) 100 UNIT/ML KiwkPen Inject 12-15 Units into the skin 3 (three) times daily with meals.   Yes Historical Provider, MD  methylcellulose (ARTIFICIAL TEARS) 1 % ophthalmic solution Place 1 drop into both eyes at bedtime as needed (dry eyes).   Yes Historical Provider, MD  metolazone (ZAROXOLYN) 2.5 MG tablet Take 2.5 mg by mouth every other day.   Yes Historical Provider, MD  metoprolol succinate (TOPROL-XL) 25 MG 24 hr tablet Take 1 tablet (25 mg total) by mouth daily. 07/08/13  Yes Alycia Rossetti, MD  torsemide (DEMADEX) 20 MG tablet TAKE ONE TABLET BY MOUTH ONCE DAILY   Yes Alycia Rossetti, MD   BP 139/94  Pulse 110  Temp(Src) 98 F (36.7 C)  (Oral)  Resp 20  SpO2 95%  LMP 07/29/2013 Physical Exam  Constitutional: She is oriented to person, place, and time. She appears well-developed and well-nourished.  HENT:  Head: Normocephalic.  Eyes: Conjunctivae are normal.  Neck: Normal range of motion. Neck supple.  Cardiovascular: Normal rate and regular rhythm.   No murmur heard. Pulmonary/Chest: Effort normal. She has rales.  Decrease air movement bilateral lower lobes.   Abdominal: Soft. Bowel sounds are normal. There is no tenderness. There is no rebound and no guarding.  Musculoskeletal: Normal range of motion. She exhibits edema.  Neurological: She is alert  and oriented to person, place, and time.  Skin: Skin is warm and dry. No rash noted.  Psychiatric: She has a normal mood and affect.    ED Course  Procedures (including critical care time) Labs Review Labs Reviewed  CBC - Abnormal; Notable for the following:    RBC 3.19 (*)    Hemoglobin 9.6 (*)    HCT 29.6 (*)    All other components within normal limits  BASIC METABOLIC PANEL - Abnormal; Notable for the following:    Glucose, Bld 202 (*)    BUN 70 (*)    Creatinine, Ser 4.95 (*)    Calcium 7.8 (*)    GFR calc non Af Amer 10 (*)    GFR calc Af Amer 11 (*)    All other components within normal limits  PRO B NATRIURETIC PEPTIDE - Abnormal; Notable for the following:    Pro B Natriuretic peptide (BNP) 34187.0 (*)    All other components within normal limits  I-STAT TROPOININ, ED - Abnormal; Notable for the following:    Troponin i, poc 0.16 (*)    All other components within normal limits    Imaging Review No results found.   EKG Interpretation None      MDM   Final diagnoses:  None    1. CHF 2. Renal failure  The patient continues to be SOB, CXR/presentation supporting CHF in the setting of worsening renal disease. Discussed with [facility] who accepts for admission. Discussed with Dr. Florene Glen for recommendation on Lasix dosing and to  request consultation, which he will provide.     Dewaine Oats, PA-C 08/18/13 0012

## 2013-08-13 NOTE — ED Notes (Addendum)
Pt with c/o increased SOB x 2 weeks worse with laying flat and exertion. Seen last week at [facility] for same. States she was instructed to start taking Lasix, but denies any relief. Pt speaks in complete sentences. NAD. Denies CP. No crackles noted in lungs. Hx: CHF

## 2013-08-13 NOTE — ED Provider Notes (Signed)
Date: 08/13/2013  Rate: 123  Rhythm: sinus tachycardia  QRS Axis: normal  Intervals: normal  ST/T Wave abnormalities: nonspecific T wave changes  Conduction Disutrbances:none  Narrative Interpretation:   Old EKG Reviewed: tachycardia is new compared to prior ekg of 08/04/13   Threasa Beards, MD 08/13/13 2342

## 2013-08-13 NOTE — ED Notes (Signed)
i stat Trop. Result 0.16ng/mL given to Dr. Wyvonnia Dusky

## 2013-08-14 DIAGNOSIS — N189 Chronic kidney disease, unspecified: Secondary | ICD-10-CM

## 2013-08-14 DIAGNOSIS — I5033 Acute on chronic diastolic (congestive) heart failure: Secondary | ICD-10-CM

## 2013-08-14 DIAGNOSIS — I7090 Unspecified atherosclerosis: Secondary | ICD-10-CM

## 2013-08-14 DIAGNOSIS — N179 Acute kidney failure, unspecified: Principal | ICD-10-CM

## 2013-08-14 DIAGNOSIS — E1139 Type 2 diabetes mellitus with other diabetic ophthalmic complication: Secondary | ICD-10-CM

## 2013-08-14 DIAGNOSIS — I2510 Atherosclerotic heart disease of native coronary artery without angina pectoris: Secondary | ICD-10-CM

## 2013-08-14 DIAGNOSIS — E11319 Type 2 diabetes mellitus with unspecified diabetic retinopathy without macular edema: Secondary | ICD-10-CM

## 2013-08-14 DIAGNOSIS — R7989 Other specified abnormal findings of blood chemistry: Secondary | ICD-10-CM

## 2013-08-14 DIAGNOSIS — E785 Hyperlipidemia, unspecified: Secondary | ICD-10-CM

## 2013-08-14 DIAGNOSIS — R778 Other specified abnormalities of plasma proteins: Secondary | ICD-10-CM

## 2013-08-14 DIAGNOSIS — I5031 Acute diastolic (congestive) heart failure: Secondary | ICD-10-CM

## 2013-08-14 DIAGNOSIS — I509 Heart failure, unspecified: Secondary | ICD-10-CM | POA: Insufficient documentation

## 2013-08-14 LAB — TROPONIN I
TROPONIN I: 0.35 ng/mL — AB (ref <0.30)
Troponin I: 0.34 ng/mL (ref <0.30)
Troponin I: <0.3 ng/mL (ref <0.30)

## 2013-08-14 LAB — BASIC METABOLIC PANEL
BUN: 73 mg/dL — ABNORMAL HIGH (ref 6–23)
CO2: 20 mEq/L (ref 19–32)
Calcium: 7.9 mg/dL — ABNORMAL LOW (ref 8.4–10.5)
Chloride: 103 mEq/L (ref 96–112)
Creatinine, Ser: 4.76 mg/dL — ABNORMAL HIGH (ref 0.50–1.10)
GFR, EST AFRICAN AMERICAN: 12 mL/min — AB (ref >90)
GFR, EST NON AFRICAN AMERICAN: 10 mL/min — AB (ref >90)
Glucose, Bld: 114 mg/dL — ABNORMAL HIGH (ref 70–99)
POTASSIUM: 3.2 meq/L — AB (ref 3.7–5.3)
Sodium: 141 mEq/L (ref 137–147)

## 2013-08-14 LAB — CBC
HCT: 33.1 % — ABNORMAL LOW (ref 36.0–46.0)
HEMOGLOBIN: 10.5 g/dL — AB (ref 12.0–15.0)
MCH: 29.8 pg (ref 26.0–34.0)
MCHC: 31.7 g/dL (ref 30.0–36.0)
MCV: 94 fL (ref 78.0–100.0)
Platelets: 328 10*3/uL (ref 150–400)
RBC: 3.52 MIL/uL — ABNORMAL LOW (ref 3.87–5.11)
RDW: 13.9 % (ref 11.5–15.5)
WBC: 8.3 10*3/uL (ref 4.0–10.5)

## 2013-08-14 LAB — GLUCOSE, CAPILLARY
GLUCOSE-CAPILLARY: 336 mg/dL — AB (ref 70–99)
GLUCOSE-CAPILLARY: 244 mg/dL — AB (ref 70–99)
Glucose-Capillary: 139 mg/dL — ABNORMAL HIGH (ref 70–99)
Glucose-Capillary: 185 mg/dL — ABNORMAL HIGH (ref 70–99)

## 2013-08-14 MED ORDER — INSULIN ASPART 100 UNIT/ML ~~LOC~~ SOLN
0.0000 [IU] | Freq: Three times a day (TID) | SUBCUTANEOUS | Status: DC
  Administered 2013-08-14: 2 [IU] via SUBCUTANEOUS
  Administered 2013-08-14: 7 [IU] via SUBCUTANEOUS
  Administered 2013-08-15 – 2013-08-16 (×2): 2 [IU] via SUBCUTANEOUS
  Administered 2013-08-16: 1 [IU] via SUBCUTANEOUS
  Administered 2013-08-17: 2 [IU] via SUBCUTANEOUS

## 2013-08-14 MED ORDER — POTASSIUM CHLORIDE CRYS ER 20 MEQ PO TBCR
20.0000 meq | EXTENDED_RELEASE_TABLET | Freq: Two times a day (BID) | ORAL | Status: DC
  Administered 2013-08-14 – 2013-08-17 (×7): 20 meq via ORAL
  Filled 2013-08-14 (×8): qty 1

## 2013-08-14 MED ORDER — ACETAMINOPHEN 500 MG PO TABS: 500.0000 mg | ORAL_TABLET | Freq: Three times a day (TID) | ORAL | Status: DC | PRN

## 2013-08-14 MED ORDER — INSULIN DETEMIR 100 UNIT/ML ~~LOC~~ SOLN
45.0000 [IU] | Freq: Every evening | SUBCUTANEOUS | Status: DC
  Filled 2013-08-14: qty 0.45

## 2013-08-14 MED ORDER — INSULIN ASPART 100 UNIT/ML ~~LOC~~ SOLN
3.0000 [IU] | Freq: Three times a day (TID) | SUBCUTANEOUS | Status: DC
  Administered 2013-08-14 – 2013-08-17 (×7): 3 [IU] via SUBCUTANEOUS

## 2013-08-14 MED ORDER — NON FORMULARY: 20.0000 mg | Freq: Every day | Status: DC

## 2013-08-14 MED ORDER — ASPIRIN EC 81 MG PO TBEC
81.0000 mg | DELAYED_RELEASE_TABLET | Freq: Every day | ORAL | Status: DC
  Administered 2013-08-14 – 2013-08-17 (×4): 81 mg via ORAL
  Filled 2013-08-14 (×4): qty 1

## 2013-08-14 MED ORDER — INSULIN ASPART 100 UNIT/ML ~~LOC~~ SOLN: 12.0000 [IU] | Freq: Three times a day (TID) | SUBCUTANEOUS | Status: DC

## 2013-08-14 MED ORDER — HYDROXYZINE HCL 25 MG PO TABS: 25.0000 mg | ORAL_TABLET | Freq: Three times a day (TID) | ORAL | Status: DC | PRN

## 2013-08-14 MED ORDER — INSULIN ASPART 100 UNIT/ML ~~LOC~~ SOLN
0.0000 [IU] | Freq: Every day | SUBCUTANEOUS | Status: DC
  Administered 2013-08-14: 2 [IU] via SUBCUTANEOUS
  Administered 2013-08-15: 3 [IU] via SUBCUTANEOUS

## 2013-08-14 MED ORDER — PRAVASTATIN SODIUM 20 MG PO TABS
20.0000 mg | ORAL_TABLET | Freq: Every day | ORAL | Status: DC
  Administered 2013-08-14 – 2013-08-16 (×3): 20 mg via ORAL
  Filled 2013-08-14 (×4): qty 1

## 2013-08-14 MED ORDER — HEPARIN SODIUM (PORCINE) 5000 UNIT/ML IJ SOLN
5000.0000 [IU] | Freq: Three times a day (TID) | INTRAMUSCULAR | Status: DC
  Administered 2013-08-14 – 2013-08-17 (×10): 5000 [IU] via SUBCUTANEOUS
  Filled 2013-08-14 (×14): qty 1

## 2013-08-14 MED ORDER — INSULIN DETEMIR 100 UNIT/ML ~~LOC~~ SOLN
45.0000 [IU] | Freq: Every day | SUBCUTANEOUS | Status: DC
  Administered 2013-08-14 – 2013-08-15 (×2): 45 [IU] via SUBCUTANEOUS
  Filled 2013-08-14 (×3): qty 0.45

## 2013-08-14 MED ORDER — CYCLOBENZAPRINE HCL 10 MG PO TABS
5.0000 mg | ORAL_TABLET | Freq: Three times a day (TID) | ORAL | Status: DC | PRN
  Administered 2013-08-14: 5 mg via ORAL
  Filled 2013-08-14 (×2): qty 1

## 2013-08-14 MED ORDER — SODIUM CHLORIDE 0.9 % IJ SOLN
3.0000 mL | Freq: Two times a day (BID) | INTRAMUSCULAR | Status: DC
  Administered 2013-08-14 – 2013-08-16 (×7): 3 mL via INTRAVENOUS

## 2013-08-14 MED ORDER — FUROSEMIDE 10 MG/ML IJ SOLN
80.0000 mg | Freq: Three times a day (TID) | INTRAMUSCULAR | Status: DC
  Administered 2013-08-14 – 2013-08-16 (×7): 80 mg via INTRAVENOUS
  Filled 2013-08-14 (×11): qty 8

## 2013-08-14 MED ORDER — METOPROLOL SUCCINATE ER 25 MG PO TB24
25.0000 mg | ORAL_TABLET | Freq: Every day | ORAL | Status: DC
  Administered 2013-08-14 – 2013-08-17 (×4): 25 mg via ORAL
  Filled 2013-08-14 (×4): qty 1

## 2013-08-14 NOTE — Consult Note (Signed)
Referring Physician:  Primary Physician: Primary Cardiologist: [facility] Reason for Consultation: elevated troponin   HPI:  43 year old female with stage IV-V renal failure, CAD, DM2, diastolic HF and lung CA s/p resection admitted with recurrent HF. Consult called due to elevated troponin.   Echocardiogram in February of 2013 showed an ejection fraction of 35-40% but difficult to assess because of tachycardia. There was mild mitral regurgitation. Patient was diagnosed with nephrotic syndrome.  Myoview was performed in March of 2013. This showed an ejection fraction of 49%. There was anterior and inferior ischemia. Cardiac catheterization was performed in March of 2013. There was a 30% proximal LAD followed by a 50% mid and 40% distal. There was an 80% first diagonal. There were multiple 50-60% mid circumflex lesions. There was an 80% OM 1. The right coronary was occluded but filled via collaterals from the left. Ejection fraction 55%. The diagonal and OM were felt to be too small to stent and medical therapy was recommended. F/u echo 4/14 EF 55-60%.   She had been taking torsemide 20 daily and zaroxolyn every other day until March but Zaroxolyn stopped as renal function getting worse. She started swelling again and renal function unchanged so Zaroxolyn restarted. Admitted with 2 weeks progressive HF symptoms not responding to re- addition of zaroxolyn. (weight up 22 pounds since January 2015) Denied CP. Blood work revealed hemoglobin of 9.6, creatinine of 4.95 (most recent 4.08), pBNP 34,187 slight elevation in troponin, 0.34. The 12 lead EKG showed sinus tachycardia with chronic ST-T changes  Started on lasix 80 IV tid. Starting to improve.   Review of Systems:     Cardiac Review of Systems: {Y] = yes [ ]  = no  Chest Pain [    ]  Resting SOB [  y ] Exertional SOB  [ y ]  Orthopnea [  ]   Pedal Edema [  y ]    Palpitations [  ] Syncope  [  ]   Presyncope [   ]  General Review of Systems: [Y] =  yes [  ]=no Constitional: recent weight change [  ]; anorexia [  ]; fatigue [  ]; nausea [  ]; night sweats [  ]; fever [  ]; or chills [  ];                                                                      Eyes : blurred vision [  ]; diplopia [   ]; vision changes [  ];  Amaurosis fugax[  ]; Resp: cough [  ];  wheezing[  ];  hemoptysis[  ];  PND [ y ];  GI:  gallstones[  ], vomiting[  ];  dysphagia[  ]; melena[  ];  hematochezia [  ]; heartburn[  ];   GU: kidney stones [  ]; hematuria[  ];   dysuria [  ];  nocturia[  ]; incontinence [  ];             Skin: rash, swelling[  ];, hair loss[  ];  peripheral edema[  ];  or itching[  ]; Musculosketetal: myalgias[  ];  joint swelling[  ];  joint erythema[  ];  joint pain[ y ];  back pain[  ];  Heme/Lymph: bruising[  ];  bleeding[  ];  anemia[  ];  Neuro: TIA[  ];  headaches[  ];  stroke[  ];  vertigo[  ];  seizures[  ];   paresthesias[  ];  difficulty walking[  ];  Psych:depression[  ]; anxiety[  ];  Endocrine: diabetes[ y ];  thyroid dysfunction[  ];  Other:  Past Medical History  Diagnosis Date  . High cholesterol   . Diabetic retinopathy   . Peripheral neuropathy     "tips of toes"  . Blind right eye   . Cardiomyopathy   . CHF (congestive heart failure)   . CAD (coronary artery disease)     NO STENTS  . GERD (gastroesophageal reflux disease)   . Hypertension     under control with med., has been on med. x 1 yr.  . History of lung cancer 07/2011    s/p left lower lobectomy  . Diabetes mellitus     IDDM  . Runny nose 07/10/2012    clear drainage  . Cataract of right eye   . Ulcer of toe of right foot 07/10/2012    great toe  . Breast calcification, left 06/2012  . Nephrotic syndrome   . Nephrotic range proteinuria   . Cancer     Adenocarcinoma of lung  . Acute biphenotypic leukemia     Medications Prior to Admission  Medication Sig Dispense Refill  . acetaminophen (TYLENOL) 500 MG tablet Take 500 mg by mouth every 8 (eight)  hours as needed for mild pain.      . cholecalciferol (VITAMIN D) 1000 UNITS tablet Take 1,000 Units by mouth daily.      . cyclobenzaprine (FLEXERIL) 10 MG tablet Take 5-10 mg by mouth 3 (three) times daily as needed for muscle spasms.      . hydrOXYzine (ATARAX/VISTARIL) 25 MG tablet Take 1 tablet (25 mg total) by mouth 3 (three) times daily as needed.  30 tablet  0  . Insulin Detemir (LEVEMIR FLEXPEN) 100 UNIT/ML Pen Inject 45 Units into the skin every evening.      . insulin lispro (HUMALOG KWIKPEN) 100 UNIT/ML KiwkPen Inject 12-15 Units into the skin 3 (three) times daily with meals.      . methylcellulose (ARTIFICIAL TEARS) 1 % ophthalmic solution Place 1 drop into both eyes at bedtime as needed (dry eyes).      . metolazone (ZAROXOLYN) 2.5 MG tablet Take 2.5 mg by mouth every other day.      . metoprolol succinate (TOPROL-XL) 25 MG 24 hr tablet Take 1 tablet (25 mg total) by mouth daily.  30 tablet  6  . torsemide (DEMADEX) 20 MG tablet TAKE ONE TABLET BY MOUTH ONCE DAILY  30 tablet  3     . furosemide  80 mg Intravenous 3 times per day  . heparin  5,000 Units Subcutaneous 3 times per day  . insulin aspart  0-5 Units Subcutaneous QHS  . insulin aspart  0-9 Units Subcutaneous TID WC  . insulin aspart  3 Units Subcutaneous TID WC  . insulin detemir  45 Units Subcutaneous QPM  . metoprolol succinate  25 mg Oral Daily  . potassium chloride  20 mEq Oral BID  . sodium chloride  3 mL Intravenous Q12H    Infusions:    Allergies  Allergen Reactions  . Crestor [Rosuvastatin] Other (See Comments)    Severe muscle weakness  . Nsaids     Not allergic, "bad  on my kidneys"  . Ciprofloxacin Rash    History   Social History  . Marital Status: Married    Spouse Name: N/A    Number of Children: 3  . Years of Education: N/A   Occupational History  . Not on file.   Social History Main Topics  . Smoking status: Former Smoker -- 2.00 packs/day for 30 years    Quit date: 08/01/2011    . Smokeless tobacco: Never Used  . Alcohol Use: No  . Drug Use: No  . Sexual Activity: Yes   Other Topics Concern  . Not on file   Social History Narrative  . No narrative on file    Family History  Problem Relation Age of Onset  . Coronary artery disease Father   . Asthma Father   . COPD Father   . Hypertension Father   . Hyperlipidemia Father   . Diabetes Father   . Congestive Heart Failure Father   . Heart disease Father   . Heart attack Father   . Peripheral vascular disease Father   . Hypertension Mother   . Hyperlipidemia Mother   . Diabetes Mother   . Cancer Mother   . Cancer Maternal Aunt      three aunts, bone, breast, ?  . Hypertension Brother   . Diabetes Brother   . Diabetes Sister   . Colon cancer Neg Hx   . Celiac disease Neg Hx   . Crohn's disease Neg Hx   . Ulcerative colitis Neg Hx   . Lung cancer Maternal Grandmother     PHYSICAL EXAM: Filed Vitals:   08/14/13 1021  BP: 136/85  Pulse: 110  Temp:   Resp:     No intake or output data in the 24 hours ending 08/14/13 1250  General:  Sitting up on side of bed No respiratory difficulty HEENT: normal Neck: supple.JVP to jaw Carotids 2+ bilat; no bruits. No lymphadenopathy or thryomegaly appreciated. Cor: PMI nondisplaced. Regular tachy  +s4 no s3.  Lungs: clear Abdomen: obsee nontender, mildly distended. No hepatosplenomegaly. No bruits or masses. Good bowel sounds. Extremities: no cyanosis, clubbing, rash, 2-3+ edema Neuro: alert & oriented x 3, cranial nerves grossly intact. moves all 4 extremities w/o difficulty. Affect pleasant.  ECG: ST 120 with inferolateral ST&T wave abnormalities (unchanged)  Results for orders placed during the hospital encounter of 08/13/13 (from the past 24 hour(s))  CBC     Status: Abnormal   Collection Time    08/13/13  7:49 PM      Result Value Ref Range   WBC 8.7  4.0 - 10.5 K/uL   RBC 3.19 (*) 3.87 - 5.11 MIL/uL   Hemoglobin 9.6 (*) 12.0 - 15.0 g/dL    HCT 29.6 (*) 36.0 - 46.0 %   MCV 92.8  78.0 - 100.0 fL   MCH 30.1  26.0 - 34.0 pg   MCHC 32.4  30.0 - 36.0 g/dL   RDW 13.6  11.5 - 15.5 %   Platelets 343  150 - 400 K/uL  BASIC METABOLIC PANEL     Status: Abnormal   Collection Time    08/13/13  7:49 PM      Result Value Ref Range   Sodium 143  137 - 147 mEq/L   Potassium 3.9  3.7 - 5.3 mEq/L   Chloride 106  96 - 112 mEq/L   CO2 21  19 - 32 mEq/L   Glucose, Bld 202 (*) 70 - 99 mg/dL  BUN 70 (*) 6 - 23 mg/dL   Creatinine, Ser 4.95 (*) 0.50 - 1.10 mg/dL   Calcium 7.8 (*) 8.4 - 10.5 mg/dL   GFR calc non Af Amer 10 (*) >90 mL/min   GFR calc Af Amer 11 (*) >90 mL/min  PRO B NATRIURETIC PEPTIDE     Status: Abnormal   Collection Time    08/13/13  7:49 PM      Result Value Ref Range   Pro B Natriuretic peptide (BNP) 34187.0 (*) 0 - 125 pg/mL  I-STAT TROPOININ, ED     Status: Abnormal   Collection Time    08/13/13  8:06 PM      Result Value Ref Range   Troponin i, poc 0.16 (*) 0.00 - 0.08 ng/mL   Comment NOTIFIED PHYSICIAN     Comment 3           CBC     Status: Abnormal   Collection Time    08/14/13  4:55 AM      Result Value Ref Range   WBC 8.3  4.0 - 10.5 K/uL   RBC 3.52 (*) 3.87 - 5.11 MIL/uL   Hemoglobin 10.5 (*) 12.0 - 15.0 g/dL   HCT 33.1 (*) 36.0 - 46.0 %   MCV 94.0  78.0 - 100.0 fL   MCH 29.8  26.0 - 34.0 pg   MCHC 31.7  30.0 - 36.0 g/dL   RDW 13.9  11.5 - 15.5 %   Platelets 328  150 - 400 K/uL  BASIC METABOLIC PANEL     Status: Abnormal   Collection Time    08/14/13  4:55 AM      Result Value Ref Range   Sodium 141  137 - 147 mEq/L   Potassium 3.2 (*) 3.7 - 5.3 mEq/L   Chloride 103  96 - 112 mEq/L   CO2 20  19 - 32 mEq/L   Glucose, Bld 114 (*) 70 - 99 mg/dL   BUN 73 (*) 6 - 23 mg/dL   Creatinine, Ser 4.76 (*) 0.50 - 1.10 mg/dL   Calcium 7.9 (*) 8.4 - 10.5 mg/dL   GFR calc non Af Amer 10 (*) >90 mL/min   GFR calc Af Amer 12 (*) >90 mL/min  TROPONIN I     Status: Abnormal   Collection Time    08/14/13  4:55  AM      Result Value Ref Range   Troponin I 0.34 (*) <0.30 ng/mL  GLUCOSE, CAPILLARY     Status: Abnormal   Collection Time    08/14/13  7:22 AM      Result Value Ref Range   Glucose-Capillary 139 (*) 70 - 99 mg/dL   Comment 1 Documented in Chart     Comment 2 Notify RN    TROPONIN I     Status: Abnormal   Collection Time    08/14/13  7:45 AM      Result Value Ref Range   Troponin I 0.35 (*) <0.30 ng/mL  GLUCOSE, CAPILLARY     Status: Abnormal   Collection Time    08/14/13 11:57 AM      Result Value Ref Range   Glucose-Capillary 185 (*) 70 - 99 mg/dL   Comment 1 Documented in Chart     Comment 2 Notify RN     Dg Chest Portable 1 View  08/13/2013   CLINICAL DATA:  Shortness of breath increasing for 2 weeks. History of CHF.  EXAM: PORTABLE CHEST - 1  VIEW  COMPARISON:  08/04/2013  FINDINGS: Again noted are bibasilar lung densities that are suggestive for pleural effusions and atelectasis. Heart size appears to be enlarged. There is some peribronchial thickening and cannot exclude pulmonary edema.  IMPRESSION: Bilateral pleural effusions with basilar atelectasis. There is mild pulmonary edema. Minimal change from the previous examination.   Electronically Signed   By: Markus Daft M.D.   On: 08/13/2013 23:10     ASSESSMENT:  1. Elevated troponin 2. A/C diastolic HF 3. Stage IV-V chronic renal failure 4. CAD, by cath 2013 5. DM2  PLAN/DISCUSSION:  She does have underlying CAD but no clear signs ischemia. Suspect troponin elevation likely due to HF. She is markedly volume overloaded 9weight up > 20 pounds since January).   Agree with diuresis. She seems to be nearing the point where she may need HD. Volume management per Renal. Previously intolerant of Crestor. Will start pravastatin and ASA.   Shaune Pascal Bensimhon,MD 12:58 PM

## 2013-08-14 NOTE — Progress Notes (Signed)
Utilization review completed.  

## 2013-08-14 NOTE — H&P (Addendum)
[facility] History and Physical  Hannah [name] TDD:[id number] DOB: Jan 14, 1971 DOA: 08/13/2013  Referring physician: EDP PCP: Vic Blackbird, MD   Chief Complaint: SOB   HPI: Hannah [name] is a 43 y.o. female who presents to the ED with worsening SOB, peripheral edema, for the past 2 weeks.  This has been getting progressively worse.  There is orthopnea and exertional dyspnea, no cough nor fever, no chest pain.  She is followed by Dr. Marval Regal for worsening CKD, recently discontinued zaroxolyn due to worsening of creatinine but had to restart this due to development of SOB and CHF symptoms.  Now despite zaroxolyn she has SOB and CHF symptoms.  Review of Systems: Systems reviewed.  As above, otherwise negative  Past Medical History  Diagnosis Date  . High cholesterol   . Diabetic retinopathy   . Peripheral neuropathy     "tips of toes"  . Blind right eye   . Cardiomyopathy   . CHF (congestive heart failure)   . CAD (coronary artery disease)     NO STENTS  . GERD (gastroesophageal reflux disease)   . Hypertension     under control with med., has been on med. x 1 yr.  . History of lung cancer 07/2011    s/p left lower lobectomy  . Diabetes mellitus     IDDM  . Runny nose 07/10/2012    clear drainage  . Cataract of right eye   . Ulcer of toe of right foot 07/10/2012    great toe  . Breast calcification, left 06/2012  . Nephrotic syndrome   . Nephrotic range proteinuria   . Cancer     Adenocarcinoma of lung  . Acute biphenotypic leukemia    Past Surgical History  Procedure Laterality Date  . Cardiac catheterization  07/16/2011  . Incision and drainage breast abscess Left   . Tubal ligation  1994  . Vitrectomy  2010    2 on left, 1 on right  . Cesarean section  1991; 1994  . Video assisted thoracoscopy (vats)/ lobectomy Left 07/30/2011    left main thoracotomy, left lower lobectomy, mediastinal lymph node dissection  . Lobectomy    . Colonoscopy with  esophagogastroduodenoscopy (egd) N/A 08/14/2012    Procedure: COLONOSCOPY WITH ESOPHAGOGASTRODUODENOSCOPY (EGD);  Surgeon: Danie Binder, MD;  Location: [facility];  Service: Endoscopy;  Laterality: N/A;  10:45-moved to [address] to notify pt  . Breast lumpectomy with needle localization Left 11/14/2012    Procedure: BREAST LUMPECTOMY WITH NEEDLE LOCALIZATION;  Surgeon: Marcello Moores A. Cornett, MD;  Location: [place];  Service: General;  Laterality: Left;   Social History:  reports that she quit smoking about 2 years ago. She has never used smokeless tobacco. She reports that she does not drink alcohol or use illicit drugs.  Allergies  Allergen Reactions  . Crestor [Rosuvastatin] Other (See Comments)    Severe muscle weakness  . Nsaids     Not allergic, "bad on my kidneys"  . Ciprofloxacin Rash    Family History  Problem Relation Age of Onset  . Coronary artery disease Father   . Asthma Father   . COPD Father   . Hypertension Father   . Hyperlipidemia Father   . Diabetes Father   . Congestive Heart Failure Father   . Heart disease Father   . Heart attack Father   . Peripheral vascular disease Father   . Hypertension Mother   . Hyperlipidemia Mother   . Diabetes Mother   .  Cancer Mother   . Cancer Maternal Aunt      three aunts, bone, breast, ?  . Hypertension Brother   . Diabetes Brother   . Diabetes Sister   . Colon cancer Neg Hx   . Celiac disease Neg Hx   . Crohn's disease Neg Hx   . Ulcerative colitis Neg Hx   . Lung cancer Maternal Grandmother      Prior to Admission medications   Medication Sig Start Date End Date Taking? Authorizing Provider  acetaminophen (TYLENOL) 500 MG tablet Take 500 mg by mouth every 8 (eight) hours as needed for mild pain.   Yes Historical Provider, MD  cholecalciferol (VITAMIN D) 1000 UNITS tablet Take 1,000 Units by mouth daily.   Yes Historical Provider, MD  cyclobenzaprine (FLEXERIL) 10 MG tablet Take 5-10 mg by mouth 3 (three) times  daily as needed for muscle spasms. 07/06/13  Yes Alycia Rossetti, MD  hydrOXYzine (ATARAX/VISTARIL) 25 MG tablet Take 1 tablet (25 mg total) by mouth 3 (three) times daily as needed. 07/06/13  Yes Alycia Rossetti, MD  Insulin Detemir (LEVEMIR FLEXPEN) 100 UNIT/ML Pen Inject 45 Units into the skin every evening.   Yes Historical Provider, MD  insulin lispro (HUMALOG KWIKPEN) 100 UNIT/ML KiwkPen Inject 12-15 Units into the skin 3 (three) times daily with meals.   Yes Historical Provider, MD  methylcellulose (ARTIFICIAL TEARS) 1 % ophthalmic solution Place 1 drop into both eyes at bedtime as needed (dry eyes).   Yes Historical Provider, MD  metolazone (ZAROXOLYN) 2.5 MG tablet Take 2.5 mg by mouth every other day.   Yes Historical Provider, MD  metoprolol succinate (TOPROL-XL) 25 MG 24 hr tablet Take 1 tablet (25 mg total) by mouth daily. 07/08/13  Yes Alycia Rossetti, MD  torsemide (DEMADEX) 20 MG tablet TAKE ONE TABLET BY MOUTH ONCE DAILY   Yes Alycia Rossetti, MD   Physical Exam: Filed Vitals:   08/14/13 0030  BP: 145/74  Pulse: 108  Temp:   Resp: 19    BP 145/74  Pulse 108  Temp(Src) 98 F (36.7 C) (Oral)  Resp 19  SpO2 97%  LMP 07/29/2013  General Appearance:    Alert, oriented, no distress, appears stated age  Head:    Normocephalic, atraumatic  Eyes:    PERRL, EOMI, sclera non-icteric        Nose:   Nares without drainage or epistaxis. Mucosa, turbinates normal  Throat:   Moist mucous membranes. Oropharynx without erythema or exudate.  Neck:   Supple. No carotid bruits.  No thyromegaly.  No lymphadenopathy.   Back:     No CVA tenderness, no spinal tenderness  Lungs:     Clear to auscultation bilaterally, without wheezes, rhonchi or rales  Chest wall:    No tenderness to palpitation  Heart:    Regular rate and rhythm without murmurs, gallops, rubs  Abdomen:     Soft, non-tender, nondistended, normal bowel sounds, no organomegaly  Genitalia:    deferred  Rectal:    deferred   Extremities:   No clubbing, cyanosis or edema.  Pulses:   2+ and symmetric all extremities  Skin:   Skin color, texture, turgor normal, no rashes or lesions  Lymph nodes:   Cervical, supraclavicular, and axillary nodes normal  Neurologic:   CNII-XII intact. Normal strength, sensation and reflexes      throughout    Labs on Admission:  Basic Metabolic Panel:  Recent Labs Lab 08/13/13 1949  NA  143  K 3.9  CL 106  CO2 21  GLUCOSE 202*  BUN 70*  CREATININE 4.95*  CALCIUM 7.8*   Liver Function Tests: No results found for this basename: AST, ALT, ALKPHOS, BILITOT, PROT, ALBUMIN,  in the last 168 hours No results found for this basename: LIPASE, AMYLASE,  in the last 168 hours No results found for this basename: AMMONIA,  in the last 168 hours CBC:  Recent Labs Lab 08/13/13 1949  WBC 8.7  HGB 9.6*  HCT 29.6*  MCV 92.8  PLT 343   Cardiac Enzymes: No results found for this basename: CKTOTAL, CKMB, CKMBINDEX, TROPONINI,  in the last 168 hours  BNP (last 3 results)  Recent Labs  08/13/13 1949  PROBNP 34187.0*   CBG: No results found for this basename: GLUCAP,  in the last 168 hours  Radiological Exams on Admission: Dg Chest Portable 1 View  08/13/2013   CLINICAL DATA:  Shortness of breath increasing for 2 weeks. History of CHF.  EXAM: PORTABLE CHEST - 1 VIEW  COMPARISON:  08/04/2013  FINDINGS: Again noted are bibasilar lung densities that are suggestive for pleural effusions and atelectasis. Heart size appears to be enlarged. There is some peribronchial thickening and cannot exclude pulmonary edema.  IMPRESSION: Bilateral pleural effusions with basilar atelectasis. There is mild pulmonary edema. Minimal change from the previous examination.   Electronically Signed   By: Markus Daft M.D.   On: 08/13/2013 23:10    EKG: Independently reviewed.  Assessment/Plan Principal Problem:   Acute on chronic renal failure Active Problems:   Diastolic CHF, acute   Diabetic  retinopathy associated with type 2 diabetes mellitus   Nephrotic syndrome   1. Acute on chronic renal failure - per patient there had already been discussions with nephrologist about need for dialysis in the near future (has appointment with vascular surgery for access placement next month).  We will try lasix 80mg  IV q8h in an attempt to get rid of some of her fluid overload; however, I fear her renal function may have deteriorated too far already and dialysis may be indicated during this admission.  Nephrology consulted (recommended the above lasix dose), and will see patient in AM. 2. Acute diastolic CHF - EKG unchanged and no chest pain, slight bump in troponin, will trend this to see if this goes anywhere but I doubt a primary cardiac cause in this case, more likely fluid retention due to issue #1 above is causing her decompensation. 3. DM2 - continue home insulin with CBG checks AC/HS    Code Status: Full  Family Communication: Husband at bedside Disposition Plan: Admit to inpatient   Time spent: 32 min  [facility] Pager [phone]  If 7AM-7PM, please contact the day team taking care of the patient [email or web address] Password [email or web address] 08/14/2013, 1:01 AM

## 2013-08-14 NOTE — Consult Note (Signed)
Referring Provider: No ref. provider found Primary Care Physician:  Vic Blackbird, MD Primary Nephrologist:  Dr. Marval Regal  Reason for Consultation:  CKD stage 4/5 . Volume overload  HPI:  Hannah [name] is a 43 y.o. female who presents to the ED with worsening SOB, peripheral edema, for the past 2 weeks. This has been getting progressively worse with simultaneous weight gain of 10-20 lbs. There is orthopnea and exertional dyspnea, no cough nor fever, no chest pain. She is followed by Dr. Marval Regal for worsening CKD, recently discontinued zaroxolyn due to worsening of creatinine but had to restart this due to development of SOB and CHF symptoms. Now despite zaroxolyn she has SOB and CHF symptoms. She states that weight has not improved with zaroxolyn and urine output has not been brisk. Interestingly, she has only been taking 20mg  daily of torsemide  Past Medical History  Diagnosis Date  . High cholesterol   . Diabetic retinopathy   . Peripheral neuropathy     "tips of toes"  . Blind right eye   . Cardiomyopathy   . CHF (congestive heart failure)   . CAD (coronary artery disease)     NO STENTS  . GERD (gastroesophageal reflux disease)   . Hypertension     under control with med., has been on med. x 1 yr.  . History of lung cancer 07/2011    s/p left lower lobectomy  . Diabetes mellitus     IDDM  . Runny nose 07/10/2012    clear drainage  . Cataract of right eye   . Ulcer of toe of right foot 07/10/2012    great toe  . Breast calcification, left 06/2012  . Nephrotic syndrome   . Nephrotic range proteinuria   . Cancer     Adenocarcinoma of lung  . Acute biphenotypic leukemia     Past Surgical History  Procedure Laterality Date  . Cardiac catheterization  07/16/2011  . Incision and drainage breast abscess Left   . Tubal ligation  1994  . Vitrectomy  2010    2 on left, 1 on right  . Cesarean section  1991; 1994  . Video assisted thoracoscopy (vats)/ lobectomy Left  07/30/2011    left main thoracotomy, left lower lobectomy, mediastinal lymph node dissection  . Lobectomy    . Colonoscopy with esophagogastroduodenoscopy (egd) N/A 08/14/2012    Procedure: COLONOSCOPY WITH ESOPHAGOGASTRODUODENOSCOPY (EGD);  Surgeon: Danie Binder, MD;  Location: [facility];  Service: Endoscopy;  Laterality: N/A;  10:45-moved to [address] to notify pt  . Breast lumpectomy with needle localization Left 11/14/2012    Procedure: BREAST LUMPECTOMY WITH NEEDLE LOCALIZATION;  Surgeon: Marcello Moores A. Cornett, MD;  Location: [place];  Service: General;  Laterality: Left;    Prior to Admission medications   Medication Sig Start Date End Date Taking? Authorizing Provider  acetaminophen (TYLENOL) 500 MG tablet Take 500 mg by mouth every 8 (eight) hours as needed for mild pain.   Yes Historical Provider, MD  cholecalciferol (VITAMIN D) 1000 UNITS tablet Take 1,000 Units by mouth daily.   Yes Historical Provider, MD  cyclobenzaprine (FLEXERIL) 10 MG tablet Take 5-10 mg by mouth 3 (three) times daily as needed for muscle spasms. 07/06/13  Yes Alycia Rossetti, MD  hydrOXYzine (ATARAX/VISTARIL) 25 MG tablet Take 1 tablet (25 mg total) by mouth 3 (three) times daily as needed. 07/06/13  Yes Alycia Rossetti, MD  Insulin Detemir (LEVEMIR FLEXPEN) 100 UNIT/ML Pen Inject 45 Units into  the skin every evening.   Yes Historical Provider, MD  insulin lispro (HUMALOG KWIKPEN) 100 UNIT/ML KiwkPen Inject 12-15 Units into the skin 3 (three) times daily with meals.   Yes Historical Provider, MD  methylcellulose (ARTIFICIAL TEARS) 1 % ophthalmic solution Place 1 drop into both eyes at bedtime as needed (dry eyes).   Yes Historical Provider, MD  metolazone (ZAROXOLYN) 2.5 MG tablet Take 2.5 mg by mouth every other day.   Yes Historical Provider, MD  metoprolol succinate (TOPROL-XL) 25 MG 24 hr tablet Take 1 tablet (25 mg total) by mouth daily. 07/08/13  Yes Alycia Rossetti, MD  torsemide (DEMADEX) 20 MG tablet  TAKE ONE TABLET BY MOUTH ONCE DAILY   Yes Alycia Rossetti, MD    Current Facility-Administered Medications  Medication Dose Route Frequency Provider Last Rate Last Dose  . acetaminophen (TYLENOL) tablet 500 mg  500 mg Oral Q8H PRN Etta Quill, DO      . cyclobenzaprine (FLEXERIL) tablet 5-10 mg  5-10 mg Oral TID PRN Etta Quill, DO      . furosemide (LASIX) injection 80 mg  80 mg Intravenous 3 times per day Etta Quill, DO   80 mg at 08/14/13 0534  . heparin injection 5,000 Units  5,000 Units Subcutaneous 3 times per day Etta Quill, DO   5,000 Units at 08/14/13 1610  . hydrOXYzine (ATARAX/VISTARIL) tablet 25 mg  25 mg Oral TID PRN Etta Quill, DO      . insulin aspart (novoLOG) injection 0-5 Units  0-5 Units Subcutaneous QHS Theodis Blaze, MD      . insulin aspart (novoLOG) injection 0-9 Units  0-9 Units Subcutaneous TID WC Theodis Blaze, MD      . insulin aspart (novoLOG) injection 3 Units  3 Units Subcutaneous TID WC Theodis Blaze, MD      . insulin detemir (LEVEMIR) injection 45 Units  45 Units Subcutaneous QPM Etta Quill, DO      . metoprolol succinate (TOPROL-XL) 24 hr tablet 25 mg  25 mg Oral Daily Etta Quill, DO   25 mg at 08/14/13 1021  . sodium chloride 0.9 % injection 3 mL  3 mL Intravenous Q12H Etta Quill, DO   3 mL at 08/14/13 1018    Allergies as of 08/13/2013 - Review Complete 08/13/2013  Allergen Reaction Noted  . Crestor [rosuvastatin] Other (See Comments) 11/02/2012  . Nsaids  05/05/2013  . Ciprofloxacin Rash 05/31/2011    Family History  Problem Relation Age of Onset  . Coronary artery disease Father   . Asthma Father   . COPD Father   . Hypertension Father   . Hyperlipidemia Father   . Diabetes Father   . Congestive Heart Failure Father   . Heart disease Father   . Heart attack Father   . Peripheral vascular disease Father   . Hypertension Mother   . Hyperlipidemia Mother   . Diabetes Mother   . Cancer Mother   . Cancer  Maternal Aunt      three aunts, bone, breast, ?  . Hypertension Brother   . Diabetes Brother   . Diabetes Sister   . Colon cancer Neg Hx   . Celiac disease Neg Hx   . Crohn's disease Neg Hx   . Ulcerative colitis Neg Hx   . Lung cancer Maternal Grandmother     History   Social History  . Marital Status: Married    Spouse  Name: N/A    Number of Children: 3  . Years of Education: N/A   Occupational History  . Not on file.   Social History Main Topics  . Smoking status: Former Smoker -- 2.00 packs/day for 30 years    Quit date: 08/01/2011  . Smokeless tobacco: Never Used  . Alcohol Use: No  . Drug Use: No  . Sexual Activity: Yes   Other Topics Concern  . Not on file   Social History Narrative  . No narrative on file    Review of Systems: Gen: Denies any fever, chills, sweats, anorexia, fatigue, weakness, malaise, weight loss, and sleep disorder HEENT: No visual complaints, history of Retinopathy. Normal external appearance No Epistaxis or Sore throat. No sinusitis.   CV: weight gain and dyspnea with orthopnea and lower extremity edema. Resp:+dyspnea at rest, dyspnea with exercise, cough, sputum, wheezing, coughing up blood, and pleurisy. GI: Denies vomiting blood, jaundice, and fecal incontinence.   Denies dysphagia or odynophagia. GU : Denies urinary burning, blood in urine, urinary frequency, urinary hesitancy, nocturnal urination, and urinary incontinence.  No renal calculi. MS: Denies joint pain, limitation of movement, and swelling, stiffness, low back pain, extremity pain. Denies muscle weakness, cramps, atrophy.  No use of non steroidal antiinflammatory drugs. Derm: Denies rash, itching, dry skin, hives, moles, warts, or unhealing ulcers.  Psych: Denies depression, anxiety, memory loss, suicidal ideation, hallucinations, paranoia, and confusion. Heme: Denies bruising, bleeding, and enlarged lymph nodes. Neuro: No headache.  No diplopia. No dysarthria.  No  dysphasia.  No history of CVA.  No Seizures. No paresthesias.  No weakness. Endocrine Diabetic  No Thyroid disease.  No Adrenal disease.  Physical Exam: Vital signs in last 24 hours: Temp:  [97.7 F (36.5 C)-98 F (36.7 C)] 97.7 F (36.5 C) (04/28 0532) Pulse Rate:  [108-123] 110 (04/28 1021) Resp:  [17-24] 18 (04/28 0532) BP: (97-147)/(47-107) 136/85 mmHg (04/28 1021) SpO2:  [94 %-99 %] 99 % (04/28 0532) Weight:  [131.18 kg (289 lb 3.2 oz)] 131.18 kg (289 lb 3.2 oz) (04/28 0105) Last BM Date: 08/14/13 General:   Alert,  Well-developed,pleasant and cooperative in NAD Head:  Normocephalic and atraumatic. Eyes:  Sclera clear, no icterus.   Conjunctiva pink. Ears:  Normal auditory acuity. Nose:  No deformity, discharge,  or lesions. Mouth:  No deformity or lesions, dentition normal. Neck:  Supple; no masses or thyromegaly. JVP not elevated  Lungs:  Diminshed air entry at bases Heart:  Regular rate and rhythm; no murmurs, clicks, rubs,  or gallops. Abdomen:  Soft, nontender and nondistended. No masses, hepatosplenomegaly or hernias noted. Normal bowel sounds, without guarding, and without rebound.  Obese Msk:  Symmetrical without gross deformities. Normal posture. Pulses:  No carotid, renal, femoral bruits. DP and PT symmetrical and equal Extremities:  Without clubbing   1 + edema Neurologic:  Alert and  oriented x4;  grossly normal neurologically. Skin:  Intact without significant lesions or rashes. Cervical Nodes:  No significant cervical adenopathy. Psych:  Alert and cooperative. Normal mood and affect.  Intake/Output from previous day:   Intake/Output this shift:    Lab Results:  Recent Labs  08/13/13 1949 08/14/13 0455  WBC 8.7 8.3  HGB 9.6* 10.5*  HCT 29.6* 33.1*  PLT 343 328   BMET  Recent Labs  08/13/13 1949 08/14/13 0455  NA 143 141  K 3.9 3.2*  CL 106 103  CO2 21 20  GLUCOSE 202* 114*  BUN 70* 73*  CREATININE 4.95* 4.76*  CALCIUM 7.8* 7.9*    LFT No results found for this basename: PROT, ALBUMIN, AST, ALT, ALKPHOS, BILITOT, BILIDIR, IBILI,  in the last 72 hours PT/INR No results found for this basename: LABPROT, INR,  in the last 72 hours Hepatitis Panel No results found for this basename: HEPBSAG, HCVAB, HEPAIGM, HEPBIGM,  in the last 72 hours  Studies/Results: Dg Chest Portable 1 View  08/13/2013   CLINICAL DATA:  Shortness of breath increasing for 2 weeks. History of CHF.  EXAM: PORTABLE CHEST - 1 VIEW  COMPARISON:  08/04/2013  FINDINGS: Again noted are bibasilar lung densities that are suggestive for pleural effusions and atelectasis. Heart size appears to be enlarged. There is some peribronchial thickening and cannot exclude pulmonary edema.  IMPRESSION: Bilateral pleural effusions with basilar atelectasis. There is mild pulmonary edema. Minimal change from the previous examination.   Electronically Signed   By: Markus Daft M.D.   On: 08/13/2013 23:10    Assessment/Plan: 1. 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 HTN/Volume will increase diuretics to promote weight loss   3. Anemia stable  4. Disposition   Patient responding to diuretics would not dialyze at this point  LOS: [place] @TODAY @11 :13 AM

## 2013-08-14 NOTE — Progress Notes (Signed)
Inpatient Diabetes Program Recommendations  AACE/ADA: New Consensus Statement on Inpatient Glycemic Control (2013)  Target Ranges:  Prepandial:   less than 140 mg/dL      Peak postprandial:   less than 180 mg/dL (1-2 hours)      Critically ill patients:  140 - 180 mg/dL    Admitted with SOB.  Has history of CKD, DM, CHF, CAD.  Home DM Meds:  Levemir 45 units QPM + Humalog 12-15 units tid with meals per SSI    MD- Note that patient currently has range order for Novolog (which was ordered from [place] list).  Please consider the following:  1. D/C current range Novolog order 2. Start Novolog Moderate SSI tid ac + HS 3. Start Novolog meal coverage- Novolog 6 units tid with meals (patient is eating 100% of meals)   Will follow Wyn Quaker RN, MSN, CDE Diabetes Coordinator Inpatient Diabetes Program Team Pager: [phone] (8a-10p)

## 2013-08-14 NOTE — Progress Notes (Addendum)
[facility] PROGRESS NOTE  Hannah [name] HUD:[id number] DOB: 26-Oct-1970 DOA: 08/13/2013 PCP: Vic Blackbird, MD  Brief narrative: 43 y.o. female with past medical history of diastolic CHF, CKD stage 4, uncontrolled diabetes, hypertension who presented to [facility] ED 08/13/2013 with worsening shortness of breath and lower extremity edema for past 2 weeks prior to this admission. No cough or fevers or chills or chest pain. She was on zaroxolyn due to worsening of shortness of breath but there was no significant symptomatic improvement.  In ED, vitals were as follows: BP was 97/50, HR 108 - 123, oxygen saturation was 94% on room air. Tmax was 98 F. Blood work revealed hemoglobin of 9.6, creatinine of 4.95 and slight elevation in troponin, 0.34. The 12 lead EKG showed sinus tachycardia. Renal and cardio were consulted. Per renal, no plan for HD yet as pt diuresing well with lasix.  Assessment and Plan:  Principal Problem:   Acute on chronic renal failure / CKD stage 4 - Baseline creatinine seems to be around 4.14 at least form 07/06/13. This admission creatinine is acutely elevated at 4.95. Patient has outpatient follow up in [place] [facility] for AVF placement in May. Per renal they do not recommend dialysis yet as pt diuresing well with current diuretic  - Continue lasix 80 mg IV every 8 hours  - Appreciate renal following  Active Problems:   Diabetes, uncontrolled with renal manifestations - A1c obtained 07/06/2013 was 9.6 indicating poor glycemic control - appreciate diabetic coordinator input - continue SSI and Levemir 45 units at bedtime; continue novolog 3 units TID   Acute diastolic CHF - BNP 85885 on this admission; last 2 D ECHO on file in 07/2012 showed EF 55% - appreciate cardio consult - continue lasix 80 mg IV Q 8 hours   Hypertension - continue metoprolol   Elevated troponin - likely demand ischemia due to renal insufficiency - troponin level: 0.34 --> 0.35 - the 12 lead  EKG showed sinus tachycardia; last 2 D ECHO on file in 07/2012 showed EF 55% - appreciate cardiology consult    Anemia of chronic kidney disease - hemoglobin stable at 9.6 and 10.5 - no current indications for transfusion    Hypokalemia - secondary to lasix - repleted with potassium BID, 20 meq   Consultants:  Nephrology  Cardiology   Procedures/Studies: Dg Chest Portable 1 View 08/13/2013  IMPRESSION: Bilateral pleural effusions with basilar atelectasis. There is mild pulmonary edema. Minimal change from the previous examination.     Antibiotics:  None   Code Status: Full Family Communication: Pt at bedside Disposition Plan: Home when medically stable  HPI/Subjective: No events overnight.   Objective: Filed Vitals:   08/14/13 0030 08/14/13 0105 08/14/13 0532 08/14/13 1021  BP: 145/74 103/47 135/76 136/85  Pulse: 108 117 108 110  Temp:  98 F (36.7 C) 97.7 F (36.5 C)   TempSrc:   Oral   Resp: 19 18 18    Height:  5\' 7"  (1.702 m)    Weight:  131.18 kg (289 lb 3.2 oz)    SpO2: 97% 95% 99%    No intake or output data in the 24 hours ending 08/14/13 1140  Exam:   General:  Pt is alert, follows commands appropriately, not in acute distress  Cardiovascular: tachycardic, S1/S2 appreciated   Respiratory: Clear to auscultation bilaterally, no wheezing, no crackles  Abdomen: Soft, non tender, non distended, bowel sounds present, no guarding  Extremities: +1 LE edema, pulses DP and PT palpable bilaterally  Neuro: Grossly nonfocal  Data Reviewed: Basic Metabolic Panel:  Recent Labs Lab 08/13/13 1949 08/14/13 0455  NA 143 141  K 3.9 3.2*  CL 106 103  CO2 21 20  GLUCOSE 202* 114*  BUN 70* 73*  CREATININE 4.95* 4.76*  CALCIUM 7.8* 7.9*   Liver Function Tests: No results found for this basename: AST, ALT, ALKPHOS, BILITOT, PROT, ALBUMIN,  in the last 168 hours No results found for this basename: LIPASE, AMYLASE,  in the last 168 hours No results found  for this basename: AMMONIA,  in the last 168 hours CBC:  Recent Labs Lab 08/13/13 1949 08/14/13 0455  WBC 8.7 8.3  HGB 9.6* 10.5*  HCT 29.6* 33.1*  MCV 92.8 94.0  PLT 343 328   Cardiac Enzymes:  Recent Labs Lab 08/14/13 0455 08/14/13 0745  TROPONINI 0.34* 0.35*   BNP: No components found with this basename: POCBNP,  CBG:  Recent Labs Lab 08/14/13 0722  GLUCAP 139*    No results found for this or any previous visit (from the past 240 hour(s)).   Scheduled Meds: . furosemide  80 mg Intravenous 3 times per day  . heparin  5,000 Units Subcutaneous 3 times per day  . insulin aspart  0-5 Units Subcutaneous QHS  . insulin aspart  0-9 Units Subcutaneous TID WC  . insulin aspart  3 Units Subcutaneous TID WC  . insulin detemir  45 Units Subcutaneous QPM  . metoprolol succinate  25 mg Oral Daily  . sodium chloride  3 mL Intravenous Q12H   Continuous Infusions:    Theodis Blaze, MD  [facility] Pager [phone]  If 7PM-7AM, please contact night-coverage [email or web address] Password [email or web address] 08/14/2013, 11:40 AM   LOS: 1 day

## 2013-08-14 NOTE — ED Notes (Signed)
Dr. Gardner at bedside 

## 2013-08-15 ENCOUNTER — Encounter: Payer: Self-pay | Admitting: *Deleted

## 2013-08-15 DIAGNOSIS — R799 Abnormal finding of blood chemistry, unspecified: Secondary | ICD-10-CM

## 2013-08-15 DIAGNOSIS — I10 Essential (primary) hypertension: Secondary | ICD-10-CM

## 2013-08-15 LAB — RENAL FUNCTION PANEL
Albumin: 1.6 g/dL — ABNORMAL LOW (ref 3.5–5.2)
BUN: 76 mg/dL — ABNORMAL HIGH (ref 6–23)
CO2: 21 mEq/L (ref 19–32)
Calcium: 7.8 mg/dL — ABNORMAL LOW (ref 8.4–10.5)
Chloride: 102 mEq/L (ref 96–112)
Creatinine, Ser: 4.82 mg/dL — ABNORMAL HIGH (ref 0.50–1.10)
GFR calc non Af Amer: 10 mL/min — ABNORMAL LOW (ref >90)
GFR, EST AFRICAN AMERICAN: 12 mL/min — AB (ref >90)
Glucose, Bld: 148 mg/dL — ABNORMAL HIGH (ref 70–99)
PHOSPHORUS: 7.2 mg/dL — AB (ref 2.3–4.6)
POTASSIUM: 3.4 meq/L — AB (ref 3.7–5.3)
SODIUM: 139 meq/L (ref 137–147)

## 2013-08-15 LAB — GLUCOSE, CAPILLARY
GLUCOSE-CAPILLARY: 93 mg/dL (ref 70–99)
GLUCOSE-CAPILLARY: 160 mg/dL — AB (ref 70–99)
GLUCOSE-CAPILLARY: 254 mg/dL — AB (ref 70–99)
Glucose-Capillary: 124 mg/dL — ABNORMAL HIGH (ref 70–99)

## 2013-08-15 LAB — CBC
HCT: 30.7 % — ABNORMAL LOW (ref 36.0–46.0)
Hemoglobin: 9.8 g/dL — ABNORMAL LOW (ref 12.0–15.0)
MCH: 30 pg (ref 26.0–34.0)
MCHC: 31.9 g/dL (ref 30.0–36.0)
MCV: 93.9 fL (ref 78.0–100.0)
PLATELETS: 320 10*3/uL (ref 150–400)
RBC: 3.27 MIL/uL — AB (ref 3.87–5.11)
RDW: 13.6 % (ref 11.5–15.5)
WBC: 6.8 10*3/uL (ref 4.0–10.5)

## 2013-08-15 NOTE — Progress Notes (Signed)
[facility] PROGRESS NOTE  Hannah [name] LKG:[id number] DOB: 1970-05-21 DOA: 08/13/2013 PCP: Vic Blackbird, MD  Brief narrative: 43 y.o. female with past medical history of diastolic CHF, CKD stage 4, uncontrolled diabetes, hypertension who presented to [facility] ED 08/13/2013 with worsening shortness of breath and lower extremity edema for past 2 weeks prior to this admission. No cough or fevers or chills or chest pain. She was on zaroxolyn due to worsening of shortness of breath but there was no significant symptomatic improvement.  In ED, vitals were as follows: BP was 97/50, HR 108 - 123, oxygen saturation was 94% on room air. Tmax was 98 F. Blood work revealed hemoglobin of 9.6, creatinine of 4.95 and slight elevation in troponin, 0.34. The 12 lead EKG showed sinus tachycardia. Renal and cardio were consulted. Per renal, no plan for HD yet as pt diuresing well with lasix.  Assessment and Plan:  Acute on chronic renal failure / CKD stage 4 - Baseline creatinine seems to be around 4.14 at least form 07/06/13. This admission creatinine is acutely elevated at 4.95. Patient has outpatient follow up in [place] [facility] for AVF placement in May. Per renal they do not recommend dialysis yet as pt diuresing well with current diuretic  - Continue lasix 80 mg IV every 8 hours  - Appreciate renal following     Diabetes, uncontrolled with renal manifestations - A1c obtained 07/06/2013 was 9.6 indicating poor glycemic control - appreciate diabetic coordinator input - continue SSI and Levemir 45 units at bedtime; continue novolog 3 units TID    Acute diastolic CHF - BNP 66440 on this admission; last 2 D ECHO on file in 07/2012 showed EF 55% - appreciate cardio consult - continue lasix 80 mg IV Q 8 hours    Hypertension - continue metoprolol   Elevated troponin - likely demand ischemia due to renal insufficiency - troponin level: 0.34 --> 0.35 - the 12 lead EKG showed sinus tachycardia;  last 2 D ECHO on file in 07/2012 showed EF 55% - appreciate cardiology consult, no further evaluation.     Anemia of chronic kidney disease - hemoglobin stable at 9.6 and 10.5 - no current indications for transfusion     Hypokalemia - secondary to lasix - repleted with potassium BID, 20 meq   Consultants:  Nephrology  Cardiology   Procedures/Studies: Dg Chest Portable 1 View 08/13/2013  IMPRESSION: Bilateral pleural effusions with basilar atelectasis. There is mild pulmonary edema. Minimal change from the previous examination.     Antibiotics:  None   Code Status: Full Family Communication: Pt at bedside Disposition Plan: Home when medically stable  HPI/Subjective: She is breathing better, no complaints.   Objective: Filed Vitals:   08/14/13 1021 08/14/13 2009 08/15/13 0451 08/15/13 1216  BP: 136/85 132/99 116/65 129/81  Pulse: 110 108 99 96  Temp: 98.1 F (36.7 C) 97.7 F (36.5 C) 97.9 F (36.6 C) 97.3 F (36.3 C)  TempSrc: Oral Axillary Oral Oral  Resp: 18   20  Height:      Weight:   129.91 kg (286 lb 6.4 oz)   SpO2: 96% 99% 93% 96%    Intake/Output Summary (Last 24 hours) at 08/15/13 1249 Last data filed at 08/15/13 0453  Gross per 24 hour  Intake    975 ml  Output   2300 ml  Net  -1325 ml    Exam:   General:  Pt is alert, follows commands appropriately, not in acute distress  Cardiovascular: tachycardic,  S1/S2 appreciated   Respiratory: Clear to auscultation bilaterally, no wheezing, no crackles  Abdomen: Soft, non tender, non distended, bowel sounds present, no guarding  Extremities: +1 LE edema, pulses DP and PT palpable bilaterally  Neuro: Grossly nonfocal  Data Reviewed: Basic Metabolic Panel:  Recent Labs Lab 08/13/13 1949 08/14/13 0455 08/15/13 0433  NA 143 141 139  K 3.9 3.2* 3.4*  CL 106 103 102  CO2 21 20 21   GLUCOSE 202* 114* 148*  BUN 70* 73* 76*  CREATININE 4.95* 4.76* 4.82*  CALCIUM 7.8* 7.9* 7.8*  PHOS  --   --   7.2*   Liver Function Tests:  Recent Labs Lab 08/15/13 0433  ALBUMIN 1.6*   No results found for this basename: LIPASE, AMYLASE,  in the last 168 hours No results found for this basename: AMMONIA,  in the last 168 hours CBC:  Recent Labs Lab 08/13/13 1949 08/14/13 0455 08/15/13 0433  WBC 8.7 8.3 6.8  HGB 9.6* 10.5* 9.8*  HCT 29.6* 33.1* 30.7*  MCV 92.8 94.0 93.9  PLT 343 328 320   Cardiac Enzymes:  Recent Labs Lab 08/14/13 0455 08/14/13 0745 08/14/13 1455  TROPONINI 0.34* 0.35* <0.30   BNP: No components found with this basename: POCBNP,  CBG:  Recent Labs Lab 08/14/13 1157 08/14/13 1740 08/14/13 2004 08/15/13 0747 08/15/13 1134  GLUCAP 185* 336* 244* 93 124*    No results found for this or any previous visit (from the past 240 hour(s)).   Scheduled Meds: . aspirin EC  81 mg Oral Daily  . furosemide  80 mg Intravenous 3 times per day  . heparin  5,000 Units Subcutaneous 3 times per day  . insulin aspart  0-5 Units Subcutaneous QHS  . insulin aspart  0-9 Units Subcutaneous TID WC  . insulin aspart  3 Units Subcutaneous TID WC  . insulin detemir  45 Units Subcutaneous QHS  . metoprolol succinate  25 mg Oral Daily  . potassium chloride  20 mEq Oral BID  . pravastatin  20 mg Oral q1800  . sodium chloride  3 mL Intravenous Q12H   Continuous Infusions:    Elmarie Shiley, MD  [facility] Pager [phone]  If 7PM-7AM, please contact night-coverage [email or web address] Password [email or web address] 08/15/2013, 12:49 PM   LOS: 2 days

## 2013-08-15 NOTE — Progress Notes (Signed)
[facility] ROUNDING NOTE   Subjective:   Interval History: feels better diuresing well  Objective:  Vital signs in last 24 hours:  Temp:  [97.7 F (36.5 C)-97.9 F (36.6 C)] 97.9 F (36.6 C) (04/29 0451) Pulse Rate:  [99-108] 99 (04/29 0451) BP: (116-132)/(65-99) 116/65 mmHg (04/29 0451) SpO2:  [93 %-99 %] 93 % (04/29 0451) Weight:  [129.91 kg (286 lb 6.4 oz)] 129.91 kg (286 lb 6.4 oz) (04/29 0451)  Weight change: -1.27 kg (-2 lb 12.8 oz) Filed Weights   08/14/13 0105 08/15/13 0451  Weight: 131.18 kg (289 lb 3.2 oz) 129.91 kg (286 lb 6.4 oz)    Intake/Output: I/O last 3 completed shifts: In: 975 [P.O.:975] Out: 2300 [Urine:2300]   Intake/Output this shift:     CVS- RRR RS-  diminished ABD- BS present soft non-distended EXT-  1 +   Basic Metabolic Panel:  Recent Labs Lab 08/13/13 1949 08/14/13 0455 08/15/13 0433  NA 143 141 139  K 3.9 3.2* 3.4*  CL 106 103 102  CO2 21 20 21   GLUCOSE 202* 114* 148*  BUN 70* 73* 76*  CREATININE 4.95* 4.76* 4.82*  CALCIUM 7.8* 7.9* 7.8*  PHOS  --   --  7.2*    Liver Function Tests:  Recent Labs Lab 08/15/13 0433  ALBUMIN 1.6*   No results found for this basename: LIPASE, AMYLASE,  in the last 168 hours No results found for this basename: AMMONIA,  in the last 168 hours  CBC:  Recent Labs Lab 08/13/13 1949 08/14/13 0455 08/15/13 0433  WBC 8.7 8.3 6.8  HGB 9.6* 10.5* 9.8*  HCT 29.6* 33.1* 30.7*  MCV 92.8 94.0 93.9  PLT 343 328 320    Cardiac Enzymes:  Recent Labs Lab 08/14/13 0455 08/14/13 0745 08/14/13 1455  TROPONINI 0.34* 0.35* <0.30    BNP: No components found with this basename: POCBNP,   CBG:  Recent Labs Lab 08/14/13 1157 08/14/13 1740 08/14/13 2004 08/15/13 0747 08/15/13 1134  GLUCAP 185* 336* 244* 93 124*    Microbiology: Results for orders placed during the hospital encounter of 05/05/13  URINE CULTURE     Status: None   Collection Time    05/05/13 11:41  AM      Result Value Ref Range Status   Specimen Description URINE, CLEAN CATCH   Final   Special Requests NONE   Final   Culture  Setup Time     Final   Value: 05/05/2013 22:12     Performed at [facility]     Final   Value: >=100,000 COLONIES/ML     Performed at [facility]   Culture     Final   Value: GROUP B STREP(S.AGALACTIAE)ISOLATED     Note: TESTING AGAINST S. AGALACTIAE NOT ROUTINELY PERFORMED DUE TO PREDICTABILITY OF AMP/PEN/VAN SUSCEPTIBILITY.     Performed at [facility]   Report Status 05/06/2013 FINAL   Final  CLOSTRIDIUM DIFFICILE BY PCR     Status: None   Collection Time    05/06/13 11:13 AM      Result Value Ref Range Status   C difficile by pcr NEGATIVE  NEGATIVE Final    Coagulation Studies: No results found for this basename: LABPROT, INR,  in the last 72 hours  Urinalysis: No results found for this basename: COLORURINE, APPERANCEUR, LABSPEC, PHURINE, GLUCOSEU, HGBUR, BILIRUBINUR, KETONESUR, PROTEINUR, UROBILINOGEN, NITRITE, LEUKOCYTESUR,  in the last 72 hours    Imaging: Dg Chest  Portable 1 View  08/13/2013   CLINICAL DATA:  Shortness of breath increasing for 2 weeks. History of CHF.  EXAM: PORTABLE CHEST - 1 VIEW  COMPARISON:  08/04/2013  FINDINGS: Again noted are bibasilar lung densities that are suggestive for pleural effusions and atelectasis. Heart size appears to be enlarged. There is some peribronchial thickening and cannot exclude pulmonary edema.  IMPRESSION: Bilateral pleural effusions with basilar atelectasis. There is mild pulmonary edema. Minimal change from the previous examination.   Electronically Signed   By: Markus Daft M.D.   On: 08/13/2013 23:10     Medications:     . aspirin EC  81 mg Oral Daily  . furosemide  80 mg Intravenous 3 times per day  . heparin  5,000 Units Subcutaneous 3 times per day  . insulin aspart  0-5 Units Subcutaneous QHS  . insulin aspart  0-9 Units Subcutaneous TID WC  .  insulin aspart  3 Units Subcutaneous TID WC  . insulin detemir  45 Units Subcutaneous QHS  . metoprolol succinate  25 mg Oral Daily  . potassium chloride  20 mEq Oral BID  . pravastatin  20 mg Oral q1800  . sodium chloride  3 mL Intravenous Q12H   acetaminophen, cyclobenzaprine, hydrOXYzine  Assessment/ Plan:  43 y.o. female who presents to the ED with worsening SOB, peripheral edema, for the past 2 weeks. This has been getting progressively worse with simultaneous weight gain of 10-20 lbs. There is orthopnea and exertional dyspnea, no cough nor fever, no chest pain. She is followed by Dr. Marval Regal for worsening CKD, recently discontinued zaroxolyn due to worsening of creatinine but had to restart this due to development of SOB and CHF symptoms. Now despite zaroxolyn she has SOB and CHF symptoms. She states that weight has not improved with zaroxolyn and urine output has not been brisk. Interestingly, she has only been taking 20mg  daily of torsemide  1. 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 HTN/Volume  Volume improved with diuresis 3. Anemia stable  4. Disposition Patient responding to diuretics would not dialyze at this point     LOS: 2 Sherril Croon @TODAY @11 :51 AM

## 2013-08-15 NOTE — Progress Notes (Signed)
SUBJECTIVE:  Feels better today  OBJECTIVE:   Vitals:   Filed Vitals:   08/14/13 0532 08/14/13 1021 08/14/13 2009 08/15/13 0451  BP: 135/76 136/85 132/99 116/65  Pulse: 108 110 108 99  Temp: 97.7 F (36.5 C) 98.1 F (36.7 C) 97.7 F (36.5 C) 97.9 F (36.6 C)  TempSrc: Oral Oral Axillary Oral  Resp: 18 18    Height:      Weight:    286 lb 6.4 oz (129.91 kg)  SpO2: 99% 96% 99% 93%   I&O's:   Intake/Output Summary (Last 24 hours) at 08/15/13 4709 Last data filed at 08/15/13 0453  Gross per 24 hour  Intake    975 ml  Output   2300 ml  Net  -1325 ml   TELEMETRY: Reviewed telemetry pt in NSR:     PHYSICAL EXAM General: Well developed, well nourished, in no acute distress Head: Eyes PERRLA, No xanthomas.   Normal cephalic and atramatic  Lungs: Few crackles at bases Heart:   HRRR S1 S2 Pulses are 2+ & equal. Abdomen: Bowel sounds are positive, abdomen soft and non-tender without masses Extremities:   2+ edema bilaterally Neuro: Alert and oriented X 3. Psych:  Good affect, responds appropriately   LABS: Basic Metabolic Panel:  Recent Labs  08/14/13 0455 08/15/13 0433  NA 141 139  K 3.2* 3.4*  CL 103 102  CO2 20 21  GLUCOSE 114* 148*  BUN 73* 76*  CREATININE 4.76* 4.82*  CALCIUM 7.9* 7.8*  PHOS  --  7.2*   Liver Function Tests:  Recent Labs  08/15/13 0433  ALBUMIN 1.6*   No results found for this basename: LIPASE, AMYLASE,  in the last 72 hours CBC:  Recent Labs  08/14/13 0455 08/15/13 0433  WBC 8.3 6.8  HGB 10.5* 9.8*  HCT 33.1* 30.7*  MCV 94.0 93.9  PLT 328 320   Cardiac Enzymes:  Recent Labs  08/14/13 0455 08/14/13 0745 08/14/13 1455  TROPONINI 0.34* 0.35* <0.30   BNP: No components found with this basename: POCBNP,  D-Dimer: No results found for this basename: DDIMER,  in the last 72 hours Hemoglobin A1C: No results found for this basename: HGBA1C,  in the last 72 hours Fasting Lipid Panel: No results found for this basename:  CHOL, HDL, LDLCALC, TRIG, CHOLHDL, LDLDIRECT,  in the last 72 hours Thyroid Function Tests: No results found for this basename: TSH, T4TOTAL, FREET3, T3FREE, THYROIDAB,  in the last 72 hours Anemia Panel: No results found for this basename: VITAMINB12, FOLATE, FERRITIN, TIBC, IRON, RETICCTPCT,  in the last 72 hours Coag Panel:   Lab Results  Component Value Date   INR 0.89 12/04/2012   INR 1.01 07/29/2011   INR 0.9 07/15/2011    RADIOLOGY: Dg Chest Portable 1 View  08/13/2013   CLINICAL DATA:  Shortness of breath increasing for 2 weeks. History of CHF.  EXAM: PORTABLE CHEST - 1 VIEW  COMPARISON:  08/04/2013  FINDINGS: Again noted are bibasilar lung densities that are suggestive for pleural effusions and atelectasis. Heart size appears to be enlarged. There is some peribronchial thickening and cannot exclude pulmonary edema.  IMPRESSION: Bilateral pleural effusions with basilar atelectasis. There is mild pulmonary edema. Minimal change from the previous examination.   Electronically Signed   By: Markus Daft M.D.   On: 08/13/2013 23:10   Dg Chest Portable 1 View  08/04/2013   CLINICAL DATA:  Shortness of breath.  Cough this morning.  EXAM: PORTABLE CHEST - 1 VIEW  COMPARISON:  05/06/2013  FINDINGS: Apical lordotic positioning. Midline trachea. Cardiomegaly accentuated by AP portable technique. Suspicion of small bilateral pleural effusions. No pneumothorax. Mild interstitial edema with thickening of the right minor fissure. Lung bases not well evaluated secondary to AP portable technique and patient body habitus. Suspicion of bibasilar left greater than right airspace disease.  IMPRESSION: 1. Mild congestive heart failure with bilateral pleural effusions. 2. Probable bibasilar atelectasis or infection. Suboptimally evaluated secondary to technique and patient size. Consider PA and lateral radiographs if possible.   Electronically Signed   By: Abigail Miyamoto M.D.   On: 08/04/2013 12:23      ASSESSMENT:    1. Elevated troponin - agree with Dr. Clayborne Dana assessment that there is minimal bump in the troponin with no significant trend upward and most likely represents acute CHF in setting of significant renal disease. EKG shows inferolateral ST abnormality which is old 2. A/C diastolic HF she is 2.1V negative thus far and weight has not decreased any since admit 3. Stage IV-V chronic renal failure  4. CAD, by cath 2013  5. DM2  PLAN:   1.  Continue Diuresis per renal 2.  Plan per renal for AVF in the near future 3.  Continue ASA/statin  Sueanne Margarita, MD  08/15/2013  8:28 AM

## 2013-08-15 NOTE — Progress Notes (Signed)
Inpatient Diabetes Program Recommendations  AACE/ADA: New Consensus Statement on Inpatient Glycemic Control (2013)  Target Ranges:  Prepandial:   less than 140 mg/dL      Peak postprandial:   less than 180 mg/dL (1-2 hours)      Critically ill patients:  140 - 180 mg/dL     Results for LAQUASIA, PINCUS (MRN [id number]) as of 08/15/2013 07:18  Ref. Range 08/14/2013 07:22 08/14/2013 11:57 08/14/2013 17:40 08/14/2013 20:04  Glucose-Capillary Latest Range: 70-99 mg/dL 139 (H) 185 (H) 336 (H) 244 (H)     Patient having postprandial glucose elevations.  Note that patient takes anywhere from 12-15 units of Humalog at home with meals.  Patient currently receiving the following insulin: -Levemir 45 units QHS (home dose) -Novolog Sensitive SSI tid ac + HS -Novolog 3 units tid with meals (Meal coverage)   MD- If patient continues to have issues with her postprandial glucose levels, please consider increasing scheduled Novolog meal coverage to 6 units tid with meals   Will follow Wyn Quaker RN, MSN, CDE Diabetes Coordinator Inpatient Diabetes Program Team Pager: [phone] (8a-10p)

## 2013-08-16 DIAGNOSIS — E1149 Type 2 diabetes mellitus with other diabetic neurological complication: Secondary | ICD-10-CM

## 2013-08-16 LAB — GLUCOSE, CAPILLARY
Glucose-Capillary: 57 mg/dL — ABNORMAL LOW (ref 70–99)
Glucose-Capillary: 71 mg/dL (ref 70–99)
Glucose-Capillary: 169 mg/dL — ABNORMAL HIGH (ref 70–99)
Glucose-Capillary: 126 mg/dL — ABNORMAL HIGH (ref 70–99)

## 2013-08-16 LAB — BASIC METABOLIC PANEL
BUN: 72 mg/dL — ABNORMAL HIGH (ref 6–23)
CALCIUM: 8.2 mg/dL — AB (ref 8.4–10.5)
CO2: 25 mEq/L (ref 19–32)
Chloride: 104 mEq/L (ref 96–112)
Creatinine, Ser: 4.81 mg/dL — ABNORMAL HIGH (ref 0.50–1.10)
GFR calc Af Amer: 12 mL/min — ABNORMAL LOW (ref >90)
GFR, EST NON AFRICAN AMERICAN: 10 mL/min — AB (ref >90)
Glucose, Bld: 60 mg/dL — ABNORMAL LOW (ref 70–99)
Potassium: 3.6 mEq/L — ABNORMAL LOW (ref 3.7–5.3)
SODIUM: 145 meq/L (ref 137–147)

## 2013-08-16 MED ORDER — INSULIN DETEMIR 100 UNIT/ML ~~LOC~~ SOLN
35.0000 [IU] | Freq: Every day | SUBCUTANEOUS | Status: DC
  Administered 2013-08-16: 35 [IU] via SUBCUTANEOUS
  Filled 2013-08-16 (×2): qty 0.35

## 2013-08-16 MED ORDER — TORSEMIDE 20 MG PO TABS
40.0000 mg | ORAL_TABLET | Freq: Two times a day (BID) | ORAL | Status: DC
  Administered 2013-08-16 – 2013-08-17 (×3): 40 mg via ORAL
  Filled 2013-08-16 (×4): qty 2

## 2013-08-16 NOTE — Progress Notes (Signed)
[name] ROUNDING NOTE   Subjective:   Interval History: diuresing well  Weight down  Objective:  Vital signs in last 24 hours:  Temp:  [97.3 F (36.3 C)-97.9 F (36.6 C)] 97.5 F (36.4 C) (04/30 0549) Pulse Rate:  [93-107] 107 (04/30 0559) Resp:  [20] 20 (04/30 0559) BP: (54-141)/(37-81) 141/79 mmHg (04/30 0605) SpO2:  [94 %-99 %] 96 % (04/30 0559) Weight:  [128.776 kg (283 lb 14.4 oz)] 128.776 kg (283 lb 14.4 oz) (04/30 0605)  Weight change: -1.134 kg (-2 lb 8 oz) Filed Weights   08/14/13 0105 08/15/13 0451 08/16/13 0605  Weight: 131.18 kg (289 lb 3.2 oz) 129.91 kg (286 lb 6.4 oz) 128.776 kg (283 lb 14.4 oz)    Intake/Output: I/O last 3 completed shifts: In: 1190 [P.O.:1190] Out: 4050 [Urine:4050]   Intake/Output this shift:     CVS- RRR  RS- diminished  ABD- BS present soft non-distended  EXT- 1 +    Basic Metabolic Panel:  Recent Labs Lab 08/13/13 1949 08/14/13 0455 08/15/13 0433 08/16/13 0618  NA 143 141 139 145  K 3.9 3.2* 3.4* 3.6*  CL 106 103 102 104  CO2 21 20 21 25   GLUCOSE 202* 114* 148* 60*  BUN 70* 73* 76* 72*  CREATININE 4.95* 4.76* 4.82* 4.81*  CALCIUM 7.8* 7.9* 7.8* 8.2*  PHOS  --   --  7.2*  --     Liver Function Tests:  Recent Labs Lab 08/15/13 0433  ALBUMIN 1.6*   No results found for this basename: LIPASE, AMYLASE,  in the last 168 hours No results found for this basename: AMMONIA,  in the last 168 hours  CBC:  Recent Labs Lab 08/13/13 1949 08/14/13 0455 08/15/13 0433  WBC 8.7 8.3 6.8  HGB 9.6* 10.5* 9.8*  HCT 29.6* 33.1* 30.7*  MCV 92.8 94.0 93.9  PLT 343 328 320    Cardiac Enzymes:  Recent Labs Lab 08/14/13 0455 08/14/13 0745 08/14/13 1455  TROPONINI 0.34* 0.35* <0.30    BNP: No components found with this basename: POCBNP,   CBG:  Recent Labs Lab 08/15/13 1134 08/15/13 1621 08/15/13 2011 08/16/13 0738 08/16/13 0810  GLUCAP 124* 160* 254* 57* 71    Microbiology: Results  for orders placed during the hospital encounter of 05/05/13  URINE CULTURE     Status: None   Collection Time    05/05/13 11:41 AM      Result Value Ref Range Status   Specimen Description URINE, CLEAN CATCH   Final   Special Requests NONE   Final   Culture  Setup Time     Final   Value: 05/05/2013 22:12     Performed at [facility]     Final   Value: >=100,000 COLONIES/ML     Performed at [facility]   Culture     Final   Value: GROUP B STREP(S.AGALACTIAE)ISOLATED     Note: TESTING AGAINST S. AGALACTIAE NOT ROUTINELY PERFORMED DUE TO PREDICTABILITY OF AMP/PEN/VAN SUSCEPTIBILITY.     Performed at [facility]   Report Status 05/06/2013 FINAL   Final  CLOSTRIDIUM DIFFICILE BY PCR     Status: None   Collection Time    05/06/13 11:13 AM      Result Value Ref Range Status   C difficile by pcr NEGATIVE  NEGATIVE Final    Coagulation Studies: No results found for this basename: LABPROT, INR,  in the last 72 hours  Urinalysis:  No results found for this basename: COLORURINE, APPERANCEUR, LABSPEC, [place], GLUCOSEU, HGBUR, BILIRUBINUR, KETONESUR, PROTEINUR, UROBILINOGEN, NITRITE, LEUKOCYTESUR,  in the last 72 hours    Imaging: No results found.   Medications:     . aspirin EC  81 mg Oral Daily  . heparin  5,000 Units Subcutaneous 3 times per day  . insulin aspart  0-5 Units Subcutaneous QHS  . insulin aspart  0-9 Units Subcutaneous TID WC  . insulin aspart  3 Units Subcutaneous TID WC  . insulin detemir  45 Units Subcutaneous QHS  . metoprolol succinate  25 mg Oral Daily  . potassium chloride  20 mEq Oral BID  . pravastatin  20 mg Oral q1800  . sodium chloride  3 mL Intravenous Q12H  . torsemide  40 mg Oral BID   acetaminophen, cyclobenzaprine, hydrOXYzine  Assessment/ Plan:  43 y.o. female who presents to the ED with worsening SOB, peripheral edema, for the past 2 weeks. This has been getting progressively worse with simultaneous  weight gain of 10-20 lbs. There is orthopnea and exertional dyspnea, no cough nor fever, no chest pain. She is followed by Dr. Marval Regal for worsening CKD, recently discontinued zaroxolyn due to worsening of creatinine but had to restart this due to development of SOB and CHF symptoms. Now despite zaroxolyn she has SOB and CHF symptoms. She states that weight has not improved with zaroxolyn and urine output has not been brisk. Interestingly, she has only been taking 20mg  daily of torsemide  1. 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 HTN/Volume Volume improved with diuresis  3. Anemia stable  4. Disposition Patient responding to diuretics would not dialyze at this point, I have therefore changed to oral diuretics      LOS: 3 [facility] [name] @TODAY @10 :52 AM

## 2013-08-16 NOTE — Progress Notes (Signed)
Subjective: Breathing better.  Objective: Vital signs in last 24 hours: Temp:  [97.3 F (36.3 C)-97.9 F (36.6 C)] 97.5 F (36.4 C) (04/30 0549) Pulse Rate:  [93-107] 107 (04/30 0559) Resp:  [20] 20 (04/30 0559) BP: (54-141)/(37-81) 141/79 mmHg (04/30 0605) SpO2:  [94 %-99 %] 96 % (04/30 0559) Weight:  [283 lb 14.4 oz (128.776 kg)] 283 lb 14.4 oz (128.776 kg) (04/30 0605) Last BM Date: 08/14/13  Intake/Output from previous day: 04/29 0701 - 04/30 0700 In: 570 [P.O.:570] Out: 2100 [Urine:2100] Intake/Output this shift:    Medications Current Facility-Administered Medications  Medication Dose Route Frequency Provider Last Rate Last Dose  . acetaminophen (TYLENOL) tablet 500 mg  500 mg Oral Q8H PRN Etta Quill, DO      . aspirin EC tablet 81 mg  81 mg Oral Daily Jolaine Artist, MD   81 mg at 08/16/13 1018  . cyclobenzaprine (FLEXERIL) tablet 5-10 mg  5-10 mg Oral TID PRN Etta Quill, DO   5 mg at 08/14/13 2005  . heparin injection 5,000 Units  5,000 Units Subcutaneous 3 times per day Etta Quill, DO   5,000 Units at 08/16/13 0607  . hydrOXYzine (ATARAX/VISTARIL) tablet 25 mg  25 mg Oral TID PRN Etta Quill, DO      . insulin aspart (novoLOG) injection 0-5 Units  0-5 Units Subcutaneous QHS Theodis Blaze, MD   3 Units at 08/15/13 2119  . insulin aspart (novoLOG) injection 0-9 Units  0-9 Units Subcutaneous TID WC Theodis Blaze, MD   2 Units at 08/15/13 1803  . insulin aspart (novoLOG) injection 3 Units  3 Units Subcutaneous TID WC Theodis Blaze, MD   3 Units at 08/15/13 1803  . insulin detemir (LEVEMIR) injection 45 Units  45 Units Subcutaneous QHS Theodis Blaze, MD   45 Units at 08/15/13 2119  . metoprolol succinate (TOPROL-XL) 24 hr tablet 25 mg  25 mg Oral Daily Etta Quill, DO   25 mg at 08/16/13 1018  . potassium chloride SA (K-DUR,KLOR-CON) CR tablet 20 mEq  20 mEq Oral BID Theodis Blaze, MD   20 mEq at 08/16/13 1018  . pravastatin (PRAVACHOL) tablet  20 mg  20 mg Oral q1800 Theodis Blaze, MD   20 mg at 08/15/13 1803  . sodium chloride 0.9 % injection 3 mL  3 mL Intravenous Q12H Etta Quill, DO   3 mL at 08/16/13 1018  . torsemide (DEMADEX) tablet 40 mg  40 mg Oral BID Sherril Croon, MD   40 mg at 08/16/13 1018    PE: General appearance: alert, cooperative and no distress Lungs: clear to auscultation bilaterally Heart: regular rate and rhythm, S1, S2 normal, no murmur, click, rub or gallop Extremities: 2+ LEE Pulses: 2+ and symmetric Skin: Warm and dry Neurologic: Grossly normal  Lab Results:   Recent Labs  08/13/13 1949 08/14/13 0455 08/15/13 0433  WBC 8.7 8.3 6.8  HGB 9.6* 10.5* 9.8*  HCT 29.6* 33.1* 30.7*  PLT 343 328 320   BMET  Recent Labs  08/14/13 0455 08/15/13 0433 08/16/13 0618  NA 141 139 145  K 3.2* 3.4* 3.6*  CL 103 102 104  CO2 20 21 25   GLUCOSE 114* 148* 60*  BUN 73* 76* 72*  CREATININE 4.76* 4.82* 4.81*  CALCIUM 7.9* 7.8* 8.2*   PT/INR No results found for this basename: LABPROT, INR,  in the last 72 hours Cholesterol No results found  for this basename: CHOL,  in the last 72 hours Cardiac Enzymes No components found with this basename: TROPONIN,  [facility],   Studies/Results: @RISRSLT2 @   Assessment/Plan  Principal Problem:   Acute on chronic renal failure  Active Problems:   Elevated troponin Troponin 0.35.   Related to CHF.      Acute on chronic diastolic heart failure Net fluids:  -1.53/-2.9L.   On PO torsemide 40mg  BID as on today.    EF 55-60%, no wall motion abnormality.  SCr elevated but stable.  She still has quite a bit of LEE.      Diabetic retinopathy associated with type 2 diabetes mellitus   Nephrotic syndrome   Coronary atherosclerosis of native coronary artery  No angina   LOS: 3 days    Tarri Fuller PA-C 08/16/2013 10:19 AM

## 2013-08-16 NOTE — Progress Notes (Signed)
[facility] PROGRESS NOTE  Hannah [name] PPJ:[id number] DOB: [birth year] DOA: 08/13/2013 PCP: Vic Blackbird, MD  Brief narrative: 43 y.o. female with past medical history of diastolic CHF, CKD stage 4, uncontrolled diabetes, hypertension who presented to [facility] ED 08/13/2013 with worsening shortness of breath and lower extremity edema for past 2 weeks prior to this admission. No cough or fevers or chills or chest pain. She was on zaroxolyn due to worsening of shortness of breath but there was no significant symptomatic improvement.  In ED, vitals were as follows: BP was 97/50, HR 108 - 123, oxygen saturation was 94% on room air. Tmax was 98 F. Blood work revealed hemoglobin of 9.6, creatinine of 4.95 and slight elevation in troponin, 0.34. The 12 lead EKG showed sinus tachycardia. Renal and cardio were consulted. Per renal, no plan for HD yet as pt diuresing well with lasix.  Assessment and Plan:  Acute on chronic renal failure / CKD stage 4 - Baseline creatinine seems to be around 4.14 at least form 07/06/13. This admission creatinine is acutely elevated at 4.95. Patient has outpatient follow up in [place] [facility] for AVF placement in May. Per renal they do not recommend dialysis yet as pt diuresing well with current diuretic  - lasix 80 mg IV every 8 hours was change to Demadex 40 mg BIB on 4-30.  - Appreciate renal evaluation.     Diabetes, uncontrolled with renal manifestations - A1c obtained 07/06/2013 was 9.6 indicating poor glycemic control - appreciate diabetic coordinator input - continue SSI and -Decrease  Levemir to 35 units at bedtime due to hypoglycemic episode. ; continue novolog 3 units TID    Acute diastolic CHF - BNP 58099 on this admission; last 2 D ECHO on file in 07/2012 showed EF 55% - appreciate cardio consult - now on Demadex.     Hypertension - continue metoprolol   Elevated troponin - likely demand ischemia due to renal insufficiency - troponin level:  0.34 --> 0.35 - the 12 lead EKG showed sinus tachycardia; last 2 D ECHO on file in 07/2012 showed EF 55% - appreciate cardiology consult, no further evaluation.     Anemia of chronic kidney disease - hemoglobin stable at 9.6 and 10.5 - no current indications for transfusion     Hypokalemia - secondary to lasix - repleted with potassium BID, 20 meq   Consultants:  Nephrology  Cardiology   Procedures/Studies: Dg Chest Portable 1 View 08/13/2013  IMPRESSION: Bilateral pleural effusions with basilar atelectasis. There is mild pulmonary edema. Minimal change from the previous examination.     Antibiotics:  None   Code Status: Full Family Communication: Pt at bedside Disposition Plan: Home when medically stable  HPI/Subjective: She is breathing better, no complaints.   Objective: Filed Vitals:   08/16/13 0556 08/16/13 0559 08/16/13 0605 08/16/13 1400  BP: 89/46 54/37 141/79 119/97  Pulse: 103 107  99  Temp:    98 F (36.7 C)  TempSrc:    Oral  Resp: 20 20  18   Height:      Weight:   128.776 kg (283 lb 14.4 oz)   SpO2: 99% 96%  99%    Intake/Output Summary (Last 24 hours) at 08/16/13 1808 Last data filed at 08/16/13 0610  Gross per 24 hour  Intake    570 ml  Output   2100 ml  Net  -1530 ml    Exam:   General:  Pt is alert, follows commands appropriately, not in acute distress  Cardiovascular: tachycardic, S1/S2 appreciated   Respiratory: Clear to auscultation bilaterally, no wheezing, no crackles  Abdomen: Soft, non tender, non distended, bowel sounds present, no guarding  Extremities: +1 LE edema, pulses DP and PT palpable bilaterally  Neuro: Grossly nonfocal  Data Reviewed: Basic Metabolic Panel:  Recent Labs Lab 08/13/13 1949 08/14/13 0455 08/15/13 0433 08/16/13 0618  NA 143 141 139 145  K 3.9 3.2* 3.4* 3.6*  CL 106 103 102 104  CO2 21 20 21 25   GLUCOSE 202* 114* 148* 60*  BUN 70* 73* 76* 72*  CREATININE 4.95* 4.76* 4.82* 4.81*   CALCIUM 7.8* 7.9* 7.8* 8.2*  PHOS  --   --  7.2*  --    Liver Function Tests:  Recent Labs Lab 08/15/13 0433  ALBUMIN 1.6*   No results found for this basename: LIPASE, AMYLASE,  in the last 168 hours No results found for this basename: AMMONIA,  in the last 168 hours CBC:  Recent Labs Lab 08/13/13 1949 08/14/13 0455 08/15/13 0433  WBC 8.7 8.3 6.8  HGB 9.6* 10.5* 9.8*  HCT 29.6* 33.1* 30.7*  MCV 92.8 94.0 93.9  PLT 343 328 320   Cardiac Enzymes:  Recent Labs Lab 08/14/13 0455 08/14/13 0745 08/14/13 1455  TROPONINI 0.34* 0.35* <0.30   BNP: No components found with this basename: POCBNP,  CBG:  Recent Labs Lab 08/15/13 2011 08/16/13 0738 08/16/13 0810 08/16/13 1138 08/16/13 1628  GLUCAP 254* 57* 71 169* 126*    No results found for this or any previous visit (from the past 240 hour(s)).   Scheduled Meds: . aspirin EC  81 mg Oral Daily  . heparin  5,000 Units Subcutaneous 3 times per day  . insulin aspart  0-9 Units Subcutaneous TID WC  . insulin aspart  3 Units Subcutaneous TID WC  . insulin detemir  35 Units Subcutaneous QHS  . metoprolol succinate  25 mg Oral Daily  . potassium chloride  20 mEq Oral BID  . pravastatin  20 mg Oral q1800  . sodium chloride  3 mL Intravenous Q12H  . torsemide  40 mg Oral BID   Continuous Infusions:    Elmarie Shiley, MD  [facility] Pager [phone]  If 7PM-7AM, please contact night-coverage [email or web address] Password [email or web address] 08/16/2013, 6:08 PM   LOS: 3 days

## 2013-08-16 NOTE — Progress Notes (Signed)
Patient seen and examined and agree with assessment and plan as outlined by Tarri Fuller PA-C.  Diuresing on PO Demadex.  Continue beta blocker.

## 2013-08-17 ENCOUNTER — Other Ambulatory Visit: Payer: Self-pay | Admitting: Physician Assistant

## 2013-08-17 ENCOUNTER — Ambulatory Visit: Payer: Medicare Other | Admitting: Endocrinology

## 2013-08-17 DIAGNOSIS — R778 Other specified abnormalities of plasma proteins: Secondary | ICD-10-CM

## 2013-08-17 DIAGNOSIS — Z0289 Encounter for other administrative examinations: Secondary | ICD-10-CM

## 2013-08-17 DIAGNOSIS — R7989 Other specified abnormal findings of blood chemistry: Principal | ICD-10-CM

## 2013-08-17 LAB — RENAL FUNCTION PANEL
ALBUMIN: 1.6 g/dL — AB (ref 3.5–5.2)
BUN: 69 mg/dL — ABNORMAL HIGH (ref 6–23)
CALCIUM: 7.9 mg/dL — AB (ref 8.4–10.5)
CHLORIDE: 101 meq/L (ref 96–112)
CO2: 24 mEq/L (ref 19–32)
CREATININE: 4.63 mg/dL — AB (ref 0.50–1.10)
GFR calc Af Amer: 12 mL/min — ABNORMAL LOW (ref >90)
GFR calc non Af Amer: 11 mL/min — ABNORMAL LOW (ref >90)
Glucose, Bld: 158 mg/dL — ABNORMAL HIGH (ref 70–99)
Phosphorus: 6.5 mg/dL — ABNORMAL HIGH (ref 2.3–4.6)
Potassium: 3.3 mEq/L — ABNORMAL LOW (ref 3.7–5.3)
Sodium: 139 mEq/L (ref 137–147)

## 2013-08-17 LAB — GLUCOSE, CAPILLARY
Glucose-Capillary: 247 mg/dL — ABNORMAL HIGH (ref 70–99)
Glucose-Capillary: 120 mg/dL — ABNORMAL HIGH (ref 70–99)
Glucose-Capillary: 165 mg/dL — ABNORMAL HIGH (ref 70–99)

## 2013-08-17 MED ORDER — PRAVASTATIN SODIUM 20 MG PO TABS: 20.0000 mg | ORAL_TABLET | Freq: Every day | ORAL | Status: DC

## 2013-08-17 MED ORDER — POTASSIUM CHLORIDE CRYS ER 20 MEQ PO TBCR: 20.0000 meq | EXTENDED_RELEASE_TABLET | Freq: Two times a day (BID) | ORAL | Status: DC

## 2013-08-17 MED ORDER — METOLAZONE 5 MG PO TABS: 5.0000 mg | ORAL_TABLET | Freq: Every day | ORAL | Status: DC | PRN

## 2013-08-17 MED ORDER — INSULIN LISPRO 100 UNIT/ML (KWIKPEN): 3.0000 [IU] | PEN_INJECTOR | Freq: Three times a day (TID) | SUBCUTANEOUS | Status: DC

## 2013-08-17 MED ORDER — ASPIRIN 81 MG PO TBEC: 81.0000 mg | DELAYED_RELEASE_TABLET | Freq: Every day | ORAL | Status: AC

## 2013-08-17 MED ORDER — TORSEMIDE 20 MG PO TABS: 40.0000 mg | ORAL_TABLET | Freq: Two times a day (BID) | ORAL | Status: DC

## 2013-08-17 MED ORDER — INSULIN DETEMIR 100 UNIT/ML FLEXPEN: 34.0000 [IU] | PEN_INJECTOR | Freq: Every evening | SUBCUTANEOUS | Status: DC

## 2013-08-17 NOTE — Discharge Summary (Signed)
Physician Discharge Summary  Hannah [name]:[id number] DOB: 10/18/1970 DOA: 08/13/2013  PCP: Vic Blackbird, MD  Admit date: 08/13/2013 Discharge date: 08/17/2013  Time spent: 35 minutes  Recommendations for Outpatient Follow-up:  1. Needs to follow up with primary nephrologist for lab, renal function.   Discharge Diagnoses:    Acute on chronic renal failure   Diastolic CHF, acute   Diabetic retinopathy associated with type 2 diabetes mellitus   Nephrotic syndrome   Acute on chronic diastolic heart failure   Elevated troponin   Coronary atherosclerosis of native coronary artery   Discharge Condition: stable.   Diet recommendation: renal diet.   Filed Weights   08/15/13 0451 08/16/13 0605 08/17/13 0512  Weight: 129.91 kg (286 lb 6.4 oz) 128.776 kg (283 lb 14.4 oz) 127.053 kg (280 lb 1.6 oz)    History of present illness:  Hannah [name] is a 43 y.o. female who presents to the ED with worsening SOB, peripheral edema, for the past 2 weeks. This has been getting progressively worse. There is orthopnea and exertional dyspnea, no cough nor fever, no chest pain. She is followed by Dr. Marval Regal for worsening CKD, recently discontinued zaroxolyn due to worsening of creatinine but had to restart this due to development of SOB and CHF symptoms. Now despite zaroxolyn she has SOB and CHF symptoms.   Hospital Course:  Brief narrative:  43 y.o. female with past medical history of diastolic CHF, CKD stage 4, uncontrolled diabetes, hypertension who presented to [facility] ED 08/13/2013 with worsening shortness of breath and lower extremity edema for past 2 weeks prior to this admission. No cough or fevers or chills or chest pain. She was on zaroxolyn due to worsening of shortness of breath but there was no significant symptomatic improvement.  In ED, vitals were as follows: BP was 97/50, HR 108 - 123, oxygen saturation was 94% on room air. Tmax was 98 F. Blood work revealed hemoglobin of 9.6,  creatinine of 4.95 and slight elevation in troponin, 0.34. The 12 lead EKG showed sinus tachycardia. Renal and cardio were consulted. Per renal, no plan for HD yet as pt diuresing well with lasix.   Assessment and Plan:  Acute on chronic renal failure / CKD stage 4  - Baseline creatinine seems to be around 4.14 at least form 07/06/13. This admission creatinine is acutely elevated at 4.95. Patient has outpatient follow up in [place] [facility] for AVF placement in May. Per renal they do not recommend dialysis yet as pt diuresing well with current diuretic  - lasix 80 mg IV every 8 hours was change to Demadex 40 mg BIB on 4-30.  - Appreciate renal evaluation.  -Patient with good diuresis. Plan to discharge on Demadex 40 mg BID. Metolazone 5 mg daily PRN for 3 pounds weight gain in 24 hour period.   Diabetes, uncontrolled with renal manifestations  - A1c obtained 07/06/2013 was 9.6 indicating poor glycemic control  - appreciate diabetic coordinator input  - continue SSI and  -Decrease Levemir to 34 units at bedtime due to hypoglycemic episode. ; continue novolog 3 units TID   Acute diastolic CHF  - BNP 63149 on this admission; last 2 D ECHO on file in 07/2012 showed EF 55%  - appreciate cardio consult  - now on Demadex.   Hypertension  - continue metoprolol   Elevated troponin  - likely demand ischemia due to renal insufficiency  - troponin level: 0.34 --> 0.35  - the 12 lead EKG showed sinus  tachycardia; last 2 D ECHO on file in 07/2012 showed EF 55%  - appreciate cardiology consult, no further evaluation.   Anemia of chronic kidney disease  - hemoglobin stable at 9.6 and 10.5  - no current indications for transfusion   Hypokalemia  - secondary to lasix  - repleted with potassium BID, 20 meq      Procedures:  none  Consultations:  Dr Web  Dr Radford Pax.   Discharge Exam: Filed Vitals:   08/17/13 1011  BP: 130/68  Pulse: 99  Temp:   Resp:     General: no  distress.  Cardiovascular: S 1, S 2 RRR Respiratory: CTA  Discharge Instructions You were cared for by a hospitalist during your hospital stay. If you have any questions about your discharge medications or the care you received while you were in the hospital after you are discharged, you can call the unit and asked to speak with the hospitalist on call if the hospitalist that took care of you is not available. Once you are discharged, your primary care physician will handle any further medical issues. Please note that NO REFILLS for any discharge medications will be authorized once you are discharged, as it is imperative that you return to your primary care physician (or establish a relationship with a primary care physician if you do not have one) for your aftercare needs so that they can reassess your need for medications and monitor your lab values.      Discharge Orders   Future Appointments Provider Department Dept Phone   08/22/2013 9:30 AM [facility] [place] [facility] AT [facility] [phone]   Liquids only 4 hours prior to your exam. Any medications can be taken as usual. Please arrive 15 min prior to your scheduled exam time.   08/22/2013 11:00 AM Ivin Poot, MD [facility] [phone]   08/27/2013 2:30 PM [facility] [place] [facility] [address] [phone]   08/27/2013 3:00 PM [facility] [place] [facility] [address] [phone]   08/27/2013 3:45 PM Serafina Mitchell, MD [facility] [phone]   09/03/2013 7:30 AM [facility] 2 (Nuc Treadm) [place] [phone]   10/08/2013 10:15 AM Alycia Rossetti, MD [place] [facility] [phone]   Future Orders Complete By Expires   Diet - low sodium heart healthy  As directed    Increase activity slowly  As directed        Medication List         acetaminophen 500 MG tablet   Commonly known as:  TYLENOL  Take 500 mg by mouth every 8 (eight) hours as needed for mild pain.     aspirin 81 MG EC tablet  Take 1 tablet (81 mg total) by mouth daily.     cholecalciferol 1000 UNITS tablet  Commonly known as:  VITAMIN D  Take 1,000 Units by mouth daily.     cyclobenzaprine 10 MG tablet  Commonly known as:  FLEXERIL  Take 5-10 mg by mouth 3 (three) times daily as needed for muscle spasms.     hydrOXYzine 25 MG tablet  Commonly known as:  ATARAX/VISTARIL  Take 1 tablet (25 mg total) by mouth 3 (three) times daily as needed.     Insulin Detemir 100 UNIT/ML Pen  Commonly known as:  LEVEMIR FLEXPEN  Inject 34 Units into the skin every evening.     insulin lispro 100 UNIT/ML  KiwkPen  Commonly known as:  HUMALOG KWIKPEN  Inject 0.03 mLs (3 Units total) into the skin 3 (three) times daily with meals.     methylcellulose 1 % ophthalmic solution  Commonly known as:  ARTIFICIAL TEARS  Place 1 drop into both eyes at bedtime as needed (dry eyes).     metolazone 5 MG tablet  Commonly known as:  ZAROXOLYN  Take 1 tablet (5 mg total) by mouth daily as needed.     metoprolol succinate 25 MG 24 hr tablet  Commonly known as:  TOPROL-XL  Take 1 tablet (25 mg total) by mouth daily.     potassium chloride SA 20 MEQ tablet  Commonly known as:  K-DUR,KLOR-CON  Take 1 tablet (20 mEq total) by mouth 2 (two) times daily.     pravastatin 20 MG tablet  Commonly known as:  PRAVACHOL  Take 1 tablet (20 mg total) by mouth daily at 6 PM.     torsemide 20 MG tablet  Commonly known as:  DEMADEX  Take 2 tablets (40 mg total) by mouth 2 (two) times daily.       Allergies  Allergen Reactions  . Crestor [Rosuvastatin] Other (See Comments)    Severe muscle weakness  . Nsaids     Not allergic, "bad on my kidneys"  . Ciprofloxacin Rash   Follow-up Information   Follow up with [place]. (Office will call you to schedule your stress test and follow-up  appointment. Please call the office if you have not heard from them within 3 days. )    Specialty:  Cardiology   Contact information:   [address], [address] [place] [address] [phone]      Follow up with COLADONATO,JOSEPH A, MD In 1 week.   Specialty:  Nephrology   Contact information:   [place] [address] [phone]        The results of significant diagnostics from this hospitalization (including imaging, microbiology, ancillary and laboratory) are listed below for reference.    Significant Diagnostic Studies: Dg Chest Portable 1 View  08/13/2013   CLINICAL DATA:  Shortness of breath increasing for 2 weeks. History of CHF.  EXAM: PORTABLE CHEST - 1 VIEW  COMPARISON:  08/04/2013  FINDINGS: Again noted are bibasilar lung densities that are suggestive for pleural effusions and atelectasis. Heart size appears to be enlarged. There is some peribronchial thickening and cannot exclude pulmonary edema.  IMPRESSION: Bilateral pleural effusions with basilar atelectasis. There is mild pulmonary edema. Minimal change from the previous examination.   Electronically Signed   By: Markus Daft M.D.   On: 08/13/2013 23:10   Dg Chest Portable 1 View  08/04/2013   CLINICAL DATA:  Shortness of breath.  Cough this morning.  EXAM: PORTABLE CHEST - 1 VIEW  COMPARISON:  05/06/2013  FINDINGS: Apical lordotic positioning. Midline trachea. Cardiomegaly accentuated by AP portable technique. Suspicion of small bilateral pleural effusions. No pneumothorax. Mild interstitial edema with thickening of the right minor fissure. Lung bases not well evaluated secondary to AP portable technique and patient body habitus. Suspicion of bibasilar left greater than right airspace disease.  IMPRESSION: 1. Mild congestive heart failure with bilateral pleural effusions. 2. Probable bibasilar atelectasis or infection. Suboptimally evaluated secondary to technique and patient  size. Consider PA and lateral radiographs if possible.   Electronically Signed   By: Abigail Miyamoto M.D.   On: 08/04/2013 12:23    Microbiology: No results  found for this or any previous visit (from the past 240 hour(s)).   Labs: Basic Metabolic Panel:  Recent Labs Lab 08/13/13 1949 08/14/13 0455 08/15/13 0433 08/16/13 0618 08/17/13 0505  NA 143 141 139 145 139  K 3.9 3.2* 3.4* 3.6* 3.3*  CL 106 103 102 104 101  CO2 21 20 21 25 24   GLUCOSE 202* 114* 148* 60* 158*  BUN 70* 73* 76* 72* 69*  CREATININE 4.95* 4.76* 4.82* 4.81* 4.63*  CALCIUM 7.8* 7.9* 7.8* 8.2* 7.9*  PHOS  --   --  7.2*  --  6.5*   Liver Function Tests:  Recent Labs Lab 08/15/13 0433 08/17/13 0505  ALBUMIN 1.6* 1.6*   No results found for this basename: LIPASE, AMYLASE,  in the last 168 hours No results found for this basename: AMMONIA,  in the last 168 hours CBC:  Recent Labs Lab 08/13/13 1949 08/14/13 0455 08/15/13 0433  WBC 8.7 8.3 6.8  HGB 9.6* 10.5* 9.8*  HCT 29.6* 33.1* 30.7*  MCV 92.8 94.0 93.9  PLT 343 328 320   Cardiac Enzymes:  Recent Labs Lab 08/14/13 0455 08/14/13 0745 08/14/13 1455  TROPONINI 0.34* 0.35* <0.30   BNP: BNP (last 3 results)  Recent Labs  08/13/13 1949  PROBNP 34187.0*   CBG:  Recent Labs Lab 08/16/13 1138 08/16/13 1628 08/16/13 2143 08/17/13 0755 08/17/13 1256  GLUCAP 169* 126* 247* 120* 165*       Signed:  Belkys A Regalado  [facility] 08/17/2013, 2:05 PM

## 2013-08-17 NOTE — Progress Notes (Signed)
SUBJECTIVE:  No complaints - wants to go home  OBJECTIVE:   Vitals:   Filed Vitals:   08/16/13 0605 08/16/13 1400 08/16/13 2100 08/17/13 0512  BP: 141/79 119/97 136/76 115/68  Pulse:  99 100 101  Temp:  98 F (36.7 C) 97.9 F (36.6 C) 97.7 F (36.5 C)  TempSrc:  Oral Oral Oral  Resp:  18 20 20   Height:      Weight: 283 lb 14.4 oz (128.776 kg)   280 lb 1.6 oz (127.053 kg)  SpO2:  99% 99% 98%   I&O's:   Intake/Output Summary (Last 24 hours) at 08/17/13 0943 Last data filed at 08/17/13 0900  Gross per 24 hour  Intake    700 ml  Output   4550 ml  Net  -3850 ml   TELEMETRY: Reviewed telemetry pt in NSR:     PHYSICAL EXAM General: Well developed, well nourished, in no acute distress Head: Eyes PERRLA, No xanthomas.   Normal cephalic and atramatic  Lungs:   Clear bilaterally to auscultation and percussion. Heart:   HRRR S1 S2 Pulses are 2+ & equal. Abdomen: Bowel sounds are positive, abdomen soft and non-tender without masses Extremities:   1+ edema Neuro: Alert and oriented X 3. Psych:  Good affect, responds appropriately   LABS: Basic Metabolic Panel:  Recent Labs  08/15/13 0433 08/16/13 0618 08/17/13 0505  NA 139 145 139  K 3.4* 3.6* 3.3*  CL 102 104 101  CO2 21 25 24   GLUCOSE 148* 60* 158*  BUN 76* 72* 69*  CREATININE 4.82* 4.81* 4.63*  CALCIUM 7.8* 8.2* 7.9*  PHOS 7.2*  --  6.5*   Liver Function Tests:  Recent Labs  08/15/13 0433 08/17/13 0505  ALBUMIN 1.6* 1.6*   No results found for this basename: LIPASE, AMYLASE,  in the last 72 hours CBC:  Recent Labs  08/15/13 0433  WBC 6.8  HGB 9.8*  HCT 30.7*  MCV 93.9  PLT 320   Cardiac Enzymes:  Recent Labs  08/14/13 1455  TROPONINI <0.30   BNP: No components found with this basename: POCBNP,  D-Dimer: No results found for this basename: DDIMER,  in the last 72 hours Hemoglobin A1C: No results found for this basename: HGBA1C,  in the last 72 hours Fasting Lipid Panel: No results  found for this basename: CHOL, HDL, LDLCALC, TRIG, CHOLHDL, LDLDIRECT,  in the last 72 hours Thyroid Function Tests: No results found for this basename: TSH, T4TOTAL, FREET3, T3FREE, THYROIDAB,  in the last 72 hours Anemia Panel: No results found for this basename: VITAMINB12, FOLATE, FERRITIN, TIBC, IRON, RETICCTPCT,  in the last 72 hours Coag Panel:   Lab Results  Component Value Date   INR 0.89 12/04/2012   INR 1.01 07/29/2011   INR 0.9 07/15/2011    RADIOLOGY: Dg Chest Portable 1 View  08/13/2013   CLINICAL DATA:  Shortness of breath increasing for 2 weeks. History of CHF.  EXAM: PORTABLE CHEST - 1 VIEW  COMPARISON:  08/04/2013  FINDINGS: Again noted are bibasilar lung densities that are suggestive for pleural effusions and atelectasis. Heart size appears to be enlarged. There is some peribronchial thickening and cannot exclude pulmonary edema.  IMPRESSION: Bilateral pleural effusions with basilar atelectasis. There is mild pulmonary edema. Minimal change from the previous examination.   Electronically Signed   By: Markus Daft M.D.   On: 08/13/2013 23:10   Dg Chest Portable 1 View  08/04/2013   CLINICAL DATA:  Shortness of breath.  Cough this morning.  EXAM: PORTABLE CHEST - 1 VIEW  COMPARISON:  05/06/2013  FINDINGS: Apical lordotic positioning. Midline trachea. Cardiomegaly accentuated by AP portable technique. Suspicion of small bilateral pleural effusions. No pneumothorax. Mild interstitial edema with thickening of the right minor fissure. Lung bases not well evaluated secondary to AP portable technique and patient body habitus. Suspicion of bibasilar left greater than right airspace disease.  IMPRESSION: 1. Mild congestive heart failure with bilateral pleural effusions. 2. Probable bibasilar atelectasis or infection. Suboptimally evaluated secondary to technique and patient size. Consider PA and lateral radiographs if possible.   Electronically Signed   By: Abigail Miyamoto M.D.   On: 08/04/2013  12:23   Assessment/Plan  Principal Problem:  Acute on chronic renal failure  Active Problems:  Elevated troponin 0.35. Related to CHF. Would recommend outpt Lexiscan Myoview to further evaluate for ischemia since she has not had any assessment since her cath in 2013.  She has not had any chest pain this admission or at home  Acute on chronic diastolic heart failure Net fluids: -1.53/-2.9L/-6.7L. On PO torsemide 40mg  BID. EF 55-60%, no wall motion abnormality. SCr elevated but stable. Lungs are clear and LE has improved   Diabetic retinopathy associated with type 2 diabetes mellitus   Nephrotic syndrome   Coronary atherosclerosis of native coronary artery 30% prox/50% mid/40% distal LAD, 80% D1 and 50-60% mid left circ, 80% OM1 and occluded RCA with L to R collaterals byt cath 3/13- No angina   Hypokalemia - replete per IM   No further recs at this time - will sign off   Sueanne Margarita, MD  08/17/2013  9:43 AM

## 2013-08-17 NOTE — Plan of Care (Signed)
Problem: Phase I Progression Outcomes Goal: EF % per last Echo/documented,Core Reminder form on chart Outcome: Completed/Met Date Met:  08/17/13 The estimated ejection fraction was in the range of 55% to 60%

## 2013-08-17 NOTE — Progress Notes (Signed)
I have left a message on our office's scheduling voicemail requesting Lexiscan nuclear stress test and followup with Dr. Stanford Breed, and our office will call the patient with this appointment. Order placed in [place]. Serapio Edelson PA-C

## 2013-08-21 NOTE — ED Provider Notes (Signed)
Medical screening examination/treatment/procedure(s) were performed by non-physician practitioner and as supervising physician I was immediately available for consultation/collaboration.   EKG Interpretation   Date/Time:  Monday August 13 2013 19:36:58 EDT Ventricular Rate:  123 PR Interval:  156 QRS Duration: 72 QT Interval:  318 QTC Calculation: 455 R Axis:   79 Text Interpretation:  Sinus tachycardia Low voltage QRS Septal infarct ,  age undetermined ST \\T \ T wave abnormality, consider inferolateral  ischemia Abnormal ECG ED PHYSICIAN INTERPRETATION AVAILABLE IN [facility] Confirmed by TEST, Record ([id number]) on 08/15/2013 7:14:46 AM       Threasa Beards, MD 08/21/13 7373

## 2013-08-22 ENCOUNTER — Other Ambulatory Visit: Payer: Self-pay | Admitting: *Deleted

## 2013-08-22 ENCOUNTER — Ambulatory Visit (INDEPENDENT_AMBULATORY_CARE_PROVIDER_SITE_OTHER): Payer: Medicare Other | Admitting: Cardiothoracic Surgery

## 2013-08-22 ENCOUNTER — Ambulatory Visit
Admission: RE | Admit: 2013-08-22 | Discharge: 2013-08-22 | Disposition: A | Discharge status: Home / Self Care | Payer: Medicare Other | Source: Ambulatory Visit | Attending: Cardiothoracic Surgery | Admitting: Cardiothoracic Surgery

## 2013-08-22 ENCOUNTER — Encounter: Payer: Self-pay | Admitting: Cardiothoracic Surgery

## 2013-08-22 VITALS — BP 138/96 | HR 114 | Resp 20 | Ht 67.0 in | Wt 280.0 lb

## 2013-08-22 DIAGNOSIS — C3490 Malignant neoplasm of unspecified part of unspecified bronchus or lung: Secondary | ICD-10-CM

## 2013-08-22 DIAGNOSIS — Z902 Acquired absence of lung [part of]: Secondary | ICD-10-CM

## 2013-08-22 DIAGNOSIS — Z9889 Other specified postprocedural states: Secondary | ICD-10-CM

## 2013-08-22 DIAGNOSIS — Z85118 Personal history of other malignant neoplasm of bronchus and lung: Secondary | ICD-10-CM

## 2013-08-22 NOTE — Progress Notes (Signed)
PCP is Vic Blackbird, MD Referring Provider is [facility], Modena Nunnery, MD  Chief Complaint  Patient presents with  . Follow-up    1 year f/u with Chest CT    HPI: Patient examined and CT scan of chest reviewed 43 year old obese Caucasian diabetic female reformed smoker returns for 2 year chest CT scan surveillance on left lower lobe resection for stage I non-small cell carcinoma lung. She is a nonsmoker since her surgery. Unfortunately her renal failure has progressed. She apparently is heading towards dialysis. She recently was admitted for significant fluid retention and was evaluated by renal and cardiology. Echocardiogram showed normal LV EF with probable diastolic dysfunction. Creatinine is 4.5 4.8 and her diuretic doses were increased. She had bilateral pleural effusions and right hilar opacity on x-ray. Today her chest x-ray shows persistent moderate bilateral pleural effusions with some interstitial edema and a ill-defined density in the right lower lobe which may represent fluid in the fissure but does not look suspicious for recurrent cancer. The left lung has no evidence of recurrent malignancy and the mediastinal nose are mildly enlarged but appeared to be at low risk. It is difficult to interpret her CT scan due to the excess amount of fluid from her chronic renal failure.  I would recommend that a chest x-ray be performed in 6-8 weeks when she is evaluated by her primary care physician to followup the right infra--hilar density which appears to be fluid related or inflammatory   Past Medical History  Diagnosis Date  . High cholesterol   . Diabetic retinopathy   . Peripheral neuropathy     "tips of toes"  . Blind right eye   . Cardiomyopathy   . CHF (congestive heart failure)   . CAD (coronary artery disease)     NO STENTS  . GERD (gastroesophageal reflux disease)   . Hypertension     under control with med., has been on med. x 1 yr.  . History of lung cancer 07/2011    s/p  left lower lobectomy  . Diabetes mellitus     IDDM  . Runny nose 07/10/2012    clear drainage  . Cataract of right eye   . Ulcer of toe of right foot 07/10/2012    great toe  . Breast calcification, left 06/2012  . Nephrotic syndrome   . Nephrotic range proteinuria   . Cancer     Adenocarcinoma of lung  . Acute biphenotypic leukemia     Past Surgical History  Procedure Laterality Date  . Cardiac catheterization  07/16/2011  . Incision and drainage breast abscess Left   . Tubal ligation  1994  . Vitrectomy  2010    2 on left, 1 on right  . Cesarean section  1991; 1994  . Video assisted thoracoscopy (vats)/ lobectomy Left 07/30/2011    left main thoracotomy, left lower lobectomy, mediastinal lymph node dissection  . Lobectomy    . Colonoscopy with esophagogastroduodenoscopy (egd) N/A 08/14/2012    Procedure: COLONOSCOPY WITH ESOPHAGOGASTRODUODENOSCOPY (EGD);  Surgeon: Danie Binder, MD;  Location: [facility];  Service: Endoscopy;  Laterality: N/A;  10:45-moved to [address] to notify pt  . Breast lumpectomy with needle localization Left 11/14/2012    Procedure: BREAST LUMPECTOMY WITH NEEDLE LOCALIZATION;  Surgeon: Marcello Moores A. Cornett, MD;  Location: [facility];  Service: General;  Laterality: Left;    Family History  Problem Relation Age of Onset  . Coronary artery disease Father   . Asthma Father   .  COPD Father   . Hypertension Father   . Hyperlipidemia Father   . Diabetes Father   . Congestive Heart Failure Father   . Heart disease Father   . Heart attack Father   . Peripheral vascular disease Father   . Hypertension Mother   . Hyperlipidemia Mother   . Diabetes Mother   . Cancer Mother   . Cancer Maternal Aunt      three aunts, bone, breast, ?  . Hypertension Brother   . Diabetes Brother   . Diabetes Sister   . Colon cancer Neg Hx   . Celiac disease Neg Hx   . Crohn's disease Neg Hx   . Ulcerative colitis Neg Hx   . Lung cancer Maternal Grandmother     Social  History History  Substance Use Topics  . Smoking status: Former Smoker -- 2.00 packs/day for 30 years    Quit date: 08/01/2011  . Smokeless tobacco: Never Used  . Alcohol Use: No    Current Outpatient Prescriptions  Medication Sig Dispense Refill  . acetaminophen (TYLENOL) 500 MG tablet Take 500 mg by mouth every 8 (eight) hours as needed for mild pain.      Marland Kitchen aspirin EC 81 MG EC tablet Take 1 tablet (81 mg total) by mouth daily.  30 tablet  0  . cholecalciferol (VITAMIN D) 1000 UNITS tablet Take 1,000 Units by mouth daily.      . cyclobenzaprine (FLEXERIL) 10 MG tablet Take 5-10 mg by mouth 3 (three) times daily as needed for muscle spasms.      . hydrOXYzine (ATARAX/VISTARIL) 25 MG tablet Take 1 tablet (25 mg total) by mouth 3 (three) times daily as needed.  30 tablet  0  . Insulin Detemir (LEVEMIR FLEXPEN) 100 UNIT/ML Pen Inject 34 Units into the skin every evening.  15 mL  11  . insulin lispro (HUMALOG KWIKPEN) 100 UNIT/ML KiwkPen Inject 0.03 mLs (3 Units total) into the skin 3 (three) times daily with meals.  15 mL  0  . methylcellulose (ARTIFICIAL TEARS) 1 % ophthalmic solution Place 1 drop into both eyes at bedtime as needed (dry eyes).      . metolazone (ZAROXOLYN) 5 MG tablet Take 1 tablet (5 mg total) by mouth daily as needed.  30 tablet  0  . metoprolol succinate (TOPROL-XL) 25 MG 24 hr tablet Take 1 tablet (25 mg total) by mouth daily.  30 tablet  6  . potassium chloride SA (K-DUR,KLOR-CON) 20 MEQ tablet Take 1 tablet (20 mEq total) by mouth 2 (two) times daily.  60 tablet  0  . pravastatin (PRAVACHOL) 20 MG tablet Take 1 tablet (20 mg total) by mouth daily at 6 PM.  30 tablet  0  . torsemide (DEMADEX) 20 MG tablet Take 2 tablets (40 mg total) by mouth 2 (two) times daily.  90 tablet  0   No current facility-administered medications for this visit.    Allergies  Allergen Reactions  . Crestor [Rosuvastatin] Other (See Comments)    Severe muscle weakness  . Nsaids     Not  allergic, "bad on my kidneys"  . Ciprofloxacin Rash    Review of Systems patient still has moderate pedal edema. She is not on home oxygen. She has a dry cough. She is in sinus rhythm  BP 138/96  Pulse 114  Resp 20  Ht 5\' 7"  (1.702 m)  Wt 280 lb (127.007 kg)  BMI 43.84 kg/m2  SpO2 97%  LMP 07/29/2013 Physical  Exam  Alert and comfortable No cervical adenopathy palpable Breath sounds diminished bilaterally Heart rate regular and elevated but without murmur or gallop Moderate pedal edema bilateral  Diagnostic Tests: Chest CT scan without contrast reviewed and discussed with patient  Impression: Although the CT scan is somewhat obscured by the amount of fluid in her lungs and pleural space there is no suspicious change for recurrent cancer of her left lung. Will continue plan annual CT scans of chest, now without contrast. Recommend PA and lateral chest x-ray which he is reviewed by her primary care physician in 6 weeks    Plan-return for chest CT scan for surveillance of resection of left lower lobe for non-small cell carcinoma in one year

## 2013-08-24 ENCOUNTER — Encounter: Payer: Self-pay | Admitting: Surgery

## 2013-08-27 ENCOUNTER — Ambulatory Visit (INDEPENDENT_AMBULATORY_CARE_PROVIDER_SITE_OTHER): Payer: Medicare Other | Admitting: Surgery

## 2013-08-27 ENCOUNTER — Ambulatory Visit (INDEPENDENT_AMBULATORY_CARE_PROVIDER_SITE_OTHER)
Admission: RE | Admit: 2013-08-27 | Discharge: 2013-08-27 | Disposition: A | Discharge status: Home / Self Care | Payer: Medicare Other | Source: Ambulatory Visit | Attending: Surgery | Admitting: Surgery

## 2013-08-27 ENCOUNTER — Ambulatory Visit (HOSPITAL_COMMUNITY)
Admission: RE | Admit: 2013-08-27 | Discharge: 2013-08-27 | Disposition: A | Discharge status: Home / Self Care | Payer: Medicare Other | Source: Ambulatory Visit | Attending: Surgery | Admitting: Surgery

## 2013-08-27 ENCOUNTER — Encounter: Payer: Self-pay | Admitting: Surgery

## 2013-08-27 VITALS — BP 111/66 | HR 120 | Ht 67.0 in | Wt 281.0 lb

## 2013-08-27 DIAGNOSIS — N186 End stage renal disease: Secondary | ICD-10-CM | POA: Insufficient documentation

## 2013-08-27 DIAGNOSIS — Z01810 Encounter for preprocedural cardiovascular examination: Secondary | ICD-10-CM

## 2013-08-27 DIAGNOSIS — N184 Chronic kidney disease, stage 4 (severe): Secondary | ICD-10-CM | POA: Insufficient documentation

## 2013-08-27 DIAGNOSIS — I6529 Occlusion and stenosis of unspecified carotid artery: Secondary | ICD-10-CM

## 2013-08-27 NOTE — Progress Notes (Signed)
Patient name: Hannah [name] MRN: [id number] DOB: 1970/05/14 Sex: female   Referred by: Dr. Marval Regal  Reason for referral:  Chief Complaint  Patient presents with  . New Evaluation    disucss HD placement    HISTORY OF PRESENT ILLNESS: The patient is referred today for dialysis access.  I initially met her as a hospital consult in February 2013.  At that time, she is undergoing treatment for stage I lung cancer.  She ultimately underwent left lower lobectomy.  She did not require chemotherapy.  During the hospitalization she was found to have greater than 80% left carotid stenosis and 60-79% right carotid stenosis.  She is asymptomatic.  When I saw her in followup, a repeat ultrasound revealed 60-79% bilateral carotid stenosis.  I would normally get a confirmatory study, however with her renal disease I did not want to give her contrast.  She was asymptomatic and therefore I elected to have her followup in 6 months.  Unfortunately, she did not show back up until today.  She remained asymptomatic from her carotid perspective.  The patient has a history of CHF.  She is scheduled to have a imaging study this week.  She is a long-time smoker.  She suffers from diabetes.  Historically, her blood sugars have not been well controlled.  I remember seeing a hemoglobin A1c greater than 12.  She is medically managed for hypertension.  She is on a statin for hypercholesterolemia.  Her renal function has worsened, now with a creatinine greater than 4 and a GFR around 10.    Past Medical History  Diagnosis Date  . High cholesterol   . Diabetic retinopathy   . Peripheral neuropathy     "tips of toes"  . Blind right eye   . Cardiomyopathy   . CHF (congestive heart failure)   . CAD (coronary artery disease)     NO STENTS  . GERD (gastroesophageal reflux disease)   . Hypertension     under control with med., has been on med. x 1 yr.  . History of lung cancer 07/2011    s/p left lower lobectomy    . Diabetes mellitus     IDDM  . Runny nose 07/10/2012    clear drainage  . Cataract of right eye   . Ulcer of toe of right foot 07/10/2012    great toe  . Breast calcification, left 06/2012  . Nephrotic syndrome   . Nephrotic range proteinuria   . Cancer     Adenocarcinoma of lung  . Acute biphenotypic leukemia     Past Surgical History  Procedure Laterality Date  . Cardiac catheterization  07/16/2011  . Incision and drainage breast abscess Left   . Tubal ligation  1994  . Vitrectomy  2010    2 on left, 1 on right  . Cesarean section  1991; 1994  . Video assisted thoracoscopy (vats)/ lobectomy Left 07/30/2011    left main thoracotomy, left lower lobectomy, mediastinal lymph node dissection  . Lobectomy    . Colonoscopy with esophagogastroduodenoscopy (egd) N/A 08/14/2012    Procedure: COLONOSCOPY WITH ESOPHAGOGASTRODUODENOSCOPY (EGD);  Surgeon: Danie Binder, MD;  Location: [facility];  Service: Endoscopy;  Laterality: N/A;  10:45-moved to 1110 [address] to notify pt  . Breast lumpectomy with needle localization Left 11/14/2012    Procedure: BREAST LUMPECTOMY WITH NEEDLE LOCALIZATION;  Surgeon: Marcello Moores A. Cornett, MD;  Location: [facility];  Service: General;  Laterality: Left;  History   Social History  . Marital Status: Married    Spouse Name: N/A    Number of Children: 3  . Years of Education: N/A   Occupational History  . Not on file.   Social History Main Topics  . Smoking status: Former Smoker -- 2.00 packs/day for 30 years    Quit date: 08/01/2011  . Smokeless tobacco: Never Used  . Alcohol Use: No  . Drug Use: No  . Sexual Activity: Yes   Other Topics Concern  . Not on file   Social History Narrative  . No narrative on file    Family History  Problem Relation Age of Onset  . Coronary artery disease Father   . Asthma Father   . COPD Father   . Hypertension Father   . Hyperlipidemia Father   . Diabetes Father   . Congestive Heart Failure Father   .  Heart disease Father     before age 24  . Heart attack Father   . Peripheral vascular disease Father   . Hypertension Mother   . Hyperlipidemia Mother   . Diabetes Mother   . Cancer Mother   . Cancer Maternal Aunt      three aunts, bone, breast, ?  . Hypertension Brother   . Diabetes Brother   . Diabetes Sister   . Colon cancer Neg Hx   . Celiac disease Neg Hx   . Crohn's disease Neg Hx   . Ulcerative colitis Neg Hx   . Lung cancer Maternal Grandmother     Allergies as of 08/27/2013 - Review Complete 08/27/2013  Allergen Reaction Noted  . Crestor [rosuvastatin] Other (See Comments) 11/02/2012  . Nsaids  05/05/2013  . Ciprofloxacin Rash 05/31/2011    Current Outpatient Prescriptions on File Prior to Visit  Medication Sig Dispense Refill  . acetaminophen (TYLENOL) 500 MG tablet Take 500 mg by mouth every 8 (eight) hours as needed for mild pain.      Marland Kitchen aspirin EC 81 MG EC tablet Take 1 tablet (81 mg total) by mouth daily.  30 tablet  0  . cholecalciferol (VITAMIN D) 1000 UNITS tablet Take 1,000 Units by mouth daily.      . cyclobenzaprine (FLEXERIL) 10 MG tablet Take 5-10 mg by mouth 3 (three) times daily as needed for muscle spasms.      . hydrOXYzine (ATARAX/VISTARIL) 25 MG tablet Take 1 tablet (25 mg total) by mouth 3 (three) times daily as needed.  30 tablet  0  . Insulin Detemir (LEVEMIR FLEXPEN) 100 UNIT/ML Pen Inject 34 Units into the skin every evening.  15 mL  11  . insulin lispro (HUMALOG KWIKPEN) 100 UNIT/ML KiwkPen Inject 0.03 mLs (3 Units total) into the skin 3 (three) times daily with meals.  15 mL  0  . methylcellulose (ARTIFICIAL TEARS) 1 % ophthalmic solution Place 1 drop into both eyes at bedtime as needed (dry eyes).      . metolazone (ZAROXOLYN) 5 MG tablet Take 1 tablet (5 mg total) by mouth daily as needed.  30 tablet  0  . metoprolol succinate (TOPROL-XL) 25 MG 24 hr tablet Take 1 tablet (25 mg total) by mouth daily.  30 tablet  6  . potassium chloride SA  (K-DUR,KLOR-CON) 20 MEQ tablet Take 1 tablet (20 mEq total) by mouth 2 (two) times daily.  60 tablet  0  . pravastatin (PRAVACHOL) 20 MG tablet Take 1 tablet (20 mg total) by mouth daily at 6 PM.  30 tablet  0  . torsemide (DEMADEX) 20 MG tablet Take 2 tablets (40 mg total) by mouth 2 (two) times daily.  90 tablet  0   No current facility-administered medications on file prior to visit.     REVIEW OF SYSTEMS: Cardiovascular: No chest pain, chest pressure, palpitations, orthopnea, or dyspnea on exertion. No claudication or rest pain,  No history of DVT or phlebitis. Pulmonary: No productive cough, asthma or wheezing. Neurologic: No weakness, paresthesias, aphasia, or amaurosis. No dizziness. Hematologic: No bleeding problems or clotting disorders. Musculoskeletal: No joint pain or joint swelling. Gastrointestinal: No blood in stool or hematemesis Genitourinary: No dysuria or hematuria. Psychiatric:: No history of major depression. Integumentary: No rashes or ulcers. Constitutional: No fever or chills.  PHYSICAL EXAMINATION: General: The patient appears their stated age.  Vital signs are BP 111/66  Pulse 120  Ht 5' 7"  (1.702 m)  Wt 281 lb (127.461 kg)  BMI 44.00 kg/m2  SpO2 100%  LMP 07/29/2013 HEENT:  No gross abnormalities Pulmonary: Respirations are non-labored Musculoskeletal: There are no major deformities.   Neurologic: No focal weakness or paresthesias are detected, Skin: There are no ulcer or rashes noted. Psychiatric: The patient has normal affect. Cardiovascular: There is a regular rate and rhythm without significant murmur appreciated.  Palpable right radial pulse, nonpalpable left.  Waveforms are triphasic on the right radial and monocytes biphasic on the left.  Diagnostic Studies: The patient's vein mapping was reviewed.  She has adequate cephalic veins bilaterally.  Arterial imaging showed a positive Allen test on the left with monophasic radial waveforms.  She had  triphasic right radial waveforms.    Assessment:  #1: End-stage renal disease #2 colon carotid occlusive disease Plan: #1: I suspect the patient has left subclavian stenosis given the monophasic left radial waveforms.  Because of her arterial evaluation of the left, I would not recommend a left-sided access.  I therefore have recommended a right radiocephalic vs. brachiocephalic fistula.  We discussed the risks and benefits of surgery, including the risk of steal syndrome, and the need for future interventions secondary to non-maturation of the fistula.  #2: He is been greater than one year since the patient had a carotid ultrasound.  She remained asymptomatic.  I told her that I would not place her dialysis access until I confirm the degree of stenosis within her carotid arteries.  If she has greater than 80% carotid stenosis I would proceed with carotid endarterectomy prior to dialysis access surgery.  The patient got here too late in the day for [name] to do a carotid study.  This will be scheduled for later this week and I will contact her as to whether or not we proceed with fistula vs. carotid endarterectomy.     Eldridge Abrahams, M.D. [facility] Office: [phone] Pager:  [phone]

## 2013-08-29 ENCOUNTER — Other Ambulatory Visit: Payer: Self-pay | Admitting: Family Medicine

## 2013-08-29 ENCOUNTER — Other Ambulatory Visit (HOSPITAL_COMMUNITY): Payer: Medicare Other

## 2013-08-31 ENCOUNTER — Ambulatory Visit (HOSPITAL_COMMUNITY)
Admission: RE | Admit: 2013-08-31 | Discharge: 2013-08-31 | Disposition: A | Discharge status: Home / Self Care | Payer: Medicare Other | Source: Ambulatory Visit | Attending: Surgery | Admitting: Surgery

## 2013-08-31 DIAGNOSIS — I6529 Occlusion and stenosis of unspecified carotid artery: Secondary | ICD-10-CM | POA: Insufficient documentation

## 2013-08-31 DIAGNOSIS — N186 End stage renal disease: Secondary | ICD-10-CM | POA: Insufficient documentation

## 2013-08-31 DIAGNOSIS — Z01810 Encounter for preprocedural cardiovascular examination: Secondary | ICD-10-CM

## 2013-09-03 ENCOUNTER — Encounter (HOSPITAL_COMMUNITY): Payer: Self-pay | Admitting: Interventional Cardiology

## 2013-09-03 ENCOUNTER — Inpatient Hospital Stay: Admission: AD | Admit: 2013-09-03 | Payer: Self-pay | Source: Ambulatory Visit | Admitting: Cardiology

## 2013-09-03 ENCOUNTER — Encounter: Payer: Self-pay | Admitting: Cardiology

## 2013-09-03 ENCOUNTER — Other Ambulatory Visit: Payer: Self-pay

## 2013-09-03 ENCOUNTER — Inpatient Hospital Stay (HOSPITAL_COMMUNITY)
Admission: EM | Admit: 2013-09-03 | Discharge: 2013-10-29 | DRG: 237 | Disposition: A | Discharge status: Home Health | Payer: Medicare Other | Attending: Interventional Cardiology | Admitting: Interventional Cardiology

## 2013-09-03 ENCOUNTER — Encounter (HOSPITAL_COMMUNITY): Payer: Medicare Other

## 2013-09-03 ENCOUNTER — Ambulatory Visit (HOSPITAL_BASED_OUTPATIENT_CLINIC_OR_DEPARTMENT_OTHER): Payer: Medicare Other | Admitting: Radiology

## 2013-09-03 VITALS — BP 84/46 | HR 109 | Ht 67.0 in | Wt 280.0 lb

## 2013-09-03 DIAGNOSIS — E43 Unspecified severe protein-calorie malnutrition: Secondary | ICD-10-CM | POA: Diagnosis not present

## 2013-09-03 DIAGNOSIS — Z992 Dependence on renal dialysis: Secondary | ICD-10-CM

## 2013-09-03 DIAGNOSIS — E11319 Type 2 diabetes mellitus with unspecified diabetic retinopathy without macular edema: Secondary | ICD-10-CM

## 2013-09-03 DIAGNOSIS — I6529 Occlusion and stenosis of unspecified carotid artery: Secondary | ICD-10-CM

## 2013-09-03 DIAGNOSIS — N049 Nephrotic syndrome with unspecified morphologic changes: Secondary | ICD-10-CM | POA: Diagnosis present

## 2013-09-03 DIAGNOSIS — Z7982 Long term (current) use of aspirin: Secondary | ICD-10-CM

## 2013-09-03 DIAGNOSIS — G609 Hereditary and idiopathic neuropathy, unspecified: Secondary | ICD-10-CM | POA: Diagnosis present

## 2013-09-03 DIAGNOSIS — D75829 Heparin-induced thrombocytopenia, unspecified: Secondary | ICD-10-CM

## 2013-09-03 DIAGNOSIS — I10 Essential (primary) hypertension: Secondary | ICD-10-CM

## 2013-09-03 DIAGNOSIS — E119 Type 2 diabetes mellitus without complications: Secondary | ICD-10-CM | POA: Diagnosis present

## 2013-09-03 DIAGNOSIS — T8131XA Disruption of external operation (surgical) wound, not elsewhere classified, initial encounter: Secondary | ICD-10-CM | POA: Diagnosis not present

## 2013-09-03 DIAGNOSIS — I472 Ventricular tachycardia, unspecified: Secondary | ICD-10-CM

## 2013-09-03 DIAGNOSIS — E785 Hyperlipidemia, unspecified: Secondary | ICD-10-CM | POA: Diagnosis present

## 2013-09-03 DIAGNOSIS — J9601 Acute respiratory failure with hypoxia: Secondary | ICD-10-CM

## 2013-09-03 DIAGNOSIS — D638 Anemia in other chronic diseases classified elsewhere: Secondary | ICD-10-CM | POA: Diagnosis not present

## 2013-09-03 DIAGNOSIS — I743 Embolism and thrombosis of arteries of the lower extremities: Secondary | ICD-10-CM | POA: Diagnosis present

## 2013-09-03 DIAGNOSIS — I4729 Other ventricular tachycardia: Secondary | ICD-10-CM | POA: Diagnosis present

## 2013-09-03 DIAGNOSIS — G9340 Encephalopathy, unspecified: Secondary | ICD-10-CM | POA: Diagnosis not present

## 2013-09-03 DIAGNOSIS — K5900 Constipation, unspecified: Secondary | ICD-10-CM | POA: Diagnosis not present

## 2013-09-03 DIAGNOSIS — R110 Nausea: Secondary | ICD-10-CM

## 2013-09-03 DIAGNOSIS — Z825 Family history of asthma and other chronic lower respiratory diseases: Secondary | ICD-10-CM

## 2013-09-03 DIAGNOSIS — J9620 Acute and chronic respiratory failure, unspecified whether with hypoxia or hypercapnia: Secondary | ICD-10-CM | POA: Diagnosis not present

## 2013-09-03 DIAGNOSIS — I509 Heart failure, unspecified: Secondary | ICD-10-CM | POA: Insufficient documentation

## 2013-09-03 DIAGNOSIS — R9430 Abnormal result of cardiovascular function study, unspecified: Secondary | ICD-10-CM

## 2013-09-03 DIAGNOSIS — Z85118 Personal history of other malignant neoplasm of bronchus and lung: Secondary | ICD-10-CM | POA: Insufficient documentation

## 2013-09-03 DIAGNOSIS — I428 Other cardiomyopathies: Secondary | ICD-10-CM

## 2013-09-03 DIAGNOSIS — I658 Occlusion and stenosis of other precerebral arteries: Secondary | ICD-10-CM | POA: Diagnosis present

## 2013-09-03 DIAGNOSIS — N184 Chronic kidney disease, stage 4 (severe): Secondary | ICD-10-CM

## 2013-09-03 DIAGNOSIS — J4489 Other specified chronic obstructive pulmonary disease: Secondary | ICD-10-CM | POA: Diagnosis present

## 2013-09-03 DIAGNOSIS — C9590 Leukemia, unspecified not having achieved remission: Secondary | ICD-10-CM | POA: Diagnosis present

## 2013-09-03 DIAGNOSIS — I200 Unstable angina: Secondary | ICD-10-CM | POA: Diagnosis present

## 2013-09-03 DIAGNOSIS — D649 Anemia, unspecified: Secondary | ICD-10-CM

## 2013-09-03 DIAGNOSIS — I2589 Other forms of chronic ischemic heart disease: Secondary | ICD-10-CM | POA: Diagnosis present

## 2013-09-03 DIAGNOSIS — I498 Other specified cardiac arrhythmias: Secondary | ICD-10-CM | POA: Diagnosis present

## 2013-09-03 DIAGNOSIS — I2510 Atherosclerotic heart disease of native coronary artery without angina pectoris: Secondary | ICD-10-CM

## 2013-09-03 DIAGNOSIS — I25119 Atherosclerotic heart disease of native coronary artery with unspecified angina pectoris: Secondary | ICD-10-CM

## 2013-09-03 DIAGNOSIS — D7582 Heparin induced thrombocytopenia (HIT): Secondary | ICD-10-CM

## 2013-09-03 DIAGNOSIS — Z6841 Body Mass Index (BMI) 40.0 and over, adult: Secondary | ICD-10-CM

## 2013-09-03 DIAGNOSIS — N185 Chronic kidney disease, stage 5: Secondary | ICD-10-CM

## 2013-09-03 DIAGNOSIS — R112 Nausea with vomiting, unspecified: Secondary | ICD-10-CM | POA: Diagnosis present

## 2013-09-03 DIAGNOSIS — I469 Cardiac arrest, cause unspecified: Secondary | ICD-10-CM

## 2013-09-03 DIAGNOSIS — E1149 Type 2 diabetes mellitus with other diabetic neurological complication: Secondary | ICD-10-CM

## 2013-09-03 DIAGNOSIS — I120 Hypertensive chronic kidney disease with stage 5 chronic kidney disease or end stage renal disease: Secondary | ICD-10-CM | POA: Diagnosis present

## 2013-09-03 DIAGNOSIS — R9431 Abnormal electrocardiogram [ECG] [EKG]: Secondary | ICD-10-CM

## 2013-09-03 DIAGNOSIS — Z794 Long term (current) use of insulin: Secondary | ICD-10-CM

## 2013-09-03 DIAGNOSIS — Z8249 Family history of ischemic heart disease and other diseases of the circulatory system: Secondary | ICD-10-CM

## 2013-09-03 DIAGNOSIS — J810 Acute pulmonary edema: Secondary | ICD-10-CM

## 2013-09-03 DIAGNOSIS — R570 Cardiogenic shock: Secondary | ICD-10-CM

## 2013-09-03 DIAGNOSIS — S31109A Unspecified open wound of abdominal wall, unspecified quadrant without penetration into peritoneal cavity, initial encounter: Secondary | ICD-10-CM

## 2013-09-03 DIAGNOSIS — J449 Chronic obstructive pulmonary disease, unspecified: Secondary | ICD-10-CM | POA: Diagnosis present

## 2013-09-03 DIAGNOSIS — I2582 Chronic total occlusion of coronary artery: Secondary | ICD-10-CM | POA: Diagnosis present

## 2013-09-03 DIAGNOSIS — N186 End stage renal disease: Secondary | ICD-10-CM

## 2013-09-03 DIAGNOSIS — R34 Anuria and oliguria: Secondary | ICD-10-CM | POA: Diagnosis not present

## 2013-09-03 DIAGNOSIS — E1139 Type 2 diabetes mellitus with other diabetic ophthalmic complication: Secondary | ICD-10-CM | POA: Diagnosis present

## 2013-09-03 DIAGNOSIS — R778 Other specified abnormalities of plasma proteins: Secondary | ICD-10-CM

## 2013-09-03 DIAGNOSIS — Z87891 Personal history of nicotine dependence: Secondary | ICD-10-CM

## 2013-09-03 DIAGNOSIS — E875 Hyperkalemia: Secondary | ICD-10-CM | POA: Diagnosis not present

## 2013-09-03 DIAGNOSIS — I749 Embolism and thrombosis of unspecified artery: Secondary | ICD-10-CM | POA: Diagnosis not present

## 2013-09-03 DIAGNOSIS — G253 Myoclonus: Secondary | ICD-10-CM

## 2013-09-03 DIAGNOSIS — Z833 Family history of diabetes mellitus: Secondary | ICD-10-CM

## 2013-09-03 DIAGNOSIS — N2581 Secondary hyperparathyroidism of renal origin: Secondary | ICD-10-CM | POA: Diagnosis present

## 2013-09-03 DIAGNOSIS — R197 Diarrhea, unspecified: Secondary | ICD-10-CM

## 2013-09-03 DIAGNOSIS — R0602 Shortness of breath: Secondary | ICD-10-CM

## 2013-09-03 DIAGNOSIS — Y9210 Unspecified residential institution as the place of occurrence of the external cause: Secondary | ICD-10-CM | POA: Diagnosis not present

## 2013-09-03 DIAGNOSIS — R7989 Other specified abnormal findings of blood chemistry: Principal | ICD-10-CM

## 2013-09-03 DIAGNOSIS — I82A19 Acute embolism and thrombosis of unspecified axillary vein: Secondary | ICD-10-CM | POA: Diagnosis not present

## 2013-09-03 DIAGNOSIS — I5023 Acute on chronic systolic (congestive) heart failure: Principal | ICD-10-CM

## 2013-09-03 DIAGNOSIS — Y838 Other surgical procedures as the cause of abnormal reaction of the patient, or of later complication, without mention of misadventure at the time of the procedure: Secondary | ICD-10-CM | POA: Diagnosis not present

## 2013-09-03 DIAGNOSIS — I82A11 Acute embolism and thrombosis of right axillary vein: Secondary | ICD-10-CM

## 2013-09-03 DIAGNOSIS — H5440 Blindness, one eye, unspecified eye: Secondary | ICD-10-CM | POA: Diagnosis present

## 2013-09-03 DIAGNOSIS — I998 Other disorder of circulatory system: Secondary | ICD-10-CM

## 2013-09-03 DIAGNOSIS — Z801 Family history of malignant neoplasm of trachea, bronchus and lung: Secondary | ICD-10-CM

## 2013-09-03 HISTORY — DX: Anemia, unspecified: D64.9

## 2013-09-03 HISTORY — DX: Gastritis, unspecified, without bleeding: K29.70

## 2013-09-03 HISTORY — DX: Peripheral vascular disease, unspecified: I73.9

## 2013-09-03 HISTORY — DX: Disorder of arteries and arterioles, unspecified: I77.9

## 2013-09-03 HISTORY — DX: Chronic kidney disease, stage 5: N18.5

## 2013-09-03 LAB — COMPREHENSIVE METABOLIC PANEL
ALT: 16 U/L (ref 0–35)
AST: 13 U/L (ref 0–37)
Albumin: 2 g/dL — ABNORMAL LOW (ref 3.5–5.2)
Alkaline Phosphatase: 68 U/L (ref 39–117)
BUN: 66 mg/dL — ABNORMAL HIGH (ref 6–23)
CALCIUM: 8 mg/dL — AB (ref 8.4–10.5)
CHLORIDE: 100 meq/L (ref 96–112)
CO2: 25 mEq/L (ref 19–32)
Creatinine, Ser: 4.46 mg/dL — ABNORMAL HIGH (ref 0.50–1.10)
GFR, EST AFRICAN AMERICAN: 13 mL/min — AB (ref >90)
GFR, EST NON AFRICAN AMERICAN: 11 mL/min — AB (ref >90)
GLUCOSE: 172 mg/dL — AB (ref 70–99)
Potassium: 3.9 mEq/L (ref 3.7–5.3)
SODIUM: 141 meq/L (ref 137–147)
Total Protein: 5.5 g/dL — ABNORMAL LOW (ref 6.0–8.3)

## 2013-09-03 LAB — TROPONIN I: Troponin I: 0.45 ng/mL (ref <0.30)

## 2013-09-03 LAB — PROTIME-INR
INR: 1.09 (ref 0.00–1.49)
Prothrombin Time: 13.9 seconds (ref 11.6–15.2)

## 2013-09-03 LAB — CBC
HCT: 33.2 % — ABNORMAL LOW (ref 36.0–46.0)
Hemoglobin: 10.6 g/dL — ABNORMAL LOW (ref 12.0–15.0)
MCH: 29.4 pg (ref 26.0–34.0)
MCHC: 31.9 g/dL (ref 30.0–36.0)
MCV: 92.2 fL (ref 78.0–100.0)
Platelets: 441 10*3/uL — ABNORMAL HIGH (ref 150–400)
RBC: 3.6 MIL/uL — ABNORMAL LOW (ref 3.87–5.11)
RDW: 13.6 % (ref 11.5–15.5)
WBC: 9.4 10*3/uL (ref 4.0–10.5)

## 2013-09-03 LAB — I-STAT TROPONIN, ED: Troponin i, poc: 0.9 ng/mL (ref 0.00–0.08)

## 2013-09-03 LAB — URINALYSIS, ROUTINE W REFLEX MICROSCOPIC
BILIRUBIN URINE: NEGATIVE
Glucose, UA: 250 mg/dL — AB
KETONES UR: NEGATIVE mg/dL
Leukocytes, UA: NEGATIVE
NITRITE: NEGATIVE
PH: 6.5 (ref 5.0–8.0)
Protein, ur: >300 mg/dL — AB
Specific Gravity, Urine: 1.014 (ref 1.005–1.030)
Urobilinogen, UA: 0.2 mg/dL (ref 0.0–1.0)

## 2013-09-03 LAB — URINE MICROSCOPIC-ADD ON

## 2013-09-03 LAB — APTT: APTT: 32 s (ref 24–37)

## 2013-09-03 LAB — GLUCOSE, CAPILLARY: GLUCOSE-CAPILLARY: 130 mg/dL — AB (ref 70–99)

## 2013-09-03 MED ORDER — TECHNETIUM TC 99M SESTAMIBI GENERIC - CARDIOLITE
30.0000 | Freq: Once | INTRAVENOUS | Status: AC | PRN
  Administered 2013-09-03: 30 via INTRAVENOUS

## 2013-09-03 MED ORDER — HEPARIN BOLUS VIA INFUSION
2500.0000 [IU] | Freq: Once | INTRAVENOUS | Status: AC
  Administered 2013-09-03: 2500 [IU] via INTRAVENOUS
  Filled 2013-09-03: qty 2500

## 2013-09-03 MED ORDER — SODIUM CHLORIDE 0.9 % IV BOLUS (SEPSIS)
500.0000 mL | Freq: Once | INTRAVENOUS | Status: AC
  Administered 2013-09-03: 500 mL via INTRAVENOUS

## 2013-09-03 MED ORDER — SODIUM CHLORIDE 0.9 % IV SOLN: INTRAVENOUS | Status: DC

## 2013-09-03 MED ORDER — METOPROLOL SUCCINATE ER 25 MG PO TB24
25.0000 mg | ORAL_TABLET | Freq: Every day | ORAL | Status: DC
  Administered 2013-09-04 – 2013-09-05 (×2): 25 mg via ORAL
  Filled 2013-09-03 (×3): qty 1

## 2013-09-03 MED ORDER — SODIUM CHLORIDE 0.9 % IV SOLN
250.0000 mL | INTRAVENOUS | Status: DC | PRN
  Administered 2013-09-05 – 2013-09-06 (×2): 250 mL via INTRAVENOUS
  Administered 2013-09-07: 1000 mL via INTRAVENOUS

## 2013-09-03 MED ORDER — ACETAMINOPHEN 500 MG PO TABS: 500.0000 mg | ORAL_TABLET | Freq: Three times a day (TID) | ORAL | Status: DC | PRN

## 2013-09-03 MED ORDER — SODIUM CHLORIDE 0.9 % IJ SOLN
3.0000 mL | INTRAMUSCULAR | Status: DC | PRN
Start: 2013-09-03 — End: 2013-09-22
  Administered 2013-09-10 – 2013-09-19 (×4): 3 mL via INTRAVENOUS

## 2013-09-03 MED ORDER — ASPIRIN EC 81 MG PO TBEC
81.0000 mg | DELAYED_RELEASE_TABLET | Freq: Every day | ORAL | Status: DC
  Administered 2013-09-05 – 2013-09-17 (×13): 81 mg via ORAL
  Filled 2013-09-03 (×15): qty 1

## 2013-09-03 MED ORDER — NITROGLYCERIN 0.4 MG SL SUBL: 0.4000 mg | SUBLINGUAL_TABLET | SUBLINGUAL | Status: DC | PRN

## 2013-09-03 MED ORDER — CYCLOBENZAPRINE HCL 10 MG PO TABS
5.0000 mg | ORAL_TABLET | Freq: Three times a day (TID) | ORAL | Status: DC | PRN
  Administered 2013-09-04: 10 mg via ORAL
  Administered 2013-09-06 – 2013-09-07 (×2): 5 mg via ORAL
  Administered 2013-09-07 – 2013-09-08 (×3): 10 mg via ORAL
  Administered 2013-09-08: 5 mg via ORAL
  Administered 2013-09-08: 10 mg via ORAL
  Administered 2013-09-09 – 2013-09-11 (×4): 5 mg via ORAL
  Administered 2013-09-15 – 2013-09-16 (×2): 10 mg via ORAL
  Administered 2013-09-20 – 2013-10-21 (×14): 5 mg via ORAL
  Administered 2013-10-28 – 2013-10-29 (×2): 10 mg via ORAL
  Filled 2013-09-03 (×23): qty 1

## 2013-09-03 MED ORDER — INSULIN DETEMIR 100 UNIT/ML ~~LOC~~ SOLN
34.0000 [IU] | Freq: Every day | SUBCUTANEOUS | Status: DC
  Administered 2013-09-03 – 2013-09-04 (×2): 34 [IU] via SUBCUTANEOUS
  Filled 2013-09-03 (×3): qty 0.34

## 2013-09-03 MED ORDER — STERILE WATER FOR INJECTION IV SOLN
INTRAVENOUS | Status: AC
  Administered 2013-09-03: 19:00:00 via INTRAVENOUS
  Filled 2013-09-03 (×3): qty 850

## 2013-09-03 MED ORDER — VITAMIN D3 25 MCG (1000 UNIT) PO TABS
1000.0000 [IU] | ORAL_TABLET | Freq: Every day | ORAL | Status: DC
  Administered 2013-09-04 – 2013-09-09 (×6): 1000 [IU] via ORAL
  Filled 2013-09-03 (×7): qty 1

## 2013-09-03 MED ORDER — METHYLCELLULOSE 1 % OP SOLN: 1.0000 [drp] | Freq: Every evening | OPHTHALMIC | Status: DC | PRN

## 2013-09-03 MED ORDER — ALPRAZOLAM 0.25 MG PO TABS
0.2500 mg | ORAL_TABLET | Freq: Two times a day (BID) | ORAL | Status: DC | PRN
  Administered 2013-09-07 – 2013-09-09 (×2): 0.25 mg via ORAL
  Filled 2013-09-03 (×3): qty 1

## 2013-09-03 MED ORDER — ACETAMINOPHEN 325 MG PO TABS
650.0000 mg | ORAL_TABLET | ORAL | Status: DC | PRN
  Administered 2013-09-07 – 2013-10-04 (×29): 650 mg via ORAL
  Filled 2013-09-03 (×30): qty 2

## 2013-09-03 MED ORDER — ACETYLCYSTEINE 20 % IN SOLN
600.0000 mg | Freq: Two times a day (BID) | RESPIRATORY_TRACT | Status: DC
  Filled 2013-09-03: qty 4

## 2013-09-03 MED ORDER — ACETYLCYSTEINE 20 % IN SOLN
1200.0000 mg | Freq: Two times a day (BID) | RESPIRATORY_TRACT | Status: AC
  Administered 2013-09-03 – 2013-09-05 (×4): 1200 mg via ORAL
  Filled 2013-09-03 (×4): qty 8

## 2013-09-03 MED ORDER — ZOLPIDEM TARTRATE 5 MG PO TABS: 5.0000 mg | ORAL_TABLET | Freq: Every evening | ORAL | Status: DC | PRN

## 2013-09-03 MED ORDER — INSULIN ASPART 100 UNIT/ML ~~LOC~~ SOLN
3.0000 [IU] | Freq: Three times a day (TID) | SUBCUTANEOUS | Status: DC
  Administered 2013-09-04 – 2013-09-05 (×2): 3 [IU] via SUBCUTANEOUS

## 2013-09-03 MED ORDER — POLYVINYL ALCOHOL 1.4 % OP SOLN: 1.0000 [drp] | OPHTHALMIC | Status: DC | PRN

## 2013-09-03 MED ORDER — SODIUM CHLORIDE 0.9 % IJ SOLN
3.0000 mL | Freq: Two times a day (BID) | INTRAMUSCULAR | Status: DC
  Administered 2013-09-03 – 2013-09-20 (×20): 3 mL via INTRAVENOUS

## 2013-09-03 MED ORDER — HEPARIN (PORCINE) IN NACL 100-0.45 UNIT/ML-% IJ SOLN
1450.0000 [IU]/h | INTRAMUSCULAR | Status: DC
  Administered 2013-09-03: 1250 [IU]/h via INTRAVENOUS
  Administered 2013-09-04 (×2): 1450 [IU]/h via INTRAVENOUS
  Filled 2013-09-03 (×4): qty 250

## 2013-09-03 MED ORDER — SIMVASTATIN 10 MG PO TABS
10.0000 mg | ORAL_TABLET | Freq: Every day | ORAL | Status: DC
  Administered 2013-09-03 – 2013-09-07 (×5): 10 mg via ORAL
  Filled 2013-09-03 (×6): qty 1

## 2013-09-03 MED ORDER — AMINOPHYLLINE 25 MG/ML IV SOLN
75.0000 mg | Freq: Once | INTRAVENOUS | Status: AC
  Administered 2013-09-03: 75 mg via INTRAVENOUS

## 2013-09-03 MED ORDER — INSULIN LISPRO 100 UNIT/ML (KWIKPEN): 3.0000 [IU] | PEN_INJECTOR | Freq: Three times a day (TID) | SUBCUTANEOUS | Status: DC

## 2013-09-03 MED ORDER — ONDANSETRON HCL 4 MG/2ML IJ SOLN
4.0000 mg | Freq: Four times a day (QID) | INTRAMUSCULAR | Status: DC | PRN
  Administered 2013-09-10 – 2013-10-29 (×15): 4 mg via INTRAVENOUS
  Filled 2013-09-03 (×15): qty 2

## 2013-09-03 MED ORDER — INSULIN DETEMIR 100 UNIT/ML FLEXPEN: 34.0000 [IU] | PEN_INJECTOR | Freq: Every evening | SUBCUTANEOUS | Status: DC

## 2013-09-03 MED ORDER — REGADENOSON 0.4 MG/5ML IV SOLN
0.4000 mg | Freq: Once | INTRAVENOUS | Status: AC
  Administered 2013-09-03: 0.4 mg via INTRAVENOUS

## 2013-09-03 NOTE — Consult Note (Signed)
Renal Service Consult Note [facility]  Hannah [name] 09/03/2013 Sol Blazing Requesting Physician:  Dr Stark Jock  Reason for Consult:  CKD patient needs heart cath HPI: The patient is a 43 y.o. year-old with long hx of DM2, HTN.  Also had lung cancer in 2013 rx with surgery. She had a negative breast biopsy last year.  Was undergoing preop eval for dialysis AVF placement and a stress test done today was very abnormal so pt was sent to ED for admission and the plan is for heart cath tomorrow. Pt states she has been having SOB, DOE and orthopnea.  Also ankle swelling no worse than usual. No cough or CP.  No fevers.  No n/v/d, or abd pain.  Voiding ok, no change in urination.   Has had DM since her 20's and was on oral agents most of the time.  Just started on insulin last year.  Has CKD and is f/b Dr Arty Baumgartner at [facility] for the last two years.  In the hospital in April the creat was 4.82 with eGFR 10%.   ROS  no HA  no visual chg  no jt pain or skin rash  no confusion of hallucination  Hosp admissions: Feb 2013-- longstanding type 2 diabetic presented with SOB, edema, and N/V.  10 gm proteinuria, vol excess, was diuresed, f/u OP nephrology.  EF 35-40%. Pulm nodule w/u as well Apr 2013-- L VATS, left lung lower lobectomy and mediastinal LN dissection for left lower lobe mass which was adenoCa on path. Creat 1.23 July 2012-- presented with worsening LE edema, facial and abd swelling also. Diuresed, seen by Neph, started on ARB. LV function was normal July 2014-- L breast lumpectomy Aug 2014-- admit for renal biopsy done 1/88, no complications Jan 4166-- presented with vomiting and diarrhea, acute on CKD, dehydration. Creat improved with IVF's down to 3.1 on discharge April 2015-- ^SOB, edema x 2 wks, acute on CRF, CKD 4, DM2, HTN.  Diuresed with IV lasix and d/c'd on Demadex 40 bid + metolazone. Hb A1C was 9%. CKD due to DM.      Past Medical History  Past Medical History   Diagnosis Date  . High cholesterol   . Diabetic retinopathy   . Peripheral neuropathy     "tips of toes"  . Blind right eye   . Cardiomyopathy   . CHF (congestive heart failure)   . CAD (coronary artery disease)     NO STENTS  . GERD (gastroesophageal reflux disease)   . Hypertension     under control with med., has been on med. x 1 yr.  . History of lung cancer 07/2011    s/p left lower lobectomy  . Diabetes mellitus     IDDM  . Runny nose 07/10/2012    clear drainage  . Cataract of right eye   . Ulcer of toe of right foot 07/10/2012    great toe  . Breast calcification, left 06/2012  . Nephrotic syndrome   . Nephrotic range proteinuria   . Cancer     Adenocarcinoma of lung  . Acute biphenotypic leukemia   . Nonspecific abnormal unspecified cardiovascular function study   . Other primary cardiomyopathies   . Nonspecific abnormal electrocardiogram (ECG) (EKG)    Past Surgical History  Past Surgical History  Procedure Laterality Date  . Cardiac catheterization  07/16/2011  . Incision and drainage breast abscess Left   . Tubal ligation  1994  . Vitrectomy  2010  2 on left, 1 on right  . Cesarean section  1991; 1994  . Video assisted thoracoscopy (vats)/ lobectomy Left 07/30/2011    left main thoracotomy, left lower lobectomy, mediastinal lymph node dissection  . Lobectomy    . Colonoscopy with esophagogastroduodenoscopy (egd) N/A 08/14/2012    Procedure: COLONOSCOPY WITH ESOPHAGOGASTRODUODENOSCOPY (EGD);  Surgeon: Danie Binder, MD;  [facility];  Service: Endoscopy;  Laterality: N/A;  10:45-moved to [address] to notify pt  . Breast lumpectomy with needle localization Left 11/14/2012    Procedure: BREAST LUMPECTOMY WITH NEEDLE LOCALIZATION;  Surgeon: Marcello Moores A. Cornett, MD;  Location: [place] [facility];  Service: General;  Laterality: Left;   Family History  Family History  Problem Relation Age of Onset  . Coronary artery disease Father   . Asthma Father   . COPD  Father   . Hypertension Father   . Hyperlipidemia Father   . Diabetes Father   . Congestive Heart Failure Father   . Heart disease Father     before age 31  . Heart attack Father   . Peripheral vascular disease Father   . Hypertension Mother   . Hyperlipidemia Mother   . Diabetes Mother   . Cancer Mother   . Cancer Maternal Aunt      three aunts, bone, breast, ?  . Hypertension Brother   . Diabetes Brother   . Diabetes Sister   . Colon cancer Neg Hx   . Celiac disease Neg Hx   . Crohn's disease Neg Hx   . Ulcerative colitis Neg Hx   . Lung cancer Maternal Grandmother    Social History  reports that she quit smoking about 2 years ago. She has never used smokeless tobacco. She reports that she does not drink alcohol or use illicit drugs. Allergies  Allergies  Allergen Reactions  . Crestor [Rosuvastatin] Other (See Comments)    Severe muscle weakness  . Nsaids Other (See Comments)    Not allergic, "bad on my kidneys"  . Ciprofloxacin Rash   Home medications Prior to Admission medications   Medication Sig Start Date End Date Taking? Authorizing Provider  acetaminophen (TYLENOL) 500 MG tablet Take 500 mg by mouth every 8 (eight) hours as needed for mild pain.   Yes Historical Provider, MD  aspirin EC 81 MG EC tablet Take 1 tablet (81 mg total) by mouth daily. 08/17/13  Yes Belkys A Regalado, MD  cholecalciferol (VITAMIN D) 1000 UNITS tablet Take 1,000 Units by mouth daily.   Yes Historical Provider, MD  cyclobenzaprine (FLEXERIL) 10 MG tablet Take 5-10 mg by mouth 3 (three) times daily as needed for muscle spasms. 07/06/13  Yes Alycia Rossetti, MD  Insulin Detemir (LEVEMIR FLEXPEN) 100 UNIT/ML Pen Inject 34 Units into the skin every evening. 08/17/13  Yes Belkys A Regalado, MD  insulin lispro (HUMALOG KWIKPEN) 100 UNIT/ML KiwkPen Inject 0.03 mLs (3 Units total) into the skin 3 (three) times daily with meals. 08/17/13  Yes Belkys A Regalado, MD  methylcellulose (ARTIFICIAL TEARS) 1 %  ophthalmic solution Place 1 drop into both eyes at bedtime as needed (dry eyes).   Yes Historical Provider, MD  metolazone (ZAROXOLYN) 5 MG tablet Take 1 tablet (5 mg total) by mouth daily as needed. 08/17/13  Yes Belkys A Regalado, MD  metoprolol succinate (TOPROL-XL) 25 MG 24 hr tablet Take 1 tablet (25 mg total) by mouth daily. 07/08/13  Yes Alycia Rossetti, MD  potassium chloride SA (K-DUR,KLOR-CON) 20 MEQ tablet  Take 1 tablet (20 mEq total) by mouth 2 (two) times daily. 08/17/13  Yes Belkys A Regalado, MD  pravastatin (PRAVACHOL) 20 MG tablet Take 1 tablet (20 mg total) by mouth daily at 6 PM. 08/17/13  Yes Belkys A Regalado, MD  torsemide (DEMADEX) 20 MG tablet Take 2 tablets (40 mg total) by mouth 2 (two) times daily. 08/17/13  Yes Elmarie Shiley, MD   Liver Function Tests  Recent Labs Lab 09/03/13 1352  AST 13  ALT 16  ALKPHOS 68  BILITOT <0.2*  PROT 5.5*  ALBUMIN 2.0*   No results found for this basename: LIPASE, AMYLASE,  in the last 168 hours CBC  Recent Labs Lab 09/03/13 1352  WBC 9.4  HGB 10.6*  HCT 33.2*  MCV 92.2  PLT 381*   Basic Metabolic Panel  Recent Labs Lab 09/03/13 1352  NA 141  K 3.9  CL 100  CO2 25  GLUCOSE 172*  BUN 66*  CREATININE 4.46*  CALCIUM 8.0*    Filed Vitals:   09/03/13 1432 09/03/13 1500 09/03/13 1617 09/03/13 1652  BP: 111/89 106/38 88/68 121/88  Pulse:  107 104 100  Temp:      TempSrc:      Resp:  18 17 18   Weight:      SpO2:  97% 97% 98%   Exam Obese WF in NAD, calm and pleasant, BP 128/78 No rash, cyanosis or gangrene Sclera anicteric, throat clear No JVD, flat neck veins Chest is clear bilat RRR no MRG Abd obese, soft, NTND, no ascites LE bilat pretibial edema 1-2+, not severe Neuro is nf, Ox3  Assessment: 1 CKD stage V 2 Abnormal cardiac stress test - done today 5/18 3 DM 2 longstanding 4 Hist of lung cancer- treated surgically 2013 5 HTN on 2 diuretics and metoprolol at home 6 Mild vol excess 7  Obesity  Plan- pt is at high risk for dialysis-requiring AKI after contrast procedure.  Main precautions to take are to limit contrast (avoid LV-gram, etc) amount, use an iso-osmolal contrast agent if available (e.g., iodixanol) and hydrate with NS or bicarb gtt.  Pt has recent hx of volume issues but is not grossly overloaded now and has room for cautious IVF"s prior to procedure.  Have changed to bicarb gtt at 65/hr.  Pt has no perm access and understands that if dialysis is required that a tunneled HD cath will need to be placed. Increased Mucomyst to 1200 mg bid also. Will follow.    [name] MD (pgr) [phone]    ([phone] 09/03/2013, 5:27 PM

## 2013-09-03 NOTE — Progress Notes (Signed)
[facility] [address] [place], [place] [address] [phone]    Cardiology Nuclear Med Study  Hannah [name] is a 43 y.o. female     MRN : [id number]     DOB: 1970-05-01  Procedure Date: 09/03/2013  Nuclear Med Background Indication for Stress Test:  Evaluation for Ischemia, Surgical Clearance:Fistula Placement by [facility]:Dyspnea with increased troponins History:  CAD, Cath (RCA 100% tx medically), Echo 2014 EF 55-60, CHF, MPI 2013 (ischemia) EF 49%, hx. lung cancer (lobectomy, CKD (stage 4) Cardiac Risk Factors: Carotid Disease, Family History - CAD, History of Smoking, Hypertension, Lipids, PVD and IDDM  Symptoms:  SOB   Nuclear Pre-Procedure Caffeine/Decaff Intake:  None NPO After: 9:00pm   Lungs:  clear O2 Sat: 94% on room air. IV 0.9% NS with Angio Cath:  22g  IV Site: R Hand  IV Started by:  Matilde Haymaker, RN  Chest Size (in):  42 Cup Size: C  Height: 5\' 7"  (1.702 m)  Weight:  280 lb (127.007 kg)  BMI:  Body mass index is 43.84 kg/(m^2). Tech Comments:  Toprol taken at 0800 today    Nuclear Med Study 1 or 2 day study: 2 day Stress only study Stress Test Type:  Carlton Adam  Reading MD: n/a  Order Authorizing Provider:  Queen Blossom  Resting Radionuclide: Technetium 58m Sestamibi  Resting Radionuclide Dose: n/a mCi   Stress Radionuclide:  Technetium 23m Sestamibi  Stress Radionuclide Dose: 33.0 mCi           Stress Protocol Rest HR: 109 Stress HR: 116  Rest BP: 90/46 Stress BP: 58/40  Exercise Time (min): n/a METS: n/a           Dose of Adenosine (mg):  n/a Dose of Lexiscan: 0.4 mg  Dose of Atropine (mg): n/a Dose of Dobutamine: n/a mcg/kg/min (at max HR)  Stress Test Technologist: Glade Lloyd, BS-ES  Nuclear Technologist:  Annye Rusk, CNMT     Rest Procedure:  Myocardial perfusion imaging was performed at rest 45 minutes following the intravenous administration of Technetium 26m Sestamibi. Rest  ECG: Normal sinus rhythm. Diffuse ST-T wave changes with ST depression. Decreased anterior R wave progression.  Stress Procedure:  The patient received IV Lexiscan 0.4 mg over 15-seconds.  Technetium 53m Sestamibi injected at 30-seconds.  Quantitative spect images were obtained after a 45 minute delay.  During the infusion of Lexiscan, the patient complained of SOB and severe nausea.  Due to the nausea 75mg  aminophylline was given and the patient began to feel significantly better within a few minutes.  Stress ECG: No significant change from baseline ECG  QPS Raw Data Images:  Normal; no motion artifact; normal heart/lung ratio. Stress Images:  There is a large area of moderately severe decreased uptake affecting the apical cap, all 4 apical segments, and the base/mid anterolateral segments. Rest Images:   Decision was made to not obtain rest images. The stress images are abnormal and cardiac catheterization will be done. Subtraction (SDS):  Rest images were not obtained Transient Ischemic Dilatation (Normal <1.22):  n/a Lung/Heart Ratio (Normal <0.45):  0.58  Quantitative Gated Spect Images QGS EDV:  223 ml QGS ESV:  173 ml  Impression Exercise Capacity:  Lexiscan with no exercise. BP Response:  The patient's blood pressure started at 90 over 47. Soon after isotope injection blood pressure was 58/40. Blood pressure responded to Aminophyllin and fluids. Clinical Symptoms:  Soon after the patient received a nuclear  isotope, blood pressure dropped to 58/40. The patient had nausea that was severe with shortness of breath. Aminophyllin was given. Patient received IV fluids. Blood pressure stabilized back to his baseline of 85/44. ECG Impression:  EKG was abnormal at rest. Therefore there was no marked change with the stress. Comparison with Prior Nuclear Study:  I have compared these stress only images with the report of the study from March, 2013. In 2013 the report describes small defects in the  anterior wall and possibly in the inferior wall. The ejection fraction on that study was 49%. The current study is significantly different than the report from 2013.  Overall Impression:  This is a high-risk scan. It seems to be changed from the report of 2013. The patient had significant symptoms and had hypotension. There is a large anterolateral and apical defect seen on the stress images. Decision was made to not obtain rest images. The patient was admitted to the hospital with plans for cardiac catheterization. The wall motion was also significantly worse than the prior study report.  LV Ejection Fraction: 22%.  LV Wall Motion:   There is severe global hypokinesis.  Carlena Bjornstad, MD

## 2013-09-03 NOTE — H&P (Signed)
Admit date: 09/03/2013 Referring Physician Dr. Martinique  Primary Cardiologist Dr. Martinique Chief complaint/reason for admission: High-risk abnormal stress test  HPI: 43 year old woman who has peripheral vascular disease including carotid stenosis. She has severe renal dysfunction as well. She was in the midst of workup for AV fistula surgery. A stress test was ordered. This was done today in the office and revealed anterior and apical ischemia with an ejection fraction of 22%. This is a significant change from her prior nuclear stress test a few years ago. She was referred for admission due to the high-risk nature of her stress test. She does not report any significant chest discomfort at this time.  She has had worsening orthopnea and exertional dyspnea over the past few weeks. She also noted some edema. She was seen in the emergency room back in April and admitted to the hospital.  She did have a troponin leak at that time of unclear significance due to her presumed diastolic dysfunction and acute on chronic renal failure.    PMH:    Past Medical History  Diagnosis Date  . High cholesterol   . Diabetic retinopathy   . Peripheral neuropathy     "tips of toes"  . Blind right eye   . Cardiomyopathy   . CHF (congestive heart failure)   . CAD (coronary artery disease)     NO STENTS  . GERD (gastroesophageal reflux disease)   . Hypertension     under control with med., has been on med. x 1 yr.  . History of lung cancer 07/2011    s/p left lower lobectomy  . Diabetes mellitus     IDDM  . Runny nose 07/10/2012    clear drainage  . Cataract of right eye   . Ulcer of toe of right foot 07/10/2012    great toe  . Breast calcification, left 06/2012  . Nephrotic syndrome   . Nephrotic range proteinuria   . Cancer     Adenocarcinoma of lung  . Acute biphenotypic leukemia     PSH:    Past Surgical History  Procedure Laterality Date  . Cardiac catheterization  07/16/2011  . Incision and  drainage breast abscess Left   . Tubal ligation  1994  . Vitrectomy  2010    2 on left, 1 on right  . Cesarean section  1991; 1994  . Video assisted thoracoscopy (vats)/ lobectomy Left 07/30/2011    left main thoracotomy, left lower lobectomy, mediastinal lymph node dissection  . Lobectomy    . Colonoscopy with esophagogastroduodenoscopy (egd) N/A 08/14/2012    Procedure: COLONOSCOPY WITH ESOPHAGOGASTRODUODENOSCOPY (EGD);  Surgeon: Danie Binder, MD;  [facility];  Service: Endoscopy;  Laterality: N/A;  10:45-moved to [address] to notify pt  . Breast lumpectomy with needle localization Left 11/14/2012    Procedure: BREAST LUMPECTOMY WITH NEEDLE LOCALIZATION;  Surgeon: Marcello Moores A. Cornett, MD;  Location: [facility];  Service: General;  Laterality: Left;    ALLERGIES:   Crestor; Nsaids; and Ciprofloxacin  Prior to Admit Meds:   (Not in a hospital admission) Family HX:    Family History  Problem Relation Age of Onset  . Coronary artery disease Father   . Asthma Father   . COPD Father   . Hypertension Father   . Hyperlipidemia Father   . Diabetes Father   . Congestive Heart Failure Father   . Heart disease Father     before age 35  . Heart attack Father   .  Peripheral vascular disease Father   . Hypertension Mother   . Hyperlipidemia Mother   . Diabetes Mother   . Cancer Mother   . Cancer Maternal Aunt      three aunts, bone, breast, ?  . Hypertension Brother   . Diabetes Brother   . Diabetes Sister   . Colon cancer Neg Hx   . Celiac disease Neg Hx   . Crohn's disease Neg Hx   . Ulcerative colitis Neg Hx   . Lung cancer Maternal Grandmother    Social HX:    History   Social History  . Marital Status: Married    Spouse Name: N/A    Number of Children: 3  . Years of Education: N/A   Occupational History  . Not on file.   Social History Main Topics  . Smoking status: Former Smoker -- 2.00 packs/day for 30 years    Quit date: 08/01/2011  . Smokeless  tobacco: Never Used  . Alcohol Use: No  . Drug Use: No  . Sexual Activity: Yes   Other Topics Concern  . Not on file   Social History Narrative  . No narrative on file     ROS:  All 11 ROS were addressed and are negative except what is stated in the HPI  PHYSICAL EXAM Filed Vitals:   09/03/13 1617  BP: 88/68  Pulse: 104  Temp:   Resp: 17   General: Well developed, well nourished, in no acute distress Head: Eyes PERRLA, No xanthomas.   Normal cephalic and atramatic  Lungs:  Clear bilaterally to auscultation and percussion. Heart:   HRRR S1 S2              No JVD.   Abdomen:  abdomen soft and non-tender  Msk:  Back normal,  Normal strength and tone for age. Extremities:  Trace pretibial bilateral edema.  DP +1 Neuro: Alert and oriented X 3. Psych:  Normal affect, responds appropriately   Labs:   Lab Results  Component Value Date   WBC 9.4 09/03/2013   HGB 10.6* 09/03/2013   HCT 33.2* 09/03/2013   MCV 92.2 09/03/2013   PLT 441* 09/03/2013    Recent Labs Lab 09/03/13 1352  NA 141  K 3.9  CL 100  CO2 25  BUN 66*  CREATININE 4.46*  CALCIUM 8.0*  PROT 5.5*  BILITOT <0.2*  ALKPHOS 68  ALT 16  AST 13  GLUCOSE 172*   Lab Results  Component Value Date   CKTOTAL 166 06/10/2011   CKMB 6.1* 06/10/2011   TROPONINI <0.30 08/14/2013   No results found for this basename: PTT   Lab Results  Component Value Date   INR 0.89 12/04/2012   INR 1.01 07/29/2011   INR 0.9 07/15/2011     Lab Results  Component Value Date   CHOL 308* 11/22/2012   CHOL 262* 05/05/2012   CHOL 240* 08/30/2011   Lab Results  Component Value Date   HDL 34* 11/22/2012   HDL 31* 05/05/2012   HDL 47.50 08/30/2011   Lab Results  Component Value Date   LDLCALC Comment:   Not calculated due to Triglyceride >400. Suggest ordering Direct LDL (Unit Code: [phone]).   Total Cholesterol/HDL Ratio:CHD Risk                        Coronary Heart Disease Risk Table  Men        Women          1/2 Average Risk              3.4        3.3              Average Risk              5.0        4.4           2X Average Risk              9.6        7.1           3X Average Risk             23.4       11.0 Use the calculated Patient Ratio above and the CHD Risk table  to determine the patient's CHD Risk. ATP III Classification (LDL):       < 100        mg/dL         Optimal      100 - 129     mg/dL         Near or Above Optimal      130 - 159     mg/dL         Borderline High      160 - 189     mg/dL         High       > 190        mg/dL         Very High   11/22/2012   LDLCALC Comment:   Not calculated due to Triglyceride >400. Suggest ordering Direct LDL (Unit Code: [phone]).   Total Cholesterol/HDL Ratio:CHD Risk                        Coronary Heart Disease Risk Table                                        Men       Women          1/2 Average Risk              3.4        3.3              Average Risk              5.0        4.4           2X Average Risk              9.6        7.1           3X Average Risk             23.4       11.0 Use the calculated Patient Ratio above and the CHD Risk table  to determine the patient's CHD Risk. ATP III Classification (LDL):       < 100        mg/dL         Optimal      100 - 129     mg/dL  Near or Above Optimal      130 - 159     mg/dL         Borderline High      160 - 189     mg/dL         High       > 190        mg/dL         Very High   05/05/2012   LDLCALC 233* 06/10/2011   Lab Results  Component Value Date   TRIG 430* 11/22/2012   TRIG 464* 05/05/2012   TRIG 123.0 08/30/2011   Lab Results  Component Value Date   CHOLHDL 9.1 11/22/2012   CHOLHDL 8.5 05/05/2012   CHOLHDL 5 08/30/2011   Lab Results  Component Value Date   LDLDIRECT 177.2 08/30/2011      Radiology:  No results found.  EKG:  Sinus tachycardia, poor R wave progression, lateral ST segment changes  ASSESSMENT: 43 year old with known peripheral vascular disease who now has a  high-risk stress test.  PLAN:  1) would normally proceed with cardiac catheterization. The situation is made more difficult because of her severe renal insufficiency. Consult nephrology. They will see the patient tomorrow. Until then, we will hydrate the patient overnight gently, as we do not want to precipitate congestive heart failure. Recheck creatinine in the morning. She has a very high likelihood of significant coronary artery disease. She will need cardiac catheterization after renal function is optimized. We'll place on IV heparin as well. We'll have to be prepared for need for dialysis post cardiac catheterization.  2) abnormal ECG: Tachycardia may be related to her low EF. She also has poor R wave progression, likely from her anterior ischemia.  3) renal insufficiency: As noted above. Maximize hydration without causing heart failure in a patient with an ejection fraction of 22%.  4) performed serial disease: Carotid arteries being worked up as the thought was she may require some type of carotid intervention. We'll inform vascular surgery that she is in the hospital. However, I think her cardiac workup will take precedence.  Jettie Booze, MD  09/03/2013  4:38 PM

## 2013-09-03 NOTE — ED Notes (Signed)
Attempted report x1. 

## 2013-09-03 NOTE — Progress Notes (Unsigned)
Patient here for Hannah [name] today. Recent admission for CHF exacerbation and chronic renal failure. Denies any chest pain but does have dyspnea on exertion. Lexiscan today associated with marked hypotension. Stress images demonstrate marked abnormalities with anterior, apical, and inferior defects. EF 22%. Marked change compared with prior stress test. Will need admission for hydration and renal consult. She will need cardiac cath once renal function stable.   Peter Martinique MD, [facility]

## 2013-09-03 NOTE — ED Notes (Signed)
Cardiology at bedside.

## 2013-09-03 NOTE — ED Notes (Signed)
Results of troponin given to Dr. Ashok Cordia and the charge nurse was informed.

## 2013-09-03 NOTE — ED Notes (Addendum)
Pt was seen at droffice today and had abnormal stress test today ( I got pt at 7 min mark from triage) pt states that she has increasing sob today esp when she walks states just released last week for sob has IV from dr office left hand pt states placed today

## 2013-09-03 NOTE — Consult Note (Signed)
ANTICOAGULATION CONSULT NOTE - Initial Consult  Pharmacy Consult for Heparin Indication: chest pain/ACS  Allergies  Allergen Reactions  . Crestor [Rosuvastatin] Other (See Comments)    Severe muscle weakness  . Nsaids Other (See Comments)    Not allergic, "bad on my kidneys"  . Ciprofloxacin Rash    Patient Measurements: Height: 5' 6.93" (170 cm) Weight: 280 lb (127.007 kg) IBW/kg (Calculated) : 61.44 Heparin Dosing Weight: 91kg  Vital Signs: Temp: 97.5 F (36.4 C) (05/18 1427) Temp src: Oral (05/18 1427) BP: 129/63 mmHg (05/18 1930) Pulse Rate: 102 (05/18 1930)  Labs:  Recent Labs  09/03/13 1352  HGB 10.6*  HCT 33.2*  PLT 441*  CREATININE 4.46*    Estimated Creatinine Clearance: 22 ml/min (by C-G formula based on Cr of 4.46).   Medical History: Past Medical History  Diagnosis Date  . High cholesterol   . Diabetic retinopathy   . Peripheral neuropathy     "tips of toes"  . Blind right eye   . Cardiomyopathy   . CHF (congestive heart failure)   . CAD (coronary artery disease)     NO STENTS  . GERD (gastroesophageal reflux disease)   . Hypertension     under control with med., has been on med. x 1 yr.  . History of lung cancer 07/2011    s/p left lower lobectomy  . Diabetes mellitus     IDDM  . Runny nose 07/10/2012    clear drainage  . Cataract of right eye   . Ulcer of toe of right foot 07/10/2012    great toe  . Breast calcification, left 06/2012  . Cancer     Adenocarcinoma of lung  . Acute biphenotypic leukemia   . Other primary cardiomyopathies   . Nonspecific abnormal electrocardiogram (ECG) (EKG)   . CKD (chronic kidney disease) stage 5, GFR less than 15 ml/min   . Nephrotic syndrome     Medications:  No anticoagulants pta  Assessment: 43yof with severe renal dysfunction in the midst of work-up for AV fistula surgery who was ordered a nuclear stress test. It was done today and revealed anterior and apical ischemia with an ejection  fraction of 22%. First troponin is elevated at 0.9. She will begin IV heparin.   Goal of Therapy:  Heparin level 0.3-0.7 units/ml Monitor platelets by anticoagulation protocol: Yes   Plan:  1) Heparin bolus 2500 units x 1 (small d/t renal dysfunction) 2) Heparin drip at 1250 units/hr 3) Check 8 hour heparin level 4) Daily heparin level and CBC  Benjamine Sprague Rabab Currington 09/03/2013,7:49 PM

## 2013-09-03 NOTE — ED Provider Notes (Signed)
CSN: [id number]     Arrival date & time 09/03/13  1330 History   First MD Initiated Contact with Patient 09/03/13 1420     Chief Complaint  Patient presents with  . Shortness of Breath  . Chest Pain     (Consider location/radiation/quality/duration/timing/severity/associated sxs/prior Treatment) HPI Comments: Patient is a 43 year old female with history of chronic renal insufficiency, diabetes, obesity. She was sent here from the cardiology office for admission. She apparently had a lack CT scan performed this morning which was markedly abnormal. She was to be a direct admit, however there were no beds and she was sent to the emergency department to be admitted. She reports shortness of breath but denies to me she is having active chest pain. She denies fevers, chills, or cough.  Patient is a 43 y.o. female presenting with shortness of breath. The history is provided by the patient.  Shortness of Breath Severity:  Moderate Onset quality:  Sudden Duration:  1 week Timing:  Constant Progression:  Worsening Chronicity:  New Context: activity   Relieved by:  Nothing Worsened by:  Nothing tried   Past Medical History  Diagnosis Date  . High cholesterol   . Diabetic retinopathy   . Peripheral neuropathy     "tips of toes"  . Blind right eye   . Cardiomyopathy   . CHF (congestive heart failure)   . CAD (coronary artery disease)     NO STENTS  . GERD (gastroesophageal reflux disease)   . Hypertension     under control with med., has been on med. x 1 yr.  . History of lung cancer 07/2011    s/p left lower lobectomy  . Diabetes mellitus     IDDM  . Runny nose 07/10/2012    clear drainage  . Cataract of right eye   . Ulcer of toe of right foot 07/10/2012    great toe  . Breast calcification, left 06/2012  . Nephrotic syndrome   . Nephrotic range proteinuria   . Cancer     Adenocarcinoma of lung  . Acute biphenotypic leukemia    Past Surgical History  Procedure Laterality  Date  . Cardiac catheterization  07/16/2011  . Incision and drainage breast abscess Left   . Tubal ligation  1994  . Vitrectomy  2010    2 on left, 1 on right  . Cesarean section  1991; 1994  . Video assisted thoracoscopy (vats)/ lobectomy Left 07/30/2011    left main thoracotomy, left lower lobectomy, mediastinal lymph node dissection  . Lobectomy    . Colonoscopy with esophagogastroduodenoscopy (egd) N/A 08/14/2012    Procedure: COLONOSCOPY WITH ESOPHAGOGASTRODUODENOSCOPY (EGD);  Surgeon: Danie Binder, MD;  Location: [facility];  Service: Endoscopy;  Laterality: N/A;  10:45-moved to 1110 Leigh Ann to notify pt  . Breast lumpectomy with needle localization Left 11/14/2012    Procedure: BREAST LUMPECTOMY WITH NEEDLE LOCALIZATION;  Surgeon: Marcello Moores A. Cornett, MD;  Location: [facility];  Service: General;  Laterality: Left;   Family History  Problem Relation Age of Onset  . Coronary artery disease Father   . Asthma Father   . COPD Father   . Hypertension Father   . Hyperlipidemia Father   . Diabetes Father   . Congestive Heart Failure Father   . Heart disease Father     before age 37  . Heart attack Father   . Peripheral vascular disease Father   . Hypertension Mother   . Hyperlipidemia Mother   .  Diabetes Mother   . Cancer Mother   . Cancer Maternal Aunt      three aunts, bone, breast, ?  . Hypertension Brother   . Diabetes Brother   . Diabetes Sister   . Colon cancer Neg Hx   . Celiac disease Neg Hx   . Crohn's disease Neg Hx   . Ulcerative colitis Neg Hx   . Lung cancer Maternal Grandmother    History  Substance Use Topics  . Smoking status: Former Smoker -- 2.00 packs/day for 30 years    Quit date: 08/01/2011  . Smokeless tobacco: Never Used  . Alcohol Use: No   OB History   Grav Para Term Preterm Abortions TAB SAB Ect Mult Living                 Review of Systems  Respiratory: Positive for shortness of breath.   All other systems reviewed and are  negative.     Allergies  Crestor; Nsaids; and Ciprofloxacin  Home Medications   Prior to Admission medications   Medication Sig Start Date End Date Taking? Authorizing Provider  acetaminophen (TYLENOL) 500 MG tablet Take 500 mg by mouth every 8 (eight) hours as needed for mild pain.    Historical Provider, MD  aspirin EC 81 MG EC tablet Take 1 tablet (81 mg total) by mouth daily. 08/17/13   Belkys A Regalado, MD  cholecalciferol (VITAMIN D) 1000 UNITS tablet Take 1,000 Units by mouth daily.    Historical Provider, MD  cyclobenzaprine (FLEXERIL) 10 MG tablet Take 5-10 mg by mouth 3 (three) times daily as needed for muscle spasms. 07/06/13   Alycia Rossetti, MD  hydrOXYzine (ATARAX/VISTARIL) 25 MG tablet Take 1 tablet (25 mg total) by mouth 3 (three) times daily as needed. 07/06/13   Alycia Rossetti, MD  Insulin Detemir (LEVEMIR FLEXPEN) 100 UNIT/ML Pen Inject 34 Units into the skin every evening. 08/17/13   Belkys A Regalado, MD  insulin lispro (HUMALOG KWIKPEN) 100 UNIT/ML KiwkPen Inject 0.03 mLs (3 Units total) into the skin 3 (three) times daily with meals. 08/17/13   Belkys A Regalado, MD  methylcellulose (ARTIFICIAL TEARS) 1 % ophthalmic solution Place 1 drop into both eyes at bedtime as needed (dry eyes).    Historical Provider, MD  metolazone (ZAROXOLYN) 5 MG tablet Take 1 tablet (5 mg total) by mouth daily as needed. 08/17/13   Belkys A Regalado, MD  metoprolol succinate (TOPROL-XL) 25 MG 24 hr tablet Take 1 tablet (25 mg total) by mouth daily. 07/08/13   Alycia Rossetti, MD  potassium chloride SA (K-DUR,KLOR-CON) 20 MEQ tablet Take 1 tablet (20 mEq total) by mouth 2 (two) times daily. 08/17/13   Belkys A Regalado, MD  pravastatin (PRAVACHOL) 20 MG tablet Take 1 tablet (20 mg total) by mouth daily at 6 PM. 08/17/13   Belkys A Regalado, MD  RELION SHORT PEN NEEDLES 31G X 8 MM MISC USE WITH INSULIN PENS (HUMALOG AND LEVEMIR) AS DIRECTED    Susy Frizzle, MD  torsemide (DEMADEX) 20 MG tablet Take  2 tablets (40 mg total) by mouth 2 (two) times daily. 08/17/13   Belkys A Regalado, MD   BP 94/32  Pulse 116  Temp(Src) 97.5 F (36.4 C) (Oral)  Resp 18  Wt 280 lb (127.007 kg)  SpO2 98%  LMP 07/29/2013 Physical Exam  Nursing note and vitals reviewed. Constitutional: She is oriented to person, place, and time. She appears well-developed and well-nourished. No distress.  HENT:  Head: Normocephalic and atraumatic.  Mouth/Throat: Oropharynx is clear and moist.  Neck: Normal range of motion. Neck supple.  Cardiovascular: Normal rate and regular rhythm.  Exam reveals no gallop and no friction rub.   No murmur heard. Pulmonary/Chest: Effort normal and breath sounds normal. No respiratory distress. She has no wheezes.  Abdominal: Soft. Bowel sounds are normal. She exhibits no distension. There is no tenderness.  Musculoskeletal: Normal range of motion. She exhibits edema. She exhibits no tenderness.  Neurological: She is alert and oriented to person, place, and time.  Skin: Skin is warm and dry. She is not diaphoretic.    ED Course  Procedures (including critical care time) Labs Review Labs Reviewed  CBC - Abnormal; Notable for the following:    RBC 3.60 (*)    Hemoglobin 10.6 (*)    HCT 33.2 (*)    Platelets 441 (*)    All other components within normal limits  I-STAT TROPOININ, ED - Abnormal; Notable for the following:    Troponin i, poc 0.90 (*)    All other components within normal limits  COMPREHENSIVE METABOLIC PANEL  URINALYSIS, ROUTINE W REFLEX MICROSCOPIC    Imaging Review No results found.   Date: 09/04/2013  Rate: 70  Rhythm: normal sinus rhythm  QRS Axis: normal  Intervals: normal  ST/T Wave abnormalities: nonspecific T wave changes  Conduction Disutrbances:none  Narrative Interpretation:   Old EKG Reviewed: unchanged    MDM   Final diagnoses:  None   Patient sent for admission by Dr. Martinique after having an abnormal lexiscan. Workup reveals mildly  elevated troponin, however she does have underlying renal insufficiency. Her creatinine is 4.4, consistent with baseline. I have consult cardiology and she will be admitted to their service.    Veryl Speak, MD 09/04/13 [phone]

## 2013-09-04 ENCOUNTER — Encounter (HOSPITAL_COMMUNITY)
Admission: EM | Disposition: A | Discharge status: Home Health | Payer: Self-pay | Source: Home / Self Care | Attending: Interventional Cardiology

## 2013-09-04 ENCOUNTER — Other Ambulatory Visit: Payer: Self-pay

## 2013-09-04 ENCOUNTER — Encounter (HOSPITAL_COMMUNITY): Payer: Self-pay | Admitting: *Deleted

## 2013-09-04 ENCOUNTER — Other Ambulatory Visit: Payer: Self-pay | Admitting: *Deleted

## 2013-09-04 DIAGNOSIS — I2510 Atherosclerotic heart disease of native coronary artery without angina pectoris: Secondary | ICD-10-CM

## 2013-09-04 HISTORY — PX: LEFT HEART CATHETERIZATION WITH CORONARY ANGIOGRAM: SHX5451

## 2013-09-04 LAB — CBC
HCT: 31.5 % — ABNORMAL LOW (ref 36.0–46.0)
Hemoglobin: 9.8 g/dL — ABNORMAL LOW (ref 12.0–15.0)
MCH: 28.7 pg (ref 26.0–34.0)
MCHC: 31.1 g/dL (ref 30.0–36.0)
MCV: 92.4 fL (ref 78.0–100.0)
PLATELETS: 450 10*3/uL — AB (ref 150–400)
RBC: 3.41 MIL/uL — ABNORMAL LOW (ref 3.87–5.11)
RDW: 13.7 % (ref 11.5–15.5)
WBC: 6.8 10*3/uL (ref 4.0–10.5)

## 2013-09-04 LAB — GLUCOSE, CAPILLARY
GLUCOSE-CAPILLARY: 93 mg/dL (ref 70–99)
GLUCOSE-CAPILLARY: 166 mg/dL — AB (ref 70–99)
Glucose-Capillary: 66 mg/dL — ABNORMAL LOW (ref 70–99)
Glucose-Capillary: 65 mg/dL — ABNORMAL LOW (ref 70–99)
Glucose-Capillary: 89 mg/dL (ref 70–99)
Glucose-Capillary: 52 mg/dL — ABNORMAL LOW (ref 70–99)
Glucose-Capillary: 102 mg/dL — ABNORMAL HIGH (ref 70–99)
Glucose-Capillary: 65 mg/dL — ABNORMAL LOW (ref 70–99)
Glucose-Capillary: 145 mg/dL — ABNORMAL HIGH (ref 70–99)

## 2013-09-04 LAB — COMPREHENSIVE METABOLIC PANEL
ALT: 15 U/L (ref 0–35)
AST: 12 U/L (ref 0–37)
Albumin: 1.9 g/dL — ABNORMAL LOW (ref 3.5–5.2)
Alkaline Phosphatase: 64 U/L (ref 39–117)
BUN: 64 mg/dL — ABNORMAL HIGH (ref 6–23)
CO2: 27 mEq/L (ref 19–32)
Calcium: 7.8 mg/dL — ABNORMAL LOW (ref 8.4–10.5)
Chloride: 100 mEq/L (ref 96–112)
Creatinine, Ser: 4.38 mg/dL — ABNORMAL HIGH (ref 0.50–1.10)
GFR calc non Af Amer: 11 mL/min — ABNORMAL LOW (ref >90)
GFR, EST AFRICAN AMERICAN: 13 mL/min — AB (ref >90)
GLUCOSE: 122 mg/dL — AB (ref 70–99)
POTASSIUM: 3.4 meq/L — AB (ref 3.7–5.3)
SODIUM: 142 meq/L (ref 137–147)
TOTAL PROTEIN: 5.3 g/dL — AB (ref 6.0–8.3)
Total Bilirubin: <0.2 mg/dL — ABNORMAL LOW (ref 0.3–1.2)

## 2013-09-04 LAB — TROPONIN I
Troponin I: 0.7 ng/mL (ref <0.30)
Troponin I: 0.45 ng/mL (ref <0.30)

## 2013-09-04 LAB — POCT ACTIVATED CLOTTING TIME: Activated Clotting Time: 99 seconds

## 2013-09-04 LAB — HEPARIN LEVEL (UNFRACTIONATED): HEPARIN UNFRACTIONATED: 0.1 [IU]/mL — AB (ref 0.30–0.70)

## 2013-09-04 SURGERY — LEFT HEART CATHETERIZATION WITH CORONARY ANGIOGRAM
Anesthesia: LOCAL

## 2013-09-04 MED ORDER — HEPARIN BOLUS VIA INFUSION
2700.0000 [IU] | Freq: Once | INTRAVENOUS | Status: AC
  Administered 2013-09-04: 2700 [IU] via INTRAVENOUS
  Filled 2013-09-04: qty 2700

## 2013-09-04 MED ORDER — FUROSEMIDE 10 MG/ML IJ SOLN
INTRAMUSCULAR | Status: AC
  Filled 2013-09-04: qty 8

## 2013-09-04 MED ORDER — GLUCOSE 40 % PO GEL
ORAL | Status: AC
  Filled 2013-09-04: qty 1

## 2013-09-04 MED ORDER — NITROGLYCERIN 0.2 MG/ML ON CALL CATH LAB
INTRAVENOUS | Status: AC
  Filled 2013-09-04: qty 1

## 2013-09-04 MED ORDER — LIDOCAINE HCL (PF) 1 % IJ SOLN
INTRAMUSCULAR | Status: AC
  Filled 2013-09-04: qty 30

## 2013-09-04 MED ORDER — MIDAZOLAM HCL 2 MG/2ML IJ SOLN
INTRAMUSCULAR | Status: AC
  Filled 2013-09-04: qty 2

## 2013-09-04 MED ORDER — HEPARIN (PORCINE) IN NACL 2-0.9 UNIT/ML-% IJ SOLN
INTRAMUSCULAR | Status: AC
  Filled 2013-09-04: qty 500

## 2013-09-04 MED ORDER — FUROSEMIDE 10 MG/ML IJ SOLN
80.0000 mg | Freq: Two times a day (BID) | INTRAMUSCULAR | Status: DC
  Administered 2013-09-04 – 2013-09-06 (×4): 80 mg via INTRAVENOUS
  Filled 2013-09-04 (×5): qty 8

## 2013-09-04 MED ORDER — ASPIRIN 81 MG PO CHEW
81.0000 mg | CHEWABLE_TABLET | ORAL | Status: AC
  Administered 2013-09-04: 81 mg via ORAL
  Filled 2013-09-04: qty 1

## 2013-09-04 MED ORDER — HEPARIN (PORCINE) IN NACL 2-0.9 UNIT/ML-% IJ SOLN
INTRAMUSCULAR | Status: AC
  Filled 2013-09-04: qty 1000

## 2013-09-04 MED ORDER — HEPARIN (PORCINE) IN NACL 100-0.45 UNIT/ML-% IJ SOLN
1950.0000 [IU]/h | INTRAMUSCULAR | Status: DC
  Administered 2013-09-04: 1600 [IU]/h via INTRAVENOUS
  Administered 2013-09-05: 2150 [IU]/h via INTRAVENOUS
  Administered 2013-09-05 (×2): 1850 [IU]/h via INTRAVENOUS
  Administered 2013-09-06 (×2): 2150 [IU]/h via INTRAVENOUS
  Administered 2013-09-07: 1950 [IU]/h via INTRAVENOUS
  Administered 2013-09-07: 2150 [IU]/h via INTRAVENOUS
  Administered 2013-09-08: 1950 [IU]/h via INTRAVENOUS
  Filled 2013-09-04 (×13): qty 250

## 2013-09-04 MED ORDER — FENTANYL CITRATE 0.05 MG/ML IJ SOLN
INTRAMUSCULAR | Status: AC
  Filled 2013-09-04: qty 2

## 2013-09-04 MED ORDER — SODIUM CHLORIDE 0.9 % IV SOLN: INTRAVENOUS | Status: AC

## 2013-09-04 MED ORDER — GLUCOSE 40 % PO GEL
1.0000 | ORAL | Status: DC | PRN
  Administered 2013-09-04: 37.5 g via ORAL

## 2013-09-04 MED ORDER — DEXTROSE 50 % IV SOLN
25.0000 mL | Freq: Once | INTRAVENOUS | Status: AC | PRN
  Administered 2013-09-04: 25 mL via INTRAVENOUS
  Filled 2013-09-04: qty 50

## 2013-09-04 NOTE — Progress Notes (Signed)
ANTICOAGULATION CONSULT NOTE - Follow Up Consult  Pharmacy Consult for Heparin Indication: chest pain/ACS  Allergies  Allergen Reactions  . Crestor [Rosuvastatin] Other (See Comments)    Severe muscle weakness  . Nsaids Other (See Comments)    Not allergic, "bad on my kidneys"  . Ciprofloxacin Rash    Patient Measurements: Height: 5' 7.5" (171.5 cm) Weight: 274 lb 1.6 oz (124.331 kg) IBW/kg (Calculated) : 62.75 Heparin Dosing Weight: 91 kg  Vital Signs: Temp: 98.3 F (36.8 C) (05/19 0500) BP: 124/69 mmHg (05/19 0500) Pulse Rate: 106 (05/19 0500)  Labs:  Recent Labs  09/03/13 1352 09/03/13 1908 09/04/13 0143 09/04/13 0400 09/04/13 0825  HGB 10.6*  --   --  9.8*  --   HCT 33.2*  --   --  31.5*  --   PLT 441*  --   --  450*  --   APTT  --  32  --   --   --   LABPROT  --  13.9  --   --   --   INR  --  1.09  --   --   --   HEPARINUNFRC  --   --   --   --  0.10*  CREATININE 4.46*  --   --  4.38*  --   TROPONINI  --  0.45* 0.70*  --  0.45*    Estimated Creatinine Clearance: 22.4 ml/min (by C-G formula based on Cr of 4.38).   Medications:  Infusions:  . heparin 1,250 Units/hr (09/03/13 2124)  .  sodium bicarbonate 150 mEq in sterile water 1000 mL infusion 65 mL/hr at 09/03/13 1925    Assessment: 39 YOF with severe renal dysfunction with elevated troponin and EF 22% with stress test concerning for ischemia in need of cath on IV heparin.  Heparin level is sub-therapeutic at 0.1 on 1250 units/hr. CBC low-stable. No bleeding reported.   Goal of Therapy:  Heparin level 0.3-0.7 units/ml Monitor platelets by anticoagulation protocol: Yes   Plan:  1. Heparin bolus 2700 units, then drip at 1450 units/hr.  2. Heparin level in 8 hours or post-cath.   Sloan Leiter, PharmD, BCPS Clinical Pharmacist [phone] 09/04/2013,9:33 AM

## 2013-09-04 NOTE — Interval H&P Note (Signed)
History and Physical Interval Note:  09/04/2013 1:14 PM  Hannah [name]  has presented today for surgery, with the diagnosis of cp  The various methods of treatment have been discussed with the patient and family. After consideration of risks, benefits and other options for treatment, the patient has consented to  Procedure(s): LEFT HEART CATHETERIZATION WITH CORONARY ANGIOGRAM (N/A) as a surgical intervention .  The patient's history has been reviewed, patient examined, no change in status, stable for surgery.  I have reviewed the patient's chart and labs.  Questions were answered to the patient's satisfaction.   Cath Lab Visit (complete for each Cath Lab visit)  Clinical Evaluation Leading to the Procedure:   ACS: yes  Non-ACS:    Anginal Classification: CCS IV  Anti-ischemic medical therapy: Maximal Therapy (2 or more classes of medications)  Non-Invasive Test Results: High-risk stress test findings: cardiac mortality >3%/year  Prior CABG: No previous CABG        Ander Slade [facility] 09/04/2013 1:14 PM]

## 2013-09-04 NOTE — Progress Notes (Signed)
UR Completed Jestina Stephani Graves-Bigelow, RN,BSN [phone]  

## 2013-09-04 NOTE — Progress Notes (Signed)
Subjective:  Patient is resting - cardiac cath planned for today.  BP good- UOP not recorded - creatinine stable this AM Objective Vital signs in last 24 hours: Filed Vitals:   09/03/13 1915 09/03/13 1930 09/03/13 2100 09/04/13 0500  BP: 125/99 129/63 113/76 124/69  Pulse: 103 102 114 106  Temp:   97.6 F (36.4 C) 98.3 F (36.8 C)  TempSrc:      Resp:   18 18  Height:  5' 6.93" (1.7 m) 5' 7.5" (1.715 m)   Weight:   124.966 kg (275 lb 8 oz) 124.331 kg (274 lb 1.6 oz)  SpO2: 97% 98% 100% 98%   Weight change:   Intake/Output Summary (Last 24 hours) at 09/04/13 1015 Last data filed at 09/04/13 0500  Gross per 24 hour  Intake    240 ml  Output      3 ml  Net    237 ml    Assessment/ Plan: Pt is a 43 y.o. yo [name] who was admitted on 09/03/2013 with abnormal stress test in the setting of pre op for AVF- plan is for heart cath today  Assessment/Plan: 1. Renal- advanced CKD at baseline followed by Dr. Marval Regal  due to DM- multiple hospitalizations of late.  Now with abnormal stress test and worsened EF felt to need cardiac cath.  Precautions were discussed (limit absolute amount of contrast, hydration and mucomyst) will follow closely after cath.  If she requires HD this hosp will need catheter 2. Cards- as above, cardiac cath today-  On toprol and zocor 3. Anemia- hgb 9.8- will check iron stores and add aranesp 4. Secondary hyperparathyroidism- will check PTH and phos and treat as needed 5. HTN/volume- blood pressure is quite good.  Gentle hydration for contrast- I agree is volume overloaded and will try to work on that after cath is done  [employer]: Basic Metabolic Panel:  Recent Labs Lab 09/03/13 1352 09/04/13 0400  NA 141 142  K 3.9 3.4*  CL 100 100  CO2 25 27  GLUCOSE 172* 122*  BUN 66* 64*  CREATININE 4.46* 4.Hannah*  CALCIUM 8.0* 7.8*   Liver Function Tests:  Recent Labs Lab 09/03/13 1352 09/04/13 0400  AST 13 12  ALT 16 15  ALKPHOS 68 64   BILITOT <0.2* <0.2*  PROT 5.5* 5.3*  ALBUMIN 2.0* 1.9*   No results found for this basename: LIPASE, AMYLASE,  in the last 168 hours No results found for this basename: AMMONIA,  in the last 168 hours CBC:  Recent Labs Lab 09/03/13 1352 09/04/13 0400  WBC 9.4 6.8  HGB 10.6* 9.8*  HCT 33.2* 31.5*  MCV 92.2 92.4  PLT 441* 450*   Cardiac Enzymes:  Recent Labs Lab 09/03/13 1908 09/04/13 0143 09/04/13 0825  TROPONINI 0.45* 0.70* 0.45*   CBG:  Recent Labs Lab 09/03/13 2010 09/04/13 0747 09/04/13 0857  GLUCAP 130* 66* 65*    Iron Studies: No results found for this basename: IRON, TIBC, TRANSFERRIN, FERRITIN,  in the last 72 hours Studies/Results: No results found. Medications: Infusions: . heparin 1,450 Units/hr (09/04/13 0951)  .  sodium bicarbonate 150 mEq in sterile water 1000 mL infusion 65 mL/hr at 09/03/13 1925    Scheduled Medications: . acetylcysteine  1,200 mg Oral BID  . aspirin EC  81 mg Oral Daily  . cholecalciferol  1,000 Units Oral Daily  . insulin aspart  3 Units Subcutaneous TID WC  . insulin detemir  34 Units Subcutaneous  QHS  . metoprolol succinate  25 mg Oral Daily  . simvastatin  10 mg Oral q1800  . sodium chloride  3 mL Intravenous Q12H    have reviewed scheduled and prn medications.  Physical Exam: General: NAD Heart: RRR- slightly tachy Lungs:mostly clear Abdomen: obese, soft, non tender Extremities: pitting edema    09/04/2013,10:15 AM  LOS: 1 day

## 2013-09-04 NOTE — Progress Notes (Signed)
ANTICOAGULATION CONSULT NOTE - Follow Up Consult  Pharmacy Consult for Heparin Indication: chest pain/ACS  Allergies  Allergen Reactions  . Crestor [Rosuvastatin] Other (See Comments)    Severe muscle weakness  . Nsaids Other (See Comments)    Not allergic, "bad on my kidneys"  . Ciprofloxacin Rash    Patient Measurements: Height: 5' 7.5" (171.5 cm) Weight: 274 lb 1.6 oz (124.331 kg) IBW/kg (Calculated) : 62.75 Heparin Dosing Weight: 91 kg  Vital Signs: Temp: 98.3 F (36.8 C) (05/19 0500) BP: 106/89 mmHg (05/19 1545) Pulse Rate: 105 (05/19 1545)  Labs:  Recent Labs  09/03/13 1352 09/03/13 1908 09/04/13 0143 09/04/13 0400 09/04/13 0825  HGB 10.6*  --   --  9.8*  --   HCT 33.2*  --   --  31.5*  --   PLT 441*  --   --  450*  --   APTT  --  32  --   --   --   LABPROT  --  13.9  --   --   --   INR  --  1.09  --   --   --   HEPARINUNFRC  --   --   --   --  0.10*  CREATININE 4.46*  --   --  4.38*  --   TROPONINI  --  0.45* 0.70*  --  0.45*    Estimated Creatinine Clearance: 22.4 ml/min (by C-G formula based on Cr of 4.38).   Medications:  Infusions:  . sodium chloride    . heparin    .  sodium bicarbonate 150 mEq in sterile water 1000 mL infusion 65 mL/hr at 09/03/13 1925    Assessment: 6 YOF with severe renal dysfunction with elevated troponin and EF 22% with stress test concerning for ischemia in need of cath on IV heparin.  Heparin level is sub-therapeutic at 0.1 on 1250 units/hr. CBC low-stable. No bleeding reported.   Severe 3 vessel CAD found on cardiac cath.  Heparin to be resumed.  Consideration being given for CABG.  Heparin to be resumed 8 hours after sheath removed (2200 today)  Goal of Therapy:  Heparin level 0.3-0.7 units/ml Monitor platelets by anticoagulation protocol: Yes   Plan:  1. Resume heparin at 1600 units/hr 2. Check a heparin level with AM labs.  Heide Guile, PharmD, BCPS Clinical Pharmacist Pager [phone]   09/04/2013,4:05  PM

## 2013-09-04 NOTE — H&P (View-Only) (Signed)
Patient Name: Hannah [name] Date of Encounter: 09/04/2013     Principal Problem:   Intermediate coronary syndrome Active Problems:   Nephrotic syndrome   Type II or unspecified type diabetes mellitus with neurological manifestations, not stated as uncontrolled   Hyperlipidemia   Essential hypertension, benign   Normocytic anemia   Chronic kidney disease (CKD), stage V   CHF (congestive heart failure)   CAD (coronary artery disease)    SUBJECTIVE Some shortness of breath overnight, improved compare to during last admission over a wk ago. Denies any chest pain. LE edema stable. No significant issue overnight per nursing staff. Blood glucose low this am.   CURRENT MEDS . acetylcysteine  1,200 mg Oral BID  . aspirin EC  81 mg Oral Daily  . cholecalciferol  1,000 Units Oral Daily  . insulin aspart  3 Units Subcutaneous TID WC  . insulin detemir  34 Units Subcutaneous QHS  . metoprolol succinate  25 mg Oral Daily  . simvastatin  10 mg Oral q1800  . sodium chloride  3 mL Intravenous Q12H    OBJECTIVE  Filed Vitals:   09/03/13 1915 09/03/13 1930 09/03/13 2100 09/04/13 0500  BP: 125/99 129/63 113/76 124/69  Pulse: 103 102 114 106  Temp:   97.6 F (36.4 C) 98.3 F (36.8 C)  TempSrc:      Resp:   18 18  Height:  5' 6.93" (1.7 m) 5' 7.5" (1.715 m)   Weight:   275 lb 8 oz (124.966 kg) 274 lb 1.6 oz (124.331 kg)  SpO2: 97% 98% 100% 98%    Intake/Output Summary (Last 24 hours) at 09/04/13 0858 Last data filed at 09/04/13 0500  Gross per 24 hour  Intake    240 ml  Output      3 ml  Net    237 ml   Filed Weights   09/03/13 1345 09/03/13 2100 09/04/13 0500  Weight: 280 lb (127.007 kg) 275 lb 8 oz (124.966 kg) 274 lb 1.6 oz (124.331 kg)    PHYSICAL EXAM  General: Pleasant, NAD. Neuro: Alert and oriented X 3. Moves all extremities spontaneously. Psych: Normal affect. HEENT:  R eye blind, otherwise normal  Neck: Supple without bruits or JVD. Lungs:  Resp regular  and unlabored, decreased breath sound in bilateral basis. Otherwise clear in other lung field. Heart: RRR no s3, s4, or murmurs. Abdomen: Soft, non-tender, non-distended, BS + x 4.  Extremities: No clubbing, cyanosis. DP/PT/Radials 2+ and equal bilaterally. Trace edema in bilateral LE  Accessory Clinical Findings  CBC  Recent Labs  09/03/13 1352 09/04/13 0400  WBC 9.4 6.8  HGB 10.6* 9.8*  HCT 33.2* 31.5*  MCV 92.2 92.4  PLT 441* 778*   Basic Metabolic Panel  Recent Labs  09/03/13 1352 09/04/13 0400  NA 141 142  K 3.9 3.4*  CL 100 100  CO2 25 27  GLUCOSE 172* 122*  BUN 66* 64*  CREATININE 4.46* 4.38*  CALCIUM 8.0* 7.8*   Liver Function Tests  Recent Labs  09/03/13 1352 09/04/13 0400  AST 13 12  ALT 16 15  ALKPHOS 68 64  BILITOT <0.2* <0.2*  PROT 5.5* 5.3*  ALBUMIN 2.0* 1.9*   No results found for this basename: LIPASE, AMYLASE,  in the last 72 hours Cardiac Enzymes  Recent Labs  09/03/13 1908 09/04/13 0143  TROPONINI 0.45* 0.70*    TELE  Sinus tachycardia since admission with HR in 100-110s. No significant ventricular ectopy.  ECG 5/19: sinus  tachy with HR in 100s, poor R wave progression in anterior lead.  Radiology/Studies  Ct Chest Wo Contrast  08/22/2013   CLINICAL DATA:  Left lung cancer status post lobectomy. Weight gain. Renal insufficiency.  EXAM: CT CHEST WITHOUT CONTRAST  TECHNIQUE: Multidetector CT imaging of the chest was performed following the standard protocol without IV contrast.  COMPARISON:  Radiographs 08/13/2013 and 08/04/2013.  CT 08/02/2012.  FINDINGS: There are stable postsurgical changes related to prior left lower lobe resection. There has been interval enlargement of several mediastinal lymph nodes. These include 12 mm right paratracheal (image 19), 13 mm precarinal (image 23), and 14 mm AP window (image 25) lymph nodes. Some of these nodes have retained fatty hila. Allowing for the limitations of noncontrast technique, the  hila appear stable.  There is stable low-density within the right thyroid lobe. Atherosclerosis of the aorta, great vessels and coronary arteries is noted. The heart size is normal. There is no pericardial effusion.  Moderate size dependent pleural effusions are present bilaterally. There is associated dependent airspace disease in both lower lobes. In addition, there are more focal airspace opacities within the right lower lobe and inferior aspect of the left upper lobe. The latter is somewhat nodular, measuring up to 1.8 cm on image 43. No other focal nodularity or endobronchial lesions are demonstrated.  The visualized upper abdomen has a stable appearance. There is no adrenal mass. There is increased subcutaneous edema throughout the subcutaneous fat, especially within the anterior aspect of the upper abdomen.  No worrisome osseous findings are demonstrated.  IMPRESSION: 1. As demonstrated radiographically, there are bilateral pleural effusions and bilateral airspace opacities which are new compared with the prior CT. Although there are focal somewhat nodular components in the right lower and left upper lobes, these findings are most likely infectious/inflammatory. 2. Progressive mediastinal lymphadenopathy. Some of the nodes have retained fatty hila and may be reactive. Metastatic disease cannot be completely excluded. 3. Stable atherosclerosis and low-density within the right thyroid lobe. 4. If the airspace opacities and pleural effusions fail to respond to appropriate clinical therapy, follow-up CT or PET-CT may be warranted to exclude metastatic disease.   Electronically Signed   By: Camie Patience M.D.   On: 08/22/2013 10:26   Dg Chest Portable 1 View  08/13/2013   CLINICAL DATA:  Shortness of breath increasing for 2 weeks. History of CHF.  EXAM: PORTABLE CHEST - 1 VIEW  COMPARISON:  08/04/2013  FINDINGS: Again noted are bibasilar lung densities that are suggestive for pleural effusions and atelectasis.  Heart size appears to be enlarged. There is some peribronchial thickening and cannot exclude pulmonary edema.  IMPRESSION: Bilateral pleural effusions with basilar atelectasis. There is mild pulmonary edema. Minimal change from the previous examination.   Electronically Signed   By: Markus Daft M.D.   On: 08/13/2013 23:10    ASSESSMENT AND PLAN  43 yo morbidly obese female with PMH of stage V kidney disease currently being considered for a-v fistula placement and CAD without stent who was recently discharged for heart failure symptom had a high risk stress test which showed ischemia in anterior and apical region.  1. High risk stress test concerning for anterior ischemia  - need cardiac catheterization, discussed with patient regarding very high risk for contrast nephropathy  - given her possible future carotid surgery and a-v fistula placement, ?if need BMS  - Discussed with patient regarding benefit and risk of cardiac catheterization, she display clear understanding and agree to proceed  - ?  given patient's dyspnea whether she can lay flat on cath table for a hr (will ask nurse to lay patient flat prior to cath to check)  - Cr only mildly improved with overnight hydration, will discuss with Dr. Irish Lack to see if patient can undergo cath today vs tomorrow   2. Acute systolic dysfunction 2/2 likely ischemic cardiomyopathy  - Echo 08/10/2012 55-60%  - Nuc stress 08/2013 EF 22%  3. Elevated troponin  - likely due to a combination of CKD and underlying ischemia  4. Stage V CKD  - nephrology consulted, recommended soft fluid hydration  - recommended limit contrast dye, use iodixanol if possible, added Mucomyst to 1200mg  BID, and potentially require tunneled HD cath if need dialysis  5. Normocytic Anemia  - appears to be chronic, 2/2 CKD  - changed from 10.6 to 9.8, continue to monitor  - patient denies any bleeding problem  6. Tachycardia  - likely related to low EF- improved today  7.  Carotid stenosis  - may need carotid intervention in the future  Signed, Almyra Deforest PA  I have examined the patient and reviewed assessment and plan and discussed with patient.  Agree with above as stated.  High risk stress test.  Plan for cath today.  No V gram.  Nephrology on board.  If dialysis needed, will need catheter for dialysis.  AV fistula was planned.  Vasc surgery also working up for  carotid disease.  Jettie Booze

## 2013-09-04 NOTE — CV Procedure (Signed)
   Cardiac Catheterization Procedure Note  Name: Hannah [name] MRN: [id number] DOB: 1971-02-15  Procedure: Left Heart Cath, Selective Coronary Angiography  Indication: 43 yo WF with history of CAD, IDDM, CKD stage 4 presents with NSTEMI and acute systolic CHF. Myoview study is high risk with multiple perfusion abnormalities and EF of 22%.    Procedural details: The right groin was prepped, draped, and anesthetized with 1% lidocaine. Using modified Seldinger technique, a 5 French sheath was introduced into the right femoral artery. Standard Judkins catheters were used for coronary angiography and left ventriculography. Catheter exchanges were performed over a guidewire. There were no immediate procedural complications. The patient was transferred to the post catheterization recovery area for further monitoring. 25 cc of contrast was used.   Procedural Findings: Hemodynamics:  AO 130/74 mean 97 LV 130/38 mm Hg   Coronary angiography: Coronary dominance: right  Left mainstem: Short, normal.   Left anterior descending (LAD):  80% ostial stenosis. 70-80% mid LAD. The mid LAD is diffusely diseased. The first diagonal is occluded. The second diagonal is very small and occluded.  Left circumflex (LCx): The LCx is diffusely diseased in the mid vessel to 50%. The first OM is relatively small with diffuse 90% proximal. The second OM is larger with 50% disease.  Right coronary artery (RCA): 100% at the ostium. Left to right collaterals to the distal RCA  Left ventriculography: Not done  Final Conclusions:   1. Severe 3 vessel obstructive CAD. This has progressed from prior study in 2013. 2. Elevated LV filling pressures.  Recommendations: recommend consideration for CABG. Needs IV diuresis.   Ander Slade [facility] 09/04/2013, 1:55 PM

## 2013-09-04 NOTE — Progress Notes (Signed)
Patient Name: Hannah [name] Date of Encounter: 09/04/2013     Principal Problem:   Intermediate coronary syndrome Active Problems:   Nephrotic syndrome   Type II or unspecified type diabetes mellitus with neurological manifestations, not stated as uncontrolled   Hyperlipidemia   Essential hypertension, benign   Normocytic anemia   Chronic kidney disease (CKD), stage V   CHF (congestive heart failure)   CAD (coronary artery disease)    SUBJECTIVE Some shortness of breath overnight, improved compare to during last admission over a wk ago. Denies any chest pain. LE edema stable. No significant issue overnight per nursing staff. Blood glucose low this am.   CURRENT MEDS . acetylcysteine  1,200 mg Oral BID  . aspirin EC  81 mg Oral Daily  . cholecalciferol  1,000 Units Oral Daily  . insulin aspart  3 Units Subcutaneous TID WC  . insulin detemir  34 Units Subcutaneous QHS  . metoprolol succinate  25 mg Oral Daily  . simvastatin  10 mg Oral q1800  . sodium chloride  3 mL Intravenous Q12H    OBJECTIVE  Filed Vitals:   09/03/13 1915 09/03/13 1930 09/03/13 2100 09/04/13 0500  BP: 125/99 129/63 113/76 124/69  Pulse: 103 102 114 106  Temp:   97.6 F (36.4 C) 98.3 F (36.8 C)  TempSrc:      Resp:   18 18  Height:  5' 6.93" (1.7 m) 5' 7.5" (1.715 m)   Weight:   275 lb 8 oz (124.966 kg) 274 lb 1.6 oz (124.331 kg)  SpO2: 97% 98% 100% 98%    Intake/Output Summary (Last 24 hours) at 09/04/13 0858 Last data filed at 09/04/13 0500  Gross per 24 hour  Intake    240 ml  Output      3 ml  Net    237 ml   Filed Weights   09/03/13 1345 09/03/13 2100 09/04/13 0500  Weight: 280 lb (127.007 kg) 275 lb 8 oz (124.966 kg) 274 lb 1.6 oz (124.331 kg)    PHYSICAL EXAM  General: Pleasant, NAD. Neuro: Alert and oriented X 3. Moves all extremities spontaneously. Psych: Normal affect. HEENT:  R eye blind, otherwise normal  Neck: Supple without bruits or JVD. Lungs:  Resp regular  and unlabored, decreased breath sound in bilateral basis. Otherwise clear in other lung field. Heart: RRR no s3, s4, or murmurs. Abdomen: Soft, non-tender, non-distended, BS + x 4.  Extremities: No clubbing, cyanosis. DP/PT/Radials 2+ and equal bilaterally. Trace edema in bilateral LE  Accessory Clinical Findings  CBC  Recent Labs  09/03/13 1352 09/04/13 0400  WBC 9.4 6.8  HGB 10.6* 9.8*  HCT 33.2* 31.5*  MCV 92.2 92.4  PLT 441* 614*   Basic Metabolic Panel  Recent Labs  09/03/13 1352 09/04/13 0400  NA 141 142  K 3.9 3.4*  CL 100 100  CO2 25 27  GLUCOSE 172* 122*  BUN 66* 64*  CREATININE 4.46* 4.38*  CALCIUM 8.0* 7.8*   Liver Function Tests  Recent Labs  09/03/13 1352 09/04/13 0400  AST 13 12  ALT 16 15  ALKPHOS 68 64  BILITOT <0.2* <0.2*  PROT 5.5* 5.3*  ALBUMIN 2.0* 1.9*   No results found for this basename: LIPASE, AMYLASE,  in the last 72 hours Cardiac Enzymes  Recent Labs  09/03/13 1908 09/04/13 0143  TROPONINI 0.45* 0.70*    TELE  Sinus tachycardia since admission with HR in 100-110s. No significant ventricular ectopy.  ECG 5/19: sinus  tachy with HR in 100s, poor R wave progression in anterior lead.  Radiology/Studies  Ct Chest Wo Contrast  08/22/2013   CLINICAL DATA:  Left lung cancer status post lobectomy. Weight gain. Renal insufficiency.  EXAM: CT CHEST WITHOUT CONTRAST  TECHNIQUE: Multidetector CT imaging of the chest was performed following the standard protocol without IV contrast.  COMPARISON:  Radiographs 08/13/2013 and 08/04/2013.  CT 08/02/2012.  FINDINGS: There are stable postsurgical changes related to prior left lower lobe resection. There has been interval enlargement of several mediastinal lymph nodes. These include 12 mm right paratracheal (image 19), 13 mm precarinal (image 23), and 14 mm AP window (image 25) lymph nodes. Some of these nodes have retained fatty hila. Allowing for the limitations of noncontrast technique, the  hila appear stable.  There is stable low-density within the right thyroid lobe. Atherosclerosis of the aorta, great vessels and coronary arteries is noted. The heart size is normal. There is no pericardial effusion.  Moderate size dependent pleural effusions are present bilaterally. There is associated dependent airspace disease in both lower lobes. In addition, there are more focal airspace opacities within the right lower lobe and inferior aspect of the left upper lobe. The latter is somewhat nodular, measuring up to 1.8 cm on image 43. No other focal nodularity or endobronchial lesions are demonstrated.  The visualized upper abdomen has a stable appearance. There is no adrenal mass. There is increased subcutaneous edema throughout the subcutaneous fat, especially within the anterior aspect of the upper abdomen.  No worrisome osseous findings are demonstrated.  IMPRESSION: 1. As demonstrated radiographically, there are bilateral pleural effusions and bilateral airspace opacities which are new compared with the prior CT. Although there are focal somewhat nodular components in the right lower and left upper lobes, these findings are most likely infectious/inflammatory. 2. Progressive mediastinal lymphadenopathy. Some of the nodes have retained fatty hila and may be reactive. Metastatic disease cannot be completely excluded. 3. Stable atherosclerosis and low-density within the right thyroid lobe. 4. If the airspace opacities and pleural effusions fail to respond to appropriate clinical therapy, follow-up CT or PET-CT may be warranted to exclude metastatic disease.   Electronically Signed   By: Camie Patience M.D.   On: 08/22/2013 10:26   Dg Chest Portable 1 View  08/13/2013   CLINICAL DATA:  Shortness of breath increasing for 2 weeks. History of CHF.  EXAM: PORTABLE CHEST - 1 VIEW  COMPARISON:  08/04/2013  FINDINGS: Again noted are bibasilar lung densities that are suggestive for pleural effusions and atelectasis.  Heart size appears to be enlarged. There is some peribronchial thickening and cannot exclude pulmonary edema.  IMPRESSION: Bilateral pleural effusions with basilar atelectasis. There is mild pulmonary edema. Minimal change from the previous examination.   Electronically Signed   By: Markus Daft M.D.   On: 08/13/2013 23:10    ASSESSMENT AND PLAN  43 yo morbidly obese female with PMH of stage V kidney disease currently being considered for a-v fistula placement and CAD without stent who was recently discharged for heart failure symptom had a high risk stress test which showed ischemia in anterior and apical region.  1. High risk stress test concerning for anterior ischemia  - need cardiac catheterization, discussed with patient regarding very high risk for contrast nephropathy  - given her possible future carotid surgery and a-v fistula placement, ?if need BMS  - Discussed with patient regarding benefit and risk of cardiac catheterization, she display clear understanding and agree to proceed  - ?  given patient's dyspnea whether she can lay flat on cath table for a hr (will ask nurse to lay patient flat prior to cath to check)  - Cr only mildly improved with overnight hydration, will discuss with Dr. Irish Lack to see if patient can undergo cath today vs tomorrow   2. Acute systolic dysfunction 2/2 likely ischemic cardiomyopathy  - Echo 08/10/2012 55-60%  - Nuc stress 08/2013 EF 22%  3. Elevated troponin  - likely due to a combination of CKD and underlying ischemia  4. Stage V CKD  - nephrology consulted, recommended soft fluid hydration  - recommended limit contrast dye, use iodixanol if possible, added Mucomyst to 1200mg  BID, and potentially require tunneled HD cath if need dialysis  5. Normocytic Anemia  - appears to be chronic, 2/2 CKD  - changed from 10.6 to 9.8, continue to monitor  - patient denies any bleeding problem  6. Tachycardia  - likely related to low EF- improved today  7.  Carotid stenosis  - may need carotid intervention in the future  Signed, Almyra Deforest PA  I have examined the patient and reviewed assessment and plan and discussed with patient.  Agree with above as stated.  High risk stress test.  Plan for cath today.  No V gram.  Nephrology on board.  If dialysis needed, will need catheter for dialysis.  AV fistula was planned.  Vasc surgery also working up for  carotid disease.  Jettie Booze

## 2013-09-05 ENCOUNTER — Other Ambulatory Visit: Payer: Medicare Other

## 2013-09-05 ENCOUNTER — Inpatient Hospital Stay (HOSPITAL_COMMUNITY): Payer: Medicare Other

## 2013-09-05 ENCOUNTER — Encounter (HOSPITAL_COMMUNITY): Payer: Medicare Other

## 2013-09-05 ENCOUNTER — Encounter (HOSPITAL_COMMUNITY): Payer: Self-pay | Admitting: Physician Assistant

## 2013-09-05 DIAGNOSIS — I2510 Atherosclerotic heart disease of native coronary artery without angina pectoris: Secondary | ICD-10-CM

## 2013-09-05 DIAGNOSIS — Z01810 Encounter for preprocedural cardiovascular examination: Secondary | ICD-10-CM

## 2013-09-05 DIAGNOSIS — I6529 Occlusion and stenosis of unspecified carotid artery: Secondary | ICD-10-CM

## 2013-09-05 DIAGNOSIS — I319 Disease of pericardium, unspecified: Secondary | ICD-10-CM

## 2013-09-05 DIAGNOSIS — I10 Essential (primary) hypertension: Secondary | ICD-10-CM

## 2013-09-05 LAB — GLUCOSE, CAPILLARY
GLUCOSE-CAPILLARY: 101 mg/dL — AB (ref 70–99)
GLUCOSE-CAPILLARY: 107 mg/dL — AB (ref 70–99)
GLUCOSE-CAPILLARY: 142 mg/dL — AB (ref 70–99)
GLUCOSE-CAPILLARY: 177 mg/dL — AB (ref 70–99)
Glucose-Capillary: 86 mg/dL (ref 70–99)

## 2013-09-05 LAB — CBC
HEMATOCRIT: 30.1 % — AB (ref 36.0–46.0)
Hemoglobin: 9.5 g/dL — ABNORMAL LOW (ref 12.0–15.0)
MCH: 29.6 pg (ref 26.0–34.0)
MCHC: 31.6 g/dL (ref 30.0–36.0)
MCV: 93.8 fL (ref 78.0–100.0)
Platelets: 364 10*3/uL (ref 150–400)
RBC: 3.21 MIL/uL — ABNORMAL LOW (ref 3.87–5.11)
RDW: 13.8 % (ref 11.5–15.5)
WBC: 6.1 10*3/uL (ref 4.0–10.5)

## 2013-09-05 LAB — RENAL FUNCTION PANEL
Albumin: 1.8 g/dL — ABNORMAL LOW (ref 3.5–5.2)
BUN: 62 mg/dL — ABNORMAL HIGH (ref 6–23)
CALCIUM: 7.7 mg/dL — AB (ref 8.4–10.5)
CHLORIDE: 98 meq/L (ref 96–112)
CO2: 27 meq/L (ref 19–32)
Creatinine, Ser: 4.17 mg/dL — ABNORMAL HIGH (ref 0.50–1.10)
GFR calc non Af Amer: 12 mL/min — ABNORMAL LOW (ref >90)
GFR, EST AFRICAN AMERICAN: 14 mL/min — AB (ref >90)
Glucose, Bld: 109 mg/dL — ABNORMAL HIGH (ref 70–99)
POTASSIUM: 3.3 meq/L — AB (ref 3.7–5.3)
Phosphorus: 6.6 mg/dL — ABNORMAL HIGH (ref 2.3–4.6)
SODIUM: 139 meq/L (ref 137–147)

## 2013-09-05 LAB — IRON AND TIBC
IRON: 44 ug/dL (ref 42–135)
SATURATION RATIOS: 20 % (ref 20–55)
TIBC: 218 ug/dL — AB (ref 250–470)
UIBC: 174 ug/dL (ref 125–400)

## 2013-09-05 LAB — HEPARIN LEVEL (UNFRACTIONATED)
HEPARIN UNFRACTIONATED: 0.19 [IU]/mL — AB (ref 0.30–0.70)
Heparin Unfractionated: 0.15 IU/mL — ABNORMAL LOW (ref 0.30–0.70)

## 2013-09-05 LAB — TSH: TSH: 1.24 u[IU]/mL (ref 0.350–4.500)

## 2013-09-05 LAB — SURGICAL PCR SCREEN
MRSA, PCR: NEGATIVE
Staphylococcus aureus: NEGATIVE

## 2013-09-05 LAB — T4, FREE: Free T4: 1.02 ng/dL (ref 0.80–1.80)

## 2013-09-05 LAB — FERRITIN: Ferritin: 35 ng/mL (ref 10–291)

## 2013-09-05 MED ORDER — CALCIUM ACETATE 667 MG PO CAPS
667.0000 mg | ORAL_CAPSULE | Freq: Three times a day (TID) | ORAL | Status: DC
  Administered 2013-09-05 – 2013-09-09 (×9): 667 mg via ORAL
  Filled 2013-09-05 (×14): qty 1

## 2013-09-05 MED ORDER — ALBUTEROL SULFATE (2.5 MG/3ML) 0.083% IN NEBU
2.5000 mg | INHALATION_SOLUTION | Freq: Once | RESPIRATORY_TRACT | Status: AC
  Administered 2013-09-05: 2.5 mg via RESPIRATORY_TRACT

## 2013-09-05 MED ORDER — INSULIN DETEMIR 100 UNIT/ML ~~LOC~~ SOLN
28.0000 [IU] | Freq: Every day | SUBCUTANEOUS | Status: DC
  Administered 2013-09-05 – 2013-09-07 (×2): 28 [IU] via SUBCUTANEOUS
  Filled 2013-09-05 (×4): qty 0.28

## 2013-09-05 MED ORDER — DARBEPOETIN ALFA-POLYSORBATE 60 MCG/0.3ML IJ SOLN
60.0000 ug | INTRAMUSCULAR | Status: DC
  Administered 2013-09-05: 60 ug via SUBCUTANEOUS
  Filled 2013-09-05: qty 0.3

## 2013-09-05 MED ORDER — SODIUM CHLORIDE 0.9 % IV SOLN
125.0000 mg | Freq: Every day | INTRAVENOUS | Status: AC
  Administered 2013-09-05 – 2013-09-06 (×2): 125 mg via INTRAVENOUS
  Filled 2013-09-05 (×4): qty 10

## 2013-09-05 MED ORDER — INSULIN ASPART 100 UNIT/ML ~~LOC~~ SOLN
0.0000 [IU] | Freq: Three times a day (TID) | SUBCUTANEOUS | Status: DC
  Administered 2013-09-05: 1 [IU] via SUBCUTANEOUS
  Administered 2013-09-06: 2 [IU] via SUBCUTANEOUS
  Administered 2013-09-06: 5 [IU] via SUBCUTANEOUS
  Administered 2013-09-07 (×2): 1 [IU] via SUBCUTANEOUS
  Administered 2013-09-07: 5 [IU] via SUBCUTANEOUS
  Administered 2013-09-08: 2 [IU] via SUBCUTANEOUS
  Administered 2013-09-08: 5 [IU] via SUBCUTANEOUS
  Administered 2013-09-08 – 2013-09-09 (×2): 2 [IU] via SUBCUTANEOUS
  Administered 2013-09-09: 5 [IU] via SUBCUTANEOUS
  Administered 2013-09-09: 3 [IU] via SUBCUTANEOUS
  Administered 2013-09-10: 2 [IU] via SUBCUTANEOUS
  Administered 2013-09-10: 5 [IU] via SUBCUTANEOUS
  Administered 2013-09-10: 7 [IU] via SUBCUTANEOUS
  Administered 2013-09-11: 5 [IU] via SUBCUTANEOUS
  Administered 2013-09-11 (×2): 3 [IU] via SUBCUTANEOUS
  Administered 2013-09-12: 2 [IU] via SUBCUTANEOUS
  Administered 2013-09-12: 5 [IU] via SUBCUTANEOUS
  Administered 2013-09-12: 3 [IU] via SUBCUTANEOUS
  Administered 2013-09-13: 7 [IU] via SUBCUTANEOUS

## 2013-09-05 NOTE — Progress Notes (Signed)
[facility] for Heparin Indication: chest pain/ACS  Allergies  Allergen Reactions  . Crestor [Rosuvastatin] Other (See Comments)    Severe muscle weakness  . Nsaids Other (See Comments)    Not allergic, "bad on my kidneys"  . Ciprofloxacin Rash    Patient Measurements: Height: 5' 7.5" (171.5 cm) Weight: 280 lb 6.8 oz (127.2 kg) IBW/kg (Calculated) : 62.75 Heparin Dosing Weight: 91 kg  Vital Signs: Temp: 97.5 F (36.4 C) (05/20 0430) Temp src: Oral (05/20 0430) BP: 109/66 mmHg (05/20 0430) Pulse Rate: 101 (05/20 0430)  Labs:  Recent Labs  09/03/13 1352 09/03/13 1908 09/04/13 0143 09/04/13 0400 09/04/13 0825 09/05/13 0555  HGB 10.6*  --   --  9.8*  --  9.5*  HCT 33.2*  --   --  31.5*  --  30.1*  PLT 441*  --   --  450*  --  364  APTT  --  32  --   --   --   --   LABPROT  --  13.9  --   --   --   --   INR  --  1.09  --   --   --   --   HEPARINUNFRC  --   --   --   --  0.10* 0.15*  CREATININE 4.46*  --   --  4.38*  --  4.17*  TROPONINI  --  0.45* 0.70*  --  0.45*  --     Estimated Creatinine Clearance: 23.8 ml/min (by C-G formula based on Cr of 4.17).   Medications:  Infusions:  . heparin 1,600 Units/hr (09/04/13 2348)    Assessment: 43 yo female with CAD awaiting possible CABG for heparin  Goal of Therapy:  Heparin level 0.3-0.7 units/ml Monitor platelets by anticoagulation protocol: Yes   Plan:  Increase Heparin 1850 units/hr Check heparin level in 8 hours.    Phillis Knack, PharmD, BCPS  09/05/2013,7:45 AM

## 2013-09-05 NOTE — Consult Note (Signed)
[address]       [place] [address]             [phone]        Hannah [name] [facility] Medical Record #[id number] Date of Birth: [birth year]  Referring: No ref. provider found Primary Care: Vic Blackbird, MD  Chief Complaint:    Chief Complaint  Patient presents with  . Shortness of Breath  . Chest Pain   patient examined, coronary injury gram and echocardiogram reviewed  History of Present Illness:      43 year old Caucasian female type I diabetic reformed smoker is admitted to the hospital with ischemic cardiomyopathy, heart failure with low EF and three-vessel CAD documented by coronary angiogram. LVEDP 40 mmHg. Chest x-ray with pulmonary edema. Patients weight 280 pounds, up from 215 pounds. Lower extremities with severe pitting edema. The patient was in the process of getting vascular access to initiate hemodialysis. A preop stress test showed low EF with ischemic changes. Patient has had heart failure symptoms with no angina. She's currently on heparin.   The patient has history of a 90% left carotid stenosis and was being prepared for surgical intervention as well.  The patient had a left lower lobectomy 2 years ago for stage I non-small cell carcinoma. She stopped smoking that time but she has severe COPD with FEV1 900 cc and diffusion capacity only 40% of predicted.  The patient's hemoglobin A1c. Between 10 and 15.1  Current Activity/ Functional Status: Minimal activity tolerated due to class 4 heart failure    Zubrod Score: At the time of surgery this patient's most appropriate activity status/level should be described as: []     0    Normal activity, no symptoms []     1    Restricted in physical strenuous activity but ambulatory, able to do out light work []     2    Ambulatory and capable of self care, unable to do work activities, up and about                 more than 50%  Of the time                            [x]     3    Only  limited self care, in bed greater than 50% of waking hours []     4    Completely disabled, no self care, confined to bed or chair []     5    Moribund  Past Medical History  Diagnosis Date  . High cholesterol   . Diabetic retinopathy   . Peripheral neuropathy     "tips of toes"  . Blind right eye   . CHF (congestive heart failure)   . CAD (coronary artery disease)   . GERD (gastroesophageal reflux disease)   . Hypertension   . History of lung cancer 07/2011    s/p left lower lobectomy  . Diabetes mellitus     IDDM  . Cataract of right eye   . Ulcer of toe of right foot 07/10/2012    great toe  . Breast calcification, left 06/2012  . Acute biphenotypic leukemia   . CKD (chronic kidney disease) stage 5, GFR less than 15 ml/min   . Nephrotic syndrome   . Gastritis     H/o gastritis on prior endoscopy  . Anemia   . Carotid artery disease  Past Surgical History  Procedure Laterality Date  . Cardiac catheterization  07/16/2011  . Incision and drainage breast abscess Left   . Tubal ligation  1994  . Vitrectomy  2010    2 on left, 1 on right  . Cesarean section  1991; 1994  . Video assisted thoracoscopy (vats)/ lobectomy Left 07/30/2011    left main thoracotomy, left lower lobectomy, mediastinal lymph node dissection  . Lobectomy    . Colonoscopy with esophagogastroduodenoscopy (egd) N/A 08/14/2012    Procedure: COLONOSCOPY WITH ESOPHAGOGASTRODUODENOSCOPY (EGD);  Surgeon: Danie Binder, MD;  Location: [facility];  Service: Endoscopy;  Laterality: N/A;  10:45-moved to [address] to notify pt  . Breast lumpectomy with needle localization Left 11/14/2012    Procedure: BREAST LUMPECTOMY WITH NEEDLE LOCALIZATION;  Surgeon: Marcello Moores A. Cornett, MD;  Location: [facility];  Service: General;  Laterality: Left;    History  Smoking status  . Former Smoker -- 2.00 packs/day for 30 years  . Quit date: 08/01/2011  Smokeless tobacco  . Never Used    History  Alcohol Use No     History   Social History  . Marital Status: Married    Spouse Name: N/A    Number of Children: 3  . Years of Education: N/A   Occupational History  . Not on file.   Social History Main Topics  . Smoking status: Former Smoker -- 2.00 packs/day for 30 years    Quit date: 08/01/2011  . Smokeless tobacco: Never Used  . Alcohol Use: No  . Drug Use: No  . Sexual Activity: Yes   Other Topics Concern  . Not on file   Social History Narrative  . No narrative on file    Allergies  Allergen Reactions  . Crestor [Rosuvastatin] Other (See Comments)    Severe muscle weakness  . Nsaids Other (See Comments)    Not allergic, "bad on my kidneys"  . Ciprofloxacin Rash    Current Facility-Administered Medications  Medication Dose Route Frequency Provider Last Rate Last Dose  . 0.9 %  sodium chloride infusion  250 mL Intravenous PRN Evelene Croon Barrett, PA-C 10 mL/hr at 09/05/13 0013 250 mL at 09/05/13 0013  . acetaminophen (TYLENOL) tablet 650 mg  650 mg Oral Q4H PRN Rhonda G Barrett, PA-C      . ALPRAZolam Duanne Moron) tablet 0.25 mg  0.25 mg Oral BID PRN Evelene Croon Barrett, PA-C      . aspirin EC tablet 81 mg  81 mg Oral Daily Rhonda G Barrett, PA-C   81 mg at 09/05/13 1101  . calcium acetate (PHOSLO) capsule 667 mg  667 mg Oral TID WC Louis Meckel, MD   667 mg at 09/05/13 1752  . cholecalciferol (VITAMIN D) tablet 1,000 Units  1,000 Units Oral Daily Evelene Croon Barrett, PA-C   1,000 Units at 09/05/13 1102  . cyclobenzaprine (FLEXERIL) tablet 5-10 mg  5-10 mg Oral TID PRN Evelene Croon Barrett, PA-C   10 mg at 09/04/13 1808  . darbepoetin (ARANESP) injection 60 mcg  60 mcg Subcutaneous Q Wed-1800 Louis Meckel, MD   60 mcg at 09/05/13 1751  . dextrose (GLUTOSE) 40 % oral gel 37.5 g  1 Tube Oral PRN Jettie Booze, MD   37.5 g at 09/04/13 0806  . ferric gluconate (NULECIT) 125 mg in sodium chloride 0.9 % 100 mL IVPB  125 mg Intravenous Daily Louis Meckel, MD   125 mg at  09/05/13 1533  .  furosemide (LASIX) injection 80 mg  80 mg Intravenous Q12H Peter M Martinique, MD   80 mg at 09/05/13 1752  . heparin ADULT infusion 100 units/mL (25000 units/250 mL)  1,850 Units/hr Intravenous Continuous Jettie Booze, MD 18.5 mL/hr at 09/05/13 1448 1,850 Units/hr at 09/05/13 1448  . insulin aspart (novoLOG) injection 0-9 Units  0-9 Units Subcutaneous TID WC Dayna N Dunn, PA-C   1 Units at 09/05/13 1752  . insulin detemir (LEVEMIR) injection 28 Units  28 Units Subcutaneous QHS Dayna N Dunn, PA-C      . metoprolol succinate (TOPROL-XL) 24 hr tablet 25 mg  25 mg Oral Daily Rhonda G Barrett, PA-C   25 mg at 09/05/13 1102  . nitroGLYCERIN (NITROSTAT) SL tablet 0.4 mg  0.4 mg Sublingual Q5 Min x 3 PRN Rhonda G Barrett, PA-C      . ondansetron (ZOFRAN) injection 4 mg  4 mg Intravenous Q6H PRN Rhonda G Barrett, PA-C      . polyvinyl alcohol (LIQUIFILM TEARS) 1.4 % ophthalmic solution 1 drop  1 drop Both Eyes PRN Veryl Speak, MD      . simvastatin (ZOCOR) tablet 10 mg  10 mg Oral q1800 Rhonda G Barrett, PA-C   10 mg at 09/04/13 1808  . sodium chloride 0.9 % injection 3 mL  3 mL Intravenous Q12H Rhonda G Barrett, PA-C   3 mL at 09/04/13 2350  . sodium chloride 0.9 % injection 3 mL  3 mL Intravenous PRN Rhonda G Barrett, PA-C      . zolpidem (AMBIEN) tablet 5 mg  5 mg Oral QHS PRN Lonn Georgia, PA-C        Prescriptions prior to admission  Medication Sig Dispense Refill  . acetaminophen (TYLENOL) 500 MG tablet Take 500 mg by mouth every 8 (eight) hours as needed for mild pain.      Marland Kitchen aspirin EC 81 MG EC tablet Take 1 tablet (81 mg total) by mouth daily.  30 tablet  0  . cholecalciferol (VITAMIN D) 1000 UNITS tablet Take 1,000 Units by mouth daily.      . cyclobenzaprine (FLEXERIL) 10 MG tablet Take 5-10 mg by mouth 3 (three) times daily as needed for muscle spasms.      . Insulin Detemir (LEVEMIR FLEXPEN) 100 UNIT/ML Pen Inject 34 Units into the skin every evening.  15 mL  11  .  insulin lispro (HUMALOG KWIKPEN) 100 UNIT/ML KiwkPen Inject 0.03 mLs (3 Units total) into the skin 3 (three) times daily with meals.  15 mL  0  . methylcellulose (ARTIFICIAL TEARS) 1 % ophthalmic solution Place 1 drop into both eyes at bedtime as needed (dry eyes).      . metolazone (ZAROXOLYN) 5 MG tablet Take 1 tablet (5 mg total) by mouth daily as needed.  30 tablet  0  . metoprolol succinate (TOPROL-XL) 25 MG 24 hr tablet Take 1 tablet (25 mg total) by mouth daily.  30 tablet  6  . potassium chloride SA (K-DUR,KLOR-CON) 20 MEQ tablet Take 1 tablet (20 mEq total) by mouth 2 (two) times daily.  60 tablet  0  . pravastatin (PRAVACHOL) 20 MG tablet Take 1 tablet (20 mg total) by mouth daily at 6 PM.  30 tablet  0  . torsemide (DEMADEX) 20 MG tablet Take 2 tablets (40 mg total) by mouth 2 (two) times daily.  90 tablet  0    Family History  Problem Relation Age of Onset  . Coronary artery disease  Father   . Asthma Father   . COPD Father   . Hypertension Father   . Hyperlipidemia Father   . Diabetes Father   . Congestive Heart Failure Father   . Heart disease Father     before age 57  . Heart attack Father   . Peripheral vascular disease Father   . Hypertension Mother   . Hyperlipidemia Mother   . Diabetes Mother   . Cancer Mother   . Cancer Maternal Aunt      three aunts, bone, breast, ?  . Hypertension Brother   . Diabetes Brother   . Diabetes Sister   . Colon cancer Neg Hx   . Celiac disease Neg Hx   . Crohn's disease Neg Hx   . Ulcerative colitis Neg Hx   . Lung cancer Maternal Grandmother      Review of Systems:     Cardiac Review of Systems: Y or N  Chest Pain [ no   ]  Resting SOB [   yes  ] Exertional SOB  [place]er   ]  Orthopnea [ Yes  ]   Pedal Edema [ yes  ]    Palpitations [  yes  ] Syncope  [no  ]   Presyncope [ Yes   ]  General Review of Systems: [Y] = yes [  ]=no Constitional: recent weight change [  ]; anorexia [  ]; fatigue [  ]; nausea [  ]; night sweats [  ];  fever [  ]; or chills [  ]                                                               Dental: poor dentition[  ]; Last Dentist visit: greater than one year    Eye : blurred vision [  ]; diplopia [   ]; vision changes [ yes-sees ophthalmologist for retinal disease  ];  Amaurosis fugax[  ]; Resp: cough [  ];  wheezing[  ];  hemoptysis[  ]; shortness of breath[yes   ]; paroxysmal nocturnal dyspnea[  yes  ]; dyspnea on exertion[yes   ]; or orthopnea[  ];  GI:  gallstones[  ], vomiting[  ];  dysphagia[  ]; melena[  ];  hematochezia [  ]; heartburn[  ];   Hx of  Colonoscopy[  ]; GU: kidney stones [  ]; hematuria[yes  ];   dysuryes yesia [  ];  nocturia[  ];  history of     obstruction [  ]; urinary frequency [  ]             Skin: rash, swelling[  ];, hair loss[  ];  peripheral edema[  ];  or itching[  ]; Musculosketetal: myalgias[  ];  joint swelling[  ];  joint erythema[  ];  joint pain[  ];  back pain[  ];  Heme/Lymph: bruising[  ];  bleeding[  ];  anemia[  ];  Neuro: TIA[  ];  headaches[  ];  stroke[  ];  vertigo[  ];  seizures[  ];   paresthesias[  ];  difficulty walking[Yes  ];  Psych:depression[  ]; anxiety[  ];  Endocrine: diabetes[yes  ];  thyroid dysfunction[  ];  Immunizations: Flu [  ]; Pneumococcal[  ];  Other: the patient has a left subclavian stenosis by exam and Dopplers   Physical Exam: BP 114/63  Pulse 110  Temp(Src) 97.9 F (36.6 C) (Oral)  Resp 17  Ht 5' 7.5" (1.715 m)  Wt 280 lb 6.8 oz (127.2 kg)  BMI 43.25 kg/m2  SpO2 95%  LMP 07/29/2013  General appearance-middle-aged Caucasian female morbidly obese sitting in bed comfortable   HEENT normocephalic dentition adequate   neck no JVD mass, no bruit Lungs-well-healed left thoracotomy scar, scattered rales diminished breath sounds at bases Cardiac-regular rhythm without murmur, positive S4 Abdomen obese nontender Extremities tight with edema up to the knees some cellulitis of the right lower leg Pulses nonpalpable  in the lower extremities Neuro no motor deficit  Diagnostic Studies & Laboratory data:Coronary angiogram, echocardiogram, carotid duplex, ABIs reviewed      Recent Radiology Findings:   No results found. pulmonary edema on chest x-ray     Recent Lab Findings: Lab Results  Component Value Date   WBC 6.1 09/05/2013   HGB 9.5* 09/05/2013   HCT 30.1* 09/05/2013   PLT 364 09/05/2013   GLUCOSE 109* 09/05/2013   CHOL 308* 11/22/2012   TRIG 430* 11/22/2012   HDL 34* 11/22/2012   LDLDIRECT 177.2 08/30/2011   LDLCALC Comment:   Not calculated due to Triglyceride >400. Suggest ordering Direct LDL (Unit Code: [phone]).   Total Cholesterol/HDL Ratio:CHD Risk                        Coronary Heart Disease Risk Table                                        Men       Women          1/2 Average Risk              3.4        3.3              Average Risk              5.0        4.4           2X Average Risk              9.6        7.1           3X Average Risk             23.4       11.0 Use the calculated Patient Ratio above and the CHD Risk table  to determine the patient's CHD Risk. ATP III Classification (LDL):       < 100        mg/dL         Optimal      100 - 129     mg/dL         Near or Above Optimal      130 - 159     mg/dL         Borderline High      160 - 189     mg/dL         High       > 190        mg/dL         Very High   11/22/2012  ALT 15 09/04/2013   AST 12 09/04/2013   NA 139 09/05/2013   K 3.3* 09/05/2013   CL 98 09/05/2013   CREATININE 4.17* 09/05/2013   BUN 62* 09/05/2013   CO2 27 09/05/2013   TSH 1.240 09/05/2013   INR 1.09 09/03/2013   HGBA1C 9.6* 07/06/2013      Assessment / Plan:     43 year old type I diabetic with severe three-vessel CAD ischemic cardiomyopathy. She has COPD and is status post left lower lobectomy 2 years ago for non-small cell carcinoma stage I She is being prepared for hemodialysis and has severe fluid overload, weight 280 pounds, normal weight 215 pounds. The patient.  would not tolerate sternotomy and CABG in her current condition. She needs to proceed with hemodialysis access versus ultrafiltration to remove her extreme edema-fluid overload. With her pulmonary disease and excess fluid gained from CABG she would not wean from a ventilator. The patient needs to be evaluated by her vascular  surgeon Dr. Trula Slade for  Left carotid endarterectomy as well.  Recommend-vascular consult by Dr. Trula Slade, consultation to advanced heart failure service for treatment of acute heart failure and fluid overload with ultrafiltration versus hemodialysis. She would need to have a vascular access in place prior to CABG because she will develop postop oliguric renal failure. We will also do vein mapping on her lower Chairman knees to assess for adequate conduit. Patient is currently not in adequate condition for multivessel CABG with possible combined carotid endarterectomy. We'll follow.        @ME1 @ 09/05/2013 6:00 PM

## 2013-09-05 NOTE — Progress Notes (Signed)
Patient Name: Hannah [name] Date of Encounter: 09/05/2013     Principal Problem:   Intermediate coronary syndrome Active Problems:   Nephrotic syndrome   Type II or unspecified type diabetes mellitus with neurological manifestations, not stated as uncontrolled   Hyperlipidemia   Essential hypertension, benign   Normocytic anemia   Chronic kidney disease (CKD), stage V   CHF (congestive heart failure)   CAD (coronary artery disease)    SUBJECTIVE Some shortness of breath overnight, improved. Denies any chest pain. LE edema stable.    CURRENT MEDS . aspirin EC  81 mg Oral Daily  . calcium acetate  667 mg Oral TID WC  . cholecalciferol  1,000 Units Oral Daily  . darbepoetin (ARANESP) injection - NON-DIALYSIS  60 mcg Subcutaneous Q Wed-1800  . ferric gluconate (FERRLECIT/NULECIT) IV  125 mg Intravenous Daily  . furosemide  80 mg Intravenous Q12H  . insulin aspart  0-9 Units Subcutaneous TID WC  . insulin detemir  28 Units Subcutaneous QHS  . metoprolol succinate  25 mg Oral Daily  . simvastatin  10 mg Oral q1800  . sodium chloride  3 mL Intravenous Q12H    OBJECTIVE  Filed Vitals:   09/04/13 1800 09/04/13 1845 09/04/13 2040 09/05/13 0430  BP: 121/68 99/79 104/72 109/66  Pulse: 104 103 106 101  Temp:   97.4 F (36.3 C) 97.5 F (36.4 C)  TempSrc:   Oral Oral  Resp:   18 18  Height:      Weight:    280 lb 6.8 oz (127.2 kg)  SpO2:   92% 94%    Intake/Output Summary (Last 24 hours) at 09/05/13 1357 Last data filed at 09/05/13 1329  Gross per 24 hour  Intake  981.6 ml  Output    450 ml  Net  531.6 ml   Filed Weights   09/03/13 2100 09/04/13 0500 09/05/13 0430  Weight: 275 lb 8 oz (124.966 kg) 274 lb 1.6 oz (124.331 kg) 280 lb 6.8 oz (127.2 kg)    PHYSICAL EXAM  General: Pleasant, NAD. Neuro: Alert and oriented X 3. Moves all extremities spontaneously. Psych: Normal affect. HEENT:  R eye blind, otherwise normal  Neck: Supple without bruits or  JVD. Lungs:  Resp regular and unlabored, decreased breath sound in bilateral basis. Otherwise clear in other lung field. Heart: RRR no s3, s4, or murmurs. Abdomen: Soft, non-tender, non-distended, BS + x 4.  Extremities: No clubbing, cyanosis. DP/PT/Radials 2+ and equal bilaterally. Trace edema in bilateral LE; roght groin bruised, palpable right DP pulse  Accessory Clinical Findings  CBC  Recent Labs  09/04/13 0400 09/05/13 0555  WBC 6.8 6.1  HGB 9.8* 9.5*  HCT 31.5* 30.1*  MCV 92.4 93.8  PLT 450* 376   Basic Metabolic Panel  Recent Labs  09/04/13 0400 09/05/13 0555  NA 142 139  K 3.4* 3.3*  CL 100 98  CO2 27 27  GLUCOSE 122* 109*  BUN 64* 62*  CREATININE 4.38* 4.17*  CALCIUM 7.8* 7.7*  PHOS  --  6.6*   Liver Function Tests  Recent Labs  09/03/13 1352 09/04/13 0400 09/05/13 0555  AST 13 12  --   ALT 16 15  --   ALKPHOS 68 64  --   BILITOT <0.2* <0.2*  --   PROT 5.5* 5.3*  --   ALBUMIN 2.0* 1.9* 1.8*   No results found for this basename: LIPASE, AMYLASE,  in the last 72 hours Cardiac Enzymes  Recent Labs  09/03/13 1908 09/04/13 0143 09/04/13 0825  TROPONINI 0.45* 0.70* 0.45*    TELE  Sinus tachycardia since admission with HR in 100-110s. No significant ventricular ectopy.  ECG 5/19: sinus tachy with HR in 100s, poor R wave progression in anterior lead.  Radiology/Studies  Ct Chest Wo Contrast  08/22/2013   CLINICAL DATA:  Left lung cancer status post lobectomy. Weight gain. Renal insufficiency.  EXAM: CT CHEST WITHOUT CONTRAST  TECHNIQUE: Multidetector CT imaging of the chest was performed following the standard protocol without IV contrast.  COMPARISON:  Radiographs 08/13/2013 and 08/04/2013.  CT 08/02/2012.  FINDINGS: There are stable postsurgical changes related to prior left lower lobe resection. There has been interval enlargement of several mediastinal lymph nodes. These include 12 mm right paratracheal (image 19), 13 mm precarinal (image  23), and 14 mm AP window (image 25) lymph nodes. Some of these nodes have retained fatty hila. Allowing for the limitations of noncontrast technique, the hila appear stable.  There is stable low-density within the right thyroid lobe. Atherosclerosis of the aorta, great vessels and coronary arteries is noted. The heart size is normal. There is no pericardial effusion.  Moderate size dependent pleural effusions are present bilaterally. There is associated dependent airspace disease in both lower lobes. In addition, there are more focal airspace opacities within the right lower lobe and inferior aspect of the left upper lobe. The latter is somewhat nodular, measuring up to 1.8 cm on image 43. No other focal nodularity or endobronchial lesions are demonstrated.  The visualized upper abdomen has a stable appearance. There is no adrenal mass. There is increased subcutaneous edema throughout the subcutaneous fat, especially within the anterior aspect of the upper abdomen.  No worrisome osseous findings are demonstrated.  IMPRESSION: 1. As demonstrated radiographically, there are bilateral pleural effusions and bilateral airspace opacities which are new compared with the prior CT. Although there are focal somewhat nodular components in the right lower and left upper lobes, these findings are most likely infectious/inflammatory. 2. Progressive mediastinal lymphadenopathy. Some of the nodes have retained fatty hila and may be reactive. Metastatic disease cannot be completely excluded. 3. Stable atherosclerosis and low-density within the right thyroid lobe. 4. If the airspace opacities and pleural effusions fail to respond to appropriate clinical therapy, follow-up CT or PET-CT may be warranted to exclude metastatic disease.   Electronically Signed   By: Camie Patience M.D.   On: 08/22/2013 10:26   Dg Chest Portable 1 View  08/13/2013   CLINICAL DATA:  Shortness of breath increasing for 2 weeks. History of CHF.  EXAM:  PORTABLE CHEST - 1 VIEW  COMPARISON:  08/04/2013  FINDINGS: Again noted are bibasilar lung densities that are suggestive for pleural effusions and atelectasis. Heart size appears to be enlarged. There is some peribronchial thickening and cannot exclude pulmonary edema.  IMPRESSION: Bilateral pleural effusions with basilar atelectasis. There is mild pulmonary edema. Minimal change from the previous examination.   Electronically Signed   By: Markus Daft M.D.   On: 08/13/2013 23:10    ASSESSMENT AND PLAN  43 yo morbidly obese female with PMH of stage V kidney disease currently being considered for a-v fistula placement and CAD without stent who was recently discharged for heart failure symptom had a high risk stress test which showed ischemia in anterior and apical region.  1. Multivessel CAD by cath.  CABG post diuresis. CT surgery to see. 2. Acute systolic dysfunction 2/2 likely ischemic cardiomyopathy  - Echo 08/10/2012 55-60%  -  Nuc stress 08/2013 EF 22%  3. Elevated troponin  - likely due to a combination of CKD and underlying ischemia  4. Stage V CKD  Diuresis due to increased LVEDP. Cr may get wrose.  5. Normocytic Anemia  - appears to be chronic, 2/2 CKD  - changed from 10.6 to 9.8, continue to monitor  - patient denies any bleeding problem  6. Tachycardia  - likely related to low EF- improved today  7. Carotid stenosis  - may need carotid intervention in the future  Insulin adjusted due to occasional NPO status.     Jettie Booze

## 2013-09-05 NOTE — Progress Notes (Signed)
*  Preliminary Results*   Pre-op Cardiac Surgery  Carotid Findings:  Performed 08/31/2013 at another facility. Right 60-79% internal carotid artery stenosis, left 80-99% internal carotid artery stenosis. Vertebral arteries are patent with antegrade flow. More details can be found in [facility].  Upper Extremity Right Left  Brachial Pressures 118-Triphasic 103-Monophasic  Radial Waveforms Triphasic Monophasic  Ulnar Waveforms Monophasic Dampened monophasic  Palmar Arch (Allen's Test) Signal is unaffected with both radial and ulnar compression. Signal obliterates with radial compression, decreases 50% with ulnar compression.    Lower  Extremity Right Left  Dorsalis Pedis 131-Biphasic 111-Monophasic  Anterior Tibial    Posterior Tibial 127-Biphasic 113-Biphasic  Ankle/Brachial Indices 1.11 0.96   Findings:   Bilateral ABIs are within normal limits.  09/05/2013 12:52 PM Maudry Mayhew, RVT, RDCS, RDMS

## 2013-09-05 NOTE — Progress Notes (Signed)
Subjective:  Patient looks good- cardiac cath showed severe 3 vessel dz- for CTS consult.  BP good- UOP not fully recorded - creatinine stable to improved this AM Objective Vital signs in last 24 hours: Filed Vitals:   09/04/13 1800 09/04/13 1845 09/04/13 2040 09/05/13 0430  BP: 121/68 99/79 104/72 109/66  Pulse: 104 103 106 101  Temp:   97.4 F (36.3 C) 97.5 F (36.4 C)  TempSrc:   Oral Oral  Resp:   18 18  Height:      Weight:    127.2 kg (280 lb 6.8 oz)  SpO2:   92% 94%   Weight change: 0.193 kg (6.8 oz)  Intake/Output Summary (Last 24 hours) at 09/05/13 1036 Last data filed at 09/05/13 0831  Gross per 24 hour  Intake  741.6 ml  Output    450 ml  Net  291.6 ml    Assessment/ Plan: Pt is a 43 y.o. yo female with advanced CKD at baseline who was admitted on 09/03/2013 with abnormal stress test in the setting of pre op for AVF Assessment/Plan: 1. Renal- advanced CKD at baseline followed by Dr. Marval Regal  due to DM- multiple hospitalizations of late.  Now with abnormal stress test and worsened EF and 3 vessel CAD.  Precautions used for contrast and so far so good.  I will stop IVF.   If she requires HD this hosp will need catheter 2. Cards- as above, CTS consult pending-  On toprol and zocor 3. Anemia- hgb 9.5- iron stores low, will give iron and have added aranesp 4. Secondary hyperparathyroidism- will check PTH (pending) and phos 6.4- will add phoslo 5. HTN/volume- blood pressure is quite good.  Gentle hydration for contrast now has passed  I agree is volume overloaded now on IV lasix  Louis Meckel    Labs: Basic Metabolic Panel:  Recent Labs Lab 09/03/13 1352 09/04/13 0400 09/05/13 0555  NA 141 142 139  K 3.9 3.4* 3.3*  CL 100 100 98  CO2 25 27 27   GLUCOSE 172* 122* 109*  BUN 66* 64* 62*  CREATININE 4.46* 4.38* 4.17*  CALCIUM 8.0* 7.8* 7.7*  PHOS  --   --  6.6*   Liver Function Tests:  Recent Labs Lab 09/03/13 1352 09/04/13 0400 09/05/13 0555   AST 13 12  --   ALT 16 15  --   ALKPHOS 68 64  --   BILITOT <0.2* <0.2*  --   PROT 5.5* 5.3*  --   ALBUMIN 2.0* 1.9* 1.8*   No results found for this basename: LIPASE, AMYLASE,  in the last 168 hours No results found for this basename: AMMONIA,  in the last 168 hours CBC:  Recent Labs Lab 09/03/13 1352 09/04/13 0400 09/05/13 0555  WBC 9.4 6.8 6.1  HGB 10.6* 9.8* 9.5*  HCT 33.2* 31.5* 30.1*  MCV 92.2 92.4 93.8  PLT 441* 450* 364   Cardiac Enzymes:  Recent Labs Lab 09/03/13 1908 09/04/13 0143 09/04/13 0825  TROPONINI 0.45* 0.70* 0.45*   CBG:  Recent Labs Lab 09/04/13 1506 09/04/13 1642 09/04/13 2122 09/05/13 0644 09/05/13 0712  GLUCAP 102* 166* 145* 101* 86    Iron Studies: No results found for this basename: IRON, TIBC, TRANSFERRIN, FERRITIN,  in the last 72 hours Studies/Results: No results found. Medications: Infusions: . heparin 1,850 Units/hr (09/05/13 0804)    Scheduled Medications: . acetylcysteine  1,200 mg Oral BID  . aspirin EC  81 mg Oral Daily  . cholecalciferol  1,000 Units Oral Daily  . furosemide  80 mg Intravenous Q12H  . insulin aspart  3 Units Subcutaneous TID WC  . insulin detemir  34 Units Subcutaneous QHS  . metoprolol succinate  25 mg Oral Daily  . simvastatin  10 mg Oral q1800  . sodium chloride  3 mL Intravenous Q12H    have reviewed scheduled and prn medications.  Physical Exam: General: NAD Heart: RRR- slightly tachy Lungs:mostly clear Abdomen: obese, soft, non tender Extremities: pitting edema    09/05/2013,10:36 AM  LOS: 2 days

## 2013-09-05 NOTE — Progress Notes (Signed)
ANTICOAGULATION CONSULT NOTE - Follow Up Consult  Pharmacy Consult for Heparin Indication: Awaiting CABG  Allergies  Allergen Reactions  . Crestor [Rosuvastatin] Other (See Comments)    Severe muscle weakness  . Nsaids Other (See Comments)    Not allergic, "bad on my kidneys"  . Ciprofloxacin Rash    Patient Measurements: Height: 5' 7.5" (171.5 cm) Weight: 280 lb 6.8 oz (127.2 kg) IBW/kg (Calculated) : 62.75 Heparin Dosing Weight: 91 kg  Vital Signs: Temp: 97.9 F (36.6 C) (05/20 1357) Temp src: Oral (05/20 1357) BP: 114/63 mmHg (05/20 1357) Pulse Rate: 110 (05/20 1357)  Labs:  Recent Labs  09/03/13 1352 09/03/13 1908 09/04/13 0143 09/04/13 0400 09/04/13 0825 09/05/13 0555 09/05/13 1625  HGB 10.6*  --   --  9.8*  --  9.5*  --   HCT 33.2*  --   --  31.5*  --  30.1*  --   PLT 441*  --   --  450*  --  364  --   APTT  --  32  --   --   --   --   --   LABPROT  --  13.9  --   --   --   --   --   INR  --  1.09  --   --   --   --   --   HEPARINUNFRC  --   --   --   --  0.10* 0.15* 0.19*  CREATININE 4.46*  --   --  4.38*  --  4.17*  --   TROPONINI  --  0.45* 0.70*  --  0.45*  --   --     Estimated Creatinine Clearance: 23.8 ml/min (by C-G formula based on Cr of 4.17).   Assessment: Hep for 3VCAD for CABG: ICM, EF 30-35%. Not yet medically stable for CABG. Current heparin level 0.19 remains < goal.  Goal of Therapy:  Heparin level 0.3-0.7 units/ml Monitor platelets by anticoagulation protocol: Yes   Plan:  Increase IV heparin to 2150 Units/hr. Recheck heparin level with AM labs.  Akeel Reffner Google 09/05/2013,6:53 PM

## 2013-09-06 ENCOUNTER — Inpatient Hospital Stay (HOSPITAL_COMMUNITY): Payer: Medicare Other

## 2013-09-06 DIAGNOSIS — Z01810 Encounter for preprocedural cardiovascular examination: Secondary | ICD-10-CM

## 2013-09-06 DIAGNOSIS — I5023 Acute on chronic systolic (congestive) heart failure: Principal | ICD-10-CM

## 2013-09-06 LAB — CBC
HEMATOCRIT: 30.9 % — AB (ref 36.0–46.0)
Hemoglobin: 9.8 g/dL — ABNORMAL LOW (ref 12.0–15.0)
MCH: 29.3 pg (ref 26.0–34.0)
MCHC: 31.7 g/dL (ref 30.0–36.0)
MCV: 92.2 fL (ref 78.0–100.0)
Platelets: 379 10*3/uL (ref 150–400)
RBC: 3.35 MIL/uL — ABNORMAL LOW (ref 3.87–5.11)
RDW: 13.8 % (ref 11.5–15.5)
WBC: 6 10*3/uL (ref 4.0–10.5)

## 2013-09-06 LAB — URINALYSIS, ROUTINE W REFLEX MICROSCOPIC
Bilirubin Urine: NEGATIVE
Glucose, UA: 250 mg/dL — AB
Ketones, ur: NEGATIVE mg/dL
Leukocytes, UA: NEGATIVE
Nitrite: NEGATIVE
Protein, ur: >300 mg/dL — AB
Specific Gravity, Urine: 1.013 (ref 1.005–1.030)
Urobilinogen, UA: 0.2 mg/dL (ref 0.0–1.0)
pH: 7 (ref 5.0–8.0)

## 2013-09-06 LAB — PULMONARY FUNCTION TEST
DL/VA % pred: 102 %
DL/VA: 5.27 ml/min/mmHg/L
DLCO cor % pred: 40 %
DLCO cor: 11.41 ml/min/mmHg
DLCO unc % pred: 34 %
DLCO unc: 9.76 ml/min/mmHg
FEF 25-75 Post: 1.22 L/sec
FEF 25-75 Pre: 0.54 L/sec
FEF2575-%Change-Post: 128 %
FEF2575-%Pred-Post: 39 %
FEF2575-%Pred-Pre: 17 %
FEV1-%Change-Post: 27 %
FEV1-%Pred-Post: 36 %
FEV1-%Pred-Pre: 28 %
FEV1-Post: 1.18 L
FEV1-Pre: 0.92 L
FEV1FVC-%Change-Post: 9 %
FEV1FVC-%Pred-Pre: 83 %
FEV6-%Change-Post: 16 %
FEV6-%Pred-Post: 40 %
FEV6-%Pred-Pre: 34 %
FEV6-Post: 1.59 L
FEV6-Pre: 1.36 L
FEV6FVC-%Pred-Post: 102 %
FEV6FVC-%Pred-Pre: 102 %
FVC-%Change-Post: 16 %
FVC-%Pred-Post: 39 %
FVC-%Pred-Pre: 34 %
FVC-Post: 1.59 L
FVC-Pre: 1.36 L
Post FEV1/FVC ratio: 74 %
Post FEV6/FVC ratio: 100 %
Pre FEV1/FVC ratio: 68 %
Pre FEV6/FVC Ratio: 100 %
RV % pred: 74 %
RV: 1.37 L
TLC % pred: 53 %
TLC: 2.96 L

## 2013-09-06 LAB — GLUCOSE, CAPILLARY
GLUCOSE-CAPILLARY: 56 mg/dL — AB (ref 70–99)
GLUCOSE-CAPILLARY: 64 mg/dL — AB (ref 70–99)
GLUCOSE-CAPILLARY: 126 mg/dL — AB (ref 70–99)
Glucose-Capillary: 154 mg/dL — ABNORMAL HIGH (ref 70–99)
Glucose-Capillary: 270 mg/dL — ABNORMAL HIGH (ref 70–99)
Glucose-Capillary: 220 mg/dL — ABNORMAL HIGH (ref 70–99)
Glucose-Capillary: 173 mg/dL — ABNORMAL HIGH (ref 70–99)

## 2013-09-06 LAB — COMPREHENSIVE METABOLIC PANEL
ALT: 12 U/L (ref 0–35)
AST: 13 U/L (ref 0–37)
Albumin: 2.1 g/dL — ABNORMAL LOW (ref 3.5–5.2)
Alkaline Phosphatase: 62 U/L (ref 39–117)
BUN: 58 mg/dL — ABNORMAL HIGH (ref 6–23)
CO2: 28 mEq/L (ref 19–32)
Calcium: 8.2 mg/dL — ABNORMAL LOW (ref 8.4–10.5)
Chloride: 98 mEq/L (ref 96–112)
Creatinine, Ser: 4.28 mg/dL — ABNORMAL HIGH (ref 0.50–1.10)
GFR calc Af Amer: 13 mL/min — ABNORMAL LOW (ref >90)
GFR calc non Af Amer: 12 mL/min — ABNORMAL LOW (ref >90)
Glucose, Bld: 53 mg/dL — ABNORMAL LOW (ref 70–99)
Potassium: 3.4 mEq/L — ABNORMAL LOW (ref 3.7–5.3)
Sodium: 141 mEq/L (ref 137–147)
Total Bilirubin: <0.2 mg/dL — ABNORMAL LOW (ref 0.3–1.2)
Total Protein: 5.4 g/dL — ABNORMAL LOW (ref 6.0–8.3)

## 2013-09-06 LAB — URINE MICROSCOPIC-ADD ON

## 2013-09-06 LAB — CARBOXYHEMOGLOBIN
CARBOXYHEMOGLOBIN: 1.7 % — AB (ref 0.5–1.5)
CARBOXYHEMOGLOBIN: 1.3 % (ref 0.5–1.5)
Methemoglobin: 1.1 % (ref 0.0–1.5)
Methemoglobin: 0.7 % (ref 0.0–1.5)
O2 SAT: 46 %
O2 SAT: 49.4 %
TOTAL HEMOGLOBIN: 9.5 g/dL — AB (ref 12.0–16.0)
TOTAL HEMOGLOBIN: 13.3 g/dL (ref 12.0–16.0)

## 2013-09-06 LAB — POCT ACTIVATED CLOTTING TIME
Activated Clotting Time: 116 seconds
Activated Clotting Time: 177 seconds
Activated Clotting Time: 199 seconds

## 2013-09-06 LAB — PROTIME-INR
INR: 1.13 (ref 0.00–1.49)
Prothrombin Time: 14.3 seconds (ref 11.6–15.2)

## 2013-09-06 LAB — HEPARIN LEVEL (UNFRACTIONATED): Heparin Unfractionated: 0.58 IU/mL (ref 0.30–0.70)

## 2013-09-06 LAB — PARATHYROID HORMONE, INTACT (NO CA): PTH: 430.2 pg/mL — AB (ref 14.0–72.0)

## 2013-09-06 LAB — HEMOGLOBIN A1C
Hgb A1c MFr Bld: 8 % — ABNORMAL HIGH (ref <5.7)
Mean Plasma Glucose: 183 mg/dL — ABNORMAL HIGH (ref <117)

## 2013-09-06 LAB — PRO B NATRIURETIC PEPTIDE: Pro B Natriuretic peptide (BNP): 28622 pg/mL — ABNORMAL HIGH (ref 0–125)

## 2013-09-06 MED ORDER — HEPARIN (PORCINE) 2000 UNITS/L FOR CRRT
INTRAVENOUS_CENTRAL | Status: DC | PRN
  Filled 2013-09-06: qty 1000

## 2013-09-06 MED ORDER — HEPARIN BOLUS VIA INFUSION (CRRT)
1000.0000 [IU] | INTRAVENOUS | Status: DC | PRN
  Administered 2013-09-11 (×2): 1000 [IU] via INTRAVENOUS_CENTRAL
  Filled 2013-09-06 (×2): qty 1000

## 2013-09-06 MED ORDER — POTASSIUM CHLORIDE CRYS ER 20 MEQ PO TBCR
40.0000 meq | EXTENDED_RELEASE_TABLET | Freq: Every day | ORAL | Status: DC
  Administered 2013-09-06: 40 meq via ORAL
  Filled 2013-09-06 (×2): qty 2

## 2013-09-06 MED ORDER — MIDAZOLAM HCL 2 MG/2ML IJ SOLN
INTRAMUSCULAR | Status: AC | PRN
  Administered 2013-09-06: 2 mg via INTRAVENOUS

## 2013-09-06 MED ORDER — LIDOCAINE HCL (PF) 1 % IJ SOLN
INTRAMUSCULAR | Status: AC
  Filled 2013-09-06: qty 5

## 2013-09-06 MED ORDER — MIDAZOLAM HCL 2 MG/2ML IJ SOLN
2.0000 mg | Freq: Once | INTRAMUSCULAR | Status: DC
  Filled 2013-09-06: qty 2

## 2013-09-06 MED ORDER — LIDOCAINE HCL (PF) 1 % IJ SOLN: 5.0000 mL | Freq: Once | INTRAMUSCULAR | Status: DC

## 2013-09-06 MED ORDER — PRISMASOL BGK 4/2.5 32-4-2.5 MEQ/L IV SOLN
INTRAVENOUS | Status: DC
  Administered 2013-09-06 – 2013-09-12 (×34): via INTRAVENOUS_CENTRAL
  Filled 2013-09-06 (×50): qty 5000

## 2013-09-06 MED ORDER — NOREPINEPHRINE BITARTRATE 1 MG/ML IV SOLN
2.0000 ug/min | INTRAVENOUS | Status: DC
  Administered 2013-09-06 – 2013-09-08 (×2): 5 ug/min via INTRAVENOUS
  Administered 2013-09-11: 3 ug/min via INTRAVENOUS
  Administered 2013-09-12: 5 ug/min via INTRAVENOUS
  Administered 2013-09-19: 2 ug/min via INTRAVENOUS
  Administered 2013-09-19 – 2013-09-20 (×2): 20 ug/min via INTRAVENOUS
  Administered 2013-09-20: 95 ug/min via INTRAVENOUS
  Administered 2013-09-20: 40 ug/min via INTRAVENOUS
  Administered 2013-09-21: 36 ug/min via INTRAVENOUS
  Administered 2013-09-21 (×2): 38 ug/min via INTRAVENOUS
  Administered 2013-09-21: 41 ug/min via INTRAVENOUS
  Administered 2013-09-23: 28 ug/min via INTRAVENOUS
  Administered 2013-09-24: 34 ug/min via INTRAVENOUS
  Administered 2013-09-25 (×2): 35 ug/min via INTRAVENOUS
  Administered 2013-09-25 – 2013-09-26 (×2): 34 ug/min via INTRAVENOUS
  Administered 2013-09-26 (×2): 35 ug/min via INTRAVENOUS
  Administered 2013-09-27: 15 ug/min via INTRAVENOUS
  Administered 2013-09-27: 35 ug/min via INTRAVENOUS
  Administered 2013-09-28: 16 ug/min via INTRAVENOUS
  Administered 2013-09-29: 7 ug/min via INTRAVENOUS
  Administered 2013-10-01: 4 ug/min via INTRAVENOUS
  Administered 2013-10-02: 3 ug/min via INTRAVENOUS
  Administered 2013-10-05: 1.5 ug/min via INTRAVENOUS
  Filled 2013-09-06 (×37): qty 16

## 2013-09-06 MED ORDER — PRISMASOL BGK 4/2.5 32-4-2.5 MEQ/L IV SOLN
INTRAVENOUS | Status: DC
  Administered 2013-09-06 – 2013-09-12 (×21): via INTRAVENOUS_CENTRAL
  Filled 2013-09-06 (×26): qty 5000

## 2013-09-06 MED ORDER — MIDAZOLAM HCL 2 MG/2ML IJ SOLN
INTRAMUSCULAR | Status: AC
  Administered 2013-09-06: 2 mg
  Filled 2013-09-06: qty 2

## 2013-09-06 MED ORDER — FENTANYL CITRATE 0.05 MG/ML IJ SOLN
25.0000 ug | Freq: Once | INTRAMUSCULAR | Status: DC
  Filled 2013-09-06: qty 2

## 2013-09-06 MED ORDER — HEPARIN SODIUM (PORCINE) 1000 UNIT/ML DIALYSIS
1000.0000 [IU] | INTRAMUSCULAR | Status: DC | PRN
  Administered 2013-09-12: 3000 [IU] via INTRAVENOUS_CENTRAL
  Filled 2013-09-06 (×2): qty 6
  Filled 2013-09-06: qty 3

## 2013-09-06 MED ORDER — FENTANYL CITRATE 0.05 MG/ML IJ SOLN
INTRAMUSCULAR | Status: AC | PRN
  Administered 2013-09-06: 25 ug via INTRAVENOUS

## 2013-09-06 MED ORDER — FENTANYL BOLUS VIA INFUSION: 50.0000 ug | Freq: Once | INTRAVENOUS | Status: DC

## 2013-09-06 MED ORDER — PRISMASOL BGK 4/2.5 32-4-2.5 MEQ/L IV SOLN
INTRAVENOUS | Status: DC
  Administered 2013-09-06 – 2013-09-12 (×13): via INTRAVENOUS_CENTRAL
  Filled 2013-09-06 (×23): qty 5000

## 2013-09-06 MED ORDER — FENTANYL CITRATE 0.05 MG/ML IJ SOLN
INTRAMUSCULAR | Status: AC
  Administered 2013-09-06: 50 ug
  Filled 2013-09-06: qty 2

## 2013-09-06 MED ORDER — FUROSEMIDE 10 MG/ML IJ SOLN
160.0000 mg | Freq: Two times a day (BID) | INTRAVENOUS | Status: DC
  Administered 2013-09-06: 160 mg via INTRAVENOUS
  Filled 2013-09-06 (×4): qty 16

## 2013-09-06 MED ORDER — FENTANYL CITRATE 0.05 MG/ML IJ SOLN: 50.0000 ug | Freq: Once | INTRAMUSCULAR | Status: DC

## 2013-09-06 MED ORDER — SODIUM CHLORIDE 0.9 % IJ SOLN
250.0000 [IU]/h | INTRAMUSCULAR | Status: DC
  Administered 2013-09-06: 250 [IU]/h via INTRAVENOUS_CENTRAL
  Administered 2013-09-07: 350 [IU]/h via INTRAVENOUS_CENTRAL
  Administered 2013-09-07: 550 [IU]/h via INTRAVENOUS_CENTRAL
  Administered 2013-09-08: 2100 [IU]/h via INTRAVENOUS_CENTRAL
  Administered 2013-09-08: 1500 [IU]/h via INTRAVENOUS_CENTRAL
  Administered 2013-09-08: 800 [IU]/h via INTRAVENOUS_CENTRAL
  Administered 2013-09-09 – 2013-09-10 (×8): 2500 [IU]/h via INTRAVENOUS_CENTRAL
  Administered 2013-09-10: 1000 [IU]/h via INTRAVENOUS_CENTRAL
  Administered 2013-09-10 – 2013-09-11 (×5): 2500 [IU]/h via INTRAVENOUS_CENTRAL
  Administered 2013-09-11: 2450 [IU]/h via INTRAVENOUS_CENTRAL
  Administered 2013-09-11 (×3): 2500 [IU]/h via INTRAVENOUS_CENTRAL
  Administered 2013-09-12 (×3): 2450 [IU]/h via INTRAVENOUS_CENTRAL
  Filled 2013-09-06 (×29): qty 2

## 2013-09-06 MED ORDER — DEXTROSE 5 % IV SOLN
160.0000 mg | Freq: Once | INTRAVENOUS | Status: AC
  Administered 2013-09-06: 160 mg via INTRAVENOUS
  Filled 2013-09-06: qty 16

## 2013-09-06 MED ORDER — MIDAZOLAM BOLUS VIA INFUSION
2.0000 mg | Freq: Once | INTRAVENOUS | Status: DC
  Filled 2013-09-06: qty 2

## 2013-09-06 MED ORDER — METOLAZONE 5 MG PO TABS
5.0000 mg | ORAL_TABLET | Freq: Two times a day (BID) | ORAL | Status: DC
  Administered 2013-09-06 (×2): 5 mg via ORAL
  Filled 2013-09-06 (×8): qty 1

## 2013-09-06 MED ORDER — SODIUM CHLORIDE 0.9 % IV SOLN
INTRAVENOUS | Status: DC | PRN
  Administered 2013-09-06 – 2013-09-22 (×4): via INTRAVENOUS

## 2013-09-06 MED ORDER — CARVEDILOL 3.125 MG PO TABS
3.1250 mg | ORAL_TABLET | Freq: Two times a day (BID) | ORAL | Status: DC
  Administered 2013-09-06: 3.125 mg via ORAL
  Filled 2013-09-06 (×2): qty 1

## 2013-09-06 MED ORDER — FUROSEMIDE 10 MG/ML IJ SOLN
160.0000 mg | Freq: Two times a day (BID) | INTRAVENOUS | Status: DC
  Filled 2013-09-06: qty 16

## 2013-09-06 MED ORDER — FUROSEMIDE 10 MG/ML IJ SOLN: 80.0000 mg | Freq: Once | INTRAMUSCULAR | Status: DC

## 2013-09-06 NOTE — Progress Notes (Signed)
*  Preliminary Results*   Right Lower Extremity Vein Map    Right Great Saphenous Vein   Segment Diameter Comment  1. Origin 6.20mm   2. High Thigh 3.37mm   3. Mid Thigh 3.31mm   4. Low Thigh 3.71mm   5. At Knee 2.31mm   6. High Calf 2.59mm   7. Low Calf 2.24mm   8. Ankle 2.54mm                 Right Small Saphenous Vein  Segment Diameter Comment  1. Origin 2.52mm   2. High Calf 2.73mm   3. Low Calf 2.70mm   4. Ankle                  Left Lower Extremity Vein Map    Left Great Saphenous Vein   Segment Diameter Comment  1. Origin 6.71mm   2. High Thigh 6.66mm   3. Mid Thigh 3.60mm   4. Low Thigh 3.69mm   5. At Knee 4.22mm   6. High Calf 3.8mm   7. Low Calf 2.77mm   8. Ankle 2.35mm                 Left Small Saphenous Vein  Segment Diameter Comment  1. Origin 4.33mm   2. High Calf  Unable to visualize  3. Low Calf  Unable to visualize  4. Ankle  Unable to visualize               09/06/2013 1:04 PM Hannah [name], RVT, RDCS, RDMS

## 2013-09-06 NOTE — Procedures (Signed)
Central Venous Catheter Insertion Procedure Note Hannah [name] [id number] September 27, 1970  Procedure: Insertion of Central Venous Catheter Indications: Drug and/or fluid administration  Procedure Details Consent: Risks of procedure as well as the alternatives and risks of each were explained to the (patient/caregiver).  Consent for procedure obtained. Time Out: Verified patient identification, verified procedure, site/side was marked, verified correct patient position, special equipment/implants available, medications/allergies/relevent history reviewed, required imaging and test results available.  Performed  Maximum sterile technique was used including antiseptics, cap, gloves, gown, hand hygiene, mask and sheet. Skin prep: Chlorhexidine; local anesthetic administered A antimicrobial bonded/coated triple lumen catheter was placed in the right internal jugular vein using the Seldinger technique.  Evaluation Blood flow good Complications: No apparent complications Patient did tolerate procedure well. Chest X-ray ordered to verify placement.  CXR: pending.  Shaune Pascal Gennaro Lizotte MD 09/06/2013, 5:43 PM

## 2013-09-06 NOTE — Progress Notes (Signed)
Pt transferred to [facility] per MD order. All belongings sent with pt. Pt transported to [place] via bed by Rapid Response RN. Report given to [place], Therapist, sports. Lenna Sciara

## 2013-09-06 NOTE — Progress Notes (Signed)
RT has made multiple attempts to collect ABG this AM. Each attempt venous sample was drawn. Multiple attempts to palpate radial and brachial pulse without success. Pt stated that she is a difficult stick. Sample was drawn and ran by RT. Sample resulted as venous. Unable to obtain arterial sample

## 2013-09-06 NOTE — Progress Notes (Signed)
Inpatient Diabetes Program Recommendations  AACE/ADA: New Consensus Statement on Inpatient Glycemic Control (2013)  Target Ranges:  Prepandial:   less than 140 mg/dL      Peak postprandial:   less than 180 mg/dL (1-2 hours)      Critically ill patients:  140 - 180 mg/dL      Results for Hannah [name], Hannah [name] (MRN [id number]) as of 09/06/2013 08:54  Ref. Range 09/06/2013 07:30 09/06/2013 08:10 09/06/2013 08:42  Glucose-Capillary Latest Range: 70-99 mg/dL 56 (L) 64 (L) 126 (H)     Hypoglycemic this AM after receiving 28 units Levemir last night.   MD- Please reduce Levemir to 24 units QHS   Will follow Wyn Quaker RN, MSN, CDE Diabetes Coordinator Inpatient Diabetes Program Team Pager: [phone] (8a-10p)

## 2013-09-06 NOTE — Progress Notes (Signed)
ANTICOAGULATION CONSULT NOTE - Follow Up Consult  Pharmacy Consult for Heparin Indication: Awaiting CABG  Allergies  Allergen Reactions  . Crestor [Rosuvastatin] Other (See Comments)    Severe muscle weakness  . Nsaids Other (See Comments)    Not allergic, "bad on my kidneys"  . Ciprofloxacin Rash    Patient Measurements: Height: 5' 7.5" (171.5 cm) Weight: 285 lb 8 oz (129.502 kg) IBW/kg (Calculated) : 62.75 Heparin Dosing Weight: 91 kg  Vital Signs: Temp: 98.5 F (36.9 C) (05/21 0500) Temp src: Oral (05/21 0500) BP: 117/68 mmHg (05/21 0500) Pulse Rate: 110 (05/21 0500)  Labs:  Recent Labs  09/03/13 1908 09/04/13 0143 09/04/13 0400  09/04/13 0825 09/05/13 0555 09/05/13 1625 09/06/13 0740  HGB  --   --  9.8*  --   --  9.5*  --  9.8*  HCT  --   --  31.5*  --   --  30.1*  --  30.9*  PLT  --   --  450*  --   --  364  --  379  APTT 32  --   --   --   --   --   --   --   LABPROT 13.9  --   --   --   --   --   --  14.3  INR 1.09  --   --   --   --   --   --  1.13  HEPARINUNFRC  --   --   --   < > 0.10* 0.15* 0.19* 0.58  CREATININE  --   --  4.38*  --   --  4.17*  --  4.28*  TROPONINI 0.45* 0.70*  --   --  0.45*  --   --   --   < > = values in this interval not displayed.  Estimated Creatinine Clearance: 23.5 ml/min (by C-G formula based on Cr of 4.28).   Assessment: Hep for 3VCAD for CABG: ICM, EF 30-35%. Not yet medically stable for CABG. Current heparin level is therapeutic. CBC is low-stable.   Goal of Therapy:  Heparin level 0.3-0.7 units/ml Monitor platelets by anticoagulation protocol: Yes   Plan:  Continue heparin at 2150 units/hr.  Recheck heparin level with AM labs.  Sloan Leiter, PharmD, BCPS Clinical Pharmacist [phone] 09/06/2013,10:39 AM

## 2013-09-06 NOTE — Progress Notes (Signed)
Subjective:  Patient looks good- CTS feels pt needs tuning up- diuresis/endarterectomy possible AVF  Vs dialysis prior to being able to undergo CABG  Objective Vital signs in last 24 hours: Filed Vitals:   09/05/13 0430 09/05/13 1357 09/05/13 2028 09/06/13 0500  BP: 109/66 114/63 116/74 117/68  Pulse: 101 110 106 110  Temp: 97.5 F (36.4 C) 97.9 F (36.6 C) 97.8 F (36.6 C) 98.5 F (36.9 C)  TempSrc: Oral Oral Oral Oral  Resp: 18 17 18 18   Height:      Weight: 127.2 kg (280 lb 6.8 oz)   129.502 kg (285 lb 8 oz)  SpO2: 94% 95% 94% 96%   Weight change: 2.302 kg (5 lb 1.2 oz)  Intake/Output Summary (Last 24 hours) at 09/06/13 0831 Last data filed at 09/06/13 0515  Gross per 24 hour  Intake 1522.63 ml  Output   2500 ml  Net -977.37 ml    Assessment/ Plan: Pt is a 43 y.o. yo female with advanced CKD at baseline who was admitted on 09/03/2013 with abnormal stress test in the setting of pre op for AVF Assessment/Plan: 1. Renal- advanced CKD at baseline followed by Dr. Marval Regal  due to DM.  Now with abnormal stress test and worsened EF and 3 vessel CAD.  Precautions used for contrast and so far so good.  I agree that she needs diuresis- I am not sure the weight she quoted as her baseline is correct- I dont think she has 60 pounds of fluid.  In any event- I have talked to her.  She is open to starting dialysis sooner rather than later.  I will talk to [facility] to see if they feel comfortable placing PC and AVF maybe early next week so we could start dialysis next week.  In the meantime, will increase lasix dosing.  2. Cards- as above, CTS consult done- pt too unstable right now for CABG-  On toprol and zocor 3. Anemia- hgb 9.5- iron stores low, have ordered iron and have added aranesp 4. Secondary hyperparathyroidism- will check PTH (pending) and phos 6.4- have added phoslo 5. HTN/volume- blood pressure is quite good.  Gentle hydration for contrast now has passed  I agree is volume overloaded  now on IV lasix - I can push the dose to have more successful diuresis- will add some potassium as well  Louis Meckel    Labs: Basic Metabolic Panel:  Recent Labs Lab 09/03/13 1352 09/04/13 0400 09/05/13 0555  NA 141 142 139  K 3.9 3.4* 3.3*  CL 100 100 98  CO2 25 27 27   GLUCOSE 172* 122* 109*  BUN 66* 64* 62*  CREATININE 4.46* 4.38* 4.17*  CALCIUM 8.0* 7.8* 7.7*  PHOS  --   --  6.6*   Liver Function Tests:  Recent Labs Lab 09/03/13 1352 09/04/13 0400 09/05/13 0555  AST 13 12  --   ALT 16 15  --   ALKPHOS 68 64  --   BILITOT <0.2* <0.2*  --   PROT 5.5* 5.3*  --   ALBUMIN 2.0* 1.9* 1.8*   No results found for this basename: LIPASE, AMYLASE,  in the last 168 hours No results found for this basename: AMMONIA,  in the last 168 hours CBC:  Recent Labs Lab 09/03/13 1352 09/04/13 0400 09/05/13 0555 09/06/13 0740  WBC 9.4 6.8 6.1 6.0  HGB 10.6* 9.8* 9.5* 9.8*  HCT 33.2* 31.5* 30.1* 30.9*  MCV 92.2 92.4 93.8 92.2  PLT 441* 450* 364  379   Cardiac Enzymes:  Recent Labs Lab 09/03/13 1908 09/04/13 0143 09/04/13 0825  TROPONINI 0.45* 0.70* 0.45*   CBG:  Recent Labs Lab 09/05/13 1213 09/05/13 1624 09/05/13 2030 09/06/13 0730 09/06/13 0810  GLUCAP 107* 142* 177* 56* 64*    Iron Studies:   Recent Labs  09/05/13 0555  IRON 44  TIBC 218*  FERRITIN 35   Studies/Results: Dg Chest 2 View  09/06/2013   CLINICAL DATA:  CHF, intermittent dyspnea  EXAM: CHEST  2 VIEW  COMPARISON:  Prior chest x-ray 08/13/2013; prior chest CT 08/22/2013  FINDINGS: Stable cardiac and mediastinal contours. Atherosclerotic calcifications are present within the transverse aorta. Moderate left and small right pleural effusions are similar compared to prior. There is persistent associated bibasilar atelectasis. Mild pulmonary vascular congestion without overt edema. No pneumothorax. No new focal airspace consolidation. No acute osseous abnormality.  IMPRESSION: 1. Stable  moderate left and small right pleural effusions and associated bibasilar atelectasis. 2. Pulmonary vascular congestion without overt edema.   Electronically Signed   By: Jacqulynn Cadet M.D.   On: 09/06/2013 07:55   Medications: Infusions: . heparin 2,150 Units/hr (09/06/13 0241)    Scheduled Medications: . aspirin EC  81 mg Oral Daily  . calcium acetate  667 mg Oral TID WC  . cholecalciferol  1,000 Units Oral Daily  . darbepoetin (ARANESP) injection - NON-DIALYSIS  60 mcg Subcutaneous Q Wed-1800  . ferric gluconate (FERRLECIT/NULECIT) IV  125 mg Intravenous Daily  . furosemide  80 mg Intravenous Q12H  . insulin aspart  0-9 Units Subcutaneous TID WC  . insulin detemir  28 Units Subcutaneous QHS  . metoprolol succinate  25 mg Oral Daily  . simvastatin  10 mg Oral q1800  . sodium chloride  3 mL Intravenous Q12H    have reviewed scheduled and prn medications.  Physical Exam: General: NAD Heart: RRR- slightly tachy Lungs:mostly clear Abdomen: obese, soft, non tender Extremities: pitting edema    09/06/2013,8:31 AM  LOS: 3 days

## 2013-09-06 NOTE — Progress Notes (Addendum)
   Patient with worsening dyspnea and severe orthopnea. Minimal response to IV lasix. CXR with worsening CHF. Central line place on the floor and co-ox back at 46%  Transferred to ICU. I spoke with Dr. Jimmy Footman who agreed with need for emergent CVVHD.   Trialysis catheter placed at bedside.   On exam.  90/60 110 Sats 96% 2L Markedly dyspneic. Having to sit straight uproght Cor Tachy regular. +s3 Lungs: diffuse crackles Ab: obese. Distended EXT: 3+ edema  A) 1. Acute respiratory failure      2. A/c systolic HF -> cardiogenic shock      3. A/c renal failure, stage V      4. CAD, multivessel  P: Will start emergent CVVHD. Can bridge with BIPAP as need until volume removed and hopefully we can avoid intubation. Support hemodynamically with levophed for low output.   The patient is critically ill with multiple organ systems failure and requires high complexity decision making for assessment and support, frequent evaluation and titration of therapies, application of advanced monitoring technologies and extensive interpretation of multiple databases.   Critical Care Time devoted to patient care services described in this note is 45 Minutes. Not including procedure time.   D/w patient and her husband.   Shaune Pascal Louellen Haldeman,MD 9:04 PM

## 2013-09-06 NOTE — Sedation Documentation (Signed)
PCXR done, per Dr Haroldine Laws CL placement acceptable for use

## 2013-09-06 NOTE — Progress Notes (Signed)
Patient ID: Hannah [name], female   DOB: 09/22/1970, 43 y.o.   MRN: [id number] Progressive SOB and dyspnea.  Cannot lie down and is dyspneic at rest. Discussed with Dr. Missy Sabins.  He will change out IJ line for 3L trialysis cath.  Discussed with patient pros and cons and she is ready to start HD.  Will use CRRT to get out of trouble then IHD.

## 2013-09-06 NOTE — Sedation Documentation (Signed)
Central Line Placed at bedside by Dr Haroldine Laws. 105mcg Fentanyl and 2mg  Versed given IV.  Patient tolerated well.  Husband at bedside post procedure.  Patient alert and communicating easily.  Moving normally

## 2013-09-06 NOTE — Progress Notes (Signed)
Patient Name: Hannah [name] Date of Encounter: 09/06/2013     Principal Problem:   Intermediate coronary syndrome Active Problems:   Nephrotic syndrome   Type II or unspecified type diabetes mellitus with neurological manifestations, not stated as uncontrolled   Hyperlipidemia   Essential hypertension, benign   Normocytic anemia   Chronic kidney disease (CKD), stage V   CHF (congestive heart failure)   CAD (coronary artery disease)    SUBJECTIVE  Some shortness of breath overnight when laying flat, no significant increase compare to before. Denies ever having any chest pain. Denies any dizziness. She was seen by CT surgery team yesterday.   CURRENT MEDS . aspirin EC  81 mg Oral Daily  . calcium acetate  667 mg Oral TID WC  . cholecalciferol  1,000 Units Oral Daily  . darbepoetin (ARANESP) injection - NON-DIALYSIS  60 mcg Subcutaneous Q Wed-1800  . ferric gluconate (FERRLECIT/NULECIT) IV  125 mg Intravenous Daily  . furosemide  80 mg Intravenous Q12H  . insulin aspart  0-9 Units Subcutaneous TID WC  . insulin detemir  28 Units Subcutaneous QHS  . metoprolol succinate  25 mg Oral Daily  . simvastatin  10 mg Oral q1800  . sodium chloride  3 mL Intravenous Q12H    OBJECTIVE  Filed Vitals:   09/05/13 0430 09/05/13 1357 09/05/13 2028 09/06/13 0500  BP: 109/66 114/63 116/74 117/68  Pulse: 101 110 106 110  Temp: 97.5 F (36.4 C) 97.9 F (36.6 C) 97.8 F (36.6 C) 98.5 F (36.9 C)  TempSrc: Oral Oral Oral Oral  Resp: 18 17 18 18   Height:      Weight: 280 lb 6.8 oz (127.2 kg)   285 lb 8 oz (129.502 kg)  SpO2: 94% 95% 94% 96%    Intake/Output Summary (Last 24 hours) at 09/06/13 0811 Last data filed at 09/06/13 0515  Gross per 24 hour  Intake 1822.63 ml  Output   2500 ml  Net -677.37 ml   Filed Weights   09/04/13 0500 09/05/13 0430 09/06/13 0500  Weight: 274 lb 1.6 oz (124.331 kg) 280 lb 6.8 oz (127.2 kg) 285 lb 8 oz (129.502 kg)    PHYSICAL EXAM  General:  Pleasant, NAD. Neuro: Alert and oriented X 3. Moves all extremities spontaneously. Psych: Normal affect. HEENT:  Normal  Neck: Supple without bruits. Unable to assess JVD due to size. Lungs:  Resp regular and unlabored, CTA. Heart: tachy regular. no s3, s4, or murmurs. Abdomen: Soft, non-tender, non-distended, BS + x 4.  Extremities: No clubbing and cyanosis. DP/PT/Radials 2+ and equal bilaterally. 3+ pitting edema in bilateral LE, R LE appears to be slightly red.  Accessory Clinical Findings  CBC  Recent Labs  09/05/13 0555 09/06/13 0740  WBC 6.1 6.0  HGB 9.5* 9.8*  HCT 30.1* 30.9*  MCV 93.8 92.2  PLT 364 287   Basic Metabolic Panel  Recent Labs  09/04/13 0400 09/05/13 0555  NA 142 139  K 3.4* 3.3*  CL 100 98  CO2 27 27  GLUCOSE 122* 109*  BUN 64* 62*  CREATININE 4.38* 4.17*  CALCIUM 7.8* 7.7*  PHOS  --  6.6*   Liver Function Tests  Recent Labs  09/03/13 1352 09/04/13 0400 09/05/13 0555  AST 13 12  --   ALT 16 15  --   ALKPHOS 68 64  --   BILITOT <0.2* <0.2*  --   PROT 5.5* 5.3*  --   ALBUMIN 2.0* 1.9* 1.8*  Cardiac Enzymes  Recent Labs  09/03/13 1908 09/04/13 0143 09/04/13 0825  TROPONINI 0.45* 0.70* 0.45*   Thyroid Function Tests  Recent Labs  09/05/13 1300  TSH 1.240    TELE  Sinus tachycardia since admission, HR high 90s-110s. No significant ventricular ectopy  ECG  Sinus tachycardia with HR 107 and poor R wave progression in anterior leads  Radiology/Studies  Dg Chest 2 View  09/06/2013   CLINICAL DATA:  CHF, intermittent dyspnea  EXAM: CHEST  2 VIEW  COMPARISON:  Prior chest x-ray 08/13/2013; prior chest CT 08/22/2013  FINDINGS: Stable cardiac and mediastinal contours. Atherosclerotic calcifications are present within the transverse aorta. Moderate left and small right pleural effusions are similar compared to prior. There is persistent associated bibasilar atelectasis. Mild pulmonary vascular congestion without overt edema. No  pneumothorax. No new focal airspace consolidation. No acute osseous abnormality.  IMPRESSION: 1. Stable moderate left and small right pleural effusions and associated bibasilar atelectasis. 2. Pulmonary vascular congestion without overt edema.   Electronically Signed   By: Jacqulynn Cadet M.D.   On: 09/06/2013 07:55   Ct Chest Wo Contrast  08/22/2013   CLINICAL DATA:  Left lung cancer status post lobectomy. Weight gain. Renal insufficiency.  EXAM: CT CHEST WITHOUT CONTRAST  TECHNIQUE: Multidetector CT imaging of the chest was performed following the standard protocol without IV contrast.  COMPARISON:  Radiographs 08/13/2013 and 08/04/2013.  CT 08/02/2012.  FINDINGS: There are stable postsurgical changes related to prior left lower lobe resection. There has been interval enlargement of several mediastinal lymph nodes. These include 12 mm right paratracheal (image 19), 13 mm precarinal (image 23), and 14 mm AP window (image 25) lymph nodes. Some of these nodes have retained fatty hila. Allowing for the limitations of noncontrast technique, the hila appear stable.  There is stable low-density within the right thyroid lobe. Atherosclerosis of the aorta, great vessels and coronary arteries is noted. The heart size is normal. There is no pericardial effusion.  Moderate size dependent pleural effusions are present bilaterally. There is associated dependent airspace disease in both lower lobes. In addition, there are more focal airspace opacities within the right lower lobe and inferior aspect of the left upper lobe. The latter is somewhat nodular, measuring up to 1.8 cm on image 43. No other focal nodularity or endobronchial lesions are demonstrated.  The visualized upper abdomen has a stable appearance. There is no adrenal mass. There is increased subcutaneous edema throughout the subcutaneous fat, especially within the anterior aspect of the upper abdomen.  No worrisome osseous findings are demonstrated.  IMPRESSION:  1. As demonstrated radiographically, there are bilateral pleural effusions and bilateral airspace opacities which are new compared with the prior CT. Although there are focal somewhat nodular components in the right lower and left upper lobes, these findings are most likely infectious/inflammatory. 2. Progressive mediastinal lymphadenopathy. Some of the nodes have retained fatty hila and may be reactive. Metastatic disease cannot be completely excluded. 3. Stable atherosclerosis and low-density within the right thyroid lobe. 4. If the airspace opacities and pleural effusions fail to respond to appropriate clinical therapy, follow-up CT or PET-CT may be warranted to exclude metastatic disease.   Electronically Signed   By: Camie Patience M.D.   On: 08/22/2013 10:26   Dg Chest Portable 1 View  08/13/2013   CLINICAL DATA:  Shortness of breath increasing for 2 weeks. History of CHF.  EXAM: PORTABLE CHEST - 1 VIEW  COMPARISON:  08/04/2013  FINDINGS: Again noted are bibasilar  lung densities that are suggestive for pleural effusions and atelectasis. Heart size appears to be enlarged. There is some peribronchial thickening and cannot exclude pulmonary edema.  IMPRESSION: Bilateral pleural effusions with basilar atelectasis. There is mild pulmonary edema. Minimal change from the previous examination.   Electronically Signed   By: Markus Daft M.D.   On: 08/13/2013 23:10    ASSESSMENT AND PLAN  43 yo morbidly obese female with PMH of stage V kidney disease currently being considered for a-v fistula placement and CAD without stent who was recently discharged for heart failure symptom had a high risk stress test which showed ischemia in anterior and apical region. She underwent cath 09/04/2013 which showed chronically occluded RCA and triple vessel dx. CT surgery consulted  1. Multivessel CAD by cath - Cath 09/04/2013 100% RCA occlusion with L to R collaterals, 80% ostial LAD, 70-80% diffuse mid LAD, 100% D1 & D2, 50% mid  LCx, 90% prox OM1, 50% OM2 - CT surgery consulted, pre-op workup currently underway (PFT yesterday, vein mapping today) - per CT surgery recommendation, will consult Vascular/Dr. Trula Slade (for L carotid endarterectomy) and Heart Failure team - Discussed case with Dr. Bridgett Larsson of Vascular (regarding L carotid stenosis, perma-cath and av fistula placement) - PFT done yesterday shows severe COPD which may make her difficult to wean from ventilator, pending LE vein mapping today  2. Acute systolic dysfunction 2/2 likely ischemic cardiomyopathy  - Echo 08/10/2012 55-60%  - Nuc stress 08/2013 EF 22%  - consulted advanced HF (discussed with Dr. Haroldine Laws) service to manage fluid status.  3. Elevated troponin  - likely due to a combination of CKD and underlying ischemia   4. Stage V CKD  - Diuresis due to increased LVEDP.  - Nephrology consulted, previously evaluated for AV fistula placement, given potential surgery may need placement of perma-cath to bridge surgery with future dialysis - replete K, continue to monitor Cr - lasix increased by nephrology  5. Normocytic Anemia  - appears to be chronic, 2/2 CKD  - changed from 10.6 to 9.8, continue to monitor  - patient denies any bleeding problem   6. Tachycardia  - likely related to low EF  7. L Carotid stenosis  - doppler 08/31/2013 shows R 60-79% ICA stenosis, L 80-99% ICA stenosis - will consult Vascular team per CT surgery recommendation  8. Acute on chronic respiratory failure 2/2 COPD, CHF and h/o LLL lobectomy (2 yr ago due to non-small cell carcinoma) - PFT 09/05/2013 pre-FEV1 28%, pre-FVC 34%, pre-FEV1/FVC ratio [address]  Hilbert Corrigan PA Pager # [phone]  Patient seen and examined with Almyra Deforest, PA-C. We discussed all aspects of the encounter. I agree with the assessment and plan as stated above. She remains significantly volume overloaded and tachycardic. LVEDP on cath 38. Urine output picking up with IV lasix put still has a way to go. I  have spoken to Dr. Moshe Cipro who plans to increase lasix today. If response not adequate would advocate for low threshold to place trialysis cath (or PC) tomorrow and begin volume removal (we are happy to place trialysis cath as needed). I will add metolazone. She may have component of low output and we can consider inotropes as needed. Will need RHC after volume removed to make sure we have her optimized prior to high-risk CABG. HF team to follow.   Shaune Pascal Bensimhon,MD 11:26 AM

## 2013-09-06 NOTE — Consult Note (Signed)
Hospital Consult  Reason for Consult:  In need of permanent HD access & diatek as well as possible carotid intervention  Referring Physician:  Moshe Cipro  MRN #:  [id number]  History of Present Illness: This is a 43 y.o. female who was admitted to the hospital on 09/03/13 who is in the process of being worked up for HD access by Dr. Trula Slade.  She was sent for a stress test as part of this workup, which revealed anterior and apical ischemia with an EF of 22%.  This was significantly different that her previous stress test and was admitted for admission due to the high risk nature of her stress test.  She does not have chest pain or pressure, but does have worsening orthopnea and SOB with exertion.  She also states that her edema has worsened.  She does have a hx of stage I lung cancer for which she underwent a left lower lobectomy in 2013.  At that time, she was found to have a greater than 80% left carotid artery stenosis and 60-79% right carotid artery stenosis.  At follow up, her repeat carotid duplex revealed 60-79% bilaterally.  She was supposed to f/u in 6 months, but did not present until a week ago with worsening renal function in need of permanent access.   Dr. Trula Slade suspects the pt has a left subclavian stenosis given the monophasic left radial waveforms.  Because of this, he does not recommend left sided access.  He has recommended a right radio-cephalic vs brachiocephalic AVF.   During that same visit, he wanted to get a carotid duplex to evaluate her carotid artery stenosis and was supposed to get an [name] later in the week, however, she has presented to the hospital d/t her abnormal stress test. ABI's performed yesterday reveal the right is 1.11 and the left is 0.96.  She underwent cardiac catheterization on 09/04/13 and was found to have MVCAD and is in need of CABG and CT surgery is consulted.  She is on insulin for her diabetes and is being followed by the diabetic coordinator.  She  is on a statin for her cholesterol.  She is also on a beta blocker.  She does have an extensive hx of tobacco use.  She has been quit for 2 years now.  Past Medical History  Diagnosis Date  . High cholesterol   . Diabetic retinopathy   . Peripheral neuropathy     "tips of toes"  . Blind right eye   . CHF (congestive heart failure)   . CAD (coronary artery disease)   . GERD (gastroesophageal reflux disease)   . Hypertension   . History of lung cancer 07/2011    s/p left lower lobectomy  . Diabetes mellitus     IDDM  . Cataract of right eye   . Ulcer of toe of right foot 07/10/2012    great toe  . Breast calcification, left 06/2012  . Acute biphenotypic leukemia   . CKD (chronic kidney disease) stage 5, GFR less than 15 ml/min   . Nephrotic syndrome   . Gastritis     H/o gastritis on prior endoscopy  . Anemia   . Carotid artery disease    Past Surgical History  Procedure Laterality Date  . Cardiac catheterization  07/16/2011  . Incision and drainage breast abscess Left   . Tubal ligation  1994  . Vitrectomy  2010    2 on left, 1 on right  . Cesarean section  1991;  1994  . Video assisted thoracoscopy (vats)/ lobectomy Left 07/30/2011    left main thoracotomy, left lower lobectomy, mediastinal lymph node dissection  . Lobectomy    . Colonoscopy with esophagogastroduodenoscopy (egd) N/A 08/14/2012    Procedure: COLONOSCOPY WITH ESOPHAGOGASTRODUODENOSCOPY (EGD);  Surgeon: Danie Binder, MD;  Location: [facility];  Service: Endoscopy;  Laterality: N/A;  10:45-moved to [address] to notify pt  . Breast lumpectomy with needle localization Left 11/14/2012    Procedure: BREAST LUMPECTOMY WITH NEEDLE LOCALIZATION;  Surgeon: Marcello Moores A. Cornett, MD;  Location: [facility];  Service: General;  Laterality: Left;    Allergies  Allergen Reactions  . Crestor [Rosuvastatin] Other (See Comments)    Severe muscle weakness  . Nsaids Other (See Comments)    Not allergic, "bad on my kidneys"    . Ciprofloxacin Rash    Prior to Admission medications   Medication Sig Start Date End Date Taking? Authorizing Provider  acetaminophen (TYLENOL) 500 MG tablet Take 500 mg by mouth every 8 (eight) hours as needed for mild pain.   Yes Historical Provider, MD  aspirin EC 81 MG EC tablet Take 1 tablet (81 mg total) by mouth daily. 08/17/13  Yes Belkys A Regalado, MD  cholecalciferol (VITAMIN D) 1000 UNITS tablet Take 1,000 Units by mouth daily.   Yes Historical Provider, MD  cyclobenzaprine (FLEXERIL) 10 MG tablet Take 5-10 mg by mouth 3 (three) times daily as needed for muscle spasms. 07/06/13  Yes Alycia Rossetti, MD  Insulin Detemir (LEVEMIR FLEXPEN) 100 UNIT/ML Pen Inject 34 Units into the skin every evening. 08/17/13  Yes Belkys A Regalado, MD  insulin lispro (HUMALOG KWIKPEN) 100 UNIT/ML KiwkPen Inject 0.03 mLs (3 Units total) into the skin 3 (three) times daily with meals. 08/17/13  Yes Belkys A Regalado, MD  methylcellulose (ARTIFICIAL TEARS) 1 % ophthalmic solution Place 1 drop into both eyes at bedtime as needed (dry eyes).   Yes Historical Provider, MD  metolazone (ZAROXOLYN) 5 MG tablet Take 1 tablet (5 mg total) by mouth daily as needed. 08/17/13  Yes Belkys A Regalado, MD  metoprolol succinate (TOPROL-XL) 25 MG 24 hr tablet Take 1 tablet (25 mg total) by mouth daily. 07/08/13  Yes Alycia Rossetti, MD  potassium chloride SA (K-DUR,KLOR-CON) 20 MEQ tablet Take 1 tablet (20 mEq total) by mouth 2 (two) times daily. 08/17/13  Yes Belkys A Regalado, MD  pravastatin (PRAVACHOL) 20 MG tablet Take 1 tablet (20 mg total) by mouth daily at 6 PM. 08/17/13  Yes Belkys A Regalado, MD  torsemide (DEMADEX) 20 MG tablet Take 2 tablets (40 mg total) by mouth 2 (two) times daily. 08/17/13  Yes Elmarie Shiley, MD    History   Social History  . Marital Status: Married    Spouse Name: N/A    Number of Children: 3  . Years of Education: N/A   Occupational History  . Not on file.   Social History Main Topics   . Smoking status: Former Smoker -- 2.00 packs/day for 30 years    Quit date: 08/01/2011  . Smokeless tobacco: Never Used  . Alcohol Use: No  . Drug Use: No  . Sexual Activity: Yes   Other Topics Concern  . Not on file   Social History Narrative  . No narrative on file     Family History  Problem Relation Age of Onset  . Coronary artery disease Father   . Asthma Father   . COPD Father   .  Hypertension Father   . Hyperlipidemia Father   . Diabetes Father   . Congestive Heart Failure Father   . Heart disease Father     before age 66  . Heart attack Father   . Peripheral vascular disease Father   . Hypertension Mother   . Hyperlipidemia Mother   . Diabetes Mother   . Cancer Mother   . Cancer Maternal Aunt      three aunts, bone, breast, ?  . Hypertension Brother   . Diabetes Brother   . Diabetes Sister   . Colon cancer Neg Hx   . Celiac disease Neg Hx   . Crohn's disease Neg Hx   . Ulcerative colitis Neg Hx   . Lung cancer Maternal Grandmother     ROS: [x]  Positive   [ ]  Negative   [ ]  All sytems reviewed and are negative  Cardiovascular: []  chest pain/pressure []  palpitations []  SOB lying flat [x]  DOE [x]  + orthopnea []  pain in legs while walking []  pain in legs at rest []  pain in legs at night []  non-healing ulcers []  hx of DVT []  swelling in legs  Pulmonary: []  productive cough []  asthma/wheezing []  home O2  Neurologic: []  weakness in []  arms []  legs []  numbness in []  arms []  legs []  hx of CVA []  mini stroke [] difficulty speaking or slurred speech []  temporary loss of vision in one eye []  dizziness  Hematologic: [x]  hx of cancer-lung cancer with resection []  bleeding problems []  problems with blood clotting easily  Endocrine:  [x]  diabetes []  thyroid disease [x]  diabetic retinopathy  GI []  vomiting blood []  blood in stool  GU: [x]  CKD/renal failure []  HD--[]  M/W/F or []  T/T/S []  burning with urination []  blood in  urine  Psychiatric: []  anxiety []  depression  Musculoskeletal: []  arthritis []  joint pain  Integumentary: []  rashes [x]  ulcers-right great toe  Constitutional: []  fever []  chills   Physical Examination  Filed Vitals:   09/06/13 0500  BP: 117/68  Pulse: 110  Temp: 98.5 F (36.9 C)  Resp: 18   Body mass index is 44.03 kg/(m^2).  General:  WDWN in NAD Gait: Not observed HENT: WNL, normocephalic Eyes: Pupils equal Pulmonary: normal non-labored breathing, without Rales, rhonchi,  wheezing Cardiac: regular, without  Murmurs, rubs or gallops; with left carotid bruits Abdomen: soft, NT/ND, no masses Skin: without rashes, with ulcer right great toe Vascular Exam/Pulses:  Right Left  Radial 1+ (weak) absent  Ulnar Non palpable Non palpable  Popliteal Non palpable Non palpable  DP 2+ (normal) Unable to palpate  PT Unable to palpate Unable to palpate   Extremities: without ischemic changes, without Gangrene , without cellulitis; without open wounds; + edema BLE Musculoskeletal: no muscle wasting or atrophy  Neurologic: A&O X 3; Appropriate Affect ; SENSATION: normal; MOTOR FUNCTION:  moving all extremities equally. Speech is fluent/normal Psychiatric:  Normal affect and able to make her own decisions Lymph:  No inguinal lymphadenopathy   CBC    Component Value Date/Time   WBC 6.0 09/06/2013 0740   RBC 3.35* 09/06/2013 0740   HGB 9.8* 09/06/2013 0740   HCT 30.9* 09/06/2013 0740   PLT 379 09/06/2013 0740   MCV 92.2 09/06/2013 0740   MCH 29.3 09/06/2013 0740   MCHC 31.7 09/06/2013 0740   RDW 13.8 09/06/2013 0740   LYMPHSABS 1.4 08/04/2013 1223   MONOABS 0.4 08/04/2013 1223   EOSABS 0.2 08/04/2013 1223   BASOSABS 0.1 08/04/2013 1223    BMET  Component Value Date/Time   NA 141 09/06/2013 0740   K 3.4* 09/06/2013 0740   CL 98 09/06/2013 0740   CO2 28 09/06/2013 0740   GLUCOSE 53* 09/06/2013 0740   BUN 58* 09/06/2013 0740   CREATININE 4.28* 09/06/2013 0740   CREATININE  4.17* 07/06/2013 1618   CALCIUM 8.2* 09/06/2013 0740   GFRNONAA 12* 09/06/2013 0740   GFRNONAA 71 07/13/2011 0835   GFRAA 13* 09/06/2013 0740   GFRAA 82 07/13/2011 0835    COAGS: Lab Results  Component Value Date   INR 1.13 09/06/2013   INR 1.09 09/03/2013   INR 0.89 12/04/2012   Radiology: Dg Chest 2 View  09/06/2013   CLINICAL DATA:  CHF, intermittent dyspnea  EXAM: CHEST  2 VIEW  COMPARISON:  Prior chest x-ray 08/13/2013; prior chest CT 08/22/2013  FINDINGS: Stable cardiac and mediastinal contours. Atherosclerotic calcifications are present within the transverse aorta. Moderate left and small right pleural effusions are similar compared to prior. There is persistent associated bibasilar atelectasis. Mild pulmonary vascular congestion without overt edema. No pneumothorax. No new focal airspace consolidation. No acute osseous abnormality.  IMPRESSION: 1. Stable moderate left and small right pleural effusions and associated bibasilar atelectasis. 2. Pulmonary vascular congestion without overt edema.   Electronically Signed   By: Jacqulynn Cadet M.D.   On: 09/06/2013 07:55    Non-Invasive Vascular Imaging:    Carotid Findings: Performed 08/31/2013 at another facility. Right 60-79% internal carotid artery stenosis, left 80-99% internal carotid artery stenosis. Vertebral arteries are patent with antegrade flow. More details can be found in [facility].  Upper Extremity  Right  Left   Brachial Pressures  118-Triphasic  103-Monophasic   Radial Waveforms  Triphasic  Monophasic   Ulnar Waveforms  Monophasic  Dampened monophasic   Palmar Arch (Allen's Test)  Signal is unaffected with both radial and ulnar compression.  Signal obliterates with radial compression, decreases 50% with ulnar compression.    Lower Extremity  Right  Left   Dorsalis Pedis  131-Biphasic  111-Monophasic   Anterior Tibial     Posterior Tibial  127-Biphasic  113-Biphasic   Ankle/Brachial Indices  1.11  0.96   Findings:  Bilateral  ABIs are within normal limits.  Statin:  yes Beta Blocker:  yes Aspirin:  yes ACEI:  no ARB:  no Other antiplatelets/anticoagulants:  yes-heparin gtt   ASSESSMENTPLAN:: This is a 43 y.o. female with multiple high grade medical problem including:  1. Heart failure 2. CAD requiring CABG 3. Asx L ICA stenosis > 80% 4. CKD Stage V expected to become ESRD after CABG 5. Poorly DM 6. S/p LL lobectomy for lung cancer  -  Dr. Trula Slade has been managing this patient for her carotid stenosis and CKD V. -  The patient appears to be approaching ESRD rapidly, so [facility] placement and R arm AVF placement will need to scheduled once medical stable. -  In regards to combined CABG and carotid, the most recent literature is not supportive of combined procedures, so the most symptomatic disease process should be addressed first. -  At some point, she will need a L CEA to lower her risk of CVA given her asx LICA stenosis >65% -  Dr. Trula Slade to see pt tomorrow to sort out the sequencing of her needed procedures  Leontine Locket, PA-C [facility] [phone]  Addendum  I have independently interviewed and examined the patient, and I agree with the physician assistant's findings.  Pt is completely asx in regards  to her L ICA stenosis >80%.  Currently, she appears to be most sx from her imminent ESRD which will requiring HD.  I would proceed with TDC and R arm fistula placement first.   In regards to the Carotid / CABG controversy, I do not routinely advocate combine procedures given the more recent series published.  I am going to defer to Dr. Trula Slade the sequencing of the CABG vs L CEA.  Adele Barthel, MD [facility] [place] Office: [phone] Pager: [phone]  09/06/2013, 4:23 PM

## 2013-09-06 NOTE — Procedures (Signed)
Central Venous Catheter Insertion Procedure Note Hannah [name] [id number] 1970/11/29  Procedure: Insertion of Central Venous Trialysis catheter Indications: CVVHD  Procedure Details Consent: Risks of procedure as well as the alternatives and risks of each were explained to the (patient/caregiver).  Consent for procedure obtained. Time Out: Verified patient identification, verified procedure, site/side was marked, verified correct patient position, special equipment/implants available, medications/allergies/relevent history reviewed, required imaging and test results available.  Performed  Maximum sterile technique was used including antiseptics, cap, gloves, gown, hand hygiene, mask and sheet. The pre-existing RIG triple lumen catheter and RIJ area was cleaned copiously with cholorhexidine.  A wire was placed through the pre-existing RIJ TLC and the catheter was removed over the wire. Successive dilations were then carried out and a Trialysis catheter was placed in the RIJ using a ,odified Seldinger technique.  Evaluation Blood flow good Complications: No apparent complications Patient did tolerate procedure well. Chest X-ray ordered to verify placement.  CXR: pending.  Shaune Pascal Sigurd Pugh MD 09/06/2013, 8:56 PM

## 2013-09-07 DIAGNOSIS — I6529 Occlusion and stenosis of unspecified carotid artery: Secondary | ICD-10-CM

## 2013-09-07 LAB — RENAL FUNCTION PANEL
Albumin: 2.1 g/dL — ABNORMAL LOW (ref 3.5–5.2)
Albumin: 2.3 g/dL — ABNORMAL LOW (ref 3.5–5.2)
BUN: 43 mg/dL — ABNORMAL HIGH (ref 6–23)
BUN: 29 mg/dL — ABNORMAL HIGH (ref 6–23)
CALCIUM: 8.1 mg/dL — AB (ref 8.4–10.5)
CHLORIDE: 100 meq/L (ref 96–112)
CHLORIDE: 97 meq/L (ref 96–112)
CO2: 26 meq/L (ref 19–32)
CO2: 25 mEq/L (ref 19–32)
CREATININE: 3.01 mg/dL — AB (ref 0.50–1.10)
CREATININE: 2.46 mg/dL — AB (ref 0.50–1.10)
Calcium: 7.9 mg/dL — ABNORMAL LOW (ref 8.4–10.5)
GFR calc Af Amer: 20 mL/min — ABNORMAL LOW (ref >90)
GFR calc Af Amer: 26 mL/min — ABNORMAL LOW (ref >90)
GFR calc non Af Amer: 18 mL/min — ABNORMAL LOW (ref >90)
GFR, EST NON AFRICAN AMERICAN: 23 mL/min — AB (ref >90)
Glucose, Bld: 165 mg/dL — ABNORMAL HIGH (ref 70–99)
Glucose, Bld: 239 mg/dL — ABNORMAL HIGH (ref 70–99)
PHOSPHORUS: 4.2 mg/dL (ref 2.3–4.6)
Phosphorus: 4.8 mg/dL — ABNORMAL HIGH (ref 2.3–4.6)
Potassium: 4.3 mEq/L (ref 3.7–5.3)
Potassium: 4.8 mEq/L (ref 3.7–5.3)
SODIUM: 138 meq/L (ref 137–147)
Sodium: 135 mEq/L — ABNORMAL LOW (ref 137–147)

## 2013-09-07 LAB — POCT ACTIVATED CLOTTING TIME
ACTIVATED CLOTTING TIME: 199 s
ACTIVATED CLOTTING TIME: 204 s
ACTIVATED CLOTTING TIME: 221 s
ACTIVATED CLOTTING TIME: 204 s
ACTIVATED CLOTTING TIME: 210 s
ACTIVATED CLOTTING TIME: 216 s
ACTIVATED CLOTTING TIME: 227 s
Activated Clotting Time: 188 seconds
Activated Clotting Time: 199 seconds
Activated Clotting Time: 166 seconds
Activated Clotting Time: 166 seconds
Activated Clotting Time: 188 seconds
Activated Clotting Time: 193 seconds
Activated Clotting Time: 193 seconds
Activated Clotting Time: 199 seconds
Activated Clotting Time: 204 seconds

## 2013-09-07 LAB — APTT: aPTT: >200 seconds (ref 24–37)

## 2013-09-07 LAB — MAGNESIUM: Magnesium: 2.2 mg/dL (ref 1.5–2.5)

## 2013-09-07 LAB — HEPARIN LEVEL (UNFRACTIONATED)
Heparin Unfractionated: 0.96 IU/mL — ABNORMAL HIGH (ref 0.30–0.70)
Heparin Unfractionated: 0.57 IU/mL (ref 0.30–0.70)

## 2013-09-07 LAB — GLUCOSE, CAPILLARY
GLUCOSE-CAPILLARY: 125 mg/dL — AB (ref 70–99)
GLUCOSE-CAPILLARY: 185 mg/dL — AB (ref 70–99)
GLUCOSE-CAPILLARY: 271 mg/dL — AB (ref 70–99)
Glucose-Capillary: 141 mg/dL — ABNORMAL HIGH (ref 70–99)
Glucose-Capillary: 268 mg/dL — ABNORMAL HIGH (ref 70–99)

## 2013-09-07 LAB — CBC
HCT: 30.8 % — ABNORMAL LOW (ref 36.0–46.0)
HEMOGLOBIN: 9.6 g/dL — AB (ref 12.0–15.0)
MCH: 29.2 pg (ref 26.0–34.0)
MCHC: 31.2 g/dL (ref 30.0–36.0)
MCV: 93.6 fL (ref 78.0–100.0)
Platelets: 335 10*3/uL (ref 150–400)
RBC: 3.29 MIL/uL — AB (ref 3.87–5.11)
RDW: 13.9 % (ref 11.5–15.5)
WBC: 9 10*3/uL (ref 4.0–10.5)

## 2013-09-07 LAB — CARBOXYHEMOGLOBIN
CARBOXYHEMOGLOBIN: 1.7 % — AB (ref 0.5–1.5)
Carboxyhemoglobin: 1.4 % (ref 0.5–1.5)
Methemoglobin: 0.9 % (ref 0.0–1.5)
Methemoglobin: 0.7 % (ref 0.0–1.5)
O2 SAT: 46.6 %
O2 SAT: 79.4 %
TOTAL HEMOGLOBIN: 10.3 g/dL — AB (ref 12.0–16.0)
Total hemoglobin: 11.7 g/dL — ABNORMAL LOW (ref 12.0–16.0)

## 2013-09-07 MED ORDER — MIDAZOLAM HCL 2 MG/2ML IJ SOLN
1.0000 mg | Freq: Four times a day (QID) | INTRAMUSCULAR | Status: DC | PRN
  Administered 2013-09-07 – 2013-10-01 (×5): 1 mg via INTRAVENOUS
  Filled 2013-09-07 (×5): qty 2

## 2013-09-07 MED ORDER — CALCITRIOL 0.25 MCG PO CAPS
0.2500 ug | ORAL_CAPSULE | Freq: Every day | ORAL | Status: DC
  Administered 2013-09-07 – 2013-09-17 (×10): 0.25 ug via ORAL
  Filled 2013-09-07 (×12): qty 1

## 2013-09-07 NOTE — Progress Notes (Signed)
Pt report received from Archie for Pt transfer from [facility] to 2305 with all questions answered, Pt assisted with transfer in recliner chair and 2H RN with monitor and IV pole, Pt introduced to unit and staff and P.O.C. discussed r/t resuming CRRT, Pt tolerated transfer and stated understanding of P.O.C.

## 2013-09-07 NOTE — Progress Notes (Signed)
ADVANCED HEART FAILURE ROUNDING NOTE   SUBJECTIVE  43 yo morbidly obese female with PMH of DM1, stage V kidney disease, blindness, h/o lung CA s/p resection who was recently discharged for heart failure symptom had a high risk stress test which showed ischemia in anterior and apical region. She underwent cath 09/04/2013 which showed chronically occluded RCA and triple vessel dx. CT surgery consulted. EF 22% by Myoview. 30-35% by echo   Transferred to ICU last night due to acute respiratory distress. Trialysis catheter placed and started on CVVHD. Respiratory status improved some.  Still dyspneic and orthopneic. Co-ox 47%    CURRENT MEDS . aspirin EC  81 mg Oral Daily  . calcitRIOL  0.25 mcg Oral Daily  . calcium acetate  667 mg Oral TID WC  . cholecalciferol  1,000 Units Oral Daily  . darbepoetin (ARANESP) injection - NON-DIALYSIS  60 mcg Subcutaneous Q Wed-1800  . fentaNYL  25 mcg Intravenous Once  . fentaNYL  50 mcg Intravenous Once  . insulin aspart  0-9 Units Subcutaneous TID WC  . insulin detemir  28 Units Subcutaneous QHS  . lidocaine (PF)  5 mL Other Once  . midazolam  2 mg Intravenous Once  . midazolam  2 mg Intravenous Once  . simvastatin  10 mg Oral q1800  . sodium chloride  3 mL Intravenous Q12H    OBJECTIVE  Filed Vitals:   09/07/13 0730 09/07/13 0800 09/07/13 0830 09/07/13 0900  BP: 88/64 106/64 88/26 101/60  Pulse: 107 106 104 101  Temp:      TempSrc:      Resp: 14 12 12 13   Height:      Weight:      SpO2: 100% 100% 100% 100%    Intake/Output Summary (Last 24 hours) at 09/07/13 0958 Last data filed at 09/07/13 0900  Gross per 24 hour  Intake 851.88 ml  Output   4338 ml  Net -3486.12 ml   Filed Weights   09/05/13 0430 09/06/13 0500 09/07/13 0700  Weight: 127.2 kg (280 lb 6.8 oz) 129.502 kg (285 lb 8 oz) 127.3 kg (280 lb 10.3 oz)    PHYSICAL EXAM  General: Pleasant, NAD. A bit groggy Neuro: Alert and oriented X 3. Moves all extremities  spontaneous HEENT:  Normal  Neck: Supple without bruits. Unable to assess RIJ trialysis catheter Lungs:  +crackles Heart: tachy regular. no s3, s4, +s3 Abdomen: Soft, non-tender, non-distended, BS + x 4.  Extremities: No clubbing and cyanosis. DP/PT/Radials 2+ and equal bilaterally. 3+ pitting edema in bilateral LE, R LE appears to be slightly red.    CBC  Recent Labs  09/06/13 0740 09/07/13 0338  WBC 6.0 9.0  HGB 9.8* 9.6*  HCT 30.9* 30.8*  MCV 92.2 93.6  PLT 379 119   Basic Metabolic Panel  Recent Labs  09/05/13 0555 09/06/13 0740 09/07/13 0338  NA 139 141 138  K 3.3* 3.4* 4.3  CL 98 98 100  CO2 27 28 26   GLUCOSE 109* 53* 165*  BUN 62* 58* 43*  CREATININE 4.17* 4.28* 3.01*  CALCIUM 7.7* 8.2* 7.9*  MG  --   --  2.2  PHOS 6.6*  --  4.8*   Liver Function Tests  Recent Labs  09/06/13 0740 09/07/13 0338  AST 13  --   ALT 12  --   ALKPHOS 62  --   BILITOT <0.2*  --   PROT 5.4*  --   ALBUMIN 2.1* 2.1*   Cardiac Enzymes No results  found for this basename: CKTOTAL, CKMB, CKMBINDEX, TROPONINI,  in the last 72 hours Thyroid Function Tests  Recent Labs  09/05/13 1300  TSH 1.240    TELE  Sinus tachycardia since admission, HR high 90s-110s. No significant ventricular ectopy  ECG  Sinus tachycardia with HR 107 and poor R wave progression in anterior leads  Radiology/Studies  Dg Chest 2 View  09/06/2013   CLINICAL DATA:  CHF, intermittent dyspnea  EXAM: CHEST  2 VIEW  COMPARISON:  Prior chest x-ray 08/13/2013; prior chest CT 08/22/2013  FINDINGS: Stable cardiac and mediastinal contours. Atherosclerotic calcifications are present within the transverse aorta. Moderate left and small right pleural effusions are similar compared to prior. There is persistent associated bibasilar atelectasis. Mild pulmonary vascular congestion without overt edema. No pneumothorax. No new focal airspace consolidation. No acute osseous abnormality.  IMPRESSION: 1. Stable moderate  left and small right pleural effusions and associated bibasilar atelectasis. 2. Pulmonary vascular congestion without overt edema.   Electronically Signed   By: Jacqulynn Cadet M.D.   On: 09/06/2013 07:55   Ct Chest Wo Contrast  08/22/2013   CLINICAL DATA:  Left lung cancer status post lobectomy. Weight gain. Renal insufficiency.  EXAM: CT CHEST WITHOUT CONTRAST  TECHNIQUE: Multidetector CT imaging of the chest was performed following the standard protocol without IV contrast.  COMPARISON:  Radiographs 08/13/2013 and 08/04/2013.  CT 08/02/2012.  FINDINGS: There are stable postsurgical changes related to prior left lower lobe resection. There has been interval enlargement of several mediastinal lymph nodes. These include 12 mm right paratracheal (image 19), 13 mm precarinal (image 23), and 14 mm AP window (image 25) lymph nodes. Some of these nodes have retained fatty hila. Allowing for the limitations of noncontrast technique, the hila appear stable.  There is stable low-density within the right thyroid lobe. Atherosclerosis of the aorta, great vessels and coronary arteries is noted. The heart size is normal. There is no pericardial effusion.  Moderate size dependent pleural effusions are present bilaterally. There is associated dependent airspace disease in both lower lobes. In addition, there are more focal airspace opacities within the right lower lobe and inferior aspect of the left upper lobe. The latter is somewhat nodular, measuring up to 1.8 cm on image 43. No other focal nodularity or endobronchial lesions are demonstrated.  The visualized upper abdomen has a stable appearance. There is no adrenal mass. There is increased subcutaneous edema throughout the subcutaneous fat, especially within the anterior aspect of the upper abdomen.  No worrisome osseous findings are demonstrated.  IMPRESSION: 1. As demonstrated radiographically, there are bilateral pleural effusions and bilateral airspace opacities  which are new compared with the prior CT. Although there are focal somewhat nodular components in the right lower and left upper lobes, these findings are most likely infectious/inflammatory. 2. Progressive mediastinal lymphadenopathy. Some of the nodes have retained fatty hila and may be reactive. Metastatic disease cannot be completely excluded. 3. Stable atherosclerosis and low-density within the right thyroid lobe. 4. If the airspace opacities and pleural effusions fail to respond to appropriate clinical therapy, follow-up CT or PET-CT may be warranted to exclude metastatic disease.   Electronically Signed   By: Camie Patience M.D.   On: 08/22/2013 10:26   Dg Chest Portable 1 View  08/13/2013   CLINICAL DATA:  Shortness of breath increasing for 2 weeks. History of CHF.  EXAM: PORTABLE CHEST - 1 VIEW  COMPARISON:  08/04/2013  FINDINGS: Again noted are bibasilar lung densities that are suggestive  for pleural effusions and atelectasis. Heart size appears to be enlarged. There is some peribronchial thickening and cannot exclude pulmonary edema.  IMPRESSION: Bilateral pleural effusions with basilar atelectasis. There is mild pulmonary edema. Minimal change from the previous examination.   Electronically Signed   By: Markus Daft M.D.   On: 08/13/2013 23:10    ASSESSMENT AND PLAN 1. Cardiogenic shock 2. Acute on chronic systolic HF     EF ~87-57% 3. 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 Acute respiratory failure 5. 3-V CAD 6. Carotid stenosis     - doppler 08/31/2013 shows R 60-79% ICA stenosis, L 80-99% ICA stenosis     - vascular surgery aware 7. 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 DM1   She is markedly volume overloaded. Co-ox suggestive of cardiogenic shock. Appears to have reached ESRD. Continue CVHD. Titrate levophed to get co-ox > 55%.   Will eventually need CABG and CEA but will need to be optimized  first. Likely weeks away.   Vascular on board for HD access and carotid stenosis. Appreciate TCTS input and Renal   The patient is critically ill with multiple organ systems failure and requires high complexity decision making for assessment and support, frequent evaluation and titration of therapies, application of advanced monitoring technologies and extensive interpretation of multiple databases.   Critical Care Time devoted to patient care services described in this note is 45 Minutes.   Signed, Jolaine Artist MD 9:58 AM

## 2013-09-07 NOTE — Progress Notes (Signed)
Inpatient Diabetes Program Recommendations  AACE/ADA: New Consensus Statement on Inpatient Glycemic Control (2013)  Target Ranges:  Prepandial:   less than 140 mg/dL      Peak postprandial:   less than 180 mg/dL (1-2 hours)      Critically ill patients:  140 - 180 mg/dL  Results for Hannah [name], Hannah [name] (MRN [id number]) as of 09/07/2013 13:18  Ref. Range 09/06/2013 16:35 09/06/2013 19:24 09/06/2013 21:37 09/07/2013 07:34 09/07/2013 11:25  Glucose-Capillary Latest Range: 70-99 mg/dL 270 (H) 220 (H) 173 (H) 141 (H) 125 (H)   Patient did not receive dose of Levemir last night. Consider decreasing Levemir dose to 10 units. Thank you  Raoul Pitch BSN, RN,CDE Inpatient Diabetes Coordinator [phone] (team pager)

## 2013-09-07 NOTE — Progress Notes (Addendum)
[facility]  Subjective  - Weak and tired on bed side dialysis.   Objective 88/26 104 97.5 F (36.4 C) (Oral) 12 100%  Intake/Output Summary (Last 24 hours) at 09/07/13 8502 Last data filed at 09/07/13 0800  Gross per 24 hour  Intake 820.48 ml  Output   4015 ml  Net -3194.52 ml    Right IJ in place Radial pulses palpable/weak Heart RRR  Assessment/Planning: Heart failure CKD Left ICA > 80% Dr. Trula Slade discussed plan with Dr. Princella Pellegrini that once her CAD is stable and he has talked with [facility] he will proceed with the AV fistula creation first, then likely perform the left carotid endarterectomy.    Hannah [name] 09/07/2013 9:07 AM --  Laboratory Lab Results:  Recent Labs  09/06/13 0740 09/07/13 0338  WBC 6.0 9.0  HGB 9.8* 9.6*  HCT 30.9* 30.8*  PLT 379 335   BMET  Recent Labs  09/06/13 0740 09/07/13 0338  NA 141 138  K 3.4* 4.3  CL 98 100  CO2 28 26  GLUCOSE 53* 165*  BUN 58* 43*  CREATININE 4.28* 3.01*  CALCIUM 8.2* 7.9*    COAG Lab Results  Component Value Date   INR 1.13 09/06/2013   INR 1.09 09/03/2013   INR 0.89 12/04/2012   No results found for this basename: PTT      I agree with the above.  I will discuss with cardiac surgery the timing of the operation, and whether or not to proceed with CABG/carotid and a combined procedure or staged.  Rock Nephew

## 2013-09-07 NOTE — Progress Notes (Signed)
Subjective:  Events noted- seems UOP stopped and she became more SOB- decision was made to move her to CCU and start CRRT- she is so far 2600 down- but 1500 of which is urine- BPs are marginal she is requiring intermittent pressors  Objective Vital signs in last 24 hours: Filed Vitals:   09/07/13 0600 09/07/13 0655 09/07/13 0700 09/07/13 0730  BP: 111/89  81/63 88/64  Pulse:  103 101 107  Temp:   97.5 F (36.4 C)   TempSrc:   Oral   Resp: 16 12 13 14   Height:      Weight:   127.3 kg (280 lb 10.3 oz)   SpO2: 98% 100% 100% 100%   Weight change: -2.202 kg (-4 lb 13.7 oz)  Intake/Output Summary (Last 24 hours) at 09/07/13 8469 Last data filed at 09/07/13 0800  Gross per 24 hour  Intake 1090.08 ml  Output   3995 ml  Net -2904.92 ml    Assessment/ Plan: Pt is a 43 y.o. yo female with advanced CKD at baseline who was admitted on 09/03/2013 with abnormal stress test in the setting of pre op for AVF Assessment/Plan: 1. Renal- advanced CKD at baseline followed by Dr. Marval Regal  due to DM.  Now with abnormal stress test and worsened EF and 3 vessel CAD.  Precautions used for contrast and so far so good.  I agree that she continues to needs diuresis- She is open to starting dialysis sooner rather than later and has now been started on CRRT for volume.  Given low blood pressures this will need to be continued for now.  I appreciate VVS to  place PC and AVF sometime?? 2. Cards- as above, CTS consult done- pt too unstable right now for CABG-  On toprol and zocor 3. Anemia- hgb 9.5- iron stores low, have ordered iron and have added aranesp 4. Secondary hyperparathyroidism- will pth 430- add calcitrioland -phos 6.4- have added phoslo 5. HTN/volume- blood pressure is low.    I agree is volume overloaded now on CRRT -   Louis Meckel    Labs: Basic Metabolic Panel:  Recent Labs Lab 09/05/13 0555 09/06/13 0740 09/07/13 0338  NA 139 141 138  K 3.3* 3.4* 4.3  CL 98 98 100  CO2 27  28 26   GLUCOSE 109* 53* 165*  BUN 62* 58* 43*  CREATININE 4.17* 4.28* 3.01*  CALCIUM 7.7* 8.2* 7.9*  PHOS 6.6*  --  4.8*   Liver Function Tests:  Recent Labs Lab 09/03/13 1352 09/04/13 0400 09/05/13 0555 09/06/13 0740 09/07/13 0338  AST 13 12  --  13  --   ALT 16 15  --  12  --   ALKPHOS 68 64  --  62  --   BILITOT <0.2* <0.2*  --  <0.2*  --   PROT 5.5* 5.3*  --  5.4*  --   ALBUMIN 2.0* 1.9* 1.8* 2.1* 2.1*   No results found for this basename: LIPASE, AMYLASE,  in the last 168 hours No results found for this basename: AMMONIA,  in the last 168 hours CBC:  Recent Labs Lab 09/03/13 1352 09/04/13 0400 09/05/13 0555 09/06/13 0740 09/07/13 0338  WBC 9.4 6.8 6.1 6.0 9.0  HGB 10.6* 9.8* 9.5* 9.8* 9.6*  HCT 33.2* 31.5* 30.1* 30.9* 30.8*  MCV 92.2 92.4 93.8 92.2 93.6  PLT 441* 450* 364 379 335   Cardiac Enzymes:  Recent Labs Lab 09/03/13 1908 09/04/13 0143 09/04/13 0825  TROPONINI 0.45* 0.70* 0.45*  CBG:  Recent Labs Lab 09/06/13 0842 09/06/13 1135 09/06/13 1635 09/06/13 1924 09/06/13 2137  GLUCAP 126* 154* 270* 220* 173*    Iron Studies:   Recent Labs  09/05/13 0555  IRON 44  TIBC 218*  FERRITIN 35   Studies/Results: Dg Chest 2 View  09/06/2013   CLINICAL DATA:  CHF, intermittent dyspnea  EXAM: CHEST  2 VIEW  COMPARISON:  Prior chest x-ray 08/13/2013; prior chest CT 08/22/2013  FINDINGS: Stable cardiac and mediastinal contours. Atherosclerotic calcifications are present within the transverse aorta. Moderate left and small right pleural effusions are similar compared to prior. There is persistent associated bibasilar atelectasis. Mild pulmonary vascular congestion without overt edema. No pneumothorax. No new focal airspace consolidation. No acute osseous abnormality.  IMPRESSION: 1. Stable moderate left and small right pleural effusions and associated bibasilar atelectasis. 2. Pulmonary vascular congestion without overt edema.   Electronically Signed    By: Jacqulynn Cadet M.D.   On: 09/06/2013 07:55   Dg Chest Port 1 View  09/06/2013   CLINICAL DATA:  Right-sided central venous catheter placement.  EXAM: PORTABLE CHEST - 1 VIEW  COMPARISON:  Chest x-ray 09/06/2013.  FINDINGS: Interval removal of previously noted right IJ catheter and replacement with a right IJ Vas-Cath, with tip terminating in the distal superior vena cava. No pneumothorax. Lung volumes are low. Extensive bibasilar opacities (left greater than right) may reflect areas of atelectasis and/or consolidation. There are superimposed small right and small to moderate left pleural effusions. Cephalization of the pulmonary vasculature, with indistinctness of the interstitial markings, suggesting a background of interstitial pulmonary edema. Mild cardiomegaly. Upper mediastinal contours are within normal limits. Atherosclerosis in the thoracic aorta.  IMPRESSION: 1. Tip of new right IJ Vas-Cath is in the distal superior vena cava. No pneumothorax. 2. The appearance of the chest suggests a background of congestive heart failure. Bibasilar opacities may simply reflect atelectasis, with superimposed pleural effusions, however, underlying airspace consolidation from infection or aspiration is not excluded.   Electronically Signed   By: Vinnie Langton M.D.   On: 09/06/2013 21:40   Dg Chest Port 1 View  09/06/2013   CLINICAL DATA:  Central catheter placement  EXAM: PORTABLE CHEST - 1 VIEW  COMPARISON:  Sep 06, 2013  FINDINGS: Central catheter tip is at the cavoatrial junction. No pneumothorax. There are bilateral effusions with bibasilar edema and cardiomegaly. There is mild pulmonary venous hypertension. There is consolidation in the bases, more on the left than on the right.  IMPRESSION: Central catheter tip at cavoatrial junction. No pneumothorax. Congestive heart failure. Cannot exclude superimposed pneumonia in the bases, particularly on the left.   Electronically Signed   By: Lowella Grip  M.D.   On: 09/06/2013 18:34   Medications: Infusions: . sodium chloride 10 mL/hr at 09/06/13 2230  . heparin 10,000 units/ 20 mL infusion syringe 450 Units/hr (09/07/13 0700)  . heparin 1,950 Units/hr (09/07/13 0615)  . norepinephrine (LEVOPHED) Adult infusion 1 mcg/min (09/07/13 0725)  . dialysis replacement fluid (prismasate) 800 mL/hr at 09/07/13 0458  . dialysis replacement fluid (prismasate) 700 mL/hr at 09/07/13 0604  . dialysate (PRISMASATE) 1,500 mL/hr at 09/07/13 0530    Scheduled Medications: . aspirin EC  81 mg Oral Daily  . calcium acetate  667 mg Oral TID WC  . cholecalciferol  1,000 Units Oral Daily  . darbepoetin (ARANESP) injection - NON-DIALYSIS  60 mcg Subcutaneous Q Wed-1800  . fentaNYL  25 mcg Intravenous Once  . fentaNYL  50 mcg  Intravenous Once  . insulin aspart  0-9 Units Subcutaneous TID WC  . insulin detemir  28 Units Subcutaneous QHS  . lidocaine (PF)  5 mL Other Once  . lidocaine (PF)      . midazolam  2 mg Intravenous Once  . midazolam  2 mg Intravenous Once  . potassium chloride  40 mEq Oral Daily  . simvastatin  10 mg Oral q1800  . sodium chloride  3 mL Intravenous Q12H    have reviewed scheduled and prn medications.  Physical Exam: General: groggy, sitting in recliner  Heart: RRR- slightly tachy Lungs:mostly clear Abdomen: obese, soft, non tender Extremities: pitting edema    09/07/2013,8:11 AM  LOS: 4 days

## 2013-09-07 NOTE — Progress Notes (Signed)
ANTICOAGULATION CONSULT NOTE - Follow Up Consult  Pharmacy Consult for Heparin Indication: Awaiting CABG  Allergies  Allergen Reactions  . Crestor [Rosuvastatin] Other (See Comments)    Severe muscle weakness  . Nsaids Other (See Comments)    Not allergic, "bad on my kidneys"  . Ciprofloxacin Rash   Patient Measurements: Height: 5' 7.5" (171.5 cm) Weight: 285 lb 8 oz (129.502 kg) IBW/kg (Calculated) : 62.75 Heparin Dosing Weight: 91 kg  Vital Signs: Temp: 97.6 F (36.4 C) (05/22 0324) Temp src: Oral (05/22 0324) BP: 111/89 mmHg (05/22 0600) Pulse Rate: 107 (05/22 0530)  Labs:  Recent Labs  09/04/13 0825  09/05/13 0555 09/05/13 1625 09/06/13 0740 09/07/13 0338  HGB  --   < > 9.5*  --  9.8* 9.6*  HCT  --   --  30.1*  --  30.9* 30.8*  PLT  --   --  364  --  379 335  LABPROT  --   --   --   --  14.3  --   INR  --   --   --   --  1.13  --   HEPARINUNFRC 0.10*  --  0.15* 0.19* 0.58 0.96*  CREATININE  --   --  4.17*  --  4.28* 3.01*  TROPONINI 0.45*  --   --   --   --   --   < > = values in this interval not displayed.  Estimated Creatinine Clearance: 33.3 ml/min (by C-G formula based on Cr of 3.01).  Assessment: Hep for 3VCAD for CABG: ICM, EF 30-35%. Not yet medically stable for CABG. Current heparin level is supra-therapeutic at 0.96.  I spoke with patient's nurse who reports that this was a peripherally drawn level and that no noted IV complications identified. CBC is low-stable.   Goal of Therapy:  Heparin level 0.3-0.7 units/ml Monitor platelets by anticoagulation protocol: Yes   Plan:  - Will decrease IV heparin to 1950 units/hr.  Recheck heparin level 8 hours after rate change.  Rober Minion, PharmD., MS Clinical Pharmacist Pager:  [phone] Thank you for allowing pharmacy to be part of this patients care team. 09/07/2013,6:19 AM

## 2013-09-07 NOTE — Progress Notes (Signed)
ANTICOAGULATION CONSULT NOTE - Follow Up Consult  Pharmacy Consult for Heparin Indication: Awaiting CABG  Allergies  Allergen Reactions  . Crestor [Rosuvastatin] Other (See Comments)    Severe muscle weakness  . Nsaids Other (See Comments)    Not allergic, "bad on my kidneys"  . Ciprofloxacin Rash   Patient Measurements: Height: 5' 7.5" (171.5 cm) Weight: 280 lb 10.3 oz (127.3 kg) IBW/kg (Calculated) : 62.75 Heparin Dosing Weight: 91 kg  Vital Signs: Temp: 97.9 F (36.6 C) (05/22 1700) Temp src: Oral (05/22 1700) BP: 107/79 mmHg (05/22 1700) Pulse Rate: 110 (05/22 1700)  Labs:  Recent Labs  09/05/13 0555  09/06/13 0740 09/07/13 0338 09/07/13 1600  HGB 9.5*  --  9.8* 9.6*  --   HCT 30.1*  --  30.9* 30.8*  --   PLT 364  --  379 335  --   APTT  --   --   --  >200*  --   LABPROT  --   --  14.3  --   --   INR  --   --  1.13  --   --   HEPARINUNFRC 0.15*  < > 0.58 0.96* 0.57  CREATININE 4.17*  --  4.28* 3.01* 2.46*  < > = values in this interval not displayed.  Estimated Creatinine Clearance: 40.4 ml/min (by C-G formula based on Cr of 2.46).  Assessment: Hep for 3VCAD for CABG: ICM, EF 30-35%. Not yet medically stable for CABG. Current heparin level is therapeutic at 0.57.   Goal of Therapy:  Heparin level 0.3-0.7 units/ml Monitor platelets by anticoagulation protocol: Yes   Plan:  Continue heparin at the same rate. Daily heparin level and CBC  Heide Guile, PharmD, BCPS Clinical Pharmacist Pager [phone]  Thank you for allowing pharmacy to be part of this patients care team. 09/07/2013,5:59 PM

## 2013-09-07 NOTE — Progress Notes (Signed)
CRITICAL VALUE ALERT  Critical value received:  PTT>200  Date of notification:  09/07/2013  Time of notification:  0704  Critical value read back:yes  Nurse who received alert:  Quitman Livings RN  MD notified (1st page):  Expected value  Time of first page:  expected  MD notified (2nd page):  Time of second page:  Responding MD:  expected  Time MD responded:  expected

## 2013-09-08 LAB — RENAL FUNCTION PANEL
ALBUMIN: 2.1 g/dL — AB (ref 3.5–5.2)
Albumin: 2.1 g/dL — ABNORMAL LOW (ref 3.5–5.2)
BUN: 24 mg/dL — AB (ref 6–23)
BUN: 17 mg/dL (ref 6–23)
CALCIUM: 8 mg/dL — AB (ref 8.4–10.5)
CO2: 22 meq/L (ref 19–32)
CO2: 23 mEq/L (ref 19–32)
CREATININE: 2.09 mg/dL — AB (ref 0.50–1.10)
Calcium: 8.1 mg/dL — ABNORMAL LOW (ref 8.4–10.5)
Chloride: 99 mEq/L (ref 96–112)
Chloride: 97 mEq/L (ref 96–112)
Creatinine, Ser: 2.1 mg/dL — ABNORMAL HIGH (ref 0.50–1.10)
GFR calc Af Amer: 32 mL/min — ABNORMAL LOW (ref >90)
GFR calc non Af Amer: 27 mL/min — ABNORMAL LOW (ref >90)
GFR calc non Af Amer: 27 mL/min — ABNORMAL LOW (ref >90)
GFR, EST AFRICAN AMERICAN: 32 mL/min — AB (ref >90)
GLUCOSE: 219 mg/dL — AB (ref 70–99)
GLUCOSE: 253 mg/dL — AB (ref 70–99)
Phosphorus: 3.1 mg/dL (ref 2.3–4.6)
Phosphorus: 2.5 mg/dL (ref 2.3–4.6)
Potassium: 4.2 mEq/L (ref 3.7–5.3)
Potassium: 4 mEq/L (ref 3.7–5.3)
SODIUM: 134 meq/L — AB (ref 137–147)
Sodium: 135 mEq/L — ABNORMAL LOW (ref 137–147)

## 2013-09-08 LAB — CBC
HCT: 31.9 % — ABNORMAL LOW (ref 36.0–46.0)
Hemoglobin: 10.2 g/dL — ABNORMAL LOW (ref 12.0–15.0)
MCH: 29.9 pg (ref 26.0–34.0)
MCHC: 32 g/dL (ref 30.0–36.0)
MCV: 93.5 fL (ref 78.0–100.0)
PLATELETS: 374 10*3/uL (ref 150–400)
RBC: 3.41 MIL/uL — ABNORMAL LOW (ref 3.87–5.11)
RDW: 14 % (ref 11.5–15.5)
WBC: 10.2 10*3/uL (ref 4.0–10.5)

## 2013-09-08 LAB — POCT ACTIVATED CLOTTING TIME
ACTIVATED CLOTTING TIME: 182 s
ACTIVATED CLOTTING TIME: 188 s
ACTIVATED CLOTTING TIME: 188 s
ACTIVATED CLOTTING TIME: 182 s
ACTIVATED CLOTTING TIME: 155 s
ACTIVATED CLOTTING TIME: 149 s
Activated Clotting Time: 182 seconds
Activated Clotting Time: 182 seconds
Activated Clotting Time: 182 seconds
Activated Clotting Time: 182 seconds
Activated Clotting Time: 188 seconds
Activated Clotting Time: 193 seconds
Activated Clotting Time: 182 seconds
Activated Clotting Time: 155 seconds
Activated Clotting Time: 160 seconds
Activated Clotting Time: 160 seconds
Activated Clotting Time: 165 seconds
Activated Clotting Time: 166 seconds
Activated Clotting Time: 182 seconds
Activated Clotting Time: 149 seconds
Activated Clotting Time: 149 seconds
Activated Clotting Time: 166 seconds
Activated Clotting Time: 171 seconds

## 2013-09-08 LAB — CARBOXYHEMOGLOBIN
Carboxyhemoglobin: 2 % — ABNORMAL HIGH (ref 0.5–1.5)
Carboxyhemoglobin: 2.4 % — ABNORMAL HIGH (ref 0.5–1.5)
METHEMOGLOBIN: 1.2 % (ref 0.0–1.5)
Methemoglobin: 0.9 % (ref 0.0–1.5)
O2 Saturation: 56.9 %
O2 Saturation: 64.1 %
TOTAL HEMOGLOBIN: 10.4 g/dL — AB (ref 12.0–16.0)
Total hemoglobin: 9.4 g/dL — ABNORMAL LOW (ref 12.0–16.0)

## 2013-09-08 LAB — GLUCOSE, CAPILLARY
GLUCOSE-CAPILLARY: 186 mg/dL — AB (ref 70–99)
Glucose-Capillary: 195 mg/dL — ABNORMAL HIGH (ref 70–99)
Glucose-Capillary: 248 mg/dL — ABNORMAL HIGH (ref 70–99)
Glucose-Capillary: 295 mg/dL — ABNORMAL HIGH (ref 70–99)
Glucose-Capillary: 311 mg/dL — ABNORMAL HIGH (ref 70–99)

## 2013-09-08 LAB — HEPARIN LEVEL (UNFRACTIONATED): HEPARIN UNFRACTIONATED: 0.67 [IU]/mL (ref 0.30–0.70)

## 2013-09-08 LAB — MAGNESIUM: Magnesium: 2.4 mg/dL (ref 1.5–2.5)

## 2013-09-08 MED ORDER — ATORVASTATIN CALCIUM 80 MG PO TABS
80.0000 mg | ORAL_TABLET | Freq: Every day | ORAL | Status: DC
  Administered 2013-09-08 – 2013-10-29 (×51): 80 mg via ORAL
  Filled 2013-09-08 (×53): qty 1

## 2013-09-08 MED ORDER — SODIUM CHLORIDE 0.9 % IJ SOLN
10.0000 mL | INTRAMUSCULAR | Status: DC | PRN
  Administered 2013-09-10 – 2013-09-20 (×3): 20 mL

## 2013-09-08 MED ORDER — HEPARIN SODIUM (PORCINE) 5000 UNIT/ML IJ SOLN
5000.0000 [IU] | Freq: Three times a day (TID) | INTRAMUSCULAR | Status: DC
  Administered 2013-09-08 – 2013-09-13 (×14): 5000 [IU] via SUBCUTANEOUS
  Filled 2013-09-08 (×18): qty 1

## 2013-09-08 MED ORDER — MILRINONE IN DEXTROSE 20 MG/100ML IV SOLN
0.1250 ug/kg/min | INTRAVENOUS | Status: DC
  Administered 2013-09-08 – 2013-09-10 (×3): 0.125 ug/kg/min via INTRAVENOUS
  Filled 2013-09-08 (×4): qty 100

## 2013-09-08 MED ORDER — INSULIN DETEMIR 100 UNIT/ML ~~LOC~~ SOLN
32.0000 [IU] | Freq: Every day | SUBCUTANEOUS | Status: DC
  Administered 2013-09-08 – 2013-09-12 (×5): 32 [IU] via SUBCUTANEOUS
  Filled 2013-09-08 (×6): qty 0.32

## 2013-09-08 MED ORDER — SODIUM CHLORIDE 0.9 % IJ SOLN
10.0000 mL | Freq: Two times a day (BID) | INTRAMUSCULAR | Status: DC
  Administered 2013-09-08 – 2013-09-09 (×5): 10 mL
  Administered 2013-09-11: 20 mL
  Administered 2013-09-12 – 2013-09-17 (×4): 10 mL
  Administered 2013-09-18: 20 mL
  Administered 2013-09-19 – 2013-09-21 (×6): 10 mL
  Administered 2013-09-22: 20 mL

## 2013-09-08 MED ORDER — METOPROLOL TARTRATE 1 MG/ML IV SOLN
2.5000 mg | Freq: Once | INTRAVENOUS | Status: AC
  Administered 2013-09-08: 2.5 mg via INTRAVENOUS
  Filled 2013-09-08: qty 5

## 2013-09-08 NOTE — Progress Notes (Signed)
Subjective:  Moved to 2S, remains on CRRT- 4 liters off last 24 hours- UOP is dropping off-  BPs are marginal she is requiring intermittent pressors  Objective Vital signs in last 24 hours: Filed Vitals:   09/08/13 0530 09/08/13 0600 09/08/13 0630 09/08/13 0700  BP: 129/68 133/75 133/75 141/67  Pulse: 125 119 123 118  Temp:      TempSrc:      Resp: 17 15 17 16   Height:      Weight:  123.8 kg (272 lb 14.9 oz)    SpO2: 98% 96% 99% 100%   Weight change: -3.5 kg (-7 lb 11.5 oz)  Intake/Output Summary (Last 24 hours) at 09/08/13 0802 Last data filed at 09/08/13 0700  Gross per 24 hour  Intake 1804.33 ml  Output   5458 ml  Net -3653.67 ml    Assessment/ Plan: Pt is a 43 y.o. yo female with advanced CKD at baseline who was admitted on 09/03/2013 with abnormal stress test in the setting of pre op for AVF Assessment/Plan: 1. Renal- advanced CKD at baseline followed by Dr. Marval Regal  due to DM.  Now with abnormal stress test and worsened EF and 3 vessel CAD.  Hospital course complicated by decompensation and need for CRRT. Using heparin- all 4 K dialysate 2. Cards- as above, CTS consult done- pt too unstable right now for CABG-  Previously on toprol stopped due to hypotension- req intermittent pressors- cards to start some milrinone today 3. Anemia- hgb 10.2- iron stores low, have given iron and have added aranesp 4. Secondary hyperparathyroidism- will pth 430- add calcitriol and -phos 6.4- have added phoslo 5. HTN/volume- blood pressure is low.    I agree is volume overloaded now on CRRT - volume removal going relatively well- continue   [facility]    Labs: Basic Metabolic Panel:  Recent Labs Lab 09/07/13 0338 09/07/13 1600 09/08/13 0401  NA 138 135* 135*  K 4.3 4.8 4.2  CL 100 97 99  CO2 26 25 22   GLUCOSE 165* 239* 219*  BUN 43* 29* 24*  CREATININE 3.01* 2.46* 2.09*  CALCIUM 7.9* 8.1* 8.0*  PHOS 4.8* 4.2 3.1   Liver Function Tests:  Recent Labs Lab  09/03/13 1352 09/04/13 0400  09/06/13 0740 09/07/13 0338 09/07/13 1600 09/08/13 0401  AST 13 12  --  13  --   --   --   ALT 16 15  --  12  --   --   --   ALKPHOS 68 64  --  62  --   --   --   BILITOT <0.2* <0.2*  --  <0.2*  --   --   --   PROT 5.5* 5.3*  --  5.4*  --   --   --   ALBUMIN 2.0* 1.9*  < > 2.1* 2.1* 2.3* 2.1*  < > = values in this interval not displayed. No results found for this basename: LIPASE, AMYLASE,  in the last 168 hours No results found for this basename: AMMONIA,  in the last 168 hours CBC:  Recent Labs Lab 09/04/13 0400 09/05/13 0555 09/06/13 0740 09/07/13 0338 09/08/13 0401  WBC 6.8 6.1 6.0 9.0 10.2  HGB 9.8* 9.5* 9.8* 9.6* 10.2*  HCT 31.5* 30.1* 30.9* 30.8* 31.9*  MCV 92.4 93.8 92.2 93.6 93.5  PLT 450* 364 379 335 374   Cardiac Enzymes:  Recent Labs Lab 09/03/13 1908 09/04/13 0143 09/04/13 0825  TROPONINI 0.45* 0.70* 0.45*   CBG:  Recent Labs Lab 09/07/13 0734 09/07/13 1125 09/07/13 1608 09/07/13 1716 09/07/13 2156  GLUCAP 141* 125* 185* 268* 271*    Iron Studies:  No results found for this basename: IRON, TIBC, TRANSFERRIN, FERRITIN,  in the last 72 hours Studies/Results: Dg Chest Port 1 View  09/06/2013   CLINICAL DATA:  Right-sided central venous catheter placement.  EXAM: PORTABLE CHEST - 1 VIEW  COMPARISON:  Chest x-ray 09/06/2013.  FINDINGS: Interval removal of previously noted right IJ catheter and replacement with a right IJ Vas-Cath, with tip terminating in the distal superior vena cava. No pneumothorax. Lung volumes are low. Extensive bibasilar opacities (left greater than right) may reflect areas of atelectasis and/or consolidation. There are superimposed small right and small to moderate left pleural effusions. Cephalization of the pulmonary vasculature, with indistinctness of the interstitial markings, suggesting a background of interstitial pulmonary edema. Mild cardiomegaly. Upper mediastinal contours are within normal  limits. Atherosclerosis in the thoracic aorta.  IMPRESSION: 1. Tip of new right IJ Vas-Cath is in the distal superior vena cava. No pneumothorax. 2. The appearance of the chest suggests a background of congestive heart failure. Bibasilar opacities may simply reflect atelectasis, with superimposed pleural effusions, however, underlying airspace consolidation from infection or aspiration is not excluded.   Electronically Signed   By: Vinnie Langton M.D.   On: 09/06/2013 21:40   Dg Chest Port 1 View  09/06/2013   CLINICAL DATA:  Central catheter placement  EXAM: PORTABLE CHEST - 1 VIEW  COMPARISON:  Sep 06, 2013  FINDINGS: Central catheter tip is at the cavoatrial junction. No pneumothorax. There are bilateral effusions with bibasilar edema and cardiomegaly. There is mild pulmonary venous hypertension. There is consolidation in the bases, more on the left than on the right.  IMPRESSION: Central catheter tip at cavoatrial junction. No pneumothorax. Congestive heart failure. Cannot exclude superimposed pneumonia in the bases, particularly on the left.   Electronically Signed   By: Lowella Grip M.D.   On: 09/06/2013 18:34   Medications: Infusions: . sodium chloride 10 mL/hr at 09/07/13 0800  . heparin 10,000 units/ 20 mL infusion syringe 750 Units/hr (09/08/13 0700)  . heparin 1,950 Units/hr (09/08/13 0700)  . norepinephrine (LEVOPHED) Adult infusion 8 mcg/min (09/08/13 0700)  . dialysis replacement fluid (prismasate) 800 mL/hr at 09/08/13 0520  . dialysis replacement fluid (prismasate) 700 mL/hr at 09/07/13 2100  . dialysate (PRISMASATE) 1,500 mL/hr at 09/08/13 0444    Scheduled Medications: . aspirin EC  81 mg Oral Daily  . calcitRIOL  0.25 mcg Oral Daily  . calcium acetate  667 mg Oral TID WC  . cholecalciferol  1,000 Units Oral Daily  . darbepoetin (ARANESP) injection - NON-DIALYSIS  60 mcg Subcutaneous Q Wed-1800  . fentaNYL  25 mcg Intravenous Once  . fentaNYL  50 mcg Intravenous Once   . insulin aspart  0-9 Units Subcutaneous TID WC  . insulin detemir  28 Units Subcutaneous QHS  . lidocaine (PF)  5 mL Other Once  . midazolam  2 mg Intravenous Once  . midazolam  2 mg Intravenous Once  . simvastatin  10 mg Oral q1800  . sodium chloride  10-40 mL Intracatheter Q12H  . sodium chloride  3 mL Intravenous Q12H    have reviewed scheduled and prn medications.  Physical Exam: General: more alert,  sitting in recliner  Heart: RRR- slightly tachy Lungs:mostly clear Abdomen: obese, soft, non tender Extremities: pitting edema    09/08/2013,8:02 AM  LOS: 5 days

## 2013-09-08 NOTE — Progress Notes (Signed)
    Subjective  -  Patient feels much better today   Physical Exam:  Respirations are nonlabored Neurologically intact Extremities warm and well perfused       Assessment/Plan:    Continue with CVVHD Will discuss with cardiac surgery timing of CABG and possibility combination CABG/carotid endarterectomy  Serafina Mitchell 09/08/2013 10:23 AM --  Danley Danker Vitals:   09/08/13 0826  BP:   Pulse:   Temp: 97.8 F (36.6 C)  Resp:     Intake/Output Summary (Last 24 hours) at 09/08/13 1023 Last data filed at 09/08/13 1000  Gross per 24 hour  Intake 1878.63 ml  Output   5705 ml  Net -3826.37 ml     Laboratory CBC    Component Value Date/Time   WBC 10.2 09/08/2013 0401   HGB 10.2* 09/08/2013 0401   HCT 31.9* 09/08/2013 0401   PLT 374 09/08/2013 0401    BMET    Component Value Date/Time   NA 135* 09/08/2013 0401   K 4.2 09/08/2013 0401   CL 99 09/08/2013 0401   CO2 22 09/08/2013 0401   GLUCOSE 219* 09/08/2013 0401   BUN 24* 09/08/2013 0401   CREATININE 2.09* 09/08/2013 0401   CREATININE 4.17* 07/06/2013 1618   CALCIUM 8.0* 09/08/2013 0401   GFRNONAA 27* 09/08/2013 0401   GFRNONAA 71 07/13/2011 0835   GFRAA 32* 09/08/2013 0401   GFRAA 82 07/13/2011 0835    COAG Lab Results  Component Value Date   INR 1.13 09/06/2013   INR 1.09 09/03/2013   INR 0.89 12/04/2012   No results found for this basename: PTT    Antibiotics Anti-infectives   None       V. Leia Alf, M.D. [facility] Office: [phone] Pager:  [phone]

## 2013-09-08 NOTE — Progress Notes (Signed)
ADVANCED HEART FAILURE ROUNDING NOTE   SUBJECTIVE  43 yo morbidly obese female with PMH of DM1, stage V kidney disease, blindness, h/o lung CA s/p resection who was recently discharged for heart failure symptom had a high risk stress test which showed ischemia in anterior and apical region. She underwent cath 09/04/2013 which showed chronically occluded RCA and triple vessel dx. CT surgery consulted. EF 22% by Myoview. 30-35% by echo  Remains on CVVHD. Weight down 13 pounds. Breathing much better. Able to sleep last night.  U/o 830cc/24 hours. On levophed 46mcg for co-ox 47% -> 57%     CURRENT MEDS . aspirin EC  81 mg Oral Daily  . calcitRIOL  0.25 mcg Oral Daily  . calcium acetate  667 mg Oral TID WC  . cholecalciferol  1,000 Units Oral Daily  . darbepoetin (ARANESP) injection - NON-DIALYSIS  60 mcg Subcutaneous Q Wed-1800  . fentaNYL  25 mcg Intravenous Once  . fentaNYL  50 mcg Intravenous Once  . insulin aspart  0-9 Units Subcutaneous TID WC  . insulin detemir  28 Units Subcutaneous QHS  . lidocaine (PF)  5 mL Other Once  . midazolam  2 mg Intravenous Once  . midazolam  2 mg Intravenous Once  . simvastatin  10 mg Oral q1800  . sodium chloride  10-40 mL Intracatheter Q12H  . sodium chloride  3 mL Intravenous Q12H    OBJECTIVE  Filed Vitals:   09/08/13 0530 09/08/13 0600 09/08/13 0630 09/08/13 0700  BP: 129/68 133/75 133/75 141/67  Pulse: 125 119 123 118  Temp:      TempSrc:      Resp: 17 15 17 16   Height:      Weight:  123.8 kg (272 lb 14.9 oz)    SpO2: 98% 96% 99% 100%    Intake/Output Summary (Last 24 hours) at 09/08/13 0800 Last data filed at 09/08/13 0700  Gross per 24 hour  Intake 1804.33 ml  Output   5458 ml  Net -3653.67 ml   Filed Weights   09/06/13 0500 09/07/13 0700 09/08/13 0600  Weight: 129.502 kg (285 lb 8 oz) 127.3 kg (280 lb 10.3 oz) 123.8 kg (272 lb 14.9 oz)    PHYSICAL EXAM  General: Pleasant, NAD. Sitting up in chair Neuro: Alert and  oriented X 3. Moves all extremities spontaneous HEENT:  Normal  Neck: Supple without bruits. Unable to assess RIJ trialysis catheter Lungs:  clear Heart: tachy regular. no s3, Abdomen: Soft, non-tender, non-distended, BS + x 4.  Extremities: No clubbing and cyanosis. DP/PT/Radials 2+ and equal bilaterally. 2+ pitting edema   CBC  Recent Labs  09/07/13 0338 09/08/13 0401  WBC 9.0 10.2  HGB 9.6* 10.2*  HCT 30.8* 31.9*  MCV 93.6 93.5  PLT 335 469   Basic Metabolic Panel  Recent Labs  09/07/13 0338 09/07/13 1600 09/08/13 0401  NA 138 135* 135*  K 4.3 4.8 4.2  CL 100 97 99  CO2 26 25 22   GLUCOSE 165* 239* 219*  BUN 43* 29* 24*  CREATININE 3.01* 2.46* 2.09*  CALCIUM 7.9* 8.1* 8.0*  MG 2.2  --  2.4  PHOS 4.8* 4.2 3.1   Liver Function Tests  Recent Labs  09/06/13 0740  09/07/13 1600 09/08/13 0401  AST 13  --   --   --   ALT 12  --   --   --   ALKPHOS 62  --   --   --   BILITOT <0.2*  --   --   --  PROT 5.4*  --   --   --   ALBUMIN 2.1*  < > 2.3* 2.1*  < > = values in this interval not displayed. Cardiac Enzymes No results found for this basename: CKTOTAL, CKMB, CKMBINDEX, TROPONINI,  in the last 72 hours Thyroid Function Tests  Recent Labs  09/05/13 1300  TSH 1.240    TELE  Sinus tachycardia since admission, HR high 90s-110s. No significant ventricular ectopy  ECG  Sinus tachycardia with HR 107 and poor R wave progression in anterior leads  Radiology/Studies  Dg Chest 2 View  09/06/2013   CLINICAL DATA:  CHF, intermittent dyspnea  EXAM: CHEST  2 VIEW  COMPARISON:  Prior chest x-ray 08/13/2013; prior chest CT 08/22/2013  FINDINGS: Stable cardiac and mediastinal contours. Atherosclerotic calcifications are present within the transverse aorta. Moderate left and small right pleural effusions are similar compared to prior. There is persistent associated bibasilar atelectasis. Mild pulmonary vascular congestion without overt edema. No pneumothorax. No new  focal airspace consolidation. No acute osseous abnormality.  IMPRESSION: 1. Stable moderate left and small right pleural effusions and associated bibasilar atelectasis. 2. Pulmonary vascular congestion without overt edema.   Electronically Signed   By: Jacqulynn Cadet M.D.   On: 09/06/2013 07:55   Ct Chest Wo Contrast  08/22/2013   CLINICAL DATA:  Left lung cancer status post lobectomy. Weight gain. Renal insufficiency.  EXAM: CT CHEST WITHOUT CONTRAST  TECHNIQUE: Multidetector CT imaging of the chest was performed following the standard protocol without IV contrast.  COMPARISON:  Radiographs 08/13/2013 and 08/04/2013.  CT 08/02/2012.  FINDINGS: There are stable postsurgical changes related to prior left lower lobe resection. There has been interval enlargement of several mediastinal lymph nodes. These include 12 mm right paratracheal (image 19), 13 mm precarinal (image 23), and 14 mm AP window (image 25) lymph nodes. Some of these nodes have retained fatty hila. Allowing for the limitations of noncontrast technique, the hila appear stable.  There is stable low-density within the right thyroid lobe. Atherosclerosis of the aorta, great vessels and coronary arteries is noted. The heart size is normal. There is no pericardial effusion.  Moderate size dependent pleural effusions are present bilaterally. There is associated dependent airspace disease in both lower lobes. In addition, there are more focal airspace opacities within the right lower lobe and inferior aspect of the left upper lobe. The latter is somewhat nodular, measuring up to 1.8 cm on image 43. No other focal nodularity or endobronchial lesions are demonstrated.  The visualized upper abdomen has a stable appearance. There is no adrenal mass. There is increased subcutaneous edema throughout the subcutaneous fat, especially within the anterior aspect of the upper abdomen.  No worrisome osseous findings are demonstrated.  IMPRESSION: 1. As demonstrated  radiographically, there are bilateral pleural effusions and bilateral airspace opacities which are new compared with the prior CT. Although there are focal somewhat nodular components in the right lower and left upper lobes, these findings are most likely infectious/inflammatory. 2. Progressive mediastinal lymphadenopathy. Some of the nodes have retained fatty hila and may be reactive. Metastatic disease cannot be completely excluded. 3. Stable atherosclerosis and low-density within the right thyroid lobe. 4. If the airspace opacities and pleural effusions fail to respond to appropriate clinical therapy, follow-up CT or PET-CT may be warranted to exclude metastatic disease.   Electronically Signed   By: Camie Patience M.D.   On: 08/22/2013 10:26   Dg Chest Portable 1 View  08/13/2013   CLINICAL DATA:  Shortness of breath increasing for 2 weeks. History of CHF.  EXAM: PORTABLE CHEST - 1 VIEW  COMPARISON:  08/04/2013  FINDINGS: Again noted are bibasilar lung densities that are suggestive for pleural effusions and atelectasis. Heart size appears to be enlarged. There is some peribronchial thickening and cannot exclude pulmonary edema.  IMPRESSION: Bilateral pleural effusions with basilar atelectasis. There is mild pulmonary edema. Minimal change from the previous examination.   Electronically Signed   By: Markus Daft M.D.   On: 08/13/2013 23:10    ASSESSMENT AND PLAN 1. Cardiogenic shock 2. Acute on chronic systolic HF     EF ~30-16% 3. 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 Acute respiratory failure 5. 3-V CAD 6. Carotid stenosis     - doppler 08/31/2013 shows R 60-79% ICA stenosis, L 80-99% ICA stenosis     - vascular surgery aware 7. 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 DM1   Volume status improving but still with some to go. Continue CVVHD. She remains on levophed (chose this over milrinone to preserve BP during  CVVHD). Remains tachycardic. Will try to add a little milrinone today and see what happens to BP. Ideally get her over to milrinone alone.   Will eventually need CABG and CEA but will need to be optimized first. Will likely plan RHC once all fluid off.  No b-blocker or ACE due to shock/renal failure.   Continue asa. Change simva to artorva.   Shaune Pascal Germani Gavilanes MD 8:00 AM

## 2013-09-08 NOTE — Progress Notes (Signed)
Dr Haroldine Laws called r/t Pt HR 136/141 consistently, reviewed current drip rates, CRRT progress and vitals signs; Orders received for ECG and further it onto Dr Haroldine Laws; after which orders were received for Lopressor 2.5mg  IV x 1, Pt HR decr. To ST at a rate of 123-125. Will cont' to monitor and assess.

## 2013-09-09 LAB — RENAL FUNCTION PANEL
ALBUMIN: 2.1 g/dL — AB (ref 3.5–5.2)
Albumin: 2 g/dL — ABNORMAL LOW (ref 3.5–5.2)
BUN: 14 mg/dL (ref 6–23)
BUN: 13 mg/dL (ref 6–23)
CALCIUM: 8 mg/dL — AB (ref 8.4–10.5)
CHLORIDE: 98 meq/L (ref 96–112)
CO2: 24 meq/L (ref 19–32)
CO2: 25 mEq/L (ref 19–32)
CREATININE: 1.88 mg/dL — AB (ref 0.50–1.10)
Calcium: 8 mg/dL — ABNORMAL LOW (ref 8.4–10.5)
Chloride: 100 mEq/L (ref 96–112)
Creatinine, Ser: 1.74 mg/dL — ABNORMAL HIGH (ref 0.50–1.10)
GFR calc Af Amer: 36 mL/min — ABNORMAL LOW (ref >90)
GFR calc Af Amer: 40 mL/min — ABNORMAL LOW (ref >90)
GFR calc non Af Amer: 34 mL/min — ABNORMAL LOW (ref >90)
GFR, EST NON AFRICAN AMERICAN: 31 mL/min — AB (ref >90)
Glucose, Bld: 230 mg/dL — ABNORMAL HIGH (ref 70–99)
Glucose, Bld: 252 mg/dL — ABNORMAL HIGH (ref 70–99)
POTASSIUM: 4.4 meq/L (ref 3.7–5.3)
Phosphorus: 2.2 mg/dL — ABNORMAL LOW (ref 2.3–4.6)
Phosphorus: 1.9 mg/dL — ABNORMAL LOW (ref 2.3–4.6)
Potassium: 4.2 mEq/L (ref 3.7–5.3)
Sodium: 136 mEq/L — ABNORMAL LOW (ref 137–147)
Sodium: 133 mEq/L — ABNORMAL LOW (ref 137–147)

## 2013-09-09 LAB — CBC
HCT: 29.4 % — ABNORMAL LOW (ref 36.0–46.0)
Hemoglobin: 9.2 g/dL — ABNORMAL LOW (ref 12.0–15.0)
MCH: 29.7 pg (ref 26.0–34.0)
MCHC: 31.3 g/dL (ref 30.0–36.0)
MCV: 94.8 fL (ref 78.0–100.0)
Platelets: 337 10*3/uL (ref 150–400)
RBC: 3.1 MIL/uL — ABNORMAL LOW (ref 3.87–5.11)
RDW: 14.2 % (ref 11.5–15.5)
WBC: 9.4 10*3/uL (ref 4.0–10.5)

## 2013-09-09 LAB — POCT ACTIVATED CLOTTING TIME
ACTIVATED CLOTTING TIME: 171 s
ACTIVATED CLOTTING TIME: 177 s
ACTIVATED CLOTTING TIME: 177 s
Activated Clotting Time: 160 seconds
Activated Clotting Time: 165 seconds
Activated Clotting Time: 165 seconds
Activated Clotting Time: 171 seconds
Activated Clotting Time: 177 seconds
Activated Clotting Time: 182 seconds
Activated Clotting Time: 182 seconds
Activated Clotting Time: 171 seconds
Activated Clotting Time: 177 seconds
Activated Clotting Time: 188 seconds
Activated Clotting Time: 171 seconds
Activated Clotting Time: 177 seconds
Activated Clotting Time: 177 seconds

## 2013-09-09 LAB — CARBOXYHEMOGLOBIN
Carboxyhemoglobin: 2.3 % — ABNORMAL HIGH (ref 0.5–1.5)
Methemoglobin: 1 % (ref 0.0–1.5)
O2 SAT: 65.6 %
TOTAL HEMOGLOBIN: 9.4 g/dL — AB (ref 12.0–16.0)

## 2013-09-09 LAB — GLUCOSE, CAPILLARY
GLUCOSE-CAPILLARY: 190 mg/dL — AB (ref 70–99)
Glucose-Capillary: 250 mg/dL — ABNORMAL HIGH (ref 70–99)
Glucose-Capillary: 257 mg/dL — ABNORMAL HIGH (ref 70–99)
Glucose-Capillary: 275 mg/dL — ABNORMAL HIGH (ref 70–99)

## 2013-09-09 LAB — APTT: aPTT: 176 seconds — ABNORMAL HIGH (ref 24–37)

## 2013-09-09 LAB — MAGNESIUM: Magnesium: 2.3 mg/dL (ref 1.5–2.5)

## 2013-09-09 MED ORDER — METOPROLOL TARTRATE 1 MG/ML IV SOLN
INTRAVENOUS | Status: AC
  Administered 2013-09-09: 2.5 mg
  Filled 2013-09-09: qty 5

## 2013-09-09 MED ORDER — METOPROLOL SUCCINATE ER 25 MG PO TB24
25.0000 mg | ORAL_TABLET | Freq: Two times a day (BID) | ORAL | Status: DC
  Administered 2013-09-09 – 2013-09-11 (×5): 25 mg via ORAL
  Filled 2013-09-09 (×7): qty 1

## 2013-09-09 MED ORDER — METOPROLOL TARTRATE 1 MG/ML IV SOLN
2.5000 mg | Freq: Once | INTRAVENOUS | Status: AC
  Administered 2013-09-09: 2.5 mg via INTRAVENOUS
  Filled 2013-09-09: qty 5

## 2013-09-09 MED ORDER — METOPROLOL TARTRATE 1 MG/ML IV SOLN
2.5000 mg | Freq: Once | INTRAVENOUS | Status: DC
  Filled 2013-09-09: qty 5

## 2013-09-09 NOTE — Progress Notes (Signed)
Subjective:   Remains on CRRT- 4 liters off last 24 hours- UOP is dropping off-  BPs are marginal she is requiring intermittent pressors- also very tachycardic- got one dose of lopressor  Objective Vital signs in last 24 hours: Filed Vitals:   09/09/13 0630 09/09/13 0645 09/09/13 0700 09/09/13 0745  BP: 111/72 89/47 103/67   Pulse: 137 136 130   Temp:    97.7 F (36.5 C)  TempSrc:    Oral  Resp: 16 16 15    Height:      Weight:   121.5 kg (267 lb 13.7 oz)   SpO2: 97% 99% 98%    Weight change: -2.3 kg (-5 lb 1.1 oz)  Intake/Output Summary (Last 24 hours) at 09/09/13 3016 Last data filed at 09/09/13 0700  Gross per 24 hour  Intake 1322.1 ml  Output   5773 ml  Net -4450.9 ml    Assessment/ Plan: Pt is a 43 y.o. yo female with advanced CKD at baseline who was admitted on 09/03/2013 with abnormal stress test in the setting of pre op for AVF Assessment/Plan: 1. Renal- advanced CKD at baseline followed by Dr. Marval Regal  due to DM.  Now with abnormal stress test and worsened EF and 3 vessel CAD.  Hospital course complicated by decompensation and need for CRRT. Using heparin- all 4 K dialysate 2. Cards- as above, CTS consult done- pt too unstable right now for CABG-  Previously on toprol stopped due to hypotension- req intermittent pressors- cards to start some milrinone today- responded to lopressor- may need combo of lopressor with milrinone and very little norepi 3. Anemia- hgb 9.2- iron stores low, have given iron and have added aranesp 4. Secondary hyperparathyroidism- will pth 430- add calcitriol and -phos 6.4- have added phoslo- now phos is too low, will stop 5. HTN/volume- blood pressure is low.    I agree is volume overloaded now on CRRT - volume removal going relatively well- continue   [employer]    Labs: Basic Metabolic Panel:  Recent Labs Lab 09/08/13 0401 09/08/13 1505 09/09/13 0330  NA 135* 134* 136*  K 4.2 4.0 4.2  CL 99 97 100  CO2 22 23 24   GLUCOSE  219* 253* 230*  BUN 24* 17 14  CREATININE 2.09* 2.10* 1.88*  CALCIUM 8.0* 8.1* 8.0*  PHOS 3.1 2.5 2.2*   Liver Function Tests:  Recent Labs Lab 09/03/13 1352 09/04/13 0400  09/06/13 0740  09/08/13 0401 09/08/13 1505 09/09/13 0330  AST 13 12  --  13  --   --   --   --   ALT 16 15  --  12  --   --   --   --   ALKPHOS 68 64  --  62  --   --   --   --   BILITOT <0.2* <0.2*  --  <0.2*  --   --   --   --   PROT 5.5* 5.3*  --  5.4*  --   --   --   --   ALBUMIN 2.0* 1.9*  < > 2.1*  < > 2.1* 2.1* 2.0*  < > = values in this interval not displayed. No results found for this basename: LIPASE, AMYLASE,  in the last 168 hours No results found for this basename: AMMONIA,  in the last 168 hours CBC:  Recent Labs Lab 09/05/13 0555 09/06/13 0740 09/07/13 0338 09/08/13 0401 09/09/13 0330  WBC 6.1 6.0 9.0 10.2 9.4  HGB 9.5* 9.8* 9.6* 10.2* 9.2*  HCT 30.1* 30.9* 30.8* 31.9* 29.4*  MCV 93.8 92.2 93.6 93.5 94.8  PLT 364 379 335 374 337   Cardiac Enzymes:  Recent Labs Lab 09/03/13 1908 09/04/13 0143 09/04/13 0825  TROPONINI 0.45* 0.70* 0.45*   CBG:  Recent Labs Lab 09/08/13 0816 09/08/13 1157 09/08/13 1658 09/08/13 1809 09/08/13 2121  GLUCAP 186* 195* 248* 295* 311*    Iron Studies:  No results found for this basename: IRON, TIBC, TRANSFERRIN, FERRITIN,  in the last 72 hours Studies/Results: No results found. Medications: Infusions: . sodium chloride 10 mL/hr at 09/07/13 0800  . heparin 10,000 units/ 20 mL infusion syringe 2,500 Units/hr (09/09/13 0700)  . milrinone 0.125 mcg/kg/min (09/09/13 0700)  . norepinephrine (LEVOPHED) Adult infusion 7.5 mcg/min (09/09/13 0700)  . dialysis replacement fluid (prismasate) 800 mL/hr at 09/09/13 0532  . dialysis replacement fluid (prismasate) 700 mL/hr at 09/08/13 2209  . dialysate (PRISMASATE) 1,500 mL/hr at 09/09/13 0644    Scheduled Medications: . aspirin EC  81 mg Oral Daily  . atorvastatin  80 mg Oral q1800  .  calcitRIOL  0.25 mcg Oral Daily  . calcium acetate  667 mg Oral TID WC  . cholecalciferol  1,000 Units Oral Daily  . darbepoetin (ARANESP) injection - NON-DIALYSIS  60 mcg Subcutaneous Q Wed-1800  . fentaNYL  25 mcg Intravenous Once  . fentaNYL  50 mcg Intravenous Once  . heparin  5,000 Units Subcutaneous 3 times per day  . insulin aspart  0-9 Units Subcutaneous TID WC  . insulin detemir  32 Units Subcutaneous QHS  . lidocaine (PF)  5 mL Other Once  . midazolam  2 mg Intravenous Once  . midazolam  2 mg Intravenous Once  . sodium chloride  10-40 mL Intracatheter Q12H  . sodium chloride  3 mL Intravenous Q12H    have reviewed scheduled and prn medications.  Physical Exam: General: more alert,  sitting in recliner  Heart: RRR- slightly tachy Lungs:mostly clear Abdomen: obese, soft, non tender Extremities: pitting edema but improved    09/09/2013,8:06 AM  LOS: 6 days

## 2013-09-09 NOTE — Progress Notes (Signed)
ADVANCED HEART FAILURE ROUNDING NOTE   SUBJECTIVE  43 yo morbidly obese female with PMH of DM1, stage V kidney disease, blindness, h/o lung CA s/p resection who was recently discharged for heart failure symptom had a high risk stress test which showed ischemia in anterior and apical region. She underwent cath 09/04/2013 which showed chronically occluded RCA and triple vessel dx. CT surgery consulted. EF 22% by Myoview. 30-35% by echo  Remains on CVVHD. Weight down 18 pounds. Breathing much better. Able to sleep last night.  U/o 830cc/24 hours. On levophed 7.5 mcg/milrinone 0.125  co-ox 47% -> 57% ->66% Continues with prominent sinus tach in 140s. Got 2.5 lopressor last night and HRs in 120s    CURRENT MEDS . aspirin EC  81 mg Oral Daily  . atorvastatin  80 mg Oral q1800  . calcitRIOL  0.25 mcg Oral Daily  . cholecalciferol  1,000 Units Oral Daily  . darbepoetin (ARANESP) injection - NON-DIALYSIS  60 mcg Subcutaneous Q Wed-1800  . fentaNYL  25 mcg Intravenous Once  . fentaNYL  50 mcg Intravenous Once  . heparin  5,000 Units Subcutaneous 3 times per day  . insulin aspart  0-9 Units Subcutaneous TID WC  . insulin detemir  32 Units Subcutaneous QHS  . lidocaine (PF)  5 mL Other Once  . metoprolol      . midazolam  2 mg Intravenous Once  . midazolam  2 mg Intravenous Once  . sodium chloride  10-40 mL Intracatheter Q12H  . sodium chloride  3 mL Intravenous Q12H    OBJECTIVE  Filed Vitals:   09/09/13 0630 09/09/13 0645 09/09/13 0700 09/09/13 0745  BP: 111/72 89/47 103/67   Pulse: 137 136 130   Temp:    97.7 F (36.5 C)  TempSrc:    Oral  Resp: 16 16 15    Height:      Weight:   121.5 kg (267 lb 13.7 oz)   SpO2: 97% 99% 98%     Intake/Output Summary (Last 24 hours) at 09/09/13 0903 Last data filed at 09/09/13 0800  Gross per 24 hour  Intake 1244.6 ml  Output   5650 ml  Net -4405.4 ml   Filed Weights   09/07/13 0700 09/08/13 0600 09/09/13 0700  Weight: 127.3 kg (280 lb  10.3 oz) 123.8 kg (272 lb 14.9 oz) 121.5 kg (267 lb 13.7 oz)    PHYSICAL EXAM  General: Pleasant, NAD. Sitting up in chair Neuro: Alert and oriented X 3. Moves all extremities spontaneous HEENT:  Normal  Neck: Supple without bruits. Unable to assess RIJ trialysis catheter Lungs:  clear Heart: tachy regular. no s3, Abdomen: Soft, non-tender, non-distended, BS + x 4.  Extremities: No clubbing and cyanosis. DP/PT/Radials 2+ and equal bilaterally. 1+ pitting edema   CBC  Recent Labs  09/08/13 0401 09/09/13 0330  WBC 10.2 9.4  HGB 10.2* 9.2*  HCT 31.9* 29.4*  MCV 93.5 94.8  PLT 374 786   Basic Metabolic Panel  Recent Labs  09/08/13 0401 09/08/13 1505 09/09/13 0330  NA 135* 134* 136*  K 4.2 4.0 4.2  CL 99 97 100  CO2 22 23 24   GLUCOSE 219* 253* 230*  BUN 24* 17 14  CREATININE 2.09* 2.10* 1.88*  CALCIUM 8.0* 8.1* 8.0*  MG 2.4  --  2.3  PHOS 3.1 2.5 2.2*   Liver Function Tests  Recent Labs  09/08/13 1505 09/09/13 0330  ALBUMIN 2.1* 2.0*   Cardiac Enzymes No results found for this basename:  CKTOTAL, CKMB, CKMBINDEX, TROPONINI,  in the last 72 hours Thyroid Function Tests No results found for this basename: TSH, T4TOTAL, FREET3, T3FREE, THYROIDAB,  in the last 72 hours  TELE  Sinus tachycardia since admission, HR high 90s-110s. No significant ventricular ectopy  ECG  Sinus tachycardia with HR 107 and poor R wave progression in anterior leads  Radiology/Studies  Dg Chest 2 View  09/06/2013   CLINICAL DATA:  CHF, intermittent dyspnea  EXAM: CHEST  2 VIEW  COMPARISON:  Prior chest x-ray 08/13/2013; prior chest CT 08/22/2013  FINDINGS: Stable cardiac and mediastinal contours. Atherosclerotic calcifications are present within the transverse aorta. Moderate left and small right pleural effusions are similar compared to prior. There is persistent associated bibasilar atelectasis. Mild pulmonary vascular congestion without overt edema. No pneumothorax. No new focal  airspace consolidation. No acute osseous abnormality.  IMPRESSION: 1. Stable moderate left and small right pleural effusions and associated bibasilar atelectasis. 2. Pulmonary vascular congestion without overt edema.   Electronically Signed   By: Jacqulynn Cadet M.D.   On: 09/06/2013 07:55   Ct Chest Wo Contrast  08/22/2013   CLINICAL DATA:  Left lung cancer status post lobectomy. Weight gain. Renal insufficiency.  EXAM: CT CHEST WITHOUT CONTRAST  TECHNIQUE: Multidetector CT imaging of the chest was performed following the standard protocol without IV contrast.  COMPARISON:  Radiographs 08/13/2013 and 08/04/2013.  CT 08/02/2012.  FINDINGS: There are stable postsurgical changes related to prior left lower lobe resection. There has been interval enlargement of several mediastinal lymph nodes. These include 12 mm right paratracheal (image 19), 13 mm precarinal (image 23), and 14 mm AP window (image 25) lymph nodes. Some of these nodes have retained fatty hila. Allowing for the limitations of noncontrast technique, the hila appear stable.  There is stable low-density within the right thyroid lobe. Atherosclerosis of the aorta, great vessels and coronary arteries is noted. The heart size is normal. There is no pericardial effusion.  Moderate size dependent pleural effusions are present bilaterally. There is associated dependent airspace disease in both lower lobes. In addition, there are more focal airspace opacities within the right lower lobe and inferior aspect of the left upper lobe. The latter is somewhat nodular, measuring up to 1.8 cm on image 43. No other focal nodularity or endobronchial lesions are demonstrated.  The visualized upper abdomen has a stable appearance. There is no adrenal mass. There is increased subcutaneous edema throughout the subcutaneous fat, especially within the anterior aspect of the upper abdomen.  No worrisome osseous findings are demonstrated.  IMPRESSION: 1. As demonstrated  radiographically, there are bilateral pleural effusions and bilateral airspace opacities which are new compared with the prior CT. Although there are focal somewhat nodular components in the right lower and left upper lobes, these findings are most likely infectious/inflammatory. 2. Progressive mediastinal lymphadenopathy. Some of the nodes have retained fatty hila and may be reactive. Metastatic disease cannot be completely excluded. 3. Stable atherosclerosis and low-density within the right thyroid lobe. 4. If the airspace opacities and pleural effusions fail to respond to appropriate clinical therapy, follow-up CT or PET-CT may be warranted to exclude metastatic disease.   Electronically Signed   By: Camie Patience M.D.   On: 08/22/2013 10:26   Dg Chest Portable 1 View  08/13/2013   CLINICAL DATA:  Shortness of breath increasing for 2 weeks. History of CHF.  EXAM: PORTABLE CHEST - 1 VIEW  COMPARISON:  08/04/2013  FINDINGS: Again noted are bibasilar lung densities that  are suggestive for pleural effusions and atelectasis. Heart size appears to be enlarged. There is some peribronchial thickening and cannot exclude pulmonary edema.  IMPRESSION: Bilateral pleural effusions with basilar atelectasis. There is mild pulmonary edema. Minimal change from the previous examination.   Electronically Signed   By: Markus Daft M.D.   On: 08/13/2013 23:10    ASSESSMENT AND PLAN 1. Cardiogenic shock 2. Acute on chronic systolic HF     EF ~78-93% 3. 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 Acute respiratory failure 5. 3-V CAD 6. Carotid stenosis     - doppler 08/31/2013 shows R 60-79% ICA stenosis, L 80-99% ICA stenosis     - vascular surgery aware 7. 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 DM1   Volume status seems to be almost back to baseline. Likely needs CVVHD one more day. Remains very tachycardic. Will try to wean levophed.   Ideally get her over to milrinone alone. Suspect CVVHD may be contributing to tachycardia. Although I am hesistant to b-block sinus tach we will carefully start low dose b-blocekr and see what happens. May need a central line for additional IV access particularly if we cannot wean levophed today.  Once off CVVHD will need RHC +/- Swan to optimize prior to CABG/CEA.  Daniel R Bensimhon,MD 9:08 AM

## 2013-09-10 ENCOUNTER — Inpatient Hospital Stay (HOSPITAL_COMMUNITY): Payer: Medicare Other

## 2013-09-10 DIAGNOSIS — R570 Cardiogenic shock: Secondary | ICD-10-CM

## 2013-09-10 LAB — GLUCOSE, CAPILLARY
Glucose-Capillary: 195 mg/dL — ABNORMAL HIGH (ref 70–99)
Glucose-Capillary: 288 mg/dL — ABNORMAL HIGH (ref 70–99)
Glucose-Capillary: 341 mg/dL — ABNORMAL HIGH (ref 70–99)
Glucose-Capillary: 299 mg/dL — ABNORMAL HIGH (ref 70–99)

## 2013-09-10 LAB — CARBOXYHEMOGLOBIN
CARBOXYHEMOGLOBIN: 2.3 % — AB (ref 0.5–1.5)
Carboxyhemoglobin: 2 % — ABNORMAL HIGH (ref 0.5–1.5)
METHEMOGLOBIN: 0.8 % (ref 0.0–1.5)
Methemoglobin: 1 % (ref 0.0–1.5)
O2 SAT: 46.9 %
O2 Saturation: 53.5 %
Total hemoglobin: 9 g/dL — ABNORMAL LOW (ref 12.0–16.0)
Total hemoglobin: 8.9 g/dL — ABNORMAL LOW (ref 12.0–16.0)

## 2013-09-10 LAB — RENAL FUNCTION PANEL
ALBUMIN: 2.1 g/dL — AB (ref 3.5–5.2)
Albumin: 1.9 g/dL — ABNORMAL LOW (ref 3.5–5.2)
BUN: 14 mg/dL (ref 6–23)
BUN: 15 mg/dL (ref 6–23)
CALCIUM: 7.7 mg/dL — AB (ref 8.4–10.5)
CO2: 25 mEq/L (ref 19–32)
CO2: 25 meq/L (ref 19–32)
CREATININE: 2.06 mg/dL — AB (ref 0.50–1.10)
Calcium: 8 mg/dL — ABNORMAL LOW (ref 8.4–10.5)
Chloride: 100 mEq/L (ref 96–112)
Chloride: 97 mEq/L (ref 96–112)
Creatinine, Ser: 1.78 mg/dL — ABNORMAL HIGH (ref 0.50–1.10)
GFR calc Af Amer: 32 mL/min — ABNORMAL LOW (ref >90)
GFR calc non Af Amer: 33 mL/min — ABNORMAL LOW (ref >90)
GFR calc non Af Amer: 28 mL/min — ABNORMAL LOW (ref >90)
GFR, EST AFRICAN AMERICAN: 39 mL/min — AB (ref >90)
GLUCOSE: 243 mg/dL — AB (ref 70–99)
GLUCOSE: 394 mg/dL — AB (ref 70–99)
POTASSIUM: 4.5 meq/L (ref 3.7–5.3)
Phosphorus: 2.2 mg/dL — ABNORMAL LOW (ref 2.3–4.6)
Phosphorus: 3.3 mg/dL (ref 2.3–4.6)
Potassium: 5.2 mEq/L (ref 3.7–5.3)
SODIUM: 135 meq/L — AB (ref 137–147)
Sodium: 133 mEq/L — ABNORMAL LOW (ref 137–147)

## 2013-09-10 LAB — CBC
HCT: 27 % — ABNORMAL LOW (ref 36.0–46.0)
Hemoglobin: 8.5 g/dL — ABNORMAL LOW (ref 12.0–15.0)
MCH: 29.7 pg (ref 26.0–34.0)
MCHC: 31.5 g/dL (ref 30.0–36.0)
MCV: 94.4 fL (ref 78.0–100.0)
Platelets: 321 10*3/uL (ref 150–400)
RBC: 2.86 MIL/uL — AB (ref 3.87–5.11)
RDW: 14.4 % (ref 11.5–15.5)
WBC: 11.5 10*3/uL — ABNORMAL HIGH (ref 4.0–10.5)

## 2013-09-10 LAB — POCT ACTIVATED CLOTTING TIME
ACTIVATED CLOTTING TIME: 171 s
ACTIVATED CLOTTING TIME: 188 s
Activated Clotting Time: 177 seconds
Activated Clotting Time: 188 seconds
Activated Clotting Time: 127 seconds
Activated Clotting Time: 182 seconds
Activated Clotting Time: 188 seconds
Activated Clotting Time: 193 seconds

## 2013-09-10 LAB — MAGNESIUM: Magnesium: 2.4 mg/dL (ref 1.5–2.5)

## 2013-09-10 LAB — APTT: aPTT: 174 seconds — ABNORMAL HIGH (ref 24–37)

## 2013-09-10 MED ORDER — SODIUM PHOSPHATE 3 MMOLE/ML IV SOLN
20.0000 mmol | Freq: Once | INTRAVENOUS | Status: AC
  Administered 2013-09-10: 20 mmol via INTRAVENOUS
  Filled 2013-09-10: qty 6.67

## 2013-09-10 MED ORDER — MILRINONE IN DEXTROSE 20 MG/100ML IV SOLN
0.2500 ug/kg/min | INTRAVENOUS | Status: DC
  Administered 2013-09-10 – 2013-09-13 (×5): 0.25 ug/kg/min via INTRAVENOUS
  Administered 2013-09-14: 0.125 ug/kg/min via INTRAVENOUS
  Administered 2013-09-15 – 2013-09-19 (×10): 0.25 ug/kg/min via INTRAVENOUS
  Filled 2013-09-10 (×18): qty 100

## 2013-09-10 MED ORDER — DARBEPOETIN ALFA-POLYSORBATE 200 MCG/0.4ML IJ SOLN
200.0000 ug | INTRAMUSCULAR | Status: DC
  Administered 2013-09-12 – 2013-10-24 (×7): 200 ug via SUBCUTANEOUS
  Filled 2013-09-10 (×9): qty 0.4

## 2013-09-10 NOTE — Progress Notes (Signed)
Subjective: Interval History: has no complaint, breathing much better.  Objective: Vital signs in last 24 hours: Temp:  [97.5 F (36.4 C)-98.1 F (36.7 C)] 98.1 F (36.7 C) (05/25 0400) Pulse Rate:  [85-141] 132 (05/25 0700) Resp:  [11-32] 14 (05/25 0700) BP: (68-144)/(34-96) 102/64 mmHg (05/25 0700) SpO2:  [56 %-100 %] 99 % (05/25 0700) Weight:  [118.2 kg (260 lb 9.3 oz)] 118.2 kg (260 lb 9.3 oz) (05/25 0545) Weight change: -3.3 kg (-7 lb 4.4 oz)  Intake/Output from previous day: 05/24 0701 - 05/25 0700 In: 1288.3 [P.O.:880; I.V.:408.3] Out: 4505 [Urine:245] Intake/Output this shift:    General appearance: alert, cooperative, moderately obese and pale Neck: RIJ cath Resp: rales bibasilar Cardio: S1, S2 normal and systolic murmur: holosystolic 2/6, blowing at apex GI: obese, pos bs., liver down 6 cm, soft Extremities: edema 2+  Lab Results:  Recent Labs  09/09/13 0330 09/10/13 0300  WBC 9.4 11.5*  HGB 9.2* 8.5*  HCT 29.4* 27.0*  PLT 337 321   BMET:  Recent Labs  09/09/13 1545 09/10/13 0300  NA 133* 135*  K 4.4 4.5  CL 98 100  CO2 25 25  GLUCOSE 252* 243*  BUN 13 14  CREATININE 1.74* 1.78*  CALCIUM 8.0* 8.0*   No results found for this basename: PTH,  in the last 72 hours Iron Studies: No results found for this basename: IRON, TIBC, TRANSFERRIN, FERRITIN,  in the last 72 hours  Studies/Results: No results found.  I have reviewed the patient's current medications.  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  Needs PC and access. Good solute clearance with CRRT, vol better, not optimal K/acid base ok.  Will lower goal,check CXR, and may transition to IHD soon.  2 anemia epo, ^ 3 HPTH VitD 4 Obesity 5 CAD per Cards 6 Carotid dz for CE 7 Dm variable control 8^ lipids P lower vol off, Check CXR, ^ epo, access, PC    LOS: 7 days   Karleigh Bunte L Sequita Wise 09/10/2013,7:20 AM

## 2013-09-10 NOTE — Progress Notes (Signed)
Inpatient Diabetes Program Recommendations  AACE/ADA: New Consensus Statement on Inpatient Glycemic Control (2013)  Target Ranges:  Prepandial:   less than 140 mg/dL      Peak postprandial:   less than 180 mg/dL (1-2 hours)      Critically ill patients:  140 - 180 mg/dL   Reason for Visit: Hyperglycemia  Diabetes history: DM2 Outpatient Diabetes medications: Humalog 3 units tid, Levemir 34 units QHS Current orders for Inpatient glycemic control: Levemir 32 units QHS, Novolog sensitive tidwc  Inpatient Diabetes Program Recommendations Insulin - Meal Coverage: May want to consider ordering Novolog 3 units TID meal coverage as long as pt eating at least 50% meal.  Note: Will follow. Thank you. Lorenda Peck, RD, LDN, CDE Inpatient Diabetes Coordinator [phone]

## 2013-09-10 NOTE — Progress Notes (Addendum)
ADVANCED HEART FAILURE ROUNDING NOTE   SUBJECTIVE  43 yo morbidly obese female with PMH of DM1, stage V kidney disease, blindness, h/o lung CA s/p resection who was recently discharged for heart failure symptom had a high risk stress test which showed ischemia in anterior and apical region. She underwent cath 09/04/2013 which showed chronically occluded RCA and triple vessel dx. CT surgery consulted. EF 22% by Myoview. 30-35% by echo  Remains on CVVHD. Weight down 25 pounds. Feels much better. Finally able to lid in bed. Breathing much better.  U/o 480 cc/24 hours. Levophed being weaned now down to 2. Co-ox 47% -> 57% ->66%-> 53% Continues with prominent sinus tach in 130-140s despite low dose Toprol    CURRENT MEDS . aspirin EC  81 mg Oral Daily  . atorvastatin  80 mg Oral q1800  . calcitRIOL  0.25 mcg Oral Daily  . [START ON 09/12/2013] darbepoetin (ARANESP) injection - NON-DIALYSIS  200 mcg Subcutaneous Q Wed-1800  . fentaNYL  25 mcg Intravenous Once  . fentaNYL  50 mcg Intravenous Once  . heparin  5,000 Units Subcutaneous 3 times per day  . insulin aspart  0-9 Units Subcutaneous TID WC  . insulin detemir  32 Units Subcutaneous QHS  . lidocaine (PF)  5 mL Other Once  . metoprolol  2.5 mg Intravenous Once  . metoprolol succinate  25 mg Oral BID  . midazolam  2 mg Intravenous Once  . midazolam  2 mg Intravenous Once  . sodium chloride  10-40 mL Intracatheter Q12H  . sodium chloride  3 mL Intravenous Q12H  . sodium phosphate  Dextrose 5% IVPB  20 mmol Intravenous Once    OBJECTIVE  Filed Vitals:   09/10/13 0630 09/10/13 0645 09/10/13 0700 09/10/13 0736  BP: 110/61 102/42 102/64   Pulse: 132 112 132   Temp:    97.9 F (36.6 C)  TempSrc:    Oral  Resp: 15 32 14   Height:      Weight:      SpO2: 100% 97% 99%     Intake/Output Summary (Last 24 hours) at 09/10/13 0739 Last data filed at 09/10/13 0700  Gross per 24 hour  Intake 1288.32 ml  Output   4505 ml  Net -3216.68 ml    Filed Weights   09/08/13 0600 09/09/13 0700 09/10/13 0545  Weight: 123.8 kg (272 lb 14.9 oz) 121.5 kg (267 lb 13.7 oz) 118.2 kg (260 lb 9.3 oz)    PHYSICAL EXAM  General:Lying in bed. NAD Neuro: Alert and oriented X 3. Moves all extremities spontaneous HEENT:  Normal  Neck: Supple without bruits RIJ trialysis catheter Lungs:  clear Heart: tachy regular. + soft s3, Abdomen: Soft, non-tender, non-distended, BS + x 4.  Extremities: No clubbing and cyanosis. DP/PT/Radials 2+ and equal bilaterally. tr pitting edema   CBC  Recent Labs  09/09/13 0330 09/10/13 0300  WBC 9.4 11.5*  HGB 9.2* 8.5*  HCT 29.4* 27.0*  MCV 94.8 94.4  PLT 337 712   Basic Metabolic Panel  Recent Labs  09/09/13 0330 09/09/13 1545 09/10/13 0300  NA 136* 133* 135*  K 4.2 4.4 4.5  CL 100 98 100  CO2 24 25 25   GLUCOSE 230* 252* 243*  BUN 14 13 14   CREATININE 1.88* 1.74* 1.78*  CALCIUM 8.0* 8.0* 8.0*  MG 2.3  --  2.4  PHOS 2.2* 1.9* 2.2*   Liver Function Tests  Recent Labs  09/09/13 1545 09/10/13 0300  ALBUMIN 2.1* 2.1*  Cardiac Enzymes No results found for this basename: CKTOTAL, CKMB, CKMBINDEX, TROPONINI,  in the last 72 hours Thyroid Function Tests No results found for this basename: TSH, T4TOTAL, FREET3, T3FREE, THYROIDAB,  in the last 72 hours  TELE  Sinus tachycardia 130-140 ECG  Sinus tachycardia with HR 107 and poor R wave progression in anterior leads  Radiology/Studies  Dg Chest 2 View  09/06/2013   CLINICAL DATA:  CHF, intermittent dyspnea  EXAM: CHEST  2 VIEW  COMPARISON:  Prior chest x-ray 08/13/2013; prior chest CT 08/22/2013  FINDINGS: Stable cardiac and mediastinal contours. Atherosclerotic calcifications are present within the transverse aorta. Moderate left and small right pleural effusions are similar compared to prior. There is persistent associated bibasilar atelectasis. Mild pulmonary vascular congestion without overt edema. No pneumothorax. No new focal  airspace consolidation. No acute osseous abnormality.  IMPRESSION: 1. Stable moderate left and small right pleural effusions and associated bibasilar atelectasis. 2. Pulmonary vascular congestion without overt edema.   Electronically Signed   By: Jacqulynn Cadet M.D.   On: 09/06/2013 07:55   Ct Chest Wo Contrast  08/22/2013   CLINICAL DATA:  Left lung cancer status post lobectomy. Weight gain. Renal insufficiency.  EXAM: CT CHEST WITHOUT CONTRAST  TECHNIQUE: Multidetector CT imaging of the chest was performed following the standard protocol without IV contrast.  COMPARISON:  Radiographs 08/13/2013 and 08/04/2013.  CT 08/02/2012.  FINDINGS: There are stable postsurgical changes related to prior left lower lobe resection. There has been interval enlargement of several mediastinal lymph nodes. These include 12 mm right paratracheal (image 19), 13 mm precarinal (image 23), and 14 mm AP window (image 25) lymph nodes. Some of these nodes have retained fatty hila. Allowing for the limitations of noncontrast technique, the hila appear stable.  There is stable low-density within the right thyroid lobe. Atherosclerosis of the aorta, great vessels and coronary arteries is noted. The heart size is normal. There is no pericardial effusion.  Moderate size dependent pleural effusions are present bilaterally. There is associated dependent airspace disease in both lower lobes. In addition, there are more focal airspace opacities within the right lower lobe and inferior aspect of the left upper lobe. The latter is somewhat nodular, measuring up to 1.8 cm on image 43. No other focal nodularity or endobronchial lesions are demonstrated.  The visualized upper abdomen has a stable appearance. There is no adrenal mass. There is increased subcutaneous edema throughout the subcutaneous fat, especially within the anterior aspect of the upper abdomen.  No worrisome osseous findings are demonstrated.  IMPRESSION: 1. As demonstrated  radiographically, there are bilateral pleural effusions and bilateral airspace opacities which are new compared with the prior CT. Although there are focal somewhat nodular components in the right lower and left upper lobes, these findings are most likely infectious/inflammatory. 2. Progressive mediastinal lymphadenopathy. Some of the nodes have retained fatty hila and may be reactive. Metastatic disease cannot be completely excluded. 3. Stable atherosclerosis and low-density within the right thyroid lobe. 4. If the airspace opacities and pleural effusions fail to respond to appropriate clinical therapy, follow-up CT or PET-CT may be warranted to exclude metastatic disease.   Electronically Signed   By: Camie Patience M.D.   On: 08/22/2013 10:26   Dg Chest Portable 1 View  08/13/2013   CLINICAL DATA:  Shortness of breath increasing for 2 weeks. History of CHF.  EXAM: PORTABLE CHEST - 1 VIEW  COMPARISON:  08/04/2013  FINDINGS: Again noted are bibasilar lung densities that are  suggestive for pleural effusions and atelectasis. Heart size appears to be enlarged. There is some peribronchial thickening and cannot exclude pulmonary edema.  IMPRESSION: Bilateral pleural effusions with basilar atelectasis. There is mild pulmonary edema. Minimal change from the previous examination.   Electronically Signed   By: Markus Daft M.D.   On: 08/13/2013 23:10    ASSESSMENT AND PLAN 1. Cardiogenic shock 2. Acute on chronic systolic HF     EF ~28-20% 3. 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 Acute respiratory failure 5. 3-V CAD 6. Carotid stenosis     - doppler 08/31/2013 shows R 60-79% ICA stenosis, L 80-99% ICA stenosis     - vascular surgery aware 7. 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 DM1   Doing much better with volume off. Remains quite tachycardic despite wean of levophed. Co-ox falling again. I reviewed tele and ECGs with  Dr. Rayann Heman who agrees this is sinus tach. Will increase milrinone to 0.25. Try to get levophed off. Hopefully pulling back on CVVHD rate will help as well. Check co-ox at 1p. Given severe 3v CAD may need 1u RBC soon. Continue ASA, statin.   Once off CVVHD will need RHC +/- Swan to optimize prior to CABG/CEA. Will give her incentive spirometer. Will also need to mobilize soon. Would be nice to switch to intermittent HD as soon as we can.   Shaune Pascal Wenceslao Loper,MD 7:39 AM

## 2013-09-11 DIAGNOSIS — I2510 Atherosclerotic heart disease of native coronary artery without angina pectoris: Secondary | ICD-10-CM

## 2013-09-11 LAB — RENAL FUNCTION PANEL
Albumin: 2.2 g/dL — ABNORMAL LOW (ref 3.5–5.2)
BUN: 16 mg/dL (ref 6–23)
CALCIUM: 8.1 mg/dL — AB (ref 8.4–10.5)
CHLORIDE: 97 meq/L (ref 96–112)
CO2: 24 meq/L (ref 19–32)
Creatinine, Ser: 1.87 mg/dL — ABNORMAL HIGH (ref 0.50–1.10)
GFR calc non Af Amer: 31 mL/min — ABNORMAL LOW (ref >90)
GFR, EST AFRICAN AMERICAN: 36 mL/min — AB (ref >90)
Glucose, Bld: 356 mg/dL — ABNORMAL HIGH (ref 70–99)
POTASSIUM: 5.3 meq/L (ref 3.7–5.3)
Phosphorus: 2.6 mg/dL (ref 2.3–4.6)
SODIUM: 133 meq/L — AB (ref 137–147)

## 2013-09-11 LAB — CBC
HEMATOCRIT: 24.4 % — AB (ref 36.0–46.0)
HEMOGLOBIN: 7.7 g/dL — AB (ref 12.0–15.0)
MCH: 30 pg (ref 26.0–34.0)
MCHC: 31.6 g/dL (ref 30.0–36.0)
MCV: 94.9 fL (ref 78.0–100.0)
Platelets: 256 10*3/uL (ref 150–400)
RBC: 2.57 MIL/uL — ABNORMAL LOW (ref 3.87–5.11)
RDW: 15.1 % (ref 11.5–15.5)
WBC: 9.9 10*3/uL (ref 4.0–10.5)

## 2013-09-11 LAB — COMPREHENSIVE METABOLIC PANEL
ALK PHOS: 61 U/L (ref 39–117)
ALT: 15 U/L (ref 0–35)
AST: 14 U/L (ref 0–37)
Albumin: 2.1 g/dL — ABNORMAL LOW (ref 3.5–5.2)
BUN: 14 mg/dL (ref 6–23)
CO2: 25 mEq/L (ref 19–32)
Calcium: 7.9 mg/dL — ABNORMAL LOW (ref 8.4–10.5)
Chloride: 101 mEq/L (ref 96–112)
Creatinine, Ser: 1.81 mg/dL — ABNORMAL HIGH (ref 0.50–1.10)
GFR, EST AFRICAN AMERICAN: 38 mL/min — AB (ref >90)
GFR, EST NON AFRICAN AMERICAN: 33 mL/min — AB (ref >90)
GLUCOSE: 221 mg/dL — AB (ref 70–99)
POTASSIUM: 4.5 meq/L (ref 3.7–5.3)
Sodium: 137 mEq/L (ref 137–147)
TOTAL PROTEIN: 5.4 g/dL — AB (ref 6.0–8.3)
Total Bilirubin: 0.3 mg/dL (ref 0.3–1.2)

## 2013-09-11 LAB — CARBOXYHEMOGLOBIN
CARBOXYHEMOGLOBIN: 2.1 % — AB (ref 0.5–1.5)
Methemoglobin: 0.8 % (ref 0.0–1.5)
O2 Saturation: 52.3 %
TOTAL HEMOGLOBIN: 7.4 g/dL — AB (ref 12.0–16.0)

## 2013-09-11 LAB — POCT ACTIVATED CLOTTING TIME
ACTIVATED CLOTTING TIME: 143 s
ACTIVATED CLOTTING TIME: 199 s
ACTIVATED CLOTTING TIME: 226 s
Activated Clotting Time: 160 seconds
Activated Clotting Time: 121 seconds
Activated Clotting Time: 193 seconds

## 2013-09-11 LAB — GLUCOSE, CAPILLARY
Glucose-Capillary: 211 mg/dL — ABNORMAL HIGH (ref 70–99)
Glucose-Capillary: 248 mg/dL — ABNORMAL HIGH (ref 70–99)
Glucose-Capillary: 299 mg/dL — ABNORMAL HIGH (ref 70–99)
Glucose-Capillary: 334 mg/dL — ABNORMAL HIGH (ref 70–99)

## 2013-09-11 LAB — MAGNESIUM: Magnesium: 2.4 mg/dL (ref 1.5–2.5)

## 2013-09-11 LAB — PHOSPHORUS: PHOSPHORUS: 2.2 mg/dL — AB (ref 2.3–4.6)

## 2013-09-11 LAB — APTT: APTT: 163 s — AB (ref 24–37)

## 2013-09-11 MED ORDER — INSULIN DETEMIR 100 UNIT/ML ~~LOC~~ SOLN
10.0000 [IU] | Freq: Every morning | SUBCUTANEOUS | Status: DC
  Administered 2013-09-11 – 2013-09-19 (×9): 10 [IU] via SUBCUTANEOUS
  Filled 2013-09-11 (×10): qty 0.1

## 2013-09-11 MED ORDER — DIGOXIN 0.25 MG/ML IJ SOLN
0.2500 mg | Freq: Once | INTRAMUSCULAR | Status: AC
  Administered 2013-09-11: 0.25 mg via INTRAVENOUS
  Filled 2013-09-11: qty 1

## 2013-09-11 MED ORDER — CEFAZOLIN SODIUM 1-5 GM-% IV SOLN
1.0000 g | INTRAVENOUS | Status: AC
  Administered 2013-09-12: 1 g via INTRAVENOUS
  Filled 2013-09-11: qty 50

## 2013-09-11 MED ORDER — FERUMOXYTOL INJECTION 510 MG/17 ML
510.0000 mg | Freq: Once | INTRAVENOUS | Status: AC
  Administered 2013-09-11: 510 mg via INTRAVENOUS
  Filled 2013-09-11: qty 17

## 2013-09-11 NOTE — Progress Notes (Signed)
Subjective: Interval History: has complaints wants to get up.  Objective: Vital signs in last 24 hours: Temp:  [97.9 F (36.6 C)-98.5 F (36.9 C)] 98.3 F (36.8 C) (05/26 0400) Pulse Rate:  [93-208] 136 (05/26 0700) Resp:  [12-24] 16 (05/26 0700) BP: (73-185)/(28-104) 185/103 mmHg (05/26 0630) SpO2:  [65 %-100 %] 100 % (05/26 0700) Weight:  [117 kg (257 lb 15 oz)] 117 kg (257 lb 15 oz) (05/26 0442) Weight change: -1.2 kg (-2 lb 10.3 oz)  Intake/Output from previous day: 05/25 0701 - 05/26 0700 In: 1900.1 [P.O.:1380; I.V.:520.1] Out: 4028 [Urine:175] Intake/Output this shift:    General appearance: alert, cooperative, moderately obese and pale Neck: RIJ cath Resp: diminished breath sounds bilaterally and rales bibasilar Cardio: S1, S2 normal and ^ HR GI: obese,pos bs, liver down 6 cm, soft Extremities: edema 2+  Lab Results:  Recent Labs  09/10/13 0300 09/11/13 0430  WBC 11.5* 9.9  HGB 8.5* 7.7*  HCT 27.0* 24.4*  PLT 321 256   BMET:  Recent Labs  09/10/13 1547 09/11/13 0430  NA 133* 137  K 5.2 4.5  CL 97 101  CO2 25 25  GLUCOSE 394* 221*  BUN 15 14  CREATININE 2.06* 1.81*  CALCIUM 7.7* 7.9*   No results found for this basename: PTH,  in the last 72 hours Iron Studies: No results found for this basename: IRON, TIBC, TRANSFERRIN, FERRITIN,  in the last 72 hours  Studies/Results: Dg Chest Port 1 View  09/10/2013   CLINICAL DATA:  Congestive heart failure.  EXAM: PORTABLE CHEST - 1 VIEW  COMPARISON:  09/06/2013.  FINDINGS: 0744 hr. Right IJ central venous catheter tip terminates at the lower SVC level. The heart size and mediastinal contours are stable. Pulmonary edema, bibasilar airspace opacities and bilateral pleural effusions are unchanged. There is no evidence of pneumothorax. Telemetry leads overlie the chest.  IMPRESSION: No significant change in edema, bilateral pleural effusions and bibasilar atelectasis.   Electronically Signed   By: Camie Patience M.D.    On: 09/10/2013 09:31    I have reviewed the patient's current medications.  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 Will convert to IHD. Vol xs still but will slowly lower. Suspect 10-20lb yet..  Acid/base/k ok.  Can do without foley.  Needs PC and perm access. Will map. 2 Anemia slowly worse 3 CAD per Cards/CVS 4 Carotild dz per VVS 5 Dm add am Levemir 6 Obesity 7 HPTH Vit D P CRRT to IHD, PC, Epo give Fe, Add levemir, , CABG/CE, Access    LOS: 8 days   Hannah [name] [name] 09/11/2013,7:30 AM

## 2013-09-11 NOTE — Progress Notes (Signed)
"  feel at little dizzy." Feet raised on recliner. Fluid removal rate decreased to 240 from 320. B/P 88/31. Feeling less dizzy with decrease in fluid removal rate and feet raised. Will continue to assess.

## 2013-09-11 NOTE — Progress Notes (Signed)
Restarted levophed at 2 mcg/min  And changed fluid removal rate to keep even.

## 2013-09-11 NOTE — Progress Notes (Addendum)
ADVANCED HEART FAILURE ROUNDING NOTE   SUBJECTIVE  43 yo morbidly obese female with PMH of DM1, stage V kidney disease, blindness, h/o lung CA s/p resection who was recently discharged for heart failure symptom had a high risk stress test which showed ischemia in anterior and apical region. She underwent cath 09/04/2013 which showed chronically occluded RCA and triple vessel dx. CT surgery consulted. EF 22% by Myoview. 30-35% by echo  Transitioning to IHD. Weight down another 3 lbs. Milrinone increased to 0.25 yesterday and co-ox 52%. Remains ST 130s.  24 hr I/O UOP 175. Levophed restarted d/t hypotension last night. Breathing improved.     CURRENT MEDS . aspirin EC  81 mg Oral Daily  . atorvastatin  80 mg Oral q1800  . calcitRIOL  0.25 mcg Oral Daily  . [START ON 09/12/2013] darbepoetin (ARANESP) injection - NON-DIALYSIS  200 mcg Subcutaneous Q Wed-1800  . fentaNYL  25 mcg Intravenous Once  . fentaNYL  50 mcg Intravenous Once  . ferumoxytol  510 mg Intravenous Once  . heparin  5,000 Units Subcutaneous 3 times per day  . insulin aspart  0-9 Units Subcutaneous TID WC  . insulin detemir  10 Units Subcutaneous q morning - 10a  . insulin detemir  32 Units Subcutaneous QHS  . lidocaine (PF)  5 mL Other Once  . metoprolol  2.5 mg Intravenous Once  . metoprolol succinate  25 mg Oral BID  . midazolam  2 mg Intravenous Once  . midazolam  2 mg Intravenous Once  . sodium chloride  10-40 mL Intracatheter Q12H  . sodium chloride  3 mL Intravenous Q12H    OBJECTIVE  Filed Vitals:   09/11/13 0700 09/11/13 0730 09/11/13 0756 09/11/13 0817  BP:  105/71  107/68  Pulse: 136 132  130  Temp:   97.8 F (36.6 C)   TempSrc:   Oral   Resp: 16 20    Height:      Weight:      SpO2: 100% 100%      Intake/Output Summary (Last 24 hours) at 09/11/13 0823 Last data filed at 09/11/13 0800  Gross per 24 hour  Intake 2021.4 ml  Output   4361 ml  Net -2339.6 ml   Filed Weights   09/09/13 0700  09/10/13 0545 09/11/13 0442  Weight: 267 lb 13.7 oz (121.5 kg) 260 lb 9.3 oz (118.2 kg) 257 lb 15 oz (117 kg)    PHYSICAL EXAM  General:Sitting in chair. NAD Neuro: Alert and oriented X 3. Moves all extremities spontaneous HEENT:  Normal  Neck: Supple without bruits RIJ trialysis catheter Lungs:  clear Heart: tachy regular. + soft s3, Abdomen: Soft, non-tender, non-distended, BS + x 4.  Extremities: No clubbing and cyanosis. DP/PT/Radials 2+ and equal bilaterally. tr pitting edema   CBC  Recent Labs  09/10/13 0300 09/11/13 0430  WBC 11.5* 9.9  HGB 8.5* 7.7*  HCT 27.0* 24.4*  MCV 94.4 94.9  PLT 321 175   Basic Metabolic Panel  Recent Labs  09/10/13 0300 09/10/13 1547 09/11/13 0430  NA 135* 133* 137  K 4.5 5.2 4.5  CL 100 97 101  CO2 25 25 25   GLUCOSE 243* 394* 221*  BUN 14 15 14   CREATININE 1.78* 2.06* 1.81*  CALCIUM 8.0* 7.7* 7.9*  MG 2.4  --  2.4  PHOS 2.2* 3.3 2.2*   Liver Function Tests  Recent Labs  09/10/13 1547 09/11/13 0430  AST  --  14  ALT  --  15  ALKPHOS  --  61  BILITOT  --  0.3  PROT  --  5.4*  ALBUMIN 1.9* 2.1*   Cardiac Enzymes No results found for this basename: CKTOTAL, CKMB, CKMBINDEX, TROPONINI,  in the last 72 hours Thyroid Function Tests No results found for this basename: TSH, T4TOTAL, FREET3, T3FREE, THYROIDAB,  in the last 72 hours  TELE  Sinus tachycardia 120-130s ECG  Sinus tachycardia with HR 107 and poor R wave progression in anterior leads  Radiology/Studies  Dg Chest 2 View  09/06/2013   CLINICAL DATA:  CHF, intermittent dyspnea  EXAM: CHEST  2 VIEW  COMPARISON:  Prior chest x-ray 08/13/2013; prior chest CT 08/22/2013  FINDINGS: Stable cardiac and mediastinal contours. Atherosclerotic calcifications are present within the transverse aorta. Moderate left and small right pleural effusions are similar compared to prior. There is persistent associated bibasilar atelectasis. Mild pulmonary vascular congestion without  overt edema. No pneumothorax. No new focal airspace consolidation. No acute osseous abnormality.  IMPRESSION: 1. Stable moderate left and small right pleural effusions and associated bibasilar atelectasis. 2. Pulmonary vascular congestion without overt edema.   Electronically Signed   By: Jacqulynn Cadet M.D.   On: 09/06/2013 07:55   Ct Chest Wo Contrast  08/22/2013   CLINICAL DATA:  Left lung cancer status post lobectomy. Weight gain. Renal insufficiency.  EXAM: CT CHEST WITHOUT CONTRAST  TECHNIQUE: Multidetector CT imaging of the chest was performed following the standard protocol without IV contrast.  COMPARISON:  Radiographs 08/13/2013 and 08/04/2013.  CT 08/02/2012.  FINDINGS: There are stable postsurgical changes related to prior left lower lobe resection. There has been interval enlargement of several mediastinal lymph nodes. These include 12 mm right paratracheal (image 19), 13 mm precarinal (image 23), and 14 mm AP window (image 25) lymph nodes. Some of these nodes have retained fatty hila. Allowing for the limitations of noncontrast technique, the hila appear stable.  There is stable low-density within the right thyroid lobe. Atherosclerosis of the aorta, great vessels and coronary arteries is noted. The heart size is normal. There is no pericardial effusion.  Moderate size dependent pleural effusions are present bilaterally. There is associated dependent airspace disease in both lower lobes. In addition, there are more focal airspace opacities within the right lower lobe and inferior aspect of the left upper lobe. The latter is somewhat nodular, measuring up to 1.8 cm on image 43. No other focal nodularity or endobronchial lesions are demonstrated.  The visualized upper abdomen has a stable appearance. There is no adrenal mass. There is increased subcutaneous edema throughout the subcutaneous fat, especially within the anterior aspect of the upper abdomen.  No worrisome osseous findings are  demonstrated.  IMPRESSION: 1. As demonstrated radiographically, there are bilateral pleural effusions and bilateral airspace opacities which are new compared with the prior CT. Although there are focal somewhat nodular components in the right lower and left upper lobes, these findings are most likely infectious/inflammatory. 2. Progressive mediastinal lymphadenopathy. Some of the nodes have retained fatty hila and may be reactive. Metastatic disease cannot be completely excluded. 3. Stable atherosclerosis and low-density within the right thyroid lobe. 4. If the airspace opacities and pleural effusions fail to respond to appropriate clinical therapy, follow-up CT or PET-CT may be warranted to exclude metastatic disease.   Electronically Signed   By: Camie Patience M.D.   On: 08/22/2013 10:26   Dg Chest Portable 1 View  08/13/2013   CLINICAL DATA:  Shortness of breath increasing for 2  weeks. History of CHF.  EXAM: PORTABLE CHEST - 1 VIEW  COMPARISON:  08/04/2013  FINDINGS: Again noted are bibasilar lung densities that are suggestive for pleural effusions and atelectasis. Heart size appears to be enlarged. There is some peribronchial thickening and cannot exclude pulmonary edema.  IMPRESSION: Bilateral pleural effusions with basilar atelectasis. There is mild pulmonary edema. Minimal change from the previous examination.   Electronically Signed   By: Markus Daft M.D.   On: 08/13/2013 23:10    ASSESSMENT AND PLAN 1. Cardiogenic shock 2. Acute on chronic systolic HF     EF ~80-16% 3. 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 Acute respiratory failure 5. 3-V CAD 6. Carotid stenosis     - doppler 08/31/2013 shows R 60-79% ICA stenosis, L 80-99% ICA stenosis     - vascular surgery aware 7. 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 DM1   Patient remains tenuous and now is back on levophed for hypotension last night after recieving  metoprolol. Remains ST 130s. HR has not improved any with BB so will discontinue. Co-ox remains low 52% on milrinone 0.25. Will likely need swan to assess hemodynamics.   Volume status being managed by CVVHD and will transition to IHD. Going to OR tomorrow for HD catheter placement.  3V CAD continue statin and ASA. Will need to optimize before to CABG/CEA.  Rande Brunt, NP-C 8:23 AM   Patient seen and examined with Junie Bame, NP. We discussed all aspects of the encounter. I agree with the assessment and plan as stated above. She remains very tenuous. Not sure why she is so tachycardic. I reviewed strips again and confirmed sinus tach. Will stop b-blocker. Can consider digoxin but would have to be very careful with dosing in setting of ESRD. Continue milrinone and levophed as needed. For [facility] cath tomorrow.   Shaune Pascal Randy Castrejon,MD 12:57 PM

## 2013-09-11 NOTE — Progress Notes (Addendum)
       Plan :  Dr. Kellie Simmering to place permanent Diatek catheter on the right side.  She will need Left carotid surgery in the future.    Ulyses Amor PA-C   Pt currently with temp right IJ catheter.  She will need left CEA soon so we will try to keep catheter on right side.  No plans for permanent access until recovered from CABG by Dr Prescott Gum and CEA by Dr Trula Slade.  Ruta Hinds, MD [facility]: [phone] Pager: [phone]

## 2013-09-11 NOTE — Plan of Care (Signed)
Problem: Phase I Progression Outcomes Goal: Pt./family verbalizes plan of care Outcome: Completed/Met Date Met:  09/11/13 CRRT  Problem: Phase II Progression Outcomes Goal: Dialysis initiated Outcome: Progressing CRRT. Going for diatek placement in am. Currently pulling off 100 ml/hr on CRRT. On and off norepinephrine

## 2013-09-11 NOTE — Progress Notes (Addendum)
7 Days Post-Op Procedure(s) (LRB): LEFT HEART CATHETERIZATION WITH CORONARY ANGIOGRAM (N/A) Subjective: Type I diabetic with severe coronary disease, LV dysfunction, peripheral vascular disease and acute on chronic renal failure starting hemodialysis Continuous dialysis is removing significant fluid-weight down from 280-250, her baseline is approximately 220 pounds She remains tachycardic with a hemoglobin of 7.7-recommend blood transfusion The plan is for multivessel CABG once her fluid is managed with dialysis and her ventricular  function is adequate as assessed by Swan catheter and her hemoglobin A1c is less than 10. She will then have a later left carotid endarterectomy for greater than 80% stenosis, asymptomatic  Objective: Vital signs in last 24 hours: Temp:  [97.8 F (36.6 C)-98.5 F (36.9 C)] 98.3 F (36.8 C) (05/26 1200) Pulse Rate:  [93-208] 124 (05/26 1245) Cardiac Rhythm:  [-] Sinus tachycardia (05/26 1200) Resp:  [12-24] 20 (05/26 1245) BP: (51-185)/(17-104) 121/73 mmHg (05/26 1245) SpO2:  [65 %-100 %] 98 % (05/26 1245) Weight:  [257 lb 15 oz (117 kg)] 257 lb 15 oz (117 kg) (05/26 0442)  Hemodynamic parameters for last 24 hours:   afebrile sinus tachycardia  Intake/Output from previous day: 05/25 0701 - 05/26 0700 In: 2019 [P.O.:1480; I.V.:539] Out: 4235 [Urine:175] Intake/Output this shift: Total I/O In: 417.8 [P.O.:200; I.V.:100.8; IV Piggyback:117] Out: 831 [Urine:25; Other:806]  Patient comfortable resting in chair Lungs remain quite edematous  Lab Results:  Recent Labs  09/10/13 0300 09/11/13 0430  WBC 11.5* 9.9  HGB 8.5* 7.7*  HCT 27.0* 24.4*  PLT 321 256   BMET:  Recent Labs  09/10/13 1547 09/11/13 0430  NA 133* 137  K 5.2 4.5  CL 97 101  CO2 25 25  GLUCOSE 394* 221*  BUN 15 14  CREATININE 2.06* 1.81*  CALCIUM 7.7* 7.9*    PT/INR: No results found for this basename: LABPROT, INR,  in the last 72 hours ABG    Component Value  Date/Time   PHART 7.333* 07/31/2011 0422   HCO3 22.0 07/31/2011 0422   TCO2 23 07/31/2011 0422   ACIDBASEDEF 4.0* 07/31/2011 0422   O2SAT 52.3 09/11/2013 0440   CBG (last 3)   Recent Labs  09/10/13 2125 09/11/13 0754 09/11/13 1204  GLUCAP 299* 211* 248*    Assessment/Plan: S/P Procedure(s) (LRB): LEFT HEART CATHETERIZATION WITH CORONARY ANGIOGRAM (N/A) Patient not currently candidate for CABG because the renal status, heart failure, and inadequate pulmonary function with FEV1 900 cc-she will need PFTs repeated Will follow     LOS: 8 days    Tharon Aquas Trigt 09/11/2013

## 2013-09-11 NOTE — Plan of Care (Signed)
Problem: Phase I Progression Outcomes Goal: Dyspnea controlled at rest (HF) Outcome: Progressing Using less norepinephrine. Metoprolol d/ced today and was given IV digoxin x 1 dose.

## 2013-09-12 ENCOUNTER — Inpatient Hospital Stay (HOSPITAL_COMMUNITY): Payer: Medicare Other

## 2013-09-12 ENCOUNTER — Encounter (HOSPITAL_COMMUNITY)
Admission: EM | Disposition: A | Discharge status: Home Health | Payer: Self-pay | Source: Home / Self Care | Attending: Interventional Cardiology

## 2013-09-12 ENCOUNTER — Encounter (HOSPITAL_COMMUNITY): Payer: Medicare Other | Admitting: Anesthesiology

## 2013-09-12 ENCOUNTER — Inpatient Hospital Stay (HOSPITAL_COMMUNITY): Payer: Medicare Other | Admitting: Anesthesiology

## 2013-09-12 DIAGNOSIS — Z01810 Encounter for preprocedural cardiovascular examination: Secondary | ICD-10-CM

## 2013-09-12 DIAGNOSIS — N185 Chronic kidney disease, stage 5: Secondary | ICD-10-CM

## 2013-09-12 HISTORY — PX: INSERTION OF DIALYSIS CATHETER: SHX1324

## 2013-09-12 LAB — COMPREHENSIVE METABOLIC PANEL
ALK PHOS: 72 U/L (ref 39–117)
ALT: 17 U/L (ref 0–35)
AST: 19 U/L (ref 0–37)
Albumin: 2.2 g/dL — ABNORMAL LOW (ref 3.5–5.2)
BUN: 16 mg/dL (ref 6–23)
CO2: 23 meq/L (ref 19–32)
Calcium: 8.1 mg/dL — ABNORMAL LOW (ref 8.4–10.5)
Chloride: 99 mEq/L (ref 96–112)
Creatinine, Ser: 1.87 mg/dL — ABNORMAL HIGH (ref 0.50–1.10)
GFR, EST AFRICAN AMERICAN: 36 mL/min — AB (ref >90)
GFR, EST NON AFRICAN AMERICAN: 31 mL/min — AB (ref >90)
GLUCOSE: 302 mg/dL — AB (ref 70–99)
POTASSIUM: 4.4 meq/L (ref 3.7–5.3)
Sodium: 135 mEq/L — ABNORMAL LOW (ref 137–147)
Total Bilirubin: 0.3 mg/dL (ref 0.3–1.2)
Total Protein: 5.6 g/dL — ABNORMAL LOW (ref 6.0–8.3)

## 2013-09-12 LAB — RENAL FUNCTION PANEL
Albumin: 2.1 g/dL — ABNORMAL LOW (ref 3.5–5.2)
BUN: 19 mg/dL (ref 6–23)
CO2: 23 meq/L (ref 19–32)
Calcium: 8 mg/dL — ABNORMAL LOW (ref 8.4–10.5)
Chloride: 96 meq/L (ref 96–112)
Creatinine, Ser: 2.58 mg/dL — ABNORMAL HIGH (ref 0.50–1.10)
GFR calc Af Amer: 25 mL/min — ABNORMAL LOW
GFR calc non Af Amer: 21 mL/min — ABNORMAL LOW
Glucose, Bld: 271 mg/dL — ABNORMAL HIGH (ref 70–99)
Phosphorus: 3.4 mg/dL (ref 2.3–4.6)
Potassium: 4.7 meq/L (ref 3.7–5.3)
Sodium: 132 meq/L — ABNORMAL LOW (ref 137–147)

## 2013-09-12 LAB — CBC
HCT: 25.6 % — ABNORMAL LOW (ref 36.0–46.0)
Hemoglobin: 8.1 g/dL — ABNORMAL LOW (ref 12.0–15.0)
MCH: 30.1 pg (ref 26.0–34.0)
MCHC: 31.6 g/dL (ref 30.0–36.0)
MCV: 95.2 fL (ref 78.0–100.0)
Platelets: 280 10*3/uL (ref 150–400)
RBC: 2.69 MIL/uL — ABNORMAL LOW (ref 3.87–5.11)
RDW: 15.5 % (ref 11.5–15.5)
WBC: 9.2 10*3/uL (ref 4.0–10.5)

## 2013-09-12 LAB — CARBOXYHEMOGLOBIN
Carboxyhemoglobin: 2.3 % — ABNORMAL HIGH (ref 0.5–1.5)
Methemoglobin: 1.1 % (ref 0.0–1.5)
O2 Saturation: 62.1 %
Total hemoglobin: 8.5 g/dL — ABNORMAL LOW (ref 12.0–16.0)

## 2013-09-12 LAB — GLUCOSE, CAPILLARY
GLUCOSE-CAPILLARY: 229 mg/dL — AB (ref 70–99)
Glucose-Capillary: 253 mg/dL — ABNORMAL HIGH (ref 70–99)
Glucose-Capillary: 217 mg/dL — ABNORMAL HIGH (ref 70–99)
Glucose-Capillary: 179 mg/dL — ABNORMAL HIGH (ref 70–99)
Glucose-Capillary: 282 mg/dL — ABNORMAL HIGH (ref 70–99)

## 2013-09-12 LAB — POCT ACTIVATED CLOTTING TIME
ACTIVATED CLOTTING TIME: 193 s
Activated Clotting Time: 199 s
Activated Clotting Time: 199 s

## 2013-09-12 LAB — HEPATITIS B CORE ANTIBODY, IGM: Hep B C IgM: NONREACTIVE

## 2013-09-12 LAB — HEPATITIS B SURFACE ANTIGEN: Hepatitis B Surface Ag: NEGATIVE

## 2013-09-12 LAB — PHOSPHORUS: Phosphorus: 2.6 mg/dL (ref 2.3–4.6)

## 2013-09-12 LAB — APTT: aPTT: >200 seconds (ref 24–37)

## 2013-09-12 LAB — MAGNESIUM: Magnesium: 2.4 mg/dL (ref 1.5–2.5)

## 2013-09-12 SURGERY — INSERTION OF DIALYSIS CATHETER
Anesthesia: Monitor Anesthesia Care | Site: Neck | Laterality: Left

## 2013-09-12 MED ORDER — LIDOCAINE HCL (PF) 1 % IJ SOLN
INTRAMUSCULAR | Status: DC | PRN
  Administered 2013-09-12: 14 mL via INTRADERMAL

## 2013-09-12 MED ORDER — PROMETHAZINE HCL 25 MG/ML IJ SOLN: 6.2500 mg | INTRAMUSCULAR | Status: DC | PRN

## 2013-09-12 MED ORDER — FENTANYL CITRATE 0.05 MG/ML IJ SOLN
INTRAMUSCULAR | Status: AC
  Filled 2013-09-12: qty 5

## 2013-09-12 MED ORDER — OXYCODONE HCL 5 MG PO TABS: 5.0000 mg | ORAL_TABLET | Freq: Once | ORAL | Status: DC | PRN

## 2013-09-12 MED ORDER — SODIUM CHLORIDE 0.9 % IV SOLN
INTRAVENOUS | Status: DC | PRN
  Administered 2013-09-12: 11:00:00 via INTRAVENOUS

## 2013-09-12 MED ORDER — METOPROLOL TARTRATE 1 MG/ML IV SOLN
2.5000 mg | INTRAVENOUS | Status: AC
  Administered 2013-09-12: 2.5 mg via INTRAVENOUS

## 2013-09-12 MED ORDER — MIDAZOLAM HCL 5 MG/5ML IJ SOLN
INTRAMUSCULAR | Status: DC | PRN
  Administered 2013-09-12: 2 mg via INTRAVENOUS

## 2013-09-12 MED ORDER — HEPARIN SODIUM (PORCINE) 1000 UNIT/ML IJ SOLN
INTRAMUSCULAR | Status: AC
  Filled 2013-09-12: qty 1

## 2013-09-12 MED ORDER — HYDROMORPHONE HCL PF 1 MG/ML IJ SOLN: 0.2500 mg | INTRAMUSCULAR | Status: DC | PRN

## 2013-09-12 MED ORDER — FENTANYL CITRATE 0.05 MG/ML IJ SOLN
INTRAMUSCULAR | Status: DC | PRN
  Administered 2013-09-12: 50 ug via INTRAVENOUS

## 2013-09-12 MED ORDER — METOPROLOL TARTRATE 1 MG/ML IV SOLN
INTRAVENOUS | Status: AC
  Filled 2013-09-12: qty 5

## 2013-09-12 MED ORDER — PROPOFOL INFUSION 10 MG/ML OPTIME
INTRAVENOUS | Status: DC | PRN
  Administered 2013-09-12: 100 ug/kg/min via INTRAVENOUS

## 2013-09-12 MED ORDER — METOPROLOL TARTRATE 1 MG/ML IV SOLN
2.5000 mg | Freq: Once | INTRAVENOUS | Status: AC
  Administered 2013-09-12: 2.5 mg via INTRAVENOUS

## 2013-09-12 MED ORDER — INSULIN ASPART 100 UNIT/ML ~~LOC~~ SOLN
SUBCUTANEOUS | Status: AC
  Filled 2013-09-12: qty 3

## 2013-09-12 MED ORDER — SODIUM CHLORIDE 0.9 % IR SOLN
Status: DC | PRN
  Administered 2013-09-12: 11:00:00

## 2013-09-12 MED ORDER — MEPERIDINE HCL 25 MG/ML IJ SOLN
6.2500 mg | INTRAMUSCULAR | Status: DC | PRN
Start: 2013-09-12 — End: 2013-09-12

## 2013-09-12 MED ORDER — HEPARIN SODIUM (PORCINE) 1000 UNIT/ML IJ SOLN
INTRAMUSCULAR | Status: DC | PRN
  Administered 2013-09-12: 10000 [IU] via INTRAVENOUS

## 2013-09-12 MED ORDER — MIDAZOLAM HCL 2 MG/2ML IJ SOLN
INTRAMUSCULAR | Status: AC
  Filled 2013-09-12: qty 2

## 2013-09-12 MED ORDER — OXYCODONE HCL 5 MG/5ML PO SOLN: 5.0000 mg | Freq: Once | ORAL | Status: DC | PRN

## 2013-09-12 MED ORDER — LIDOCAINE HCL (PF) 1 % IJ SOLN
INTRAMUSCULAR | Status: AC
  Filled 2013-09-12: qty 30

## 2013-09-12 MED ORDER — 0.9 % SODIUM CHLORIDE (POUR BTL) OPTIME
TOPICAL | Status: DC | PRN
  Administered 2013-09-12: 1000 mL

## 2013-09-12 SURGICAL SUPPLY — 46 items
BAG DECANTER FOR FLEXI CONT (MISCELLANEOUS) ×3 IMPLANT
BLADE 10 SAFETY STRL DISP (BLADE) IMPLANT
CATH CANNON HEMO 15F 50CM (CATHETERS) IMPLANT
CATH CANNON HEMO 15FR 19 (HEMODIALYSIS SUPPLIES) ×3 IMPLANT
CATH CANNON HEMO 15FR 23CM (HEMODIALYSIS SUPPLIES) ×3 IMPLANT
CATH CANNON HEMO 15FR 31CM (HEMODIALYSIS SUPPLIES) IMPLANT
CATH CANNON HEMO 15FR 32CM (HEMODIALYSIS SUPPLIES) IMPLANT
COVER PROBE W GEL 5X96 (DRAPES) IMPLANT
COVER SURGICAL LIGHT HANDLE (MISCELLANEOUS) ×3 IMPLANT
DECANTER SPIKE VIAL GLASS SM (MISCELLANEOUS) IMPLANT
DERMABOND ADVANCED (GAUZE/BANDAGES/DRESSINGS) ×2
DERMABOND ADVANCED .7 DNX12 (GAUZE/BANDAGES/DRESSINGS) ×1 IMPLANT
DRAPE C-ARM 42X72 X-RAY (DRAPES) ×3 IMPLANT
DRAPE CHEST BREAST 15X10 FENES (DRAPES) ×3 IMPLANT
GAUZE SPONGE 2X2 8PLY STRL LF (GAUZE/BANDAGES/DRESSINGS) ×1 IMPLANT
GAUZE SPONGE 4X4 16PLY XRAY LF (GAUZE/BANDAGES/DRESSINGS) ×3 IMPLANT
GLOVE BIO SURGEON STRL SZ 6.5 (GLOVE) ×2 IMPLANT
GLOVE BIO SURGEONS STRL SZ 6.5 (GLOVE) ×1
GLOVE BIOGEL PI IND STRL 6.5 (GLOVE) ×1 IMPLANT
GLOVE BIOGEL PI IND STRL 7.0 (GLOVE) ×1 IMPLANT
GLOVE BIOGEL PI INDICATOR 6.5 (GLOVE) ×2
GLOVE BIOGEL PI INDICATOR 7.0 (GLOVE) ×2
GLOVE SS BIOGEL STRL SZ 7 (GLOVE) ×1 IMPLANT
GLOVE SUPERSENSE BIOGEL SZ 7 (GLOVE) ×2
GOWN STRL REUS W/ TWL LRG LVL3 (GOWN DISPOSABLE) ×2 IMPLANT
GOWN STRL REUS W/TWL LRG LVL3 (GOWN DISPOSABLE) ×4
KIT BASIN OR (CUSTOM PROCEDURE TRAY) ×3 IMPLANT
KIT ROOM TURNOVER OR (KITS) ×3 IMPLANT
NEEDLE 18GX1X1/2 (RX/OR ONLY) (NEEDLE) IMPLANT
NEEDLE 22X1 1/2 (OR ONLY) (NEEDLE) ×3 IMPLANT
NEEDLE HYPO 25GX1X1/2 BEV (NEEDLE) IMPLANT
NS IRRIG 1000ML POUR BTL (IV SOLUTION) ×3 IMPLANT
PACK SURGICAL SETUP 50X90 (CUSTOM PROCEDURE TRAY) ×3 IMPLANT
PAD ARMBOARD 7.5X6 YLW CONV (MISCELLANEOUS) ×6 IMPLANT
SOAP 2 % CHG 4 OZ (WOUND CARE) ×3 IMPLANT
SPONGE GAUZE 2X2 STER 10/PKG (GAUZE/BANDAGES/DRESSINGS) ×2
SUT ETHILON 3 0 PS 1 (SUTURE) ×3 IMPLANT
SUT VICRYL 4-0 PS2 18IN ABS (SUTURE) ×3 IMPLANT
SYR 20CC LL (SYRINGE) ×3 IMPLANT
SYR 5ML LL (SYRINGE) ×6 IMPLANT
SYR CONTROL 10ML LL (SYRINGE) ×3 IMPLANT
SYRINGE 10CC LL (SYRINGE) ×3 IMPLANT
TAPE CLOTH SURG 4X10 WHT LF (GAUZE/BANDAGES/DRESSINGS) ×3 IMPLANT
TOWEL OR 17X24 6PK STRL BLUE (TOWEL DISPOSABLE) ×3 IMPLANT
TOWEL OR 17X26 10 PK STRL BLUE (TOWEL DISPOSABLE) ×3 IMPLANT
WATER STERILE IRR 1000ML POUR (IV SOLUTION) ×3 IMPLANT

## 2013-09-12 NOTE — Transfer of Care (Signed)
Immediate Anesthesia Transfer of Care Note  Patient: Hannah [name]  Procedure(s) Performed: Procedure(s): INSERTION OF DIALYSIS CATHETER (Left)  Patient Location: PACU  Anesthesia Type:MAC  Level of Consciousness: awake, alert  and oriented  Airway & Oxygen Therapy: Patient Spontanous Breathing  Post-op Assessment: Report given to PACU RN  Post vital signs: Reviewed and stable  Complications: No apparent anesthesia complications

## 2013-09-12 NOTE — Op Note (Signed)
OPERATIVE REPORT  Date of Surgery: 09/03/2013 - 09/12/2013  Surgeon: Tinnie Gens, MD  Assistant: Nurse Pre-op Diagnosis: End stage renal disease   Post-op Diagnosis: End stage renal disease   Procedure: Procedure(s): Ultrasound localization left IJ-SonoSite and insertion 23 cm tunnel hemodialysis catheter-Diateck  Anesthesia: MAC  EBL: Minimal  Complications: None  Procedure Details: Patient taken to the operating room placed in the Trendelenburg position. She had a 3 port central venous catheter in the right IJ and pressors were being dripped in from that site therefore there was no option other than to insert the catheter on the left side. Left IJ was then imaged using B-mode ultrasound noted to be widely patent. After infiltration of 1% Xylocaine the left IJ was entered using a supraclavicular approach guidewire passed into the right atrium under fluoroscopic guidance. A 23 cm tunneled hemodialysis catheter was passed through peel-away sheath positioned in the right atrium tunneled peripherally and laterally secured with silk suture and the wound closed with Vicryl in subcuticular fashion with Dermabond patient taken to recovery in stable condition   Tinnie Gens, MD 09/12/2013 12:21 PM

## 2013-09-12 NOTE — Progress Notes (Signed)
Subjective: Interval History: has no complaint, tired..  Objective: Vital signs in last 24 hours: Temp:  [97.4 F (36.3 C)-98.4 F (36.9 C)] 98.4 F (36.9 C) (05/27 0735) Pulse Rate:  [103-135] 132 (05/27 0600) Resp:  [11-31] 18 (05/27 0600) BP: (51-160)/(17-124) 104/39 mmHg (05/27 0600) SpO2:  [83 %-100 %] 98 % (05/27 0600) Weight:  [116.3 kg (256 lb 6.3 oz)] 116.3 kg (256 lb 6.3 oz) (05/27 0600) Weight change: -0.7 kg (-1 lb 8.7 oz)  Intake/Output from previous day: 05/26 0701 - 05/27 0700 In: 1058.8 [P.O.:640; I.V.:251.8; IV Piggyback:167] Out: 3333 [Urine:25] Intake/Output this shift:    General appearance: cooperative, moderately obese and pale Neck: RIJ cath Resp: diminished breath sounds bilaterally and rales bibasilar Cardio: S1, S2 normal and rate 130s GI: pos bs, soft, liver down 6 cm Extremities: edema 2+  Lab Results:  Recent Labs  09/11/13 0430 09/12/13 0500  WBC 9.9 9.2  HGB 7.7* 8.1*  HCT 24.4* 25.6*  PLT 256 280   BMET:  Recent Labs  09/11/13 1548 09/12/13 0500  NA 133* 135*  K 5.3 4.4  CL 97 99  CO2 24 23  GLUCOSE 356* 302*  BUN 16 16  CREATININE 1.87* 1.87*  CALCIUM 8.1* 8.1*   No results found for this basename: PTH,  in the last 72 hours Iron Studies: No results found for this basename: IRON, TIBC, TRANSFERRIN, FERRITIN,  in the last 72 hours  Studies/Results: Dg Chest Port 1 View  09/10/2013   CLINICAL DATA:  Congestive heart failure.  EXAM: PORTABLE CHEST - 1 VIEW  COMPARISON:  09/06/2013.  FINDINGS: 0744 hr. Right IJ central venous catheter tip terminates at the lower SVC level. The heart size and mediastinal contours are stable. Pulmonary edema, bibasilar airspace opacities and bilateral pleural effusions are unchanged. There is no evidence of pneumothorax. Telemetry leads overlie the chest.  IMPRESSION: No significant change in edema, bilateral pleural effusions and bibasilar atelectasis.   Electronically Signed   By: Camie Patience  M.D.   On: 09/10/2013 09:31    I have reviewed the patient's current medications.  Assessment/Plan: 1 ESRD  tol vol off fair.  Acid/base/K/solute ok.  Will transition to IHD. To get PC today. 2 Anemia stable 3 DM improving  4 CAD per cards, CVS 5 HPTH meds 6 Obesity 7 ^ lipids will get better with less urine 8PVD 9 Carotid disease P Stop, CRRT, to get PC, CABG, Carotid, Perm access, epo, Vit D    LOS: 9 days   Ahman Dugdale L Jaden Abreu 09/12/2013,7:46 AM

## 2013-09-12 NOTE — Anesthesia Preprocedure Evaluation (Addendum)
Anesthesia Evaluation  Patient identified by MRN, date of birth, ID band Patient awake    Reviewed: Allergy & Precautions, H&P , NPO status , Patient's Chart, lab work & pertinent test results, reviewed documented beta blocker date and time   Airway Mallampati: III TM Distance: >3 FB Neck ROM: Full    Dental  (+) Teeth Intact, Dental Advisory Given, Poor Dentition   Pulmonary former smoker,  Hx lobectomy on left in 2013 for lung ca. breath sounds clear to auscultation        Cardiovascular hypertension, Pt. on medications and Pt. on home beta blockers + angina + CAD, + Peripheral Vascular Disease and +CHF Rhythm:Regular Rate:Normal     Neuro/Psych    GI/Hepatic GERD-  Medicated and Controlled,  Endo/Other  diabetes, Poorly Controlled, Type 2, Insulin DependentMorbid obesity  Renal/GU Renal Insufficiency and DialysisRenal disease     Musculoskeletal   Abdominal   Peds  Hematology  (+) Blood dyscrasia, anemia ,   Anesthesia Other Findings Two broken molars...  Reproductive/Obstetrics                          Anesthesia Physical  Anesthesia Plan  ASA: III  Anesthesia Plan: MAC   Post-op Pain Management:    Induction: Intravenous  Airway Management Planned:   Additional Equipment:   Intra-op Plan:   Post-operative Plan:   Informed Consent: I have reviewed the patients History and Physical, chart, labs and discussed the procedure including the risks, benefits and alternatives for the proposed anesthesia with the patient or authorized representative who has indicated his/her understanding and acceptance.   Dental advisory given  Plan Discussed with: CRNA  Anesthesia Plan Comments:         Anesthesia Quick Evaluation

## 2013-09-12 NOTE — Progress Notes (Signed)
Levophed restarted at 2215 d/t SBPs 60s-80s.  HR currently sustaining high 130s-low 140s ST, going up as high as 160 with frequent pvcs.  SBP now 100s-110s.  Pt dizzy with low BPs but currently asymptomatic.  Dr. Donneta Romberg paged and notified about HR/BP.  Orders received for iv lopressor.  Will continue to monitor.  Vista Lawman, RN

## 2013-09-12 NOTE — Progress Notes (Addendum)
VASCULAR LAB PRELIMINARY  PRELIMINARY  PRELIMINARY  PRELIMINARY  Right  Upper Extremity Vein Map    Cephalic  Segment Diameter Depth Comment  1. Axilla 5.56mm mm   2. Mid upper arm 4.54mm mm   3. Above AC 5.77mm mm branch  4. In AC 7.17mm mm   5. Below AC 3.32mm mm   6. Mid forearm 2.20mm mm branch  7. Wrist 2.71mm mm    mm mm    mm mm    mm mm    Basilic  Segment Diameter Depth Comment  1. Axilla 4.36mm 15.55mm   2. Mid upper arm 4.80mm 16.55mm   3. Above [facility] 3.46mm 18.67mm   4. In [facility] 4.90mm 31mm   5. Below AC 2.64mm 9.65mm   6. Mid forearm 1.3mm mm   7. Wrist mm mm    mm mm    mm mm    mm mm       Left Upper Extremity Vein Map    Cephalic  Segment Diameter Depth Comment  1. Axilla 3.14mm mm   2. Mid upper arm 3.33mm mm   3. Above AC 3.27mm mm   4. In AC 7.51mm mm   5. Below AC 3.44mm mm branch  6. Mid forearm 2.48mm mm   7. Wrist 2.30mm mm    mm mm    mm mm    mm mm    Basilic  Segment Diameter Depth Comment  1. Axilla 3.67mm 20.38mm   2. Mid upper arm 4.8mm 20.72mm   3. Above AC 3.6mm 17.27mm   4. In [facility] 3.47mm 11.93mm branch  5. Below AC 2.38mm 6.2mm branch  6. Mid forearm 1.74mm mm   7. Wrist mm mm    mm mm    mm mm    mm mm        Landry Mellow, RDMS, RVT 09/12/2013

## 2013-09-12 NOTE — Progress Notes (Signed)
ADVANCED HEART FAILURE ROUNDING NOTE   SUBJECTIVE  43 yo morbidly obese female with PMH of DM1, stage V kidney disease, blindness, h/o lung CA s/p resection who was recently discharged for heart failure symptom had a high risk stress test which showed ischemia in anterior and apical region. She underwent cath 09/04/2013 which showed chronically occluded RCA and triple vessel dx. CT surgery consulted. EF 22% by Myoview. 30-35% by echo  Stable overnight. Remains ST 130-140s and is going to OR today for perm catheter. CVVH will be transitioned to IHD after. Remains on levophed 5 mcg. Denies palpitations and SOB.      CURRENT MEDS . aspirin EC  81 mg Oral Daily  . atorvastatin  80 mg Oral q1800  . calcitRIOL  0.25 mcg Oral Daily  . darbepoetin (ARANESP) injection - NON-DIALYSIS  200 mcg Subcutaneous Q Wed-1800  . fentaNYL  25 mcg Intravenous Once  . fentaNYL  50 mcg Intravenous Once  . heparin  5,000 Units Subcutaneous 3 times per day  . insulin aspart  0-9 Units Subcutaneous TID WC  . insulin detemir  10 Units Subcutaneous q morning - 10a  . insulin detemir  32 Units Subcutaneous QHS  . lidocaine (PF)  5 mL Other Once  . metoprolol  2.5 mg Intravenous Once  . midazolam  2 mg Intravenous Once  . midazolam  2 mg Intravenous Once  . sodium chloride  10-40 mL Intracatheter Q12H  . sodium chloride  3 mL Intravenous Q12H    OBJECTIVE  Filed Vitals:   09/12/13 0845 09/12/13 0900 09/12/13 0915 09/12/13 0930  BP: 115/80 110/60 85/57 105/72  Pulse: 138 137 141 140  Temp:      TempSrc:      Resp: 16 16 18 20   Height:      Weight:      SpO2: 97% 94% 95% 98%    Intake/Output Summary (Last 24 hours) at 09/12/13 1000 Last data filed at 09/12/13 0900  Gross per 24 hour  Intake 1005.77 ml  Output   2857 ml  Net -1851.23 ml   Filed Weights   09/10/13 0545 09/11/13 0442 09/12/13 0600  Weight: 260 lb 9.3 oz (118.2 kg) 257 lb 15 oz (117 kg) 256 lb 6.3 oz (116.3 kg)    PHYSICAL  EXAM  General:Lying in bed; NAD Neuro: Alert and oriented X 3. Moves all extremities spontaneous HEENT:  Normal  Neck: Supple without bruits RIJ trialysis catheter Lungs:  clear Heart: tachy regular.  Abdomen: Soft, non-tender, non-distended, BS + x 4.  Extremities: No clubbing and cyanosis. DP/PT/Radials 2+ and equal bilaterally. tr pitting edema   CBC  Recent Labs  09/11/13 0430 09/12/13 0500  WBC 9.9 9.2  HGB 7.7* 8.1*  HCT 24.4* 25.6*  MCV 94.9 95.2  PLT 256 119   Basic Metabolic Panel  Recent Labs  09/11/13 0430 09/11/13 1548 09/12/13 0500  NA 137 133* 135*  K 4.5 5.3 4.4  CL 101 97 99  CO2 25 24 23   GLUCOSE 221* 356* 302*  BUN 14 16 16   CREATININE 1.81* 1.87* 1.87*  CALCIUM 7.9* 8.1* 8.1*  MG 2.4  --  2.4  PHOS 2.2* 2.6 2.6   Liver Function Tests  Recent Labs  09/11/13 0430 09/11/13 1548 09/12/13 0500  AST 14  --  19  ALT 15  --  17  ALKPHOS 61  --  72  BILITOT 0.3  --  0.3  PROT 5.4*  --  5.6*  ALBUMIN 2.1* 2.2* 2.2*   Cardiac Enzymes No results found for this basename: CKTOTAL, CKMB, CKMBINDEX, TROPONINI,  in the last 72 hours Thyroid Function Tests No results found for this basename: TSH, T4TOTAL, FREET3, T3FREE, THYROIDAB,  in the last 72 hours  TELE  Sinus tachycardia 120-130s ECG  Sinus tachycardia with HR 107 and poor R wave progression in anterior leads  Radiology/Studies  Dg Chest 2 View  09/06/2013   CLINICAL DATA:  CHF, intermittent dyspnea  EXAM: CHEST  2 VIEW  COMPARISON:  Prior chest x-ray 08/13/2013; prior chest CT 08/22/2013  FINDINGS: Stable cardiac and mediastinal contours. Atherosclerotic calcifications are present within the transverse aorta. Moderate left and small right pleural effusions are similar compared to prior. There is persistent associated bibasilar atelectasis. Mild pulmonary vascular congestion without overt edema. No pneumothorax. No new focal airspace consolidation. No acute osseous abnormality.   IMPRESSION: 1. Stable moderate left and small right pleural effusions and associated bibasilar atelectasis. 2. Pulmonary vascular congestion without overt edema.   Electronically Signed   By: Jacqulynn Cadet M.D.   On: 09/06/2013 07:55   Ct Chest Wo Contrast  08/22/2013   CLINICAL DATA:  Left lung cancer status post lobectomy. Weight gain. Renal insufficiency.  EXAM: CT CHEST WITHOUT CONTRAST  TECHNIQUE: Multidetector CT imaging of the chest was performed following the standard protocol without IV contrast.  COMPARISON:  Radiographs 08/13/2013 and 08/04/2013.  CT 08/02/2012.  FINDINGS: There are stable postsurgical changes related to prior left lower lobe resection. There has been interval enlargement of several mediastinal lymph nodes. These include 12 mm right paratracheal (image 19), 13 mm precarinal (image 23), and 14 mm AP window (image 25) lymph nodes. Some of these nodes have retained fatty hila. Allowing for the limitations of noncontrast technique, the hila appear stable.  There is stable low-density within the right thyroid lobe. Atherosclerosis of the aorta, great vessels and coronary arteries is noted. The heart size is normal. There is no pericardial effusion.  Moderate size dependent pleural effusions are present bilaterally. There is associated dependent airspace disease in both lower lobes. In addition, there are more focal airspace opacities within the right lower lobe and inferior aspect of the left upper lobe. The latter is somewhat nodular, measuring up to 1.8 cm on image 43. No other focal nodularity or endobronchial lesions are demonstrated.  The visualized upper abdomen has a stable appearance. There is no adrenal mass. There is increased subcutaneous edema throughout the subcutaneous fat, especially within the anterior aspect of the upper abdomen.  No worrisome osseous findings are demonstrated.  IMPRESSION: 1. As demonstrated radiographically, there are bilateral pleural effusions and  bilateral airspace opacities which are new compared with the prior CT. Although there are focal somewhat nodular components in the right lower and left upper lobes, these findings are most likely infectious/inflammatory. 2. Progressive mediastinal lymphadenopathy. Some of the nodes have retained fatty hila and may be reactive. Metastatic disease cannot be completely excluded. 3. Stable atherosclerosis and low-density within the right thyroid lobe. 4. If the airspace opacities and pleural effusions fail to respond to appropriate clinical therapy, follow-up CT or PET-CT may be warranted to exclude metastatic disease.   Electronically Signed   By: Camie Patience M.D.   On: 08/22/2013 10:26   Dg Chest Portable 1 View  08/13/2013   CLINICAL DATA:  Shortness of breath increasing for 2 weeks. History of CHF.  EXAM: PORTABLE CHEST - 1 VIEW  COMPARISON:  08/04/2013  FINDINGS: Again noted  are bibasilar lung densities that are suggestive for pleural effusions and atelectasis. Heart size appears to be enlarged. There is some peribronchial thickening and cannot exclude pulmonary edema.  IMPRESSION: Bilateral pleural effusions with basilar atelectasis. There is mild pulmonary edema. Minimal change from the previous examination.   Electronically Signed   By: Markus Daft M.D.   On: 08/13/2013 23:10    ASSESSMENT AND PLAN 1. Cardiogenic shock 2. Acute on chronic systolic HF     EF ~07-86% 3. 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 Acute respiratory failure 5. 3-V CAD 6. Carotid stenosis     - doppler 08/31/2013 shows R 60-79% ICA stenosis, L 80-99% ICA stenosis     - vascular surgery aware 7. 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 DM1   Stable overnight. No longer receiving metoprolol with levophed being restarted. HR remains 130-140s and appears to be ST. Will consult EP for recommendations.  She is going to the OR today for HD  perm cath placement and will transition to IHD after. May need central access once trialyisis catheter placed and we can place if needed.  Co-ox 62%, will continue milrinone 0.25. Will likely need swan to assess hemodynamics.   3V CAD continue statin and ASA. Will need to optimize before to CABG/CEA.  Rande Brunt, NP-C 10:00 AM   Patient seen and examined with Junie Bame, NP. We discussed all aspects of the encounter. I agree with the assessment and plan as stated above.   Volume status much improved. Down ~ 40 pounds with CVVHD. However she remains quite tachycardic. I have reviewed with Dr. Caryl Comes who also feels it is sinus tach. She has not been able to tolerate low-dose b-blocker and no result with digoxin. We will see if she improves once CVVHD is stopped and if we can wean levophed. May have to continue trial of ivabradine. For permcath today.   Shaune Pascal Eduardo Wurth,MD 5:14 PM

## 2013-09-12 NOTE — Anesthesia Postprocedure Evaluation (Addendum)
Anesthesia Post Note  Patient: Hannah [name]  Procedure(s) Performed: Procedure(s) (LRB): INSERTION OF DIALYSIS CATHETER (Left)  Anesthesia type: MAC  Patient location: PACU  Post pain: Pain level controlled  Post assessment: Post-op Vital signs reviewed  Last Vitals: BP 141/55  Pulse 135  Temp(Src) 36.7 C (Oral)  Resp 23  Ht 5' 7.5" (1.715 m)  Wt 256 lb 6.3 oz (116.3 kg)  BMI 39.54 kg/m2  SpO2 94%  LMP 08/19/2013  Post vital signs: Reviewed  Level of consciousness: awake  Complications: No apparent anesthesia complications

## 2013-09-12 NOTE — H&P (View-Only) (Signed)
       Plan :  Dr. Kellie Simmering to place permanent Diatek catheter on the right side.  She will need Left carotid surgery in the future.    Ulyses Amor PA-C   Pt currently with temp right IJ catheter.  She will need left CEA soon so we will try to keep catheter on right side.  No plans for permanent access until recovered from CABG by Dr Prescott Gum and CEA by Dr Trula Slade.  Ruta Hinds, MD [facility]: [phone] Pager: [phone]

## 2013-09-12 NOTE — Interval H&P Note (Signed)
History and Physical Interval Note:  09/12/2013 10:47 AM  Hannah [name]  has presented today for surgery, with the diagnosis of End stage renal disease   The various methods of treatment have been discussed with the patient and family. After consideration of risks, benefits and other options for treatment, the patient has consented to  Procedure(s): INSERTION OF DIALYSIS CATHETER (Right) as a surgical intervention .  The patient's history has been reviewed, patient examined, no change in status, stable for surgery.  I have reviewed the patient's chart and labs.  Questions were answered to the patient's satisfaction.     Mal Misty

## 2013-09-12 NOTE — Progress Notes (Signed)
Pt back from OR, HR 140's. MD called and orders for CVP set up and 2.5 of lopressor were received. MD notified of CVP 11, and HR 130's post lopressor administration. No new orders received at this time. Pt awake, oriented, and with no complaints of pain.

## 2013-09-13 ENCOUNTER — Encounter (HOSPITAL_COMMUNITY)
Admission: EM | Disposition: A | Discharge status: Home Health | Payer: Self-pay | Source: Home / Self Care | Attending: Interventional Cardiology

## 2013-09-13 ENCOUNTER — Encounter (HOSPITAL_COMMUNITY): Payer: Self-pay | Admitting: Vascular Surgery

## 2013-09-13 DIAGNOSIS — R570 Cardiogenic shock: Secondary | ICD-10-CM

## 2013-09-13 HISTORY — PX: INTRA-AORTIC BALLOON PUMP INSERTION: SHX5475

## 2013-09-13 HISTORY — PX: RIGHT HEART CATHETERIZATION: SHX5447

## 2013-09-13 LAB — CBC
HCT: 25.3 % — ABNORMAL LOW (ref 36.0–46.0)
Hemoglobin: 8 g/dL — ABNORMAL LOW (ref 12.0–15.0)
MCH: 30.4 pg (ref 26.0–34.0)
MCHC: 31.6 g/dL (ref 30.0–36.0)
MCV: 96.2 fL (ref 78.0–100.0)
PLATELETS: 222 10*3/uL (ref 150–400)
RBC: 2.63 MIL/uL — ABNORMAL LOW (ref 3.87–5.11)
RDW: 15.8 % — AB (ref 11.5–15.5)
WBC: 7.1 10*3/uL (ref 4.0–10.5)

## 2013-09-13 LAB — POCT I-STAT 3, VENOUS BLOOD GAS (G3P V)
ACID-BASE DEFICIT: 4 mmol/L — AB (ref 0.0–2.0)
ACID-BASE DEFICIT: 4 mmol/L — AB (ref 0.0–2.0)
Bicarbonate: 22 mEq/L (ref 20.0–24.0)
Bicarbonate: 22.8 mEq/L (ref 20.0–24.0)
O2 SAT: 38 %
O2 SAT: 40 %
PO2 VEN: 25 mmHg — AB (ref 30.0–45.0)
TCO2: 23 mmol/L (ref 0–100)
TCO2: 24 mmol/L (ref 0–100)
pCO2, Ven: 45.5 mmHg (ref 45.0–50.0)
pCO2, Ven: 46.7 mmHg (ref 45.0–50.0)
pH, Ven: 7.292 (ref 7.250–7.300)
pH, Ven: 7.296 (ref 7.250–7.300)
pO2, Ven: 25 mmHg — CL (ref 30.0–45.0)

## 2013-09-13 LAB — POCT I-STAT 3, ART BLOOD GAS (G3+)
ACID-BASE DEFICIT: 5 mmol/L — AB (ref 0.0–2.0)
Bicarbonate: 21 mEq/L (ref 20.0–24.0)
O2 Saturation: 94 %
PO2 ART: 74 mmHg — AB (ref 80.0–100.0)
Patient temperature: 36.7
TCO2: 22 mmol/L (ref 0–100)
pCO2 arterial: 39.4 mmHg (ref 35.0–45.0)
pH, Arterial: 7.333 — ABNORMAL LOW (ref 7.350–7.450)

## 2013-09-13 LAB — PHOSPHORUS: Phosphorus: 4.9 mg/dL — ABNORMAL HIGH (ref 2.3–4.6)

## 2013-09-13 LAB — COMPREHENSIVE METABOLIC PANEL
ALK PHOS: 66 U/L (ref 39–117)
ALT: 14 U/L (ref 0–35)
AST: 12 U/L (ref 0–37)
Albumin: 2.3 g/dL — ABNORMAL LOW (ref 3.5–5.2)
BUN: 29 mg/dL — ABNORMAL HIGH (ref 6–23)
CO2: 21 meq/L (ref 19–32)
Calcium: 8.3 mg/dL — ABNORMAL LOW (ref 8.4–10.5)
Chloride: 97 mEq/L (ref 96–112)
Creatinine, Ser: 3.5 mg/dL — ABNORMAL HIGH (ref 0.50–1.10)
GFR, EST AFRICAN AMERICAN: 17 mL/min — AB (ref >90)
GFR, EST NON AFRICAN AMERICAN: 15 mL/min — AB (ref >90)
Glucose, Bld: 391 mg/dL — ABNORMAL HIGH (ref 70–99)
POTASSIUM: 5.1 meq/L (ref 3.7–5.3)
SODIUM: 133 meq/L — AB (ref 137–147)
TOTAL PROTEIN: 5.5 g/dL — AB (ref 6.0–8.3)
Total Bilirubin: 0.2 mg/dL — ABNORMAL LOW (ref 0.3–1.2)

## 2013-09-13 LAB — RENAL FUNCTION PANEL
Albumin: 2.1 g/dL — ABNORMAL LOW (ref 3.5–5.2)
BUN: 34 mg/dL — ABNORMAL HIGH (ref 6–23)
CHLORIDE: 96 meq/L (ref 96–112)
CO2: 22 meq/L (ref 19–32)
CREATININE: 4.11 mg/dL — AB (ref 0.50–1.10)
Calcium: 8.1 mg/dL — ABNORMAL LOW (ref 8.4–10.5)
GFR calc Af Amer: 14 mL/min — ABNORMAL LOW (ref >90)
GFR calc non Af Amer: 12 mL/min — ABNORMAL LOW (ref >90)
Glucose, Bld: 309 mg/dL — ABNORMAL HIGH (ref 70–99)
POTASSIUM: 4.8 meq/L (ref 3.7–5.3)
Phosphorus: 5.4 mg/dL — ABNORMAL HIGH (ref 2.3–4.6)
Sodium: 130 mEq/L — ABNORMAL LOW (ref 137–147)

## 2013-09-13 LAB — GLUCOSE, CAPILLARY
Glucose-Capillary: 305 mg/dL — ABNORMAL HIGH (ref 70–99)
Glucose-Capillary: 282 mg/dL — ABNORMAL HIGH (ref 70–99)
Glucose-Capillary: 144 mg/dL — ABNORMAL HIGH (ref 70–99)

## 2013-09-13 LAB — CARBOXYHEMOGLOBIN
CARBOXYHEMOGLOBIN: 2 % — AB (ref 0.5–1.5)
Carboxyhemoglobin: 2 % — ABNORMAL HIGH (ref 0.5–1.5)
Methemoglobin: 1.1 % (ref 0.0–1.5)
Methemoglobin: 1 % (ref 0.0–1.5)
O2 Saturation: 45.5 %
O2 Saturation: 51.4 %
Total hemoglobin: 7.5 g/dL — ABNORMAL LOW (ref 12.0–16.0)
Total hemoglobin: 7.4 g/dL — ABNORMAL LOW (ref 12.0–16.0)

## 2013-09-13 LAB — APTT: aPTT: 29 seconds (ref 24–37)

## 2013-09-13 LAB — HEPARIN LEVEL (UNFRACTIONATED): HEPARIN UNFRACTIONATED: 0.62 [IU]/mL (ref 0.30–0.70)

## 2013-09-13 LAB — MAGNESIUM: Magnesium: 2.5 mg/dL (ref 1.5–2.5)

## 2013-09-13 SURGERY — INTRA-AORTIC BALLOON PUMP INSERTION
Anesthesia: LOCAL

## 2013-09-13 MED ORDER — LIDOCAINE HCL (PF) 1 % IJ SOLN: 5.0000 mL | INTRAMUSCULAR | Status: DC | PRN

## 2013-09-13 MED ORDER — HEPARIN (PORCINE) IN NACL 2-0.9 UNIT/ML-% IJ SOLN
INTRAMUSCULAR | Status: AC
  Filled 2013-09-13: qty 1000

## 2013-09-13 MED ORDER — POLYETHYLENE GLYCOL 3350 17 G PO PACK
17.0000 g | PACK | Freq: Every day | ORAL | Status: DC | PRN
  Administered 2013-09-16 (×2): 17 g via ORAL
  Filled 2013-09-13 (×3): qty 1

## 2013-09-13 MED ORDER — SODIUM CHLORIDE 0.9 % IV SOLN: 100.0000 mL | INTRAVENOUS | Status: DC | PRN

## 2013-09-13 MED ORDER — HEPARIN SODIUM (PORCINE) 1000 UNIT/ML DIALYSIS: 1000.0000 [IU] | INTRAMUSCULAR | Status: DC | PRN

## 2013-09-13 MED ORDER — INSULIN DETEMIR 100 UNIT/ML ~~LOC~~ SOLN
34.0000 [IU] | Freq: Every day | SUBCUTANEOUS | Status: DC
  Administered 2013-09-13 – 2013-09-19 (×7): 34 [IU] via SUBCUTANEOUS
  Filled 2013-09-13 (×9): qty 0.34

## 2013-09-13 MED ORDER — NEPRO/CARBSTEADY PO LIQD
237.0000 mL | ORAL | Status: DC | PRN
  Filled 2013-09-13 (×4): qty 237

## 2013-09-13 MED ORDER — LIDOCAINE HCL (PF) 1 % IJ SOLN
INTRAMUSCULAR | Status: AC
  Filled 2013-09-13: qty 30

## 2013-09-13 MED ORDER — ALTEPLASE 2 MG IJ SOLR
2.0000 mg | Freq: Once | INTRAMUSCULAR | Status: AC
  Administered 2013-09-13: 2 mg
  Filled 2013-09-13: qty 2

## 2013-09-13 MED ORDER — HEPARIN (PORCINE) IN NACL 100-0.45 UNIT/ML-% IJ SOLN
1500.0000 [IU]/h | INTRAMUSCULAR | Status: DC
  Administered 2013-09-13: 1700 [IU]/h via INTRAVENOUS
  Administered 2013-09-14: 1500 [IU]/h via INTRAVENOUS
  Filled 2013-09-13 (×4): qty 250

## 2013-09-13 MED ORDER — HEPARIN SODIUM (PORCINE) 1000 UNIT/ML DIALYSIS
100.0000 [IU]/kg | INTRAMUSCULAR | Status: DC | PRN
  Administered 2013-09-14: 11600 [IU] via INTRAVENOUS_CENTRAL
  Filled 2013-09-13: qty 12

## 2013-09-13 MED ORDER — SODIUM CHLORIDE 0.9 % IV SOLN: 250.0000 mL | INTRAVENOUS | Status: DC | PRN

## 2013-09-13 MED ORDER — SODIUM CHLORIDE 0.9 % IJ SOLN: 3.0000 mL | INTRAMUSCULAR | Status: DC | PRN

## 2013-09-13 MED ORDER — TRAMADOL HCL 50 MG PO TABS
50.0000 mg | ORAL_TABLET | Freq: Four times a day (QID) | ORAL | Status: DC | PRN
  Administered 2013-09-14 – 2013-09-19 (×7): 50 mg via ORAL
  Administered 2013-09-23: 17:00:00 via ORAL
  Administered 2013-09-24 – 2013-10-28 (×29): 50 mg via ORAL
  Filled 2013-09-13 (×39): qty 1

## 2013-09-13 MED ORDER — PENTAFLUOROPROP-TETRAFLUOROETH EX AERO: 1.0000 "application " | INHALATION_SPRAY | CUTANEOUS | Status: DC | PRN

## 2013-09-13 MED ORDER — INSULIN ASPART 100 UNIT/ML ~~LOC~~ SOLN
3.0000 [IU] | Freq: Three times a day (TID) | SUBCUTANEOUS | Status: DC
  Administered 2013-09-14 – 2013-09-15 (×2): 3 [IU] via SUBCUTANEOUS
  Administered 2013-09-15: 21:00:00 via SUBCUTANEOUS
  Administered 2013-09-16 – 2013-09-19 (×4): 3 [IU] via SUBCUTANEOUS

## 2013-09-13 MED ORDER — ALTEPLASE 2 MG IJ SOLR
2.0000 mg | Freq: Once | INTRAMUSCULAR | Status: AC | PRN
  Filled 2013-09-13: qty 2

## 2013-09-13 MED ORDER — SODIUM CHLORIDE 0.9 % IJ SOLN
3.0000 mL | Freq: Two times a day (BID) | INTRAMUSCULAR | Status: DC
  Administered 2013-09-14 – 2013-09-20 (×7): 3 mL via INTRAVENOUS

## 2013-09-13 MED ORDER — LIDOCAINE-PRILOCAINE 2.5-2.5 % EX CREA: 1.0000 "application " | TOPICAL_CREAM | CUTANEOUS | Status: DC | PRN

## 2013-09-13 MED ORDER — INSULIN ASPART 100 UNIT/ML ~~LOC~~ SOLN
0.0000 [IU] | Freq: Three times a day (TID) | SUBCUTANEOUS | Status: DC
  Administered 2013-09-13: 5 [IU] via SUBCUTANEOUS
  Administered 2013-09-14: 2 [IU] via SUBCUTANEOUS
  Administered 2013-09-14 – 2013-09-15 (×2): 3 [IU] via SUBCUTANEOUS
  Administered 2013-09-15 (×2): 2 [IU] via SUBCUTANEOUS
  Administered 2013-09-16: 3 [IU] via SUBCUTANEOUS
  Administered 2013-09-16: 2 [IU] via SUBCUTANEOUS
  Administered 2013-09-16 – 2013-09-17 (×2): 3 [IU] via SUBCUTANEOUS
  Administered 2013-09-17: 1 [IU] via SUBCUTANEOUS
  Administered 2013-09-17: 2 [IU] via SUBCUTANEOUS
  Administered 2013-09-18: 3 [IU] via SUBCUTANEOUS
  Administered 2013-09-18: 2 [IU] via SUBCUTANEOUS
  Administered 2013-09-18: 3 [IU] via SUBCUTANEOUS
  Administered 2013-09-19: 2 [IU] via SUBCUTANEOUS
  Administered 2013-09-19: 1 [IU] via SUBCUTANEOUS
  Administered 2013-09-20: 9 [IU] via SUBCUTANEOUS

## 2013-09-13 MED ORDER — MIDAZOLAM HCL 2 MG/2ML IJ SOLN
INTRAMUSCULAR | Status: AC
Start: 2013-09-13 — End: 2013-09-13
  Filled 2013-09-13: qty 2

## 2013-09-13 MED ORDER — FENTANYL CITRATE 0.05 MG/ML IJ SOLN
INTRAMUSCULAR | Status: AC
  Filled 2013-09-13: qty 2

## 2013-09-13 NOTE — Progress Notes (Signed)
ANTICOAGULATION CONSULT NOTE - Initial Consult  Pharmacy Consult for heparin  Indication: IABP  Allergies  Allergen Reactions  . Crestor [Rosuvastatin] Other (See Comments)    Severe muscle weakness  . Nsaids Other (See Comments)    Not allergic, "bad on my kidneys"  . Ciprofloxacin Rash    Patient Measurements: Height: 5' 7.5" (171.5 cm) Weight: 263 lb 14.3 oz (119.7 kg) IBW/kg (Calculated) : 62.75 Heparin Dosing Weight: 91kg  Vital Signs: Temp: 99.1 F (37.3 C) (05/28 0755) Temp src: Oral (05/28 0755) BP: 117/93 mmHg (05/28 1000) Pulse Rate: 132 (05/28 1040)  Labs:  Recent Labs  09/11/13 0430  09/12/13 0500 09/12/13 1600 09/13/13 0428  HGB 7.7*  --  8.1*  --  8.0*  HCT 24.4*  --  25.6*  --  25.3*  PLT 256  --  280  --  222  APTT 163*  --  >200*  --  29  CREATININE 1.81*  < > 1.87* 2.58* 3.50*  < > = values in this interval not displayed.  Estimated Creatinine Clearance: 27.4 ml/min (by C-G formula based on Cr of 3.5).   Medical History: Past Medical History  Diagnosis Date  . High cholesterol   . Diabetic retinopathy   . Peripheral neuropathy     "tips of toes"  . Blind right eye   . CHF (congestive heart failure)   . CAD (coronary artery disease)   . GERD (gastroesophageal reflux disease)   . Hypertension   . History of lung cancer 07/2011    s/p left lower lobectomy  . Diabetes mellitus     IDDM  . Cataract of right eye   . Ulcer of toe of right foot 07/10/2012    great toe  . Breast calcification, left 06/2012  . Acute biphenotypic leukemia   . CKD (chronic kidney disease) stage 5, GFR less than 15 ml/min   . Nephrotic syndrome   . Gastritis     H/o gastritis on prior endoscopy  . Anemia   . Carotid artery disease    Assessment: 43 year old female awaiting potential open heart surgery, now with low output heart failure requiring invasive cardiac monitoring and hemodynamic support with IABP placed in cath lab today. Patient received 4000  units of heparin during procedure, orders to now continue with infusion. Patient known to pharmacy service for previous heparin dosing, now with lower range so will drop initial rate down to accommodate. No bleeding complications noted during cath procedure.  Goal of Therapy:  Heparin level 0.2-0.5 units/ml Monitor platelets by anticoagulation protocol: Yes   Plan:  Start heparin infusion at 1700 units/hr Check anti-Xa level in 8 hours and daily while on heparin Continue to monitor H&H and platelets  Erin Hearing PharmD., BCPS Clinical Pharmacist Pager [phone] 09/13/2013 2:10 PM

## 2013-09-13 NOTE — CV Procedure (Signed)
Cardiac Cath Procedure Note:  Indication: Cardiogenic shock  Procedures performed:  1) IABP placement 2) Right heart catheterization  Description of procedure:   The risks and indication of the procedure were explained. Consent was signed and placed on the chart. An appropriate timeout was taken prior to the procedure.   The right chest was prepped and draped in sterile fashiomn and 7FR sheath was placed in the R subclavian vein. A RHC was done with a standard Gordy Councilman catheter.  The right groin was prepped and draped in the routine sterile fashion and anesthetized with 1% local lidocaine.   A 7.5 FR IABP sheath was placed in the right femoral artery using a modified Seldinger technique. A 40 cm IABP was placed over a wire under fluoroscopic guidance. Once appropriate position was confirmed the IABP was flushed and then was sutured in place.  Complications:  None apparent  Results:   RA = 14 RV = 49/9/12 PA = 54/30 (40) PCW = 26 Fick CO/CI: 4.9/2.2 Thermo CO/CI: 5.7/2.5 PA Sat: 38%, 40% FA Sat: 96% PVR = 2.5 Woods  Assessment: 1) Cardiogenic shock 2) Successful IABP placement   Continue supportive care. Review revascularization options with interventional team.   Shaune Pascal Bensimhon,MD 12:22 PM

## 2013-09-13 NOTE — Progress Notes (Addendum)
ANTICOAGULATION CONSULT NOTE - Follow Up Consult  Pharmacy Consult for Heparin  Indication: IABP, awaiting OHS  Allergies  Allergen Reactions  . Crestor [Rosuvastatin] Other (See Comments)    Severe muscle weakness  . Nsaids Other (See Comments)    Not allergic, "bad on my kidneys"  . Ciprofloxacin Rash   Patient Measurements: Height: 5' 7.5" (171.5 cm) Weight: 266 lb 12.1 oz (121 kg) (bed wt ?) IBW/kg (Calculated) : 62.75 Heparin Dosing Weight: 91 kg  Vital Signs: Temp: 98.8 F (37.1 C) (05/28 2200) Temp src: Core (Comment) (05/28 1914) BP: 91/56 mmHg (05/28 2000) Pulse Rate: 65 (05/28 2200)  Labs:  Recent Labs  09/11/13 0430  09/12/13 0500 09/12/13 1600 09/13/13 0428 09/13/13 1600 09/13/13 2219  HGB 7.7*  --  8.1*  --  8.0*  --   --   HCT 24.4*  --  25.6*  --  25.3*  --   --   PLT 256  --  280  --  222  --   --   APTT 163*  --  >200*  --  29  --   --   HEPARINUNFRC  --   --   --   --   --   --  0.62  CREATININE 1.81*  < > 1.87* 2.58* 3.50* 4.11*  --   < > = values in this interval not displayed.  Estimated Creatinine Clearance: 23.5 ml/min (by C-G formula based on Cr of 4.11).  Medications:  Heparin 1700 units/hr  Assessment: 43 y/o F with IABP in place, awaiting potential OHS, first HL is 0.62 (goal 0.2-0.5), Hgb low and stable, other labs as above.   Goal of Therapy:  Heparin level 0.2-0.5 units/ml Monitor platelets by anticoagulation protocol: Yes   Plan:  -Decrease heparin drip to 1500 units/hr -0500 HL -Daily CBC/HL -Monitor for bleeding  Narda Bonds 09/13/2013,11:06 PM  Addendum 5:57 AM Repeat HL this AM is 0.25 (at goal of 0.2-0.5) -Continue heparin at 1500 units/hr -1330 HL to confirm Sharlynn Oliphant, PharmD

## 2013-09-13 NOTE — Progress Notes (Signed)
  [facility] Progress Note  09/13/2013 9:34 AM 1 Day Post-Op  Subjective:  Pt feeling tired but overall okay.   Filed Vitals:   09/13/13 0900  BP: 137/66  Pulse: 144  Temp:   Resp: 20    Physical Exam: Left IJ TDC in place.    CBC    Component Value Date/Time   WBC 7.1 09/13/2013 0428   RBC 2.63* 09/13/2013 0428   HGB 8.0* 09/13/2013 0428   HCT 25.3* 09/13/2013 0428   PLT 222 09/13/2013 0428   MCV 96.2 09/13/2013 0428   MCH 30.4 09/13/2013 0428   MCHC 31.6 09/13/2013 0428   RDW 15.8* 09/13/2013 0428   LYMPHSABS 1.4 08/04/2013 1223   MONOABS 0.4 08/04/2013 1223   EOSABS 0.2 08/04/2013 1223   BASOSABS 0.1 08/04/2013 1223    BMET    Component Value Date/Time   NA 133* 09/13/2013 0428   K 5.1 09/13/2013 0428   CL 97 09/13/2013 0428   CO2 21 09/13/2013 0428   GLUCOSE 391* 09/13/2013 0428   BUN 29* 09/13/2013 0428   CREATININE 3.50* 09/13/2013 0428   CREATININE 4.17* 07/06/2013 1618   CALCIUM 8.3* 09/13/2013 0428   GFRNONAA 15* 09/13/2013 0428   GFRNONAA 71 07/13/2011 0835   GFRAA 17* 09/13/2013 0428   GFRAA 82 07/13/2011 0835    INR    Component Value Date/Time   INR 1.13 09/06/2013 0740     Intake/Output Summary (Last 24 hours) at 09/13/13 0934 Last data filed at 09/13/13 0900  Gross per 24 hour  Intake 922.99 ml  Output     51 ml  Net 871.99 ml     Assessment:  43 y.o. female is s/p:  Left IJ diatek catheter insertion.  1 Day Post-Op  Plan: -ESRD: will start dialysis today.  -No plans in near for CEA by Dr. Trula Slade until patient is recovered from CABG.  -DVT prophylaxis:  SQ heparin.    Virgina Jock, PA-C [facility] Office: [phone] Pager: [phone] 09/13/2013 9:34 AM

## 2013-09-13 NOTE — Interval H&P Note (Signed)
History and Physical Interval Note:  09/13/2013 12:17 PM  Hannah [name]  has presented today for surgery, with the diagnosis of heart failure  The various methods of treatment have been discussed with the patient and family. After consideration of risks, benefits and other options for treatment, the patient has consented to  Procedure(s): [place] (N/A) and RIGHT HEART CATH as a surgical intervention .  The patient's history has been reviewed, patient examined, no change in status, stable for surgery.  I have reviewed the patient's chart and labs.  Questions were answered to the patient's satisfaction.     Shaune Pascal Bev Drennen

## 2013-09-13 NOTE — Progress Notes (Signed)
ADVANCED HEART FAILURE ROUNDING NOTE   SUBJECTIVE  43 yo morbidly obese female with PMH of DM1, stage V kidney disease, blindness, h/o lung CA s/p resection who was recently discharged for heart failure symptom had a high risk stress test which showed ischemia in anterior and apical region. She underwent cath 09/04/2013 which showed chronically occluded RCA and triple vessel dx. CT surgery consulted. EF 22% by Myoview. 30-35% by echo  Underwent per cath placement in OR yesterday. Feels much worse today. Fatigued. + ab pain and bloating. More dyspneic.  Remains ST 130-140s Remains on levophed 5 mcg. Co-ox 45%    CURRENT MEDS . aspirin EC  81 mg Oral Daily  . atorvastatin  80 mg Oral q1800  . calcitRIOL  0.25 mcg Oral Daily  . darbepoetin (ARANESP) injection - NON-DIALYSIS  200 mcg Subcutaneous Q Wed-1800  . fentaNYL  25 mcg Intravenous Once  . fentaNYL  50 mcg Intravenous Once  . heparin  5,000 Units Subcutaneous 3 times per day  . insulin aspart  0-9 Units Subcutaneous TID WC  . insulin detemir  10 Units Subcutaneous q morning - 10a  . insulin detemir  32 Units Subcutaneous QHS  . lidocaine (PF)  5 mL Other Once  . metoprolol  2.5 mg Intravenous Once  . midazolam  2 mg Intravenous Once  . midazolam  2 mg Intravenous Once  . sodium chloride  10-40 mL Intracatheter Q12H  . sodium chloride  3 mL Intravenous Q12H    OBJECTIVE  Filed Vitals:   09/13/13 0800 09/13/13 0900 09/13/13 1000 09/13/13 1040  BP: 132/79 137/66 117/93   Pulse: 143 144 142 132  Temp:      TempSrc:      Resp: 22 20 19    Height:      Weight:      SpO2: 96% 95% 96%     Intake/Output Summary (Last 24 hours) at 09/13/13 1237 Last data filed at 09/13/13 1000  Gross per 24 hour  Intake 891.89 ml  Output      0 ml  Net 891.89 ml   Filed Weights   09/11/13 0442 09/12/13 0600 09/13/13 0500  Weight: 117 kg (257 lb 15 oz) 116.3 kg (256 lb 6.3 oz) 119.7 kg (263 lb 14.3 oz)    PHYSICAL EXAM  General:Lying  in bed; weak. Mildly SOB Neuro: Alert and oriented X 3. Moves all extremities spontaneous HEENT:  Normal  Neck: Supple without bruits RIJ trialysis catheter. Chest L subclav PC Lungs:  clear Heart: tachy regular. +s3 Abdomen: Soft, non-tender, non-distended, BS + x 4.  Extremities: No clubbing and cyanosis. DP/PT/Radials 2+ and equal bilaterally. 1= pitting edema   CBC  Recent Labs  09/12/13 0500 09/13/13 0428  WBC 9.2 7.1  HGB 8.1* 8.0*  HCT 25.6* 25.3*  MCV 95.2 96.2  PLT 280 540   Basic Metabolic Panel  Recent Labs  09/12/13 0500 09/12/13 1600 09/13/13 0428  NA 135* 132* 133*  K 4.4 4.7 5.1  CL 99 96 97  CO2 23 23 21   GLUCOSE 302* 271* 391*  BUN 16 19 29*  CREATININE 1.87* 2.58* 3.50*  CALCIUM 8.1* 8.0* 8.3*  MG 2.4  --  2.5  PHOS 2.6 3.4 4.9*   Liver Function Tests  Recent Labs  09/12/13 0500 09/12/13 1600 09/13/13 0428  AST 19  --  12  ALT 17  --  14  ALKPHOS 72  --  66  BILITOT 0.3  --  0.2*  PROT 5.6*  --  5.5*  ALBUMIN 2.2* 2.1* 2.3*   Cardiac Enzymes No results found for this basename: CKTOTAL, CKMB, CKMBINDEX, TROPONINI,  in the last 72 hours Thyroid Function Tests No results found for this basename: TSH, T4TOTAL, FREET3, T3FREE, THYROIDAB,  in the last 72 hours  TELE  Sinus tachycardia 120-130s ECG  Sinus tachycardia with HR 107 and poor R wave progression in anterior leads  Radiology/Studies  Dg Chest 2 View  09/06/2013   CLINICAL DATA:  CHF, intermittent dyspnea  EXAM: CHEST  2 VIEW  COMPARISON:  Prior chest x-ray 08/13/2013; prior chest CT 08/22/2013  FINDINGS: Stable cardiac and mediastinal contours. Atherosclerotic calcifications are present within the transverse aorta. Moderate left and small right pleural effusions are similar compared to prior. There is persistent associated bibasilar atelectasis. Mild pulmonary vascular congestion without overt edema. No pneumothorax. No new focal airspace consolidation. No acute osseous  abnormality.  IMPRESSION: 1. Stable moderate left and small right pleural effusions and associated bibasilar atelectasis. 2. Pulmonary vascular congestion without overt edema.   Electronically Signed   By: Jacqulynn Cadet M.D.   On: 09/06/2013 07:55   Ct Chest Wo Contrast  08/22/2013   CLINICAL DATA:  Left lung cancer status post lobectomy. Weight gain. Renal insufficiency.  EXAM: CT CHEST WITHOUT CONTRAST  TECHNIQUE: Multidetector CT imaging of the chest was performed following the standard protocol without IV contrast.  COMPARISON:  Radiographs 08/13/2013 and 08/04/2013.  CT 08/02/2012.  FINDINGS: There are stable postsurgical changes related to prior left lower lobe resection. There has been interval enlargement of several mediastinal lymph nodes. These include 12 mm right paratracheal (image 19), 13 mm precarinal (image 23), and 14 mm AP window (image 25) lymph nodes. Some of these nodes have retained fatty hila. Allowing for the limitations of noncontrast technique, the hila appear stable.  There is stable low-density within the right thyroid lobe. Atherosclerosis of the aorta, great vessels and coronary arteries is noted. The heart size is normal. There is no pericardial effusion.  Moderate size dependent pleural effusions are present bilaterally. There is associated dependent airspace disease in both lower lobes. In addition, there are more focal airspace opacities within the right lower lobe and inferior aspect of the left upper lobe. The latter is somewhat nodular, measuring up to 1.8 cm on image 43. No other focal nodularity or endobronchial lesions are demonstrated.  The visualized upper abdomen has a stable appearance. There is no adrenal mass. There is increased subcutaneous edema throughout the subcutaneous fat, especially within the anterior aspect of the upper abdomen.  No worrisome osseous findings are demonstrated.  IMPRESSION: 1. As demonstrated radiographically, there are bilateral pleural  effusions and bilateral airspace opacities which are new compared with the prior CT. Although there are focal somewhat nodular components in the right lower and left upper lobes, these findings are most likely infectious/inflammatory. 2. Progressive mediastinal lymphadenopathy. Some of the nodes have retained fatty hila and may be reactive. Metastatic disease cannot be completely excluded. 3. Stable atherosclerosis and low-density within the right thyroid lobe. 4. If the airspace opacities and pleural effusions fail to respond to appropriate clinical therapy, follow-up CT or PET-CT may be warranted to exclude metastatic disease.   Electronically Signed   By: Camie Patience M.D.   On: 08/22/2013 10:26   Dg Chest Portable 1 View  08/13/2013   CLINICAL DATA:  Shortness of breath increasing for 2 weeks. History of CHF.  EXAM: PORTABLE CHEST - 1 VIEW  COMPARISON:  08/04/2013  FINDINGS: Again noted are bibasilar lung densities that are suggestive for pleural effusions and atelectasis. Heart size appears to be enlarged. There is some peribronchial thickening and cannot exclude pulmonary edema.  IMPRESSION: Bilateral pleural effusions with basilar atelectasis. There is mild pulmonary edema. Minimal change from the previous examination.   Electronically Signed   By: Markus Daft M.D.   On: 08/13/2013 23:10    ASSESSMENT AND PLAN 1. Cardiogenic shock 2. Acute on chronic systolic HF     EF ~16-24% 3. 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 Acute respiratory failure 5. 3-V CAD 6. Carotid stenosis     - doppler 08/31/2013 shows R 60-79% ICA stenosis, L 80-99% ICA stenosis     - vascular surgery aware 7. 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 DM1   She is critically ill today with worsening shock and profound tachycardia. Will take to cath lab for swan and probable IABP. She is not candidate for VAD or transplant given poorly  controlled DM, ESRD and size. Will review revascularization options with Dr. Prescott Gum. Prognosis is very guarded.  The patient is critically ill with multiple organ systems failure and requires high complexity decision making for assessment and support, frequent evaluation and titration of therapies, application of advanced monitoring technologies and extensive interpretation of multiple databases.   Critical Care Time devoted to patient care services described in this note is 35 Minutes.    Shaune Pascal Marquist Binstock,MD 12:37 PM

## 2013-09-13 NOTE — H&P (View-Only) (Signed)
ADVANCED HEART FAILURE ROUNDING NOTE   SUBJECTIVE  43 yo morbidly obese female with PMH of DM1, stage V kidney disease, blindness, h/o lung CA s/p resection who was recently discharged for heart failure symptom had a high risk stress test which showed ischemia in anterior and apical region. She underwent cath 09/04/2013 which showed chronically occluded RCA and triple vessel dx. CT surgery consulted. EF 22% by Myoview. 30-35% by echo  Stable overnight. Remains ST 130-140s and is going to OR today for perm catheter. CVVH will be transitioned to IHD after. Remains on levophed 5 mcg. Denies palpitations and SOB.      CURRENT MEDS . aspirin EC  81 mg Oral Daily  . atorvastatin  80 mg Oral q1800  . calcitRIOL  0.25 mcg Oral Daily  . darbepoetin (ARANESP) injection - NON-DIALYSIS  200 mcg Subcutaneous Q Wed-1800  . fentaNYL  25 mcg Intravenous Once  . fentaNYL  50 mcg Intravenous Once  . heparin  5,000 Units Subcutaneous 3 times per day  . insulin aspart  0-9 Units Subcutaneous TID WC  . insulin detemir  10 Units Subcutaneous q morning - 10a  . insulin detemir  32 Units Subcutaneous QHS  . lidocaine (PF)  5 mL Other Once  . metoprolol  2.5 mg Intravenous Once  . midazolam  2 mg Intravenous Once  . midazolam  2 mg Intravenous Once  . sodium chloride  10-40 mL Intracatheter Q12H  . sodium chloride  3 mL Intravenous Q12H    OBJECTIVE  Filed Vitals:   09/12/13 0845 09/12/13 0900 09/12/13 0915 09/12/13 0930  BP: 115/80 110/60 85/57 105/72  Pulse: 138 137 141 140  Temp:      TempSrc:      Resp: 16 16 18 20   Height:      Weight:      SpO2: 97% 94% 95% 98%    Intake/Output Summary (Last 24 hours) at 09/12/13 1000 Last data filed at 09/12/13 0900  Gross per 24 hour  Intake 1005.77 ml  Output   2857 ml  Net -1851.23 ml   Filed Weights   09/10/13 0545 09/11/13 0442 09/12/13 0600  Weight: 260 lb 9.3 oz (118.2 kg) 257 lb 15 oz (117 kg) 256 lb 6.3 oz (116.3 kg)    PHYSICAL  EXAM  General:Lying in bed; NAD Neuro: Alert and oriented X 3. Moves all extremities spontaneous HEENT:  Normal  Neck: Supple without bruits RIJ trialysis catheter Lungs:  clear Heart: tachy regular.  Abdomen: Soft, non-tender, non-distended, BS + x 4.  Extremities: No clubbing and cyanosis. DP/PT/Radials 2+ and equal bilaterally. tr pitting edema   CBC  Recent Labs  09/11/13 0430 09/12/13 0500  WBC 9.9 9.2  HGB 7.7* 8.1*  HCT 24.4* 25.6*  MCV 94.9 95.2  PLT 256 381   Basic Metabolic Panel  Recent Labs  09/11/13 0430 09/11/13 1548 09/12/13 0500  NA 137 133* 135*  K 4.5 5.3 4.4  CL 101 97 99  CO2 25 24 23   GLUCOSE 221* 356* 302*  BUN 14 16 16   CREATININE 1.81* 1.87* 1.87*  CALCIUM 7.9* 8.1* 8.1*  MG 2.4  --  2.4  PHOS 2.2* 2.6 2.6   Liver Function Tests  Recent Labs  09/11/13 0430 09/11/13 1548 09/12/13 0500  AST 14  --  19  ALT 15  --  17  ALKPHOS 61  --  72  BILITOT 0.3  --  0.3  PROT 5.4*  --  5.6*  ALBUMIN 2.1* 2.2* 2.2*   Cardiac Enzymes No results found for this basename: CKTOTAL, CKMB, CKMBINDEX, TROPONINI,  in the last 72 hours Thyroid Function Tests No results found for this basename: TSH, T4TOTAL, FREET3, T3FREE, THYROIDAB,  in the last 72 hours  TELE  Sinus tachycardia 120-130s ECG  Sinus tachycardia with HR 107 and poor R wave progression in anterior leads  Radiology/Studies  Dg Chest 2 View  09/06/2013   CLINICAL DATA:  CHF, intermittent dyspnea  EXAM: CHEST  2 VIEW  COMPARISON:  Prior chest x-ray 08/13/2013; prior chest CT 08/22/2013  FINDINGS: Stable cardiac and mediastinal contours. Atherosclerotic calcifications are present within the transverse aorta. Moderate left and small right pleural effusions are similar compared to prior. There is persistent associated bibasilar atelectasis. Mild pulmonary vascular congestion without overt edema. No pneumothorax. No new focal airspace consolidation. No acute osseous abnormality.   IMPRESSION: 1. Stable moderate left and small right pleural effusions and associated bibasilar atelectasis. 2. Pulmonary vascular congestion without overt edema.   Electronically Signed   By: Jacqulynn Cadet M.D.   On: 09/06/2013 07:55   Ct Chest Wo Contrast  08/22/2013   CLINICAL DATA:  Left lung cancer status post lobectomy. Weight gain. Renal insufficiency.  EXAM: CT CHEST WITHOUT CONTRAST  TECHNIQUE: Multidetector CT imaging of the chest was performed following the standard protocol without IV contrast.  COMPARISON:  Radiographs 08/13/2013 and 08/04/2013.  CT 08/02/2012.  FINDINGS: There are stable postsurgical changes related to prior left lower lobe resection. There has been interval enlargement of several mediastinal lymph nodes. These include 12 mm right paratracheal (image 19), 13 mm precarinal (image 23), and 14 mm AP window (image 25) lymph nodes. Some of these nodes have retained fatty hila. Allowing for the limitations of noncontrast technique, the hila appear stable.  There is stable low-density within the right thyroid lobe. Atherosclerosis of the aorta, great vessels and coronary arteries is noted. The heart size is normal. There is no pericardial effusion.  Moderate size dependent pleural effusions are present bilaterally. There is associated dependent airspace disease in both lower lobes. In addition, there are more focal airspace opacities within the right lower lobe and inferior aspect of the left upper lobe. The latter is somewhat nodular, measuring up to 1.8 cm on image 43. No other focal nodularity or endobronchial lesions are demonstrated.  The visualized upper abdomen has a stable appearance. There is no adrenal mass. There is increased subcutaneous edema throughout the subcutaneous fat, especially within the anterior aspect of the upper abdomen.  No worrisome osseous findings are demonstrated.  IMPRESSION: 1. As demonstrated radiographically, there are bilateral pleural effusions and  bilateral airspace opacities which are new compared with the prior CT. Although there are focal somewhat nodular components in the right lower and left upper lobes, these findings are most likely infectious/inflammatory. 2. Progressive mediastinal lymphadenopathy. Some of the nodes have retained fatty hila and may be reactive. Metastatic disease cannot be completely excluded. 3. Stable atherosclerosis and low-density within the right thyroid lobe. 4. If the airspace opacities and pleural effusions fail to respond to appropriate clinical therapy, follow-up CT or PET-CT may be warranted to exclude metastatic disease.   Electronically Signed   By: Camie Patience M.D.   On: 08/22/2013 10:26   Dg Chest Portable 1 View  08/13/2013   CLINICAL DATA:  Shortness of breath increasing for 2 weeks. History of CHF.  EXAM: PORTABLE CHEST - 1 VIEW  COMPARISON:  08/04/2013  FINDINGS: Again noted  are bibasilar lung densities that are suggestive for pleural effusions and atelectasis. Heart size appears to be enlarged. There is some peribronchial thickening and cannot exclude pulmonary edema.  IMPRESSION: Bilateral pleural effusions with basilar atelectasis. There is mild pulmonary edema. Minimal change from the previous examination.   Electronically Signed   By: Markus Daft M.D.   On: 08/13/2013 23:10    ASSESSMENT AND PLAN 1. Cardiogenic shock 2. Acute on chronic systolic HF     EF ~12-24% 3. 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 Acute respiratory failure 5. 3-V CAD 6. Carotid stenosis     - doppler 08/31/2013 shows R 60-79% ICA stenosis, L 80-99% ICA stenosis     - vascular surgery aware 7. 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 DM1   Stable overnight. No longer receiving metoprolol with levophed being restarted. HR remains 130-140s and appears to be ST. Will consult EP for recommendations.  She is going to the OR today for HD  perm cath placement and will transition to IHD after. May need central access once trialyisis catheter placed and we can place if needed.  Co-ox 62%, will continue milrinone 0.25. Will likely need swan to assess hemodynamics.   3V CAD continue statin and ASA. Will need to optimize before to CABG/CEA.  Rande Brunt, NP-C 10:00 AM   Patient seen and examined with Junie Bame, NP. We discussed all aspects of the encounter. I agree with the assessment and plan as stated above.   Volume status much improved. Down ~ 40 pounds with CVVHD. However she remains quite tachycardic. I have reviewed with Dr. Caryl Comes who also feels it is sinus tach. She has not been able to tolerate low-dose b-blocker and no result with digoxin. We will see if she improves once CVVHD is stopped and if we can wean levophed. May have to continue trial of ivabradine. For permcath today.   Shaune Pascal Bensimhon,MD 5:14 PM

## 2013-09-13 NOTE — Progress Notes (Signed)
Notified Dr. Donneta Romberg about Hannah [name]'s HR back up to high 130s-low 140s.   SBP 99-110s.  Hannah [name] asymptomatic, resting comfortably.  No new orders at this time.  Will continue to monitor.  Vista Lawman, RN

## 2013-09-13 NOTE — Progress Notes (Signed)
Subjective: Interval History: has no complaint, constip, tired.  Objective: Vital signs in last 24 hours: Temp:  [97.9 F (36.6 C)-99.5 F (37.5 C)] 99.1 F (37.3 C) (05/28 0755) Pulse Rate:  [67-159] 138 (05/28 0700) Resp:  [10-33] 19 (05/28 0700) BP: (67-143)/(37-90) 121/69 mmHg (05/28 0700) SpO2:  [91 %-100 %] 96 % (05/28 0700) Weight:  [119.7 kg (263 lb 14.3 oz)] 119.7 kg (263 lb 14.3 oz) (05/28 0500) Weight change: 3.4 kg (7 lb 7.9 oz)  Intake/Output from previous day: 05/27 0701 - 05/28 0700 In: 1206 [P.O.:480; I.V.:726] Out: 296  Intake/Output this shift:    General appearance: alert, cooperative, moderately obese and pale Resp: diminished breath sounds bilaterally and rales bibasilar Cardio: S1, S2 normal and rate 130s-140s GI: obese, pos bs, soft Extremities: edema 1+  Lab Results:  Recent Labs  09/12/13 0500 09/13/13 0428  WBC 9.2 7.1  HGB 8.1* 8.0*  HCT 25.6* 25.3*  PLT 280 222   BMET:  Recent Labs  09/12/13 1600 09/13/13 0428  NA 132* 133*  K 4.7 5.1  CL 96 97  CO2 23 21  GLUCOSE 271* 391*  BUN 19 29*  CREATININE 2.58* 3.50*  CALCIUM 8.0* 8.3*   No results found for this basename: PTH,  in the last 72 hours Iron Studies: No results found for this basename: IRON, TIBC, TRANSFERRIN, FERRITIN,  in the last 72 hours  Studies/Results: Dg Chest Port 1 View  09/12/2013   CLINICAL DATA:  Dialysis catheter placement.  EXAM: PORTABLE CHEST - 1 VIEW  COMPARISON:  09/10/2013  FINDINGS: There has been interval placement of a left jugular dual-lumen central venous catheter with tips overlying the lower SVC. Right jugular central venous catheter remains in place with tip overlying the mid to lower SVC. The cardiac silhouette remains enlarged, unchanged. Pulmonary edema does not appear significantly changed. Veiling opacities in both lung bases are again seen, compatible with small bilateral pleural effusions. The patient has taken a greater inspiration, and  there is mildly improved aeration of the lung bases. No pneumothorax is identified.  IMPRESSION: 1. Interval dialysis catheter placement as above. 2. Unchanged pulmonary edema and small bilateral pleural effusions. 3. Bibasilar atelectasis, improved from prior.   Electronically Signed   By: Logan Bores   On: 09/12/2013 13:11   Dg Fluoro Guide Cv Line-no Report  09/12/2013   CLINICAL DATA: dialysis catheter insertion   FLOURO GUIDE CV LINE  Fluoroscopy was utilized by the requesting physician.  No radiographic  interpretation.     I have reviewed the patient's current medications.  Assessment/Plan: 1 ESRD for HD, some vol xs but cannot take a lot with HR and soft BP 2 CAD  Per CVS/Cards 3 Carotid dz 4 Constip tx 5 Anemia epo, fe 6 DM  7 HR  ??reentrant 8 ^ Lipid P HD, epo, Miralax ,      LOS: 10 days   Hannah [name] [name] 09/13/2013,8:15 AM

## 2013-09-13 NOTE — Plan of Care (Signed)
Problem: Phase II Progression Outcomes Goal: Dialysis initiated Outcome: Completed/Met Date Met:  09/13/13 HD attempted today. Treatment stopped due to catheter malfunction.

## 2013-09-14 ENCOUNTER — Ambulatory Visit: Payer: Medicare Other | Admitting: Endocrinology

## 2013-09-14 ENCOUNTER — Encounter: Payer: Self-pay | Admitting: Surgery

## 2013-09-14 ENCOUNTER — Inpatient Hospital Stay (HOSPITAL_COMMUNITY): Payer: Medicare Other

## 2013-09-14 DIAGNOSIS — Z0289 Encounter for other administrative examinations: Secondary | ICD-10-CM

## 2013-09-14 LAB — GLUCOSE, CAPILLARY
GLUCOSE-CAPILLARY: 159 mg/dL — AB (ref 70–99)
GLUCOSE-CAPILLARY: 104 mg/dL — AB (ref 70–99)
GLUCOSE-CAPILLARY: 195 mg/dL — AB (ref 70–99)
Glucose-Capillary: 205 mg/dL — ABNORMAL HIGH (ref 70–99)

## 2013-09-14 LAB — CARBOXYHEMOGLOBIN
CARBOXYHEMOGLOBIN: 2.5 % — AB (ref 0.5–1.5)
Carboxyhemoglobin: 2 % — ABNORMAL HIGH (ref 0.5–1.5)
Carboxyhemoglobin: 1.9 % — ABNORMAL HIGH (ref 0.5–1.5)
Carboxyhemoglobin: 2.1 % — ABNORMAL HIGH (ref 0.5–1.5)
Methemoglobin: 0.9 % (ref 0.0–1.5)
Methemoglobin: 0.9 % (ref 0.0–1.5)
Methemoglobin: 0.4 % (ref 0.0–1.5)
Methemoglobin: 0.8 % (ref 0.0–1.5)
O2 Saturation: 60.9 %
O2 Saturation: 44.2 %
O2 Saturation: 57.3 %
O2 Saturation: 94.3 %
Total hemoglobin: 7.9 g/dL — ABNORMAL LOW (ref 12.0–16.0)
Total hemoglobin: 8.3 g/dL — ABNORMAL LOW (ref 12.0–16.0)
Total hemoglobin: 7.8 g/dL — ABNORMAL LOW (ref 12.0–16.0)
Total hemoglobin: 8.1 g/dL — ABNORMAL LOW (ref 12.0–16.0)

## 2013-09-14 LAB — RENAL FUNCTION PANEL
Albumin: 2.1 g/dL — ABNORMAL LOW (ref 3.5–5.2)
Albumin: 2.1 g/dL — ABNORMAL LOW (ref 3.5–5.2)
BUN: 28 mg/dL — AB (ref 6–23)
BUN: 16 mg/dL (ref 6–23)
CO2: 22 meq/L (ref 19–32)
CO2: 28 meq/L (ref 19–32)
CREATININE: 3.53 mg/dL — AB (ref 0.50–1.10)
Calcium: 7.9 mg/dL — ABNORMAL LOW (ref 8.4–10.5)
Calcium: 7.4 mg/dL — ABNORMAL LOW (ref 8.4–10.5)
Chloride: 99 mEq/L (ref 96–112)
Chloride: 99 mEq/L (ref 96–112)
Creatinine, Ser: 2.15 mg/dL — ABNORMAL HIGH (ref 0.50–1.10)
GFR calc Af Amer: 17 mL/min — ABNORMAL LOW (ref >90)
GFR calc non Af Amer: 15 mL/min — ABNORMAL LOW (ref >90)
GFR calc non Af Amer: 27 mL/min — ABNORMAL LOW (ref >90)
GFR, EST AFRICAN AMERICAN: 31 mL/min — AB (ref >90)
Glucose, Bld: 243 mg/dL — ABNORMAL HIGH (ref 70–99)
Glucose, Bld: 121 mg/dL — ABNORMAL HIGH (ref 70–99)
Phosphorus: 4.3 mg/dL (ref 2.3–4.6)
Phosphorus: 2.8 mg/dL (ref 2.3–4.6)
Potassium: 4.8 mEq/L (ref 3.7–5.3)
Potassium: 3.2 mEq/L — ABNORMAL LOW (ref 3.7–5.3)
Sodium: 134 mEq/L — ABNORMAL LOW (ref 137–147)
Sodium: 139 mEq/L (ref 137–147)

## 2013-09-14 LAB — CBC
HCT: 24.7 % — ABNORMAL LOW (ref 36.0–46.0)
HCT: 27.1 % — ABNORMAL LOW (ref 36.0–46.0)
HEMOGLOBIN: 7.5 g/dL — AB (ref 12.0–15.0)
HEMOGLOBIN: 8.4 g/dL — AB (ref 12.0–15.0)
MCH: 29.8 pg (ref 26.0–34.0)
MCH: 29.6 pg (ref 26.0–34.0)
MCHC: 30.4 g/dL (ref 30.0–36.0)
MCHC: 31 g/dL (ref 30.0–36.0)
MCV: 98 fL (ref 78.0–100.0)
MCV: 95.4 fL (ref 78.0–100.0)
PLATELETS: 189 10*3/uL (ref 150–400)
Platelets: 101 10*3/uL — ABNORMAL LOW (ref 150–400)
RBC: 2.52 MIL/uL — ABNORMAL LOW (ref 3.87–5.11)
RBC: 2.84 MIL/uL — AB (ref 3.87–5.11)
RDW: 16.5 % — AB (ref 11.5–15.5)
RDW: 16.6 % — ABNORMAL HIGH (ref 11.5–15.5)
WBC: 6.5 10*3/uL (ref 4.0–10.5)
WBC: 6.7 10*3/uL (ref 4.0–10.5)

## 2013-09-14 LAB — PREPARE RBC (CROSSMATCH)

## 2013-09-14 LAB — POCT ACTIVATED CLOTTING TIME: ACTIVATED CLOTTING TIME: 171 s

## 2013-09-14 LAB — HEPARIN LEVEL (UNFRACTIONATED)
HEPARIN UNFRACTIONATED: 1.26 [IU]/mL — AB (ref 0.30–0.70)
Heparin Unfractionated: 0.25 IU/mL — ABNORMAL LOW (ref 0.30–0.70)

## 2013-09-14 LAB — APTT: APTT: 71 s — AB (ref 24–37)

## 2013-09-14 LAB — MAGNESIUM
MAGNESIUM: 2.3 mg/dL (ref 1.5–2.5)
Magnesium: 2 mg/dL (ref 1.5–2.5)

## 2013-09-14 MED ORDER — NEPRO/CARBSTEADY PO LIQD: 237.0000 mL | ORAL | Status: DC | PRN

## 2013-09-14 MED ORDER — LIDOCAINE-PRILOCAINE 2.5-2.5 % EX CREA
1.0000 "application " | TOPICAL_CREAM | CUTANEOUS | Status: DC | PRN
  Filled 2013-09-14: qty 5

## 2013-09-14 MED ORDER — SODIUM CHLORIDE 0.9 % IV SOLN: 100.0000 mL | INTRAVENOUS | Status: DC | PRN

## 2013-09-14 MED ORDER — ALTEPLASE 2 MG IJ SOLR
2.0000 mg | Freq: Once | INTRAMUSCULAR | Status: AC | PRN
  Filled 2013-09-14: qty 2

## 2013-09-14 MED ORDER — AMIODARONE HCL IN DEXTROSE 360-4.14 MG/200ML-% IV SOLN
60.0000 mg/h | INTRAVENOUS | Status: AC
  Administered 2013-09-14: 60 mg/h via INTRAVENOUS
  Filled 2013-09-14 (×2): qty 200

## 2013-09-14 MED ORDER — HEPARIN SODIUM (PORCINE) 1000 UNIT/ML DIALYSIS: 1000.0000 [IU] | INTRAMUSCULAR | Status: DC | PRN

## 2013-09-14 MED ORDER — HEPARIN SODIUM (PORCINE) 1000 UNIT/ML DIALYSIS: 40.0000 [IU]/kg | Freq: Once | INTRAMUSCULAR | Status: DC

## 2013-09-14 MED ORDER — AMIODARONE LOAD VIA INFUSION
150.0000 mg | Freq: Once | INTRAVENOUS | Status: AC
  Administered 2013-09-14: 150 mg via INTRAVENOUS
  Filled 2013-09-14: qty 83.34

## 2013-09-14 MED ORDER — PENTAFLUOROPROP-TETRAFLUOROETH EX AERO: 1.0000 "application " | INHALATION_SPRAY | CUTANEOUS | Status: DC | PRN

## 2013-09-14 MED ORDER — HEPARIN SODIUM (PORCINE) 1000 UNIT/ML DIALYSIS
40.0000 [IU]/kg | Freq: Once | INTRAMUSCULAR | Status: DC
  Filled 2013-09-14: qty 5

## 2013-09-14 MED ORDER — LIDOCAINE-PRILOCAINE 2.5-2.5 % EX CREA: 1.0000 "application " | TOPICAL_CREAM | CUTANEOUS | Status: DC | PRN

## 2013-09-14 MED ORDER — LIDOCAINE HCL (PF) 1 % IJ SOLN: 5.0000 mL | INTRAMUSCULAR | Status: DC | PRN

## 2013-09-14 MED ORDER — AMIODARONE HCL IN DEXTROSE 360-4.14 MG/200ML-% IV SOLN
30.0000 mg/h | INTRAVENOUS | Status: DC
  Administered 2013-09-15 – 2013-09-24 (×15): 30 mg/h via INTRAVENOUS
  Filled 2013-09-14 (×42): qty 200

## 2013-09-14 NOTE — Progress Notes (Signed)
  Patient with IABP in R groin. Lost pulses in R foot and big toe was progressively dusky. Decision made to d/c IABP. Cath lab pulled pump. R foot looks better without ischemic changes but pulses still not dopplerable. Will continue to follow.   I reviewed cath films with Dr. Burt Knack and we agree that she does not have good targets for PCI and PCI unlikely to improve cardiac function. Medical therapy only option at this point.  Now that IABP out will follow co-ox and support cardiac output with inotropes as needed. Tachycardia improved on amio.   Prognosis is increasingly concerning. She will have a difficult time tolerating HD.   Additional 45 mins CCT  Shaune Pascal Bensimhon,MD 5:41 PM

## 2013-09-14 NOTE — Progress Notes (Signed)
Act result 171. IABP present in right femoral artery. Some ecchymosis  Present prior to removal. IABP aspirated and removed from rfa, manual pressure applied for 30 minutes. BP104/58.  Hemostasis achieved. No s+s of hematoma, tegaderm dressing applied. Attempted doppler pulses, unable to hear distal pulses with doppler. Rt foot slightly pale but appears less pale after removal. Right big toe still slightly blue after removal. Bedrest instructions given.

## 2013-09-14 NOTE — Progress Notes (Signed)
Subjective: Interval History: has complaints tired.  Objective: Vital signs in last 24 hours: Temp:  [97.9 F (36.6 C)-99.9 F (37.7 C)] 99.7 F (37.6 C) (05/29 0800) Pulse Rate:  [34-279] 138 (05/29 0800) Resp:  [12-22] 22 (05/29 0800) BP: (86-137)/(41-93) 107/78 mmHg (05/29 0800) SpO2:  [94 %-100 %] 100 % (05/29 0800) Weight:  [116.3 kg (256 lb 6.3 oz)-121 kg (266 lb 12.1 oz)] 120.9 kg (266 lb 8.6 oz) (05/29 0500) Weight change: -3.4 kg (-7 lb 7.9 oz)  Intake/Output from previous day: 05/28 0701 - 05/29 0700 In: 713.6 [P.O.:60; I.V.:653.6] Out: 895 [Urine:150] Intake/Output this shift: Total I/O In: 89.4 [P.O.:60; I.V.:29.4] Out: -   General appearance: alert, cooperative, moderately obese and pale Resp: diminished breath sounds bilaterally and rhonchi bilaterally Cardio: S1, S2 normal and rate 130s to 140s GI: obese,pos bs,soft, liver down 6 cm Extremities: edema 2+.  and balloon L groin  Lab Results:  Recent Labs  09/13/13 0428 09/14/13 0500  WBC 7.1 6.5  HGB 8.0* 7.5*  HCT 25.3* 24.7*  PLT 222 189   BMET:  Recent Labs  09/13/13 1600 09/14/13 0500  NA 130* 134*  K 4.8 4.8  CL 96 99  CO2 22 22  GLUCOSE 309* 243*  BUN 34* 28*  CREATININE 4.11* 3.53*  CALCIUM 8.1* 7.9*   No results found for this basename: PTH,  in the last 72 hours Iron Studies: No results found for this basename: IRON, TIBC, TRANSFERRIN, FERRITIN,  in the last 72 hours  Studies/Results: Dg Chest Port 1 View  09/12/2013   CLINICAL DATA:  Dialysis catheter placement.  EXAM: PORTABLE CHEST - 1 VIEW  COMPARISON:  09/10/2013  FINDINGS: There has been interval placement of a left jugular dual-lumen central venous catheter with tips overlying the lower SVC. Right jugular central venous catheter remains in place with tip overlying the mid to lower SVC. The cardiac silhouette remains enlarged, unchanged. Pulmonary edema does not appear significantly changed. Veiling opacities in both lung bases  are again seen, compatible with small bilateral pleural effusions. The patient has taken a greater inspiration, and there is mildly improved aeration of the lung bases. No pneumothorax is identified.  IMPRESSION: 1. Interval dialysis catheter placement as above. 2. Unchanged pulmonary edema and small bilateral pleural effusions. 3. Bibasilar atelectasis, improved from prior.   Electronically Signed   By: Logan Bores   On: 09/12/2013 13:11   Dg Fluoro Guide Cv Line-no Report  09/12/2013   CLINICAL DATA: dialysis catheter insertion   FLOURO GUIDE CV LINE  Fluoroscopy was utilized by the requesting physician.  No radiographic  interpretation.     I have reviewed the patient's current medications.  Assessment/Plan: 1 CKD5  Shortened tx due to cath malfct yest.  Will do short tx today and full tx tomorrow., Acid/base/k ok. Vol xs but bp limits. 2 Card still tachy.  ??plan 3 Anemia drifting down may need tx soon  4 DM controlled 5 PVD 6 Cerebrovasc dz P HD 2d in a row, meds. Epo, balloon pump    LOS: 11 days   Tatiyanna Lashley L Avabella Wailes 09/14/2013,8:27 AM

## 2013-09-14 NOTE — Progress Notes (Signed)
ANTICOAGULATION CONSULT NOTE - Initial Consult  Pharmacy Consult for heparin  Indication: IABP  Allergies  Allergen Reactions  . Crestor [Rosuvastatin] Other (See Comments)    Severe muscle weakness  . Nsaids Other (See Comments)    Not allergic, "bad on my kidneys"  . Ciprofloxacin Rash    Patient Measurements: Height: 5' 7.5" (171.5 cm) Weight: 270 lb 1 oz (122.5 kg) IBW/kg (Calculated) : 62.75 Heparin Dosing Weight: 91kg  Vital Signs: Temp: 98.6 F (37 C) (05/29 1315) Temp src: Core (Comment) (05/29 1230) BP: 99/62 mmHg (05/29 1315) Pulse Rate: 126 (05/29 1315)  Labs:  Recent Labs  09/12/13 0500  09/13/13 0428 09/13/13 1600 09/13/13 2219 09/14/13 0500  HGB 8.1*  --  8.0*  --   --  7.5*  HCT 25.6*  --  25.3*  --   --  24.7*  PLT 280  --  222  --   --  189  APTT >200*  --  29  --   --  71*  HEPARINUNFRC  --   --   --   --  0.62 0.25*  CREATININE 1.87*  < > 3.50* 4.11*  --  3.53*  < > = values in this interval not displayed.  Estimated Creatinine Clearance: 27.5 ml/min (by C-G formula based on Cr of 3.53).   Medical History: Past Medical History  Diagnosis Date  . High cholesterol   . Diabetic retinopathy   . Peripheral neuropathy     "tips of toes"  . Blind right eye   . CHF (congestive heart failure)   . CAD (coronary artery disease)   . GERD (gastroesophageal reflux disease)   . Hypertension   . History of lung cancer 07/2011    s/p left lower lobectomy  . Diabetes mellitus     IDDM  . Cataract of right eye   . Ulcer of toe of right foot 07/10/2012    great toe  . Breast calcification, left 06/2012  . Acute biphenotypic leukemia   . CKD (chronic kidney disease) stage 5, GFR less than 15 ml/min   . Nephrotic syndrome   . Gastritis     H/o gastritis on prior endoscopy  . Anemia   . Carotid artery disease    Assessment: 43 year old female awaiting potential open heart surgery, now with low output heart failure requiring invasive cardiac  monitoring and hemodynamic support with IABP placed in cath lab 5/28.   Patient continues on heparin for IABP. No complications noted with pump or heparin. Level this morning after adjustments were at goal rechecking level now to confirm.  Goal of Therapy:  Heparin level 0.2-0.5 units/ml Monitor platelets by anticoagulation protocol: Yes   Plan:  Heparin infusion at 1500 units/hr Continue to monitor H&H and platelets  Erin Hearing PharmD., BCPS Clinical Pharmacist Pager [phone] 09/14/2013 1:33 PM

## 2013-09-14 NOTE — Progress Notes (Signed)
Dr. Haroldine Laws called regarding absence of patient's R foot pulses.  Patient does not notice any change of sensation of R lower extremity and foot.  R foot appears slightly cooler than left.  Capillary refill unchanged.  No new orders received.  Will continue to assess.  Doran Clay, RN

## 2013-09-14 NOTE — Progress Notes (Addendum)
ADVANCED HEART FAILURE ROUNDING NOTE   SUBJECTIVE  43 yo morbidly obese female with PMH of DM1, stage V kidney disease, blindness, h/o lung CA s/p resection who was recently discharged for heart failure symptom had a high risk stress test which showed ischemia in anterior and apical region. She underwent cath 09/04/2013 which showed chronically occluded RCA and triple vessel dx. CT surgery consulted. EF 22% by Myoview. 30-35% by echo  Underwent placement of swan and IABP yesterday for shock (co-ox 38%). Breathing a little better. Back hurts. Remains tachycardic with HRs in 130s. Struggling with clotting of perm cath.  CVP 13 PA 45/32 PCWP 23 Co-ox 61%    CURRENT MEDS . aspirin EC  81 mg Oral Daily  . atorvastatin  80 mg Oral q1800  . calcitRIOL  0.25 mcg Oral Daily  . darbepoetin (ARANESP) injection - NON-DIALYSIS  200 mcg Subcutaneous Q Wed-1800  . fentaNYL  25 mcg Intravenous Once  . fentaNYL  50 mcg Intravenous Once  . insulin aspart  0-9 Units Subcutaneous TID WC  . insulin aspart  3 Units Subcutaneous TID WC  . insulin detemir  10 Units Subcutaneous q morning - 10a  . insulin detemir  34 Units Subcutaneous QHS  . lidocaine (PF)  5 mL Other Once  . metoprolol  2.5 mg Intravenous Once  . midazolam  2 mg Intravenous Once  . midazolam  2 mg Intravenous Once  . sodium chloride  10-40 mL Intracatheter Q12H  . sodium chloride  3 mL Intravenous Q12H  . sodium chloride  3 mL Intravenous Q12H    OBJECTIVE  Filed Vitals:   09/14/13 0930 09/14/13 0945 09/14/13 1000 09/14/13 1015  BP:      Pulse: 136 135 67 132  Temp: 99.3 F (37.4 C) 99.3 F (37.4 C) 99.3 F (37.4 C) 99.1 F (37.3 C)  TempSrc:      Resp: 18 18 20 19   Height:      Weight:      SpO2: 98% 99% 100% 98%    Intake/Output Summary (Last 24 hours) at 09/14/13 1019 Last data filed at 09/14/13 0900  Gross per 24 hour  Intake 775.65 ml  Output    895 ml  Net -119.35 ml   Filed Weights   09/13/13 1650  09/13/13 1914 09/14/13 0500  Weight: 116.3 kg (256 lb 6.3 oz) 121 kg (266 lb 12.1 oz) 120.9 kg (266 lb 8.6 oz)    PHYSICAL EXAM  General:Lying in bed; weak. Neuro: Alert and oriented X 3. Moves all extremities spontaneous HEENT:  Normal  Neck: Supple without bruits. Chest L subclav PC. R [place] swan Lungs:  clear Heart: tachy regular. +s3 Abdomen: Soft, non-tender, non-distended, BS + x 4.  Extremities: No clubbing and cyanosis. DP/PT/Radials 2+ and equal bilaterally. 1+ pitting edema. R groin IABP   CBC  Recent Labs  09/13/13 0428 09/14/13 0500  WBC 7.1 6.5  HGB 8.0* 7.5*  HCT 25.3* 24.7*  MCV 96.2 98.0  PLT 222 606   Basic Metabolic Panel  Recent Labs  09/13/13 0428 09/13/13 1600 09/14/13 0500  NA 133* 130* 134*  K 5.1 4.8 4.8  CL 97 96 99  CO2 21 22 22   GLUCOSE 391* 309* 243*  BUN 29* 34* 28*  CREATININE 3.50* 4.11* 3.53*  CALCIUM 8.3* 8.1* 7.9*  MG 2.5  --  2.3  PHOS 4.9* 5.4* 4.3   Liver Function Tests  Recent Labs  09/12/13 0500  09/13/13 0428 09/13/13 1600 09/14/13  0500  AST 19  --  12  --   --   ALT 17  --  14  --   --   ALKPHOS 72  --  66  --   --   BILITOT 0.3  --  0.2*  --   --   PROT 5.6*  --  5.5*  --   --   ALBUMIN 2.2*  < > 2.3* 2.1* 2.1*  < > = values in this interval not displayed. Cardiac Enzymes No results found for this basename: CKTOTAL, CKMB, CKMBINDEX, TROPONINI,  in the last 72 hours Thyroid Function Tests No results found for this basename: TSH, T4TOTAL, FREET3, T3FREE, THYROIDAB,  in the last 72 hours  TELE  Sinus tachycardia 120-130s ECG  Sinus tachycardia with HR 107 and poor R wave progression in anterior leads  Radiology/Studies  Dg Chest 2 View  09/06/2013   CLINICAL DATA:  CHF, intermittent dyspnea  EXAM: CHEST  2 VIEW  COMPARISON:  Prior chest x-ray 08/13/2013; prior chest CT 08/22/2013  FINDINGS: Stable cardiac and mediastinal contours. Atherosclerotic calcifications are present within the transverse aorta.  Moderate left and small right pleural effusions are similar compared to prior. There is persistent associated bibasilar atelectasis. Mild pulmonary vascular congestion without overt edema. No pneumothorax. No new focal airspace consolidation. No acute osseous abnormality.  IMPRESSION: 1. Stable moderate left and small right pleural effusions and associated bibasilar atelectasis. 2. Pulmonary vascular congestion without overt edema.   Electronically Signed   By: Jacqulynn Cadet M.D.   On: 09/06/2013 07:55   Ct Chest Wo Contrast  08/22/2013   CLINICAL DATA:  Left lung cancer status post lobectomy. Weight gain. Renal insufficiency.  EXAM: CT CHEST WITHOUT CONTRAST  TECHNIQUE: Multidetector CT imaging of the chest was performed following the standard protocol without IV contrast.  COMPARISON:  Radiographs 08/13/2013 and 08/04/2013.  CT 08/02/2012.  FINDINGS: There are stable postsurgical changes related to prior left lower lobe resection. There has been interval enlargement of several mediastinal lymph nodes. These include 12 mm right paratracheal (image 19), 13 mm precarinal (image 23), and 14 mm AP window (image 25) lymph nodes. Some of these nodes have retained fatty hila. Allowing for the limitations of noncontrast technique, the hila appear stable.  There is stable low-density within the right thyroid lobe. Atherosclerosis of the aorta, great vessels and coronary arteries is noted. The heart size is normal. There is no pericardial effusion.  Moderate size dependent pleural effusions are present bilaterally. There is associated dependent airspace disease in both lower lobes. In addition, there are more focal airspace opacities within the right lower lobe and inferior aspect of the left upper lobe. The latter is somewhat nodular, measuring up to 1.8 cm on image 43. No other focal nodularity or endobronchial lesions are demonstrated.  The visualized upper abdomen has a stable appearance. There is no adrenal mass.  There is increased subcutaneous edema throughout the subcutaneous fat, especially within the anterior aspect of the upper abdomen.  No worrisome osseous findings are demonstrated.  IMPRESSION: 1. As demonstrated radiographically, there are bilateral pleural effusions and bilateral airspace opacities which are new compared with the prior CT. Although there are focal somewhat nodular components in the right lower and left upper lobes, these findings are most likely infectious/inflammatory. 2. Progressive mediastinal lymphadenopathy. Some of the nodes have retained fatty hila and may be reactive. Metastatic disease cannot be completely excluded. 3. Stable atherosclerosis and low-density within the right thyroid lobe. 4. If  the airspace opacities and pleural effusions fail to respond to appropriate clinical therapy, follow-up CT or PET-CT may be warranted to exclude metastatic disease.   Electronically Signed   By: Camie Patience M.D.   On: 08/22/2013 10:26   Dg Chest Portable 1 View  08/13/2013   CLINICAL DATA:  Shortness of breath increasing for 2 weeks. History of CHF.  EXAM: PORTABLE CHEST - 1 VIEW  COMPARISON:  08/04/2013  FINDINGS: Again noted are bibasilar lung densities that are suggestive for pleural effusions and atelectasis. Heart size appears to be enlarged. There is some peribronchial thickening and cannot exclude pulmonary edema.  IMPRESSION: Bilateral pleural effusions with basilar atelectasis. There is mild pulmonary edema. Minimal change from the previous examination.   Electronically Signed   By: Markus Daft M.D.   On: 08/13/2013 23:10    ASSESSMENT AND PLAN 1. Cardiogenic shock 2. Acute on chronic systolic HF     EF ~47-09% 3. 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 Acute respiratory failure 5. 3-V CAD 6. Carotid stenosis     - doppler 08/31/2013 shows R 60-79% ICA stenosis, L 80-99% ICA stenosis     - vascular surgery aware 7. Lung CA  h/o LLL lobectomy (2 yr ago due to non-small cell carcinoma)      - PFT  09/05/2013 pre-FEV1 28%, pre-FVC 34%, pre-FEV1/FVC ratio 68 8. DM1   Cardiac output improved with IABP though she remains very tachycardic. I discussed with Dr. Caryl Comes. Will plan to start amio for rate control. Discussed with DR. Prescott Gum and not candidate for CABG due to poor PFTs and shock. Will review cath films with interventional team regarding PCI of LAD. Volume management per Renal. Continue low dose milrinone. Will give 1u RBCs today.   The patient is critically ill with multiple organ systems failure and requires high complexity decision making for assessment and support, frequent evaluation and titration of therapies, application of advanced monitoring technologies and extensive interpretation of multiple databases.   Critical Care Time devoted to patient care services described in this note is 45 Minutes.  Shaune Pascal Bensimhon,MD 10:19 AM

## 2013-09-14 NOTE — Progress Notes (Signed)
CRITICAL VALUE ALERT  Critical value received:  Co ox 44  Date of notification:  09/14/13  Time of notification:  1900  Critical value read back:yes  Nurse who received alert:  Carver Fila  MD notified (1st page):  Dr. Haroldine Laws  Time of first page:  1715  MD notified (2nd page):  Time of second page:  Responding MD:  n/a  Time MD responded:  n/a

## 2013-09-15 DIAGNOSIS — M79609 Pain in unspecified limb: Secondary | ICD-10-CM

## 2013-09-15 LAB — GLUCOSE, CAPILLARY
GLUCOSE-CAPILLARY: 180 mg/dL — AB (ref 70–99)
GLUCOSE-CAPILLARY: 171 mg/dL — AB (ref 70–99)
GLUCOSE-CAPILLARY: 111 mg/dL — AB (ref 70–99)
Glucose-Capillary: 201 mg/dL — ABNORMAL HIGH (ref 70–99)
Glucose-Capillary: 167 mg/dL — ABNORMAL HIGH (ref 70–99)

## 2013-09-15 LAB — RENAL FUNCTION PANEL
Albumin: 2 g/dL — ABNORMAL LOW (ref 3.5–5.2)
Albumin: 2 g/dL — ABNORMAL LOW (ref 3.5–5.2)
Albumin: 1.8 g/dL — ABNORMAL LOW (ref 3.5–5.2)
BUN: 24 mg/dL — AB (ref 6–23)
BUN: 3 mg/dL — AB (ref 6–23)
BUN: 13 mg/dL (ref 6–23)
CALCIUM: 6.8 mg/dL — AB (ref 8.4–10.5)
CHLORIDE: 99 meq/L (ref 96–112)
CO2: 26 mEq/L (ref 19–32)
CO2: 32 mEq/L (ref 19–32)
CO2: 29 mEq/L (ref 19–32)
CREATININE: 2.3 mg/dL — AB (ref 0.50–1.10)
Calcium: 7.6 mg/dL — ABNORMAL LOW (ref 8.4–10.5)
Calcium: 6.9 mg/dL — ABNORMAL LOW (ref 8.4–10.5)
Chloride: 97 mEq/L (ref 96–112)
Chloride: 100 mEq/L (ref 96–112)
Creatinine, Ser: 3.5 mg/dL — ABNORMAL HIGH (ref 0.50–1.10)
Creatinine, Ser: 0.63 mg/dL (ref 0.50–1.10)
GFR calc Af Amer: 17 mL/min — ABNORMAL LOW (ref >90)
GFR calc Af Amer: >90 mL/min (ref >90)
GFR calc Af Amer: 28 mL/min — ABNORMAL LOW (ref >90)
GFR calc non Af Amer: 15 mL/min — ABNORMAL LOW (ref >90)
GFR calc non Af Amer: >90 mL/min (ref >90)
GFR calc non Af Amer: 24 mL/min — ABNORMAL LOW (ref >90)
GLUCOSE: 215 mg/dL — AB (ref 70–99)
GLUCOSE: 113 mg/dL — AB (ref 70–99)
GLUCOSE: 214 mg/dL — AB (ref 70–99)
PHOSPHORUS: 0.6 mg/dL — AB (ref 2.3–4.6)
POTASSIUM: 4.1 meq/L (ref 3.7–5.3)
POTASSIUM: 2.2 meq/L — AB (ref 3.7–5.3)
Phosphorus: 4.5 mg/dL (ref 2.3–4.6)
Phosphorus: 2.7 mg/dL (ref 2.3–4.6)
Potassium: 4 mEq/L (ref 3.7–5.3)
Sodium: 136 mEq/L — ABNORMAL LOW (ref 137–147)
Sodium: 136 mEq/L — ABNORMAL LOW (ref 137–147)
Sodium: 137 mEq/L (ref 137–147)

## 2013-09-15 LAB — CARBOXYHEMOGLOBIN
Carboxyhemoglobin: 2.1 % — ABNORMAL HIGH (ref 0.5–1.5)
Methemoglobin: 0.9 % (ref 0.0–1.5)
O2 SAT: 62.3 %
Total hemoglobin: 8.3 g/dL — ABNORMAL LOW (ref 12.0–16.0)

## 2013-09-15 LAB — CBC
HEMATOCRIT: 25 % — AB (ref 36.0–46.0)
HEMOGLOBIN: 8.1 g/dL — AB (ref 12.0–15.0)
MCH: 31 pg (ref 26.0–34.0)
MCHC: 32.4 g/dL (ref 30.0–36.0)
MCV: 95.8 fL (ref 78.0–100.0)
PLATELETS: 100 10*3/uL — AB (ref 150–400)
RBC: 2.61 MIL/uL — ABNORMAL LOW (ref 3.87–5.11)
RDW: 17.7 % — ABNORMAL HIGH (ref 11.5–15.5)
WBC: 6.7 10*3/uL (ref 4.0–10.5)

## 2013-09-15 LAB — MAGNESIUM: Magnesium: 2.1 mg/dL (ref 1.5–2.5)

## 2013-09-15 LAB — APTT: aPTT: 35 seconds (ref 24–37)

## 2013-09-15 MED ORDER — ALTEPLASE 100 MG IV SOLR
5.0000 mg | Freq: Once | INTRAVENOUS | Status: AC
  Administered 2013-09-15: 5 mg
  Filled 2013-09-15: qty 5

## 2013-09-15 MED ORDER — SODIUM CHLORIDE 0.9 % IV SOLN: 5.0000 mg | Freq: Once | INTRAVENOUS | Status: DC

## 2013-09-15 NOTE — Procedures (Signed)
I was present at this session.  I have reviewed the session itself and made appropriate changes.  TPA'd. Flows up to 305 . BP tol hd thus far.    Joyice Faster Carole Deere 5/30/20154:43 PM

## 2013-09-15 NOTE — Progress Notes (Signed)
*  PRELIMINARY RESULTS* Vascular Ultrasound Lower Extremity Arterial Duplex has been completed.  Preliminary findings:  Right = Technically difficult, but appears to be an area of the common femoral artery that is occluded, no Color Doppler signal or Pulsed Doppler signal at this area. From the level of the distal common femoral down to pedal arteries (PT, AT) , there is severely dampened monophasic flow. Left common femoral artery is patent with biphasic flow.   Landry Mellow, RDMS, RVT  09/15/2013, 12:16 PM

## 2013-09-15 NOTE — Progress Notes (Signed)
CRITICAL VALUE ALERT  Critical value received:  K 2.2 and Phos .6  Date of notification:  09/15/13  Time of notification:  5329  Critical value read back:yes  Nurse who received alert:  Carver Fila  MD notified (1st page):  N/a Repeat Renal panel ordered once patient is done with HD  Time of first page:  n/a  MD notified (2nd page):  Time of second page:  Responding MD:  n/a  Time MD responded:  n/a

## 2013-09-15 NOTE — Progress Notes (Addendum)
Patient ID: TASHYRA ADDUCI, female   DOB: 07/11/1970, 43 y.o.   MRN: [id number]   Patient Name: Hannah [name] Date of Encounter: 09/15/2013     Principal Problem:   Intermediate coronary syndrome Active Problems:   Nephrotic syndrome   Type II or unspecified type diabetes mellitus with neurological manifestations, not stated as uncontrolled   Hyperlipidemia   Essential hypertension, benign   Normocytic anemia   Chronic kidney disease (CKD), stage V   CHF (congestive heart failure)   CAD (coronary artery disease)   Acute on chronic systolic heart failure   Cardiogenic shock    SUBJECTIVE Dyspnea improved and right foot less painful. Denies chest pain or sob. Note that HD catheter clotted and getting treated.  CURRENT MEDS . aspirin EC  81 mg Oral Daily  . atorvastatin  80 mg Oral q1800  . calcitRIOL  0.25 mcg Oral Daily  . darbepoetin (ARANESP) injection - NON-DIALYSIS  200 mcg Subcutaneous Q Wed-1800  . fentaNYL  25 mcg Intravenous Once  . fentaNYL  50 mcg Intravenous Once  . heparin  40 Units/kg Dialysis Once in dialysis  . heparin  40 Units/kg Dialysis Once in dialysis  . insulin aspart  0-9 Units Subcutaneous TID WC  . insulin aspart  3 Units Subcutaneous TID WC  . insulin detemir  10 Units Subcutaneous q morning - 10a  . insulin detemir  34 Units Subcutaneous QHS  . lidocaine (PF)  5 mL Other Once  . metoprolol  2.5 mg Intravenous Once  . midazolam  2 mg Intravenous Once  . midazolam  2 mg Intravenous Once  . sodium chloride  10-40 mL Intracatheter Q12H  . sodium chloride  3 mL Intravenous Q12H  . sodium chloride  3 mL Intravenous Q12H    OBJECTIVE  Filed Vitals:   09/15/13 0800 09/15/13 0811 09/15/13 0900 09/15/13 0929  BP: 105/61 100/59 110/53   Pulse: 130 130 129 131  Temp: 99.7 F (37.6 C) 99.7 F (37.6 C) 99.7 F (37.6 C) 99.7 F (37.6 C)  TempSrc: Core (Comment)  Core (Comment)   Resp: 17 16 20 16   Height:      Weight:      SpO2: 100% 100% 99%  100%    Intake/Output Summary (Last 24 hours) at 09/15/13 1038 Last data filed at 09/15/13 0900  Gross per 24 hour  Intake 1559.2 ml  Output   1000 ml  Net  559.2 ml   Filed Weights   09/14/13 1558 09/15/13 0600 09/15/13 0730  Weight: 269 lb 6.4 oz (122.2 kg) 271 lb 6.2 oz (123.1 kg) 269 lb 2.9 oz (122.1 kg)    PHYSICAL EXAM  General: Pleasant, NAD. Neuro: Alert and oriented X 3. Moves all extremities spontaneously. Psych: Normal affect. HEENT:  Normal  Neck: Supple without bruits or JVD. Lungs:  Resp regular and unlabored, CTA. Heart: RRR no s3, s4, or murmurs. Abdomen: Soft, non-tender, non-distended, BS + x 4.  Extremities: No clubbing, cyanosis or edema. Right foot still without pulse but no longer tender, color is good, and skin is warm.  Accessory Clinical Findings  CBC  Recent Labs  09/14/13 1500 09/15/13 0411  WBC 6.7 6.7  HGB 8.4* 8.1*  HCT 27.1* 25.0*  MCV 95.4 95.8  PLT 101* 735*   Basic Metabolic Panel  Recent Labs  09/14/13 1500 09/15/13 0411 09/15/13 0412  NA 139  --  136*  K 3.2*  --  4.1  CL 99  --  99  CO2 28  --  26  GLUCOSE 121*  --  215*  BUN 16  --  24*  CREATININE 2.15*  --  3.50*  CALCIUM 7.4*  --  7.6*  MG 2.0 2.1  --   PHOS 2.8  --  4.5   Liver Function Tests  Recent Labs  09/13/13 0428  09/14/13 1500 09/15/13 0412  AST 12  --   --   --   ALT 14  --   --   --   ALKPHOS 66  --   --   --   BILITOT 0.2*  --   --   --   PROT 5.5*  --   --   --   ALBUMIN 2.3*  < > 2.1* 2.0*  < > = values in this interval not displayed. No results found for this basename: LIPASE, AMYLASE,  in the last 72 hours Cardiac Enzymes No results found for this basename: CKTOTAL, CKMB, CKMBINDEX, TROPONINI,  in the last 72 hours BNP No components found with this basename: POCBNP,  D-Dimer No results found for this basename: DDIMER,  in the last 72 hours Hemoglobin A1C No results found for this basename: HGBA1C,  in the last 72 hours Fasting  Lipid Panel No results found for this basename: CHOL, HDL, LDLCALC, TRIG, CHOLHDL, LDLDIRECT,  in the last 72 hours Thyroid Function Tests No results found for this basename: TSH, T4TOTAL, FREET3, T3FREE, THYROIDAB,  in the last 72 hours  CarboxyHgb - 62 this morning, up from the 40's last night  TELE Probable sinus tachy vs atrial tachy vs flutter   Radiology/Studies  Dg Chest 1 View  09/14/2013   CLINICAL DATA:  Check aortic balloon pump  EXAM: CHEST - 1 VIEW  COMPARISON:  09/12/2013  FINDINGS: Cardiac shadow is stable. The left dialysis catheter is stable in the proximal superior vena cava. The right jugular temporary dialysis catheter has been removed. A new right subclavian sheath with Swan-Ganz catheter deep in the right pulmonary artery is noted. And intra-aortic balloon pump is noted superimposed over the aortic knob. The tip of the balloon pump extends beyond the room calcification of the thoracic aortic arch. Clinical correlation is recommended. Mild increased density is noted in the bases bilaterally but related to posteriorly of an effusion. Minimal left lower lobe atelectasis is seen.  IMPRESSION: Tubes and lines as described above. The tip of the balloon pump extends beyond in the margin of the room calcification in the thoracic aortic arch. Clinical correlation is recommended.  Small bilateral pleural effusions as well as left retrocardiac atelectasis.   Electronically Signed   By: Inez Catalina M.D.   On: 09/14/2013 16:13   Dg Chest 2 View  09/06/2013   CLINICAL DATA:  CHF, intermittent dyspnea  EXAM: CHEST  2 VIEW  COMPARISON:  Prior chest x-ray 08/13/2013; prior chest CT 08/22/2013  FINDINGS: Stable cardiac and mediastinal contours. Atherosclerotic calcifications are present within the transverse aorta. Moderate left and small right pleural effusions are similar compared to prior. There is persistent associated bibasilar atelectasis. Mild pulmonary vascular congestion without overt  edema. No pneumothorax. No new focal airspace consolidation. No acute osseous abnormality.  IMPRESSION: 1. Stable moderate left and small right pleural effusions and associated bibasilar atelectasis. 2. Pulmonary vascular congestion without overt edema.   Electronically Signed   By: Jacqulynn Cadet M.D.   On: 09/06/2013 07:55   Ct Chest Wo Contrast  08/22/2013   CLINICAL DATA:  Left  lung cancer status post lobectomy. Weight gain. Renal insufficiency.  EXAM: CT CHEST WITHOUT CONTRAST  TECHNIQUE: Multidetector CT imaging of the chest was performed following the standard protocol without IV contrast.  COMPARISON:  Radiographs 08/13/2013 and 08/04/2013.  CT 08/02/2012.  FINDINGS: There are stable postsurgical changes related to prior left lower lobe resection. There has been interval enlargement of several mediastinal lymph nodes. These include 12 mm right paratracheal (image 19), 13 mm precarinal (image 23), and 14 mm AP window (image 25) lymph nodes. Some of these nodes have retained fatty hila. Allowing for the limitations of noncontrast technique, the hila appear stable.  There is stable low-density within the right thyroid lobe. Atherosclerosis of the aorta, great vessels and coronary arteries is noted. The heart size is normal. There is no pericardial effusion.  Moderate size dependent pleural effusions are present bilaterally. There is associated dependent airspace disease in both lower lobes. In addition, there are more focal airspace opacities within the right lower lobe and inferior aspect of the left upper lobe. The latter is somewhat nodular, measuring up to 1.8 cm on image 43. No other focal nodularity or endobronchial lesions are demonstrated.  The visualized upper abdomen has a stable appearance. There is no adrenal mass. There is increased subcutaneous edema throughout the subcutaneous fat, especially within the anterior aspect of the upper abdomen.  No worrisome osseous findings are demonstrated.   IMPRESSION: 1. As demonstrated radiographically, there are bilateral pleural effusions and bilateral airspace opacities which are new compared with the prior CT. Although there are focal somewhat nodular components in the right lower and left upper lobes, these findings are most likely infectious/inflammatory. 2. Progressive mediastinal lymphadenopathy. Some of the nodes have retained fatty hila and may be reactive. Metastatic disease cannot be completely excluded. 3. Stable atherosclerosis and low-density within the right thyroid lobe. 4. If the airspace opacities and pleural effusions fail to respond to appropriate clinical therapy, follow-up CT or PET-CT may be warranted to exclude metastatic disease.   Electronically Signed   By: Camie Patience M.D.   On: 08/22/2013 10:26   Dg Chest Port 1 View  09/12/2013   CLINICAL DATA:  Dialysis catheter placement.  EXAM: PORTABLE CHEST - 1 VIEW  COMPARISON:  09/10/2013  FINDINGS: There has been interval placement of a left jugular dual-lumen central venous catheter with tips overlying the lower SVC. Right jugular central venous catheter remains in place with tip overlying the mid to lower SVC. The cardiac silhouette remains enlarged, unchanged. Pulmonary edema does not appear significantly changed. Veiling opacities in both lung bases are again seen, compatible with small bilateral pleural effusions. The patient has taken a greater inspiration, and there is mildly improved aeration of the lung bases. No pneumothorax is identified.  IMPRESSION: 1. Interval dialysis catheter placement as above. 2. Unchanged pulmonary edema and small bilateral pleural effusions. 3. Bibasilar atelectasis, improved from prior.   Electronically Signed   By: Logan Bores   On: 09/12/2013 13:11   Dg Chest Port 1 View  09/10/2013   CLINICAL DATA:  Congestive heart failure.  EXAM: PORTABLE CHEST - 1 VIEW  COMPARISON:  09/06/2013.  FINDINGS: 0744 hr. Right IJ central venous catheter tip terminates  at the lower SVC level. The heart size and mediastinal contours are stable. Pulmonary edema, bibasilar airspace opacities and bilateral pleural effusions are unchanged. There is no evidence of pneumothorax. Telemetry leads overlie the chest.  IMPRESSION: No significant change in edema, bilateral pleural effusions and bibasilar atelectasis.  Electronically Signed   By: Camie Patience M.D.   On: 09/10/2013 09:31   Dg Chest Port 1 View  09/06/2013   CLINICAL DATA:  Right-sided central venous catheter placement.  EXAM: PORTABLE CHEST - 1 VIEW  COMPARISON:  Chest x-ray 09/06/2013.  FINDINGS: Interval removal of previously noted right IJ catheter and replacement with a right IJ Vas-Cath, with tip terminating in the distal superior vena cava. No pneumothorax. Lung volumes are low. Extensive bibasilar opacities (left greater than right) may reflect areas of atelectasis and/or consolidation. There are superimposed small right and small to moderate left pleural effusions. Cephalization of the pulmonary vasculature, with indistinctness of the interstitial markings, suggesting a background of interstitial pulmonary edema. Mild cardiomegaly. Upper mediastinal contours are within normal limits. Atherosclerosis in the thoracic aorta.  IMPRESSION: 1. Tip of new right IJ Vas-Cath is in the distal superior vena cava. No pneumothorax. 2. The appearance of the chest suggests a background of congestive heart failure. Bibasilar opacities may simply reflect atelectasis, with superimposed pleural effusions, however, underlying airspace consolidation from infection or aspiration is not excluded.   Electronically Signed   By: Vinnie Langton M.D.   On: 09/06/2013 21:40   Dg Chest Port 1 View  09/06/2013   CLINICAL DATA:  Central catheter placement  EXAM: PORTABLE CHEST - 1 VIEW  COMPARISON:  Sep 06, 2013  FINDINGS: Central catheter tip is at the cavoatrial junction. No pneumothorax. There are bilateral effusions with bibasilar edema  and cardiomegaly. There is mild pulmonary venous hypertension. There is consolidation in the bases, more on the left than on the right.  IMPRESSION: Central catheter tip at cavoatrial junction. No pneumothorax. Congestive heart failure. Cannot exclude superimposed pneumonia in the bases, particularly on the left.   Electronically Signed   By: Lowella Grip M.D.   On: 09/06/2013 18:34   Dg Fluoro Guide Cv Line-no Report  09/12/2013   CLINICAL DATA: dialysis catheter insertion   FLOURO GUIDE CV LINE  Fluoroscopy was utilized by the requesting physician.  No radiographic  interpretation.     ASSESSMENT AND PLAN 1. Cardiogenic shock, improving 2. Cold toe, better 3. Renal failure, on HD with clotted HD catheter 4. Tachycardia, probably sinus Rec: will continue supportive care. Her hemodynamics have improved. Hopefully no additional vascular complications. Long term prognosis guarded.  Gregg Taylor,M.D.  09/15/2013 10:38 AM

## 2013-09-15 NOTE — Procedures (Signed)
I was present at this session.  I have reviewed the session itself and made appropriate changes.  Prob with cath will TPA and try again.   Hannah [name] 5/30/20158:48 AM

## 2013-09-15 NOTE — Progress Notes (Addendum)
   I have reviewed and finalized the read on the the RLE arterial duplex.  As suspected, the R CFA is nearly occluded likely related to IABP.  As discussed previously, I do not recommend anticoagulation as this is a proximal thrombus that will embolize distally once the thrombus load lyses with anticoagulation.  There remain evidence of tibial blood flow though severely diminished.    - Once Cardiology feels the patient is hemodynamically optimal, she will need a thrombectomy of the right common femoral artery.  At this point, the patient does not emergently need to go to OR.  I discussed her options with the patient and she continues to chose non-operative management.  Adele Barthel, MD [facility] Office: [phone] Pager: [phone]  09/15/2013, 3:13 PM

## 2013-09-15 NOTE — Progress Notes (Signed)
   Pt notes improved pain in right foot.  Exam unchanged: full motor and sensation intact.  No doppler signals.  - RLE arterial duplex today to evaluate the artery - Pt at very high risk for any open surgical revascularization procedure at this point - Problem with pharmacomechanical thrombectomy is I suspect IR to be reluctant with recent large bore device in R CFA  Adele Barthel, MD [facility] [place]: [phone] Pager: [phone]  09/15/2013, 7:48 AM

## 2013-09-15 NOTE — Progress Notes (Addendum)
Daily Progress Note  Assessment/Planning: POD #2 s/p LIJ TDC placement, CAD with HF, imminent ESRD, asx L ICA stenosis >80%, possible R femoral partial occlusion   Asked to see pt for ischemic R foot since IABP placement.  Per pt she has not had signals in the R foot since this AM.  Fortunately, she does not have threatened limb as she has intact motor and sensation despite not having any easily dopplered pedal signals.  If she had total arterial occlusion, after >6 hours the leg would already be non-salvageable.  I cannot find a robust R CFA artery on Sonosite.  On color flow mode, the arterial flow appears to attenuate but not terminate at the level of the groin, suggesting possible R CFA partial occlusion.  Subsequently, I would NOT start anticoagulation at this point to avoid possible embolization distally with lysis of the more proximal clot burden.    Would obtain a stat RLE arterial duplex in the AM to verify compromised R CFA flow and evaluate the distal vasculature also.  I discussed with the patient her options: proceeding to the OR for an emergency thrombectomy with the obvious risk of death due to her poor cardiac status OR observing the right leg and giving her heart some time to hopefully recover further, proceeding with thrombectomy once her heart is stronger.  At this point, the patient elects to observe the right leg.  If her sx progress, she may need to reconsider.  Subsequently, keep NPO for now.  As it is, she will also need a 4-compartment fasciotomy at the same time of the thrombectomy to manage the likely subsequent compartment syndrome.  Subjective  - 2 Days Post-Op  Resolution of previous R foot pain  Objective Filed Vitals:   09/14/13 1900 09/14/13 2000 09/14/13 2100 09/14/13 2200  BP: 85/42 81/45 88/57  99/53  Pulse: 126 127 126 125  Temp: 99.3 F (37.4 C) 99.3 F (37.4 C) 99.1 F (37.3 C) 93.7 F (34.3 C)  TempSrc:      Resp: 14 20 17 15   Height:       Weight:      SpO2: 100% 95% 97% 98%    Intake/Output Summary (Last 24 hours) at 09/15/13 0030 Last data filed at 09/14/13 1900  Gross per 24 hour  Intake 1625.4 ml  Output   1150 ml  Net  475.4 ml    PULM  BLL rales, LIJ TDC CV  Tachy, milrinone @ 0.25 mcg/kg/min, amio at 30 mg/hr GI  soft, NTND VASC  R groin: no active bleeding, RLE: no palpable pedal pulses, no signals present in PT/AT/DP/peroneal and popliteal arteries, R great toe somewhat cyanotic; LLE: easily dopplerable L DP Sonosite: collapsed R CFA lumen with possible posterior wall thrombus, color flow signal in the distal external iliac artery but very limited in CFA lumen NEURO full sensation in B feet, DF/PF 5/5 B  Laboratory CBC    Component Value Date/Time   WBC 6.7 09/14/2013 1500   HGB 8.4* 09/14/2013 1500   HCT 27.1* 09/14/2013 1500   PLT 101* 09/14/2013 1500    BMET    Component Value Date/Time   NA 139 09/14/2013 1500   K 3.2* 09/14/2013 1500   CL 99 09/14/2013 1500   CO2 28 09/14/2013 1500   GLUCOSE 121* 09/14/2013 1500   BUN 16 09/14/2013 1500   CREATININE 2.15* 09/14/2013 1500   CREATININE 4.17* 07/06/2013 1618   CALCIUM 7.4* 09/14/2013 1500   GFRNONAA 27* 09/14/2013 1500  GFRNONAA 71 07/13/2011 0835   GFRAA 31* 09/14/2013 1500   GFRAA 82 07/13/2011 Raoul, MD [facility] Office: [phone] Pager: [phone]  09/15/2013, 12:30 AM

## 2013-09-15 NOTE — Progress Notes (Signed)
Subjective: Interval History: has no complaint, leg better.  Objective: Vital signs in last 24 hours: Temp:  [93.7 F (34.3 C)-99.7 F (37.6 C)] 99.7 F (37.6 C) (05/30 0811) Pulse Rate:  [67-265] 130 (05/30 0811) Resp:  [14-23] 16 (05/30 0811) BP: (81-138)/(31-80) 111/63 mmHg (05/30 0700) SpO2:  [92 %-100 %] 100 % (05/30 0811) Weight:  [122.1 kg (269 lb 2.9 oz)-123.1 kg (271 lb 6.2 oz)] 122.1 kg (269 lb 2.9 oz) (05/30 0730) Weight change: 6.2 kg (13 lb 10.7 oz)  Intake/Output from previous day: 05/29 0701 - 05/30 0700 In: 1617.3 [P.O.:240; I.V.:1038.3; Blood:339] Out: 1000  Intake/Output this shift:    General appearance: alert, cooperative, moderately obese and pale Resp: diminished breath sounds bilaterally and rales bibasilar Cardio: S1, S2 normal and rates 120s to 140s GI: pos bs, liver down 6 cm Extremities: edema 2+,  and R leg cool,  L IJ cath  Lab Results:  Recent Labs  09/14/13 1500 09/15/13 0411  WBC 6.7 6.7  HGB 8.4* 8.1*  HCT 27.1* 25.0*  PLT 101* 100*   BMET:  Recent Labs  09/14/13 1500 09/15/13 0412  NA 139 136*  K 3.2* 4.1  CL 99 99  CO2 28 26  GLUCOSE 121* 215*  BUN 16 24*  CREATININE 2.15* 3.50*  CALCIUM 7.4* 7.6*   No results found for this basename: PTH,  in the last 72 hours Iron Studies: No results found for this basename: IRON, TIBC, TRANSFERRIN, FERRITIN,  in the last 72 hours  Studies/Results: Dg Chest 1 View  09/14/2013   CLINICAL DATA:  Check aortic balloon pump  EXAM: CHEST - 1 VIEW  COMPARISON:  09/12/2013  FINDINGS: Cardiac shadow is stable. The left dialysis catheter is stable in the proximal superior vena cava. The right jugular temporary dialysis catheter has been removed. A new right subclavian sheath with Swan-Ganz catheter deep in the right pulmonary artery is noted. And intra-aortic balloon pump is noted superimposed over the aortic knob. The tip of the balloon pump extends beyond the room calcification of the thoracic  aortic arch. Clinical correlation is recommended. Mild increased density is noted in the bases bilaterally but related to posteriorly of an effusion. Minimal left lower lobe atelectasis is seen.  IMPRESSION: Tubes and lines as described above. The tip of the balloon pump extends beyond in the margin of the room calcification in the thoracic aortic arch. Clinical correlation is recommended.  Small bilateral pleural effusions as well as left retrocardiac atelectasis.   Electronically Signed   By: Inez Catalina M.D.   On: 09/14/2013 16:13    I have reviewed the patient's current medications.  Assessment/Plan: 1 CRF Hd today , limited goals with vol. Keep solute low 2 PVD with R leg ischemia per VVS 3 CAD per Cards 4 ^ HR on Amio 5 DM controlled 6 Anemia stable 7 Carotid dz 8 ^ lipids 9 Obesity P HD, epo, follow Fe, Vit D, amio    LOS: 12 days   Hannah [name] 09/15/2013,8:49 AM

## 2013-09-16 ENCOUNTER — Inpatient Hospital Stay (HOSPITAL_COMMUNITY): Payer: Medicare Other

## 2013-09-16 DIAGNOSIS — M7989 Other specified soft tissue disorders: Secondary | ICD-10-CM

## 2013-09-16 LAB — RENAL FUNCTION PANEL
Albumin: 1.8 g/dL — ABNORMAL LOW (ref 3.5–5.2)
Albumin: 1.9 g/dL — ABNORMAL LOW (ref 3.5–5.2)
BUN: 18 mg/dL (ref 6–23)
BUN: 23 mg/dL (ref 6–23)
CALCIUM: 7.4 mg/dL — AB (ref 8.4–10.5)
CHLORIDE: 94 meq/L — AB (ref 96–112)
CO2: 27 meq/L (ref 19–32)
CO2: 27 meq/L (ref 19–32)
CREATININE: 3.19 mg/dL — AB (ref 0.50–1.10)
Calcium: 7.4 mg/dL — ABNORMAL LOW (ref 8.4–10.5)
Chloride: 98 mEq/L (ref 96–112)
Creatinine, Ser: 4.16 mg/dL — ABNORMAL HIGH (ref 0.50–1.10)
GFR calc Af Amer: 19 mL/min — ABNORMAL LOW (ref >90)
GFR, EST AFRICAN AMERICAN: 14 mL/min — AB (ref >90)
GFR, EST NON AFRICAN AMERICAN: 16 mL/min — AB (ref >90)
GFR, EST NON AFRICAN AMERICAN: 12 mL/min — AB (ref >90)
Glucose, Bld: 253 mg/dL — ABNORMAL HIGH (ref 70–99)
Glucose, Bld: 194 mg/dL — ABNORMAL HIGH (ref 70–99)
POTASSIUM: 3.8 meq/L (ref 3.7–5.3)
Phosphorus: 3.4 mg/dL (ref 2.3–4.6)
Phosphorus: 3.5 mg/dL (ref 2.3–4.6)
Potassium: 3.8 mEq/L (ref 3.7–5.3)
SODIUM: 134 meq/L — AB (ref 137–147)
Sodium: 137 mEq/L (ref 137–147)

## 2013-09-16 LAB — GLUCOSE, CAPILLARY
GLUCOSE-CAPILLARY: 202 mg/dL — AB (ref 70–99)
GLUCOSE-CAPILLARY: 275 mg/dL — AB (ref 70–99)
Glucose-Capillary: 193 mg/dL — ABNORMAL HIGH (ref 70–99)
Glucose-Capillary: 241 mg/dL — ABNORMAL HIGH (ref 70–99)

## 2013-09-16 LAB — CBC
HCT: 23.8 % — ABNORMAL LOW (ref 36.0–46.0)
Hemoglobin: 7.4 g/dL — ABNORMAL LOW (ref 12.0–15.0)
MCH: 30.2 pg (ref 26.0–34.0)
MCHC: 31.1 g/dL (ref 30.0–36.0)
MCV: 97.1 fL (ref 78.0–100.0)
Platelets: 90 10*3/uL — ABNORMAL LOW (ref 150–400)
RBC: 2.45 MIL/uL — ABNORMAL LOW (ref 3.87–5.11)
RDW: 17.9 % — ABNORMAL HIGH (ref 11.5–15.5)
WBC: 5.9 10*3/uL (ref 4.0–10.5)

## 2013-09-16 LAB — APTT: APTT: 42 s — AB (ref 24–37)

## 2013-09-16 LAB — CARBOXYHEMOGLOBIN
Carboxyhemoglobin: 1.9 % — ABNORMAL HIGH (ref 0.5–1.5)
METHEMOGLOBIN: 0.6 % (ref 0.0–1.5)
O2 Saturation: 59.7 %
TOTAL HEMOGLOBIN: 7.5 g/dL — AB (ref 12.0–16.0)

## 2013-09-16 LAB — MAGNESIUM: Magnesium: 1.9 mg/dL (ref 1.5–2.5)

## 2013-09-16 NOTE — Progress Notes (Signed)
Subjective: Interval History: has complaints tired. More and more prob.  Objective: Vital signs in last 24 hours: Temp:  [98 F (36.7 C)-100 F (37.8 C)] 98.6 F (37 C) (05/31 0700) Pulse Rate:  [119-140] 123 (05/31 0700) Resp:  [10-30] 11 (05/31 0700) BP: (81-130)/(29-83) 107/61 mmHg (05/31 0700) SpO2:  [96 %-100 %] 99 % (05/31 0700) Weight:  [118.7 kg (261 lb 11 oz)] 118.7 kg (261 lb 11 oz) (05/31 0600) Weight change: -0.4 kg (-14.1 oz)  Intake/Output from previous day: 05/30 0701 - 05/31 0700 In: 1610 [P.O.:720; I.V.:890] Out: 2200 [Urine:1000] Intake/Output this shift: Total I/O In: 95.6 [P.O.:60; I.V.:35.6] Out: -   General appearance: cooperative, fatigued, moderate distress, moderately obese and pale Neck: LIJ cath, swelling of R side of neck Resp: diminished breath sounds bilaterally and rales bibasilar Chest wall: swelling of R side of chest and R arm Cardio: S1, S2 normal and hr 120s GI: obese, pos bs, soft Extremities: edema 2+ and R leg purplish hue and cold  Lab Results:  Recent Labs  09/15/13 0411 09/16/13 0447  WBC 6.7 5.9  HGB 8.1* 7.4*  HCT 25.0* 23.8*  PLT 100* 90*   BMET:  Recent Labs  09/15/13 2103 09/16/13 0447  NA 137 137  K 4.0 3.8  CL 100 98  CO2 29 27  GLUCOSE 214* 253*  BUN 13 18  CREATININE 2.30* 3.19*  CALCIUM 6.9* 7.4*   No results found for this basename: PTH,  in the last 72 hours Iron Studies: No results found for this basename: IRON, TIBC, TRANSFERRIN, FERRITIN,  in the last 72 hours  Studies/Results: Dg Chest 1 View  09/14/2013   CLINICAL DATA:  Check aortic balloon pump  EXAM: CHEST - 1 VIEW  COMPARISON:  09/12/2013  FINDINGS: Cardiac shadow is stable. The left dialysis catheter is stable in the proximal superior vena cava. The right jugular temporary dialysis catheter has been removed. A new right subclavian sheath with Swan-Ganz catheter deep in the right pulmonary artery is noted. And intra-aortic balloon pump is  noted superimposed over the aortic knob. The tip of the balloon pump extends beyond the room calcification of the thoracic aortic arch. Clinical correlation is recommended. Mild increased density is noted in the bases bilaterally but related to posteriorly of an effusion. Minimal left lower lobe atelectasis is seen.  IMPRESSION: Tubes and lines as described above. The tip of the balloon pump extends beyond in the margin of the room calcification in the thoracic aortic arch. Clinical correlation is recommended.  Small bilateral pleural effusions as well as left retrocardiac atelectasis.   Electronically Signed   By: Inez Catalina M.D.   On: 09/14/2013 16:13    I have reviewed the patient's current medications.  Assessment/Plan: 1 ESRD HD in am .  Will need 5-6X/wk 2 anemia drifting down 3 CAD per cards/CVTS 4 ^ HR 5 Ischemic leg per VVS 6 Carotid dz VVS 7 DM 8 Obesity 9 ? Clot R [place] 10HPTH med P HD, epo, ? Declot, vit D, Amio    LOS: 13 days   Emalina Dubreuil L Dalphine Cowie 09/16/2013,8:26 AM

## 2013-09-16 NOTE — Progress Notes (Addendum)
   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  She retains motor and sensation in R foot.  In fact, the patient is complaining more of R chest pain and R arm swelling, suggestive of possible DVT development, likely around the Cordis sheath.  RUE venous duplex recommended.  Cardiac function appears to be unchanged: still on same drips  At some point, if no further cardiac improvement can be expected, the patient might have to consider going back to the OR for the thrombectomy regardless of her cardiac status.    In the best case scenario, only a limited common femoral artery thrombectomy under local anesthesia would be needed.  The problem is I expect that isn't going to be adequate.  Additionally a popliteal thrombectomy and four compartment fasciotomy may likely be needed.  Neither of these procedures have I ever done under local anesthesia.  I discussed again the options with the patient.  At this point, she again elected to OBSERVE the right leg rather than proceeding to the OR.  Dr. Trula Slade will be back tomorrow.  Subjective  - 3 Days Post-Op  C/o R arm swelling and R chest pain  Objective Filed Vitals:   09/16/13 0400 09/16/13 0500 09/16/13 0600 09/16/13 0700  BP: 103/58 108/54 108/67 107/61  Pulse: 123 122 123 123  Temp: 98.6 F (37 C) 98.8 F (37.1 C) 98.8 F (37.1 C) 98.6 F (37 C)  TempSrc:      Resp: 14 16 15 11   Height:      Weight:   261 lb 11 oz (118.7 kg)   SpO2: 100% 99% 100% 99%    Intake/Output Summary (Last 24 hours) at 09/16/13 0810 Last data filed at 09/16/13 0700  Gross per 24 hour  Intake 1478.8 ml  Output   2200 ml  Net -721.2 ml    PULM  BLL rales CV  Tachy, RR, milrinone at 0.25 mg/kg/min and amio @ 30 mg/hr GI  soft, NTND VASC  R groin without hematoma or bruit, R foot unchanged, intact light  touch sensation and motor EXT  Slight R arm swelling  Laboratory CBC    Component Value Date/Time   WBC 5.9 09/16/2013 0447   HGB 7.4* 09/16/2013 0447   HCT 23.8* 09/16/2013 0447   PLT 90* 09/16/2013 0447    BMET    Component Value Date/Time   NA 137 09/16/2013 0447   K 3.8 09/16/2013 0447   CL 98 09/16/2013 0447   CO2 27 09/16/2013 0447   GLUCOSE 253* 09/16/2013 0447   BUN 18 09/16/2013 0447   CREATININE 3.19* 09/16/2013 0447   CREATININE 4.17* 07/06/2013 1618   CALCIUM 7.4* 09/16/2013 0447   GFRNONAA 16* 09/16/2013 0447   GFRNONAA 71 07/13/2011 0835   GFRAA 19* 09/16/2013 [place] 82 07/13/2011 [place], MD [facility] Office: [phone] Pager: [phone]  09/16/2013, 8:10 AM

## 2013-09-16 NOTE — Progress Notes (Addendum)
Dr. Cristopher Peru paged regarding patient's increasing arm tightness and swelling.  Awaiting return call.  Ulnar and radial pulses dopplered on R arm, R brachial pulse absent.  L brachial pulse dopplered.  Dampened pulmonary artery swan waveform.    Dr. Lovena Le returned page, orders received for CXR to assess Swan placement and RUE venous duplex.  Will continue to assess.   Doran Clay, RN

## 2013-09-16 NOTE — Progress Notes (Addendum)
Patient ID: Hannah [name], female   DOB: 1971/01/14, 43 y.o.   MRN: [id number]   Patient Name: Hannah [name] Date of Encounter: 09/16/2013     Principal Problem:   Intermediate coronary syndrome Active Problems:   Nephrotic syndrome   Type II or unspecified type diabetes mellitus with neurological manifestations, not stated as uncontrolled   Hyperlipidemia   Essential hypertension, benign   Normocytic anemia   Chronic kidney disease (CKD), stage V   CHF (congestive heart failure)   CAD (coronary artery disease)   Acute on chronic systolic heart failure   Cardiogenic shock    SUBJECTIVE Dyspnea improved but right foot more painful. Denies chest pain or sob. Note that HD catheter clot was treated and she was able to dialyze yesterday. Note Dr. Deterding's plan for mostly daily HD for the next several days.  CURRENT MEDS . aspirin EC  81 mg Oral Daily  . atorvastatin  80 mg Oral q1800  . calcitRIOL  0.25 mcg Oral Daily  . darbepoetin (ARANESP) injection - NON-DIALYSIS  200 mcg Subcutaneous Q Wed-1800  . fentaNYL  25 mcg Intravenous Once  . fentaNYL  50 mcg Intravenous Once  . heparin  40 Units/kg Dialysis Once in dialysis  . heparin  40 Units/kg Dialysis Once in dialysis  . insulin aspart  0-9 Units Subcutaneous TID WC  . insulin aspart  3 Units Subcutaneous TID WC  . insulin detemir  10 Units Subcutaneous q morning - 10a  . insulin detemir  34 Units Subcutaneous QHS  . lidocaine (PF)  5 mL Other Once  . metoprolol  2.5 mg Intravenous Once  . midazolam  2 mg Intravenous Once  . midazolam  2 mg Intravenous Once  . sodium chloride  10-40 mL Intracatheter Q12H  . sodium chloride  3 mL Intravenous Q12H  . sodium chloride  3 mL Intravenous Q12H    OBJECTIVE  Filed Vitals:   09/16/13 0500 09/16/13 0600 09/16/13 0700 09/16/13 0800  BP: 108/54 108/67 107/61   Pulse: 122 123 123   Temp: 98.8 F (37.1 C) 98.8 F (37.1 C) 98.6 F (37 C)   TempSrc:    Core (Comment)   Resp: 16 15 11    Height:      Weight:  261 lb 11 oz (118.7 kg)    SpO2: 99% 100% 99%     Intake/Output Summary (Last 24 hours) at 09/16/13 0846 Last data filed at 09/16/13 0800  Gross per 24 hour  Intake 1574.4 ml  Output   2200 ml  Net -625.6 ml   Filed Weights   09/15/13 0600 09/15/13 0730 09/16/13 0600  Weight: 271 lb 6.2 oz (123.1 kg) 269 lb 2.9 oz (122.1 kg) 261 lb 11 oz (118.7 kg)    PHYSICAL EXAM  General: Pleasant, morbidly obese, NAD. Neuro: Alert and oriented X 3. Moves all extremities spontaneously. Psych: Normal affect. HEENT:  Normal  Neck: Supple without bruits or JVD. Lungs:  Resp regular and unlabored, CTA. Heart: RRR no s3, s4, or murmurs. Abdomen: Soft, non-tender, non-distended, BS + x 4.  Extremities: No clubbing, cyanosis or edema. Right foot still without pulse and is now cool and tender to palpation.  Accessory Clinical Findings  CBC  Recent Labs  09/15/13 0411 09/16/13 0447  WBC 6.7 5.9  HGB 8.1* 7.4*  HCT 25.0* 23.8*  MCV 95.8 97.1  PLT 100* 90*   Basic Metabolic Panel  Recent Labs  09/15/13 0411  09/15/13 2103 09/16/13 0447  NA  --   < > 137 137  K  --   < > 4.0 3.8  CL  --   < > 100 98  CO2  --   < > 29 27  GLUCOSE  --   < > 214* 253*  BUN  --   < > 13 18  CREATININE  --   < > 2.30* 3.19*  CALCIUM  --   < > 6.9* 7.4*  MG 2.1  --   --  1.9  PHOS  --   < > 2.7 3.4  < > = values in this interval not displayed. Liver Function Tests  Recent Labs  09/15/13 2103 09/16/13 0447  ALBUMIN 1.8* 1.8*   No results found for this basename: LIPASE, AMYLASE,  in the last 72 hours Cardiac Enzymes No results found for this basename: CKTOTAL, CKMB, CKMBINDEX, TROPONINI,  in the last 72 hours BNP No components found with this basename: POCBNP,  D-Dimer No results found for this basename: DDIMER,  in the last 72 hours Hemoglobin A1C No results found for this basename: HGBA1C,  in the last 72 hours Fasting Lipid Panel No results  found for this basename: CHOL, HDL, LDLCALC, TRIG, CHOLHDL, LDLDIRECT,  in the last 72 hours Thyroid Function Tests No results found for this basename: TSH, T4TOTAL, FREET3, T3FREE, THYROIDAB,  in the last 72 hours  CarboxyHgb - 62 this morning, up from the 40's last night  TELE Probable sinus tachy    Radiology/Studies  Dg Chest 1 View  09/14/2013   CLINICAL DATA:  Check aortic balloon pump  EXAM: CHEST - 1 VIEW  COMPARISON:  09/12/2013  FINDINGS: Cardiac shadow is stable. The left dialysis catheter is stable in the proximal superior vena cava. The right jugular temporary dialysis catheter has been removed. A new right subclavian sheath with Swan-Ganz catheter deep in the right pulmonary artery is noted. And intra-aortic balloon pump is noted superimposed over the aortic knob. The tip of the balloon pump extends beyond the room calcification of the thoracic aortic arch. Clinical correlation is recommended. Mild increased density is noted in the bases bilaterally but related to posteriorly of an effusion. Minimal left lower lobe atelectasis is seen.  IMPRESSION: Tubes and lines as described above. The tip of the balloon pump extends beyond in the margin of the room calcification in the thoracic aortic arch. Clinical correlation is recommended.  Small bilateral pleural effusions as well as left retrocardiac atelectasis.   Electronically Signed   By: Inez Catalina M.D.   On: 09/14/2013 16:13   Dg Chest 2 View  09/06/2013   CLINICAL DATA:  CHF, intermittent dyspnea  EXAM: CHEST  2 VIEW  COMPARISON:  Prior chest x-ray 08/13/2013; prior chest CT 08/22/2013  FINDINGS: Stable cardiac and mediastinal contours. Atherosclerotic calcifications are present within the transverse aorta. Moderate left and small right pleural effusions are similar compared to prior. There is persistent associated bibasilar atelectasis. Mild pulmonary vascular congestion without overt edema. No pneumothorax. No new focal airspace  consolidation. No acute osseous abnormality.  IMPRESSION: 1. Stable moderate left and small right pleural effusions and associated bibasilar atelectasis. 2. Pulmonary vascular congestion without overt edema.   Electronically Signed   By: Jacqulynn Cadet M.D.   On: 09/06/2013 07:55   Ct Chest Wo Contrast  08/22/2013   CLINICAL DATA:  Left lung cancer status post lobectomy. Weight gain. Renal insufficiency.  EXAM: CT CHEST WITHOUT CONTRAST  TECHNIQUE: Multidetector CT imaging of the  chest was performed following the standard protocol without IV contrast.  COMPARISON:  Radiographs 08/13/2013 and 08/04/2013.  CT 08/02/2012.  FINDINGS: There are stable postsurgical changes related to prior left lower lobe resection. There has been interval enlargement of several mediastinal lymph nodes. These include 12 mm right paratracheal (image 19), 13 mm precarinal (image 23), and 14 mm AP window (image 25) lymph nodes. Some of these nodes have retained fatty hila. Allowing for the limitations of noncontrast technique, the hila appear stable.  There is stable low-density within the right thyroid lobe. Atherosclerosis of the aorta, great vessels and coronary arteries is noted. The heart size is normal. There is no pericardial effusion.  Moderate size dependent pleural effusions are present bilaterally. There is associated dependent airspace disease in both lower lobes. In addition, there are more focal airspace opacities within the right lower lobe and inferior aspect of the left upper lobe. The latter is somewhat nodular, measuring up to 1.8 cm on image 43. No other focal nodularity or endobronchial lesions are demonstrated.  The visualized upper abdomen has a stable appearance. There is no adrenal mass. There is increased subcutaneous edema throughout the subcutaneous fat, especially within the anterior aspect of the upper abdomen.  No worrisome osseous findings are demonstrated.  IMPRESSION: 1. As demonstrated  radiographically, there are bilateral pleural effusions and bilateral airspace opacities which are new compared with the prior CT. Although there are focal somewhat nodular components in the right lower and left upper lobes, these findings are most likely infectious/inflammatory. 2. Progressive mediastinal lymphadenopathy. Some of the nodes have retained fatty hila and may be reactive. Metastatic disease cannot be completely excluded. 3. Stable atherosclerosis and low-density within the right thyroid lobe. 4. If the airspace opacities and pleural effusions fail to respond to appropriate clinical therapy, follow-up CT or PET-CT may be warranted to exclude metastatic disease.   Electronically Signed   By: Camie Patience M.D.   On: 08/22/2013 10:26   Dg Chest Port 1 View  09/12/2013   CLINICAL DATA:  Dialysis catheter placement.  EXAM: PORTABLE CHEST - 1 VIEW  COMPARISON:  09/10/2013  FINDINGS: There has been interval placement of a left jugular dual-lumen central venous catheter with tips overlying the lower SVC. Right jugular central venous catheter remains in place with tip overlying the mid to lower SVC. The cardiac silhouette remains enlarged, unchanged. Pulmonary edema does not appear significantly changed. Veiling opacities in both lung bases are again seen, compatible with small bilateral pleural effusions. The patient has taken a greater inspiration, and there is mildly improved aeration of the lung bases. No pneumothorax is identified.  IMPRESSION: 1. Interval dialysis catheter placement as above. 2. Unchanged pulmonary edema and small bilateral pleural effusions. 3. Bibasilar atelectasis, improved from prior.   Electronically Signed   By: Logan Bores   On: 09/12/2013 13:11   Dg Chest Port 1 View  09/10/2013   CLINICAL DATA:  Congestive heart failure.  EXAM: PORTABLE CHEST - 1 VIEW  COMPARISON:  09/06/2013.  FINDINGS: 0744 hr. Right IJ central venous catheter tip terminates at the lower SVC level. The  heart size and mediastinal contours are stable. Pulmonary edema, bibasilar airspace opacities and bilateral pleural effusions are unchanged. There is no evidence of pneumothorax. Telemetry leads overlie the chest.  IMPRESSION: No significant change in edema, bilateral pleural effusions and bibasilar atelectasis.   Electronically Signed   By: Camie Patience M.D.   On: 09/10/2013 09:31   Dg Chest [facility]  09/06/2013   CLINICAL DATA:  Right-sided central venous catheter placement.  EXAM: PORTABLE CHEST - 1 VIEW  COMPARISON:  Chest x-ray 09/06/2013.  FINDINGS: Interval removal of previously noted right IJ catheter and replacement with a right IJ Vas-Cath, with tip terminating in the distal superior vena cava. No pneumothorax. Lung volumes are low. Extensive bibasilar opacities (left greater than right) may reflect areas of atelectasis and/or consolidation. There are superimposed small right and small to moderate left pleural effusions. Cephalization of the pulmonary vasculature, with indistinctness of the interstitial markings, suggesting a background of interstitial pulmonary edema. Mild cardiomegaly. Upper mediastinal contours are within normal limits. Atherosclerosis in the thoracic aorta.  IMPRESSION: 1. Tip of new right IJ Vas-Cath is in the distal superior vena cava. No pneumothorax. 2. The appearance of the chest suggests a background of congestive heart failure. Bibasilar opacities may simply reflect atelectasis, with superimposed pleural effusions, however, underlying airspace consolidation from infection or aspiration is not excluded.   Electronically Signed   By: Vinnie Langton M.D.   On: 09/06/2013 21:40   Dg Chest Port 1 View  09/06/2013   CLINICAL DATA:  Central catheter placement  EXAM: PORTABLE CHEST - 1 VIEW  COMPARISON:  Sep 06, 2013  FINDINGS: Central catheter tip is at the cavoatrial junction. No pneumothorax. There are bilateral effusions with bibasilar edema and cardiomegaly. There is  mild pulmonary venous hypertension. There is consolidation in the bases, more on the left than on the right.  IMPRESSION: Central catheter tip at cavoatrial junction. No pneumothorax. Congestive heart failure. Cannot exclude superimposed pneumonia in the bases, particularly on the left.   Electronically Signed   By: Lowella Grip M.D.   On: 09/06/2013 18:34   Dg Fluoro Guide Cv Line-no Report  09/12/2013   CLINICAL DATA: dialysis catheter insertion   FLOURO GUIDE CV LINE  Fluoroscopy was utilized by the requesting physician.  No radiographic  interpretation.     ASSESSMENT AND PLAN 1. Cardiogenic shock, improved, carboxy Hgb 59 (62) today. Stable. 2. Cold toe, appears worse than yesterday 3. Renal failure, on HD 4. Tachycardia, probably sinus 5.chest pain - this is related to her indwelling catheter as it can be reproduced by moving her right arm and by palpation of area around swan insertion site.  Rec: will continue supportive care. Her hemodynamics have stabilized. I am concerned about ongoing embolic complications though I am not sure she is a candidate for invasive treatment. Long term prognosis guarded.  Khaleef Ruby,M.D.  09/16/2013 8:46 AM

## 2013-09-16 NOTE — Progress Notes (Signed)
VASCULAR LAB PRELIMINARY  PRELIMINARY  PRELIMINARY  PRELIMINARY  Right upper extremity venous Doppler completed.    Preliminary report:  There is no obvious evidence of DVT or SVT noted in the right upper extremity.  Incidentally, waveforms and color flow appear normal in the right brachial, ulnar, and radial arteries.  Iantha Fallen, RVT 09/16/2013, 5:34 PM

## 2013-09-17 ENCOUNTER — Inpatient Hospital Stay (HOSPITAL_COMMUNITY): Payer: Medicare Other

## 2013-09-17 ENCOUNTER — Ambulatory Visit: Payer: Medicare Other | Admitting: Surgery

## 2013-09-17 ENCOUNTER — Encounter (HOSPITAL_COMMUNITY): Payer: Medicare Other | Admitting: Anesthesiology

## 2013-09-17 ENCOUNTER — Inpatient Hospital Stay (HOSPITAL_COMMUNITY): Payer: Medicare Other | Admitting: Anesthesiology

## 2013-09-17 ENCOUNTER — Encounter (HOSPITAL_COMMUNITY)
Admission: EM | Disposition: A | Discharge status: Home Health | Payer: Self-pay | Source: Home / Self Care | Attending: Interventional Cardiology

## 2013-09-17 DIAGNOSIS — J810 Acute pulmonary edema: Secondary | ICD-10-CM

## 2013-09-17 DIAGNOSIS — M79A29 Nontraumatic compartment syndrome of unspecified lower extremity: Secondary | ICD-10-CM

## 2013-09-17 DIAGNOSIS — I5023 Acute on chronic systolic (congestive) heart failure: Principal | ICD-10-CM

## 2013-09-17 DIAGNOSIS — I999 Unspecified disorder of circulatory system: Secondary | ICD-10-CM

## 2013-09-17 DIAGNOSIS — N186 End stage renal disease: Secondary | ICD-10-CM

## 2013-09-17 HISTORY — PX: EMBOLECTOMY: SHX44

## 2013-09-17 HISTORY — PX: PATCH ANGIOPLASTY: SHX6230

## 2013-09-17 HISTORY — PX: FASCIOTOMY: SHX132

## 2013-09-17 HISTORY — PX: ENDARTERECTOMY FEMORAL: SHX5804

## 2013-09-17 LAB — POCT I-STAT 7, (LYTES, BLD GAS, ICA,H+H)
Acid-Base Excess: 1 mmol/L (ref 0.0–2.0)
Acid-base deficit: 1 mmol/L (ref 0.0–2.0)
Bicarbonate: 23.5 mEq/L (ref 20.0–24.0)
Bicarbonate: 25.6 mEq/L — ABNORMAL HIGH (ref 20.0–24.0)
CALCIUM ION: 0.91 mmol/L — AB (ref 1.12–1.23)
CALCIUM ION: 1.1 mmol/L — AB (ref 1.12–1.23)
HCT: 27 % — ABNORMAL LOW (ref 36.0–46.0)
HEMATOCRIT: 26 % — AB (ref 36.0–46.0)
HEMOGLOBIN: 8.8 g/dL — AB (ref 12.0–15.0)
HEMOGLOBIN: 9.2 g/dL — AB (ref 12.0–15.0)
O2 SAT: 97 %
O2 Saturation: 99 %
PH ART: 7.389 (ref 7.350–7.450)
PH ART: 7.392 (ref 7.350–7.450)
POTASSIUM: 3 meq/L — AB (ref 3.7–5.3)
Potassium: 3.6 mEq/L — ABNORMAL LOW (ref 3.7–5.3)
SODIUM: 136 meq/L — AB (ref 137–147)
Sodium: 133 mEq/L — ABNORMAL LOW (ref 137–147)
TCO2: 25 mmol/L (ref 0–100)
TCO2: 27 mmol/L (ref 0–100)
pCO2 arterial: 38.9 mmHg (ref 35.0–45.0)
pCO2 arterial: 42.2 mmHg (ref 35.0–45.0)
pO2, Arterial: 94 mmHg (ref 80.0–100.0)
pO2, Arterial: 166 mmHg — ABNORMAL HIGH (ref 80.0–100.0)

## 2013-09-17 LAB — GLUCOSE, CAPILLARY
GLUCOSE-CAPILLARY: 128 mg/dL — AB (ref 70–99)
GLUCOSE-CAPILLARY: 221 mg/dL — AB (ref 70–99)
GLUCOSE-CAPILLARY: 214 mg/dL — AB (ref 70–99)
Glucose-Capillary: 190 mg/dL — ABNORMAL HIGH (ref 70–99)

## 2013-09-17 LAB — POCT I-STAT 3, ART BLOOD GAS (G3+)
ACID-BASE DEFICIT: 7 mmol/L — AB (ref 0.0–2.0)
Bicarbonate: 17.4 mEq/L — ABNORMAL LOW (ref 20.0–24.0)
O2 SAT: 98 %
Patient temperature: 97.9
TCO2: 18 mmol/L (ref 0–100)
pCO2 arterial: 31.2 mmHg — ABNORMAL LOW (ref 35.0–45.0)
pH, Arterial: 7.354 (ref 7.350–7.450)
pO2, Arterial: 107 mmHg — ABNORMAL HIGH (ref 80.0–100.0)

## 2013-09-17 LAB — PHOSPHORUS: Phosphorus: 4.5 mg/dL (ref 2.3–4.6)

## 2013-09-17 LAB — CBC
HEMATOCRIT: 22.5 % — AB (ref 36.0–46.0)
HEMOGLOBIN: 7 g/dL — AB (ref 12.0–15.0)
MCH: 30.2 pg (ref 26.0–34.0)
MCHC: 31.1 g/dL (ref 30.0–36.0)
MCV: 97 fL (ref 78.0–100.0)
Platelets: 112 10*3/uL — ABNORMAL LOW (ref 150–400)
RBC: 2.32 MIL/uL — ABNORMAL LOW (ref 3.87–5.11)
RDW: 18.2 % — ABNORMAL HIGH (ref 11.5–15.5)
WBC: 6.3 10*3/uL (ref 4.0–10.5)

## 2013-09-17 LAB — COMPREHENSIVE METABOLIC PANEL
ALBUMIN: 1.8 g/dL — AB (ref 3.5–5.2)
ALT: 6 U/L (ref 0–35)
AST: 10 U/L (ref 0–37)
Alkaline Phosphatase: 66 U/L (ref 39–117)
BILIRUBIN TOTAL: 0.3 mg/dL (ref 0.3–1.2)
BUN: 30 mg/dL — AB (ref 6–23)
CO2: 25 mEq/L (ref 19–32)
CREATININE: 4.98 mg/dL — AB (ref 0.50–1.10)
Calcium: 7.5 mg/dL — ABNORMAL LOW (ref 8.4–10.5)
Chloride: 92 mEq/L — ABNORMAL LOW (ref 96–112)
GFR calc Af Amer: 11 mL/min — ABNORMAL LOW (ref >90)
GFR calc non Af Amer: 10 mL/min — ABNORMAL LOW (ref >90)
Glucose, Bld: 270 mg/dL — ABNORMAL HIGH (ref 70–99)
Potassium: 4.1 mEq/L (ref 3.7–5.3)
Sodium: 131 mEq/L — ABNORMAL LOW (ref 137–147)
TOTAL PROTEIN: 4.8 g/dL — AB (ref 6.0–8.3)

## 2013-09-17 LAB — RENAL FUNCTION PANEL
ALBUMIN: 2.3 g/dL — AB (ref 3.5–5.2)
BUN: 16 mg/dL (ref 6–23)
CALCIUM: 7.6 mg/dL — AB (ref 8.4–10.5)
CHLORIDE: 96 meq/L (ref 96–112)
CO2: 22 meq/L (ref 19–32)
Creatinine, Ser: 3.15 mg/dL — ABNORMAL HIGH (ref 0.50–1.10)
GFR, EST AFRICAN AMERICAN: 19 mL/min — AB (ref >90)
GFR, EST NON AFRICAN AMERICAN: 17 mL/min — AB (ref >90)
Glucose, Bld: 232 mg/dL — ABNORMAL HIGH (ref 70–99)
Phosphorus: 3.9 mg/dL (ref 2.3–4.6)
Potassium: 4.1 mEq/L (ref 3.7–5.3)
Sodium: 134 mEq/L — ABNORMAL LOW (ref 137–147)

## 2013-09-17 LAB — CARBOXYHEMOGLOBIN
CARBOXYHEMOGLOBIN: 2.2 % — AB (ref 0.5–1.5)
METHEMOGLOBIN: 0.9 % (ref 0.0–1.5)
O2 Saturation: 59.7 %
TOTAL HEMOGLOBIN: 6.5 g/dL — AB (ref 12.0–16.0)

## 2013-09-17 LAB — APTT: APTT: 37 s (ref 24–37)

## 2013-09-17 LAB — MAGNESIUM: Magnesium: 2 mg/dL (ref 1.5–2.5)

## 2013-09-17 LAB — PREPARE RBC (CROSSMATCH)

## 2013-09-17 SURGERY — EMBOLECTOMY
Anesthesia: General | Site: Leg Lower | Laterality: Right

## 2013-09-17 MED ORDER — PHENYLEPHRINE HCL 10 MG/ML IJ SOLN
INTRAMUSCULAR | Status: DC | PRN
  Administered 2013-09-17: 240 ug via INTRAVENOUS
  Administered 2013-09-17: 160 ug via INTRAVENOUS
  Administered 2013-09-17: 80 ug via INTRAVENOUS
  Administered 2013-09-17: 160 ug via INTRAVENOUS

## 2013-09-17 MED ORDER — ONDANSETRON HCL 4 MG/2ML IJ SOLN
INTRAMUSCULAR | Status: AC
  Filled 2013-09-17: qty 2

## 2013-09-17 MED ORDER — OXYCODONE HCL 5 MG PO TABS: 5.0000 mg | ORAL_TABLET | Freq: Once | ORAL | Status: DC | PRN

## 2013-09-17 MED ORDER — ROCURONIUM BROMIDE 100 MG/10ML IV SOLN
INTRAVENOUS | Status: DC | PRN
  Administered 2013-09-17: 10 mg via INTRAVENOUS
  Administered 2013-09-17: 40 mg via INTRAVENOUS
  Administered 2013-09-17: 10 mg via INTRAVENOUS
  Administered 2013-09-17: 30 mg via INTRAVENOUS
  Administered 2013-09-17: 10 mg via INTRAVENOUS

## 2013-09-17 MED ORDER — PROPOFOL 10 MG/ML IV BOLUS
INTRAVENOUS | Status: AC
  Filled 2013-09-17: qty 20

## 2013-09-17 MED ORDER — OXYCODONE HCL 5 MG/5ML PO SOLN: 5.0000 mg | Freq: Once | ORAL | Status: DC | PRN

## 2013-09-17 MED ORDER — CALCIUM CHLORIDE 10 % IV SOLN
INTRAVENOUS | Status: DC | PRN
  Administered 2013-09-17: 1 g via INTRAVENOUS

## 2013-09-17 MED ORDER — PHENYLEPHRINE HCL 10 MG/ML IJ SOLN
INTRAMUSCULAR | Status: AC
  Filled 2013-09-17: qty 1

## 2013-09-17 MED ORDER — CEFUROXIME SODIUM 1.5 G IJ SOLR
1.5000 g | Freq: Two times a day (BID) | INTRAMUSCULAR | Status: DC
  Filled 2013-09-17: qty 1.5

## 2013-09-17 MED ORDER — SODIUM CHLORIDE 0.9 % IV SOLN: 100.0000 mL | INTRAVENOUS | Status: DC | PRN

## 2013-09-17 MED ORDER — DEXMEDETOMIDINE HCL IN NACL 200 MCG/50ML IV SOLN
INTRAVENOUS | Status: DC | PRN
  Administered 2013-09-17: 0.4 ug/kg/h via INTRAVENOUS

## 2013-09-17 MED ORDER — CEFUROXIME SODIUM 1.5 G IJ SOLR
1.5000 g | INTRAMUSCULAR | Status: AC
  Administered 2013-09-18: 1.5 g via INTRAVENOUS
  Filled 2013-09-17: qty 1.5

## 2013-09-17 MED ORDER — 0.9 % SODIUM CHLORIDE (POUR BTL) OPTIME
TOPICAL | Status: DC | PRN
Start: 2013-09-17 — End: 2013-09-17
  Administered 2013-09-17: 2000 mL

## 2013-09-17 MED ORDER — FENTANYL CITRATE 0.05 MG/ML IJ SOLN
INTRAMUSCULAR | Status: DC | PRN
  Administered 2013-09-17: 50 ug via INTRAVENOUS
  Administered 2013-09-17: 25 ug via INTRAVENOUS
  Administered 2013-09-17: 75 ug via INTRAVENOUS
  Administered 2013-09-17: 100 ug via INTRAVENOUS

## 2013-09-17 MED ORDER — HYDROMORPHONE HCL PF 1 MG/ML IJ SOLN: 0.2500 mg | INTRAMUSCULAR | Status: DC | PRN

## 2013-09-17 MED ORDER — FENTANYL CITRATE 0.05 MG/ML IJ SOLN
INTRAMUSCULAR | Status: AC
  Filled 2013-09-17: qty 5

## 2013-09-17 MED ORDER — DEXMEDETOMIDINE HCL IN NACL 400 MCG/100ML IV SOLN
0.4000 ug/kg/h | INTRAVENOUS | Status: AC
  Administered 2013-09-17: 0.7 ug/h via INTRAVENOUS
  Filled 2013-09-17 (×3): qty 100

## 2013-09-17 MED ORDER — DEXMEDETOMIDINE HCL IN NACL 400 MCG/100ML IV SOLN
0.4000 ug/kg/h | INTRAVENOUS | Status: AC
  Administered 2013-09-17: 0.7 ug/h via INTRAVENOUS

## 2013-09-17 MED ORDER — DEXTROSE 5 % IV SOLN
1.5000 g | INTRAVENOUS | Status: AC
  Administered 2013-09-17: 1.5 g via INTRAVENOUS
  Filled 2013-09-17: qty 1.5

## 2013-09-17 MED ORDER — SODIUM CHLORIDE 0.9 % IR SOLN
Status: DC | PRN
  Administered 2013-09-17: 14:00:00

## 2013-09-17 MED ORDER — PENTAFLUOROPROP-TETRAFLUOROETH EX AERO: 1.0000 "application " | INHALATION_SPRAY | CUTANEOUS | Status: DC | PRN

## 2013-09-17 MED ORDER — HEPARIN SODIUM (PORCINE) 1000 UNIT/ML IJ SOLN
INTRAMUSCULAR | Status: AC
  Filled 2013-09-17: qty 1

## 2013-09-17 MED ORDER — SODIUM CHLORIDE 0.9 % IV SOLN
INTRAVENOUS | Status: DC | PRN
  Administered 2013-09-17: 13:00:00 via INTRAVENOUS

## 2013-09-17 MED ORDER — LIDOCAINE HCL (CARDIAC) 20 MG/ML IV SOLN
INTRAVENOUS | Status: DC | PRN
  Administered 2013-09-17: 60 mg via INTRAVENOUS

## 2013-09-17 MED ORDER — HEPARIN SODIUM (PORCINE) 1000 UNIT/ML DIALYSIS
40.0000 [IU]/kg | Freq: Once | INTRAMUSCULAR | Status: DC
  Filled 2013-09-17: qty 5

## 2013-09-17 MED ORDER — NOREPINEPHRINE BITARTRATE 1 MG/ML IV SOLN
2.0000 ug/min | INTRAVENOUS | Status: AC
  Administered 2013-09-17: 5 ug/min via INTRAVENOUS
  Filled 2013-09-17: qty 4

## 2013-09-17 MED ORDER — ONDANSETRON HCL 4 MG/2ML IJ SOLN: 4.0000 mg | Freq: Once | INTRAMUSCULAR | Status: DC | PRN

## 2013-09-17 MED ORDER — LIDOCAINE-PRILOCAINE 2.5-2.5 % EX CREA: 1.0000 "application " | TOPICAL_CREAM | CUTANEOUS | Status: DC | PRN

## 2013-09-17 MED ORDER — ONDANSETRON HCL 4 MG/2ML IJ SOLN
INTRAMUSCULAR | Status: DC | PRN
Start: 2013-09-17 — End: 2013-09-17
  Administered 2013-09-17: 4 mg via INTRAVENOUS

## 2013-09-17 MED ORDER — SODIUM CHLORIDE 0.9 % IV SOLN
100.0000 mL | INTRAVENOUS | Status: DC | PRN
Start: 2013-09-17 — End: 2013-09-22

## 2013-09-17 MED ORDER — NEPRO/CARBSTEADY PO LIQD: 237.0000 mL | ORAL | Status: DC | PRN

## 2013-09-17 MED ORDER — PANTOPRAZOLE SODIUM 40 MG PO TBEC
40.0000 mg | DELAYED_RELEASE_TABLET | Freq: Every day | ORAL | Status: DC
  Administered 2013-09-17: 40 mg via ORAL
  Filled 2013-09-17: qty 1

## 2013-09-17 MED ORDER — FENTANYL CITRATE 0.05 MG/ML IJ SOLN
25.0000 ug | INTRAMUSCULAR | Status: DC | PRN
Start: 2013-09-17 — End: 2013-10-28
  Administered 2013-09-18 – 2013-09-23 (×4): 25 ug via INTRAVENOUS
  Administered 2013-09-27 – 2013-09-28 (×4): 50 ug via INTRAVENOUS
  Administered 2013-09-28: 25 ug via INTRAVENOUS
  Administered 2013-09-29 – 2013-09-30 (×8): 50 ug via INTRAVENOUS
  Administered 2013-10-01 – 2013-10-07 (×5): 100 ug via INTRAVENOUS
  Administered 2013-10-07: 25 ug via INTRAVENOUS
  Administered 2013-10-08 (×3): 100 ug via INTRAVENOUS
  Administered 2013-10-10 (×2): 50 ug via INTRAVENOUS
  Administered 2013-10-10: 100 ug via INTRAVENOUS
  Administered 2013-10-10: 50 ug via INTRAVENOUS
  Administered 2013-10-11: 100 ug via INTRAVENOUS
  Administered 2013-10-11 – 2013-10-12 (×3): 50 ug via INTRAVENOUS
  Administered 2013-10-12 – 2013-10-13 (×6): 100 ug via INTRAVENOUS
  Administered 2013-10-14: 50 ug via INTRAVENOUS
  Administered 2013-10-14: 25 ug via INTRAVENOUS
  Administered 2013-10-14: 50 ug via INTRAVENOUS
  Administered 2013-10-14: 25 ug via INTRAVENOUS
  Administered 2013-10-14: 100 ug via INTRAVENOUS
  Administered 2013-10-15 (×2): 50 ug via INTRAVENOUS
  Administered 2013-10-15 – 2013-10-16 (×2): 100 ug via INTRAVENOUS
  Administered 2013-10-16 (×3): 50 ug via INTRAVENOUS
  Administered 2013-10-17 – 2013-10-20 (×9): 100 ug via INTRAVENOUS
  Administered 2013-10-20: 50 ug via INTRAVENOUS
  Administered 2013-10-21 (×4): 100 ug via INTRAVENOUS
  Administered 2013-10-22: 50 ug via INTRAVENOUS
  Administered 2013-10-22 – 2013-10-28 (×38): 100 ug via INTRAVENOUS
  Filled 2013-09-17 (×102): qty 2

## 2013-09-17 MED ORDER — MORPHINE SULFATE 2 MG/ML IJ SOLN: 2.0000 mg | INTRAMUSCULAR | Status: DC | PRN

## 2013-09-17 MED ORDER — ALBUTEROL SULFATE HFA 108 (90 BASE) MCG/ACT IN AERS
INHALATION_SPRAY | RESPIRATORY_TRACT | Status: AC
  Filled 2013-09-17: qty 6.7

## 2013-09-17 MED ORDER — MIDAZOLAM HCL 2 MG/2ML IJ SOLN
INTRAMUSCULAR | Status: AC
  Filled 2013-09-17: qty 2

## 2013-09-17 MED ORDER — ALTEPLASE 2 MG IJ SOLR: 2.0000 mg | Freq: Once | INTRAMUSCULAR | Status: AC | PRN

## 2013-09-17 MED ORDER — MIDAZOLAM HCL 5 MG/5ML IJ SOLN
INTRAMUSCULAR | Status: DC | PRN
  Administered 2013-09-17: 2 mg via INTRAVENOUS

## 2013-09-17 MED ORDER — ALBUTEROL SULFATE HFA 108 (90 BASE) MCG/ACT IN AERS
INHALATION_SPRAY | RESPIRATORY_TRACT | Status: DC | PRN
  Administered 2013-09-17: 4 via RESPIRATORY_TRACT

## 2013-09-17 MED ORDER — PHENYLEPHRINE 40 MCG/ML (10ML) SYRINGE FOR IV PUSH (FOR BLOOD PRESSURE SUPPORT)
PREFILLED_SYRINGE | INTRAVENOUS | Status: AC
  Filled 2013-09-17: qty 20

## 2013-09-17 MED ORDER — THROMBIN 20000 UNITS EX SOLR
CUTANEOUS | Status: AC
  Filled 2013-09-17: qty 20000

## 2013-09-17 MED ORDER — HEPARIN SODIUM (PORCINE) 1000 UNIT/ML DIALYSIS: 1000.0000 [IU] | INTRAMUSCULAR | Status: DC | PRN

## 2013-09-17 MED ORDER — LIDOCAINE HCL (PF) 1 % IJ SOLN: 5.0000 mL | INTRAMUSCULAR | Status: DC | PRN

## 2013-09-17 MED ORDER — OXYCODONE HCL 5 MG PO TABS
5.0000 mg | ORAL_TABLET | ORAL | Status: DC | PRN
  Administered 2013-09-19: 5 mg via ORAL
  Filled 2013-09-17: qty 1

## 2013-09-17 MED ORDER — ALBUMIN HUMAN 5 % IV SOLN
INTRAVENOUS | Status: DC | PRN
  Administered 2013-09-17 (×2): via INTRAVENOUS

## 2013-09-17 MED ORDER — FUROSEMIDE 10 MG/ML IJ SOLN
20.0000 mg | Freq: Once | INTRAMUSCULAR | Status: AC
  Administered 2013-09-17: 20 mg via INTRAVENOUS
  Filled 2013-09-17: qty 2

## 2013-09-17 MED ORDER — PHENYLEPHRINE HCL 10 MG/ML IJ SOLN
10.0000 mg | INTRAMUSCULAR | Status: DC | PRN
  Administered 2013-09-17: 60 ug/min via INTRAVENOUS

## 2013-09-17 MED ORDER — HEPARIN SODIUM (PORCINE) 1000 UNIT/ML IJ SOLN
INTRAMUSCULAR | Status: DC | PRN
  Administered 2013-09-17: 12000 [IU] via INTRAVENOUS

## 2013-09-17 MED ORDER — PROPOFOL 10 MG/ML IV BOLUS
INTRAVENOUS | Status: DC | PRN
  Administered 2013-09-17: 100 mg via INTRAVENOUS

## 2013-09-17 SURGICAL SUPPLY — 65 items
BAG SNAP BAND KOVER 36X36 (MISCELLANEOUS) ×3 IMPLANT
BANDAGE ELASTIC 6 VELCRO ST LF (GAUZE/BANDAGES/DRESSINGS) ×3 IMPLANT
BANDAGE ESMARK 6X9 LF (GAUZE/BANDAGES/DRESSINGS) IMPLANT
BANDAGE GAUZE ELAST BULKY 4 IN (GAUZE/BANDAGES/DRESSINGS) ×6 IMPLANT
BLADE 10 SAFETY STRL DISP (BLADE) ×3 IMPLANT
BNDG ESMARK 6X9 LF (GAUZE/BANDAGES/DRESSINGS)
CANISTER SUCTION 2500CC (MISCELLANEOUS) ×3 IMPLANT
CATH EMB 3FR 80CM (CATHETERS) IMPLANT
CATH EMB 4FR 80CM (CATHETERS) ×3 IMPLANT
CATH EMB 5FR 80CM (CATHETERS) IMPLANT
CLIP TI MEDIUM 24 (CLIP) ×3 IMPLANT
CLIP TI WIDE RED SMALL 24 (CLIP) ×3 IMPLANT
COVER DOME SNAP 22 D (MISCELLANEOUS) ×3 IMPLANT
COVER SURGICAL LIGHT HANDLE (MISCELLANEOUS) ×3 IMPLANT
CUFF TOURNIQUET SINGLE 24IN (TOURNIQUET CUFF) IMPLANT
CUFF TOURNIQUET SINGLE 34IN LL (TOURNIQUET CUFF) IMPLANT
CUFF TOURNIQUET SINGLE 44IN (TOURNIQUET CUFF) IMPLANT
DECANTER SPIKE VIAL GLASS SM (MISCELLANEOUS) IMPLANT
DERMABOND ADVANCED (GAUZE/BANDAGES/DRESSINGS) ×1
DERMABOND ADVANCED .7 DNX12 (GAUZE/BANDAGES/DRESSINGS) ×2 IMPLANT
DRAIN SNY 10X20 3/4 PERF (WOUND CARE) IMPLANT
DRAPE WARM FLUID 44X44 (DRAPE) ×3 IMPLANT
DRAPE X-RAY CASS 24X20 (DRAPES) IMPLANT
DRSG COVADERM 4X8 (GAUZE/BANDAGES/DRESSINGS) IMPLANT
ELECT REM PT RETURN 9FT ADLT (ELECTROSURGICAL) ×3
ELECTRODE REM PT RTRN 9FT ADLT (ELECTROSURGICAL) ×2 IMPLANT
EVACUATOR SILICONE 100CC (DRAIN) IMPLANT
GAUZE SPONGE 4X4 16PLY XRAY LF (GAUZE/BANDAGES/DRESSINGS) ×3 IMPLANT
GLOVE BIO SURGEON STRL SZ 6.5 (GLOVE) ×3 IMPLANT
GLOVE BIO SURGEON STRL SZ7.5 (GLOVE) ×3 IMPLANT
GLOVE BIOGEL PI IND STRL 6.5 (GLOVE) ×2 IMPLANT
GLOVE BIOGEL PI IND STRL 8 (GLOVE) ×4 IMPLANT
GLOVE BIOGEL PI INDICATOR 6.5 (GLOVE) ×1
GLOVE BIOGEL PI INDICATOR 8 (GLOVE) ×2
GLOVE ECLIPSE 7.5 STRL STRAW (GLOVE) ×3 IMPLANT
GOWN STRL REUS W/ TWL LRG LVL3 (GOWN DISPOSABLE) ×6 IMPLANT
GOWN STRL REUS W/TWL LRG LVL3 (GOWN DISPOSABLE) ×3
KIT BASIN OR (CUSTOM PROCEDURE TRAY) ×3 IMPLANT
KIT ROOM TURNOVER OR (KITS) ×3 IMPLANT
NS IRRIG 1000ML POUR BTL (IV SOLUTION) ×6 IMPLANT
PACK PERIPHERAL VASCULAR (CUSTOM PROCEDURE TRAY) ×3 IMPLANT
PAD ARMBOARD 7.5X6 YLW CONV (MISCELLANEOUS) ×6 IMPLANT
PADDING CAST COTTON 6X4 STRL (CAST SUPPLIES) IMPLANT
SET COLLECT BLD 21X3/4 12 (NEEDLE) IMPLANT
SPONGE GAUZE 4X4 12PLY STER LF (GAUZE/BANDAGES/DRESSINGS) ×9 IMPLANT
SPONGE SURGIFOAM ABS GEL 100 (HEMOSTASIS) IMPLANT
STAPLER VISISTAT 35W (STAPLE) IMPLANT
STOPCOCK 4 WAY LG BORE MALE ST (IV SETS) IMPLANT
SUT PROLENE 5 0 C 1 24 (SUTURE) ×3 IMPLANT
SUT PROLENE 6 0 CC (SUTURE) ×15 IMPLANT
SUT SILK 2 0 (SUTURE) ×1
SUT SILK 2-0 18XBRD TIE 12 (SUTURE) ×2 IMPLANT
SUT SILK 3 0 SH 30 (SUTURE) ×3 IMPLANT
SUT VIC AB 2-0 CTX 27 (SUTURE) ×3 IMPLANT
SUT VIC AB 2-0 CTX 36 (SUTURE) ×3 IMPLANT
SUT VIC AB 3-0 SH 27 (SUTURE) ×2
SUT VIC AB 3-0 SH 27X BRD (SUTURE) ×4 IMPLANT
SUT VICRYL 4-0 PS2 18IN ABS (SUTURE) ×6 IMPLANT
SYR 3ML LL SCALE MARK (SYRINGE) ×6 IMPLANT
TOWEL OR 17X24 6PK STRL BLUE (TOWEL DISPOSABLE) ×6 IMPLANT
TOWEL OR 17X26 10 PK STRL BLUE (TOWEL DISPOSABLE) ×6 IMPLANT
TRAY FOLEY CATH 16FRSI W/METER (SET/KITS/TRAYS/PACK) ×3 IMPLANT
TUBING EXTENTION W/L.L. (IV SETS) IMPLANT
UNDERPAD 30X30 INCONTINENT (UNDERPADS AND DIAPERS) ×3 IMPLANT
WATER STERILE IRR 1000ML POUR (IV SOLUTION) ×3 IMPLANT

## 2013-09-17 NOTE — Progress Notes (Signed)
Anesthesiology Note:  As noted Hannah [name] is a 43 year old female with ischemic cardiomyopathy, longstanding Type 2 DM, retinopathy morbid obesity, recent onset renal failure requiring hemodialysis. She recently required an IABP for cardiac support and suffered a thrombosis of her R. Femoral artery most likely due to IABP.  Today, she underwent R. Femoral endarterectomy, R. Femoral thrombectomy and R. Leg fasciotomies by Dr. Oneida Alar. General endotracheal anesthesia was necessary and intubation was moderately difficult requiring the glide scope. BP support with levophed was required and blood loss was approximatley 300 cc.   Last ABG in OR: FiO2 1.0 TV 550 rate 10 PEEP 5  PH 7.39 CO2 42.2 PO2 166 Hc03 25.6 02 99% HGB 9.2 iCa 1.10 K 3.6  Decision was made to keep her intubated post-op with precedex for sedation and levophed 3 mcg/min for BP support  Patient now stable in SICU. She may require HD in AM . I suspect she can be weaned and successfully extubated in tomorrow.  Roberts Gaudy, MD

## 2013-09-17 NOTE — Transfer of Care (Signed)
Immediate Anesthesia Transfer of Care Note  Patient: Hannah [name]  Procedure(s) Performed: Procedure(s): Thrombectomy of Right Common Femoral Artery (Right) Right Femoral Endarterectomy (Right) Vein Patch Angioplasty of Right Femoral Artery (Right) Four Compartment Fasciotomy (Right)  Patient Location: SICU  Anesthesia Type:General  Level of Consciousness: alert , sedated, patient cooperative, responds to stimulation and Patient remains intubated per anesthesia plan  Airway & Oxygen Therapy: Patient remains intubated per anesthesia plan and Patient placed on Ventilator (see vital sign flow sheet for setting)  Post-op Assessment: Report given to PACU RN and Post -op Vital signs reviewed and stable  Post vital signs: Reviewed and stable  Complications: No apparent anesthesia complications

## 2013-09-17 NOTE — Consult Note (Addendum)
PULMONARY / CRITICAL CARE MEDICINE   Name: Hannah [name] MRN: [id number] DOB: 1971-01-09    ADMISSION DATE:  09/03/2013 CONSULTATION DATE:  09/17/2013  REFERRING MD :  Irish Lack PRIMARY SERVICE: Cariology  CHIEF COMPLAINT:  respiratory failure  BRIEF PATIENT DESCRIPTION: 43 year old female with CKD III, CAD, IDDM, Ca, and PVD was admitted 5/18 after high risk stress test -anterior & apical ischemia.  Cardiac cath showed 3VD -felt not to be a good candidate for CABG. Underwent Swan & IABP insertion which then led to cold right foot and subsequent removal of IABP. She required  right fem endarterectomy, right fem thrombectomy, 4 compartment fasciotomy.  Pt returned to SICU on vent, PCCM consulted for vent management. EF 30-35% by echo  SIGNIFICANT EVENTS / STUDIES:  2013 LLL lobectomy secondary to stage 1 NSCC. 5/18 carotid dopplers >>> right 60-79% IC stenosis, let 80-99%. 5/18 Admitted after high risk abnormal stress test (anterior and apical ischemia with EF 22%) 5/19 Cardiac cath - Severe 3 vessel obstructive CAD, Elevated LV filling pressures 5/22 CRRT started 5/27 to OR for left IJ HD  permacath 5/28 RHC and IABP placement 5/29 loss of pulses in R foot, IABP d/c'd 5/30 LE arterial duplex >>> Right CFA appears occluded with no color Doppler signal or pulsed Doppler signal. 5/31 RUE venous duplex >>> no obvious DVT/SVT. 6/1 Right femoral endarterectomy, right femoral thrombectomy, four compartment fasciotomy - on vent post operatively   LINES / TUBES: R IJ TLC 5/21 >>> 5/21 R IJ HD TLC 5/21 >>> L IJ HD percath 5/27 >>> Right Luiz Blare 5/28 >>> 6/1?  CULTURES: None  ANTIBIOTICS: Cefuroxime 6/1 >>>   HISTORY OF PRESENT ILLNESS:  Pt is encephelopathic; therefore, this HPI is obtained from chart review. Hannah [name] is a 43 yo morbidly obese female with PMH of DM1, stage V kidney disease, blindness, h/o lung CA s/p LLL lobectomy in 2013, recently had a high risk stress test on 5/18  which showed ischemia in anterior and apical region and was subsequently admitted for further evaluation. She underwent cath 09/04/2013 which showed chronically occluded RCA and triple vessel dx. CT surgery consulted. EF 22% by Myoview. 30-35% by echo. Hospital course very complicated including significant volume overload requiring CRRT, development of cardiogenic shock requiring IABP placement.  After placement, she developed right foot ischemia requiring vascular surgery consult.  IABP was removed and pt was taken to OR on 6/1 for right femoral endarterectomy, right femoral thrombectomy, four compartment fasciotomy. She returned from the OR on the vent and PCCM was consulted for vent management.   PAST MEDICAL HISTORY :  Past Medical History  Diagnosis Date  . High cholesterol   . Diabetic retinopathy   . Peripheral neuropathy     "tips of toes"  . Blind right eye   . CHF (congestive heart failure)   . CAD (coronary artery disease)   . GERD (gastroesophageal reflux disease)   . Hypertension   . History of lung cancer 07/2011    s/p left lower lobectomy  . Diabetes mellitus     IDDM  . Cataract of right eye   . Ulcer of toe of right foot 07/10/2012    great toe  . Breast calcification, left 06/2012  . Acute biphenotypic leukemia   . CKD (chronic kidney disease) stage 5, GFR less than 15 ml/min   . Nephrotic syndrome   . Gastritis     H/o gastritis on prior endoscopy  . Anemia   . Carotid  artery disease    Past Surgical History  Procedure Laterality Date  . Cardiac catheterization  07/16/2011  . Incision and drainage breast abscess Left   . Tubal ligation  1994  . Vitrectomy  2010    2 on left, 1 on right  . Cesarean section  1991; 1994  . Video assisted thoracoscopy (vats)/ lobectomy Left 07/30/2011    left main thoracotomy, left lower lobectomy, mediastinal lymph node dissection  . Lobectomy    . Colonoscopy with esophagogastroduodenoscopy (egd) N/A 08/14/2012    Procedure:  COLONOSCOPY WITH ESOPHAGOGASTRODUODENOSCOPY (EGD);  Surgeon: Danie Binder, MD;  Location[facility];  Service: Endoscopy;  Laterality: N/A;  10:45-moved to [address] to notify pt  . Breast lumpectomy with needle localization Left 11/14/2012    Procedure: BREAST LUMPECTOMY WITH NEEDLE LOCALIZATION;  Surgeon: Marcello Moores A. Cornett, MD;  Location: [facility];  Service: General;  Laterality: Left;  . Insertion of dialysis catheter Left 09/12/2013    Procedure: INSERTION OF DIALYSIS CATHETER;  Surgeon: Mal Misty, MD;  Location: [facility];  Service: Vascular;  Laterality: Left;   Prior to Admission medications   Medication Sig Start Date End Date Taking? Authorizing Provider  acetaminophen (TYLENOL) 500 MG tablet Take 500 mg by mouth every 8 (eight) hours as needed for mild pain.   Yes Historical Provider, MD  aspirin EC 81 MG EC tablet Take 1 tablet (81 mg total) by mouth daily. 08/17/13  Yes Belkys A Regalado, MD  cholecalciferol (VITAMIN D) 1000 UNITS tablet Take 1,000 Units by mouth daily.   Yes Historical Provider, MD  cyclobenzaprine (FLEXERIL) 10 MG tablet Take 5-10 mg by mouth 3 (three) times daily as needed for muscle spasms. 07/06/13  Yes Alycia Rossetti, MD  Insulin Detemir (LEVEMIR FLEXPEN) 100 UNIT/ML Pen Inject 34 Units into the skin every evening. 08/17/13  Yes Belkys A Regalado, MD  insulin lispro (HUMALOG KWIKPEN) 100 UNIT/ML KiwkPen Inject 0.03 mLs (3 Units total) into the skin 3 (three) times daily with meals. 08/17/13  Yes Belkys A Regalado, MD  methylcellulose (ARTIFICIAL TEARS) 1 % ophthalmic solution Place 1 drop into both eyes at bedtime as needed (dry eyes).   Yes Historical Provider, MD  metolazone (ZAROXOLYN) 5 MG tablet Take 1 tablet (5 mg total) by mouth daily as needed. 08/17/13  Yes Belkys A Regalado, MD  metoprolol succinate (TOPROL-XL) 25 MG 24 hr tablet Take 1 tablet (25 mg total) by mouth daily. 07/08/13  Yes Alycia Rossetti, MD  potassium chloride SA (K-DUR,KLOR-CON) 20 MEQ  tablet Take 1 tablet (20 mEq total) by mouth 2 (two) times daily. 08/17/13  Yes Belkys A Regalado, MD  pravastatin (PRAVACHOL) 20 MG tablet Take 1 tablet (20 mg total) by mouth daily at 6 PM. 08/17/13  Yes Belkys A Regalado, MD  torsemide (DEMADEX) 20 MG tablet Take 2 tablets (40 mg total) by mouth 2 (two) times daily. 08/17/13  Yes Elmarie Shiley, MD   Allergies  Allergen Reactions  . Crestor [Rosuvastatin] Other (See Comments)    Severe muscle weakness  . Nsaids Other (See Comments)    Not allergic, "bad on my kidneys"  . Ciprofloxacin Rash    FAMILY HISTORY:  Family History  Problem Relation Age of Onset  . Coronary artery disease Father   . Asthma Father   . COPD Father   . Hypertension Father   . Hyperlipidemia Father   . Diabetes Father   . Congestive Heart Failure Father   .  Heart disease Father     before age 37  . Heart attack Father   . Peripheral vascular disease Father   . Hypertension Mother   . Hyperlipidemia Mother   . Diabetes Mother   . Cancer Mother   . Cancer Maternal Aunt      three aunts, bone, breast, ?  . Hypertension Brother   . Diabetes Brother   . Diabetes Sister   . Colon cancer Neg Hx   . Celiac disease Neg Hx   . Crohn's disease Neg Hx   . Ulcerative colitis Neg Hx   . Lung cancer Maternal Grandmother    SOCIAL HISTORY:  reports that she quit smoking about 2 years ago. She has never used smokeless tobacco. She reports that she does not drink alcohol or use illicit drugs.  REVIEW OF SYSTEMS:  Unable to complete as pt is encephalopathic.  SUBJECTIVE:  On vent postop.  Sedated but is able to follow some commands.  VITAL SIGNS: Temp:  [96.7 F (35.9 C)-99.1 F (37.3 C)] 97.9 F (36.6 C) (06/01 1800) Pulse Rate:  [95-127] 95 (06/01 1900) Resp:  [15-20] 16 (06/01 1900) BP: (83-114)/(46-70) 113/70 mmHg (06/01 1900) SpO2:  [98 %-100 %] 100 % (06/01 1900) FiO2 (%):  [40 %] 40 % (06/01 1725) Weight:  [118.7 kg (261 lb 11 oz)-119.1 kg (262 lb  9.1 oz)] 118.7 kg (261 lb 11 oz) (06/01 0812) HEMODYNAMICS: PAP: (39-41)/(35-38) 40/36 mmHg CVP:  [5 mmHg-17 mmHg] 5 mmHg PCWP:  [6.93 mmHg] 6.93 mmHg CO:  [3.1 L/min-6.5 L/min] 6.5 L/min CI:  [2.9 L/min/m2] 2.9 L/min/m2 VENTILATOR SETTINGS: Vent Mode:  [-] PRVC FiO2 (%):  [40 %] 40 % Set Rate:  [16 bmp] 16 bmp Vt Set:  [500 mL] 500 mL PEEP:  [5 cmH20] 5 cmH20 Plateau Pressure:  [17 cmH20] 17 cmH20 INTAKE / OUTPUT: Intake/Output     06/01 0701 - 06/02 0700   P.O. 60   I.V. (mL/kg) 773.6 (6.5)   IV Piggyback 600   Total Intake(mL/kg) 1433.6 (12.1)   Other 2397   Blood 500   Total Output 2897   Net -1463.4         PHYSICAL EXAMINATION: General: Obese female, sedate in bed on vent. Neuro: sedated. HEENT: [place]/AT. PERRL, sclerae anicteric. Cardiovascular: RRR, no M/R/G.  Lungs: Respirations even and unlabored on vent.  CTA bilaterally, No W/R/R.  Abdomen: BS x 4, soft, NT/ND.  Musculoskeletal: RLE dressings in place, C/D/I. 1+ edema LLE. Skin: Intact, warm, no rashes.    LABS:  CBC  Recent Labs Lab 09/15/13 0411 09/16/13 0447 09/17/13 0500 09/17/13 1339 09/17/13 1615  WBC 6.7 5.9 6.3  --   --   HGB 8.1* 7.4* 7.0* 8.8* 9.2*  HCT 25.0* 23.8* 22.5* 26.0* 27.0*  PLT 100* 90* 112*  --   --    Coag's  Recent Labs Lab 09/15/13 0411 09/16/13 0447 09/17/13 0500  APTT 35 42* 37   BMET  Recent Labs Lab 09/16/13 1600 09/17/13 0500 09/17/13 1339 09/17/13 1615 09/17/13 1730  NA 134* 131* 136* 133* 134*  K 3.8 4.1 3.0* 3.6* 4.1  CL 94* 92*  --   --  96  CO2 27 25  --   --  22  BUN 23 30*  --   --  16  CREATININE 4.16* 4.98*  --   --  3.15*  GLUCOSE 194* 270*  --   --  232*   Electrolytes  Recent Labs Lab 09/15/13 0411  09/16/13 0447 09/16/13 1600 09/17/13 0500 09/17/13 1730  CALCIUM  --   < > 7.4* 7.4* 7.5* 7.6*  MG 2.1  --  1.9  --  2.0  --   PHOS  --   < > 3.4 3.5 4.5 3.9  < > = values in this interval not displayed. Sepsis Markers No  results found for this basename: LATICACIDVEN, PROCALCITON, O2SATVEN,  in the last 168 hours ABG  Recent Labs Lab 09/17/13 1339 09/17/13 1615 09/17/13 1857  PHART 7.389 7.392 7.354  PCO2ART 38.9 42.2 31.2*  PO2ART 94.0 166.0* 107.0*   Liver Enzymes  Recent Labs Lab 09/12/13 0500  09/13/13 0428  09/16/13 1600 09/17/13 0500 09/17/13 1730  AST 19  --  12  --   --  10  --   ALT 17  --  14  --   --  6  --   ALKPHOS 72  --  66  --   --  66  --   BILITOT 0.3  --  0.2*  --   --  0.3  --   ALBUMIN 2.2*  < > 2.3*  < > 1.9* 1.8* 2.3*  < > = values in this interval not displayed. Cardiac Enzymes No results found for this basename: TROPONINI, PROBNP,  in the last 168 hours Glucose  Recent Labs Lab 09/16/13 0731 09/16/13 1230 09/16/13 1604 09/16/13 2138 09/17/13 0829 09/17/13 1135  GLUCAP 193* 241* 202* 275* 190* 128*    Imaging Dg Chest Port 1 View  09/16/2013   CLINICAL DATA:  Swan-Ganz position  EXAM: PORTABLE CHEST - 1 VIEW  COMPARISON:  Multiple prior studies  FINDINGS: There is a Swan-Ganz central venous line with tip in the distal right main pulmonary artery in the region of the right hilum. There is extensive right middle lobe consolidation. There is left lower lobe consolidation.  IMPRESSION: Swan-Ganz central line projects over the right hilum.   Electronically Signed   By: Skipper Cliche M.D.   On: 09/16/2013 15:31   Dg Chest Port 1 View  09/17/2013   CLINICAL DATA:  ETT placement  EXAM: PORTABLE CHEST - 1 VIEW  COMPARISON:  Chest radiograph 09/16/2013.  FINDINGS: Low lung volumes. Cardiac silhouette is enlarged. Endotracheal tube tip 7.6 cm above the carina projecting at the level of the clavicles. The patient's Swan-Ganz catheter has been removed. The left-sided central venous catheter stable. NG tube is appreciated tip not viewed on this study. Persistent left lower lobe consolidation. Decreased conspicuity of the right lower lobe density. There is prominence of the  interstitial markings. No acute osseous abnormalities.  IMPRESSION: Endotracheal tube adequately positioned. Interval insertion of an NG tube and removal of the patient's Swan-Ganz catheter. The left-sided dialysis catheter stable.  Improved pulmonary edema.  Persistent left lower lobe density   Electronically Signed   By: Margaree Mackintosh M.D.   On: 09/17/2013 20:40   Dg Chest Port 1 View  09/16/2013   CLINICAL DATA:  Swan-Ganz position  EXAM: PORTABLE CHEST - 1 VIEW  COMPARISON:  Multiple prior studies  FINDINGS: There is a Swan-Ganz central venous line with tip in the distal right main pulmonary artery in the region of the right hilum. There is extensive right middle lobe consolidation. There is left lower lobe consolidation.  IMPRESSION: Swan-Ganz central line projects over the right hilum.   Electronically Signed   By: Skipper Cliche M.D.   On: 09/16/2013 15:31      ASSESSMENT / PLAN:  PULMONARY A: VDRF S/p LLL lobectomy secondary to [place] Former smoker Severe COPD - Gold IV (PFT's 5/20: FEV1 28% pre BD, 36% post BD) P:   - Full vent support. - Goal pH > 7.30, SpO2 > 93%. - SBT in AM.with goal extubation -keep RR 16 to avoid autoPEEP  CARDIOVASCULAR A:  Cardiogenic Shock - now requiring levo and milrinone. Acute on chronic systolic heart failure Multivessel CAD- CVTS consulted - not a candidate for CABG due to poor lung function & shock Acute pulmonary edema/ volume overload P:  - Cards following. -taper levo gtt,  -keep milrinone on until shock resolved, follow co-ox daily   VASCULAR A: Right foot ischemia s/p  Right femoral endarterectomy, right femoral thrombectomy, four compartment fasciotomy Carotid stenosis - doppler 08/31/2013 shows R 60-79% ICA stenosis, L 80-99% ICA stenosis P: - Vascular following RLE , guarded prognosis for RLE  RENAL A:  ESRD - on CVVHD lt permacath P:   - Nephrology following. -HD once off pressors - will need volume off to facilitate  extubation  GASTROINTESTINAL A:   Obesity P:   - NPO for now. - Pantoprazole.  HEMATOLOGIC A:   Anemia P:  - Goal Hgb > 8 given cardiac hx. - SCD's. - Manage per primary.  INFECTIOUS A:  No acute issues. P:   - Monitor for fever / leukocytosis.  ENDOCRINE A:   DM P:   - SSI.  NEUROLOGIC A:   Acute Encephalopathy P:   - On Precedex for sedation. -fent prn - Goal RASS 0 to - 1. - Daily WUA.   Montey Hora, PA - [facility] Pgr: [phone]  or [phone]    Summary - s/p revascularisation & fasciotomy to salvage RLE, hope to extubate in am  I have personally obtained a history, examined the patient, evaluated laboratory and imaging results, formulated the assessment and plan and placed orders. CRITICAL CARE: The patient is critically ill with multiple organ systems failure and requires high complexity decision making for assessment and support, frequent evaluation and titration of therapies, application of advanced monitoring technologies and extensive interpretation of multiple databases. Critical Care Time devoted to patient care services described in this note is 60_ minutes.    Kara Mead MD. Shade Flood. [facility] Pager [phone] If no response call [phone]    09/17/2013, 7:52 PM

## 2013-09-17 NOTE — Anesthesia Preprocedure Evaluation (Addendum)
Anesthesia Evaluation  Patient identified by MRN, date of birth, ID band Patient awake    Reviewed: Allergy & Precautions, H&P , NPO status , Patient's Chart, lab work & pertinent test results, reviewed documented beta blocker date and time   Airway Mallampati: IV      Dental   Pulmonary former smoker,          Cardiovascular hypertension, Pt. on home beta blockers + angina + CAD, + Peripheral Vascular Disease and +CHF     Neuro/Psych    GI/Hepatic   Endo/Other  diabetes, Insulin Dependent  Renal/GU DialysisRenal disease     Musculoskeletal   Abdominal   Peds  Hematology  (+) anemia ,   Anesthesia Other Findings   Reproductive/Obstetrics                          Anesthesia Physical Anesthesia Plan  ASA: IV  Anesthesia Plan: General   Post-op Pain Management:    Induction: Intravenous  Airway Management Planned: Oral ETT  Additional Equipment: Arterial line and Ultrasound Guidance Line Placement  Intra-op Plan:   Post-operative Plan: Post-operative intubation/ventilation  Informed Consent: I have reviewed the patients History and Physical, chart, labs and discussed the procedure including the risks, benefits and alternatives for the proposed anesthesia with the patient or authorized representative who has indicated his/her understanding and acceptance.   Dental advisory given  Plan Discussed with: CRNA and Anesthesiologist  Anesthesia Plan Comments:         Anesthesia Quick Evaluation

## 2013-09-17 NOTE — Consult Note (Signed)
Asked to see pt at the request of my partner Dr Trula Slade.  Pt with significant cardiac history with known 3 vessel coronary disease and new onset renal failure.  She developed thrombosis of her right femoral artery approximately 72 hours ago most likely secondary to a balloon pump.  She has had no doppler signals in her foot for 72 hours.  Her right foot is insensate to the ankle.  She is able to evert and invert the ankle some but unable to plantar or dorsiflex it all on my exam.  She is at very high risk of limb loss with an intervention at this point or not due to her prolonged ischemia time.  I discussed this with the patient and her family.  She is also very high risk for significant cardiac events or mortality.   Will proceed with right femoral thrombectomy possible endarterectomy and possible but most likely fasciotomy.  Current hemoglobin is 7.  Will transfuse with known CAD and expected blood loss.  Ruta Hinds, MD [facility] Office: [phone] Pager: [phone]

## 2013-09-17 NOTE — Progress Notes (Signed)
ADVANCED HEART FAILURE ROUNDING NOTE   SUBJECTIVE  43 yo morbidly obese female with PMH of DM1, stage V kidney disease, blindness, h/o lung CA s/p resection who was recently discharged for heart failure symptom had a high risk stress test which showed ischemia in anterior and apical region. She underwent cath 09/04/2013 which showed chronically occluded RCA and triple vessel dx. CT surgery consulted but not felt to be surgical candidate due to poor PFTs, comorbidities and shock. EF 22% by Myoview. 30-35% by echo  Underwent placement of swan and IABP last week for shock (co-ox 38%) last week. Unfortunately developed cold foot and IABP had to be removed. Now with threatened RLE limb loss. Getting intermittent HD and tolerating it. However catheter clotting at times.   Co-ox 60%.  Swan numbers CVP 15 PA 40/36       CURRENT MEDS . aspirin EC  81 mg Oral Daily  . atorvastatin  80 mg Oral q1800  . calcitRIOL  0.25 mcg Oral Daily  . darbepoetin (ARANESP) injection - NON-DIALYSIS  200 mcg Subcutaneous Q Wed-1800  . fentaNYL  25 mcg Intravenous Once  . fentaNYL  50 mcg Intravenous Once  . heparin  40 Units/kg Dialysis Once in dialysis  . insulin aspart  0-9 Units Subcutaneous TID WC  . insulin aspart  3 Units Subcutaneous TID WC  . insulin detemir  10 Units Subcutaneous q morning - 10a  . insulin detemir  34 Units Subcutaneous QHS  . lidocaine (PF)  5 mL Other Once  . metoprolol  2.5 mg Intravenous Once  . midazolam  2 mg Intravenous Once  . midazolam  2 mg Intravenous Once  . sodium chloride  10-40 mL Intracatheter Q12H  . sodium chloride  3 mL Intravenous Q12H  . sodium chloride  3 mL Intravenous Q12H    OBJECTIVE  Filed Vitals:   09/17/13 0500 09/17/13 0600 09/17/13 0700 09/17/13 0800  BP: 102/62 96/64 97/65  99/66  Pulse: 125 122 121 122  Temp: 98.2 F (36.8 C) 98.1 F (36.7 C) 98.1 F (36.7 C) 98.1 F (36.7 C)  TempSrc:  Core (Comment)  Core (Comment)  Resp:       Height:      Weight: 119.1 kg (262 lb 9.1 oz)     SpO2: 100% 100% 100% 100%    Intake/Output Summary (Last 24 hours) at 09/17/13 0828 Last data filed at 09/17/13 0800  Gross per 24 hour  Intake 1538.8 ml  Output      0 ml  Net 1538.8 ml   Filed Weights   09/15/13 0730 09/16/13 0600 09/17/13 0500  Weight: 122.1 kg (269 lb 2.9 oz) 118.7 kg (261 lb 11 oz) 119.1 kg (262 lb 9.1 oz)    PHYSICAL EXAM  General:Lying in bed; weak. On CVVHD Neuro: Alert and oriented X 3. Moves all extremities spontaneous HEENT:  Normal  Neck: Supple without bruits. Chest L subclav PC. R Elroy swan Lungs:  clear Heart: tachy regular. +s3 Abdomen: Soft, non-tender, non-distended, BS + x 4.  Extremities: Mild edema. R foot cool decreased sensation and motor strength. No femoral of DP pulse palpable on R. R arm swelling (no DVT by u/s)   CBC  Recent Labs  09/16/13 0447 09/17/13 0500  WBC 5.9 6.3  HGB 7.4* 7.0*  HCT 23.8* 22.5*  MCV 97.1 97.0  PLT 90* 166*   Basic Metabolic Panel  Recent Labs  09/16/13 0447 09/16/13 1600 09/17/13 0500  NA 137 134* 131*  K  3.8 3.8 4.1  CL 98 94* 92*  CO2 27 27 25   GLUCOSE 253* 194* 270*  BUN 18 23 30*  CREATININE 3.19* 4.16* 4.98*  CALCIUM 7.4* 7.4* 7.5*  MG 1.9  --  2.0  PHOS 3.4 3.5 4.5   Liver Function Tests  Recent Labs  09/16/13 1600 09/17/13 0500  AST  --  10  ALT  --  6  ALKPHOS  --  66  BILITOT  --  0.3  PROT  --  4.8*  ALBUMIN 1.9* 1.8*   Cardiac Enzymes No results found for this basename: CKTOTAL, CKMB, CKMBINDEX, TROPONINI,  in the last 72 hours Thyroid Function Tests No results found for this basename: TSH, T4TOTAL, FREET3, T3FREE, THYROIDAB,  in the last 72 hours  TELE  Sinus tachycardia 120-130s ECG  Sinus tachycardia with HR 107 and poor R wave progression in anterior leads  Radiology/Studies  Dg Chest 2 View  09/06/2013   CLINICAL DATA:  CHF, intermittent dyspnea  EXAM: CHEST  2 VIEW  COMPARISON:  Prior chest x-ray  08/13/2013; prior chest CT 08/22/2013  FINDINGS: Stable cardiac and mediastinal contours. Atherosclerotic calcifications are present within the transverse aorta. Moderate left and small right pleural effusions are similar compared to prior. There is persistent associated bibasilar atelectasis. Mild pulmonary vascular congestion without overt edema. No pneumothorax. No new focal airspace consolidation. No acute osseous abnormality.  IMPRESSION: 1. Stable moderate left and small right pleural effusions and associated bibasilar atelectasis. 2. Pulmonary vascular congestion without overt edema.   Electronically Signed   By: Jacqulynn Cadet M.D.   On: 09/06/2013 07:55   Ct Chest Wo Contrast  08/22/2013   CLINICAL DATA:  Left lung cancer status post lobectomy. Weight gain. Renal insufficiency.  EXAM: CT CHEST WITHOUT CONTRAST  TECHNIQUE: Multidetector CT imaging of the chest was performed following the standard protocol without IV contrast.  COMPARISON:  Radiographs 08/13/2013 and 08/04/2013.  CT 08/02/2012.  FINDINGS: There are stable postsurgical changes related to prior left lower lobe resection. There has been interval enlargement of several mediastinal lymph nodes. These include 12 mm right paratracheal (image 19), 13 mm precarinal (image 23), and 14 mm AP window (image 25) lymph nodes. Some of these nodes have retained fatty hila. Allowing for the limitations of noncontrast technique, the hila appear stable.  There is stable low-density within the right thyroid lobe. Atherosclerosis of the aorta, great vessels and coronary arteries is noted. The heart size is normal. There is no pericardial effusion.  Moderate size dependent pleural effusions are present bilaterally. There is associated dependent airspace disease in both lower lobes. In addition, there are more focal airspace opacities within the right lower lobe and inferior aspect of the left upper lobe. The latter is somewhat nodular, measuring up to 1.8 cm  on image 43. No other focal nodularity or endobronchial lesions are demonstrated.  The visualized upper abdomen has a stable appearance. There is no adrenal mass. There is increased subcutaneous edema throughout the subcutaneous fat, especially within the anterior aspect of the upper abdomen.  No worrisome osseous findings are demonstrated.  IMPRESSION: 1. As demonstrated radiographically, there are bilateral pleural effusions and bilateral airspace opacities which are new compared with the prior CT. Although there are focal somewhat nodular components in the right lower and left upper lobes, these findings are most likely infectious/inflammatory. 2. Progressive mediastinal lymphadenopathy. Some of the nodes have retained fatty hila and may be reactive. Metastatic disease cannot be completely excluded. 3. Stable atherosclerosis  and low-density within the right thyroid lobe. 4. If the airspace opacities and pleural effusions fail to respond to appropriate clinical therapy, follow-up CT or PET-CT may be warranted to exclude metastatic disease.   Electronically Signed   By: Camie Patience M.D.   On: 08/22/2013 10:26   Dg Chest Portable 1 View  08/13/2013   CLINICAL DATA:  Shortness of breath increasing for 2 weeks. History of CHF.  EXAM: PORTABLE CHEST - 1 VIEW  COMPARISON:  08/04/2013  FINDINGS: Again noted are bibasilar lung densities that are suggestive for pleural effusions and atelectasis. Heart size appears to be enlarged. There is some peribronchial thickening and cannot exclude pulmonary edema.  IMPRESSION: Bilateral pleural effusions with basilar atelectasis. There is mild pulmonary edema. Minimal change from the previous examination.   Electronically Signed   By: Markus Daft M.D.   On: 08/13/2013 23:10    ASSESSMENT AND PLAN 1. Cardiogenic shock 2. Acute on chronic systolic HF     EF ~30-07% 3. 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 Acute respiratory failure 5. 3-V CAD 6. Carotid stenosis     - doppler 08/31/2013  shows R 60-79% ICA stenosis, L 80-99% ICA stenosis     - vascular surgery aware 7. 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 DM1 9. Ischemic R foot  Extremely difficult situation. She has cardiogenic shock and is now milrinone dependent. Fortunately co-ox now improved and she is tolerating intermittent HD. I reviewed cath films with TCTS and interventional team and she has diffuse moderate to severe CAD without clear target for intervention that will benefit her. Her EF is low and FEV1 < 1.0L in setting of previous lung surgery. The only option here is medical therapy and we will need to see if she can continue to tolerate HD. If not we will have reached a palliative situation. Fortunately she seems to be tolerating so far. If we cannot get her off milrinone finding an HD center that will treat her on milrinone maybe a challenge. Swan catheter removed may need to remove cordis if RUE swelling persists.   With regard to her leg, I spoke to Dr. Trula Slade this am and agree we need to move as quickly as possible. Ideally she will respond to a thrombectomy. Given her condition she is at very high surgical risk and I would hope we can avoid a fasciotomy today unless absolutely necessary. She is aware that she may need to go back to the OR for this or even an amputation. We have made her NPO.  Long talk with her, her husband and mother-in-law about overall situation and the fact that we may not be able to get her out of the hospital. I went over all the issues regarding her HF, CAD, ESRD and PAD. All questions answered.   The patient is critically ill with multiple organ systems failure and requires high complexity decision making for assessment and support, frequent evaluation and titration of therapies, application of advanced monitoring technologies and extensive interpretation of multiple databases.    Critical Care Time devoted to patient care services described in this note is 60 Minutes.  Shaune Pascal Bensimhon,MD 8:28 AM

## 2013-09-17 NOTE — Procedures (Signed)
I was present at this dialysis session. I have reviewed the session itself and made appropriate changes.   Tolerating treatment.  Qb 400. Goal UF 2.5L.  No c/o.  Noted plans for today.   Pearson Grippe  MD 09/17/2013, 9:22 AM

## 2013-09-17 NOTE — Anesthesia Postprocedure Evaluation (Signed)
  Anesthesia Post-op Note  Patient: Hannah [name]  Procedure(s) Performed: Procedure(s): Thrombectomy of Right Common Femoral Artery (Right) Right Femoral Endarterectomy (Right) Vein Patch Angioplasty of Right Femoral Artery (Right) Four Compartment Fasciotomy (Right)  Patient Location: SICU  Anesthesia Type:General  Level of Consciousness: sedated and Patient remains intubated per anesthesia plan  Airway and Oxygen Therapy: Patient remains intubated per anesthesia plan and Patient placed on Ventilator (see vital sign flow sheet for setting)  Post-op Pain: none  Post-op Assessment: Post-op Vital signs reviewed, Patient's Cardiovascular Status Stable, Respiratory Function Stable and Patent Airway  Post-op Vital Signs: stable  Last Vitals:  Filed Vitals:   09/17/13 1900  BP: 113/70  Pulse: 95  Temp:   Resp: 16    Complications: No apparent anesthesia complications

## 2013-09-17 NOTE — Op Note (Signed)
Procedure: Right femoral endarterectomy, right femoral thrombectomy, four compartment fasciotomy  Preoperative diagnosis: Acute ischemia right foot  Postoperative diagnosis: Same  Anesthesia: General  Asst.: Adele Barthel, MD, Leontine Locket, PA-C  Indications: Patient is a 43year-old female who had a right femoral balloon pump placed last week.  The balloon pump was removed approximately 96 hr ago.  She has had no doppler signals in her foot since that time.  I was asked today to address ischemia in her right foot.  Risks benefits possible complications and procedure details including but not limited to bleeding, infection, myocardial events, limb loss, death were explained to the patient preoperatively. She expressed good understanding of these and agreed to proceed.  The patient's husband and mother in law were also present for this conversation.  Operative findings:      #1 vein patch right femoral endarterectomy     #2  Viable muscle        #3  PT and DP doppler right foot at conclusion of case  Operative details: After obtaining informed consent, the patient was taken to the operating room. The patient was placed in supine position on the operating room table. After induction of general anesthesia and endotracheal intubation, the patient's entire right lower extremity was prepped and draped in the usual sterile fashion. An oblique incision was then made in the right groin and carried down through the subcutaneous tissues to expose the right common femoral artery. The common femoral artery was dissected free circumferentially. There was a large amount of inflamed tissue and hematoma which made dissection fairly tedious.  There was no pulse within the common femoral artery. The distal external iliac artery was dissected free circumferentially underneath the inguinal ligament.  This was fairly difficult due to the patients obesity and large pannus. Several small circumflex iliac branches were  dissected free circumferentially and ligated and divided between silk ties. A vessel loop was also placed around the distal external iliac artery. Dissection was then carried out the level of the femoral bifurcation. The superficial femoral and profunda femoris arteries were dissected free circumferentially and vessel loops placed around these. Next the right greater saphenous vein was dissected free in the medial portion of the incision.  It was of good quality 4-5 mm diameter.  Seveal side branches were ligated and divided between silk ties.  An additional incision was placed in the medial thigh just distal to the groin incision to assist in harvesting the vein.    The patient was given 12000 units of intravenous heparin.  After appropriate circulation time of the heparin, the distal right external iliac artery was controlled with a small Cooley clamp. The profunda femoris and superficial femoral arteries were controlled with Vesseloops. A longitudinal opening was made in the common femoral artery just above the femoral bifurcation. There was fresh thrombus at the level of the femoral bifurcation but this was chronic enough that it had become somewhat adherent to the vessel wall.  There was some posterior plaque just above the femoral bifurcation which narrowed the SFA distal common and profunda somewhat.  The arteriotomy was extended proximally to get past the thrombus which was up under the inguinal ligament again making dissection fairly difficult.  #4 Fogarty catheters was then used to thrombectomize the proximal external iliac profunda and SFA.  The arterial inflow was good.  There was good backbleeding from the SFA and profunda. I was able to pass the catheter to approximately 20 cm mark in each profunda branch. Several  attempts were made to pass the 4 catheter down the superficial femoral artery. I was able to pass the catheter for the full length each time.  Two clean passes were obtained.  At this point  the branches were recontrolled and a femoral endarterectomy was performed extending from the distal common femoral into the profunda and SFA origin.   The endarterectomy was then carried retrograde back up into the common femoral artery and into the distal external iliac artery until a reasonable proximal endpoint was obtained. A good distal endpoint was also obtained.  All loose debris was removed from the arterial bed.  The segment of vein that had been dissected previously was then ligated distally with a 2 0 silk tie and oversewn a the saphenofemoral junction with a 5 0 prolene.  The vein was then reversed and opened longitudinally.  This was sewn on as a patch angioplasty extending down the common femoral artery onto the superficial femoral artery a few centimeters.  Just prior to completion anastomosis everything was forebled backbled and thoroughly flushed. Proximal clamp was removed and the vessel loops relaxed and there was good pulsatile flow in the profunda and superficial femoral artery immediately. Doppler was used to evaluate the fundus and there was good biphasic flow. Several repair sutures were placed to obtain hemostasis.  The patient had monophasic to biphasic Doppler flow in the posterior tibial and dorsalis pedis areas of the foot. The groin was packed and attention turned to the lower leg.  A four compartment fasciotomy was performed due to the patient's prolonged ischemia.  A longitudinal incision was made on the medial and lateral leg.  The posterior superficial and deep fascia was decompressed via the medial incision and the lateral and anterior compartments decompressed via the lateral incision.  The muscle was pink and contractile with cautery.  After hemostasis was obtained, the deep layers and subcutaneous layers of the groin and saphenectomy incision were closed with running 2 0 and 3-0 Vicryl suture. The skin was closed with a 4-0 Vicryl subcuticular stitch. The fasciotomies were  packed open with saline moistened gauze The patient tolerated the procedure well and there were no complications. Instrument sponge and needle counts correct in the case. Patient was taken to the ICU in critical but in stable condition on a ventilator.  Ruta Hinds, MD [facility] Office: [phone] Pager: [phone]

## 2013-09-17 NOTE — Progress Notes (Signed)
    Subjective  -  Events of weekend noted. No significant change this am   Physical Exam:  No audible doppler signals in right foot Decreased sensation and motor function in right foot Right groin tender, can not feel femoral pulse       Assessment/Plan:    This is a very difficult situation for this pleasant female.  She clearly has a threatened right foot.  This is possibly from the balloon pump that is now out.  It is unclear weather this is acute thrombus vs plaque.  Regardless without revascularization, she has a high chance of limb loss.  Because of her cardiac status, she is at risk for general anesthesia.  I could attempt to do this under local with IV sedation, however there is a risk of needing to convert to general anesthesia if she does not tolerate it.  I will discuss this further with Dr. Haroldine Laws this am.  [facility] 09/17/2013 8:12 AM --  Danley Danker Vitals:   09/17/13 0800  BP: 99/66  Pulse: 122  Temp: 98.1 F (36.7 C)  Resp:     Intake/Output Summary (Last 24 hours) at 09/17/13 0812 Last data filed at 09/17/13 0800  Gross per 24 hour  Intake 1538.8 ml  Output      0 ml  Net 1538.8 ml     Laboratory CBC    Component Value Date/Time   WBC 6.3 09/17/2013 0500   HGB 7.0* 09/17/2013 0500   HCT 22.5* 09/17/2013 0500   PLT 112* 09/17/2013 0500    BMET    Component Value Date/Time   NA 131* 09/17/2013 0500   K 4.1 09/17/2013 0500   CL 92* 09/17/2013 0500   CO2 25 09/17/2013 0500   GLUCOSE 270* 09/17/2013 0500   BUN 30* 09/17/2013 0500   CREATININE 4.98* 09/17/2013 0500   CREATININE 4.17* 07/06/2013 1618   CALCIUM 7.5* 09/17/2013 0500   GFRNONAA 10* 09/17/2013 0500   GFRNONAA 71 07/13/2011 0835   GFRAA 11* 09/17/2013 0500   GFRAA 82 07/13/2011 0835    COAG Lab Results  Component Value Date   INR 1.13 09/06/2013   INR 1.09 09/03/2013   INR 0.89 12/04/2012   No results found for this basename: PTT    Antibiotics Anti-infectives   Start     Dose/Rate Route  Frequency Ordered Stop   09/12/13 0000  ceFAZolin (ANCEF) IVPB 1 g/50 mL premix    Comments:  Send with pt to OR   1 g 100 mL/hr over 30 Minutes Intravenous On call 09/11/13 1055 09/12/13 0100       V. Leia Alf, M.D. [facility] Office: [phone] Pager:  [phone]

## 2013-09-18 ENCOUNTER — Inpatient Hospital Stay (HOSPITAL_COMMUNITY): Payer: Medicare Other

## 2013-09-18 ENCOUNTER — Encounter (HOSPITAL_COMMUNITY): Payer: Self-pay | Admitting: Vascular Surgery

## 2013-09-18 DIAGNOSIS — J9601 Acute respiratory failure with hypoxia: Secondary | ICD-10-CM

## 2013-09-18 DIAGNOSIS — J9600 Acute respiratory failure, unspecified whether with hypoxia or hypercapnia: Secondary | ICD-10-CM

## 2013-09-18 LAB — TYPE AND SCREEN
ABO/RH(D): O POS
Antibody Screen: NEGATIVE
UNIT DIVISION: 0
UNIT DIVISION: 0
Unit division: 0

## 2013-09-18 LAB — MAGNESIUM: Magnesium: 1.8 mg/dL (ref 1.5–2.5)

## 2013-09-18 LAB — RENAL FUNCTION PANEL
ALBUMIN: 1.9 g/dL — AB (ref 3.5–5.2)
BUN: 25 mg/dL — AB (ref 6–23)
CO2: 21 mEq/L (ref 19–32)
Calcium: 7.9 mg/dL — ABNORMAL LOW (ref 8.4–10.5)
Chloride: 94 mEq/L — ABNORMAL LOW (ref 96–112)
Creatinine, Ser: 4.57 mg/dL — ABNORMAL HIGH (ref 0.50–1.10)
GFR calc Af Amer: 12 mL/min — ABNORMAL LOW (ref >90)
GFR calc non Af Amer: 11 mL/min — ABNORMAL LOW (ref >90)
GLUCOSE: 223 mg/dL — AB (ref 70–99)
PHOSPHORUS: 4.9 mg/dL — AB (ref 2.3–4.6)
Potassium: 4.1 mEq/L (ref 3.7–5.3)
Sodium: 132 mEq/L — ABNORMAL LOW (ref 137–147)

## 2013-09-18 LAB — GLUCOSE, CAPILLARY
GLUCOSE-CAPILLARY: 203 mg/dL — AB (ref 70–99)
Glucose-Capillary: 209 mg/dL — ABNORMAL HIGH (ref 70–99)
Glucose-Capillary: 185 mg/dL — ABNORMAL HIGH (ref 70–99)
Glucose-Capillary: 179 mg/dL — ABNORMAL HIGH (ref 70–99)

## 2013-09-18 LAB — BASIC METABOLIC PANEL
BUN: 20 mg/dL (ref 6–23)
CALCIUM: 7.6 mg/dL — AB (ref 8.4–10.5)
CO2: 21 mEq/L (ref 19–32)
CREATININE: 3.9 mg/dL — AB (ref 0.50–1.10)
Chloride: 94 mEq/L — ABNORMAL LOW (ref 96–112)
GFR calc Af Amer: 15 mL/min — ABNORMAL LOW (ref >90)
GFR, EST NON AFRICAN AMERICAN: 13 mL/min — AB (ref >90)
GLUCOSE: 245 mg/dL — AB (ref 70–99)
Potassium: 4 mEq/L (ref 3.7–5.3)
Sodium: 131 mEq/L — ABNORMAL LOW (ref 137–147)

## 2013-09-18 LAB — CBC
HEMATOCRIT: 27.3 % — AB (ref 36.0–46.0)
Hemoglobin: 8.7 g/dL — ABNORMAL LOW (ref 12.0–15.0)
MCH: 30.5 pg (ref 26.0–34.0)
MCHC: 31.9 g/dL (ref 30.0–36.0)
MCV: 95.8 fL (ref 78.0–100.0)
PLATELETS: 148 10*3/uL — AB (ref 150–400)
RBC: 2.85 MIL/uL — AB (ref 3.87–5.11)
RDW: 17.3 % — ABNORMAL HIGH (ref 11.5–15.5)
WBC: 7.6 10*3/uL (ref 4.0–10.5)

## 2013-09-18 LAB — CARBOXYHEMOGLOBIN
Carboxyhemoglobin: 2.1 % — ABNORMAL HIGH (ref 0.5–1.5)
METHEMOGLOBIN: 0.6 % (ref 0.0–1.5)
O2 SAT: 66.5 %
TOTAL HEMOGLOBIN: 8.8 g/dL — AB (ref 12.0–16.0)

## 2013-09-18 LAB — APTT: aPTT: 37 seconds (ref 24–37)

## 2013-09-18 LAB — PHOSPHORUS: Phosphorus: 4.9 mg/dL — ABNORMAL HIGH (ref 2.3–4.6)

## 2013-09-18 MED ORDER — DEXMEDETOMIDINE HCL IN NACL 400 MCG/100ML IV SOLN
0.4000 ug/kg/h | INTRAVENOUS | Status: DC
  Administered 2013-09-18: 0.7 ug/kg/h via INTRAVENOUS
  Filled 2013-09-18 (×3): qty 100

## 2013-09-18 MED ORDER — ASPIRIN 81 MG PO CHEW
81.0000 mg | CHEWABLE_TABLET | Freq: Every day | ORAL | Status: DC
  Administered 2013-09-18 – 2013-10-29 (×41): 81 mg via ORAL
  Filled 2013-09-18 (×43): qty 1

## 2013-09-18 MED ORDER — BIOTENE DRY MOUTH MT LIQD
15.0000 mL | Freq: Two times a day (BID) | OROMUCOSAL | Status: DC
  Administered 2013-09-18 – 2013-09-23 (×10): 15 mL via OROMUCOSAL

## 2013-09-18 MED ORDER — PANTOPRAZOLE SODIUM 40 MG PO PACK
40.0000 mg | PACK | Freq: Every day | ORAL | Status: DC
  Administered 2013-09-18: 40 mg
  Filled 2013-09-18 (×4): qty 20

## 2013-09-18 MED ORDER — CALCITRIOL 1 MCG/ML PO SOLN
0.2500 ug | Freq: Every day | ORAL | Status: DC
  Administered 2013-09-18 – 2013-09-24 (×7): 0.25 ug via ORAL
  Filled 2013-09-18 (×9): qty 0.25

## 2013-09-18 MED ORDER — BIOTENE DRY MOUTH MT LIQD
15.0000 mL | Freq: Four times a day (QID) | OROMUCOSAL | Status: DC
  Administered 2013-09-18: 15 mL via OROMUCOSAL

## 2013-09-18 MED ORDER — CHLORHEXIDINE GLUCONATE 0.12 % MT SOLN
15.0000 mL | Freq: Two times a day (BID) | OROMUCOSAL | Status: DC
  Administered 2013-09-18: 15 mL via OROMUCOSAL
  Filled 2013-09-18: qty 15

## 2013-09-18 NOTE — Consult Note (Signed)
Admit: 09/03/2013 LOS: 13  34V acute systolic HF 2/2 ischemia (not current CABG candidate), milrinone dependent, and AoCKD now dialysis dependent via TDC.  Developed RLE arterial clot in setting of IABP req R fem endarterectomy, thrombectomy and fasciotomy 09/17/13.  Background DM1  Subjective:  Endarterectomy, thrombectomy, and fasciotomy yesterday, regained pulses Intubated post procedur On low dose of NE Remains on milrinone   06/01 0701 - 06/02 0700 In: 2015.5 [P.O.:60; I.V.:1355.5; IV Piggyback:600] Out: 2897 [Blood:500]  Filed Weights   09/17/13 0500 09/17/13 0812 09/18/13 0500  Weight: 119.1 kg (262 lb 9.1 oz) 118.7 kg (261 lb 11 oz) 126 kg (277 lb 12.5 oz)    Current meds: reviewed including aranesp 200 qWed Current Labs: reviewed    Physical Exam:  Blood pressure 124/65, pulse 87, temperature 97.4 F (36.3 C), temperature source Oral, resp. rate 19, height 5' 7.5" (1.715 m), weight 126 kg (277 lb 12.5 oz), last menstrual period 08/19/2013, SpO2 100.00%. Intubated, awake RRR Coarse bs b/l Abd s/nt mae  Assessment 1. Dialysis dep't AoCKD5 (Preadmit BL 4.82) 2. Acute Systolic HF on Milrione 3. R femoral clot s/p endarterectomy, fasciotomy, thrombectomy 09/17/13 4. Anemia on ESA qWed 200 Aranesp 5. VDRF 6. Carotid stenosis 7. Hx/o lung cancer and LLL lobectomy  8. DM1   Plan 1. Tentative next HD 09/19/13.  Lytes ok.  Remains on low dose NE so UF would be challenging today.    Pearson Grippe MD 09/18/2013, 9:32 AM   Recent Labs Lab 09/17/13 0500  09/17/13 1615 09/17/13 1730 09/18/13 0430  NA 131*  < > 133* 134* 131*  K 4.1  < > 3.6* 4.1 4.0  CL 92*  --   --  96 94*  CO2 25  --   --  22 21  GLUCOSE 270*  --   --  232* 245*  BUN 30*  --   --  16 20  CREATININE 4.98*  --   --  3.15* 3.90*  CALCIUM 7.5*  --   --  7.6* 7.6*  PHOS 4.5  --   --  3.9 4.9*  < > = values in this interval not displayed.  Recent Labs Lab 09/16/13 0447 09/17/13 0500 09/17/13 1339  09/17/13 1615 09/18/13 0430  WBC 5.9 6.3  --   --  7.6  HGB 7.4* 7.0* 8.8* 9.2* 8.7*  HCT 23.8* 22.5* 26.0* 27.0* 27.3*  MCV 97.1 97.0  --   --  95.8  PLT 90* 112*  --   --  148*

## 2013-09-18 NOTE — Progress Notes (Signed)
ADVANCED HEART FAILURE ROUNDING NOTE   SUBJECTIVE  43 yo morbidly obese female with PMH of DM1, stage V kidney disease, blindness, h/o lung CA s/p resection who was recently discharged for heart failure symptom had a high risk stress test which showed ischemia in anterior and apical region. She underwent cath 09/04/2013 which showed chronically occluded RCA and triple vessel dx. CT surgery consulted but not felt to be surgical candidate due to poor PFTs, comorbidities and shock. EF 22% by Myoview. 30-35% by echo  Underwent placement of swan and IABP last week for shock (co-ox 38%) last week. Unfortunately developed cold foot and IABP had to be removed.Getting intermittent HD and tolerating it.   Underwent R femoral thrombectomy and 4 compartment fasciotomy of RLE on 6/1. Awake on vent. Wants tube out. Co-ox 67%       CURRENT MEDS . antiseptic oral rinse  15 mL Mouth Rinse QID  . aspirin  81 mg Oral Daily  . atorvastatin  80 mg Oral q1800  . calcitRIOL  0.25 mcg Oral Daily  . chlorhexidine  15 mL Mouth Rinse BID  . darbepoetin (ARANESP) injection - NON-DIALYSIS  200 mcg Subcutaneous Q Wed-1800  . fentaNYL  25 mcg Intravenous Once  . fentaNYL  50 mcg Intravenous Once  . insulin aspart  0-9 Units Subcutaneous TID WC  . insulin aspart  3 Units Subcutaneous TID WC  . insulin detemir  10 Units Subcutaneous q morning - 10a  . insulin detemir  34 Units Subcutaneous QHS  . lidocaine (PF)  5 mL Other Once  . metoprolol  2.5 mg Intravenous Once  . pantoprazole sodium  40 mg Per Tube Daily  . sodium chloride  10-40 mL Intracatheter Q12H  . sodium chloride  3 mL Intravenous Q12H  . sodium chloride  3 mL Intravenous Q12H    OBJECTIVE  Filed Vitals:   09/18/13 1130 09/18/13 1136 09/18/13 1145 09/18/13 1200  BP: 130/83  134/76 123/70  Pulse: 111     Temp:  98.1 F (36.7 C)    TempSrc:  Oral    Resp: 16  14 12   Height:      Weight:      SpO2: 96%   100%    Intake/Output Summary  (Last 24 hours) at 09/18/13 1408 Last data filed at 09/18/13 1200  Gross per 24 hour  Intake 2089.25 ml  Output    700 ml  Net 1389.25 ml   Filed Weights   09/17/13 0500 09/17/13 0812 09/18/13 0500  Weight: 119.1 kg (262 lb 9.1 oz) 118.7 kg (261 lb 11 oz) 126 kg (277 lb 12.5 oz)    PHYSICAL EXAM  General:Awakeon vent Neuro: Alert and oriented X 3. Moves all extremities spontaneous HEENT:  Normal  Neck: Supple without bruits. Chest L subclav PC. R [place] swan Lungs:  clear Heart: tachy regular. +s3 Abdomen: Soft, non-tender, non-distended, BS + x 4.  Extremities: Mild edema. R foot with good DP pulse. FDressing on calf. R arm swelling (no DVT by u/s)   CBC  Recent Labs  09/17/13 0500  09/17/13 1615 09/18/13 0430  WBC 6.3  --   --  7.6  HGB 7.0*  < > 9.2* 8.7*  HCT 22.5*  < > 27.0* 27.3*  MCV 97.0  --   --  95.8  PLT 112*  --   --  148*  < > = values in this interval not displayed. Basic Metabolic Panel  Recent Labs  09/17/13 0500  09/17/13 1730 09/18/13 0430  NA 131*  < > 134* 131*  K 4.1  < > 4.1 4.0  CL 92*  --  96 94*  CO2 25  --  22 21  GLUCOSE 270*  --  232* 245*  BUN 30*  --  16 20  CREATININE 4.98*  --  3.15* 3.90*  CALCIUM 7.5*  --  7.6* 7.6*  MG 2.0  --   --  1.8  PHOS 4.5  --  3.9 4.9*  < > = values in this interval not displayed. Liver Function Tests  Recent Labs  09/17/13 0500 09/17/13 1730  AST 10  --   ALT 6  --   ALKPHOS 66  --   BILITOT 0.3  --   PROT 4.8*  --   ALBUMIN 1.8* 2.3*   Cardiac Enzymes No results found for this basename: CKTOTAL, CKMB, CKMBINDEX, TROPONINI,  in the last 72 hours Thyroid Function Tests No results found for this basename: TSH, T4TOTAL, FREET3, T3FREE, THYROIDAB,  in the last 72 hours  TELE  Sinus tachycardia 110-120s   Radiology/Studies  Dg Chest 2 View  09/06/2013   CLINICAL DATA:  CHF, intermittent dyspnea  EXAM: CHEST  2 VIEW  COMPARISON:  Prior chest x-ray 08/13/2013; prior chest CT 08/22/2013   FINDINGS: Stable cardiac and mediastinal contours. Atherosclerotic calcifications are present within the transverse aorta. Moderate left and small right pleural effusions are similar compared to prior. There is persistent associated bibasilar atelectasis. Mild pulmonary vascular congestion without overt edema. No pneumothorax. No new focal airspace consolidation. No acute osseous abnormality.  IMPRESSION: 1. Stable moderate left and small right pleural effusions and associated bibasilar atelectasis. 2. Pulmonary vascular congestion without overt edema.   Electronically Signed   By: Jacqulynn Cadet M.D.   On: 09/06/2013 07:55   Ct Chest Wo Contrast  08/22/2013   CLINICAL DATA:  Left lung cancer status post lobectomy. Weight gain. Renal insufficiency.  EXAM: CT CHEST WITHOUT CONTRAST  TECHNIQUE: Multidetector CT imaging of the chest was performed following the standard protocol without IV contrast.  COMPARISON:  Radiographs 08/13/2013 and 08/04/2013.  CT 08/02/2012.  FINDINGS: There are stable postsurgical changes related to prior left lower lobe resection. There has been interval enlargement of several mediastinal lymph nodes. These include 12 mm right paratracheal (image 19), 13 mm precarinal (image 23), and 14 mm AP window (image 25) lymph nodes. Some of these nodes have retained fatty hila. Allowing for the limitations of noncontrast technique, the hila appear stable.  There is stable low-density within the right thyroid lobe. Atherosclerosis of the aorta, great vessels and coronary arteries is noted. The heart size is normal. There is no pericardial effusion.  Moderate size dependent pleural effusions are present bilaterally. There is associated dependent airspace disease in both lower lobes. In addition, there are more focal airspace opacities within the right lower lobe and inferior aspect of the left upper lobe. The latter is somewhat nodular, measuring up to 1.8 cm on image 43. No other focal nodularity  or endobronchial lesions are demonstrated.  The visualized upper abdomen has a stable appearance. There is no adrenal mass. There is increased subcutaneous edema throughout the subcutaneous fat, especially within the anterior aspect of the upper abdomen.  No worrisome osseous findings are demonstrated.  IMPRESSION: 1. As demonstrated radiographically, there are bilateral pleural effusions and bilateral airspace opacities which are new compared with the prior CT. Although there are focal somewhat nodular components in the right lower  and left upper lobes, these findings are most likely infectious/inflammatory. 2. Progressive mediastinal lymphadenopathy. Some of the nodes have retained fatty hila and may be reactive. Metastatic disease cannot be completely excluded. 3. Stable atherosclerosis and low-density within the right thyroid lobe. 4. If the airspace opacities and pleural effusions fail to respond to appropriate clinical therapy, follow-up CT or PET-CT may be warranted to exclude metastatic disease.   Electronically Signed   By: Camie Patience M.D.   On: 08/22/2013 10:26   Dg Chest Portable 1 View  08/13/2013   CLINICAL DATA:  Shortness of breath increasing for 2 weeks. History of CHF.  EXAM: PORTABLE CHEST - 1 VIEW  COMPARISON:  08/04/2013  FINDINGS: Again noted are bibasilar lung densities that are suggestive for pleural effusions and atelectasis. Heart size appears to be enlarged. There is some peribronchial thickening and cannot exclude pulmonary edema.  IMPRESSION: Bilateral pleural effusions with basilar atelectasis. There is mild pulmonary edema. Minimal change from the previous examination.   Electronically Signed   By: Markus Daft M.D.   On: 08/13/2013 23:10    ASSESSMENT AND PLAN 1. Cardiogenic shock 2. Acute on chronic systolic HF     EF ~86-38% 3. 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 Acute respiratory failure 5. 3-V CAD 6. Carotid stenosis     - doppler 08/31/2013 shows R 60-79% ICA stenosis, L 80-99% ICA  stenosis     - vascular surgery aware 7. 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 DM1 9. Ischemic R foot s/p R femoral thrombectomy and 4-compartment fasciotomy.   Extremely difficult situation. She has cardiogenic shock and is now milrinone dependent. Fortunately co-ox now improved and she is tolerating intermittent HD. I reviewed cath films with TCTS and interventional team and she has diffuse moderate to severe CAD without clear target for intervention that will benefit her. Her EF is low and FEV1 < 1.0L in setting of previous lung surgery. The only option here is medical therapy and we will need to see if she can continue to tolerate HD. If not we will have reached a palliative situation. Fortunately she seems to be tolerating so far. If we cannot get her off milrinone finding an HD center that will treat her on milrinone maybe a challenge.   Tachycardia improved with amiodarone.   Has tolerated vascular surgery well. Appreciate VVS support. Will extubate today. D/w Dr. Chase Caller  The patient is critically ill with multiple organ systems failure and requires high complexity decision making for assessment and support, frequent evaluation and titration of therapies, application of advanced monitoring technologies and extensive interpretation of multiple databases.   Critical Care Time devoted to patient care services described in this note is 35 Minutes.  Shaune Pascal Kelly Ranieri,MD 2:08 PM

## 2013-09-18 NOTE — Consult Note (Signed)
PULMONARY / CRITICAL CARE MEDICINE   Name: Hannah [name] MRN: [id number] DOB: [birth year]    ADMISSION DATE:  09/03/2013 CONSULTATION DATE:  09/17/2013  REFERRING MD :  Irish Lack PRIMARY SERVICE: Cariology  CHIEF COMPLAINT:  respiratory failure  BRIEF PATIENT DESCRIPTION: Hannah [name] is a 43 yo morbidly obese female with PMH of DM1, stage V kidney disease, blindness, h/o lung CA s/p LLL lobectomy in 2013, recently had a high risk stress test on 5/18 which showed ischemia in anterior and apical region and was subsequently admitted for further evaluation. She underwent cath 09/04/2013 which showed chronically occluded RCA and triple vessel dx. CT surgery consulted. EF 22% by Myoview. 30-35% by echo. Hospital course very complicated including significant volume overload requiring CRRT, development of cardiogenic shock requiring IABP placement.  After placement, she developed right foot ischemia requiring vascular surgery consult.  IABP was removed and pt was taken to OR on 6/1 for right femoral endarterectomy, right femoral thrombectomy, four compartment fasciotomy. 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 5/18 carotid dopplers >>> right 60-79% IC stenosis, let 80-99%. 5/18 Admitted after high risk abnormal stress test (anterior and apical ischemia with EF 22%) 5/19 Cardiac cath - Severe 3 vessel obstructive CAD, Elevated LV filling pressures 5/22 CRRT started 5/27 to OR for left IJ HD  permacath 5/28 RHC and IABP placement 5/29 loss of pulses in R foot, IABP d/c'd 5/30 LE arterial duplex >>> Right CFA appears occluded with no color Doppler signal or pulsed Doppler signal. 5/31 RUE venous duplex >>> no obvious DVT/SVT. 6/1 Right femoral endarterectomy, right femoral thrombectomy, four compartment fasciotomy - on vent post operatively   LINES / TUBES: R IJ TLC 5/21 >>> 5/21 R  IJ HD TLC 5/21 >>> L IJ HD percath 5/27 >>> Right Luiz Blare 5/28 >>> 6/1?  CULTURES: None  ANTIBIOTICS: Cefuroxime 6/1 >>>   SUBJECTIVE:   6/21/5: On precedex RASS -3, Off precedex did follow commands per RN. SBT not yet started. ET tube too high  And advanced 4cm   VITAL SIGNS: Temp:  [97.4 F (36.3 C)-98.4 F (36.9 C)] 97.4 F (36.3 C) (06/02 0747) Pulse Rate:  [87-114] 87 (06/02 0500) Resp:  [15-19] 19 (06/02 0500) BP: (83-124)/(46-73) 124/65 mmHg (06/02 0500) SpO2:  [98 %-100 %] 100 % (06/02 0500) FiO2 (%):  [40 %] 40 % (06/02 0740) Weight:  [126 kg (277 lb 12.5 oz)] 126 kg (277 lb 12.5 oz) (06/02 0500) HEMODYNAMICS:   VENTILATOR SETTINGS: Vent Mode:  [-] PRVC FiO2 (%):  [40 %] 40 % Set Rate:  [15 bmp-16 bmp] 15 bmp Vt Set:  [500 mL] 500 mL PEEP:  [5 cmH20] 5 cmH20 Plateau Pressure:  [11 MLY65-03 cmH20] 11 cmH20 INTAKE / OUTPUT: Intake/Output     06/01 0701 - 06/02 0700 06/02 0701 - 06/03 0700   P.O. 60    I.V. (mL/kg) 1355.5 (10.8) 304 (2.4)   IV Piggyback 600    Total Intake(mL/kg) 2015.5 (16) 304 (2.4)   Other 2397    Blood 500    Total Output 2897     Net -881.6 +304        Urine Occurrence  1 x     PHYSICAL EXAMINATION: General: Obese female, sedate in bed on vent. Neuro: sedated. HEENT: [place]/AT. PERRL, sclerae anicteric. Cardiovascular: RRR, no M/R/G.  Lungs: Respirations even and unlabored on vent.  CTA bilaterally, No W/R/R.  Abdomen: BS x 4, soft, NT/ND.  Musculoskeletal: RLE dressings in place, C/D/I. 1+ edema LLE. Skin: Intact, warm, no rashes.    LABS:  PULMONARY  Recent Labs Lab 09/13/13 1129  09/13/13 1451  09/17/13 0515 09/17/13 1339 09/17/13 1615 09/17/13 1857 09/18/13 0501  PHART  --   --  7.333*  --   --  7.389 7.392 7.354  --   PCO2ART  --   --  39.4  --   --  38.9 42.2 31.2*  --   PO2ART  --   --  74.0*  --   --  94.0 166.0* 107.0*  --   HCO3 22.8  --  21.0  --   --  23.5 25.6* 17.4*  --   TCO2 24  --  22  --   --  25 27  18   --   O2SAT 40.0  < > 94.0  < > 59.7 97.0 99.0 98.0 66.5  < > = values in this interval not displayed.  CBC  Recent Labs Lab 09/16/13 0447 09/17/13 0500 09/17/13 1339 09/17/13 1615 09/18/13 0430  HGB 7.4* 7.0* 8.8* 9.2* 8.7*  HCT 23.8* 22.5* 26.0* 27.0* 27.3*  WBC 5.9 6.3  --   --  7.6  PLT 90* 112*  --   --  148*    COAGULATION No results found for this basename: INR,  in the last 168 hours  CARDIAC  No results found for this basename: TROPONINI,  in the last 168 hours No results found for this basename: PROBNP,  in the last 168 hours   CHEMISTRY  Recent Labs Lab 09/14/13 1500 09/15/13 0411  09/16/13 0447 09/16/13 1600 09/17/13 0500 09/17/13 1339 09/17/13 1615 09/17/13 1730 09/18/13 0430  NA 139  --   < > 137 134* 131* 136* 133* 134* 131*  K 3.2*  --   < > 3.8 3.8 4.1 3.0* 3.6* 4.1 4.0  CL 99  --   < > 98 94* 92*  --   --  96 94*  CO2 28  --   < > 27 27 25   --   --  22 21  GLUCOSE 121*  --   < > 253* 194* 270*  --   --  232* 245*  BUN 16  --   < > 18 23 30*  --   --  16 20  CREATININE 2.15*  --   < > 3.19* 4.16* 4.98*  --   --  3.15* 3.90*  CALCIUM 7.4*  --   < > 7.4* 7.4* 7.5*  --   --  7.6* 7.6*  MG 2.0 2.1  --  1.9  --  2.0  --   --   --  1.8  PHOS 2.8  --   < > 3.4 3.5 4.5  --   --  3.9 4.9*  < > = values in this interval not displayed. Estimated Creatinine Clearance: 25.3 ml/min (by C-G formula based on Cr of 3.9).   LIVER  Recent Labs Lab 09/12/13 0500  09/13/13 0428  09/15/13 2103 09/16/13 0447 09/16/13 1600 09/17/13 0500 09/17/13 1730  AST 19  --  12  --   --   --   --  10  --   ALT 17  --  14  --   --   --   --  6  --   ALKPHOS 72  --  66  --   --   --   --  66  --   BILITOT 0.3  --  0.2*  --   --   --   --  0.3  --   PROT 5.6*  --  5.5*  --   --   --   --  4.8*  --   ALBUMIN 2.2*  < > 2.3*  < > 1.8* 1.8* 1.9* 1.8* 2.3*  < > = values in this interval not displayed.   INFECTIOUS No results found for this basename: LATICACIDVEN,  PROCALCITON,  in the last 168 hours   ENDOCRINE CBG (last 3)   Recent Labs  09/17/13 1746 09/17/13 2226 09/18/13 0745  GLUCAP 221* 214* 209*         IMAGING x48h  Dg Chest Port 1 View  09/17/2013   CLINICAL DATA:  ETT placement  EXAM: PORTABLE CHEST - 1 VIEW  COMPARISON:  Chest radiograph 09/16/2013.  FINDINGS: Low lung volumes. Cardiac silhouette is enlarged. Endotracheal tube tip 7.6 cm above the carina projecting at the level of the clavicles. The patient's Swan-Ganz catheter has been removed. The left-sided central venous catheter stable. NG tube is appreciated tip not viewed on this study. Persistent left lower lobe consolidation. Decreased conspicuity of the right lower lobe density. There is prominence of the interstitial markings. No acute osseous abnormalities.  IMPRESSION: Endotracheal tube adequately positioned. Interval insertion of an NG tube and removal of the patient's Swan-Ganz catheter. The left-sided dialysis catheter stable.  Improved pulmonary edema.  Persistent left lower lobe density   Electronically Signed   By: Margaree Mackintosh M.D.   On: 09/17/2013 20:40   Dg Chest Port 1 View  09/16/2013   CLINICAL DATA:  Swan-Ganz position  EXAM: PORTABLE CHEST - 1 VIEW  COMPARISON:  Multiple prior studies  FINDINGS: There is a Swan-Ganz central venous line with tip in the distal right main pulmonary artery in the region of the right hilum. There is extensive right middle lobe consolidation. There is left lower lobe consolidation.  IMPRESSION: Swan-Ganz central line projects over the right hilum.   Electronically Signed   By: Skipper Cliche M.D.   On: 09/16/2013 15:31      ASSESSMENT / PLAN:  PULMONARY A: VDRF S/p LLL lobectomy secondary to [place] Former smoker Severe COPD - Gold IV (PFT's 5/20: FEV1 28% pre BD, 36% post BD)   - Sync with vent. SBT going to start   P:   - check et tube position with cxr - Full vent support. - Do SBT; if does well can consider  extubation - Goal pH > 7.30, SpO2 > 93%.   CARDIOVASCULAR A:  Cardiogenic Shock - now requiring levo and milrinone. Acute on chronic systolic heart failure Multivessel CAD- CVTS consulted - not a candidate for CABG due to poor lung function & shock Acute pulmonary edema/ volume overload   - on levophed and milrinone. Might still be wet P:  - Cards following. -taper levo gtt,  -keep milrinone on until shock resolved, follow co-ox daily   VASCULAR A: Right foot ischemia s/p  Right femoral endarterectomy, right femoral thrombectomy, four compartment fasciotomy Carotid stenosis - doppler 08/31/2013 shows R 60-79% ICA stenosis, L 80-99% ICA stenosis P: - Vascular following RLE , guarded prognosis for RLE  RENAL A:  ESRD - on CVVHD lt permacath P:   - Nephrology following. -HD once off pressors - will need volume off to facilitate extubation  GASTROINTESTINAL A:   Obesity P:   - NPO for now. - Pantoprazole.  HEMATOLOGIC A:   Anemia P:  - Goal Hgb > 8 given cardiac hx. - SCD's. - Manage per primary.  INFECTIOUS A:  No acute issues. P:   - Monitor for fever / leukocytosis.  ENDOCRINE A:   DM P:   - SSI.  NEUROLOGIC A:   Acute Encephalopathy P:   - On Precedex for sedation. -fent prn - Goal RASS 0 to - 1. - Daily WUA.    Summary - s/p revascularisation & fasciotomy to salvage RLE, hope to extubate 62/15 after  SBT assessment   I have personally obtained a history, examined the patient, evaluated laboratory and imaging results, formulated the assessment and plan and placed orders. CRITICAL CARE: The patient is critically ill with multiple organ systems failure and requires high complexity decision making for assessment and support, frequent evaluation and titration of therapies, application of advanced monitoring technologies and extensive interpretation of multiple databases. Critical Care Time devoted to patient care services described in this note is  35_ minutes.  ,    Dr. Brand Males, M.D., [facility].C.P Pulmonary and Critical Care Medicine Staff Physician [facility] Pager: [phone], If no answer or between  15:00h - 7:00h: call [phone]  09/18/2013 10:51 AM

## 2013-09-18 NOTE — Procedures (Signed)
Extubation Procedure Note  Patient Details:   Name: Hannah [name] DOB: February 11, 1971 MRN: [id number]   Airway Documentation:     Evaluation  O2 sats: stable throughout Complications: No apparent complications Patient did tolerate procedure well. Bilateral Breath Sounds: Clear;Diminished Suctioning: Oral;Airway Yes Placed on 4l/min [place] Vocalizes well  Revonda Standard 09/18/2013, 2:34 PM

## 2013-09-18 NOTE — Progress Notes (Addendum)
[facility]  Subjective  - Intubated sedated.   Objective 124/65 87 97.4 F (36.3 C) (Oral) 19 100%  Intake/Output Summary (Last 24 hours) at 09/18/13 0744 Last data filed at 09/18/13 0300  Gross per 24 hour  Intake 2015.45 ml  Output   2897 ml  Net -881.55 ml    Right groin incision C/D Doppler signals DP bilateral  Fasciotomy dressing C/D Feet and toes are cool touch  Assessment/Planning: POD #1 S/p Right femoral endarterectomy, right femoral thrombectomy, four compartment fasciotomy Intubated/sedated Carotid stenosis - doppler 08/31/2013 shows R 60-79% ICA stenosis, L 80-99% ICA stenosis ESRD left IJ-SonoSite and insertion 23 cm tunnel hemodialysis catheter-Diateck placed 09/12/2013    Hannah [name] 09/18/2013 7:44 AM --  Laboratory Lab Results:  Recent Labs  09/17/13 0500  09/17/13 1615 09/18/13 0430  WBC 6.3  --   --  7.6  HGB 7.0*  < > 9.2* 8.7*  HCT 22.5*  < > 27.0* 27.3*  PLT 112*  --   --  148*  < > = values in this interval not displayed. BMET  Recent Labs  09/17/13 1730 09/18/13 0430  NA 134* 131*  K 4.1 4.0  CL 96 94*  CO2 22 21  GLUCOSE 232* 245*  BUN 16 20  CREATININE 3.15* 3.90*  CALCIUM 7.6* 7.6*    COAG Lab Results  Component Value Date   INR 1.13 09/06/2013   INR 1.09 09/03/2013   INR 0.89 12/04/2012   No results found for this basename: PTT      Doppler signals bilateral feet Fasciotomies clean right leg Hopefully feet will improve as Levophed is weaned Will most likely close fasciotomies tomorrow  Ruta Hinds, MD [facility] Office: [phone] Pager: [phone]

## 2013-09-18 NOTE — Progress Notes (Signed)
    Subjective  - POD #1  Still complaining of no sensation in her right foot Some pain in her right leg   Physical Exam:  Good Doppler signal in the right dorsalis pedis. Her motor function is approximately the same as preop.  Sensation is diminished to the level of the ankle.        Assessment/Plan:  POD #1  Plan is for possible fasciotomy closure in the near future.  Over the patient sensation will, back now that she has better perfusion.  However this could be ischemic neuropathy which may not improve.  Only time will tell.  Hannah [name] 09/18/2013 3:46 PM --  Danley Danker Vitals:   09/18/13 1537  BP:   Pulse:   Temp: 98.3 F (36.8 C)  Resp:     Intake/Output Summary (Last 24 hours) at 09/18/13 1546 Last data filed at 09/18/13 1400  Gross per 24 hour  Intake 1918.05 ml  Output    600 ml  Net 1318.05 ml     Laboratory CBC    Component Value Date/Time   WBC 7.6 09/18/2013 0430   HGB 8.7* 09/18/2013 0430   HCT 27.3* 09/18/2013 0430   PLT 148* 09/18/2013 0430    BMET    Component Value Date/Time   NA 131* 09/18/2013 0430   K 4.0 09/18/2013 0430   CL 94* 09/18/2013 0430   CO2 21 09/18/2013 0430   GLUCOSE 245* 09/18/2013 0430   BUN 20 09/18/2013 0430   CREATININE 3.90* 09/18/2013 0430   CREATININE 4.17* 07/06/2013 1618   CALCIUM 7.6* 09/18/2013 0430   GFRNONAA 13* 09/18/2013 0430   GFRNONAA 71 07/13/2011 0835   GFRAA 15* 09/18/2013 0430   GFRAA 82 07/13/2011 0835    COAG Lab Results  Component Value Date   INR 1.13 09/06/2013   INR 1.09 09/03/2013   INR 0.89 12/04/2012   No results found for this basename: PTT    Antibiotics Anti-infectives   Start     Dose/Rate Route Frequency Ordered Stop   09/18/13 1200  cefUROXime (ZINACEF) 1.5 g in dextrose 5 % 50 mL IVPB     1.5 g 100 mL/hr over 30 Minutes Intravenous Every 24 hours 09/17/13 1840 09/18/13 1200   09/18/13 0100  cefUROXime (ZINACEF) 1.5 g in dextrose 5 % 50 mL IVPB  Status:  Discontinued     1.5 g 100 mL/hr over  30 Minutes Intravenous Every 12 hours 09/17/13 1749 09/17/13 1840   09/17/13 1330  cefUROXime (ZINACEF) 1.5 g in dextrose 5 % 50 mL IVPB     1.5 g 100 mL/hr over 30 Minutes Intravenous To Surgery 09/17/13 1317 09/17/13 1348   09/12/13 0000  ceFAZolin (ANCEF) IVPB 1 g/50 mL premix    Comments:  Send with pt to OR   1 g 100 mL/hr over 30 Minutes Intravenous On call 09/11/13 1055 09/12/13 0100       V. Leia Alf, M.D. [facility] Office: [phone] Pager:  [phone]

## 2013-09-19 ENCOUNTER — Inpatient Hospital Stay (HOSPITAL_COMMUNITY): Payer: Medicare Other | Admitting: Certified Registered Nurse Anesthetist

## 2013-09-19 ENCOUNTER — Encounter (HOSPITAL_COMMUNITY): Payer: Medicare Other | Admitting: Certified Registered Nurse Anesthetist

## 2013-09-19 ENCOUNTER — Encounter (HOSPITAL_COMMUNITY): Payer: Self-pay | Admitting: Certified Registered Nurse Anesthetist

## 2013-09-19 ENCOUNTER — Inpatient Hospital Stay (HOSPITAL_COMMUNITY): Payer: Medicare Other

## 2013-09-19 ENCOUNTER — Encounter (HOSPITAL_COMMUNITY)
Admission: EM | Disposition: A | Discharge status: Home Health | Payer: Medicare Other | Source: Home / Self Care | Attending: Interventional Cardiology

## 2013-09-19 DIAGNOSIS — N19 Unspecified kidney failure: Secondary | ICD-10-CM

## 2013-09-19 DIAGNOSIS — I2510 Atherosclerotic heart disease of native coronary artery without angina pectoris: Secondary | ICD-10-CM

## 2013-09-19 DIAGNOSIS — C3430 Malignant neoplasm of lower lobe, unspecified bronchus or lung: Secondary | ICD-10-CM

## 2013-09-19 DIAGNOSIS — R599 Enlarged lymph nodes, unspecified: Secondary | ICD-10-CM

## 2013-09-19 DIAGNOSIS — N049 Nephrotic syndrome with unspecified morphologic changes: Secondary | ICD-10-CM

## 2013-09-19 DIAGNOSIS — I428 Other cardiomyopathies: Secondary | ICD-10-CM

## 2013-09-19 DIAGNOSIS — I469 Cardiac arrest, cause unspecified: Secondary | ICD-10-CM

## 2013-09-19 DIAGNOSIS — R570 Cardiogenic shock: Secondary | ICD-10-CM

## 2013-09-19 DIAGNOSIS — I749 Embolism and thrombosis of unspecified artery: Secondary | ICD-10-CM

## 2013-09-19 DIAGNOSIS — M79A29 Nontraumatic compartment syndrome of unspecified lower extremity: Secondary | ICD-10-CM

## 2013-09-19 DIAGNOSIS — R609 Edema, unspecified: Secondary | ICD-10-CM

## 2013-09-19 HISTORY — PX: FASCIOTOMY CLOSURE: SHX5829

## 2013-09-19 LAB — GLUCOSE, CAPILLARY
GLUCOSE-CAPILLARY: 131 mg/dL — AB (ref 70–99)
GLUCOSE-CAPILLARY: 144 mg/dL — AB (ref 70–99)
Glucose-Capillary: 161 mg/dL — ABNORMAL HIGH (ref 70–99)

## 2013-09-19 LAB — RENAL FUNCTION PANEL
ALBUMIN: 1.8 g/dL — AB (ref 3.5–5.2)
Albumin: 1.7 g/dL — ABNORMAL LOW (ref 3.5–5.2)
BUN: 28 mg/dL — AB (ref 6–23)
BUN: 33 mg/dL — ABNORMAL HIGH (ref 6–23)
CALCIUM: 7.5 mg/dL — AB (ref 8.4–10.5)
CALCIUM: 7.9 mg/dL — AB (ref 8.4–10.5)
CO2: 19 mEq/L (ref 19–32)
CO2: 20 meq/L (ref 19–32)
Chloride: 95 mEq/L — ABNORMAL LOW (ref 96–112)
Chloride: 92 mEq/L — ABNORMAL LOW (ref 96–112)
Creatinine, Ser: 5.2 mg/dL — ABNORMAL HIGH (ref 0.50–1.10)
Creatinine, Ser: 5.92 mg/dL — ABNORMAL HIGH (ref 0.50–1.10)
GFR calc Af Amer: 11 mL/min — ABNORMAL LOW (ref >90)
GFR, EST AFRICAN AMERICAN: 9 mL/min — AB (ref >90)
GFR, EST NON AFRICAN AMERICAN: 9 mL/min — AB (ref >90)
GFR, EST NON AFRICAN AMERICAN: 8 mL/min — AB (ref >90)
GLUCOSE: 162 mg/dL — AB (ref 70–99)
Glucose, Bld: 187 mg/dL — ABNORMAL HIGH (ref 70–99)
PHOSPHORUS: 5.5 mg/dL — AB (ref 2.3–4.6)
POTASSIUM: 4.3 meq/L (ref 3.7–5.3)
Phosphorus: 6.9 mg/dL — ABNORMAL HIGH (ref 2.3–4.6)
Potassium: 3.9 mEq/L (ref 3.7–5.3)
Sodium: 133 mEq/L — ABNORMAL LOW (ref 137–147)
Sodium: 131 mEq/L — ABNORMAL LOW (ref 137–147)

## 2013-09-19 LAB — BLOOD GAS, ARTERIAL
Acid-base deficit: 9.1 mmol/L — ABNORMAL HIGH (ref 0.0–2.0)
Bicarbonate: 16.2 mEq/L — ABNORMAL LOW (ref 20.0–24.0)
DRAWN BY: 252031
FIO2: 1 %
O2 Saturation: 98.5 %
PATIENT TEMPERATURE: 98.6
PEEP: 5 cmH2O
RATE: 20 resp/min
TCO2: 17.3 mmol/L (ref 0–100)
VT: 500 mL
pCO2 arterial: 35 mmHg (ref 35.0–45.0)
pH, Arterial: 7.288 — ABNORMAL LOW (ref 7.350–7.450)
pO2, Arterial: 133 mmHg — ABNORMAL HIGH (ref 80.0–100.0)

## 2013-09-19 LAB — BASIC METABOLIC PANEL
BUN: 35 mg/dL — ABNORMAL HIGH (ref 6–23)
CO2: 17 meq/L — AB (ref 19–32)
CREATININE: 6.05 mg/dL — AB (ref 0.50–1.10)
Calcium: 11.1 mg/dL — ABNORMAL HIGH (ref 8.4–10.5)
Chloride: 91 mEq/L — ABNORMAL LOW (ref 96–112)
GFR calc Af Amer: 9 mL/min — ABNORMAL LOW (ref >90)
GFR, EST NON AFRICAN AMERICAN: 8 mL/min — AB (ref >90)
GLUCOSE: 343 mg/dL — AB (ref 70–99)
Potassium: 5.3 mEq/L (ref 3.7–5.3)
Sodium: 129 mEq/L — ABNORMAL LOW (ref 137–147)

## 2013-09-19 LAB — CBC
HCT: 26.9 % — ABNORMAL LOW (ref 36.0–46.0)
HEMOGLOBIN: 8.7 g/dL — AB (ref 12.0–15.0)
MCH: 30.1 pg (ref 26.0–34.0)
MCHC: 32.3 g/dL (ref 30.0–36.0)
MCV: 93.1 fL (ref 78.0–100.0)
PLATELETS: 171 10*3/uL (ref 150–400)
RBC: 2.89 MIL/uL — ABNORMAL LOW (ref 3.87–5.11)
RDW: 17.1 % — AB (ref 11.5–15.5)
WBC: 10.2 10*3/uL (ref 4.0–10.5)

## 2013-09-19 LAB — APTT
APTT: 21 s — AB (ref 24–37)
aPTT: 37 seconds (ref 24–37)

## 2013-09-19 LAB — MAGNESIUM
Magnesium: 1.8 mg/dL (ref 1.5–2.5)
Magnesium: 2.2 mg/dL (ref 1.5–2.5)

## 2013-09-19 LAB — LACTIC ACID, PLASMA: Lactic Acid, Venous: 8.2 mmol/L — ABNORMAL HIGH (ref 0.5–2.2)

## 2013-09-19 LAB — TROPONIN I: Troponin I: <0.3 ng/mL (ref <0.30)

## 2013-09-19 SURGERY — FASCIOTOMY CLOSURE
Anesthesia: Monitor Anesthesia Care | Site: Leg Lower | Laterality: Right

## 2013-09-19 MED ORDER — SODIUM CHLORIDE 0.9 % IV SOLN
INTRAVENOUS | Status: DC | PRN
  Administered 2013-09-19: 12:00:00 via INTRAVENOUS

## 2013-09-19 MED ORDER — PHENYLEPHRINE HCL 10 MG/ML IJ SOLN
INTRAMUSCULAR | Status: DC | PRN
  Administered 2013-09-19: 80 ug via INTRAVENOUS

## 2013-09-19 MED ORDER — FENTANYL CITRATE 0.05 MG/ML IJ SOLN
50.0000 ug | Freq: Once | INTRAMUSCULAR | Status: AC
  Administered 2013-09-19: 50 ug via INTRAVENOUS

## 2013-09-19 MED ORDER — BUPIVACAINE HCL (PF) 0.25 % IJ SOLN
INTRAMUSCULAR | Status: DC | PRN
  Administered 2013-09-19: 10 mL

## 2013-09-19 MED ORDER — ARGATROBAN 50 MG/50ML IV SOLN
0.5000 ug/kg/min | INTRAVENOUS | Status: DC
  Administered 2013-09-20: 0.4 ug/kg/min via INTRAVENOUS
  Administered 2013-09-20 (×2): 0.65 ug/kg/min via INTRAVENOUS
  Administered 2013-09-21 – 2013-10-03 (×21): 0.4 ug/kg/min via INTRAVENOUS
  Administered 2013-10-04 – 2013-10-11 (×15): 0.5 ug/kg/min via INTRAVENOUS
  Filled 2013-09-19 (×42): qty 50

## 2013-09-19 MED ORDER — SODIUM CHLORIDE 0.9 % IV SOLN
0.0000 ug/h | INTRAVENOUS | Status: DC
  Administered 2013-09-19: 50 ug/h via INTRAVENOUS
  Filled 2013-09-19: qty 50

## 2013-09-19 MED ORDER — SUCCINYLCHOLINE CHLORIDE 20 MG/ML IJ SOLN
INTRAMUSCULAR | Status: DC | PRN
  Administered 2013-09-19: 100 mg via INTRAVENOUS

## 2013-09-19 MED ORDER — ONDANSETRON HCL 4 MG/2ML IJ SOLN
INTRAMUSCULAR | Status: AC
  Filled 2013-09-19: qty 2

## 2013-09-19 MED ORDER — PROPOFOL 10 MG/ML IV BOLUS
INTRAVENOUS | Status: AC
  Filled 2013-09-19: qty 20

## 2013-09-19 MED ORDER — LIDOCAINE HCL 1 % IJ SOLN
INTRAMUSCULAR | Status: DC | PRN
  Administered 2013-09-19: 10 mL via INTRADERMAL

## 2013-09-19 MED ORDER — AMIODARONE HCL 200 MG PO TABS
200.0000 mg | ORAL_TABLET | Freq: Two times a day (BID) | ORAL | Status: DC
  Filled 2013-09-19 (×5): qty 1

## 2013-09-19 MED ORDER — LIDOCAINE HCL (CARDIAC) 20 MG/ML IV SOLN
INTRAVENOUS | Status: AC
  Filled 2013-09-19: qty 5

## 2013-09-19 MED ORDER — ARGATROBAN 50 MG/50ML IV SOLN
0.5000 ug/kg/min | INTRAVENOUS | Status: DC
  Administered 2013-09-19: 0.5 ug/kg/min via INTRAVENOUS
  Filled 2013-09-19: qty 50

## 2013-09-19 MED ORDER — ARTIFICIAL TEARS OP OINT
TOPICAL_OINTMENT | OPHTHALMIC | Status: AC
  Filled 2013-09-19: qty 3.5

## 2013-09-19 MED ORDER — LIDOCAINE-EPINEPHRINE 1 %-1:100000 IJ SOLN
INTRAMUSCULAR | Status: AC
  Filled 2013-09-19: qty 1

## 2013-09-19 MED ORDER — FENTANYL BOLUS VIA INFUSION
50.0000 ug | INTRAVENOUS | Status: DC | PRN
  Filled 2013-09-19: qty 100

## 2013-09-19 MED ORDER — MIDAZOLAM HCL 5 MG/5ML IJ SOLN
INTRAMUSCULAR | Status: DC | PRN
  Administered 2013-09-19: 1 mg
  Administered 2013-09-19: 2 mg via INTRAVENOUS

## 2013-09-19 MED ORDER — ETOMIDATE 2 MG/ML IV SOLN
INTRAVENOUS | Status: DC | PRN
  Administered 2013-09-19: 12 mg via INTRAVENOUS

## 2013-09-19 MED ORDER — LIDOCAINE HCL (CARDIAC) 20 MG/ML IV SOLN
INTRAVENOUS | Status: DC | PRN
  Administered 2013-09-19: 80 mg via INTRAVENOUS

## 2013-09-19 MED ORDER — AMIODARONE LOAD VIA INFUSION
150.0000 mg | Freq: Once | INTRAVENOUS | Status: AC
  Administered 2013-09-19: 150 mg via INTRAVENOUS
  Filled 2013-09-19: qty 83.34

## 2013-09-19 MED ORDER — SODIUM CHLORIDE 0.9 % IV SOLN
INTRAVENOUS | Status: DC | PRN
  Administered 2013-09-19: 21:00:00 via INTRAVENOUS

## 2013-09-19 MED ORDER — ONDANSETRON HCL 4 MG/2ML IJ SOLN
INTRAMUSCULAR | Status: DC | PRN
  Administered 2013-09-19: 4 mg via INTRAVENOUS

## 2013-09-19 MED ORDER — MIDAZOLAM HCL 2 MG/2ML IJ SOLN
INTRAMUSCULAR | Status: AC
  Filled 2013-09-19: qty 2

## 2013-09-19 MED ORDER — PHENYLEPHRINE 40 MCG/ML (10ML) SYRINGE FOR IV PUSH (FOR BLOOD PRESSURE SUPPORT)
PREFILLED_SYRINGE | INTRAVENOUS | Status: AC
  Filled 2013-09-19: qty 10

## 2013-09-19 MED ORDER — ROCURONIUM BROMIDE 50 MG/5ML IV SOLN
INTRAVENOUS | Status: AC
  Filled 2013-09-19: qty 1

## 2013-09-19 MED ORDER — 0.9 % SODIUM CHLORIDE (POUR BTL) OPTIME
TOPICAL | Status: DC | PRN
  Administered 2013-09-19: 1000 mL

## 2013-09-19 MED ORDER — ROPIVACAINE HCL 5 MG/ML IJ SOLN
INTRAMUSCULAR | Status: DC | PRN
  Administered 2013-09-19: 25 mL via PERINEURAL

## 2013-09-19 MED ORDER — PROPOFOL 10 MG/ML IV BOLUS
INTRAVENOUS | Status: DC | PRN
  Administered 2013-09-19 (×6): 20 mg via INTRAVENOUS

## 2013-09-19 MED ORDER — BUPIVACAINE HCL (PF) 0.25 % IJ SOLN
INTRAMUSCULAR | Status: AC
  Filled 2013-09-19: qty 30

## 2013-09-19 MED ORDER — FENTANYL CITRATE 0.05 MG/ML IJ SOLN
INTRAMUSCULAR | Status: AC
  Filled 2013-09-19: qty 5

## 2013-09-19 MED ORDER — MILRINONE IN DEXTROSE 20 MG/100ML IV SOLN
0.1250 ug/kg/min | INTRAVENOUS | Status: DC
  Administered 2013-09-19 – 2013-09-20 (×3): 0.25 ug/kg/min via INTRAVENOUS
  Administered 2013-09-21: 0.125 ug/kg/min via INTRAVENOUS
  Administered 2013-09-21: 0.25 ug/kg/min via INTRAVENOUS
  Filled 2013-09-19 (×5): qty 100

## 2013-09-19 MED ORDER — FENTANYL CITRATE 0.05 MG/ML IJ SOLN
INTRAMUSCULAR | Status: DC | PRN
  Administered 2013-09-19: 25 ug via INTRAVENOUS
  Administered 2013-09-19: 50 ug via INTRAVENOUS
  Administered 2013-09-19 (×2): 25 ug via INTRAVENOUS

## 2013-09-19 MED ORDER — LIDOCAINE HCL (PF) 1 % IJ SOLN
INTRAMUSCULAR | Status: AC
  Filled 2013-09-19: qty 30

## 2013-09-19 SURGICAL SUPPLY — 36 items
BAG DECANTER FOR FLEXI CONT (MISCELLANEOUS) ×2 IMPLANT
BANDAGE ELASTIC 4 VELCRO ST LF (GAUZE/BANDAGES/DRESSINGS) IMPLANT
BANDAGE ELASTIC 6 VELCRO ST LF (GAUZE/BANDAGES/DRESSINGS) IMPLANT
BANDAGE GAUZE ELAST BULKY 4 IN (GAUZE/BANDAGES/DRESSINGS) IMPLANT
BLADE 10 SAFETY STRL DISP (BLADE) ×2 IMPLANT
CANISTER SUCTION 2500CC (MISCELLANEOUS) ×2 IMPLANT
COVER SURGICAL LIGHT HANDLE (MISCELLANEOUS) ×2 IMPLANT
DECANTER SPIKE VIAL GLASS SM (MISCELLANEOUS) ×2 IMPLANT
DRSG COVADERM 4X8 (GAUZE/BANDAGES/DRESSINGS) ×4 IMPLANT
ELECT REM PT RETURN 9FT ADLT (ELECTROSURGICAL) ×2
ELECTRODE REM PT RTRN 9FT ADLT (ELECTROSURGICAL) ×1 IMPLANT
GLOVE BIO SURGEON STRL SZ7.5 (GLOVE) ×2 IMPLANT
GLOVE BIOGEL PI IND STRL 6.5 (GLOVE) ×2 IMPLANT
GLOVE BIOGEL PI IND STRL 7.0 (GLOVE) ×1 IMPLANT
GLOVE BIOGEL PI INDICATOR 6.5 (GLOVE) ×2
GLOVE BIOGEL PI INDICATOR 7.0 (GLOVE) ×1
GLOVE ECLIPSE 6.5 STRL STRAW (GLOVE) ×2 IMPLANT
GLOVE SURG SS PI 6.0 STRL IVOR (GLOVE) ×2 IMPLANT
GOWN STRL REUS W/ TWL LRG LVL3 (GOWN DISPOSABLE) ×2 IMPLANT
GOWN STRL REUS W/TWL LRG LVL3 (GOWN DISPOSABLE) ×2
KIT BASIN OR (CUSTOM PROCEDURE TRAY) ×2 IMPLANT
KIT ROOM TURNOVER OR (KITS) ×2 IMPLANT
NS IRRIG 1000ML POUR BTL (IV SOLUTION) ×2 IMPLANT
PACK GENERAL/GYN (CUSTOM PROCEDURE TRAY) ×2 IMPLANT
PACK UNIVERSAL I (CUSTOM PROCEDURE TRAY) ×2 IMPLANT
PAD ARMBOARD 7.5X6 YLW CONV (MISCELLANEOUS) ×4 IMPLANT
SPONGE GAUZE 4X4 12PLY (GAUZE/BANDAGES/DRESSINGS) ×2 IMPLANT
STAPLER VISISTAT 35W (STAPLE) ×2 IMPLANT
SUT ETHILON 3 0 PS 1 (SUTURE) IMPLANT
SUT VIC AB 2-0 CTX 36 (SUTURE) ×2 IMPLANT
SUT VIC AB 3-0 SH 27 (SUTURE) ×1
SUT VIC AB 3-0 SH 27X BRD (SUTURE) ×1 IMPLANT
SUT VICRYL 4-0 PS2 18IN ABS (SUTURE) IMPLANT
TOWEL OR 17X24 6PK STRL BLUE (TOWEL DISPOSABLE) ×2 IMPLANT
TOWEL OR 17X26 10 PK STRL BLUE (TOWEL DISPOSABLE) ×2 IMPLANT
WATER STERILE IRR 1000ML POUR (IV SOLUTION) IMPLANT

## 2013-09-19 NOTE — Progress Notes (Signed)
Md called and updated on pt status. Orders were received to dc subclavian central line, milrinone and to run levophed through the PIV after starting the IV anticoagulant. Orders were placed in computer prior to patient feeling dizzy. BP was taken and pt bp had dropped significantly since last conversation with MD as well as a notable increase on HR. Md was called again to update on having to increase the levophed. MD decided to leave subclavian central line, restart the milrinone and to increase levo as needed to stabilize bp. Levo was increased and pt started feeling slightly better. Md wanted to leave central line access until pt felt better. Orders were also placed to draw lab cultures. Pt bp increasing with levophed. Pt family at bedside. Oncoming RN aware of changing orders. Pt alert oriented.

## 2013-09-19 NOTE — Code Documentation (Signed)
  Patient Name: Hannah [name]   MRN: [id number]   Date of Birth/ Sex: 09/27/1970 , female      Admission Date: 09/03/2013  Attending Provider: Jettie Booze, MD  Primary Diagnosis: Intermediate coronary syndrome   Indication: Pt was in her usual state of health until this PM, when she was noted to be in ventricular fibrillation. Code blue was subsequently called. At the time of arrival on scene, ACLS protocol was underway.   Technical Description:  - CPR performance duration:  10  minutes  - Was defibrillation or cardioversion used? No   - Was external pacer placed? Yes  - Was patient intubated pre/post CPR? Yes   Medications Administered: Y = Yes; Blank = No Amiodarone    Atropine    Calcium  Y  Epinephrine  Y  Lidocaine    Magnesium    Norepinephrine    Phenylephrine    Sodium bicarbonate  Y  Vasopressin     Post CPR evaluation:  - Final Status - Was patient successfully resuscitated ? Yes - What is current rhythm? Sinus tachycardia - What is current hemodynamic status? Stable  Miscellaneous Information:  - Labs sent, including: Bmet, mag, troponin, CBC, lactic acid,  - Primary team notified?  Yes  - Family Notified? Yes  - Additional notes/ transfer status:  Dr. Blaine Hamper and myself at bedside. Black box called and informed of patient. Primary team, Dr. Haroldine Laws contacted.     Cordelia Poche, MD  09/19/2013, 8:42 PM

## 2013-09-19 NOTE — Anesthesia Preprocedure Evaluation (Signed)
Anesthesia Evaluation  Patient identified by MRN, date of birth, ID band Patient awake    Reviewed: Allergy & Precautions, Patient's Chart, lab work & pertinent test results  History of Anesthesia Complications Negative for: history of anesthetic complications  Airway Mallampati: III TM Distance: >3 FB Neck ROM: Limited    Dental  (+) Teeth Intact   Pulmonary former smoker,  S/p lobectomy breath sounds clear to auscultation        Cardiovascular hypertension, Pt. on medications and Pt. on home beta blockers + CAD and + Peripheral Vascular Disease + dysrhythmias Supra Ventricular Tachycardia Rhythm:Regular Rate:Tachycardia     Neuro/Psych    GI/Hepatic Neg liver ROS, GERD-  ,  Endo/Other  diabetes (glu 131), Insulin DependentMorbid obesity  Renal/GU Renal disease (K+ 4.3)     Musculoskeletal   Abdominal (+) + obese,   Peds  Hematology  (+) Blood dyscrasia (h/o leukemia), anemia ,   Anesthesia Other Findings   Reproductive/Obstetrics                           Anesthesia Physical Anesthesia Plan  ASA: IV and emergent  Anesthesia Plan:    Post-op Pain Management:    Induction: Intravenous  Airway Management Planned: Oral ETT and Video Laryngoscope Planned  Additional Equipment:   Intra-op Plan:   Post-operative Plan: Post-operative intubation/ventilation  Informed Consent:   Only emergency history available  Plan Discussed with: CRNA  Anesthesia Plan Comments: (Emergent intubation: rapid sequence)        Anesthesia Quick Evaluation

## 2013-09-19 NOTE — Progress Notes (Signed)
CRITICAL VALUE ALERT  Critical value received:  HIT Platelet Antibody +  Date of notification:  09/19/2013  Time of notification:  1330  Critical value read back:yes  Nurse who received alert:  Charmayne Sheer, RN  MD notified (1st page):  Amy, Heart Failure Team  Time of first page:  42  MD notified (2nd page):  Time of second page:  Responding MD:  Amy, heart failure team  Time MD responded:  1338

## 2013-09-19 NOTE — Anesthesia Preprocedure Evaluation (Signed)
Anesthesia Evaluation  Patient identified by MRN, date of birth, ID band Patient awake    Reviewed: Allergy & Precautions, H&P , NPO status , Patient's Chart, lab work & pertinent test results  History of Anesthesia Complications Negative for: history of anesthetic complications  Airway Mallampati: III TM Distance: >3 FB Neck ROM: Full    Dental  (+) Teeth Intact   Pulmonary shortness of breath, former smoker,  H/o lung Ca s/p left lower lobe segmental resection, currently poor PFTs in setting of acute heart failure   + decreased breath sounds      Cardiovascular hypertension, Pt. on medications + angina at rest + CAD, + Past MI, + Peripheral Vascular Disease, +CHF and + DOE + dysrhythmias Supra Ventricular Tachycardia + Valvular Problems/Murmurs MR Rhythm:Regular Rate:Tachycardia     Neuro/Psych  Neuromuscular disease    GI/Hepatic negative GI ROS, Neg liver ROS,   Endo/Other  diabetes, Type 1, Insulin Dependent  Renal/GU ESRF and DialysisRenal disease     Musculoskeletal   Abdominal   Peds  Hematology  (+) Blood dyscrasia, anemia ,   Anesthesia Other Findings   Reproductive/Obstetrics                           Anesthesia Physical Anesthesia Plan  ASA: IV  Anesthesia Plan: Regional and MAC   Post-op Pain Management:    Induction:   Airway Management Planned: Natural Airway and Nasal Cannula  Additional Equipment: None  Intra-op Plan:   Post-operative Plan:   Informed Consent: I have reviewed the patients History and Physical, chart, labs and discussed the procedure including the risks, benefits and alternatives for the proposed anesthesia with the patient or authorized representative who has indicated his/her understanding and acceptance.   Dental advisory given  Plan Discussed with: CRNA and Surgeon  Anesthesia Plan Comments:         Anesthesia Quick Evaluation

## 2013-09-19 NOTE — Progress Notes (Signed)
[facility] Progress Note Patient Name: Hannah [name] DOB: 10/13/1970 MRN: [id number]  Date of Service  09/19/2013   HPI/Events of Note  Called after PEA arrest, ACLS/CPR. Presently tachycardia with adequate BP   eICU Interventions  PA Shearon Stalls to go to bedside to complete stabilization post arrest   Intervention Category Major Interventions: Code management / supervision  Wilhelmina Mcardle 09/19/2013, 8:39 PM

## 2013-09-19 NOTE — Progress Notes (Signed)
PULMONARY / CRITICAL CARE MEDICINE   Name: Hannah [name] MRN: [id number] DOB: 1971-02-11    ADMISSION DATE:  09/03/2013 CONSULTATION DATE:  09/17/2013  REFERRING MD :  Irish Lack PRIMARY SERVICE: Cariology  CHIEF COMPLAINT:  respiratory failure  BRIEF PATIENT DESCRIPTION: Hannah [name] is a 43 yo morbidly obese female with PMH of DM1, stage V kidney disease, blindness, h/o lung CA s/p LLL lobectomy in 2013, recently had a high risk stress test on 5/18 which showed ischemia in anterior and apical region and was subsequently admitted for further evaluation. She underwent cath 09/04/2013 which showed chronically occluded RCA and triple vessel dx. CT surgery consulted. EF 22% by Myoview. 30-35% by echo. Hospital course very complicated including significant volume overload requiring CRRT, development of cardiogenic shock requiring IABP placement.  After placement, she developed right foot ischemia requiring vascular surgery consult.  IABP was removed and pt was taken to OR on 6/1 for right femoral endarterectomy, right femoral thrombectomy, four compartment fasciotomy. 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 5/18 carotid dopplers >>> right 60-79% IC stenosis, let 80-99%. 5/18 Admitted after high risk abnormal stress test (anterior and apical ischemia with EF 22%) 5/19 Cardiac cath - Severe 3 vessel obstructive CAD, Elevated LV filling pressures 5/22 CRRT started 5/27 to OR for left IJ HD  permacath 5/28 RHC and IABP placement 5/29 loss of pulses in R foot, IABP d/c'd 5/30 LE arterial duplex >>> Right CFA appears occluded with no color Doppler signal or pulsed Doppler signal. 5/31 RUE venous duplex >>> no obvious DVT/SVT. 6/1 Right femoral endarterectomy, right femoral thrombectomy, four compartment fasciotomy - on vent post operatively 09/18/13 - extubated in SICU  LINES /  TUBES: R IJ TLC 5/21 >>> 5/21 R IJ HD TLC 5/21 >>> L IJ HD percath 5/27 >>> Right Luiz Blare 5/28 >>> 6/1? ET tube ? Date > extubate 09/18/13  CULTURES: None  ANTIBIOTICS:   Anti-infectives   Start     Dose/Rate Route Frequency Ordered Stop   09/18/13 1200  cefUROXime (ZINACEF) 1.5 g in dextrose 5 % 50 mL IVPB     1.5 g 100 mL/hr over 30 Minutes Intravenous Every 24 hours 09/17/13 1840 09/18/13 1200   09/18/13 0100  cefUROXime (ZINACEF) 1.5 g in dextrose 5 % 50 mL IVPB  Status:  Discontinued     1.5 g 100 mL/hr over 30 Minutes Intravenous Every 12 hours 09/17/13 1749 09/17/13 1840   09/17/13 1330  cefUROXime (ZINACEF) 1.5 g in dextrose 5 % 50 mL IVPB     1.5 g 100 mL/hr over 30 Minutes Intravenous To Surgery 09/17/13 1317 09/17/13 1348   09/12/13 0000  ceFAZolin (ANCEF) IVPB 1 g/50 mL premix    Comments:  Send with pt to OR   1 g 100 mL/hr over 30 Minutes Intravenous On call 09/11/13 1055 09/12/13 0100      SUBJECTIVE:   09/19/13 - extubated yesterday. Doing well. Going to OR for RLE wound closure per RN. Denies questions  VITAL SIGNS: Temp:  [97.5 F (36.4 C)-98.5 F (36.9 C)] 97.5 F (36.4 C) (06/03 0807) Pulse Rate:  [39-125] 118 (06/03 1015) Resp:  [12-25] 16 (06/03 1015) BP: (83-145)/(48-83) 126/74 mmHg (06/03 1000) SpO2:  [96 %-100 %] 100 % (06/03 1015) Arterial Line BP: (55-106)/(43-94) 65/49 mmHg (06/03 0745) FiO2 (%):  [40 %] 40 % (06/02 1400) Weight:  [885  kg (284 lb 6.3 oz)] 129 kg (284 lb 6.3 oz) (06/03 0700) HEMODYNAMICS:   VENTILATOR SETTINGS: Vent Mode:  [-] PRVC FiO2 (%):  [40 %] 40 % Set Rate:  [15 bmp] 15 bmp Vt Set:  [500 mL] 500 mL PEEP:  [5 cmH20] 5 cmH20 INTAKE / OUTPUT: Intake/Output     06/02 0701 - 06/03 0700 06/03 0701 - 06/04 0700   P.O.     I.V. (mL/kg) 1140.5 (8.8) 110.6 (0.9)   NG/GT 120    IV Piggyback 50    Total Intake(mL/kg) 1310.5 (10.2) 110.6 (0.9)   Emesis/NG output 300    Other     Blood     Total Output 300 0   Net  +1010.5 +110.6        Urine Occurrence 2 x      PHYSICAL EXAMINATION: General: Obese female, no distress Neuro: RAS 0. CAM-ICU neg for delirium. Moves all 4s.  HEENT: [place]/AT. PERRL, sclerae anicteric. Cardiovascular: RRR, no M/R/G.  Lungs: Respirations even and unlabored on vent.  CTA bilaterally, No W/R/R.  Abdomen: BS x 4, soft, NT/ND.  Musculoskeletal: RLE dressings in place, C/D/I. 1+ edema LLE. Skin: Intact, warm, no rashes.    LABS:  PULMONARY  Recent Labs Lab 09/13/13 1129  09/13/13 1451  09/17/13 0515 09/17/13 1339 09/17/13 1615 09/17/13 1857 09/18/13 0501  PHART  --   --  7.333*  --   --  7.389 7.392 7.354  --   PCO2ART  --   --  39.4  --   --  38.9 42.2 31.2*  --   PO2ART  --   --  74.0*  --   --  94.0 166.0* 107.0*  --   HCO3 22.8  --  21.0  --   --  23.5 25.6* 17.4*  --   TCO2 24  --  22  --   --  25 27 18   --   O2SAT 40.0  < > 94.0  < > 59.7 97.0 99.0 98.0 66.5  < > = values in this interval not displayed.  CBC  Recent Labs Lab 09/17/13 0500  09/17/13 1615 09/18/13 0430 09/19/13 0430  HGB 7.0*  < > 9.2* 8.7* 8.7*  HCT 22.5*  < > 27.0* 27.3* 26.9*  WBC 6.3  --   --  7.6 10.2  PLT 112*  --   --  148* 171  < > = values in this interval not displayed.  COAGULATION No results found for this basename: INR,  in the last 168 hours  CARDIAC  No results found for this basename: TROPONINI,  in the last 168 hours No results found for this basename: PROBNP,  in the last 168 hours   CHEMISTRY  Recent Labs Lab 09/15/13 0411  09/16/13 0447  09/17/13 0500  09/17/13 1615 09/17/13 1730 09/18/13 0430 09/18/13 1546 09/19/13 0430  NA  --   < > 137  < > 131*  < > 133* 134* 131* 132* 133*  K  --   < > 3.8  < > 4.1  < > 3.6* 4.1 4.0 4.1 3.9  CL  --   < > 98  < > 92*  --   --  96 94* 94* 95*  CO2  --   < > 27  < > 25  --   --  22 21 21 19   GLUCOSE  --   < > 253*  < > 270*  --   --  232* 245* 223* 187*  BUN  --   < > 18  < > 30*  --   --  16 20 25* 28*   CREATININE  --   < > 3.19*  < > 4.98*  --   --  3.15* 3.90* 4.57* 5.20*  CALCIUM  --   < > 7.4*  < > 7.5*  --   --  7.6* 7.6* 7.9* 7.5*  MG 2.1  --  1.9  --  2.0  --   --   --  1.8  --  1.8  PHOS  --   < > 3.4  < > 4.5  --   --  3.9 4.9* 4.9* 5.5*  < > = values in this interval not displayed. Estimated Creatinine Clearance: 19.3 ml/min (by C-G formula based on Cr of 5.2).   LIVER  Recent Labs Lab 09/13/13 0428  09/16/13 1600 09/17/13 0500 09/17/13 1730 09/18/13 1546 09/19/13 0430  AST 12  --   --  10  --   --   --   ALT 14  --   --  6  --   --   --   ALKPHOS 66  --   --  66  --   --   --   BILITOT 0.2*  --   --  0.3  --   --   --   PROT 5.5*  --   --  4.8*  --   --   --   ALBUMIN 2.3*  < > 1.9* 1.8* 2.3* 1.9* 1.8*  < > = values in this interval not displayed.   INFECTIOUS No results found for this basename: LATICACIDVEN, PROCALCITON,  in the last 168 hours   ENDOCRINE CBG (last 3)   Recent Labs  09/18/13 1710 09/18/13 2147 09/19/13 0805  GLUCAP 185* 179* 161*         IMAGING x48h  Dg Chest Port 1 View  09/18/2013   CLINICAL DATA:  Check endotracheal tube following readjustment  EXAM: PORTABLE CHEST - 1 VIEW  COMPARISON:  09/17/2013  FINDINGS: Cardiac shadow is stable. An endotracheal tube and nasogastric catheter are again seen. The endotracheal tube now lie is 3.5 cm above the carina. Patchy infiltrative changes are again seen predominately within the right lower lobe but stable from the prior exam. The left dialysis catheter is again identified and stable. A right-sided venous sheath is again seen and stable.  IMPRESSION: Interval enhancement of the endotracheal tube as described above.  The remainder of the exam is stable from the prior study.   Electronically Signed   By: Inez Catalina M.D.   On: 09/18/2013 11:05   Dg Chest Port 1 View  09/17/2013   CLINICAL DATA:  ETT placement  EXAM: PORTABLE CHEST - 1 VIEW  COMPARISON:  Chest radiograph 09/16/2013.   FINDINGS: Low lung volumes. Cardiac silhouette is enlarged. Endotracheal tube tip 7.6 cm above the carina projecting at the level of the clavicles. The patient's Swan-Ganz catheter has been removed. The left-sided central venous catheter stable. NG tube is appreciated tip not viewed on this study. Persistent left lower lobe consolidation. Decreased conspicuity of the right lower lobe density. There is prominence of the interstitial markings. No acute osseous abnormalities.  IMPRESSION: Endotracheal tube adequately positioned. Interval insertion of an NG tube and removal of the patient's Swan-Ganz catheter. The left-sided dialysis catheter stable.  Improved pulmonary edema.  Persistent left lower lobe density   Electronically Signed  By: Margaree Mackintosh M.D.   On: 09/17/2013 20:40      ASSESSMENT / PLAN:  PULMONARY A: VDRF S/p LLL lobectomy secondary to [place] Former smoker Severe COPD - Gold IV (PFT's 5/20: FEV1 28% pre BD, 36% post BD)   -s/p extubation 09/18/13 and doing well 24h later   P:   -pulmonary toilet  CARDIOVASCULAR A:  Cardiogenic Shock - now requiring levo and milrinone. Acute on chronic systolic heart failure Multivessel CAD- CVTS consulted - not a candidate for CABG due to poor lung function & shock Acute pulmonary edema/ volume overload   - on levophed and milrinone.   P:  - Cards following.    VASCULAR A: Right foot ischemia s/p  Right femoral endarterectomy, right femoral thrombectomy, four compartment fasciotomy Carotid stenosis - doppler 08/31/2013 shows R 60-79% ICA stenosis, L 80-99% ICA stenosis P: - Vascular following RLE , guarded prognosis for RLE  RENAL A:  ESRD - on CVVHD lt permacath P:   - Nephrology following.   GASTROINTESTINAL A:   Obesity P:   - diet per primary team - Pantoprazole.  HEMATOLOGIC A:   Anemia P:  - Goal Hgb > 8 given cardiac hx. - SCD's. - Manage per primary.  INFECTIOUS A:  No acute issues. P:   - Monitor  for fever / leukocytosis.  ENDOCRINE A:   DM P:   - SSI.  NEUROLOGIC A:   Acute Encephalopathy    - normal mental status 6.3/15  P:   --fent prn for pain  - avoid morphine, oxycodone in ICU setting with renal failure     GLOBAL No family at bedside. PCCM wil sign off  Dr. Brand Males, M.D., [facility].C.P Pulmonary and Critical Care Medicine Staff Physician [facility] Pager: [phone], If no answer or between  15:00h - 7:00h: call [phone]  09/19/2013 10:53 AM

## 2013-09-19 NOTE — Progress Notes (Addendum)
  [facility] Progress Note  09/19/2013 7:57 AM 2 Days Post-Op  Subjective:  C/o soreness  Afebrile HR 120's  50'V-697'X systolic 480% [id number]  Levophed   Filed Vitals:   09/19/13 0700  BP: 145/71  Pulse: 39  Temp:   Resp: 19    Physical Exam: Incisions:  Right groin incision is c/d/i with dry gauze in place; wound vacs are in place with good seal to medial and lateral site. Extremities:  + right DP>PT doppler signal;  Sensation in tact on toes; decreased on dorsum of foot.  + plantar flexion, but very minimal dorsiflexion.    CBC    Component Value Date/Time   WBC 10.2 09/19/2013 0430   RBC 2.89* 09/19/2013 0430   HGB 8.7* 09/19/2013 0430   HCT 26.9* 09/19/2013 0430   PLT 171 09/19/2013 0430   MCV 93.1 09/19/2013 0430   MCH 30.1 09/19/2013 0430   MCHC 32.3 09/19/2013 0430   RDW 17.1* 09/19/2013 0430   LYMPHSABS 1.4 08/04/2013 1223   MONOABS 0.4 08/04/2013 1223   EOSABS 0.2 08/04/2013 1223   BASOSABS 0.1 08/04/2013 1223    BMET    Component Value Date/Time   NA 133* 09/19/2013 0430   K 3.9 09/19/2013 0430   CL 95* 09/19/2013 0430   CO2 19 09/19/2013 0430   GLUCOSE 187* 09/19/2013 0430   BUN 28* 09/19/2013 0430   CREATININE 5.20* 09/19/2013 0430   CREATININE 4.17* 07/06/2013 1618   CALCIUM 7.5* 09/19/2013 0430   GFRNONAA 9* 09/19/2013 0430   GFRNONAA 71 07/13/2011 0835   GFRAA 11* 09/19/2013 0430   GFRAA 82 07/13/2011 0835    INR    Component Value Date/Time   INR 1.13 09/06/2013 0740     Intake/Output Summary (Last 24 hours) at 09/19/13 0757 Last data filed at 09/19/13 0600  Gross per 24 hour  Intake   1273 ml  Output    300 ml  Net    973 ml     Assessment:  43 y.o. female is s/p:  Right femoral endarterectomy, right femoral thrombectomy, four compartment fasciotomy  2 Days Post-Op  Plan: -pt doing well from surgical stand point. Possibility of fasciotomy closures today-will be under local anesthesia (pt doesn't want the vent); pt is NPO -DVT prophylaxis:   SCD's for now since high risk for bleeding post femoral endarterectomy.   -wound vacs to fasciotomies     Leontine Locket, PA-C [facility] [phone] 09/19/2013 7:57 AM   Agree with above.  Foot viable not surprising she currently has foot drop given prolonged ischemia. Will close fasciotomies in OR midday today.  Ruta Hinds, MD [facility] [place] Office: [phone] Pager: [phone]

## 2013-09-19 NOTE — Anesthesia Postprocedure Evaluation (Signed)
  Anesthesia Post-op Note  Patient: Hannah [name]  Procedure(s) Performed: Procedure(s) with comments: FASCIOTOMY CLOSURE (Right) - regional block and monitored anesthesia care used  Patient Location: PACU  Anesthesia Type:MAC and Regional  Level of Consciousness: awake and alert   Airway and Oxygen Therapy: Patient Spontanous Breathing  Post-op Pain: none  Post-op Assessment: Post-op Vital signs reviewed, Patient's Cardiovascular Status Stable, Respiratory Function Stable, Patent Airway, No signs of Nausea or vomiting and Pain level controlled  Post-op Vital Signs: Reviewed and stable  Last Vitals:  Filed Vitals:   09/19/13 1430  BP: 112/48  Pulse:   Temp:   Resp: 17    Complications: No apparent anesthesia complications

## 2013-09-19 NOTE — Anesthesia Procedure Notes (Addendum)
Procedure Name: Intubation Date/Time: 09/19/2013 8:40 PM Performed by: Trixie Deis A Pre-anesthesia Checklist: Patient identified, Emergency Drugs available, Suction available, Patient being monitored and Timeout performed Patient Re-evaluated:Patient Re-evaluated prior to inductionOxygen Delivery Method: Ambu bag Preoxygenation: Pre-oxygenation with 100% oxygen Intubation Type: IV induction, Rapid sequence and Cricoid Pressure applied Laryngoscope size: McGrath size 3. Grade View: Grade I Tube type: Subglottic suction tube Tube size: 7.5 mm Number of attempts: 2 Placement Confirmation: ETT inserted through vocal cords under direct vision,  CO2 detector and breath sounds checked- equal and bilateral Secured at: 21 (corner mouth) cm Tube secured with: Tape Dental Injury: Teeth and Oropharynx as per pre-operative assessment  Comments: 7.5 subglottic OETT placed using McGrath 3 without issue- cuff noted to be damaged after tube placement confirmed. Using bougie stylet and McGrath 3, a new 7.5 subglottic OETT was placed under direct vision without issue and tube placement confirmed.

## 2013-09-19 NOTE — Transfer of Care (Signed)
Immediate Anesthesia Transfer of Care Note  Patient: Hannah [name]  Procedure(s) Performed: Procedure(s) with comments: FASCIOTOMY CLOSURE (Right) - regional block and monitored anesthesia care used  Patient Location: PACU  Anesthesia Type:MAC and Regional  Level of Consciousness: awake, alert , oriented and patient cooperative  Airway & Oxygen Therapy: Patient Spontanous Breathing and Patient connected to nasal cannula oxygen  Post-op Assessment: Report given to PACU RN, Post -op Vital signs reviewed and stable and Patient moving all extremities X 4  Post vital signs: Reviewed and stable  Complications: No apparent anesthesia complications

## 2013-09-19 NOTE — Progress Notes (Addendum)
ANTICOAGULATION CONSULT NOTE - Initial Consult  Pharmacy Consult:  Argatroban Indication:  Thromboembolic state  Allergies  Allergen Reactions  . Crestor [Rosuvastatin] Other (See Comments)    Severe muscle weakness  . Nsaids Other (See Comments)    Not allergic, "bad on my kidneys"  . Ciprofloxacin Rash    Patient Measurements: Height: 5' 7.5" (171.5 cm) Weight: 284 lb 6.3 oz (129 kg) IBW/kg (Calculated) : 62.75  Vital Signs: Temp: 98 F (36.7 C) (06/03 1546) Temp src: Oral (06/03 1546) BP: 95/74 mmHg (06/03 1630) Pulse Rate: 123 (06/03 1545)  Labs:  Recent Labs  09/17/13 0500  09/17/13 1615  09/18/13 0430 09/18/13 1546 09/19/13 0430 09/19/13 1408  HGB 7.0*  < > 9.2*  --  8.7*  --  8.7*  --   HCT 22.5*  < > 27.0*  --  27.3*  --  26.9*  --   PLT 112*  --   --   --  148*  --  171  --   APTT 37  --   --   --  37  --  21*  --   CREATININE 4.98*  --   --   < > 3.90* 4.57* 5.20* 5.92*  < > = values in this interval not displayed.  Estimated Creatinine Clearance: 16.9 ml/min (by C-G formula based on Cr of 5.92).   Medical History: Past Medical History  Diagnosis Date  . High cholesterol   . Diabetic retinopathy   . Peripheral neuropathy     "tips of toes"  . Blind right eye   . CHF (congestive heart failure)   . CAD (coronary artery disease)   . GERD (gastroesophageal reflux disease)   . Hypertension   . History of lung cancer 07/2011    s/p left lower lobectomy  . Diabetes mellitus     IDDM  . Cataract of right eye   . Ulcer of toe of right foot 07/10/2012    great toe  . Breast calcification, left 06/2012  . Acute biphenotypic leukemia   . CKD (chronic kidney disease) stage 5, GFR less than 15 ml/min   . Nephrotic syndrome   . Gastritis     H/o gastritis on prior endoscopy  . Anemia   . Carotid artery disease       Assessment: 54 YOF with right foot ischemia s/p femoral endarterectomy and thrombectomy on 09/17/13 to start argatroban for  anticoagulation.  Also s/p fasciotomy closure on 09/19/13.  Although patient's SRA is negative, Cards and Heme opted to start a DTI.  LFTs WNL and platelets have recovered.   Goal of Therapy:  aPTT 50 - 90 seconds Monitor platelets by anticoagulation protocol: Yes    Plan:  - At 1930 (6 hrs after AET), start argatroban at 0.5 mcg/kg/min = 3.9 ml/hr - Check aPTT 2 hrs after gtt started - Daily aPTT / CBC    Johnney Scarlata D. Mina Marble, PharmD, BCPS Pager:  [phone] 09/19/2013, 5:47 PM    ===========================================   Addendum: - aPTT 37, sub-therapeutic - no bleeding - s/p PEA arrest, now intubated    Plan: - Increase argatroban by 30% to 0.65 mcg/kg/min = 5 ml/hr - Check APTT 2 hrs post rate change    Birdie Beveridge D. Mina Marble, PharmD, BCPS Pager:  [phone] 09/19/2013, 10:15 PM

## 2013-09-19 NOTE — Consult Note (Signed)
[facility] CONSULT NOTE CHIEF COMPLAINTS/PURPOSE OF CONSULTATION:  Recurrent thrombosis  HISTORY OF PRESENTING ILLNESS:  I was consulted on this patient due to her recent history of recurrent thrombosis. This patient has a complicated history of poorly controlled diabetes, severe peripheral vascular disease with cardiomyopathy and severe coronary artery disease, nephrotic syndrome with worsening kidney failure and background history of stage I lung cancer status post surgery.  The patient was admitted since 09/03/2013 after she was found to have high-risk abnormal stress test with new cardiomyopathy. She also has history of orthopnea, exertional dyspnea and leg swelling at the time of admission with acute on chronic renal failure.  Coronary angiogram dated 09/04/2013 confirmed severe three-vessel disease. She was evaluated by her cardiothoracic surgeon and was noted to have history of severe emphysema/COPD due to chronic smoking history.  During the course of admission, due to evidence of heart failure, fluid overload and worsening renal function, hemodialysis was recommended to treat the fluid overload. The patient was too frail to undergo combined carotid antrectomy and multivessel coronary artery bypass surgery.   Intravenous heparin was started on 09/03/2013. On 09/06/2013, she has placement of central venous catheter on the right internal jugular vein. On 09/12/2013, she has placement of hemodialysis catheter to the left internal jugular vein. It was noted that she has progressive renal failure. On 09/13/2013, she has insertion of intra-aortic balloon pump for heart failure with presence of cardiogenic shock. On 09/13/2013, hemodialysis was attempted but treatment was stopped due to catheter malfunction. On 09/14/2013, she started to lose pulses in the right foot with progressive peripheral cyanosis. Intra-aortic balloon pump was subsequently removed. She was also noted to  have progressive thrombocytopenia. On 09/17/2013, due to have progressive ischemia to the right foot, she underwent right femoral nephrectomy, thrombectomy and 4 compartment fasciotomy. She was intubated and supported in the ICU.  Her baseline platelet count on 09/13/2013 was 222,000. Starting on 09/14/2013, she has precipitous drop of her platelet count to as low as 90,000 on 09/16/2013. 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 Heparin was discontinued.   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 Subsequent followup with Serotonin Release Assay was negative.  Today, she was noted to have right upper extremity edema. Her central line on the right side was removed and ultrasound was ordered at the time of dictation. I am consulted for presumed DVT of the right upper extremity and to determine the cause of her recurrent thrombosis.  At present time, she denies warmth, tenderness & erythema in her right foot. She has persistent bilateral lower extremity edema. She denies recent chest pain on exertion, shortness of breath on minimal exertion, pre-syncopal episodes, hemoptysis, or palpitation.  She had prior surgeries before and never had perioperative thromboembolic events. The patient had been pregnant before with C-section x2 and denies peripartum thromboembolic event or history of recurrent miscarriages. There is no family history of blood clots or miscarriages.  MEDICAL HISTORY:  Past Medical History  Diagnosis Date  . High cholesterol   . Diabetic retinopathy   . Peripheral neuropathy     "tips of toes"  . Blind right eye   . CHF (congestive heart failure)   . CAD (coronary artery disease)   . GERD (gastroesophageal reflux disease)   . Hypertension   . History of lung cancer 07/2011    s/p left lower lobectomy  . Diabetes mellitus     IDDM  . Cataract of right eye   . Ulcer of toe  of right foot 07/10/2012    great toe  . Breast calcification, left 06/2012  . Acute  biphenotypic leukemia   . CKD (chronic kidney disease) stage 5, GFR less than 15 ml/min   . Nephrotic syndrome   . Gastritis     H/o gastritis on prior endoscopy  . Anemia   . Carotid artery disease     SURGICAL HISTORY: Past Surgical History  Procedure Laterality Date  . Cardiac catheterization  07/16/2011  . Incision and drainage breast abscess Left   . Tubal ligation  1994  . Vitrectomy  2010    2 on left, 1 on right  . Cesarean section  1991; 1994  . Video assisted thoracoscopy (vats)/ lobectomy Left 07/30/2011    left main thoracotomy, left lower lobectomy, mediastinal lymph node dissection  . Lobectomy    . Colonoscopy with esophagogastroduodenoscopy (egd) N/A 08/14/2012    Procedure: COLONOSCOPY WITH ESOPHAGOGASTRODUODENOSCOPY (EGD);  Surgeon: Danie Binder, MD;  [facility];  Service: Endoscopy;  Laterality: N/A;  10:45-moved to [address] to notify pt  . Breast lumpectomy with needle localization Left 11/14/2012    Procedure: BREAST LUMPECTOMY WITH NEEDLE LOCALIZATION;  Surgeon: Marcello Moores A. Cornett, MD;  Location: [place];  Service: General;  Laterality: Left;  . Insertion of dialysis catheter Left 09/12/2013    Procedure: INSERTION OF DIALYSIS CATHETER;  Surgeon: Mal Misty, MD;  Location: [place];  Service: Vascular;  Laterality: Left;  . Embolectomy Right 09/17/2013    Procedure: Thrombectomy of Right Common Femoral Artery;  Surgeon: Elam Dutch, MD;  Location: [facility] OR;  Service: Vascular;  Laterality: Right;  . Endarterectomy femoral Right 09/17/2013    Procedure: Right Femoral Endarterectomy;  Surgeon: Elam Dutch, MD;  Location: [facility] OR;  Service: Vascular;  Laterality: Right;  . Patch angioplasty Right 09/17/2013    Procedure: Vein Patch Angioplasty of Right Femoral Artery;  Surgeon: Elam Dutch, MD;  Location: [place];  Service: Vascular;  Laterality: Right;  . Fasciotomy Right 09/17/2013    Procedure: Four Compartment Fasciotomy;  Surgeon: Elam Dutch, MD;  Location: [place];  Service: Vascular;  Laterality: Right;    SOCIAL HISTORY: History   Social History  . Marital Status: Married    Spouse Name: N/A    Number of Children: 3  . Years of Education: N/A   Occupational History  . Not on file.   Social History Main Topics  . Smoking status: Former Smoker -- 2.00 packs/day for 30 years    Quit date: 08/01/2011  . Smokeless tobacco: Never Used  . Alcohol Use: No  . Drug Use: No  . Sexual Activity: Yes   Other Topics Concern  . Not on file   Social History Narrative  . No narrative on file     FAMILY HISTORY: Family History  Problem Relation Age of Onset  . Coronary artery disease Father   . Asthma Father   . COPD Father   . Hypertension Father   . Hyperlipidemia Father   . Diabetes Father   . Congestive Heart Failure Father   . Heart disease Father     before age 19  . Heart attack Father   . Peripheral vascular disease Father   . Hypertension Mother   . Hyperlipidemia Mother   . Diabetes Mother   . Cancer Mother   . Cancer Maternal Aunt      three aunts, bone, breast, ?  . Hypertension Brother   .  Diabetes Brother   . Diabetes Sister   . Colon cancer Neg Hx   . Celiac disease Neg Hx   . Crohn's disease Neg Hx   . Ulcerative colitis Neg Hx   . Lung cancer Maternal Grandmother     ALLERGIES: Crestor; Nsaids; and Ciprofloxacin  MEDICATIONS:  Current Facility-Administered Medications  Medication Dose Route Frequency Provider Last Rate Last Dose  . 0.9 %  sodium chloride infusion  250 mL Intravenous PRN Evelene Croon Barrett, PA-C 1,000 mL/hr at 09/07/13 2120 1,000 mL at 09/07/13 2120  . 0.9 %  sodium chloride infusion   Intravenous Continuous PRN Jolaine Artist, MD 10 mL/hr at 09/19/13 1800    . 0.9 %  sodium chloride infusion  100 mL Intravenous PRN Joyice Faster Deterding, MD      . 0.9 %  sodium chloride infusion  100 mL Intravenous PRN Joyice Faster Deterding, MD      . 0.9 %  sodium chloride infusion   250 mL Intravenous PRN Jolaine Artist, MD      . 0.9 %  sodium chloride infusion  100 mL Intravenous PRN Joyice Faster Deterding, MD      . 0.9 %  sodium chloride infusion  100 mL Intravenous PRN Joyice Faster Deterding, MD      . 0.9 %  sodium chloride infusion  100 mL Intravenous PRN Joyice Faster Deterding, MD      . 0.9 %  sodium chloride infusion  100 mL Intravenous PRN Placido Sou, MD      . acetaminophen (TYLENOL) tablet 650 mg  650 mg Oral Q4H PRN Evelene Croon Barrett, PA-C   650 mg at 09/19/13 0551  . ALPRAZolam Duanne Moron) tablet 0.25 mg  0.25 mg Oral BID PRN Evelene Croon Barrett, PA-C   0.25 mg at 09/09/13 1230  . amiodarone (NEXTERONE PREMIX) 360 MG/200ML (1.8 mg/mL) IV infusion  30 mg/hr Intravenous Continuous Shaune Pascal Bensimhon, MD 16.7 mL/hr at 09/19/13 1800 30 mg/hr at 09/19/13 1800  . amiodarone (PACERONE) tablet 200 mg  200 mg Oral BID Jolaine Artist, MD      . antiseptic oral rinse (BIOTENE) solution 15 mL  15 mL Mouth Rinse BID Brand Males, MD   15 mL at 09/19/13 1958  . argatroban 1 mg/mL infusion  0.5 mcg/kg/min Intravenous Continuous Saundra Shelling, RPH 3.9 mL/hr at 09/19/13 1947 0.5 mcg/kg/min at 09/19/13 1947  . aspirin chewable tablet 81 mg  81 mg Oral Daily Jettie Booze, MD   81 mg at 09/18/13 1136  . atorvastatin (LIPITOR) tablet 80 mg  80 mg Oral q1800 Jolaine Artist, MD   80 mg at 09/19/13 1718  . calcitRIOL (ROCALTROL) 1 MCG/ML solution 0.25 mcg  0.25 mcg Oral Daily Jettie Booze, MD   0.25 mcg at 09/19/13 1718  . cyclobenzaprine (FLEXERIL) tablet 5-10 mg  5-10 mg Oral TID PRN Evelene Croon Barrett, PA-C   10 mg at 09/16/13 2015  . darbepoetin (ARANESP) injection 200 mcg  200 mcg Subcutaneous Q Wed-1800 Placido Sou, MD   200 mcg at 09/19/13 1807  . dextrose (GLUTOSE) 40 % oral gel 37.5 g  1 Tube Oral PRN Jettie Booze, MD   37.5 g at 09/04/13 0806  . feeding supplement (NEPRO CARB STEADY) liquid 237 mL  237 mL Oral PRN Placido Sou, MD      .  fentaNYL (SUBLIMAZE) 10 mcg/mL in sodium chloride 0.9 % 250 mL infusion  0-400 mcg/hr  Intravenous Continuous Rahul P Desai, PA-C 5 mL/hr at 09/19/13 2135 50 mcg/hr at 09/19/13 2135  . fentaNYL (SUBLIMAZE) bolus via infusion 50-100 mcg  50-100 mcg Intravenous Q1H PRN Rahul P Desai, PA-C      . fentaNYL (SUBLIMAZE) injection 25 mcg  25 mcg Intravenous Once Jolaine Artist, MD      . fentaNYL (SUBLIMAZE) injection 25-100 mcg  25-100 mcg Intravenous Q2H PRN Rigoberto Noel, MD   25 mcg at 09/18/13 1509  . fentaNYL (SUBLIMAZE) injection 50 mcg  50 mcg Intravenous Once Jettie Booze, MD      . fentaNYL (SUBLIMAZE) injection 50 mcg  50 mcg Intravenous Once Rahul P Desai, PA-C      . insulin aspart (novoLOG) injection 0-9 Units  0-9 Units Subcutaneous TID WC Jolaine Artist, MD   1 Units at 09/19/13 1718  . insulin aspart (novoLOG) injection 3 Units  3 Units Subcutaneous TID WC Jolaine Artist, MD   3 Units at 09/19/13 1719  . insulin detemir (LEVEMIR) injection 10 Units  10 Units Subcutaneous q morning - 10a Placido Sou, MD   10 Units at 09/19/13 1400  . insulin detemir (LEVEMIR) injection 34 Units  34 Units Subcutaneous QHS Jolaine Artist, MD   34 Units at 09/18/13 2238  . lidocaine (PF) (XYLOCAINE) 1 % injection 5 mL  5 mL Other Once Jolaine Artist, MD      . lidocaine (PF) (XYLOCAINE) 1 % injection 5 mL  5 mL Intradermal PRN Placido Sou, MD      . lidocaine-prilocaine (EMLA) cream 1 application  1 application Topical PRN Placido Sou, MD      . lidocaine-prilocaine (EMLA) cream 1 application  1 application Topical PRN Placido Sou, MD      . metoprolol (LOPRESSOR) injection 2.5 mg  2.5 mg Intravenous Once Jolaine Artist, MD      . midazolam (VERSED) injection 1 mg  1 mg Intravenous Q6H PRN Jolaine Artist, MD   1 mg at 09/16/13 2259  . milrinone (PRIMACOR) infusion 200 mcg/mL (0.2 mg/ml)  0.25 mcg/kg/min Intravenous Continuous Jolaine Artist, MD 9.7  mL/hr at 09/19/13 1957 0.25 mcg/kg/min at 09/19/13 1957  . nitroGLYCERIN (NITROSTAT) SL tablet 0.4 mg  0.4 mg Sublingual Q5 Min x 3 PRN Rhonda G Barrett, PA-C      . norepinephrine (LEVOPHED) 16 mg in dextrose 5 % 250 mL infusion  2-50 mcg/min Intravenous Titrated Jolaine Artist, MD 2.8 mL/hr at 09/19/13 1800 2.987 mcg/min at 09/19/13 1800  . ondansetron (ZOFRAN) injection 4 mg  4 mg Intravenous Q6H PRN Evelene Croon Barrett, PA-C   4 mg at 09/13/13 0807  . pantoprazole sodium (PROTONIX) 40 mg/20 mL oral suspension 40 mg  40 mg Per Tube Daily Jettie Booze, MD   40 mg at 09/18/13 1130  . pentafluoroprop-tetrafluoroeth (GEBAUERS) aerosol 1 application  1 application Topical PRN Joyice Faster Deterding, MD      . polyethylene glycol (MIRALAX / GLYCOLAX) packet 17 g  17 g Oral Daily PRN Placido Sou, MD   17 g at 09/16/13 0943  . polyvinyl alcohol (LIQUIFILM TEARS) 1.4 % ophthalmic solution 1 drop  1 drop Both Eyes PRN Veryl Speak, MD      . sodium chloride 0.9 % injection 10-40 mL  10-40 mL Intracatheter Q12H Jolaine Artist, MD   10 mL at 09/19/13 1426  . sodium chloride 0.9 % injection 10-40 mL  10-40 mL Intracatheter PRN Jolaine Artist, MD   20 mL at 09/11/13 1514  . sodium chloride 0.9 % injection 3 mL  3 mL Intravenous Q12H Rhonda G Barrett, PA-C   3 mL at 09/19/13 1426  . sodium chloride 0.9 % injection 3 mL  3 mL Intravenous PRN Evelene Croon Barrett, PA-C   3 mL at 09/18/13 2240  . sodium chloride 0.9 % injection 3 mL  3 mL Intravenous Q12H Jolaine Artist, MD   3 mL at 09/18/13 2240  . sodium chloride 0.9 % injection 3 mL  3 mL Intravenous PRN Jolaine Artist, MD      . traMADol Veatrice Bourbon) tablet 50 mg  50 mg Oral Q6H PRN Rande Brunt, NP   50 mg at 09/19/13 1806  . zolpidem (AMBIEN) tablet 5 mg  5 mg Oral QHS PRN Evelene Croon Barrett, PA-C         REVIEW OF SYSTEMS:   Constitutional: Denies fevers, chills or abnormal night sweats Eyes: Denies blurriness of vision, double vision  or watery eyes. Of note, the patient is blind in the right eye. Ears, nose, mouth, throat, and face: Denies mucositis. She has mild sore throat from her recent intubation. Respiratory: Denies cough, dyspnea or wheezes Cardiovascular: Denies palpitation, chest discomfort Gastrointestinal:  Denies nausea, heartburn. She has constipation  Skin: Denies abnormal skin rashes Lymphatics: Denies new lymphadenopathy or easy bruising Neurological:Denies numbness, tingling or new weaknesses Behavioral/Psych: Mood is stable, no new changes  All other systems were reviewed with the patient and are negative.  PHYSICAL EXAMINATION: ECOG PERFORMANCE STATUS: 4 - Bedbound Filed Vitals:   09/19/13 1958  BP:   Pulse:   Temp: 98.3 F (36.8 C)  Resp:     GENERAL:alert, no distress and comfortable.She is morbidly obese SKIN: Noted mottled skin appearance with peripheral cyanosis. No suspicious skin rashes. No skin necrosis. EYES: normal, conjunctiva are pink and non-injected, sclera clear OROPHARYNX:no exudate, no erythema and lips, buccal mucosa, and tongue normal  NECK: supple, thyroid normal size, non-tender, without nodularity LYMPH:  no palpable lymphadenopathy in the cervical, axillary or inguinal LUNGS: clear to auscultation and percussion with normal breathing effort HEART: Tachycardia. Significant bilateral lower extremity edema. ABDOMEN:abdomen soft, non-tender and normal bowel sounds Musculoskeletal: no clubbing  PSYCH: alert & oriented x 3 with fluent speech NEURO: no focal motor/sensory deficits  LABORATORY DATA:  I have reviewed the data as listed CBC Latest Ref Rng 09/19/2013 09/18/2013 09/17/2013  WBC 4.0 - 10.5 K/uL 10.2 7.6 -  Hemoglobin 12.0 - 15.0 g/dL 8.7(L) 8.7(L) 9.2(L)  Hematocrit 36.0 - 46.0 % 26.9(L) 27.3(L) 27.0(L)  Platelets 150 - 400 K/uL 171 148(L) -   RADIOGRAPHIC STUDIES: I reviewed the most recent CT scan of the chest dated May 2015 I have personally reviewed the  radiological images as listed and agreed with the findings in the report.   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 Haroldine Laws. I do not believe the patient had recent heparin induced thrombocytopenia. The drop of platelet count recently coincide with the  insertion of intra-aortic balloon pump which rebounded quickly upon discontinuation. I am concerned about her right upper extremity swelling and I suspect she may have a new clot. The patient recently had recurrent thrombosis to the point she had threatened lower extremity ischemia. She has heavy proteinuria in the nephrotic range prior to admission.  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  I would recommend anticoagulation therapy with IV argatroban for the next 24-48 hours and transition her to warfarin if there is no plan for future surgical interventions. Goal of INR 2-3, for minimum 6 months or longer depending on presence of risk factors for recurrent thrombosis.  All questions were answered.   Please do not hesitate to call me if questions arise.  Heath Lark, MD 09/19/2013

## 2013-09-19 NOTE — Op Note (Signed)
Procedure: Closure of right leg fasciotomies  Preoperative diagnosis: Open wound right leg  Postoperative diagnosis: Same  Anesthesia Local with block.  Assistant: Nurse  And occasions: Patient is a 43 year old female who had bilateral 4 compartment fasciotomies performed several days ago for compartment syndrome. She returns today to the operating room for closure of the fasciotomies.  Operative details: After obtaining informed consent, the patient was taken to the operating room. The patient was placed in supine position on operating table. After placement of a block by the anesthesia team, the right lower extremity was prepped and draped in the usual sterile fashion. The lateral and medial fasciotomy wounds were inspected. The muscle was pink and viable. There was essentially minimal tension on the skin edges. Local anesthesia was infiltrated in the form of 1% lidocaine 1/4% marcaine in the skin edges.  I then proceeded to irrigate both wounds thoroughly with a liter of normal saline solution. The skin edges were then reapproximated with staples. The patient tolerated the procedure well and there were no complications. Instrument sponge and needle counts were correct at the end of the case. The patient was taken to the recovery room in stable condition.  Ruta Hinds, MD [facility] Office: [phone] Pager: [phone]

## 2013-09-19 NOTE — Progress Notes (Signed)
ADVANCED HEART FAILURE ROUNDING NOTE   SUBJECTIVE  43 yo morbidly obese female with PMH of DM1, stage V kidney disease, blindness, h/o lung CA s/p resection who was recently discharged for heart failure symptom had a high risk stress test which showed ischemia in anterior and apical region. She underwent cath 09/04/2013 which showed chronically occluded RCA and triple vessel dx. CT surgery consulted but not felt to be surgical candidate due to poor PFTs, comorbidities and shock. EF 22% by Myoview. 30-35% by echo  Underwent placement of swan and IABP last week for shock (co-ox 38%) last week. Unfortunately developed cold foot and IABP had to be removed.Getting intermittent HD and tolerating it.   Underwent R femoral thrombectomy and 4 compartment fasciotomy of RLE on 6/1. Just got out of OR after fasciotomy closure.   Feels ok. Denies CP or SOB. R arm continues to swell and unable to pull blood back from R subclavian TLC. Remains on levophed and milrinone.    CURRENT MEDS . antiseptic oral rinse  15 mL Mouth Rinse BID  . aspirin  81 mg Oral Daily  . atorvastatin  80 mg Oral q1800  . calcitRIOL  0.25 mcg Oral Daily  . darbepoetin (ARANESP) injection - NON-DIALYSIS  200 mcg Subcutaneous Q Wed-1800  . fentaNYL  25 mcg Intravenous Once  . fentaNYL  50 mcg Intravenous Once  . insulin aspart  0-9 Units Subcutaneous TID WC  . insulin aspart  3 Units Subcutaneous TID WC  . insulin detemir  10 Units Subcutaneous q morning - 10a  . insulin detemir  34 Units Subcutaneous QHS  . lidocaine (PF)  5 mL Other Once  . metoprolol  2.5 mg Intravenous Once  . pantoprazole sodium  40 mg Per Tube Daily  . sodium chloride  10-40 mL Intracatheter Q12H  . sodium chloride  3 mL Intravenous Q12H  . sodium chloride  3 mL Intravenous Q12H    OBJECTIVE  Filed Vitals:   09/19/13 0500 09/19/13 0600 09/19/13 0700 09/19/13 0807  BP: 113/75 118/59 145/71   Pulse:   39   Temp:    97.5 F (36.4 C)  TempSrc:     Oral  Resp: 15 20 19    Height:      Weight:   284 lb 6.3 oz (129 kg)   SpO2:   100%     Intake/Output Summary (Last 24 hours) at 09/19/13 0839 Last data filed at 09/19/13 0600  Gross per 24 hour  Intake  997.2 ml  Output    100 ml  Net  897.2 ml   Filed Weights   09/17/13 0812 09/18/13 0500 09/19/13 0700  Weight: 261 lb 11 oz (118.7 kg) 277 lb 12.5 oz (126 kg) 284 lb 6.3 oz (129 kg)    PHYSICAL EXAM  General:Lying flat in bed. NAD Neuro: Alert and oriented X 3. Moves all extremities spontaneous HEENT:  Normal  Neck: Supple without bruits. Chest L subclav PC. R subclav TLC. Unable to see JVD Lungs:  clear Heart: tachy regular. +s3 Abdomen: Soft, non-tender, non-distended, BS + x 4.  Extremities: Mild edema. R foot with good DP pulse. Dressing on calf. R arm swelling (no DVT by u/s)   CBC  Recent Labs  09/18/13 0430 09/19/13 0430  WBC 7.6 10.2  HGB 8.7* 8.7*  HCT 27.3* 26.9*  MCV 95.8 93.1  PLT 148* 716   Basic Metabolic Panel  Recent Labs  09/18/13 0430 09/18/13 1546 09/19/13 0430  NA 131* 132*  133*  K 4.0 4.1 3.9  CL 94* 94* 95*  CO2 21 21 19   GLUCOSE 245* 223* 187*  BUN 20 25* 28*  CREATININE 3.90* 4.57* 5.20*  CALCIUM 7.6* 7.9* 7.5*  MG 1.8  --  1.8  PHOS 4.9* 4.9* 5.5*   Liver Function Tests  Recent Labs  09/17/13 0500  09/18/13 1546 09/19/13 0430  AST 10  --   --   --   ALT 6  --   --   --   ALKPHOS 66  --   --   --   BILITOT 0.3  --   --   --   PROT 4.8*  --   --   --   ALBUMIN 1.8*  < > 1.9* 1.8*  < > = values in this interval not displayed. Cardiac Enzymes No results found for this basename: CKTOTAL, CKMB, CKMBINDEX, TROPONINI,  in the last 72 hours Thyroid Function Tests No results found for this basename: TSH, T4TOTAL, FREET3, T3FREE, THYROIDAB,  in the last 72 hours  TELE  Sinus tachycardia 110-120s   Radiology/Studies  Dg Chest 2 View  09/06/2013   CLINICAL DATA:  CHF, intermittent dyspnea  EXAM: CHEST  2 VIEW   COMPARISON:  Prior chest x-ray 08/13/2013; prior chest CT 08/22/2013  FINDINGS: Stable cardiac and mediastinal contours. Atherosclerotic calcifications are present within the transverse aorta. Moderate left and small right pleural effusions are similar compared to prior. There is persistent associated bibasilar atelectasis. Mild pulmonary vascular congestion without overt edema. No pneumothorax. No new focal airspace consolidation. No acute osseous abnormality.  IMPRESSION: 1. Stable moderate left and small right pleural effusions and associated bibasilar atelectasis. 2. Pulmonary vascular congestion without overt edema.   Electronically Signed   By: Jacqulynn Cadet M.D.   On: 09/06/2013 07:55   Ct Chest Wo Contrast  08/22/2013   CLINICAL DATA:  Left lung cancer status post lobectomy. Weight gain. Renal insufficiency.  EXAM: CT CHEST WITHOUT CONTRAST  TECHNIQUE: Multidetector CT imaging of the chest was performed following the standard protocol without IV contrast.  COMPARISON:  Radiographs 08/13/2013 and 08/04/2013.  CT 08/02/2012.  FINDINGS: There are stable postsurgical changes related to prior left lower lobe resection. There has been interval enlargement of several mediastinal lymph nodes. These include 12 mm right paratracheal (image 19), 13 mm precarinal (image 23), and 14 mm AP window (image 25) lymph nodes. Some of these nodes have retained fatty hila. Allowing for the limitations of noncontrast technique, the hila appear stable.  There is stable low-density within the right thyroid lobe. Atherosclerosis of the aorta, great vessels and coronary arteries is noted. The heart size is normal. There is no pericardial effusion.  Moderate size dependent pleural effusions are present bilaterally. There is associated dependent airspace disease in both lower lobes. In addition, there are more focal airspace opacities within the right lower lobe and inferior aspect of the left upper lobe. The latter is somewhat  nodular, measuring up to 1.8 cm on image 43. No other focal nodularity or endobronchial lesions are demonstrated.  The visualized upper abdomen has a stable appearance. There is no adrenal mass. There is increased subcutaneous edema throughout the subcutaneous fat, especially within the anterior aspect of the upper abdomen.  No worrisome osseous findings are demonstrated.  IMPRESSION: 1. As demonstrated radiographically, there are bilateral pleural effusions and bilateral airspace opacities which are new compared with the prior CT. Although there are focal somewhat nodular components in the right lower and  left upper lobes, these findings are most likely infectious/inflammatory. 2. Progressive mediastinal lymphadenopathy. Some of the nodes have retained fatty hila and may be reactive. Metastatic disease cannot be completely excluded. 3. Stable atherosclerosis and low-density within the right thyroid lobe. 4. If the airspace opacities and pleural effusions fail to respond to appropriate clinical therapy, follow-up CT or PET-CT may be warranted to exclude metastatic disease.   Electronically Signed   By: Camie Patience M.D.   On: 08/22/2013 10:26   Dg Chest Portable 1 View  08/13/2013   CLINICAL DATA:  Shortness of breath increasing for 2 weeks. History of CHF.  EXAM: PORTABLE CHEST - 1 VIEW  COMPARISON:  08/04/2013  FINDINGS: Again noted are bibasilar lung densities that are suggestive for pleural effusions and atelectasis. Heart size appears to be enlarged. There is some peribronchial thickening and cannot exclude pulmonary edema.  IMPRESSION: Bilateral pleural effusions with basilar atelectasis. There is mild pulmonary edema. Minimal change from the previous examination.   Electronically Signed   By: Markus Daft M.D.   On: 08/13/2013 23:10    ASSESSMENT AND PLAN 1. Cardiogenic shock 2. Acute on chronic systolic HF     EF ~48-88% 3. 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 Acute respiratory failure 5. 3-V CAD 6. Carotid  stenosis     - doppler 08/31/2013 shows R 60-79% ICA stenosis, L 80-99% ICA stenosis     - vascular surgery aware 7. 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 DM1 9. Ischemic R foot s/p R femoral thrombectomy and 4-compartment fasciotomy.   Stable from cardiac standpoint though she remains on milrinone and levophed. Unable to get co-ox due to clotted TLC. Volume managed with HD. She remains volume overloaded. Not candidate for CABG or PCI. Manage medically.  She continue to clot all of her lines. 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 She dropper her platelets with heparin several days ago and now is off heparin. 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 I am very concerned about DVT in RUE so will repeat u/s and we need to pull TLC as soon as we get another peripheral IV. Will stop milrinone and just use levophed. Heme consult pending re hypercoag state. Will start bivalarudin 6 hours after OR.  Overall prognosis quite guarded. Will need to be able to receive HD without inotropic support before we can form a firm disposition.  Shaune Pascal Bensimhon,MD 6:02 PM

## 2013-09-19 NOTE — Anesthesia Procedure Notes (Addendum)
Anesthesia Regional Block:  Popliteal block  Pre-Anesthetic Checklist: ,, timeout performed, Correct Patient, Correct Site, Correct Laterality, Correct Procedure, Correct Position, site marked, Risks and benefits discussed,  Surgical consent,  Pre-op evaluation,  At surgeon's request and post-op pain management  Laterality: Lower and Right  Prep: chloraprep       Needles:  Injection technique: Single-shot  Needle Type: Echogenic Needle          Additional Needles:  Procedures: nerve stimulator Popliteal block  Nerve Stimulator or Paresthesia:  Response: eversion, 0.3 mA,   Additional Responses:   Narrative:  Start time: 09/19/2013 11:31 AM End time: 09/19/2013 11:48 AM Injection made incrementally with aspirations every 5 mL.  Performed by: Personally  Anesthesiologist: Taeko Schaffer  Additional Notes: H+P and labs reviewed, risks and benefits discussed with patient, procedure tolerated well without complications

## 2013-09-19 NOTE — Progress Notes (Signed)
Pt transported to OR holding area. Report given at bedside to [place]. Pt comfortable and resting, no complaints. Safety was maintained.

## 2013-09-19 NOTE — Progress Notes (Signed)
Around 2015 this evening pt went into V-fib/PEA and was unresponsive, code blue called and CPR initiated at this time. Pt intubated at beside by CRNA. 1 amp of Epi, 1g of calicum and 1 amp of bicarb given. (Refer to CPR record for details). BMET, Troponin, lactic acid and CBC drawn by lab. 2026 pt in SVT HR 156, palpable pulses,pt responsive stat chest x-ray and 12 lead EKG done. 1 Amio bolus given and Fent. gtt started. Following the code pt has been stable and is currently ST 140's BP 11552 MAP 68. Dr.Bensimhon notified.

## 2013-09-19 NOTE — Anesthesia Postprocedure Evaluation (Signed)
  Anesthesia Post-op Note  Patient: Hannah [name]  Procedure(s) Performed: Procedure(s): Thrombectomy of Right Common Femoral Artery (Right) Right Femoral Endarterectomy (Right) Vein Patch Angioplasty of Right Femoral Artery (Right) Four Compartment Fasciotomy (Right)  Patient Location: SICU  Anesthesia Type:General  Level of Consciousness: awake, sedated and Patient remains intubated per anesthesia plan  Airway and Oxygen Therapy: Patient remains intubated per anesthesia plan and Patient placed on Ventilator (see vital sign flow sheet for setting)  Post-op Pain: mild  Post-op Assessment: Post-op Vital signs reviewed, Patient's Cardiovascular Status Stable, Respiratory Function Stable, Patent Airway and No signs of Nausea or vomiting  Post-op Vital Signs: stable  Last Vitals:  Filed Vitals:   09/19/13 1830  BP: 105/55  Pulse:   Temp:   Resp: 18    Complications: No apparent anesthesia complications

## 2013-09-19 NOTE — Progress Notes (Signed)
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  Developed RLE arterial clot in setting of IABP req R fem endarterectomy, thrombectomy and fasciotomy 09/17/13.  Background DM1  Subjective:  Extubated yesterday Remains on low dose NE, milrinone OOB to chair this AM No UOP   06/02 0701 - 06/03 0700 In: 1310.5 [I.V.:1140.5; NG/GT:120; IV Piggyback:50] Out: 300 [Emesis/NG output:300]  Filed Weights   09/17/13 0812 09/18/13 0500 09/19/13 0700  Weight: 118.7 kg (261 lb 11 oz) 126 kg (277 lb 12.5 oz) 129 kg (284 lb 6.3 oz)    Current meds: reviewed including aranesp 200 qWed Current Labs: reviewed    Physical Exam:  Blood pressure 106/65, pulse 120, temperature 97.5 F (36.4 C), temperature source Oral, resp. rate 16, height 5' 7.5" (1.715 m), weight 129 kg (284 lb 6.3 oz), last menstrual period 08/19/2013, SpO2 100.00%. Awake in chair Tachy, regular Coarse bs b/l Abd s/nt Some 1+ edema in legs Wound vacs at fasciotomy sites  Assessment 1. Dialysis dep't AoCKD5 ([facility] BL 4.82) 2. Acute Systolic HF on Milrione 3. R femoral clot s/p endarterectomy, fasciotomy, thrombectomy 09/17/13 4. Anemia on ESA qWed 200 Aranesp 5. VDRF 6. Carotid stenosis 7. Hx/o lung cancer and LLL lobectomy  8. DM1   Plan 1. Given successful extubation, stable K/HCO3, and ongoing small pressor dependence will hold on HD this AM esp if operative plans developing.  Next HD tentative for 09/20/13   Pearson Grippe MD 09/19/2013, 8:56 AM   Recent Labs Lab 09/18/13 0430 09/18/13 1546 09/19/13 0430  NA 131* 132* 133*  K 4.0 4.1 3.9  CL 94* 94* 95*  CO2 21 21 19   GLUCOSE 245* 223* 187*  BUN 20 25* 28*  CREATININE 3.90* 4.57* 5.20*  CALCIUM 7.6* 7.9* 7.5*  PHOS 4.9* 4.9* 5.5*    Recent Labs Lab 09/17/13 0500  09/17/13 1615 09/18/13 0430 09/19/13 0430  WBC 6.3  --   --  7.6 10.2  HGB  7.0*  < > 9.2* 8.7* 8.7*  HCT 22.5*  < > 27.0* 27.3* 26.9*  MCV 97.0  --   --  95.8 93.1  PLT 112*  --   --  148* 171  < > = values in this interval not displayed.

## 2013-09-19 NOTE — Consult Note (Signed)
PULMONARY / CRITICAL CARE MEDICINE   Name: Hannah [name] MRN: [id number] DOB: [birth year]    ADMISSION DATE:  09/03/2013 CONSULTATION DATE:  09/17/2013  REFERRING MD :  Irish Lack PRIMARY SERVICE: Cariology  CHIEF COMPLAINT:  S/p PEA arrest 6/3  BRIEF PATIENT DESCRIPTION: Hannah [name] is a 43 yo morbidly obese female with PMH of DM1, stage V kidney disease, blindness, h/o lung CA s/p LLL lobectomy in 2013, recently had a high risk stress test on 5/18 which showed ischemia in anterior and apical region and was subsequently admitted for further evaluation. She underwent cath 09/04/2013 which showed chronically occluded RCA and triple vessel dx. CT surgery consulted. EF 22% by Myoview. 30-35% by echo. Hospital course very complicated including significant volume overload requiring CRRT, development of cardiogenic shock requiring IABP placement.  After placement, she developed right foot ischemia requiring vascular surgery consult.  IABP was removed and pt was taken to OR on 6/1 for right femoral endarterectomy, right femoral thrombectomy, four compartment fasciotomy. She returned from the OR on the vent and PCCM was consulted for vent management ----------------------------------------------------------------------------------------------------------------------------- PCCM had signed off morning of 6/3.  Later that evening, pt was in her USOH until around 2030 when she complained of not feeling well. BP was low, levo increased.  Shortly thereafter, pt developed PEA.  Code Blue called, ACLS underway.  Received 1 round epi, calcium, bicarb.  Code lasted approx 10 minutes before ROSC.  Pt intubated by anesthesia, PCCM re-consulted.   SIGNIFICANT EVENTS / STUDIES:  2013 LLL lobectomy secondary to stage 1 NSCC. 5/18 carotid dopplers >>> right 60-79% IC stenosis, let 80-99%. 5/18 Admitted after high risk abnormal stress test (anterior and apical ischemia with EF 22%) 5/19 Cardiac cath - Severe 3 vessel  obstructive CAD, Elevated LV filling pressures 5/22 CRRT started 5/27 to OR for left IJ HD  permacath 5/28 RHC and IABP placement 5/29 loss of pulses in R foot, IABP d/c'd 5/30 LE arterial duplex >>> Right CFA appears occluded with no color Doppler signal or pulsed Doppler signal. 5/31 RUE venous duplex >>> no obvious DVT/SVT. 6/1 Right femoral endarterectomy, right femoral thrombectomy, four compartment fasciotomy - on vent post operatively 09/18/13 - extubated in SICU 09/19/13 PCCM signed off in AM. 09/19/13 - return to OR for closure of R leg fasciotomies. 09/19/13 PEA  arrest around 2030, re-intubated, PCCM reconsulted.  LINES / TUBES: R IJ TLC 5/21 >>> 5/21 R IJ HD TLC 5/21 >>> L IJ HD percath 5/27 >>> Right Luiz Blare 5/28 >>> 6/1? ET tube ? Date > extubate 09/18/13.  Reintubated 09/19/13 >>>  CULTURES: Blood 6/3 >>>  ANTIBIOTICS: Cefuroxime 6/1 >>> 6/2   SUBJECTIVE:   Extubated 6/2.  Returned to OR 6/3 for closure of right leg fasciotomies.  Was in her USOH until 2030 when suddenly developed PEA arrest.  ACLS initiated.  Epi x 1, calcium x 1, bicarb x 1.  Successful resuscitation after 10 minutes.  VITAL SIGNS: Temp:  [97.5 F (36.4 C)-98.5 F (36.9 C)] 98.3 F (36.8 C) (06/03 1958) Pulse Rate:  [39-125] 125 (06/03 1915) Resp:  [13-29] 16 (06/03 1915) BP: (83-145)/(46-78) 83/51 mmHg (06/03 1900) SpO2:  [94 %-100 %] 96 % (06/03 1915) Arterial Line BP: (55-100)/(43-94) 65/49 mmHg (06/03 0745) FiO2 (%):  [100 %] 100 % (06/03 2040) Weight:  [129 kg (284 lb 6.3 oz)] 129 kg (284 lb 6.3 oz) (06/03 0700)  HEMODYNAMICS:   VENTILATOR SETTINGS: Vent Mode:  [-] PRVC FiO2 (%):  [100 %] 100 % Set  Rate:  [20 bmp] 20 bmp Vt Set:  [500 mL] 500 mL PEEP:  [5 cmH20] 5 cmH20 Plateau Pressure:  [24 cmH20] 24 cmH20 INTAKE / OUTPUT: Intake/Output     06/03 0701 - 06/04 0700   I.V. (mL/kg) 631.2 (4.9)   NG/GT    IV Piggyback    Total Intake(mL/kg) 631.2 (4.9)   Emesis/NG output    Blood 10    Total Output 10   Net +621.2         PHYSICAL EXAMINATION: General: Obese female, s/p PEA arrest Neuro: Follows some commands. HEENT: [place]/AT. PERRL, sclerae anicteric. Cardiovascular: tachy but regular, no M/R/G.  Lungs: Respirations even and unlabored on vent.  CTA bilaterally, No W/R/R.  Abdomen: BS x 4, soft, NT/ND.  Musculoskeletal: RLE dressings in place, C/D/I. 1+ edema LLE. Skin: Intact, warm, no rashes.    LABS:  PULMONARY  Recent Labs Lab 09/13/13 1129  09/13/13 1451  09/17/13 0515 09/17/13 1339 09/17/13 1615 09/17/13 1857 09/18/13 0501  PHART  --   --  7.333*  --   --  7.389 7.392 7.354  --   PCO2ART  --   --  39.4  --   --  38.9 42.2 31.2*  --   PO2ART  --   --  74.0*  --   --  94.0 166.0* 107.0*  --   HCO3 22.8  --  21.0  --   --  23.5 25.6* 17.4*  --   TCO2 24  --  22  --   --  25 27 18   --   O2SAT 40.0  < > 94.0  < > 59.7 97.0 99.0 98.0 66.5  < > = values in this interval not displayed.  CBC  Recent Labs Lab 09/17/13 0500  09/17/13 1615 09/18/13 0430 09/19/13 0430  HGB 7.0*  < > 9.2* 8.7* 8.7*  HCT 22.5*  < > 27.0* 27.3* 26.9*  WBC 6.3  --   --  7.6 10.2  PLT 112*  --   --  148* 171  < > = values in this interval not displayed.  COAGULATION No results found for this basename: INR,  in the last 168 hours  CARDIAC  No results found for this basename: TROPONINI,  in the last 168 hours No results found for this basename: PROBNP,  in the last 168 hours   [place] Lab 09/15/13 0411  09/16/13 0447  09/17/13 0500  09/17/13 1730 09/18/13 0430 09/18/13 1546 09/19/13 0430 09/19/13 1408  NA  --   < > 137  < > 131*  < > 134* 131* 132* 133* 131*  K  --   < > 3.8  < > 4.1  < > 4.1 4.0 4.1 3.9 4.3  CL  --   < > 98  < > 92*  --  96 94* 94* 95* 92*  CO2  --   < > 27  < > 25  --  22 21 21 19 20   GLUCOSE  --   < > 253*  < > 270*  --  232* 245* 223* 187* 162*  BUN  --   < > 18  < > 30*  --  16 20 25* 28* 33*  CREATININE  --   < >  3.19*  < > 4.98*  --  3.15* 3.90* 4.57* 5.20* 5.92*  CALCIUM  --   < > 7.4*  < > 7.5*  --  7.6* 7.6* 7.9* 7.5*  7.9*  MG 2.1  --  1.9  --  2.0  --   --  1.8  --  1.8  --   PHOS  --   < > 3.4  < > 4.5  --  3.9 4.9* 4.9* 5.5* 6.9*  < > = values in this interval not displayed. Estimated Creatinine Clearance: 16.9 ml/min (by C-G formula based on Cr of 5.92).   LIVER  Recent Labs Lab 09/13/13 0428  09/17/13 0500 09/17/13 1730 09/18/13 1546 09/19/13 0430 09/19/13 1408  AST 12  --  10  --   --   --   --   ALT 14  --  6  --   --   --   --   ALKPHOS 66  --  66  --   --   --   --   BILITOT 0.2*  --  0.3  --   --   --   --   PROT 5.5*  --  4.8*  --   --   --   --   ALBUMIN 2.3*  < > 1.8* 2.3* 1.9* 1.8* 1.7*  < > = values in this interval not displayed.   INFECTIOUS No results found for this basename: LATICACIDVEN, PROCALCITON,  in the last 168 hours   ENDOCRINE CBG (last 3)   Recent Labs  09/19/13 0805 09/19/13 1400 09/19/13 1707  GLUCAP 161* 131* 144*         IMAGING x48h  Dg Chest Port 1 View  09/18/2013   CLINICAL DATA:  Check endotracheal tube following readjustment  EXAM: PORTABLE CHEST - 1 VIEW  COMPARISON:  09/17/2013  FINDINGS: Cardiac shadow is stable. An endotracheal tube and nasogastric catheter are again seen. The endotracheal tube now lie is 3.5 cm above the carina. Patchy infiltrative changes are again seen predominately within the right lower lobe but stable from the prior exam. The left dialysis catheter is again identified and stable. A right-sided venous sheath is again seen and stable.  IMPRESSION: Interval enhancement of the endotracheal tube as described above.  The remainder of the exam is stable from the prior study.   Electronically Signed   By: Inez Catalina M.D.   On: 09/18/2013 11:05      ASSESSMENT / PLAN:  PULMONARY A: Acute Respiratory Failure s/p PEA arrest Acute Pulmonary Edema S/p LLL lobectomy secondary to [place] Former smoker Severe  COPD - Gold IV (PFT's 5/20: FEV1 28% pre BD, 36% post BD) P:   - Full vent support. - STAT ABG. - Despite edema, would hold diuresis given labile BP. - SBT in AM if able. - CXR / ABG in AM.  CARDIOVASCULAR A:  S/p PEA Arrest 6/3 Cardiogenic Shock - now requiring levo and milrinone. Acute on chronic systolic heart failure Multivessel CAD- CVTS consulted - not a candidate for CABG due to poor lung function & shock Acute pulmonary edema/ volume overload P:  - STAT EKG. - Cycle troponins. - Cards notified - Dr. Haroldine Laws is on his way in. - Levophed for MAP > 65.  VASCULAR A: Right foot ischemia s/p  Right femoral endarterectomy, right femoral thrombectomy, four compartment fasciotomy Carotid stenosis - doppler 08/31/2013 shows R 60-79% ICA stenosis, L 80-99% ICA stenosis P: - Vascular following RLE , guarded prognosis for RLE  RENAL A:  ESRD - on CVVHD lt permacath P:   - Nephrology following. - STAT BMP. - BMP in AM.  GASTROINTESTINAL A:   Obesity P:   -  NPO. - Pantoprazole.  HEMATOLOGIC A:   Anemia P:  - Goal Hgb > 8 given cardiac hx. - SCD's. - CBC in AM. - Manage per primary.  INFECTIOUS A:  No acute issues. P:   - monitor.   ENDOCRINE A:   DM P:   - SSI.  NEUROLOGIC A:   Acute Encephalopathy. P:   -  Fentanyl for sedation. - Avoid morphine, oxycodone in ICU setting with renal failure  GLOBAL:  Pt was doing well s/p extubation 6/2.   Unfortunately had PEA arrest evening of 6/3.  Re-intubated by anesthesia.  ACLS performed x 10 minutes with epi x 1, calcium x 1, bicarb x 1.  Monitor shows wide complex tachycardia at 150, BP 110/68 though levophed up to 6mcg.  Will obtain STAT EKG, ABG, BMP, and cycle troponins.  In addition, I have ordered a bolus of Amiodarone (pt already on maintenance Amio).  Case discussed with Dr. Alva Garnet who is in agreement with plan.  Husband updated at bedside.  Consider goals of care discussion in AM.  Additional CC  time: 45 min.  Montey Hora, [facility] Pgr: [phone]  or [phone]    Attending:  I have seen and examined the patient with nurse practitioner/resident and agree with the note above.   Complicated case.  It is not clear to me what caused tonight's episode. PE? Cardiac related with cardiomyopathy? No clear ischemic changes on EKG.  No clear acute metabolic derangement on labs to explain.  Cannot perform CT angio given renal issues.    Currently resting comfortably, tachycardic, and on milrinone and levophed as prior to the event this evening (albiet levo dose higher).  Overnight plan: -maintain on vent, fentanyl gtt for comfort -continue milrinone, amiodarone, levophed -consider repeat Echo -f/u with cards, given clotting propensity she doesn't seem like a good candidate for mechanical cardiac support -palliative care?  Family updated at bedside by me  CC time 45 minutes.  Roselie Awkward, MD [facility] Pager: [phone] Cell: [phone] If no response, call [phone]

## 2013-09-20 ENCOUNTER — Inpatient Hospital Stay (HOSPITAL_COMMUNITY): Payer: Medicare Other

## 2013-09-20 DIAGNOSIS — N185 Chronic kidney disease, stage 5: Secondary | ICD-10-CM

## 2013-09-20 DIAGNOSIS — R9430 Abnormal result of cardiovascular function study, unspecified: Secondary | ICD-10-CM

## 2013-09-20 DIAGNOSIS — R609 Edema, unspecified: Secondary | ICD-10-CM

## 2013-09-20 DIAGNOSIS — I82A11 Acute embolism and thrombosis of right axillary vein: Secondary | ICD-10-CM

## 2013-09-20 LAB — CARBOXYHEMOGLOBIN
Carboxyhemoglobin: 1.3 % (ref 0.5–1.5)
Methemoglobin: 0.7 % (ref 0.0–1.5)
O2 Saturation: 63.6 %
Total hemoglobin: 8.9 g/dL — ABNORMAL LOW (ref 12.0–16.0)

## 2013-09-20 LAB — RENAL FUNCTION PANEL
ALBUMIN: 1.9 g/dL — AB (ref 3.5–5.2)
Albumin: 1.6 g/dL — ABNORMAL LOW (ref 3.5–5.2)
BUN: 38 mg/dL — AB (ref 6–23)
BUN: 44 mg/dL — ABNORMAL HIGH (ref 6–23)
CHLORIDE: 89 meq/L — AB (ref 96–112)
CHLORIDE: 86 meq/L — AB (ref 96–112)
CO2: 16 mEq/L — ABNORMAL LOW (ref 19–32)
CO2: 19 meq/L (ref 19–32)
Calcium: 8.6 mg/dL (ref 8.4–10.5)
Calcium: 8 mg/dL — ABNORMAL LOW (ref 8.4–10.5)
Creatinine, Ser: 6.51 mg/dL — ABNORMAL HIGH (ref 0.50–1.10)
Creatinine, Ser: 7.26 mg/dL — ABNORMAL HIGH (ref 0.50–1.10)
GFR calc non Af Amer: 6 mL/min — ABNORMAL LOW (ref >90)
GFR, EST AFRICAN AMERICAN: 8 mL/min — AB (ref >90)
GFR, EST AFRICAN AMERICAN: 7 mL/min — AB (ref >90)
GFR, EST NON AFRICAN AMERICAN: 7 mL/min — AB (ref >90)
Glucose, Bld: 412 mg/dL — ABNORMAL HIGH (ref 70–99)
Glucose, Bld: 380 mg/dL — ABNORMAL HIGH (ref 70–99)
POTASSIUM: 5 meq/L (ref 3.7–5.3)
Phosphorus: 8.9 mg/dL — ABNORMAL HIGH (ref 2.3–4.6)
Phosphorus: 8.2 mg/dL — ABNORMAL HIGH (ref 2.3–4.6)
Potassium: 5.5 mEq/L — ABNORMAL HIGH (ref 3.7–5.3)
SODIUM: 128 meq/L — AB (ref 137–147)
Sodium: 126 mEq/L — ABNORMAL LOW (ref 137–147)

## 2013-09-20 LAB — CBC
HCT: 30.3 % — ABNORMAL LOW (ref 36.0–46.0)
Hemoglobin: 9.9 g/dL — ABNORMAL LOW (ref 12.0–15.0)
MCH: 30.7 pg (ref 26.0–34.0)
MCHC: 32.7 g/dL (ref 30.0–36.0)
MCV: 94.1 fL (ref 78.0–100.0)
PLATELETS: 373 10*3/uL (ref 150–400)
RBC: 3.22 MIL/uL — ABNORMAL LOW (ref 3.87–5.11)
RDW: 17.1 % — ABNORMAL HIGH (ref 11.5–15.5)
WBC: 16.4 10*3/uL — ABNORMAL HIGH (ref 4.0–10.5)

## 2013-09-20 LAB — GLUCOSE, CAPILLARY
GLUCOSE-CAPILLARY: 350 mg/dL — AB (ref 70–99)
GLUCOSE-CAPILLARY: 413 mg/dL — AB (ref 70–99)
GLUCOSE-CAPILLARY: 426 mg/dL — AB (ref 70–99)
Glucose-Capillary: 282 mg/dL — ABNORMAL HIGH (ref 70–99)
Glucose-Capillary: 366 mg/dL — ABNORMAL HIGH (ref 70–99)

## 2013-09-20 LAB — BASIC METABOLIC PANEL
BUN: 41 mg/dL — AB (ref 6–23)
CHLORIDE: 85 meq/L — AB (ref 96–112)
CO2: 16 mEq/L — ABNORMAL LOW (ref 19–32)
CREATININE: 6.78 mg/dL — AB (ref 0.50–1.10)
Calcium: 8.3 mg/dL — ABNORMAL LOW (ref 8.4–10.5)
GFR, EST AFRICAN AMERICAN: 8 mL/min — AB (ref >90)
GFR, EST NON AFRICAN AMERICAN: 7 mL/min — AB (ref >90)
Glucose, Bld: 475 mg/dL — ABNORMAL HIGH (ref 70–99)
Potassium: 5.6 mEq/L — ABNORMAL HIGH (ref 3.7–5.3)
Sodium: 127 mEq/L — ABNORMAL LOW (ref 137–147)

## 2013-09-20 LAB — APTT
APTT: 52 s — AB (ref 24–37)
aPTT: 93 seconds — ABNORMAL HIGH (ref 24–37)
aPTT: 106 seconds — ABNORMAL HIGH (ref 24–37)
aPTT: 85 seconds — ABNORMAL HIGH (ref 24–37)

## 2013-09-20 LAB — HEPARIN INDUCED THROMBOCYTOPENIA PNL
HEPARIN INDUCED PLT AB: POSITIVE
Patient O.D.: 1.222
UFH High Dose UFH H: 0 % Release
UFH Low Dose 0.1 IU/mL: 0 % Release
UFH Low Dose 0.5 IU/mL: 0 % Release
UFH SRA Result: NEGATIVE

## 2013-09-20 LAB — MAGNESIUM
Magnesium: 2 mg/dL (ref 1.5–2.5)
Magnesium: 2 mg/dL (ref 1.5–2.5)

## 2013-09-20 LAB — PHOSPHORUS: Phosphorus: 8.9 mg/dL — ABNORMAL HIGH (ref 2.3–4.6)

## 2013-09-20 MED ORDER — SODIUM CHLORIDE 0.9 % FOR CRRT
INTRAVENOUS_CENTRAL | Status: DC | PRN
  Administered 2013-09-20: 2000 mL via INTRAVENOUS_CENTRAL
  Filled 2013-09-20: qty 1000

## 2013-09-20 MED ORDER — ALTEPLASE 2 MG IJ SOLR
2.0000 mg | Freq: Once | INTRAMUSCULAR | Status: AC
  Administered 2013-09-20: 2 mg
  Filled 2013-09-20: qty 2

## 2013-09-20 MED ORDER — PRISMASOL BGK 4/2.5 32-4-2.5 MEQ/L IV SOLN
INTRAVENOUS | Status: DC
  Administered 2013-09-20 – 2013-10-14 (×43): via INTRAVENOUS_CENTRAL
  Filled 2013-09-20 (×55): qty 5000

## 2013-09-20 MED ORDER — SODIUM CHLORIDE 0.9 % IV SOLN
INTRAVENOUS | Status: DC
  Administered 2013-09-20: 11.3 [IU]/h via INTRAVENOUS
  Administered 2013-09-20: 3.7 [IU]/h via INTRAVENOUS
  Filled 2013-09-20 (×3): qty 1

## 2013-09-20 MED ORDER — BIOTENE DRY MOUTH MT LIQD
15.0000 mL | Freq: Two times a day (BID) | OROMUCOSAL | Status: DC
  Administered 2013-09-20 – 2013-09-22 (×6): 15 mL via OROMUCOSAL

## 2013-09-20 MED ORDER — PRISMASOL BGK 4/2.5 32-4-2.5 MEQ/L IV SOLN
INTRAVENOUS | Status: DC
  Administered 2013-09-20 – 2013-10-14 (×143): via INTRAVENOUS_CENTRAL
  Filled 2013-09-20 (×193): qty 5000

## 2013-09-20 MED ORDER — PRISMASOL BGK 4/2.5 32-4-2.5 MEQ/L IV SOLN
INTRAVENOUS | Status: DC
  Administered 2013-09-20 – 2013-10-14 (×23): via INTRAVENOUS_CENTRAL
  Filled 2013-09-20 (×26): qty 5000

## 2013-09-20 MED ORDER — CHLORHEXIDINE GLUCONATE 0.12 % MT SOLN: 15.0000 mL | Freq: Two times a day (BID) | OROMUCOSAL | Status: DC

## 2013-09-20 MED ORDER — INSULIN ASPART 100 UNIT/ML ~~LOC~~ SOLN: 1.0000 [IU] | SUBCUTANEOUS | Status: DC

## 2013-09-20 MED FILL — Medication: Qty: 1 | Status: AC

## 2013-09-20 NOTE — Progress Notes (Signed)
ADVANCED HEART FAILURE ROUNDING NOTE   SUBJECTIVE  43 yo morbidly obese female with PMH of DM1, stage V kidney disease, blindness, h/o lung CA s/p resection who was recently discharged for heart failure symptom had a high risk stress test which showed ischemia in anterior and apical region. She underwent cath 09/04/2013 which showed chronically occluded RCA and triple vessel dx. CT surgery consulted but not felt to be surgical candidate due to poor PFTs, suboptimal targets, comorbidities and shock. EF 22% by Myoview. 30-35% by echo  Underwent placement of swan and IABP last week for shock (co-ox 38%) last week. Unfortunately developed cold foot and IABP had to be removed.Underwent R femoral thrombectomy and 4 compartment fasciotomy of RLE on 6/1. Just got out of OR after fasciotomy closure.   Last night had VT/VF->PEA arrest 10 mins CPR. This am awake on ventilator on high dose pressors. Perm cath appears clotted. R subclavian sheath also won't aspirate. U/s shows R axillary DVT    CURRENT MEDS . amiodarone  200 mg Oral BID  . antiseptic oral rinse  15 mL Mouth Rinse BID  . antiseptic oral rinse  15 mL Mouth Rinse q12n4p  . aspirin  81 mg Oral Daily  . atorvastatin  80 mg Oral q1800  . calcitRIOL  0.25 mcg Oral Daily  . darbepoetin (ARANESP) injection - NON-DIALYSIS  200 mcg Subcutaneous Q Wed-1800  . lidocaine (PF)  5 mL Other Once  . metoprolol  2.5 mg Intravenous Once  . pantoprazole sodium  40 mg Per Tube Daily  . sodium chloride  10-40 mL Intracatheter Q12H  . sodium chloride  3 mL Intravenous Q12H  . sodium chloride  3 mL Intravenous Q12H    OBJECTIVE  Filed Vitals:   09/20/13 1830 09/20/13 1845 09/20/13 1900 09/20/13 1915  BP: 98/51 110/50 108/78 110/50  Pulse: 116 118 118 117  Temp:      TempSrc:      Resp: 17 19 18 15   Height:      Weight:      SpO2: 99% 100% 99% 99%    Intake/Output Summary (Last 24 hours) at 09/20/13 1927 Last data filed at 09/20/13 1900  Gross  per 24 hour  Intake 1961.32 ml  Output    290 ml  Net 1671.32 ml   Filed Weights   09/18/13 0500 09/19/13 0700 09/20/13 0500  Weight: 126 kg (277 lb 12.5 oz) 129 kg (284 lb 6.3 oz) 131 kg (288 lb 12.8 oz)    PHYSICAL EXAM  General:Lying flat in bed. intuated Neuro: Alert and oriented X 3. Moves all extremities spontaneous HEENT:  Normal  Neck: Supple without bruits. Chest L subclav PC. R subclav sheat. Unable to see JVD Lungs:  clear Heart: tachy regular. +s3 Abdomen: Soft, non-tender, non-distended, BS + x 4.  Extremities: 3+ edema. R foot somewhat mottled with palpable DP pulse.L foot also a bit dusky. R arm swelling   CBC  Recent Labs  09/19/13 0430 09/20/13 0126  WBC 10.2 16.4*  HGB 8.7* 9.9*  HCT 26.9* 30.3*  MCV 93.1 94.1  PLT 171 443   Basic Metabolic Panel  Recent Labs  09/20/13 0126 09/20/13 0815 09/20/13 1343 09/20/13 1558  NA 128* 127*  --  126*  K 5.5* 5.6*  --  5.0  CL 89* 85*  --  86*  CO2 16* 16*  --  19  GLUCOSE 412* 475*  --  380*  BUN 38* 41*  --  44*  CREATININE  6.51* 6.78*  --  7.26*  CALCIUM 8.6 8.3*  --  8.0*  MG 2.0  --  2.0  --   PHOS 8.9*  8.9*  --   --  8.2*   Liver Function Tests  Recent Labs  09/20/13 0126 09/20/13 1558  ALBUMIN 1.9* 1.6*   Cardiac Enzymes  Recent Labs  09/19/13 2032  TROPONINI <0.30   Thyroid Function Tests No results found for this basename: TSH, T4TOTAL, FREET3, T3FREE, THYROIDAB,  in the last 72 hours  TELE  Sinus tachycardia 110-120s   Radiology/Studies  Dg Chest 2 View  09/06/2013   CLINICAL DATA:  CHF, intermittent dyspnea  EXAM: CHEST  2 VIEW  COMPARISON:  Prior chest x-ray 08/13/2013; prior chest CT 08/22/2013  FINDINGS: Stable cardiac and mediastinal contours. Atherosclerotic calcifications are present within the transverse aorta. Moderate left and small right pleural effusions are similar compared to prior. There is persistent associated bibasilar atelectasis. Mild pulmonary vascular  congestion without overt edema. No pneumothorax. No new focal airspace consolidation. No acute osseous abnormality.  IMPRESSION: 1. Stable moderate left and small right pleural effusions and associated bibasilar atelectasis. 2. Pulmonary vascular congestion without overt edema.   Electronically Signed   By: Jacqulynn Cadet M.D.   On: 09/06/2013 07:55   Ct Chest Wo Contrast  08/22/2013   CLINICAL DATA:  Left lung cancer status post lobectomy. Weight gain. Renal insufficiency.  EXAM: CT CHEST WITHOUT CONTRAST  TECHNIQUE: Multidetector CT imaging of the chest was performed following the standard protocol without IV contrast.  COMPARISON:  Radiographs 08/13/2013 and 08/04/2013.  CT 08/02/2012.  FINDINGS: There are stable postsurgical changes related to prior left lower lobe resection. There has been interval enlargement of several mediastinal lymph nodes. These include 12 mm right paratracheal (image 19), 13 mm precarinal (image 23), and 14 mm AP window (image 25) lymph nodes. Some of these nodes have retained fatty hila. Allowing for the limitations of noncontrast technique, the hila appear stable.  There is stable low-density within the right thyroid lobe. Atherosclerosis of the aorta, great vessels and coronary arteries is noted. The heart size is normal. There is no pericardial effusion.  Moderate size dependent pleural effusions are present bilaterally. There is associated dependent airspace disease in both lower lobes. In addition, there are more focal airspace opacities within the right lower lobe and inferior aspect of the left upper lobe. The latter is somewhat nodular, measuring up to 1.8 cm on image 43. No other focal nodularity or endobronchial lesions are demonstrated.  The visualized upper abdomen has a stable appearance. There is no adrenal mass. There is increased subcutaneous edema throughout the subcutaneous fat, especially within the anterior aspect of the upper abdomen.  No worrisome osseous  findings are demonstrated.  IMPRESSION: 1. As demonstrated radiographically, there are bilateral pleural effusions and bilateral airspace opacities which are new compared with the prior CT. Although there are focal somewhat nodular components in the right lower and left upper lobes, these findings are most likely infectious/inflammatory. 2. Progressive mediastinal lymphadenopathy. Some of the nodes have retained fatty hila and may be reactive. Metastatic disease cannot be completely excluded. 3. Stable atherosclerosis and low-density within the right thyroid lobe. 4. If the airspace opacities and pleural effusions fail to respond to appropriate clinical therapy, follow-up CT or PET-CT may be warranted to exclude metastatic disease.   Electronically Signed   By: Camie Patience M.D.   On: 08/22/2013 10:26   Dg Chest Portable 1 View  08/13/2013  CLINICAL DATA:  Shortness of breath increasing for 2 weeks. History of CHF.  EXAM: PORTABLE CHEST - 1 VIEW  COMPARISON:  08/04/2013  FINDINGS: Again noted are bibasilar lung densities that are suggestive for pleural effusions and atelectasis. Heart size appears to be enlarged. There is some peribronchial thickening and cannot exclude pulmonary edema.  IMPRESSION: Bilateral pleural effusions with basilar atelectasis. There is mild pulmonary edema. Minimal change from the previous examination.   Electronically Signed   By: Markus Daft M.D.   On: 08/13/2013 23:10    ASSESSMENT AND PLAN 1. Cardiogenic shock 2. Acute on chronic systolic HF     EF ~65-79% 3. 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 Acute respiratory failure 5. 3-V CAD 6. Carotid stenosis     - doppler 08/31/2013 shows R 60-79% ICA stenosis, L 80-99% ICA stenosis     - vascular surgery aware 7. 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 DM1 9. Ischemic R foot s/p R femoral thrombectomy and 4-compartment fasciotomy.  10.  V/VF arrest 6/3 11. R axillary DVT   As per my note from earlier today, she is critically ill and continues to deteriorate. She is s/p cardiac arrest last night and now is dependent on high-dose pressors with frequent VT on her monitor. This is c/b ESRD and what seems to be diffuse diffuse clotting d/o with acute DVT, critical limb ischmia and possible PE (trigger of cardiac arrest?). I think the chances of a meaningful survival are close to zero. I explained this to her and her family and also felt the chances for suffering were great. However, she currently wants to proceed with aggressive care. We will go along with her wishes. Continue levophed/milrinone/amiodarone. Argatroban for anticoagulation. We will TPA permcath and exchange R subclavian sheath for TLC as we have nearly run out of access sites.   The patient is critically ill with multiple organ systems failure and requires high complexity decision making for assessment and support, frequent evaluation and titration of therapies, application of advanced monitoring technologies and extensive interpretation of multiple databases.   Critical Care Time devoted to patient care services described in this note is 90 Minutes.   Shaune Pascal Brinkley Peet,MD 7:27 PM

## 2013-09-20 NOTE — Progress Notes (Signed)
ANTICOAGULATION CONSULT NOTE - Follow Up Consult  Pharmacy Consult:  Argatroban Indication:  Thromboembolic state  Allergies  Allergen Reactions  . Crestor [Rosuvastatin] Other (See Comments)    Severe muscle weakness  . Nsaids Other (See Comments)    Not allergic, "bad on my kidneys"  . Ciprofloxacin Rash    Patient Measurements: Height: 5' 7.5" (171.5 cm) Weight: 288 lb 12.8 oz (131 kg) IBW/kg (Calculated) : 62.75  Vital Signs: Temp: 98 F (36.7 C) (06/04 1600) Temp src: Oral (06/04 1600) BP: 103/77 mmHg (06/04 1815) Pulse Rate: 114 (06/04 1815)  Labs:  Recent Labs  09/18/13 0430  09/19/13 0430  09/19/13 2032  09/20/13 0126 09/20/13 0815 09/20/13 1558 09/20/13 1650  HGB 8.7*  --  8.7*  --   --   --  9.9*  --   --   --   HCT 27.3*  --  26.9*  --   --   --  30.3*  --   --   --   PLT 148*  --  171  --   --   --  373  --   --   --   APTT 37  --  21*  --   --   < > 52* 93*  --  106*  CREATININE 3.90*  < > 5.20*  < > 6.05*  --  6.51* 6.78* 7.26*  --   TROPONINI  --   --   --   --  <0.30  --   --   --   --   --   < > = values in this interval not displayed.  Estimated Creatinine Clearance: 13.9 ml/min (by C-G formula based on Cr of 7.26).   Medical History: Past Medical History  Diagnosis Date  . High cholesterol   . Diabetic retinopathy   . Peripheral neuropathy     "tips of toes"  . Blind right eye   . CHF (congestive heart failure)   . CAD (coronary artery disease)   . GERD (gastroesophageal reflux disease)   . Hypertension   . History of lung cancer 07/2011    s/p left lower lobectomy  . Diabetes mellitus     IDDM  . Cataract of right eye   . Ulcer of toe of right foot 07/10/2012    great toe  . Breast calcification, left 06/2012  . Acute biphenotypic leukemia   . CKD (chronic kidney disease) stage 5, GFR less than 15 ml/min   . Nephrotic syndrome   . Gastritis     H/o gastritis on prior endoscopy  . Anemia   . Carotid artery disease     Assessment: 59 YOF with right foot ischemia s/p femoral endarterectomy and thrombectomy on 09/17/13.  S/p fasciotomy closure on 09/19/13.   PLTC dropped on systemic heparin but SRA for HIT negative.   Argatroban started for anticoagulation, PTT trending up with two consecutive rate decreases.  PTT 109, per RN no bleeding noted.  Argatroban infusing at 0.5 mcg/kg/min using wt of 129kg  Goal of Therapy:  aPTT 50 - 90 seconds Monitor platelets by anticoagulation protocol: Yes   Plan Decrease Argatroban to 0.53mcg/kg/min Draw aPTT in 2h   Thank you for allowing pharmacy to be a part of this patients care team.  Rowe Robert Pharm.D., BCPS, AQ-Cardiology Clinical Pharmacist 09/20/2013 6:23 PM Pager: [phone] Phone: [phone]

## 2013-09-20 NOTE — Progress Notes (Signed)
Earlier today when Levophed bag was being changed, the pump was checked to ensure that the correct strength levophed was in the pump. It was found that the pump was set for single strength levo while the bag was quad strength levo. Primary physician was notified and orders to titrate the levo down for pt SBP > 90. Levo has since been titrated down per verbal order and bp is improving while requiring less and less levophed. CRRT initiated without complications. Pt currently resting comfortably with BP 112/43 and HR down to 110-120's, less ectopy and on 4L [place].

## 2013-09-20 NOTE — Progress Notes (Signed)
RN paged me   - Patient d/w family: patient wants full code.   - RN has informed cards primary   Dr. Brand Males, M.D., [facility].C.P Pulmonary and Critical Care Medicine Staff Physician [place] Pager: [phone], If no answer or between  15:00h - 7:00h: call [phone]  09/20/2013 10:28 AM

## 2013-09-20 NOTE — Progress Notes (Addendum)
  [facility] Progress Note  09/20/2013 8:28 AM 3 Days Post-Op  Subjective: Patient went into PEA arrest last evening and code blue was called. ACLS proceeded, code lasted approximately 10 minutes before ROSC. Per RN, her right foot is looking worse today and pt is unable to move her toes. RN able to find doppler signals in both legs bilaterally. She is on the ventilator and is alert and responsive. Denies any pain in her legs. She follows commands.   Tmax 99.5 BP sys 60s-120s 02 100% Ventilator   Filed Vitals:   09/20/13 0741  BP:   Pulse:   Temp: 98.4 F (36.9 C)  Resp:     Physical Exam: Incisions: Right groin incisions, clean dry and intact. Fasciotomy closure staple lines c/d/i. No surrounding erythema or drainage.  Extremities:  Doppler signals DP bilaterally. Feet cool to touch. No ulcerations seens.  Neuro: No sensation to light touch on plantar surface of left foot. Sensation of right medial aspect of foot by medial malleolus.  Full motor function of left extremity. Able to lift right leg but unable to move right ankle or toes.   CBC    Component Value Date/Time   WBC 16.4* 09/20/2013 0126   RBC 3.22* 09/20/2013 0126   HGB 9.9* 09/20/2013 0126   HCT 30.3* 09/20/2013 0126   PLT 373 09/20/2013 0126   MCV 94.1 09/20/2013 0126   MCH 30.7 09/20/2013 0126   MCHC 32.7 09/20/2013 0126   RDW 17.1* 09/20/2013 0126   LYMPHSABS 1.4 08/04/2013 1223   MONOABS 0.4 08/04/2013 1223   EOSABS 0.2 08/04/2013 1223   BASOSABS 0.1 08/04/2013 1223    BMET    Component Value Date/Time   NA 128* 09/20/2013 0126   K 5.5* 09/20/2013 0126   CL 89* 09/20/2013 0126   CO2 16* 09/20/2013 0126   GLUCOSE 412* 09/20/2013 0126   BUN 38* 09/20/2013 0126   CREATININE 6.51* 09/20/2013 0126   CREATININE 4.17* 07/06/2013 1618   CALCIUM 8.6 09/20/2013 0126   GFRNONAA 7* 09/20/2013 0126   GFRNONAA 71 07/13/2011 0835   GFRAA 8* 09/20/2013 0126   GFRAA 82 07/13/2011 0835    INR    Component Value Date/Time   INR  1.13 09/06/2013 0740     Intake/Output Summary (Last 24 hours) at 09/20/13 0828 Last data filed at 09/20/13 0500  Gross per 24 hour  Intake 1464.05 ml  Output     10 ml  Net 1454.05 ml     Assessment:  43 y.o. female is s/p:  Right femoral endarterectomy, right femoral thrombectomy, four compartment fasciotomy 3 Days Post-Op  Closure of right leg fasciotomies 1 Day Post-Op   Plan: -From vascular standpoint, both legs look viable. Doppler signals bilaterally. She does have motor and neurological deficits of her right extremity that are likely due to prolonged ischemia. Will continue to monitor.  -Currently on argatroban.     Virgina Jock, PA-C [facility] Office: [phone] Pager: [phone] 09/20/2013 8:28 AM     Agree with above.   Events of last night noted. Has perfusion to both extremities.  No further vascular intervention at this point in light of other medical issues.  Will continue to follow  Ruta Hinds, MD [facility] [place] Office: [phone] Pager: [phone]

## 2013-09-20 NOTE — Progress Notes (Signed)
ANTICOAGULATION CONSULT NOTE - Follow Up Consult  Pharmacy Consult:  Argatroban Indication:  Thromboembolic state  Allergies  Allergen Reactions  . Crestor [Rosuvastatin] Other (See Comments)    Severe muscle weakness  . Nsaids Other (See Comments)    Not allergic, "bad on my kidneys"  . Ciprofloxacin Rash    Patient Measurements: Height: 5' 7.5" (171.5 cm) Weight: 288 lb 12.8 oz (131 kg) IBW/kg (Calculated) : 62.75  Vital Signs: Temp: 97.7 F (36.5 C) (06/04 1954) Temp src: Oral (06/04 1954) BP: 116/54 mmHg (06/04 2115) Pulse Rate: 117 (06/04 2115)  Labs:  Recent Labs  09/18/13 0430  09/19/13 0430  09/19/13 2032  09/20/13 0126 09/20/13 0815 09/20/13 1558 09/20/13 1650 09/20/13 2030  HGB 8.7*  --  8.7*  --   --   --  9.9*  --   --   --   --   HCT 27.3*  --  26.9*  --   --   --  30.3*  --   --   --   --   PLT 148*  --  171  --   --   --  373  --   --   --   --   APTT 37  --  21*  --   --   < > 52* 93*  --  106* 85*  CREATININE 3.90*  < > 5.20*  < > 6.05*  --  6.51* 6.78* 7.26*  --   --   TROPONINI  --   --   --   --  <0.30  --   --   --   --   --   --   < > = values in this interval not displayed.  Estimated Creatinine Clearance: 13.9 ml/min (by C-G formula based on Cr of 7.26).   Medical History: Past Medical History  Diagnosis Date  . High cholesterol   . Diabetic retinopathy   . Peripheral neuropathy     "tips of toes"  . Blind right eye   . CHF (congestive heart failure)   . CAD (coronary artery disease)   . GERD (gastroesophageal reflux disease)   . Hypertension   . History of lung cancer 07/2011    s/p left lower lobectomy  . Diabetes mellitus     IDDM  . Cataract of right eye   . Ulcer of toe of right foot 07/10/2012    great toe  . Breast calcification, left 06/2012  . Acute biphenotypic leukemia   . CKD (chronic kidney disease) stage 5, GFR less than 15 ml/min   . Nephrotic syndrome   . Gastritis     H/o gastritis on prior endoscopy  .  Anemia   . Carotid artery disease    Assessment: 36 YOF with right foot ischemia s/p femoral endarterectomy and thrombectomy on 09/17/13.  S/p fasciotomy closure on 09/19/13.   PLTC dropped on systemic heparin but SRA for HIT negative.   Argatroban started for anticoagulation, PTT now down on 0.4 mcg/kg/min (using wt of 129 kg), per RN no bleeding noted.    Goal of Therapy:  aPTT 50 - 90 seconds Monitor platelets by anticoagulation protocol: Yes   Plan Continue Argatroban to 0.59mcg/kg/min Draw aPTT in 2h  Thank you for allowing pharmacy to be a part of this patients care team.  Rowe Robert Pharm.D., BCPS, AQ-Cardiology Clinical Pharmacist 09/20/2013 9:26 PM Pager: [phone] Phone: [phone]

## 2013-09-20 NOTE — Progress Notes (Signed)
High sugars  Start icu hyperglycemia protocol   Dr. Brand Males, M.D., [facility].C.P Pulmonary and Critical Care Medicine Staff Physician [place] [facility] Pager: [phone], If no answer or between  15:00h - 7:00h: call [phone]  09/20/2013 11:53 AM

## 2013-09-20 NOTE — Procedures (Signed)
Central Venous Catheter Insertion Procedure Note Hannah [name] [id number] Aug 20, 1970  Procedure: Insertion of Central Venous Catheter Indications: Drug and/or fluid administration  Procedure Details Consent: Risks of procedure as well as the alternatives and risks of each were explained to the (patient/caregiver).  Consent for procedure obtained. Time Out: Verified patient identification, verified procedure, site/side was marked, verified correct patient position, special equipment/implants available, medications/allergies/relevent history reviewed, required imaging and test results available.  Performed  Maximum sterile technique was used including antiseptics, cap, gloves, gown, hand hygiene, mask and sheet. The right chest wall and pre-existing sheath in the R subclavian vein were copiously cleaned with chlorhexidine solution. The existing R subclavian sheath was then removed over a wire and an antimicrobial bonded/coated triple lumen catheter was placed in the right subclavian vein using the Seldinger technique.  Evaluation Blood flow good Complications: No apparent complications Patient did tolerate procedure well. Chest X-ray ordered to verify placement.  CXR: normal. Post placement the patient had significant serous oozing at sight and manual pressure was held for 10 minutes.  Shaune Pascal Bensimhon MD 09/20/2013, 7:41 PM

## 2013-09-20 NOTE — Progress Notes (Addendum)
Admit: 09/03/2013 LOS: 8  89H acute systolic HF 2/2 ischemia (not current CABG candidate), milrinone dependent, and AoCKD5 (BL 4.8) now dialysis dependent via TDC.  Developed RLE arterial clot in setting of IABP req R fem endarterectomy, thrombectomy and fasciotomy 09/17/13.  Background DM1  Subjective:  PEA / VF arrest overnight Extubated this AM Comfort measures suggested, pt and family considering HD Catheter does not pull back Remains on NE  06/03 0701 - 06/04 0700 In: 1501.6 [I.V.:1501.6] Out: 10 [Blood:10]  Filed Weights   09/18/13 0500 09/19/13 0700 09/20/13 0500  Weight: 126 kg (277 lb 12.5 oz) 129 kg (284 lb 6.3 oz) 131 kg (288 lb 12.8 oz)    Current meds: reviewed including aranesp 200 qWed Current Labs: reviewed    Physical Exam:  Blood pressure 91/62, pulse 135, temperature 98.4 F (36.9 C), temperature source Axillary, resp. rate 12, height 5' 7.5" (1.715 m), weight 131 kg (288 lb 12.8 oz), last menstrual period 08/19/2013, SpO2 96.00%. Awake in chair Tachy, regular Coarse bs b/l Abd s/nt Some 1+ edema in legs Wound vacs at fasciotomy sites  Assessment 1. Dialysis dep't AoCKD5 (Preadmit BL 4.82) 2. Acute Systolic HF on Milrione and NE s/p cardiac arrest 09/19/13 3. R femoral clot s/p endarterectomy, fasciotomy, thrombectomy 09/17/13 4. Anemia on ESA qWed 200 Aranesp 5. VDRF, extubated 6/4 6. Carotid stenosis 7. Hx/o lung cancer and LLL lobectomy  8. DM1 9. Mild hyperkalemia   Plan 1. Discussed with cardiology.  If pt to rec RRT will need CRRT.  Will tPA dwell catheter so that it hopefully could be used and let family / pt come to decision.  No emergent HD needs but K is creeping up.    Notified by RN that pt has opted for [facility] and full medical support.  Will tPA dwell HD Cath x1h and then begin CRRT.  Pearson Grippe MD 09/20/2013, 10:22 AM   Recent Labs Lab 09/19/13 0430 09/19/13 1408 09/19/13 2032 09/20/13 0126 09/20/13 0815  NA 133* 131* 129* 128* 127*   K 3.9 4.3 5.3 5.5* 5.6*  CL 95* 92* 91* 89* 85*  CO2 19 20 17* 16* 16*  GLUCOSE 187* 162* 343* 412* 475*  BUN 28* 33* 35* 38* 41*  CREATININE 5.20* 5.92* 6.05* 6.51* 6.78*  CALCIUM 7.5* 7.9* 11.1* 8.6 8.3*  PHOS 5.5* 6.9*  --  8.9*  8.9*  --     Recent Labs Lab 09/18/13 0430 09/19/13 0430 09/20/13 0126  WBC 7.6 10.2 16.4*  HGB 8.7* 8.7* 9.9*  HCT 27.3* 26.9* 30.3*  MCV 95.8 93.1 94.1  PLT 148* 171 373

## 2013-09-20 NOTE — Procedures (Signed)
Extubation Procedure Note  Patient Details:   Name: Hannah [name] DOB: 12-10-1970 MRN: [id number]   Airway Documentation:     Evaluation  O2 sats: stable throughout Complications: No apparent complications Patient did tolerate procedure well. Bilateral Breath Sounds: Clear;Diminished Suctioning: Airway Yes  Placed on 4L [place] and tolerating well.   April Holding 09/20/2013, 10:02 AM

## 2013-09-20 NOTE — Progress Notes (Signed)
ANTICOAGULATION CONSULT NOTE - Initial Consult  Pharmacy Consult:  Argatroban Indication:  Thromboembolic state  Allergies  Allergen Reactions  . Crestor [Rosuvastatin] Other (See Comments)    Severe muscle weakness  . Nsaids Other (See Comments)    Not allergic, "bad on my kidneys"  . Ciprofloxacin Rash    Patient Measurements: Height: 5' 7.5" (171.5 cm) Weight: 284 lb 6.3 oz (129 kg) IBW/kg (Calculated) : 62.75  Vital Signs: Temp: 98.8 F (37.1 C) (06/04 0000) Temp src: Oral (06/04 0000) BP: 116/51 mmHg (06/04 0000) Pulse Rate: 143 (06/04 0024)  Labs:  Recent Labs  09/18/13 0430  09/19/13 0430 09/19/13 1408 09/19/13 2032 09/19/13 2125 09/20/13 0126  HGB 8.7*  --  8.7*  --   --   --  9.9*  HCT 27.3*  --  26.9*  --   --   --  30.3*  PLT 148*  --  171  --   --   --  373  APTT 37  --  21*  --   --  37  --   CREATININE 3.90*  < > 5.20* 5.92* 6.05*  --  6.51*  TROPONINI  --   --   --   --  <0.30  --   --   < > = values in this interval not displayed.  Estimated Creatinine Clearance: 15.4 ml/min (by C-G formula based on Cr of 6.51).   Medical History: Past Medical History  Diagnosis Date  . High cholesterol   . Diabetic retinopathy   . Peripheral neuropathy     "tips of toes"  . Blind right eye   . CHF (congestive heart failure)   . CAD (coronary artery disease)   . GERD (gastroesophageal reflux disease)   . Hypertension   . History of lung cancer 07/2011    s/p left lower lobectomy  . Diabetes mellitus     IDDM  . Cataract of right eye   . Ulcer of toe of right foot 07/10/2012    great toe  . Breast calcification, left 06/2012  . Acute biphenotypic leukemia   . CKD (chronic kidney disease) stage 5, GFR less than 15 ml/min   . Nephrotic syndrome   . Gastritis     H/o gastritis on prior endoscopy  . Anemia   . Carotid artery disease       Assessment: 40 YOF with right foot ischemia s/p femoral endarterectomy and thrombectomy on 09/17/13 to start  argatroban for anticoagulation.  Also s/p fasciotomy closure on 09/19/13.  Although patient's SRA is negative, Cards and Heme opted to start a DTI.  LFTs WNL and platelets have recovered. APTT is 51.5 seconds at a dose of 0.65 mcg/kg/min. No bleeding noted    Goal of Therapy:  aPTT 50 - 90 seconds Monitor platelets by anticoagulation protocol: Yes   Plan         Continue at current rate and recheck aptt in 4 hours.   Curlene Dolphin   ===========================================

## 2013-09-20 NOTE — Progress Notes (Signed)
120mcg Fentanyl wasted in sink with Romilda Joy, RN

## 2013-09-20 NOTE — Progress Notes (Signed)
PULMONARY / CRITICAL CARE MEDICINE   Name: Hannah [name] MRN: [id number] DOB: 16-Oct-1970    ADMISSION DATE:  09/03/2013 CONSULTATION DATE:  09/17/2013  REFERRING MD :  Irish Lack PRIMARY SERVICE: Cariology  CHIEF COMPLAINT:  respiratory failure  BRIEF PATIENT DESCRIPTION: Hannah [name] is a 43 yo morbidly obese female with PMH of DM1, stage V kidney disease, blindness, h/o lung CA s/p LLL lobectomy in 2013, recently had a high risk stress test on 5/18 which showed ischemia in anterior and apical region and was subsequently admitted for further evaluation. She underwent cath 09/04/2013 which showed chronically occluded RCA and triple vessel dx. CT surgery consulted. EF 22% by Myoview. 30-35% by echo. Hospital course very complicated including significant volume overload requiring CRRT, development of cardiogenic shock requiring IABP placement.  After placement, she developed right foot ischemia requiring vascular surgery consult.  IABP was removed and pt was taken to OR on 6/1 for right femoral endarterectomy, right femoral thrombectomy, four compartment fasciotomy. 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 5/18 carotid dopplers >>> right 60-79% IC stenosis, let 80-99%. 5/18 Admitted after high risk abnormal stress test (anterior and apical ischemia with EF 22%) 5/19 Cardiac cath - Severe 3 vessel obstructive CAD, Elevated LV filling pressures 5/22 CRRT started 5/27 to OR for left IJ HD  permacath 5/28 RHC and IABP placement 5/29 loss of pulses in R foot, IABP d/c'd 5/30 LE arterial duplex >>> Right CFA appears occluded with no color Doppler signal or pulsed Doppler signal. 5/31 RUE venous duplex >>> no obvious DVT/SVT. 6/1 Right femoral endarterectomy, right femoral thrombectomy, four compartment fasciotomy - on vent post operatively 09/18/13 - extubated in SICU 09/19/13 -  extubated yesterday. Doing well. Going to OR for RLE wound closure per RN. Denies questions  LINES / TUBES: R IJ TLC 5/21 >>> 5/21 R IJ HD TLC 5/21 >>> L IJ HD percath 5/27 >>> Right Luiz Blare 5/28 >>> 6/1? ET tube ? Date > extubate 09/18/13  CULTURES: None  ANTIBIOTICS:   Anti-infectives   Start     Dose/Rate Route Frequency Ordered Stop   09/18/13 1200  cefUROXime (ZINACEF) 1.5 g in dextrose 5 % 50 mL IVPB     1.5 g 100 mL/hr over 30 Minutes Intravenous Every 24 hours 09/17/13 1840 09/18/13 1200   09/18/13 0100  cefUROXime (ZINACEF) 1.5 g in dextrose 5 % 50 mL IVPB  Status:  Discontinued     1.5 g 100 mL/hr over 30 Minutes Intravenous Every 12 hours 09/17/13 1749 09/17/13 1840   09/17/13 1330  cefUROXime (ZINACEF) 1.5 g in dextrose 5 % 50 mL IVPB     1.5 g 100 mL/hr over 30 Minutes Intravenous To Surgery 09/17/13 1317 09/17/13 1348   09/12/13 0000  ceFAZolin (ANCEF) IVPB 1 g/50 mL premix    Comments:  Send with pt to OR   1 g 100 mL/hr over 30 Minutes Intravenous On call 09/11/13 1055 09/12/13 0100      SUBJECTIVE:   09/20/13: PEA arrest last night with CPR, ROSC with return of normal mental status. Still pressor dependent. ALl device vascular access have clotted off. RUE swollen. Suspected PE. Poor prognosis per cards: patient indicated desire for extubation and  Possibly comfort care but wants to talk to family after extubation. Cards recommending comfort  VITAL SIGNS: Temp:  [97.5 F (36.4 C)-99.5 F (37.5 C)] 98.4  F (36.9 C) (06/04 0741) Pulse Rate:  [38-146] 133 (06/04 0900) Resp:  [13-34] 15 (06/04 0900) BP: (61-129)/(36-74) 129/69 mmHg (06/04 0800) SpO2:  [87 %-100 %] 100 % (06/04 0900) FiO2 (%):  [40 %-100 %] 40 % (06/04 0900) Weight:  [131 kg (288 lb 12.8 oz)] 131 kg (288 lb 12.8 oz) (06/04 0500) HEMODYNAMICS:   VENTILATOR SETTINGS: Vent Mode:  [-] PSV;CPAP FiO2 (%):  [40 %-100 %] 40 % Set Rate:  [20 bmp] 20 bmp Vt Set:  [500 mL] 500 mL PEEP:  [5 cmH20] 5  cmH20 Plateau Pressure:  [20 cmH20-24 cmH20] 20 cmH20 INTAKE / OUTPUT: Intake/Output     06/03 0701 - 06/04 0700 06/04 0701 - 06/05 0700   I.V. (mL/kg) 1501.6 (11.5) 195.6 (1.5)   NG/GT     IV Piggyback     Total Intake(mL/kg) 1501.6 (11.5) 195.6 (1.5)   Emesis/NG output     Blood 10    Total Output 10     Net +1491.6 +195.6          PHYSICAL EXAMINATION: General: Obese female, no distress Neuro: RAS 0. CAM-ICU neg for delirium. Moves all 4s. Desires extubation.  HEENT: [place]/AT. PERRL, sclerae anicteric. Cardiovascular: RRR, no M/R/G.  Lungs: Respirations even and unlabored on vent.  CTA bilaterally, No W/R/R.  Abdomen: BS x 4, soft, NT/ND.  Musculoskeletal: Rt foot darker Skin: Intact, warm, no rashes.    LABS:  PULMONARY  Recent Labs Lab 09/13/13 1451  09/17/13 1339 09/17/13 1615 09/17/13 1857 09/18/13 0501 09/19/13 2116  PHART 7.333*  --  7.389 7.392 7.354  --  7.288*  PCO2ART 39.4  --  38.9 42.2 31.2*  --  35.0  PO2ART 74.0*  --  94.0 166.0* 107.0*  --  133.0*  HCO3 21.0  --  23.5 25.6* 17.4*  --  16.2*  TCO2 22  --  25 27 18   --  17.3  O2SAT 94.0  < > 97.0 99.0 98.0 66.5 98.5  < > = values in this interval not displayed.  CBC  Recent Labs Lab 09/18/13 0430 09/19/13 0430 09/20/13 0126  HGB 8.7* 8.7* 9.9*  HCT 27.3* 26.9* 30.3*  WBC 7.6 10.2 16.4*  PLT 148* 171 373    COAGULATION No results found for this basename: INR,  in the last 168 hours  CARDIAC    Recent Labs Lab 09/19/13 2032  TROPONINI <0.30   No results found for this basename: PROBNP,  in the last 168 hours   CHEMISTRY  Recent Labs Lab 09/17/13 0500  09/18/13 0430 09/18/13 1546 09/19/13 0430 09/19/13 1408 09/19/13 2032 09/20/13 0126 09/20/13 0815  NA 131*  < > 131* 132* 133* 131* 129* 128* 127*  K 4.1  < > 4.0 4.1 3.9 4.3 5.3 5.5* 5.6*  CL 92*  < > 94* 94* 95* 92* 91* 89* 85*  CO2 25  < > 21 21 19 20  17* 16* 16*  GLUCOSE 270*  < > 245* 223* 187* 162* 343* 412* 475*   BUN 30*  < > 20 25* 28* 33* 35* 38* 41*  CREATININE 4.98*  < > 3.90* 4.57* 5.20* 5.92* 6.05* 6.51* 6.78*  CALCIUM 7.5*  < > 7.6* 7.9* 7.5* 7.9* 11.1* 8.6 8.3*  MG 2.0  --  1.8  --  1.8  --  2.2 2.0  --   PHOS 4.5  < > 4.9* 4.9* 5.5* 6.9*  --  8.9*  8.9*  --   < > = values  in this interval not displayed. Estimated Creatinine Clearance: 14.9 ml/min (by C-G formula based on Cr of 6.78).   LIVER  Recent Labs Lab 09/17/13 0500 09/17/13 1730 09/18/13 1546 09/19/13 0430 09/19/13 1408 09/20/13 0126  AST 10  --   --   --   --   --   ALT 6  --   --   --   --   --   ALKPHOS 66  --   --   --   --   --   BILITOT 0.3  --   --   --   --   --   PROT 4.8*  --   --   --   --   --   ALBUMIN 1.8* 2.3* 1.9* 1.8* 1.7* 1.9*     INFECTIOUS  Recent Labs Lab 09/19/13 2035  LATICACIDVEN 8.2*     ENDOCRINE CBG (last 3)   Recent Labs  09/19/13 2346 09/20/13 0342 09/20/13 0739  GLUCAP 350* 366* 413*         IMAGING x48h  Dg Chest Port 1 View  09/19/2013   CLINICAL DATA:  Evaluate endotracheal tube placement.  EXAM: PORTABLE CHEST - 1 VIEW  COMPARISON:  09/18/2013  FINDINGS: Endotracheal tube tip lies 3.7 cm above the chronic, well positioned. Nasogastric tube has been removed.  Left internal jugular dual-lumen tunneled central venous catheter is stable with its tip in the lower superior vena cava.  Vascular congestion and hazy pulmonary edema is similar to the previous day's study. Small pleural effusions, also unchanged.  No pneumothorax.  IMPRESSION: Endotracheal tube is well positioned. Persistent lung opacities most suggestive of pulmonary edema.   Electronically Signed   By: Lajean Manes M.D.   On: 09/19/2013 20:58   Dg Chest Port 1 View  09/18/2013   CLINICAL DATA:  Check endotracheal tube following readjustment  EXAM: PORTABLE CHEST - 1 VIEW  COMPARISON:  09/17/2013  FINDINGS: Cardiac shadow is stable. An endotracheal tube and nasogastric catheter are again seen. The  endotracheal tube now lie is 3.5 cm above the carina. Patchy infiltrative changes are again seen predominately within the right lower lobe but stable from the prior exam. The left dialysis catheter is again identified and stable. A right-sided venous sheath is again seen and stable.  IMPRESSION: Interval enhancement of the endotracheal tube as described above.  The remainder of the exam is stable from the prior study.   Electronically Signed   By: Inez Catalina M.D.   On: 09/18/2013 11:05      ASSESSMENT / PLAN:  PULMONARY A: VDRF S/p LLL lobectomy secondary to [place] Former smoker Severe COPD - Gold IV (PFT's 5/20: FEV1 28% pre BD, 36% post BD)   -s/p extubation 09/18/13, reintubated 09/19/13 following PEA arrest, suspected PE. Currently meets medical extubation criteria and desires extubation  P:   - extubate -pulmonary toilet - Argatroban started 09/19/13 to continue (started prior to code due to clotting of vessels)  CARDIOVASCULAR A:  Cardiogenic Shock - now requiring levo and milrinone. Acute on chronic systolic heart failure Multivessel CAD- CVTS consulted - not a candidate for CABG due to poor lung function & shock Acute pulmonary edema/ volume overload   - on levophed and milrinone.  Poor prognosis  P:  - Cards following.    VASCULAR A: Right foot ischemia s/p  Right femoral endarterectomy, right femoral thrombectomy, four compartment fasciotomy Carotid stenosis - doppler 08/31/2013 shows R 60-79% ICA stenosis, L 80-99% ICA stenosis   -  multiple clotting of vessels and catheters P: - Vascular following RLE , guarded prognosis for RLE - argatroban   RENAL A:  ESRD - on CVVHD . Caths have clotted P:   - Nephrology following.   GASTROINTESTINAL A:   Obesity P:   - diet per primary team; can eat comfort foods - Pantoprazole.  HEMATOLOGIC A:   Anemia P:  - Goal Hgb > 8 given cardiac hx. - SCD's. - Manage per primary.  INFECTIOUS A:  No acute issues. P:    - Monitor for fever / leukocytosis.  ENDOCRINE A:   DM P:   - SSI.  NEUROLOGIC A:   Acute Encephalopathy    - normal mental status 09/20/13 after PEA  P:   --fent prn for pain  - avoid morphine, oxycodone in ICU setting with renal failure     GLOBAL  09/20/13: D/w Dr Jeffie Pollock cardiologist and mother in law at bedside outside room: mother in law accepting of very poor prognosis and recommendations for comfort. Dr Jeffie Pollock leading discussions with patient and family. Patient has expressed desire for extubation (had indicated DNI prior to code yesterday when feeling well) and she was extubated. She is currently conversing with family about goals and over recommendation from health care team of comfort.  PCCM will follow. Formal decisions on code and comfort not made yet     The patient is critically ill with multiple organ systems failure and requires high complexity decision making for assessment and support, frequent evaluation and titration of therapies, application of advanced monitoring technologies and extensive interpretation of multiple databases.   Critical Care Time devoted to patient care services described in this note is  45  Minutes.  Dr. Brand Males, M.D., [facility].C.P Pulmonary and Critical Care Medicine Staff Physician [place] [facility] Pager: [phone], If no answer or between  15:00h - 7:00h: call [phone]  09/20/2013 10:03 AM        Dr. Brand Males, M.D., F.C.C.P Pulmonary and Critical Care Medicine Staff Physician [place] [facility] Pager: [phone], If no answer or between  15:00h - 7:00h: call [phone]  09/20/2013 9:53 AM

## 2013-09-20 NOTE — Progress Notes (Signed)
VASCULAR LAB PRELIMINARY  PRELIMINARY  PRELIMINARY  PRELIMINARY  Right upper extremity venous Doppler completed.    Preliminary report:  There is acute SVT noted in the right basilic vein from mid upper arm, extending to the confluence with the axillary vein. There is acute DVT noted in the axillary vein, just at the confluence with the basilic.   Iantha Fallen, RVT 09/20/2013, 11:21 AM

## 2013-09-20 NOTE — Progress Notes (Signed)
   Overnight events noted. She had VT/VF arrest int he setting of pressor-dependent cardiomyopathy/shock and possible PE,  She is now intubated but awake and conversant. Remains on dual pressors with progressively threatened limb loss.  I had long talk with her and her family about what I feel is the overall futility of the situation. We discussed pushing on with aggressive care versus switching to comfort care.  Hannah [name] would like to be extubated this am to discuss the options with her family. I have d/w Dr. Chase Caller and we both feel she is extubatable now.   We await her and her family's decision. I think comfort care is most appropriate in this situation.  Full progress note to follow.  Shaune Pascal Sanaiyah Kirchhoff,MD 9:56 AM

## 2013-09-20 NOTE — Progress Notes (Signed)
ANTICOAGULATION CONSULT NOTE - Follow Up Consult  Pharmacy Consult:  Argatroban Indication:  Thromboembolic state  Allergies  Allergen Reactions  . Crestor [Rosuvastatin] Other (See Comments)    Severe muscle weakness  . Nsaids Other (See Comments)    Not allergic, "bad on my kidneys"  . Ciprofloxacin Rash    Patient Measurements: Height: 5' 7.5" (171.5 cm) Weight: 288 lb 12.8 oz (131 kg) IBW/kg (Calculated) : 62.75  Vital Signs: Temp: 98.4 F (36.9 C) (06/04 0741) Temp src: Axillary (06/04 0741) BP: 91/62 mmHg (06/04 0930) Pulse Rate: 135 (06/04 0945)  Labs:  Recent Labs  09/18/13 0430  09/19/13 0430  09/19/13 2032 09/19/13 2125 09/20/13 0126 09/20/13 0815  HGB 8.7*  --  8.7*  --   --   --  9.9*  --   HCT 27.3*  --  26.9*  --   --   --  30.3*  --   PLT 148*  --  171  --   --   --  373  --   APTT 37  --  21*  --   --  37 52* 93*  CREATININE 3.90*  < > 5.20*  < > 6.05*  --  6.51* 6.78*  TROPONINI  --   --   --   --  <0.30  --   --   --   < > = values in this interval not displayed.  Estimated Creatinine Clearance: 14.9 ml/min (by C-G formula based on Cr of 6.78).   Medical History: Past Medical History  Diagnosis Date  . High cholesterol   . Diabetic retinopathy   . Peripheral neuropathy     "tips of toes"  . Blind right eye   . CHF (congestive heart failure)   . CAD (coronary artery disease)   . GERD (gastroesophageal reflux disease)   . Hypertension   . History of lung cancer 07/2011    s/p left lower lobectomy  . Diabetes mellitus     IDDM  . Cataract of right eye   . Ulcer of toe of right foot 07/10/2012    great toe  . Breast calcification, left 06/2012  . Acute biphenotypic leukemia   . CKD (chronic kidney disease) stage 5, GFR less than 15 ml/min   . Nephrotic syndrome   . Gastritis     H/o gastritis on prior endoscopy  . Anemia   . Carotid artery disease       Assessment: 44 YOF with right foot ischemia s/p femoral endarterectomy and  thrombectomy on 09/17/13.  S/p fasciotomy closure on 09/19/13.   PLTC dropped on systemic heparin but SRA for HIT negative.   Argatroban started for anticoagulation, drip rate 0.65 mcg/kg/min with aPTT 50>90 on same rate.  She may be accumulating with worsening renal function.   LFTs WNL and platelets have recovered. No bleeding noted.     Goal of Therapy:  aPTT 50 - 90 seconds Monitor platelets by anticoagulation protocol: Yes   Plan   Decrease Argatroban to 0.50mcg/kg/min to prevent supratherapeutic aPTT Draw aPTT this afternoon  Bonnita Nasuti Pharm.D. CPP, BCPS Clinical Pharmacist [phone] 09/20/2013 2:43 PM

## 2013-09-21 ENCOUNTER — Encounter (HOSPITAL_COMMUNITY): Payer: Self-pay | Admitting: Vascular Surgery

## 2013-09-21 DIAGNOSIS — I2510 Atherosclerotic heart disease of native coronary artery without angina pectoris: Secondary | ICD-10-CM

## 2013-09-21 LAB — RENAL FUNCTION PANEL
ALBUMIN: 1.6 g/dL — AB (ref 3.5–5.2)
Albumin: 1.5 g/dL — ABNORMAL LOW (ref 3.5–5.2)
BUN: 28 mg/dL — ABNORMAL HIGH (ref 6–23)
BUN: 24 mg/dL — AB (ref 6–23)
CALCIUM: 7.6 mg/dL — AB (ref 8.4–10.5)
CALCIUM: 7.5 mg/dL — AB (ref 8.4–10.5)
CO2: 22 mEq/L (ref 19–32)
CO2: 24 meq/L (ref 19–32)
CREATININE: 3.54 mg/dL — AB (ref 0.50–1.10)
Chloride: 93 mEq/L — ABNORMAL LOW (ref 96–112)
Chloride: 93 mEq/L — ABNORMAL LOW (ref 96–112)
Creatinine, Ser: 4.52 mg/dL — ABNORMAL HIGH (ref 0.50–1.10)
GFR calc Af Amer: 17 mL/min — ABNORMAL LOW (ref >90)
GFR, EST AFRICAN AMERICAN: 13 mL/min — AB (ref >90)
GFR, EST NON AFRICAN AMERICAN: 11 mL/min — AB (ref >90)
GFR, EST NON AFRICAN AMERICAN: 15 mL/min — AB (ref >90)
Glucose, Bld: 181 mg/dL — ABNORMAL HIGH (ref 70–99)
Glucose, Bld: 296 mg/dL — ABNORMAL HIGH (ref 70–99)
Phosphorus: 4.6 mg/dL (ref 2.3–4.6)
Phosphorus: 3.3 mg/dL (ref 2.3–4.6)
Potassium: 4.6 mEq/L (ref 3.7–5.3)
Potassium: 4.8 mEq/L (ref 3.7–5.3)
SODIUM: 131 meq/L — AB (ref 137–147)
Sodium: 130 mEq/L — ABNORMAL LOW (ref 137–147)

## 2013-09-21 LAB — GLUCOSE, CAPILLARY
GLUCOSE-CAPILLARY: 340 mg/dL — AB (ref 70–99)
GLUCOSE-CAPILLARY: 220 mg/dL — AB (ref 70–99)
GLUCOSE-CAPILLARY: 201 mg/dL — AB (ref 70–99)
GLUCOSE-CAPILLARY: 114 mg/dL — AB (ref 70–99)
GLUCOSE-CAPILLARY: 114 mg/dL — AB (ref 70–99)
GLUCOSE-CAPILLARY: 113 mg/dL — AB (ref 70–99)
Glucose-Capillary: 117 mg/dL — ABNORMAL HIGH (ref 70–99)
Glucose-Capillary: 123 mg/dL — ABNORMAL HIGH (ref 70–99)
Glucose-Capillary: 150 mg/dL — ABNORMAL HIGH (ref 70–99)
Glucose-Capillary: 162 mg/dL — ABNORMAL HIGH (ref 70–99)
Glucose-Capillary: 162 mg/dL — ABNORMAL HIGH (ref 70–99)
Glucose-Capillary: 219 mg/dL — ABNORMAL HIGH (ref 70–99)
Glucose-Capillary: 263 mg/dL — ABNORMAL HIGH (ref 70–99)
Glucose-Capillary: 264 mg/dL — ABNORMAL HIGH (ref 70–99)
Glucose-Capillary: 289 mg/dL — ABNORMAL HIGH (ref 70–99)
Glucose-Capillary: 296 mg/dL — ABNORMAL HIGH (ref 70–99)
Glucose-Capillary: 382 mg/dL — ABNORMAL HIGH (ref 70–99)
Glucose-Capillary: 396 mg/dL — ABNORMAL HIGH (ref 70–99)

## 2013-09-21 LAB — APTT
APTT: 80 s — AB (ref 24–37)
APTT: 81 s — AB (ref 24–37)

## 2013-09-21 LAB — CBC
HEMATOCRIT: 25.3 % — AB (ref 36.0–46.0)
HEMOGLOBIN: 8 g/dL — AB (ref 12.0–15.0)
MCH: 29.4 pg (ref 26.0–34.0)
MCHC: 31.6 g/dL (ref 30.0–36.0)
MCV: 93 fL (ref 78.0–100.0)
Platelets: 310 10*3/uL (ref 150–400)
RBC: 2.72 MIL/uL — AB (ref 3.87–5.11)
RDW: 17.1 % — ABNORMAL HIGH (ref 11.5–15.5)
WBC: 9.7 10*3/uL (ref 4.0–10.5)

## 2013-09-21 LAB — CARBOXYHEMOGLOBIN
Carboxyhemoglobin: 1.6 % — ABNORMAL HIGH (ref 0.5–1.5)
METHEMOGLOBIN: 0.7 % (ref 0.0–1.5)
O2 Saturation: 64.6 %
Total hemoglobin: 8.1 g/dL — ABNORMAL LOW (ref 12.0–16.0)

## 2013-09-21 LAB — MAGNESIUM: Magnesium: 2.1 mg/dL (ref 1.5–2.5)

## 2013-09-21 MED ORDER — PANTOPRAZOLE SODIUM 40 MG PO TBEC
40.0000 mg | DELAYED_RELEASE_TABLET | Freq: Every day | ORAL | Status: DC
  Administered 2013-09-21 – 2013-10-29 (×39): 40 mg via ORAL
  Filled 2013-09-21 (×39): qty 1

## 2013-09-21 MED ORDER — POLYETHYLENE GLYCOL 3350 17 G PO PACK: 17.0000 g | PACK | Freq: Every day | ORAL | Status: DC | PRN

## 2013-09-21 MED ORDER — INSULIN ASPART 100 UNIT/ML ~~LOC~~ SOLN
1.0000 [IU] | SUBCUTANEOUS | Status: DC
  Administered 2013-09-21: 2 [IU] via SUBCUTANEOUS
  Administered 2013-09-21: 3 [IU] via SUBCUTANEOUS
  Administered 2013-09-21: 1 [IU] via SUBCUTANEOUS

## 2013-09-21 MED ORDER — INSULIN GLARGINE 100 UNIT/ML ~~LOC~~ SOLN
15.0000 [IU] | SUBCUTANEOUS | Status: DC
  Administered 2013-09-21: 15 [IU] via SUBCUTANEOUS
  Filled 2013-09-21 (×2): qty 0.15

## 2013-09-21 MED ORDER — INSULIN GLARGINE 100 UNIT/ML ~~LOC~~ SOLN
20.0000 [IU] | SUBCUTANEOUS | Status: DC
Start: 2013-09-22 — End: 2013-09-22
  Administered 2013-09-22: 20 [IU] via SUBCUTANEOUS
  Filled 2013-09-21: qty 0.2

## 2013-09-21 MED ORDER — INSULIN ASPART 100 UNIT/ML ~~LOC~~ SOLN
0.0000 [IU] | Freq: Every day | SUBCUTANEOUS | Status: DC
  Administered 2013-09-21: 2 [IU] via SUBCUTANEOUS
  Administered 2013-09-22: 3 [IU] via SUBCUTANEOUS
  Administered 2013-09-23: 5 [IU] via SUBCUTANEOUS
  Administered 2013-09-24 – 2013-09-26 (×2): 2 [IU] via SUBCUTANEOUS
  Administered 2013-09-27 – 2013-09-28 (×2): 3 [IU] via SUBCUTANEOUS
  Administered 2013-09-29: 5 [IU] via SUBCUTANEOUS
  Administered 2013-09-30: 2 [IU] via SUBCUTANEOUS
  Administered 2013-10-01: 4 [IU] via SUBCUTANEOUS
  Administered 2013-10-02: 5 [IU] via SUBCUTANEOUS
  Administered 2013-10-03: 4 [IU] via SUBCUTANEOUS
  Administered 2013-10-04: 3 [IU] via SUBCUTANEOUS
  Administered 2013-10-05 – 2013-10-18 (×4): 2 [IU] via SUBCUTANEOUS
  Administered 2013-10-25: 3 [IU] via SUBCUTANEOUS
  Administered 2013-10-26: 2 [IU] via SUBCUTANEOUS
  Administered 2013-10-27: 3 [IU] via SUBCUTANEOUS
  Administered 2013-10-28: 23:00:00 via SUBCUTANEOUS

## 2013-09-21 MED ORDER — INSULIN ASPART 100 UNIT/ML ~~LOC~~ SOLN
0.0000 [IU] | Freq: Three times a day (TID) | SUBCUTANEOUS | Status: DC
  Administered 2013-09-21: 8 [IU] via SUBCUTANEOUS
  Administered 2013-09-22: 5 [IU] via SUBCUTANEOUS
  Administered 2013-09-22 (×2): 8 [IU] via SUBCUTANEOUS
  Administered 2013-09-23 (×3): 5 [IU] via SUBCUTANEOUS
  Administered 2013-09-24: 2 [IU] via SUBCUTANEOUS
  Administered 2013-09-24 (×2): 3 [IU] via SUBCUTANEOUS
  Administered 2013-09-25: 2 [IU] via SUBCUTANEOUS
  Administered 2013-09-25 (×2): 3 [IU] via SUBCUTANEOUS
  Administered 2013-09-26 – 2013-09-27 (×4): 5 [IU] via SUBCUTANEOUS
  Administered 2013-09-27 (×2): 8 [IU] via SUBCUTANEOUS
  Administered 2013-09-28: 4 [IU] via SUBCUTANEOUS
  Administered 2013-09-28: 8 [IU] via SUBCUTANEOUS
  Administered 2013-09-29: 5 [IU] via SUBCUTANEOUS
  Administered 2013-09-29: 20 [IU] via SUBCUTANEOUS
  Administered 2013-09-29: 8 [IU] via SUBCUTANEOUS
  Administered 2013-09-30: 15 [IU] via SUBCUTANEOUS
  Administered 2013-09-30: 5 [IU] via SUBCUTANEOUS
  Administered 2013-09-30: 8 [IU] via SUBCUTANEOUS
  Administered 2013-10-01: 5 [IU] via SUBCUTANEOUS
  Administered 2013-10-01: 15 [IU] via SUBCUTANEOUS
  Administered 2013-10-01 – 2013-10-02 (×2): 8 [IU] via SUBCUTANEOUS
  Administered 2013-10-02 (×2): 15 [IU] via SUBCUTANEOUS
  Administered 2013-10-03: 11 [IU] via SUBCUTANEOUS
  Administered 2013-10-03 – 2013-10-04 (×3): 8 [IU] via SUBCUTANEOUS
  Administered 2013-10-04 (×2): 5 [IU] via SUBCUTANEOUS
  Administered 2013-10-05 (×2): 8 [IU] via SUBCUTANEOUS
  Administered 2013-10-05 – 2013-10-06 (×2): 3 [IU] via SUBCUTANEOUS
  Administered 2013-10-06: 5 [IU] via SUBCUTANEOUS
  Administered 2013-10-06 – 2013-10-08 (×4): 3 [IU] via SUBCUTANEOUS
  Administered 2013-10-08: 2 [IU] via SUBCUTANEOUS
  Administered 2013-10-09 (×2): 3 [IU] via SUBCUTANEOUS
  Administered 2013-10-09 – 2013-10-10 (×2): 2 [IU] via SUBCUTANEOUS
  Administered 2013-10-10: 3 [IU] via SUBCUTANEOUS
  Administered 2013-10-11: 2 [IU] via SUBCUTANEOUS
  Administered 2013-10-12 (×2): 3 [IU] via SUBCUTANEOUS
  Administered 2013-10-14 – 2013-10-16 (×2): 2 [IU] via SUBCUTANEOUS
  Administered 2013-10-17 – 2013-10-18 (×2): 3 [IU] via SUBCUTANEOUS
  Administered 2013-10-18 (×2): 2 [IU] via SUBCUTANEOUS
  Administered 2013-10-19 – 2013-10-22 (×3): 3 [IU] via SUBCUTANEOUS
  Administered 2013-10-23: 2 [IU] via SUBCUTANEOUS
  Administered 2013-10-23 (×2): 3 [IU] via SUBCUTANEOUS
  Administered 2013-10-24: 5 [IU] via SUBCUTANEOUS
  Administered 2013-10-25: 3 [IU] via SUBCUTANEOUS
  Administered 2013-10-25: 5 [IU] via SUBCUTANEOUS
  Administered 2013-10-26: 3 [IU] via SUBCUTANEOUS
  Administered 2013-10-26: 5 [IU] via SUBCUTANEOUS
  Administered 2013-10-27 (×2): 3 [IU] via SUBCUTANEOUS
  Administered 2013-10-27: 5 [IU] via SUBCUTANEOUS
  Administered 2013-10-28 (×2): 3 [IU] via SUBCUTANEOUS
  Administered 2013-10-28: 5 [IU] via SUBCUTANEOUS
  Filled 2013-09-21: qty 0.15

## 2013-09-21 NOTE — Procedures (Signed)
Admit: 09/03/2013 LOS: 75  50H acute systolic HF 2/2 ischemia (not current CABG candidate), milrinone dependent, and AoCKD5 (BL 4.8) now dialysis dependent via TDC. Developed RLE arterial clot in setting of IABP req R fem endarterectomy, thrombectomy and fasciotomy 09/17/13. Background DM1.  PEA/VF arrest 6/3, now pressor dep't and on CRRT   Current CRRT Prescription: Start Date: restarted 09/20/13 Catheter: L IJ BFR: 150 Pre Blood Pump: 1000 DFR: 1500 Replacement Rate: 200 Goal UF: 35mL net negative Anticoagulation: argatroban systemic, clotted on heparin, some concern for HIT Clotting: none since starting  S: Still on high dose NE, milrinone, Amio Extubated, awake and communicative  O: 06/04 0701 - 06/05 0700 In: 1949.6 [I.V.:1949.6] Out: 1422   Filed Weights   09/19/13 0700 09/20/13 0500 09/21/13 0500  Weight: 129 kg (284 lb 6.3 oz) 131 kg (288 lb 12.8 oz) 129.6 kg (285 lb 11.5 oz)     Recent Labs Lab 09/20/13 0126 09/20/13 0815 09/20/13 1558 09/21/13 0419  NA 128* 127* 126* 131*  K 5.5* 5.6* 5.0 4.6  CL 89* 85* 86* 93*  CO2 16* 16* 19 22  GLUCOSE 412* 475* 380* 181*  BUN 38* 41* 44* 28*  CREATININE 6.51* 6.78* 7.26* 4.52*  CALCIUM 8.6 8.3* 8.0* 7.6*  PHOS 8.9*  8.9*  --  8.2* 4.6    Recent Labs Lab 09/19/13 0430 09/20/13 0126 09/21/13 0419  WBC 10.2 16.4* 9.7  HGB 8.7* 9.9* 8.0*  HCT 26.9* 30.3* 25.3*  MCV 93.1 94.1 93.0  PLT 171 373 310    ABG    Component Value Date/Time   PHART 7.288* 09/19/2013 2116   PCO2ART 35.0 09/19/2013 2116   PO2ART 133.0* 09/19/2013 2116   HCO3 16.2* 09/19/2013 2116   TCO2 17.3 09/19/2013 2116   ACIDBASEDEF 9.1* 09/19/2013 2116   O2SAT 64.6 09/21/2013 0415    Plan:  1. Dialysis dependent anuric AoCKD5, in cardiogenic shock req milrinone + NE: cont CRRT, pull fluid as permitted but high dose NE for now.  Good clearance.  No chagne to settings 2. Cardiogenic Shock 3. ? HITT, on argatroban 4. S/p R femoral endarterectomy,  thrombectomy , and fasciotomy 5. CAD, ischemic CM  Pearson Grippe, MD [facility] pgr [phone]

## 2013-09-21 NOTE — Progress Notes (Signed)
ANTICOAGULATION CONSULT NOTE - Follow Up Consult  Pharmacy Consult:  Argatroban Indication:  Thromboembolic state  Allergies  Allergen Reactions  . Crestor [Rosuvastatin] Other (See Comments)    Severe muscle weakness  . Nsaids Other (See Comments)    Not allergic, "bad on my kidneys"  . Ciprofloxacin Rash    Patient Measurements: Height: 5' 7.5" (171.5 cm) Weight: 288 lb 12.8 oz (131 kg) IBW/kg (Calculated) : 62.75  Vital Signs: Temp: 98.2 F (36.8 C) (06/04 2345) Temp src: Oral (06/04 2345) BP: 103/65 mmHg (06/05 0015) Pulse Rate: 114 (06/05 0015)  Labs:  Recent Labs  09/18/13 0430  09/19/13 0430  09/19/13 2032  09/20/13 0126 09/20/13 0815 09/20/13 1558 09/20/13 1650 09/20/13 2030 09/20/13 2330  HGB 8.7*  --  8.7*  --   --   --  9.9*  --   --   --   --   --   HCT 27.3*  --  26.9*  --   --   --  30.3*  --   --   --   --   --   PLT 148*  --  171  --   --   --  373  --   --   --   --   --   APTT 37  --  21*  --   --   < > 52* 93*  --  106* 85* 81*  CREATININE 3.90*  < > 5.20*  < > 6.05*  --  6.51* 6.78* 7.26*  --   --   --   TROPONINI  --   --   --   --  <0.30  --   --   --   --   --   --   --   < > = values in this interval not displayed.  Estimated Creatinine Clearance: 13.9 ml/min (by C-G formula based on Cr of 7.26).   Medical History: Past Medical History  Diagnosis Date  . High cholesterol   . Diabetic retinopathy   . Peripheral neuropathy     "tips of toes"  . Blind right eye   . CHF (congestive heart failure)   . CAD (coronary artery disease)   . GERD (gastroesophageal reflux disease)   . Hypertension   . History of lung cancer 07/2011    s/p left lower lobectomy  . Diabetes mellitus     IDDM  . Cataract of right eye   . Ulcer of toe of right foot 07/10/2012    great toe  . Breast calcification, left 06/2012  . Acute biphenotypic leukemia   . CKD (chronic kidney disease) stage 5, GFR less than 15 ml/min   . Nephrotic syndrome   . Gastritis      H/o gastritis on prior endoscopy  . Anemia   . Carotid artery disease    Assessment: 62 YOF with right foot ischemia s/p femoral endarterectomy and thrombectomy on 09/17/13.  S/p fasciotomy closure on 09/19/13.   APTT is within goal range at 81sec no bleeding noted per   Goal of Therapy:  aPTT 50 - 90 seconds Monitor platelets by anticoagulation protocol: Yes   Plan Continue Argatroban to 0.52mcg/kg/min APTT with am labs  Curlene Dolphin

## 2013-09-21 NOTE — Progress Notes (Signed)
CBG 289 called to Dr Gar Gibbon.order received.

## 2013-09-21 NOTE — Progress Notes (Addendum)
[facility] [place] and orientated.  Over all she is comfortable.   Objective 95/59 130 97.9 F (36.6 C) (Oral) 16 100%  Intake/Output Summary (Last 24 hours) at 09/21/13 0907 Last data filed at 09/21/13 0800  Gross per 24 hour  Intake 1828.92 ml  Output   1494 ml  Net 334.92 ml   Right LE incisions clean and dry Doppler signals DP bilateral dusky right great toe tip, no change   Assessment/Planning: Right femoral endarterectomy, right femoral thrombectomy, four compartment fasciotomy Continue to monitor      Ulyses Amor 09/21/2013 9:07 AM --  Laboratory Lab Results:  Recent Labs  09/20/13 0126 09/21/13 0419  WBC 16.4* 9.7  HGB 9.9* 8.0*  HCT 30.3* 25.3*  PLT 373 310   BMET  Recent Labs  09/20/13 1558 09/21/13 0419  NA 126* 131*  K 5.0 4.6  CL 86* 93*  CO2 19 22  GLUCOSE 380* 181*  BUN 44* 28*  CREATININE 7.26* 4.52*  CALCIUM 8.0* 7.6*    COAG Lab Results  Component Value Date   INR 1.13 09/06/2013   INR 1.09 09/03/2013   INR 0.89 12/04/2012   No results found for this basename: PTT      I agree with the above She thinks her foot is a little better today Still with little sensation below the ankle Positive edema Doppler signal Calf soft  No acute vascular issues  Wells Brabham

## 2013-09-21 NOTE — Progress Notes (Signed)
Pt's blood pressure decreased to 62/22 Dr Haroldine Laws informed Levophed increased to 45 mcgs per order and milrinone decreased to .125 per orders.

## 2013-09-21 NOTE — Progress Notes (Signed)
ADVANCED HEART FAILURE ROUNDING NOTE   SUBJECTIVE  43 yo morbidly obese female with PMH of DM1, stage V kidney disease, blindness, h/o lung CA s/p resection who was recently discharged for heart failure symptom had a high risk stress test which showed ischemia in anterior and apical region. She underwent cath 09/04/2013 which showed chronically occluded RCA and triple vessel dx. CT surgery consulted but not felt to be surgical candidate due to poor PFTs, suboptimal targets, comorbidities and shock. EF 22% by Myoview. 30-35% by echo  Underwent placement of swan and IABP last week for shock (co-ox 38%) last week. Unfortunately developed cold foot and IABP had to be removed.Underwent R femoral thrombectomy and 4 compartment fasciotomy of RLE on 6/1. Just got out of OR after fasciotomy closure.   On 6/3 had VT/VF->PEA arrest 10 mins CPR. U/s shows R axillary DVT  Feels better today. Breathing bette. No orthopnea. Back on CVVHD. SBP in 70s this am so had to cut rate down. Levophed turned up to 38 mcg/min.     CURRENT MEDS . amiodarone  200 mg Oral BID  . antiseptic oral rinse  15 mL Mouth Rinse BID  . antiseptic oral rinse  15 mL Mouth Rinse q12n4p  . aspirin  81 mg Oral Daily  . atorvastatin  80 mg Oral q1800  . calcitRIOL  0.25 mcg Oral Daily  . darbepoetin (ARANESP) injection - NON-DIALYSIS  200 mcg Subcutaneous Q Wed-1800  . insulin aspart  1-3 Units Subcutaneous 6 times per day  . insulin glargine  15 Units Subcutaneous Q24H  . lidocaine (PF)  5 mL Other Once  . metoprolol  2.5 mg Intravenous Once  . pantoprazole sodium  40 mg Per Tube Daily  . sodium chloride  10-40 mL Intracatheter Q12H  . sodium chloride  3 mL Intravenous Q12H  . sodium chloride  3 mL Intravenous Q12H    OBJECTIVE  Filed Vitals:   09/21/13 0645 09/21/13 0700 09/21/13 0749 09/21/13 0800  BP: 92/61 97/43  95/59  Pulse: 123 125  130  Temp:   97.9 F (36.6 C)   TempSrc:   Oral   Resp: 20 24  16   Height:       Weight:      SpO2: 98% 98%  100%    Intake/Output Summary (Last 24 hours) at 09/21/13 0943 Last data filed at 09/21/13 0900  Gross per 24 hour  Intake 1828.92 ml  Output   1626 ml  Net 202.92 ml   Filed Weights   09/19/13 0700 09/20/13 0500 09/21/13 0500  Weight: 129 kg (284 lb 6.3 oz) 131 kg (288 lb 12.8 oz) 129.6 kg (285 lb 11.5 oz)    PHYSICAL EXAM  General:Sitting up in bed. Eating breakfast. NAD Neuro: Alert and oriented X 3. Moves all extremities spontaneous HEENT:  Normal  Neck: Supple without bruits. Chest L subclav PC. R subclav sheat. Unable to see JVD Lungs:  clear Heart: tachy regular. +s3 Abdomen: Soft, non-tender, non-distended, BS + x 4.  Extremities: 3+ edema. R foot  With dusky big toe. Unable to dorsiflex. DP non palpable but dopplerable. R arm swelling   CBC  Recent Labs  09/20/13 0126 09/21/13 0419  WBC 16.4* 9.7  HGB 9.9* 8.0*  HCT 30.3* 25.3*  MCV 94.1 93.0  PLT 373 601   Basic Metabolic Panel  Recent Labs  09/20/13 1343 09/20/13 1558 09/21/13 0419  NA  --  126* 131*  K  --  5.0 4.6  CL  --  86* 93*  CO2  --  19 22  GLUCOSE  --  380* 181*  BUN  --  44* 28*  CREATININE  --  7.26* 4.52*  CALCIUM  --  8.0* 7.6*  MG 2.0  --  2.1  PHOS  --  8.2* 4.6   Liver Function Tests  Recent Labs  09/20/13 1558 09/21/13 0419  ALBUMIN 1.6* 1.6*   Cardiac Enzymes  Recent Labs  09/19/13 2032  TROPONINI <0.30   Thyroid Function Tests No results found for this basename: TSH, T4TOTAL, FREET3, T3FREE, THYROIDAB,  in the last 72 hours  TELE  Sinus tachycardia 120-130s   Radiology/Studies  Dg Chest 2 View  09/06/2013   CLINICAL DATA:  CHF, intermittent dyspnea  EXAM: CHEST  2 VIEW  COMPARISON:  Prior chest x-ray 08/13/2013; prior chest CT 08/22/2013  FINDINGS: Stable cardiac and mediastinal contours. Atherosclerotic calcifications are present within the transverse aorta. Moderate left and small right pleural effusions are similar  compared to prior. There is persistent associated bibasilar atelectasis. Mild pulmonary vascular congestion without overt edema. No pneumothorax. No new focal airspace consolidation. No acute osseous abnormality.  IMPRESSION: 1. Stable moderate left and small right pleural effusions and associated bibasilar atelectasis. 2. Pulmonary vascular congestion without overt edema.   Electronically Signed   By: Jacqulynn Cadet M.D.   On: 09/06/2013 07:55   Ct Chest Wo Contrast  08/22/2013   CLINICAL DATA:  Left lung cancer status post lobectomy. Weight gain. Renal insufficiency.  EXAM: CT CHEST WITHOUT CONTRAST  TECHNIQUE: Multidetector CT imaging of the chest was performed following the standard protocol without IV contrast.  COMPARISON:  Radiographs 08/13/2013 and 08/04/2013.  CT 08/02/2012.  FINDINGS: There are stable postsurgical changes related to prior left lower lobe resection. There has been interval enlargement of several mediastinal lymph nodes. These include 12 mm right paratracheal (image 19), 13 mm precarinal (image 23), and 14 mm AP window (image 25) lymph nodes. Some of these nodes have retained fatty hila. Allowing for the limitations of noncontrast technique, the hila appear stable.  There is stable low-density within the right thyroid lobe. Atherosclerosis of the aorta, great vessels and coronary arteries is noted. The heart size is normal. There is no pericardial effusion.  Moderate size dependent pleural effusions are present bilaterally. There is associated dependent airspace disease in both lower lobes. In addition, there are more focal airspace opacities within the right lower lobe and inferior aspect of the left upper lobe. The latter is somewhat nodular, measuring up to 1.8 cm on image 43. No other focal nodularity or endobronchial lesions are demonstrated.  The visualized upper abdomen has a stable appearance. There is no adrenal mass. There is increased subcutaneous edema throughout the  subcutaneous fat, especially within the anterior aspect of the upper abdomen.  No worrisome osseous findings are demonstrated.  IMPRESSION: 1. As demonstrated radiographically, there are bilateral pleural effusions and bilateral airspace opacities which are new compared with the prior CT. Although there are focal somewhat nodular components in the right lower and left upper lobes, these findings are most likely infectious/inflammatory. 2. Progressive mediastinal lymphadenopathy. Some of the nodes have retained fatty hila and may be reactive. Metastatic disease cannot be completely excluded. 3. Stable atherosclerosis and low-density within the right thyroid lobe. 4. If the airspace opacities and pleural effusions fail to respond to appropriate clinical therapy, follow-up CT or PET-CT may be warranted to exclude metastatic disease.   Electronically Signed  By: Camie Patience M.D.   On: 08/22/2013 10:26   Dg Chest Portable 1 View  08/13/2013   CLINICAL DATA:  Shortness of breath increasing for 2 weeks. History of CHF.  EXAM: PORTABLE CHEST - 1 VIEW  COMPARISON:  08/04/2013  FINDINGS: Again noted are bibasilar lung densities that are suggestive for pleural effusions and atelectasis. Heart size appears to be enlarged. There is some peribronchial thickening and cannot exclude pulmonary edema.  IMPRESSION: Bilateral pleural effusions with basilar atelectasis. There is mild pulmonary edema. Minimal change from the previous examination.   Electronically Signed   By: Markus Daft M.D.   On: 08/13/2013 23:10    ASSESSMENT AND PLAN 1. Cardiogenic shock 2. Acute on chronic systolic HF     EF ~29-52% 3. 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 Acute respiratory failure 5. 3-V CAD 6. Carotid stenosis     - doppler 08/31/2013 shows R 60-79% ICA stenosis, L 80-99% ICA stenosis     - vascular surgery aware 7. Lung CA  h/o LLL lobectomy (2 yr ago due to non-small cell carcinoma)      - PFT 09/05/2013 pre-FEV1 28%, pre-FVC 34%, pre-FEV1/FVC  ratio 68 8. DM1 9. Ischemic R foot s/p R femoral thrombectomy and 4-compartment fasciotomy.  10. V/VF arrest 6/3 11. R axillary DVT  She remains critically ill and dependent on high-dose levophed. Will cut milrinone down to 0.125 and see if that helps with BP. Co-ox ok at 65%. Dialysis limited by hypotension. She has significant motor function loss in RLE.  I think the chances of a meaningful survival continue to be very low but she reiterated her desire for aggressive care and full code status. We will go along with her wishes. Continue levophed/amiodarone.  Will try to wean milrinone. Argatroban for anticoagulation. Appreciate renal's help with CVVHD. She is at least 25-30 pounds volume overloaded but volume removal limited by hypotension.   The patient is critically ill with multiple organ systems failure and requires high complexity decision making for assessment and support, frequent evaluation and titration of therapies, application of advanced monitoring technologies and extensive interpretation of multiple databases.   Critical Care Time devoted to patient care services described in this note is 45 Minutes.   Shaune Pascal Bensimhon,MD 9:43 AM

## 2013-09-21 NOTE — Progress Notes (Signed)
ANTICOAGULATION CONSULT NOTE - Follow Up Consult  Pharmacy Consult:  Argatroban Indication:  Thromboembolic state  Allergies  Allergen Reactions  . Crestor [Rosuvastatin] Other (See Comments)    Severe muscle weakness  . Nsaids Other (See Comments)    Not allergic, "bad on my kidneys"  . Ciprofloxacin Rash    Patient Measurements: Height: 5' 7.5" (171.5 cm) Weight: 285 lb 11.5 oz (129.6 kg) IBW/kg (Calculated) : 62.75  Vital Signs: Temp: 97.9 F (36.6 C) (06/05 0749) Temp src: Oral (06/05 0749) BP: 130/53 mmHg (06/05 1100) Pulse Rate: 131 (06/05 1100)  Labs:  Recent Labs  09/19/13 0430  09/19/13 2032  09/20/13 0126 09/20/13 0815 09/20/13 1558  09/20/13 2030 09/20/13 2330 09/21/13 0419  HGB 8.7*  --   --   --  9.9*  --   --   --   --   --  8.0*  HCT 26.9*  --   --   --  30.3*  --   --   --   --   --  25.3*  PLT 171  --   --   --  373  --   --   --   --   --  310  APTT 21*  --   --   < > 52* 93*  --   < > 85* 81* 80*  CREATININE 5.20*  < > 6.05*  --  6.51* 6.78* 7.26*  --   --   --  4.52*  TROPONINI  --   --  <0.30  --   --   --   --   --   --   --   --   < > = values in this interval not displayed.  Estimated Creatinine Clearance: 22.2 ml/min (by C-G formula based on Cr of 4.52).   Medical History: Past Medical History  Diagnosis Date  . High cholesterol   . Diabetic retinopathy   . Peripheral neuropathy     "tips of toes"  . Blind right eye   . CHF (congestive heart failure)   . CAD (coronary artery disease)   . GERD (gastroesophageal reflux disease)   . Hypertension   . History of lung cancer 07/2011    s/p left lower lobectomy  . Diabetes mellitus     IDDM  . Cataract of right eye   . Ulcer of toe of right foot 07/10/2012    great toe  . Breast calcification, left 06/2012  . Acute biphenotypic leukemia   . CKD (chronic kidney disease) stage 5, GFR less than 15 ml/min   . Nephrotic syndrome   . Gastritis     H/o gastritis on prior endoscopy  .  Anemia   . Carotid artery disease    Assessment: 89 YOF with right foot ischemia s/p femoral endarterectomy and thrombectomy on 09/17/13.  S/p fasciotomy closure on 09/19/13.  Argatroban 0.4 mcg/kg/min APTT 80 sec is within goal range no bleeding noted, CBC low stable.  Goal of Therapy:  aPTT 50 - 90 seconds Monitor platelets by anticoagulation protocol: Yes   Plan Continue Argatroban to 0.16mcg/kg/min APTT with am labs  [place] Pharm.D. CPP, BCPS Clinical Pharmacist [phone] 09/21/2013 12:01 PM

## 2013-09-21 NOTE — Progress Notes (Signed)
Fasciotomy wounds healing DP doppler right and left foot Still pressor dependent Will see again next week Pt not really a candidate for any further procedures at this point Dr Scot Dock on call over the weekend if questions.  Ruta Hinds, MD [facility] [place] Office: [phone] Pager: [phone]

## 2013-09-22 LAB — RENAL FUNCTION PANEL
ALBUMIN: 1.6 g/dL — AB (ref 3.5–5.2)
Albumin: 1.6 g/dL — ABNORMAL LOW (ref 3.5–5.2)
BUN: 19 mg/dL (ref 6–23)
BUN: 16 mg/dL (ref 6–23)
CHLORIDE: 96 meq/L (ref 96–112)
CO2: 23 meq/L (ref 19–32)
CO2: 24 meq/L (ref 19–32)
CREATININE: 2.43 mg/dL — AB (ref 0.50–1.10)
Calcium: 7.5 mg/dL — ABNORMAL LOW (ref 8.4–10.5)
Calcium: 7.4 mg/dL — ABNORMAL LOW (ref 8.4–10.5)
Chloride: 97 mEq/L (ref 96–112)
Creatinine, Ser: 2.9 mg/dL — ABNORMAL HIGH (ref 0.50–1.10)
GFR calc Af Amer: 21 mL/min — ABNORMAL LOW (ref >90)
GFR calc Af Amer: 27 mL/min — ABNORMAL LOW (ref >90)
GFR calc non Af Amer: 23 mL/min — ABNORMAL LOW (ref >90)
GFR, EST NON AFRICAN AMERICAN: 18 mL/min — AB (ref >90)
Glucose, Bld: 267 mg/dL — ABNORMAL HIGH (ref 70–99)
Glucose, Bld: 279 mg/dL — ABNORMAL HIGH (ref 70–99)
Phosphorus: 3 mg/dL (ref 2.3–4.6)
Phosphorus: 2.4 mg/dL (ref 2.3–4.6)
Potassium: 4.8 mEq/L (ref 3.7–5.3)
Potassium: 4.8 mEq/L (ref 3.7–5.3)
SODIUM: 132 meq/L — AB (ref 137–147)
Sodium: 133 mEq/L — ABNORMAL LOW (ref 137–147)

## 2013-09-22 LAB — GLUCOSE, CAPILLARY
GLUCOSE-CAPILLARY: 245 mg/dL — AB (ref 70–99)
GLUCOSE-CAPILLARY: 295 mg/dL — AB (ref 70–99)
GLUCOSE-CAPILLARY: 271 mg/dL — AB (ref 70–99)
Glucose-Capillary: 234 mg/dL — ABNORMAL HIGH (ref 70–99)
Glucose-Capillary: 267 mg/dL — ABNORMAL HIGH (ref 70–99)

## 2013-09-22 LAB — APTT: aPTT: 87 seconds — ABNORMAL HIGH (ref 24–37)

## 2013-09-22 LAB — CBC
HEMATOCRIT: 23.7 % — AB (ref 36.0–46.0)
Hemoglobin: 7.5 g/dL — ABNORMAL LOW (ref 12.0–15.0)
MCH: 30 pg (ref 26.0–34.0)
MCHC: 31.6 g/dL (ref 30.0–36.0)
MCV: 94.8 fL (ref 78.0–100.0)
Platelets: 292 10*3/uL (ref 150–400)
RBC: 2.5 MIL/uL — ABNORMAL LOW (ref 3.87–5.11)
RDW: 17.1 % — AB (ref 11.5–15.5)
WBC: 8.6 10*3/uL (ref 4.0–10.5)

## 2013-09-22 LAB — CARBOXYHEMOGLOBIN
Carboxyhemoglobin: 1.8 % — ABNORMAL HIGH (ref 0.5–1.5)
Methemoglobin: 0.8 % (ref 0.0–1.5)
O2 Saturation: 55.4 %
Total hemoglobin: 7.3 g/dL — ABNORMAL LOW (ref 12.0–16.0)

## 2013-09-22 LAB — PREPARE RBC (CROSSMATCH)

## 2013-09-22 LAB — MAGNESIUM: MAGNESIUM: 2.3 mg/dL (ref 1.5–2.5)

## 2013-09-22 MED ORDER — SODIUM CHLORIDE 0.9 % IV SOLN
INTRAVENOUS | Status: DC
  Filled 2013-09-22: qty 1

## 2013-09-22 MED ORDER — INSULIN GLARGINE 100 UNIT/ML ~~LOC~~ SOLN
5.0000 [IU] | SUBCUTANEOUS | Status: AC
  Administered 2013-09-22: 5 [IU] via SUBCUTANEOUS
  Filled 2013-09-22: qty 0.05

## 2013-09-22 MED ORDER — INSULIN GLARGINE 100 UNIT/ML ~~LOC~~ SOLN
25.0000 [IU] | SUBCUTANEOUS | Status: DC
  Administered 2013-09-23 – 2013-09-29 (×7): 25 [IU] via SUBCUTANEOUS
  Filled 2013-09-22 (×8): qty 0.25

## 2013-09-22 MED ORDER — DOBUTAMINE IN D5W 4-5 MG/ML-% IV SOLN
2.0000 ug/kg/min | INTRAVENOUS | Status: DC
  Administered 2013-09-22: 2 ug/kg/min via INTRAVENOUS
  Administered 2013-09-25 – 2013-10-01 (×6): 5 ug/kg/min via INTRAVENOUS
  Administered 2013-10-02 – 2013-10-07 (×7): 6 ug/kg/min via INTRAVENOUS
  Administered 2013-10-08: 5 ug/kg/min via INTRAVENOUS
  Administered 2013-10-10: 4 ug/kg/min via INTRAVENOUS
  Filled 2013-09-22 (×19): qty 250

## 2013-09-22 NOTE — Progress Notes (Signed)
PULMONARY / CRITICAL CARE MEDICINE   Name: Hannah [name] MRN: [id number] DOB: [birth year]    ADMISSION DATE:  09/03/2013 CONSULTATION DATE:  09/17/2013  REFERRING MD :  Irish Lack PRIMARY SERVICE: Cariology  CHIEF COMPLAINT:  respiratory failure  BRIEF PATIENT DESCRIPTION: Hannah [name] is a 43 yo morbidly obese female with PMH of DM1, stage V kidney disease, blindness, h/o lung CA s/p LLL lobectomy in 2013, recently had a high risk stress test on 5/18 which showed ischemia in anterior and apical region and was subsequently admitted for further evaluation. She underwent cath 09/04/2013 which showed chronically occluded RCA and triple vessel dx. CT surgery consulted. EF 22% by Myoview. 30-35% by echo. Hospital course very complicated including significant volume overload requiring CRRT, development of cardiogenic shock requiring IABP placement.  After placement, she developed right foot ischemia requiring vascular surgery consult.  IABP was removed and pt was taken to OR on 6/1 for right femoral endarterectomy, right femoral thrombectomy, four compartment fasciotomy. 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 5/18 carotid dopplers >>> right 60-79% IC stenosis, let 80-99%. 5/18 Admitted after high risk abnormal stress test (anterior and apical ischemia with EF 22%) 5/19 Cardiac cath - Severe 3 vessel obstructive CAD, Elevated LV filling pressures 5/22 CRRT started 5/27 to OR for left IJ HD  permacath 5/28 RHC and IABP placement 5/29 loss of pulses in R foot, IABP d/c'd 5/30 LE arterial duplex >>> Right CFA appears occluded with no color Doppler signal or pulsed Doppler signal. 5/31 RUE venous duplex >>> no obvious DVT/SVT. 6/1 Right femoral endarterectomy, right femoral thrombectomy, four compartment fasciotomy - on vent post operatively 09/18/13 - extubated in SICU 09/19/13 -  extubated yesterday. Doing well. Going to OR for RLE wound closure per RN. Denies questions  09/20/13: PEA arrest last night with CPR, ROSC with return of normal mental status. Still pressor dependent. ALl device vascular access have clotted off. RUE swollen. Suspected PE. Poor prognosis per cards: patient indicated desire for extubation and  Possibly comfort care but wants to talk to family after extubation. Cards recommending comfort  LINES / TUBES: R IJ TLC 5/21 >>> 5/21 R IJ HD TLC 5/21 >>> L IJ HD percath 5/27 >>> Right Luiz Blare 5/28 >>> 6/1? ET tube ? Date > extubate 09/18/13, 09/19/13 (cardiac arrest) >> 09/20/13  CULTURES: None  ANTIBIOTICS:   Anti-infectives   Start     Dose/Rate Route Frequency Ordered Stop   09/18/13 1200  cefUROXime (ZINACEF) 1.5 g in dextrose 5 % 50 mL IVPB     1.5 g 100 mL/hr over 30 Minutes Intravenous Every 24 hours 09/17/13 1840 09/18/13 1200   09/18/13 0100  cefUROXime (ZINACEF) 1.5 g in dextrose 5 % 50 mL IVPB  Status:  Discontinued     1.5 g 100 mL/hr over 30 Minutes Intravenous Every 12 hours 09/17/13 1749 09/17/13 1840   09/17/13 1330  cefUROXime (ZINACEF) 1.5 g in dextrose 5 % 50 mL IVPB     1.5 g 100 mL/hr over 30 Minutes Intravenous To Surgery 09/17/13 1317 09/17/13 1348   09/12/13 0000  ceFAZolin (ANCEF) IVPB 1 g/50 mL premix    Comments:  Send with pt to OR   1 g 100 mL/hr over 30 Minutes Intravenous On call 09/11/13 1055 09/12/13 0100      SUBJECTIVE:   09/22/13: Full code now.  Milrinone dependent.  On CRRT. Having multiple thrombosies. VVS feels fasciotomy wounds healing but not a candidate for further procedures  VITAL SIGNS: Temp:  [97.8 F (36.6 C)-99.4 F (37.4 C)] 98 F (36.7 C) (06/06 0743) Pulse Rate:  [118-134] 121 (06/06 0730) Resp:  [12-30] 17 (06/06 0730) BP: (62-130)/(22-80) 104/69 mmHg (06/06 0730) SpO2:  [93 %-100 %] 96 % (06/06 0730) Weight:  [131.1 kg (289 lb 0.4 oz)] 131.1 kg (289 lb 0.4 oz) (06/06  0500) HEMODYNAMICS: CVP:  [10 mmHg-27 mmHg] 15 mmHg VENTILATOR SETTINGS:   INTAKE / OUTPUT: Intake/Output     06/05 0701 - 06/06 0700 06/06 0701 - 06/07 0700   P.O. 390    I.V. (mL/kg) 1710 (13)    Total Intake(mL/kg) 2100 (16)    Other 2279 195   Total Output 2279 195   Net -179 -195          PHYSICAL EXAMINATION: General: Obese female, no distress Neuro: RAS 0. CAM-ICU neg for delirium. Moves all 4s. Talking. Eating breakfast HEENT: [place]/AT. PERRL, sclerae anicteric. Cardiovascular: RRR, no M/R/G.  Lungs: t.  CTA bilaterally, No W/R/R.  Abdomen: BS x 4, soft, NT/ND.  Musculoskeletal: Rt foot darker than left Skin: Intact, warm, no rashes.    LABS:  PULMONARY  Recent Labs Lab 09/17/13 1339 09/17/13 1615 09/17/13 1857 09/18/13 0501 09/19/13 2116 09/20/13 1415 09/21/13 0415 09/22/13 0350  PHART 7.389 7.392 7.354  --  7.288*  --   --   --   PCO2ART 38.9 42.2 31.2*  --  35.0  --   --   --   PO2ART 94.0 166.0* 107.0*  --  133.0*  --   --   --   HCO3 23.5 25.6* 17.4*  --  16.2*  --   --   --   TCO2 25 27 18   --  17.3  --   --   --   O2SAT 97.0 99.0 98.0 66.5 98.5 63.6 64.6 55.4    CBC  Recent Labs Lab 09/20/13 0126 09/21/13 0419 09/22/13 0351  HGB 9.9* 8.0* 7.5*  HCT 30.3* 25.3* 23.7*  WBC 16.4* 9.7 8.6  PLT 373 310 292    COAGULATION No results found for this basename: INR,  in the last 168 hours  CARDIAC    Recent Labs Lab 09/19/13 2032  TROPONINI <0.30   No results found for this basename: PROBNP,  in the last 168 hours   CHEMISTRY  Recent Labs Lab 09/19/13 2032 09/20/13 0126 09/20/13 0815 09/20/13 1343 09/20/13 1558 09/21/13 0419 09/21/13 1527 09/22/13 0351  NA 129* 128* 127*  --  126* 131* 130* 132*  K 5.3 5.5* 5.6*  --  5.0 4.6 4.8 4.8  CL 91* 89* 85*  --  86* 93* 93* 96  CO2 17* 16* 16*  --  19 22 24 23   GLUCOSE 343* 412* 475*  --  380* 181* 296* 267*  BUN 35* 38* 41*  --  44* 28* 24* 19  CREATININE 6.05* 6.51* 6.78*  --   7.26* 4.52* 3.54* 2.90*  CALCIUM 11.1* 8.6 8.3*  --  8.0* 7.6* 7.5* 7.5*  MG 2.2 2.0  --  2.0  --  2.1  --  2.3  PHOS  --  8.9*  8.9*  --   --  8.2* 4.6 3.3 3.0   Estimated Creatinine Clearance: 34.8 ml/min (by C-G formula based on Cr of 2.9).   LIVER  Recent Labs Lab 09/17/13 0500  09/20/13 0126 09/20/13 1558 09/21/13 0419  09/21/13 1527 09/22/13 0351  AST 10  --   --   --   --   --   --   ALT 6  --   --   --   --   --   --   ALKPHOS 66  --   --   --   --   --   --   BILITOT 0.3  --   --   --   --   --   --   PROT 4.8*  --   --   --   --   --   --   ALBUMIN 1.8*  < > 1.9* 1.6* 1.6* 1.5* 1.6*  < > = values in this interval not displayed.   INFECTIOUS  Recent Labs Lab 09/19/13 2035  LATICACIDVEN 8.2*     ENDOCRINE CBG (last 3)   Recent Labs  09/21/13 1954 09/21/13 2142 09/22/13 0741  GLUCAP 263* 234* 245*         IMAGING x48h  Dg Chest Port 1 View  09/20/2013   CLINICAL DATA:  Central line insertion.  EXAM: PORTABLE CHEST - 1 VIEW  COMPARISON:  09/19/2013.  FINDINGS: Right subclavian central line tip projects over the SVC. Left IJ dialysis catheter tip projects over the lower SVC. Heart size stable. Mild diffuse interstitial prominence and indistinctness with overall improvement in aeration from 09/19/2013. No pneumothorax. Small bilateral effusions.  IMPRESSION: 1. Right subclavian central line placement without complicating feature. 2. Improving congestive heart failure and left lower lobe collapse/consolidation.   Electronically Signed   By: Lorin Picket M.D.   On: 09/20/2013 16:57      ASSESSMENT / PLAN:  PULMONARY A: VDRF S/p LLL lobectomy secondary to [place] Former smoker Severe COPD - Gold IV (PFT's 5/20: FEV1 28% pre BD, 36% post BD)   -s/p extubation 09/18/13, reintubated 09/19/13 following PEA arrest, suspected PE and extubated 09/20/13. DOing well 6/615 on nasal cannula P:   -pulmonary toilet emphasize  - goal pulse ox >  88%  CARDIOVASCULAR A:  Cardiogenic Shock - now requiring levo and milrinone. Acute on chronic systolic heart failure Multivessel CAD- CVTS consulted - not a candidate for CABG due to poor lung function & shock Acute pulmonary edema/ volume overload Multiple thromboses   - on levophed and milrinone.  Poor prognosis   P:  - Cards following.    VASCULAR A: Right foot ischemia s/p  Right femoral endarterectomy, right femoral thrombectomy, four compartment fasciotomy Carotid stenosis - doppler 08/31/2013 shows R 60-79% ICA stenosis, L 80-99% ICA stenosis  - multiple clotting of vessels and catheters. - Argatroban started 09/19/13 to continue (started prior to code due to clotting of vessels)  P: - Vascular following RLE , guarded prognosis for RLE - argatroban   RENAL A:  ESRD - on CVVHD . Caths have clotted P:   - Nephrology following.   GASTROINTESTINAL A:   Obesity P:   - diet per primary team; can eat comfort foods - Pantoprazole.  HEMATOLOGIC A:   Anemia  - hgb  Is < 8gm%  P:  - Goal Hgb > 8 given cardiac hx but will check with cards first if goal should be > 8mg % still - SCD's. - Manage per primary.  INFECTIOUS A:  No acute issues. P:   - Monitor for fever / leukocytosis.  ENDOCRINE A:   DM P:   - SSI.  NEUROLOGIC A:   Acute Encephalopathy    -  normal mental status fter PEA 09/20/13  P:   --fent prn for pain  - avoid morphine, oxycodone in ICU setting with renal failure     GLOBAL  09/20/13: D/w Dr Jeffie Pollock cardiologist and mother in law at bedside outside room: mother in law accepting of very poor prognosis and recommendations for comfort. Dr Jeffie Pollock leading discussions with patient and family. Patient has expressed desire for extubation (had indicated DNI prior to code yesterday when feeling well) and she was extubated. She is currently conversing with family about goals and over recommendation from health care team of comfort.  PCCM will  follow. Formal decisions on code and comfort not made yet    09/22/13: Patient and her mother in law updated    The patient is critically ill with multiple organ systems failure and requires high complexity decision making for assessment and support, frequent evaluation and titration of therapies, application of advanced monitoring technologies and extensive interpretation of multiple databases.   Critical Care Time devoted to patient care services described in this note is  35  Minutes.  Dr. Brand Males, M.D., [facility].C.P Pulmonary and Critical Care Medicine Staff Physician [facility] Pager: [phone], If no answer or between  15:00h - 7:00h: call [phone]  09/22/2013 9:45 AM

## 2013-09-22 NOTE — Procedures (Signed)
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 Developed RLE arterial clot in setting of IABP req R fem endarterectomy, thrombectomy and fasciotomy 09/17/13. Background DM1.  PEA/VF arrest 6/3, now pressor dep't and on CRRT.  Having mult throboses despite heparin now on argatroban.   Current CRRT Prescription: Start Date: restarted 09/20/13 Catheter: L IJ BFR: 150 Pre Blood Pump: 1000 DFR: 1500 Replacement Rate: 200 Goal UF: 19mL net negative Anticoagulation: argatroban systemic, clotted on heparin, some concern for HIT Clotting: not excessive  S: Still on high dose NE, milrinone, Amio. Argatroban Using leg for BP cuff Extubated, awake and communicative Nn complaints this AM  O: 06/05 0701 - 06/06 0700 In: 2100 [P.O.:390; I.V.:1710] Out: 2279   Filed Weights   09/20/13 0500 09/21/13 0500 09/22/13 0500  Weight: 131 kg (288 lb 12.8 oz) 129.6 kg (285 lb 11.5 oz) 131.1 kg (289 lb 0.4 oz)     Recent Labs Lab 09/21/13 0419 09/21/13 1527 09/22/13 0351  NA 131* 130* 132*  K 4.6 4.8 4.8  CL 93* 93* 96  CO2 22 24 23   GLUCOSE 181* 296* 267*  BUN 28* 24* 19  CREATININE 4.52* 3.54* 2.90*  CALCIUM 7.6* 7.5* 7.5*  PHOS 4.6 3.3 3.0    Recent Labs Lab 09/20/13 0126 09/21/13 0419 09/22/13 0351  WBC 16.4* 9.7 8.6  HGB 9.9* 8.0* 7.5*  HCT 30.3* 25.3* 23.7*  MCV 94.1 93.0 94.8  PLT 373 310 292    ABG    Component Value Date/Time   PHART 7.288* 09/19/2013 2116   PCO2ART 35.0 09/19/2013 2116   PO2ART 133.0* 09/19/2013 2116   HCO3 16.2* 09/19/2013 2116   TCO2 17.3 09/19/2013 2116   ACIDBASEDEF 9.1* 09/19/2013 2116   O2SAT 55.4 09/22/2013 0350    Plan:  1. Dialysis dependent anuric AoCKD5, in cardiogenic shock req milrinone + NE: cont CRRT, pull fluid as permitted but high dose NE for now.  Good clearance.  No chagne to settings 2. Cardiogenic Shock, milrinone  dependent, ischemic and not a cabg candidate 3. ? HITT, on argatroban 4. S/p R femoral endarterectomy, thrombectomy , and fasciotomy 5. CAD, ischemic CM  Pearson Grippe, MD [facility] pgr [phone]

## 2013-09-22 NOTE — Progress Notes (Signed)
ADVANCED HEART FAILURE ROUNDING NOTE   SUBJECTIVE  43 yo morbidly obese female with PMH of DM1, stage V kidney disease, blindness, h/o lung CA s/p resection who was recently discharged for heart failure symptom had a high risk stress test which showed ischemia in anterior and apical region. She underwent cath 09/04/2013 which showed chronically occluded RCA and triple vessel dx. CT surgery consulted but not felt to be surgical candidate due to poor PFTs, suboptimal targets, comorbidities and shock. EF 22% by Myoview. 30-35% by echo  Underwent placement of swan and IABP last week for shock (co-ox 38%). Unfortunately developed cold foot and IABP had to be removed.Underwent R femoral thrombectomy and 4 compartment fasciotomy of RLE on 6/1.On 6/3 had VT/VF->PEA arrest 10 mins CPR. U/s shows R axillary DVT  Feels good today. Breathing much better. Wants to get up into chair. Tolerating CVVHD but still on levophed 84mcg/min. Co-ox 65%->55%. Hgb 7.5    CURRENT MEDS . antiseptic oral rinse  15 mL Mouth Rinse BID  . antiseptic oral rinse  15 mL Mouth Rinse q12n4p  . aspirin  81 mg Oral Daily  . atorvastatin  80 mg Oral q1800  . calcitRIOL  0.25 mcg Oral Daily  . darbepoetin (ARANESP) injection - NON-DIALYSIS  200 mcg Subcutaneous Q Wed-1800  . insulin aspart  0-15 Units Subcutaneous TID WC  . insulin aspart  0-5 Units Subcutaneous QHS  . insulin glargine  20 Units Subcutaneous Q24H  . lidocaine (PF)  5 mL Other Once  . pantoprazole  40 mg Oral Daily  . sodium chloride  10-40 mL Intracatheter Q12H    OBJECTIVE  Filed Vitals:   09/22/13 0700 09/22/13 0715 09/22/13 0730 09/22/13 0743  BP: 90/44 95/32 104/69   Pulse: 118 120 121   Temp:    98 F (36.7 C)  TempSrc:    Oral  Resp: 12 17 17    Height:      Weight:      SpO2: 99% 94% 96%     Intake/Output Summary (Last 24 hours) at 09/22/13 1126 Last data filed at 09/22/13 1000  Gross per 24 hour  Intake 1622.8 ml  Output   2140 ml  Net  -517.2 ml   Filed Weights   09/20/13 0500 09/21/13 0500 09/22/13 0500  Weight: 131 kg (288 lb 12.8 oz) 129.6 kg (285 lb 11.5 oz) 131.1 kg (289 lb 0.4 oz)    PHYSICAL EXAM  General:Sitting up on side of bed.  NAD Neuro: Alert and oriented X 3. Moves all extremities spontaneous HEENT:  Normal  Neck: Supple without bruits. Chest L subclav PC. R subclav TLC. Unable to see JVD Lungs:  clear Heart: tachy regular. +s3 Abdomen: Soft, non-tender, non-distended, BS + x 4.  Extremities: 3+ edema. R foot  With dusky big toe. Strength improved DP non palpable but dopplerable. R arm swelling   CBC  Recent Labs  09/21/13 0419 09/22/13 0351  WBC 9.7 8.6  HGB 8.0* 7.5*  HCT 25.3* 23.7*  MCV 93.0 94.8  PLT 310 732   Basic Metabolic Panel  Recent Labs  09/21/13 0419 09/21/13 1527 09/22/13 0351  NA 131* 130* 132*  K 4.6 4.8 4.8  CL 93* 93* 96  CO2 22 24 23   GLUCOSE 181* 296* 267*  BUN 28* 24* 19  CREATININE 4.52* 3.54* 2.90*  CALCIUM 7.6* 7.5* 7.5*  MG 2.1  --  2.3  PHOS 4.6 3.3 3.0   Liver Function Tests  Recent Labs  09/21/13  1527 09/22/13 0351  ALBUMIN 1.5* 1.6*   Cardiac Enzymes  Recent Labs  09/19/13 2032  TROPONINI <0.30   Thyroid Function Tests No results found for this basename: TSH, T4TOTAL, FREET3, T3FREE, THYROIDAB,  in the last 72 hours  TELE  Sinus tachycardia 120-130s   Radiology/Studies  Dg Chest 2 View  09/06/2013   CLINICAL DATA:  CHF, intermittent dyspnea  EXAM: CHEST  2 VIEW  COMPARISON:  Prior chest x-ray 08/13/2013; prior chest CT 08/22/2013  FINDINGS: Stable cardiac and mediastinal contours. Atherosclerotic calcifications are present within the transverse aorta. Moderate left and small right pleural effusions are similar compared to prior. There is persistent associated bibasilar atelectasis. Mild pulmonary vascular congestion without overt edema. No pneumothorax. No new focal airspace consolidation. No acute osseous abnormality.   IMPRESSION: 1. Stable moderate left and small right pleural effusions and associated bibasilar atelectasis. 2. Pulmonary vascular congestion without overt edema.   Electronically Signed   By: Jacqulynn Cadet M.D.   On: 09/06/2013 07:55   Ct Chest Wo Contrast  08/22/2013   CLINICAL DATA:  Left lung cancer status post lobectomy. Weight gain. Renal insufficiency.  EXAM: CT CHEST WITHOUT CONTRAST  TECHNIQUE: Multidetector CT imaging of the chest was performed following the standard protocol without IV contrast.  COMPARISON:  Radiographs 08/13/2013 and 08/04/2013.  CT 08/02/2012.  FINDINGS: There are stable postsurgical changes related to prior left lower lobe resection. There has been interval enlargement of several mediastinal lymph nodes. These include 12 mm right paratracheal (image 19), 13 mm precarinal (image 23), and 14 mm AP window (image 25) lymph nodes. Some of these nodes have retained fatty hila. Allowing for the limitations of noncontrast technique, the hila appear stable.  There is stable low-density within the right thyroid lobe. Atherosclerosis of the aorta, great vessels and coronary arteries is noted. The heart size is normal. There is no pericardial effusion.  Moderate size dependent pleural effusions are present bilaterally. There is associated dependent airspace disease in both lower lobes. In addition, there are more focal airspace opacities within the right lower lobe and inferior aspect of the left upper lobe. The latter is somewhat nodular, measuring up to 1.8 cm on image 43. No other focal nodularity or endobronchial lesions are demonstrated.  The visualized upper abdomen has a stable appearance. There is no adrenal mass. There is increased subcutaneous edema throughout the subcutaneous fat, especially within the anterior aspect of the upper abdomen.  No worrisome osseous findings are demonstrated.  IMPRESSION: 1. As demonstrated radiographically, there are bilateral pleural effusions and  bilateral airspace opacities which are new compared with the prior CT. Although there are focal somewhat nodular components in the right lower and left upper lobes, these findings are most likely infectious/inflammatory. 2. Progressive mediastinal lymphadenopathy. Some of the nodes have retained fatty hila and may be reactive. Metastatic disease cannot be completely excluded. 3. Stable atherosclerosis and low-density within the right thyroid lobe. 4. If the airspace opacities and pleural effusions fail to respond to appropriate clinical therapy, follow-up CT or PET-CT may be warranted to exclude metastatic disease.   Electronically Signed   By: Camie Patience M.D.   On: 08/22/2013 10:26   Dg Chest Portable 1 View  08/13/2013   CLINICAL DATA:  Shortness of breath increasing for 2 weeks. History of CHF.  EXAM: PORTABLE CHEST - 1 VIEW  COMPARISON:  08/04/2013  FINDINGS: Again noted are bibasilar lung densities that are suggestive for pleural effusions and atelectasis. Heart size appears to  be enlarged. There is some peribronchial thickening and cannot exclude pulmonary edema.  IMPRESSION: Bilateral pleural effusions with basilar atelectasis. There is mild pulmonary edema. Minimal change from the previous examination.   Electronically Signed   By: Markus Daft M.D.   On: 08/13/2013 23:10    ASSESSMENT AND PLAN 1. Cardiogenic shock 2. Acute on chronic systolic HF     EF ~14-43% 3. 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 Acute respiratory failure 5. 3-V CAD 6. Carotid stenosis     - doppler 08/31/2013 shows R 60-79% ICA stenosis, L 80-99% ICA stenosis     - vascular surgery aware 7. 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 DM1 9. Ischemic R foot s/p R femoral thrombectomy and 4-compartment fasciotomy.  10. V/VF arrest 6/3 11. R axillary DVT  She looks better but still on high-dose levophed. Will switch milrinone to  dobutamine and see if that helps wean levophed. Watch for worsening tachycardia.   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 Will continue. Eventual switch to warfarin. Will place lymphedema sleeve on R arm.   I think the chances of a meaningful survival continue to be very low but she reiterated her desire for aggressive care and full code status. We will go along with her wishes. Wi   Appreciate renal's help with CVVHD. She is at least 25-30 pounds volume overloaded.  The patient is critically ill with multiple organ systems failure and requires high complexity decision making for assessment and support, frequent evaluation and titration of therapies, application of advanced monitoring technologies and extensive interpretation of multiple databases.   Critical Care Time devoted to patient care services described in this note is 45 Minutes.   Shaune Pascal Bensimhon,MD 11:26 AM

## 2013-09-22 NOTE — Progress Notes (Signed)
ANTICOAGULATION CONSULT NOTE - Follow Up Consult  Pharmacy Consult:  Argatroban Indication:  Thromboembolic state  Allergies  Allergen Reactions  . Crestor [Rosuvastatin] Other (See Comments)    Severe muscle weakness  . Nsaids Other (See Comments)    Not allergic, "bad on my kidneys"  . Ciprofloxacin Rash    Patient Measurements: Height: 5' 7.5" (171.5 cm) Weight: 289 lb 0.4 oz (131.1 kg) IBW/kg (Calculated) : 62.75  Vital Signs: Temp: 98 F (36.7 C) (06/06 0743) Temp src: Oral (06/06 0743) BP: 104/69 mmHg (06/06 0730) Pulse Rate: 121 (06/06 0730)  Labs:  Recent Labs  09/19/13 2032  09/20/13 0126  09/20/13 2330 09/21/13 0419 09/21/13 1527 09/22/13 0351  HGB  --   < > 9.9*  --   --  8.0*  --  7.5*  HCT  --   --  30.3*  --   --  25.3*  --  23.7*  PLT  --   --  373  --   --  310  --  292  APTT  --   < > 52*  < > 81* 80*  --  87*  CREATININE 6.05*  --  6.51*  < >  --  4.52* 3.54* 2.90*  TROPONINI <0.30  --   --   --   --   --   --   --   < > = values in this interval not displayed.  Estimated Creatinine Clearance: 34.8 ml/min (by C-G formula based on Cr of 2.9).  Assessment: 32 YOF with right foot ischemia s/p femoral endarterectomy and thrombectomy on 09/17/13.  S/p fasciotomy closure on 09/19/13. HIT panel negative. Now on CVVHD. Argatroban required lowering d/t high levels, now therapeutic x2 on 0.68mcg/kg/min- this morning's aPTT 87 seconds. Hgb low, plts nml. No bleeding noted.  Goal of Therapy:  aPTT 50 - 90 seconds Monitor platelets by anticoagulation protocol: Yes   Plan 1. Continue Argatroban at 0.100mcg/kg/min 2. Daily aPTT  Laterria Lasota D. Elta Angell, PharmD, BCPS Clinical Pharmacist Pager: [phone] 09/22/2013 10:46 AM

## 2013-09-23 DIAGNOSIS — I82A19 Acute embolism and thrombosis of unspecified axillary vein: Secondary | ICD-10-CM

## 2013-09-23 DIAGNOSIS — D649 Anemia, unspecified: Secondary | ICD-10-CM

## 2013-09-23 DIAGNOSIS — I509 Heart failure, unspecified: Secondary | ICD-10-CM

## 2013-09-23 DIAGNOSIS — E11319 Type 2 diabetes mellitus with unspecified diabetic retinopathy without macular edema: Secondary | ICD-10-CM

## 2013-09-23 DIAGNOSIS — E1139 Type 2 diabetes mellitus with other diabetic ophthalmic complication: Secondary | ICD-10-CM

## 2013-09-23 LAB — PHOSPHORUS: PHOSPHORUS: 2.4 mg/dL (ref 2.3–4.6)

## 2013-09-23 LAB — HEPATIC FUNCTION PANEL
ALBUMIN: 1.6 g/dL — AB (ref 3.5–5.2)
ALT: 289 U/L — ABNORMAL HIGH (ref 0–35)
AST: 136 U/L — ABNORMAL HIGH (ref 0–37)
Alkaline Phosphatase: 207 U/L — ABNORMAL HIGH (ref 39–117)
Bilirubin, Direct: 0.2 mg/dL (ref 0.0–0.3)
Indirect Bilirubin: 0.3 mg/dL (ref 0.3–0.9)
Total Bilirubin: 0.5 mg/dL (ref 0.3–1.2)
Total Protein: 5.3 g/dL — ABNORMAL LOW (ref 6.0–8.3)

## 2013-09-23 LAB — CARBOXYHEMOGLOBIN
CARBOXYHEMOGLOBIN: 1.8 % — AB (ref 0.5–1.5)
CARBOXYHEMOGLOBIN: 1.8 % — AB (ref 0.5–1.5)
METHEMOGLOBIN: 0.7 % (ref 0.0–1.5)
Methemoglobin: 0.9 % (ref 0.0–1.5)
O2 Saturation: 65.2 %
O2 Saturation: 45.2 %
TOTAL HEMOGLOBIN: 9 g/dL — AB (ref 12.0–16.0)
Total hemoglobin: 7.6 g/dL — ABNORMAL LOW (ref 12.0–16.0)

## 2013-09-23 LAB — GLUCOSE, CAPILLARY
GLUCOSE-CAPILLARY: 222 mg/dL — AB (ref 70–99)
GLUCOSE-CAPILLARY: 217 mg/dL — AB (ref 70–99)
Glucose-Capillary: 170 mg/dL — ABNORMAL HIGH (ref 70–99)
Glucose-Capillary: 172 mg/dL — ABNORMAL HIGH (ref 70–99)
Glucose-Capillary: 242 mg/dL — ABNORMAL HIGH (ref 70–99)
Glucose-Capillary: 248 mg/dL — ABNORMAL HIGH (ref 70–99)
Glucose-Capillary: 252 mg/dL — ABNORMAL HIGH (ref 70–99)

## 2013-09-23 LAB — BASIC METABOLIC PANEL
BUN: 15 mg/dL (ref 6–23)
CO2: 25 mEq/L (ref 19–32)
Calcium: 7.5 mg/dL — ABNORMAL LOW (ref 8.4–10.5)
Chloride: 99 mEq/L (ref 96–112)
Creatinine, Ser: 2.39 mg/dL — ABNORMAL HIGH (ref 0.50–1.10)
GFR, EST AFRICAN AMERICAN: 27 mL/min — AB (ref >90)
GFR, EST NON AFRICAN AMERICAN: 23 mL/min — AB (ref >90)
Glucose, Bld: 237 mg/dL — ABNORMAL HIGH (ref 70–99)
Potassium: 4.5 mEq/L (ref 3.7–5.3)
SODIUM: 135 meq/L — AB (ref 137–147)

## 2013-09-23 LAB — MAGNESIUM: Magnesium: 2.5 mg/dL (ref 1.5–2.5)

## 2013-09-23 LAB — CBC
HEMATOCRIT: 24.1 % — AB (ref 36.0–46.0)
HEMOGLOBIN: 7.6 g/dL — AB (ref 12.0–15.0)
MCH: 29.9 pg (ref 26.0–34.0)
MCHC: 31.5 g/dL (ref 30.0–36.0)
MCV: 94.9 fL (ref 78.0–100.0)
Platelets: 238 10*3/uL (ref 150–400)
RBC: 2.54 MIL/uL — AB (ref 3.87–5.11)
RDW: 18.1 % — ABNORMAL HIGH (ref 11.5–15.5)
WBC: 6.6 10*3/uL (ref 4.0–10.5)

## 2013-09-23 LAB — RENAL FUNCTION PANEL
ALBUMIN: 1.7 g/dL — AB (ref 3.5–5.2)
BUN: 14 mg/dL (ref 6–23)
CALCIUM: 7.7 mg/dL — AB (ref 8.4–10.5)
CHLORIDE: 96 meq/L (ref 96–112)
CO2: 24 mEq/L (ref 19–32)
Creatinine, Ser: 2.1 mg/dL — ABNORMAL HIGH (ref 0.50–1.10)
GFR calc Af Amer: 32 mL/min — ABNORMAL LOW (ref >90)
GFR, EST NON AFRICAN AMERICAN: 27 mL/min — AB (ref >90)
Glucose, Bld: 252 mg/dL — ABNORMAL HIGH (ref 70–99)
Phosphorus: 2.2 mg/dL — ABNORMAL LOW (ref 2.3–4.6)
Potassium: 4.6 mEq/L (ref 3.7–5.3)
Sodium: 132 mEq/L — ABNORMAL LOW (ref 137–147)

## 2013-09-23 LAB — APTT: APTT: 82 s — AB (ref 24–37)

## 2013-09-23 LAB — PREPARE RBC (CROSSMATCH)

## 2013-09-23 MED ORDER — MIDODRINE HCL 2.5 MG PO TABS
2.5000 mg | ORAL_TABLET | Freq: Three times a day (TID) | ORAL | Status: DC
  Administered 2013-09-23 – 2013-09-25 (×6): 2.5 mg via ORAL
  Filled 2013-09-23 (×9): qty 1

## 2013-09-23 MED ORDER — POLYETHYLENE GLYCOL 3350 17 G PO PACK
17.0000 g | PACK | Freq: Every day | ORAL | Status: DC
  Administered 2013-09-23 – 2013-10-28 (×10): 17 g via ORAL
  Filled 2013-09-23 (×37): qty 1

## 2013-09-23 MED ORDER — SORBITOL 70 % SOLN
30.0000 mL | Status: AC
  Administered 2013-09-23: 30 mL via ORAL
  Filled 2013-09-23: qty 30

## 2013-09-23 MED ORDER — FLEET ENEMA 7-19 GM/118ML RE ENEM
1.0000 | ENEMA | Freq: Once | RECTAL | Status: AC
Start: 2013-09-23 — End: 2013-09-23
  Administered 2013-09-23: 1 via RECTAL
  Filled 2013-09-23: qty 1

## 2013-09-23 NOTE — Progress Notes (Signed)
PULMONARY / CRITICAL CARE MEDICINE   Name: Hannah [name] MRN: [id number] DOB: [birth year]    ADMISSION DATE:  09/03/2013 CONSULTATION DATE:  09/17/2013  REFERRING MD :  Irish Lack PRIMARY SERVICE: Cariology  CHIEF COMPLAINT:  respiratory failure  BRIEF PATIENT DESCRIPTION: Hannah [name] is a 43 yo morbidly obese female with PMH of DM1, stage V kidney disease, blindness, h/o lung CA s/p LLL lobectomy in 2013, recently had a high risk stress test on 5/18 which showed ischemia in anterior and apical region and was subsequently admitted for further evaluation. She underwent cath 09/04/2013 which showed chronically occluded RCA and triple vessel dx. CT surgery consulted. EF 22% by Myoview. 30-35% by echo. Hospital course very complicated including significant volume overload requiring CRRT, development of cardiogenic shock requiring IABP placement.  After placement, she developed right foot ischemia requiring vascular surgery consult.  IABP was removed and pt was taken to OR on 6/1 for right femoral endarterectomy, right femoral thrombectomy, four compartment fasciotomy. 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 5/18 carotid dopplers >>> right 60-79% IC stenosis, let 80-99%. 5/18 Admitted after high risk abnormal stress test (anterior and apical ischemia with EF 22%) 5/19 Cardiac cath - Severe 3 vessel obstructive CAD, Elevated LV filling pressures 5/22 CRRT started 5/27 to OR for left IJ HD  permacath 5/28 RHC and IABP placement 5/29 loss of pulses in R foot, IABP d/c'd 5/30 LE arterial duplex >>> Right CFA appears occluded with no color Doppler signal or pulsed Doppler signal. 5/31 RUE venous duplex >>> no obvious DVT/SVT. 6/1 Right femoral endarterectomy, right femoral thrombectomy, four compartment fasciotomy - on vent post operatively 09/18/13 - extubated in SICU 09/19/13 -  extubated yesterday. Doing well. Going to OR for RLE wound closure per RN. Denies questions  09/20/13: PEA arrest last night with CPR, ROSC with return of normal mental status. Still pressor dependent. ALl device vascular access have clotted off. RUE swollen. Suspected PE. Poor prognosis per cards: patient indicated desire for extubation and  Possibly comfort care but wants to talk to family after extubation. Cards recommending comfort  LINES / TUBES: R IJ TLC 5/21 >>> 5/21 R IJ HD TLC 5/21 >>> L IJ HD percath 5/27 >>> Right Luiz Blare 5/28 >>> 6/1? ET tube ? Date > extubate 09/18/13, 09/19/13 (cardiac arrest) >> 09/20/13  CULTURES: None  ANTIBIOTICS:   Anti-infectives   Start     Dose/Rate Route Frequency Ordered Stop   09/18/13 1200  cefUROXime (ZINACEF) 1.5 g in dextrose 5 % 50 mL IVPB     1.5 g 100 mL/hr over 30 Minutes Intravenous Every 24 hours 09/17/13 1840 09/18/13 1200   09/18/13 0100  cefUROXime (ZINACEF) 1.5 g in dextrose 5 % 50 mL IVPB  Status:  Discontinued     1.5 g 100 mL/hr over 30 Minutes Intravenous Every 12 hours 09/17/13 1749 09/17/13 1840   09/17/13 1330  cefUROXime (ZINACEF) 1.5 g in dextrose 5 % 50 mL IVPB     1.5 g 100 mL/hr over 30 Minutes Intravenous To Surgery 09/17/13 1317 09/17/13 1348   09/12/13 0000  ceFAZolin (ANCEF) IVPB 1 g/50 mL premix    Comments:  Send with pt to OR   1 g 100 mL/hr over 30 Minutes Intravenous On call 09/11/13 1055 09/12/13 0100      SUBJECTIVE:   Constipated, requesting miralax Dobutamine started 6/6, milrinone  d/c'd  VITAL SIGNS: Temp:  [97.5 F (36.4 C)-98.9 F (37.2 C)] 97.5 F (36.4 C) (06/07 0400) Pulse Rate:  [54-129] 116 (06/07 0700) Resp:  [12-29] 13 (06/07 0700) BP: (68-144)/(13-79) 87/69 mmHg (06/07 0700) SpO2:  [84 %-100 %] 100 % (06/07 0700) Weight:  [132.8 kg (292 lb 12.3 oz)] 132.8 kg (292 lb 12.3 oz) (06/07 0700) HEMODYNAMICS: CVP:  [9 mmHg-28 mmHg] 22 mmHg VENTILATOR SETTINGS:   INTAKE /  OUTPUT: Intake/Output     06/06 0701 - 06/07 0700 06/07 0701 - 06/08 0700   P.O.     I.V. (mL/kg) 1373.1 (10.3)    Blood 324.5    Total Intake(mL/kg) 1697.6 (12.8)    Other 3012    Total Output 3012     Net -1314.4            PHYSICAL EXAMINATION:  Gen: no distress HEENT: NCAT, EOMi, strabismus noted PULM: CTA B CV: TAchy, regular, S1/S2 AB: BS+, soft, nontender Ext: warm, doppler R DP intact Neuro: A&Ox4, MAEW   LABS:  PULMONARY  Recent Labs Lab 09/17/13 1339 09/17/13 1615 09/17/13 1857  09/19/13 2116 09/20/13 1415 09/21/13 0415 09/22/13 0350 09/23/13 0410  PHART 7.389 7.392 7.354  --  7.288*  --   --   --   --   PCO2ART 38.9 42.2 31.2*  --  35.0  --   --   --   --   PO2ART 94.0 166.0* 107.0*  --  133.0*  --   --   --   --   HCO3 23.5 25.6* 17.4*  --  16.2*  --   --   --   --   TCO2 25 27 18   --  17.3  --   --   --   --   O2SAT 97.0 99.0 98.0  < > 98.5 63.6 64.6 55.4 65.2  < > = values in this interval not displayed.  CBC  Recent Labs Lab 09/21/13 0419 09/22/13 0351 09/23/13 0350  HGB 8.0* 7.5* 7.6*  HCT 25.3* 23.7* 24.1*  WBC 9.7 8.6 6.6  PLT 310 292 238    COAGULATION No results found for this basename: INR,  in the last 168 hours  CARDIAC    Recent Labs Lab 09/19/13 2032  TROPONINI <0.30   No results found for this basename: PROBNP,  in the last 168 hours   CHEMISTRY  Recent Labs Lab 09/20/13 0126  09/20/13 1343  09/21/13 0419 09/21/13 1527 09/22/13 0351 09/22/13 1543 09/23/13 0350  NA 128*  < >  --   < > 131* 130* 132* 133* 135*  K 5.5*  < >  --   < > 4.6 4.8 4.8 4.8 4.5  CL 89*  < >  --   < > 93* 93* 96 97 99  CO2 16*  < >  --   < > 22 24 23 24 25   GLUCOSE 412*  < >  --   < > 181* 296* 267* 279* 237*  BUN 38*  < >  --   < > 28* 24* 19 16 15   CREATININE 6.51*  < >  --   < > 4.52* 3.54* 2.90* 2.43* 2.39*  CALCIUM 8.6  < >  --   < > 7.6* 7.5* 7.5* 7.4* 7.5*  MG 2.0  --  2.0  --  2.1  --  2.3  --  2.5  PHOS 8.9*  8.9*  --    --   < >  4.6 3.3 3.0 2.4 2.4  < > = values in this interval not displayed. Estimated Creatinine Clearance: 42.6 ml/min (by C-G formula based on Cr of 2.39).   LIVER  Recent Labs Lab 09/17/13 0500  09/21/13 0419 09/21/13 1527 09/22/13 0351 09/22/13 1543 09/23/13 0350  AST 10  --   --   --   --   --  136*  ALT 6  --   --   --   --   --  289*  ALKPHOS 66  --   --   --   --   --  207*  BILITOT 0.3  --   --   --   --   --  0.5  PROT 4.8*  --   --   --   --   --  5.3*  ALBUMIN 1.8*  < > 1.6* 1.5* 1.6* 1.6* 1.6*  < > = values in this interval not displayed.   INFECTIOUS  Recent Labs Lab 09/19/13 2035  LATICACIDVEN 8.2*     ENDOCRINE CBG (last 3)   Recent Labs  09/22/13 2003 09/23/13 0019 09/23/13 0422  GLUCAP 271* 242* 222*      IMAGING x48h  No results found.    ASSESSMENT / PLAN:  PULMONARY A: VDRF resolved S/p LLL lobectomy secondary to [place] Former smoker Severe COPD - Gold IV (PFT's 5/20: FEV1 28% pre BD, 36% post BD)   -s/p extubation 09/18/13, reintubated 09/19/13 following PEA arrest, suspected PE and extubated 09/20/13. DOing well 09/23/13 on nasal cannula P:   -pulmonary toilet emphasize  - goal pulse ox > 88%  CARDIOVASCULAR A:  Cardiogenic Shock - now requiring levo and dobutamine. Acute on chronic systolic heart failure Multivessel CAD- CVTS consulted - not a candidate for CABG due to poor lung function & shock Acute pulmonary edema/ volume overload Multiple thromboses  Overall poor prognosis  P:  - Per cardiology  VASCULAR A: Right foot ischemia s/p  Right femoral endarterectomy, right femoral thrombectomy, four compartment fasciotomy Carotid stenosis - doppler 08/31/2013 shows R 60-79% ICA stenosis, L 80-99% ICA stenosis  - multiple clotting of vessels and catheters. - Argatroban started 09/19/13 to continue (started prior to code due to clotting of vessels)  P: - Vascular following RLE , guarded prognosis for RLE - argatroban    RENAL A:  ESRD - on CVVHD . Caths have clotted P:   - per renal  GASTROINTESTINAL A:   Obesity P:   - diet per primary team; can eat comfort foods - Pantoprazole for stress ulcer prophylaxis  HEMATOLOGIC A:   Anemia  - hgb  Is < 8gm%  P:  - Goal Hgb > 8 given cardiac hx but will check with cards first if goal should be > 8mg % still - SCD's. - transfuse 1 U PRBC  INFECTIOUS A:  No acute issues. P:   - Monitor for fever / leukocytosis.  ENDOCRINE A:   DM P:   - SSI.  NEUROLOGIC A:   Acute Encephalopathy    - normal mental status fter PEA 09/20/13  P:   --fent prn for pain  - avoid morphine, oxycodone in ICU setting with renal failure   Code: full Prognosis: poor  CC time 35 minutes  Roselie Awkward, MD [facility] Pager: [phone] Cell: [phone] If no response, call [phone]  09/23/2013 7:30 AM

## 2013-09-23 NOTE — Progress Notes (Addendum)
From 1400-1500 patient BP (Left Leg) has been SBP 50-60s, MAPS (30-40), patient is neuro intact, follows commands, denies CP, dizziness, SOB or any discomfort. Patient states she feels well.  Per clinical assessment of Patient, LEVO drip was not increased. Per previous report from MDs and RNs, BP on left leg extremity has not been accurate at times.  Patient is able to state when she is not well or when she feels like she is hypotensive.  During the entire hour, RN was present in the room to monitor patient for the entire time.  HR was 100-117, SpO2 > 95%, patient had pulses in all extremities.    Will monitor.   Note: Patient was sitting up the chair during this hour.

## 2013-09-23 NOTE — Progress Notes (Signed)
ADVANCED HEART FAILURE ROUNDING NOTE   SUBJECTIVE  43 yo morbidly obese female with PMH of DM1, stage V kidney disease, blindness, h/o lung CA s/p resection who was recently discharged for heart failure symptom had a high risk stress test which showed ischemia in anterior and apical region. She underwent cath 09/04/2013 which showed chronically occluded RCA and triple vessel dx. CT surgery consulted but not felt to be surgical candidate due to poor PFTs, suboptimal targets, comorbidities and shock. EF 22% by Myoview. 30-35% by echo  Underwent placement of swan and IABP last week for shock (co-ox 38%). Unfortunately developed cold foot and IABP had to be removed.Underwent R femoral thrombectomy and 4 compartment fasciotomy of RLE on 6/1.On 6/3 had VT/VF->PEA arrest 10 mins CPR. U/s shows R axillary DVT  Yesterday milrinone switched to dobutamine. Levophed weaned to 28 overnight but now back up to 40 due to hypotension. Back on CVVHD trying to pull gently. -1.3L yesterday.But weight continues to go up.  SBP as low as 59 this am. Feels weak. Getting 1u RBCs. Co-ox 65%    CURRENT MEDS . antiseptic oral rinse  15 mL Mouth Rinse BID  . aspirin  81 mg Oral Daily  . atorvastatin  80 mg Oral q1800  . calcitRIOL  0.25 mcg Oral Daily  . darbepoetin (ARANESP) injection - NON-DIALYSIS  200 mcg Subcutaneous Q Wed-1800  . insulin aspart  0-15 Units Subcutaneous TID WC  . insulin aspart  0-5 Units Subcutaneous QHS  . insulin glargine  25 Units Subcutaneous Q24H  . lidocaine (PF)  5 mL Other Once  . midodrine  2.5 mg Oral TID WC  . pantoprazole  40 mg Oral Daily  . polyethylene glycol  17 g Oral Daily    OBJECTIVE  Filed Vitals:   09/23/13 0845 09/23/13 0900 09/23/13 0915 09/23/13 0930  BP: 86/48 95/20 82/63  97/56  Pulse: 114 117    Temp:      TempSrc:      Resp: 14 17 19 13   Height:      Weight:      SpO2: 100% 97%      Intake/Output Summary (Last 24 hours) at 09/23/13 1034 Last data filed  at 09/23/13 1000  Gross per 24 hour  Intake 1648.3 ml  Output   3050 ml  Net -1401.7 ml   Filed Weights   09/21/13 0500 09/22/13 0500 09/23/13 0700  Weight: 129.6 kg (285 lb 11.5 oz) 131.1 kg (289 lb 0.4 oz) 132.8 kg (292 lb 12.3 oz)    PHYSICAL EXAM  General:Sitting up on side of bed.  NAD Neuro: Alert and oriented X 3. Moves all extremities spontaneous HEENT:  Normal  Neck: Supple without bruits. Chest L subclav PC. R subclav TLC. Unable to see JVD Lungs:  clear Heart: tachy regular. +s3 Abdomen: Soft, non-tender, non-distended, BS + x 4.  Extremities: 3+ edema. R foot  With dusky big toe. Strength improved DP non palpable but dopplerable. R arm swelling   CBC  Recent Labs  09/22/13 0351 09/23/13 0350  WBC 8.6 6.6  HGB 7.5* 7.6*  HCT 23.7* 24.1*  MCV 94.8 94.9  PLT 292 836   Basic Metabolic Panel  Recent Labs  09/22/13 0351 09/22/13 1543 09/23/13 0350  NA 132* 133* 135*  K 4.8 4.8 4.5  CL 96 97 99  CO2 23 24 25   GLUCOSE 267* 279* 237*  BUN 19 16 15   CREATININE 2.90* 2.43* 2.39*  CALCIUM 7.5* 7.4* 7.5*  MG  2.3  --  2.5  PHOS 3.0 2.4 2.4   Liver Function Tests  Recent Labs  09/22/13 1543 09/23/13 0350  AST  --  136*  ALT  --  289*  ALKPHOS  --  207*  BILITOT  --  0.5  PROT  --  5.3*  ALBUMIN 1.6* 1.6*   Cardiac Enzymes No results found for this basename: CKTOTAL, CKMB, CKMBINDEX, TROPONINI,  in the last 72 hours Thyroid Function Tests No results found for this basename: TSH, T4TOTAL, FREET3, T3FREE, THYROIDAB,  in the last 72 hours  TELE  Sinus tachycardia 120-130s   Radiology/Studies  Dg Chest 2 View  09/06/2013   CLINICAL DATA:  CHF, intermittent dyspnea  EXAM: CHEST  2 VIEW  COMPARISON:  Prior chest x-ray 08/13/2013; prior chest CT 08/22/2013  FINDINGS: Stable cardiac and mediastinal contours. Atherosclerotic calcifications are present within the transverse aorta. Moderate left and small right pleural effusions are similar compared to  prior. There is persistent associated bibasilar atelectasis. Mild pulmonary vascular congestion without overt edema. No pneumothorax. No new focal airspace consolidation. No acute osseous abnormality.  IMPRESSION: 1. Stable moderate left and small right pleural effusions and associated bibasilar atelectasis. 2. Pulmonary vascular congestion without overt edema.   Electronically Signed   By: Jacqulynn Cadet M.D.   On: 09/06/2013 07:55   Ct Chest Wo Contrast  08/22/2013   CLINICAL DATA:  Left lung cancer status post lobectomy. Weight gain. Renal insufficiency.  EXAM: CT CHEST WITHOUT CONTRAST  TECHNIQUE: Multidetector CT imaging of the chest was performed following the standard protocol without IV contrast.  COMPARISON:  Radiographs 08/13/2013 and 08/04/2013.  CT 08/02/2012.  FINDINGS: There are stable postsurgical changes related to prior left lower lobe resection. There has been interval enlargement of several mediastinal lymph nodes. These include 12 mm right paratracheal (image 19), 13 mm precarinal (image 23), and 14 mm AP window (image 25) lymph nodes. Some of these nodes have retained fatty hila. Allowing for the limitations of noncontrast technique, the hila appear stable.  There is stable low-density within the right thyroid lobe. Atherosclerosis of the aorta, great vessels and coronary arteries is noted. The heart size is normal. There is no pericardial effusion.  Moderate size dependent pleural effusions are present bilaterally. There is associated dependent airspace disease in both lower lobes. In addition, there are more focal airspace opacities within the right lower lobe and inferior aspect of the left upper lobe. The latter is somewhat nodular, measuring up to 1.8 cm on image 43. No other focal nodularity or endobronchial lesions are demonstrated.  The visualized upper abdomen has a stable appearance. There is no adrenal mass. There is increased subcutaneous edema throughout the subcutaneous fat,  especially within the anterior aspect of the upper abdomen.  No worrisome osseous findings are demonstrated.  IMPRESSION: 1. As demonstrated radiographically, there are bilateral pleural effusions and bilateral airspace opacities which are new compared with the prior CT. Although there are focal somewhat nodular components in the right lower and left upper lobes, these findings are most likely infectious/inflammatory. 2. Progressive mediastinal lymphadenopathy. Some of the nodes have retained fatty hila and may be reactive. Metastatic disease cannot be completely excluded. 3. Stable atherosclerosis and low-density within the right thyroid lobe. 4. If the airspace opacities and pleural effusions fail to respond to appropriate clinical therapy, follow-up CT or PET-CT may be warranted to exclude metastatic disease.   Electronically Signed   By: Camie Patience M.D.   On: 08/22/2013 10:26  Dg Chest Portable 1 View  08/13/2013   CLINICAL DATA:  Shortness of breath increasing for 2 weeks. History of CHF.  EXAM: PORTABLE CHEST - 1 VIEW  COMPARISON:  08/04/2013  FINDINGS: Again noted are bibasilar lung densities that are suggestive for pleural effusions and atelectasis. Heart size appears to be enlarged. There is some peribronchial thickening and cannot exclude pulmonary edema.  IMPRESSION: Bilateral pleural effusions with basilar atelectasis. There is mild pulmonary edema. Minimal change from the previous examination.   Electronically Signed   By: Markus Daft M.D.   On: 08/13/2013 23:10    ASSESSMENT AND PLAN 1. Cardiogenic shock 2. Acute on chronic systolic HF     EF ~10-17% 3. 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 Acute respiratory failure 5. 3-V CAD 6. Carotid stenosis     - doppler 08/31/2013 shows R 60-79% ICA stenosis, L 80-99% ICA stenosis     - vascular surgery aware 7. 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 DM1 9.  Ischemic R foot s/p R femoral thrombectomy and 4-compartment fasciotomy.  10. V/VF arrest 6/3 11. R axillary DVT  Continues to be very tenuous. Milrinone switched to dobutamine in an attempt to facilitate levophed wean but was unsuccessful. She remain with profound hypotension which is quite symptomatic and limits volume removal. Options are diminishingly small. Will try to add midodrine today and see if she can tolerate it from cardiac standpoint. Will continue to try to wean levophed slowly. Both her and her husband understand the predicament we are in.    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 Will continue. Eventual switch to warfarin. Will place lymphedema sleeve on R arm.   I think the chances of a meaningful survival continue to be very low but she reiterated her desire for aggressive care and full code status. We will go along with her wishes.    Appreciate renal's help with CVVHD. However volume status continues to worsen as volume removal limited by her hypotension. She is probably close to 40 pounds volume overloaded.  The patient is critically ill with multiple organ systems failure and requires high complexity decision making for assessment and support, frequent evaluation and titration of therapies, application of advanced monitoring technologies and extensive interpretation of multiple databases.   Critical Care Time devoted to patient care services described in this note is 45 Minutes.   Shaune Pascal Kendahl Bumgardner,MD 10:34 AM

## 2013-09-23 NOTE — Progress Notes (Signed)
At 1000, patient became extremely hypotensive, patient stated " I am dizzy, felt lightheaded", patient was pale, slightly clammy. Levophed drip was increased to 69mcg. After a few minutes, patient continued to be symptomatic, patient stated that "the feeling had not passed", Levo was increased to 1mcg, paged Dr. Haroldine Laws, MD came to bedside, At 1015 per MD increased Levo to 60mcg.  Will continue to monitor closely.

## 2013-09-23 NOTE — Procedures (Signed)
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 Developed RLE arterial clot in setting of IABP req R fem endarterectomy, thrombectomy and fasciotomy 09/17/13. Background DM1.  PEA/VF arrest 6/3, now pressor dep't and on CRRT.  Having mult throboses despite heparin now on argatroban.   Current CRRT Prescription: Start Date: restarted 09/20/13 Catheter: L IJ BFR: 200 Pre Blood Pump: 1000 DFR: 1500 Replacement Rate: 200 Goal UF: 70mL net negative Anticoagulation: argatroban systemic, clotted on heparin, some concern for HIT Clotting: not excessive  S: Changed over to dobutamine gtt, at 5 Still on levophed Pulling some fluid, net negative yesterdy 1.3L  O: 06/06 0701 - 06/07 0700 In: 1697.6 [I.V.:1373.1; Blood:324.5] Out: 3012   Filed Weights   09/21/13 0500 09/22/13 0500 09/23/13 0700  Weight: 129.6 kg (285 lb 11.5 oz) 131.1 kg (289 lb 0.4 oz) 132.8 kg (292 lb 12.3 oz)     Recent Labs Lab 09/22/13 0351 09/22/13 1543 09/23/13 0350  NA 132* 133* 135*  K 4.8 4.8 4.5  CL 96 97 99  CO2 23 24 25   GLUCOSE 267* 279* 237*  BUN 19 16 15   CREATININE 2.90* 2.43* 2.39*  CALCIUM 7.5* 7.4* 7.5*  PHOS 3.0 2.4 2.4    Recent Labs Lab 09/21/13 0419 09/22/13 0351 09/23/13 0350  WBC 9.7 8.6 6.6  HGB 8.0* 7.5* 7.6*  HCT 25.3* 23.7* 24.1*  MCV 93.0 94.8 94.9  PLT 310 292 238    ABG    Component Value Date/Time   PHART 7.288* 09/19/2013 2116   PCO2ART 35.0 09/19/2013 2116   PO2ART 133.0* 09/19/2013 2116   HCO3 16.2* 09/19/2013 2116   TCO2 17.3 09/19/2013 2116   ACIDBASEDEF 9.1* 09/19/2013 2116   O2SAT 65.2 09/23/2013 0410    Plan:  1. Dialysis dependent anuric AoCKD5, in cardiogenic shock req milrinone + NE: cont CRRT, pull fluid as permitted.  Good clearance.  No chagne to settings 2. Cardiogenic Shock, now on NE/dobutamine and dependent, ischemic and not a cabg  candidate 3. ? HITT, on argatroban 4. S/p R femoral endarterectomy, thrombectomy , and fasciotomy 5. CAD, ischemic CM  Pearson Grippe, MD [facility] pgr [phone]

## 2013-09-23 NOTE — Progress Notes (Signed)
ANTICOAGULATION CONSULT NOTE - Follow Up Consult  Pharmacy Consult:  Argatroban Indication:  Thromboembolic state  Allergies  Allergen Reactions  . Crestor [Rosuvastatin] Other (See Comments)    Severe muscle weakness  . Nsaids Other (See Comments)    Not allergic, "bad on my kidneys"  . Ciprofloxacin Rash    Patient Measurements: Height: 5' 7.5" (171.5 cm) Weight: 292 lb 12.3 oz (132.8 kg) IBW/kg (Calculated) : 62.75  Vital Signs: Temp: 99.1 F (37.3 C) (06/07 0741) Temp src: Oral (06/07 0741) BP: 97/56 mmHg (06/07 0930) Pulse Rate: 117 (06/07 0900)  Labs:  Recent Labs  09/21/13 0419  09/22/13 0351 09/22/13 1543 09/23/13 0350  HGB 8.0*  --  7.5*  --  7.6*  HCT 25.3*  --  23.7*  --  24.1*  PLT 310  --  292  --  238  APTT 80*  --  87*  --  82*  CREATININE 4.52*  < > 2.90* 2.43* 2.39*  < > = values in this interval not displayed.  Estimated Creatinine Clearance: 42.6 ml/min (by C-G formula based on Cr of 2.39).  Assessment: 65 YOF with right foot ischemia s/p femoral endarterectomy and thrombectomy on 09/17/13.  S/p fasciotomy closure on 09/19/13. HIT panel negative. Now on CVVHD.  Argatroban required lowering d/t high levels, now therapeutic x3 on 0.84mcg/kg/min- this morning's aPTT 82 seconds. Hgb low but stable, plts nml. No bleeding noted.  Goal of Therapy:  aPTT 50 - 90 seconds Monitor platelets by anticoagulation protocol: Yes   Plan 1. Continue Argatroban at 0.14mcg/kg/min 2. Daily aPTT 3. Follow for likely start of long term AC  Burrel Legrand D. Sonda Coppens, PharmD, BCPS Clinical Pharmacist Pager: [phone] 09/23/2013 10:55 AM

## 2013-09-24 LAB — GLUCOSE, CAPILLARY
GLUCOSE-CAPILLARY: 111 mg/dL — AB (ref 70–99)
GLUCOSE-CAPILLARY: 199 mg/dL — AB (ref 70–99)
GLUCOSE-CAPILLARY: 228 mg/dL — AB (ref 70–99)
GLUCOSE-CAPILLARY: 214 mg/dL — AB (ref 70–99)
Glucose-Capillary: 155 mg/dL — ABNORMAL HIGH (ref 70–99)
Glucose-Capillary: 161 mg/dL — ABNORMAL HIGH (ref 70–99)
Glucose-Capillary: 244 mg/dL — ABNORMAL HIGH (ref 70–99)

## 2013-09-24 LAB — RENAL FUNCTION PANEL
Albumin: 1.7 g/dL — ABNORMAL LOW (ref 3.5–5.2)
BUN: 13 mg/dL (ref 6–23)
CHLORIDE: 97 meq/L (ref 96–112)
CO2: 22 meq/L (ref 19–32)
CREATININE: 2 mg/dL — AB (ref 0.50–1.10)
Calcium: 7.7 mg/dL — ABNORMAL LOW (ref 8.4–10.5)
GFR calc Af Amer: 34 mL/min — ABNORMAL LOW (ref >90)
GFR, EST NON AFRICAN AMERICAN: 29 mL/min — AB (ref >90)
Glucose, Bld: 241 mg/dL — ABNORMAL HIGH (ref 70–99)
Phosphorus: 2 mg/dL — ABNORMAL LOW (ref 2.3–4.6)
Potassium: 4.8 mEq/L (ref 3.7–5.3)
Sodium: 133 mEq/L — ABNORMAL LOW (ref 137–147)

## 2013-09-24 LAB — CBC
HCT: 28.3 % — ABNORMAL LOW (ref 36.0–46.0)
Hemoglobin: 8.7 g/dL — ABNORMAL LOW (ref 12.0–15.0)
MCH: 28.9 pg (ref 26.0–34.0)
MCHC: 30.7 g/dL (ref 30.0–36.0)
MCV: 94 fL (ref 78.0–100.0)
Platelets: 275 10*3/uL (ref 150–400)
RBC: 3.01 MIL/uL — ABNORMAL LOW (ref 3.87–5.11)
RDW: 18.9 % — ABNORMAL HIGH (ref 11.5–15.5)
WBC: 8.6 10*3/uL (ref 4.0–10.5)

## 2013-09-24 LAB — BASIC METABOLIC PANEL
BUN: 12 mg/dL (ref 6–23)
CO2: 25 mEq/L (ref 19–32)
Calcium: 7.8 mg/dL — ABNORMAL LOW (ref 8.4–10.5)
Chloride: 98 mEq/L (ref 96–112)
Creatinine, Ser: 1.8 mg/dL — ABNORMAL HIGH (ref 0.50–1.10)
GFR calc Af Amer: 38 mL/min — ABNORMAL LOW (ref >90)
GFR calc non Af Amer: 33 mL/min — ABNORMAL LOW (ref >90)
Glucose, Bld: 171 mg/dL — ABNORMAL HIGH (ref 70–99)
Potassium: 4.5 mEq/L (ref 3.7–5.3)
Sodium: 134 mEq/L — ABNORMAL LOW (ref 137–147)

## 2013-09-24 LAB — CARBOXYHEMOGLOBIN
Carboxyhemoglobin: 2 % — ABNORMAL HIGH (ref 0.5–1.5)
Methemoglobin: 0.7 % (ref 0.0–1.5)
O2 Saturation: 52.6 %
Total hemoglobin: 9 g/dL — ABNORMAL LOW (ref 12.0–16.0)

## 2013-09-24 LAB — APTT: APTT: 80 s — AB (ref 24–37)

## 2013-09-24 LAB — PHOSPHORUS: Phosphorus: 2 mg/dL — ABNORMAL LOW (ref 2.3–4.6)

## 2013-09-24 LAB — MAGNESIUM: Magnesium: 2.3 mg/dL (ref 1.5–2.5)

## 2013-09-24 MED ORDER — ALTEPLASE 2 MG IJ SOLR
2.0000 mg | Freq: Once | INTRAMUSCULAR | Status: AC
  Administered 2013-09-24: 2 mg
  Filled 2013-09-24: qty 2

## 2013-09-24 MED ORDER — SODIUM CHLORIDE 0.9 % IJ SOLN
10.0000 mL | INTRAMUSCULAR | Status: DC | PRN
  Administered 2013-09-24: 10 mL
  Administered 2013-10-12: 40 mL

## 2013-09-24 MED ORDER — SORBITOL 70 % SOLN
30.0000 mL | Freq: Three times a day (TID) | Status: DC | PRN
  Administered 2013-09-24: 30 mL via ORAL
  Filled 2013-09-24: qty 30

## 2013-09-24 MED ORDER — SODIUM CHLORIDE 0.9 % IJ SOLN
10.0000 mL | Freq: Two times a day (BID) | INTRAMUSCULAR | Status: DC
Start: 2013-09-24 — End: 2013-10-04
  Administered 2013-09-24 – 2013-10-03 (×15): 10 mL

## 2013-09-24 MED ORDER — AMIODARONE HCL 200 MG PO TABS
400.0000 mg | ORAL_TABLET | Freq: Two times a day (BID) | ORAL | Status: DC
  Administered 2013-09-24 – 2013-09-28 (×9): 400 mg via ORAL
  Filled 2013-09-24 (×10): qty 2

## 2013-09-24 NOTE — Progress Notes (Signed)
LAC PIV is out of date, Site is clean, intact, and dry. No discomfort stated by patient, MD aware, will continue using IV until another site can be restarted.  Due to limited vascular access, will leave IV in to infuse Argatroban drip.

## 2013-09-24 NOTE — Progress Notes (Signed)
ANTICOAGULATION CONSULT NOTE - Follow Up Consult  Pharmacy Consult:  Argatroban Indication:  Thromboembolic state  Allergies  Allergen Reactions  . Crestor [Rosuvastatin] Other (See Comments)    Severe muscle weakness  . Nsaids Other (See Comments)    Not allergic, "bad on my kidneys"  . Ciprofloxacin Rash    Patient Measurements: Height: 5' 7.5" (171.5 cm) Weight: 296 lb 8.3 oz (134.5 kg) IBW/kg (Calculated) : 62.75  Vital Signs: Temp: 97.7 F (36.5 C) (06/08 0800) Temp src: Oral (06/08 0800) BP: 78/51 mmHg (06/08 1015) Pulse Rate: 108 (06/08 1030)  Labs:  Recent Labs  09/22/13 0351  09/23/13 0350 09/23/13 1600 09/24/13 0400  HGB 7.5*  --  7.6*  --  8.7*  HCT 23.7*  --  24.1*  --  28.3*  PLT 292  --  238  --  275  APTT 87*  --  82*  --  80*  CREATININE 2.90*  < > 2.39* 2.10* 1.80*  < > = values in this interval not displayed.  Estimated Creatinine Clearance: 57 ml/min (by C-G formula based on Cr of 1.8).  Assessment: 63 YOF with right foot ischemia s/p femoral endarterectomy and thrombectomy on 09/17/13.  S/p fasciotomy closure on 09/19/13. HIT panel negative. Now on CVVHD.  Argatroban required lowering d/t high levels, now therapeutic x 4 on 0.45mcg/kg/min- this morning's aPTT 80 seconds. Hgb low but stable, plts nml. No bleeding noted.  Goal of Therapy:  aPTT 50 - 90 seconds Monitor platelets by anticoagulation protocol: Yes   Plan 1. Continue Argatroban at 0.44mcg/kg/min 2. Daily aPTT 3. Follow for likely start of long term AC  Uvaldo Rising, BCPS  Clinical Pharmacist Pager [phone]  09/24/2013 11:51 AM

## 2013-09-24 NOTE — Progress Notes (Addendum)
  [facility] Progress Note  09/24/2013 10:56 AM 5 Days Post-Op  Subjective:  Patient states numbness in right foot has slightly improved. Per RN, serous drainage from fasciotomy closure sites.   Tmax 98.3 BP sys 50s-120s 02 100% RA  Filed Vitals:   09/24/13 1030  BP:   Pulse: 108  Temp:   Resp: 13    Physical Exam: Incisions:  Right groin incision with active bloody drainage and surrounding ecchymosis. Very tender to touch. Right leg fasciotomy staple lines with slight erythema. No active drainage.  Extremities:  DP doppler signals bilaterally.   CBC    Component Value Date/Time   WBC 8.6 09/24/2013 0400   RBC 3.01* 09/24/2013 0400   HGB 8.7* 09/24/2013 0400   HCT 28.3* 09/24/2013 0400   PLT 275 09/24/2013 0400   MCV 94.0 09/24/2013 0400   MCH 28.9 09/24/2013 0400   MCHC 30.7 09/24/2013 0400   RDW 18.9* 09/24/2013 0400   LYMPHSABS 1.4 08/04/2013 1223   MONOABS 0.4 08/04/2013 1223   EOSABS 0.2 08/04/2013 1223   BASOSABS 0.1 08/04/2013 1223    BMET    Component Value Date/Time   NA 134* 09/24/2013 0400   K 4.5 09/24/2013 0400   CL 98 09/24/2013 0400   CO2 25 09/24/2013 0400   GLUCOSE 171* 09/24/2013 0400   BUN 12 09/24/2013 0400   CREATININE 1.80* 09/24/2013 0400   CREATININE 4.17* 07/06/2013 1618   CALCIUM 7.8* 09/24/2013 0400   GFRNONAA 33* 09/24/2013 0400   GFRNONAA 71 07/13/2011 0835   GFRAA 38* 09/24/2013 0400   GFRAA 82 07/13/2011 0835    INR    Component Value Date/Time   INR 1.13 09/06/2013 0740     Intake/Output Summary (Last 24 hours) at 09/24/13 1056 Last data filed at 09/24/13 1000  Gross per 24 hour  Intake 1365.6 ml  Output   2481 ml  Net -1115.4 ml     Assessment:  43 y.o. female is s/p:  Right femoral endarterectomy, right femoral thrombectomy, four compartment fasciotomy  5 Days Post-Op  Plan: -Pt continuing to have doppler signals bilaterally. No acute ischemic changes.  -Will continue to monitor incisions. Patient getting out of bed yesterday  may have caused stress to groin incision. Keep area dry. Do not think it is infected. Will discuss with Dr. Oneida Alar.  -DVT prophylaxis:  Argatroban    Virgina Jock, PA-C [facility] Office: [phone] Pager: [phone] 09/24/2013 10:56 AM   Some sero sanguinous drainage and early skin necrosis right groin Continue local wound care with dry dressings for now. May need wound opened some and VAC eventually depending on overall plan of care  Ruta Hinds, MD [facility] [place] Office: [phone] Pager: [phone]

## 2013-09-24 NOTE — Progress Notes (Signed)
  Assessment:  1. Dialysis dependent anuric AoCKD5, in cardiogenic shock req milrinone + NE: cont CRRT, pull fluid as permitted. Good clearance. Adjust prefilter fluid 2. Cardiogenic Shock, now on NE/dobutamine, milrinone, midodrine; ischemic and not a cabg candidate 3. ? HITT, on argatroban 4. S/p R femoral endarterectomy, thrombectomy , and fasciotomy 5. CAD, ischemic CM  Subjective: Interval History: Tolerating CVVHD  Objective: Vital signs in last 24 hours: Temp:  [97.6 F (36.4 C)-98.3 F (36.8 C)] 97.7 F (36.5 C) (06/08 0800) Pulse Rate:  [106-130] 108 (06/08 1030) Resp:  [10-28] 13 (06/08 1030) BP: (50-126)/(12-85) 78/51 mmHg (06/08 1015) SpO2:  [86 %-100 %] 96 % (06/08 1030) Weight:  [134.5 kg (296 lb 8.3 oz)] 134.5 kg (296 lb 8.3 oz) (06/08 0600) Weight change: 1.7 kg (3 lb 12 oz)  Intake/Output from previous day: 06/07 0701 - 06/08 0700 In: 1728.8 [I.V.:1391.3; Blood:337.5] Out: 2462  Intake/Output this shift: Total I/O In: -  Out: 452 [Other:452]  General appearance: alert and cooperative Resp: clear to auscultation bilaterally Cardio: regular rate and rhythm, S1, S2 normal, no murmur, click, rub or gallop Extremities: edema 2-3+  Lab Results:  Recent Labs  09/23/13 0350 09/24/13 0400  WBC 6.6 8.6  HGB 7.6* 8.7*  HCT 24.1* 28.3*  PLT 238 275   BMET:  Recent Labs  09/23/13 1600 09/24/13 0400  NA 132* 134*  K 4.6 4.5  CL 96 98  CO2 24 25  GLUCOSE 252* 171*  BUN 14 12  CREATININE 2.10* 1.80*  CALCIUM 7.7* 7.8*   No results found for this basename: PTH,  in the last 72 hours Iron Studies: No results found for this basename: IRON, TIBC, TRANSFERRIN, FERRITIN,  in the last 72 hours Studies/Results: No results found.  Scheduled: . amiodarone  400 mg Oral BID  . aspirin  81 mg Oral Daily  . atorvastatin  80 mg Oral q1800  . calcitRIOL  0.25 mcg Oral Daily  . darbepoetin (ARANESP) injection - NON-DIALYSIS  200 mcg Subcutaneous Q Wed-1800  .  insulin aspart  0-15 Units Subcutaneous TID WC  . insulin aspart  0-5 Units Subcutaneous QHS  . insulin glargine  25 Units Subcutaneous Q24H  . lidocaine (PF)  5 mL Other Once  . midodrine  2.5 mg Oral TID WC  . pantoprazole  40 mg Oral Daily  . polyethylene glycol  17 g Oral Daily    LOS: 21 days   Estanislado Emms 09/24/2013,11:12 AM

## 2013-09-24 NOTE — Progress Notes (Signed)
Resp status stable. Comfortable on RA. All HD mgmt per Cards. Prognosis remains poor but PCCM is adding little @ this time. Please call if we can be of further assistance  No charge for this encounter  Merton Border, MD ; [facility] service Mobile [phone].  After 5:30 PM or weekends, call [phone]

## 2013-09-24 NOTE — Progress Notes (Signed)
UR Completed.  Vergie Living [phone] 09/24/2013

## 2013-09-24 NOTE — Progress Notes (Signed)
ADVANCED HEART FAILURE ROUNDING NOTE   SUBJECTIVE  43 yo morbidly obese female with PMH of DM1, stage V kidney disease, blindness, h/o lung CA s/p resection who was recently discharged for heart failure symptom had a high risk stress test which showed ischemia in anterior and apical region. She underwent cath 09/04/2013 which showed chronically occluded RCA and triple vessel dx. CT surgery consulted but not felt to be surgical candidate due to poor PFTs, suboptimal targets, comorbidities and shock. EF 22% by Myoview. 30-35% by echo  Underwent placement of swan and IABP last week for shock (co-ox 38%). Unfortunately developed cold foot and IABP had to be removed.Underwent R femoral thrombectomy and 4 compartment fasciotomy of RLE on 6/1.On 6/3 had VT/VF->PEA arrest 10 mins CPR. U/s shows R axillary DVT  Now on levophed 34/dobutamine 5. Midodrine added yesterday to help with BP. Still with periods of hypotension which are limiting ability to dialyze. Weight now up 40 pounds since 5/27 despite CVVHD. C/o constipation.  Co-ox 53% this am.     CURRENT MEDS . aspirin  81 mg Oral Daily  . atorvastatin  80 mg Oral q1800  . calcitRIOL  0.25 mcg Oral Daily  . darbepoetin (ARANESP) injection - NON-DIALYSIS  200 mcg Subcutaneous Q Wed-1800  . insulin aspart  0-15 Units Subcutaneous TID WC  . insulin aspart  0-5 Units Subcutaneous QHS  . insulin glargine  25 Units Subcutaneous Q24H  . lidocaine (PF)  5 mL Other Once  . midodrine  2.5 mg Oral TID WC  . pantoprazole  40 mg Oral Daily  . polyethylene glycol  17 g Oral Daily    OBJECTIVE  Filed Vitals:   09/24/13 0730 09/24/13 0745 09/24/13 0800 09/24/13 0815  BP: 101/64 104/70 101/70 87/15  Pulse:   112   Temp:   97.7 F (36.5 C)   TempSrc:   Oral   Resp:      Height:      Weight:      SpO2:   99%     Intake/Output Summary (Last 24 hours) at 09/24/13 0830 Last data filed at 09/24/13 0800  Gross per 24 hour  Intake 1669.2 ml  Output    2487 ml  Net -817.8 ml   Filed Weights   09/22/13 0500 09/23/13 0700 09/24/13 0600  Weight: 131.1 kg (289 lb 0.4 oz) 132.8 kg (292 lb 12.3 oz) 134.5 kg (296 lb 8.3 oz)    PHYSICAL EXAM  General:Sitting up on side of bed.  NAD Neuro: Alert and oriented X 3. Moves all extremities spontaneous HEENT:  Normal  Neck: Supple without bruits. Chest L subclav PC. R subclav TLC. Unable to see JVD Lungs:  clear Heart: tachy regular. +s3 Abdomen: Soft, non-tender, non-distended, BS + x 4.  Extremities: 3+ edema. R foot  With dusky big toe. Strength improved DP non palpable but dopplerable. R arm swelling   CBC  Recent Labs  09/23/13 0350 09/24/13 0400  WBC 6.6 8.6  HGB 7.6* 8.7*  HCT 24.1* 28.3*  MCV 94.9 94.0  PLT 238 967   Basic Metabolic Panel  Recent Labs  09/23/13 0350 09/23/13 1600 09/24/13 0400  NA 135* 132* 134*  K 4.5 4.6 4.5  CL 99 96 98  CO2 25 24 25   GLUCOSE 237* 252* 171*  BUN 15 14 12   CREATININE 2.39* 2.10* 1.80*  CALCIUM 7.5* 7.7* 7.8*  MG 2.5  --  2.3  PHOS 2.4 2.2* 2.0*   Liver Function Tests  Recent Labs  09/23/13 0350 09/23/13 1600  AST 136*  --   ALT 289*  --   ALKPHOS 207*  --   BILITOT 0.5  --   PROT 5.3*  --   ALBUMIN 1.6* 1.7*   Cardiac Enzymes No results found for this basename: CKTOTAL, CKMB, CKMBINDEX, TROPONINI,  in the last 72 hours Thyroid Function Tests No results found for this basename: TSH, T4TOTAL, FREET3, T3FREE, THYROIDAB,  in the last 72 hours  TELE  Sinus tachycardia 120-130s   Radiology/Studies  Dg Chest 2 View  09/06/2013   CLINICAL DATA:  CHF, intermittent dyspnea  EXAM: CHEST  2 VIEW  COMPARISON:  Prior chest x-ray 08/13/2013; prior chest CT 08/22/2013  FINDINGS: Stable cardiac and mediastinal contours. Atherosclerotic calcifications are present within the transverse aorta. Moderate left and small right pleural effusions are similar compared to prior. There is persistent associated bibasilar atelectasis. Mild  pulmonary vascular congestion without overt edema. No pneumothorax. No new focal airspace consolidation. No acute osseous abnormality.  IMPRESSION: 1. Stable moderate left and small right pleural effusions and associated bibasilar atelectasis. 2. Pulmonary vascular congestion without overt edema.   Electronically Signed   By: Jacqulynn Cadet M.D.   On: 09/06/2013 07:55   Ct Chest Wo Contrast  08/22/2013   CLINICAL DATA:  Left lung cancer status post lobectomy. Weight gain. Renal insufficiency.  EXAM: CT CHEST WITHOUT CONTRAST  TECHNIQUE: Multidetector CT imaging of the chest was performed following the standard protocol without IV contrast.  COMPARISON:  Radiographs 08/13/2013 and 08/04/2013.  CT 08/02/2012.  FINDINGS: There are stable postsurgical changes related to prior left lower lobe resection. There has been interval enlargement of several mediastinal lymph nodes. These include 12 mm right paratracheal (image 19), 13 mm precarinal (image 23), and 14 mm AP window (image 25) lymph nodes. Some of these nodes have retained fatty hila. Allowing for the limitations of noncontrast technique, the hila appear stable.  There is stable low-density within the right thyroid lobe. Atherosclerosis of the aorta, great vessels and coronary arteries is noted. The heart size is normal. There is no pericardial effusion.  Moderate size dependent pleural effusions are present bilaterally. There is associated dependent airspace disease in both lower lobes. In addition, there are more focal airspace opacities within the right lower lobe and inferior aspect of the left upper lobe. The latter is somewhat nodular, measuring up to 1.8 cm on image 43. No other focal nodularity or endobronchial lesions are demonstrated.  The visualized upper abdomen has a stable appearance. There is no adrenal mass. There is increased subcutaneous edema throughout the subcutaneous fat, especially within the anterior aspect of the upper abdomen.  No  worrisome osseous findings are demonstrated.  IMPRESSION: 1. As demonstrated radiographically, there are bilateral pleural effusions and bilateral airspace opacities which are new compared with the prior CT. Although there are focal somewhat nodular components in the right lower and left upper lobes, these findings are most likely infectious/inflammatory. 2. Progressive mediastinal lymphadenopathy. Some of the nodes have retained fatty hila and may be reactive. Metastatic disease cannot be completely excluded. 3. Stable atherosclerosis and low-density within the right thyroid lobe. 4. If the airspace opacities and pleural effusions fail to respond to appropriate clinical therapy, follow-up CT or PET-CT may be warranted to exclude metastatic disease.   Electronically Signed   By: Camie Patience M.D.   On: 08/22/2013 10:26   Dg Chest Portable 1 View  08/13/2013   CLINICAL DATA:  Shortness of  breath increasing for 2 weeks. History of CHF.  EXAM: PORTABLE CHEST - 1 VIEW  COMPARISON:  08/04/2013  FINDINGS: Again noted are bibasilar lung densities that are suggestive for pleural effusions and atelectasis. Heart size appears to be enlarged. There is some peribronchial thickening and cannot exclude pulmonary edema.  IMPRESSION: Bilateral pleural effusions with basilar atelectasis. There is mild pulmonary edema. Minimal change from the previous examination.   Electronically Signed   By: Markus Daft M.D.   On: 08/13/2013 23:10    ASSESSMENT AND PLAN 1. Cardiogenic shock 2. Acute on chronic systolic HF     EF ~55-97% 3. 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 Acute respiratory failure 5. 3-V CAD 6. Carotid stenosis     - doppler 08/31/2013 shows R 60-79% ICA stenosis, L 80-99% ICA stenosis     - vascular surgery aware 7. 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 DM1 9. Ischemic R foot s/p R femoral thrombectomy and 4-compartment  fasciotomy.  10. V/VF arrest 6/3 11. R axillary DVT 12. Severe protein calorie malnutrition   Continues to be very tenuous. Milrinone switched to dobutamine in an attempt to facilitate levophed wean but was unsuccessful. Midodrine added yesterday. Still with periods of hypotension limiting HD and co-ox now falling. Weight now up 40 pounds in 2 weeks. I discussed with her the fact that she continues to deteriorate despite very aggressive support and we have essentially no options left. I also told her that she will likely develop recurrent respiratory distress and may require intubation if we can not pull any fluid off. I will talk to Dr. Florene Glen (Renal) today to see if he has any other suggestions but feel like we are closing in on a palliative situation if we are not there already. She has asked me to come back later today to discuss further with her husband. I will come back after Clinic.   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 Will continue. Eventual switch to warfarin. Will place lymphedema sleeve on R arm. Sorbitol for constipation.   The patient is critically ill with multiple organ systems failure and requires high complexity decision making for assessment and support, frequent evaluation and titration of therapies, application of advanced monitoring technologies and extensive interpretation of multiple databases.   Critical Care Time devoted to patient care services described in this note is 45 Minutes.   Kyoko Elsea R Siboney Requejo,MD 8:30 AM

## 2013-09-25 LAB — CBC
HCT: 29.2 % — ABNORMAL LOW (ref 36.0–46.0)
Hemoglobin: 8.8 g/dL — ABNORMAL LOW (ref 12.0–15.0)
MCH: 28.6 pg (ref 26.0–34.0)
MCHC: 30.1 g/dL (ref 30.0–36.0)
MCV: 94.8 fL (ref 78.0–100.0)
PLATELETS: 275 10*3/uL (ref 150–400)
RBC: 3.08 MIL/uL — AB (ref 3.87–5.11)
RDW: 18.8 % — ABNORMAL HIGH (ref 11.5–15.5)
WBC: 8.6 10*3/uL (ref 4.0–10.5)

## 2013-09-25 LAB — RENAL FUNCTION PANEL
ALBUMIN: 1.7 g/dL — AB (ref 3.5–5.2)
Albumin: 1.7 g/dL — ABNORMAL LOW (ref 3.5–5.2)
BUN: 12 mg/dL (ref 6–23)
BUN: 12 mg/dL (ref 6–23)
CALCIUM: 7.9 mg/dL — AB (ref 8.4–10.5)
CO2: 25 meq/L (ref 19–32)
CO2: 26 mEq/L (ref 19–32)
Calcium: 7.9 mg/dL — ABNORMAL LOW (ref 8.4–10.5)
Chloride: 95 mEq/L — ABNORMAL LOW (ref 96–112)
Chloride: 98 mEq/L (ref 96–112)
Creatinine, Ser: 2.16 mg/dL — ABNORMAL HIGH (ref 0.50–1.10)
Creatinine, Ser: 2.04 mg/dL — ABNORMAL HIGH (ref 0.50–1.10)
GFR calc Af Amer: 31 mL/min — ABNORMAL LOW (ref >90)
GFR calc Af Amer: 33 mL/min — ABNORMAL LOW (ref >90)
GFR calc non Af Amer: 26 mL/min — ABNORMAL LOW (ref >90)
GFR calc non Af Amer: 28 mL/min — ABNORMAL LOW (ref >90)
GLUCOSE: 167 mg/dL — AB (ref 70–99)
GLUCOSE: 185 mg/dL — AB (ref 70–99)
PHOSPHORUS: 2 mg/dL — AB (ref 2.3–4.6)
POTASSIUM: 4.4 meq/L (ref 3.7–5.3)
Phosphorus: 2.1 mg/dL — ABNORMAL LOW (ref 2.3–4.6)
Potassium: 4.4 mEq/L (ref 3.7–5.3)
Sodium: 133 mEq/L — ABNORMAL LOW (ref 137–147)
Sodium: 135 mEq/L — ABNORMAL LOW (ref 137–147)

## 2013-09-25 LAB — GLUCOSE, CAPILLARY
GLUCOSE-CAPILLARY: 163 mg/dL — AB (ref 70–99)
GLUCOSE-CAPILLARY: 168 mg/dL — AB (ref 70–99)
Glucose-Capillary: 142 mg/dL — ABNORMAL HIGH (ref 70–99)
Glucose-Capillary: 191 mg/dL — ABNORMAL HIGH (ref 70–99)

## 2013-09-25 LAB — APTT: APTT: 86 s — AB (ref 24–37)

## 2013-09-25 LAB — CARBOXYHEMOGLOBIN
Carboxyhemoglobin: 1.9 % — ABNORMAL HIGH (ref 0.5–1.5)
Methemoglobin: 0.6 % (ref 0.0–1.5)
O2 SAT: 61.7 %
Total hemoglobin: 8.7 g/dL — ABNORMAL LOW (ref 12.0–16.0)

## 2013-09-25 LAB — PREPARE RBC (CROSSMATCH)

## 2013-09-25 LAB — PROTIME-INR
INR: 2.4 — ABNORMAL HIGH (ref 0.00–1.49)
PROTHROMBIN TIME: 25.4 s — AB (ref 11.6–15.2)

## 2013-09-25 LAB — MAGNESIUM: MAGNESIUM: 2.5 mg/dL (ref 1.5–2.5)

## 2013-09-25 MED ORDER — MIDODRINE HCL 5 MG PO TABS
5.0000 mg | ORAL_TABLET | Freq: Three times a day (TID) | ORAL | Status: DC
  Administered 2013-09-25 – 2013-09-26 (×3): 5 mg via ORAL
  Filled 2013-09-25 (×6): qty 1

## 2013-09-25 MED ORDER — WARFARIN - PHARMACIST DOSING INPATIENT: Freq: Every day | Status: DC

## 2013-09-25 MED ORDER — CALCITRIOL 0.25 MCG PO CAPS
0.2500 ug | ORAL_CAPSULE | Freq: Every day | ORAL | Status: DC
  Administered 2013-09-25 – 2013-10-29 (×35): 0.25 ug via ORAL
  Filled 2013-09-25 (×35): qty 1

## 2013-09-25 MED ORDER — WARFARIN SODIUM 5 MG PO TABS
5.0000 mg | ORAL_TABLET | Freq: Once | ORAL | Status: AC
  Administered 2013-09-25: 5 mg via ORAL
  Filled 2013-09-25: qty 1

## 2013-09-25 NOTE — Progress Notes (Addendum)
ANTICOAGULATION CONSULT NOTE - Follow Up Consult  Pharmacy Consult:  Argatroban, add Coumadin today Indication:  Thromboembolic state  Allergies  Allergen Reactions  . Crestor [Rosuvastatin] Other (See Comments)    Severe muscle weakness  . Nsaids Other (See Comments)    Not allergic, "bad on my kidneys"  . Ciprofloxacin Rash    Patient Measurements: Height: 5' 7.5" (171.5 cm) Weight: 294 lb 5 oz (133.5 kg) IBW/kg (Calculated) : 62.75  Vital Signs: Temp: 97.8 F (36.6 C) (06/09 1415) Temp src: Oral (06/09 1415) BP: 120/62 mmHg (06/09 1430) Pulse Rate: 112 (06/09 1445)  Labs:  Recent Labs  09/23/13 0350  09/24/13 0400 09/24/13 1600 09/25/13 0448 09/25/13 1308  HGB 7.6*  --  8.7*  --  8.8*  --   HCT 24.1*  --  28.3*  --  29.2*  --   PLT 238  --  275  --  275  --   APTT 82*  --  80*  --  86*  --   LABPROT  --   --   --   --   --  25.4*  INR  --   --   --   --   --  2.40*  CREATININE 2.39*  < > 1.80* 2.00* 2.16*  --   < > = values in this interval not displayed.  Estimated Creatinine Clearance: 47.3 ml/min (by C-G formula based on Cr of 2.16).  Assessment: 18 YOF with right foot ischemia s/p femoral endarterectomy and thrombectomy on 09/17/13.  S/p fasciotomy closure on 09/19/13. HIT panel negative. Now on CVVHD.  Argatroban required lowering d/t high levels, now therapeutic x 5 on 0.21mcg/kg/min- this morning's aPTT 86 seconds. Hgb low but stable, plts nml. No bleeding noted. "Baseline" INR 2.4, but falsely elevated due to argatroban.  Will need 5 day overlap with warfarin and argatroban, and argatroban can be held when INR >= 4 for 24 hrs.  Goal of Therapy:  aPTT 50 - 90 seconds Monitor platelets by anticoagulation protocol: Yes   Plan 1. Continue Argatroban at 0.4 mcg/kg/min 2. Daily aPTT 3. Start Coumadin tonight with 5 mg x 1. 4. Daily PT/INR - but will be falsely elevated on argatroban. 5. Defer education for now until prognosis more clear.  Uvaldo Rising, BCPS  Clinical Pharmacist Pager [phone]  09/25/2013 2:57 PM

## 2013-09-25 NOTE — Progress Notes (Signed)
ADVANCED HEART FAILURE ROUNDING NOTE   SUBJECTIVE  43 yo morbidly obese female with PMH of DM1, stage V kidney disease, blindness, h/o lung CA s/p resection who was recently discharged for heart failure symptom had a high risk stress test which showed ischemia in anterior and apical region. She underwent cath 09/04/2013 which showed chronically occluded RCA and triple vessel dx. CT surgery consulted but not felt to be surgical candidate due to poor PFTs, suboptimal targets, comorbidities and shock. EF 22% by Myoview. 30-35% by echo  Underwent placement of swan and IABP last week for shock (co-ox 38%). Unfortunately developed cold foot and IABP had to be removed.Underwent R femoral thrombectomy and 4 compartment fasciotomy of RLE on 6/1.On 6/3 had VT/VF->PEA arrest 10 mins CPR. U/s shows R axillary DVT  Now on levophed 35 dobutamine 5. Midodrine 2.5 tid. BP still soft.. Able to pull 2 pounds of fluid with CVVHD yesterday. Weight up 38 pounds since 5/27 despite CVVHD. C/o constipation. HR improved  Co-ox 61% this am.     CURRENT MEDS . amiodarone  400 mg Oral BID  . aspirin  81 mg Oral Daily  . atorvastatin  80 mg Oral q1800  . calcitRIOL  0.25 mcg Oral Daily  . darbepoetin (ARANESP) injection - NON-DIALYSIS  200 mcg Subcutaneous Q Wed-1800  . insulin aspart  0-15 Units Subcutaneous TID WC  . insulin aspart  0-5 Units Subcutaneous QHS  . insulin glargine  25 Units Subcutaneous Q24H  . lidocaine (PF)  5 mL Other Once  . midodrine  2.5 mg Oral TID WC  . pantoprazole  40 mg Oral Daily  . polyethylene glycol  17 g Oral Daily  . sodium chloride  10-40 mL Intracatheter Q12H    OBJECTIVE  Filed Vitals:   09/25/13 0915 09/25/13 0930 09/25/13 0945 09/25/13 1000  BP: 119/88 101/58 89/62 82/52   Pulse:    104  Temp:      TempSrc:      Resp: 17 15 16 11   Height:      Weight:      SpO2:        Intake/Output Summary (Last 24 hours) at 09/25/13 1008 Last data filed at 09/25/13 1000   Gross per 24 hour  Intake 1258.9 ml  Output   2795 ml  Net -1536.1 ml   Filed Weights   09/23/13 0700 09/24/13 0600 09/25/13 0500  Weight: 132.8 kg (292 lb 12.3 oz) 134.5 kg (296 lb 8.3 oz) 133.5 kg (294 lb 5 oz)    PHYSICAL EXAM  General:Lying in bed.  NAD Neuro: Alert and oriented X 3. Moves all extremities spontaneous HEENT:  Normal  Neck: Supple without bruits. Chest L subclav PC. R subclav TLC. Unable to see JVD Lungs:  clear Heart: tachy regular. +s3 Abdomen: Soft, non-tender, non-distended, BS + x 4.  Extremities: 3+ edema. R foot. Wamr Strength improved DP non palpable but dopplerable. R arm swelling   CBC  Recent Labs  09/24/13 0400 09/25/13 0448  WBC 8.6 8.6  HGB 8.7* 8.8*  HCT 28.3* 29.2*  MCV 94.0 94.8  PLT 275 458   Basic Metabolic Panel  Recent Labs  09/24/13 0400 09/24/13 1600 09/25/13 0448  NA 134* 133* 133*  K 4.5 4.8 4.4  CL 98 97 95*  CO2 25 22 25   GLUCOSE 171* 241* 167*  BUN 12 13 12   CREATININE 1.80* 2.00* 2.16*  CALCIUM 7.8* 7.7* 7.9*  MG 2.3  --  2.5  PHOS 2.0* 2.0*  2.1*   Liver Function Tests  Recent Labs  09/23/13 0350  09/24/13 1600 09/25/13 0448  AST 136*  --   --   --   ALT 289*  --   --   --   ALKPHOS 207*  --   --   --   BILITOT 0.5  --   --   --   PROT 5.3*  --   --   --   ALBUMIN 1.6*  < > 1.7* 1.7*  < > = values in this interval not displayed. Cardiac Enzymes No results found for this basename: CKTOTAL, CKMB, CKMBINDEX, TROPONINI,  in the last 72 hours Thyroid Function Tests No results found for this basename: TSH, T4TOTAL, FREET3, T3FREE, THYROIDAB,  in the last 72 hours  TELE  Sinus tachycardia 100-110   Radiology/Studies  Dg Chest 2 View  09/06/2013   CLINICAL DATA:  CHF, intermittent dyspnea  EXAM: CHEST  2 VIEW  COMPARISON:  Prior chest x-ray 08/13/2013; prior chest CT 08/22/2013  FINDINGS: Stable cardiac and mediastinal contours. Atherosclerotic calcifications are present within the transverse aorta.  Moderate left and small right pleural effusions are similar compared to prior. There is persistent associated bibasilar atelectasis. Mild pulmonary vascular congestion without overt edema. No pneumothorax. No new focal airspace consolidation. No acute osseous abnormality.  IMPRESSION: 1. Stable moderate left and small right pleural effusions and associated bibasilar atelectasis. 2. Pulmonary vascular congestion without overt edema.   Electronically Signed   By: Jacqulynn Cadet M.D.   On: 09/06/2013 07:55   Ct Chest Wo Contrast  08/22/2013   CLINICAL DATA:  Left lung cancer status post lobectomy. Weight gain. Renal insufficiency.  EXAM: CT CHEST WITHOUT CONTRAST  TECHNIQUE: Multidetector CT imaging of the chest was performed following the standard protocol without IV contrast.  COMPARISON:  Radiographs 08/13/2013 and 08/04/2013.  CT 08/02/2012.  FINDINGS: There are stable postsurgical changes related to prior left lower lobe resection. There has been interval enlargement of several mediastinal lymph nodes. These include 12 mm right paratracheal (image 19), 13 mm precarinal (image 23), and 14 mm AP window (image 25) lymph nodes. Some of these nodes have retained fatty hila. Allowing for the limitations of noncontrast technique, the hila appear stable.  There is stable low-density within the right thyroid lobe. Atherosclerosis of the aorta, great vessels and coronary arteries is noted. The heart size is normal. There is no pericardial effusion.  Moderate size dependent pleural effusions are present bilaterally. There is associated dependent airspace disease in both lower lobes. In addition, there are more focal airspace opacities within the right lower lobe and inferior aspect of the left upper lobe. The latter is somewhat nodular, measuring up to 1.8 cm on image 43. No other focal nodularity or endobronchial lesions are demonstrated.  The visualized upper abdomen has a stable appearance. There is no adrenal mass.  There is increased subcutaneous edema throughout the subcutaneous fat, especially within the anterior aspect of the upper abdomen.  No worrisome osseous findings are demonstrated.  IMPRESSION: 1. As demonstrated radiographically, there are bilateral pleural effusions and bilateral airspace opacities which are new compared with the prior CT. Although there are focal somewhat nodular components in the right lower and left upper lobes, these findings are most likely infectious/inflammatory. 2. Progressive mediastinal lymphadenopathy. Some of the nodes have retained fatty hila and may be reactive. Metastatic disease cannot be completely excluded. 3. Stable atherosclerosis and low-density within the right thyroid lobe. 4. If the airspace opacities and  pleural effusions fail to respond to appropriate clinical therapy, follow-up CT or PET-CT may be warranted to exclude metastatic disease.   Electronically Signed   By: Camie Patience M.D.   On: 08/22/2013 10:26   Dg Chest Portable 1 View  08/13/2013   CLINICAL DATA:  Shortness of breath increasing for 2 weeks. History of CHF.  EXAM: PORTABLE CHEST - 1 VIEW  COMPARISON:  08/04/2013  FINDINGS: Again noted are bibasilar lung densities that are suggestive for pleural effusions and atelectasis. Heart size appears to be enlarged. There is some peribronchial thickening and cannot exclude pulmonary edema.  IMPRESSION: Bilateral pleural effusions with basilar atelectasis. There is mild pulmonary edema. Minimal change from the previous examination.   Electronically Signed   By: Markus Daft M.D.   On: 08/13/2013 23:10    ASSESSMENT AND PLAN 1. Cardiogenic shock 2. Acute on chronic systolic HF     EF ~63-33% 3. 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 Acute respiratory failure 5. 3-V CAD 6. Carotid stenosis     - doppler 08/31/2013 shows R 60-79% ICA stenosis, L 80-99% ICA stenosis     - vascular surgery aware 7. Lung CA  h/o LLL lobectomy (2 yr ago due to non-small cell carcinoma)      - PFT  09/05/2013 pre-FEV1 28%, pre-FVC 34%, pre-FEV1/FVC ratio 68 8. DM1 9. Ischemic R foot s/p R femoral thrombectomy and 4-compartment fasciotomy.  10. V/VF arrest 6/3 11. R axillary DVT 12. Severe protein calorie malnutrition   Continues to be very tenuous. Still on high-dose pressors but able to pull a bit more fluid with CVVHD. Drops BP quickly if levophed turned down. Co-ox improved. Will increase midodrine to 5 tid.   Long talk last night with her, her husband and mother-in-law about severity of situation and whether or not she would want mechanical ventilation if her respiratory status gets worse. She is still undecided. Full code for now.   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 Will continue. Start warfarin today. RLE looks better despite high-dose levophed.  The patient is critically ill with multiple organ systems failure and requires high complexity decision making for assessment and support, frequent evaluation and titration of therapies, application of advanced monitoring technologies and extensive interpretation of multiple databases.   Critical Care Time devoted to patient care services described in this note is 35 Minutes.   Adisen Bennion R Kamya Watling,MD 10:08 AM

## 2013-09-25 NOTE — Progress Notes (Addendum)
[facility]  Subjective  - very positive.   Objective 82/52 104 98 F (36.7 C) (Oral) 11 97%  Intake/Output Summary (Last 24 hours) at 09/25/13 1123 Last data filed at 09/25/13 1100  Gross per 24 hour  Intake 1272.7 ml  Output   2802 ml  Net -1529.3 ml    Right groin with ss drainage, wound break down and ecchymosis Right fasciotomy sites with clear drainage.  Clean dry dressing.  Sensation decreased left LE.   Doppler DP signal bilateral.  Cap refill < 3 seconds bilaterally. Moderate edema bilateral LE  Assessment/Planning: POD #6 Right femoral endarterectomy, right femoral thrombectomy, four compartment fasciotomy  Wbc WNL 8.6, TM 98 Right groin wound packed with Clean dry ABD Right leg wrapped with ABD and Kerlix.  Will discuss plan with Dr. Oneida Alar May need wound opened some and VAC eventually depending on overall plan of care    Ulyses Amor 09/25/2013 11:23 AM --   Details as above.  Central portion of right groin wound opened today.  Some skin necrosis above this.  Groin wound healing poorly not unexpected given pt obesity an pannus over incision.  Will start local wound care NS wet to dry TID.  Will probably need serial groin debridement.  Ruta Hinds, MD [facility] Office: [phone] Pager: [phone]  Laboratory Lab Results:  Recent Labs  09/24/13 0400 09/25/13 0448  WBC 8.6 8.6  HGB 8.7* 8.8*  HCT 28.3* 29.2*  PLT 275 275   BMET  Recent Labs  09/24/13 1600 09/25/13 0448  NA 133* 133*  K 4.8 4.4  CL 97 95*  CO2 22 25  GLUCOSE 241* 167*  BUN 13 12  CREATININE 2.00* 2.16*  CALCIUM 7.7* 7.9*    COAG Lab Results  Component Value Date   INR 1.13 09/06/2013   INR 1.09 09/03/2013   INR 0.89 12/04/2012   No results found for this basename: PTT

## 2013-09-25 NOTE — Progress Notes (Signed)
Assessment:  1. Dialysis dependent anuric AoCKD5, cardiogenic shock, cont CRRT(not well tolerated due to low BP) pull fluid as permitted. Good clearance. Adjust prefilter fluid 2. 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 S/p R femoral endarterectomy, thrombectomy , and fasciotomy 5. CAD, ischemic CM 6. Anemia  Plan: PRBCs, Oxygen supplementation continuous, reduce Blood flow rate, supine position, albumin on 6/10 if needed, cont CVVHD.  Subjective: Interval History: One Kg wgt loss over 24 hrs  Objective: Vital signs in last 24 hours: Temp:  [97.3 F (36.3 C)-98.1 F (36.7 C)] 98 F (36.7 C) (06/09 0742) Pulse Rate:  [101-116] 104 (06/09 1000) Resp:  [10-30] 11 (06/09 1000) BP: (61-133)/(19-103) 82/52 mmHg (06/09 1000) SpO2:  [89 %-100 %] 99 % (06/09 0845) Weight:  [133.5 kg (294 lb 5 oz)] 133.5 kg (294 lb 5 oz) (06/09 0500) Weight change: -1 kg (-2 lb 3.3 oz)  Intake/Output from previous day: 06/08 0701 - 06/09 0700 In: 887.3 [I.V.:887.3] Out: 2639  Intake/Output this shift: Total I/O In: 470.4 [P.O.:150; I.V.:320.4] Out: 507 [Other:507]  PE: Diffuse anasarca and obesity  Lab Results:  Recent Labs  09/24/13 0400 09/25/13 0448  WBC 8.6 8.6  HGB 8.7* 8.8*  HCT 28.3* 29.2*  PLT 275 275   BMET:  Recent Labs  09/24/13 1600 09/25/13 0448  NA 133* 133*  K 4.8 4.4  CL 97 95*  CO2 22 25  GLUCOSE 241* 167*  BUN 13 12  CREATININE 2.00* 2.16*  CALCIUM 7.7* 7.9*   No results found for this basename: PTH,  in the last 72 hours Iron Studies: No results found for this basename: IRON, TIBC, TRANSFERRIN, FERRITIN,  in the last 72 hours Studies/Results: No results found.  Scheduled: . amiodarone  400 mg Oral BID  . aspirin  81 mg Oral Daily  . atorvastatin  80 mg Oral q1800  . calcitRIOL  0.25 mcg Oral Daily  . darbepoetin (ARANESP) injection - NON-DIALYSIS  200 mcg Subcutaneous Q Wed-1800  .  insulin aspart  0-15 Units Subcutaneous TID WC  . insulin aspart  0-5 Units Subcutaneous QHS  . insulin glargine  25 Units Subcutaneous Q24H  . lidocaine (PF)  5 mL Other Once  . midodrine  5 mg Oral TID WC  . pantoprazole  40 mg Oral Daily  . polyethylene glycol  17 g Oral Daily  . sodium chloride  10-40 mL Intracatheter Q12H     LOS: 22 days   Estanislado Emms 09/25/2013,10:52 AM  p

## 2013-09-26 DIAGNOSIS — S31109A Unspecified open wound of abdominal wall, unspecified quadrant without penetration into peritoneal cavity, initial encounter: Secondary | ICD-10-CM

## 2013-09-26 LAB — RENAL FUNCTION PANEL
Albumin: 1.7 g/dL — ABNORMAL LOW (ref 3.5–5.2)
Albumin: 1.7 g/dL — ABNORMAL LOW (ref 3.5–5.2)
BUN: 12 mg/dL (ref 6–23)
BUN: 12 mg/dL (ref 6–23)
CHLORIDE: 98 meq/L (ref 96–112)
CHLORIDE: 95 meq/L — AB (ref 96–112)
CO2: 26 mEq/L (ref 19–32)
CO2: 25 meq/L (ref 19–32)
CREATININE: 1.98 mg/dL — AB (ref 0.50–1.10)
Calcium: 7.8 mg/dL — ABNORMAL LOW (ref 8.4–10.5)
Calcium: 7.7 mg/dL — ABNORMAL LOW (ref 8.4–10.5)
Creatinine, Ser: 2.04 mg/dL — ABNORMAL HIGH (ref 0.50–1.10)
GFR calc Af Amer: 33 mL/min — ABNORMAL LOW (ref >90)
GFR calc Af Amer: 34 mL/min — ABNORMAL LOW (ref >90)
GFR calc non Af Amer: 28 mL/min — ABNORMAL LOW (ref >90)
GFR calc non Af Amer: 29 mL/min — ABNORMAL LOW (ref >90)
Glucose, Bld: 240 mg/dL — ABNORMAL HIGH (ref 70–99)
Glucose, Bld: 219 mg/dL — ABNORMAL HIGH (ref 70–99)
PHOSPHORUS: 2.3 mg/dL (ref 2.3–4.6)
POTASSIUM: 4.4 meq/L (ref 3.7–5.3)
Phosphorus: 1.8 mg/dL — ABNORMAL LOW (ref 2.3–4.6)
Potassium: 4.5 mEq/L (ref 3.7–5.3)
Sodium: 135 mEq/L — ABNORMAL LOW (ref 137–147)
Sodium: 133 mEq/L — ABNORMAL LOW (ref 137–147)

## 2013-09-26 LAB — CBC
HCT: 33.6 % — ABNORMAL LOW (ref 36.0–46.0)
Hemoglobin: 10.5 g/dL — ABNORMAL LOW (ref 12.0–15.0)
MCH: 29.9 pg (ref 26.0–34.0)
MCHC: 31.3 g/dL (ref 30.0–36.0)
MCV: 95.7 fL (ref 78.0–100.0)
PLATELETS: 251 10*3/uL (ref 150–400)
RBC: 3.51 MIL/uL — ABNORMAL LOW (ref 3.87–5.11)
RDW: 19 % — ABNORMAL HIGH (ref 11.5–15.5)
WBC: 7.4 10*3/uL (ref 4.0–10.5)

## 2013-09-26 LAB — TYPE AND SCREEN
ABO/RH(D): O POS
Antibody Screen: NEGATIVE
UNIT DIVISION: 0
Unit division: 0
Unit division: 0
Unit division: 0

## 2013-09-26 LAB — CULTURE, BLOOD (ROUTINE X 2)
Culture: NO GROWTH
Culture: NO GROWTH

## 2013-09-26 LAB — CARBOXYHEMOGLOBIN
Carboxyhemoglobin: 2.4 % — ABNORMAL HIGH (ref 0.5–1.5)
Methemoglobin: 0.7 % (ref 0.0–1.5)
O2 SAT: 66.2 %
Total hemoglobin: 11.5 g/dL — ABNORMAL LOW (ref 12.0–16.0)

## 2013-09-26 LAB — GLUCOSE, CAPILLARY
GLUCOSE-CAPILLARY: 201 mg/dL — AB (ref 70–99)
Glucose-Capillary: 223 mg/dL — ABNORMAL HIGH (ref 70–99)
Glucose-Capillary: 229 mg/dL — ABNORMAL HIGH (ref 70–99)
Glucose-Capillary: 224 mg/dL — ABNORMAL HIGH (ref 70–99)

## 2013-09-26 LAB — APTT: aPTT: 88 seconds — ABNORMAL HIGH (ref 24–37)

## 2013-09-26 LAB — CORTISOL
CORTISOL PLASMA: 17.4 ug/dL
CORTISOL PLASMA: 15.7 ug/dL

## 2013-09-26 LAB — MAGNESIUM: Magnesium: 2.5 mg/dL (ref 1.5–2.5)

## 2013-09-26 LAB — PROTIME-INR
INR: 2.61 — AB (ref 0.00–1.49)
PROTHROMBIN TIME: 27 s — AB (ref 11.6–15.2)

## 2013-09-26 MED ORDER — NEPRO/CARBSTEADY PO LIQD
237.0000 mL | Freq: Three times a day (TID) | ORAL | Status: DC
  Administered 2013-09-26 – 2013-10-27 (×68): 237 mL via ORAL
  Filled 2013-09-26 (×120): qty 237

## 2013-09-26 MED ORDER — WARFARIN SODIUM 5 MG PO TABS
5.0000 mg | ORAL_TABLET | Freq: Once | ORAL | Status: DC
  Filled 2013-09-26: qty 1

## 2013-09-26 MED ORDER — PIPERACILLIN-TAZOBACTAM 3.375 G IVPB 30 MIN
3.3750 g | Freq: Four times a day (QID) | INTRAVENOUS | Status: DC
  Administered 2013-09-26 – 2013-09-28 (×10): 3.375 g via INTRAVENOUS
  Filled 2013-09-26 (×12): qty 50

## 2013-09-26 MED ORDER — VANCOMYCIN HCL 10 G IV SOLR
1250.0000 mg | INTRAVENOUS | Status: DC
  Administered 2013-09-27 – 2013-09-28 (×2): 1250 mg via INTRAVENOUS
  Filled 2013-09-26 (×3): qty 1250

## 2013-09-26 MED ORDER — SODIUM CHLORIDE 0.9 % IV SOLN
2000.0000 mg | Freq: Once | INTRAVENOUS | Status: AC
  Administered 2013-09-26: 2000 mg via INTRAVENOUS
  Filled 2013-09-26: qty 2000

## 2013-09-26 MED ORDER — MIDODRINE HCL 5 MG PO TABS
7.5000 mg | ORAL_TABLET | Freq: Three times a day (TID) | ORAL | Status: DC
  Administered 2013-09-26 – 2013-09-27 (×5): 7.5 mg via ORAL
  Filled 2013-09-26 (×9): qty 1

## 2013-09-26 NOTE — Progress Notes (Signed)
[facility]  Subjective  - No complaints, groin sore   Objective 125/65 113 98.1 F (36.7 C) (Oral) 16 95%  Intake/Output Summary (Last 24 hours) at 09/26/13 1156 Last data filed at 09/26/13 1100  Gross per 24 hour  Intake 2113.6 ml  Output   6202 ml  Net -4088.4 ml   Central portion of wound open, no healing going on, necrotic fat, upper portion of wound skin necrosis Fasciotomies clean and healing so far  Assessment/Planning: Severe protein calorie malnutrition contributing to wound healing issues start Nepro Debridement right groin in OR with Dr Trula Slade 09/28/13 Agree with antibiotics Will check transferrin and zinc level Continue local wound care  Jahad Old E 09/26/2013 11:56 AM --  Laboratory Lab Results:  Recent Labs  09/25/13 0448 09/26/13 0407  WBC 8.6 7.4  HGB 8.8* 10.5*  HCT 29.2* 33.6*  PLT 275 251   BMET  Recent Labs  09/25/13 1600 09/26/13 0407  NA 135* 135*  K 4.4 4.5  CL 98 98  CO2 26 26  GLUCOSE 185* 240*  BUN 12 12  CREATININE 2.04* 2.04*  CALCIUM 7.9* 7.8*    COAG Lab Results  Component Value Date   INR 2.61* 09/26/2013   INR 2.40* 09/25/2013   INR 1.13 09/06/2013   No results found for this basename: PTT

## 2013-09-26 NOTE — Progress Notes (Signed)
ADVANCED HEART FAILURE ROUNDING NOTE   SUBJECTIVE  43 yo morbidly obese female with PMH of DM1, stage V kidney disease, blindness, h/o lung CA s/p resection who was recently discharged for heart failure symptom had a high risk stress test which showed ischemia in anterior and apical region. She underwent cath 09/04/2013 which showed chronically occluded RCA and triple vessel dx. CT surgery consulted but not felt to be surgical candidate due to poor PFTs, suboptimal targets, comorbidities and shock. EF 22% by Myoview. 30-35% by echo  Underwent placement of swan and IABP last week for shock (co-ox 38%). Unfortunately developed cold foot and IABP had to be removed.Underwent R femoral thrombectomy and 4 compartment fasciotomy of RLE on 6/1.On 6/3 had VT/VF->PEA arrest 10 mins CPR. U/s shows R axillary DVT  Now on levophed 35 dobutamine 5. Midodrine increased to 5 tid. BP improved. Volume removal much improved. Down 4 pounds. Now pulling ~150-200/hr. R groin thrombectomy site painful.  Co-ox 66% this am.     CURRENT MEDS . amiodarone  400 mg Oral BID  . aspirin  81 mg Oral Daily  . atorvastatin  80 mg Oral q1800  . calcitRIOL  0.25 mcg Oral Daily  . darbepoetin (ARANESP) injection - NON-DIALYSIS  200 mcg Subcutaneous Q Wed-1800  . insulin aspart  0-15 Units Subcutaneous TID WC  . insulin aspart  0-5 Units Subcutaneous QHS  . insulin glargine  25 Units Subcutaneous Q24H  . lidocaine (PF)  5 mL Other Once  . midodrine  7.5 mg Oral TID WC  . pantoprazole  40 mg Oral Daily  . piperacillin-tazobactam  3.375 g Intravenous 4 times per day  . polyethylene glycol  17 g Oral Daily  . sodium chloride  10-40 mL Intracatheter Q12H  . [START ON 09/27/2013] vancomycin  1,250 mg Intravenous Q24H  . vancomycin  2,000 mg Intravenous Once  . warfarin  5 mg Oral ONCE-1800  . Warfarin - Pharmacist Dosing Inpatient   Does not apply q1800    OBJECTIVE  Filed Vitals:   09/26/13 0915 09/26/13 0930 09/26/13  0945 09/26/13 1000  BP: 119/82 110/39 112/75 101/59  Pulse: 114 113 106 110  Temp:      TempSrc:      Resp: 10 16 18 17   Height:      Weight:      SpO2: 100% 100% 100% 100%    Intake/Output Summary (Last 24 hours) at 09/26/13 1036 Last data filed at 09/26/13 1000  Gross per 24 hour  Intake 2176.8 ml  Output   6108 ml  Net -3931.2 ml   Filed Weights   09/24/13 0600 09/25/13 0500 09/26/13 0500  Weight: 134.5 kg (296 lb 8.3 oz) 133.5 kg (294 lb 5 oz) 130.9 kg (288 lb 9.3 oz)    PHYSICAL EXAM CVP 12-14 General:Lying in bed.  NAD Neuro: Alert and oriented X 3. Moves all extremities spontaneous HEENT:  Normal  Neck: Supple without bruits. Chest L subclav PC. R subclav TLC. Unable to see JVD Lungs:  clear Heart: tachy regular. +s3 Abdomen: Soft, non-tender, non-distended, BS + x 4.  Extremities: 2-3+ edema. R foot. Warm Strength improved DP non palpable but dopplerable. R arm swelling improved Large, deep foul smelling wound in R groin with fat necrosis and surrounding cellulitis.   CBC  Recent Labs  09/25/13 0448 09/26/13 0407  WBC 8.6 7.4  HGB 8.8* 10.5*  HCT 29.2* 33.6*  MCV 94.8 95.7  PLT 275 161   Basic Metabolic Panel  Recent Labs  09/25/13 0448 09/25/13 1600 09/26/13 0407  NA 133* 135* 135*  K 4.4 4.4 4.5  CL 95* 98 98  CO2 25 26 26   GLUCOSE 167* 185* 240*  BUN 12 12 12   CREATININE 2.16* 2.04* 2.04*  CALCIUM 7.9* 7.9* 7.8*  MG 2.5  --  2.5  PHOS 2.1* 2.0* 2.3   Liver Function Tests  Recent Labs  09/25/13 1600 09/26/13 0407  ALBUMIN 1.7* 1.7*   Cardiac Enzymes No results found for this basename: CKTOTAL, CKMB, CKMBINDEX, TROPONINI,  in the last 72 hours Thyroid Function Tests No results found for this basename: TSH, T4TOTAL, FREET3, T3FREE, THYROIDAB,  in the last 72 hours  TELE  Sinus tachycardia 100-110   Radiology/Studies  Dg Chest 2 View  09/06/2013   CLINICAL DATA:  CHF, intermittent dyspnea  EXAM: CHEST  2 VIEW  COMPARISON:   Prior chest x-ray 08/13/2013; prior chest CT 08/22/2013  FINDINGS: Stable cardiac and mediastinal contours. Atherosclerotic calcifications are present within the transverse aorta. Moderate left and small right pleural effusions are similar compared to prior. There is persistent associated bibasilar atelectasis. Mild pulmonary vascular congestion without overt edema. No pneumothorax. No new focal airspace consolidation. No acute osseous abnormality.  IMPRESSION: 1. Stable moderate left and small right pleural effusions and associated bibasilar atelectasis. 2. Pulmonary vascular congestion without overt edema.   Electronically Signed   By: Jacqulynn Cadet M.D.   On: 09/06/2013 07:55   Ct Chest Wo Contrast  08/22/2013   CLINICAL DATA:  Left lung cancer status post lobectomy. Weight gain. Renal insufficiency.  EXAM: CT CHEST WITHOUT CONTRAST  TECHNIQUE: Multidetector CT imaging of the chest was performed following the standard protocol without IV contrast.  COMPARISON:  Radiographs 08/13/2013 and 08/04/2013.  CT 08/02/2012.  FINDINGS: There are stable postsurgical changes related to prior left lower lobe resection. There has been interval enlargement of several mediastinal lymph nodes. These include 12 mm right paratracheal (image 19), 13 mm precarinal (image 23), and 14 mm AP window (image 25) lymph nodes. Some of these nodes have retained fatty hila. Allowing for the limitations of noncontrast technique, the hila appear stable.  There is stable low-density within the right thyroid lobe. Atherosclerosis of the aorta, great vessels and coronary arteries is noted. The heart size is normal. There is no pericardial effusion.  Moderate size dependent pleural effusions are present bilaterally. There is associated dependent airspace disease in both lower lobes. In addition, there are more focal airspace opacities within the right lower lobe and inferior aspect of the left upper lobe. The latter is somewhat nodular,  measuring up to 1.8 cm on image 43. No other focal nodularity or endobronchial lesions are demonstrated.  The visualized upper abdomen has a stable appearance. There is no adrenal mass. There is increased subcutaneous edema throughout the subcutaneous fat, especially within the anterior aspect of the upper abdomen.  No worrisome osseous findings are demonstrated.  IMPRESSION: 1. As demonstrated radiographically, there are bilateral pleural effusions and bilateral airspace opacities which are new compared with the prior CT. Although there are focal somewhat nodular components in the right lower and left upper lobes, these findings are most likely infectious/inflammatory. 2. Progressive mediastinal lymphadenopathy. Some of the nodes have retained fatty hila and may be reactive. Metastatic disease cannot be completely excluded. 3. Stable atherosclerosis and low-density within the right thyroid lobe. 4. If the airspace opacities and pleural effusions fail to respond to appropriate clinical therapy, follow-up CT or PET-CT may be  warranted to exclude metastatic disease.   Electronically Signed   By: Camie Patience M.D.   On: 08/22/2013 10:26   Dg Chest Portable 1 View  08/13/2013   CLINICAL DATA:  Shortness of breath increasing for 2 weeks. History of CHF.  EXAM: PORTABLE CHEST - 1 VIEW  COMPARISON:  08/04/2013  FINDINGS: Again noted are bibasilar lung densities that are suggestive for pleural effusions and atelectasis. Heart size appears to be enlarged. There is some peribronchial thickening and cannot exclude pulmonary edema.  IMPRESSION: Bilateral pleural effusions with basilar atelectasis. There is mild pulmonary edema. Minimal change from the previous examination.   Electronically Signed   By: Markus Daft M.D.   On: 08/13/2013 23:10    ASSESSMENT AND PLAN 1. Cardiogenic shock 2. Acute on chronic systolic HF     EF ~48-18% 3. 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 Acute respiratory failure 5. 3-V CAD 6. Carotid stenosis      - doppler 08/31/2013 shows R 60-79% ICA stenosis, L 80-99% ICA stenosis     - vascular surgery aware 7. 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 DM1 9. Ischemic R foot s/p R femoral thrombectomy and 4-compartment fasciotomy.  10. V/VF arrest 6/3 11. R axillary DVT 12. Severe protein calorie malnutrition  13. Large right groin wound   Still on high-dose pressors but able to pull a bit more fluid with CVVHD. Co-ox improved. Will increase midodrine to 7.5 tid. Will not wean pressors until volume improved. Appreciate Renal's help.  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 Will continue. Will not start coumadin as she will likely need surgical debridement.   Her groin wound is infected and deep with fat necrosis and surrounding infection. I had Darrick Grinder NP evaluate and we will also get a formal  Wound care consult. Culture obtained. Start vanc/zosyn. Will need dietary to help with nutritional support. Will discuss with Dr. Oneida Alar.    The patient is critically ill with multiple organ systems failure and requires high complexity decision making for assessment and support, frequent evaluation and titration of therapies, application of advanced monitoring technologies and extensive interpretation of multiple databases.   Critical Care Time devoted to patient care services described in this note is 45 Minutes.   Daniel Bensimhon,MD 10:36 AM

## 2013-09-26 NOTE — Progress Notes (Addendum)
ANTICOAGULATION CONSULT NOTE - Follow Up Consult  Pharmacy Consult:  Argatroban / Coumadin  Indication:  Thromboembolic state  Allergies  Allergen Reactions  . Crestor [Rosuvastatin] Other (See Comments)    Severe muscle weakness  . Nsaids Other (See Comments)    Not allergic, "bad on my kidneys"  . Ciprofloxacin Rash    Patient Measurements: Height: 5' 7.5" (171.5 cm) Weight: 288 lb 9.3 oz (130.9 kg) IBW/kg (Calculated) : 62.75  Vital Signs: Temp: 98.1 F (36.7 C) (06/10 0734) Temp src: Oral (06/10 0734) BP: 101/59 mmHg (06/10 1000) Pulse Rate: 110 (06/10 1000)  Labs:  Recent Labs  09/24/13 0400  09/25/13 0448 09/25/13 1308 09/25/13 1600 09/26/13 0407  HGB 8.7*  --  8.8*  --   --  10.5*  HCT 28.3*  --  29.2*  --   --  33.6*  PLT 275  --  275  --   --  251  APTT 80*  --  86*  --   --  88*  LABPROT  --   --   --  25.4*  --  27.0*  INR  --   --   --  2.40*  --  2.61*  CREATININE 1.80*  < > 2.16*  --  2.04* 2.04*  < > = values in this interval not displayed.  Estimated Creatinine Clearance: 49.5 ml/min (by C-G formula based on Cr of 2.04).  Assessment: 36 YOF with right foot ischemia s/p femoral endarterectomy and thrombectomy on 09/17/13.  S/p fasciotomy closure on 09/19/13. HIT panel negative. Now on CVVHD.  Argatroban therapeutic aPTT 88 sec on 0.56mcg/kg/min, CBC stable, no bleeding noted.   Marland Kitchen "Baseline" INR 2.4> 2.6, but falsely elevated due to argatroban.  Will need 5 day overlap with warfarin and argatroban, and argatroban can be held when INR >= 4 and rechecked to determine actual INR.    Possible I&D for wound infection - hold warfarin until plan of course determined  Goal of Therapy:  aPTT 50 - 90 seconds Monitor platelets by anticoagulation protocol: Yes   Plan 1. Continue Argatroban at 0.4 mcg/kg/min 2. Daily aPTT 3. Defer education for now until prognosis more clear.  Bonnita Nasuti Pharm.D. CPP, BCPS Clinical Pharmacist [phone] 09/26/2013 10:39  AM

## 2013-09-26 NOTE — Progress Notes (Signed)
ANTIBIOTIC CONSULT NOTE - INITIAL  Pharmacy Consult for Vancomycin / Zosyn Indication: Wound infection  Allergies  Allergen Reactions  . Crestor [Rosuvastatin] Other (See Comments)    Severe muscle weakness  . Nsaids Other (See Comments)    Not allergic, "bad on my kidneys"  . Ciprofloxacin Rash    Patient Measurements: Height: 5' 7.5" (171.5 cm) Weight: 288 lb 9.3 oz (130.9 kg) IBW/kg (Calculated) : 62.75   Vital Signs: Temp: 98.1 F (36.7 C) (06/10 0734) Temp src: Oral (06/10 0734) BP: 101/59 mmHg (06/10 1000) Pulse Rate: 110 (06/10 1000) Intake/Output from previous day: 06/09 0701 - 06/10 0700 In: 2350.1 [P.O.:350; I.V.:1335.1; Blood:665] Out: 5868  Intake/Output from this shift: Total I/O In: 297.1 [P.O.:150; I.V.:147.1] Out: 747 [Other:747]  Labs:  Recent Labs  09/24/13 0400  09/25/13 0448 09/25/13 1600 09/26/13 0407  WBC 8.6  --  8.6  --  7.4  HGB 8.7*  --  8.8*  --  10.5*  PLT 275  --  275  --  251  CREATININE 1.80*  < > 2.16* 2.04* 2.04*  < > = values in this interval not displayed. Estimated Creatinine Clearance: 49.5 ml/min (by C-G formula based on Cr of 2.04). No results found for this basename: VANCOTROUGH, Corlis Leak, VANCORANDOM, GENTTROUGH, GENTPEAK, GENTRANDOM, TOBRATROUGH, TOBRAPEAK, TOBRARND, AMIKACINPEAK, AMIKACINTROU, AMIKACIN,  in the last 72 hours   Microbiology: Recent Results (from the past 720 hour(s))  SURGICAL PCR SCREEN     Status: None   Collection Time    09/05/13  6:21 PM      Result Value Ref Range Status   MRSA, PCR NEGATIVE  NEGATIVE Final   Staphylococcus aureus NEGATIVE  NEGATIVE Final   Comment:            The Xpert SA Assay (FDA     approved for NASAL specimens     in patients over 74 years of age),     is one component of     a comprehensive surveillance     program.  Test performance has     been validated by [employer] for patients greater     than or equal to 79 year old.     It is not intended     to  diagnose infection nor to     guide or monitor treatment.  CULTURE, BLOOD (ROUTINE X 2)     Status: None   Collection Time    09/19/13  9:25 PM      Result Value Ref Range Status   Specimen Description BLOOD HEMODIALYSIS CATHETER   Final   Special Requests BOTTLES DRAWN AEROBIC ONLY 10CC   Final   Culture  Setup Time     Final   Value: 09/20/2013 00:44     Performed at [facility]   Culture     Final   Value: NO GROWTH 5 DAYS     Performed at [facility]   Report Status 09/26/2013 FINAL   Final  CULTURE, BLOOD (ROUTINE X 2)     Status: None   Collection Time    09/19/13  9:35 PM      Result Value Ref Range Status   Specimen Description BLOOD LEFT HAND   Final   Special Requests BOTTLES DRAWN AEROBIC ONLY 5CC   Final   Culture  Setup Time     Final   Value: 09/20/2013 00:43     Performed at [facility]  Culture     Final   Value: NO GROWTH 5 DAYS     Performed at [facility]   Report Status 09/26/2013 FINAL   Final    Medical History: Past Medical History  Diagnosis Date  . High cholesterol   . Diabetic retinopathy   . Peripheral neuropathy     "tips of toes"  . Blind right eye   . CHF (congestive heart failure)   . CAD (coronary artery disease)   . GERD (gastroesophageal reflux disease)   . Hypertension   . History of lung cancer 07/2011    s/p left lower lobectomy  . Diabetes mellitus     IDDM  . Cataract of right eye   . Ulcer of toe of right foot 07/10/2012    great toe  . Breast calcification, left 06/2012  . Acute biphenotypic leukemia   . CKD (chronic kidney disease) stage 5, GFR less than 15 ml/min   . Nephrotic syndrome   . Gastritis     H/o gastritis on prior endoscopy  . Anemia   . Carotid artery disease      Assessment: 43yof with multiple medical problems on CVVHD and  s/p femoral embolectomy.  She now has wound infection at groin site.  Will begin broad spectrum ABX.  Goal of Therapy:  Vancomycin trough  level 10-15 mcg/ml  Plan:  Zosyn 3.375 Gm q6 Vancomycin 2Gm x1 then 1250 mg IV q24hr starting tomorrow  Bonnita Nasuti Pharm.D. CPP, BCPS Clinical Pharmacist [phone] 09/26/2013 10:44 AM

## 2013-09-26 NOTE — Progress Notes (Signed)
Assessment:  1. Dialysis dependent anuric AoCKD5, cardiogenic shock, cont CRRT--improved UF with current maneuvers 2. 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 S/p R femoral endarterectomy, thrombectomy , and fasciotomy 5. CAD, ischemic CM 6. Anemia Plan: Cont current UF rate, Albumin prn  Subjective: Interval History: Better UF  Objective: Vital signs in last 24 hours: Temp:  [97.5 F (36.4 C)-98.3 F (36.8 C)] 98.1 F (36.7 C) (06/10 0734) Pulse Rate:  [74-119] 110 (06/10 1000) Resp:  [10-24] 17 (06/10 1000) BP: (90-132)/(16-97) 101/59 mmHg (06/10 1000) SpO2:  [84 %-100 %] 100 % (06/10 1000) Weight:  [130.9 kg (288 lb 9.3 oz)] 130.9 kg (288 lb 9.3 oz) (06/10 0500) Weight change: -2.6 kg (-5 lb 11.7 oz)  Intake/Output from previous day: 06/09 0701 - 06/10 0700 In: 2350.1 [P.O.:350; I.V.:1335.1; Blood:665] Out: 5868  Intake/Output this shift: Total I/O In: 297.1 [P.O.:150; I.V.:147.1] Out: 747 [Other:747]  General appearance: alert and cooperative Extremities: difusse anasarca, improved a little  Lab Results:  Recent Labs  09/25/13 0448 09/26/13 0407  WBC 8.6 7.4  HGB 8.8* 10.5*  HCT 29.2* 33.6*  PLT 275 251   BMET:  Recent Labs  09/25/13 1600 09/26/13 0407  NA 135* 135*  K 4.4 4.5  CL 98 98  CO2 26 26  GLUCOSE 185* 240*  BUN 12 12  CREATININE 2.04* 2.04*  CALCIUM 7.9* 7.8*   No results found for this basename: PTH,  in the last 72 hours Iron Studies: No results found for this basename: IRON, TIBC, TRANSFERRIN, FERRITIN,  in the last 72 hours Studies/Results: No results found.  Scheduled: . amiodarone  400 mg Oral BID  . aspirin  81 mg Oral Daily  . atorvastatin  80 mg Oral q1800  . calcitRIOL  0.25 mcg Oral Daily  . darbepoetin (ARANESP) injection - NON-DIALYSIS  200 mcg Subcutaneous Q Wed-1800  . insulin aspart  0-15 Units Subcutaneous TID WC  . insulin aspart  0-5 Units  Subcutaneous QHS  . insulin glargine  25 Units Subcutaneous Q24H  . lidocaine (PF)  5 mL Other Once  . midodrine  7.5 mg Oral TID WC  . pantoprazole  40 mg Oral Daily  . piperacillin-tazobactam  3.375 g Intravenous 4 times per day  . polyethylene glycol  17 g Oral Daily  . sodium chloride  10-40 mL Intracatheter Q12H  . [START ON 09/27/2013] vancomycin  1,250 mg Intravenous Q24H  . vancomycin  2,000 mg Intravenous Once  . Warfarin - Pharmacist Dosing Inpatient   Does not apply q1800     LOS: 23 days   Hannah [name] C 09/26/2013,10:55 AM

## 2013-09-27 LAB — RENAL FUNCTION PANEL
ALBUMIN: 1.7 g/dL — AB (ref 3.5–5.2)
Albumin: 1.7 g/dL — ABNORMAL LOW (ref 3.5–5.2)
BUN: 17 mg/dL (ref 6–23)
BUN: 14 mg/dL (ref 6–23)
CO2: 26 mEq/L (ref 19–32)
CO2: 26 mEq/L (ref 19–32)
CREATININE: 2.1 mg/dL — AB (ref 0.50–1.10)
Calcium: 7.7 mg/dL — ABNORMAL LOW (ref 8.4–10.5)
Calcium: 7.7 mg/dL — ABNORMAL LOW (ref 8.4–10.5)
Chloride: 99 mEq/L (ref 96–112)
Chloride: 95 mEq/L — ABNORMAL LOW (ref 96–112)
Creatinine, Ser: 2.07 mg/dL — ABNORMAL HIGH (ref 0.50–1.10)
GFR calc Af Amer: 32 mL/min — ABNORMAL LOW (ref >90)
GFR calc non Af Amer: 28 mL/min — ABNORMAL LOW (ref >90)
GFR calc non Af Amer: 27 mL/min — ABNORMAL LOW (ref >90)
GFR, EST AFRICAN AMERICAN: 32 mL/min — AB (ref >90)
Glucose, Bld: 277 mg/dL — ABNORMAL HIGH (ref 70–99)
Glucose, Bld: 267 mg/dL — ABNORMAL HIGH (ref 70–99)
PHOSPHORUS: 2 mg/dL — AB (ref 2.3–4.6)
POTASSIUM: 4.5 meq/L (ref 3.7–5.3)
Phosphorus: 2.4 mg/dL (ref 2.3–4.6)
Potassium: 4.5 mEq/L (ref 3.7–5.3)
SODIUM: 135 meq/L — AB (ref 137–147)
Sodium: 134 mEq/L — ABNORMAL LOW (ref 137–147)

## 2013-09-27 LAB — CARBOXYHEMOGLOBIN
CARBOXYHEMOGLOBIN: 2.2 % — AB (ref 0.5–1.5)
METHEMOGLOBIN: 0.9 % (ref 0.0–1.5)
O2 SAT: 70.5 %
TOTAL HEMOGLOBIN: 10.7 g/dL — AB (ref 12.0–16.0)

## 2013-09-27 LAB — CBC
HCT: 32.7 % — ABNORMAL LOW (ref 36.0–46.0)
Hemoglobin: 9.9 g/dL — ABNORMAL LOW (ref 12.0–15.0)
MCH: 29 pg (ref 26.0–34.0)
MCHC: 30.3 g/dL (ref 30.0–36.0)
MCV: 95.9 fL (ref 78.0–100.0)
PLATELETS: 235 10*3/uL (ref 150–400)
RBC: 3.41 MIL/uL — AB (ref 3.87–5.11)
RDW: 18.3 % — ABNORMAL HIGH (ref 11.5–15.5)
WBC: 9.2 10*3/uL (ref 4.0–10.5)

## 2013-09-27 LAB — TRANSFERRIN: Transferrin: 136 mg/dL — ABNORMAL LOW (ref 200–360)

## 2013-09-27 LAB — GLUCOSE, CAPILLARY
GLUCOSE-CAPILLARY: 231 mg/dL — AB (ref 70–99)
Glucose-Capillary: 265 mg/dL — ABNORMAL HIGH (ref 70–99)
Glucose-Capillary: 275 mg/dL — ABNORMAL HIGH (ref 70–99)
Glucose-Capillary: 291 mg/dL — ABNORMAL HIGH (ref 70–99)

## 2013-09-27 LAB — MAGNESIUM: Magnesium: 2.4 mg/dL (ref 1.5–2.5)

## 2013-09-27 LAB — PROTIME-INR
INR: 2.58 — ABNORMAL HIGH (ref 0.00–1.49)
PROTHROMBIN TIME: 26.8 s — AB (ref 11.6–15.2)

## 2013-09-27 LAB — APTT: aPTT: 81 seconds — ABNORMAL HIGH (ref 24–37)

## 2013-09-27 LAB — PREALBUMIN: PREALBUMIN: 6 mg/dL — AB (ref 17.0–34.0)

## 2013-09-27 MED ORDER — HEPARIN SODIUM (PORCINE) 1000 UNIT/ML DIALYSIS: 1000.0000 [IU] | INTRAMUSCULAR | Status: DC | PRN

## 2013-09-27 NOTE — Progress Notes (Signed)
ANTICOAGULATION CONSULT NOTE - Follow Up Consult  Pharmacy Consult:  Argatroban / Coumadin  Indication:  Thromboembolic state  Allergies  Allergen Reactions  . Crestor [Rosuvastatin] Other (See Comments)    Severe muscle weakness  . Nsaids Other (See Comments)    Not allergic, "bad on my kidneys"  . Ciprofloxacin Rash    Patient Measurements: Height: 5' 7.5" (171.5 cm) Weight: 280 lb 6.8 oz (127.2 kg) IBW/kg (Calculated) : 62.75  Vital Signs: Temp: 97.9 F (36.6 C) (06/11 1516) Temp src: Oral (06/11 1516) BP: 98/47 mmHg (06/11 1500) Pulse Rate: 101 (06/11 1500)  Labs:  Recent Labs  09/25/13 0448 09/25/13 1308  09/26/13 0407 09/26/13 1600 09/27/13 0300 09/27/13 0408 09/27/13 0409  HGB 8.8*  --   --  10.5*  --   --  9.9*  --   HCT 29.2*  --   --  33.6*  --   --  32.7*  --   PLT 275  --   --  251  --   --  235  --   APTT Hannah*  --   --  88*  --   --  81*  --   LABPROT  --  25.4*  --  27.0*  --   --  26.8*  --   INR  --  2.40*  --  2.61*  --   --  2.58*  --   CREATININE 2.16*  --   < > 2.04* 1.98* 2.07*  --  2.10*  < > = values in this interval not displayed.  Estimated Creatinine Clearance: 47.3 ml/min (by C-G formula based on Cr of 2.1).  Assessment: Hannah [name] with right foot ischemia s/p femoral endarterectomy and thrombectomy on 09/17/13.  S/p fasciotomy closure on 09/19/13. HIT panel negative. Now on CVVHD.  Argatroban therapeutic aPTT 81 sec on 0.72mcg/kg/min, CBC stable, no bleeding noted.   . INR 2.6, but falsely elevated due to argatroban.  Will need 5 day overlap with warfarin and argatroban, and argatroban can be held when INR >= 4 and rechecked to determine actual INR.    Plan I&D for wound infection 6/12- hold warfarin until after OR  Goal of Therapy:  aPTT 50 - 90 seconds Monitor platelets by anticoagulation protocol: Yes   Plan 1. Continue Argatroban at 0.4 mcg/kg/min 2. Daily aPTT 3. Defer education for now until prognosis more clear.  Bonnita Nasuti  Pharm.D. CPP, BCPS Clinical Pharmacist [phone] 09/27/2013 4:30 PM

## 2013-09-27 NOTE — Progress Notes (Signed)
Assessment:  1. Dialysis dependent anuric AoCKD5, cardiogenic shock, cont CRRT--improved UF with current maneuvers 2. 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 S/p R femoral endarterectomy wound complications, thrombectomy , and fasciotomy 5. CAD, ischemic CM 6. Anemia Plan: Cont current UF rate, Albumin prn  Subjective: Interval History: Good uf with cvvhd  Objective: Vital signs in last 24 hours: Temp:  [97.3 F (36.3 [name])-98.2 F (36.8 [name])] 97.3 F (36.3 [name]) (06/11 1130) Pulse Rate:  [73-121] 109 (06/11 1045) Resp:  [10-27] 12 (06/11 1130) BP: (81-126)/(41-98) 116/77 mmHg (06/11 1130) SpO2:  [85 %-100 %] 93 % (06/11 1045) Weight:  [127.2 kg (280 lb 6.8 oz)] 127.2 kg (280 lb 6.8 oz) (06/11 0500) Weight change: -3.7 kg (-8 lb 2.5 oz)  Intake/Output from previous day: 06/10 0701 - 06/11 0700 In: 2002.6 [P.O.:500; I.V.:1102.6; IV Piggyback:400] Out: 6738  Intake/Output this shift: Total I/O In: 494 [P.O.:330; I.V.:164] Out: 1320 [Other:1320]  General appearance: alert and cooperative Extremities: edema improving decreased anasarca  Lab Results:  Recent Labs  09/26/13 0407 09/27/13 0408  WBC 7.4 9.2  HGB 10.5* 9.9*  HCT 33.6* 32.7*  PLT 251 235   BMET:  Recent Labs  09/26/13 1600 09/27/13 0409  NA 133* 134*  K 4.4 4.5  CL 95* 95*  CO2 25 26  GLUCOSE 219* 267*  BUN 12 14  CREATININE 1.98* 2.10*  CALCIUM 7.7* 7.7*   No results found for this basename: PTH,  in the last 72 hours Iron Studies:  Recent Labs  09/26/13 1230  TRANSFERRIN 136*   Studies/Results: No results found.  Scheduled: . amiodarone  400 mg Oral BID  . aspirin  81 mg Oral Daily  . atorvastatin  80 mg Oral q1800  . calcitRIOL  0.25 mcg Oral Daily  . darbepoetin (ARANESP) injection - NON-DIALYSIS  200 mcg Subcutaneous Q Wed-1800  . feeding supplement (NEPRO CARB STEADY)  237 mL Oral TID WC  . insulin aspart  0-15  Units Subcutaneous TID WC  . insulin aspart  0-5 Units Subcutaneous QHS  . insulin glargine  25 Units Subcutaneous Q24H  . lidocaine (PF)  5 mL Other Once  . midodrine  7.5 mg Oral TID WC  . pantoprazole  40 mg Oral Daily  . piperacillin-tazobactam  3.375 g Intravenous 4 times per day  . polyethylene glycol  17 g Oral Daily  . sodium chloride  10-40 mL Intracatheter Q12H  . vancomycin  1,250 mg Intravenous Q24H     LOS: 24 days   Hannah [name] 09/27/2013,11:58 AM

## 2013-09-27 NOTE — Progress Notes (Signed)
Wound culture results shown to MD Bensimhon. Pt already on vanc and zosyn.

## 2013-09-27 NOTE — Progress Notes (Signed)
ADVANCED HEART FAILURE ROUNDING NOTE   SUBJECTIVE  43 yo morbidly obese female with PMH of DM1, stage V kidney disease, blindness, h/o lung CA s/p resection who was recently discharged for heart failure symptom had a high risk stress test which showed ischemia in anterior and apical region. She underwent cath 09/04/2013 which showed chronically occluded RCA and triple vessel dx. CT surgery consulted but not felt to be surgical candidate due to poor PFTs, suboptimal targets, comorbidities and shock. EF 22% by Myoview. 30-35% by echo  Underwent placement of swan and IABP last week for shock (co-ox 38%). Unfortunately developed cold foot and IABP had to be removed.Underwent R femoral thrombectomy and 4 compartment fasciotomy of RLE on 6/1.On 6/3 had VT/VF->PEA arrest 10 mins CPR. U/s shows R axillary DVT  Midodrine increased yesterday. Levophed down to 15. Also on dobutamine 5. Volume removal much improved. Down 8 pounds. Now pulling ~200-250/hr. R groin site painful. Pending cleaning out tomorrow.   Co-ox 71% this am.     CURRENT MEDS . amiodarone  400 mg Oral BID  . aspirin  81 mg Oral Daily  . atorvastatin  80 mg Oral q1800  . calcitRIOL  0.25 mcg Oral Daily  . darbepoetin (ARANESP) injection - NON-DIALYSIS  200 mcg Subcutaneous Q Wed-1800  . feeding supplement (NEPRO CARB STEADY)  237 mL Oral TID WC  . insulin aspart  0-15 Units Subcutaneous TID WC  . insulin aspart  0-5 Units Subcutaneous QHS  . insulin glargine  25 Units Subcutaneous Q24H  . lidocaine (PF)  5 mL Other Once  . midodrine  7.5 mg Oral TID WC  . pantoprazole  40 mg Oral Daily  . piperacillin-tazobactam  3.375 g Intravenous 4 times per day  . polyethylene glycol  17 g Oral Daily  . sodium chloride  10-40 mL Intracatheter Q12H  . vancomycin  1,250 mg Intravenous Q24H    OBJECTIVE  Filed Vitals:   09/27/13 1815 09/27/13 1830 09/27/13 1845 09/27/13 1900  BP: 94/44 95/50 101/77 100/57  Pulse: 99   103  Temp:       TempSrc:      Resp: 15 14 20 18   Height:      Weight:      SpO2: 100%   100%    Intake/Output Summary (Last 24 hours) at 09/27/13 1931 Last data filed at 09/27/13 1900  Gross per 24 hour  Intake 2156.4 ml  Output   6857 ml  Net -4700.6 ml   Filed Weights   09/25/13 0500 09/26/13 0500 09/27/13 0500  Weight: 133.5 kg (294 lb 5 oz) 130.9 kg (288 lb 9.3 oz) 127.2 kg (280 lb 6.8 oz)    PHYSICAL EXAM CVP 12-14 General:Lying in bed.  NAD Neuro: Alert and oriented X 3. Moves all extremities spontaneous HEENT:  Normal  Neck: Supple without bruits. Chest L subclav PC. R subclav TLC. Unable to see JVD Lungs:  clear Heart: tachy regular. +s3 Abdomen: Soft, non-tender, non-distended, BS + x 4.  Extremities: 2+ edema. R foot. Warm Strength improved DP non palpable but dopplerable. R arm swelling improved Large, deep foul smelling wound in R groin with fat necrosis and surrounding cellulitis.   CBC  Recent Labs  09/26/13 0407 09/27/13 0408  WBC 7.4 9.2  HGB 10.5* 9.9*  HCT 33.6* 32.7*  MCV 95.7 95.9  PLT 251 342   Basic Metabolic Panel  Recent Labs  09/26/13 0407  09/27/13 0300 09/27/13 0408 09/27/13 0409  NA 135*  < >  135*  --  134*  K 4.5  < > 4.5  --  4.5  CL 98  < > 99  --  95*  CO2 26  < > 26  --  26  GLUCOSE 240*  < > 277*  --  267*  BUN 12  < > 17  --  14  CREATININE 2.04*  < > 2.07*  --  2.10*  CALCIUM 7.8*  < > 7.7*  --  7.7*  MG 2.5  --   --  2.4  --   PHOS 2.3  < > 2.0*  --  2.4  < > = values in this interval not displayed. Liver Function Tests  Recent Labs  09/27/13 0300 09/27/13 0409  ALBUMIN 1.7* 1.7*   Cardiac Enzymes No results found for this basename: CKTOTAL, CKMB, CKMBINDEX, TROPONINI,  in the last 72 hours Thyroid Function Tests No results found for this basename: TSH, T4TOTAL, FREET3, T3FREE, THYROIDAB,  in the last 72 hours  TELE  Sinus tachycardia 100-110   Radiology/Studies  Dg Chest 2 View  09/06/2013   CLINICAL DATA:   CHF, intermittent dyspnea  EXAM: CHEST  2 VIEW  COMPARISON:  Prior chest x-ray 08/13/2013; prior chest CT 08/22/2013  FINDINGS: Stable cardiac and mediastinal contours. Atherosclerotic calcifications are present within the transverse aorta. Moderate left and small right pleural effusions are similar compared to prior. There is persistent associated bibasilar atelectasis. Mild pulmonary vascular congestion without overt edema. No pneumothorax. No new focal airspace consolidation. No acute osseous abnormality.  IMPRESSION: 1. Stable moderate left and small right pleural effusions and associated bibasilar atelectasis. 2. Pulmonary vascular congestion without overt edema.   Electronically Signed   By: Jacqulynn Cadet M.D.   On: 09/06/2013 07:55   Ct Chest Wo Contrast  08/22/2013   CLINICAL DATA:  Left lung cancer status post lobectomy. Weight gain. Renal insufficiency.  EXAM: CT CHEST WITHOUT CONTRAST  TECHNIQUE: Multidetector CT imaging of the chest was performed following the standard protocol without IV contrast.  COMPARISON:  Radiographs 08/13/2013 and 08/04/2013.  CT 08/02/2012.  FINDINGS: There are stable postsurgical changes related to prior left lower lobe resection. There has been interval enlargement of several mediastinal lymph nodes. These include 12 mm right paratracheal (image 19), 13 mm precarinal (image 23), and 14 mm AP window (image 25) lymph nodes. Some of these nodes have retained fatty hila. Allowing for the limitations of noncontrast technique, the hila appear stable.  There is stable low-density within the right thyroid lobe. Atherosclerosis of the aorta, great vessels and coronary arteries is noted. The heart size is normal. There is no pericardial effusion.  Moderate size dependent pleural effusions are present bilaterally. There is associated dependent airspace disease in both lower lobes. In addition, there are more focal airspace opacities within the right lower lobe and inferior aspect  of the left upper lobe. The latter is somewhat nodular, measuring up to 1.8 cm on image 43. No other focal nodularity or endobronchial lesions are demonstrated.  The visualized upper abdomen has a stable appearance. There is no adrenal mass. There is increased subcutaneous edema throughout the subcutaneous fat, especially within the anterior aspect of the upper abdomen.  No worrisome osseous findings are demonstrated.  IMPRESSION: 1. As demonstrated radiographically, there are bilateral pleural effusions and bilateral airspace opacities which are new compared with the prior CT. Although there are focal somewhat nodular components in the right lower and left upper lobes, these findings are most likely infectious/inflammatory.  2. Progressive mediastinal lymphadenopathy. Some of the nodes have retained fatty hila and may be reactive. Metastatic disease cannot be completely excluded. 3. Stable atherosclerosis and low-density within the right thyroid lobe. 4. If the airspace opacities and pleural effusions fail to respond to appropriate clinical therapy, follow-up CT or PET-CT may be warranted to exclude metastatic disease.   Electronically Signed   By: Camie Patience M.D.   On: 08/22/2013 10:26   Dg Chest Portable 1 View  08/13/2013   CLINICAL DATA:  Shortness of breath increasing for 2 weeks. History of CHF.  EXAM: PORTABLE CHEST - 1 VIEW  COMPARISON:  08/04/2013  FINDINGS: Again noted are bibasilar lung densities that are suggestive for pleural effusions and atelectasis. Heart size appears to be enlarged. There is some peribronchial thickening and cannot exclude pulmonary edema.  IMPRESSION: Bilateral pleural effusions with basilar atelectasis. There is mild pulmonary edema. Minimal change from the previous examination.   Electronically Signed   By: Markus Daft M.D.   On: 08/13/2013 23:10    ASSESSMENT AND PLAN 1. Cardiogenic shock 2. Acute on chronic systolic HF     EF ~98-33% 3. 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 Acute  respiratory failure 5. 3-V CAD 6. Carotid stenosis     - doppler 08/31/2013 shows R 60-79% ICA stenosis, L 80-99% ICA stenosis     - vascular surgery aware 7. 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 DM1 9. Ischemic R foot s/p R femoral thrombectomy and 4-compartment fasciotomy.  10. V/VF arrest 6/3 11. R axillary DVT 12. Severe protein calorie malnutrition  13. Large right groin wound   Still on high-dose pressors but able to pull a bit more fluid with CVVHD. Co-ox improved. Continue midodrine. Can go to 10 tid as needed. Continue to wean levophed as tolerated.  Still volume overloaded. I would favor continuing CVVHD (vs intermittent HD) until all fluid removed.  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 Will continue. Will not start coumadin until after surgical debridement of groin wound. Wound cx. GNR. On vanc/zosyn  Wound care consult.    Benay Spice 7:31 PM

## 2013-09-27 NOTE — Progress Notes (Addendum)
  [facility] Progress Note  09/27/2013 7:48 AM 8 Days Post-Op  Subjective:  No complaints this am other than tenderness in right groin   Afebrile 07'M-226'J systolic HR 335'K-562'B 638% 2LO2NC  Gtts: Dobutamine Levophed  ABx: Vanc Zosyn  Filed Vitals:   09/27/13 0730  BP: 114/59  Pulse: 109  Temp: 98.1 F (36.7 C)  Resp: 10    Physical Exam: Incisions:  Right groin unchanged from yesterday-necrotic fat and tissue around the wound Extremities:  2+ DP right  CBC    Component Value Date/Time   WBC 9.2 09/27/2013 0408   RBC 3.41* 09/27/2013 0408   HGB 9.9* 09/27/2013 0408   HCT 32.7* 09/27/2013 0408   PLT 235 09/27/2013 0408   MCV 95.9 09/27/2013 0408   MCH 29.0 09/27/2013 0408   MCHC 30.3 09/27/2013 0408   RDW 18.3* 09/27/2013 0408   LYMPHSABS 1.4 08/04/2013 1223   MONOABS 0.4 08/04/2013 1223   EOSABS 0.2 08/04/2013 1223   BASOSABS 0.1 08/04/2013 1223    BMET    Component Value Date/Time   NA 134* 09/27/2013 0409   K 4.5 09/27/2013 0409   CL 95* 09/27/2013 0409   CO2 26 09/27/2013 0409   GLUCOSE 267* 09/27/2013 0409   BUN 14 09/27/2013 0409   CREATININE 2.10* 09/27/2013 0409   CREATININE 4.17* 07/06/2013 1618   CALCIUM 7.7* 09/27/2013 0409   GFRNONAA 27* 09/27/2013 0409   GFRNONAA 71 07/13/2011 0835   GFRAA 32* 09/27/2013 0409   GFRAA 82 07/13/2011 0835    INR    Component Value Date/Time   INR 2.58* 09/27/2013 0408     Intake/Output Summary (Last 24 hours) at 09/27/13 0748 Last data filed at 09/27/13 0700  Gross per 24 hour  Intake 2002.6 ml  Output   6738 ml  Net -4735.4 ml     Assessment:  43 y.o. female is s/p:  Right femoral endarterectomy, right femoral thrombectomy, four compartment fasciotomy 10 days post op And Closure of fasciotomies 8 Days Post-Op  Plan: -pt right groin wound unchanged from yesterday-for OR tomorrow for debridement and possible wound vac -DVT prophylaxis:  Argatroban.  D/c'd coumadin yesterday as pt is  going to the OR tomorrow morning.   -pt waiting to talk to Dr. Trula Slade before signing consent. -CRRT per renal-RN states they have gotten more fluid off last pm -malnourished-continue Nepro  -INR elevated, but on argatroban, which influences this result.   Leontine Locket, PA-C [facility] [phone] 09/27/2013 7:48 AM    Agree with above. Dr Trula Slade to discuss with pt preop anesthesia and extent of debridement.  Ruta Hinds, MD [facility] [place] Office: [phone] Pager: [phone]   I've seen and examined the patient.  We discussed proceeding with I&D of her right groin in the operating room tomorrow.  We will try to do this with IV sedation, however she may be required to be converted to general anesthesia.  I discussed the preop nonviable tissue and either packing with wet-to-dry gauze or placing a wound VAC.  She was in agreement with the plan.   Annamarie Major

## 2013-09-27 NOTE — Progress Notes (Signed)
Inpatient Diabetes Program Recommendations  AACE/ADA: New Consensus Statement on Inpatient Glycemic Control (2013)  Target Ranges:  Prepandial:   less than 140 mg/dL      Peak postprandial:   less than 180 mg/dL (1-2 hours)      Critically ill patients:  140 - 180 mg/dL   Reason for Assessment:  Results for LOVEDA, COLAIZZI (MRN [id number]) as of 09/27/2013 12:38  Ref. Range 09/26/2013 11:12 09/26/2013 15:34 09/26/2013 22:08 09/27/2013 07:37 09/27/2013 11:46  Glucose-Capillary Latest Range: 70-99 mg/dL 223 (H) 229 (H) 224 (H) 265 (H) 275 (H)   Diabetes history: Type 2 diabetes Outpatient Diabetes medications: Levemir 34 units q PM, Humalog 3 units tid with meals  Current orders for Inpatient glycemic control: Novolog moderate tid with meals and HS, Lantus 25 units daily  Consider decreasing Novolog to sensitive and increase frequency to q 4 hours once NPO. Also may consider increasing Lantus to 30 units daily.  Thanks, Adah Perl, RN, BC-ADM Inpatient Diabetes Coordinator Pager [phone]

## 2013-09-28 ENCOUNTER — Encounter (HOSPITAL_COMMUNITY): Payer: Medicare Other | Admitting: Anesthesiology

## 2013-09-28 ENCOUNTER — Inpatient Hospital Stay (HOSPITAL_COMMUNITY): Payer: Medicare Other | Admitting: Anesthesiology

## 2013-09-28 ENCOUNTER — Encounter (HOSPITAL_COMMUNITY)
Admission: EM | Disposition: A | Discharge status: Home Health | Payer: Self-pay | Source: Home / Self Care | Attending: Interventional Cardiology

## 2013-09-28 DIAGNOSIS — T8140XA Infection following a procedure, unspecified, initial encounter: Secondary | ICD-10-CM

## 2013-09-28 HISTORY — PX: APPLICATION OF WOUND VAC: SHX5189

## 2013-09-28 HISTORY — PX: I&D EXTREMITY: SHX5045

## 2013-09-28 LAB — WOUND CULTURE

## 2013-09-28 LAB — PROTIME-INR
INR: 2.59 — ABNORMAL HIGH (ref 0.00–1.49)
Prothrombin Time: 26.9 seconds — ABNORMAL HIGH (ref 11.6–15.2)

## 2013-09-28 LAB — RENAL FUNCTION PANEL
ALBUMIN: 1.7 g/dL — AB (ref 3.5–5.2)
Albumin: 1.7 g/dL — ABNORMAL LOW (ref 3.5–5.2)
BUN: 17 mg/dL (ref 6–23)
BUN: 18 mg/dL (ref 6–23)
CALCIUM: 7.9 mg/dL — AB (ref 8.4–10.5)
CALCIUM: 8 mg/dL — AB (ref 8.4–10.5)
CHLORIDE: 99 meq/L (ref 96–112)
CO2: 25 mEq/L (ref 19–32)
CO2: 24 mEq/L (ref 19–32)
CREATININE: 2.24 mg/dL — AB (ref 0.50–1.10)
Chloride: 97 mEq/L (ref 96–112)
Creatinine, Ser: 2.14 mg/dL — ABNORMAL HIGH (ref 0.50–1.10)
GFR calc Af Amer: 31 mL/min — ABNORMAL LOW (ref >90)
GFR calc Af Amer: 29 mL/min — ABNORMAL LOW (ref >90)
GFR, EST NON AFRICAN AMERICAN: 27 mL/min — AB (ref >90)
GFR, EST NON AFRICAN AMERICAN: 25 mL/min — AB (ref >90)
Glucose, Bld: 280 mg/dL — ABNORMAL HIGH (ref 70–99)
Glucose, Bld: 288 mg/dL — ABNORMAL HIGH (ref 70–99)
Phosphorus: 2.4 mg/dL (ref 2.3–4.6)
Phosphorus: 2.2 mg/dL — ABNORMAL LOW (ref 2.3–4.6)
Potassium: 4.8 mEq/L (ref 3.7–5.3)
Potassium: 5.2 mEq/L (ref 3.7–5.3)
Sodium: 134 mEq/L — ABNORMAL LOW (ref 137–147)
Sodium: 135 mEq/L — ABNORMAL LOW (ref 137–147)

## 2013-09-28 LAB — APTT
APTT: 77 s — AB (ref 24–37)
APTT: 61 s — AB (ref 24–37)

## 2013-09-28 LAB — GLUCOSE, CAPILLARY
GLUCOSE-CAPILLARY: 275 mg/dL — AB (ref 70–99)
Glucose-Capillary: 240 mg/dL — ABNORMAL HIGH (ref 70–99)
Glucose-Capillary: 253 mg/dL — ABNORMAL HIGH (ref 70–99)
Glucose-Capillary: 255 mg/dL — ABNORMAL HIGH (ref 70–99)

## 2013-09-28 LAB — CARBOXYHEMOGLOBIN
CARBOXYHEMOGLOBIN: 2.1 % — AB (ref 0.5–1.5)
Methemoglobin: 0.8 % (ref 0.0–1.5)
O2 SAT: 71.4 %
Total hemoglobin: 9.7 g/dL — ABNORMAL LOW (ref 12.0–16.0)

## 2013-09-28 LAB — POCT I-STAT, CHEM 8
BUN: 17 mg/dL (ref 6–23)
CHLORIDE: 99 meq/L (ref 96–112)
Calcium, Ion: 1.11 mmol/L — ABNORMAL LOW (ref 1.12–1.23)
Creatinine, Ser: 2.4 mg/dL — ABNORMAL HIGH (ref 0.50–1.10)
GLUCOSE: 269 mg/dL — AB (ref 70–99)
HEMATOCRIT: 29 % — AB (ref 36.0–46.0)
Hemoglobin: 9.9 g/dL — ABNORMAL LOW (ref 12.0–15.0)
Potassium: 4.7 mEq/L (ref 3.7–5.3)
Sodium: 137 mEq/L (ref 137–147)
TCO2: 23 mmol/L (ref 0–100)

## 2013-09-28 LAB — CBC
HCT: 31.6 % — ABNORMAL LOW (ref 36.0–46.0)
HEMOGLOBIN: 9.4 g/dL — AB (ref 12.0–15.0)
MCH: 28.8 pg (ref 26.0–34.0)
MCHC: 29.7 g/dL — ABNORMAL LOW (ref 30.0–36.0)
MCV: 96.9 fL (ref 78.0–100.0)
Platelets: 240 10*3/uL (ref 150–400)
RBC: 3.26 MIL/uL — ABNORMAL LOW (ref 3.87–5.11)
RDW: 18.2 % — ABNORMAL HIGH (ref 11.5–15.5)
WBC: 8.1 10*3/uL (ref 4.0–10.5)

## 2013-09-28 LAB — MAGNESIUM: Magnesium: 2.4 mg/dL (ref 1.5–2.5)

## 2013-09-28 SURGERY — IRRIGATION AND DEBRIDEMENT EXTREMITY
Anesthesia: General | Site: Groin | Laterality: Right

## 2013-09-28 MED ORDER — HYDROMORPHONE HCL PF 1 MG/ML IJ SOLN: 0.2500 mg | INTRAMUSCULAR | Status: DC | PRN

## 2013-09-28 MED ORDER — SODIUM CHLORIDE 0.9 % IV SOLN
INTRAVENOUS | Status: DC | PRN
  Administered 2013-09-28: 11:00:00 via INTRAVENOUS

## 2013-09-28 MED ORDER — VASOPRESSIN 20 UNIT/ML IJ SOLN
INTRAMUSCULAR | Status: DC | PRN
  Administered 2013-09-28 (×2): .16 [IU] via INTRAVENOUS

## 2013-09-28 MED ORDER — SUCCINYLCHOLINE CHLORIDE 20 MG/ML IJ SOLN
INTRAMUSCULAR | Status: DC | PRN
Start: 2013-09-28 — End: 2013-09-28
  Administered 2013-09-28: 100 mg via INTRAVENOUS

## 2013-09-28 MED ORDER — AMIODARONE HCL 200 MG PO TABS
200.0000 mg | ORAL_TABLET | Freq: Two times a day (BID) | ORAL | Status: DC
  Administered 2013-09-28 – 2013-09-29 (×2): 200 mg via ORAL
  Filled 2013-09-28 (×3): qty 1

## 2013-09-28 MED ORDER — MIDODRINE HCL 5 MG PO TABS
10.0000 mg | ORAL_TABLET | Freq: Three times a day (TID) | ORAL | Status: DC
  Administered 2013-09-28 – 2013-10-29 (×93): 10 mg via ORAL
  Filled 2013-09-28 (×99): qty 2

## 2013-09-28 MED ORDER — ETOMIDATE 2 MG/ML IV SOLN
INTRAVENOUS | Status: AC
  Filled 2013-09-28: qty 10

## 2013-09-28 MED ORDER — DEXTROSE 5 % IV SOLN
4000.0000 ug | INTRAVENOUS | Status: DC | PRN
  Administered 2013-09-28: 17 ug/min via INTRAVENOUS

## 2013-09-28 MED ORDER — INSULIN ASPART 100 UNIT/ML ~~LOC~~ SOLN
SUBCUTANEOUS | Status: AC
  Filled 2013-09-28: qty 1

## 2013-09-28 MED ORDER — ALTEPLASE 2 MG IJ SOLR
2.0000 mg | Freq: Once | INTRAMUSCULAR | Status: AC | PRN
  Filled 2013-09-28: qty 2

## 2013-09-28 MED ORDER — MIDAZOLAM HCL 5 MG/5ML IJ SOLN
INTRAMUSCULAR | Status: DC | PRN
  Administered 2013-09-28: 1 mg via INTRAVENOUS

## 2013-09-28 MED ORDER — MIDAZOLAM HCL 2 MG/2ML IJ SOLN
1.0000 mg | Freq: Once | INTRAMUSCULAR | Status: AC
  Administered 2013-09-28: 1 mg via INTRAVENOUS
  Filled 2013-09-28: qty 2

## 2013-09-28 MED ORDER — MIDAZOLAM HCL 2 MG/2ML IJ SOLN
INTRAMUSCULAR | Status: AC
  Filled 2013-09-28: qty 2

## 2013-09-28 MED ORDER — SODIUM CHLORIDE 0.9 % IV SOLN
INTRAVENOUS | Status: DC
  Administered 2013-09-28: 20:00:00 via INTRAVENOUS

## 2013-09-28 MED ORDER — 0.9 % SODIUM CHLORIDE (POUR BTL) OPTIME
TOPICAL | Status: DC | PRN
  Administered 2013-09-28: 1000 mL

## 2013-09-28 MED ORDER — PIPERACILLIN-TAZOBACTAM 3.375 G IVPB 30 MIN
3.3750 g | Freq: Three times a day (TID) | INTRAVENOUS | Status: DC
  Administered 2013-09-29 (×3): 3.375 g via INTRAVENOUS
  Filled 2013-09-28 (×4): qty 50

## 2013-09-28 MED ORDER — ANTICOAGULANT SODIUM CITRATE 4% (200MG/5ML) IV SOLN
5.0000 mL | Freq: Once | Status: AC
  Administered 2013-09-28: 5 mL via INTRAVENOUS
  Filled 2013-09-28: qty 250

## 2013-09-28 MED ORDER — FENTANYL CITRATE 0.05 MG/ML IJ SOLN
INTRAMUSCULAR | Status: AC
  Filled 2013-09-28: qty 5

## 2013-09-28 MED ORDER — ONDANSETRON HCL 4 MG/2ML IJ SOLN: 4.0000 mg | Freq: Once | INTRAMUSCULAR | Status: DC | PRN

## 2013-09-28 MED ORDER — DOBUTAMINE IN D5W 4-5 MG/ML-% IV SOLN
INTRAVENOUS | Status: DC | PRN
  Administered 2013-09-28: 5 ug/kg/min via INTRAVENOUS

## 2013-09-28 MED ORDER — LIDOCAINE HCL (CARDIAC) 20 MG/ML IV SOLN
INTRAVENOUS | Status: AC
  Filled 2013-09-28: qty 5

## 2013-09-28 MED ORDER — FENTANYL CITRATE 0.05 MG/ML IJ SOLN
INTRAMUSCULAR | Status: DC | PRN
  Administered 2013-09-28: 100 ug via INTRAVENOUS
  Administered 2013-09-28: 50 ug via INTRAVENOUS

## 2013-09-28 SURGICAL SUPPLY — 44 items
BANDAGE ELASTIC 4 VELCRO ST LF (GAUZE/BANDAGES/DRESSINGS) IMPLANT
BANDAGE ELASTIC 6 VELCRO ST LF (GAUZE/BANDAGES/DRESSINGS) IMPLANT
BANDAGE GAUZE ELAST BULKY 4 IN (GAUZE/BANDAGES/DRESSINGS) IMPLANT
BLADE 10 SAFETY STRL DISP (BLADE) ×3 IMPLANT
CANISTER SUCTION 2500CC (MISCELLANEOUS) ×3 IMPLANT
CLIP TI MEDIUM 6 (CLIP) ×3 IMPLANT
CLIP TI WIDE RED SMALL 6 (CLIP) ×3 IMPLANT
COVER SURGICAL LIGHT HANDLE (MISCELLANEOUS) ×3 IMPLANT
DRAPE EXTREMITY BILATERAL (DRAPE) IMPLANT
DRAPE EXTREMITY T 121X128X90 (DRAPE) IMPLANT
DRAPE U-SHAPE 76X120 STRL (DRAPES) IMPLANT
DRSG VAC ATS MED SENSATRAC (GAUZE/BANDAGES/DRESSINGS) ×3 IMPLANT
ELECT REM PT RETURN 9FT ADLT (ELECTROSURGICAL) ×3
ELECTRODE REM PT RTRN 9FT ADLT (ELECTROSURGICAL) ×1 IMPLANT
GAUZE XEROFORM 5X9 LF (GAUZE/BANDAGES/DRESSINGS) IMPLANT
GLOVE BIOGEL PI IND STRL 6.5 (GLOVE) ×1 IMPLANT
GLOVE BIOGEL PI IND STRL 7.5 (GLOVE) ×1 IMPLANT
GLOVE BIOGEL PI INDICATOR 6.5 (GLOVE) ×2
GLOVE BIOGEL PI INDICATOR 7.5 (GLOVE) ×2
GLOVE SURG SS PI 7.5 STRL IVOR (GLOVE) ×3 IMPLANT
GOWN STRL REUS W/ TWL LRG LVL3 (GOWN DISPOSABLE) ×2 IMPLANT
GOWN STRL REUS W/ TWL XL LVL3 (GOWN DISPOSABLE) ×1 IMPLANT
GOWN STRL REUS W/TWL LRG LVL3 (GOWN DISPOSABLE) ×4
GOWN STRL REUS W/TWL XL LVL3 (GOWN DISPOSABLE) ×2
KIT BASIN OR (CUSTOM PROCEDURE TRAY) ×3 IMPLANT
KIT ROOM TURNOVER OR (KITS) ×3 IMPLANT
NS IRRIG 1000ML POUR BTL (IV SOLUTION) ×3 IMPLANT
PACK CV ACCESS (CUSTOM PROCEDURE TRAY) IMPLANT
PACK GENERAL/GYN (CUSTOM PROCEDURE TRAY) IMPLANT
PACK UNIVERSAL I (CUSTOM PROCEDURE TRAY) IMPLANT
PAD ARMBOARD 7.5X6 YLW CONV (MISCELLANEOUS) ×6 IMPLANT
SPONGE GAUZE 4X4 12PLY (GAUZE/BANDAGES/DRESSINGS) ×3 IMPLANT
SUT ETHILON 3 0 PS 1 (SUTURE) IMPLANT
SUT SILK 2 0 (SUTURE) ×2
SUT SILK 2-0 18XBRD TIE 12 (SUTURE) ×1 IMPLANT
SUT SILK 3 0 (SUTURE) ×2
SUT SILK 3-0 18XBRD TIE 12 (SUTURE) ×1 IMPLANT
SUT VIC AB 2-0 CTX 36 (SUTURE) IMPLANT
SUT VIC AB 3-0 SH 27 (SUTURE)
SUT VIC AB 3-0 SH 27X BRD (SUTURE) IMPLANT
SUT VICRYL 4-0 PS2 18IN ABS (SUTURE) IMPLANT
TOWEL OR 17X24 6PK STRL BLUE (TOWEL DISPOSABLE) ×3 IMPLANT
TOWEL OR 17X26 10 PK STRL BLUE (TOWEL DISPOSABLE) ×3 IMPLANT
WATER STERILE IRR 1000ML POUR (IV SOLUTION) ×3 IMPLANT

## 2013-09-28 NOTE — Progress Notes (Signed)
ANTICOAGULATION CONSULT NOTE - Follow Up Consult  Pharmacy Consult:  Argatroban / Coumadin  Indication:  Thromboembolic state  Allergies  Allergen Reactions  . Crestor [Rosuvastatin] Other (See Comments)    Severe muscle weakness  . Nsaids Other (See Comments)    Not allergic, "bad on my kidneys"  . Ciprofloxacin Rash    Patient Measurements: Height: 5' 7.5" (171.5 cm) Weight: 272 lb 14.9 oz (123.8 kg) IBW/kg (Calculated) : 62.75  Vital Signs: Temp: 97.8 F (36.6 C) (06/12 1547) Temp src: Oral (06/12 1547) BP: 108/92 mmHg (06/12 1500) Pulse Rate: 101 (06/12 1515)  Labs:  Recent Labs  09/26/13 0407  09/27/13 0300 09/27/13 0408 09/27/13 0409 09/28/13 0340  HGB 10.5*  --   --  9.9*  --  9.4*  HCT 33.6*  --   --  32.7*  --  31.6*  PLT 251  --   --  235  --  240  APTT 88*  --   --  81*  --  77*  LABPROT 27.0*  --   --  26.8*  --  26.9*  INR 2.61*  --   --  2.58*  --  2.59*  CREATININE 2.04*  < > 2.07*  --  2.10* 2.14*  < > = values in this interval not displayed.  Estimated Creatinine Clearance: 45.7 ml/min (by C-G formula based on Cr of 2.14).  Assessment: 18 YOF with right foot ischemia s/p femoral endarterectomy and thrombectomy on 09/17/13.  S/p fasciotomy closure on 09/19/13. HIT panel negative. Now on CVVHD.  Argatroban therapeutic aPTT 77 sec on 0.33mcg/kg/min, CBC stable, no bleeding noted.  Argatroban off for OR this am ad restarted this afternoon.  Will recheck aPTT 4hr after restart to ensure back to goal.   . INR 2.6, but falsely elevated due to argatroban.  Will need 5 day overlap with warfarin and argatroban, and argatroban can be held when INR >= 4 and rechecked to determine actual INR.     I&D for wound infection 6/12 now with wound vac- hold warfarin until after vac off and I&D complete  Goal of Therapy:  aPTT 50 - 90 seconds Monitor platelets by anticoagulation protocol: Yes   Plan 1. Continue Argatroban at 0.4 mcg/kg/min 2. Daily aPTT   Bonnita Nasuti Pharm.D. CPP, BCPS Clinical Pharmacist [phone] 09/28/2013 4:07 PM

## 2013-09-28 NOTE — Progress Notes (Signed)
Assessment:  1. Dialysis dependent anuric AoCKD5, cardiogenic shock, cont CRRT--improved UF with current maneuvers 2. 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 S/p R femoral endarterectomy wound complications, thrombectomy , and fasciotomy 5. CAD, ischemic CM 6. Anemia 7. Twitching, possibly due to Zosyn 8.  Plan: Cont current UF rate, Albumin prn, ask pharmacy to dose Zosyn which may be causing twitching  Subjective: Interval History: Removing fluid. C/o twitching  Objective: Vital signs in last 24 hours: Temp:  [97.5 F (36.4 C)-97.9 F (36.6 C)] 97.8 F (36.6 C) (06/12 1547) Pulse Rate:  [38-118] 103 (06/12 1845) Resp:  [10-24] 17 (06/12 1845) BP: (45-109)/(31-92) 104/81 mmHg (06/12 1845) SpO2:  [79 %-100 %] 98 % (06/12 1845) Weight:  [123.8 kg (272 lb 14.9 oz)] 123.8 kg (272 lb 14.9 oz) (06/12 0600) Weight change: -3.4 kg (-7 lb 7.9 oz)  Intake/Output from previous day: 06/11 0701 - 06/12 0700 In: 2176.4 [P.O.:980; I.V.:746.4; IV Piggyback:450] Out: 9150  Intake/Output this shift: Total I/O In: 552.1 [I.V.:502.1; IV Piggyback:50] Out: 5697 [Other:1531]  General appearance: alert and cooperative Extremities: edema less  Lab Results:  Recent Labs  09/27/13 0408 09/28/13 0340 09/28/13 1624  WBC 9.2 8.1  --   HGB 9.9* 9.4* 9.9*  HCT 32.7* 31.6* 29.0*  PLT 235 240  --    BMET:  Recent Labs  09/28/13 0340 09/28/13 1600 09/28/13 1624  NA 134* 135* 137  K 4.8 5.2 4.7  CL 97 99 99  CO2 25 24  --   GLUCOSE 280* 288* 269*  BUN 17 18 17   CREATININE 2.14* 2.24* 2.40*  CALCIUM 7.9* 8.0*  --    No results found for this basename: PTH,  in the last 72 hours Iron Studies:  Recent Labs  09/26/13 1230  TRANSFERRIN 136*   Studies/Results: No results found.  Scheduled: . amiodarone  200 mg Oral BID  . aspirin  81 mg Oral Daily  . atorvastatin  80 mg Oral q1800  . calcitRIOL  0.25 mcg  Oral Daily  . darbepoetin (ARANESP) injection - NON-DIALYSIS  200 mcg Subcutaneous Q Wed-1800  . feeding supplement (NEPRO CARB STEADY)  237 mL Oral TID WC  . insulin aspart  0-15 Units Subcutaneous TID WC  . insulin aspart  0-5 Units Subcutaneous QHS  . insulin glargine  25 Units Subcutaneous Q24H  . lidocaine (PF)  5 mL Other Once  . midodrine  10 mg Oral TID WC  . pantoprazole  40 mg Oral Daily  . piperacillin-tazobactam  3.375 g Intravenous 4 times per day  . polyethylene glycol  17 g Oral Daily  . sodium chloride  10-40 mL Intracatheter Q12H  . vancomycin  1,250 mg Intravenous Q24H     LOS: 25 days   Hannah [name] C 09/28/2013,6:49 PM

## 2013-09-28 NOTE — Progress Notes (Addendum)
ADVANCED HEART FAILURE ROUNDING NOTE   SUBJECTIVE  43 yo morbidly obese female with PMH of DM1, stage V kidney disease, blindness, h/o lung CA s/p resection who was recently discharged for heart failure symptom had a high risk stress test which showed ischemia in anterior and apical region. She underwent cath 09/04/2013 which showed chronically occluded RCA and triple vessel dx. CT surgery consulted but not felt to be surgical candidate due to poor PFTs, suboptimal targets, comorbidities and shock. EF 22% by Myoview. 30-35% by echo  Underwent placement of swan and IABP last week for shock (co-ox 38%). Unfortunately developed cold foot and IABP had to be removed.Underwent R femoral thrombectomy and 4 compartment fasciotomy of RLE on 6/1.On 6/3 had VT/VF->PEA arrest 10 mins CPR. U/s shows R axillary DVT  Remains on Levophed at 15 (down from 35). Also on dobutamine 5. Continue with brisk volume removal. Underwent R groin wound debridement today with wound vac placement,   Co-ox 71% this am.     CURRENT MEDS . amiodarone  400 mg Oral BID  . aspirin  81 mg Oral Daily  . atorvastatin  80 mg Oral q1800  . calcitRIOL  0.25 mcg Oral Daily  . darbepoetin (ARANESP) injection - NON-DIALYSIS  200 mcg Subcutaneous Q Wed-1800  . feeding supplement (NEPRO CARB STEADY)  237 mL Oral TID WC  . insulin aspart  0-15 Units Subcutaneous TID WC  . insulin aspart  0-5 Units Subcutaneous QHS  . insulin glargine  25 Units Subcutaneous Q24H  . lidocaine (PF)  5 mL Other Once  . midodrine  7.5 mg Oral TID WC  . pantoprazole  40 mg Oral Daily  . piperacillin-tazobactam  3.375 g Intravenous 4 times per day  . polyethylene glycol  17 g Oral Daily  . sodium chloride  10-40 mL Intracatheter Q12H  . vancomycin  1,250 mg Intravenous Q24H    OBJECTIVE  Filed Vitals:   09/28/13 1430 09/28/13 1445 09/28/13 1500 09/28/13 1515  BP: 97/52 91/60 108/92   Pulse:   104 101  Temp:      TempSrc:      Resp: 12 15 20 13    Height:      Weight:      SpO2:   98% 98%    Intake/Output Summary (Last 24 hours) at 09/28/13 1516 Last data filed at 09/28/13 1500  Gross per 24 hour  Intake 1306.33 ml  Output   4841 ml  Net -3534.67 ml   Filed Weights   09/26/13 0500 09/27/13 0500 09/28/13 0600  Weight: 130.9 kg (288 lb 9.3 oz) 127.2 kg (280 lb 6.8 oz) 123.8 kg (272 lb 14.9 oz)    PHYSICAL EXAM General:Lying in bed.  NAD Neuro: Alert and oriented X 3. Moves all extremities spontaneous HEENT:  Normal  Neck: Supple without bruits. Chest L subclav PC. R subclav TLC. Unable to see JVD Lungs:  clear Heart: tachy regular. +s3 Abdomen: Soft, non-tender, non-distended, BS + x 4.  Extremities: 1-2+ edema. R foot. Warm Strength improved DP non palpable but dopplerable. R arm swelling improved Wound vac in R groin  CBC  Recent Labs  09/27/13 0408 09/28/13 0340  WBC 9.2 8.1  HGB 9.9* 9.4*  HCT 32.7* 31.6*  MCV 95.9 96.9  PLT 235 875   Basic Metabolic Panel  Recent Labs  09/27/13 0408 09/27/13 0409 09/28/13 0340  NA  --  134* 134*  K  --  4.5 4.8  CL  --  95* 97  CO2  --  26 25  GLUCOSE  --  267* 280*  BUN  --  14 17  CREATININE  --  2.10* 2.14*  CALCIUM  --  7.7* 7.9*  MG 2.4  --  2.4  PHOS  --  2.4 2.4   Liver Function Tests  Recent Labs  09/27/13 0409 09/28/13 0340  ALBUMIN 1.7* 1.7*   Cardiac Enzymes No results found for this basename: CKTOTAL, CKMB, CKMBINDEX, TROPONINI,  in the last 72 hours Thyroid Function Tests No results found for this basename: TSH, T4TOTAL, FREET3, T3FREE, THYROIDAB,  in the last 72 hours  TELE  Sinus tachycardia 100-110   Radiology/Studies  Dg Chest 2 View  09/06/2013   CLINICAL DATA:  CHF, intermittent dyspnea  EXAM: CHEST  2 VIEW  COMPARISON:  Prior chest x-ray 08/13/2013; prior chest CT 08/22/2013  FINDINGS: Stable cardiac and mediastinal contours. Atherosclerotic calcifications are present within the transverse aorta. Moderate left and small  right pleural effusions are similar compared to prior. There is persistent associated bibasilar atelectasis. Mild pulmonary vascular congestion without overt edema. No pneumothorax. No new focal airspace consolidation. No acute osseous abnormality.  IMPRESSION: 1. Stable moderate left and small right pleural effusions and associated bibasilar atelectasis. 2. Pulmonary vascular congestion without overt edema.   Electronically Signed   By: Jacqulynn Cadet M.D.   On: 09/06/2013 07:55   Ct Chest Wo Contrast  08/22/2013   CLINICAL DATA:  Left lung cancer status post lobectomy. Weight gain. Renal insufficiency.  EXAM: CT CHEST WITHOUT CONTRAST  TECHNIQUE: Multidetector CT imaging of the chest was performed following the standard protocol without IV contrast.  COMPARISON:  Radiographs 08/13/2013 and 08/04/2013.  CT 08/02/2012.  FINDINGS: There are stable postsurgical changes related to prior left lower lobe resection. There has been interval enlargement of several mediastinal lymph nodes. These include 12 mm right paratracheal (image 19), 13 mm precarinal (image 23), and 14 mm AP window (image 25) lymph nodes. Some of these nodes have retained fatty hila. Allowing for the limitations of noncontrast technique, the hila appear stable.  There is stable low-density within the right thyroid lobe. Atherosclerosis of the aorta, great vessels and coronary arteries is noted. The heart size is normal. There is no pericardial effusion.  Moderate size dependent pleural effusions are present bilaterally. There is associated dependent airspace disease in both lower lobes. In addition, there are more focal airspace opacities within the right lower lobe and inferior aspect of the left upper lobe. The latter is somewhat nodular, measuring up to 1.8 cm on image 43. No other focal nodularity or endobronchial lesions are demonstrated.  The visualized upper abdomen has a stable appearance. There is no adrenal mass. There is increased  subcutaneous edema throughout the subcutaneous fat, especially within the anterior aspect of the upper abdomen.  No worrisome osseous findings are demonstrated.  IMPRESSION: 1. As demonstrated radiographically, there are bilateral pleural effusions and bilateral airspace opacities which are new compared with the prior CT. Although there are focal somewhat nodular components in the right lower and left upper lobes, these findings are most likely infectious/inflammatory. 2. Progressive mediastinal lymphadenopathy. Some of the nodes have retained fatty hila and may be reactive. Metastatic disease cannot be completely excluded. 3. Stable atherosclerosis and low-density within the right thyroid lobe. 4. If the airspace opacities and pleural effusions fail to respond to appropriate clinical therapy, follow-up CT or PET-CT may be warranted to exclude metastatic disease.   Electronically Signed   By: Rush Landmark  Lin Landsman M.D.   On: 08/22/2013 10:26   Dg Chest Portable 1 View  08/13/2013   CLINICAL DATA:  Shortness of breath increasing for 2 weeks. History of CHF.  EXAM: PORTABLE CHEST - 1 VIEW  COMPARISON:  08/04/2013  FINDINGS: Again noted are bibasilar lung densities that are suggestive for pleural effusions and atelectasis. Heart size appears to be enlarged. There is some peribronchial thickening and cannot exclude pulmonary edema.  IMPRESSION: Bilateral pleural effusions with basilar atelectasis. There is mild pulmonary edema. Minimal change from the previous examination.   Electronically Signed   By: Markus Daft M.D.   On: 08/13/2013 23:10    ASSESSMENT AND PLAN 1. Cardiogenic shock 2. Acute on chronic systolic HF     EF ~85-27% 3. 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 Acute respiratory failure 5. 3-V CAD 6. Carotid stenosis     - doppler 08/31/2013 shows R 60-79% ICA stenosis, L 80-99% ICA stenosis     - vascular surgery aware 7. Lung CA  h/o LLL lobectomy (2 yr ago due to non-small cell carcinoma)      - PFT 09/05/2013 pre-FEV1  28%, pre-FVC 34%, pre-FEV1/FVC ratio 68 8. DM1 9. Ischemic R foot s/p R femoral thrombectomy and 4-compartment fasciotomy.  10. VT/VF arrest 6/3 11. R axillary DVT 12. Severe protein calorie malnutrition  13. Large right groin wound s/p debridement 6/12  Continues to do well with volume removal after addition of midodrine. Unable to titrate levophed down further at this point due to low BP. Will increase midodrine to 10 tid. Wean levophed as tolerated. She is aware that in order to be eligible for outpatient HD we will have to be able to wean off levophed and dobutamine completely. I am not sure this is possible.   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 Will continue. Will not start coumadin until ok with [facility]. INR already out. Will check with Pharmacy to see if this is related to argatroban.  Wound cx. GNR. On vanc/zosyn  Wound care consult ordered.   Continue medical therapy of CAD. No targets for CABG or PCI. No b-blocker or ACE due to shock.  Sherard Sutch,MD 3:16 PM

## 2013-09-28 NOTE — Consult Note (Signed)
WOC consulted for right groin, however [facility] plans to take this patient to OR for debridement and possible VAC placement today. Will not consult for this reason.  Appears [facility] is managing right groin wound.  Anneth Brunell [place] RN,CWOCN [phone]

## 2013-09-28 NOTE — Progress Notes (Signed)
Patient scheduled to go to OR per [facility], per pre-op orders, argatroban to be stopped on OR CALL, Paged Dr Florene Glen ([facility]) regarding HD Cath Site.  Need orders for what to use to lock HD cath off during surgery, awaiting return page.

## 2013-09-28 NOTE — H&P (View-Only) (Signed)
  [facility] Progress Note  09/27/2013 7:48 AM 8 Days Post-Op  Subjective:  No complaints this am other than tenderness in right groin   Afebrile 78'H-885'O systolic HR 277'A-128'N 867% 2LO2NC  Gtts: Dobutamine Levophed  ABx: Vanc Zosyn  Filed Vitals:   09/27/13 0730  BP: 114/59  Pulse: 109  Temp: 98.1 F (36.7 C)  Resp: 10    Physical Exam: Incisions:  Right groin unchanged from yesterday-necrotic fat and tissue around the wound Extremities:  2+ DP right  CBC    Component Value Date/Time   WBC 9.2 09/27/2013 0408   RBC 3.41* 09/27/2013 0408   HGB 9.9* 09/27/2013 0408   HCT 32.7* 09/27/2013 0408   PLT 235 09/27/2013 0408   MCV 95.9 09/27/2013 0408   MCH 29.0 09/27/2013 0408   MCHC 30.3 09/27/2013 0408   RDW 18.3* 09/27/2013 0408   LYMPHSABS 1.4 08/04/2013 1223   MONOABS 0.4 08/04/2013 1223   EOSABS 0.2 08/04/2013 1223   BASOSABS 0.1 08/04/2013 1223    BMET    Component Value Date/Time   NA 134* 09/27/2013 0409   K 4.5 09/27/2013 0409   CL 95* 09/27/2013 0409   CO2 26 09/27/2013 0409   GLUCOSE 267* 09/27/2013 0409   BUN 14 09/27/2013 0409   CREATININE 2.10* 09/27/2013 0409   CREATININE 4.17* 07/06/2013 1618   CALCIUM 7.7* 09/27/2013 0409   GFRNONAA 27* 09/27/2013 0409   GFRNONAA 71 07/13/2011 0835   GFRAA 32* 09/27/2013 0409   GFRAA 82 07/13/2011 0835    INR    Component Value Date/Time   INR 2.58* 09/27/2013 0408     Intake/Output Summary (Last 24 hours) at 09/27/13 0748 Last data filed at 09/27/13 0700  Gross per 24 hour  Intake 2002.6 ml  Output   6738 ml  Net -4735.4 ml     Assessment:  43 y.o. female is s/p:  Right femoral endarterectomy, right femoral thrombectomy, four compartment fasciotomy 10 days post op And Closure of fasciotomies 8 Days Post-Op  Plan: -pt right groin wound unchanged from yesterday-for OR tomorrow for debridement and possible wound vac -DVT prophylaxis:  Argatroban.  D/c'd coumadin yesterday as pt is  going to the OR tomorrow morning.   -pt waiting to talk to Dr. Trula Slade before signing consent. -CRRT per renal-RN states they have gotten more fluid off last pm -malnourished-continue Nepro  -INR elevated, but on argatroban, which influences this result.   Leontine Locket, PA-C [facility] [phone] 09/27/2013 7:48 AM    Agree with above. Dr Trula Slade to discuss with pt preop anesthesia and extent of debridement.  Ruta Hinds, MD [facility] [place] Office: [phone] Pager: [phone]   I've seen and examined the patient.  We discussed proceeding with I&D of her right groin in the operating room tomorrow.  We will try to do this with IV sedation, however she may be required to be converted to general anesthesia.  I discussed the preop nonviable tissue and either packing with wet-to-dry gauze or placing a wound VAC.  She was in agreement with the plan.   Annamarie Major

## 2013-09-28 NOTE — Progress Notes (Signed)
Patient has been experiencing twitching in her face and all extremities, typically twitching occurs with moving extremities, once patient stops moving, twitching subsides.  MD aware,  Will obtain Ionized Ca on an ISTAT

## 2013-09-28 NOTE — Progress Notes (Addendum)
ANTIBIOTIC CONSULT NOTE - INITIAL  Pharmacy Consult for zosyn Indication: groin wound infection    Allergies  Allergen Reactions  . Crestor [Rosuvastatin] Other (See Comments)    Severe muscle weakness  . Nsaids Other (See Comments)    Not allergic, "bad on my kidneys"  . Ciprofloxacin Rash    Patient Measurements: Height: 5' 7.5" (171.5 cm) Weight: 272 lb 14.9 oz (123.8 kg) IBW/kg (Calculated) : 62.75  Vital Signs: Temp: 97.8 F (36.6 C) (06/12 1547) Temp src: Oral (06/12 1547) BP: 104/81 mmHg (06/12 1845) Pulse Rate: 103 (06/12 1845) Intake/Output from previous day: 06/11 0701 - 06/12 0700 In: 2176.4 [P.O.:980; I.V.:746.4; IV Piggyback:450] Out: 6878  Intake/Output from this shift:    Labs:  Recent Labs  09/26/13 0407  09/27/13 0408  09/28/13 0340 09/28/13 1600 09/28/13 1624  WBC 7.4  --  9.2  --  8.1  --   --   HGB 10.5*  --  9.9*  --  9.4*  --  9.9*  PLT 251  --  235  --  240  --   --   CREATININE 2.04*  < >  --   < > 2.14* 2.24* 2.40*  < > = values in this interval not displayed. Estimated Creatinine Clearance: 40.7 ml/min (by C-G formula based on Cr of 2.4). No results found for this basename: VANCOTROUGH, Corlis Leak, VANCORANDOM, GENTTROUGH, GENTPEAK, GENTRANDOM, TOBRATROUGH, TOBRAPEAK, TOBRARND, AMIKACINPEAK, AMIKACINTROU, AMIKACIN,  in the last 72 hours   Microbiology: Recent Results (from the past 720 hour(s))  SURGICAL PCR SCREEN     Status: None   Collection Time    09/05/13  6:21 PM      Result Value Ref Range Status   MRSA, PCR NEGATIVE  NEGATIVE Final   Staphylococcus aureus NEGATIVE  NEGATIVE Final   Comment:            The Xpert SA Assay (FDA     approved for NASAL specimens     in patients over 58 years of age),     is one component of     a comprehensive surveillance     program.  Test performance has     been validated by [employer] for patients greater     than or equal to 57 year old.     It is not intended     to diagnose  infection nor to     guide or monitor treatment.  CULTURE, BLOOD (ROUTINE X 2)     Status: None   Collection Time    09/19/13  9:25 PM      Result Value Ref Range Status   Specimen Description BLOOD HEMODIALYSIS CATHETER   Final   Special Requests BOTTLES DRAWN AEROBIC ONLY 10CC   Final   Culture  Setup Time     Final   Value: 09/20/2013 00:44     Performed at [facility]   Culture     Final   Value: NO GROWTH 5 DAYS     Performed at [facility]   Report Status 09/26/2013 FINAL   Final  CULTURE, BLOOD (ROUTINE X 2)     Status: None   Collection Time    09/19/13  9:35 PM      Result Value Ref Range Status   Specimen Description BLOOD LEFT HAND   Final   Special Requests BOTTLES DRAWN AEROBIC ONLY 5CC   Final   Culture  Setup Time  Final   Value: 09/20/2013 00:43     Performed at [facility]   Culture     Final   Value: NO GROWTH 5 DAYS     Performed at [facility]   Report Status 09/26/2013 FINAL   Final  WOUND CULTURE     Status: None   Collection Time    09/26/13 11:17 AM      Result Value Ref Range Status   Specimen Description WOUND RIGHT LEG   Final   Special Requests NONE   Final   Gram Stain     Final   Value: MODERATE WBC PRESENT,BOTH PMN AND MONONUCLEAR     NO SQUAMOUS EPITHELIAL CELLS SEEN     MODERATE GRAM NEGATIVE RODS     FEW GRAM POSITIVE COCCI     IN PAIRS FEW GRAM POSITIVE RODS   Culture     Final   Value: MULTIPLE ORGANISMS PRESENT, NONE PREDOMINANT     Note: NO STAPHYLOCOCCUS AUREUS ISOLATED NO GROUP A STREP (S.PYOGENES) ISOLATED     Performed at [facility]   Report Status 09/28/2013 FINAL   Final    Medical History: Past Medical History  Diagnosis Date  . High cholesterol   . Diabetic retinopathy   . Peripheral neuropathy     "tips of toes"  . Blind right eye   . CHF (congestive heart failure)   . CAD (coronary artery disease)   . GERD (gastroesophageal reflux disease)   . Hypertension   .  History of lung cancer 07/2011    s/p left lower lobectomy  . Diabetes mellitus     IDDM  . Cataract of right eye   . Ulcer of toe of right foot 07/10/2012    great toe  . Breast calcification, left 06/2012  . Acute biphenotypic leukemia   . CKD (chronic kidney disease) stage 5, GFR less than 15 ml/min   . Nephrotic syndrome   . Gastritis     H/o gastritis on prior endoscopy  . Anemia   . Carotid artery disease     Medications:  Scheduled:  . amiodarone  200 mg Oral BID  . aspirin  81 mg Oral Daily  . atorvastatin  80 mg Oral q1800  . calcitRIOL  0.25 mcg Oral Daily  . darbepoetin (ARANESP) injection - NON-DIALYSIS  200 mcg Subcutaneous Q Wed-1800  . feeding supplement (NEPRO CARB STEADY)  237 mL Oral TID WC  . insulin aspart  0-15 Units Subcutaneous TID WC  . insulin aspart  0-5 Units Subcutaneous QHS  . insulin glargine  25 Units Subcutaneous Q24H  . lidocaine (PF)  5 mL Other Once  . midodrine  10 mg Oral TID WC  . pantoprazole  40 mg Oral Daily  . piperacillin-tazobactam  3.375 g Intravenous 4 times per day  . polyethylene glycol  17 g Oral Daily  . sodium chloride  10-40 mL Intracatheter Q12H  . vancomycin  1,250 mg Intravenous Q24H    Assessment: 43 yo female with infected right groin and now noted with twitching possibly due to zosyn and pharmacy has been asked to assess Zosyn dosing. Patient on noted on CRRT with current zosyn dosing of 3.375gm IV q6h.  If the twitching is a seizure - Zosyn does have some case reports of seizure but the incidence appears to be low. Other broad antibioitic options were assessed (Cefepime, Fortaz, Unasyn, meropenem) but all have some reports of possible seizure (alternatives appear limited).  Plan:  -Will change zosyn to a lower dosage of 3.375gm IV q8h -Will follow patient progress  Hildred Laser, Sherian Rein D 09/28/2013 7:33 PM

## 2013-09-28 NOTE — Interval H&P Note (Signed)
History and Physical Interval Note:  09/28/2013 7:49 AM  Hannah [name]  has presented today for surgery, with the diagnosis of Chronic kidney disease, stage V   The various methods of treatment have been discussed with the patient and family. After consideration of risks, benefits and other options for treatment, the patient has consented to  Procedure(s): IRRIGATION AND DEBRIDEMENT EXTREMITY (Right) APPLICATION OF WOUND VAC (Right) as a surgical intervention .  The patient's history has been reviewed, patient examined, no change in status, stable for surgery.  I have reviewed the patient's chart and labs.  Questions were answered to the patient's satisfaction.     BRABHAM IV, V. WELLS

## 2013-09-28 NOTE — Progress Notes (Signed)
2nd Page to Renal MD for orders regarding HD Cath

## 2013-09-28 NOTE — Anesthesia Procedure Notes (Signed)
Procedure Name: Intubation Date/Time: 09/28/2013 11:17 AM Performed by: Izora Gala Pre-anesthesia Checklist: Patient identified, Emergency Drugs available, Suction available and Patient being monitored Patient Re-evaluated:Patient Re-evaluated prior to inductionOxygen Delivery Method: Circle system utilized Preoxygenation: Pre-oxygenation with 100% oxygen Intubation Type: IV induction Ventilation: Mask ventilation without difficulty Laryngoscope Size: Miller and 3 Grade View: Grade I Tube type: Oral Tube size: 7.5 mm Number of attempts: 1 Airway Equipment and Method: Stylet and LTA kit utilized Placement Confirmation: ETT inserted through vocal cords under direct vision,  positive ETCO2 and CO2 detector Secured at: 22 cm Tube secured with: Tape Dental Injury: Teeth and Oropharynx as per pre-operative assessment

## 2013-09-28 NOTE — Progress Notes (Signed)
Patient still experiencing twitching in her face, around her lip, and twitching in extremities when moving. Called Dr. Haroldine Laws, updated him, received orders to give 1 mg Versed to help ease twitching, will monitor.

## 2013-09-28 NOTE — Op Note (Signed)
    Patient name: Hannah [name] MRN: [id number] DOB: [birth year] Sex: female  09/03/2013 - 09/28/2013 Pre-operative Diagnosis: infected right groin Post-operative diagnosis:  Same Surgeon:  Eldridge Abrahams Assistants:  Lennie Muckle Procedure:   I&D in the right groin, including skin, subcutaneous tissue   Placement of wound VAC Anesthesia:  Gen. Blood Loss:  See anesthesia record Specimens:  none  Findings:  Tissue coverage anterior to the artery was intact.  There was fatty necrosis which involved the skin extending up onto the anterior abdominal wall  Indications:  The patient has previously undergone right femoral artery exposure and intervention for an ischemic leg.  She developed wound complications requiring operative debridement  Procedure:  The patient was identified in the holding area and taken to [place] [facility]  The patient was then placed supine on the table. general anesthesia was administered.  The patient was prepped and draped in the usual sterile fashion.  A time out was called and antibiotics were administered.  The previously placed Vicryl sutures were removed.  The wound was irrigated.  There appeared to be intact tissue coverage anterior to the femoral artery.  I resected a fairly large amount of skin which was necrotic extending up onto the anterior abdominal wall.  I also debrided back all necrotic tissue.  There was adequate bleeding within the wound.  Hemostasis was achieved.  It was again irrigated.  A wound VAC was placed.   Disposition:  To PACU in stable condition.   Theotis Burrow, M.D. [facility] Office: [phone] Pager:  [phone]

## 2013-09-28 NOTE — Anesthesia Preprocedure Evaluation (Addendum)
Anesthesia Evaluation  Patient identified by MRN, date of birth, ID band Patient awake    Reviewed: Allergy & Precautions, H&P , NPO status , Patient's Chart, lab work & pertinent test results  Airway Mallampati: II TM Distance: >3 FB Neck ROM: Full    Dental  (+) Teeth Intact, Dental Advisory Given   Pulmonary former smoker,  breath sounds clear to auscultation        Cardiovascular hypertension, + angina at rest + CAD, + Past MI, + Peripheral Vascular Disease and +CHF Rhythm:Regular Rate:Normal     Neuro/Psych    GI/Hepatic GERD-  Medicated,  Endo/Other  diabetes, Type 2  Renal/GU ESRFRenal disease     Musculoskeletal   Abdominal (+) + obese,   Peds  Hematology   Anesthesia Other Findings   Reproductive/Obstetrics                          Anesthesia Physical Anesthesia Plan  ASA: III  Anesthesia Plan: General   Post-op Pain Management:    Induction: Intravenous  Airway Management Planned: Oral ETT  Additional Equipment:   Intra-op Plan:   Post-operative Plan: Possible Post-op intubation/ventilation  Informed Consent: I have reviewed the patients History and Physical, chart, labs and discussed the procedure including the risks, benefits and alternatives for the proposed anesthesia with the patient or authorized representative who has indicated his/her understanding and acceptance.   Dental advisory given  Plan Discussed with: CRNA and Anesthesiologist  Anesthesia Plan Comments: (R. Femoral artery injury following IABP insertion, S/P repair now with wound dehiscence CAD with severe ischemic cardiomyopathy with cardiogenic shock requiring dobutamine and levophed for BP support Renal failure on CVVH K- 4.8 Type 2 DM glucose 240   Roberts Gaudy  )        Anesthesia Quick Evaluation

## 2013-09-28 NOTE — Transfer of Care (Signed)
Immediate Anesthesia Transfer of Care Note  Patient: Hannah [name]  Procedure(s) Performed: Procedure(s): IRRIGATION AND DEBRIDEMENT EXTREMITY (Right) APPLICATION OF WOUND VAC (Right)  Patient Location: SICU  Anesthesia Type:General  Level of Consciousness: awake, alert , oriented and patient cooperative  Airway & Oxygen Therapy: Patient Spontanous Breathing, Patient connected to nasal cannula oxygen and Patient connected to face mask oxygen  Post-op Assessment: Report given to PACU RN, Post -op Vital signs reviewed and stable and Patient moving all extremities  Post vital signs: Reviewed and stable  Complications: No apparent anesthesia complications

## 2013-09-28 NOTE — Anesthesia Postprocedure Evaluation (Signed)
  Anesthesia Post-op Note  Patient: Hannah [name]  Procedure(s) Performed: Procedure(s): IRRIGATION AND DEBRIDEMENT EXTREMITY (Right) APPLICATION OF WOUND VAC (Right)  Patient Location: PACU  Anesthesia Type:General  Level of Consciousness: awake, alert  and oriented  Airway and Oxygen Therapy: Patient Spontanous Breathing and Patient connected to nasal cannula oxygen  Post-op Pain: mild  Post-op Assessment: Post-op Vital signs reviewed, Patient's Cardiovascular Status Stable, Respiratory Function Stable, Patent Airway and Pain level controlled  Post-op Vital Signs: stable  Last Vitals:  Filed Vitals:   09/28/13 1515  BP:   Pulse: 101  Temp:   Resp: 13    Complications: No apparent anesthesia complications

## 2013-09-29 DIAGNOSIS — I998 Other disorder of circulatory system: Secondary | ICD-10-CM

## 2013-09-29 DIAGNOSIS — I472 Ventricular tachycardia, unspecified: Secondary | ICD-10-CM

## 2013-09-29 LAB — RENAL FUNCTION PANEL
Albumin: 1.8 g/dL — ABNORMAL LOW (ref 3.5–5.2)
Albumin: 1.9 g/dL — ABNORMAL LOW (ref 3.5–5.2)
BUN: 16 mg/dL (ref 6–23)
BUN: 19 mg/dL (ref 6–23)
CALCIUM: 8 mg/dL — AB (ref 8.4–10.5)
CALCIUM: 8.1 mg/dL — AB (ref 8.4–10.5)
CO2: 24 mEq/L (ref 19–32)
CO2: 24 meq/L (ref 19–32)
CREATININE: 2.09 mg/dL — AB (ref 0.50–1.10)
CREATININE: 2.18 mg/dL — AB (ref 0.50–1.10)
Chloride: 97 mEq/L (ref 96–112)
Chloride: 94 mEq/L — ABNORMAL LOW (ref 96–112)
GFR calc Af Amer: 30 mL/min — ABNORMAL LOW (ref >90)
GFR calc non Af Amer: 26 mL/min — ABNORMAL LOW (ref >90)
GFR, EST AFRICAN AMERICAN: 32 mL/min — AB (ref >90)
GFR, EST NON AFRICAN AMERICAN: 27 mL/min — AB (ref >90)
Glucose, Bld: 275 mg/dL — ABNORMAL HIGH (ref 70–99)
Glucose, Bld: 388 mg/dL — ABNORMAL HIGH (ref 70–99)
PHOSPHORUS: 2.3 mg/dL (ref 2.3–4.6)
PHOSPHORUS: 1.5 mg/dL — AB (ref 2.3–4.6)
Potassium: 4.7 mEq/L (ref 3.7–5.3)
Potassium: 4.6 mEq/L (ref 3.7–5.3)
SODIUM: 134 meq/L — AB (ref 137–147)
Sodium: 132 mEq/L — ABNORMAL LOW (ref 137–147)

## 2013-09-29 LAB — CARBOXYHEMOGLOBIN
CARBOXYHEMOGLOBIN: 2.6 % — AB (ref 0.5–1.5)
METHEMOGLOBIN: 0.8 % (ref 0.0–1.5)
O2 SAT: 76.2 %
TOTAL HEMOGLOBIN: 9.1 g/dL — AB (ref 12.0–16.0)

## 2013-09-29 LAB — CBC
HEMATOCRIT: 30.5 % — AB (ref 36.0–46.0)
Hemoglobin: 8.9 g/dL — ABNORMAL LOW (ref 12.0–15.0)
MCH: 28.5 pg (ref 26.0–34.0)
MCHC: 29.2 g/dL — ABNORMAL LOW (ref 30.0–36.0)
MCV: 97.8 fL (ref 78.0–100.0)
Platelets: 260 10*3/uL (ref 150–400)
RBC: 3.12 MIL/uL — AB (ref 3.87–5.11)
RDW: 18 % — ABNORMAL HIGH (ref 11.5–15.5)
WBC: 8.5 10*3/uL (ref 4.0–10.5)

## 2013-09-29 LAB — PROTIME-INR
INR: 2.17 — ABNORMAL HIGH (ref 0.00–1.49)
Prothrombin Time: 23.5 seconds — ABNORMAL HIGH (ref 11.6–15.2)

## 2013-09-29 LAB — GLUCOSE, CAPILLARY
GLUCOSE-CAPILLARY: 361 mg/dL — AB (ref 70–99)
Glucose-Capillary: 256 mg/dL — ABNORMAL HIGH (ref 70–99)
Glucose-Capillary: 203 mg/dL — ABNORMAL HIGH (ref 70–99)
Glucose-Capillary: 473 mg/dL — ABNORMAL HIGH (ref 70–99)

## 2013-09-29 LAB — VANCOMYCIN, TROUGH: VANCOMYCIN TR: 25.7 ug/mL — AB (ref 10.0–20.0)

## 2013-09-29 LAB — APTT: APTT: 61 s — AB (ref 24–37)

## 2013-09-29 LAB — MAGNESIUM: Magnesium: 2.4 mg/dL (ref 1.5–2.5)

## 2013-09-29 MED ORDER — VANCOMYCIN HCL IN DEXTROSE 1-5 GM/200ML-% IV SOLN
1000.0000 mg | INTRAVENOUS | Status: DC
  Administered 2013-09-29 – 2013-10-03 (×5): 1000 mg via INTRAVENOUS
  Filled 2013-09-29 (×6): qty 200

## 2013-09-29 MED ORDER — PIPERACILLIN-TAZOBACTAM 3.375 G IVPB
3.3750 g | Freq: Three times a day (TID) | INTRAVENOUS | Status: DC
  Administered 2013-09-30 – 2013-10-03 (×10): 3.375 g via INTRAVENOUS
  Filled 2013-09-29 (×12): qty 50

## 2013-09-29 MED ORDER — INSULIN GLARGINE 100 UNIT/ML ~~LOC~~ SOLN
30.0000 [IU] | SUBCUTANEOUS | Status: DC
  Administered 2013-09-30: 30 [IU] via SUBCUTANEOUS
  Filled 2013-09-29: qty 0.3

## 2013-09-29 NOTE — Progress Notes (Signed)
Assessment:  1. Dialysis dependent anuric AoCKD5, cardiogenic shock, cont CRRT--improved UF with current maneuvers 2. 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 S/p R femoral endarterectomy wound complications, thrombectomy , and fasciotomy 5. CAD, ischemic CM 6. Anemia 7. Twitching, possibly due to Zosyn PlaPlan: Cont current UF rate, I have no objection to weaning pressors on CRRT   Subjective: Interval History: Removing fluid Objective: Vital signs in last 24 hours: Temp:  [97.6 F (36.4 C)-98.1 F (36.7 C)] 98 F (36.7 C) (06/13 0801) Pulse Rate:  [69-111] 102 (06/13 0845) Resp:  [10-25] 18 (06/13 0900) BP: (45-124)/(31-92) 92/54 mmHg (06/13 0900) SpO2:  [79 %-100 %] 100 % (06/13 0845) Weight:  [121.7 kg (268 lb 4.8 oz)] 121.7 kg (268 lb 4.8 oz) (06/13 0600) Weight change: -2.1 kg (-4 lb 10.1 oz)  Intake/Output from previous day: 06/12 0701 - 06/13 0700 In: 1160.6 [I.V.:1060.6; IV Piggyback:100] Out: 4595 [Drains:50] Intake/Output this shift: Total I/O In: 72 [I.V.:72] Out: 673 [Other:673]  General appearance: alert and cooperative Resp: clear to auscultation bilaterally Chest wall: no tenderness Cardio: regular rate and rhythm, S1, S2 normal, no murmur, click, rub or gallop Extremities: edema improving  Lab Results:  Recent Labs  09/28/13 0340 09/28/13 1624 09/29/13 0330  WBC 8.1  --  8.5  HGB 9.4* 9.9* 8.9*  HCT 31.6* 29.0* 30.5*  PLT 240  --  260   BMET:  Recent Labs  09/28/13 1600 09/28/13 1624 09/29/13 0330  NA 135* 137 134*  K 5.2 4.7 4.7  CL 99 99 97  CO2 24  --  24  GLUCOSE 288* 269* 275*  BUN 18 17 16   CREATININE 2.24* 2.40* 2.09*  CALCIUM 8.0*  --  8.0*   No results found for this basename: PTH,  in the last 72 hours Iron Studies:  Recent Labs  09/26/13 1230  TRANSFERRIN 136*   Studies/Results: No results found.  Scheduled: . amiodarone  200 mg Oral BID  .  aspirin  81 mg Oral Daily  . atorvastatin  80 mg Oral q1800  . calcitRIOL  0.25 mcg Oral Daily  . darbepoetin (ARANESP) injection - NON-DIALYSIS  200 mcg Subcutaneous Q Wed-1800  . feeding supplement (NEPRO CARB STEADY)  237 mL Oral TID WC  . insulin aspart  0-15 Units Subcutaneous TID WC  . insulin aspart  0-5 Units Subcutaneous QHS  . insulin glargine  25 Units Subcutaneous Q24H  . lidocaine (PF)  5 mL Other Once  . midodrine  10 mg Oral TID WC  . pantoprazole  40 mg Oral Daily  . piperacillin-tazobactam  3.375 g Intravenous 3 times per day  . polyethylene glycol  17 g Oral Daily  . sodium chloride  10-40 mL Intracatheter Q12H  . vancomycin  1,250 mg Intravenous Q24H     LOS: 26 days   Matilde Pottenger C 09/29/2013,9:17 AM

## 2013-09-29 NOTE — Progress Notes (Signed)
Patient ID: Hannah [name], female   DOB: 1970-06-02, 43 y.o.   MRN: [id number]    SUBJECTIVE: Patient is stable this morning. She is currently on hemodialysis. There has been success drying off some fluid, although this is done with the patient on continued Levothroid and dopamine. She denied having any chest pain. Her blood pressure is being With a MAP in the range of 65.   Filed Vitals:   09/29/13 0630 09/29/13 0645 09/29/13 0700 09/29/13 0715  BP: 94/64 98/49 99/51  97/52  Pulse: 101 101 102 102  Temp:      TempSrc:      Resp: 17 20 17 14   Height:      Weight:      SpO2: 100% 100% 100% 100%     Intake/Output Summary (Last 24 hours) at 09/29/13 0744 Last data filed at 09/29/13 0730  Gross per 24 hour  Intake 1160.63 ml  Output   4595 ml  Net -3434.37 ml    LABS: Basic Metabolic Panel:  Recent Labs  09/28/13 0340 09/28/13 1600 09/28/13 1624 09/29/13 0330  NA 134* 135* 137 134*  K 4.8 5.2 4.7 4.7  CL 97 99 99 97  CO2 25 24  --  24  GLUCOSE 280* 288* 269* 275*  BUN 17 18 17 16   CREATININE 2.14* 2.24* 2.40* 2.09*  CALCIUM 7.9* 8.0*  --  8.0*  MG 2.4  --   --  2.4  PHOS 2.4 2.2*  --  2.3   Liver Function Tests:  Recent Labs  09/28/13 1600 09/29/13 0330  ALBUMIN 1.7* 1.8*   No results found for this basename: LIPASE, AMYLASE,  in the last 72 hours CBC:  Recent Labs  09/28/13 0340 09/28/13 1624 09/29/13 0330  WBC 8.1  --  8.5  HGB 9.4* 9.9* 8.9*  HCT 31.6* 29.0* 30.5*  MCV 96.9  --  97.8  PLT 240  --  260   Cardiac Enzymes: No results found for this basename: CKTOTAL, CKMB, CKMBINDEX, TROPONINI,  in the last 72 hours BNP: No components found with this basename: POCBNP,  D-Dimer: No results found for this basename: DDIMER,  in the last 72 hours Hemoglobin A1C: No results found for this basename: HGBA1C,  in the last 72 hours Fasting Lipid Panel: No results found for this basename: CHOL, HDL, LDLCALC, TRIG, CHOLHDL, LDLDIRECT,  in the last 72  hours Thyroid Function Tests: No results found for this basename: TSH, T4TOTAL, FREET3, T3FREE, THYROIDAB,  in the last 72 hours  RADIOLOGY: Dg Chest 1 View  09/14/2013   CLINICAL DATA:  Check aortic balloon pump  EXAM: CHEST - 1 VIEW  COMPARISON:  09/12/2013  FINDINGS: Cardiac shadow is stable. The left dialysis catheter is stable in the proximal superior vena cava. The right jugular temporary dialysis catheter has been removed. A new right subclavian sheath with Swan-Ganz catheter deep in the right pulmonary artery is noted. And intra-aortic balloon pump is noted superimposed over the aortic knob. The tip of the balloon pump extends beyond the room calcification of the thoracic aortic arch. Clinical correlation is recommended. Mild increased density is noted in the bases bilaterally but related to posteriorly of an effusion. Minimal left lower lobe atelectasis is seen.  IMPRESSION: Tubes and lines as described above. The tip of the balloon pump extends beyond in the margin of the room calcification in the thoracic aortic arch. Clinical correlation is recommended.  Small bilateral pleural effusions as well as left retrocardiac atelectasis.  Electronically Signed   By: Inez Catalina M.D.   On: 09/14/2013 16:13   Dg Chest 2 View  09/06/2013   CLINICAL DATA:  CHF, intermittent dyspnea  EXAM: CHEST  2 VIEW  COMPARISON:  Prior chest x-ray 08/13/2013; prior chest CT 08/22/2013  FINDINGS: Stable cardiac and mediastinal contours. Atherosclerotic calcifications are present within the transverse aorta. Moderate left and small right pleural effusions are similar compared to prior. There is persistent associated bibasilar atelectasis. Mild pulmonary vascular congestion without overt edema. No pneumothorax. No new focal airspace consolidation. No acute osseous abnormality.  IMPRESSION: 1. Stable moderate left and small right pleural effusions and associated bibasilar atelectasis. 2. Pulmonary vascular congestion  without overt edema.   Electronically Signed   By: Jacqulynn Cadet M.D.   On: 09/06/2013 07:55   Dg Chest Port 1 View  09/20/2013   CLINICAL DATA:  Central line insertion.  EXAM: PORTABLE CHEST - 1 VIEW  COMPARISON:  09/19/2013.  FINDINGS: Right subclavian central line tip projects over the SVC. Left IJ dialysis catheter tip projects over the lower SVC. Heart size stable. Mild diffuse interstitial prominence and indistinctness with overall improvement in aeration from 09/19/2013. No pneumothorax. Small bilateral effusions.  IMPRESSION: 1. Right subclavian central line placement without complicating feature. 2. Improving congestive heart failure and left lower lobe collapse/consolidation.   Electronically Signed   By: Lorin Picket M.D.   On: 09/20/2013 16:57   Dg Chest Port 1 View  09/19/2013   CLINICAL DATA:  Evaluate endotracheal tube placement.  EXAM: PORTABLE CHEST - 1 VIEW  COMPARISON:  09/18/2013  FINDINGS: Endotracheal tube tip lies 3.7 cm above the chronic, well positioned. Nasogastric tube has been removed.  Left internal jugular dual-lumen tunneled central venous catheter is stable with its tip in the lower superior vena cava.  Vascular congestion and hazy pulmonary edema is similar to the previous day's study. Small pleural effusions, also unchanged.  No pneumothorax.  IMPRESSION: Endotracheal tube is well positioned. Persistent lung opacities most suggestive of pulmonary edema.   Electronically Signed   By: Lajean Manes M.D.   On: 09/19/2013 20:58   Dg Chest Port 1 View  09/18/2013   CLINICAL DATA:  Check endotracheal tube following readjustment  EXAM: PORTABLE CHEST - 1 VIEW  COMPARISON:  09/17/2013  FINDINGS: Cardiac shadow is stable. An endotracheal tube and nasogastric catheter are again seen. The endotracheal tube now lie is 3.5 cm above the carina. Patchy infiltrative changes are again seen predominately within the right lower lobe but stable from the prior exam. The left dialysis  catheter is again identified and stable. A right-sided venous sheath is again seen and stable.  IMPRESSION: Interval enhancement of the endotracheal tube as described above.  The remainder of the exam is stable from the prior study.   Electronically Signed   By: Inez Catalina M.D.   On: 09/18/2013 11:05   Dg Chest Port 1 View  09/17/2013   CLINICAL DATA:  ETT placement  EXAM: PORTABLE CHEST - 1 VIEW  COMPARISON:  Chest radiograph 09/16/2013.  FINDINGS: Low lung volumes. Cardiac silhouette is enlarged. Endotracheal tube tip 7.6 cm above the carina projecting at the level of the clavicles. The patient's Swan-Ganz catheter has been removed. The left-sided central venous catheter stable. NG tube is appreciated tip not viewed on this study. Persistent left lower lobe consolidation. Decreased conspicuity of the right lower lobe density. There is prominence of the interstitial markings. No acute osseous abnormalities.  IMPRESSION: Endotracheal tube  adequately positioned. Interval insertion of an NG tube and removal of the patient's Swan-Ganz catheter. The left-sided dialysis catheter stable.  Improved pulmonary edema.  Persistent left lower lobe density   Electronically Signed   By: Margaree Mackintosh M.D.   On: 09/17/2013 20:40   Dg Chest Port 1 View  09/16/2013   CLINICAL DATA:  Swan-Ganz position  EXAM: PORTABLE CHEST - 1 VIEW  COMPARISON:  Multiple prior studies  FINDINGS: There is a Swan-Ganz central venous line with tip in the distal right main pulmonary artery in the region of the right hilum. There is extensive right middle lobe consolidation. There is left lower lobe consolidation.  IMPRESSION: Swan-Ganz central line projects over the right hilum.   Electronically Signed   By: Skipper Cliche M.D.   On: 09/16/2013 15:31   Dg Chest Port 1 View  09/12/2013   CLINICAL DATA:  Dialysis catheter placement.  EXAM: PORTABLE CHEST - 1 VIEW  COMPARISON:  09/10/2013  FINDINGS: There has been interval placement of a left  jugular dual-lumen central venous catheter with tips overlying the lower SVC. Right jugular central venous catheter remains in place with tip overlying the mid to lower SVC. The cardiac silhouette remains enlarged, unchanged. Pulmonary edema does not appear significantly changed. Veiling opacities in both lung bases are again seen, compatible with small bilateral pleural effusions. The patient has taken a greater inspiration, and there is mildly improved aeration of the lung bases. No pneumothorax is identified.  IMPRESSION: 1. Interval dialysis catheter placement as above. 2. Unchanged pulmonary edema and small bilateral pleural effusions. 3. Bibasilar atelectasis, improved from prior.   Electronically Signed   By: Logan Bores   On: 09/12/2013 13:11   Dg Chest Port 1 View  09/10/2013   CLINICAL DATA:  Congestive heart failure.  EXAM: PORTABLE CHEST - 1 VIEW  COMPARISON:  09/06/2013.  FINDINGS: 0744 hr. Right IJ central venous catheter tip terminates at the lower SVC level. The heart size and mediastinal contours are stable. Pulmonary edema, bibasilar airspace opacities and bilateral pleural effusions are unchanged. There is no evidence of pneumothorax. Telemetry leads overlie the chest.  IMPRESSION: No significant change in edema, bilateral pleural effusions and bibasilar atelectasis.   Electronically Signed   By: Camie Patience M.D.   On: 09/10/2013 09:31   Dg Chest Port 1 View  09/06/2013   CLINICAL DATA:  Right-sided central venous catheter placement.  EXAM: PORTABLE CHEST - 1 VIEW  COMPARISON:  Chest x-ray 09/06/2013.  FINDINGS: Interval removal of previously noted right IJ catheter and replacement with a right IJ Vas-Cath, with tip terminating in the distal superior vena cava. No pneumothorax. Lung volumes are low. Extensive bibasilar opacities (left greater than right) may reflect areas of atelectasis and/or consolidation. There are superimposed small right and small to moderate left pleural effusions.  Cephalization of the pulmonary vasculature, with indistinctness of the interstitial markings, suggesting a background of interstitial pulmonary edema. Mild cardiomegaly. Upper mediastinal contours are within normal limits. Atherosclerosis in the thoracic aorta.  IMPRESSION: 1. Tip of new right IJ Vas-Cath is in the distal superior vena cava. No pneumothorax. 2. The appearance of the chest suggests a background of congestive heart failure. Bibasilar opacities may simply reflect atelectasis, with superimposed pleural effusions, however, underlying airspace consolidation from infection or aspiration is not excluded.   Electronically Signed   By: Vinnie Langton M.D.   On: 09/06/2013 21:40   Dg Chest Port 1 View  09/06/2013   CLINICAL DATA:  Central catheter placement  EXAM: PORTABLE CHEST - 1 VIEW  COMPARISON:  Sep 06, 2013  FINDINGS: Central catheter tip is at the cavoatrial junction. No pneumothorax. There are bilateral effusions with bibasilar edema and cardiomegaly. There is mild pulmonary venous hypertension. There is consolidation in the bases, more on the left than on the right.  IMPRESSION: Central catheter tip at cavoatrial junction. No pneumothorax. Congestive heart failure. Cannot exclude superimposed pneumonia in the bases, particularly on the left.   Electronically Signed   By: Lowella Grip M.D.   On: 09/06/2013 18:34   Dg Fluoro Guide Cv Line-no Report  09/12/2013   CLINICAL DATA: dialysis catheter insertion   FLOURO GUIDE CV LINE  Fluoroscopy was utilized by the requesting physician.  No radiographic  interpretation.     PHYSICAL EXAM   patient is comfortable in bed. She is oriented to person time and place. Affect is normal. Lungs reveal scattered rhonchi. Cardiac exam reveals S1 and S2. She does have mild edema in the left leg. Her surgical areas including the right groin and right lower leg appear stable.   TELEMETRY: I have reviewed telemetry today September 29, 2013. There is normal  sinus rhythm.   ASSESSMENT AND PLAN:    Chronic kidney disease (CKD), stage V   Fortunately ongoing dialysis is successful at this time. This will be continued.      CHF (congestive heart failure)      We are trying to manage her volume with dialysis.    CAD (coronary artery disease)      Patient has severe coronary artery disease. It has been determined that she is not a candidate for bypass surgery to tube multiple comorbidities and poor distal targets.    Cardiogenic shock     There is a continued need for liver failure and dopamine. After dialysis is complete, I am hopeful that the pressures can be weaned somewhat as the day goes on    Acute respiratory failure with hypoxia      Her respiratory status has stabilized.    DVT of axillary vein, acute right      Patient is being anticoagulated with Appropriate medication.    Right groin wound      The patient's post surgical right groin wound appears stable at this time.    Ventricular tachycardia     Patient had cardiac arrest on September 19, 2013. Her rhythm is stable at this time.    Ischemia of foot    This was treated with thrombectomy. Pulses appear to be stable.  Hopefully the patient will remain stable on dialysis. Hopefully later in the day her pressors can be weaned somewhat.    Dola Argyle 09/29/2013 7:44 AM

## 2013-09-29 NOTE — Progress Notes (Signed)
ANTIBIOTIC CONSULT NOTE - Follow Up  Pharmacy Consult for Vancomycin / Zosyn Indication: Wound infection  Allergies  Allergen Reactions  . Crestor [Rosuvastatin] Other (See Comments)    Severe muscle weakness  . Nsaids Other (See Comments)    Not allergic, "bad on my kidneys"  . Ciprofloxacin Rash    Patient Measurements: Height: 5' 7.5" (171.5 cm) Weight: 268 lb 4.8 oz (121.7 kg) IBW/kg (Calculated) : 62.75   Vital Signs: Temp: 97.7 F (36.5 C) (06/13 1211) Temp src: Oral (06/13 1211) BP: 99/68 mmHg (06/13 1330) Pulse Rate: 98 (06/13 1315) Intake/Output from previous day: 06/12 0701 - 06/13 0700 In: 1160.6 [I.V.:1060.6; IV Piggyback:100] Out: 4595 [Drains:50] Intake/Output from this shift: Total I/O In: 255 [I.V.:205; IV Piggyback:50] Out: 2109 [Other:2109]  Labs:  Recent Labs  09/27/13 0408  09/28/13 0340 09/28/13 1600 09/28/13 1624 09/29/13 0330  WBC 9.2  --  8.1  --   --  8.5  HGB 9.9*  --  9.4*  --  9.9* 8.9*  PLT 235  --  240  --   --  260  CREATININE  --   < > 2.14* 2.24* 2.40* 2.09*  < > = values in this interval not displayed. Estimated Creatinine Clearance: 46.4 ml/min (by C-G formula based on Cr of 2.09).  Recent Labs  09/29/13 1135  Garvin 25.7*     Microbiology: Recent Results (from the past 720 hour(s))  SURGICAL PCR SCREEN     Status: None   Collection Time    09/05/13  6:21 PM      Result Value Ref Range Status   MRSA, PCR NEGATIVE  NEGATIVE Final   Staphylococcus aureus NEGATIVE  NEGATIVE Final   Comment:            The Xpert SA Assay (FDA     approved for NASAL specimens     in patients over 31 years of age),     is one component of     a comprehensive surveillance     program.  Test performance has     been validated by [employer] for patients greater     than or equal to 67 year old.     It is not intended     to diagnose infection nor to     guide or monitor treatment.  CULTURE, BLOOD (ROUTINE X 2)      Status: None   Collection Time    09/19/13  9:25 PM      Result Value Ref Range Status   Specimen Description BLOOD HEMODIALYSIS CATHETER   Final   Special Requests BOTTLES DRAWN AEROBIC ONLY 10CC   Final   Culture  Setup Time     Final   Value: 09/20/2013 00:44     Performed at [facility]   Culture     Final   Value: NO GROWTH 5 DAYS     Performed at [facility]   Report Status 09/26/2013 FINAL   Final  CULTURE, BLOOD (ROUTINE X 2)     Status: None   Collection Time    09/19/13  9:35 PM      Result Value Ref Range Status   Specimen Description BLOOD LEFT HAND   Final   Special Requests BOTTLES DRAWN AEROBIC ONLY 5CC   Final   Culture  Setup Time     Final   Value: 09/20/2013 00:43     Performed at [facility]  Culture     Final   Value: NO GROWTH 5 DAYS     Performed at [facility]   Report Status 09/26/2013 FINAL   Final  WOUND CULTURE     Status: None   Collection Time    09/26/13 11:17 AM      Result Value Ref Range Status   Specimen Description WOUND RIGHT LEG   Final   Special Requests NONE   Final   Gram Stain     Final   Value: MODERATE WBC PRESENT,BOTH PMN AND MONONUCLEAR     NO SQUAMOUS EPITHELIAL CELLS SEEN     MODERATE GRAM NEGATIVE RODS     FEW GRAM POSITIVE COCCI     IN PAIRS FEW GRAM POSITIVE RODS   Culture     Final   Value: MULTIPLE ORGANISMS PRESENT, NONE PREDOMINANT     Note: NO STAPHYLOCOCCUS AUREUS ISOLATED NO GROUP A STREP (S.PYOGENES) ISOLATED     Performed at [facility]   Report Status 09/28/2013 FINAL   Final    Medical History: Past Medical History  Diagnosis Date  . High cholesterol   . Diabetic retinopathy   . Peripheral neuropathy     "tips of toes"  . Blind right eye   . CHF (congestive heart failure)   . CAD (coronary artery disease)   . GERD (gastroesophageal reflux disease)   . Hypertension   . History of lung cancer 07/2011    s/p left lower lobectomy  . Diabetes mellitus      IDDM  . Cataract of right eye   . Ulcer of toe of right foot 07/10/2012    great toe  . Breast calcification, left 06/2012  . Acute biphenotypic leukemia   . CKD (chronic kidney disease) stage 5, GFR less than 15 ml/min   . Nephrotic syndrome   . Gastritis     H/o gastritis on prior endoscopy  . Anemia   . Carotid artery disease      Assessment: 43yof with multiple medical problems on CVVHD and  s/p femoral embolectomy.  She now has wound infection at groin site. Started broad spectrum ABX.  Wound Cx in process.  Vancomycin trough drawn today 25.1 slightly > goal will decrease dose.  Zosyn dose decrease today for possiblilty of causing tremor.   Goal of Therapy:  Vancomycin trough 15-20  Plan:  Zosyn 3.375 Gm q8 Vancomycin 1gm q24  Bonnita Nasuti Pharm.D. CPP, BCPS Clinical Pharmacist [phone] 09/29/2013 1:50 PM

## 2013-09-29 NOTE — Progress Notes (Signed)
Subjective  - POD #1, status post debridement of right groin and application of wound VAC  Patient complaining of pain around the operation site in the right groin   Physical Exam:  Right foot is warm and well perfused Wound VAC in place without complication        Assessment/Plan:  POD #1  The patient tolerated her operation well, yesterday. Will plan for a wound VAC changed on Monday  BRABHAM IV, V. WELLS 09/29/2013 10:59 AM --  Filed Vitals:   09/29/13 1000  BP: 85/55  Pulse: 102  Temp:   Resp: 10    Intake/Output Summary (Last 24 hours) at 09/29/13 1059 Last data filed at 09/29/13 1000  Gross per 24 hour  Intake 1206.9 ml  Output   5236 ml  Net -4029.1 ml     Laboratory CBC    Component Value Date/Time   WBC 8.5 09/29/2013 0330   HGB 8.9* 09/29/2013 0330   HCT 30.5* 09/29/2013 0330   PLT 260 09/29/2013 0330    BMET    Component Value Date/Time   NA 134* 09/29/2013 0330   K 4.7 09/29/2013 0330   CL 97 09/29/2013 0330   CO2 24 09/29/2013 0330   GLUCOSE 275* 09/29/2013 0330   BUN 16 09/29/2013 0330   CREATININE 2.09* 09/29/2013 0330   CREATININE 4.17* 07/06/2013 1618   CALCIUM 8.0* 09/29/2013 0330   GFRNONAA 27* 09/29/2013 0330   GFRNONAA 71 07/13/2011 0835   GFRAA 32* 09/29/2013 0330   GFRAA 82 07/13/2011 0835    COAG Lab Results  Component Value Date   INR 2.17* 09/29/2013   INR 2.59* 09/28/2013   INR 2.58* 09/27/2013   No results found for this basename: PTT    Antibiotics Anti-infectives   Start     Dose/Rate Route Frequency Ordered Stop   09/29/13 0600  piperacillin-tazobactam (ZOSYN) IVPB 3.375 g     3.375 g 100 mL/hr over 30 Minutes Intravenous 3 times per day 09/28/13 1940     09/27/13 1200  vancomycin (VANCOCIN) 1,250 mg in sodium chloride 0.9 % 250 mL IVPB     1,250 mg 166.7 mL/hr over 90 Minutes Intravenous Every 24 hours 09/26/13 1033     09/26/13 1200  piperacillin-tazobactam (ZOSYN) IVPB 3.375 g  Status:  Discontinued     3.375 g 100 mL/hr over 30 Minutes Intravenous 4 times per day 09/26/13 1033 09/28/13 1940   09/26/13 1200  vancomycin (VANCOCIN) 2,000 mg in sodium chloride 0.9 % 500 mL IVPB     2,000 mg 250 mL/hr over 120 Minutes Intravenous  Once 09/26/13 1033 09/26/13 1319   09/18/13 1200  cefUROXime (ZINACEF) 1.5 g in dextrose 5 % 50 mL IVPB     1.5 g 100 mL/hr over 30 Minutes Intravenous Every 24 hours 09/17/13 1840 09/18/13 1200   09/18/13 0100  cefUROXime (ZINACEF) 1.5 g in dextrose 5 % 50 mL IVPB  Status:  Discontinued     1.5 g 100 mL/hr over 30 Minutes Intravenous Every 12 hours 09/17/13 1749 09/17/13 1840   09/17/13 1330  cefUROXime (ZINACEF) 1.5 g in dextrose 5 % 50 mL IVPB     1.5 g 100 mL/hr over 30 Minutes Intravenous To Surgery 09/17/13 1317 09/17/13 1348   09/12/13 0000  ceFAZolin (ANCEF) IVPB 1 g/50 mL premix    Comments:  Send with pt to OR   1 g 100 mL/hr over 30 Minutes Intravenous On call 09/11/13 1055 09/12/13 0100  Eldridge Abrahams, M.D. [facility] Office: [phone] Pager:  [phone]

## 2013-09-29 NOTE — Progress Notes (Signed)
ANTICOAGULATION CONSULT NOTE - Follow Up Consult  Pharmacy Consult:  Argatroban Indication:  Thromboembolic state  Allergies  Allergen Reactions  . Crestor [Rosuvastatin] Other (See Comments)    Severe muscle weakness  . Nsaids Other (See Comments)    Not allergic, "bad on my kidneys"  . Ciprofloxacin Rash    Patient Measurements: Height: 5' 7.5" (171.5 cm) Weight: 268 lb 4.8 oz (121.7 kg) IBW/kg (Calculated) : 62.75  Vital Signs: Temp: 97.9 F (36.6 C) (06/13 0400) Temp src: Oral (06/13 0400) BP: 99/51 mmHg (06/13 0700) Pulse Rate: 102 (06/13 0700)  Labs:  Recent Labs  09/27/13 0408  09/28/13 0340 09/28/13 1600 09/28/13 1624 09/28/13 2000 09/29/13 0330  HGB 9.9*  --  9.4*  --  9.9*  --  8.9*  HCT 32.7*  --  31.6*  --  29.0*  --  30.5*  PLT 235  --  240  --   --   --  260  APTT 81*  --  77*  --   --  61* 61*  LABPROT 26.8*  --  26.9*  --   --   --  23.5*  INR 2.58*  --  2.59*  --   --   --  2.17*  CREATININE  --   < > 2.14* 2.24* 2.40*  --  2.09*  < > = values in this interval not displayed.  Estimated Creatinine Clearance: 46.4 ml/min (by C-G formula based on Cr of 2.09).  Assessment: 40 YOF with right foot ischemia s/p femoral endarterectomy and thrombectomy on 09/17/13.  S/p fasciotomy closure on 09/19/13. HIT panel negative. Now on CVVHD.  Argatroban therapeutic aPTT 61 sec on 0.44mcg/kg/min, Hgb 8.9, Plts 260, no bleeding noted.  Argatroban was off for OR 6/12 and restarted post R groin wound debridement and wound vac placement. This is therapeutic level x 2    INR 2.17, but falsely elevated due to argatroban.  Currently warfarin on hold but will need 5 day overlap with warfarin and argatroban when restarted per VVS, and argatroban can be held when INR >= 4 and rechecked to determine actual INR.    Goal of Therapy:  aPTT 50 - 90 seconds Monitor platelets by anticoagulation protocol: Yes   Plan 1. Continue Argatroban at 0.4 mcg/kg/min 2. Daily aPTT  Ollen Gross  B. Leitha Schuller, PharmD Clinical Pharmacist - Resident Phone: [phone] Pager: [phone] 09/29/2013 7:21 AM

## 2013-09-30 LAB — RENAL FUNCTION PANEL
ALBUMIN: 1.8 g/dL — AB (ref 3.5–5.2)
Albumin: 1.9 g/dL — ABNORMAL LOW (ref 3.5–5.2)
BUN: 18 mg/dL (ref 6–23)
BUN: 23 mg/dL (ref 6–23)
CALCIUM: 8.2 mg/dL — AB (ref 8.4–10.5)
CO2: 24 meq/L (ref 19–32)
CO2: 25 mEq/L (ref 19–32)
Calcium: 7.8 mg/dL — ABNORMAL LOW (ref 8.4–10.5)
Chloride: 96 mEq/L (ref 96–112)
Chloride: 96 mEq/L (ref 96–112)
Creatinine, Ser: 1.93 mg/dL — ABNORMAL HIGH (ref 0.50–1.10)
Creatinine, Ser: 2.2 mg/dL — ABNORMAL HIGH (ref 0.50–1.10)
GFR calc non Af Amer: 30 mL/min — ABNORMAL LOW (ref >90)
GFR, EST AFRICAN AMERICAN: 35 mL/min — AB (ref >90)
GFR, EST AFRICAN AMERICAN: 30 mL/min — AB (ref >90)
GFR, EST NON AFRICAN AMERICAN: 26 mL/min — AB (ref >90)
GLUCOSE: 244 mg/dL — AB (ref 70–99)
Glucose, Bld: 377 mg/dL — ABNORMAL HIGH (ref 70–99)
PHOSPHORUS: 1.6 mg/dL — AB (ref 2.3–4.6)
POTASSIUM: 4.3 meq/L (ref 3.7–5.3)
POTASSIUM: 4.6 meq/L (ref 3.7–5.3)
Phosphorus: 1.9 mg/dL — ABNORMAL LOW (ref 2.3–4.6)
Sodium: 136 mEq/L — ABNORMAL LOW (ref 137–147)
Sodium: 133 mEq/L — ABNORMAL LOW (ref 137–147)

## 2013-09-30 LAB — GLUCOSE, CAPILLARY
GLUCOSE-CAPILLARY: 241 mg/dL — AB (ref 70–99)
GLUCOSE-CAPILLARY: 278 mg/dL — AB (ref 70–99)
Glucose-Capillary: 266 mg/dL — ABNORMAL HIGH (ref 70–99)
Glucose-Capillary: 367 mg/dL — ABNORMAL HIGH (ref 70–99)

## 2013-09-30 LAB — CBC
HEMATOCRIT: 28.4 % — AB (ref 36.0–46.0)
HEMOGLOBIN: 8.5 g/dL — AB (ref 12.0–15.0)
MCH: 29 pg (ref 26.0–34.0)
MCHC: 29.9 g/dL — ABNORMAL LOW (ref 30.0–36.0)
MCV: 96.9 fL (ref 78.0–100.0)
Platelets: 252 10*3/uL (ref 150–400)
RBC: 2.93 MIL/uL — ABNORMAL LOW (ref 3.87–5.11)
RDW: 17.6 % — ABNORMAL HIGH (ref 11.5–15.5)
WBC: 8.3 10*3/uL (ref 4.0–10.5)

## 2013-09-30 LAB — MAGNESIUM: Magnesium: 2.6 mg/dL — ABNORMAL HIGH (ref 1.5–2.5)

## 2013-09-30 LAB — CARBOXYHEMOGLOBIN
Carboxyhemoglobin: 2.3 % — ABNORMAL HIGH (ref 0.5–1.5)
Methemoglobin: 0.8 % (ref 0.0–1.5)
O2 SAT: 63 %
TOTAL HEMOGLOBIN: 9.1 g/dL — AB (ref 12.0–16.0)

## 2013-09-30 LAB — APTT: APTT: 68 s — AB (ref 24–37)

## 2013-09-30 MED ORDER — INSULIN GLARGINE 100 UNIT/ML ~~LOC~~ SOLN
35.0000 [IU] | SUBCUTANEOUS | Status: DC
  Administered 2013-10-01 – 2013-10-02 (×2): 35 [IU] via SUBCUTANEOUS
  Filled 2013-09-30 (×2): qty 0.35

## 2013-09-30 NOTE — Progress Notes (Signed)
Assessment:  1. Dialysis dependent anuric AoCKD5, now ESRD, cardiogenic shock, cont CRRT--improved UF with current maneuvers 2. 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 S/p R femoral endarterectomy wound complications, thrombectomy , and fasciotomy 5. CAD, ischemic CM 6. Anemia Plan: Cont current UF rate, I have no objection to weaning pressors as tolerated on CRRT.  Once off pressors, hope to transition to IHD and evaluate/prepare for long term dialysis.  Subjective: Interval History: none.  Objective: Vital signs in last 24 hours: Temp:  [97.7 F (36.5 C)-98.3 F (36.8 C)] 98.2 F (36.8 C) (06/14 0730) Pulse Rate:  [81-131] 102 (06/14 1000) Resp:  [11-37] 19 (06/14 1000) BP: (72-130)/(26-80) 116/63 mmHg (06/14 1000) SpO2:  [94 %-100 %] 100 % (06/14 1000) Weight:  [117.6 kg (259 lb 4.2 oz)] 117.6 kg (259 lb 4.2 oz) (06/14 0500) Weight change: -4.1 kg (-9 lb 0.6 oz)  Intake/Output from previous day: 06/13 0701 - 06/14 0700 In: 1282.9 [P.O.:100; I.V.:857.9; IV Piggyback:325] Out: 7211 [Drains:25] Intake/Output this shift: Total I/O In: 122.8 [I.V.:97.8; IV Piggyback:25] Out: 462 [Other:951]  General appearance: alert and cooperative Resp: clear to auscultation bilaterally Cardio: regular rate and rhythm, S1, S2 normal, no murmur, click, rub or gallop GI: soft, non-tender; bowel sounds normal; no masses,  no organomegaly Extremities: edema less edematous, RLE post op  Lab Results:  Recent Labs  09/29/13 0330 09/30/13 0410  WBC 8.5 8.3  HGB 8.9* 8.5*  HCT 30.5* 28.4*  PLT 260 252   BMET:  Recent Labs  09/29/13 1530 09/30/13 0410  NA 132* 136*  K 4.6 4.3  CL 94* 96  CO2 24 24  GLUCOSE 388* 244*  BUN 19 18  CREATININE 2.18* 1.93*  CALCIUM 8.1* 8.2*   No results found for this basename: PTH,  in the last 72 hours Iron Studies: No results found for this basename: IRON, TIBC, TRANSFERRIN,  FERRITIN,  in the last 72 hours Studies/Results: No results found.  Scheduled: . aspirin  81 mg Oral Daily  . atorvastatin  80 mg Oral q1800  . calcitRIOL  0.25 mcg Oral Daily  . darbepoetin (ARANESP) injection - NON-DIALYSIS  200 mcg Subcutaneous Q Wed-1800  . feeding supplement (NEPRO CARB STEADY)  237 mL Oral TID WC  . insulin aspart  0-15 Units Subcutaneous TID WC  . insulin aspart  0-5 Units Subcutaneous QHS  . insulin glargine  30 Units Subcutaneous Q24H  . lidocaine (PF)  5 mL Other Once  . midodrine  10 mg Oral TID WC  . pantoprazole  40 mg Oral Daily  . piperacillin-tazobactam (ZOSYN)  IV  3.375 g Intravenous 3 times per day  . polyethylene glycol  17 g Oral Daily  . sodium chloride  10-40 mL Intracatheter Q12H  . vancomycin  1,000 mg Intravenous Q24H     LOS: 27 days   Cayne Yom C 09/30/2013,10:21 AM

## 2013-09-30 NOTE — Progress Notes (Signed)
Patient ID: Hannah [name], female   DOB: 05/30/1970, 43 y.o.   MRN: [id number]    SUBJECTIVE:  Patient looks good today. Ultrafiltration is continuing. Her blood pressure is stable on the pressors. We are beginning a very slow wean of the pressors.   Filed Vitals:   09/30/13 0915 09/30/13 0930 09/30/13 0945 09/30/13 1000  BP: 102/69 99/58 111/51 116/63  Pulse: 88 106 103 102  Temp:      TempSrc:      Resp: 15 14 13 19   Height:      Weight:      SpO2: 95% 99% 100% 100%     Intake/Output Summary (Last 24 hours) at 09/30/13 1031 Last data filed at 09/30/13 1000  Gross per 24 hour  Intake 1299.45 ml  Output   7071 ml  Net -5771.55 ml    LABS: Basic Metabolic Panel:  Recent Labs  09/29/13 0330 09/29/13 1530 09/30/13 0410  NA 134* 132* 136*  K 4.7 4.6 4.3  CL 97 94* 96  CO2 24 24 24   GLUCOSE 275* 388* 244*  BUN 16 19 18   CREATININE 2.09* 2.18* 1.93*  CALCIUM 8.0* 8.1* 8.2*  MG 2.4  --  2.6*  PHOS 2.3 1.5* 1.6*   Liver Function Tests:  Recent Labs  09/29/13 1530 09/30/13 0410  ALBUMIN 1.9* 1.9*   No results found for this basename: LIPASE, AMYLASE,  in the last 72 hours CBC:  Recent Labs  09/29/13 0330 09/30/13 0410  WBC 8.5 8.3  HGB 8.9* 8.5*  HCT 30.5* 28.4*  MCV 97.8 96.9  PLT 260 252   Cardiac Enzymes: No results found for this basename: CKTOTAL, CKMB, CKMBINDEX, TROPONINI,  in the last 72 hours BNP: No components found with this basename: POCBNP,  D-Dimer: No results found for this basename: DDIMER,  in the last 72 hours Hemoglobin A1C: No results found for this basename: HGBA1C,  in the last 72 hours Fasting Lipid Panel: No results found for this basename: CHOL, HDL, LDLCALC, TRIG, CHOLHDL, LDLDIRECT,  in the last 72 hours Thyroid Function Tests: No results found for this basename: TSH, T4TOTAL, FREET3, T3FREE, THYROIDAB,  in the last 72 hours  RADIOLOGY: Dg Chest 1 View  09/14/2013   CLINICAL DATA:  Check aortic balloon pump  EXAM:  CHEST - 1 VIEW  COMPARISON:  09/12/2013  FINDINGS: Cardiac shadow is stable. The left dialysis catheter is stable in the proximal superior vena cava. The right jugular temporary dialysis catheter has been removed. A new right subclavian sheath with Swan-Ganz catheter deep in the right pulmonary artery is noted. And intra-aortic balloon pump is noted superimposed over the aortic knob. The tip of the balloon pump extends beyond the room calcification of the thoracic aortic arch. Clinical correlation is recommended. Mild increased density is noted in the bases bilaterally but related to posteriorly of an effusion. Minimal left lower lobe atelectasis is seen.  IMPRESSION: Tubes and lines as described above. The tip of the balloon pump extends beyond in the margin of the room calcification in the thoracic aortic arch. Clinical correlation is recommended.  Small bilateral pleural effusions as well as left retrocardiac atelectasis.   Electronically Signed   By: Inez Catalina M.D.   On: 09/14/2013 16:13   Dg Chest 2 View  09/06/2013   CLINICAL DATA:  CHF, intermittent dyspnea  EXAM: CHEST  2 VIEW  COMPARISON:  Prior chest x-ray 08/13/2013; prior chest CT 08/22/2013  FINDINGS: Stable cardiac and mediastinal contours. Atherosclerotic  calcifications are present within the transverse aorta. Moderate left and small right pleural effusions are similar compared to prior. There is persistent associated bibasilar atelectasis. Mild pulmonary vascular congestion without overt edema. No pneumothorax. No new focal airspace consolidation. No acute osseous abnormality.  IMPRESSION: 1. Stable moderate left and small right pleural effusions and associated bibasilar atelectasis. 2. Pulmonary vascular congestion without overt edema.   Electronically Signed   By: Jacqulynn Cadet M.D.   On: 09/06/2013 07:55   Dg Chest Port 1 View  09/20/2013   CLINICAL DATA:  Central line insertion.  EXAM: PORTABLE CHEST - 1 VIEW  COMPARISON:  09/19/2013.   FINDINGS: Right subclavian central line tip projects over the SVC. Left IJ dialysis catheter tip projects over the lower SVC. Heart size stable. Mild diffuse interstitial prominence and indistinctness with overall improvement in aeration from 09/19/2013. No pneumothorax. Small bilateral effusions.  IMPRESSION: 1. Right subclavian central line placement without complicating feature. 2. Improving congestive heart failure and left lower lobe collapse/consolidation.   Electronically Signed   By: Lorin Picket M.D.   On: 09/20/2013 16:57   Dg Chest Port 1 View  09/19/2013   CLINICAL DATA:  Evaluate endotracheal tube placement.  EXAM: PORTABLE CHEST - 1 VIEW  COMPARISON:  09/18/2013  FINDINGS: Endotracheal tube tip lies 3.7 cm above the chronic, well positioned. Nasogastric tube has been removed.  Left internal jugular dual-lumen tunneled central venous catheter is stable with its tip in the lower superior vena cava.  Vascular congestion and hazy pulmonary edema is similar to the previous day's study. Small pleural effusions, also unchanged.  No pneumothorax.  IMPRESSION: Endotracheal tube is well positioned. Persistent lung opacities most suggestive of pulmonary edema.   Electronically Signed   By: Lajean Manes M.D.   On: 09/19/2013 20:58   Dg Chest Port 1 View  09/18/2013   CLINICAL DATA:  Check endotracheal tube following readjustment  EXAM: PORTABLE CHEST - 1 VIEW  COMPARISON:  09/17/2013  FINDINGS: Cardiac shadow is stable. An endotracheal tube and nasogastric catheter are again seen. The endotracheal tube now lie is 3.5 cm above the carina. Patchy infiltrative changes are again seen predominately within the right lower lobe but stable from the prior exam. The left dialysis catheter is again identified and stable. A right-sided venous sheath is again seen and stable.  IMPRESSION: Interval enhancement of the endotracheal tube as described above.  The remainder of the exam is stable from the prior study.    Electronically Signed   By: Inez Catalina M.D.   On: 09/18/2013 11:05   Dg Chest Port 1 View  09/17/2013   CLINICAL DATA:  ETT placement  EXAM: PORTABLE CHEST - 1 VIEW  COMPARISON:  Chest radiograph 09/16/2013.  FINDINGS: Low lung volumes. Cardiac silhouette is enlarged. Endotracheal tube tip 7.6 cm above the carina projecting at the level of the clavicles. The patient's Swan-Ganz catheter has been removed. The left-sided central venous catheter stable. NG tube is appreciated tip not viewed on this study. Persistent left lower lobe consolidation. Decreased conspicuity of the right lower lobe density. There is prominence of the interstitial markings. No acute osseous abnormalities.  IMPRESSION: Endotracheal tube adequately positioned. Interval insertion of an NG tube and removal of the patient's Swan-Ganz catheter. The left-sided dialysis catheter stable.  Improved pulmonary edema.  Persistent left lower lobe density   Electronically Signed   By: Margaree Mackintosh M.D.   On: 09/17/2013 20:40   Dg Chest Port 1 View  09/16/2013  CLINICAL DATA:  Swan-Ganz position  EXAM: PORTABLE CHEST - 1 VIEW  COMPARISON:  Multiple prior studies  FINDINGS: There is a Swan-Ganz central venous line with tip in the distal right main pulmonary artery in the region of the right hilum. There is extensive right middle lobe consolidation. There is left lower lobe consolidation.  IMPRESSION: Swan-Ganz central line projects over the right hilum.   Electronically Signed   By: Skipper Cliche M.D.   On: 09/16/2013 15:31   Dg Chest Port 1 View  09/12/2013   CLINICAL DATA:  Dialysis catheter placement.  EXAM: PORTABLE CHEST - 1 VIEW  COMPARISON:  09/10/2013  FINDINGS: There has been interval placement of a left jugular dual-lumen central venous catheter with tips overlying the lower SVC. Right jugular central venous catheter remains in place with tip overlying the mid to lower SVC. The cardiac silhouette remains enlarged, unchanged. Pulmonary  edema does not appear significantly changed. Veiling opacities in both lung bases are again seen, compatible with small bilateral pleural effusions. The patient has taken a greater inspiration, and there is mildly improved aeration of the lung bases. No pneumothorax is identified.  IMPRESSION: 1. Interval dialysis catheter placement as above. 2. Unchanged pulmonary edema and small bilateral pleural effusions. 3. Bibasilar atelectasis, improved from prior.   Electronically Signed   By: Logan Bores   On: 09/12/2013 13:11   Dg Chest Port 1 View  09/10/2013   CLINICAL DATA:  Congestive heart failure.  EXAM: PORTABLE CHEST - 1 VIEW  COMPARISON:  09/06/2013.  FINDINGS: 0744 hr. Right IJ central venous catheter tip terminates at the lower SVC level. The heart size and mediastinal contours are stable. Pulmonary edema, bibasilar airspace opacities and bilateral pleural effusions are unchanged. There is no evidence of pneumothorax. Telemetry leads overlie the chest.  IMPRESSION: No significant change in edema, bilateral pleural effusions and bibasilar atelectasis.   Electronically Signed   By: Camie Patience M.D.   On: 09/10/2013 09:31   Dg Chest Port 1 View  09/06/2013   CLINICAL DATA:  Right-sided central venous catheter placement.  EXAM: PORTABLE CHEST - 1 VIEW  COMPARISON:  Chest x-ray 09/06/2013.  FINDINGS: Interval removal of previously noted right IJ catheter and replacement with a right IJ Vas-Cath, with tip terminating in the distal superior vena cava. No pneumothorax. Lung volumes are low. Extensive bibasilar opacities (left greater than right) may reflect areas of atelectasis and/or consolidation. There are superimposed small right and small to moderate left pleural effusions. Cephalization of the pulmonary vasculature, with indistinctness of the interstitial markings, suggesting a background of interstitial pulmonary edema. Mild cardiomegaly. Upper mediastinal contours are within normal limits.  Atherosclerosis in the thoracic aorta.  IMPRESSION: 1. Tip of new right IJ Vas-Cath is in the distal superior vena cava. No pneumothorax. 2. The appearance of the chest suggests a background of congestive heart failure. Bibasilar opacities may simply reflect atelectasis, with superimposed pleural effusions, however, underlying airspace consolidation from infection or aspiration is not excluded.   Electronically Signed   By: Vinnie Langton M.D.   On: 09/06/2013 21:40   Dg Chest Port 1 View  09/06/2013   CLINICAL DATA:  Central catheter placement  EXAM: PORTABLE CHEST - 1 VIEW  COMPARISON:  Sep 06, 2013  FINDINGS: Central catheter tip is at the cavoatrial junction. No pneumothorax. There are bilateral effusions with bibasilar edema and cardiomegaly. There is mild pulmonary venous hypertension. There is consolidation in the bases, more on the left than on the  right.  IMPRESSION: Central catheter tip at cavoatrial junction. No pneumothorax. Congestive heart failure. Cannot exclude superimposed pneumonia in the bases, particularly on the left.   Electronically Signed   By: Lowella Grip M.D.   On: 09/06/2013 18:34   Dg Fluoro Guide Cv Line-no Report  09/12/2013   CLINICAL DATA: dialysis catheter insertion   FLOURO GUIDE CV LINE  Fluoroscopy was utilized by the requesting physician.  No radiographic  interpretation.     PHYSICAL EXAM  she is stable today. Family is in the room.   TELEMETRY:   I have reviewed telemetry today September 30, 2013. She has sinus rhythm. Her rate is 100.   ASSESSMENT AND PLAN:     Acute on chronic systolic heart failure   Cardiogenic shock     She continues to stabilize. Hopefully some of her pressors can be weaned further today.    Ventricular tachycardia      Her rhythm has been stable. No further workup  Dola Argyle 09/30/2013 10:31 AM

## 2013-09-30 NOTE — Progress Notes (Signed)
ANTICOAGULATION CONSULT NOTE - Follow Up Consult  Pharmacy Consult:  Argatroban / Coumadin  Indication:  Thromboembolic state  Allergies  Allergen Reactions  . Crestor [Rosuvastatin] Other (See Comments)    Severe muscle weakness  . Nsaids Other (See Comments)    Not allergic, "bad on my kidneys"  . Ciprofloxacin Rash    Patient Measurements: Height: 5' 7.5" (171.5 cm) Weight: 259 lb 4.2 oz (117.6 kg) IBW/kg (Calculated) : 62.75  Vital Signs: Temp: 98.2 F (36.8 C) (06/14 0730) Temp src: Oral (06/14 0730) BP: 106/67 mmHg (06/14 1200) Pulse Rate: 100 (06/14 1200)  Labs:  Recent Labs  09/28/13 0340  09/28/13 1624 09/28/13 2000 09/29/13 0330 09/29/13 1530 09/30/13 0410  HGB 9.4*  --  9.9*  --  8.9*  --  8.5*  HCT 31.6*  --  29.0*  --  30.5*  --  28.4*  PLT 240  --   --   --  260  --  252  APTT 77*  --   --  61* 61*  --  68*  LABPROT 26.9*  --   --   --  23.5*  --   --   INR 2.59*  --   --   --  2.17*  --   --   CREATININE 2.14*  < > 2.40*  --  2.09* 2.18* 1.93*  < > = values in this interval not displayed.  Estimated Creatinine Clearance: 49.2 ml/min (by C-G formula based on Cr of 1.93).  Assessment: 61 YOF with right foot ischemia s/p femoral endarterectomy and thrombectomy on 09/17/13.  S/p fasciotomy closure on 09/19/13. HIT panel negative. Now on CVVHD.  Argatroban therapeutic aPTT 68sec on 0.52mcg/kg/min, CBC stable, no bleeding noted.    . INR  falsely elevated due to argatroban.  Will need 5 day overlap with warfarin and argatroban, and argatroban can be held when INR >= 4 and rechecked to determine actual INR.     I&D for wound infection 6/12 now with wound vac- hold warfarin until after vac off and I&D complete  Goal of Therapy:  aPTT 50 - 90 seconds Monitor platelets by anticoagulation protocol: Yes   Plan 1. Continue Argatroban at 0.4 mcg/kg/min 2. Daily aPTT   Bonnita Nasuti Pharm.D. CPP, BCPS Clinical Pharmacist [phone] 09/30/2013 12:19  PM

## 2013-09-30 NOTE — Consult Note (Signed)
WOC wound consult note Reason for Consult: Consulted for wound V.A.C. (NPWT) dressing change tomorrow.  Dr. Clayborne Dana note (today) indicates that patient is to return to OR for this procedure.  I have asked bedside RN to obtain a small NPWT black granufoam dressing kit to the bedside today in the event a bedside NPWT dressing change is indicated tomorrow.  As this is a first post-operative change, MD or PA should be present, but [place] nurses are available to assist/replace dressing. Please page to schedule time Monday if you desire. We will follow up with you in am. [place] nursing team will follow, and will remain available to this patient, the nursing, surgical and medical teams.   Thanks, Maudie Flakes, MSN, RN, [place], [place], [place] [phone])

## 2013-09-30 NOTE — Progress Notes (Addendum)
ADVANCED HEART FAILURE ROUNDING NOTE   SUBJECTIVE  43 yo female with PMH of DM1, stage V kidney disease, blindness, h/o lung CA s/p resection who was recently discharged for heart failure symptom had a high risk stress test which showed ischemia in anterior and apical region. She underwent cath 09/04/2013 which showed chronically occluded RCA and triple vessel dx. CT surgery consulted but not felt to be surgical candidate due to poor PFTs, suboptimal targets, comorbidities and shock. EF 22% by Myoview. 30-35% by echo  Underwent placement of swan and IABP for shock (co-ox 38%). Unfortunately developed cold foot and IABP had to be removed.Underwent R femoral thrombectomy and 4 compartment fasciotomy of RLE on 6/1.On 6/3 had VT/VF->PEA arrest 10 mins CPR. U/s shows R axillary DVT  Continues on CVVHD. Down another 9 pounds. Now back to nadir weight.   Weaning pressors. Levophed at 5. Also on dobutamine 5. On midodrine 10 tid. Underwent R groin wound debridement with wound vac placement last week. Feels great. C/o R groin pain. CVP 5. Still with twitching. Amio stopped.   Co-ox 63% this am.     CURRENT MEDS . aspirin  81 mg Oral Daily  . atorvastatin  80 mg Oral q1800  . calcitRIOL  0.25 mcg Oral Daily  . darbepoetin (ARANESP) injection - NON-DIALYSIS  200 mcg Subcutaneous Q Wed-1800  . feeding supplement (NEPRO CARB STEADY)  237 mL Oral TID WC  . insulin aspart  0-15 Units Subcutaneous TID WC  . insulin aspart  0-5 Units Subcutaneous QHS  . insulin glargine  30 Units Subcutaneous Q24H  . lidocaine (PF)  5 mL Other Once  . midodrine  10 mg Oral TID WC  . pantoprazole  40 mg Oral Daily  . piperacillin-tazobactam (ZOSYN)  IV  3.375 g Intravenous 3 times per day  . polyethylene glycol  17 g Oral Daily  . sodium chloride  10-40 mL Intracatheter Q12H  . vancomycin  1,000 mg Intravenous Q24H    OBJECTIVE  Filed Vitals:   09/30/13 1115 09/30/13 1130 09/30/13 1145 09/30/13 1200  BP:  110/61  110/70 106/67  Pulse:  104  100  Temp:      TempSrc:      Resp: 15 22 18 13   Height:      Weight:      SpO2:  100%  100%    Intake/Output Summary (Last 24 hours) at 09/30/13 1214 Last data filed at 09/30/13 1200  Gross per 24 hour  Intake 1289.95 ml  Output   7290 ml  Net -6000.05 ml   Filed Weights   09/28/13 0600 09/29/13 0600 09/30/13 0500  Weight: 123.8 kg (272 lb 14.9 oz) 121.7 kg (268 lb 4.8 oz) 117.6 kg (259 lb 4.2 oz)    PHYSICAL EXAM General:Lying in bed.  NAD Neuro: Alert and oriented X 3. Moves all extremities spontaneous HEENT:  Normal  Neck: Supple without bruits. Chest L subclav PC. R subclav TLC. Unable to see JVD Lungs:  clear Heart: tachy regular. +s3 Abdomen: Soft, non-tender, non-distended, BS + x 4.  Extremities: tr-1+ edema. R foot. Warm viable. Weak pulses Wound vac in R groin  CBC  Recent Labs  09/29/13 0330 09/30/13 0410  WBC 8.5 8.3  HGB 8.9* 8.5*  HCT 30.5* 28.4*  MCV 97.8 96.9  PLT 260 578   Basic Metabolic Panel  Recent Labs  09/29/13 0330 09/29/13 1530 09/30/13 0410  NA 134* 132* 136*  K 4.7 4.6 4.3  CL 97 94*  96  CO2 24 24 24   GLUCOSE 275* 388* 244*  BUN 16 19 18   CREATININE 2.09* 2.18* 1.93*  CALCIUM 8.0* 8.1* 8.2*  MG 2.4  --  2.6*  PHOS 2.3 1.5* 1.6*   Liver Function Tests  Recent Labs  09/29/13 1530 09/30/13 0410  ALBUMIN 1.9* 1.9*   Cardiac Enzymes No results found for this basename: CKTOTAL, CKMB, CKMBINDEX, TROPONINI,  in the last 72 hours Thyroid Function Tests No results found for this basename: TSH, T4TOTAL, FREET3, T3FREE, THYROIDAB,  in the last 72 hours  TELE  Sinus tachycardia 100-110   Radiology/Studies  Dg Chest 2 View  09/06/2013   CLINICAL DATA:  CHF, intermittent dyspnea  EXAM: CHEST  2 VIEW  COMPARISON:  Prior chest x-ray 08/13/2013; prior chest CT 08/22/2013  FINDINGS: Stable cardiac and mediastinal contours. Atherosclerotic calcifications are present within the transverse aorta.  Moderate left and small right pleural effusions are similar compared to prior. There is persistent associated bibasilar atelectasis. Mild pulmonary vascular congestion without overt edema. No pneumothorax. No new focal airspace consolidation. No acute osseous abnormality.  IMPRESSION: 1. Stable moderate left and small right pleural effusions and associated bibasilar atelectasis. 2. Pulmonary vascular congestion without overt edema.   Electronically Signed   By: Jacqulynn Cadet M.D.   On: 09/06/2013 07:55   Ct Chest Wo Contrast  08/22/2013   CLINICAL DATA:  Left lung cancer status post lobectomy. Weight gain. Renal insufficiency.  EXAM: CT CHEST WITHOUT CONTRAST  TECHNIQUE: Multidetector CT imaging of the chest was performed following the standard protocol without IV contrast.  COMPARISON:  Radiographs 08/13/2013 and 08/04/2013.  CT 08/02/2012.  FINDINGS: There are stable postsurgical changes related to prior left lower lobe resection. There has been interval enlargement of several mediastinal lymph nodes. These include 12 mm right paratracheal (image 19), 13 mm precarinal (image 23), and 14 mm AP window (image 25) lymph nodes. Some of these nodes have retained fatty hila. Allowing for the limitations of noncontrast technique, the hila appear stable.  There is stable low-density within the right thyroid lobe. Atherosclerosis of the aorta, great vessels and coronary arteries is noted. The heart size is normal. There is no pericardial effusion.  Moderate size dependent pleural effusions are present bilaterally. There is associated dependent airspace disease in both lower lobes. In addition, there are more focal airspace opacities within the right lower lobe and inferior aspect of the left upper lobe. The latter is somewhat nodular, measuring up to 1.8 cm on image 43. No other focal nodularity or endobronchial lesions are demonstrated.  The visualized upper abdomen has a stable appearance. There is no adrenal mass.  There is increased subcutaneous edema throughout the subcutaneous fat, especially within the anterior aspect of the upper abdomen.  No worrisome osseous findings are demonstrated.  IMPRESSION: 1. As demonstrated radiographically, there are bilateral pleural effusions and bilateral airspace opacities which are new compared with the prior CT. Although there are focal somewhat nodular components in the right lower and left upper lobes, these findings are most likely infectious/inflammatory. 2. Progressive mediastinal lymphadenopathy. Some of the nodes have retained fatty hila and may be reactive. Metastatic disease cannot be completely excluded. 3. Stable atherosclerosis and low-density within the right thyroid lobe. 4. If the airspace opacities and pleural effusions fail to respond to appropriate clinical therapy, follow-up CT or PET-CT may be warranted to exclude metastatic disease.   Electronically Signed   By: Camie Patience M.D.   On: 08/22/2013 10:26  Dg Chest Portable 1 View  08/13/2013   CLINICAL DATA:  Shortness of breath increasing for 2 weeks. History of CHF.  EXAM: PORTABLE CHEST - 1 VIEW  COMPARISON:  08/04/2013  FINDINGS: Again noted are bibasilar lung densities that are suggestive for pleural effusions and atelectasis. Heart size appears to be enlarged. There is some peribronchial thickening and cannot exclude pulmonary edema.  IMPRESSION: Bilateral pleural effusions with basilar atelectasis. There is mild pulmonary edema. Minimal change from the previous examination.   Electronically Signed   By: Markus Daft M.D.   On: 08/13/2013 23:10    ASSESSMENT AND PLAN 1. Cardiogenic shock 2. Acute on chronic systolic HF     EF ~77-37% 3. 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 Acute respiratory failure 5. 3-V CAD 6. Carotid stenosis     - doppler 08/31/2013 shows R 60-79% ICA stenosis, L 80-99% ICA stenosis     - vascular surgery aware 7. Lung CA  h/o LLL lobectomy (2 yr ago due to non-small cell carcinoma)      - PFT  09/05/2013 pre-FEV1 28%, pre-FVC 34%, pre-FEV1/FVC ratio 68 8. DM1 9. Ischemic R foot s/p R femoral thrombectomy and 4-compartment fasciotomy.  10. VT/VF arrest 6/3 11. R axillary DVT 12. Severe protein calorie malnutrition  13. Large right groin wound s/p debridement 6/12  Continues to do well with volume removal after addition of midodrine. CVP 5. Will slow CVVHD to 150 to reduce risk of hypotension later today.. May be able to switch over to intermittent HD tomorrow. Will d/w Renal.   Continue to wean levophed as tolerated. She is aware that in order to be eligible for outpatient HD we will have to be able to wean off levophed and dobutamine completely. I am not sure this is possible.   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 Will continue. Will not start coumadin until ok with VVS.   CBGs very high. I increased Lantus to 30 yesterday. Will go to 35 today.   Wound cx. GNR on GS but no isolated organisms. On vanc/zosyn. Back to OR for wound vac change tomorrow.  Wound care consult ordered.   Continue medical therapy of CAD. No targets for CABG or PCI. No b-blocker or ACE due to shock.  Eudelia Hiltunen,MD 12:14 PM

## 2013-10-01 ENCOUNTER — Encounter (HOSPITAL_COMMUNITY): Payer: Self-pay | Admitting: Surgery

## 2013-10-01 LAB — RENAL FUNCTION PANEL
ALBUMIN: 1.8 g/dL — AB (ref 3.5–5.2)
Albumin: 1.8 g/dL — ABNORMAL LOW (ref 3.5–5.2)
BUN: 18 mg/dL (ref 6–23)
BUN: 21 mg/dL (ref 6–23)
CALCIUM: 7.9 mg/dL — AB (ref 8.4–10.5)
CHLORIDE: 96 meq/L (ref 96–112)
CO2: 25 meq/L (ref 19–32)
CO2: 26 mEq/L (ref 19–32)
CREATININE: 2.32 mg/dL — AB (ref 0.50–1.10)
Calcium: 8.2 mg/dL — ABNORMAL LOW (ref 8.4–10.5)
Chloride: 96 mEq/L (ref 96–112)
Creatinine, Ser: 1.84 mg/dL — ABNORMAL HIGH (ref 0.50–1.10)
GFR calc non Af Amer: 32 mL/min — ABNORMAL LOW (ref >90)
GFR, EST AFRICAN AMERICAN: 37 mL/min — AB (ref >90)
GFR, EST AFRICAN AMERICAN: 28 mL/min — AB (ref >90)
GFR, EST NON AFRICAN AMERICAN: 24 mL/min — AB (ref >90)
GLUCOSE: 219 mg/dL — AB (ref 70–99)
Glucose, Bld: 415 mg/dL — ABNORMAL HIGH (ref 70–99)
PHOSPHORUS: 2.4 mg/dL (ref 2.3–4.6)
POTASSIUM: 4.4 meq/L (ref 3.7–5.3)
Phosphorus: 1.8 mg/dL — ABNORMAL LOW (ref 2.3–4.6)
Potassium: 4.9 mEq/L (ref 3.7–5.3)
SODIUM: 135 meq/L — AB (ref 137–147)
SODIUM: 132 meq/L — AB (ref 137–147)

## 2013-10-01 LAB — CBC
HCT: 27.5 % — ABNORMAL LOW (ref 36.0–46.0)
Hemoglobin: 8.3 g/dL — ABNORMAL LOW (ref 12.0–15.0)
MCH: 29.1 pg (ref 26.0–34.0)
MCHC: 30.2 g/dL (ref 30.0–36.0)
MCV: 96.5 fL (ref 78.0–100.0)
PLATELETS: 261 10*3/uL (ref 150–400)
RBC: 2.85 MIL/uL — ABNORMAL LOW (ref 3.87–5.11)
RDW: 17.7 % — AB (ref 11.5–15.5)
WBC: 8.2 10*3/uL (ref 4.0–10.5)

## 2013-10-01 LAB — IRON AND TIBC
Iron: 45 ug/dL (ref 42–135)
SATURATION RATIOS: 22 % (ref 20–55)
TIBC: 209 ug/dL — ABNORMAL LOW (ref 250–470)
UIBC: 164 ug/dL (ref 125–400)

## 2013-10-01 LAB — GLUCOSE, CAPILLARY
GLUCOSE-CAPILLARY: 269 mg/dL — AB (ref 70–99)
GLUCOSE-CAPILLARY: 389 mg/dL — AB (ref 70–99)
Glucose-Capillary: 224 mg/dL — ABNORMAL HIGH (ref 70–99)
Glucose-Capillary: 212 mg/dL — ABNORMAL HIGH (ref 70–99)
Glucose-Capillary: 310 mg/dL — ABNORMAL HIGH (ref 70–99)

## 2013-10-01 LAB — CARBOXYHEMOGLOBIN
CARBOXYHEMOGLOBIN: 2.2 % — AB (ref 0.5–1.5)
Methemoglobin: 0.6 % (ref 0.0–1.5)
O2 SAT: 60.6 %
TOTAL HEMOGLOBIN: 8.6 g/dL — AB (ref 12.0–16.0)

## 2013-10-01 LAB — MAGNESIUM: Magnesium: 2.4 mg/dL (ref 1.5–2.5)

## 2013-10-01 LAB — APTT
APTT: 56 s — AB (ref 24–37)
aPTT: 54 seconds — ABNORMAL HIGH (ref 24–37)

## 2013-10-01 LAB — FERRITIN: FERRITIN: 399 ng/mL — AB (ref 10–291)

## 2013-10-01 MED ORDER — SODIUM PHOSPHATE 3 MMOLE/ML IV SOLN
10.0000 mmol | Freq: Once | INTRAVENOUS | Status: AC
  Administered 2013-10-01: 10 mmol via INTRAVENOUS
  Filled 2013-10-01: qty 3.33

## 2013-10-01 NOTE — Progress Notes (Signed)
Assessment:  1. Dialysis dependent anuric AoCKD5, now ESRD, cardiogenic shock, cont CRRT--improved UF with current maneuvers; weight down 131->144.6 with CRRT; can try to transition to intermittent HD once off dobutamine and norepi; will have to be able to maintain on midodrine alone; replete phos for 1.8 - 10 mmoles IV; reduce UFR to 100/hour 2. 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 S/p R femoral endarterectomy wound complications, thrombectomy , and fasciotomy - wound VAC in place 5. CAD, ischemic CM 6. Anemia - on darbe 200/week; not at goal; check iron studies 7. Twitching - etiology of this not obvious to me... Plan: Cont current UF rate, I have no objection to weaning pressors as tolerated on CRRT.  Once off pressors, hope to transition to IHD and evaluate/prepare for long term dialysis. Other daily plans as above  Subjective: Interval History: Complains of twitching as she has for several days Affecting her speech some  Objective: Vital signs in last 24 hours: Temp:  [97.2 F (36.2 C)-97.7 F (36.5 C)] 97.7 F (36.5 C) (06/15 0400) Pulse Rate:  [50-259] 107 (06/15 0700) Resp:  [10-28] 10 (06/15 0700) BP: (61-116)/(25-82) 101/54 mmHg (06/15 0700) SpO2:  [84 %-100 %] 99 % (06/15 0700) Weight:  [114.6 kg (252 lb 10.4 oz)] 114.6 kg (252 lb 10.4 oz) (06/15 0648) Weight change: -3 kg (-6 lb 9.8 oz)  Current CRRT Prescription:  Start Date: restarted 09/20/13  Catheter: TDC left  BFR: 200  Pre Blood Pump: 300 DFR: 1500  Replacement Rate: 200  Goal UF neg 150   Anticoagulation: argatroban systemic, clotted on heparin, +HIT  Clotting: not excessive  General appearance: alert and cooperative, lying in bed, NAD BP 101/54  Pulse 107  Temp(Src) 97.7 F (36.5 C) (Oral)  Resp 10  Ht 5' 7.5" (1.715 m)  Wt 114.6 kg (252 lb 10.4 oz)  BMI 38.96 kg/m2  SpO2 99%  LMP  08/19/2013 Mouth twitching and tremor hands with intention Alert,oriented Resp: clear to auscultation bilaterally Cardio: regular rate and rhythm, S1, S2 normal, no murmur, click, rub or gallop GI: soft, non-tender; bowel sounds normal; no masses,  no organomegaly Extremities: edema less edematous, RLE post op; wound VAC in place; stapled incisions clean and dry; dusky tips of toes right root #3-5  Intake/Output from previous day: 06/14 0701 - 06/15 0700 In: 998.1 [I.V.:623.1; IV Piggyback:375] Out: 5433 [Drains:100]  Intake/Output this shift: 10/01/13 0648 114.6 kg (252 lb 10.4 oz)   09/30/13 0500 117.6 kg (259 lb 4.2 oz) 09/29/13 0600 121.7 kg (268 lb 4.8 oz) 09/28/13 0600 123.8 kg (272 lb 14.9 oz) 09/27/13 0500 127.2 kg (280 lb 6.8 oz) 09/26/13 0500 130.9 kg (288 lb 9.3 oz) 09/25/13 0500 133.5 kg (294 lb 5 oz)  09/24/13 0600 134.5 kg (296 lb 8.3 oz) 09/23/13 0700 132.8 kg (292 lb 12.3 oz) 09/22/13 0500 131.1 kg (289 lb 0.4 oz) 09/21/13 0500 129.6 kg (285 lb 11.5 oz  09/20/13 0500 131 kg (288 lb 12.8 oz)  CRRT restarted  Recent Labs  09/30/13 0410 09/30/13 1625 10/01/13 0430  NA 136* 133* 135*  K 4.3 4.6 4.4  CL 96 96 96  CO2 24 25 25   GLUCOSE 244* 377* 219*  BUN 18 23 18   CREATININE 1.93* 2.20* 1.84*  CALCIUM 8.2* 7.8* 8.2*  MG 2.6*  --  2.4  PHOS 1.6* 1.9* 1.8*    Recent  Labs  09/30/13 1625 10/01/13 0430  ALBUMIN 1.8* 1.8*    Recent Labs  09/30/13 0410 10/01/13 0430  WBC 8.3 8.2  HGB 8.5* 8.3*  HCT 28.4* 27.5*  MCV 96.9 96.5  PLT 252 261    Results for SESILIA, POUCHER (MRN [id number]) as of 10/01/2013 07:54  Ref. Range 10/01/2013 04:30  Phosphorus Latest Range: 2.3-4.6 mg/dL 1.8 (L)   Scheduled Medications: . aspirin  81 mg Oral Daily  . atorvastatin  80 mg Oral q1800  . calcitRIOL  0.25 mcg Oral Daily  . darbepoetin (ARANESP) injection - NON-DIALYSIS  200 mcg Subcutaneous Q Wed-1800  . feeding supplement (NEPRO CARB STEADY)  237 mL Oral TID WC   . insulin aspart  0-15 Units Subcutaneous TID WC  . insulin aspart  0-5 Units Subcutaneous QHS  . insulin glargine  35 Units Subcutaneous Q24H  . lidocaine (PF)  5 mL Other Once  . midodrine  10 mg Oral TID WC  . pantoprazole  40 mg Oral Daily  . piperacillin-tazobactam (ZOSYN)  IV  3.375 g Intravenous 3 times per day  . polyethylene glycol  17 g Oral Daily  . sodium chloride  10-40 mL Intracatheter Q12H  . vancomycin  1,000 mg Intravenous Q24H  Infusion Medicications . sodium chloride 10 mL/hr at 09/30/13 1200  . argatroban 0.4 mcg/kg/min (10/01/13 0700)  . DOBUTamine 5 mcg/kg/min (10/01/13 0700)  . norepinephrine (LEVOPHED) Adult infusion 4 mcg/min (10/01/13 0700)  . dialysis replacement fluid (prismasate) 300 mL/hr at 09/30/13 1355  . dialysis replacement fluid (prismasate) 200 mL/hr at 09/30/13 0639  . dialysate (PRISMASATE) 1,500 mL/hr at 10/01/13 0151    LOS: 28 days   Hannah [name] 10/01/2013,7:35 AM

## 2013-10-01 NOTE — Progress Notes (Signed)
Inpatient Diabetes Program Recommendations  AACE/ADA: New Consensus Statement on Inpatient Glycemic Control (2013)  Target Ranges:  Prepandial:   less than 140 mg/dL      Peak postprandial:   less than 180 mg/dL (1-2 hours)      Critically ill patients:  140 - 180 mg/dL   Reason for Assessment:  Results for CATALEIA, GADE (MRN [id number]) as of 10/01/2013 10:49  Ref. Range 09/30/2013 07:33 09/30/2013 12:18 09/30/2013 17:04 09/30/2013 22:01 10/01/2013 07:47  Glucose-Capillary Latest Range: 70-99 mg/dL 241 (H) 278 (H) 367 (H) 224 (H) 212 (H)  Note Lantus insulin increased today to 35 units q HS.   Inpatient Diabetes Program Recommendations Insulin - Meal Coverage: May want to consider ordering Novolog 3 units TID meal coverage as long as pt eating at least 50% meal.  Thanks,  Adah Perl, RN, BC-ADM Inpatient Diabetes Coordinator Pager [phone]

## 2013-10-01 NOTE — Progress Notes (Signed)
ADVANCED HEART FAILURE ROUNDING NOTE   SUBJECTIVE  43 yo female with PMH of DM1, stage V kidney disease, blindness, h/o lung CA s/p resection who was recently discharged for heart failure symptom had a high risk stress test which showed ischemia in anterior and apical region. She underwent cath 09/04/2013 which showed chronically occluded RCA and triple vessel dx. CT surgery consulted but not felt to be surgical candidate due to poor PFTs, suboptimal targets, comorbidities and shock. EF 22% by Myoview. 30-35% by echo  Underwent placement of swan and IABP for shock (co-ox 38%). Unfortunately developed cold foot and IABP had to be removed.Underwent R femoral thrombectomy and 4 compartment fasciotomy of RLE on 6/1.On 6/3 had VT/VF->PEA arrest 10 mins CPR. U/s shows R axillary DVT  Continues on CVVHD. Down another 9 pounds. Now back to nadir weight.   Weaning pressors. Levophed at 4. Also on dobutamine 5. On midodrine 10 tid. Underwent R groin wound debridement with wound vac placement last week.   Able to stand and transfer to [facility]. Denies SOB    Co-ox 60% this am.     CURRENT MEDS . aspirin  81 mg Oral Daily  . atorvastatin  80 mg Oral q1800  . calcitRIOL  0.25 mcg Oral Daily  . darbepoetin (ARANESP) injection - NON-DIALYSIS  200 mcg Subcutaneous Q Wed-1800  . feeding supplement (NEPRO CARB STEADY)  237 mL Oral TID WC  . insulin aspart  0-15 Units Subcutaneous TID WC  . insulin aspart  0-5 Units Subcutaneous QHS  . insulin glargine  35 Units Subcutaneous Q24H  . lidocaine (PF)  5 mL Other Once  . midodrine  10 mg Oral TID WC  . pantoprazole  40 mg Oral Daily  . piperacillin-tazobactam (ZOSYN)  IV  3.375 g Intravenous 3 times per day  . polyethylene glycol  17 g Oral Daily  . sodium chloride  10-40 mL Intracatheter Q12H  . sodium phosphate  Dextrose 5% IVPB  10 mmol Intravenous Once  . vancomycin  1,000 mg Intravenous Q24H    OBJECTIVE  Filed Vitals:   10/01/13 0730 10/01/13  0745 10/01/13 0749 10/01/13 0800  BP: 98/55 95/52  97/65  Pulse: 105 106  106  Temp:   97.7 F (36.5 C)   TempSrc:   Oral   Resp: 24 18  16   Height:      Weight:      SpO2: 98% 99%  98%    Intake/Output Summary (Last 24 hours) at 10/01/13 0958 Last data filed at 10/01/13 0800  Gross per 24 hour  Intake  893.3 ml  Output   5181 ml  Net -4287.7 ml   Filed Weights   09/29/13 0600 09/30/13 0500 10/01/13 0648  Weight: 268 lb 4.8 oz (121.7 kg) 259 lb 4.2 oz (117.6 kg) 252 lb 10.4 oz (114.6 kg)    PHYSICAL EXAM General:Lying in bed.  NAD Neuro: Alert and oriented X 3. Moves all extremities spontaneous HEENT:  Normal  Neck: Supple without bruits. Chest L subclav PC. R subclav TLC. Unable to see JVD Lungs:  clear Heart: tachy regular. +s3 Abdomen: Soft, non-tender, non-distended, BS + x 4.  Extremities: tr-1+ edema. R foot. Warm viable. Weak pulses Wound vac in R groin  CBC  Recent Labs  09/30/13 0410 10/01/13 0430  WBC 8.3 8.2  HGB 8.5* 8.3*  HCT 28.4* 27.5*  MCV 96.9 96.5  PLT 252 825   Basic Metabolic Panel  Recent Labs  09/30/13 0410 09/30/13 1625  10/01/13 0430  NA 136* 133* 135*  K 4.3 4.6 4.4  CL 96 96 96  CO2 24 25 25   GLUCOSE 244* 377* 219*  BUN 18 23 18   CREATININE 1.93* 2.20* 1.84*  CALCIUM 8.2* 7.8* 8.2*  MG 2.6*  --  2.4  PHOS 1.6* 1.9* 1.8*   Liver Function Tests  Recent Labs  09/30/13 1625 10/01/13 0430  ALBUMIN 1.8* 1.8*   Cardiac Enzymes No results found for this basename: CKTOTAL, CKMB, CKMBINDEX, TROPONINI,  in the last 72 hours Thyroid Function Tests No results found for this basename: TSH, T4TOTAL, FREET3, T3FREE, THYROIDAB,  in the last 72 hours  TELE  Sinus tachycardia 100-110   Radiology/Studies  Dg Chest 2 View  09/06/2013   CLINICAL DATA:  CHF, intermittent dyspnea  EXAM: CHEST  2 VIEW  COMPARISON:  Prior chest x-ray 08/13/2013; prior chest CT 08/22/2013  FINDINGS: Stable cardiac and mediastinal contours.  Atherosclerotic calcifications are present within the transverse aorta. Moderate left and small right pleural effusions are similar compared to prior. There is persistent associated bibasilar atelectasis. Mild pulmonary vascular congestion without overt edema. No pneumothorax. No new focal airspace consolidation. No acute osseous abnormality.  IMPRESSION: 1. Stable moderate left and small right pleural effusions and associated bibasilar atelectasis. 2. Pulmonary vascular congestion without overt edema.   Electronically Signed   By: Jacqulynn Cadet M.D.   On: 09/06/2013 07:55   Ct Chest Wo Contrast  08/22/2013   CLINICAL DATA:  Left lung cancer status post lobectomy. Weight gain. Renal insufficiency.  EXAM: CT CHEST WITHOUT CONTRAST  TECHNIQUE: Multidetector CT imaging of the chest was performed following the standard protocol without IV contrast.  COMPARISON:  Radiographs 08/13/2013 and 08/04/2013.  CT 08/02/2012.  FINDINGS: There are stable postsurgical changes related to prior left lower lobe resection. There has been interval enlargement of several mediastinal lymph nodes. These include 12 mm right paratracheal (image 19), 13 mm precarinal (image 23), and 14 mm AP window (image 25) lymph nodes. Some of these nodes have retained fatty hila. Allowing for the limitations of noncontrast technique, the hila appear stable.  There is stable low-density within the right thyroid lobe. Atherosclerosis of the aorta, great vessels and coronary arteries is noted. The heart size is normal. There is no pericardial effusion.  Moderate size dependent pleural effusions are present bilaterally. There is associated dependent airspace disease in both lower lobes. In addition, there are more focal airspace opacities within the right lower lobe and inferior aspect of the left upper lobe. The latter is somewhat nodular, measuring up to 1.8 cm on image 43. No other focal nodularity or endobronchial lesions are demonstrated.  The  visualized upper abdomen has a stable appearance. There is no adrenal mass. There is increased subcutaneous edema throughout the subcutaneous fat, especially within the anterior aspect of the upper abdomen.  No worrisome osseous findings are demonstrated.  IMPRESSION: 1. As demonstrated radiographically, there are bilateral pleural effusions and bilateral airspace opacities which are new compared with the prior CT. Although there are focal somewhat nodular components in the right lower and left upper lobes, these findings are most likely infectious/inflammatory. 2. Progressive mediastinal lymphadenopathy. Some of the nodes have retained fatty hila and may be reactive. Metastatic disease cannot be completely excluded. 3. Stable atherosclerosis and low-density within the right thyroid lobe. 4. If the airspace opacities and pleural effusions fail to respond to appropriate clinical therapy, follow-up CT or PET-CT may be warranted to exclude metastatic disease.  Electronically Signed   By: Camie Patience M.D.   On: 08/22/2013 10:26   Dg Chest Portable 1 View  08/13/2013   CLINICAL DATA:  Shortness of breath increasing for 2 weeks. History of CHF.  EXAM: PORTABLE CHEST - 1 VIEW  COMPARISON:  08/04/2013  FINDINGS: Again noted are bibasilar lung densities that are suggestive for pleural effusions and atelectasis. Heart size appears to be enlarged. There is some peribronchial thickening and cannot exclude pulmonary edema.  IMPRESSION: Bilateral pleural effusions with basilar atelectasis. There is mild pulmonary edema. Minimal change from the previous examination.   Electronically Signed   By: Markus Daft M.D.   On: 08/13/2013 23:10    ASSESSMENT AND PLAN 1. Cardiogenic shock 2. Acute on chronic systolic HF     EF ~16-10% 3. 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 Acute respiratory failure 5. 3-V CAD 6. Carotid stenosis     - doppler 08/31/2013 shows R 60-79% ICA stenosis, L 80-99% ICA stenosis     - vascular surgery aware 7. 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 DM1 9. Ischemic R foot s/p R femoral thrombectomy and 4-compartment fasciotomy.  10. VT/VF arrest 6/3 11. R axillary DVT 12. Severe protein calorie malnutrition  13. Large right groin wound s/p debridement 6/12  CVVHD down to 100/hr to  reduce risk of hypotension with wean of levo. Hopefull can switch over to intermittent HD soon. .   Continue to wean levophed as tolerated. She is aware that in order to be eligible for outpatient HD we will have to be able to wean off levophed and dobutamine completely.   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 Will continue. Will not start coumadin until ok with VVS.   CBGs still high. Continue  lantus 35 units for now and adjust accordingly.   Wound cx. GNR on GS but no isolated organisms. On vanc/zosyn. VAC to be changed to day at bedside.     Continue medical therapy of CAD. No targets for CABG or PCI. No b-blocker or ACE due to shock.  CLEGG,AMY, NP-C  9:58 AM  Patient seen and examined with Darrick Grinder, NP. We discussed all aspects of the encounter. I agree with the assessment and plan as stated above.   Continues to improve though unable to wean levophed completely just yet. Now down to 3. Hopefully we can get off over the next 24-48 hours. However in order to permit outpt HD will also need to wean dobutamine which may or may not be possible.  Volume status much improved with CVP now 5. Suspect we can stop CVVHD tomorrow and try intermittent HD.  Groin wound vac changed today. Management per VVS and Wound Care. Continue abx for now. I am hopeful we can start coumadin soon.   CBGs improving.   Continues with severe twitching. No benefit from stopping amio. Zosyn dose changed. Electrolytes ok. Will ask Neuro to see on 6/16.   Benay Spice 7:57 PM

## 2013-10-01 NOTE — Consult Note (Addendum)
WOC wound consult note Reason for Consult: Surgical wound to right groin.  Wound VAC dressing change. PA-C at bedside.  Wound type: Surgical wound Measurement:Not assessed this visit.   Wound bed: 25% yellow tissue, possibly adipose, 75% beefy red, nongranulation tissue.  Drainage (amount, consistency, odor) Moderate, serosanguinous drainage. Periwound:Nonintact lesion noted at 1 o'clock.  Possibly from prior adhesive removal.  Protected with no-sting skin barrier.   Dressing procedure/placement/frequency:  Patient pre-medicated prior to procedure for pain.  Wound cleansed with NS and pat gently dry.  Apply black granufoam (2 pieces) to wound bed.  Top with VAC drape.  Suction maintained at 183mHg. Next dressing change 10/03/13 (Mon-Wed-Fri). Bedside nurses to manage dressing changes going forward.  Will not follow at this time.  Please re-consult if needed.  Domenic Moras RN BSN [place] Pager [phone]

## 2013-10-01 NOTE — Progress Notes (Addendum)
ANTICOAGULATION CONSULT NOTE   Pharmacy Consult:  Argatroban Indication:  Thromboembolic state  Allergies  Allergen Reactions  . Crestor [Rosuvastatin] Other (See Comments)    Severe muscle weakness  . Nsaids Other (See Comments)    Not allergic, "bad on my kidneys"  . Ciprofloxacin Rash    Patient Measurements: Height: 5' 7.5" (171.5 cm) Weight: 252 lb 10.4 oz (114.6 kg) IBW/kg (Calculated) : 62.75  Vital Signs: Temp: 97.5 F (36.4 C) (06/15 1152) Temp src: Oral (06/15 1152) BP: 98/34 mmHg (06/15 1345) Pulse Rate: 112 (06/15 1345)  Labs:  Recent Labs  09/29/13 0330  09/30/13 0410 09/30/13 1625 10/01/13 0430 10/01/13 1205  HGB 8.9*  --  8.5*  --  8.3*  --   HCT 30.5*  --  28.4*  --  27.5*  --   PLT 260  --  252  --  261  --   APTT 61*  --  68*  --  54* 56*  LABPROT 23.5*  --   --   --   --   --   INR 2.17*  --   --   --   --   --   CREATININE 2.09*  < > 1.93* 2.20* 1.84*  --   < > = values in this interval not displayed.  Estimated Creatinine Clearance: 50.9 ml/min (by C-G formula based on Cr of 1.84).  Assessment: 62 YOF with right foot ischemia s/p femoral endarterectomy and thrombectomy on 09/17/13.  S/p fasciotomy closure on 09/19/13. HIT panel negative. Now on CVVHD.  Argatroban therapeutic aPTT 56sec on 0.73mcg/kg/min, CBC stable, no bleeding noted.    . INR can be falsely elevated due to argatroban. Will need 5 day overlap with warfarin and argatroban when restarted, and argatroban can be held when INR >= 4 and rechecked to determine actual INR.     I&D for wound infection 6/12 now with wound vac- hold warfarin until after vac off and I&D complete, pharmacy to continue to follow for warfarin restart orders.  Goal of Therapy:  aPTT 50 - 90 seconds Monitor platelets by anticoagulation protocol: Yes   Plan 1. Continue Argatroban at 0.4 mcg/kg/min 2. Daily aPTT  Erin Hearing PharmD., BCPS Clinical Pharmacist Pager [phone] 10/01/2013 2:05  PM

## 2013-10-01 NOTE — Progress Notes (Addendum)
  [facility] Progress Note  10/01/2013 8:17 AM 3 Days Post-Op  Subjective:  Pt doing well this am. Pain in right groin has improved.   Tmax 97.7 BP  sys 70s-90s 02 100% RA  Filed Vitals:   10/01/13 0800  BP: 97/65  Pulse: 106  Temp:   Resp: 16    Physical Exam: Incisions: Right groin with wound vac. Seal in place with. Sanguinous drainage. Right lower leg fasciotomy staple lines c/d/i.  Extremities:  Doppler signals bilaterally.   CBC    Component Value Date/Time   WBC 8.2 10/01/2013 0430   RBC 2.85* 10/01/2013 0430   HGB 8.3* 10/01/2013 0430   HCT 27.5* 10/01/2013 0430   PLT 261 10/01/2013 0430   MCV 96.5 10/01/2013 0430   MCH 29.1 10/01/2013 0430   MCHC 30.2 10/01/2013 0430   RDW 17.7* 10/01/2013 0430   LYMPHSABS 1.4 08/04/2013 1223   MONOABS 0.4 08/04/2013 1223   EOSABS 0.2 08/04/2013 1223   BASOSABS 0.1 08/04/2013 1223    BMET    Component Value Date/Time   NA 135* 10/01/2013 0430   K 4.4 10/01/2013 0430   CL 96 10/01/2013 0430   CO2 25 10/01/2013 0430   GLUCOSE 219* 10/01/2013 0430   BUN 18 10/01/2013 0430   CREATININE 1.84* 10/01/2013 0430   CREATININE 4.17* 07/06/2013 1618   CALCIUM 8.2* 10/01/2013 0430   GFRNONAA 32* 10/01/2013 0430   GFRNONAA 71 07/13/2011 0835   GFRAA 37* 10/01/2013 0430   GFRAA 82 07/13/2011 0835    INR    Component Value Date/Time   INR 2.17* 09/29/2013 0330     Intake/Output Summary (Last 24 hours) at 10/01/13 0817 Last data filed at 10/01/13 0800  Gross per 24 hour  Intake  938.1 ml  Output   5546 ml  Net -4607.9 ml     Assessment:  43 y.o. female is s/p:  Right groin wound debridement   3 Days Post-Op  Plan: -Right groin draining with wound vac. Wound vac dressing change today at the bedside.  -Doppler signals bilaterally. Legs look viable.  -DVT prophylaxis:  argatroban   Virgina Jock, PA-C [facility] Office: [phone] Pager: [phone] 10/01/2013 8:17 AM    Wound vac  changed with [place] RN.  Wound with fibrinous tissue and some granulation tissue.  Continue wound vac changes M/W/F.  Pt tolerated vac change well.  Leontine Locket 10/01/2013 12:45 PM   I agree with the above.  I was not present for the [facility] change but did see the patient earlier today.  Hopefully I will be able to evaluate the wound on Wednesday  Wells Brabham

## 2013-10-02 DIAGNOSIS — G253 Myoclonus: Secondary | ICD-10-CM

## 2013-10-02 LAB — RENAL FUNCTION PANEL
ALBUMIN: 1.9 g/dL — AB (ref 3.5–5.2)
ALBUMIN: 1.8 g/dL — AB (ref 3.5–5.2)
BUN: 18 mg/dL (ref 6–23)
BUN: 20 mg/dL (ref 6–23)
CALCIUM: 7.8 mg/dL — AB (ref 8.4–10.5)
CO2: 25 meq/L (ref 19–32)
CO2: 26 mEq/L (ref 19–32)
CREATININE: 2.06 mg/dL — AB (ref 0.50–1.10)
Calcium: 8.1 mg/dL — ABNORMAL LOW (ref 8.4–10.5)
Chloride: 95 mEq/L — ABNORMAL LOW (ref 96–112)
Chloride: 98 mEq/L (ref 96–112)
Creatinine, Ser: 2.04 mg/dL — ABNORMAL HIGH (ref 0.50–1.10)
GFR calc Af Amer: 32 mL/min — ABNORMAL LOW (ref >90)
GFR calc Af Amer: 33 mL/min — ABNORMAL LOW (ref >90)
GFR calc non Af Amer: 28 mL/min — ABNORMAL LOW (ref >90)
GFR, EST NON AFRICAN AMERICAN: 28 mL/min — AB (ref >90)
GLUCOSE: 332 mg/dL — AB (ref 70–99)
Glucose, Bld: 271 mg/dL — ABNORMAL HIGH (ref 70–99)
PHOSPHORUS: 1.9 mg/dL — AB (ref 2.3–4.6)
POTASSIUM: 4.6 meq/L (ref 3.7–5.3)
Phosphorus: 2.4 mg/dL (ref 2.3–4.6)
Potassium: 4.9 mEq/L (ref 3.7–5.3)
Sodium: 134 mEq/L — ABNORMAL LOW (ref 137–147)
Sodium: 135 mEq/L — ABNORMAL LOW (ref 137–147)

## 2013-10-02 LAB — GLUCOSE, CAPILLARY
GLUCOSE-CAPILLARY: 377 mg/dL — AB (ref 70–99)
GLUCOSE-CAPILLARY: 388 mg/dL — AB (ref 70–99)
Glucose-Capillary: 273 mg/dL — ABNORMAL HIGH (ref 70–99)
Glucose-Capillary: 356 mg/dL — ABNORMAL HIGH (ref 70–99)

## 2013-10-02 LAB — CBC
HEMATOCRIT: 26.7 % — AB (ref 36.0–46.0)
Hemoglobin: 8 g/dL — ABNORMAL LOW (ref 12.0–15.0)
MCH: 28.6 pg (ref 26.0–34.0)
MCHC: 30 g/dL (ref 30.0–36.0)
MCV: 95.4 fL (ref 78.0–100.0)
PLATELETS: 282 10*3/uL (ref 150–400)
RBC: 2.8 MIL/uL — ABNORMAL LOW (ref 3.87–5.11)
RDW: 17.7 % — AB (ref 11.5–15.5)
WBC: 7.9 10*3/uL (ref 4.0–10.5)

## 2013-10-02 LAB — HEPATIC FUNCTION PANEL
ALK PHOS: 133 U/L — AB (ref 39–117)
ALT: 16 U/L (ref 0–35)
AST: 15 U/L (ref 0–37)
Albumin: 2 g/dL — ABNORMAL LOW (ref 3.5–5.2)
BILIRUBIN DIRECT: 0.2 mg/dL (ref 0.0–0.3)
BILIRUBIN INDIRECT: 0.4 mg/dL (ref 0.3–0.9)
BILIRUBIN TOTAL: 0.6 mg/dL (ref 0.3–1.2)
Total Protein: 6.2 g/dL (ref 6.0–8.3)

## 2013-10-02 LAB — CARBOXYHEMOGLOBIN
CARBOXYHEMOGLOBIN: 2.4 % — AB (ref 0.5–1.5)
Methemoglobin: 1.1 % (ref 0.0–1.5)
O2 Saturation: 63.2 %
Total hemoglobin: 8.2 g/dL — ABNORMAL LOW (ref 12.0–16.0)

## 2013-10-02 LAB — TSH: TSH: 2.35 u[IU]/mL (ref 0.350–4.500)

## 2013-10-02 LAB — APTT: APTT: 52 s — AB (ref 24–37)

## 2013-10-02 LAB — MAGNESIUM: Magnesium: 2.5 mg/dL (ref 1.5–2.5)

## 2013-10-02 MED ORDER — INSULIN GLARGINE 100 UNIT/ML ~~LOC~~ SOLN
40.0000 [IU] | SUBCUTANEOUS | Status: DC
  Administered 2013-10-03 – 2013-10-16 (×12): 40 [IU] via SUBCUTANEOUS
  Filled 2013-10-02 (×15): qty 0.4

## 2013-10-02 NOTE — Progress Notes (Addendum)
  [facility] Progress Note  10/02/2013 7:38 AM 4 Days Post-Op  Subjective:  No complaints  Afebrile 82'N-003'B systolic  HR  048'G-891'Q regular 100% RA  Gtts: Levophed Dobutamine Argatroban   ABx: Vanc Zosyn  Filed Vitals:   10/02/13 0730  BP: 95/62  Pulse:   Temp:   Resp:     Physical Exam: Incisions:  Wound vac to right groin with good seal Extremities:  Bilateral feet are warm with + doppler signal bilateral DP  CBC    Component Value Date/Time   WBC 7.9 10/02/2013 0355   RBC 2.80* 10/02/2013 0355   HGB 8.0* 10/02/2013 0355   HCT 26.7* 10/02/2013 0355   PLT 282 10/02/2013 0355   MCV 95.4 10/02/2013 0355   MCH 28.6 10/02/2013 0355   MCHC 30.0 10/02/2013 0355   RDW 17.7* 10/02/2013 0355   LYMPHSABS 1.4 08/04/2013 1223   MONOABS 0.4 08/04/2013 1223   EOSABS 0.2 08/04/2013 1223   BASOSABS 0.1 08/04/2013 1223    BMET    Component Value Date/Time   NA 134* 10/02/2013 0355   K 4.6 10/02/2013 0355   CL 95* 10/02/2013 0355   CO2 25 10/02/2013 0355   GLUCOSE 271* 10/02/2013 0355   BUN 18 10/02/2013 0355   CREATININE 2.06* 10/02/2013 0355   CREATININE 4.17* 07/06/2013 1618   CALCIUM 8.1* 10/02/2013 0355   GFRNONAA 28* 10/02/2013 0355   GFRNONAA 71 07/13/2011 0835   GFRAA 32* 10/02/2013 0355   GFRAA 82 07/13/2011 0835    INR    Component Value Date/Time   INR 2.17* 09/29/2013 0330     Intake/Output Summary (Last 24 hours) at 10/02/13 0738 Last data filed at 10/02/13 0700  Gross per 24 hour  Intake 2060.9 ml  Output   4410 ml  Net -2349.1 ml     Assessment:  43 y.o. female is s/p:  Right groin wound debridement   4 Days Post-Op  Plan: -continue wound vac-for vac change tomorrow -still requiring pressor support -DVT prophylaxis:  argatroban   Leontine Locket, PA-C [facility] [phone] 10/02/2013 7:38 AM

## 2013-10-02 NOTE — Progress Notes (Signed)
Inpatient Diabetes Program Recommendations  AACE/ADA: New Consensus Statement on Inpatient Glycemic Control (2013)  Target Ranges:  Prepandial:   less than 140 mg/dL      Peak postprandial:   less than 180 mg/dL (1-2 hours)      Critically ill patients:  140 - 180 mg/dL   Reason for Assessment:Results for Hannah [name], Hannah [name] (MRN [id number]) as of 10/02/2013 10:18  Ref. Range 10/01/2013 07:47 10/01/2013 11:53 10/01/2013 17:22 10/01/2013 21:48 10/02/2013 07:45  Glucose-Capillary Latest Range: 70-99 mg/dL 212 (H) 269 (H) 389 (H) 310 (H) 273 (H)    Current orders for Inpatient glycemic control: Lantus 35 units q 24 hours, Novolog moderate tid with meals and HS.  Note that CBG's are increasing.  May consider changing Novolog to q 4 hours and adding Novolog meal coverage 3 units tid with meals.   Adah Perl, RN, BC-ADM Inpatient Diabetes Coordinator Pager [phone]

## 2013-10-02 NOTE — Consult Note (Addendum)
NEURO HOSPITALIST CONSULT NOTE    Reason for Consult:abnormal limb movments  HPI:                                                                                                                                          Hannah [name] is an 43 y.o. female with PMH of DM1, dialysis dependent anuric AoCKD5, blindness, h/o lung CA s/p resection who was recently discharged for heart failure symptom had a high risk stress test which showed ischemia in anterior and apical region. She underwent cath 09/04/2013 which showed chronically occluded RCA and triple vessel dx. CT surgery consulted but not felt to be surgical candidate due to poor PFTs, suboptimal targets, comorbidities and shock. EF 22% by Myoview. 30-35% by echo. Underwent placement of swan and IABP for shock . Unfortunately developed cold foot and IABP had to be removed.Underwent R femoral thrombectomy and 4 compartment fasciotomy of RLE on 6/1.On 6/3 had VT/VF->PEA arrest 10 mins CPR. U/s shows R axillary DVT.  Currently on Argatroban.  Over the last five days she has complained of twitching which has effected her speech slightly. The twitching is in th legs >arms and acts as if her arms "just drop".  She is having a hard time with coordination and ability to bring things to her mouth.   Most recent  AST (09/23/13) 136 ALT (09/23/13)  289 Alk phos (09/23/13) 207  Today  Mg-2.5 Phos-2.4  Past Medical History  Diagnosis Date  . High cholesterol   . Diabetic retinopathy   . Peripheral neuropathy     "tips of toes"  . Blind right eye   . CHF (congestive heart failure)   . CAD (coronary artery disease)   . GERD (gastroesophageal reflux disease)   . Hypertension   . History of lung cancer 07/2011    s/p left lower lobectomy  . Diabetes mellitus     IDDM  . Cataract of right eye   . Ulcer of toe of right foot 07/10/2012    great toe  . Breast calcification, left 06/2012  . Acute biphenotypic leukemia   . CKD (chronic  kidney disease) stage 5, GFR less than 15 ml/min   . Nephrotic syndrome   . Gastritis     H/o gastritis on prior endoscopy  . Anemia   . Carotid artery disease     Past Surgical History  Procedure Laterality Date  . Cardiac catheterization  07/16/2011  . Incision and drainage breast abscess Left   . Tubal ligation  1994  . Vitrectomy  2010    2 on left, 1 on right  . Cesarean section  1991; 1994  . Video assisted thoracoscopy (vats)/ lobectomy Left 07/30/2011    left main thoracotomy, left lower lobectomy, mediastinal lymph node  dissection  . Lobectomy    . Colonoscopy with esophagogastroduodenoscopy (egd) N/A 08/14/2012    Procedure: COLONOSCOPY WITH ESOPHAGOGASTRODUODENOSCOPY (EGD);  Surgeon: Danie Binder, MD;  Location: [facility];  Service: Endoscopy;  Laterality: N/A;  10:45-moved to [address] to notify pt  . Breast lumpectomy with needle localization Left 11/14/2012    Procedure: BREAST LUMPECTOMY WITH NEEDLE LOCALIZATION;  Surgeon: Marcello Moores A. Cornett, MD;  Location: [place];  Service: General;  Laterality: Left;  . Insertion of dialysis catheter Left 09/12/2013    Procedure: INSERTION OF DIALYSIS CATHETER;  Surgeon: Mal Misty, MD;  Location: [place];  Service: Vascular;  Laterality: Left;  . Embolectomy Right 09/17/2013    Procedure: Thrombectomy of Right Common Femoral Artery;  Surgeon: Elam Dutch, MD;  Location: [facility] OR;  Service: Vascular;  Laterality: Right;  . Endarterectomy femoral Right 09/17/2013    Procedure: Right Femoral Endarterectomy;  Surgeon: Elam Dutch, MD;  Location: [facility] OR;  Service: Vascular;  Laterality: Right;  . Patch angioplasty Right 09/17/2013    Procedure: Vein Patch Angioplasty of Right Femoral Artery;  Surgeon: Elam Dutch, MD;  Location: [place];  Service: Vascular;  Laterality: Right;  . Fasciotomy Right 09/17/2013    Procedure: Four Compartment Fasciotomy;  Surgeon: Elam Dutch, MD;  Location: [place];  Service: Vascular;  Laterality:  Right;  . Fasciotomy closure Right 09/19/2013    Procedure: FASCIOTOMY CLOSURE;  Surgeon: Elam Dutch, MD;  Location: [place];  Service: Vascular;  Laterality: Right;  regional block and monitored anesthesia care used  . I&d extremity Right 09/28/2013    Procedure: IRRIGATION AND DEBRIDEMENT EXTREMITY;  Surgeon: Serafina Mitchell, MD;  Location: [facility] OR;  Service: Vascular;  Laterality: Right;  . Application of wound vac Right 09/28/2013    Procedure: APPLICATION OF WOUND VAC;  Surgeon: Serafina Mitchell, MD;  Location: [facility] OR;  Service: Vascular;  Laterality: Right;    Family History  Problem Relation Age of Onset  . Coronary artery disease Father   . Asthma Father   . COPD Father   . Hypertension Father   . Hyperlipidemia Father   . Diabetes Father   . Congestive Heart Failure Father   . Heart disease Father     before age 20  . Heart attack Father   . Peripheral vascular disease Father   . Hypertension Mother   . Hyperlipidemia Mother   . Diabetes Mother   . Cancer Mother   . Cancer Maternal Aunt      three aunts, bone, breast, ?  . Hypertension Brother   . Diabetes Brother   . Diabetes Sister   . Colon cancer Neg Hx   . Celiac disease Neg Hx   . Crohn's disease Neg Hx   . Ulcerative colitis Neg Hx   . Lung cancer Maternal Grandmother     Social History:  reports that she quit smoking about 2 years ago. She has never used smokeless tobacco. She reports that she does not drink alcohol or use illicit drugs.  Allergies  Allergen Reactions  . Crestor [Rosuvastatin] Other (See Comments)    Severe muscle weakness  . Nsaids Other (See Comments)    Not allergic, "bad on my kidneys"  . Ciprofloxacin Rash    MEDICATIONS:  Prior to Admission:  Prescriptions prior to admission  Medication Sig Dispense Refill  . acetaminophen (TYLENOL) 500 MG tablet Take 500 mg by  mouth every 8 (eight) hours as needed for mild pain.      Marland Kitchen aspirin EC 81 MG EC tablet Take 1 tablet (81 mg total) by mouth daily.  30 tablet  0  . cholecalciferol (VITAMIN D) 1000 UNITS tablet Take 1,000 Units by mouth daily.      . cyclobenzaprine (FLEXERIL) 10 MG tablet Take 5-10 mg by mouth 3 (three) times daily as needed for muscle spasms.      . Insulin Detemir (LEVEMIR FLEXPEN) 100 UNIT/ML Pen Inject 34 Units into the skin every evening.  15 mL  11  . insulin lispro (HUMALOG KWIKPEN) 100 UNIT/ML KiwkPen Inject 0.03 mLs (3 Units total) into the skin 3 (three) times daily with meals.  15 mL  0  . methylcellulose (ARTIFICIAL TEARS) 1 % ophthalmic solution Place 1 drop into both eyes at bedtime as needed (dry eyes).      . metolazone (ZAROXOLYN) 5 MG tablet Take 1 tablet (5 mg total) by mouth daily as needed.  30 tablet  0  . metoprolol succinate (TOPROL-XL) 25 MG 24 hr tablet Take 1 tablet (25 mg total) by mouth daily.  30 tablet  6  . potassium chloride SA (K-DUR,KLOR-CON) 20 MEQ tablet Take 1 tablet (20 mEq total) by mouth 2 (two) times daily.  60 tablet  0  . pravastatin (PRAVACHOL) 20 MG tablet Take 1 tablet (20 mg total) by mouth daily at 6 PM.  30 tablet  0  . torsemide (DEMADEX) 20 MG tablet Take 2 tablets (40 mg total) by mouth 2 (two) times daily.  90 tablet  0   Scheduled: . aspirin  81 mg Oral Daily  . atorvastatin  80 mg Oral q1800  . calcitRIOL  0.25 mcg Oral Daily  . darbepoetin (ARANESP) injection - NON-DIALYSIS  200 mcg Subcutaneous Q Wed-1800  . feeding supplement (NEPRO CARB STEADY)  237 mL Oral TID WC  . insulin aspart  0-15 Units Subcutaneous TID WC  . insulin aspart  0-5 Units Subcutaneous QHS  . insulin glargine  35 Units Subcutaneous Q24H  . lidocaine (PF)  5 mL Other Once  . midodrine  10 mg Oral TID WC  . pantoprazole  40 mg Oral Daily  . piperacillin-tazobactam (ZOSYN)  IV  3.375 g Intravenous 3 times per day  . polyethylene glycol  17 g Oral Daily  . sodium  chloride  10-40 mL Intracatheter Q12H  . vancomycin  1,000 mg Intravenous Q24H   Continuous: . sodium chloride Stopped (10/01/13 2000)  . argatroban 0.4 mcg/kg/min (10/02/13 1100)  . DOBUTamine 6 mcg/kg/min (10/02/13 1100)  . norepinephrine (LEVOPHED) Adult infusion 4 mcg/min (10/02/13 1100)  . dialysis replacement fluid (prismasate) 300 mL/hr at 10/01/13 0749  . dialysis replacement fluid (prismasate) 200 mL/hr at 10/01/13 2122  . dialysate (PRISMASATE) 1,500 mL/hr at 10/02/13 1025     ROS:  History obtained from the patient  General ROS: negative for - chills, fatigue, fever, night sweats, weight gain or weight loss Psychological ROS: negative for - behavioral disorder, hallucinations, memory difficulties, mood swings or suicidal ideation Ophthalmic ROS: negative for - blurry vision, double vision, eye pain or loss of vision ENT ROS: negative for - epistaxis, nasal discharge, oral lesions, sore throat, tinnitus or vertigo Allergy and Immunology ROS: negative for - hives or itchy/watery eyes Hematological and Lymphatic ROS: negative for - bleeding problems, bruising or swollen lymph nodes Endocrine ROS: negative for - galactorrhea, hair pattern changes, polydipsia/polyuria or temperature intolerance Respiratory ROS: negative for - cough, hemoptysis, shortness of breath or wheezing Cardiovascular ROS: negative for - chest pain, dyspnea on exertion, edema or irregular heartbeat Gastrointestinal ROS: negative for - abdominal pain, diarrhea, hematemesis, nausea/vomiting or stool incontinence Genito-Urinary ROS: negative for - dysuria, hematuria, incontinence or urinary frequency/urgency Musculoskeletal ROS: negative for - joint swelling or muscular weakness Neurological ROS: as noted in HPI Dermatological ROS: negative for rash and skin lesion  changes   Blood pressure 107/59, pulse 109, temperature 97.7 F (36.5 C), temperature source Oral, resp. rate 18, height 5' 7.5" (1.715 m), weight 114.5 kg (252 lb 6.8 oz), last menstrual period 08/19/2013, SpO2 99.00%.   Neurologic Examination:                                                                                                      Mental Status: Alert, oriented, thought content appropriate.  Speech fluent without evidence of aphasia.  Able to follow 3 step commands without difficulty. Cranial Nerves: II: Discs flat bilaterally; blind in right eye but able to see on all fields when looking with left eye, left pupil, round, reactive to light right pupil is noncreative.  III,IV, VI: ptosis not present, extra-ocular motions intact bilaterally with the right eye deviated laterally at rest and more pronounced deviation laterally with upward gaze.  V,VII: smile symmetric, facial light touch sensation normal bilaterally VIII: hearing normal bilaterally IX,X: gag reflex present XI: bilateral shoulder shrug XII: midline tongue extension without atrophy or fasciculations  Motor: Right : Upper extremity   5/5    Left:     Upper extremity   5/5  Lower extremity   5/5     Lower extremity   5/5 (unable to DF--old) --when arms held outstretched myoclonic activity noted.   --When legs are held out bilateral myoclonic activity is noted.  She does have some positive myoclonus at rest as well.   Sensory: Pinprick and light touch intact throughout, bilaterally with stocking distribution neuropathy Deep Tendon Reflexes:  Right: Upper Extremity   Left: Upper extremity   biceps (C-5 to C-6) 2/4   biceps (C-5 to C-6) 2/4 tricep (C7) 2/4    triceps (C7) 2/4 Brachioradialis (C6) 2/4  Brachioradialis (C6) 2/4  Lower Extremity Lower Extremity  quadriceps (L-2 to L-4) 0/4   quadriceps (L-2 to L-4) 0/4 Achilles (S1) 0/4   Achilles (S1) 0/4  Plantars: Right: downgoing   Left:  downgoing Cerebellar: normal finger-to-nose,  normal heel-to-shin test  Gait: not tested CV: pulses palpable throughout    Lab Results: Basic Metabolic Panel:  Recent Labs Lab 09/28/13 0340  09/29/13 0330  09/30/13 0410 09/30/13 1625 10/01/13 0430 10/01/13 1605 10/02/13 0355  NA 134*  < > 134*  < > 136* 133* 135* 132* 134*  K 4.8  < > 4.7  < > 4.3 4.6 4.4 4.9 4.6  CL 97  < > 97  < > 96 96 96 96 95*  CO2 25  < > 24  < > 24 25 25 26 25   GLUCOSE 280*  < > 275*  < > 244* 377* 219* 415* 271*  BUN 17  < > 16  < > 18 23 18 21 18   CREATININE 2.14*  < > 2.09*  < > 1.93* 2.20* 1.84* 2.32* 2.06*  CALCIUM 7.9*  < > 8.0*  < > 8.2* 7.8* 8.2* 7.9* 8.1*  MG 2.4  --  2.4  --  2.6*  --  2.4  --  2.5  PHOS 2.4  < > 2.3  < > 1.6* 1.9* 1.8* 2.4 2.4  < > = values in this interval not displayed.  Liver Function Tests:  Recent Labs Lab 09/30/13 0410 09/30/13 1625 10/01/13 0430 10/01/13 1605 10/02/13 0355  ALBUMIN 1.9* 1.8* 1.8* 1.8* 1.9*   No results found for this basename: LIPASE, AMYLASE,  in the last 168 hours No results found for this basename: AMMONIA,  in the last 168 hours  CBC:  Recent Labs Lab 09/28/13 0340 09/28/13 1624 09/29/13 0330 09/30/13 0410 10/01/13 0430 10/02/13 0355  WBC 8.1  --  8.5 8.3 8.2 7.9  HGB 9.4* 9.9* 8.9* 8.5* 8.3* 8.0*  HCT 31.6* 29.0* 30.5* 28.4* 27.5* 26.7*  MCV 96.9  --  97.8 96.9 96.5 95.4  PLT 240  --  260 252 261 282    Cardiac Enzymes: No results found for this basename: CKTOTAL, CKMB, CKMBINDEX, TROPONINI,  in the last 168 hours  Lipid Panel: No results found for this basename: CHOL, TRIG, HDL, CHOLHDL, VLDL, LDLCALC,  in the last 168 hours  CBG:  Recent Labs Lab 10/01/13 0747 10/01/13 1153 10/01/13 1722 10/01/13 2148 10/02/13 0745  GLUCAP 212* 90* 389* 310* 39*    Microbiology: Results for orders placed during the hospital encounter of 09/03/13  SURGICAL PCR SCREEN     Status: None   Collection Time    09/05/13  6:21  PM      Result Value Ref Range Status   MRSA, PCR NEGATIVE  NEGATIVE Final   Staphylococcus aureus NEGATIVE  NEGATIVE Final   Comment:            The Xpert SA Assay (FDA     approved for NASAL specimens     in patients over 72 years of age),     is one component of     a comprehensive surveillance     program.  Test performance has     been validated by [employer] for patients greater     than or equal to 45 year old.     It is not intended     to diagnose infection nor to     guide or monitor treatment.  CULTURE, BLOOD (ROUTINE X 2)     Status: None   Collection Time    09/19/13  9:25 PM      Result Value Ref Range Status   Specimen Description BLOOD HEMODIALYSIS CATHETER  Final   Special Requests BOTTLES DRAWN AEROBIC ONLY 10CC   Final   Culture  Setup Time     Final   Value: 09/20/2013 00:44     Performed at [facility]   Culture     Final   Value: NO GROWTH 5 DAYS     Performed at [facility]   Report Status 09/26/2013 FINAL   Final  CULTURE, BLOOD (ROUTINE X 2)     Status: None   Collection Time    09/19/13  9:35 PM      Result Value Ref Range Status   Specimen Description BLOOD LEFT HAND   Final   Special Requests BOTTLES DRAWN AEROBIC ONLY 5CC   Final   Culture  Setup Time     Final   Value: 09/20/2013 00:43     Performed at [facility]   Culture     Final   Value: NO GROWTH 5 DAYS     Performed at [facility]   Report Status 09/26/2013 FINAL   Final  WOUND CULTURE     Status: None   Collection Time    09/26/13 11:17 AM      Result Value Ref Range Status   Specimen Description WOUND RIGHT LEG   Final   Special Requests NONE   Final   Gram Stain     Final   Value: MODERATE WBC PRESENT,BOTH PMN AND MONONUCLEAR     NO SQUAMOUS EPITHELIAL CELLS SEEN     MODERATE GRAM NEGATIVE RODS     FEW GRAM POSITIVE COCCI     IN PAIRS FEW GRAM POSITIVE RODS   Culture     Final   Value: MULTIPLE ORGANISMS PRESENT, NONE PREDOMINANT      Note: NO STAPHYLOCOCCUS AUREUS ISOLATED NO GROUP A STREP (S.PYOGENES) ISOLATED     Performed at [facility]   Report Status 09/28/2013 FINAL   Final    Coagulation Studies: No results found for this basename: LABPROT, INR,  in the last 72 hours  Imaging: No results found.   Assessment and plan per attending neurologist  Etta Quill PA-C [facility] [phone]  10/02/2013, 11:09 AM  I have seen and evaluated the patient. I have reviewed the above note and made appropriate changes.   Assessment/Plan: 43 YO female with ESRD dialysis dependent who recently suffered cardiogenic shock then VT/VF PEA arrest for 10 minutes. Over the last 5 days she has had increased myoclonic activity more noted in the lower limbs. Her medications do not reveal an obvious cause.   Piperacillin was started in this time frame and has been described as causing myoclonus in case-reports, though it is not common. Other possibilities would be liver dysfunction from her shock liver, myoclonus 2/2 ESRD, lance-adams syndrome(though usually focal).   Most toxic-metabolic myoclonus is cortical in which case depakote can be helpful. Clonazepam is more helpful for subcortical myoclonus.   1) EEG to determine if cortical or subcortical myoclonus 2) would consider depakote or clonazepam following EEG.  3) Would consider changing zosyn if possible given the timing of myoclonus and starting it.  4) will continue to follow.     Roland Rack, MD [facility] [phone]  If 7pm- 7am, please page neurology on call as listed in [place].

## 2013-10-02 NOTE — Progress Notes (Addendum)
Pharmacy Consult Note  Pharmacy Consult:  Argatroban  Indication:  Thromboembolic state  Pharmacy Consult : Vancomycin and zosyn Indication: groin wound infection  Allergies  Allergen Reactions  . Crestor [Rosuvastatin] Other (See Comments)    Severe muscle weakness  . Nsaids Other (See Comments)    Not allergic, "bad on my kidneys"  . Ciprofloxacin Rash    Patient Measurements: Height: 5' 7.5" (171.5 cm) Weight: 252 lb 6.8 oz (114.5 kg) IBW/kg (Calculated) : 62.75  Vital Signs: Temp: 97.7 F (36.5 C) (06/16 0743) Temp src: Oral (06/16 0743) BP: 87/51 mmHg (06/16 1000) Pulse Rate: 107 (06/16 1000)  Labs:  Recent Labs  09/30/13 0410  10/01/13 0430 10/01/13 1205 10/01/13 1605 10/02/13 0355  HGB 8.5*  --  8.3*  --   --  8.0*  HCT 28.4*  --  27.5*  --   --  26.7*  PLT 252  --  261  --   --  282  APTT 68*  --  54* 56*  --  52*  CREATININE 1.93*  < > 1.84*  --  2.32* 2.06*  < > = values in this interval not displayed.  Estimated Creatinine Clearance: 45.5 ml/min (by C-G formula based on Cr of 2.06).  Assessment: 89 YOF with right foot ischemia s/p femoral endarterectomy and thrombectomy on 09/17/13.  S/p fasciotomy closure on 09/19/13. HIT panel negative. Now on CVVHD.  Argatroban therapeutic aPTT 52sec on 0.10mcg/kg/min,cbc stable, no bleeding noted.    . INR can be falsely elevated due to argatroban. Will need 5 day overlap with warfarin and argatroban when restarted, and argatroban can be held when INR >= 4 and rechecked to determine actual INR.     I&D for wound infection 6/12 now with wound vac- hold warfarin until after vac off and I&D complete, pharmacy to continue to follow for warfarin restart orders. Noted plans for wound vac change 6/17.  Groin site infection: no fevers noted, wbc normal. Patient currently on day #6 of vancomycin and #3 of zosyn. Patient continues on CRRT, antibiotics have been dose adjusted accordingly. Last vanc trough was 25.7 on 6/13. Will  plan on rechecking later in the week.  6/3 bld - ng 6/10 wound (leg) - multiple bacteria  Goal of Therapy:  aPTT 50 - 90 seconds Monitor platelets by anticoagulation protocol: Yes   Plan 1. Continue Argatroban at 0.4 mcg/kg/min 2. Daily aPTT 3. Follow up plan for coumadin 4. Continue vancomycin 1g IV q24 hous 5. Continue zosyn 3.375g q8 hours  Erin Hearing PharmD., BCPS Clinical Pharmacist Pager [phone] 10/02/2013 11:03 AM

## 2013-10-02 NOTE — Progress Notes (Signed)
ADVANCED HEART FAILURE ROUNDING NOTE   SUBJECTIVE  43 yo female with PMH of DM1, stage V kidney disease, blindness, h/o lung CA s/p resection who was recently discharged for heart failure symptom had a high risk stress test which showed ischemia in anterior and apical region. She underwent cath 09/04/2013 which showed chronically occluded RCA and triple vessel dx. CT surgery consulted but not felt to be surgical candidate due to poor PFTs, suboptimal targets, comorbidities and shock. EF 22% by Myoview. 30-35% by echo  Underwent placement of swan and IABP for shock (co-ox 38%). Unfortunately developed cold foot and IABP had to be removed.Underwent R femoral thrombectomy and 4 compartment fasciotomy of RLE on 6/1.On 6/3 had VT/VF->PEA arrest 10 mins CPR. U/s shows R axillary DVT  Continues on CVVHD. Weight unchanged. CVP 4 so Renal team running even. Levophed stuck at 4. Also on dobutamine 5. On midodrine 10 tid. Underwent R groin wound debridement with wound vac change yesterday  CBGs still high    CURRENT MEDS . aspirin  81 mg Oral Daily  . atorvastatin  80 mg Oral q1800  . calcitRIOL  0.25 mcg Oral Daily  . darbepoetin (ARANESP) injection - NON-DIALYSIS  200 mcg Subcutaneous Q Wed-1800  . feeding supplement (NEPRO CARB STEADY)  237 mL Oral TID WC  . insulin aspart  0-15 Units Subcutaneous TID WC  . insulin aspart  0-5 Units Subcutaneous QHS  . [START ON 10/03/2013] insulin glargine  40 Units Subcutaneous Q24H  . lidocaine (PF)  5 mL Other Once  . midodrine  10 mg Oral TID WC  . pantoprazole  40 mg Oral Daily  . piperacillin-tazobactam (ZOSYN)  IV  3.375 g Intravenous 3 times per day  . polyethylene glycol  17 g Oral Daily  . sodium chloride  10-40 mL Intracatheter Q12H  . vancomycin  1,000 mg Intravenous Q24H    OBJECTIVE  Filed Vitals:   10/02/13 1214 10/02/13 1215 10/02/13 1230 10/02/13 1245  BP:  137/79 137/75 142/67  Pulse:  105 104 104  Temp: 97.4 F (36.3 C)      TempSrc: Oral     Resp:  14 11 26   Height:      Weight:      SpO2:  100% 100% 100%    Intake/Output Summary (Last 24 hours) at 10/02/13 1328 Last data filed at 10/02/13 1200  Gross per 24 hour  Intake 1268.2 ml  Output   3764 ml  Net -2495.8 ml   Filed Weights   09/30/13 0500 10/01/13 0648 10/02/13 0500  Weight: 117.6 kg (259 lb 4.2 oz) 114.6 kg (252 lb 10.4 oz) 114.5 kg (252 lb 6.8 oz)    PHYSICAL EXAM General:Lying in bed.  NAD Neuro: Alert and oriented X 3. Moves all extremities spontaneous HEENT:  Normal  Neck: Supple without bruits. Chest L subclav PC. R subclav TLC. Unable to see JVD Lungs:  clear Heart: tachy regular. +s3 Abdomen: Soft, non-tender, non-distended, BS + x 4.  Extremities: tr-1+ edema. R foot. Warm viable. Weak pulses Wound vac in R groin  CBC  Recent Labs  10/01/13 0430 10/02/13 0355  WBC 8.2 7.9  HGB 8.3* 8.0*  HCT 27.5* 26.7*  MCV 96.5 95.4  PLT 261 793   Basic Metabolic Panel  Recent Labs  10/01/13 0430 10/01/13 1605 10/02/13 0355  NA 135* 132* 134*  K 4.4 4.9 4.6  CL 96 96 95*  CO2 25 26 25   GLUCOSE 219* 415* 271*  BUN 18  21 18  CREATININE 1.84* 2.32* 2.06*  CALCIUM 8.2* 7.9* 8.1*  MG 2.4  --  2.5  PHOS 1.8* 2.4 2.4   Liver Function Tests  Recent Labs  10/02/13 0355 10/02/13 1208  AST  --  15  ALT  --  16  ALKPHOS  --  133*  BILITOT  --  0.6  PROT  --  6.2  ALBUMIN 1.9* 2.0*   Cardiac Enzymes No results found for this basename: CKTOTAL, CKMB, CKMBINDEX, TROPONINI,  in the last 72 hours Thyroid Function Tests  Recent Labs  10/02/13 1208  TSH 2.350    TELE  Sinus tachycardia 100-110   Radiology/Studies  Dg Chest 2 View  09/06/2013   CLINICAL DATA:  CHF, intermittent dyspnea  EXAM: CHEST  2 VIEW  COMPARISON:  Prior chest x-ray 08/13/2013; prior chest CT 08/22/2013  FINDINGS: Stable cardiac and mediastinal contours. Atherosclerotic calcifications are present within the transverse aorta. Moderate left  and small right pleural effusions are similar compared to prior. There is persistent associated bibasilar atelectasis. Mild pulmonary vascular congestion without overt edema. No pneumothorax. No new focal airspace consolidation. No acute osseous abnormality.  IMPRESSION: 1. Stable moderate left and small right pleural effusions and associated bibasilar atelectasis. 2. Pulmonary vascular congestion without overt edema.   Electronically Signed   By: Jacqulynn Cadet M.D.   On: 09/06/2013 07:55   Ct Chest Wo Contrast  08/22/2013   CLINICAL DATA:  Left lung cancer status post lobectomy. Weight gain. Renal insufficiency.  EXAM: CT CHEST WITHOUT CONTRAST  TECHNIQUE: Multidetector CT imaging of the chest was performed following the standard protocol without IV contrast.  COMPARISON:  Radiographs 08/13/2013 and 08/04/2013.  CT 08/02/2012.  FINDINGS: There are stable postsurgical changes related to prior left lower lobe resection. There has been interval enlargement of several mediastinal lymph nodes. These include 12 mm right paratracheal (image 19), 13 mm precarinal (image 23), and 14 mm AP window (image 25) lymph nodes. Some of these nodes have retained fatty hila. Allowing for the limitations of noncontrast technique, the hila appear stable.  There is stable low-density within the right thyroid lobe. Atherosclerosis of the aorta, great vessels and coronary arteries is noted. The heart size is normal. There is no pericardial effusion.  Moderate size dependent pleural effusions are present bilaterally. There is associated dependent airspace disease in both lower lobes. In addition, there are more focal airspace opacities within the right lower lobe and inferior aspect of the left upper lobe. The latter is somewhat nodular, measuring up to 1.8 cm on image 43. No other focal nodularity or endobronchial lesions are demonstrated.  The visualized upper abdomen has a stable appearance. There is no adrenal mass. There is  increased subcutaneous edema throughout the subcutaneous fat, especially within the anterior aspect of the upper abdomen.  No worrisome osseous findings are demonstrated.  IMPRESSION: 1. As demonstrated radiographically, there are bilateral pleural effusions and bilateral airspace opacities which are new compared with the prior CT. Although there are focal somewhat nodular components in the right lower and left upper lobes, these findings are most likely infectious/inflammatory. 2. Progressive mediastinal lymphadenopathy. Some of the nodes have retained fatty hila and may be reactive. Metastatic disease cannot be completely excluded. 3. Stable atherosclerosis and low-density within the right thyroid lobe. 4. If the airspace opacities and pleural effusions fail to respond to appropriate clinical therapy, follow-up CT or PET-CT may be warranted to exclude metastatic disease.   Electronically Signed   By: Rush Landmark  Lin Landsman M.D.   On: 08/22/2013 10:26   Dg Chest Portable 1 View  08/13/2013   CLINICAL DATA:  Shortness of breath increasing for 2 weeks. History of CHF.  EXAM: PORTABLE CHEST - 1 VIEW  COMPARISON:  08/04/2013  FINDINGS: Again noted are bibasilar lung densities that are suggestive for pleural effusions and atelectasis. Heart size appears to be enlarged. There is some peribronchial thickening and cannot exclude pulmonary edema.  IMPRESSION: Bilateral pleural effusions with basilar atelectasis. There is mild pulmonary edema. Minimal change from the previous examination.   Electronically Signed   By: Markus Daft M.D.   On: 08/13/2013 23:10    ASSESSMENT AND PLAN 1. Cardiogenic shock 2. Acute on chronic systolic HF     EF ~46-95% 3. 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 Acute respiratory failure 5. 3-V CAD 6. Carotid stenosis     - doppler 08/31/2013 shows R 60-79% ICA stenosis, L 80-99% ICA stenosis     - vascular surgery aware 7. Lung CA  h/o LLL lobectomy (2 yr ago due to non-small cell carcinoma)      - PFT  09/05/2013 pre-FEV1 28%, pre-FVC 34%, pre-FEV1/FVC ratio 68 8. DM1 9. Ischemic R foot s/p R femoral thrombectomy and 4-compartment fasciotomy.  10. VT/VF arrest 6/3 11. R axillary DVT 12. Severe protein calorie malnutrition  13. Large right groin wound s/p debridement 6/12  Volume status looks great. Unfortunately unable to wean levophed completely. Will titrate dobutamine to see if this will help get levo off.. However in order to permit outpt HD will also need to wean dobutamine which may or may not be possible.  Wound management per [facility] and Wound Care. Continue abx for now. I am hopeful we can start coumadin soon.   CBGs high. I will increase lantus.   Continues with severe twitching. No benefit from stopping amio. Zosyn dose changed. Electrolytes ok. Have asked Neuro to see.   Daniel Bensimhon,MD 1:28 PM

## 2013-10-02 NOTE — Progress Notes (Signed)
Assessment:  1. Dialysis dependent anuric AoCKD5, now ESRD, cardiogenic shock, cont CRRT-- weight down 131->114.5 with CRRT; unable to pull fluid or wean pressors further over past 24 hours; will keep volume even for the next 24 hours or so.  As previously stated will have to be able to maintain BP off systemic pressors and inotropes in order to transition to intermittent HD. Like Dr. Jeffie Pollock, I am not sure this will be possible... 2. Hypophosphatemia - s/p 10 mmoles IV 6/15; phos 2.4 today 3. 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 S/p R femoral endarterectomy wound complications, thrombectomy , and fasciotomy - wound VAC in place; notes reviewed. On vanco and zosyn  6. CAD, ischemic CM 7. Anemia - on darbe 200/week; not at goal; iron studies appear "adequate"  8. Twitching - etiology of this not obvious to me... 9. Lung CA with prior LLLobectomy 10. Right axillary DVT 11. Bilateral carotid stenosis 12. Diabetes - insulin  Subjective: Interval History: Complains of twitching as she has for several days Affecting her speech some  Eating breakfast Only pain is in groin BP dropped lower last PM when pressor weaned - sx of ringing in her ears Unable to pull any fluid last 24 hours  Objective: Vital signs in last 24 hours: Temp:  [97.5 F (36.4 C)-98.5 F (36.9 C)] 97.7 F (36.5 C) (06/16 0743) Pulse Rate:  [95-146] 109 (06/16 0830) Resp:  [11-27] 18 (06/16 0830) BP: (56-127)/(33-77) 91/77 mmHg (06/16 0830) SpO2:  [94 %-100 %] 100 % (06/16 0830) Weight:  [114.5 kg (252 lb 6.8 oz)] 114.5 kg (252 lb 6.8 oz) (06/16 0500) Weight change: -0.1 kg (-3.5 oz)  Current CRRT Prescription:  Start Date: restarted 09/20/13  Catheter: TDC left BFR: 200  Pre Blood Pump: 300 DFR: 1500  Replacement Rate: 200  Goal UF neg 100  Anticoagulation: argatroban systemic, clotted on heparin,  +HIT  Clotting: not excessive  WEIGHT TRENDING 10/02/13 0500 114.5 kg  10/01/13 0648 114.6 kg (252 lb 10.4 oz)   09/30/13 0500 117.6 kg (259 lb 4.2 oz) 09/29/13 0600 121.7 kg (268 lb 4.8 oz) 09/28/13 0600 123.8 kg (272 lb 14.9 oz) 09/27/13 0500 127.2 kg (280 lb 6.8 oz) 09/26/13 0500 130.9 kg (288 lb 9.3 oz) 09/25/13 0500 133.5 kg (294 lb 5 oz)  09/24/13 0600 134.5 kg (296 lb 8.3 oz) 09/23/13 0700 132.8 kg (292 lb 12.3 oz) 09/22/13 0500 131.1 kg (289 lb 0.4 oz) 09/21/13 0500 129.6 kg (285 lb 11.5 oz  09/20/13 0500 131 kg (288 lb 12.8 oz)  CRRT restarted  PHYSICAL EXAMINATION General appearance: alert and cooperative, sitting up in bed, NAD BP 91/77  Pulse 109  Temp(Src) 97.7 F (36.5 C) (Oral)  Resp 18  Ht 5' 7.5" (1.715 m)  Wt 114.5 kg (252 lb 6.8 oz)  BMI 38.93 kg/m2  SpO2 100%  LMP 08/19/2013 Mouth twitching and tremor hands with intention is unchanged Alert,oriented, pleasanat Resp: clear to auscultation bilaterally anteriorly  TDC dressing dry and intact Cardio: regular rate and rhythm, S1, S2 normal, no murmur, click, rub or gallop GI: soft, non-tender; bowel sounds normal; no masses,  no organomegaly Extremities: edema less edematous, RLE post op;  Wound VAC in place; stapled incisions clean and dry on LE's Dusky tips of toes right root #3-5 unchanged  Intake/Output from previous day: 06/14 0701 - 06/15 0700 In: 998.1 [I.V.:623.1; IV  Piggyback:375] Out: 5433 [Drains:100]  Recent Labs  10/01/13 0430 10/01/13 1605 10/02/13 0355  NA 135* 132* 134*  K 4.4 4.9 4.6  CL 96 96 95*  CO2 25 26 25   GLUCOSE 219* 415* 271*  BUN 18 21 18   CREATININE 1.84* 2.32* 2.06*  CALCIUM 8.2* 7.9* 8.1*  MG 2.4  --  2.5  PHOS 1.8* 2.4 2.4    Recent Labs  10/01/13 1605 10/02/13 0355  ALBUMIN 1.8* 1.9*    Recent Labs  10/01/13 0430 10/02/13 0355  WBC 8.2 7.9  HGB 8.3* 8.0*  HCT 27.5* 26.7*  MCV 96.5 95.4  PLT 261 282   Results for Hannah [name], Hannah [name] (MRN [id number])  as of 10/02/2013 09:13  Ref. Range 10/01/2013 08:30  Iron Latest Range: 42-135 ug/dL 45  UIBC Latest Range: 125-400 ug/dL 164  TIBC Latest Range: 250-470 ug/dL 209 (L)  Saturation Ratios Latest Range: 20-55 % 22  Ferritin Latest Range: 10-291 ng/mL 399 (H)   Results for Hannah [name], Hannah [name] (MRN [id number]) as of 10/02/2013 09:13  Ref. Range 10/02/2013 03:55  Phosphorus Latest Range: 2.3-4.6 mg/dL 2.4    Scheduled Medications: . aspirin  81 mg Oral Daily  . atorvastatin  80 mg Oral q1800  . calcitRIOL  0.25 mcg Oral Daily  . darbepoetin (ARANESP) injection - NON-DIALYSIS  200 mcg Subcutaneous Q Wed-1800  . feeding supplement (NEPRO CARB STEADY)  237 mL Oral TID WC  . insulin aspart  0-15 Units Subcutaneous TID WC  . insulin aspart  0-5 Units Subcutaneous QHS  . insulin glargine  35 Units Subcutaneous Q24H  . lidocaine (PF)  5 mL Other Once  . midodrine  10 mg Oral TID WC  . pantoprazole  40 mg Oral Daily  . piperacillin-tazobactam (ZOSYN)  IV  3.375 g Intravenous 3 times per day  . polyethylene glycol  17 g Oral Daily  . sodium chloride  10-40 mL Intracatheter Q12H  . vancomycin  1,000 mg Intravenous Q24H  Infusion Medicications . sodium chloride Stopped (10/01/13 2000)  . argatroban 0.4 mcg/kg/min (10/02/13 0800)  . DOBUTamine 5 mcg/kg/min (10/02/13 0800)  . norepinephrine (LEVOPHED) Adult infusion 4 mcg/min (10/02/13 0800)  . dialysis replacement fluid (prismasate) 300 mL/hr at 10/01/13 0749  . dialysis replacement fluid (prismasate) 200 mL/hr at 10/01/13 2122  . dialysate (PRISMASATE) 1,500 mL/hr at 10/02/13 0657    LOS: 29 days   DUNHAM,CYNTHIA B 10/02/2013,9:18 AM

## 2013-10-03 DIAGNOSIS — R259 Unspecified abnormal involuntary movements: Secondary | ICD-10-CM

## 2013-10-03 LAB — CARBOXYHEMOGLOBIN
Carboxyhemoglobin: 2.1 % — ABNORMAL HIGH (ref 0.5–1.5)
METHEMOGLOBIN: 1 % (ref 0.0–1.5)
O2 Saturation: 60.3 %
TOTAL HEMOGLOBIN: 13.5 g/dL (ref 12.0–16.0)

## 2013-10-03 LAB — RENAL FUNCTION PANEL
ALBUMIN: 1.9 g/dL — AB (ref 3.5–5.2)
Albumin: 1.9 g/dL — ABNORMAL LOW (ref 3.5–5.2)
BUN: 18 mg/dL (ref 6–23)
BUN: 22 mg/dL (ref 6–23)
CALCIUM: 8.1 mg/dL — AB (ref 8.4–10.5)
CHLORIDE: 98 meq/L (ref 96–112)
CO2: 26 mEq/L (ref 19–32)
CO2: 24 mEq/L (ref 19–32)
Calcium: 8.2 mg/dL — ABNORMAL LOW (ref 8.4–10.5)
Chloride: 98 mEq/L (ref 96–112)
Creatinine, Ser: 1.92 mg/dL — ABNORMAL HIGH (ref 0.50–1.10)
Creatinine, Ser: 2.2 mg/dL — ABNORMAL HIGH (ref 0.50–1.10)
GFR calc Af Amer: 35 mL/min — ABNORMAL LOW (ref >90)
GFR calc non Af Amer: 30 mL/min — ABNORMAL LOW (ref >90)
GFR, EST AFRICAN AMERICAN: 30 mL/min — AB (ref >90)
GFR, EST NON AFRICAN AMERICAN: 26 mL/min — AB (ref >90)
GLUCOSE: 278 mg/dL — AB (ref 70–99)
GLUCOSE: 352 mg/dL — AB (ref 70–99)
PHOSPHORUS: 2.3 mg/dL (ref 2.3–4.6)
POTASSIUM: 4.6 meq/L (ref 3.7–5.3)
POTASSIUM: 5.2 meq/L (ref 3.7–5.3)
Phosphorus: 2 mg/dL — ABNORMAL LOW (ref 2.3–4.6)
SODIUM: 135 meq/L — AB (ref 137–147)
Sodium: 135 mEq/L — ABNORMAL LOW (ref 137–147)

## 2013-10-03 LAB — CBC
HCT: 25.6 % — ABNORMAL LOW (ref 36.0–46.0)
Hemoglobin: 7.8 g/dL — ABNORMAL LOW (ref 12.0–15.0)
MCH: 29.8 pg (ref 26.0–34.0)
MCHC: 30.5 g/dL (ref 30.0–36.0)
MCV: 97.7 fL (ref 78.0–100.0)
PLATELETS: 254 10*3/uL (ref 150–400)
RBC: 2.62 MIL/uL — AB (ref 3.87–5.11)
RDW: 18 % — ABNORMAL HIGH (ref 11.5–15.5)
WBC: 6.4 10*3/uL (ref 4.0–10.5)

## 2013-10-03 LAB — GLUCOSE, CAPILLARY
GLUCOSE-CAPILLARY: 262 mg/dL — AB (ref 70–99)
Glucose-Capillary: 289 mg/dL — ABNORMAL HIGH (ref 70–99)
Glucose-Capillary: 344 mg/dL — ABNORMAL HIGH (ref 70–99)

## 2013-10-03 LAB — MAGNESIUM: MAGNESIUM: 2.6 mg/dL — AB (ref 1.5–2.5)

## 2013-10-03 LAB — APTT: aPTT: 51 seconds — ABNORMAL HIGH (ref 24–37)

## 2013-10-03 MED ORDER — PIPERACILLIN-TAZOBACTAM IN DEX 2-0.25 GM/50ML IV SOLN
2.2500 g | Freq: Three times a day (TID) | INTRAVENOUS | Status: DC
Start: 2013-10-03 — End: 2013-10-05
  Administered 2013-10-03 – 2013-10-05 (×6): 2.25 g via INTRAVENOUS
  Filled 2013-10-03 (×8): qty 50

## 2013-10-03 NOTE — Progress Notes (Signed)
Pt up to bedside commode and had a large BM. Post BM pt wanted to get to chair. Pt stood and was cleaned then attempted to walk to chair when legs "jerked and gave out". Pt was assisted to the floor with NT. No harm to pt. Lift was used to get pt back to bed because pt stated she felt "too weak". Pt current VS WNL. BP 94/62 (70), HR 118, O2 sats 100 on monitor. Pt returned to bed safely with no complaints of pain. Pending EEG per neurology

## 2013-10-03 NOTE — Progress Notes (Signed)
NEURO HOSPITALIST PROGRESS NOTE   SUBJECTIVE:                                                                                                                        Getting dialysis at this moment. Stated that " twitching movements of arms and legs started just the past week". No other neurological complains. EEG pending.  OBJECTIVE:                                                                                                                           Vital signs in last 24 hours: Temp:  [97.4 F (36.3 C)-98.3 F (36.8 C)] 98.3 F (36.8 C) (06/17 0821) Pulse Rate:  [103-125] 115 (06/17 1015) Resp:  [10-27] 24 (06/17 1015) BP: (73-142)/(25-90) 95/61 mmHg (06/17 1015) SpO2:  [91 %-100 %] 100 % (06/17 1015) Weight:  [113.6 kg (250 lb 7.1 oz)] 113.6 kg (250 lb 7.1 oz) (06/17 0500)  Intake/Output from previous day: 06/16 0701 - 06/17 0700 In: 1716.8 [P.O.:914; I.V.:440.3; IV Piggyback:362.5] Out: 2065  Intake/Output this shift: Total I/O In: 193.4 [P.O.:100; I.V.:68.4; IV Piggyback:25] Out: 16 [Other:55] Nutritional status: Diabetic  Past Medical History  Diagnosis Date  . High cholesterol   . Diabetic retinopathy   . Peripheral neuropathy     "tips of toes"  . Blind right eye   . CHF (congestive heart failure)   . CAD (coronary artery disease)   . GERD (gastroesophageal reflux disease)   . Hypertension   . History of lung cancer 07/2011    s/p left lower lobectomy  . Diabetes mellitus     IDDM  . Cataract of right eye   . Ulcer of toe of right foot 07/10/2012    great toe  . Breast calcification, left 06/2012  . Acute biphenotypic leukemia   . CKD (chronic kidney disease) stage 5, GFR less than 15 ml/min   . Nephrotic syndrome   . Gastritis     H/o gastritis on prior endoscopy  . Anemia   . Carotid artery disease    Neurologic Exam:  Mental Status:  Alert, oriented, thought content appropriate. Speech fluent without  evidence of aphasia. Able to follow 3 step commands without difficulty.  Cranial Nerves:  II: Discs flat bilaterally; blind in right eye but able to see on all fields when looking with left eye, left pupil, round, reactive to light right pupil is noncreative.  III,IV, VI: ptosis not present, extra-ocular motions intact bilaterally with the right eye deviated laterally at rest and more pronounced deviation laterally with upward gaze.  V,VII: smile symmetric, facial light touch sensation normal bilaterally  VIII: hearing normal bilaterally  IX,X: gag reflex present  XI: bilateral shoulder shrug  XII: midline tongue extension without atrophy or fasciculations  Motor:  Moves all limbs spontaneously and symmetrically. Spontaneous myoclonic movements noted upper extremities.  Sensory: Pinprick and light touch intact throughout, bilaterally with stocking distribution neuropathy  Deep Tendon Reflexes:  Right: Upper Extremity Left: Upper extremity  biceps (C-5 to C-6) 2/4 biceps (C-5 to C-6) 2/4  tricep (C7) 2/4 triceps (C7) 2/4  Brachioradialis (C6) 2/4 Brachioradialis (C6) 2/4  Lower Extremity Lower Extremity  quadriceps (L-2 to L-4) 0/4 quadriceps (L-2 to L-4) 0/4  Achilles (S1) 0/4 Achilles (S1) 0/4  Plantars:  Right: downgoing Left: downgoing  Cerebellar:  normal finger-to-nose, normal heel-to-shin test  Gait: not tested   Lab Results: Lab Results  Component Value Date/Time   CHOL 308* 11/22/2012  1:05 PM   Lipid Panel No results found for this basename: CHOL, TRIG, HDL, CHOLHDL, VLDL, LDLCALC,  in the last 72 hours  Studies/Results: No results found.  MEDICATIONS                                                                                                                        I have reviewed the patient's current medications. Scheduled: . aspirin  81 mg Oral Daily  . atorvastatin  80 mg Oral q1800  . calcitRIOL  0.25 mcg Oral Daily  . darbepoetin (ARANESP) injection -  NON-DIALYSIS  200 mcg Subcutaneous Q Wed-1800  . feeding supplement (NEPRO CARB STEADY)  237 mL Oral TID WC  . insulin aspart  0-15 Units Subcutaneous TID WC  . insulin aspart  0-5 Units Subcutaneous QHS  . insulin glargine  40 Units Subcutaneous Q24H  . lidocaine (PF)  5 mL Other Once  . midodrine  10 mg Oral TID WC  . pantoprazole  40 mg Oral Daily  . piperacillin-tazobactam (ZOSYN)  IV  2.25 g Intravenous 3 times per day  . polyethylene glycol  17 g Oral Daily  . sodium chloride  10-40 mL Intracatheter Q12H  . 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 Awaiting EEG. Will follow up.  Dorian Pod, MD [facility] [phone]  10/03/2013, 10:39 AM

## 2013-10-03 NOTE — Progress Notes (Signed)
[facility]  Subjective  - Doing ok this am.  No new complaints   Objective 90/55 117 97.7 F (36.5 C) (Oral) 16 100%  Intake/Output Summary (Last 24 hours) at 10/03/13 0726 Last data filed at 10/03/13 0700  Gross per 24 hour  Intake 1716.8 ml  Output   2065 ml  Net -348.2 ml    Right LE incisions C/D and healing well Right groin wound vac in place.  Skin edges soft without erythema. Doppler DP bilaterally   Assessment/Planning: POD #5 right groin debridement 09/19/2013 Closure of right leg fasciotomies wounds well healed we may remove the staples later today. Ischemic R foot s/p R femoral thrombectomy and 4-compartment fasciotomy  Cardiogenic shock Carotid stenosis R 60-79% ICA stenosis, L 80-99% ICA stenosis Cont. Argatroban and IV antibiotics. Wound vac change today        Laurence Slate [facility] 10/03/2013 7:26 AM --  Laboratory Lab Results:  Recent Labs  10/02/13 0355 10/03/13 0400  WBC 7.9 6.4  HGB 8.0* 7.8*  HCT 26.7* 25.6*  PLT 282 254   BMET  Recent Labs  10/02/13 1600 10/03/13 0400  NA 135* 135*  K 4.9 4.6  CL 98 98  CO2 26 26  GLUCOSE 332* 278*  BUN 20 18  CREATININE 2.04* 1.92*  CALCIUM 7.8* 8.2*    COAG Lab Results  Component Value Date   INR 2.17* 09/29/2013   INR 2.59* 09/28/2013   INR 2.58* 09/27/2013   No results found for this basename: PTT

## 2013-10-03 NOTE — Progress Notes (Signed)
ANTICOAGULATION CONSULT NOTE   Pharmacy Consult:  Argatroban Indication:  Thromboembolic state  Allergies  Allergen Reactions  . Crestor [Rosuvastatin] Other (See Comments)    Severe muscle weakness  . Nsaids Other (See Comments)    Not allergic, "bad on my kidneys"  . Ciprofloxacin Rash    Patient Measurements: Height: 5' 7.5" (171.5 cm) Weight: 250 lb 7.1 oz (113.6 kg) IBW/kg (Calculated) : 62.75  Vital Signs: Temp: 98.2 F (36.8 C) (06/17 1204) Temp src: Oral (06/17 1204) BP: 86/52 mmHg (06/17 1200) Pulse Rate: 114 (06/17 1200)  Labs:  Recent Labs  10/01/13 0430 10/01/13 1205  10/02/13 0355 10/02/13 1600 10/03/13 0400  HGB 8.3*  --   --  8.0*  --  7.8*  HCT 27.5*  --   --  26.7*  --  25.6*  PLT 261  --   --  282  --  254  APTT 54* 56*  --  52*  --  51*  CREATININE 1.84*  --   < > 2.06* 2.04* 1.92*  < > = values in this interval not displayed.  Estimated Creatinine Clearance: 48.5 ml/min (by C-G formula based on Cr of 1.92).  Assessment: 47 YOF with right foot ischemia s/p femoral endarterectomy and thrombectomy on 09/17/13.  S/p fasciotomy closure on 09/19/13. HIT panel negative. Now on CVVHD.  Argatroban therapeutic aPTT 51sec on 0.59mcg/kg/min, CBC stable, no bleeding noted.      I&D for wound infection 6/12 now with wound vac, change scheduled for today- hold warfarin until vascular ok with starting.  Goal of Therapy:  aPTT 50 - 90 seconds Monitor platelets by anticoagulation protocol: Yes   Plan 1. Continue Argatroban at 0.4 mcg/kg/min 2. Daily aPTT  Erin Hearing PharmD., BCPS Clinical Pharmacist Pager [phone] 10/03/2013 1:22 PM

## 2013-10-03 NOTE — Progress Notes (Signed)
ANTIBIOTIC CONSULT NOTE - FOLLOW UP  Pharmacy Consult for Vancomycin, Zosyn Indication: groin wound infection   Allergies  Allergen Reactions  . Crestor [Rosuvastatin] Other (See Comments)    Severe muscle weakness  . Nsaids Other (See Comments)    Not allergic, "bad on my kidneys"  . Ciprofloxacin Rash    Patient Measurements: Height: 5' 7.5" (171.5 cm) Weight: 250 lb 7.1 oz (113.6 kg) IBW/kg (Calculated) : 62.75  Vital Signs: Temp: 98.3 F (36.8 C) (06/17 0821) Temp src: Oral (06/17 0821) BP: 110/55 mmHg (06/17 0900) Pulse Rate: 119 (06/17 0900) Intake/Output from previous day: 06/16 0701 - 06/17 0700 In: 1716.8 [P.O.:914; I.V.:440.3; IV Piggyback:362.5] Out: 2065  Intake/Output from this shift: Total I/O In: 33 [I.V.:20.5; IV Piggyback:12.5] Out: 37 [Other:37]  Labs:  Recent Labs  10/01/13 0430  10/02/13 0355 10/02/13 1600 10/03/13 0400  WBC 8.2  --  7.9  --  6.4  HGB 8.3*  --  8.0*  --  7.8*  PLT 261  --  282  --  254  CREATININE 1.84*  < > 2.06* 2.04* 1.92*  < > = values in this interval not displayed. Estimated Creatinine Clearance: 48.5 ml/min (by C-G formula based on Cr of 1.92). No results found for this basename: VANCOTROUGH, Corlis Leak, VANCORANDOM, GENTTROUGH, GENTPEAK, GENTRANDOM, TOBRATROUGH, TOBRAPEAK, TOBRARND, AMIKACINPEAK, AMIKACINTROU, AMIKACIN,  in the last 72 hours   Microbiology: Recent Results (from the past 720 hour(s))  SURGICAL PCR SCREEN     Status: None   Collection Time    09/05/13  6:21 PM      Result Value Ref Range Status   MRSA, PCR NEGATIVE  NEGATIVE Final   Staphylococcus aureus NEGATIVE  NEGATIVE Final   Comment:            The Xpert SA Assay (FDA     approved for NASAL specimens     in patients over 45 years of age),     is one component of     a comprehensive surveillance     program.  Test performance has     been validated by [employer] for patients greater     than or equal to 45 year old.     It is not  intended     to diagnose infection nor to     guide or monitor treatment.  CULTURE, BLOOD (ROUTINE X 2)     Status: None   Collection Time    09/19/13  9:25 PM      Result Value Ref Range Status   Specimen Description BLOOD HEMODIALYSIS CATHETER   Final   Special Requests BOTTLES DRAWN AEROBIC ONLY 10CC   Final   Culture  Setup Time     Final   Value: 09/20/2013 00:44     Performed at [facility]   Culture     Final   Value: NO GROWTH 5 DAYS     Performed at [facility]   Report Status 09/26/2013 FINAL   Final  CULTURE, BLOOD (ROUTINE X 2)     Status: None   Collection Time    09/19/13  9:35 PM      Result Value Ref Range Status   Specimen Description BLOOD LEFT HAND   Final   Special Requests BOTTLES DRAWN AEROBIC ONLY 5CC   Final   Culture  Setup Time     Final   Value: 09/20/2013 00:43     Performed at [facility]   Culture     Final   Value: NO GROWTH 5 DAYS     Performed at [facility]   Report Status 09/26/2013 FINAL   Final  WOUND CULTURE     Status: None   Collection Time    09/26/13 11:17 AM      Result Value Ref Range Status   Specimen Description WOUND RIGHT LEG   Final   Special Requests NONE   Final   Gram Stain     Final   Value: MODERATE WBC PRESENT,BOTH PMN AND MONONUCLEAR     NO SQUAMOUS EPITHELIAL CELLS SEEN     MODERATE GRAM NEGATIVE RODS     FEW GRAM POSITIVE COCCI     IN PAIRS FEW GRAM POSITIVE RODS   Culture     Final   Value: MULTIPLE ORGANISMS PRESENT, NONE PREDOMINANT     Note: NO STAPHYLOCOCCUS AUREUS ISOLATED NO GROUP A STREP (S.PYOGENES) ISOLATED     Performed at [facility]   Report Status 09/28/2013 FINAL   Final    Anti-infectives   Start     Dose/Rate Route Frequency Ordered Stop   10/03/13 1400  piperacillin-tazobactam (ZOSYN) IVPB 2.25 g     2.25 g 100 mL/hr over 30 Minutes Intravenous 3 times per day 10/03/13 0930     09/30/13 0600  piperacillin-tazobactam (ZOSYN) IVPB 3.375 g  Status:   Discontinued     3.375 g 12.5 mL/hr over 240 Minutes Intravenous 3 times per day 09/29/13 2246 10/03/13 0930   09/29/13 1400  vancomycin (VANCOCIN) IVPB 1000 mg/200 mL premix     1,000 mg 200 mL/hr over 60 Minutes Intravenous Every 24 hours 09/29/13 1348     09/29/13 0600  piperacillin-tazobactam (ZOSYN) IVPB 3.375 g  Status:  Discontinued     3.375 g 100 mL/hr over 30 Minutes Intravenous 3 times per day 09/28/13 1940 09/29/13 2246   09/27/13 1200  vancomycin (VANCOCIN) 1,250 mg in sodium chloride 0.9 % 250 mL IVPB  Status:  Discontinued     1,250 mg 166.7 mL/hr over 90 Minutes Intravenous Every 24 hours 09/26/13 1033 09/29/13 1348   09/26/13 1200  piperacillin-tazobactam (ZOSYN) IVPB 3.375 g  Status:  Discontinued     3.375 g 100 mL/hr over 30 Minutes Intravenous 4 times per day 09/26/13 1033 09/28/13 1940   09/26/13 1200  vancomycin (VANCOCIN) 2,000 mg in sodium chloride 0.9 % 500 mL IVPB     2,000 mg 250 mL/hr over 120 Minutes Intravenous  Once 09/26/13 1033 09/26/13 1319   09/18/13 1200  cefUROXime (ZINACEF) 1.5 g in dextrose 5 % 50 mL IVPB     1.5 g 100 mL/hr over 30 Minutes Intravenous Every 24 hours 09/17/13 1840 09/18/13 1200   09/18/13 0100  cefUROXime (ZINACEF) 1.5 g in dextrose 5 % 50 mL IVPB  Status:  Discontinued     1.5 g 100 mL/hr over 30 Minutes Intravenous Every 12 hours 09/17/13 1749 09/17/13 1840   09/17/13 1330  cefUROXime (ZINACEF) 1.5 g in dextrose 5 % 50 mL IVPB     1.5 g 100 mL/hr over 30 Minutes Intravenous To Surgery 09/17/13 1317 09/17/13 1348   09/12/13 0000  ceFAZolin (ANCEF) IVPB 1 g/50 mL premix    Comments:  Send with pt to OR   1 g 100 mL/hr over 30 Minutes Intravenous On call 09/11/13 1055 09/12/13 0100      Assessment: 43 yo morbidly obese female recently discharged for HF symptom  and had stress test that showed ischemia in anterior and apical region. Underwent cath 5/19, showed occluded RCA and triple vessel dx. Placement of swan and IABP for  shock, developed cold foot and IABP was removed. Findings: thrombosis of her R femoral artery. F Femoral endarterectomy, R. Femoral thrombectomy and R. Leg fasciotomies 6/01.   Patient has been dialysis dependent since 6/21. 5/21 CRRT --> IHD 5/26 --> back to CRRT 6/4 Patient now continues on CVVHD with good volume status, but unfortunately has been difficult in attempts to wean levophed. Patient also on broad spectrum antibiotics for groin wound infection.   Over the last week, patient has been experiencing some twitching which has effected her speech, better today but still present. Zosyn does have some case reports of seizure, though low incidence. Other alternative broad spectrum antibiotic options (Cefepime, Fortaz, Unasyn, Meropenem) have been assessed but also with reported risk.   6/10 Zosyn 3.375g Q6h started with vancomycin 6/13 Zosyn 3.375g Q6h changed to q8h to address twitching  I think the best option at this point would be to reduce Zosyn dose to 2.25 g IV q8h, which is still within appropriate limits given today is Day #7 of abx, with vascular reporting the skin edges to be soft without erythema. Would Vac to be changed today.   Plan:  Continue Vancomycin 1000mg  IV Q24h Change Zosyn to 2.25 g IV q8h Follow twitching status, infection status, cultures, duration of therapy  Mayda Shippee B. Leitha Schuller, PharmD Clinical Pharmacist - Resident Phone: [phone] Pager: [phone] 10/03/2013 10:07 AM

## 2013-10-03 NOTE — Progress Notes (Signed)
ADVANCED HEART FAILURE ROUNDING NOTE   SUBJECTIVE  43 yo female with PMH of DM1, stage V kidney disease, blindness, h/o lung CA s/p resection who was recently discharged for heart failure symptom had a high risk stress test which showed ischemia in anterior and apical region. She underwent cath 09/04/2013 which showed chronically occluded RCA and triple vessel dx. CT surgery consulted but not felt to be surgical candidate due to poor PFTs, suboptimal targets, comorbidities and shock. EF 22% by Myoview. 30-35% by echo  Underwent placement of swan and IABP for shock (co-ox 38%). Unfortunately developed cold foot and IABP had to be removed.Underwent R femoral thrombectomy and 4 compartment fasciotomy of RLE on 6/1.On 6/3 had VT/VF->PEA arrest 10 mins CPR. U/s shows R axillary DVT  Continues on CVVHD. Weight down another 2 pounds. CVP 4  Levophed now up to 5. Also on dobutamine 6. On midodrine 10 tid. Underwent R groin wound vac change today. Continues with twitching      CURRENT MEDS . aspirin  81 mg Oral Daily  . atorvastatin  80 mg Oral q1800  . calcitRIOL  0.25 mcg Oral Daily  . darbepoetin (ARANESP) injection - NON-DIALYSIS  200 mcg Subcutaneous Q Wed-1800  . feeding supplement (NEPRO CARB STEADY)  237 mL Oral TID WC  . insulin aspart  0-15 Units Subcutaneous TID WC  . insulin aspart  0-5 Units Subcutaneous QHS  . insulin glargine  40 Units Subcutaneous Q24H  . lidocaine (PF)  5 mL Other Once  . midodrine  10 mg Oral TID WC  . pantoprazole  40 mg Oral Daily  . piperacillin-tazobactam (ZOSYN)  IV  2.25 g Intravenous 3 times per day  . polyethylene glycol  17 g Oral Daily  . sodium chloride  10-40 mL Intracatheter Q12H  . vancomycin  1,000 mg Intravenous Q24H    OBJECTIVE  Filed Vitals:   10/03/13 1615 10/03/13 1630 10/03/13 1645 10/03/13 1700  BP: 105/69 100/47 102/62 97/61  Pulse:  115 114 116  Temp:      TempSrc:      Resp:  11 12 12   Height:      Weight:      SpO2:   100% 98% 100%    Intake/Output Summary (Last 24 hours) at 10/03/13 1705 Last data filed at 10/03/13 1600  Gross per 24 hour  Intake 1523.78 ml  Output   1135 ml  Net 388.78 ml   Filed Weights   10/01/13 0648 10/02/13 0500 10/03/13 0500  Weight: 252 lb 10.4 oz (114.6 kg) 252 lb 6.8 oz (114.5 kg) 250 lb 7.1 oz (113.6 kg)    PHYSICAL EXAM General:Lying in bed.  NAD Neuro: Alert and oriented X 3. Moves all extremities spontaneous HEENT:  Normal  Neck: Supple without bruits. Chest L subclav PC. R subclav TLC. Unable to see JVD Lungs:  clear Heart: tachy regular. +s3 Abdomen: Soft, non-tender, non-distended, BS + x 4.  Extremities: tr edema. R foot. Warm viable. Weak pulses Wound vac in R groin  CBC  Recent Labs  10/02/13 0355 10/03/13 0400  WBC 7.9 6.4  HGB 8.0* 7.8*  HCT 26.7* 25.6*  MCV 95.4 97.7  PLT 282 678   Basic Metabolic Panel  Recent Labs  10/02/13 0355  10/03/13 0400 10/03/13 1600  NA 134*  < > 135* 135*  K 4.6  < > 4.6 5.2  CL 95*  < > 98 98  CO2 25  < > 26 24  GLUCOSE 271*  < >  278* 352*  BUN 18  < > 18 22  CREATININE 2.06*  < > 1.92* 2.20*  CALCIUM 8.1*  < > 8.2* 8.1*  MG 2.5  --  2.6*  --   PHOS 2.4  < > 2.0* 2.3  < > = values in this interval not displayed. Liver Function Tests  Recent Labs  10/02/13 1208  10/03/13 0400 10/03/13 1600  AST 15  --   --   --   ALT 16  --   --   --   ALKPHOS 133*  --   --   --   BILITOT 0.6  --   --   --   PROT 6.2  --   --   --   ALBUMIN 2.0*  < > 1.9* 1.9*  < > = values in this interval not displayed. Cardiac Enzymes No results found for this basename: CKTOTAL, CKMB, CKMBINDEX, TROPONINI,  in the last 72 hours Thyroid Function Tests  Recent Labs  10/02/13 1208  TSH 2.350    TELE  Sinus tachycardia 100-110   Radiology/Studies  Dg Chest 2 View  09/06/2013   CLINICAL DATA:  CHF, intermittent dyspnea  EXAM: CHEST  2 VIEW  COMPARISON:  Prior chest x-ray 08/13/2013; prior chest CT 08/22/2013   FINDINGS: Stable cardiac and mediastinal contours. Atherosclerotic calcifications are present within the transverse aorta. Moderate left and small right pleural effusions are similar compared to prior. There is persistent associated bibasilar atelectasis. Mild pulmonary vascular congestion without overt edema. No pneumothorax. No new focal airspace consolidation. No acute osseous abnormality.  IMPRESSION: 1. Stable moderate left and small right pleural effusions and associated bibasilar atelectasis. 2. Pulmonary vascular congestion without overt edema.   Electronically Signed   By: Jacqulynn Cadet M.D.   On: 09/06/2013 07:55   Ct Chest Wo Contrast  08/22/2013   CLINICAL DATA:  Left lung cancer status post lobectomy. Weight gain. Renal insufficiency.  EXAM: CT CHEST WITHOUT CONTRAST  TECHNIQUE: Multidetector CT imaging of the chest was performed following the standard protocol without IV contrast.  COMPARISON:  Radiographs 08/13/2013 and 08/04/2013.  CT 08/02/2012.  FINDINGS: There are stable postsurgical changes related to prior left lower lobe resection. There has been interval enlargement of several mediastinal lymph nodes. These include 12 mm right paratracheal (image 19), 13 mm precarinal (image 23), and 14 mm AP window (image 25) lymph nodes. Some of these nodes have retained fatty hila. Allowing for the limitations of noncontrast technique, the hila appear stable.  There is stable low-density within the right thyroid lobe. Atherosclerosis of the aorta, great vessels and coronary arteries is noted. The heart size is normal. There is no pericardial effusion.  Moderate size dependent pleural effusions are present bilaterally. There is associated dependent airspace disease in both lower lobes. In addition, there are more focal airspace opacities within the right lower lobe and inferior aspect of the left upper lobe. The latter is somewhat nodular, measuring up to 1.8 cm on image 43. No other focal nodularity  or endobronchial lesions are demonstrated.  The visualized upper abdomen has a stable appearance. There is no adrenal mass. There is increased subcutaneous edema throughout the subcutaneous fat, especially within the anterior aspect of the upper abdomen.  No worrisome osseous findings are demonstrated.  IMPRESSION: 1. As demonstrated radiographically, there are bilateral pleural effusions and bilateral airspace opacities which are new compared with the prior CT. Although there are focal somewhat nodular components in the right lower and left upper  lobes, these findings are most likely infectious/inflammatory. 2. Progressive mediastinal lymphadenopathy. Some of the nodes have retained fatty hila and may be reactive. Metastatic disease cannot be completely excluded. 3. Stable atherosclerosis and low-density within the right thyroid lobe. 4. If the airspace opacities and pleural effusions fail to respond to appropriate clinical therapy, follow-up CT or PET-CT may be warranted to exclude metastatic disease.   Electronically Signed   By: Camie Patience M.D.   On: 08/22/2013 10:26   Dg Chest Portable 1 View  08/13/2013   CLINICAL DATA:  Shortness of breath increasing for 2 weeks. History of CHF.  EXAM: PORTABLE CHEST - 1 VIEW  COMPARISON:  08/04/2013  FINDINGS: Again noted are bibasilar lung densities that are suggestive for pleural effusions and atelectasis. Heart size appears to be enlarged. There is some peribronchial thickening and cannot exclude pulmonary edema.  IMPRESSION: Bilateral pleural effusions with basilar atelectasis. There is mild pulmonary edema. Minimal change from the previous examination.   Electronically Signed   By: Markus Daft M.D.   On: 08/13/2013 23:10    ASSESSMENT AND PLAN 1. Cardiogenic shock 2. Acute on chronic systolic HF     EF ~85-88% 3. 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 Acute respiratory failure 5. 3-V CAD 6. Carotid stenosis     - doppler 08/31/2013 shows R 60-79% ICA stenosis, L 80-99% ICA  stenosis     - vascular surgery aware 7. 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 DM1 9. Ischemic R foot s/p R femoral thrombectomy and 4-compartment fasciotomy.  10. VT/VF arrest 6/3 11. R axillary DVT 12. Severe protein calorie malnutrition  13. Large right groin wound s/p debridement 6/12  Volume status looks great. Down 38 pounds. Unfortunately unable to wean levophed completely. I think we need to let her gain a bit of volume back - will d/w Renal. I am still not convinced we will be able to liberate her from inotropes to permit HD. Is PD an option?   Wound management per [facility] and Wound Care. Continue abx for now. I am hopeful we can start coumadin soon.   CBGs high.Lantus increased yesterday,   Continues with severe twitching. Neuro following.   Daniel Bensimhon,MD 5:09 PM

## 2013-10-03 NOTE — Progress Notes (Signed)
Assessment:  1. Dialysis dependent anuric AoCKD5, now ESRD, cardiogenic shock, cont CRRT-- weight down 131->113.6 with CRRT; unable to pull fluid or wean pressors further over past 48 hours; trying to keep volume even at this time. As previously stated will have to be able to maintain BP off systemic pressors and inotropes in order to transition to intermittent HD. Like Dr. Jeffie Pollock, I am not sure this will be possible... Dialysis dependent since 5/21; current access is left TDC placed 5/27' 5/21 CRRT->IHD (5/26) ->back to CRRT 6/4  2. Hypophosphatemia - s/p 10 mmoles IV 6/15; phos 2.0 today 3. 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 S/p R femoral endarterectomy wound complications, thrombectomy , and fasciotomy - wound VAC in place; notes reviewed. On vanco and zosyn with VAC change planned for today 6. CAD, ischemic CM 7. Anemia - on darbe 200/week; not at goal; iron studies appear "adequate"  8. Twitching - etiology of this not obvious to me... I would agree with notion of stopping zosyn to see if this is cause for her myoclonus - if alternateive ATB possible; pharmacy has recommended at least a dosage reduction in zosyn to 2.25 grams Q8H pending any final decision to change to alt ATB 9. Lung CA with prior LLLobectomy 10. Right axillary DVT 11. Bilateral carotid stenosis 12. Diabetes - insulin  Subjective: Interval History: Complains of twitching as she has for several days - myoclonus becoming more pronounced Affecting her speech some as well  Eating breakfast Only pain is in groin BP dropped lower last PM and NE was increased Trying to keep volume even at this time  Objective: Vital signs in last 24 hours: Temp:  [97.4 F (36.3 C)-98.3 F (36.8 C)] 98.3 F (36.8 C) (06/17 0821) Pulse Rate:  [103-125] 117  (06/17 0815) Resp:  [10-27] 19 (06/17 0815) BP: (73-142)/(25-90) 110/90 mmHg (06/17 0815) SpO2:  [91 %-100 %] 99 % (06/17 0815) Weight:  [113.6 kg (250 lb 7.1 oz)] 113.6 kg (250 lb 7.1 oz) (06/17 0500) Weight change: -0.9 kg (-1 lb 15.8 oz)  Current CRRT Prescription:  Start Date: restarted 09/20/13  Catheter: TDC left BFR: 200  Pre Blood Pump: 300 DFR: 1500  Replacement Rate: 200  Goal UF keeping volume even (since 6/16)  Anticoagulation: argatroban systemic, clotted on heparin, +HIT  Clotting: not excessive  WEIGHT TRENDING 10/03/13 0500 113.6 kg  10/02/13 0500 114.5 kg  10/01/13 0648 114.6 kg (252 lb 10.4 oz)   09/30/13 0500 117.6 kg (259 lb 4.2 oz) 09/29/13 0600 121.7 kg (268 lb 4.8 oz) 09/28/13 0600 123.8 kg (272 lb 14.9 oz) 09/27/13 0500 127.2 kg (280 lb 6.8 oz) 09/26/13 0500 130.9 kg (288 lb 9.3 oz) 09/25/13 0500 133.5 kg (294 lb 5 oz)  09/24/13 0600 134.5 kg (296 lb 8.3 oz) 09/23/13 0700 132.8 kg (292 lb 12.3 oz) 09/22/13 0500 131.1 kg (289 lb 0.4 oz) 09/21/13 0500 129.6 kg (285 lb 11.5 oz  09/20/13 0500 131 kg (288 lb 12.8 oz)  CRRT restarted  PHYSICAL EXAMINATION General appearance: alert and cooperative, sitting up in bed, NAD BP 110/90  Pulse 117  Temp(Src) 98.3 F (36.8 C) (Oral)  Resp 19  Ht 5' 7.5" (1.715 m)  Wt 113.6 kg (250 lb 7.1 oz)  BMI 38.62 kg/m2  SpO2 99%  LMP 08/19/2013 Mouth twitching  and tremor hands with intention is unchanged; myoclonic jerks more pronounced  Alert,oriented, pleasant  Central line R and TDC L Resp: clear to auscultation bilaterally anteriorly  TDC dressing dry and intact Cardio: regular rate and rhythm, S1, S2 normal, no murmur, click, rub or gallop GI: soft, non-tender; bowel sounds normal; no masses,  no organomegaly Extremities: edema less edematous, RLE post op;  Wound VAC in place; stapled incisions clean and dry on LE's Dusky tips of toes right root #3-5 unchanged  Intake/Output from previous day: 06/14 0701 -  06/15 0700 In: 998.1 [I.V.:623.1; IV Piggyback:375] Out: 5433 [Drains:100]  Recent Labs  10/02/13 0355 10/02/13 1600 10/03/13 0400  NA 134* 135* 135*  K 4.6 4.9 4.6  CL 95* 98 98  CO2 25 26 26   GLUCOSE 271* 332* 278*  BUN 18 20 18   CREATININE 2.06* 2.04* 1.92*  CALCIUM 8.1* 7.8* 8.2*  MG 2.5  --  2.6*  PHOS 2.4 1.9* 2.0*    Recent Labs  10/02/13 1208 10/02/13 1600 10/03/13 0400  AST 15  --   --   ALT 16  --   --   ALKPHOS 133*  --   --   BILITOT 0.6  --   --   PROT 6.2  --   --   ALBUMIN 2.0* 1.8* 1.9*    Recent Labs  10/02/13 0355 10/03/13 0400  WBC 7.9 6.4  HGB 8.0* 7.8*  HCT 26.7* 25.6*  MCV 95.4 97.7  PLT 282 254   Results for Hannah [name], Hannah [name] (MRN [id number]) as of 10/02/2013 09:13  Ref. Range 10/01/2013 08:30  Iron Latest Range: 42-135 ug/dL 45  UIBC Latest Range: 125-400 ug/dL 164  TIBC Latest Range: 250-470 ug/dL 209 (L)  Saturation Ratios Latest Range: 20-55 % 22  Ferritin Latest Range: 10-291 ng/mL 399 (H)   Results for Hannah [name], Hannah [name] (MRN [id number]) as of 10/02/2013 09:13  Ref. Range 10/02/2013 03:55  Phosphorus Latest Range: 2.3-4.6 mg/dL 2.4    Scheduled Medications: . aspirin  81 mg Oral Daily  . atorvastatin  80 mg Oral q1800  . calcitRIOL  0.25 mcg Oral Daily  . darbepoetin (ARANESP) injection - NON-DIALYSIS  200 mcg Subcutaneous Q Wed-1800  . feeding supplement (NEPRO CARB STEADY)  237 mL Oral TID WC  . insulin aspart  0-15 Units Subcutaneous TID WC  . insulin aspart  0-5 Units Subcutaneous QHS  . insulin glargine  40 Units Subcutaneous Q24H  . lidocaine (PF)  5 mL Other Once  . midodrine  10 mg Oral TID WC  . pantoprazole  40 mg Oral Daily  . piperacillin-tazobactam (ZOSYN)  IV  3.375 g Intravenous 3 times per day  . polyethylene glycol  17 g Oral Daily  . sodium chloride  10-40 mL Intracatheter Q12H  . vancomycin  1,000 mg Intravenous Q24H  Infusion Medicications . sodium chloride Stopped (10/01/13 2000)  . argatroban 0.4  mcg/kg/min (10/03/13 0800)  . DOBUTamine 6.001 mcg/kg/min (10/03/13 0800)  . norepinephrine (LEVOPHED) Adult infusion 4 mcg/min (10/03/13 0832)  . dialysis replacement fluid (prismasate) 300 mL/hr at 10/02/13 1950  . dialysis replacement fluid (prismasate) 200 mL/hr at 10/02/13 2349  . dialysate (PRISMASATE) 1,500 mL/hr at 10/03/13 0723    LOS: 30 days   DUNHAM,CYNTHIA B 10/03/2013,9:12 AM

## 2013-10-04 ENCOUNTER — Other Ambulatory Visit (HOSPITAL_COMMUNITY): Payer: Medicare Other

## 2013-10-04 LAB — RENAL FUNCTION PANEL
ALBUMIN: 1.9 g/dL — AB (ref 3.5–5.2)
Albumin: 1.9 g/dL — ABNORMAL LOW (ref 3.5–5.2)
BUN: 21 mg/dL (ref 6–23)
BUN: 22 mg/dL (ref 6–23)
CALCIUM: 7.9 mg/dL — AB (ref 8.4–10.5)
CO2: 26 mEq/L (ref 19–32)
CO2: 26 meq/L (ref 19–32)
CREATININE: 2.35 mg/dL — AB (ref 0.50–1.10)
CREATININE: 2.11 mg/dL — AB (ref 0.50–1.10)
Calcium: 8.5 mg/dL (ref 8.4–10.5)
Chloride: 97 mEq/L (ref 96–112)
Chloride: 98 mEq/L (ref 96–112)
GFR calc Af Amer: 28 mL/min — ABNORMAL LOW (ref >90)
GFR calc Af Amer: 31 mL/min — ABNORMAL LOW (ref >90)
GFR calc non Af Amer: 24 mL/min — ABNORMAL LOW (ref >90)
GFR calc non Af Amer: 27 mL/min — ABNORMAL LOW (ref >90)
GLUCOSE: 286 mg/dL — AB (ref 70–99)
Glucose, Bld: 191 mg/dL — ABNORMAL HIGH (ref 70–99)
PHOSPHORUS: 2.3 mg/dL (ref 2.3–4.6)
POTASSIUM: 4.8 meq/L (ref 3.7–5.3)
Phosphorus: 2.8 mg/dL (ref 2.3–4.6)
Potassium: 5.1 mEq/L (ref 3.7–5.3)
Sodium: 135 mEq/L — ABNORMAL LOW (ref 137–147)
Sodium: 134 mEq/L — ABNORMAL LOW (ref 137–147)

## 2013-10-04 LAB — CBC
HEMATOCRIT: 24 % — AB (ref 36.0–46.0)
Hemoglobin: 7.1 g/dL — ABNORMAL LOW (ref 12.0–15.0)
MCH: 28.9 pg (ref 26.0–34.0)
MCHC: 29.6 g/dL — AB (ref 30.0–36.0)
MCV: 97.6 fL (ref 78.0–100.0)
PLATELETS: 274 10*3/uL (ref 150–400)
RBC: 2.46 MIL/uL — ABNORMAL LOW (ref 3.87–5.11)
RDW: 17.9 % — ABNORMAL HIGH (ref 11.5–15.5)
WBC: 6.8 10*3/uL (ref 4.0–10.5)

## 2013-10-04 LAB — GLUCOSE, CAPILLARY
GLUCOSE-CAPILLARY: 294 mg/dL — AB (ref 70–99)
Glucose-Capillary: 304 mg/dL — ABNORMAL HIGH (ref 70–99)
Glucose-Capillary: 211 mg/dL — ABNORMAL HIGH (ref 70–99)
Glucose-Capillary: 247 mg/dL — ABNORMAL HIGH (ref 70–99)
Glucose-Capillary: 271 mg/dL — ABNORMAL HIGH (ref 70–99)

## 2013-10-04 LAB — CARBOXYHEMOGLOBIN
CARBOXYHEMOGLOBIN: 2.3 % — AB (ref 0.5–1.5)
METHEMOGLOBIN: 0.9 % (ref 0.0–1.5)
O2 Saturation: 57.4 %
Total hemoglobin: 7.2 g/dL — ABNORMAL LOW (ref 12.0–16.0)

## 2013-10-04 LAB — APTT
APTT: 56 s — AB (ref 24–37)
aPTT: 47 seconds — ABNORMAL HIGH (ref 24–37)

## 2013-10-04 LAB — MAGNESIUM: Magnesium: 2.6 mg/dL — ABNORMAL HIGH (ref 1.5–2.5)

## 2013-10-04 LAB — VANCOMYCIN, TROUGH: VANCOMYCIN TR: 24.3 ug/mL — AB (ref 10.0–20.0)

## 2013-10-04 LAB — MRSA PCR SCREENING: MRSA by PCR: NEGATIVE

## 2013-10-04 MED ORDER — VANCOMYCIN HCL IN DEXTROSE 750-5 MG/150ML-% IV SOLN
750.0000 mg | INTRAVENOUS | Status: DC
  Administered 2013-10-04 – 2013-10-10 (×7): 750 mg via INTRAVENOUS
  Filled 2013-10-04 (×7): qty 150

## 2013-10-04 MED ORDER — INSULIN ASPART 100 UNIT/ML ~~LOC~~ SOLN
3.0000 [IU] | Freq: Three times a day (TID) | SUBCUTANEOUS | Status: DC
  Administered 2013-10-04 – 2013-10-05 (×5): 3 [IU] via SUBCUTANEOUS

## 2013-10-04 NOTE — Progress Notes (Signed)
Spoke with Heart Failure Pharmacist covering patient in regards to van trough 24.3. Pharmacist stated not to give 1400 dose and that he would place orders for new dosing for vancomycin.  1400 dose held.

## 2013-10-04 NOTE — Progress Notes (Signed)
Pharmacy Consult Note  Pharmacy Consult:  Argatroban  Indication:  Thromboembolic state  Pharmacy Consult : Vancomycin and zosyn Indication: groin wound infection  Patient Measurements: Height: 5' 7.5" (171.5 cm) Weight: 251 lb 1.7 oz (113.9 kg) IBW/kg (Calculated) : 62.75  Vital Signs: Temp: 98.4 F (36.9 C) (06/18 1155) Temp src: Oral (06/18 1155) BP: 117/72 mmHg (06/18 1400) Pulse Rate: 115 (06/18 1400)  Labs:  Recent Labs  10/02/13 0355  10/03/13 0400 10/03/13 1600 10/04/13 0500 10/04/13 1200  HGB 8.0*  --  7.8*  --  7.1*  --   HCT 26.7*  --  25.6*  --  24.0*  --   PLT 282  --  254  --  274  --   APTT 52*  --  51*  --  47* 56*  CREATININE 2.06*  < > 1.92* 2.20* 2.35*  --   < > = values in this interval not displayed.  Estimated Creatinine Clearance: 39.7 ml/min (by C-G formula based on Cr of 2.35).  Assessment: 64 YOF with right foot ischemia s/p femoral endarterectomy and thrombectomy on 09/17/13.  S/p fasciotomy closure on 09/19/13. HIT panel negative. Continues on CVVHD.   Argatroban now therapeutic on 0.78mcg/kg/min (aPTT 56 sec),H/h trending down, plt stable, no bleeding noted.     Groin site infection: no fevers noted, wbc normal. Patient currently on day #8 of vancomycin and #5 of zosyn. Patient continues on CRRT, antibiotics have been dose adjusted accordingly. Vanc trough 24.3 today, draw maybe an hour early but not clinically significant. Will decrease daily dose and plan on rechecking new week.  6/3 bld - ng 6/10 wound (leg) - multiple bacteria (F)  Goal of Therapy:  aPTT 50 - 90 seconds Monitor platelets by anticoagulation protocol: Yes Vancomycin trough 15-20   Plan 1. Continue Argatroban at 0.5 mcg/kg/min 2. Daily aPTT 3. Decrease vancomycin to 750 IV q24 hours -start tonight 5. Continue zosyn 2.25g q8 hours to reduce twitching  Erin Hearing PharmD., BCPS Clinical Pharmacist Pager [phone] 10/04/2013 2:25 PM

## 2013-10-04 NOTE — Progress Notes (Signed)
EEG cancelled by Dr Armida Sans- pt on continuous dialysis

## 2013-10-04 NOTE — Progress Notes (Signed)
ANTICOAGULATION CONSULT NOTE   Pharmacy Consult:  Argatroban Indication:  Thromboembolic state  Allergies  Allergen Reactions  . Crestor [Rosuvastatin] Other (See Comments)    Severe muscle weakness  . Nsaids Other (See Comments)    Not allergic, "bad on my kidneys"  . Ciprofloxacin Rash    Patient Measurements: Height: 5' 7.5" (171.5 cm) Weight: 251 lb 1.7 oz (113.9 kg) IBW/kg (Calculated) : 62.75  Vital Signs: Temp: 97.8 F (36.6 C) (06/18 0755) Temp src: Oral (06/18 0755) BP: 98/53 mmHg (06/18 0800) Pulse Rate: 116 (06/18 0800)  Labs:  Recent Labs  10/02/13 0355  10/03/13 0400 10/03/13 1600 10/04/13 0500  HGB 8.0*  --  7.8*  --  7.1*  HCT 26.7*  --  25.6*  --  24.0*  PLT 282  --  254  --  274  APTT 52*  --  51*  --  47*  CREATININE 2.06*  < > 1.92* 2.20* 2.35*  < > = values in this interval not displayed.  Estimated Creatinine Clearance: 39.7 ml/min (by C-G formula based on Cr of 2.35).  Assessment: 14 YOF with right foot ischemia s/p femoral endarterectomy and thrombectomy on 09/17/13.  S/p fasciotomy closure on 09/19/13. HIT panel negative. Continues on CVVHD until able to transition to iHD.  Argatroban aPTTs have been trending down and were low at 47sec on 0.35mcg/kg/min this morning. Hgb is also down to 7.1 this morning, plt stable, no bleeding noted      I&D for wound infection 6/12 now with wound vac, last change 6/17- holding warfarin until vascular ok with starting.  Goal of Therapy:  aPTT 50 - 90 seconds Monitor platelets by anticoagulation protocol: Yes   Plan 1. Increase Argatroban to 0.5 mcg/kg/min - recheck aptt in 4h 2. Daily aPTT  Erin Hearing PharmD., BCPS Clinical Pharmacist Pager [phone] 10/04/2013 8:18 AM

## 2013-10-04 NOTE — Progress Notes (Signed)
ADVANCED HEART FAILURE ROUNDING NOTE   SUBJECTIVE  43 yo female with PMH of DM1, stage V kidney disease, blindness, h/o lung CA s/p resection who was recently discharged for heart failure symptom had a high risk stress test which showed ischemia in anterior and apical region. She underwent cath 09/04/2013 which showed chronically occluded RCA and triple vessel dx. CT surgery consulted but not felt to be surgical candidate due to poor PFTs, suboptimal targets, comorbidities and shock. EF 22% by Myoview. 30-35% by echo  Underwent placement of swan and IABP for shock (co-ox 38%). Unfortunately developed cold foot and IABP had to be removed.Underwent R femoral thrombectomy and 4 compartment fasciotomy of RLE on 6/1.On 6/3 had VT/VF->PEA arrest 10 mins CPR. U/s shows R axillary DVT  Continues on CVVHD now giving back 25-50/hr. CVP 6  Levophed down to 2.5. Also on dobutamine 6. On midodrine 10 tid. Myoclonus slightly improved       CURRENT MEDS . aspirin  81 mg Oral Daily  . atorvastatin  80 mg Oral q1800  . calcitRIOL  0.25 mcg Oral Daily  . darbepoetin (ARANESP) injection - NON-DIALYSIS  200 mcg Subcutaneous Q Wed-1800  . feeding supplement (NEPRO CARB STEADY)  237 mL Oral TID WC  . insulin aspart  0-15 Units Subcutaneous TID WC  . insulin aspart  0-5 Units Subcutaneous QHS  . insulin aspart  3 Units Subcutaneous TID WC  . insulin glargine  40 Units Subcutaneous Q24H  . lidocaine (PF)  5 mL Other Once  . midodrine  10 mg Oral TID WC  . pantoprazole  40 mg Oral Daily  . piperacillin-tazobactam (ZOSYN)  IV  2.25 g Intravenous 3 times per day  . polyethylene glycol  17 g Oral Daily  . vancomycin  1,000 mg Intravenous Q24H    OBJECTIVE  Filed Vitals:   10/04/13 1100 10/04/13 1155 10/04/13 1200 10/04/13 1300  BP: 101/51  110/63 121/67  Pulse: 115  115 128  Temp:  98.4 F (36.9 C)    TempSrc:  Oral    Resp: 11  17 17   Height:      Weight:      SpO2: 100%  100% 100%     Intake/Output Summary (Last 24 hours) at 10/04/13 1400 Last data filed at 10/04/13 1300  Gross per 24 hour  Intake 1232.66 ml  Output   1220 ml  Net  12.66 ml   Filed Weights   10/02/13 0500 10/03/13 0500 10/04/13 0500  Weight: 114.5 kg (252 lb 6.8 oz) 113.6 kg (250 lb 7.1 oz) 113.9 kg (251 lb 1.7 oz)    PHYSICAL EXAM General:Lying in bed.  NAD Neuro: Alert and oriented X 3. Moves all extremities spontaneous HEENT:  Normal  Neck: Supple without bruits. Chest L subclav PC. R subclav TLC. Unable to see JVD Lungs:  clear Heart: tachy regular. +s3 Abdomen: Soft, non-tender, non-distended, BS + x 4.  Extremities: tr edema. R foot. Warm viable. Weak pulses Wound vac in R groin  CBC  Recent Labs  10/03/13 0400 10/04/13 0500  WBC 6.4 6.8  HGB 7.8* 7.1*  HCT 25.6* 24.0*  MCV 97.7 97.6  PLT 254 161   Basic Metabolic Panel  Recent Labs  10/03/13 0400 10/03/13 1600 10/04/13 0500  NA 135* 135* 135*  K 4.6 5.2 4.8  CL 98 98 97  CO2 26 24 26   GLUCOSE 278* 352* 191*  BUN 18 22 21   CREATININE 1.92* 2.20* 2.35*  CALCIUM 8.2* 8.1*  8.5  MG 2.6*  --  2.6*  PHOS 2.0* 2.3 2.8   Liver Function Tests  Recent Labs  10/02/13 1208  10/03/13 1600 10/04/13 0500  AST 15  --   --   --   ALT 16  --   --   --   ALKPHOS 133*  --   --   --   BILITOT 0.6  --   --   --   PROT 6.2  --   --   --   ALBUMIN 2.0*  < > 1.9* 1.9*  < > = values in this interval not displayed. Cardiac Enzymes No results found for this basename: CKTOTAL, CKMB, CKMBINDEX, TROPONINI,  in the last 72 hours Thyroid Function Tests  Recent Labs  10/02/13 1208  TSH 2.350    TELE  Sinus tachycardia 100-110   Radiology/Studies  Dg Chest 2 View  09/06/2013   CLINICAL DATA:  CHF, intermittent dyspnea  EXAM: CHEST  2 VIEW  COMPARISON:  Prior chest x-ray 08/13/2013; prior chest CT 08/22/2013  FINDINGS: Stable cardiac and mediastinal contours. Atherosclerotic calcifications are present within the  transverse aorta. Moderate left and small right pleural effusions are similar compared to prior. There is persistent associated bibasilar atelectasis. Mild pulmonary vascular congestion without overt edema. No pneumothorax. No new focal airspace consolidation. No acute osseous abnormality.  IMPRESSION: 1. Stable moderate left and small right pleural effusions and associated bibasilar atelectasis. 2. Pulmonary vascular congestion without overt edema.   Electronically Signed   By: Jacqulynn Cadet M.D.   On: 09/06/2013 07:55   Ct Chest Wo Contrast  08/22/2013   CLINICAL DATA:  Left lung cancer status post lobectomy. Weight gain. Renal insufficiency.  EXAM: CT CHEST WITHOUT CONTRAST  TECHNIQUE: Multidetector CT imaging of the chest was performed following the standard protocol without IV contrast.  COMPARISON:  Radiographs 08/13/2013 and 08/04/2013.  CT 08/02/2012.  FINDINGS: There are stable postsurgical changes related to prior left lower lobe resection. There has been interval enlargement of several mediastinal lymph nodes. These include 12 mm right paratracheal (image 19), 13 mm precarinal (image 23), and 14 mm AP window (image 25) lymph nodes. Some of these nodes have retained fatty hila. Allowing for the limitations of noncontrast technique, the hila appear stable.  There is stable low-density within the right thyroid lobe. Atherosclerosis of the aorta, great vessels and coronary arteries is noted. The heart size is normal. There is no pericardial effusion.  Moderate size dependent pleural effusions are present bilaterally. There is associated dependent airspace disease in both lower lobes. In addition, there are more focal airspace opacities within the right lower lobe and inferior aspect of the left upper lobe. The latter is somewhat nodular, measuring up to 1.8 cm on image 43. No other focal nodularity or endobronchial lesions are demonstrated.  The visualized upper abdomen has a stable appearance. There is  no adrenal mass. There is increased subcutaneous edema throughout the subcutaneous fat, especially within the anterior aspect of the upper abdomen.  No worrisome osseous findings are demonstrated.  IMPRESSION: 1. As demonstrated radiographically, there are bilateral pleural effusions and bilateral airspace opacities which are new compared with the prior CT. Although there are focal somewhat nodular components in the right lower and left upper lobes, these findings are most likely infectious/inflammatory. 2. Progressive mediastinal lymphadenopathy. Some of the nodes have retained fatty hila and may be reactive. Metastatic disease cannot be completely excluded. 3. Stable atherosclerosis and low-density within the right thyroid lobe.  4. If the airspace opacities and pleural effusions fail to respond to appropriate clinical therapy, follow-up CT or PET-CT may be warranted to exclude metastatic disease.   Electronically Signed   By: Camie Patience M.D.   On: 08/22/2013 10:26   Dg Chest Portable 1 View  08/13/2013   CLINICAL DATA:  Shortness of breath increasing for 2 weeks. History of CHF.  EXAM: PORTABLE CHEST - 1 VIEW  COMPARISON:  08/04/2013  FINDINGS: Again noted are bibasilar lung densities that are suggestive for pleural effusions and atelectasis. Heart size appears to be enlarged. There is some peribronchial thickening and cannot exclude pulmonary edema.  IMPRESSION: Bilateral pleural effusions with basilar atelectasis. There is mild pulmonary edema. Minimal change from the previous examination.   Electronically Signed   By: Markus Daft M.D.   On: 08/13/2013 23:10    ASSESSMENT AND PLAN 1. Cardiogenic shock 2. Acute on chronic systolic HF     EF ~40-37% 3. 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 Acute respiratory failure 5. 3-V CAD 6. Carotid stenosis     - doppler 08/31/2013 shows R 60-79% ICA stenosis, L 80-99% ICA stenosis     - vascular surgery aware 7. Lung CA  h/o LLL lobectomy (2 yr ago due to non-small cell  carcinoma)      - PFT 09/05/2013 pre-FEV1 28%, pre-FVC 34%, pre-FEV1/FVC ratio 68 8. DM1 9. Ischemic R foot s/p R femoral thrombectomy and 4-compartment fasciotomy.  10. VT/VF arrest 6/3 11. R axillary DVT 12. Severe protein calorie malnutrition  13. Large right groin wound s/p debridement 6/12  No real change. Levophed weaned to 2.5 but still dependent I am still not convinced we will be able to liberate her from inotropes to permit HD. Renal feels placement of PD catheter not possible at this time due to risk of infection from groin wound. Consider addition of Florinef as tolerated  Wound management per [facility] and [facility]ound Care. Continue abx for now. I am hopeful we can start coumadin soon.   CBGs improving on high dose Lantus.    Kanon Colunga,MD 2:00 PM

## 2013-10-04 NOTE — Progress Notes (Addendum)
[facility]   Subjective  - No new complaints.  She does get up to the chair and bedside commode.     Objective 89/67 120 98.2 F (36.8 C) (Oral) 12 100%  Intake/Output Summary (Last 24 hours) at 10/04/13 0718 Last data filed at 10/04/13 0700  Gross per 24 hour  Intake 1401.48 ml  Output    936 ml  Net 465.48 ml    Right groin wound vac intact Distally doppler dp signals left stronger than right Plantar 1st, 3erd and 4th toes dark warm to touch.  Dorsum of toes has good cap refill <3 s.   Assessment/Planning: POD #6 right groin debridement  09/19/2013 Closure of right leg fasciotomies wounds well healed we may remove the staples later today.  Ischemic R foot s/p R femoral thrombectomy and 4-compartment fasciotomy  Cardiogenic shock Carotid stenosis R 60-79% ICA stenosis, L 80-99% ICA stenosis  Cont. Argatroban and IV antibiotics.  Wound vac change today Systolic dropped to 78 yesterday Levophed increased to 5 mcg and now reduced to 3.  Plan keep systolic BP > 90.  Wound vac was changed yesterday.  Dr. Trula Slade was called to see wound.  Staples were removed patient tolerated this well. 3:30 pm  Laurence Slate [facility] 10/04/2013 7:18 AM --  Laboratory Lab Results:  Recent Labs  10/03/13 0400 10/04/13 0500  WBC 6.4 6.8  HGB 7.8* 7.1*  HCT 25.6* 24.0*  PLT 254 274   BMET  Recent Labs  10/03/13 1600 10/04/13 0500  NA 135* 135*  K 5.2 4.8  CL 98 97  CO2 24 26  GLUCOSE 352* 191*  BUN 22 21  CREATININE 2.20* 2.35*  CALCIUM 8.1* 8.5    COAG Lab Results  Component Value Date   INR 2.17* 09/29/2013   INR 2.59* 09/28/2013   INR 2.58* 09/27/2013   No results found for this basename: PTT

## 2013-10-04 NOTE — Progress Notes (Signed)
Assessment:  1. Dialysis dependent anuric AoCKD5, now ESRD, cardiogenic shock, cont CRRT (Dialysis dependent since 5/21; current access is left TDC placed 5/27' 5/21 CRRT->IHD (5/26) ->back to CRRT 6/4 ; weight down 131->113.6 with CRRT;trying to keep volume even past 72 hours - not pulling fluid. Will allow slightly + balance to +25-50/hour for now and keep eye on BP and CVP.  As previously stated will have to be able to maintain BP off systemic pressors and inotropes in order to transition to intermittent HD. Like Dr. Jeffie Pollock, I am not sure this will be possible... With current open groin wound, placement of PD catheter would carry great risk of infection due to proximity of the groin wound so possibility MAYBE after wound healed/infection cleared... 2. Hypophosphatemia - s/p 10 mmoles IV 6/15; phos remains stable 3. 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 S/p R femoral endarterectomy wound complications, thrombectomy , and fasciotomy - wound VAC in place; notes reviewed. On vanco and zosyn with VAC changes per [facility]; we reduced zosyn yesterday to see if any impact on myoclonus 6. CAD, ischemic CM 7. Anemia - on darbe 200/week; not at goal; iron studies appear "adequate"  8. Twitching/myoclonus - etiology of this not obvious to me... I would agree with notion of stopping zosyn to see if this is cause for her myoclonus - if alternative ATB possible; pharmacy has recommended at least a dosage reduction in zosyn to 2.25 grams Q8H pending any final decision to change to alt ATB so have dropped dose as of 6/17 9. Lung CA with prior LLLobectomy 10. Right axillary DVT 11. Bilateral carotid stenosis 12. Diabetes - insulin  Subjective: Interval History: Complains of twitching as she has for several days - but it seems less prominent to me today than last 2 days Only pain is in  groin Legs buckled when tried to get up last PM  Objective: Vital signs in last 24 hours: Temp:  [97.8 F (36.6 C)-98.3 F (36.8 C)] 97.8 F (36.6 C) (06/18 0755) Pulse Rate:  [111-120] 116 (06/18 0800) Resp:  [10-24] 14 (06/18 0800) BP: (78-115)/(43-87) 98/53 mmHg (06/18 0800) SpO2:  [97 %-100 %] 100 % (06/18 0800) Weight:  [113.9 kg (251 lb 1.7 oz)] 113.9 kg (251 lb 1.7 oz) (06/18 0500) Weight change: 0.3 kg (10.6 oz)  Current CRRT Prescription:  Start Date: restarted 09/20/13  Catheter: TDC left BFR: 200  Pre Blood Pump: 300 DFR: 1500  Replacement Rate: 200  Goal UF keeping volume even (since 6/16); starting + 25-50/hour 6/18  Anticoagulation: argatroban systemic, clotted on heparin, +HIT  Clotting: not excessive  WEIGHT TRENDING 10/04/13 0500 113.9 kg 10/03/13 0500 113.6 kg  10/02/13 0500 114.5 kg  10/01/13 0648 114.6 kg  09/30/13 0500 117.6 kg  09/29/13 0600 121.7 kg  09/28/13 0600 123.8 kg 09/27/13 0500 127.2 kg  09/26/13 0500 130.9 kg  09/25/13 0500 133.5 kg 09/24/13 0600 134.5 kg  09/23/13 0700 132.8 kg  09/22/13 0500 131.1 kg  09/21/13 0500 129.6 kg  09/20/13 0500 131 kg   CRRT restarted  PHYSICAL EXAMINATION General appearance: alert and cooperative, sitting up in bed, NAD BP 98/53  Pulse 116  Temp(Src) 97.8 F (36.6 C) (Oral)  Resp 14  Ht 5' 7.5" (1.715 m)  Wt 113.9 kg (251 lb 1.7 oz)  BMI 38.73 kg/m2  SpO2 100%  LMP 08/19/2013 Mouth  less twitching Alert,oriented, pleasant  Central line R and TDC L Resp: clear to auscultation bilaterally anteriorly  TDC dressing dry and intact Cardio: regular rate and rhythm, S1, S2 normal, no murmur, click, rub or gallop GI: soft, non-tender; bowel sounds normal; no masses,  no organomegaly Extremities: edema less edematous, RLE post op;  Wound VAC in place; stapled incisions clean and dry on LE's Dusky tips of toes right foot #3-5 unchanged  Intake/Output from previous day: 06/14 0701 - 06/15 0700 In: 998.1  [I.V.:623.1; IV Piggyback:375] Out: 5433 [Drains:100]  Recent Labs  10/03/13 0400 10/03/13 1600 10/04/13 0500  NA 135* 135* 135*  K 4.6 5.2 4.8  CL 98 98 97  CO2 26 24 26   GLUCOSE 278* 352* 191*  BUN 18 22 21   CREATININE 1.92* 2.20* 2.35*  CALCIUM 8.2* 8.1* 8.5  MG 2.6*  --  2.6*  PHOS 2.0* 2.3 2.8    Recent Labs  10/02/13 1208  10/03/13 1600 10/04/13 0500  AST 15  --   --   --   ALT 16  --   --   --   ALKPHOS 133*  --   --   --   BILITOT 0.6  --   --   --   PROT 6.2  --   --   --   ALBUMIN 2.0*  < > 1.9* 1.9*  < > = values in this interval not displayed.  Recent Labs  10/03/13 0400 10/04/13 0500  WBC 6.4 6.8  HGB 7.8* 7.1*  HCT 25.6* 24.0*  MCV 97.7 97.6  PLT 254 274   Results for Hannah, [name] (MRN [id number]) as of 10/02/2013 09:13  Ref. Range 10/01/2013 08:30  Iron Latest Range: 42-135 ug/dL 45  UIBC Latest Range: 125-400 ug/dL 164  TIBC Latest Range: 250-470 ug/dL 209 (L)  Saturation Ratios Latest Range: 20-55 % 22  Ferritin Latest Range: 10-291 ng/mL 399 (H)   Results for Hannah, [name] (MRN [id number]) as of 10/02/2013 09:13  Ref. Range 10/02/2013 03:55  Phosphorus Latest Range: 2.3-4.6 mg/dL 2.4    Scheduled Medications: . aspirin  81 mg Oral Daily  . atorvastatin  80 mg Oral q1800  . calcitRIOL  0.25 mcg Oral Daily  . darbepoetin (ARANESP) injection - NON-DIALYSIS  200 mcg Subcutaneous Q Wed-1800  . feeding supplement (NEPRO CARB STEADY)  237 mL Oral TID WC  . insulin aspart  0-15 Units Subcutaneous TID WC  . insulin aspart  0-5 Units Subcutaneous QHS  . insulin glargine  40 Units Subcutaneous Q24H  . lidocaine (PF)  5 mL Other Once  . midodrine  10 mg Oral TID WC  . pantoprazole  40 mg Oral Daily  . piperacillin-tazobactam (ZOSYN)  IV  2.25 g Intravenous 3 times per day  . polyethylene glycol  17 g Oral Daily  . sodium chloride  10-40 mL Intracatheter Q12H  . vancomycin  1,000 mg Intravenous Q24H  Infusion Medicications . sodium  chloride Stopped (10/01/13 2000)  . argatroban 0.5 mcg/kg/min (10/04/13 0818)  . DOBUTamine 6 mcg/kg/min (10/04/13 0800)  . norepinephrine (LEVOPHED) Adult infusion 3 mcg/min (10/04/13 0800)  . dialysis replacement fluid (prismasate) 300 mL/hr at 10/04/13 0600  . dialysis replacement fluid (prismasate) 200 mL/hr at 10/04/13 0230  . dialysate (PRISMASATE) 1,500 mL/hr at 10/04/13 0500    LOS: 31 days   [name],Hannah B 10/04/2013,8:47 AM

## 2013-10-05 LAB — CBC
HCT: 22.2 % — ABNORMAL LOW (ref 36.0–46.0)
HCT: 22.5 % — ABNORMAL LOW (ref 36.0–46.0)
HEMOGLOBIN: 6.7 g/dL — AB (ref 12.0–15.0)
Hemoglobin: 6.7 g/dL — CL (ref 12.0–15.0)
MCH: 29.3 pg (ref 26.0–34.0)
MCH: 28.9 pg (ref 26.0–34.0)
MCHC: 30.2 g/dL (ref 30.0–36.0)
MCHC: 29.8 g/dL — ABNORMAL LOW (ref 30.0–36.0)
MCV: 96.9 fL (ref 78.0–100.0)
MCV: 97 fL (ref 78.0–100.0)
PLATELETS: 253 10*3/uL (ref 150–400)
Platelets: 255 10*3/uL (ref 150–400)
RBC: 2.29 MIL/uL — AB (ref 3.87–5.11)
RBC: 2.32 MIL/uL — ABNORMAL LOW (ref 3.87–5.11)
RDW: 17.9 % — ABNORMAL HIGH (ref 11.5–15.5)
RDW: 18 % — ABNORMAL HIGH (ref 11.5–15.5)
WBC: 6.9 10*3/uL (ref 4.0–10.5)
WBC: 6.2 10*3/uL (ref 4.0–10.5)

## 2013-10-05 LAB — GLUCOSE, CAPILLARY
GLUCOSE-CAPILLARY: 183 mg/dL — AB (ref 70–99)
GLUCOSE-CAPILLARY: 254 mg/dL — AB (ref 70–99)
Glucose-Capillary: 282 mg/dL — ABNORMAL HIGH (ref 70–99)
Glucose-Capillary: 240 mg/dL — ABNORMAL HIGH (ref 70–99)

## 2013-10-05 LAB — CARBOXYHEMOGLOBIN
CARBOXYHEMOGLOBIN: 1.7 % — AB (ref 0.5–1.5)
CARBOXYHEMOGLOBIN: 1.7 % — AB (ref 0.5–1.5)
METHEMOGLOBIN: 0.7 % (ref 0.0–1.5)
METHEMOGLOBIN: 0.6 % (ref 0.0–1.5)
O2 SAT: 43.8 %
O2 Saturation: 46.5 %
Total hemoglobin: 11.2 g/dL — ABNORMAL LOW (ref 12.0–16.0)
Total hemoglobin: 11.8 g/dL — ABNORMAL LOW (ref 12.0–16.0)

## 2013-10-05 LAB — RENAL FUNCTION PANEL
ALBUMIN: 1.9 g/dL — AB (ref 3.5–5.2)
ALBUMIN: 2 g/dL — AB (ref 3.5–5.2)
BUN: 22 mg/dL (ref 6–23)
BUN: 23 mg/dL (ref 6–23)
CALCIUM: 8.3 mg/dL — AB (ref 8.4–10.5)
CO2: 27 mEq/L (ref 19–32)
CO2: 25 mEq/L (ref 19–32)
CREATININE: 2.05 mg/dL — AB (ref 0.50–1.10)
Calcium: 8.3 mg/dL — ABNORMAL LOW (ref 8.4–10.5)
Chloride: 98 mEq/L (ref 96–112)
Chloride: 96 mEq/L (ref 96–112)
Creatinine, Ser: 1.92 mg/dL — ABNORMAL HIGH (ref 0.50–1.10)
GFR calc Af Amer: 33 mL/min — ABNORMAL LOW (ref >90)
GFR calc non Af Amer: 28 mL/min — ABNORMAL LOW (ref >90)
GFR calc non Af Amer: 30 mL/min — ABNORMAL LOW (ref >90)
GFR, EST AFRICAN AMERICAN: 35 mL/min — AB (ref >90)
Glucose, Bld: 206 mg/dL — ABNORMAL HIGH (ref 70–99)
Glucose, Bld: 257 mg/dL — ABNORMAL HIGH (ref 70–99)
PHOSPHORUS: 2.3 mg/dL (ref 2.3–4.6)
POTASSIUM: 4.8 meq/L (ref 3.7–5.3)
POTASSIUM: 4.9 meq/L (ref 3.7–5.3)
Phosphorus: 2.3 mg/dL (ref 2.3–4.6)
SODIUM: 135 meq/L — AB (ref 137–147)
Sodium: 135 mEq/L — ABNORMAL LOW (ref 137–147)

## 2013-10-05 LAB — MAGNESIUM: MAGNESIUM: 2.6 mg/dL — AB (ref 1.5–2.5)

## 2013-10-05 LAB — APTT: aPTT: 60 seconds — ABNORMAL HIGH (ref 24–37)

## 2013-10-05 LAB — PREPARE RBC (CROSSMATCH)

## 2013-10-05 LAB — CORTISOL: Cortisol, Plasma: 22.6 ug/dL

## 2013-10-05 MED ORDER — SODIUM CHLORIDE 0.9 % IV SOLN
1.0000 g | Freq: Two times a day (BID) | INTRAVENOUS | Status: AC
  Administered 2013-10-05 – 2013-10-11 (×13): 1 g via INTRAVENOUS
  Filled 2013-10-05 (×13): qty 1

## 2013-10-05 MED ORDER — FLUDROCORTISONE ACETATE 0.1 MG PO TABS
0.1000 mg | ORAL_TABLET | Freq: Every day | ORAL | Status: DC
  Administered 2013-10-05 – 2013-10-25 (×21): 0.1 mg via ORAL
  Filled 2013-10-05 (×21): qty 1

## 2013-10-05 NOTE — Progress Notes (Signed)
[facility] MD called and made aware of unattached edges on pt right leg incision post staple removal. No new orders at this time.

## 2013-10-05 NOTE — Progress Notes (Addendum)
Pharmacy Consult Note  Pharmacy Consult:  Argatroban  Indication:  Thromboembolic state  Pharmacy Consult : Vancomycin and zosyn>>meropenem Indication: groin wound infection  Patient Measurements: Height: 5' 7.5" (171.5 cm) Weight: 258 lb 6.1 oz (117.2 kg) IBW/kg (Calculated) : 62.75  Vital Signs: Temp: 97.8 F (36.6 C) (06/19 0733) Temp src: Oral (06/19 0733) BP: 102/68 mmHg (06/19 0745) Pulse Rate: 117 (06/19 0745)  Labs:  Recent Labs  10/04/13 0500 10/04/13 1200 10/04/13 1630 10/05/13 0430 10/05/13 0628  HGB 7.1*  --   --  6.7* 6.7*  HCT 24.0*  --   --  22.2* 22.5*  PLT 274  --   --  255 253  APTT 47* 56*  --  60*  --   CREATININE 2.35*  --  2.11* 2.05*  --     Estimated Creatinine Clearance: 46.3 ml/min (by C-G formula based on Cr of 2.05).  Assessment: 76 YOF with right foot ischemia s/p femoral endarterectomy and thrombectomy on 09/17/13.  S/p fasciotomy closure on 09/19/13. HIT panel negative. Continues on CVVHD.   Argatroban continues to be therapeutic on 0.48mcg/kg/min (aPTT 60 sec),H/h trending down (6.7) this morning, plt stable, no bleeding noted. Patient to receive 2 units today.     Groin site infection: no fevers noted, wbc normal. Patient currently on day #9 of vancomycin and #6 of zosyn. Patient continues on CRRT, antibiotics have been dose adjusted accordingly.   Vanc trough done 6/18 and adjusted accordingly, recheck next week unless renal replacement changes.  6/3 bld - ng 6/10 wound (leg) - multiple bacteria (F)  Goal of Therapy:  aPTT 50 - 90 seconds Monitor platelets by anticoagulation protocol: Yes Vancomycin trough 15-20   Plan 1. Continue Argatroban at 0.5 mcg/kg/min 2. Daily aPTT 3. Continue vancomycin to 750 IV q24 hours 5. Continue zosyn 2.25g q8 hours to reduce twitching  Erin Hearing PharmD., BCPS Clinical Pharmacist Pager [phone] 10/05/2013 8:05 AM  Addendum:  Discussion this morning with Dr.Bensimhon about zosyn and  twitching. Will go ahead and d/c zosyn now and change to meropenem. There is still some chance of twitching with meropenem also but very few case reports. Coverage is very similar. Will dose q12h while patient is receiving CRRT.    Plan D/c zosyn Start meropenem 1g q12 hours  10/05/2013 10:42 AM

## 2013-10-05 NOTE — Progress Notes (Signed)
Assessment:  1. Dialysis dependent anuric AoCKD5, now ESRD, cardiogenic shock, cont CRRT (Dialysis dependent since 5/21; current access is left TDC placed 5/27;  5/21 CRRT->IHD 5/26 ->back to CRRT 6/4 ; weight down 131->nadir of 113.6 with CRRT; Goal yesterday was to allow slightly + balance to +25-50/hour  - for unclear reasons weight up 4 kg; CVP up to 12; will keep even again at this time.   As previously stated will have to be able to maintain BP off systemic pressors and inotropes in order to transition to intermittent HD. Like Dr. Jeffie Pollock, I am not sure this will be possible... With current open groin wound, placement of PD catheter would carry great risk of infection due to proximity of the groin wound so possibility MAYBE after wound healed/infection cleared... 2. Hypophosphatemia - s/p 10 mmoles IV 6/15; phos remains stable 3. Cardiogenic shock,  Ischemic and not a CABG candidate; EF 22% by myoview; 30-35 by echo as per #1; on NE/dobutamine,midodrine; NE down to 1.5; Dr. 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 S/p R femoral endarterectomy wound complications, thrombectomy , and fasciotomy - wound VAC in place; notes reviewed. On vanco and zosyn with VAC changes per VVS; has had 10 days ATB's. 6. CAD, ischemic CM 7. Anemia - on darbe 200/week; not at goal; iron studies appear "adequate" ; for 2 units today for Hb of 6.9 8. Twitching/myoclonus - etiology of this not obvious to me... I would agree with notion of stopping zosyn to see if this is cause for her myoclonus - if alternative ATB possible; pharmacy has recommended at least a dosage reduction in zosyn to 2.25 grams Q8H pending any final decision to change to alt ATB so have dropped dose as of 6/17 9. Lung CA with prior LL Lobectomy 10. Right axillary DVT 11. Bilateral carotid stenosis 12. Diabetes  - insulin  Subjective: Interval History:  Twitching a little more today (was better yesterday) Not SOB To get blood today for Hb 6.9  Objective: Vital signs in last 24 hours: Temp:  [97.7 F (36.5 C)-98.4 F (36.9 C)] 97.8 F (36.6 C) (06/19 0733) Pulse Rate:  [113-128] 117 (06/19 0745) Resp:  [11-24] 13 (06/19 0745) BP: (83-121)/(24-82) 102/68 mmHg (06/19 0745) SpO2:  [93 %-100 %] 100 % (06/19 0745) Weight:  [117.2 kg (258 lb 6.1 oz)] 117.2 kg (258 lb 6.1 oz) (06/19 0500) Weight change: 3.3 kg (7 lb 4.4 oz)  Current CRRT Prescription:  Start Date: restarted 09/20/13  Catheter: TDC left BFR: 200  Pre Blood Pump: 300 DFR: 1500  Replacement Rate: 200  Goal UF      Volume even (since 6/16)     + 25-50/hour 6/18 Anticoagulation: argatroban systemic, clotted on heparin, +HIT  Clotting: not excessive  WEIGHT TRENDING 10/05/13 0500 117.2 kg (repeated - same) 10/04/13 0500 113.9 kg 10/03/13 0500 113.6 kg  10/02/13 0500 114.5 kg  10/01/13 0648 114.6 kg  09/30/13 0500 117.6 kg  09/29/13 0600 121.7 kg  09/28/13 0600 123.8 kg 09/27/13 0500 127.2 kg  09/26/13 0500 130.9 kg  09/25/13 0500 133.5 kg 09/24/13 0600 134.5 kg  09/23/13 0700 132.8 kg  09/22/13 0500 131.1 kg  09/21/13 0500 129.6 kg  09/20/13 0500 131 kg   CRRT restarted  PHYSICAL EXAMINATION General appearance: alert and cooperative, sitting up in bed, NAD BP 102/68  Pulse 117  Temp(Src) 97.8  F (36.6 C) (Oral)  Resp 13  Ht 5' 7.5" (1.715 m)  Wt 117.2 kg (258 lb 6.1 oz)  BMI 39.85 kg/m2  SpO2 100%  LMP 08/19/2013 CVP 12 (up from 4) Mouth twitching Alert,oriented, pleasant   Central line R and TDC L Resp: clear to auscultation bilaterally anteriorly   [facility] dressing dry and intact Cardio: regular rate and rhythm, tachy at 120 S1, S2 normal, no murmur, click, rub or gallop GI: soft, non-tender; bowel sounds normal Extremities: 1+ edema to thighs Wound VAC in place R Groin; stapled incisions clean and dry  on LE's Dusky tips of toes right foot #3-5 unchanged  Intake/Output from previous day: 06/14 0701 - 06/15 0700 In: 998.1 [I.V.:623.1; IV Piggyback:375] Out: 5433 [Drains:100]  Recent Labs  10/04/13 0500 10/04/13 1630 10/05/13 0430  NA 135* 134* 135*  K 4.8 5.1 4.8  CL 97 98 98  CO2 26 26 27   GLUCOSE 191* 286* 206*  BUN 21 22 22   CREATININE 2.35* 2.11* 2.05*  CALCIUM 8.5 7.9* 8.3*  MG 2.6*  --  2.6*  PHOS 2.8 2.3 2.3    Recent Labs  10/02/13 1208  10/04/13 1630 10/05/13 0430  AST 15  --   --   --   ALT 16  --   --   --   ALKPHOS 133*  --   --   --   BILITOT 0.6  --   --   --   PROT 6.2  --   --   --   ALBUMIN 2.0*  < > 1.9* 1.9*  < > = values in this interval not displayed.  Recent Labs  10/05/13 0430 10/05/13 0628  WBC 6.9 6.2  HGB 6.7* 6.7*  HCT 22.2* 22.5*  MCV 96.9 97.0  PLT 255 253   Results for POPPI, SCANTLING (MRN [id number]) as of 10/02/2013 09:13  Ref. Range 10/01/2013 08:30  Iron Latest Range: 42-135 ug/dL 45  UIBC Latest Range: 125-400 ug/dL 164  TIBC Latest Range: 250-470 ug/dL 209 (L)  Saturation Ratios Latest Range: 20-55 % 22  Ferritin Latest Range: 10-291 ng/mL 399 (H)   Results for JOMAIRA, DARR (MRN [id number]) as of 10/02/2013 09:13  Ref. Range 10/02/2013 03:55  Phosphorus Latest Range: 2.3-4.6 mg/dL 2.4    Scheduled Medications: . aspirin  81 mg Oral Daily  . atorvastatin  80 mg Oral q1800  . calcitRIOL  0.25 mcg Oral Daily  . darbepoetin (ARANESP) injection - NON-DIALYSIS  200 mcg Subcutaneous Q Wed-1800  . feeding supplement (NEPRO CARB STEADY)  237 mL Oral TID WC  . fludrocortisone  0.1 mg Oral Daily  . insulin aspart  0-15 Units Subcutaneous TID WC  . insulin aspart  0-5 Units Subcutaneous QHS  . insulin aspart  3 Units Subcutaneous TID WC  . insulin glargine  40 Units Subcutaneous Q24H  . lidocaine (PF)  5 mL Other Once  . midodrine  10 mg Oral TID WC  . pantoprazole  40 mg Oral Daily  . piperacillin-tazobactam (ZOSYN)   IV  2.25 g Intravenous 3 times per day  . polyethylene glycol  17 g Oral Daily  . vancomycin  750 mg Intravenous Q24H  Infusion Medicications . sodium chloride Stopped (10/01/13 2000)  . argatroban 0.5 mcg/kg/min (10/05/13 0700)  . DOBUTamine 6.001 mcg/kg/min (10/05/13 0700)  . norepinephrine (LEVOPHED) Adult infusion 1.25 mcg/min (10/05/13 0747)  . dialysis replacement fluid (prismasate) 300 mL/hr at 10/04/13 2336  . dialysis replacement fluid (prismasate) 200  mL/hr at 10/05/13 0200  . dialysate (PRISMASATE) 1,500 mL/hr at 10/05/13 0550    LOS: 32 days   DUNHAM,CYNTHIA B 10/05/2013,8:07 AM

## 2013-10-05 NOTE — Progress Notes (Signed)
ADVANCED HEART FAILURE ROUNDING NOTE   SUBJECTIVE  43 yo female with PMH of DM1, stage V kidney disease, blindness, h/o lung CA s/p resection who was recently discharged for heart failure symptom had a high risk stress test which showed ischemia in anterior and apical region. She underwent cath 09/04/2013 which showed chronically occluded RCA and triple vessel dx. CT surgery consulted but not felt to be surgical candidate due to poor PFTs, suboptimal targets, comorbidities and shock. EF 22% by Myoview. 30-35% by echo  Underwent placement of swan and IABP for shock (co-ox 38%). Unfortunately developed cold foot and IABP had to be removed.Underwent R femoral thrombectomy and 4 compartment fasciotomy of RLE on 6/1.On 6/3 had VT/VF->PEA arrest 10 mins CPR. U/s shows R axillary DVT  Continues on CVVHD now giving back 25-50/hr. CVP 10  Levophed down to 1.5. Also on dobutamine 6. On midodrine 10 tid. Denies SOB or dizziness. SBP 80s now. Marland Kitchen  Co-ox 47% (down from 57%) Hgb 6.9       CURRENT MEDS . aspirin  81 mg Oral Daily  . atorvastatin  80 mg Oral q1800  . calcitRIOL  0.25 mcg Oral Daily  . darbepoetin (ARANESP) injection - NON-DIALYSIS  200 mcg Subcutaneous Q Wed-1800  . feeding supplement (NEPRO CARB STEADY)  237 mL Oral TID WC  . insulin aspart  0-15 Units Subcutaneous TID WC  . insulin aspart  0-5 Units Subcutaneous QHS  . insulin aspart  3 Units Subcutaneous TID WC  . insulin glargine  40 Units Subcutaneous Q24H  . lidocaine (PF)  5 mL Other Once  . midodrine  10 mg Oral TID WC  . pantoprazole  40 mg Oral Daily  . piperacillin-tazobactam (ZOSYN)  IV  2.25 g Intravenous 3 times per day  . polyethylene glycol  17 g Oral Daily  . vancomycin  750 mg Intravenous Q24H    OBJECTIVE  Filed Vitals:   10/05/13 0445 10/05/13 0500 10/05/13 0515 10/05/13 0530  BP: 91/33 90/60 93/60  97/62  Pulse: 117 119 119 118  Temp:      TempSrc:      Resp: 21 16 24 20   Height:      Weight:  117.2 kg  (258 lb 6.1 oz)    SpO2: 100% 100% 100% 100%    Intake/Output Summary (Last 24 hours) at 10/05/13 0608 Last data filed at 10/05/13 0500  Gross per 24 hour  Intake 1798.46 ml  Output   1108 ml  Net 690.46 ml   Filed Weights   10/03/13 0500 10/04/13 0500 10/05/13 0500  Weight: 113.6 kg (250 lb 7.1 oz) 113.9 kg (251 lb 1.7 oz) 117.2 kg (258 lb 6.1 oz)    PHYSICAL EXAM General:Lying in bed.  NAD Neuro: Alert and oriented X 3. Moves all extremities spontaneous HEENT:  Normal  Neck: Supple without bruits. Chest L subclav PC. R subclav TLC. Unable to see JVD Lungs:  clear Heart: tachy regular. +s3 Abdomen: Soft, non-tender, non-distended, BS + x 4.  Extremities: tr edema. R foot. Warm viable. Weak pulses Wound vac in R groin  CBC  Recent Labs  10/04/13 0500 10/05/13 0430  WBC 6.8 6.9  HGB 7.1* 6.7*  HCT 24.0* 22.2*  MCV 97.6 96.9  PLT 274 382   Basic Metabolic Panel  Recent Labs  10/04/13 0500 10/04/13 1630 10/05/13 0430  NA 135* 134* 135*  K 4.8 5.1 4.8  CL 97 98 98  CO2 26 26 27   GLUCOSE 191* 286* 206*  BUN 21 22 22   CREATININE 2.35* 2.11* 2.05*  CALCIUM 8.5 7.9* 8.3*  MG 2.6*  --  2.6*  PHOS 2.8 2.3 2.3   Liver Function Tests  Recent Labs  10/02/13 1208  10/04/13 1630 10/05/13 0430  AST 15  --   --   --   ALT 16  --   --   --   ALKPHOS 133*  --   --   --   BILITOT 0.6  --   --   --   PROT 6.2  --   --   --   ALBUMIN 2.0*  < > 1.9* 1.9*  < > = values in this interval not displayed. Cardiac Enzymes No results found for this basename: CKTOTAL, CKMB, CKMBINDEX, TROPONINI,  in the last 72 hours Thyroid Function Tests  Recent Labs  10/02/13 1208  TSH 2.350    TELE  Sinus tachycardia 100-110   Radiology/Studies  Dg Chest 2 View  09/06/2013   CLINICAL DATA:  CHF, intermittent dyspnea  EXAM: CHEST  2 VIEW  COMPARISON:  Prior chest x-ray 08/13/2013; prior chest CT 08/22/2013  FINDINGS: Stable cardiac and mediastinal contours. Atherosclerotic  calcifications are present within the transverse aorta. Moderate left and small right pleural effusions are similar compared to prior. There is persistent associated bibasilar atelectasis. Mild pulmonary vascular congestion without overt edema. No pneumothorax. No new focal airspace consolidation. No acute osseous abnormality.  IMPRESSION: 1. Stable moderate left and small right pleural effusions and associated bibasilar atelectasis. 2. Pulmonary vascular congestion without overt edema.   Electronically Signed   By: Jacqulynn Cadet M.D.   On: 09/06/2013 07:55   Ct Chest Wo Contrast  08/22/2013   CLINICAL DATA:  Left lung cancer status post lobectomy. Weight gain. Renal insufficiency.  EXAM: CT CHEST WITHOUT CONTRAST  TECHNIQUE: Multidetector CT imaging of the chest was performed following the standard protocol without IV contrast.  COMPARISON:  Radiographs 08/13/2013 and 08/04/2013.  CT 08/02/2012.  FINDINGS: There are stable postsurgical changes related to prior left lower lobe resection. There has been interval enlargement of several mediastinal lymph nodes. These include 12 mm right paratracheal (image 19), 13 mm precarinal (image 23), and 14 mm AP window (image 25) lymph nodes. Some of these nodes have retained fatty hila. Allowing for the limitations of noncontrast technique, the hila appear stable.  There is stable low-density within the right thyroid lobe. Atherosclerosis of the aorta, great vessels and coronary arteries is noted. The heart size is normal. There is no pericardial effusion.  Moderate size dependent pleural effusions are present bilaterally. There is associated dependent airspace disease in both lower lobes. In addition, there are more focal airspace opacities within the right lower lobe and inferior aspect of the left upper lobe. The latter is somewhat nodular, measuring up to 1.8 cm on image 43. No other focal nodularity or endobronchial lesions are demonstrated.  The visualized upper  abdomen has a stable appearance. There is no adrenal mass. There is increased subcutaneous edema throughout the subcutaneous fat, especially within the anterior aspect of the upper abdomen.  No worrisome osseous findings are demonstrated.  IMPRESSION: 1. As demonstrated radiographically, there are bilateral pleural effusions and bilateral airspace opacities which are new compared with the prior CT. Although there are focal somewhat nodular components in the right lower and left upper lobes, these findings are most likely infectious/inflammatory. 2. Progressive mediastinal lymphadenopathy. Some of the nodes have retained fatty hila and may be reactive. Metastatic disease cannot  be completely excluded. 3. Stable atherosclerosis and low-density within the right thyroid lobe. 4. If the airspace opacities and pleural effusions fail to respond to appropriate clinical therapy, follow-up CT or PET-CT may be warranted to exclude metastatic disease.   Electronically Signed   By: Camie Patience M.D.   On: 08/22/2013 10:26   Dg Chest Portable 1 View  08/13/2013   CLINICAL DATA:  Shortness of breath increasing for 2 weeks. History of CHF.  EXAM: PORTABLE CHEST - 1 VIEW  COMPARISON:  08/04/2013  FINDINGS: Again noted are bibasilar lung densities that are suggestive for pleural effusions and atelectasis. Heart size appears to be enlarged. There is some peribronchial thickening and cannot exclude pulmonary edema.  IMPRESSION: Bilateral pleural effusions with basilar atelectasis. There is mild pulmonary edema. Minimal change from the previous examination.   Electronically Signed   By: Markus Daft M.D.   On: 08/13/2013 23:10    ASSESSMENT AND PLAN 1. Cardiogenic shock 2. Acute on chronic systolic HF     EF ~70-01% 3. 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 Acute respiratory failure 5. 3-V CAD 6. Carotid stenosis     - doppler 08/31/2013 shows R 60-79% ICA stenosis, L 80-99% ICA stenosis     - vascular surgery aware 7. Lung CA  h/o LLL  lobectomy (2 yr ago due to non-small cell carcinoma)      - PFT 09/05/2013 pre-FEV1 28%, pre-FVC 34%, pre-FEV1/FVC ratio 68 8. DM1 9. Ischemic R foot s/p R femoral thrombectomy and 4-compartment fasciotomy.  10. VT/VF arrest 6/3 11. R axillary DVT 12. Severe protein calorie malnutrition  13. Large right groin wound s/p debridement 6/12   Levophed weaned to 1.5 but still dependent. Co-ox dropping with wean of inotropes and I am increasingly concerned that we will not be able to liberate her from inotropes to permit HD. Renal feels placement of PD catheter not possible at this time due to risk of infection from groin wound. Wil re-check co-ox. Will add low-dose Florinef. Recent cortisol level was ok.   Co-ox back down to day with weaning of inotropes. Will repeat to confirm. She is also anemic and will give 2 unites RBCs.   Wound management per VVS and Wound Care. Continue abx for now. I am hopeful we can start coumadin soon.   CBGs improving on higher dose Lantus.    Daniel Bensimhon,MD 6:08 AM

## 2013-10-05 NOTE — Progress Notes (Signed)
CRITICAL VALUE ALERT  Critical value received:  Hemoglobin 6.7  Date of notification:  10/05/2013  Time of notification:  0522  Critical value read back:yes  Nurse who received alert:  Vivia Ewing  MD notified (1st page):  Brabham  Time of first page:  0538  MD notified (2nd page):  Time of second page:  Responding MD:  Trula Slade  Time MD responded:  [phone]  Will defer to attending MD on morning rounds

## 2013-10-05 NOTE — Progress Notes (Signed)
Inpatient Diabetes Program Recommendations  AACE/ADA: New Consensus Statement on Inpatient Glycemic Control (2013)  Target Ranges:  Prepandial:   less than 140 mg/dL      Peak postprandial:   less than 180 mg/dL (1-2 hours)      Critically ill patients:  140 - 180 mg/dL  Results for ALYIA, LACERTE (MRN [id number]) as of 10/05/2013 13:44  Ref. Range 10/04/2013 11:53 10/04/2013 17:46 10/04/2013 21:43 10/05/2013 07:32 10/05/2013 11:59  Glucose-Capillary Latest Range: 70-99 mg/dL 247 (H) 271 (H) 294 (H) 183 (H) 254 (H)   Consider increasing Novolog meal coverage to 5 units TID.   Thank you  Raoul Pitch BSN, RN,CDE Inpatient Diabetes Coordinator [phone] (team pager)

## 2013-10-05 NOTE — Progress Notes (Signed)
NEURO HOSPITALIST PROGRESS NOTE   SUBJECTIVE:                                                                                                                        Stated that " the twitching is slightly better, less frequent". No other neurological complains. On continuous dialysis.  OBJECTIVE:                                                                                                                           Vital signs in last 24 hours: Temp:  [97.7 F (36.5 C)-98.4 F (36.9 C)] 97.7 F (36.5 C) (06/19 1030) Pulse Rate:  [41-128] 41 (06/19 1100) Resp:  [11-24] 12 (06/19 1100) BP: (83-121)/(24-82) 105/56 mmHg (06/19 1100) SpO2:  [93 %-100 %] 100 % (06/19 1100) Weight:  [117.2 kg (258 lb 6.1 oz)] 117.2 kg (258 lb 6.1 oz) (06/19 0500)  Intake/Output from previous day: 06/18 0701 - 06/19 0700 In: 1899.2 [P.O.:1164; I.V.:435.2; IV Piggyback:300] Out: 1094  Intake/Output this shift: Total I/O In: 826.1 [P.O.:350; I.V.:117.8; Blood:358.3] Out: 383 [Other:383] Nutritional status: Diabetic  Past Medical History  Diagnosis Date  . High cholesterol   . Diabetic retinopathy   . Peripheral neuropathy     "tips of toes"  . Blind right eye   . CHF (congestive heart failure)   . CAD (coronary artery disease)   . GERD (gastroesophageal reflux disease)   . Hypertension   . History of lung cancer 07/2011    s/p left lower lobectomy  . Diabetes mellitus     IDDM  . Cataract of right eye   . Ulcer of toe of right foot 07/10/2012    great toe  . Breast calcification, left 06/2012  . Acute biphenotypic leukemia   . CKD (chronic kidney disease) stage 5, GFR less than 15 ml/min   . Nephrotic syndrome   . Gastritis     H/o gastritis on prior endoscopy  . Anemia   . Carotid artery disease     Neurologic Exam:  Mental Status:  Alert, oriented, thought content appropriate. Speech fluent without evidence of aphasia. Able to follow 3 step  commands without difficulty.  Cranial Nerves:  II: Discs flat bilaterally; blind in right  eye but able to see on all fields when looking with left eye, left pupil, round, reactive to light right pupil is noncreative.  III,IV, VI: ptosis not present, extra-ocular motions intact bilaterally with the right eye deviated laterally at rest and more pronounced deviation laterally with upward gaze.  V,VII: smile symmetric, facial light touch sensation normal bilaterally  VIII: hearing normal bilaterally  IX,X: gag reflex present  XI: bilateral shoulder shrug  XII: midline tongue extension without atrophy or fasciculations  Motor:  Moves all limbs spontaneously and symmetrically.  Sporadic spontaneous myoclonic movements noted upper extremities.  Sensory: Pinprick and light touch intact throughout, bilaterally with stocking distribution neuropathy  Deep Tendon Reflexes:  Right: Upper Extremity Left: Upper extremity  biceps (C-5 to C-6) 2/4 biceps (C-5 to C-6) 2/4  tricep (C7) 2/4 triceps (C7) 2/4  Brachioradialis (C6) 2/4 Brachioradialis (C6) 2/4  Lower Extremity Lower Extremity  quadriceps (L-2 to L-4) 0/4 quadriceps (L-2 to L-4) 0/4  Achilles (S1) 0/4 Achilles (S1) 0/4  Plantars:  Right: downgoing Left: downgoing  Cerebellar:  normal finger-to-nose, normal heel-to-shin test  Gait: not tested   Lab Results: Lab Results  Component Value Date/Time   CHOL 308* 11/22/2012  1:05 PM   Lipid Panel No results found for this basename: CHOL, TRIG, HDL, CHOLHDL, VLDL, LDLCALC,  in the last 72 hours  Studies/Results: No results found.  MEDICATIONS                                                                                                                        Scheduled: . aspirin  81 mg Oral Daily  . atorvastatin  80 mg Oral q1800  . calcitRIOL  0.25 mcg Oral Daily  . darbepoetin (ARANESP) injection - NON-DIALYSIS  200 mcg Subcutaneous Q Wed-1800  . feeding supplement (NEPRO CARB STEADY)   237 mL Oral TID WC  . fludrocortisone  0.1 mg Oral Daily  . insulin aspart  0-15 Units Subcutaneous TID WC  . insulin aspart  0-5 Units Subcutaneous QHS  . insulin aspart  3 Units Subcutaneous TID WC  . insulin glargine  40 Units Subcutaneous Q24H  . lidocaine (PF)  5 mL Other Once  . meropenem (MERREM) IV  1 g Intravenous Q12H  . midodrine  10 mg Oral TID WC  . pantoprazole  40 mg Oral Daily  . polyethylene glycol  17 g Oral Daily  . 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  Last dose zosyn today?Marland Kitchen Would like to see how she dopes after stopping zosyn. EEG if myoclonus persists after stopping zosyn. Will follow up.  Dorian Pod, MD [facility] [phone]  10/05/2013, 11:19 AM

## 2013-10-05 NOTE — Progress Notes (Signed)
  [facility] Progress Note  10/05/2013 11:44 AM 7 Days Post-Op  Subjective:  Pt complains of pain with wound vac change this am. No leg pain  Tmax 98.4 BP sys 80s-120x 02 100% RA  Filed Vitals:   10/05/13 1100  BP: 105/56  Pulse: 41  Temp:   Resp: 12    Physical Exam: Incisions:  Right groin with fibrinous tissue, wound vac seal intact, minimal serosanguinous drainage. Right lateral fasciotomy incision with increased dehiscence. Clotted blood in wound, no active drainage. Does not look infected. Some minor separation of medial fasciotomy incision.  Extremities: duskiness of right 3rd, 4th, and 5th toes. Bilateral DP doppler signals. Sensation intact to lower extremities. Motor intact feet bilaterally.   CBC    Component Value Date/Time   WBC 6.2 10/05/2013 0628   RBC 2.32* 10/05/2013 0628   HGB 6.7* 10/05/2013 0628   HCT 22.5* 10/05/2013 0628   PLT 253 10/05/2013 0628   MCV 97.0 10/05/2013 0628   MCH 28.9 10/05/2013 0628   MCHC 29.8* 10/05/2013 0628   RDW 18.0* 10/05/2013 0628   LYMPHSABS 1.4 08/04/2013 1223   MONOABS 0.4 08/04/2013 1223   EOSABS 0.2 08/04/2013 1223   BASOSABS 0.1 08/04/2013 1223    BMET    Component Value Date/Time   NA 135* 10/05/2013 0430   K 4.8 10/05/2013 0430   CL 98 10/05/2013 0430   CO2 27 10/05/2013 0430   GLUCOSE 206* 10/05/2013 0430   BUN 22 10/05/2013 0430   CREATININE 2.05* 10/05/2013 0430   CREATININE 4.17* 07/06/2013 1618   CALCIUM 8.3* 10/05/2013 0430   GFRNONAA 28* 10/05/2013 0430   GFRNONAA 71 07/13/2011 0835   GFRAA 33* 10/05/2013 0430   GFRAA 82 07/13/2011 0835    INR    Component Value Date/Time   INR 2.17* 09/29/2013 0330     Intake/Output Summary (Last 24 hours) at 10/05/13 1144 Last data filed at 10/05/13 1100  Gross per 24 hour  Intake 2295.29 ml  Output   1147 ml  Net 1148.29 ml     Assessment:  43 y.o. female is s/p:  Right groin debridement 7 Days Post-Op Closure of right leg fasciotomies 09/19/2013 S/p  Right femoral endarterectomy   Plan: -Wound vac in place to right groin, dressing changed today. Not a lot of granulation tissue in wound. Mostly fibrinous. Will continue wound vac and IV antibiotics.  -Right lateral fasciotomy incision: keep saline gauze lightly packed in wound. Wrap with kerlix.  -Pt continues to have bilateral doppler signals. Duskiness of toes unchanged. Will monitor.    Virgina Jock, PA-C [facility] Office: [phone] Pager: [phone] 10/05/2013 11:44 AM

## 2013-10-06 ENCOUNTER — Inpatient Hospital Stay (HOSPITAL_COMMUNITY): Payer: Medicare Other

## 2013-10-06 LAB — RENAL FUNCTION PANEL
ALBUMIN: 2 g/dL — AB (ref 3.5–5.2)
Albumin: 1.9 g/dL — ABNORMAL LOW (ref 3.5–5.2)
BUN: 22 mg/dL (ref 6–23)
BUN: 22 mg/dL (ref 6–23)
CALCIUM: 8.3 mg/dL — AB (ref 8.4–10.5)
CHLORIDE: 100 meq/L (ref 96–112)
CO2: 26 mEq/L (ref 19–32)
CO2: 27 meq/L (ref 19–32)
Calcium: 8.4 mg/dL (ref 8.4–10.5)
Chloride: 98 mEq/L (ref 96–112)
Creatinine, Ser: 2.29 mg/dL — ABNORMAL HIGH (ref 0.50–1.10)
Creatinine, Ser: 1.96 mg/dL — ABNORMAL HIGH (ref 0.50–1.10)
GFR calc Af Amer: 34 mL/min — ABNORMAL LOW (ref >90)
GFR calc non Af Amer: 25 mL/min — ABNORMAL LOW (ref >90)
GFR calc non Af Amer: 30 mL/min — ABNORMAL LOW (ref >90)
GFR, EST AFRICAN AMERICAN: 29 mL/min — AB (ref >90)
Glucose, Bld: 200 mg/dL — ABNORMAL HIGH (ref 70–99)
Glucose, Bld: 178 mg/dL — ABNORMAL HIGH (ref 70–99)
POTASSIUM: 5.3 meq/L (ref 3.7–5.3)
POTASSIUM: 4.9 meq/L (ref 3.7–5.3)
Phosphorus: 2.6 mg/dL (ref 2.3–4.6)
Phosphorus: 2 mg/dL — ABNORMAL LOW (ref 2.3–4.6)
SODIUM: 136 meq/L — AB (ref 137–147)
Sodium: 136 mEq/L — ABNORMAL LOW (ref 137–147)

## 2013-10-06 LAB — CBC
HCT: 27.5 % — ABNORMAL LOW (ref 36.0–46.0)
Hemoglobin: 8.4 g/dL — ABNORMAL LOW (ref 12.0–15.0)
MCH: 28.9 pg (ref 26.0–34.0)
MCHC: 30.5 g/dL (ref 30.0–36.0)
MCV: 94.5 fL (ref 78.0–100.0)
Platelets: 251 10*3/uL (ref 150–400)
RBC: 2.91 MIL/uL — ABNORMAL LOW (ref 3.87–5.11)
RDW: 18.2 % — ABNORMAL HIGH (ref 11.5–15.5)
WBC: 6.4 10*3/uL (ref 4.0–10.5)

## 2013-10-06 LAB — GLUCOSE, CAPILLARY
GLUCOSE-CAPILLARY: 190 mg/dL — AB (ref 70–99)
GLUCOSE-CAPILLARY: 192 mg/dL — AB (ref 70–99)
Glucose-Capillary: 197 mg/dL — ABNORMAL HIGH (ref 70–99)
Glucose-Capillary: 220 mg/dL — ABNORMAL HIGH (ref 70–99)

## 2013-10-06 LAB — TYPE AND SCREEN
ABO/RH(D): O POS
Antibody Screen: NEGATIVE
UNIT DIVISION: 0
Unit division: 0

## 2013-10-06 LAB — CARBOXYHEMOGLOBIN
Carboxyhemoglobin: 2.6 % — ABNORMAL HIGH (ref 0.5–1.5)
Methemoglobin: 0.9 % (ref 0.0–1.5)
O2 Saturation: 64.6 %
Total hemoglobin: 8.8 g/dL — ABNORMAL LOW (ref 12.0–16.0)

## 2013-10-06 LAB — APTT: APTT: 51 s — AB (ref 24–37)

## 2013-10-06 LAB — MAGNESIUM: MAGNESIUM: 2.5 mg/dL (ref 1.5–2.5)

## 2013-10-06 MED ORDER — WARFARIN SODIUM 5 MG PO TABS
5.0000 mg | ORAL_TABLET | Freq: Once | ORAL | Status: AC
  Administered 2013-10-06: 5 mg via ORAL
  Filled 2013-10-06: qty 1

## 2013-10-06 MED ORDER — WARFARIN - PHARMACIST DOSING INPATIENT
Freq: Every day | Status: DC
  Administered 2013-10-17 – 2013-10-28 (×4)

## 2013-10-06 MED ORDER — INSULIN ASPART 100 UNIT/ML ~~LOC~~ SOLN
6.0000 [IU] | Freq: Three times a day (TID) | SUBCUTANEOUS | Status: DC
  Administered 2013-10-06 – 2013-10-16 (×20): 6 [IU] via SUBCUTANEOUS

## 2013-10-06 NOTE — Progress Notes (Addendum)
Pharmacy Consult Note  Pharmacy Consult:  Argatroban  Indication:  Thromboembolic state  Pharmacy Consult : Vancomycin and zosyn>>meropenem Indication: groin wound infection  Patient Measurements: Height: 5' 7.5" (171.5 cm) Weight: 259 lb 14.8 oz (117.9 kg) IBW/kg (Calculated) : 62.75  Vital Signs: Temp: 97.7 F (36.5 C) (06/20 0750) Temp src: Oral (06/20 0750) BP: 127/61 mmHg (06/20 0930) Pulse Rate: 110 (06/20 0930)  Labs:  Recent Labs  10/04/13 1200  10/05/13 0430 10/05/13 0628 10/05/13 1700 10/06/13 0405 10/06/13 0500  HGB  --   --  6.7* 6.7*  --  8.4*  --   HCT  --   --  22.2* 22.5*  --  27.5*  --   PLT  --   --  255 253  --  251  --   APTT 56*  --  60*  --   --  51*  --   CREATININE  --   < > 2.05*  --  1.92*  --  2.29*  < > = values in this interval not displayed.  Estimated Creatinine Clearance: 41.5 ml/min (by C-G formula based on Cr of 2.29).  Assessment: 8 YOF admitted 09/03/2013  with right foot ischemia s/p femoral endarterectomy and thrombectomy on 09/17/13.  S/p fasciotomy closure on 09/19/13. HIT panel negative. Pharmacy consulted to dose argatroban, vancomycin and meropenem.   PMH: DM, PN, CM, CHF EF 22%, CAD, GERD, HTN, lung CA s/p lobectomy  Coag: Argatroban continues to be therapeutic on 0.75mcg/kg/min (aPTT 51ec),H/h back up after 2Units PRBCs on 6/19.  Plt stable, no bleeding noted.  Per vascular surgery ok to resume warfarin  ID: Groin site infection: no fevers noted, wbc normal. Patient currently on day #10 of vancomycin and #7 of total beta lactam, currently on meropenem. Patient continues on CRRT, antibiotics have been dose adjusted accordingly.   Vanc trough done 6/18 and adjusted accordingly, recheck next week unless renal replacement changes.  6/3 bld - ng 6/10 wound (leg) - multiple bacteria (F)  CV:  Cardiogenic shock, severe 3v CAD (not revascularized), EF 22%,  HR 110-130s, SBP 80-150s Dob@6 , NE off, fludrocortisone, ASA, atorva,  midodrine  Neuro: "twitching", 6/13 amio stopped, 6/19 changed to meropenem  Renal Continues on CVVHD. For iHD when off pressors  Goal of T herapy:  aPTT 50 - 90 seconds Monitor platelets by anticoagulation protocol: Yes Vancomycin trough 15-20   Plan 1. Continue Argatroban at 0.5 mcg/kg/min 2. Daily aPTT 3. Continue vancomycin to 750 IV q24 hours 4. Continue meropenem  5. Follow up on plan to start warfarin  Thank you for allowing pharmacy to be a part of this patients care team.  Rowe Robert Pharm.D., BCPS, AQ-Cardiology Clinical Pharmacist 10/06/2013 10:31 AM Pager: [phone] Phone: [phone]   11:14 AM Spoke with Dr. Haroldine Laws regarding vascular surgery's ok to start warfarin received consult for warfarin per pharmacy.  Will begin today with 5 mg (given significant  Amiodarone on board (stopped 6/13), Will check INR daily.  Baseline INR on argatroban 2.17 from 6/13, will use this as our starting point.  Once INR has incrementally increased from this argatroban baseline will hold argatroban and evaluate INR.   [place]

## 2013-10-06 NOTE — Progress Notes (Addendum)
ADVANCED HEART FAILURE ROUNDING NOTE   SUBJECTIVE  43 yo female with PMH of DM1, stage V kidney disease, blindness, h/o lung CA s/p resection who was recently discharged for heart failure symptom had a high risk stress test which showed ischemia in anterior and apical region. She underwent cath 09/04/2013 which showed chronically occluded RCA and triple vessel dx. CT surgery consulted but not felt to be surgical candidate due to poor PFTs, suboptimal targets, comorbidities and shock. EF 22% by Myoview. 30-35% by echo  Underwent placement of swan and IABP for shock (co-ox 38%). Unfortunately developed cold foot and IABP had to be removed.Underwent R femoral thrombectomy and 4 compartment fasciotomy of RLE on 6/1.On 6/3 had VT/VF->PEA arrest 10 mins CPR. U/s shows R axillary DVT  This am had severe pain in hip and back resolved with sodium phosphate infusion. Now off levophed. BP soft.  Continues on CVVHD but having to run even. CVP 10-13. Weight up 12 pounds from last week. Groin and leg wounds not healing well.   On dobutamine 6. On midodrine 10 tid. Denies SOB or dizziness. Co-ox 49%     CURRENT MEDS . aspirin  81 mg Oral Daily  . atorvastatin  80 mg Oral q1800  . calcitRIOL  0.25 mcg Oral Daily  . darbepoetin (ARANESP) injection - NON-DIALYSIS  200 mcg Subcutaneous Q Wed-1800  . feeding supplement (NEPRO CARB STEADY)  237 mL Oral TID WC  . fludrocortisone  0.1 mg Oral Daily  . insulin aspart  0-15 Units Subcutaneous TID WC  . insulin aspart  0-5 Units Subcutaneous QHS  . insulin aspart  3 Units Subcutaneous TID WC  . insulin glargine  40 Units Subcutaneous Q24H  . lidocaine (PF)  5 mL Other Once  . meropenem (MERREM) IV  1 g Intravenous Q12H  . midodrine  10 mg Oral TID WC  . pantoprazole  40 mg Oral Daily  . polyethylene glycol  17 g Oral Daily  . vancomycin  750 mg Intravenous Q24H    OBJECTIVE  Filed Vitals:   10/06/13 0500 10/06/13 0530 10/06/13 0600 10/06/13 0630  BP:  113/56 105/65 103/65 109/69  Pulse: 113 111 116 114  Temp:      TempSrc:      Resp: 20 12 16 10   Height:      Weight:      SpO2: 100% 100% 100% 100%    Intake/Output Summary (Last 24 hours) at 10/06/13 0702 Last data filed at 10/06/13 0600  Gross per 24 hour  Intake 2447.72 ml  Output   2642 ml  Net -194.28 ml   Filed Weights   10/03/13 0500 10/04/13 0500 10/05/13 0500  Weight: 113.6 kg (250 lb 7.1 oz) 113.9 kg (251 lb 1.7 oz) 117.2 kg (258 lb 6.1 oz)    PHYSICAL EXAM  CVP 14 General:Lying in bed.  NAD Neuro: Alert and oriented X 3. Moves all extremities spontaneous HEENT:  Normal  Neck: Supple without bruits. Chest L subclav PC. R subclav TLC. Unable to see JVD Lungs:  clear Heart: tachy regular. +s3 Abdomen: Soft, non-tender, non-distended, BS + x 4.  Extremities: 1+ edema. R foot. Warm viable. Weak pulses. RLE wound packed Wound vac in R groin  CBC  Recent Labs  10/05/13 0628 10/06/13 0405  WBC 6.2 6.4  HGB 6.7* 8.4*  HCT 22.5* 27.5*  MCV 97.0 94.5  PLT 253 355   Basic Metabolic Panel  Recent Labs  10/04/13 0500  10/05/13 0430 10/05/13 1700  NA 135*  < > 135* 135*  K 4.8  < > 4.8 4.9  CL 97  < > 98 96  CO2 26  < > 27 25  GLUCOSE 191*  < > 206* 257*  BUN 21  < > 22 23  CREATININE 2.35*  < > 2.05* 1.92*  CALCIUM 8.5  < > 8.3* 8.3*  MG 2.6*  --  2.6*  --   PHOS 2.8  < > 2.3 2.3  < > = values in this interval not displayed. Liver Function Tests  Recent Labs  10/05/13 0430 10/05/13 1700  ALBUMIN 1.9* 2.0*   Cardiac Enzymes No results found for this basename: CKTOTAL, CKMB, CKMBINDEX, TROPONINI,  in the last 72 hours Thyroid Function Tests No results found for this basename: TSH, T4TOTAL, FREET3, T3FREE, THYROIDAB,  in the last 72 hours  TELE  Sinus tachycardia 100-110   Radiology/Studies  Dg Chest 2 View  09/06/2013   CLINICAL DATA:  CHF, intermittent dyspnea  EXAM: CHEST  2 VIEW  COMPARISON:  Prior chest x-ray 08/13/2013; prior chest  CT 08/22/2013  FINDINGS: Stable cardiac and mediastinal contours. Atherosclerotic calcifications are present within the transverse aorta. Moderate left and small right pleural effusions are similar compared to prior. There is persistent associated bibasilar atelectasis. Mild pulmonary vascular congestion without overt edema. No pneumothorax. No new focal airspace consolidation. No acute osseous abnormality.  IMPRESSION: 1. Stable moderate left and small right pleural effusions and associated bibasilar atelectasis. 2. Pulmonary vascular congestion without overt edema.   Electronically Signed   By: Jacqulynn Cadet M.D.   On: 09/06/2013 07:55   Ct Chest Wo Contrast  08/22/2013   CLINICAL DATA:  Left lung cancer status post lobectomy. Weight gain. Renal insufficiency.  EXAM: CT CHEST WITHOUT CONTRAST  TECHNIQUE: Multidetector CT imaging of the chest was performed following the standard protocol without IV contrast.  COMPARISON:  Radiographs 08/13/2013 and 08/04/2013.  CT 08/02/2012.  FINDINGS: There are stable postsurgical changes related to prior left lower lobe resection. There has been interval enlargement of several mediastinal lymph nodes. These include 12 mm right paratracheal (image 19), 13 mm precarinal (image 23), and 14 mm AP window (image 25) lymph nodes. Some of these nodes have retained fatty hila. Allowing for the limitations of noncontrast technique, the hila appear stable.  There is stable low-density within the right thyroid lobe. Atherosclerosis of the aorta, great vessels and coronary arteries is noted. The heart size is normal. There is no pericardial effusion.  Moderate size dependent pleural effusions are present bilaterally. There is associated dependent airspace disease in both lower lobes. In addition, there are more focal airspace opacities within the right lower lobe and inferior aspect of the left upper lobe. The latter is somewhat nodular, measuring up to 1.8 cm on image 43. No other  focal nodularity or endobronchial lesions are demonstrated.  The visualized upper abdomen has a stable appearance. There is no adrenal mass. There is increased subcutaneous edema throughout the subcutaneous fat, especially within the anterior aspect of the upper abdomen.  No worrisome osseous findings are demonstrated.  IMPRESSION: 1. As demonstrated radiographically, there are bilateral pleural effusions and bilateral airspace opacities which are new compared with the prior CT. Although there are focal somewhat nodular components in the right lower and left upper lobes, these findings are most likely infectious/inflammatory. 2. Progressive mediastinal lymphadenopathy. Some of the nodes have retained fatty hila and may be reactive. Metastatic disease cannot be completely excluded. 3. Stable atherosclerosis  and low-density within the right thyroid lobe. 4. If the airspace opacities and pleural effusions fail to respond to appropriate clinical therapy, follow-up CT or PET-CT may be warranted to exclude metastatic disease.   Electronically Signed   By: Camie Patience M.D.   On: 08/22/2013 10:26   Dg Chest Portable 1 View  08/13/2013   CLINICAL DATA:  Shortness of breath increasing for 2 weeks. History of CHF.  EXAM: PORTABLE CHEST - 1 VIEW  COMPARISON:  08/04/2013  FINDINGS: Again noted are bibasilar lung densities that are suggestive for pleural effusions and atelectasis. Heart size appears to be enlarged. There is some peribronchial thickening and cannot exclude pulmonary edema.  IMPRESSION: Bilateral pleural effusions with basilar atelectasis. There is mild pulmonary edema. Minimal change from the previous examination.   Electronically Signed   By: Markus Daft M.D.   On: 08/13/2013 23:10    ASSESSMENT AND PLAN 1. Cardiogenic shock 2. Acute on chronic systolic HF     EF ~98-11% 3. 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 Acute respiratory failure 5. 3-V CAD 6. Carotid stenosis     - doppler 08/31/2013 shows R 60-79% ICA  stenosis, L 80-99% ICA stenosis     - vascular surgery aware 7. 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 DM1 9. Ischemic R foot s/p R femoral thrombectomy and 4-compartment fasciotomy.  10. VT/VF arrest 6/3 11. R axillary DVT 12. Severe protein calorie malnutrition  13. Large right groin wound s/p debridement 6/12   Levophed off but co-ox down again - in cardiogenic shock range despite dobutamine. Fluid re-accumulating. I think we have clearly reached a palliative situation as she will never be able to tolerate HD without at least 1 or 2 inotropes. We have exhausted all options. Also wounds healing very slowly. Will try to cut dobutamine down to 5 and repeat co-ox. If falling I think this will confirm our need for a palliative approach.  I will discuss with patient and family further. She has been resistant to palliative approach earlier in this hospitalization.   Mycolonus getting worse again despite switching zosyn to meropenem.   The patient is critically ill with multiple organ systems failure and requires high complexity decision making for assessment and support, frequent evaluation and titration of therapies, application of advanced monitoring technologies and extensive interpretation of multiple databases.   Critical Care Time devoted to patient care services described in this note is 40 Minutes.  Daniel Bensimhon,MD 7:02 AM

## 2013-10-06 NOTE — Progress Notes (Signed)
Subjective: Interval History: none.. Comfortable  Objective: Vital signs in last 24 hours: Temp:  [97.6 F (36.4 C)-98.4 F (36.9 C)] 97.7 F (36.5 C) (06/20 0750) Pulse Rate:  [41-123] 112 (06/20 0730) Resp:  [10-26] 13 (06/20 0730) BP: (83-154)/(19-133) 116/69 mmHg (06/20 0730) SpO2:  [95 %-100 %] 100 % (06/20 0730) Weight:  [259 lb 14.8 oz (117.9 kg)] 259 lb 14.8 oz (117.9 kg) (06/20 0700)  Intake/Output from previous day: 06/19 0701 - 06/20 0700 In: 2464.3 [P.O.:1050; I.V.:456; Blood:708.3; IV Piggyback:250] Out: 2663 [Drains:50] Intake/Output this shift: Total I/O In: -  Out: 20 [Other:20]  VAC in place without erythema.  Fasciotomy site clean  Lab Results:  Recent Labs  10/05/13 0628 10/06/13 0405  WBC 6.2 6.4  HGB 6.7* 8.4*  HCT 22.5* 27.5*  PLT 253 251   BMET  Recent Labs  10/05/13 1700 10/06/13 0500  NA 135* 136*  K 4.9 5.3  CL 96 100  CO2 25 26  GLUCOSE 257* 200*  BUN 23 22  CREATININE 1.92* 2.29*  CALCIUM 8.3* 8.4    Studies/Results: Dg Chest 1 View  09/14/2013   CLINICAL DATA:  Check aortic balloon pump  EXAM: CHEST - 1 VIEW  COMPARISON:  09/12/2013  FINDINGS: Cardiac shadow is stable. The left dialysis catheter is stable in the proximal superior vena cava. The right jugular temporary dialysis catheter has been removed. A new right subclavian sheath with Swan-Ganz catheter deep in the right pulmonary artery is noted. And intra-aortic balloon pump is noted superimposed over the aortic knob. The tip of the balloon pump extends beyond the room calcification of the thoracic aortic arch. Clinical correlation is recommended. Mild increased density is noted in the bases bilaterally but related to posteriorly of an effusion. Minimal left lower lobe atelectasis is seen.  IMPRESSION: Tubes and lines as described above. The tip of the balloon pump extends beyond in the margin of the room calcification in the thoracic aortic arch. Clinical correlation is  recommended.  Small bilateral pleural effusions as well as left retrocardiac atelectasis.   Electronically Signed   By: Inez Catalina M.D.   On: 09/14/2013 16:13   Dg Chest Port 1 View  09/20/2013   CLINICAL DATA:  Central line insertion.  EXAM: PORTABLE CHEST - 1 VIEW  COMPARISON:  09/19/2013.  FINDINGS: Right subclavian central line tip projects over the SVC. Left IJ dialysis catheter tip projects over the lower SVC. Heart size stable. Mild diffuse interstitial prominence and indistinctness with overall improvement in aeration from 09/19/2013. No pneumothorax. Small bilateral effusions.  IMPRESSION: 1. Right subclavian central line placement without complicating feature. 2. Improving congestive heart failure and left lower lobe collapse/consolidation.   Electronically Signed   By: Lorin Picket M.D.   On: 09/20/2013 16:57   Dg Chest Port 1 View  09/19/2013   CLINICAL DATA:  Evaluate endotracheal tube placement.  EXAM: PORTABLE CHEST - 1 VIEW  COMPARISON:  09/18/2013  FINDINGS: Endotracheal tube tip lies 3.7 cm above the chronic, well positioned. Nasogastric tube has been removed.  Left internal jugular dual-lumen tunneled central venous catheter is stable with its tip in the lower superior vena cava.  Vascular congestion and hazy pulmonary edema is similar to the previous day's study. Small pleural effusions, also unchanged.  No pneumothorax.  IMPRESSION: Endotracheal tube is well positioned. Persistent lung opacities most suggestive of pulmonary edema.   Electronically Signed   By: Lajean Manes M.D.   On: 09/19/2013 20:58   Dg Chest  Port 1 View  09/18/2013   CLINICAL DATA:  Check endotracheal tube following readjustment  EXAM: PORTABLE CHEST - 1 VIEW  COMPARISON:  09/17/2013  FINDINGS: Cardiac shadow is stable. An endotracheal tube and nasogastric catheter are again seen. The endotracheal tube now lie is 3.5 cm above the carina. Patchy infiltrative changes are again seen predominately within the right  lower lobe but stable from the prior exam. The left dialysis catheter is again identified and stable. A right-sided venous sheath is again seen and stable.  IMPRESSION: Interval enhancement of the endotracheal tube as described above.  The remainder of the exam is stable from the prior study.   Electronically Signed   By: Inez Catalina M.D.   On: 09/18/2013 11:05   Dg Chest Port 1 View  09/17/2013   CLINICAL DATA:  ETT placement  EXAM: PORTABLE CHEST - 1 VIEW  COMPARISON:  Chest radiograph 09/16/2013.  FINDINGS: Low lung volumes. Cardiac silhouette is enlarged. Endotracheal tube tip 7.6 cm above the carina projecting at the level of the clavicles. The patient's Swan-Ganz catheter has been removed. The left-sided central venous catheter stable. NG tube is appreciated tip not viewed on this study. Persistent left lower lobe consolidation. Decreased conspicuity of the right lower lobe density. There is prominence of the interstitial markings. No acute osseous abnormalities.  IMPRESSION: Endotracheal tube adequately positioned. Interval insertion of an NG tube and removal of the patient's Swan-Ganz catheter. The left-sided dialysis catheter stable.  Improved pulmonary edema.  Persistent left lower lobe density   Electronically Signed   By: Margaree Mackintosh M.D.   On: 09/17/2013 20:40   Dg Chest Port 1 View  09/16/2013   CLINICAL DATA:  Swan-Ganz position  EXAM: PORTABLE CHEST - 1 VIEW  COMPARISON:  Multiple prior studies  FINDINGS: There is a Swan-Ganz central venous line with tip in the distal right main pulmonary artery in the region of the right hilum. There is extensive right middle lobe consolidation. There is left lower lobe consolidation.  IMPRESSION: Swan-Ganz central line projects over the right hilum.   Electronically Signed   By: Skipper Cliche M.D.   On: 09/16/2013 15:31   Dg Chest Port 1 View  09/12/2013   CLINICAL DATA:  Dialysis catheter placement.  EXAM: PORTABLE CHEST - 1 VIEW  COMPARISON:   09/10/2013  FINDINGS: There has been interval placement of a left jugular dual-lumen central venous catheter with tips overlying the lower SVC. Right jugular central venous catheter remains in place with tip overlying the mid to lower SVC. The cardiac silhouette remains enlarged, unchanged. Pulmonary edema does not appear significantly changed. Veiling opacities in both lung bases are again seen, compatible with small bilateral pleural effusions. The patient has taken a greater inspiration, and there is mildly improved aeration of the lung bases. No pneumothorax is identified.  IMPRESSION: 1. Interval dialysis catheter placement as above. 2. Unchanged pulmonary edema and small bilateral pleural effusions. 3. Bibasilar atelectasis, improved from prior.   Electronically Signed   By: Logan Bores   On: 09/12/2013 13:11   Dg Chest Port 1 View  09/10/2013   CLINICAL DATA:  Congestive heart failure.  EXAM: PORTABLE CHEST - 1 VIEW  COMPARISON:  09/06/2013.  FINDINGS: 0744 hr. Right IJ central venous catheter tip terminates at the lower SVC level. The heart size and mediastinal contours are stable. Pulmonary edema, bibasilar airspace opacities and bilateral pleural effusions are unchanged. There is no evidence of pneumothorax. Telemetry leads overlie the chest.  IMPRESSION: No significant change in edema, bilateral pleural effusions and bibasilar atelectasis.   Electronically Signed   By: Camie Patience M.D.   On: 09/10/2013 09:31   Dg Chest Port 1 View  09/06/2013   CLINICAL DATA:  Right-sided central venous catheter placement.  EXAM: PORTABLE CHEST - 1 VIEW  COMPARISON:  Chest x-ray 09/06/2013.  FINDINGS: Interval removal of previously noted right IJ catheter and replacement with a right IJ Vas-Cath, with tip terminating in the distal superior vena cava. No pneumothorax. Lung volumes are low. Extensive bibasilar opacities (left greater than right) may reflect areas of atelectasis and/or consolidation. There are  superimposed small right and small to moderate left pleural effusions. Cephalization of the pulmonary vasculature, with indistinctness of the interstitial markings, suggesting a background of interstitial pulmonary edema. Mild cardiomegaly. Upper mediastinal contours are within normal limits. Atherosclerosis in the thoracic aorta.  IMPRESSION: 1. Tip of new right IJ Vas-Cath is in the distal superior vena cava. No pneumothorax. 2. The appearance of the chest suggests a background of congestive heart failure. Bibasilar opacities may simply reflect atelectasis, with superimposed pleural effusions, however, underlying airspace consolidation from infection or aspiration is not excluded.   Electronically Signed   By: Vinnie Langton M.D.   On: 09/06/2013 21:40   Dg Chest Port 1 View  09/06/2013   CLINICAL DATA:  Central catheter placement  EXAM: PORTABLE CHEST - 1 VIEW  COMPARISON:  Sep 06, 2013  FINDINGS: Central catheter tip is at the cavoatrial junction. No pneumothorax. There are bilateral effusions with bibasilar edema and cardiomegaly. There is mild pulmonary venous hypertension. There is consolidation in the bases, more on the left than on the right.  IMPRESSION: Central catheter tip at cavoatrial junction. No pneumothorax. Congestive heart failure. Cannot exclude superimposed pneumonia in the bases, particularly on the left.   Electronically Signed   By: Lowella Grip M.D.   On: 09/06/2013 18:34   Dg Fluoro Guide Cv Line-no Report  09/12/2013   CLINICAL DATA: dialysis catheter insertion   FLOURO GUIDE CV LINE  Fluoroscopy was utilized by the requesting physician.  No radiographic  interpretation.    Anti-infectives: Anti-infectives   Start     Dose/Rate Route Frequency Ordered Stop   10/05/13 1800  meropenem (MERREM) 1 g in sodium chloride 0.9 % 100 mL IVPB     1 g 200 mL/hr over 30 Minutes Intravenous Every 12 hours 10/05/13 1034     10/04/13 2200  vancomycin (VANCOCIN) IVPB 750 mg/150 ml  premix     750 mg 150 mL/hr over 60 Minutes Intravenous Every 24 hours 10/04/13 1438     10/03/13 1400  piperacillin-tazobactam (ZOSYN) IVPB 2.25 g  Status:  Discontinued     2.25 g 100 mL/hr over 30 Minutes Intravenous 3 times per day 10/03/13 0930 10/05/13 0837   09/30/13 0600  piperacillin-tazobactam (ZOSYN) IVPB 3.375 g  Status:  Discontinued     3.375 g 12.5 mL/hr over 240 Minutes Intravenous 3 times per day 09/29/13 2246 10/03/13 0930   09/29/13 1400  vancomycin (VANCOCIN) IVPB 1000 mg/200 mL premix  Status:  Discontinued     1,000 mg 200 mL/hr over 60 Minutes Intravenous Every 24 hours 09/29/13 1348 10/04/13 1438   09/29/13 0600  piperacillin-tazobactam (ZOSYN) IVPB 3.375 g  Status:  Discontinued     3.375 g 100 mL/hr over 30 Minutes Intravenous 3 times per day 09/28/13 1940 09/29/13 2246   09/27/13 1200  vancomycin (VANCOCIN) 1,250 mg in sodium chloride  0.9 % 250 mL IVPB  Status:  Discontinued     1,250 mg 166.7 mL/hr over 90 Minutes Intravenous Every 24 hours 09/26/13 1033 09/29/13 1348   09/26/13 1200  piperacillin-tazobactam (ZOSYN) IVPB 3.375 g  Status:  Discontinued     3.375 g 100 mL/hr over 30 Minutes Intravenous 4 times per day 09/26/13 1033 09/28/13 1940   09/26/13 1200  vancomycin (VANCOCIN) 2,000 mg in sodium chloride 0.9 % 500 mL IVPB     2,000 mg 250 mL/hr over 120 Minutes Intravenous  Once 09/26/13 1033 09/26/13 1319   09/18/13 1200  cefUROXime (ZINACEF) 1.5 g in dextrose 5 % 50 mL IVPB     1.5 g 100 mL/hr over 30 Minutes Intravenous Every 24 hours 09/17/13 1840 09/18/13 1200   09/18/13 0100  cefUROXime (ZINACEF) 1.5 g in dextrose 5 % 50 mL IVPB  Status:  Discontinued     1.5 g 100 mL/hr over 30 Minutes Intravenous Every 12 hours 09/17/13 1749 09/17/13 1840   09/17/13 1330  cefUROXime (ZINACEF) 1.5 g in dextrose 5 % 50 mL IVPB     1.5 g 100 mL/hr over 30 Minutes Intravenous To Surgery 09/17/13 1317 09/17/13 1348   09/12/13 0000  ceFAZolin (ANCEF) IVPB 1 g/50 mL  premix    Comments:  Send with pt to OR   1 g 100 mL/hr over 30 Minutes Intravenous On call 09/11/13 1055 09/12/13 0100      Assessment/Plan: s/p Procedure(s): IRRIGATION AND DEBRIDEMENT EXTREMITY (Right) APPLICATION OF WOUND VAC (Right) Stable.  OK to resume coumadin from vasc standpoint   LOS: 33 days   EARLY, TODD 10/06/2013, 8:33 AM

## 2013-10-06 NOTE — Progress Notes (Signed)
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 Was undergoing pre-op eval for AVF - abnl stress test ->, ischemic CM with 3V CAD by cath and not a CABG candidate. Developed cardiogenic shock, cold foot after IABP requiring femoral thrombectomy and fasciotomy (6/1), VT/VF then PEA arrest 6/3. She required initiation of RRT 5/21 and has remained dialysis dependent.  Short trial of IHD unsuccessful and back to CRRT as of 6/4 with continued low dose pressor + inotrope requirement and inability to wean both.  Assessment/Recommendations  1. Dialysis dependent anuric AoCKD5, now ESRD, cardiogenic shock, cont CRRT (Dialysis dependent since 5/21; current access is left TDC placed 5/27;  5/21 CRRT->IHD 5/26 ->back to CRRT 6/4 ; weight down 131->nadir of 113.6 with CRRT->allowed to drift back up some to see if could maintain BP. Have been keeping volume even past 48 hours. Will continue to do so.  As previously stated will have to be able to maintain BP off systemic pressors and inotropes in order to transition to intermittent HD. Like Dr. Jeffie Pollock, I am not sure this will be possible... With current open groin wound, placement of PD catheter would carry great risk of infection due to proximity of the groin wound so possibility MAYBE after wound healed/infection cleared... 2. Hypophosphatemia - s/p 10 mmoles IV 6/15. hos remains stable without further IV replacement since that time 3. Cardiogenic shock,  Ischemic and not a CABG candidate; EF 22% by myoview; 30-35 by echo as per #1; on NE/dobutamine,midodrine; NE down to 1.5.  Dr. 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 S/p R femoral endarterectomy wound complications, thrombectomy , and fasciotomy - wound VAC in place; notes reviewed. On vanco and zosyn with VAC changes per  [facility]. Changed from zosyn to meropenem. 6. CAD, ischemic CM 7. Anemia - on darbe 200/week; not at goal; iron studies appear "adequate" ; s/p 2 units for Hb of 6.9 (6/19) 8. Twitching/myoclonus - ATB changed zosyn->meropenem and is clearly better 9. Lung CA with prior LL Lobectomy 10. Right axillary DVT 11. Bilateral carotid stenosis 12. Diabetes - insulin  Subjective: Twitching better since D/C of zosyn NE off nearly 3 hours with SBP still above 100 No pain except in groin or SOB  Objective: Vital signs in last 24 hours: Temp:  [97.6 F (36.4 C)-98.4 F (36.9 C)] 97.7 F (36.5 C) (06/20 0750) Pulse Rate:  [96-135] 110 (06/20 1100) Resp:  [10-26] 10 (06/20 1100) BP: (83-154)/(19-133) 100/55 mmHg (06/20 1100) SpO2:  [96 %-100 %] 97 % (06/20 1100) Weight:  [117.9 kg (259 lb 14.8 oz)] 117.9 kg (259 lb 14.8 oz) (06/20 0700) Weight change: 0.7 kg (1 lb 8.7 oz)  Current CRRT Prescription:  Start Date: restarted 09/20/13  Catheter: TDC left BFR: 200  Pre Blood Pump: 300 DFR: 1500  Replacement Rate: 200  Goal UF      Volume even (since 6/16)     + 25-50/hour 6/18     Volume event 6/19 Anticoagulation: argatroban systemic, clotted on heparin, +HIT  Clotting: not excessive  WEIGHT TRENDING 10/06/13 0700 117.9 kg 10/05/13 0500 117.2 kg (repeated - same) 10/04/13 0500 113.9 kg 10/03/13 0500 113.6 kg  10/02/13 0500 114.5 kg  10/01/13 0648 114.6 kg  09/30/13 0500 117.6 kg  09/29/13 0600 121.7 kg  09/28/13 0600 123.8 kg 09/27/13 0500  127.2 kg  09/26/13 0500 130.9 kg  09/25/13 0500 133.5 kg 09/24/13 0600 134.5 kg  09/23/13 0700 132.8 kg  09/22/13 0500 131.1 kg  09/21/13 0500 129.6 kg  09/20/13 0500 131 kg   CRRT restarted  PHYSICAL EXAMINATION General appearance: alert and cooperative, sitting up in bed, NAD BP 100/55  Pulse 110  Temp(Src) 97.7 F (36.5 C) (Oral)  Resp 10  Ht 5' 7.5" (1.715 m)  Wt 117.9 kg (259 lb 14.8 oz)  BMI 40.09 kg/m2  SpO2 97%  LMP  08/19/2013 CVP 12 -15 No twitching Alert,oriented, pleasant   Central line R and TDC L Resp: clear to auscultation bilaterally anteriorly   TDC dressing dry and intact Cardio: regular rate and rhythm, tachy at 113 S1, S2 normal, no murmur, click, rub or gallop GI: soft, non-tender; bowel sounds normal Extremities: 1+ edema to thighs Wound VAC in place R Groin; stapled incisions clean and dry on LE's Dusky tips of toes right foot #3-5 unchanged  Intake/Output from previous day: 06/14 0701 - 06/15 0700 In: 998.1 [I.V.:623.1; IV Piggyback:375] Out: 5433 [Drains:100]  Recent Labs  10/05/13 0430 10/05/13 1700 10/06/13 0405 10/06/13 0500  NA 135* 135*  --  136*  K 4.8 4.9  --  5.3  CL 98 96  --  100  CO2 27 25  --  26  GLUCOSE 206* 257*  --  200*  BUN 22 23  --  22  CREATININE 2.05* 1.92*  --  2.29*  CALCIUM 8.3* 8.3*  --  8.4  MG 2.6*  --  2.5  --   PHOS 2.3 2.3  --  2.6    Recent Labs  10/05/13 1700 10/06/13 0500  ALBUMIN 2.0* 2.0*    Recent Labs  10/05/13 0628 10/06/13 0405  WBC 6.2 6.4  HGB 6.7* 8.4*  HCT 22.5* 27.5*  MCV 97.0 94.5  PLT 253 251   Results for Hannah [name], Hannah [name] (MRN [id number]) as of 10/02/2013 09:13  Ref. Range 10/01/2013 08:30  Iron Latest Range: 42-135 ug/dL 45  UIBC Latest Range: 125-400 ug/dL 164  TIBC Latest Range: 250-470 ug/dL 209 (L)  Saturation Ratios Latest Range: 20-55 % 22  Ferritin Latest Range: 10-291 ng/mL 399 (H)    Scheduled Medications: . aspirin  81 mg Oral Daily  . atorvastatin  80 mg Oral q1800  . calcitRIOL  0.25 mcg Oral Daily  . darbepoetin (ARANESP) injection - NON-DIALYSIS  200 mcg Subcutaneous Q Wed-1800  . feeding supplement (NEPRO CARB STEADY)  237 mL Oral TID WC  . fludrocortisone  0.1 mg Oral Daily  . insulin aspart  0-15 Units Subcutaneous TID WC  . insulin aspart  0-5 Units Subcutaneous QHS  . insulin aspart  6 Units Subcutaneous TID WC  . insulin glargine  40 Units Subcutaneous Q24H  . lidocaine (PF)   5 mL Other Once  . meropenem (MERREM) IV  1 g Intravenous Q12H  . midodrine  10 mg Oral TID WC  . pantoprazole  40 mg Oral Daily  . polyethylene glycol  17 g Oral Daily  . vancomycin  750 mg Intravenous Q24H  . warfarin  5 mg Oral ONCE-1800  . Warfarin - Pharmacist Dosing Inpatient   Does not apply q1800  Infusion Medicications . sodium chloride Stopped (10/01/13 2000)  . argatroban 0.5 mcg/kg/min (10/06/13 1100)  . DOBUTamine 6.001 mcg/kg/min (10/06/13 1100)  . norepinephrine (LEVOPHED) Adult infusion Stopped (10/06/13 0900)  . dialysis replacement fluid (prismasate) 300 mL/hr at 10/05/13 1724  .  dialysis replacement fluid (prismasate) 200 mL/hr at 10/06/13 0325  . dialysate (PRISMASATE) 1,500 mL/hr at 10/06/13 0324    LOS: 33 days   [name],Hannah B 10/06/2013,11:58 AM

## 2013-10-07 DIAGNOSIS — I999 Unspecified disorder of circulatory system: Secondary | ICD-10-CM

## 2013-10-07 DIAGNOSIS — I209 Angina pectoris, unspecified: Secondary | ICD-10-CM

## 2013-10-07 DIAGNOSIS — D75829 Heparin-induced thrombocytopenia, unspecified: Secondary | ICD-10-CM

## 2013-10-07 DIAGNOSIS — D7582 Heparin induced thrombocytopenia (HIT): Secondary | ICD-10-CM

## 2013-10-07 LAB — CBC
HCT: 26.1 % — ABNORMAL LOW (ref 36.0–46.0)
Hemoglobin: 7.9 g/dL — ABNORMAL LOW (ref 12.0–15.0)
MCH: 29.2 pg (ref 26.0–34.0)
MCHC: 30.3 g/dL (ref 30.0–36.0)
MCV: 96.3 fL (ref 78.0–100.0)
PLATELETS: 244 10*3/uL (ref 150–400)
RBC: 2.71 MIL/uL — ABNORMAL LOW (ref 3.87–5.11)
RDW: 18.2 % — ABNORMAL HIGH (ref 11.5–15.5)
WBC: 6.2 10*3/uL (ref 4.0–10.5)

## 2013-10-07 LAB — RENAL FUNCTION PANEL
ALBUMIN: 1.8 g/dL — AB (ref 3.5–5.2)
Albumin: 1.7 g/dL — ABNORMAL LOW (ref 3.5–5.2)
BUN: 22 mg/dL (ref 6–23)
BUN: 18 mg/dL (ref 6–23)
CALCIUM: 7.8 mg/dL — AB (ref 8.4–10.5)
CO2: 26 mEq/L (ref 19–32)
CO2: 25 mEq/L (ref 19–32)
CREATININE: 1.82 mg/dL — AB (ref 0.50–1.10)
Calcium: 8.1 mg/dL — ABNORMAL LOW (ref 8.4–10.5)
Chloride: 101 mEq/L (ref 96–112)
Chloride: 100 mEq/L (ref 96–112)
Creatinine, Ser: 2.13 mg/dL — ABNORMAL HIGH (ref 0.50–1.10)
GFR calc Af Amer: 38 mL/min — ABNORMAL LOW (ref >90)
GFR, EST AFRICAN AMERICAN: 31 mL/min — AB (ref >90)
GFR, EST NON AFRICAN AMERICAN: 27 mL/min — AB (ref >90)
GFR, EST NON AFRICAN AMERICAN: 32 mL/min — AB (ref >90)
Glucose, Bld: 124 mg/dL — ABNORMAL HIGH (ref 70–99)
Glucose, Bld: 91 mg/dL (ref 70–99)
POTASSIUM: 4.6 meq/L (ref 3.7–5.3)
Phosphorus: 2.1 mg/dL — ABNORMAL LOW (ref 2.3–4.6)
Phosphorus: 1.8 mg/dL — ABNORMAL LOW (ref 2.3–4.6)
Potassium: 4.5 mEq/L (ref 3.7–5.3)
SODIUM: 137 meq/L (ref 137–147)
Sodium: 135 mEq/L — ABNORMAL LOW (ref 137–147)

## 2013-10-07 LAB — GLUCOSE, CAPILLARY
GLUCOSE-CAPILLARY: 74 mg/dL (ref 70–99)
Glucose-Capillary: 148 mg/dL — ABNORMAL HIGH (ref 70–99)
Glucose-Capillary: 100 mg/dL — ABNORMAL HIGH (ref 70–99)
Glucose-Capillary: 69 mg/dL — ABNORMAL LOW (ref 70–99)
Glucose-Capillary: 185 mg/dL — ABNORMAL HIGH (ref 70–99)

## 2013-10-07 LAB — PROTIME-INR
INR: 1.77 — AB (ref 0.00–1.49)
PROTHROMBIN TIME: 20.1 s — AB (ref 11.6–15.2)

## 2013-10-07 LAB — CARBOXYHEMOGLOBIN
Carboxyhemoglobin: 2.4 % — ABNORMAL HIGH (ref 0.5–1.5)
Methemoglobin: 0.7 % (ref 0.0–1.5)
O2 Saturation: 62.6 %
Total hemoglobin: 8 g/dL — ABNORMAL LOW (ref 12.0–16.0)

## 2013-10-07 LAB — MAGNESIUM: Magnesium: 2.4 mg/dL (ref 1.5–2.5)

## 2013-10-07 LAB — APTT: aPTT: 66 seconds — ABNORMAL HIGH (ref 24–37)

## 2013-10-07 MED ORDER — WARFARIN SODIUM 5 MG PO TABS
5.0000 mg | ORAL_TABLET | Freq: Once | ORAL | Status: AC
  Administered 2013-10-07: 5 mg via ORAL
  Filled 2013-10-07: qty 1

## 2013-10-07 NOTE — Progress Notes (Addendum)
Hypoglycemic Event  CBG: 69  Treatment: 15 GM carbohydrate snack  Symptoms: None  Follow-up CBG: Time:1745 CBG Result: 74  Possible Reasons for Event: Medication regimen: ? increase in lantus dose  Comments/MD notified:hypoglycemia protocol followed    Lorre Munroe  Remember to initiate Hypoglycemia Order Set & complete

## 2013-10-07 NOTE — Progress Notes (Signed)
Patient ID: Hannah [name], female   DOB: 10/21/1970, 43 y.o.   MRN: [id number]  ADVANCED HEART FAILURE ROUNDING NOTE   SUBJECTIVE  43 yo female with PMH of DM1, stage V kidney disease, blindness, h/o lung CA s/p resection who was recently discharged for heart failure symptom had a high risk stress test which showed ischemia in anterior and apical region. She underwent cath 09/04/2013 which showed chronically occluded RCA and triple vessel dx. CT surgery consulted but not felt to be surgical candidate due to poor PFTs, suboptimal targets, comorbidities and shock. EF 22% by Myoview. 30-35% by echo  Underwent placement of swan and IABP for shock (co-ox 38%). Unfortunately developed cold foot and IABP had to be removed.Underwent R femoral thrombectomy and 4 compartment fasciotomy of RLE on 6/1.On 6/3 had VT/VF->PEA arrest 10 mins CPR. U/s shows R axillary DVT  Continues on CVVHD but attempting to run even currently. CVP 12-15. Weight up ?5 lbs.  She is off norepinephrine but remains on dobutamine @ 6. On midodrine 10 tid. Denies SOB or dizziness. Feels bloated. SBP 90s today with HR 110s ST.  Co-ox 62%.  Coumadin started yesterday.    CURRENT MEDS . aspirin  81 mg Oral Daily  . atorvastatin  80 mg Oral q1800  . calcitRIOL  0.25 mcg Oral Daily  . darbepoetin (ARANESP) injection - NON-DIALYSIS  200 mcg Subcutaneous Q Wed-1800  . feeding supplement (NEPRO CARB STEADY)  237 mL Oral TID WC  . fludrocortisone  0.1 mg Oral Daily  . insulin aspart  0-15 Units Subcutaneous TID WC  . insulin aspart  0-5 Units Subcutaneous QHS  . insulin aspart  6 Units Subcutaneous TID WC  . insulin glargine  40 Units Subcutaneous Q24H  . lidocaine (PF)  5 mL Other Once  . meropenem (MERREM) IV  1 g Intravenous Q12H  . midodrine  10 mg Oral TID WC  . pantoprazole  40 mg Oral Daily  . polyethylene glycol  17 g Oral Daily  . vancomycin  750 mg Intravenous Q24H  . Warfarin - Pharmacist Dosing Inpatient   Does not apply  q1800    OBJECTIVE  Filed Vitals:   10/07/13 0600 10/07/13 0630 10/07/13 0700 10/07/13 0729  BP: 89/69 97/55 90/51    Pulse: 112 109 112   Temp:    97.8 F (36.6 C)  TempSrc:    Oral  Resp: 21 13 10    Height:      Weight:  119.8 kg (264 lb 1.8 oz)    SpO2: 99% 99% 100%     Intake/Output Summary (Last 24 hours) at 10/07/13 0755 Last data filed at 10/07/13 0700  Gross per 24 hour  Intake 1376.8 ml  Output   1444 ml  Net  -67.2 ml   Filed Weights   10/05/13 0500 10/06/13 0700 10/07/13 0630  Weight: 117.2 kg (258 lb 6.1 oz) 117.9 kg (259 lb 14.8 oz) 119.8 kg (264 lb 1.8 oz)    PHYSICAL EXAM  CVP 14 General:Lying in bed.  NAD Neuro: Alert and oriented X 3. Moves all extremities spontaneous HEENT:  Normal  Neck: Supple without bruits. Chest L subclav PC. R subclav TLC. Unable to see JVD Lungs:  clear Heart: tachy regular. +s3 Abdomen: Soft, non-tender, non-distended, BS + x 4.  Extremities: 1+ edema bilateral ankles  CBC  Recent Labs  10/06/13 0405 10/07/13 0400  WBC 6.4 6.2  HGB 8.4* 7.9*  HCT 27.5* 26.1*  MCV 94.5 96.3  PLT 251  322   Basic Metabolic Panel  Recent Labs  10/06/13 0405  10/06/13 1552 10/07/13 0400  NA  --   < > 136* 137  K  --   < > 4.9 4.6  CL  --   < > 98 101  CO2  --   < > 27 26  GLUCOSE  --   < > 178* 124*  BUN  --   < > 22 22  CREATININE  --   < > 1.96* 2.13*  CALCIUM  --   < > 8.3* 8.1*  MG 2.5  --   --  2.4  PHOS  --   < > 2.0* 2.1*  < > = values in this interval not displayed. Liver Function Tests  Recent Labs  10/06/13 1552 10/07/13 0400  ALBUMIN 1.9* 1.8*   Cardiac Enzymes No results found for this basename: CKTOTAL, CKMB, CKMBINDEX, TROPONINI,  in the last 72 hours Thyroid Function Tests No results found for this basename: TSH, T4TOTAL, FREET3, T3FREE, THYROIDAB,  in the last 72 hours  TELE  Sinus tachycardia 100-110   Radiology/Studies  Dg Chest 2 View  09/06/2013   CLINICAL DATA:  CHF, intermittent  dyspnea  EXAM: CHEST  2 VIEW  COMPARISON:  Prior chest x-ray 08/13/2013; prior chest CT 08/22/2013  FINDINGS: Stable cardiac and mediastinal contours. Atherosclerotic calcifications are present within the transverse aorta. Moderate left and small right pleural effusions are similar compared to prior. There is persistent associated bibasilar atelectasis. Mild pulmonary vascular congestion without overt edema. No pneumothorax. No new focal airspace consolidation. No acute osseous abnormality.  IMPRESSION: 1. Stable moderate left and small right pleural effusions and associated bibasilar atelectasis. 2. Pulmonary vascular congestion without overt edema.   Electronically Signed   By: Jacqulynn Cadet M.D.   On: 09/06/2013 07:55   Ct Chest Wo Contrast  08/22/2013   CLINICAL DATA:  Left lung cancer status post lobectomy. Weight gain. Renal insufficiency.  EXAM: CT CHEST WITHOUT CONTRAST  TECHNIQUE: Multidetector CT imaging of the chest was performed following the standard protocol without IV contrast.  COMPARISON:  Radiographs 08/13/2013 and 08/04/2013.  CT 08/02/2012.  FINDINGS: There are stable postsurgical changes related to prior left lower lobe resection. There has been interval enlargement of several mediastinal lymph nodes. These include 12 mm right paratracheal (image 19), 13 mm precarinal (image 23), and 14 mm AP window (image 25) lymph nodes. Some of these nodes have retained fatty hila. Allowing for the limitations of noncontrast technique, the hila appear stable.  There is stable low-density within the right thyroid lobe. Atherosclerosis of the aorta, great vessels and coronary arteries is noted. The heart size is normal. There is no pericardial effusion.  Moderate size dependent pleural effusions are present bilaterally. There is associated dependent airspace disease in both lower lobes. In addition, there are more focal airspace opacities within the right lower lobe and inferior aspect of the left upper  lobe. The latter is somewhat nodular, measuring up to 1.8 cm on image 43. No other focal nodularity or endobronchial lesions are demonstrated.  The visualized upper abdomen has a stable appearance. There is no adrenal mass. There is increased subcutaneous edema throughout the subcutaneous fat, especially within the anterior aspect of the upper abdomen.  No worrisome osseous findings are demonstrated.  IMPRESSION: 1. As demonstrated radiographically, there are bilateral pleural effusions and bilateral airspace opacities which are new compared with the prior CT. Although there are focal somewhat nodular components in the right  lower and left upper lobes, these findings are most likely infectious/inflammatory. 2. Progressive mediastinal lymphadenopathy. Some of the nodes have retained fatty hila and may be reactive. Metastatic disease cannot be completely excluded. 3. Stable atherosclerosis and low-density within the right thyroid lobe. 4. If the airspace opacities and pleural effusions fail to respond to appropriate clinical therapy, follow-up CT or PET-CT may be warranted to exclude metastatic disease.   Electronically Signed   By: Camie Patience M.D.   On: 08/22/2013 10:26   Dg Chest Portable 1 View  08/13/2013   CLINICAL DATA:  Shortness of breath increasing for 2 weeks. History of CHF.  EXAM: PORTABLE CHEST - 1 VIEW  COMPARISON:  08/04/2013  FINDINGS: Again noted are bibasilar lung densities that are suggestive for pleural effusions and atelectasis. Heart size appears to be enlarged. There is some peribronchial thickening and cannot exclude pulmonary edema.  IMPRESSION: Bilateral pleural effusions with basilar atelectasis. There is mild pulmonary edema. Minimal change from the previous examination.   Electronically Signed   By: Markus Daft M.D.   On: 08/13/2013 23:10    ASSESSMENT AND PLAN 1. Cardiogenic shock 2. Acute on chronic systolic HF     EF ~00-17% 3. 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 Acute respiratory  failure 5. 3-V CAD, severe.  6. Carotid stenosis     - doppler 08/31/2013 shows R 60-79% ICA stenosis, L 80-99% ICA stenosis     - vascular surgery aware 7. 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 DM1 9. Ischemic R foot s/p R femoral thrombectomy and 4-compartment fasciotomy.  10. VT/VF arrest 6/3 11. R axillary DVT 12. Severe protein calorie malnutrition  13. Large right groin wound s/p debridement 6/12 14. Anemia: Transfuse hemoglobin < 7.  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 Would consider trying to pull off some fluid via CVVH today and tolerate SBP down to 80.  Will hopefully start trying to titrate down dobutamine tomorrow.   I am increasingly concerned that we will not be able to liberate her from inotropes to permit HD (she needs to be off dobutamine). Renal feels placement of PD catheter not possible at this time due to risk of infection from groin wound. Co-ox ok today.  Continue midodrine with addition of low-dose Florinef in hopes of some assistance in increasing vascular tone. Recent cortisol level was ok.   Continue medical management of CAD. Long-term management of HF is problematic as she cannot tolerate any HF meds.   Wound management per [facility] and Wound Care. Continue abx for now. She has been started on coumadin, will need overlap with argatroban given HIT.   Myoclonus improving. Zosyn switched to meropenem  Dalton McLean,MD 7:55 AM

## 2013-10-07 NOTE — Progress Notes (Signed)
NEURO HOSPITALIST PROGRESS NOTE   SUBJECTIVE:                                                                                                                        No new neurological developments. Stated that twitching movements are getting better day by day. Off zosyn.   OBJECTIVE:                                                                                                                           Vital signs in last 24 hours: Temp:  [97.8 F (36.6 C)-98.4 F (36.9 C)] 97.8 F (36.6 C) (06/21 0729) Pulse Rate:  [80-126] 112 (06/21 0800) Resp:  [10-27] 21 (06/21 0800) BP: (80-127)/(41-88) 98/59 mmHg (06/21 0800) SpO2:  [91 %-100 %] 100 % (06/21 0800) Weight:  [119.8 kg (264 lb 1.8 oz)] 119.8 kg (264 lb 1.8 oz) (06/21 0630)  Intake/Output from previous day: 06/20 0701 - 06/21 0700 In: 1376.8 [P.O.:650; I.V.:376.8; IV Piggyback:350] Out: 7591 [Drains:25] Intake/Output this shift: Total I/O In: 15.7 [I.V.:15.7] Out: 71 [Drains:50; Other:21] Nutritional status: Diabetic  Past Medical History  Diagnosis Date  . High cholesterol   . Diabetic retinopathy   . Peripheral neuropathy     "tips of toes"  . Blind right eye   . CHF (congestive heart failure)   . CAD (coronary artery disease)   . GERD (gastroesophageal reflux disease)   . Hypertension   . History of lung cancer 07/2011    s/p left lower lobectomy  . Diabetes mellitus     IDDM  . Cataract of right eye   . Ulcer of toe of right foot 07/10/2012    great toe  . Breast calcification, left 06/2012  . Acute biphenotypic leukemia   . CKD (chronic kidney disease) stage 5, GFR less than 15 ml/min   . Nephrotic syndrome   . Gastritis     H/o gastritis on prior endoscopy  . Anemia   . Carotid artery disease     Neurologic Exam:  Mental Status:  Alert, oriented, thought content appropriate. Speech fluent without evidence of aphasia. Able to follow 3 step commands without  difficulty.  Cranial Nerves:  II: Discs flat bilaterally; blind in right eye  but able to see on all fields when looking with left eye, left pupil, round, reactive to light right pupil is noncreative.  III,IV, VI: ptosis not present, extra-ocular motions intact bilaterally with the right eye deviated laterally at rest and more pronounced deviation laterally with upward gaze.  V,VII: smile symmetric, facial light touch sensation normal bilaterally  VIII: hearing normal bilaterally  IX,X: gag reflex present  XI: bilateral shoulder shrug  XII: midline tongue extension without atrophy or fasciculations  Motor:  Moves all limbs spontaneously and symmetrically.  Sensory: Pinprick and light touch intact throughout, bilaterally with stocking distribution neuropathy  Deep Tendon Reflexes:  Right: Upper Extremity Left: Upper extremity  biceps (C-5 to C-6) 2/4 biceps (C-5 to C-6) 2/4  tricep (C7) 2/4 triceps (C7) 2/4  Brachioradialis (C6) 2/4 Brachioradialis (C6) 2/4  Lower Extremity Lower Extremity  quadriceps (L-2 to L-4) 0/4 quadriceps (L-2 to L-4) 0/4  Achilles (S1) 0/4 Achilles (S1) 0/4  Plantars:  Right: downgoing Left: downgoing  Cerebellar:  normal finger-to-nose, normal heel-to-shin test  Gait: not tested   Lab Results: Lab Results  Component Value Date/Time   CHOL 308* 11/22/2012  1:05 PM   Lipid Panel No results found for this basename: CHOL, TRIG, HDL, CHOLHDL, VLDL, LDLCALC,  in the last 72 hours  Studies/Results: Dg Chest Port 1 View  10/06/2013   CLINICAL DATA:  edema/effusion  EXAM: PORTABLE CHEST - 1 VIEW  COMPARISON:  Portal chest radiograph 09/20/2013  FINDINGS: Stable central venous catheters. Stable cardiomegaly. Persistent density in left lower lobe. Decrease conspicuity in interstitial findings. Linear density right lung base.  IMPRESSION: Improving pulmonary edema  Collapse versus consolidation left lung base stable  Atelectasis right lung base  Stable central venous  catheters.   Electronically Signed   By: Margaree Mackintosh M.D.   On: 10/06/2013 17:30    MEDICATIONS                                                                                                                        Scheduled: . aspirin  81 mg Oral Daily  . atorvastatin  80 mg Oral q1800  . calcitRIOL  0.25 mcg Oral Daily  . darbepoetin (ARANESP) injection - NON-DIALYSIS  200 mcg Subcutaneous Q Wed-1800  . feeding supplement (NEPRO CARB STEADY)  237 mL Oral TID WC  . fludrocortisone  0.1 mg Oral Daily  . insulin aspart  0-15 Units Subcutaneous TID WC  . insulin aspart  0-5 Units Subcutaneous QHS  . insulin aspart  6 Units Subcutaneous TID WC  . insulin glargine  40 Units Subcutaneous Q24H  . lidocaine (PF)  5 mL Other Once  . meropenem (MERREM) IV  1 g Intravenous Q12H  . midodrine  10 mg Oral TID WC  . pantoprazole  40 mg Oral Daily  . polyethylene glycol  17 g Oral Daily  . vancomycin  750 mg Intravenous Q24H  . Warfarin - Pharmacist Dosing Inpatient   Does not apply [phone]  ASSESSMENT/PLAN:                                                                                                            New onset myoclonus, most likely cortical in the context of underlying metabolic derangements or medication induced, gradually improving. Neurology will sign off. Please, call with questions, concerns, or new developments.  Dorian Pod, MD [facility] [phone]  10/07/2013, 9:02 AM

## 2013-10-07 NOTE — Progress Notes (Signed)
Background 43 yo WF with complex history of DM, advanced CKD 5 at baseline, h/o lung CA resection. Was undergoing pre-op eval for AVF - abnl stress test ->, ischemic CM with 3V CAD by cath and not a CABG candidate. Developed cardiogenic shock, cold foot after IABP requiring femoral thrombectomy and fasciotomy (6/1), VT/VF then PEA arrest 6/3. She required initiation of RRT 09/06/13 and has remained dialysis dependent.  Short trial of IHD unsuccessful and back to CRRT as of 6/4 with continued low dose pressor + inotrope requirement and inability to wean both.  Assessment/Recommendations  1. Dialysis dependent anuric AoCKD5, now ESRD, cardiogenic shock, cont CRRT (Dialysis dependent since 5/21; current access is left TDC placed 5/27;  5/21 CRRT->IHD 5/26 ->back to CRRT 6/4 ; weight down 131->nadir of 113.6 with CRRT->allowed to drift back up some to see if could maintain BP. Have been keeping volume past 72 hours but feeling bloated, weight up again now to 119.8 kg despite "keep even" status, so will try pulling 25-50/hour and cards wants BP above 80 systolic.  As previously stated will have to be able to maintain BP off systemic pressors and inotropes in order to transition to intermittent HD. Like Dr. Jeffie Pollock, I am not sure this will be possible... With current open groin wound, placement of PD catheter would carry great risk of infection due to proximity of the groin wound so possibility MAYBE after wound healed/infection cleared... 2. Hypophosphatemia - s/p 10 mmoles IV 6/15. hos remains stable above 2 without further IV replacement since that time 3. Cardiogenic shock, Ischemic CM  and not a CABG candidate; EF 22% by myoview; 30-35 by echo as per #1; on dobutamine,midodrine; NE off since yesterday  Dr. Jeffie Pollock added Florinef in hopes of some direct vascular effect aside from the mineralocorticoid effect on salt and water (which requires kidney function) 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 S/p R femoral  endarterectomy wound complications, thrombectomy , and fasciotomy - wound VAC in place; notes reviewed. On vanco and zosyn with VAC changes per [facility]. Changed from zosyn to meropenem. 6. CAD, ischemic CM 7. Anemia - on darbe 200/week; not at goal; iron studies appear "adequate" ; s/p 2 units for Hb of 6.9 (6/19) 8. Twitching/myoclonus - ATB changed zosyn->meropenem and is improving 9. Lung CA with prior LL Lobectomy 10. Right axillary DVT 11. Bilateral carotid stenosis 12. Diabetes - insulin  Subjective: Twitching still a little mouth and legs Not SOB; felt a little bloated earlier, not so much right now Despite "keep even" with CRRT, weight has increased 6 kg in the past 3 days Off pressors for 24 hours  Objective: Vital signs in last 24 hours: Temp:  [97.8 F (36.6 C)-98.4 F (36.9 C)] 97.8 F (36.6 C) (06/21 0729) Pulse Rate:  [98-120] 109 (06/21 1100) Resp:  [10-27] 16 (06/21 1200) BP: (80-131)/(41-112) 131/112 mmHg (06/21 1200) SpO2:  [91 %-100 %] 100 % (06/21 1200) Weight:  [119.8 kg (264 lb 1.8 oz)] 119.8 kg (264 lb 1.8 oz) (06/21 0630) Weight change: 1.9 kg (4 lb 3 oz)  Current CRRT Prescription:  Start Date: restarted 09/20/13  Catheter: TDC left BFR: 200  Pre Blood Pump: 300 DFR: 1500  Replacement Rate: 200  Goal UF      Volume even (since 6/16)     + 25-50/hour 6/18     Volume event 6/19 Anticoagulation: argatroban systemic, clotted on heparin, +HIT  Clotting: not excessive  WEIGHT TRENDING 10/07/13 0630 119.8 kg  10/06/13 0700 117.9 kg 10/05/13  0500 117.2 kg (repeated - same) 10/04/13 0500 113.9 kg CRRT changed to "keep volume even" fluid removal goal 10/03/13 0500 113.6 kg  10/02/13 0500 114.5 kg  10/01/13 0648 114.6 kg  09/30/13 0500 117.6 kg  09/29/13 0600 121.7 kg  09/28/13 0600 123.8 kg 09/27/13 0500 127.2 kg  09/26/13 0500 130.9 kg  09/25/13 0500 133.5 kg 09/24/13 0600 134.5 kg  09/23/13 0700 132.8 kg  09/22/13 0500 131.1 kg  09/21/13 0500 129.6  kg  09/20/13 0500 131 kg   CRRT restarted -50-100/hour  PHYSICAL EXAMINATION General appearance: alert and cooperative, sitting up in bed, NAD BP 131/112  Pulse 109  Temp(Src) 97.8 F (36.6 C) (Oral)  Resp 16  Ht 5' 7.5" (1.715 m)  Wt 119.8 kg (264 lb 1.8 oz)  BMI 40.73 kg/m2  SpO2 100%  LMP 08/19/2013 CVP 14 -15 Still some mouth twitching and she says some myclonic jerks in legs - overall better Alert,oriented, pleasant and up in the chair Central line R and TDC L Lungs clear to auscultation bilaterally anteriorly   TDC dressing dry and intact left chest Cardio: regular rate and rhythm S1, S2 normal, no murmur, click, rub or gallop GI: soft, non-tender; bowel sounds normal Extremities: 1+ edema to thighs Wound VAC in place R Groin; stapled incisions clean and dry on LE's Dusky tips of toes right foot #3-5 unchanged  Intake/Output from previous day: 06/14 0701 - 06/15 0700 In: 998.1 [I.V.:623.1; IV Piggyback:375] Out: 5433 [Drains:100]  Recent Labs  10/06/13 0405  10/06/13 1552 10/07/13 0400  NA  --   < > 136* 137  K  --   < > 4.9 4.6  CL  --   < > 98 101  CO2  --   < > 27 26  GLUCOSE  --   < > 178* 124*  BUN  --   < > 22 22  CREATININE  --   < > 1.96* 2.13*  CALCIUM  --   < > 8.3* 8.1*  MG 2.5  --   --  2.4  PHOS  --   < > 2.0* 2.1*  < > = values in this interval not displayed.  Recent Labs  10/06/13 1552 10/07/13 0400  ALBUMIN 1.9* 1.8*    Recent Labs  10/06/13 0405 10/07/13 0400  WBC 6.4 6.2  HGB 8.4* 7.9*  HCT 27.5* 26.1*  MCV 94.5 96.3  PLT 251 244   Results for EILA, RUNYAN (MRN [id number]) as of 10/02/2013 09:13  Ref. Range 10/01/2013 08:30  Iron Latest Range: 42-135 ug/dL 45  UIBC Latest Range: 125-400 ug/dL 164  TIBC Latest Range: 250-470 ug/dL 209 (L)  Saturation Ratios Latest Range: 20-55 % 22  Ferritin Latest Range: 10-291 ng/mL 399 (H)    Scheduled Medications: . aspirin  81 mg Oral Daily  . atorvastatin  80 mg Oral q1800  .  calcitRIOL  0.25 mcg Oral Daily  . darbepoetin (ARANESP) injection - NON-DIALYSIS  200 mcg Subcutaneous Q Wed-1800  . feeding supplement (NEPRO CARB STEADY)  237 mL Oral TID WC  . fludrocortisone  0.1 mg Oral Daily  . insulin aspart  0-15 Units Subcutaneous TID WC  . insulin aspart  0-5 Units Subcutaneous QHS  . insulin aspart  6 Units Subcutaneous TID WC  . insulin glargine  40 Units Subcutaneous Q24H  . lidocaine (PF)  5 mL Other Once  . meropenem (MERREM) IV  1 g Intravenous Q12H  . midodrine  10 mg Oral  TID WC  . pantoprazole  40 mg Oral Daily  . polyethylene glycol  17 g Oral Daily  . vancomycin  750 mg Intravenous Q24H  . warfarin  5 mg Oral ONCE-1800  . Warfarin - Pharmacist Dosing Inpatient   Does not apply q1800  Infusion Medicications . sodium chloride Stopped (10/01/13 2000)  . argatroban 0.5 mcg/kg/min (10/07/13 1200)  . DOBUTamine 6 mcg/kg/min (10/07/13 1200)  . dialysis replacement fluid (prismasate) 300 mL/hr at 10/07/13 0342  . dialysis replacement fluid (prismasate) 200 mL/hr at 10/07/13 0342  . dialysate (PRISMASATE) 1,500 mL/hr at 10/07/13 1006    LOS: 34 days   DUNHAM,CYNTHIA B 10/07/2013,1:07 PM

## 2013-10-07 NOTE — Progress Notes (Signed)
Pharmacy Consult Note  Pharmacy Consult:  Argatroban  Indication:  Thromboembolic state  Pharmacy Consult : Vancomycin and zosyn>>meropenem Indication: groin wound infection  Patient Measurements: Height: 5' 7.5" (171.5 cm) Weight: 264 lb 1.8 oz (119.8 kg) IBW/kg (Calculated) : 62.75  Vital Signs: Temp: 97.8 F (36.6 C) (06/21 0729) Temp src: Oral (06/21 0729) BP: 87/59 mmHg (06/21 1000) Pulse Rate: 109 (06/21 1000)  Labs:  Recent Labs  10/05/13 0430 10/05/13 0628  10/06/13 0405 10/06/13 0500 10/06/13 1552 10/07/13 0400  HGB 6.7* 6.7*  --  8.4*  --   --  7.9*  HCT 22.2* 22.5*  --  27.5*  --   --  26.1*  PLT 255 253  --  251  --   --  244  APTT 60*  --   --  51*  --   --  66*  LABPROT  --   --   --   --   --   --  20.1*  INR  --   --   --   --   --   --  1.77*  CREATININE 2.05*  --   < >  --  2.29* 1.96* 2.13*  < > = values in this interval not displayed.  Estimated Creatinine Clearance: 45.1 ml/min (by C-G formula based on Cr of 2.13).  Assessment: 13 YOF admitted 09/03/2013  with right foot ischemia s/p femoral endarterectomy and thrombectomy on 09/17/13.  S/p fasciotomy closure on 09/19/13. HIT panel negative. Pharmacy consulted to dose argatroban, vancomycin and meropenem.   PMH: DM, PN, CM, CHF EF 22%, CAD, GERD, HTN, lung CA s/p lobectomy  Coag: UE DVT,  Argatroban continues to be therapeutic on 0.48mcg/kg/min (aPTT 66sec),Warfarin started 6/20, INR 1.77 (consider 1.77 the baseline on argatroban).  H/H back up after 2Units PRBCs on 6/19.  Plt stable, no bleeding noted.  Per vascular surgery ok to resume warfarin  ID: Groin site infection: no fevers noted, wbc normal. Patient currently on day #11 of vancomycin and #8 of total beta lactam, currently on meropenem. Patient continues on CRRT, antibiotics have been dose adjusted accordingly.   Vanc trough done 6/18 and adjusted accordingly, recheck next week unless renal replacement changes.  6/3 bld - ng 6/10 wound  (leg) - multiple bacteria (F)  CV:  Cardiogenic shock, severe 3v CAD (not revascularized), EF 22%,  HR 110s, SBP 80-90s Dob@6 , NE off, fludrocortisone, ASA, atorva, midodrine  Neuro: "twitching", 6/13 amio stopped, 6/19 changed to meropenem  Renal Continues on CVVHD. For iHD when off pressors  Goal of Therapy:  aPTT 50 - 90 seconds Monitor platelets by anticoagulation protocol: Yes Vancomycin trough 15-20   Plan 1. Continue Argatroban at 0.5 mcg/kg/min 2. Daily aPTT 3. Continue vancomycin to 750 IV q24 hours 4. Continue meropenem  5. Warfarin 5 mg today  Thank you for allowing pharmacy to be a part of this patients care team.  Rowe Robert Pharm.D., BCPS, AQ-Cardiology Clinical Pharmacist 10/07/2013 10:17 AM Pager: [phone] Phone: [phone]

## 2013-10-08 ENCOUNTER — Ambulatory Visit: Payer: Medicare Other | Admitting: Family Medicine

## 2013-10-08 LAB — CARBOXYHEMOGLOBIN
CARBOXYHEMOGLOBIN: 2.7 % — AB (ref 0.5–1.5)
Carboxyhemoglobin: 1.9 % — ABNORMAL HIGH (ref 0.5–1.5)
Carboxyhemoglobin: 1.7 % — ABNORMAL HIGH (ref 0.5–1.5)
Methemoglobin: 0.7 % (ref 0.0–1.5)
Methemoglobin: 0.4 % (ref 0.0–1.5)
Methemoglobin: 0.7 % (ref 0.0–1.5)
O2 SAT: 50.8 %
O2 Saturation: 48.9 %
O2 Saturation: 84.3 %
Total hemoglobin: 9.2 g/dL — ABNORMAL LOW (ref 12.0–16.0)
Total hemoglobin: 6.8 g/dL — CL (ref 12.0–16.0)
Total hemoglobin: 11.7 g/dL — ABNORMAL LOW (ref 12.0–16.0)

## 2013-10-08 LAB — CBC
HCT: 26.4 % — ABNORMAL LOW (ref 36.0–46.0)
HEMATOCRIT: 24.4 % — AB (ref 36.0–46.0)
HEMOGLOBIN: 8.1 g/dL — AB (ref 12.0–15.0)
Hemoglobin: 7.5 g/dL — ABNORMAL LOW (ref 12.0–15.0)
MCH: 30 pg (ref 26.0–34.0)
MCH: 30.2 pg (ref 26.0–34.0)
MCHC: 30.7 g/dL (ref 30.0–36.0)
MCHC: 30.7 g/dL (ref 30.0–36.0)
MCV: 97.6 fL (ref 78.0–100.0)
MCV: 98.5 fL (ref 78.0–100.0)
Platelets: 238 10*3/uL (ref 150–400)
Platelets: 226 10*3/uL (ref 150–400)
RBC: 2.5 MIL/uL — ABNORMAL LOW (ref 3.87–5.11)
RBC: 2.68 MIL/uL — AB (ref 3.87–5.11)
RDW: 18.3 % — ABNORMAL HIGH (ref 11.5–15.5)
RDW: 18 % — ABNORMAL HIGH (ref 11.5–15.5)
WBC: 5.9 10*3/uL (ref 4.0–10.5)
WBC: 5.2 10*3/uL (ref 4.0–10.5)

## 2013-10-08 LAB — BASIC METABOLIC PANEL
BUN: 19 mg/dL (ref 6–23)
CO2: 27 mEq/L (ref 19–32)
Calcium: 8.3 mg/dL — ABNORMAL LOW (ref 8.4–10.5)
Chloride: 100 mEq/L (ref 96–112)
Creatinine, Ser: 1.98 mg/dL — ABNORMAL HIGH (ref 0.50–1.10)
GFR, EST AFRICAN AMERICAN: 34 mL/min — AB (ref >90)
GFR, EST NON AFRICAN AMERICAN: 29 mL/min — AB (ref >90)
GLUCOSE: 137 mg/dL — AB (ref 70–99)
Potassium: 4.9 mEq/L (ref 3.7–5.3)
Sodium: 135 mEq/L — ABNORMAL LOW (ref 137–147)

## 2013-10-08 LAB — PHOSPHORUS: PHOSPHORUS: 2.2 mg/dL — AB (ref 2.3–4.6)

## 2013-10-08 LAB — RENAL FUNCTION PANEL
ALBUMIN: 2 g/dL — AB (ref 3.5–5.2)
BUN: 19 mg/dL (ref 6–23)
CALCIUM: 8.1 mg/dL — AB (ref 8.4–10.5)
CO2: 25 mEq/L (ref 19–32)
CREATININE: 2.1 mg/dL — AB (ref 0.50–1.10)
Chloride: 100 mEq/L (ref 96–112)
GFR calc Af Amer: 32 mL/min — ABNORMAL LOW (ref >90)
GFR, EST NON AFRICAN AMERICAN: 27 mL/min — AB (ref >90)
Glucose, Bld: 186 mg/dL — ABNORMAL HIGH (ref 70–99)
Phosphorus: 4.1 mg/dL (ref 2.3–4.6)
Potassium: 5.4 mEq/L — ABNORMAL HIGH (ref 3.7–5.3)
Sodium: 135 mEq/L — ABNORMAL LOW (ref 137–147)

## 2013-10-08 LAB — PROTIME-INR
INR: 1.9 — ABNORMAL HIGH (ref 0.00–1.49)
PROTHROMBIN TIME: 21.2 s — AB (ref 11.6–15.2)

## 2013-10-08 LAB — MAGNESIUM: Magnesium: 2.6 mg/dL — ABNORMAL HIGH (ref 1.5–2.5)

## 2013-10-08 LAB — GLUCOSE, CAPILLARY: Glucose-Capillary: 145 mg/dL — ABNORMAL HIGH (ref 70–99)

## 2013-10-08 LAB — APTT: APTT: 60 s — AB (ref 24–37)

## 2013-10-08 MED ORDER — HYDROMORPHONE HCL PF 1 MG/ML IJ SOLN
1.0000 mg | Freq: Once | INTRAMUSCULAR | Status: AC
  Administered 2013-10-08: 1 mg via INTRAVENOUS

## 2013-10-08 MED ORDER — HYDROMORPHONE HCL PF 1 MG/ML IJ SOLN
INTRAMUSCULAR | Status: AC
  Administered 2013-10-08: 1 mg via INTRAVENOUS
  Filled 2013-10-08: qty 1

## 2013-10-08 MED ORDER — WARFARIN SODIUM 7.5 MG PO TABS
7.5000 mg | ORAL_TABLET | Freq: Once | ORAL | Status: AC
Start: 2013-10-08 — End: 2013-10-08
  Administered 2013-10-08: 7.5 mg via ORAL
  Filled 2013-10-08: qty 1

## 2013-10-08 MED ORDER — DEXTROSE 5 % IV SOLN
20.0000 mmol | Freq: Once | INTRAVENOUS | Status: AC
  Administered 2013-10-08: 20 mmol via INTRAVENOUS
  Filled 2013-10-08: qty 6.67

## 2013-10-08 NOTE — Progress Notes (Signed)
Inpatient Diabetes Program Recommendations  AACE/ADA: New Consensus Statement on Inpatient Glycemic Control (2013)  Target Ranges:  Prepandial:   less than 140 mg/dL      Peak postprandial:   less than 180 mg/dL (1-2 hours)      Critically ill patients:  140 - 180 mg/dL   Results for Hannah [name], Hannah [name] (MRN [id number]) as of 10/08/2013 09:54  Ref. Range 10/07/2013 07:32 10/07/2013 12:02 10/07/2013 17:09 10/07/2013 17:38 10/07/2013 22:03  Glucose-Capillary Latest Range: 70-99 mg/dL 148 (H) 100 (H) 69 (L) 74 185 (H)   Diabetes history: DM2 Outpatient Diabetes medications: Levemir 34 units QPM, Humalog 3 units TID with meals Current orders for Inpatient glycemic control: Lantus 40 units Q24H, Novolog 0-15 units AC, Novolog 0-5 units HS, Novolog 6 units TID with meals  Inpatient Diabetes Program Recommendations Insulin - Meal Coverage: Please consider decreasing Novolog meal coverage to 3 units TID with meals. NURSING: Be sure to follow ordered parameters for meal coverage which states Do NOT give if CBG < 80, if patient eats less than 50% of meal, or if patient is on premixed insulin.   Thanks, Barnie Alderman, RN, MSN, CCRN Diabetes Coordinator Inpatient Diabetes Program [phone] (Team Pager) [phone] ([facility] office) [phone] [facility] office)

## 2013-10-08 NOTE — Progress Notes (Signed)
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 Developed cardiogenic shock, cold foot after IABP requiring femoral thrombectomy and fasciotomy (6/1), VT/VF then PEA arrest 6/3. She required initiation of RRT 09/06/13 and has remained dialysis dependent.  Short trial of IHD unsuccessful and back to CRRT as of 6/4 with  inotrope requirement and inability to wean.  Assessment/Recommendations  1. Dialysis dependent anuric AoCKD5, now ESRD, have cont CRRT (Dialysis dependent since 5/21; current access is left TDC placed 5/27;  5/21 CRRT->IHD 5/26 ->back to CRRT 6/4 ; weight down 131->nadir of 113.6 with CRRT->allowed to drift back up some to see if could maintain BP. Running even feeling bloated, changed to pulling 25-50/hour yest   As previously stated will have to be able to maintain BP off systemic pressors and inotropes in order to transition to intermittent HD. Like Dr. Jeffie Pollock, I am not sure this will be possible... With current open groin wound, placement of PD catheter would carry great risk of infection due to proximity of the groin wound so possibility MAYBE after wound healed/infection cleared... 2. Hypophosphatemia - s/p 10 mmoles IV 6/15. Will give another bolus today 3. Cardiogenic shock, Ischemic CM  and not a CABG candidate; EF 22% by myoview; 30-35 by echo as per #1; on dobutamine,midodrine; NE off .  Dr. Jeffie Pollock added Florinef in hopes of some direct vascular effect aside from the mineralocorticoid effect on salt and water (which requires kidney function)- maybe can wean dobutamine today ? 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 S/p R femoral endarterectomy wound complications- wound VAC in place; notes reviewed. On vanco and zosyn with VAC changes per VVS. Changed from zosyn to meropenem. 6. CAD, ischemic CM 7. Anemia - on darbe 200/week; not at goal; iron studies appear "adequate" ; s/p 2  units for Hb of 6.9 (6/19) 8. Twitching/myoclonus - ATB changed zosyn->meropenem and is improving 9. Lung CA with prior LL Lobectomy 10. Right axillary DVT 11. Bilateral carotid stenosis 12. Diabetes - insulin  Subjective: BP is better last 2 hours ? Bloating and twitching better Low UF- weight stable Off pressors  Objective: Vital signs in last 24 hours: Temp:  [97.8 F (36.6 C)-98.1 F (36.7 C)] 98.1 F (36.7 C) (06/21 1931) Pulse Rate:  [77-117] 109 (06/22 0800) Resp:  [10-28] 17 (06/22 0800) BP: (72-131)/(17-112) 106/67 mmHg (06/22 0745) SpO2:  [89 %-100 %] 98 % (06/22 0800) Weight:  [119.5 kg (263 lb 7.2 oz)] 119.5 kg (263 lb 7.2 oz) (06/22 0700) Weight change: -0.3 kg (-10.6 oz)  Current CRRT Prescription:  Start Date: restarted 09/20/13  Catheter: TDC left BFR: 200  Pre Blood Pump: 300 DFR: 1500  Replacement Rate: 200  Goal UF      25-50 off  Per hour    Anticoagulation: argatroban systemic, clotted on heparin, +HIT  Clotting: not excessive  WEIGHT TRENDING 10/07/13 0630 119.8 kg  10/06/13 0700 117.9 kg 10/05/13 0500 117.2 kg (repeated - same) 10/04/13 0500 113.9 kg CRRT changed to "keep volume even" fluid removal goal 10/03/13 0500 113.6 kg  10/02/13 0500 114.5 kg  10/01/13 0648 114.6 kg  09/30/13 0500 117.6 kg  09/29/13 0600 121.7 kg  09/28/13 0600 123.8 kg 09/27/13 0500 127.2 kg  09/26/13 0500 130.9 kg  09/25/13 0500 133.5 kg 09/24/13 0600 134.5 kg  09/23/13 0700 132.8 kg  09/22/13 0500 131.1 kg  09/21/13 0500 129.6 kg  09/20/13 0500 131 kg   CRRT restarted -50-100/hour  PHYSICAL EXAMINATION General appearance: alert and cooperative, sitting up in bed, NAD BP 106/67  Pulse 109  Temp(Src) 98.1 F (36.7 C) (Oral)  Resp 17  Ht 5' 7.5" (1.715 m)  Wt 119.5 kg (263 lb 7.2 oz)  BMI 40.63 kg/m2  SpO2 98%  LMP 08/19/2013 CVP up to 20 acutely ? Alert,oriented, pleasant and up in the chair Central line R and TDC L Lungs clear to auscultation  bilaterally anteriorly   [facility] dressing dry and intact left chest Cardio: regular rate and rhythm S1, S2 normal, no murmur, click, rub or gallop GI: soft, non-tender; bowel sounds normal Extremities: 1+ edema to thighs Wound VAC in place R Groin; stapled incisions clean and dry on LE's Dusky tips of toes right foot #3-5 unchanged  Intake/Output from previous day: 06/14 0701 - 06/15 0700 In: 998.1 [I.V.:623.1; IV Piggyback:375] Out: 5433 [Drains:100]  Recent Labs  10/07/13 0400 10/07/13 1459 10/08/13 0345  NA 137 135* 135*  K 4.6 4.5 4.9  CL 101 100 100  CO2 26 25 27   GLUCOSE 124* 91 137*  BUN 22 18 19   CREATININE 2.13* 1.82* 1.98*  CALCIUM 8.1* 7.8* 8.3*  MG 2.4  --  2.6*  PHOS 2.1* 1.8* 2.2*    Recent Labs  10/07/13 0400 10/07/13 1459  ALBUMIN 1.8* 1.7*    Recent Labs  10/07/13 0400 10/08/13 0345  WBC 6.2 5.9  HGB 7.9* 7.5*  HCT 26.1* 24.4*  MCV 96.3 97.6  PLT 244 238   Results for MAREA, REASNER (MRN [id number]) as of 10/02/2013 09:13  Ref. Range 10/01/2013 08:30  Iron Latest Range: 42-135 ug/dL 45  UIBC Latest Range: 125-400 ug/dL 164  TIBC Latest Range: 250-470 ug/dL 209 (L)  Saturation Ratios Latest Range: 20-55 % 22  Ferritin Latest Range: 10-291 ng/mL 399 (H)    Scheduled Medications: . aspirin  81 mg Oral Daily  . atorvastatin  80 mg Oral q1800  . calcitRIOL  0.25 mcg Oral Daily  . darbepoetin (ARANESP) injection - NON-DIALYSIS  200 mcg Subcutaneous Q Wed-1800  . feeding supplement (NEPRO CARB STEADY)  237 mL Oral TID WC  . fludrocortisone  0.1 mg Oral Daily  . insulin aspart  0-15 Units Subcutaneous TID WC  . insulin aspart  0-5 Units Subcutaneous QHS  . insulin aspart  6 Units Subcutaneous TID WC  . insulin glargine  40 Units Subcutaneous Q24H  . lidocaine (PF)  5 mL Other Once  . meropenem (MERREM) IV  1 g Intravenous Q12H  . midodrine  10 mg Oral TID WC  . pantoprazole  40 mg Oral Daily  . polyethylene glycol  17 g Oral Daily  .  vancomycin  750 mg Intravenous Q24H  . Warfarin - Pharmacist Dosing Inpatient   Does not apply q1800  Infusion Medicications . sodium chloride Stopped (10/01/13 2000)  . argatroban 0.5 mcg/kg/min (10/07/13 2221)  . DOBUTamine 6 mcg/kg/min (10/07/13 2315)  . dialysis replacement fluid (prismasate) 300 mL/hr at 10/08/13 0726  . dialysis replacement fluid (prismasate) 200 mL/hr at 10/07/13 0342  . dialysate (PRISMASATE) 1,500 mL/hr at 10/08/13 0543    LOS: 35 days   GOLDSBOROUGH,KELLIE A 10/08/2013,8:09 AM

## 2013-10-08 NOTE — Progress Notes (Signed)
I agree with the above.  Continue wound VAC changes.  Was prepped

## 2013-10-08 NOTE — Progress Notes (Signed)
Heart failure MD(Dr. Claiborne Billings) notified of low coox and increased lower back and neck pain. Pain medicine prescribed

## 2013-10-08 NOTE — Progress Notes (Signed)
ANTICOAGULATION CONSULT NOTE - Follow Up Consult  Pharmacy Consult for Argatroban and Coumadin Indication: Thromboembolic state (RUE DVT)  Allergies  Allergen Reactions  . Crestor [Rosuvastatin] Other (See Comments)    Severe muscle weakness  . Nsaids Other (See Comments)    Not allergic, "bad on my kidneys"  . Ciprofloxacin Rash    Patient Measurements: Height: 5' 7.5" (171.5 cm) Weight: 263 lb 7.2 oz (119.5 kg) IBW/kg (Calculated) : 62.75  Vital Signs: Temp: 97.2 F (36.2 C) (06/22 0812) Temp src: Oral (06/22 0812) BP: 90/64 mmHg (06/22 1030) Pulse Rate: 110 (06/22 1030)  Labs:  Recent Labs  10/06/13 0405  10/07/13 0400 10/07/13 1459 10/08/13 0345  HGB 8.4*  --  7.9*  --  7.5*  HCT 27.5*  --  26.1*  --  24.4*  PLT 251  --  244  --  238  APTT 51*  --  66*  --  60*  LABPROT  --   --  20.1*  --  21.2*  INR  --   --  1.77*  --  1.90*  CREATININE  --   < > 2.13* 1.82* 1.98*  < > = values in this interval not displayed.  Estimated Creatinine Clearance: 48.4 ml/min (by C-G formula based on Cr of 1.98).   Medications:  Argatroban @ 0.5 mcg/kg/min  Assessment: 43yof continues on argatroban with a therapeutic aPTT. Coumadin was on hold for I&D/wound vac of the right groin, but was resumed 6/20. Argatroban can falsely elevate the INR - consider 1.77 to be her 'baseline' while on argratroban. Today INR has trended up slightly to 1.9. Right groin site with minimal bleeding. Hgb low but improved s/p PRBCs 6/19, platelets stable.   Goal of Therapy: INR ~4 while on argatroban APTT 50-90 seconds Monitor platelets by anticoagulation protocol: Yes   Plan:  1) Continue argatroban @ 0.8mcg/kg/min 2) Increase coumadin to 7.5mg  x 1 3) INR, aPTT, CBC in AM  Deboraha Sprang 10/08/2013,10:42 AM

## 2013-10-08 NOTE — Progress Notes (Signed)
Notified at 5:45 that Hannah [name] has been having pain. On exam, vitals reviewed; BP 77/60  Pulse 103  Temp(Src) 98.1 F (36.7 C) (Oral)  Resp 24  Ht 5' 7.5" (1.715 m)  Wt 119.8 kg (264 lb 1.8 oz)  BMI 40.73 kg/m2  SpO2 89%  LMP 05/03/2015she is alert, oriented and tells me she has a muscular type pain that has been improving with fentanyl. On exam, no tenderness over upper back but distinctly tender over right lateral/posterior hip. No obvious bruising/ecchymosis/fluctulence. Will give 1 mg IV dilaudid now and have team follow-up more closely. She has been mainly lying on her back but I discussed frequently skin/position checks with the primary RN.   Jules Husbands, MD

## 2013-10-08 NOTE — Progress Notes (Signed)
  [facility] Progress Note  10/08/2013 9:11 AM 10 Days Post-Op  Subjective:  Pt is having lower right back pain in area behind wound vac.   Tmax 98.1 BP sys 70s-130s 02 98% RA  Filed Vitals:   10/08/13 0812  BP:   Pulse:   Temp: 97.2 F (36.2 C)  Resp:     Physical Exam: Incisions:  Dressing changed today. Right groin with fibrinous tissue and minimal bleeding. Wound vac seal intact. No surrounding erythema. Right fasciotomy incisions with serosanguinous drainage on dressing. Incisions are clean without evidence of infection. Extremities:  Doppler signals bilaterally.   CBC    Component Value Date/Time   WBC 5.9 10/08/2013 0345   RBC 2.50* 10/08/2013 0345   HGB 7.5* 10/08/2013 0345   HCT 24.4* 10/08/2013 0345   PLT 238 10/08/2013 0345   MCV 97.6 10/08/2013 0345   MCH 30.0 10/08/2013 0345   MCHC 30.7 10/08/2013 0345   RDW 18.3* 10/08/2013 0345   LYMPHSABS 1.4 08/04/2013 1223   MONOABS 0.4 08/04/2013 1223   EOSABS 0.2 08/04/2013 1223   BASOSABS 0.1 08/04/2013 1223    BMET    Component Value Date/Time   NA 135* 10/08/2013 0345   K 4.9 10/08/2013 0345   CL 100 10/08/2013 0345   CO2 27 10/08/2013 0345   GLUCOSE 137* 10/08/2013 0345   BUN 19 10/08/2013 0345   CREATININE 1.98* 10/08/2013 0345   CREATININE 4.17* 07/06/2013 1618   CALCIUM 8.3* 10/08/2013 0345   GFRNONAA 29* 10/08/2013 0345   GFRNONAA 71 07/13/2011 0835   GFRAA 34* 10/08/2013 0345   GFRAA 82 07/13/2011 0835    INR    Component Value Date/Time   INR 1.90* 10/08/2013 0345     Intake/Output Summary (Last 24 hours) at 10/08/13 0911 Last data filed at 10/08/13 0900  Gross per 24 hour  Intake 1257.9 ml  Output   1422 ml  Net -164.1 ml     Assessment:  43 y.o. female is s/p:  IRRIGATION AND DEBRIDEMENT EXTREMITY (Right)  APPLICATION OF WOUND VAC (Right)  10 Days Post-Op  Plan: -Right groin is stable.  Mostly fibrinous tissue with little to no granulation tissue. Not healing very quickly.  Most likely due to current medical state. Continue MWF wound vac dressing changes.  -Continue saline gauze and kerlix to fasciotomy incisions. Keep area clean.  -Pt restarted on coumadin.    Virgina Jock, PA-C [facility] Office: [phone] Pager: [phone] 10/08/2013 9:11 AM

## 2013-10-09 LAB — GLUCOSE, CAPILLARY
GLUCOSE-CAPILLARY: 170 mg/dL — AB (ref 70–99)
GLUCOSE-CAPILLARY: 150 mg/dL — AB (ref 70–99)
Glucose-Capillary: 172 mg/dL — ABNORMAL HIGH (ref 70–99)
Glucose-Capillary: 159 mg/dL — ABNORMAL HIGH (ref 70–99)
Glucose-Capillary: 116 mg/dL — ABNORMAL HIGH (ref 70–99)

## 2013-10-09 LAB — CARBOXYHEMOGLOBIN
CARBOXYHEMOGLOBIN: 2.2 % — AB (ref 0.5–1.5)
Methemoglobin: 0.7 % (ref 0.0–1.5)
O2 Saturation: 57.2 %
Total hemoglobin: 6.9 g/dL — CL (ref 12.0–16.0)

## 2013-10-09 LAB — CBC
HCT: 25 % — ABNORMAL LOW (ref 36.0–46.0)
Hemoglobin: 7.6 g/dL — ABNORMAL LOW (ref 12.0–15.0)
MCH: 29.6 pg (ref 26.0–34.0)
MCHC: 30.4 g/dL (ref 30.0–36.0)
MCV: 97.3 fL (ref 78.0–100.0)
PLATELETS: 240 10*3/uL (ref 150–400)
RBC: 2.57 MIL/uL — ABNORMAL LOW (ref 3.87–5.11)
RDW: 18 % — AB (ref 11.5–15.5)
WBC: 6 10*3/uL (ref 4.0–10.5)

## 2013-10-09 LAB — RENAL FUNCTION PANEL
ALBUMIN: 1.9 g/dL — AB (ref 3.5–5.2)
ALBUMIN: 2 g/dL — AB (ref 3.5–5.2)
BUN: 18 mg/dL (ref 6–23)
BUN: 20 mg/dL (ref 6–23)
CHLORIDE: 98 meq/L (ref 96–112)
CO2: 27 mEq/L (ref 19–32)
CO2: 27 mEq/L (ref 19–32)
Calcium: 8.4 mg/dL (ref 8.4–10.5)
Calcium: 8.1 mg/dL — ABNORMAL LOW (ref 8.4–10.5)
Chloride: 101 mEq/L (ref 96–112)
Creatinine, Ser: 1.97 mg/dL — ABNORMAL HIGH (ref 0.50–1.10)
Creatinine, Ser: 2.01 mg/dL — ABNORMAL HIGH (ref 0.50–1.10)
GFR, EST AFRICAN AMERICAN: 34 mL/min — AB (ref >90)
GFR, EST AFRICAN AMERICAN: 33 mL/min — AB (ref >90)
GFR, EST NON AFRICAN AMERICAN: 30 mL/min — AB (ref >90)
GFR, EST NON AFRICAN AMERICAN: 29 mL/min — AB (ref >90)
Glucose, Bld: 188 mg/dL — ABNORMAL HIGH (ref 70–99)
Glucose, Bld: 157 mg/dL — ABNORMAL HIGH (ref 70–99)
PHOSPHORUS: 2.5 mg/dL (ref 2.3–4.6)
POTASSIUM: 4.9 meq/L (ref 3.7–5.3)
POTASSIUM: 5.1 meq/L (ref 3.7–5.3)
Phosphorus: 2.8 mg/dL (ref 2.3–4.6)
SODIUM: 137 meq/L (ref 137–147)
Sodium: 135 mEq/L — ABNORMAL LOW (ref 137–147)

## 2013-10-09 LAB — MAGNESIUM: Magnesium: 2.5 mg/dL (ref 1.5–2.5)

## 2013-10-09 LAB — APTT: aPTT: 53 seconds — ABNORMAL HIGH (ref 24–37)

## 2013-10-09 LAB — PREPARE RBC (CROSSMATCH)

## 2013-10-09 LAB — PROTIME-INR
INR: 1.76 — ABNORMAL HIGH (ref 0.00–1.49)
PROTHROMBIN TIME: 20 s — AB (ref 11.6–15.2)

## 2013-10-09 MED ORDER — WARFARIN SODIUM 10 MG PO TABS
10.0000 mg | ORAL_TABLET | Freq: Once | ORAL | Status: AC
  Administered 2013-10-09: 10 mg via ORAL
  Filled 2013-10-09: qty 1

## 2013-10-09 NOTE — Progress Notes (Signed)
Patient ID: Hannah [name], female   DOB: 1971-03-04, 43 y.o.   MRN: [id number]  ADVANCED HEART FAILURE ROUNDING NOTE   SUBJECTIVE  43 yo female with PMH of DM1, stage V kidney disease, blindness, h/o lung CA s/p resection who was recently discharged for heart failure symptom had a high risk stress test which showed ischemia in anterior and apical region. She underwent cath 09/04/2013 which showed chronically occluded RCA and triple vessel dx. CT surgery consulted but not felt to be surgical candidate due to poor PFTs, suboptimal targets, comorbidities and shock. EF 22% by Myoview. 30-35% by echo  Underwent placement of swan and IABP for shock (co-ox 38%). Unfortunately developed cold foot and IABP had to be removed.Underwent R femoral thrombectomy and 4 compartment fasciotomy of RLE on 6/1.On 6/3 had VT/VF->PEA arrest 10 mins CPR. U/s shows R axillary DVT  Levophed off. Dobutamine at 6. Attempting to run CVVHD a bit negative but BP soft and weight up 10 pounds. Co-ox down. Was having severe pain in hip and back - improved with sodium phosphate.  On midodrine 10 tid. Denies SOB or dizziness. Feels bloated. SBP 90s today with HR 110s ST. Myoclonus worse again    CURRENT MEDS . aspirin  81 mg Oral Daily  . atorvastatin  80 mg Oral q1800  . calcitRIOL  0.25 mcg Oral Daily  . darbepoetin (ARANESP) injection - NON-DIALYSIS  200 mcg Subcutaneous Q Wed-1800  . feeding supplement (NEPRO CARB STEADY)  237 mL Oral TID WC  . fludrocortisone  0.1 mg Oral Daily  . insulin aspart  0-15 Units Subcutaneous TID WC  . insulin aspart  0-5 Units Subcutaneous QHS  . insulin aspart  6 Units Subcutaneous TID WC  . insulin glargine  40 Units Subcutaneous Q24H  . lidocaine (PF)  5 mL Other Once  . meropenem (MERREM) IV  1 g Intravenous Q12H  . midodrine  10 mg Oral TID WC  . pantoprazole  40 mg Oral Daily  . polyethylene glycol  17 g Oral Daily  . vancomycin  750 mg Intravenous Q24H  . warfarin  10 mg Oral  ONCE-1800  . Warfarin - Pharmacist Dosing Inpatient   Does not apply q1800    OBJECTIVE  Filed Vitals:   10/09/13 1345 10/09/13 1400 10/09/13 1415 10/09/13 1430  BP: 109/83 60/29 91/61  102/41  Pulse:  114 113 105  Temp: 97.2 F (36.2 C) 97.5 F (36.4 C)    TempSrc: Oral Oral    Resp: 19 15 11 12   Height:      Weight:      SpO2: 100% 100% 100%     Intake/Output Summary (Last 24 hours) at 10/09/13 1434 Last data filed at 10/09/13 1415  Gross per 24 hour  Intake 883.45 ml  Output   1543 ml  Net -659.55 ml   Filed Weights   10/07/13 0630 10/08/13 0700 10/09/13 0615  Weight: 119.8 kg (264 lb 1.8 oz) 119.5 kg (263 lb 7.2 oz) 116.5 kg (256 lb 13.4 oz)    PHYSICAL EXAM  CVP 14 General:Lying in bed.  NAD Neuro: Alert and oriented X 3. Moves all extremities spontaneous HEENT:  Normal  Neck: Supple without bruits. Chest L subclav PC. R subclav TLC. Unable to see JVD Lungs:  clear Heart: tachy regular. +s3 Abdomen: Soft, non-tender, non-distended, BS + x 4.  Extremities: 1+ edema bilateral ankles dressing on R lower leg. Wound vac R groin  CBC  Recent Labs  10/08/13 1420 10/09/13  0414  WBC 5.2 6.0  HGB 8.1* 7.6*  HCT 26.4* 25.0*  MCV 98.5 97.3  PLT 226 937   Basic Metabolic Panel  Recent Labs  10/08/13 0345 10/08/13 1600 10/09/13 0414  NA 135* 135* 135*  K 4.9 5.4* 4.9  CL 100 100 98  CO2 27 25 27   GLUCOSE 137* 186* 188*  BUN 19 19 18   CREATININE 1.98* 2.10* 1.97*  CALCIUM 8.3* 8.1* 8.4  MG 2.6*  --  2.5  PHOS 2.2* 4.1 2.8   Liver Function Tests  Recent Labs  10/08/13 1600 10/09/13 0414  ALBUMIN 2.0* 1.9*   Cardiac Enzymes No results found for this basename: CKTOTAL, CKMB, CKMBINDEX, TROPONINI,  in the last 72 hours Thyroid Function Tests No results found for this basename: TSH, T4TOTAL, FREET3, T3FREE, THYROIDAB,  in the last 72 hours  TELE  Sinus tachycardia 100-110   Radiology/Studies  Dg Chest 2 View  09/06/2013   CLINICAL DATA:   CHF, intermittent dyspnea  EXAM: CHEST  2 VIEW  COMPARISON:  Prior chest x-ray 08/13/2013; prior chest CT 08/22/2013  FINDINGS: Stable cardiac and mediastinal contours. Atherosclerotic calcifications are present within the transverse aorta. Moderate left and small right pleural effusions are similar compared to prior. There is persistent associated bibasilar atelectasis. Mild pulmonary vascular congestion without overt edema. No pneumothorax. No new focal airspace consolidation. No acute osseous abnormality.  IMPRESSION: 1. Stable moderate left and small right pleural effusions and associated bibasilar atelectasis. 2. Pulmonary vascular congestion without overt edema.   Electronically Signed   By: Jacqulynn Cadet M.D.   On: 09/06/2013 07:55   Ct Chest Wo Contrast  08/22/2013   CLINICAL DATA:  Left lung cancer status post lobectomy. Weight gain. Renal insufficiency.  EXAM: CT CHEST WITHOUT CONTRAST  TECHNIQUE: Multidetector CT imaging of the chest was performed following the standard protocol without IV contrast.  COMPARISON:  Radiographs 08/13/2013 and 08/04/2013.  CT 08/02/2012.  FINDINGS: There are stable postsurgical changes related to prior left lower lobe resection. There has been interval enlargement of several mediastinal lymph nodes. These include 12 mm right paratracheal (image 19), 13 mm precarinal (image 23), and 14 mm AP window (image 25) lymph nodes. Some of these nodes have retained fatty hila. Allowing for the limitations of noncontrast technique, the hila appear stable.  There is stable low-density within the right thyroid lobe. Atherosclerosis of the aorta, great vessels and coronary arteries is noted. The heart size is normal. There is no pericardial effusion.  Moderate size dependent pleural effusions are present bilaterally. There is associated dependent airspace disease in both lower lobes. In addition, there are more focal airspace opacities within the right lower lobe and inferior aspect  of the left upper lobe. The latter is somewhat nodular, measuring up to 1.8 cm on image 43. No other focal nodularity or endobronchial lesions are demonstrated.  The visualized upper abdomen has a stable appearance. There is no adrenal mass. There is increased subcutaneous edema throughout the subcutaneous fat, especially within the anterior aspect of the upper abdomen.  No worrisome osseous findings are demonstrated.  IMPRESSION: 1. As demonstrated radiographically, there are bilateral pleural effusions and bilateral airspace opacities which are new compared with the prior CT. Although there are focal somewhat nodular components in the right lower and left upper lobes, these findings are most likely infectious/inflammatory. 2. Progressive mediastinal lymphadenopathy. Some of the nodes have retained fatty hila and may be reactive. Metastatic disease cannot be completely excluded. 3. Stable atherosclerosis and  low-density within the right thyroid lobe. 4. If the airspace opacities and pleural effusions fail to respond to appropriate clinical therapy, follow-up CT or PET-CT may be warranted to exclude metastatic disease.   Electronically Signed   By: Camie Patience M.D.   On: 08/22/2013 10:26   Dg Chest Portable 1 View  08/13/2013   CLINICAL DATA:  Shortness of breath increasing for 2 weeks. History of CHF.  EXAM: PORTABLE CHEST - 1 VIEW  COMPARISON:  08/04/2013  FINDINGS: Again noted are bibasilar lung densities that are suggestive for pleural effusions and atelectasis. Heart size appears to be enlarged. There is some peribronchial thickening and cannot exclude pulmonary edema.  IMPRESSION: Bilateral pleural effusions with basilar atelectasis. There is mild pulmonary edema. Minimal change from the previous examination.   Electronically Signed   By: Markus Daft M.D.   On: 08/13/2013 23:10    ASSESSMENT AND PLAN 1. Cardiogenic shock 2. Acute on chronic systolic HF     EF ~88-28% 3. 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 Acute  respiratory failure 5. 3-V CAD, severe.  6. Carotid stenosis     - doppler 08/31/2013 shows R 60-79% ICA stenosis, L 80-99% ICA stenosis     - vascular surgery aware 7. 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 DM1 9. Ischemic R foot s/p R femoral thrombectomy and 4-compartment fasciotomy.  10. VT/VF arrest 6/3 11. R axillary DVT 12. Severe protein calorie malnutrition  13. Large right groin wound s/p debridement 6/12 14. Anemia: Transfuse hemoglobin < 7.  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 Norepi is off but BP very soft and still on significant dose of dobutamine with falling co-ox c/w shock. She is also beginning to re-accumulate fluid. I do not see any way that we will be able to stabilize her enough to permit outpatient HD off inotropes. We have discussed palliative care previously and she was not receptive to this. We wil continue to wean dobutamine slowly and see what happens. Continue midodrine with addition of low-dose Florinef in hopes of some assistance in increasing vascular tone. Recent cortisol level was ok.   Continue medical management of CAD. Long-term management of HF is problematic as she cannot tolerate any HF meds.   Wound management per [facility] and Wound Care. Continue abx for now. She has been started on coumadin, will need overlap with argatroban given HIT. Her wounds do not seem to be healing well/   Myoclonus not much improved despite switching zosyn to meropenem.   The patient is critically ill with multiple organ systems failure and requires high complexity decision making for assessment and support, frequent evaluation and titration of therapies, application of advanced monitoring technologies and extensive interpretation of multiple databases.   Critical Care Time devoted to patient care services described in this note is  35 Minutes.    Benay Spice 2:34 PM

## 2013-10-09 NOTE — Evaluation (Signed)
Physical Therapy Evaluation Patient Details Name: Hannah [name] MRN: [id number] DOB: [birth year] Today's Date: 10/09/2013   History of Present Illness  43 yo WF with  DM, advanced CKD 5 at baseline.  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 Developed cardiogenic shock, cold foot after IABP requiring femoral thrombectomy and fasciotomy (6/1), VT/VF then PEA arrest 6/3. She required initiation of RRT 09/06/13 and has remained dialysis dependent.  Short trial of IHD unsuccessful and back to CRRT as of 6/4 with  inotrope requirement and inability to wean  Clinical Impression  Pt admitted with above. Pt currently with functional limitations due to the deficits listed below (see PT Problem List).  Pt will benefit from skilled PT to increase their independence and safety with mobility to allow discharge to the most appropriate venue based on her medical issues. Pt having myoclonic jerks/asterixis that makes it difficult to mobilize pt due to high fall risk. Used the Stedy to decrease fall risk.     Follow Up Recommendations Other (comment) (Depends on medical options for dc)    Equipment Recommendations  Other (comment) (to be determined)    Recommendations for Other Services       Precautions / Restrictions Precautions Precautions: Fall Precaution Comments: multiple lines/tubes      Mobility  Bed Mobility Overal bed mobility: Needs Assistance Bed Mobility: Supine to Sit     Supine to sit: Min assist     General bed mobility comments: Assist to bring trunk up  Transfers Overall transfer level: Needs assistance Equipment used:  Charlaine Dalton) Transfers: Sit to/from [employer] to Stand: +2 physical assistance;Min assist Stand pivot transfers: +2 physical assistance;Min assist       General transfer comment: Used Stedy for pt to stand and to pivot due to myoclonic jerks/asterixis.  Ambulation/Gait                 Stairs            Wheelchair Mobility    Modified Rankin (Stroke Patients Only)       Balance Overall balance assessment: Needs assistance Sitting-balance support: Bilateral upper extremity supported;Feet supported Sitting balance-Leahy Scale: Poor Sitting balance - Comments: Due to myoclonic jerks/asterixis needs UE support                                     Pertinent Vitals/Pain VSS    Home Living Family/patient expects to be discharged to:: Private residence Living Arrangements: Spouse/significant other;Children;Parent                    Prior Function Level of Independence: Independent               Hand Dominance        Extremity/Trunk Assessment   Upper Extremity Assessment: Generalized weakness           Lower Extremity Assessment: Generalized weakness         Communication   Communication: No difficulties  Cognition Arousal/Alertness: Awake/alert Behavior During Therapy: WFL for tasks assessed/performed Overall Cognitive Status: Within Functional Limits for tasks assessed                      General Comments      Exercises        Assessment/Plan    PT  Assessment Patient needs continued PT services  PT Diagnosis Generalized weakness;Difficulty walking   PT Problem List Decreased strength;Decreased activity tolerance;Decreased balance;Decreased mobility;Decreased knowledge of use of DME  PT Treatment Interventions DME instruction;Gait training;Functional mobility training;Therapeutic activities;Therapeutic exercise;Balance training;Patient/family education   PT Goals (Current goals can be found in the Care Plan section) Acute Rehab PT Goals Patient Stated Goal: Pt agreeable to work on incr mobility PT Goal Formulation: With patient Time For Goal Achievement: 10/23/13 Potential to Achieve Goals: Fair    Frequency Min 2X/week   Barriers to discharge        Co-evaluation                End of Session Equipment Utilized During Treatment: Other (comment) Charlaine Dalton) Activity Tolerance: Patient tolerated treatment well Patient left: in chair;with call bell/phone within reach Nurse Communication: Mobility status;Need for lift equipment         Time: 1150-1210 PT Time Calculation (min): 20 min   Charges:   PT Evaluation $Initial PT Evaluation Tier I: 1 Procedure PT Treatments $Gait Training: 8-22 mins   PT G Codes:          MAYCOCK,CARY 2013-11-03, 1:25 PM  [facility] PT [phone]

## 2013-10-09 NOTE — Progress Notes (Signed)
Patient ID: Hannah [name], female   DOB: December 21, 1970, 43 y.o.   MRN: [id number]  ADVANCED HEART FAILURE ROUNDING NOTE   SUBJECTIVE  43 yo female with PMH of DM1, stage V kidney disease, blindness, h/o lung CA s/p resection who was recently discharged for heart failure symptom had a high risk stress test which showed ischemia in anterior and apical region. She underwent cath 09/04/2013 which showed chronically occluded RCA and triple vessel dx. CT surgery consulted but not felt to be surgical candidate due to poor PFTs, suboptimal targets, comorbidities and shock. EF 22% by Myoview. 30-35% by echo  Underwent placement of swan and IABP for shock (co-ox 38%). Unfortunately developed cold foot and IABP had to be removed.Underwent R femoral thrombectomy and 4 compartment fasciotomy of RLE on 6/1.On 6/3 had VT/VF->PEA arrest 10 mins CPR. U/s shows R axillary DVT  Yesterday started having hypotension and co-ox dropped into the lower 50s. Changed CVVH to not pull fluids and keep even. Dobutmaine turned down to 83mcg.Marland Kitchen CVVH now pulling 25cc/hr. CVP 10-13. 24 hr I/O -11 cc/ HR 100-115s. Denies SOB, orthopnea or dizziness. Co-ox 57% this am. Hgb trending down 7.6. SBP 90-100s.   CURRENT MEDS . aspirin  81 mg Oral Daily  . atorvastatin  80 mg Oral q1800  . calcitRIOL  0.25 mcg Oral Daily  . darbepoetin (ARANESP) injection - NON-DIALYSIS  200 mcg Subcutaneous Q Wed-1800  . feeding supplement (NEPRO CARB STEADY)  237 mL Oral TID WC  . fludrocortisone  0.1 mg Oral Daily  . insulin aspart  0-15 Units Subcutaneous TID WC  . insulin aspart  0-5 Units Subcutaneous QHS  . insulin aspart  6 Units Subcutaneous TID WC  . insulin glargine  40 Units Subcutaneous Q24H  . lidocaine (PF)  5 mL Other Once  . meropenem (MERREM) IV  1 g Intravenous Q12H  . midodrine  10 mg Oral TID WC  . pantoprazole  40 mg Oral Daily  . polyethylene glycol  17 g Oral Daily  . vancomycin  750 mg Intravenous Q24H  . warfarin  10 mg Oral  ONCE-1800  . Warfarin - Pharmacist Dosing Inpatient   Does not apply q1800    OBJECTIVE  Filed Vitals:   10/09/13 1200 10/09/13 1207 10/09/13 1215 10/09/13 1245  BP:    56/44  Pulse: 59     Temp:  97.6 F (36.4 C)    TempSrc:  Oral    Resp:   27   Height:      Weight:      SpO2: 99%  100%     Intake/Output Summary (Last 24 hours) at 10/09/13 1343 Last data filed at 10/09/13 1300  Gross per 24 hour  Intake 1115.4 ml  Output   1418 ml  Net -302.6 ml   Filed Weights   10/07/13 0630 10/08/13 0700 10/09/13 0615  Weight: 264 lb 1.8 oz (119.8 kg) 263 lb 7.2 oz (119.5 kg) 256 lb 13.4 oz (116.5 kg)    PHYSICAL EXAM  CVP 12 General: Sitting up in chair.  NAD Neuro: Alert and oriented X 3. Moves all extremities spontaneous HEENT:  Normal  Neck: Supple without bruits. Chest L subclav PC. R subclav TLC. Unable to see JVD Lungs:  clear Heart: tachy regular. +s3 Abdomen: Soft, non-tender, non-distended, BS + x 4.  Extremities: 1+ edema bilateral ankles. Dressing on RLE. Wound vac in R groin   CBC  Recent Labs  10/08/13 1420 10/09/13 0414  WBC 5.2 6.0  HGB 8.1* 7.6*  HCT 26.4* 25.0*  MCV 98.5 97.3  PLT 226 829   Basic Metabolic Panel  Recent Labs  10/08/13 0345 10/08/13 1600 10/09/13 0414  NA 135* 135* 135*  K 4.9 5.4* 4.9  CL 100 100 98  CO2 27 25 27   GLUCOSE 137* 186* 188*  BUN 19 19 18   CREATININE 1.98* 2.10* 1.97*  CALCIUM 8.3* 8.1* 8.4  MG 2.6*  --  2.5  PHOS 2.2* 4.1 2.8   Liver Function Tests  Recent Labs  10/08/13 1600 10/09/13 0414  ALBUMIN 2.0* 1.9*   Cardiac Enzymes No results found for this basename: CKTOTAL, CKMB, CKMBINDEX, TROPONINI,  in the last 72 hours Thyroid Function Tests No results found for this basename: TSH, T4TOTAL, FREET3, T3FREE, THYROIDAB,  in the last 72 hours  TELE  Sinus tachycardia 100-110   Radiology/Studies  Dg Chest 2 View  09/06/2013   CLINICAL DATA:  CHF, intermittent dyspnea  EXAM: CHEST  2 VIEW   COMPARISON:  Prior chest x-ray 08/13/2013; prior chest CT 08/22/2013  FINDINGS: Stable cardiac and mediastinal contours. Atherosclerotic calcifications are present within the transverse aorta. Moderate left and small right pleural effusions are similar compared to prior. There is persistent associated bibasilar atelectasis. Mild pulmonary vascular congestion without overt edema. No pneumothorax. No new focal airspace consolidation. No acute osseous abnormality.  IMPRESSION: 1. Stable moderate left and small right pleural effusions and associated bibasilar atelectasis. 2. Pulmonary vascular congestion without overt edema.   Electronically Signed   By: Jacqulynn Cadet M.D.   On: 09/06/2013 07:55   Ct Chest Wo Contrast  08/22/2013   CLINICAL DATA:  Left lung cancer status post lobectomy. Weight gain. Renal insufficiency.  EXAM: CT CHEST WITHOUT CONTRAST  TECHNIQUE: Multidetector CT imaging of the chest was performed following the standard protocol without IV contrast.  COMPARISON:  Radiographs 08/13/2013 and 08/04/2013.  CT 08/02/2012.  FINDINGS: There are stable postsurgical changes related to prior left lower lobe resection. There has been interval enlargement of several mediastinal lymph nodes. These include 12 mm right paratracheal (image 19), 13 mm precarinal (image 23), and 14 mm AP window (image 25) lymph nodes. Some of these nodes have retained fatty hila. Allowing for the limitations of noncontrast technique, the hila appear stable.  There is stable low-density within the right thyroid lobe. Atherosclerosis of the aorta, great vessels and coronary arteries is noted. The heart size is normal. There is no pericardial effusion.  Moderate size dependent pleural effusions are present bilaterally. There is associated dependent airspace disease in both lower lobes. In addition, there are more focal airspace opacities within the right lower lobe and inferior aspect of the left upper lobe. The latter is somewhat  nodular, measuring up to 1.8 cm on image 43. No other focal nodularity or endobronchial lesions are demonstrated.  The visualized upper abdomen has a stable appearance. There is no adrenal mass. There is increased subcutaneous edema throughout the subcutaneous fat, especially within the anterior aspect of the upper abdomen.  No worrisome osseous findings are demonstrated.  IMPRESSION: 1. As demonstrated radiographically, there are bilateral pleural effusions and bilateral airspace opacities which are new compared with the prior CT. Although there are focal somewhat nodular components in the right lower and left upper lobes, these findings are most likely infectious/inflammatory. 2. Progressive mediastinal lymphadenopathy. Some of the nodes have retained fatty hila and may be reactive. Metastatic disease cannot be completely excluded. 3. Stable atherosclerosis and low-density within the right thyroid lobe.  4. If the airspace opacities and pleural effusions fail to respond to appropriate clinical therapy, follow-up CT or PET-CT may be warranted to exclude metastatic disease.   Electronically Signed   By: Camie Patience M.D.   On: 08/22/2013 10:26   Dg Chest Portable 1 View  08/13/2013   CLINICAL DATA:  Shortness of breath increasing for 2 weeks. History of CHF.  EXAM: PORTABLE CHEST - 1 VIEW  COMPARISON:  08/04/2013  FINDINGS: Again noted are bibasilar lung densities that are suggestive for pleural effusions and atelectasis. Heart size appears to be enlarged. There is some peribronchial thickening and cannot exclude pulmonary edema.  IMPRESSION: Bilateral pleural effusions with basilar atelectasis. There is mild pulmonary edema. Minimal change from the previous examination.   Electronically Signed   By: Markus Daft M.D.   On: 08/13/2013 23:10    ASSESSMENT AND PLAN 1. Cardiogenic shock 2. Acute on chronic systolic HF     EF ~94-85% 3. 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 Acute respiratory failure 5. 3-V CAD, severe.  6.  Carotid stenosis     - doppler 08/31/2013 shows R 60-79% ICA stenosis, L 80-99% ICA stenosis     - vascular surgery aware 7. 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 DM1 9. Ischemic R foot s/p R femoral thrombectomy and 4-compartment fasciotomy.  10. VT/VF arrest 6/3 11. R axillary DVT 12. Severe protein calorie malnutrition  13. Large right groin wound s/p debridement 6/12 14. Anemia: Transfuse hemoglobin < 7.  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 Would consider trying to pull off some fluid via CVVH today and tolerate SBP down to 80.  Will hopefully start trying to titrate down dobutamine tomorrow.   I am increasingly concerned that we will not be able to liberate her from inotropes to permit HD (she needs to be off dobutamine). Renal feels placement of PD catheter not possible at this time due to risk of infection from groin wound. Co-ox ok today.  Continue midodrine with addition of low-dose Florinef in hopes of some assistance in increasing vascular tone. Recent cortisol level was ok.   Continue medical management of CAD. Long-term management of HF is problematic as she cannot tolerate any HF meds.   Wound management per [facility] and Wound Care. Continue abx for now. She has been started on coumadin, will need overlap with argatroban given HIT.   Myoclonus improving. Zosyn switched to meropenem  Junie Bame B,MD 1:43 PM   Patient seen and examined with Junie Bame, NP. We discussed all aspects of the encounter. I agree with the assessment and plan as stated above.   Remains off levophed. Tolerating dobutamine wean. Will see how it goes. I remained very concerned about our ability to get her off inotropes and stabilize her enough to tolerate HD. She is not interested in Palliative Care at this time.   Agree with transfusing 1u  PRBCs.  Wound care per VVS. Continue abx.   Myoclonus remains a big issue. Would she benefit from anti-seizure medication?  INR 1.76 (goal is 4.0 on argatroban) - Pharmacy managing.   Benay Spice 2:48 PM

## 2013-10-09 NOTE — Progress Notes (Signed)
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 Developed cardiogenic shock, cold foot after IABP requiring femoral thrombectomy and fasciotomy (6/1), VT/VF then PEA arrest 6/3. She required initiation of RRT 09/06/13 and has remained dialysis dependent.  Short trial of IHD unsuccessful and back to CRRT as of 6/4 with  inotrope requirement and inability to wean.  Assessment/Recommendations  1. Dialysis dependent anuric AoCKD5, now ESRD, have cont CRRT (Dialysis dependent since 5/21; current access is left TDC placed 5/27;  5/21 CRRT->IHD 5/26 ->back to CRRT 6/4 ; weight down 131->nadir of 113.6 with CRRT->allowed to drift back up some to see if could maintain BP. Running even feeling bloated, changed to pulling 25-50/hour.  As previously stated will have to be able to maintain BP off inotropes in order to transition to intermittent HD. Like Dr. Jeffie Pollock, I am not sure this will be possible... With current open groin wound, placement of PD catheter would carry great risk of infection due to proximity of the groin wound so possibility MAYBE after wound healed/infection cleared... 2. Hypophosphatemia - intermittent replacement 3. Cardiogenic shock, Ischemic CM  and not a CABG candidate; EF 22% by myoview; 30-35 by echo as per #1; on dobutamine,midodrine and florinef-  NE off .   maybe can continue to wean dobutamine today ? 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 S/p R femoral endarterectomy wound complications- wound VAC in place; notes reviewed. On vanco and zosyn with VAC changes per VVS. Changed from zosyn to meropenem. 6. CAD, ischemic CM 7. Anemia - on darbe 200/week;  iron studies appear "adequate" ; s/p 2 units for Hb of 6.9 (6/19)- back on anticoagulation 8. Twitching/myoclonus - ATB changed zosyn->meropenem and is improving 9. Lung CA with prior LL Lobectomy 10. Right axillary DVT 11. Bilateral  carotid stenosis 12. Diabetes - insulin 13. Deconditioning- PT   Subjective: BP is stable- dobut weaned some yesterday Bloating better and twitching worse Low UF- weight stable   Objective: Vital signs in last 24 hours: Temp:  [97.2 F (36.2 C)-98.1 F (36.7 C)] 97.6 F (36.4 C) (06/23 0742) Pulse Rate:  [59-116] 115 (06/23 0715) Resp:  [9-25] 14 (06/23 0715) BP: (68-127)/(26-93) 100/65 mmHg (06/23 0715) SpO2:  [89 %-100 %] 100 % (06/23 0715) Weight:  [116.5 kg (256 lb 13.4 oz)] 116.5 kg (256 lb 13.4 oz) (06/23 0615) Weight change: -3 kg (-6 lb 9.8 oz)  Current CRRT Prescription:  Start Date: restarted 09/20/13  Catheter: TDC left BFR: 200  Pre Blood Pump: 300 DFR: 1500  Replacement Rate: 200  Goal UF      25-50 off  Per hour    Anticoagulation: argatroban systemic, clotted on heparin, +HIT  Clotting: not excessive  WEIGHT TRENDING 10/09/13 116.5 10/08/13 119.5 10/07/13 0630 119.8 kg  10/06/13 0700 117.9 kg 10/05/13 0500 117.2 kg (repeated - same) 10/04/13 0500 113.9 kg CRRT changed to "keep volume even" fluid removal goal 10/03/13 0500 113.6 kg  10/02/13 0500 114.5 kg  10/01/13 0648 114.6 kg  09/30/13 0500 117.6 kg  09/29/13 0600 121.7 kg  09/28/13 0600 123.8 kg 09/27/13 0500 127.2 kg  09/26/13 0500 130.9 kg  09/25/13 0500 133.5 kg 09/24/13 0600 134.5 kg  09/23/13 0700 132.8 kg  09/22/13 0500 131.1 kg  09/21/13 0500 129.6 kg  09/20/13 0500 131 kg   CRRT restarted -50-100/hour  PHYSICAL EXAMINATION General appearance: alert  and cooperative, sitting up in bed, NAD BP 100/65  Pulse 115  Temp(Src) 97.6 F (36.4 C) (Oral)  Resp 14  Ht 5' 7.5" (1.715 m)  Wt 116.5 kg (256 lb 13.4 oz)  BMI 39.61 kg/m2  SpO2 100%  LMP 08/19/2013 CVP up to 20 acutely ? Alert,oriented, pleasant and up in the chair Central line R and TDC L Lungs clear to auscultation bilaterally anteriorly   [facility] dressing dry and intact left chest Cardio: regular rate and rhythm S1, S2  normal, no murmur, click, rub or gallop GI: soft, non-tender; bowel sounds normal Extremities: 1+ edema to thighs Wound VAC in place R Groin; stapled incisions clean and dry on LE's Dusky tips of toes right foot #3-5 unchanged  Intake/Output from previous day: 06/14 0701 - 06/15 0700 In: 998.1 [I.V.:623.1; IV Piggyback:375] Out: 5433 [Drains:100]  Recent Labs  10/08/13 0345 10/08/13 1600 10/09/13 0414  NA 135* 135* 135*  K 4.9 5.4* 4.9  CL 100 100 98  CO2 27 25 27   GLUCOSE 137* 186* 188*  BUN 19 19 18   CREATININE 1.98* 2.10* 1.97*  CALCIUM 8.3* 8.1* 8.4  MG 2.6*  --  2.5  PHOS 2.2* 4.1 2.8    Recent Labs  10/08/13 1600 10/09/13 0414  ALBUMIN 2.0* 1.9*    Recent Labs  10/08/13 1420 10/09/13 0414  WBC 5.2 6.0  HGB 8.1* 7.6*  HCT 26.4* 25.0*  MCV 98.5 97.3  PLT 226 240   Results for DODI, LEU (MRN [id number]) as of 10/02/2013 09:13  Ref. Range 10/01/2013 08:30  Iron Latest Range: 42-135 ug/dL 45  UIBC Latest Range: 125-400 ug/dL 164  TIBC Latest Range: 250-470 ug/dL 209 (L)  Saturation Ratios Latest Range: 20-55 % 22  Ferritin Latest Range: 10-291 ng/mL 399 (H)    Scheduled Medications: . aspirin  81 mg Oral Daily  . atorvastatin  80 mg Oral q1800  . calcitRIOL  0.25 mcg Oral Daily  . darbepoetin (ARANESP) injection - NON-DIALYSIS  200 mcg Subcutaneous Q Wed-1800  . feeding supplement (NEPRO CARB STEADY)  237 mL Oral TID WC  . fludrocortisone  0.1 mg Oral Daily  . insulin aspart  0-15 Units Subcutaneous TID WC  . insulin aspart  0-5 Units Subcutaneous QHS  . insulin aspart  6 Units Subcutaneous TID WC  . insulin glargine  40 Units Subcutaneous Q24H  . lidocaine (PF)  5 mL Other Once  . meropenem (MERREM) IV  1 g Intravenous Q12H  . midodrine  10 mg Oral TID WC  . pantoprazole  40 mg Oral Daily  . polyethylene glycol  17 g Oral Daily  . vancomycin  750 mg Intravenous Q24H  . Warfarin - Pharmacist Dosing Inpatient   Does not apply q1800  Infusion  Medicications . sodium chloride Stopped (10/01/13 2000)  . argatroban 0.5 mcg/kg/min (10/09/13 0700)  . DOBUTamine 5 mcg/kg/min (10/09/13 0700)  . dialysis replacement fluid (prismasate) 300 mL/hr at 10/09/13 0657  . dialysis replacement fluid (prismasate) 200 mL/hr at 10/09/13 0657  . dialysate (PRISMASATE) 1,500 mL/hr at 10/09/13 0657    LOS: 36 days   Kaden Dunkel A 10/09/2013,8:09 AM

## 2013-10-09 NOTE — Progress Notes (Signed)
ANTICOAGULATION CONSULT NOTE - Follow Up Consult  Pharmacy Consult for Argatroban and Coumadin Indication: Thromboembolic state (RUE DVT)  Allergies  Allergen Reactions  . Crestor [Rosuvastatin] Other (See Comments)    Severe muscle weakness  . Nsaids Other (See Comments)    Not allergic, "bad on my kidneys"  . Ciprofloxacin Rash    Patient Measurements: Height: 5' 7.5" (171.5 cm) Weight: 256 lb 13.4 oz (116.5 kg) IBW/kg (Calculated) : 62.75  Vital Signs: Temp: 97.6 F (36.4 C) (06/23 1207) Temp src: Oral (06/23 1207) BP: 97/55 mmHg (06/23 1145) Pulse Rate: 59 (06/23 1200)  Labs:  Recent Labs  10/07/13 0400  10/08/13 0345 10/08/13 1420 10/08/13 1600 10/09/13 0414  HGB 7.9*  --  7.5* 8.1*  --  7.6*  HCT 26.1*  --  24.4* 26.4*  --  25.0*  PLT 244  --  238 226  --  240  APTT 66*  --  60*  --   --  53*  LABPROT 20.1*  --  21.2*  --   --  20.0*  INR 1.77*  --  1.90*  --   --  1.76*  CREATININE 2.13*  < > 1.98*  --  2.10* 1.97*  < > = values in this interval not displayed.  Estimated Creatinine Clearance: 48 ml/min (by C-G formula based on Cr of 1.97).   Medications:  Argatroban @ 0.5 mcg/kg/min  Assessment: 43yof continues on argatroban with a therapeutic aPTT. Coumadin was on hold for I&D/wound vac of the right groin, but was resumed 6/20. Argatroban can falsely elevate the INR - consider 1.77 to be her 'baseline' while on argratroban. Today INR has decreased some despite increasing coumadin dose yesterday - will increase further today. Hgb down to 7.6 - to receive 1 unit of blood, platelets stable. Right groin with minimal bleeding.  Goal of Therapy: INR ~4 while on argatroban APTT 50-90 seconds Monitor platelets by anticoagulation protocol: Yes   Plan:  1) Continue argatroban @ 0.58mcg/kg/min 2) Increase coumadin to 10mg  x 1 3) INR, aPTT, CBC in AM  Deboraha Sprang 10/09/2013,12:42 PM

## 2013-10-09 NOTE — Progress Notes (Signed)
Patient stood to be weighed with two RN assist. She stood well. Sat back on side of bed after weight. She stood a second time with two RN assist to move to top of bed. She took one side step towards top of bed with a RN  holding under each arm, and then her right leg gave out. She sat down on bed. She states her leg "just gave out." She began tremoring upon being repositioned back in bed. She denies pain once repositioned. Will continue to closely monitor. Richarda Blade RN

## 2013-10-10 LAB — RENAL FUNCTION PANEL
ALBUMIN: 1.9 g/dL — AB (ref 3.5–5.2)
Albumin: 1.9 g/dL — ABNORMAL LOW (ref 3.5–5.2)
BUN: 21 mg/dL (ref 6–23)
BUN: 22 mg/dL (ref 6–23)
CALCIUM: 8.4 mg/dL (ref 8.4–10.5)
CALCIUM: 8 mg/dL — AB (ref 8.4–10.5)
CHLORIDE: 98 meq/L (ref 96–112)
CO2: 25 meq/L (ref 19–32)
CO2: 26 meq/L (ref 19–32)
CREATININE: 2.12 mg/dL — AB (ref 0.50–1.10)
Chloride: 100 mEq/L (ref 96–112)
Creatinine, Ser: 2.21 mg/dL — ABNORMAL HIGH (ref 0.50–1.10)
GFR calc Af Amer: 31 mL/min — ABNORMAL LOW (ref >90)
GFR calc Af Amer: 30 mL/min — ABNORMAL LOW (ref >90)
GFR, EST NON AFRICAN AMERICAN: 27 mL/min — AB (ref >90)
GFR, EST NON AFRICAN AMERICAN: 26 mL/min — AB (ref >90)
Glucose, Bld: 96 mg/dL (ref 70–99)
Glucose, Bld: 181 mg/dL — ABNORMAL HIGH (ref 70–99)
PHOSPHORUS: 2.5 mg/dL (ref 2.3–4.6)
Phosphorus: 2.5 mg/dL (ref 2.3–4.6)
Potassium: 4.8 mEq/L (ref 3.7–5.3)
Potassium: 4.9 mEq/L (ref 3.7–5.3)
Sodium: 137 mEq/L (ref 137–147)
Sodium: 134 mEq/L — ABNORMAL LOW (ref 137–147)

## 2013-10-10 LAB — VANCOMYCIN, TROUGH: Vancomycin Tr: 21.5 ug/mL — ABNORMAL HIGH (ref 10.0–20.0)

## 2013-10-10 LAB — GLUCOSE, CAPILLARY
GLUCOSE-CAPILLARY: 138 mg/dL — AB (ref 70–99)
GLUCOSE-CAPILLARY: 191 mg/dL — AB (ref 70–99)
Glucose-Capillary: 88 mg/dL (ref 70–99)
Glucose-Capillary: 178 mg/dL — ABNORMAL HIGH (ref 70–99)

## 2013-10-10 LAB — TYPE AND SCREEN
ABO/RH(D): O POS
Antibody Screen: NEGATIVE
Unit division: 0

## 2013-10-10 LAB — CARBOXYHEMOGLOBIN
CARBOXYHEMOGLOBIN: 2.2 % — AB (ref 0.5–1.5)
Methemoglobin: 0.9 % (ref 0.0–1.5)
O2 Saturation: 63.5 %
TOTAL HEMOGLOBIN: 7.8 g/dL — AB (ref 12.0–16.0)

## 2013-10-10 LAB — CBC
HCT: 27 % — ABNORMAL LOW (ref 36.0–46.0)
Hemoglobin: 8.1 g/dL — ABNORMAL LOW (ref 12.0–15.0)
MCH: 28.5 pg (ref 26.0–34.0)
MCHC: 30 g/dL (ref 30.0–36.0)
MCV: 95.1 fL (ref 78.0–100.0)
PLATELETS: 270 10*3/uL (ref 150–400)
RBC: 2.84 MIL/uL — ABNORMAL LOW (ref 3.87–5.11)
RDW: 18 % — AB (ref 11.5–15.5)
WBC: 5.6 10*3/uL (ref 4.0–10.5)

## 2013-10-10 LAB — PROTIME-INR
INR: 2.55 — ABNORMAL HIGH (ref 0.00–1.49)
Prothrombin Time: 26.6 seconds — ABNORMAL HIGH (ref 11.6–15.2)

## 2013-10-10 LAB — APTT: aPTT: 80 seconds — ABNORMAL HIGH (ref 24–37)

## 2013-10-10 LAB — MAGNESIUM: Magnesium: 2.4 mg/dL (ref 1.5–2.5)

## 2013-10-10 MED ORDER — DOBUTAMINE IN D5W 4-5 MG/ML-% IV SOLN
3.0000 ug/kg/min | INTRAVENOUS | Status: DC
  Administered 2013-10-12 – 2013-10-13 (×2): 4 ug/kg/min via INTRAVENOUS
  Administered 2013-10-15 – 2013-10-17 (×2): 3 ug/kg/min via INTRAVENOUS
  Administered 2013-10-19: 2.5 ug/kg/min via INTRAVENOUS
  Administered 2013-10-23: 1 ug/kg/min via INTRAVENOUS
  Filled 2013-10-10 (×8): qty 250

## 2013-10-10 MED ORDER — WARFARIN SODIUM 10 MG PO TABS
10.0000 mg | ORAL_TABLET | Freq: Once | ORAL | Status: AC
  Administered 2013-10-10: 10 mg via ORAL
  Filled 2013-10-10: qty 1

## 2013-10-10 MED ORDER — VANCOMYCIN HCL 500 MG IV SOLR
500.0000 mg | INTRAVENOUS | Status: AC
  Administered 2013-10-11: 500 mg via INTRAVENOUS
  Filled 2013-10-10: qty 500

## 2013-10-10 MED ORDER — LEVETIRACETAM 500 MG PO TABS
500.0000 mg | ORAL_TABLET | Freq: Two times a day (BID) | ORAL | Status: DC
  Administered 2013-10-10 – 2013-10-11 (×4): 500 mg via ORAL
  Filled 2013-10-10 (×6): qty 1

## 2013-10-10 NOTE — Progress Notes (Signed)
Wounds examined today  Right leg fasciotomy site open further today with standing  PE:  Right groin looks better today.  There is  More healthy granulation tissue.  Still some necrotic fat, but overall improved.  No evidence of infection  LE fasciotomy site clean  Plan R Groin:  con't wound vac Fasciotomy:  Con't dressing changes   Hannah [name]

## 2013-10-10 NOTE — Progress Notes (Signed)
Background 43 yo Hannah [name] with  DM, advanced CKD 5 at baseline.  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 Developed cardiogenic shock, cold foot after IABP requiring femoral thrombectomy and fasciotomy (6/1), VT/VF then PEA arrest 6/3. She required initiation of RRT 09/06/13 and has remained dialysis dependent.  Short trial of IHD unsuccessful and back to CRRT as of 6/4 with  inotrope requirement and inability to wean.  Assessment/Recommendations  1. Dialysis dependent anuric AoCKD5, now ESRD, have cont CRRT (Dialysis dependent since 5/21; current access is left TDC placed 5/27;  5/21 CRRT->IHD 5/26 ->back to CRRT 6/4 ; weight down 131->nadir of 113.6 with CRRT->allowed to drift back up some to see if could maintain BP. Running even feeling bloated, changed to pulling 25-50/hour.  Will challenge a little more today per cards- will try for cvp closer to 10.  Weight today i do not think is right. As previously stated will have to be able to maintain BP off inotropes in order to transition to intermittent HD. Like Dr. Jeffie Pollock, I am not sure this will be possible... With current open groin wound, placement of PD catheter would carry great risk of infection due to proximity of the groin wound so possibility MAYBE after wound healed/infection cleared... 2. Hypophosphatemia - intermittent replacement 3. Cardiogenic shock, Ischemic CM  and not a CABG candidate; EF 22% by myoview; 30-35 by echo as per #1; on dobutamine,midodrine and florinef-  NE off .   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 S/p R femoral endarterectomy wound complications- wound VAC in place; maybe a little more dehiscence today. On vanco and meropenam with VAC changes per VVS.  6. CAD, ischemic CM 7. Anemia - on darbe 200/week;  iron studies appear "adequate" ; s/p 2 units for Hb of 6.9 (6/19)- back on anticoagulation 8. Twitching/myoclonus - ATB changed ,  now starting keppra per neurology 9. Lung CA with prior LL Lobectomy 10. Right axillary DVT 11. Bilateral carotid stenosis 12. Diabetes - insulin 13. Deconditioning- PT   Subjective: BP is stable- dobut weaned some yesterday Bloating better and twitching worse Low UF- weight up but dont think is right   Objective: Vital signs in last 24 hours: Temp:  [Hannah.2 F (36.2 C)-98.3 F (36.8 C)] 98.3 F (36.8 C) (06/23 2355) Pulse Rate:  [59-123] 110 (06/24 0700) Resp:  [10-27] 10 (06/24 0700) BP: (54-144)/(22-104) 113/60 mmHg (06/24 0700) SpO2:  [90 %-100 %] 96 % (06/24 0700) Weight:  [121.5 kg (267 lb 13.7 oz)] 121.5 kg (267 lb 13.7 oz) (06/24 0650) Weight change: 5 kg (11 lb 0.4 oz)  Current CRRT Prescription:  Start Date: restarted 09/20/13  Catheter: TDC left BFR: 200  Pre Blood Pump: 300 DFR: 1500  Replacement Rate: 200  Goal UF      25-50 off  Per hour    Anticoagulation: argatroban systemic, clotted on heparin, +HIT  Clotting: not excessive  WEIGHT TRENDING 10/09/13 116.5 10/08/13 119.5 10/07/13 0630 119.8 kg  10/06/13 0700 117.9 kg 10/05/13 0500 117.2 kg (repeated - same) 10/04/13 0500 113.9 kg CRRT changed to "keep volume even" fluid removal goal 10/03/13 0500 113.6 kg  10/02/13 0500 114.5 kg  10/01/13 0648 114.6 kg  09/30/13 0500 117.6 kg  09/29/13 0600 121.7 kg  09/28/13 0600 123.8 kg 09/27/13 0500 127.2 kg  09/26/13 0500 130.9 kg  09/25/13 0500 133.5 kg 09/24/13 0600 134.5 kg  09/23/13 0700 132.8 kg  09/22/13 0500 131.1 kg  09/21/13 0500 129.6 kg  09/20/13 0500 131 kg   CRRT restarted -50-100/hour  PHYSICAL EXAMINATION General appearance: alert and cooperative, sitting up in bed, NAD BP 113/60  Pulse 110  Temp(Src) 98.3 F (36.8 C) (Oral)  Resp 10  Ht 5' 7.5" (1.715 m)  Wt 121.5 kg (267 lb 13.7 oz)  BMI 41.31 kg/m2  SpO2 96%  LMP 08/19/2013 CVP up to 20 acutely ? Alert,oriented, pleasant and up in the chair Central line R and TDC L Lungs clear  to auscultation bilaterally anteriorly   [facility] dressing dry and intact left chest Cardio: regular rate and rhythm S1, S2 normal, no murmur, click, rub or gallop GI: soft, non-tender; bowel sounds normal Extremities: 1+ edema to thighs Wound VAC in place R Groin; stapled incisions clean and dry on LE's Dusky tips of toes right foot #3-5 unchanged  Intake/Output from previous day: 06/14 0701 - 06/15 0700 In: 998.1 [I.V.:623.1; IV Piggyback:375] Out: 5433 [Drains:100]  Recent Labs  10/09/13 0414 10/09/13 1600 10/10/13 0322  NA 135* 137 137  K 4.9 5.1 4.8  CL 98 101 100  CO2 27 27 25   GLUCOSE 188* 157* 96  BUN 18 20 21   CREATININE 1.Hannah* 2.01* 2.12*  CALCIUM 8.4 8.1* 8.4  MG 2.5  --  2.4  PHOS 2.8 2.5 2.5    Recent Labs  10/09/13 1600 10/10/13 0322  ALBUMIN 2.0* 1.9*    Recent Labs  10/09/13 0414 10/10/13 0322  WBC 6.0 5.6  HGB 7.6* 8.1*  HCT 25.0* 27.0*  MCV Hannah.3 95.1  PLT 240 270   Results for STELLA, BORTLE (MRN [id number]) as of 10/02/2013 09:13  Ref. Range 10/01/2013 08:30  Iron Latest Range: 42-135 ug/dL 45  UIBC Latest Range: 125-400 ug/dL 164  TIBC Latest Range: 250-470 ug/dL 209 (L)  Saturation Ratios Latest Range: 20-55 % 22  Ferritin Latest Range: 10-291 ng/mL 399 (H)    Scheduled Medications: . aspirin  81 mg Oral Daily  . atorvastatin  80 mg Oral q1800  . calcitRIOL  0.25 mcg Oral Daily  . darbepoetin (ARANESP) injection - NON-DIALYSIS  200 mcg Subcutaneous Q Wed-1800  . feeding supplement (NEPRO CARB STEADY)  237 mL Oral TID WC  . fludrocortisone  0.1 mg Oral Daily  . insulin aspart  0-15 Units Subcutaneous TID WC  . insulin aspart  0-5 Units Subcutaneous QHS  . insulin aspart  6 Units Subcutaneous TID WC  . insulin glargine  40 Units Subcutaneous Q24H  . levETIRAcetam  500 mg Oral BID  . lidocaine (PF)  5 mL Other Once  . meropenem (MERREM) IV  1 g Intravenous Q12H  . midodrine  10 mg Oral TID WC  . pantoprazole  40 mg Oral Daily  .  polyethylene glycol  17 g Oral Daily  . vancomycin  750 mg Intravenous Q24H  . Warfarin - Pharmacist Dosing Inpatient   Does not apply q1800  Infusion Medicications . sodium chloride Stopped (10/01/13 2000)  . argatroban 0.5 mcg/kg/min (10/10/13 0700)  . DOBUTamine 4 mcg/kg/min (10/10/13 0700)  . dialysis replacement fluid (prismasate) 300 mL/hr at 10/09/13 2318  . dialysis replacement fluid (prismasate) 200 mL/hr at 10/09/13 0657  . dialysate (PRISMASATE) 1,500 mL/hr at 10/10/13 0516    LOS: 37 days   Amaurie Schreckengost A 10/10/2013,7:59 AM

## 2013-10-10 NOTE — Progress Notes (Signed)
Patient ID: Hannah [name], female   DOB: [birth year], 43 y.o.   MRN: [id number]  ADVANCED HEART FAILURE ROUNDING NOTE   SUBJECTIVE  43 yo female with PMH of DM1, stage V kidney disease, blindness, h/o lung CA s/p resection who was recently discharged for heart failure symptom had a high risk stress test which showed ischemia in anterior and apical region. She underwent cath 09/04/2013 which showed chronically occluded RCA and triple vessel dx. CT surgery consulted but not felt to be surgical candidate due to poor PFTs, suboptimal targets, comorbidities and shock. EF 22% by Myoview. 30-35% by echo  Underwent placement of swan and IABP for shock (co-ox 38%). Unfortunately developed cold foot and IABP had to be removed.Underwent R femoral thrombectomy and 4 compartment fasciotomy of RLE on 6/1.On 6/3 had VT/VF->PEA arrest 10 mins CPR. U/s shows R axillary DVT  Myoclonus getting worse. Having hard time speaking and standing. Today nearly fell and further dehisced leg wound. Weight going back up. Dobutamine down to 4. Co-ox 63%. SBP stable.    CURRENT MEDS . aspirin  81 mg Oral Daily  . atorvastatin  80 mg Oral q1800  . calcitRIOL  0.25 mcg Oral Daily  . darbepoetin (ARANESP) injection - NON-DIALYSIS  200 mcg Subcutaneous Q Wed-1800  . feeding supplement (NEPRO CARB STEADY)  237 mL Oral TID WC  . fludrocortisone  0.1 mg Oral Daily  . insulin aspart  0-15 Units Subcutaneous TID WC  . insulin aspart  0-5 Units Subcutaneous QHS  . insulin aspart  6 Units Subcutaneous TID WC  . insulin glargine  40 Units Subcutaneous Q24H  . lidocaine (PF)  5 mL Other Once  . meropenem (MERREM) IV  1 g Intravenous Q12H  . midodrine  10 mg Oral TID WC  . pantoprazole  40 mg Oral Daily  . polyethylene glycol  17 g Oral Daily  . vancomycin  750 mg Intravenous Q24H  . Warfarin - Pharmacist Dosing Inpatient   Does not apply q1800    OBJECTIVE  Filed Vitals:   10/10/13 0600 10/10/13 0630 10/10/13 0650 10/10/13  0700  BP: 107/41 90/63 121/62 113/60  Pulse: 112 110 115 110  Temp:      TempSrc:      Resp: 18 14 17 10   Height:      Weight:   121.5 kg (267 lb 13.7 oz)   SpO2: 100% 99% 100% 96%    Intake/Output Summary (Last 24 hours) at 10/10/13 0743 Last data filed at 10/10/13 0700  Gross per 24 hour  Intake 1655.68 ml  Output   2244 ml  Net -588.32 ml   Filed Weights   10/08/13 0700 10/09/13 0615 10/10/13 0650  Weight: 119.5 kg (263 lb 7.2 oz) 116.5 kg (256 lb 13.4 oz) 121.5 kg (267 lb 13.7 oz)    PHYSICAL EXAM  CVP 12 General: Sitting up in chair.  NAD Neuro: Alert and oriented X 3. Moves all extremities spontaneous HEENT:  Normal  Neck: Supple without bruits. Chest L subclav PC. R subclav TLC. Unable to see JVD Lungs:  clear Heart: tachy regular. +s3 Abdomen: Soft, non-tender, non-distended, BS + x 4.  Extremities: 1+ edema bilateral ankles. Dressing on RLE. Wound vac in R groin   CBC  Recent Labs  10/09/13 0414 10/10/13 0322  WBC 6.0 5.6  HGB 7.6* 8.1*  HCT 25.0* 27.0*  MCV 97.3 95.1  PLT 240 662   Basic Metabolic Panel  Recent Labs  10/09/13 0414 10/09/13 1600  10/10/13 0322  NA 135* 137 137  K 4.9 5.1 4.8  CL 98 101 100  CO2 27 27 25   GLUCOSE 188* 157* 96  BUN 18 20 21   CREATININE 1.97* 2.01* 2.12*  CALCIUM 8.4 8.1* 8.4  MG 2.5  --  2.4  PHOS 2.8 2.5 2.5   Liver Function Tests  Recent Labs  10/09/13 1600 10/10/13 0322  ALBUMIN 2.0* 1.9*   Cardiac Enzymes No results found for this basename: CKTOTAL, CKMB, CKMBINDEX, TROPONINI,  in the last 72 hours Thyroid Function Tests No results found for this basename: TSH, T4TOTAL, FREET3, T3FREE, THYROIDAB,  in the last 72 hours  TELE  Sinus tachycardia 100-110   Radiology/Studies  Dg Chest 2 View  09/06/2013   CLINICAL DATA:  CHF, intermittent dyspnea  EXAM: CHEST  2 VIEW  COMPARISON:  Prior chest x-ray 08/13/2013; prior chest CT 08/22/2013  FINDINGS: Stable cardiac and mediastinal contours.  Atherosclerotic calcifications are present within the transverse aorta. Moderate left and small right pleural effusions are similar compared to prior. There is persistent associated bibasilar atelectasis. Mild pulmonary vascular congestion without overt edema. No pneumothorax. No new focal airspace consolidation. No acute osseous abnormality.  IMPRESSION: 1. Stable moderate left and small right pleural effusions and associated bibasilar atelectasis. 2. Pulmonary vascular congestion without overt edema.   Electronically Signed   By: Jacqulynn Cadet M.D.   On: 09/06/2013 07:55   Ct Chest Wo Contrast  08/22/2013   CLINICAL DATA:  Left lung cancer status post lobectomy. Weight gain. Renal insufficiency.  EXAM: CT CHEST WITHOUT CONTRAST  TECHNIQUE: Multidetector CT imaging of the chest was performed following the standard protocol without IV contrast.  COMPARISON:  Radiographs 08/13/2013 and 08/04/2013.  CT 08/02/2012.  FINDINGS: There are stable postsurgical changes related to prior left lower lobe resection. There has been interval enlargement of several mediastinal lymph nodes. These include 12 mm right paratracheal (image 19), 13 mm precarinal (image 23), and 14 mm AP window (image 25) lymph nodes. Some of these nodes have retained fatty hila. Allowing for the limitations of noncontrast technique, the hila appear stable.  There is stable low-density within the right thyroid lobe. Atherosclerosis of the aorta, great vessels and coronary arteries is noted. The heart size is normal. There is no pericardial effusion.  Moderate size dependent pleural effusions are present bilaterally. There is associated dependent airspace disease in both lower lobes. In addition, there are more focal airspace opacities within the right lower lobe and inferior aspect of the left upper lobe. The latter is somewhat nodular, measuring up to 1.8 cm on image 43. No other focal nodularity or endobronchial lesions are demonstrated.  The  visualized upper abdomen has a stable appearance. There is no adrenal mass. There is increased subcutaneous edema throughout the subcutaneous fat, especially within the anterior aspect of the upper abdomen.  No worrisome osseous findings are demonstrated.  IMPRESSION: 1. As demonstrated radiographically, there are bilateral pleural effusions and bilateral airspace opacities which are new compared with the prior CT. Although there are focal somewhat nodular components in the right lower and left upper lobes, these findings are most likely infectious/inflammatory. 2. Progressive mediastinal lymphadenopathy. Some of the nodes have retained fatty hila and may be reactive. Metastatic disease cannot be completely excluded. 3. Stable atherosclerosis and low-density within the right thyroid lobe. 4. If the airspace opacities and pleural effusions fail to respond to appropriate clinical therapy, follow-up CT or PET-CT may be warranted to exclude metastatic disease.  Electronically Signed   By: Camie Patience M.D.   On: 08/22/2013 10:26   Dg Chest Portable 1 View  08/13/2013   CLINICAL DATA:  Shortness of breath increasing for 2 weeks. History of CHF.  EXAM: PORTABLE CHEST - 1 VIEW  COMPARISON:  08/04/2013  FINDINGS: Again noted are bibasilar lung densities that are suggestive for pleural effusions and atelectasis. Heart size appears to be enlarged. There is some peribronchial thickening and cannot exclude pulmonary edema.  IMPRESSION: Bilateral pleural effusions with basilar atelectasis. There is mild pulmonary edema. Minimal change from the previous examination.   Electronically Signed   By: Markus Daft M.D.   On: 08/13/2013 23:10    ASSESSMENT AND PLAN 1. Cardiogenic shock 2. Acute on chronic systolic HF     EF ~30-09% 3. 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 Acute respiratory failure 5. 3-V CAD, severe.  6. Carotid stenosis     - doppler 08/31/2013 shows R 60-79% ICA stenosis, L 80-99% ICA stenosis     - vascular surgery  aware 7. 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 DM1 9. Ischemic R foot s/p R femoral thrombectomy and 4-compartment fasciotomy.  10. VT/VF arrest 6/3 11. R axillary DVT 12. Severe protein calorie malnutrition  13. Large right groin wound s/p debridement 6/12 14. Anemia: Transfuse hemoglobin < 7.  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 Will drop dobutamine to 3 and ask Renal if they can try to pull a little harder and see if she can tolerate it. Continue midodrine.   Myoclonus and wound healing becoming increasingly challenging. I spoke to Neuro this am and the recommend Keppra for myoclonus - will start,  Await VVS plan for wounds. Continue abx for now. She has been started on coumadin, will need overlap with argatroban given HIT- goal INR 4.0 during overlap.   Continue medical management of CAD. Long-term management of HF is problematic as she cannot tolerate any HF meds.   Hannah Stagner,MD 7:43 AM

## 2013-10-10 NOTE — Progress Notes (Signed)
ANTIBIOTIC CONSULT NOTE - FOLLOW UP  Pharmacy Consult for Vancomycin  Indication: Infected groin wound  Vital Signs: Temp: 97.7 F (36.5 C) (06/24 1959) Temp src: Oral (06/24 1959) BP: 80/49 mmHg (06/24 2130) Pulse Rate: 109 (06/24 2200)  Labs:  Recent Labs  10/08/13 1420  10/09/13 0414 10/09/13 1600 10/10/13 0322 10/10/13 1600  WBC 5.2  --  6.0  --  5.6  --   HGB 8.1*  --  7.6*  --  8.1*  --   PLT 226  --  240  --  270  --   CREATININE  --   < > 1.97* 2.01* 2.12* 2.21*  < > = values in this interval not displayed.   Recent Labs  10/10/13 2129  VANCOTROUGH 21.5*     Assessment: VT 21.5, on CRRT, drawn correctly, WBC wnl, possibly transition to iHD when able to maintain BP off inotropes  Goal of Therapy:  Vancomycin trough level 15-20 mcg/ml  Plan:  -Decrease vancomycin to 500 mg IV q24h -Trend WBC, temp, plans for iHD -Repeat drug levels as indicated   Narda Bonds 10/10/2013,11:09 PM

## 2013-10-10 NOTE — Progress Notes (Signed)
While standing to get patient's weight this morning with 3 RN assist, patient's leg spasmed and she was unable to support her weight. Patient was assisted back to bed. Once back in bed, right lower leg noted to be bleeding. Took down dressing. Medial incision site further dehised, with bleeding. Patted site dry and applied wet to dry dressing and wrapped with kerlex. Bed weight obtained. Richarda Blade RN

## 2013-10-10 NOTE — Progress Notes (Signed)
ANTICOAGULATION CONSULT NOTE - Follow Up Consult  Pharmacy Consult for Argatroban and Coumadin Indication: Thromboembolic state (RUE DVT)  Allergies  Allergen Reactions  . Crestor [Rosuvastatin] Other (See Comments)    Severe muscle weakness  . Nsaids Other (See Comments)    Not allergic, "bad on my kidneys"  . Ciprofloxacin Rash    Patient Measurements: Height: 5' 7.5" (171.5 cm) Weight: 267 lb 13.7 oz (121.5 kg) (patient unable to remain standing for weight) IBW/kg (Calculated) : 62.75  Vital Signs: Temp: 97.4 F (36.3 C) (06/24 0811) Temp src: Oral (06/24 0811) BP: 88/39 mmHg (06/24 0845) Pulse Rate: 33 (06/24 0845)  Labs:  Recent Labs  10/08/13 0345 10/08/13 1420  10/09/13 0414 10/09/13 1600 10/10/13 0322  HGB 7.5* 8.1*  --  7.6*  --  8.1*  HCT 24.4* 26.4*  --  25.0*  --  27.0*  PLT 238 226  --  240  --  270  APTT 60*  --   --  53*  --  80*  LABPROT 21.2*  --   --  20.0*  --  26.6*  INR 1.90*  --   --  1.76*  --  2.55*  CREATININE 1.98*  --   < > 1.97* 2.01* 2.12*  < > = values in this interval not displayed.  Estimated Creatinine Clearance: 45.7 ml/min (by C-G formula based on Cr of 2.12).   Medications:  Argatroban @ 0.5 mcg/kg/min  Assessment: 43yof continues on argatroban with a therapeutic aPTT. Coumadin was on hold for I&D/wound vac of the right groin, but was resumed 6/20. Argatroban can falsely elevate the INR - consider 1.77 to be her 'baseline' while on argratroban. INR has finally started to trend up with increasing coumadin doses. Hgb improved after blood given yesterday, platelets stable. Right groin with minimal bleeding.  Goal of Therapy: INR ~4 while on argatroban APTT 50-90 seconds Monitor platelets by anticoagulation protocol: Yes   Plan:  1) Continue argatroban @ 0.42mcg/kg/min 2) Repeat coumadin 10mg  x 1 3) INR, aPTT, CBC in AM  Deboraha Sprang 10/10/2013,9:36 AM

## 2013-10-11 DIAGNOSIS — G253 Myoclonus: Secondary | ICD-10-CM

## 2013-10-11 LAB — CBC
HCT: 25.9 % — ABNORMAL LOW (ref 36.0–46.0)
Hemoglobin: 7.9 g/dL — ABNORMAL LOW (ref 12.0–15.0)
MCH: 29.2 pg (ref 26.0–34.0)
MCHC: 30.5 g/dL (ref 30.0–36.0)
MCV: 95.6 fL (ref 78.0–100.0)
Platelets: 241 10*3/uL (ref 150–400)
RBC: 2.71 MIL/uL — ABNORMAL LOW (ref 3.87–5.11)
RDW: 17.8 % — ABNORMAL HIGH (ref 11.5–15.5)
WBC: 5.3 10*3/uL (ref 4.0–10.5)

## 2013-10-11 LAB — GLUCOSE, CAPILLARY
GLUCOSE-CAPILLARY: 167 mg/dL — AB (ref 70–99)
GLUCOSE-CAPILLARY: 157 mg/dL — AB (ref 70–99)
GLUCOSE-CAPILLARY: 166 mg/dL — AB (ref 70–99)
GLUCOSE-CAPILLARY: 116 mg/dL — AB (ref 70–99)
Glucose-Capillary: 98 mg/dL (ref 70–99)
Glucose-Capillary: 49 mg/dL — ABNORMAL LOW (ref 70–99)
Glucose-Capillary: 53 mg/dL — ABNORMAL LOW (ref 70–99)
Glucose-Capillary: 116 mg/dL — ABNORMAL HIGH (ref 70–99)

## 2013-10-11 LAB — RENAL FUNCTION PANEL
ALBUMIN: 2 g/dL — AB (ref 3.5–5.2)
Albumin: 1.9 g/dL — ABNORMAL LOW (ref 3.5–5.2)
BUN: 21 mg/dL (ref 6–23)
BUN: 23 mg/dL (ref 6–23)
CALCIUM: 8.1 mg/dL — AB (ref 8.4–10.5)
CHLORIDE: 98 meq/L (ref 96–112)
CO2: 26 mEq/L (ref 19–32)
CO2: 26 mEq/L (ref 19–32)
CREATININE: 2.26 mg/dL — AB (ref 0.50–1.10)
CREATININE: 2.29 mg/dL — AB (ref 0.50–1.10)
Calcium: 7.8 mg/dL — ABNORMAL LOW (ref 8.4–10.5)
Chloride: 101 mEq/L (ref 96–112)
GFR calc Af Amer: 29 mL/min — ABNORMAL LOW (ref >90)
GFR calc Af Amer: 29 mL/min — ABNORMAL LOW (ref >90)
GFR calc non Af Amer: 25 mL/min — ABNORMAL LOW (ref >90)
GFR calc non Af Amer: 25 mL/min — ABNORMAL LOW (ref >90)
GLUCOSE: 148 mg/dL — AB (ref 70–99)
Glucose, Bld: 178 mg/dL — ABNORMAL HIGH (ref 70–99)
PHOSPHORUS: 2.4 mg/dL (ref 2.3–4.6)
POTASSIUM: 5.2 meq/L (ref 3.7–5.3)
Phosphorus: 2.6 mg/dL (ref 2.3–4.6)
Potassium: 5 mEq/L (ref 3.7–5.3)
Sodium: 136 mEq/L — ABNORMAL LOW (ref 137–147)
Sodium: 135 mEq/L — ABNORMAL LOW (ref 137–147)

## 2013-10-11 LAB — APTT: aPTT: 75 seconds — ABNORMAL HIGH (ref 24–37)

## 2013-10-11 LAB — CARBOXYHEMOGLOBIN
Carboxyhemoglobin: 1.6 % — ABNORMAL HIGH (ref 0.5–1.5)
Carboxyhemoglobin: 2 % — ABNORMAL HIGH (ref 0.5–1.5)
Methemoglobin: 0.6 % (ref 0.0–1.5)
Methemoglobin: 0.7 % (ref 0.0–1.5)
O2 SAT: 45.8 %
O2 Saturation: 48 %
TOTAL HEMOGLOBIN: 13.8 g/dL (ref 12.0–16.0)
TOTAL HEMOGLOBIN: 8.8 g/dL — AB (ref 12.0–16.0)

## 2013-10-11 LAB — PROTIME-INR
INR: 4.15 — AB (ref 0.00–1.49)
INR: 3.02 — AB (ref 0.00–1.49)
PROTHROMBIN TIME: 31.3 s — AB (ref 11.6–15.2)
Prothrombin Time: 40.1 seconds — ABNORMAL HIGH (ref 11.6–15.2)

## 2013-10-11 LAB — MAGNESIUM: Magnesium: 2.5 mg/dL (ref 1.5–2.5)

## 2013-10-11 MED ORDER — WARFARIN SODIUM 2.5 MG PO TABS
2.5000 mg | ORAL_TABLET | Freq: Once | ORAL | Status: AC
  Administered 2013-10-11: 2.5 mg via ORAL
  Filled 2013-10-11: qty 1

## 2013-10-11 NOTE — Progress Notes (Signed)
Physical Therapy Treatment Patient Details Name: Hannah [name] MRN: [id number] DOB: 1970/12/19 Today's Date: 2013/11/01    History of Present Illness 43 yo WF with  DM, advanced CKD 5 at baseline.  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 Developed cardiogenic shock, cold foot after IABP requiring femoral thrombectomy and fasciotomy (6/1), VT/VF then PEA arrest 6/3. She required initiation of RRT 09/06/13 and has remained dialysis dependent.  Short trial of IHD unsuccessful and back to CRRT as of 6/4 with  inotrope requirement and inability to wean    PT Comments    Pt with less myoclonic jerks today.  Follow Up Recommendations  LTACH     Equipment Recommendations  Other (comment) (to be determined)    Recommendations for Other Services       Precautions / Restrictions Precautions Precautions: Fall Precaution Comments: On CVVHD, multiple lines/tubes    Mobility  Bed Mobility               General bed mobility comments: Pt in chair  Transfers   Equipment used: Rolling walker (2 wheeled) Transfers: Sit to/from Stand Sit to Stand: +2 physical assistance;Max assist         General transfer comment: Required 3 attempts to achieve standing from the chair. Heavy assist to bring hips up.  Ambulation/Gait                 Stairs            Wheelchair Mobility    Modified Rankin (Stroke Patients Only)       Balance           Standing balance support: Bilateral upper extremity supported Standing balance-Leahy Scale: Poor Standing balance comment: Stood with walker x 4 minutes with +2 min A. No myoclonic jerks while standing.                    Cognition Arousal/Alertness: Awake/alert Behavior During Therapy: WFL for tasks assessed/performed Overall Cognitive Status: Within Functional Limits for tasks assessed                      Exercises      General Comments         Pertinent Vitals/Pain VSS    Home Living                      Prior Function            PT Goals (current goals can now be found in the care plan section) Progress towards PT goals: Progressing toward goals    Frequency  Min 2X/week    PT Plan Discharge plan needs to be updated    Co-evaluation             End of Session Equipment Utilized During Treatment: Gait belt Activity Tolerance: Patient limited by fatigue Patient left: in chair;with call bell/phone within reach;with nursing/sitter in room     Time: 1411-1427 PT Time Calculation (min): 16 min  Charges:  $Gait Training: 8-22 mins                    G Codes:      MAYCOCK,CARY 11/01/2013, 3:33 PM  [facility] [phone]

## 2013-10-11 NOTE — Progress Notes (Signed)
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 Developed cardiogenic shock, cold foot after IABP requiring femoral thrombectomy and fasciotomy (6/1), VT/VF then PEA arrest 6/3. She required initiation of RRT 09/06/13 and has remained dialysis dependent.  Short trial of IHD unsuccessful and back to CRRT as of 6/4 with  inotrope requirement and inability to wean.  Assessment/Recommendations  1. Dialysis dependent anuric AoCKD5, now ESRD, have cont CRRT (Dialysis dependent since 5/21; current access is left TDC placed 5/27;  5/21 CRRT->IHD 5/26 ->back to CRRT 6/4 ; weight down 131->nadir of 113.6 with CRRT->allowed to drift back up some -  pulling now 50-75/hour.   will try for cvp closer to 10.   As previously stated will have to be able to maintain BP off inotropes in order to transition to intermittent HD. Like Dr. Jeffie Pollock, I am not sure this will be possible... With current open groin wound, placement of PD catheter would carry great risk of infection due to proximity of the groin wound so possibility MAYBE after wound healed/infection cleared...  Will need to attempt another trial of IHD mabe even if cannot get dobutamine off- this weekend ??? 2. Hypophosphatemia - intermittent replacement 3. Cardiogenic shock, Ischemic CM  and not a CABG candidate; EF 22% by myoview; 30-35 by echo as per #1; on dobutamine,midodrine and florinef-  NE off .   Trying to wean dobutamine and so far successful but took a step back today 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 S/p R femoral endarterectomy wound complications- wound VAC in place; maybe a little more dehiscence today. On vanco and meropenam with VAC changes per VVS.  6. CAD, ischemic CM 7. Anemia - on darbe 200/week;  iron studies appear "adequate" ; s/p 2 units for Hb of 6.9 (6/19)- back on anticoagulation 8. Twitching/myoclonus - ATB changed , now starting  keppra per neurology- is worse- I cannot explain??? All elytes are OK 9. Lung CA with prior LL Lobectomy 10. Right axillary DVT 11. Bilateral carotid stenosis 12. Diabetes - insulin 13. Deconditioning- PT   Subjective: BP is stable- dobut went up She says she feels good today, less twitching  weight up but not sure is right- trying to UF a little more   Objective: Vital signs in last 24 hours: Temp:  [97.4 F (36.3 C)-98 F (36.7 C)] 98 F (36.7 C) (06/24 2352) Pulse Rate:  [33-122] 111 (06/25 0700) Resp:  [8-24] 16 (06/25 0700) BP: (80-132)/(29-73) 98/62 mmHg (06/25 0700) SpO2:  [76 %-100 %] 100 % (06/25 0700) Weight:  [121.3 kg (267 lb 6.7 oz)] 121.3 kg (267 lb 6.7 oz) (06/25 0600) Weight change: -0.2 kg (-7.1 oz)  Current CRRT Prescription:  Start Date: restarted 09/20/13  Catheter: TDC left BFR: 200  Pre Blood Pump: 300 DFR: 1500  Replacement Rate: 200  Goal UF      25-50 off  Per hour    Anticoagulation: argatroban systemic, clotted on heparin, +HIT  Clotting: not excessive  WEIGHT TRENDING 10/09/13 116.5 10/08/13 119.5 10/07/13 0630 119.8 kg  10/06/13 0700 117.9 kg 10/05/13 0500 117.2 kg (repeated - same) 10/04/13 0500 113.9 kg CRRT changed to "keep volume even" fluid removal goal 10/03/13 0500 113.6 kg  10/02/13 0500 114.5 kg  10/01/13 0648 114.6 kg  09/30/13 0500 117.6 kg  09/29/13 0600 121.7 kg  09/28/13 0600 123.8 kg 09/27/13 0500  127.2 kg  09/26/13 0500 130.9 kg  09/25/13 0500 133.5 kg 09/24/13 0600 134.5 kg  09/23/13 0700 132.8 kg  09/22/13 0500 131.1 kg  09/21/13 0500 129.6 kg  09/20/13 0500 131 kg   CRRT restarted -50-100/hour  PHYSICAL EXAMINATION General appearance: alert and cooperative, sitting up in bed, NAD BP 98/62  Pulse 111  Temp(Src) 98 F (36.7 C) (Oral)  Resp 16  Ht 5' 7.5" (1.715 m)  Wt 121.3 kg (267 lb 6.7 oz)  BMI 41.24 kg/m2  SpO2 100%  LMP 08/19/2013 CVP 10-13 Alert,oriented, pleasant  Central line R and TDC L Lungs  clear to auscultation bilaterally anteriorly   [facility] dressing dry and intact left chest Cardio: regular rate and rhythm S1, S2 normal, no murmur, click, rub or gallop GI: soft, non-tender; bowel sounds normal Extremities: 1+ edema to thighs Wound VAC in place R Groin; stapled incisions clean and dry on LE's Dusky tips of toes right foot #3-5 unchanged  Intake/Output from previous day: 06/14 0701 - 06/15 0700 In: 998.1 [I.V.:623.1; IV Piggyback:375] Out: 5433 [Drains:100]  Recent Labs  10/10/13 0322 10/10/13 1600 10/11/13 0400  NA 137 134* 136*  K 4.8 4.9 5.0  CL 100 98 101  CO2 25 26 26   GLUCOSE 96 181* 148*  BUN 21 22 21   CREATININE 2.12* 2.21* 2.26*  CALCIUM 8.4 8.0* 8.1*  MG 2.4  --  2.5  PHOS 2.5 2.5 2.6    Recent Labs  10/10/13 1600 10/11/13 0400  ALBUMIN 1.9* 1.9*    Recent Labs  10/10/13 0322 10/11/13 0400  WBC 5.6 5.3  HGB 8.1* 7.9*  HCT 27.0* 25.9*  MCV 95.1 95.6  PLT 270 241   Results for Hannah [name] (MRN [id number]) as of 10/02/2013 09:13  Ref. Range 10/01/2013 08:30  Iron Latest Range: 42-135 ug/dL 45  UIBC Latest Range: 125-400 ug/dL 164  TIBC Latest Range: 250-470 ug/dL 209 (L)  Saturation Ratios Latest Range: 20-55 % 22  Ferritin Latest Range: 10-291 ng/mL 399 (H)    Scheduled Medications: . aspirin  81 mg Oral Daily  . atorvastatin  80 mg Oral q1800  . calcitRIOL  0.25 mcg Oral Daily  . darbepoetin (ARANESP) injection - NON-DIALYSIS  200 mcg Subcutaneous Q Wed-1800  . feeding supplement (NEPRO CARB STEADY)  237 mL Oral TID WC  . fludrocortisone  0.1 mg Oral Daily  . insulin aspart  0-15 Units Subcutaneous TID WC  . insulin aspart  0-5 Units Subcutaneous QHS  . insulin aspart  6 Units Subcutaneous TID WC  . insulin glargine  40 Units Subcutaneous Q24H  . levETIRAcetam  500 mg Oral BID  . lidocaine (PF)  5 mL Other Once  . meropenem (MERREM) IV  1 g Intravenous Q12H  . midodrine  10 mg Oral TID WC  . pantoprazole  40 mg Oral Daily   . polyethylene glycol  17 g Oral Daily  . vancomycin  500 mg Intravenous Q24H  . Warfarin - Pharmacist Dosing Inpatient   Does not apply q1800  Infusion Medicications . sodium chloride Stopped (10/01/13 2000)  . argatroban 0.5 mcg/kg/min (10/11/13 0700)  . DOBUTamine 4 mcg/kg/min (10/11/13 0600)  . dialysis replacement fluid (prismasate) 300 mL/hr at 10/09/13 2318  . dialysis replacement fluid (prismasate) 200 mL/hr at 10/10/13 1218  . dialysate (PRISMASATE) 1,500 mL/hr at 10/11/13 0554    LOS: 38 days   Hannah [name] A 10/11/2013,7:47 AM

## 2013-10-11 NOTE — Progress Notes (Signed)
Patient's co-ox down to 45.8 this AM. Vital signs stable.  Dr. Elias Else with cardiology notified. Will increase Dobutamine from 52mcg up to 36mcg. Will recheck co-ox at 0800. A. Darice Vicario RN

## 2013-10-11 NOTE — Progress Notes (Signed)
Hypoglycemic Event  CBG: 53  Treatment: 15 GM carbohydrate snack  Symptoms: None  Follow-up CBG: Time:1215 CBG Result:116  Possible Reasons for Event: ? Excess meal coverage      Lorre Munroe  Remember to initiate Hypoglycemia Order Set & complete

## 2013-10-11 NOTE — Progress Notes (Addendum)
  [facility] Progress Note  10/11/2013 9:51 AM 13 Days Post-Op  Subjective:   No complaints  Afebrile 38'V-564 systolic HR 332'R-518'A regular 93% RA  Gtts: Dobutamine  ABx: Vanc Meropenem   Filed Vitals:   10/11/13 0900  BP: 93/67  Pulse: 110  Temp:   Resp: 16    Physical Exam: Incisions:  Right groin wound vac with good seal; right lateral and medial lower leg wounds with good granulation tissue Extremities:  + doppler signals bilateral DP; bilateral feet are warm  CBC    Component Value Date/Time   WBC 5.3 10/11/2013 0400   RBC 2.71* 10/11/2013 0400   HGB 7.9* 10/11/2013 0400   HCT 25.9* 10/11/2013 0400   PLT 241 10/11/2013 0400   MCV 95.6 10/11/2013 0400   MCH 29.2 10/11/2013 0400   MCHC 30.5 10/11/2013 0400   RDW 17.8* 10/11/2013 0400   LYMPHSABS 1.4 08/04/2013 1223   MONOABS 0.4 08/04/2013 1223   EOSABS 0.2 08/04/2013 1223   BASOSABS 0.1 08/04/2013 1223    BMET    Component Value Date/Time   NA 136* 10/11/2013 0400   K 5.0 10/11/2013 0400   CL 101 10/11/2013 0400   CO2 26 10/11/2013 0400   GLUCOSE 148* 10/11/2013 0400   BUN 21 10/11/2013 0400   CREATININE 2.26* 10/11/2013 0400   CREATININE 4.17* 07/06/2013 1618   CALCIUM 8.1* 10/11/2013 0400   GFRNONAA 25* 10/11/2013 0400   GFRNONAA 71 07/13/2011 0835   GFRAA 29* 10/11/2013 0400   GFRAA 82 07/13/2011 0835    INR    Component Value Date/Time   INR 4.15* 10/11/2013 0400     Intake/Output Summary (Last 24 hours) at 10/11/13 0951 Last data filed at 10/11/13 0900  Gross per 24 hour  Intake 1212.1 ml  Output   2352 ml  Net -1139.9 ml     Assessment:  43 y.o. female is s/p:  Right femoral endarterectomy, right femoral thrombectomy, four compartment fasciotomy 24 Days Post-Op and  Closure right leg fasciotomy 22 Days Post-Op And  I&D right groin with placement of wound vac 13 Days Post-Op   Plan: -continue wound vac to right groin -continue wet to dry dressing changes to  RLE -DVT prophylaxis:  Argatroban/Coumadin -still requiring dobutamine -CRRT with hope of transition to HD -possibly Select candidate when able to tolerate HD   Leontine Locket, PA-C [facility] [phone] 10/11/2013 9:51 AM

## 2013-10-11 NOTE — Progress Notes (Signed)
ANTICOAGULATION CONSULT NOTE - Follow Up Consult  Pharmacy Consult for Argatroban and Coumadin Indication: Thromboembolic state (RUE DVT)  Allergies  Allergen Reactions  . Crestor [Rosuvastatin] Other (See Comments)    Severe muscle weakness  . Nsaids Other (See Comments)    Not allergic, "bad on my kidneys"  . Ciprofloxacin Rash    Patient Measurements: Height: 5' 7.5" (171.5 cm) Weight: 267 lb 6.7 oz (121.3 kg) IBW/kg (Calculated) : 62.75  Vital Signs: Temp: 97.7 F (36.5 C) (06/25 1204) Temp src: Oral (06/25 1204) BP: 119/63 mmHg (06/25 1200) Pulse Rate: 107 (06/25 1200)  Labs:  Recent Labs  10/09/13 0414  10/10/13 0322 10/10/13 1600 10/11/13 0400 10/11/13 1215  HGB 7.6*  --  8.1*  --  7.9*  --   HCT 25.0*  --  27.0*  --  25.9*  --   PLT 240  --  270  --  241  --   APTT 53*  --  80*  --  75*  --   LABPROT 20.0*  --  26.6*  --  40.1* 31.3*  INR 1.76*  --  2.55*  --  4.15* 3.02*  CREATININE 1.97*  < > 2.12* 2.21* 2.26*  --   < > = values in this interval not displayed.  Estimated Creatinine Clearance: 42.8 ml/min (by C-G formula based on Cr of 2.26).   Medications:  Argatroban @ 0.5 mcg/kg/min  Assessment: 43yof continues on argatroban with a therapeutic aPTT. Coumadin was on hold for I&D/wound vac of the right groin, but was resumed 6/20. Argatroban can falsely elevate the INR - consider 1.77 to be her 'baseline' while on argratroban.   This morning's INR was 4.15, so we stopped argatroban for 4 hours and then rechecked the INR. Actual INR is 3. She has completed 5 days of overlap therapy (today is day #6). Sylvarena with Dr. Haroldine Laws to stop the argatroban. Will decrease today's coumadin dose as INR appears to be trending up quickly. Hgb low but stable, platelets stable. No bleeding reported.  Goal of Therapy: INR ~4 while on argatroban, 2-3 when argatroban off APTT 50-90 seconds Monitor platelets by anticoagulation protocol: Yes   Plan:  1) Stop  argatroban 2) Decrease coumadin to 2.5 mg x 1 3) INR, CBC in AM  Deboraha Sprang 10/11/2013,12:59 PM

## 2013-10-11 NOTE — Progress Notes (Signed)
Patient ID: Hannah [name], female   DOB: 1971-04-16, 43 y.o.   MRN: [id number]  ADVANCED HEART FAILURE ROUNDING NOTE   SUBJECTIVE  43 yo female with PMH of DM1, stage V kidney disease, blindness, h/o lung CA s/p resection who was recently discharged for heart failure symptom had a high risk stress test which showed ischemia in anterior and apical region. She underwent cath 09/04/2013 which showed chronically occluded RCA and triple vessel dx. CT surgery consulted but not felt to be surgical candidate due to poor PFTs, suboptimal targets, comorbidities and shock. EF 22% by Myoview. 30-35% by echo  Underwent placement of swan and IABP for shock (co-ox 38%). Unfortunately developed cold foot and IABP had to be removed.Underwent R femoral thrombectomy and 4 compartment fasciotomy of RLE on 6/1.On 6/3 had VT/VF->PEA arrest 10 mins CPR. U/s shows R axillary DVT  Myoclonus improving with Keppra. Had to go back up on dobutamine last night due to hypotension.  Weight stable. Co-ox 48% this am. Feels ok. Denies dyspnea or orthopnea.    CURRENT MEDS . aspirin  81 mg Oral Daily  . atorvastatin  80 mg Oral q1800  . calcitRIOL  0.25 mcg Oral Daily  . darbepoetin (ARANESP) injection - NON-DIALYSIS  200 mcg Subcutaneous Q Wed-1800  . feeding supplement (NEPRO CARB STEADY)  237 mL Oral TID WC  . fludrocortisone  0.1 mg Oral Daily  . insulin aspart  0-15 Units Subcutaneous TID WC  . insulin aspart  0-5 Units Subcutaneous QHS  . insulin aspart  6 Units Subcutaneous TID WC  . insulin glargine  40 Units Subcutaneous Q24H  . levETIRAcetam  500 mg Oral BID  . lidocaine (PF)  5 mL Other Once  . meropenem (MERREM) IV  1 g Intravenous Q12H  . midodrine  10 mg Oral TID WC  . pantoprazole  40 mg Oral Daily  . polyethylene glycol  17 g Oral Daily  . vancomycin  500 mg Intravenous Q24H  . Warfarin - Pharmacist Dosing Inpatient   Does not apply q1800    OBJECTIVE  Filed Vitals:   10/11/13 0700 10/11/13 0800  10/11/13 0804 10/11/13 0900  BP: 98/62 108/61  93/67  Pulse: 111 106  110  Temp:   97.5 F (36.4 C)   TempSrc:   Oral   Resp: 16 17  16   Height:      Weight:      SpO2: 100% 100%  93%    Intake/Output Summary (Last 24 hours) at 10/11/13 0929 Last data filed at 10/11/13 0900  Gross per 24 hour  Intake 1212.1 ml  Output   2352 ml  Net -1139.9 ml   Filed Weights   10/09/13 0615 10/10/13 0650 10/11/13 0600  Weight: 116.5 kg (256 lb 13.4 oz) 121.5 kg (267 lb 13.7 oz) 121.3 kg (267 lb 6.7 oz)    PHYSICAL EXAM  CVP 12 General: Lying in bed.  NAD Neuro: Alert and oriented X 3. Moves all extremities spontaneous HEENT:  Normal  Neck: Supple without bruits. Chest L subclav PC. R subclav TLC. Unable to see JVD Lungs:  clear Heart: tachy regular. +s3 Abdomen: Soft, non-tender, non-distended, BS + x 4.  Extremities: 1+ edema bilateral ankles. Dressing on RLE. Wound vac in R groin   CBC  Recent Labs  10/10/13 0322 10/11/13 0400  WBC 5.6 5.3  HGB 8.1* 7.9*  HCT 27.0* 25.9*  MCV 95.1 95.6  PLT 270 419   Basic Metabolic Panel  Recent Labs  10/10/13 0322 10/10/13 1600 10/11/13 0400  NA 137 134* 136*  K 4.8 4.9 5.0  CL 100 98 101  CO2 25 26 26   GLUCOSE 96 181* 148*  BUN 21 22 21   CREATININE 2.12* 2.21* 2.26*  CALCIUM 8.4 8.0* 8.1*  MG 2.4  --  2.5  PHOS 2.5 2.5 2.6   Liver Function Tests  Recent Labs  10/10/13 1600 10/11/13 0400  ALBUMIN 1.9* 1.9*   Cardiac Enzymes No results found for this basename: CKTOTAL, CKMB, CKMBINDEX, TROPONINI,  in the last 72 hours Thyroid Function Tests No results found for this basename: TSH, T4TOTAL, FREET3, T3FREE, THYROIDAB,  in the last 72 hours  TELE  Sinus tachycardia 100-110   Radiology/Studies  Dg Chest 2 View  09/06/2013   CLINICAL DATA:  CHF, intermittent dyspnea  EXAM: CHEST  2 VIEW  COMPARISON:  Prior chest x-ray 08/13/2013; prior chest CT 08/22/2013  FINDINGS: Stable cardiac and mediastinal contours.  Atherosclerotic calcifications are present within the transverse aorta. Moderate left and small right pleural effusions are similar compared to prior. There is persistent associated bibasilar atelectasis. Mild pulmonary vascular congestion without overt edema. No pneumothorax. No new focal airspace consolidation. No acute osseous abnormality.  IMPRESSION: 1. Stable moderate left and small right pleural effusions and associated bibasilar atelectasis. 2. Pulmonary vascular congestion without overt edema.   Electronically Signed   By: Jacqulynn Cadet M.D.   On: 09/06/2013 07:55   Ct Chest Wo Contrast  08/22/2013   CLINICAL DATA:  Left lung cancer status post lobectomy. Weight gain. Renal insufficiency.  EXAM: CT CHEST WITHOUT CONTRAST  TECHNIQUE: Multidetector CT imaging of the chest was performed following the standard protocol without IV contrast.  COMPARISON:  Radiographs 08/13/2013 and 08/04/2013.  CT 08/02/2012.  FINDINGS: There are stable postsurgical changes related to prior left lower lobe resection. There has been interval enlargement of several mediastinal lymph nodes. These include 12 mm right paratracheal (image 19), 13 mm precarinal (image 23), and 14 mm AP window (image 25) lymph nodes. Some of these nodes have retained fatty hila. Allowing for the limitations of noncontrast technique, the hila appear stable.  There is stable low-density within the right thyroid lobe. Atherosclerosis of the aorta, great vessels and coronary arteries is noted. The heart size is normal. There is no pericardial effusion.  Moderate size dependent pleural effusions are present bilaterally. There is associated dependent airspace disease in both lower lobes. In addition, there are more focal airspace opacities within the right lower lobe and inferior aspect of the left upper lobe. The latter is somewhat nodular, measuring up to 1.8 cm on image 43. No other focal nodularity or endobronchial lesions are demonstrated.  The  visualized upper abdomen has a stable appearance. There is no adrenal mass. There is increased subcutaneous edema throughout the subcutaneous fat, especially within the anterior aspect of the upper abdomen.  No worrisome osseous findings are demonstrated.  IMPRESSION: 1. As demonstrated radiographically, there are bilateral pleural effusions and bilateral airspace opacities which are new compared with the prior CT. Although there are focal somewhat nodular components in the right lower and left upper lobes, these findings are most likely infectious/inflammatory. 2. Progressive mediastinal lymphadenopathy. Some of the nodes have retained fatty hila and may be reactive. Metastatic disease cannot be completely excluded. 3. Stable atherosclerosis and low-density within the right thyroid lobe. 4. If the airspace opacities and pleural effusions fail to respond to appropriate clinical therapy, follow-up CT or PET-CT may be warranted to exclude  metastatic disease.   Electronically Signed   By: Camie Patience M.D.   On: 08/22/2013 10:26   Dg Chest Portable 1 View  08/13/2013   CLINICAL DATA:  Shortness of breath increasing for 2 weeks. History of CHF.  EXAM: PORTABLE CHEST - 1 VIEW  COMPARISON:  08/04/2013  FINDINGS: Again noted are bibasilar lung densities that are suggestive for pleural effusions and atelectasis. Heart size appears to be enlarged. There is some peribronchial thickening and cannot exclude pulmonary edema.  IMPRESSION: Bilateral pleural effusions with basilar atelectasis. There is mild pulmonary edema. Minimal change from the previous examination.   Electronically Signed   By: Markus Daft M.D.   On: 08/13/2013 23:10    ASSESSMENT AND PLAN 1. Cardiogenic shock 2. Acute on chronic systolic HF     EF ~32-95% 3. 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 Acute respiratory failure 5. 3-V CAD, severe.  6. Carotid stenosis     - doppler 08/31/2013 shows R 60-79% ICA stenosis, L 80-99% ICA stenosis     - vascular surgery  aware 7. 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 DM1 9. Ischemic R foot s/p R femoral thrombectomy and 4-compartment fasciotomy.  10. VT/VF arrest 6/3 11. R axillary DVT 12. Severe protein calorie malnutrition  13. Large right groin wound s/p debridement 6/12 14. Anemia: Transfuse hemoglobin < 7.  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 Will keep dobutamine at current dose. I discussed the possibility of home HD with Dr. Moshe Cipro today as outpatient HD center won't take patients on dobutamine. She is willing to entertain this if family is capable of helping (which I think they are). Will continue to discuss.   Myoclonus better on Keppra. Can go to 1000 bid, as needed.   Wound Care as per [facility]. INR appears therapeutic. Will stop argatroban and recheck INR,   Continue medical management of CAD. Long-term management of HF is problematic as she cannot tolerate any HF meds.   If she can tolerate intermittent HD; may be candidate for [facility] soon.   Melchor Kirchgessner,MD 9:29 AM

## 2013-10-12 LAB — CARBOXYHEMOGLOBIN
Carboxyhemoglobin: 2.2 % — ABNORMAL HIGH (ref 0.5–1.5)
METHEMOGLOBIN: 0.6 % (ref 0.0–1.5)
O2 Saturation: 70 %
TOTAL HEMOGLOBIN: 6.5 g/dL — AB (ref 12.0–16.0)

## 2013-10-12 LAB — RENAL FUNCTION PANEL
Albumin: 1.9 g/dL — ABNORMAL LOW (ref 3.5–5.2)
BUN: 21 mg/dL (ref 6–23)
CALCIUM: 8.1 mg/dL — AB (ref 8.4–10.5)
CHLORIDE: 98 meq/L (ref 96–112)
CO2: 26 mEq/L (ref 19–32)
Creatinine, Ser: 1.93 mg/dL — ABNORMAL HIGH (ref 0.50–1.10)
GFR calc Af Amer: 35 mL/min — ABNORMAL LOW (ref >90)
GFR, EST NON AFRICAN AMERICAN: 30 mL/min — AB (ref >90)
GLUCOSE: 112 mg/dL — AB (ref 70–99)
Phosphorus: 1.8 mg/dL — ABNORMAL LOW (ref 2.3–4.6)
Potassium: 4.6 mEq/L (ref 3.7–5.3)
Sodium: 135 mEq/L — ABNORMAL LOW (ref 137–147)

## 2013-10-12 LAB — CBC
HCT: 24.7 % — ABNORMAL LOW (ref 36.0–46.0)
Hemoglobin: 7.5 g/dL — ABNORMAL LOW (ref 12.0–15.0)
MCH: 29 pg (ref 26.0–34.0)
MCHC: 30.4 g/dL (ref 30.0–36.0)
MCV: 95.4 fL (ref 78.0–100.0)
Platelets: 253 10*3/uL (ref 150–400)
RBC: 2.59 MIL/uL — AB (ref 3.87–5.11)
RDW: 17.6 % — AB (ref 11.5–15.5)
WBC: 5.4 10*3/uL (ref 4.0–10.5)

## 2013-10-12 LAB — PROTIME-INR
INR: 3.51 — ABNORMAL HIGH (ref 0.00–1.49)
PROTHROMBIN TIME: 35.2 s — AB (ref 11.6–15.2)

## 2013-10-12 LAB — GLUCOSE, CAPILLARY
GLUCOSE-CAPILLARY: 106 mg/dL — AB (ref 70–99)
GLUCOSE-CAPILLARY: 169 mg/dL — AB (ref 70–99)
GLUCOSE-CAPILLARY: 176 mg/dL — AB (ref 70–99)

## 2013-10-12 LAB — MAGNESIUM: Magnesium: 2.3 mg/dL (ref 1.5–2.5)

## 2013-10-12 MED ORDER — ANTICOAGULANT SODIUM CITRATE 4% (200MG/5ML) IV SOLN
5.0000 mL | Status: DC | PRN
  Administered 2013-10-12: 5 mL via INTRAVENOUS
  Filled 2013-10-12 (×3): qty 250

## 2013-10-12 MED ORDER — DEXTROSE 5 % IV SOLN
20.0000 mmol | Freq: Once | INTRAVENOUS | Status: AC
  Administered 2013-10-12: 20 mmol via INTRAVENOUS
  Filled 2013-10-12: qty 6.67

## 2013-10-12 MED ORDER — LEVETIRACETAM 500 MG PO TABS
1000.0000 mg | ORAL_TABLET | Freq: Two times a day (BID) | ORAL | Status: DC
  Administered 2013-10-12 – 2013-10-27 (×30): 1000 mg via ORAL
  Filled 2013-10-12 (×32): qty 2

## 2013-10-12 NOTE — Progress Notes (Signed)
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 Developed cardiogenic shock, cold foot after IABP requiring femoral thrombectomy and fasciotomy (6/1), VT/VF then PEA arrest 6/3. She required initiation of RRT 09/06/13 and has remained dialysis dependent.  Short trial of IHD unsuccessful and back to CRRT as of 6/4 with inotrope requirement and inability to wean.  Assessment/Recommendations  1. Dialysis dependent anuric AoCKD5, now ESRD, have cont CRRT (Dialysis dependent since 5/21; current access is left TDC placed 5/27;  5/21 CRRT->IHD 5/26 ->back to CRRT 6/4 ; weight down 131->nadir of 113.6 with CRRT->allowed to drift back up some -     trying for cvp closer to 10.   We were trying to maintain BP off inotropes in order to transition to intermittent HD. However, I think we need to try and transition as she is not going to "give up" and may continue to be dobutamine dependent.  Plan to stop CRRT today and do IHD tomorrow. With current open groin wound, placement of PD catheter would carry great risk of infection due to proximity of the groin wound so possibility home HD with Nxstage ??  First we need to make sure she will tolerate IHD, then the task of training without having to do any OP HD as likely wlll be dobutamine dependent.? 2. Hypophosphatemia - intermittent replacement- will give today 3. Cardiogenic shock, Ischemic CM  and not a CABG candidate; EF 22% by myoview; 30-35 by echo as per #1; on dobutamine,midodrine and florinef-  NE off .   Trying to wean dobutamine and so far successful but took a step back today 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 S/p R femoral endarterectomy wound complications- wound VAC in place; maybe a little more dehiscence today. On vanco and meropenam with VAC changes per [facility]VS.  6. CAD, ischemic CM 7. Anemia - on darbe 200/week;  iron studies appear "adequate" ; s/p 2 units  for Hb of 6.9 (6/19)- back on anticoagulation- may need another transfusion 8. Twitching/myoclonus - ATB changed , now starting keppra per neurology- is worse- I cannot explain??? All elytes are OK 9. Lung CA with prior LL Lobectomy 10. Right axillary DVT 11. Bilateral carotid stenosis 12. Diabetes - insulin 13. Deconditioning- PT   Subjective: Says she feels bloated- weight up and CVP up with UF of 1200 on machine ?  But weight and CVP may not be right ?Dobutamine went up as well   Objective: Vital signs in last 24 hours: Temp:  [97.4 F (36.3 C)-98 F (36.7 C)] 97.5 F (36.4 C) (06/26 0738) Pulse Rate:  [41-124] 108 (06/26 0700) Resp:  [10-29] 10 (06/26 0700) BP: (71-124)/(23-86) 107/64 mmHg (06/26 0700) SpO2:  [90 %-100 %] 100 % (06/26 0700) Weight:  [125.2 kg (276 lb 0.3 oz)] 125.2 kg (276 lb 0.3 oz) (06/26 0500) Weight change: 3.9 kg (8 lb 9.6 oz)  Current CRRT Prescription:  Start Date: restarted 09/20/13  Catheter: TDC left BFR: 200  Pre Blood Pump: 300 DFR: 1500  Replacement Rate: 200  Goal UF      25-50 off  Per hour    Anticoagulation: argatroban systemic, clotted on heparin, +HIT  Clotting: not excessive  WEIGHT TRENDING 10/09/13 116.5 10/08/13 119.5 10/07/13 0630 119.8 kg  10/06/13 0700 117.9 kg 10/05/13 0500 117.2 kg (repeated - same) 10/04/13 0500 113.9 kg CRRT changed to "keep volume  even" fluid removal goal 10/03/13 0500 113.6 kg  10/02/13 0500 114.5 kg  10/01/13 0648 114.6 kg  09/30/13 0500 117.6 kg  09/29/13 0600 121.7 kg  09/28/13 0600 123.8 kg 09/27/13 0500 127.2 kg  09/26/13 0500 130.9 kg  09/25/13 0500 133.5 kg 09/24/13 0600 134.5 kg  09/23/13 0700 132.8 kg  09/22/13 0500 131.1 kg  09/21/13 0500 129.6 kg  09/20/13 0500 131 kg   CRRT restarted -50-100/hour  PHYSICAL EXAMINATION General appearance: alert and cooperative, sitting up in bed, NAD BP 107/64  Pulse 108  Temp(Src) 97.5 F (36.4 C) (Oral)  Resp 10  Ht 5' 7.5" (1.715 m)  Wt  125.2 kg (276 lb 0.3 oz)  BMI 42.57 kg/m2  SpO2 100%  LMP 08/19/2013 CVP 10-13 Alert,oriented, pleasant  Central line R and TDC L Lungs clear to auscultation bilaterally anteriorly   [facility] dressing dry and intact left chest Cardio: regular rate and rhythm S1, S2 normal, no murmur, click, rub or gallop GI: soft, non-tender; bowel sounds normal Extremities: 1+ edema to thighs Wound VAC in place R Groin; stapled incisions clean and dry on LE's Dusky tips of toes right foot #3-5 unchanged  Intake/Output from previous day: 06/14 0701 - 06/15 0700 In: 998.1 [I.V.:623.1; IV Piggyback:375] Out: 5433 [Drains:100]  Recent Labs  10/11/13 0400 10/11/13 1600 10/12/13 0400  NA 136* 135* 135*  K 5.0 5.2 4.6  CL 101 98 98  CO2 26 26 26   GLUCOSE 148* 178* 112*  BUN 21 23 21   CREATININE 2.26* 2.29* 1.93*  CALCIUM 8.1* 7.8* 8.1*  MG 2.5  --  2.3  PHOS 2.6 2.4 1.8*    Recent Labs  10/11/13 1600 10/12/13 0400  ALBUMIN 2.0* 1.9*    Recent Labs  10/11/13 0400 10/12/13 0400  WBC 5.3 5.4  HGB 7.9* 7.5*  HCT 25.9* 24.7*  MCV 95.6 95.4  PLT 241 253   Results for Hannah, [name] (MRN [id number]) as of 10/02/2013 09:13  Ref. Range 10/01/2013 08:30  Iron Latest Range: 42-135 ug/dL 45  UIBC Latest Range: 125-400 ug/dL 164  TIBC Latest Range: 250-470 ug/dL 209 (L)  Saturation Ratios Latest Range: 20-55 % 22  Ferritin Latest Range: 10-291 ng/mL 399 (H)    Scheduled Medications: . aspirin  81 mg Oral Daily  . atorvastatin  80 mg Oral q1800  . calcitRIOL  0.25 mcg Oral Daily  . darbepoetin (ARANESP) injection - NON-DIALYSIS  200 mcg Subcutaneous Q Wed-1800  . feeding supplement (NEPRO CARB STEADY)  237 mL Oral TID WC  . fludrocortisone  0.1 mg Oral Daily  . insulin aspart  0-15 Units Subcutaneous TID WC  . insulin aspart  0-5 Units Subcutaneous QHS  . insulin aspart  6 Units Subcutaneous TID WC  . insulin glargine  40 Units Subcutaneous Q24H  . levETIRAcetam  500 mg Oral BID  .  lidocaine (PF)  5 mL Other Once  . midodrine  10 mg Oral TID WC  . pantoprazole  40 mg Oral Daily  . polyethylene glycol  17 g Oral Daily  . Warfarin - Pharmacist Dosing Inpatient   Does not apply q1800  Infusion Medicications . sodium chloride Stopped (10/01/13 2000)  . DOBUTamine 4 mcg/kg/min (10/12/13 0325)  . dialysis replacement fluid (prismasate) 300 mL/hr at 10/12/13 0200  . dialysis replacement fluid (prismasate) 200 mL/hr at 10/11/13 0938  . dialysate (PRISMASATE) 1,500 mL/hr at 10/12/13 0649    LOS: 39 days   GOLDSBOROUGH,KELLIE A 10/12/2013,8:26 AM

## 2013-10-12 NOTE — Progress Notes (Signed)
Received call from respiratory in reference to panic Hgb of 6.5 on Co Ox.  CBC showed Hbg to be 7.5.  Will inform MD on morning rounds.  Will continue to monitor pt.

## 2013-10-12 NOTE — Progress Notes (Signed)
Pt requested to move back to bed from the chair with using BSC in process.  While returning pt to bed, pt became nauseated and felt weak.  Situated pt in bed.  Pt stated still feeling nauseated and bloated.  4mg  IV zofran given.  Cool cloth placed on pt forehead.  Pt then began to vomit.  Pt states after vomiting she left better but pt was noted to be diaphoretic.  Pt thermostat adjusted to make room cooler.  Cool rag remains on pt head.  Will continue to monitor closely.

## 2013-10-12 NOTE — Progress Notes (Signed)
  [facility] Progress Note  10/12/2013 10:09 AM 14 Days Post-Op  Subjective:  No complaints this am.   Tmax 98 BP sys 70s-120s 02 100% RA  Filed Vitals:   10/12/13 0930  BP: 96/53  Pulse: 116  Temp:   Resp: 17    Physical Exam: Incisions: Right groin with minimal granulation tissue. Wound vac dressing changed today. Seal intact. Right medial fasciotomy incision with further dehiscence, but clean with good granulation tissue. Lateral fasciotomy incision unchanged with dehiscence and good granulation tissue.  Extremities: DP doppler signals bilaterally.   CBC    Component Value Date/Time   WBC 5.4 10/12/2013 0400   RBC 2.59* 10/12/2013 0400   HGB 7.5* 10/12/2013 0400   HCT 24.7* 10/12/2013 0400   PLT 253 10/12/2013 0400   MCV 95.4 10/12/2013 0400   MCH 29.0 10/12/2013 0400   MCHC 30.4 10/12/2013 0400   RDW 17.6* 10/12/2013 0400   LYMPHSABS 1.4 08/04/2013 1223   MONOABS 0.4 08/04/2013 1223   EOSABS 0.2 08/04/2013 1223   BASOSABS 0.1 08/04/2013 1223    BMET    Component Value Date/Time   NA 135* 10/12/2013 0400   K 4.6 10/12/2013 0400   CL 98 10/12/2013 0400   CO2 26 10/12/2013 0400   GLUCOSE 112* 10/12/2013 0400   BUN 21 10/12/2013 0400   CREATININE 1.93* 10/12/2013 0400   CREATININE 4.17* 07/06/2013 1618   CALCIUM 8.1* 10/12/2013 0400   GFRNONAA 30* 10/12/2013 0400   GFRNONAA 71 07/13/2011 0835   GFRAA 35* 10/12/2013 0400   GFRAA 82 07/13/2011 0835    INR    Component Value Date/Time   INR 3.51* 10/12/2013 0400     Intake/Output Summary (Last 24 hours) at 10/12/13 1009 Last data filed at 10/12/13 0900  Gross per 24 hour  Intake    591 ml  Output   1613 ml  Net  -1022 ml     Assessment:  43 y.o. female is s/p:  Right femoral endarterectomy, right femoral thrombectomy, four compartment fasciotomy 25 Days Post-Op  Closure of right leg fasciotomy 23 Days Post-Op  I&D right groin with placement of wound vac.  14 Days Post-Op  Plan: -Right groin  healing very slowly. Continue wound vac. Continue Vancomycin and Meropenem.  -Progressive dehiscence of right medial fasciotomy site. Continue wet-to-dry dressings.  -Patient still requiring dobutamine. Per nephrology, will try to transition from CRRT to HD tomorrow.   -DVT prophylaxis:  Argatroban discontinued today per Dr. Haroldine Laws. INR is 3. Coumadin decreased to 2.5mg  daily    Virgina Jock, PA-C [facility] Office: [phone] Pager: [phone] 10/12/2013 10:09 AM

## 2013-10-12 NOTE — Progress Notes (Signed)
Pt continues to "feel bad".  Pt vomited again and had diarrhea.  Pt still feeling bloated and belching.  Paged [facility] and received call back from Dr. Augustin Coupe.  Updated him on pt status with regard to vomiting, diarrhea and bloating.  He stated to restart CRRT as per previous orders.  I advised that she has orders to attempt HD tomorrow and he advised he was aware.  Will restart CRRT and continue to monitor pt.

## 2013-10-12 NOTE — Progress Notes (Addendum)
INITIAL NUTRITION ASSESSMENT  DOCUMENTATION CODES Per approved criteria  -Morbid Obesity   INTERVENTION:  Continue Nepro Shake po TID, each supplement provides 425 kcal and 19 grams protein RD to follow for nutrition care plan  NUTRITION DIAGNOSIS: Increased nutrient needs related to wound healing, ESRD as evidenced by estimated nutrition needs  Goal: Pt to meet >/= 90% of their estimated nutrition needs   Monitor:  PO & supplemental intake, weight, labs, I/O's  Reason for Assessment: LOS, wound   43 y.o. female  Admitting Dx: Intermediate coronary syndrome  ASSESSMENT: 43 yo morbidly obese female with PMH of DM1, stage V CKD, blindness, h/o lung CA s/p resection; had a high risk stress test which showed ischemia in anterior and apical region. She underwent cath 09/04/2013 which showed chronically occluded RCA and triple vessel dx.   Underwent placement of swan and IABP last week for shock (co-ox 38%). Unfortunately developed cold foot and IABP had to be removed.Underwent R femoral thrombectomy and 4 compartment fasciotomy of RLE on 6/1.On 6/3 had VT/VF->PEA arrest 10 mins CPR. U/s shows R axillary DVT.  Patient s/p procedure 6/12: I&D RIGHT GROIN, INCLUDING SKIN, SUBCUTANEOUS TISSUE PLACEMENT OF WOUND [facility]   Nephrology following for CRRT.  Will try and transition to HD tomorrow.   Patient reports a good appetite.  PO intake 100% per flowsheet records.  Drinking her Nepro supplements.  Height: Ht Readings from Last 1 Encounters:  09/03/13 5' 7.5" (1.715 m)    Weight: Wt Readings from Last 1 Encounters:  10/12/13 276 lb 0.3 oz (125.2 kg)    Ideal Body Weight: 135 lb  % Ideal Body Weight: 204%  Wt Readings from Last 10 Encounters:  10/12/13 276 lb 0.3 oz (125.2 kg)  10/12/13 276 lb 0.3 oz (125.2 kg)  10/12/13 276 lb 0.3 oz (125.2 kg)  10/12/13 276 lb 0.3 oz (125.2 kg)  10/12/13 276 lb 0.3 oz (125.2 kg)  10/12/13 276 lb 0.3 oz (125.2 kg)  10/12/13 276 lb 0.3 oz  (125.2 kg)  09/03/13 280 lb (127.007 kg)  08/27/13 281 lb (127.461 kg)  08/22/13 280 lb (127.007 kg)    Usual Body Weight: 280 lb  % Usual Body Weight: 98%  BMI:  Body mass index is 42.57 kg/(m^2).  Estimated Nutritional Needs: Kcal: 2000-2200 Protein: 115-125 gm Fluid: 2.0-2.2 L  Skin: surgical wound to right groin -- VAC  Diet Order: Diabetic  EDUCATION NEEDS: -No education needs identified at this time   Intake/Output Summary (Last 24 hours) at 10/12/13 1439 Last data filed at 10/12/13 1300  Gross per 24 hour  Intake   1445 ml  Output   2722 ml  Net  -1277 ml    Labs:   Recent Labs Lab 10/10/13 0322  10/11/13 0400 10/11/13 1600 10/12/13 0400  NA 137  < > 136* 135* 135*  K 4.8  < > 5.0 5.2 4.6  CL 100  < > 101 98 98  CO2 25  < > 26 26 26   BUN 21  < > 21 23 21   CREATININE 2.12*  < > 2.26* 2.29* 1.93*  CALCIUM 8.4  < > 8.1* 7.8* 8.1*  MG 2.4  --  2.5  --  2.3  PHOS 2.5  < > 2.6 2.4 1.8*  GLUCOSE 96  < > 148* 178* 112*  < > = values in this interval not displayed.  CBG (last 3)   Recent Labs  10/11/13 2144 10/12/13 0735 10/12/13 1146  GLUCAP 116* 106*  169*    Scheduled Meds: . aspirin  81 mg Oral Daily  . atorvastatin  80 mg Oral q1800  . calcitRIOL  0.25 mcg Oral Daily  . darbepoetin (ARANESP) injection - NON-DIALYSIS  200 mcg Subcutaneous Q Wed-1800  . feeding supplement (NEPRO CARB STEADY)  237 mL Oral TID WC  . fludrocortisone  0.1 mg Oral Daily  . insulin aspart  0-15 Units Subcutaneous TID WC  . insulin aspart  0-5 Units Subcutaneous QHS  . insulin aspart  6 Units Subcutaneous TID WC  . insulin glargine  40 Units Subcutaneous Q24H  . levETIRAcetam  1,000 mg Oral BID  . lidocaine (PF)  5 mL Other Once  . midodrine  10 mg Oral TID WC  . pantoprazole  40 mg Oral Daily  . polyethylene glycol  17 g Oral Daily  . sodium phosphate  Dextrose 5% IVPB  20 mmol Intravenous Once  . Warfarin - Pharmacist Dosing Inpatient   Does not apply q1800     Continuous Infusions: . sodium chloride Stopped (10/01/13 2000)  . DOBUTamine 4 mcg/kg/min (10/12/13 0325)  . dialysis replacement fluid (prismasate) 300 mL/hr at 10/12/13 0200  . dialysis replacement fluid (prismasate) 200 mL/hr at 10/12/13 1130  . dialysate (PRISMASATE) 1,500 mL/hr at 10/12/13 1347    Past Medical History  Diagnosis Date  . High cholesterol   . Diabetic retinopathy   . Peripheral neuropathy     "tips of toes"  . Blind right eye   . CHF (congestive heart failure)   . CAD (coronary artery disease)   . GERD (gastroesophageal reflux disease)   . Hypertension   . History of lung cancer 07/2011    s/p left lower lobectomy  . Diabetes mellitus     IDDM  . Cataract of right eye   . Ulcer of toe of right foot 07/10/2012    great toe  . Breast calcification, left 06/2012  . Acute biphenotypic leukemia   . CKD (chronic kidney disease) stage 5, GFR less than 15 ml/min   . Nephrotic syndrome   . Gastritis     H/o gastritis on prior endoscopy  . Anemia   . Carotid artery disease     Past Surgical History  Procedure Laterality Date  . Cardiac catheterization  07/16/2011  . Incision and drainage breast abscess Left   . Tubal ligation  1994  . Vitrectomy  2010    2 on left, 1 on right  . Cesarean section  1991; 1994  . Video assisted thoracoscopy (vats)/ lobectomy Left 07/30/2011    left main thoracotomy, left lower lobectomy, mediastinal lymph node dissection  . Lobectomy    . Colonoscopy with esophagogastroduodenoscopy (egd) N/A 08/14/2012    Procedure: COLONOSCOPY WITH ESOPHAGOGASTRODUODENOSCOPY (EGD);  Surgeon: Danie Binder, MD;  [facility];  Service: Endoscopy;  Laterality: N/A;  10:45-moved to 1110 [address] to notify pt  . Breast lumpectomy with needle localization Left 11/14/2012    Procedure: BREAST LUMPECTOMY WITH NEEDLE LOCALIZATION;  Surgeon: Marcello Moores A. Cornett, MD;  Location: [place];  Service: General;  Laterality: Left;  . Insertion of  dialysis catheter Left 09/12/2013    Procedure: INSERTION OF DIALYSIS CATHETER;  Surgeon: Mal Misty, MD;  Location: [place];  Service: Vascular;  Laterality: Left;  . Embolectomy Right 09/17/2013    Procedure: Thrombectomy of Right Common Femoral Artery;  Surgeon: Elam Dutch, MD;  Location: [place] OR;  Service: Vascular;  Laterality: Right;  . Endarterectomy  femoral Right 09/17/2013    Procedure: Right Femoral Endarterectomy;  Surgeon: Elam Dutch, MD;  Location: [facility] OR;  Service: Vascular;  Laterality: Right;  . Patch angioplasty Right 09/17/2013    Procedure: Vein Patch Angioplasty of Right Femoral Artery;  Surgeon: Elam Dutch, MD;  Location: [place];  Service: Vascular;  Laterality: Right;  . Fasciotomy Right 09/17/2013    Procedure: Four Compartment Fasciotomy;  Surgeon: Elam Dutch, MD;  Location: [place];  Service: Vascular;  Laterality: Right;  . Fasciotomy closure Right 09/19/2013    Procedure: FASCIOTOMY CLOSURE;  Surgeon: Elam Dutch, MD;  Location: [place];  Service: Vascular;  Laterality: Right;  regional block and monitored anesthesia care used  . I&d extremity Right 09/28/2013    Procedure: IRRIGATION AND DEBRIDEMENT EXTREMITY;  Surgeon: Serafina Mitchell, MD;  Location: [facility];  Service: Vascular;  Laterality: Right;  . Application of wound vac Right 09/28/2013    Procedure: APPLICATION OF WOUND VAC;  Surgeon: Serafina Mitchell, MD;  Location: [place];  Service: Vascular;  Laterality: Right;    Arthur Holms, RD, LDN Pager #: [phone] After-Hours Pager #: [phone]

## 2013-10-12 NOTE — Progress Notes (Signed)
Inpatient Diabetes Program Recommendations  AACE/ADA: New Consensus Statement on Inpatient Glycemic Control (2013)  Target Ranges:  Prepandial:   less than 140 mg/dL      Peak postprandial:   less than 180 mg/dL (1-2 hours)      Critically ill patients:  140 - 180 mg/dL   Reason for Assessment:Results for ADASYN, MCADAMS (MRN [id number]) as of 10/12/2013 10:32  Ref. Range 10/11/2013 12:02 10/11/2013 14:18 10/11/2013 17:15 10/11/2013 21:44 10/12/2013 07:35  Glucose-Capillary Latest Range: 70-99 mg/dL 53 (L) 116 (H) 166 (H) 116 (H) 106 (H)     Consider reducing Lantus to 35 units daily and decrease Novolog meal coverage to 4 units tid with meals.    Thanks, Adah Perl, RN, BC-ADM Inpatient Diabetes Coordinator Pager [phone]

## 2013-10-12 NOTE — Progress Notes (Signed)
Patient ID: Hannah [name], female   DOB: 1970/08/08, 43 y.o.   MRN: [id number]  ADVANCED HEART FAILURE ROUNDING NOTE   SUBJECTIVE  43 yo female with PMH of DM1, stage V kidney disease, blindness, h/o lung CA s/p resection who was recently discharged for heart failure symptom had a high risk stress test which showed ischemia in anterior and apical region. She underwent cath 09/04/2013 which showed chronically occluded RCA and triple vessel dx. CT surgery consulted but not felt to be surgical candidate due to poor PFTs, suboptimal targets, comorbidities and shock. EF 22% by Myoview. 30-35% by echo  Underwent placement of swan and IABP for shock (co-ox 38%). Unfortunately developed cold foot and IABP had to be removed.Underwent R femoral thrombectomy and 4 compartment fasciotomy of RLE on 6/1.On 6/3 had VT/VF->PEA arrest 10 mins CPR. U/s shows R axillary DVT  Myoclonus improving with Keppra. Remains on dobutamine 4. Co-ox back up to 70.  Weight stable but up from baseline. Feels bloated. Denies dyspnea or orthopnea.    CURRENT MEDS . aspirin  81 mg Oral Daily  . atorvastatin  80 mg Oral q1800  . calcitRIOL  0.25 mcg Oral Daily  . darbepoetin (ARANESP) injection - NON-DIALYSIS  200 mcg Subcutaneous Q Wed-1800  . feeding supplement (NEPRO CARB STEADY)  237 mL Oral TID WC  . fludrocortisone  0.1 mg Oral Daily  . insulin aspart  0-15 Units Subcutaneous TID WC  . insulin aspart  0-5 Units Subcutaneous QHS  . insulin aspart  6 Units Subcutaneous TID WC  . insulin glargine  40 Units Subcutaneous Q24H  . levETIRAcetam  500 mg Oral BID  . lidocaine (PF)  5 mL Other Once  . midodrine  10 mg Oral TID WC  . pantoprazole  40 mg Oral Daily  . polyethylene glycol  17 g Oral Daily  . sodium phosphate  Dextrose 5% IVPB  20 mmol Intravenous Once  . Warfarin - Pharmacist Dosing Inpatient   Does not apply q1800    OBJECTIVE  Filed Vitals:   10/12/13 0815 10/12/13 0830 10/12/13 0845 10/12/13 0900  BP:   102/65  96/82  Pulse: 109 108 100 110  Temp:      TempSrc:      Resp: 11 18 21 12   Height:      Weight:      SpO2: 98% 98% 97% 100%    Intake/Output Summary (Last 24 hours) at 10/12/13 0910 Last data filed at 10/12/13 0800  Gross per 24 hour  Intake    698 ml  Output   1555 ml  Net   -857 ml   Filed Weights   10/11/13 0600 10/11/13 2100 10/12/13 0500  Weight: 121.3 kg (267 lb 6.7 oz) 125.2 kg (276 lb 0.3 oz) 125.2 kg (276 lb 0.3 oz)    PHYSICAL EXAM General: Lying in bed.  NAD Neuro: Alert and oriented X 3. Moves all extremities spontaneous HEENT:  Normal  Neck: Supple without bruits. Chest L subclav PC. R subclav TLC. JVP up Lungs:  clear Heart: tachy regular. +s3 Abdomen: Soft, non-tender, non-distended, BS + x 4.  Extremities: 2+ edema bilateral ankles. Dressing on RLE. Wound vac in R groin   CBC  Recent Labs  10/11/13 0400 10/12/13 0400  WBC 5.3 5.4  HGB 7.9* 7.5*  HCT 25.9* 24.7*  MCV 95.6 95.4  PLT 241 326   Basic Metabolic Panel  Recent Labs  10/11/13 0400 10/11/13 1600 10/12/13 0400  NA 136* 135*  135*  K 5.0 5.2 4.6  CL 101 98 98  CO2 26 26 26   GLUCOSE 148* 178* 112*  BUN 21 23 21   CREATININE 2.26* 2.29* 1.93*  CALCIUM 8.1* 7.8* 8.1*  MG 2.5  --  2.3  PHOS 2.6 2.4 1.8*   Liver Function Tests  Recent Labs  10/11/13 1600 10/12/13 0400  ALBUMIN 2.0* 1.9*   Cardiac Enzymes No results found for this basename: CKTOTAL, CKMB, CKMBINDEX, TROPONINI,  in the last 72 hours Thyroid Function Tests No results found for this basename: TSH, T4TOTAL, FREET3, T3FREE, THYROIDAB,  in the last 72 hours  TELE  Sinus tachycardia 100-110   Radiology/Studies  Dg Chest 2 View  09/06/2013   CLINICAL DATA:  CHF, intermittent dyspnea  EXAM: CHEST  2 VIEW  COMPARISON:  Prior chest x-ray 08/13/2013; prior chest CT 08/22/2013  FINDINGS: Stable cardiac and mediastinal contours. Atherosclerotic calcifications are present within the transverse aorta. Moderate  left and small right pleural effusions are similar compared to prior. There is persistent associated bibasilar atelectasis. Mild pulmonary vascular congestion without overt edema. No pneumothorax. No new focal airspace consolidation. No acute osseous abnormality.  IMPRESSION: 1. Stable moderate left and small right pleural effusions and associated bibasilar atelectasis. 2. Pulmonary vascular congestion without overt edema.   Electronically Signed   By: Jacqulynn Cadet M.D.   On: 09/06/2013 07:55   Ct Chest Wo Contrast  08/22/2013   CLINICAL DATA:  Left lung cancer status post lobectomy. Weight gain. Renal insufficiency.  EXAM: CT CHEST WITHOUT CONTRAST  TECHNIQUE: Multidetector CT imaging of the chest was performed following the standard protocol without IV contrast.  COMPARISON:  Radiographs 08/13/2013 and 08/04/2013.  CT 08/02/2012.  FINDINGS: There are stable postsurgical changes related to prior left lower lobe resection. There has been interval enlargement of several mediastinal lymph nodes. These include 12 mm right paratracheal (image 19), 13 mm precarinal (image 23), and 14 mm AP window (image 25) lymph nodes. Some of these nodes have retained fatty hila. Allowing for the limitations of noncontrast technique, the hila appear stable.  There is stable low-density within the right thyroid lobe. Atherosclerosis of the aorta, great vessels and coronary arteries is noted. The heart size is normal. There is no pericardial effusion.  Moderate size dependent pleural effusions are present bilaterally. There is associated dependent airspace disease in both lower lobes. In addition, there are more focal airspace opacities within the right lower lobe and inferior aspect of the left upper lobe. The latter is somewhat nodular, measuring up to 1.8 cm on image 43. No other focal nodularity or endobronchial lesions are demonstrated.  The visualized upper abdomen has a stable appearance. There is no adrenal mass. There is  increased subcutaneous edema throughout the subcutaneous fat, especially within the anterior aspect of the upper abdomen.  No worrisome osseous findings are demonstrated.  IMPRESSION: 1. As demonstrated radiographically, there are bilateral pleural effusions and bilateral airspace opacities which are new compared with the prior CT. Although there are focal somewhat nodular components in the right lower and left upper lobes, these findings are most likely infectious/inflammatory. 2. Progressive mediastinal lymphadenopathy. Some of the nodes have retained fatty hila and may be reactive. Metastatic disease cannot be completely excluded. 3. Stable atherosclerosis and low-density within the right thyroid lobe. 4. If the airspace opacities and pleural effusions fail to respond to appropriate clinical therapy, follow-up CT or PET-CT may be warranted to exclude metastatic disease.   Electronically Signed   By: Rush Landmark  Lin Landsman M.D.   On: 08/22/2013 10:26   Dg Chest Portable 1 View  08/13/2013   CLINICAL DATA:  Shortness of breath increasing for 2 weeks. History of CHF.  EXAM: PORTABLE CHEST - 1 VIEW  COMPARISON:  08/04/2013  FINDINGS: Again noted are bibasilar lung densities that are suggestive for pleural effusions and atelectasis. Heart size appears to be enlarged. There is some peribronchial thickening and cannot exclude pulmonary edema.  IMPRESSION: Bilateral pleural effusions with basilar atelectasis. There is mild pulmonary edema. Minimal change from the previous examination.   Electronically Signed   By: Markus Daft M.D.   On: 08/13/2013 23:10    ASSESSMENT AND PLAN 1. Cardiogenic shock 2. Acute on chronic systolic HF     EF ~03-83% 3. 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 Acute respiratory failure 5. 3-V CAD, severe.  6. Carotid stenosis     - doppler 08/31/2013 shows R 60-79% ICA stenosis, L 80-99% ICA stenosis     - vascular surgery aware 7. Lung CA  h/o LLL lobectomy (2 yr ago due to non-small cell carcinoma)      -  PFT 09/05/2013 pre-FEV1 28%, pre-FVC 34%, pre-FEV1/FVC ratio 68 8. DM1 9. Ischemic R foot s/p R femoral thrombectomy and 4-compartment fasciotomy.  10. VT/VF arrest 6/3 11. R axillary DVT 12. Severe protein calorie malnutrition  13. Large right groin wound s/p debridement 6/12 14. Anemia: Transfuse hemoglobin < 7.  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 Will keep dobutamine at current dose. Co-ox improved. Renal planning to switch to intermittent HD today/tommorrow. Will see how she tolerates. If tolerates will consider home HD. Family is amenable and appears capable to handle.   Myoclonus improved on Keppra but still present. Increase to 1000 bid.   Wound Care as per [facility]. INR  Therapeutic - actually a bit high. Hold coumadin today.   Continue medical management of CAD. Long-term management of HF is problematic as she cannot tolerate any HF meds.   If she can tolerate intermittent HD; may be candidate for [facility] soon.   Daniel Bensimhon,MD 9:10 AM

## 2013-10-12 NOTE — Progress Notes (Signed)
ANTICOAGULATION CONSULT NOTE - Follow Up Consult  Pharmacy Consult for Coumadin Indication: Thromboembolic state (RUE DVT)  Allergies  Allergen Reactions  . Crestor [Rosuvastatin] Other (See Comments)    Severe muscle weakness  . Nsaids Other (See Comments)    Not allergic, "bad on my kidneys"  . 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 Ciprofloxacin Rash    Patient Measurements: Height: 5' 7.5" (171.5 cm) Weight: 276 lb 0.3 oz (125.2 kg) IBW/kg (Calculated) : 62.75  Vital Signs: Temp: 97.5 F (36.4 C) (06/26 0738) Temp src: Oral (06/26 0738) BP: 96/53 mmHg (06/26 0930) Pulse Rate: 116 (06/26 0930)  Labs:  Recent Labs  10/10/13 0322  10/11/13 0400 10/11/13 1215 10/11/13 1600 10/12/13 0400  HGB 8.1*  --  7.9*  --   --  7.5*  HCT 27.0*  --  25.9*  --   --  24.7*  PLT 270  --  241  --   --  253  APTT 80*  --  75*  --   --   --   LABPROT 26.6*  --  40.1* 31.3*  --  35.2*  INR 2.55*  --  4.15* 3.02*  --  3.51*  CREATININE 2.12*  < > 2.26*  --  2.29* 1.93*  < > = values in this interval not displayed.  Estimated Creatinine Clearance: 51 ml/min (by C-G formula based on Cr of 1.93).  Assessment: 43yof continues on coumadin for RUE DVT. She has completed 5 days of overlap therapy with argatroban. Argatroban was stopped yesterday due to therapeutic INR. Today's INR 3.5 is above goal. Can consider this to be a true INR since argatroban has been off almost 24 hours. Hgb low but stable, platelets stable. No bleeding reported.  Goal of Therapy: INR 2-3 Monitor platelets by anticoagulation protocol: Yes   Plan:  1) No coumadin tonight 2) INR, CBC in AM  Deboraha Sprang 10/12/2013,11:02 AM

## 2013-10-13 DIAGNOSIS — R197 Diarrhea, unspecified: Secondary | ICD-10-CM | POA: Diagnosis present

## 2013-10-13 DIAGNOSIS — R112 Nausea with vomiting, unspecified: Secondary | ICD-10-CM | POA: Diagnosis present

## 2013-10-13 LAB — RENAL FUNCTION PANEL
Albumin: 2 g/dL — ABNORMAL LOW (ref 3.5–5.2)
Albumin: 2 g/dL — ABNORMAL LOW (ref 3.5–5.2)
BUN: 29 mg/dL — AB (ref 6–23)
BUN: 24 mg/dL — ABNORMAL HIGH (ref 6–23)
CALCIUM: 8.1 mg/dL — AB (ref 8.4–10.5)
CO2: 25 mEq/L (ref 19–32)
CO2: 26 mEq/L (ref 19–32)
CREATININE: 2.45 mg/dL — AB (ref 0.50–1.10)
CREATININE: 2.22 mg/dL — AB (ref 0.50–1.10)
Calcium: 7.8 mg/dL — ABNORMAL LOW (ref 8.4–10.5)
Chloride: 97 mEq/L (ref 96–112)
Chloride: 98 mEq/L (ref 96–112)
GFR calc Af Amer: 26 mL/min — ABNORMAL LOW (ref >90)
GFR calc Af Amer: 30 mL/min — ABNORMAL LOW (ref >90)
GFR calc non Af Amer: 23 mL/min — ABNORMAL LOW (ref >90)
GFR calc non Af Amer: 26 mL/min — ABNORMAL LOW (ref >90)
GLUCOSE: 65 mg/dL — AB (ref 70–99)
Glucose, Bld: 137 mg/dL — ABNORMAL HIGH (ref 70–99)
PHOSPHORUS: 3 mg/dL (ref 2.3–4.6)
Phosphorus: 2.1 mg/dL — ABNORMAL LOW (ref 2.3–4.6)
Potassium: 4.5 mEq/L (ref 3.7–5.3)
Potassium: 4.5 mEq/L (ref 3.7–5.3)
Sodium: 134 mEq/L — ABNORMAL LOW (ref 137–147)
Sodium: 136 mEq/L — ABNORMAL LOW (ref 137–147)

## 2013-10-13 LAB — CBC
HCT: 26.4 % — ABNORMAL LOW (ref 36.0–46.0)
Hemoglobin: 8 g/dL — ABNORMAL LOW (ref 12.0–15.0)
MCH: 28.9 pg (ref 26.0–34.0)
MCHC: 30.3 g/dL (ref 30.0–36.0)
MCV: 95.3 fL (ref 78.0–100.0)
PLATELETS: 271 10*3/uL (ref 150–400)
RBC: 2.77 MIL/uL — ABNORMAL LOW (ref 3.87–5.11)
RDW: 17.4 % — AB (ref 11.5–15.5)
WBC: 7.7 10*3/uL (ref 4.0–10.5)

## 2013-10-13 LAB — GLUCOSE, CAPILLARY
GLUCOSE-CAPILLARY: 116 mg/dL — AB (ref 70–99)
GLUCOSE-CAPILLARY: 64 mg/dL — AB (ref 70–99)
Glucose-Capillary: 115 mg/dL — ABNORMAL HIGH (ref 70–99)
Glucose-Capillary: 76 mg/dL (ref 70–99)
Glucose-Capillary: 59 mg/dL — ABNORMAL LOW (ref 70–99)
Glucose-Capillary: 73 mg/dL (ref 70–99)

## 2013-10-13 LAB — CARBOXYHEMOGLOBIN
Carboxyhemoglobin: 1.8 % — ABNORMAL HIGH (ref 0.5–1.5)
Carboxyhemoglobin: 1.8 % — ABNORMAL HIGH (ref 0.5–1.5)
Methemoglobin: 0.6 % (ref 0.0–1.5)
Methemoglobin: 0.7 % (ref 0.0–1.5)
O2 SAT: 53.4 %
O2 Saturation: 53 %
Total hemoglobin: 8.3 g/dL — ABNORMAL LOW (ref 12.0–16.0)
Total hemoglobin: 7.3 g/dL — ABNORMAL LOW (ref 12.0–16.0)

## 2013-10-13 LAB — MAGNESIUM: Magnesium: 2.3 mg/dL (ref 1.5–2.5)

## 2013-10-13 LAB — HEPATIC FUNCTION PANEL
ALT: 15 U/L (ref 0–35)
AST: 20 U/L (ref 0–37)
Albumin: 2 g/dL — ABNORMAL LOW (ref 3.5–5.2)
Alkaline Phosphatase: 138 U/L — ABNORMAL HIGH (ref 39–117)
TOTAL PROTEIN: 5.6 g/dL — AB (ref 6.0–8.3)
Total Bilirubin: 0.3 mg/dL (ref 0.3–1.2)

## 2013-10-13 LAB — CLOSTRIDIUM DIFFICILE BY PCR: CDIFFPCR: NEGATIVE

## 2013-10-13 LAB — PROTIME-INR
INR: 3.68 — ABNORMAL HIGH (ref 0.00–1.49)
Prothrombin Time: 36.5 seconds — ABNORMAL HIGH (ref 11.6–15.2)

## 2013-10-13 LAB — LACTIC ACID, PLASMA: Lactic Acid, Venous: 1 mmol/L (ref 0.5–2.2)

## 2013-10-13 MED ORDER — SODIUM CHLORIDE 0.9 % IJ SOLN: 10.0000 mL | INTRAMUSCULAR | Status: DC | PRN

## 2013-10-13 MED ORDER — PROMETHAZINE HCL 25 MG/ML IJ SOLN
12.5000 mg | Freq: Four times a day (QID) | INTRAMUSCULAR | Status: DC | PRN
  Administered 2013-10-13 – 2013-10-29 (×7): 12.5 mg via INTRAVENOUS
  Filled 2013-10-13 (×7): qty 1

## 2013-10-13 MED ORDER — ONDANSETRON HCL 4 MG/2ML IJ SOLN
4.0000 mg | Freq: Once | INTRAMUSCULAR | Status: AC
  Administered 2013-10-13: 4 mg via INTRAVENOUS

## 2013-10-13 MED ORDER — SODIUM CHLORIDE 0.9 % IJ SOLN
10.0000 mL | Freq: Two times a day (BID) | INTRAMUSCULAR | Status: DC
  Administered 2013-10-13: 10 mL
  Administered 2013-10-14: 20 mL
  Administered 2013-10-14: 30 mL
  Administered 2013-10-15 – 2013-10-25 (×16): 10 mL
  Administered 2013-10-25: 30 mL
  Administered 2013-10-26 – 2013-10-27 (×4): 10 mL
  Administered 2013-10-28: 30 mL
  Administered 2013-10-28: 10 mL

## 2013-10-13 NOTE — Progress Notes (Signed)
hHypoglycemic Event  CBG: 59  Treatment: 4oz apple juice given  Symptoms: None  Follow-up CBG: Time:1700 BG Result:64  Treatment: 4oz can Cola  Follow up CBG Time : 1800  Result:  73  Possible Reasons for Event: Other: poor PO intake due to upset stomach     Lenox Ahr  Remember to initiate Hypoglycemia Order Set & complete

## 2013-10-13 NOTE — Progress Notes (Signed)
Pt called RN to room.  Pt incontinent of stool.  After cleaning pt, pt complaining again of nausea and belching.  4mg  IV zofran given.  Will continue to monitor pt.

## 2013-10-13 NOTE — Progress Notes (Signed)
I agree with the above  Wells Brabham 

## 2013-10-13 NOTE — Consult Note (Addendum)
[facility] Medical Consultation  Hannah [name] LNL:[id number] DOB: 1971-04-10 DOA: 09/03/2013 PCP: Vic Blackbird, MD   Requesting physician: Radford Pax Date of consultation: 10/13/13 Reason for consultation: Nausea and vomiting  Impression/Recommendations Principal Problem:   Cardiogenic shock Active Problems:   Nephrotic syndrome   Type II or unspecified type diabetes mellitus with neurological manifestations, not stated as uncontrolled   Hyperlipidemia   Essential hypertension, benign   Normocytic anemia   Chronic kidney disease (CKD), stage V   CHF (congestive heart failure)   CAD (coronary artery disease)   Intermediate coronary syndrome   Acute on chronic systolic heart failure   Acute respiratory failure with hypoxia   DVT of axillary vein, acute right   Right groin wound   Ventricular tachycardia   Ischemia of foot   Myoclonus   Refractory nausea and vomiting   Diarrhea    1. Nausea, vomiting, diarrhea - DDX includes, c.diff, cholecystitis, gastroenteritis, not tolerating being off of CVVHD (per nephrology earlier this evening this apparently could cause this) among many other causes.  CBC and BMP this morning are back and appear grossly unchanged from previous. 1. stool sent for C.Diff 2. Will control N/V/D with zofran and phenergan if zofran fails 3. LFTs ordered, lactate ordered 4. Since symptoms improved / resolved at this time, will hold off on CT abd/pelvis but if symptoms return then will need CT abd/pelvis to look for abdominal pathology and narrow down the very broad differential diagnosis for N/V/D in this hospital patient. 5. Nephrology to adjust CVVHD to keep patient slightly positive (to account for fluid loss from N/V/D), RN will call them to ask how they want this adjusted. 2. Cardiogenic shock - inotrope dependent, see cardiology's note, cardiology is primary on patient 3. ESRD - see nephrology's note, CVVHD currently running 4. 3v CAD - needs  CABG but CTS states not a candidate at this time (see their note) 5. Carotid stenosis - needs CEA but vascular surgery also states not a candidate at this time (see their note)  I will followup again tomorrow. Please contact me if I can be of assistance in the meanwhile. Thank you for this consultation.  Chief Complaint: Nausea and vomiting  HPI:  Patient is an unfortunate 43 yo F who has been in the hospital for the past 40 days.  Her hospital stay began with an abnormal stress test during a work up for AV fistula with findings of ischemia and EF of 22%.  Patient went to cath lab, discovered 3 vessel disease with 100% blockage in RCA.  Unfortunately CTS felt she is not a candidate for surgery.  Next a carotid doppler study showed R side occlusion of 60-80% left side 80-100%, vascular surgery was consulted at that time and wont do surgery either.  Emergent CRRT was started on the 5/21, this would later be stopped then restarted on 6/4.  On 5/28 to try and help her EF an IABP was placed, unfortunately this would clot her right leg on the 30th and she would undergo fasciotomy on 6/1 also had thrombectomy and a wound vac that would get infected later and require debridement on 6/12.  Developed myoclonus on 6/12 for which she was started on keppra.  Fasciotomy broke open during a transfer fall on 6/16.  ABX during her stay have included vanc, zosyn, merrem at various points.  Tonight her CRRT was stopped at 5pm, she developed N/V 8:30 pm tonight, has had 3 episodes of vomiting, and 6 episodes of  diarrhea overnight.  Vomiting is described as food, NBNB.  Per RN smell has gotten more like C.Diff towards the end of the night.  Patient has received zofran 12mg  total.  Medicine has been consulted regarding her N/V overnight.  I spoke with the patient, she states that her N/V/D has improved finally at this time.  She states she does feel significantly better for the moment.  Review of Systems:  See above,  positive for back pain and groin pain (from wound)12 systems reviewed and otherwise negative.  Past Medical History  Diagnosis Date  . High cholesterol   . Diabetic retinopathy   . Peripheral neuropathy     "tips of toes"  . Blind right eye   . CHF (congestive heart failure)   . CAD (coronary artery disease)   . GERD (gastroesophageal reflux disease)   . Hypertension   . History of lung cancer 07/2011    s/p left lower lobectomy  . Diabetes mellitus     IDDM  . Cataract of right eye   . Ulcer of toe of right foot 07/10/2012    great toe  . Breast calcification, left 06/2012  . Acute biphenotypic leukemia   . CKD (chronic kidney disease) stage 5, GFR less than 15 ml/min   . Nephrotic syndrome   . Gastritis     H/o gastritis on prior endoscopy  . Anemia   . Carotid artery disease    Past Surgical History  Procedure Laterality Date  . Cardiac catheterization  07/16/2011  . Incision and drainage breast abscess Left   . Tubal ligation  1994  . Vitrectomy  2010    2 on left, 1 on right  . Cesarean section  1991; 1994  . Video assisted thoracoscopy (vats)/ lobectomy Left 07/30/2011    left main thoracotomy, left lower lobectomy, mediastinal lymph node dissection  . Lobectomy    . Colonoscopy with esophagogastroduodenoscopy (egd) N/A 08/14/2012    Procedure: COLONOSCOPY WITH ESOPHAGOGASTRODUODENOSCOPY (EGD);  Surgeon: Danie Binder, MD;  [facility];  Service: Endoscopy;  Laterality: N/A;  10:45-moved to [address] to notify pt  . Breast lumpectomy with needle localization Left 11/14/2012    Procedure: BREAST LUMPECTOMY WITH NEEDLE LOCALIZATION;  Surgeon: Marcello Moores A. Cornett, MD;  Location: [place];  Service: General;  Laterality: Left;  . Insertion of dialysis catheter Left 09/12/2013    Procedure: INSERTION OF DIALYSIS CATHETER;  Surgeon: Mal Misty, MD;  Location: [place];  Service: Vascular;  Laterality: Left;  . Embolectomy Right 09/17/2013    Procedure: Thrombectomy  of Right Common Femoral Artery;  Surgeon: Elam Dutch, MD;  Location: [facility];  Service: Vascular;  Laterality: Right;  . Endarterectomy femoral Right 09/17/2013    Procedure: Right Femoral Endarterectomy;  Surgeon: Elam Dutch, MD;  Location: [facility] OR;  Service: Vascular;  Laterality: Right;  . Patch angioplasty Right 09/17/2013    Procedure: Vein Patch Angioplasty of Right Femoral Artery;  Surgeon: Elam Dutch, MD;  Location: [place];  Service: Vascular;  Laterality: Right;  . Fasciotomy Right 09/17/2013    Procedure: Four Compartment Fasciotomy;  Surgeon: Elam Dutch, MD;  Location: [place];  Service: Vascular;  Laterality: Right;  . Fasciotomy closure Right 09/19/2013    Procedure: FASCIOTOMY CLOSURE;  Surgeon: Elam Dutch, MD;  Location: [place];  Service: Vascular;  Laterality: Right;  regional block and monitored anesthesia care used  . I&d extremity Right 09/28/2013    Procedure:  IRRIGATION AND DEBRIDEMENT EXTREMITY;  Surgeon: Serafina Mitchell, MD;  Location: [facility] OR;  Service: Vascular;  Laterality: Right;  . Application of wound vac Right 09/28/2013    Procedure: APPLICATION OF WOUND VAC;  Surgeon: Serafina Mitchell, MD;  Location: [facility];  Service: Vascular;  Laterality: Right;   Social History:  reports that she quit smoking about 2 years ago. She has never used smokeless tobacco. She reports that she does not drink alcohol or use illicit drugs.  Allergies  Allergen Reactions  . Crestor [Rosuvastatin] Other (See Comments)    Severe muscle weakness  . Nsaids Other (See Comments)    Not allergic, "bad on my kidneys"  . 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 Ciprofloxacin Rash   Family History  Problem Relation Age of Onset  . Coronary artery disease Father   . Asthma Father   . COPD Father   . Hypertension Father   . Hyperlipidemia Father   . Diabetes Father   . Congestive Heart Failure Father   . Heart disease Father     before age 40  . Heart attack Father   . Peripheral  vascular disease Father   . Hypertension Mother   . Hyperlipidemia Mother   . Diabetes Mother   . Cancer Mother   . Cancer Maternal Aunt      three aunts, bone, breast, ?  . Hypertension Brother   . Diabetes Brother   . Diabetes Sister   . Colon cancer Neg Hx   . Celiac disease Neg Hx   . Crohn's disease Neg Hx   . Ulcerative colitis Neg Hx   . Lung cancer Maternal Grandmother     Prior to Admission medications   Medication Sig Start Date End Date Taking? Authorizing Provider  acetaminophen (TYLENOL) 500 MG tablet Take 500 mg by mouth every 8 (eight) hours as needed for mild pain.   Yes Historical Provider, MD  aspirin EC 81 MG EC tablet Take 1 tablet (81 mg total) by mouth daily. 08/17/13  Yes Belkys A Regalado, MD  cholecalciferol (VITAMIN D) 1000 UNITS tablet Take 1,000 Units by mouth daily.   Yes Historical Provider, MD  cyclobenzaprine (FLEXERIL) 10 MG tablet Take 5-10 mg by mouth 3 (three) times daily as needed for muscle spasms. 07/06/13  Yes Alycia Rossetti, MD  Insulin Detemir (LEVEMIR FLEXPEN) 100 UNIT/ML Pen Inject 34 Units into the skin every evening. 08/17/13  Yes Belkys A Regalado, MD  insulin lispro (HUMALOG KWIKPEN) 100 UNIT/ML KiwkPen Inject 0.03 mLs (3 Units total) into the skin 3 (three) times daily with meals. 08/17/13  Yes Belkys A Regalado, MD  methylcellulose (ARTIFICIAL TEARS) 1 % ophthalmic solution Place 1 drop into both eyes at bedtime as needed (dry eyes).   Yes Historical Provider, MD  metolazone (ZAROXOLYN) 5 MG tablet Take 1 tablet (5 mg total) by mouth daily as needed. 08/17/13  Yes Belkys A Regalado, MD  metoprolol succinate (TOPROL-XL) 25 MG 24 hr tablet Take 1 tablet (25 mg total) by mouth daily. 07/08/13  Yes Alycia Rossetti, MD  potassium chloride SA (K-DUR,KLOR-CON) 20 MEQ tablet Take 1 tablet (20 mEq total) by mouth 2 (two) times daily. 08/17/13  Yes Belkys A Regalado, MD  pravastatin (PRAVACHOL) 20 MG tablet Take 1 tablet (20 mg total) by mouth daily at 6  PM. 08/17/13  Yes Belkys A Regalado, MD  torsemide (DEMADEX) 20 MG tablet Take 2 tablets (40 mg total) by mouth 2 (  two) times daily. 08/17/13  Yes Belkys A Regalado, MD   Physical Exam: Blood pressure 103/66, pulse 37, temperature 97.5 F (36.4 C), temperature source Oral, resp. rate 18, height 5' 7.5" (1.715 m), weight 125.2 kg (276 lb 0.3 oz), last menstrual period 08/19/2013, SpO2 98.00%. Filed Vitals:   10/13/13 0400  BP:   Pulse: 37  Temp: 97.5 F (36.4 C)  Resp: 18     General:  NAD, resting comfortably in bed  Eyes: PEERLA EOMI  ENT: mucous membranes moist  Neck: supple w/o JVD  Cardiovascular: RRR w/o MRG  Respiratory: CTA B  Abdomen: soft, nt, nd, bs+  Skin: no rash nor lesion  Musculoskeletal: MAE, full ROM all 4 extremities, fasciotomy wound dressing in place.  Psychiatric: normal tone and affect  Neurologic: AAOx3, grossly non-focal  Labs on Admission:  Basic Metabolic Panel:  Recent Labs Lab 10/09/13 0414  10/10/13 0322 10/10/13 1600 10/11/13 0400 10/11/13 1600 10/12/13 0400 10/13/13 0325  NA 135*  < > 137 134* 136* 135* 135* 134*  K 4.9  < > 4.8 4.9 5.0 5.2 4.6 4.5  CL 98  < > 100 98 101 98 98 97  CO2 27  < > 25 26 26 26 26 25   GLUCOSE 188*  < > 96 181* 148* 178* 112* 137*  BUN 18  < > 21 22 21 23 21  29*  CREATININE 1.97*  < > 2.12* 2.21* 2.26* 2.29* 1.93* 2.45*  CALCIUM 8.4  < > 8.4 8.0* 8.1* 7.8* 8.1* 7.8*  MG 2.5  --  2.4  --  2.5  --  2.3 2.3  PHOS 2.8  < > 2.5 2.5 2.6 2.4 1.8* 3.0  < > = values in this interval not displayed. Liver Function Tests:  Recent Labs Lab 10/10/13 1600 10/11/13 0400 10/11/13 1600 10/12/13 0400 10/13/13 0325  ALBUMIN 1.9* 1.9* 2.0* 1.9* 2.0*   No results found for this basename: LIPASE, AMYLASE,  in the last 168 hours No results found for this basename: AMMONIA,  in the last 168 hours CBC:  Recent Labs Lab 10/09/13 0414 10/10/13 0322 10/11/13 0400 10/12/13 0400 10/13/13 0325  WBC 6.0 5.6 5.3 5.4  7.7  HGB 7.6* 8.1* 7.9* 7.5* 8.0*  HCT 25.0* 27.0* 25.9* 24.7* 26.4*  MCV 97.3 95.1 95.6 95.4 95.3  PLT 240 270 241 253 271   Cardiac Enzymes: No results found for this basename: CKTOTAL, CKMB, CKMBINDEX, TROPONINI,  in the last 168 hours BNP: No components found with this basename: POCBNP,  CBG:  Recent Labs Lab 10/11/13 2144 10/12/13 0735 10/12/13 1146 10/12/13 1732 10/12/13 2217  GLUCAP 116* 106* 169* 176* 116*    Radiological Exams on Admission: No results found.  EKG: Independently reviewed.  Time spent: 100 min  GARDNER, JARED M. [facility] Pager [phone]  If 7PM-7AM, please contact night-coverage [email or web address] Password [facility] 10/13/2013, 5:06 AM

## 2013-10-13 NOTE — Progress Notes (Signed)
Paged cardiology as pt still "feels bad".  Received called back from Dr. Radford Pax.  Received 1 time additional 4mg  Zofran IV.  Will give med and continue to monitor.

## 2013-10-13 NOTE — Progress Notes (Signed)
Pt requested BSC.  Pt again with diarrhea.  Cdiff sample sent per protocol.  Will continue to monitor.

## 2013-10-13 NOTE — Progress Notes (Signed)
ANTICOAGULATION CONSULT NOTE - Follow Up Consult  Pharmacy Consult for Coumadin Indication: Thromboembolic state (RUE DVT)  Allergies  Allergen Reactions  . Crestor [Rosuvastatin] Other (See Comments)    Severe muscle weakness  . Nsaids Other (See Comments)    Not allergic, "bad on my kidneys"  . 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 Ciprofloxacin Rash    Patient Measurements: Height: 5' 7.5" (171.5 cm) Weight: 272 lb 0.8 oz (123.4 kg) IBW/kg (Calculated) : 62.75  Vital Signs: Temp: 97.5 F (36.4 C) (06/27 0400) Temp src: Oral (06/27 0400) BP: 77/47 mmHg (06/27 1100) Pulse Rate: 110 (06/27 1100)  Labs:  Recent Labs  10/11/13 0400 10/11/13 1215 10/11/13 1600 10/12/13 0400 10/13/13 0325  HGB 7.9*  --   --  7.5* 8.0*  HCT 25.9*  --   --  24.7* 26.4*  PLT 241  --   --  253 271  APTT 75*  --   --   --   --   LABPROT 40.1* 31.3*  --  35.2* 36.5*  INR 4.15* 3.02*  --  3.51* 3.68*  CREATININE 2.26*  --  2.29* 1.93* 2.45*    Estimated Creatinine Clearance: 39.8 ml/min (by C-G formula based on Cr of 2.45).  Assessment: 43yof continues on coumadin for RUE DVT. She has completed 5 days of overlap therapy with argatroban. Argatroban was stopped 6/25 due to therapeutic INR. INR (3.68) remains supratherapeutic - will continue to hold Coumadin tonight. Expect INR to begin to drop tomorrow and restart Coumadin. - H/H and Plts stable - No significant bleeding reported  Goal of Therapy: INR 2-3 Monitor platelets by anticoagulation protocol: Yes   Plan:  1. No Coumadin tonight 2. Follow-up AM INR   Earleen Newport [phone] 10/13/2013,11:11 AM  ICU

## 2013-10-13 NOTE — Progress Notes (Signed)
Seen by my partner early this morning.  Pt sleeping comfortably. C diff negative. No further N/V/D.    Doree Barthel, MD [facility]

## 2013-10-13 NOTE — Progress Notes (Signed)
Hypoglycemic Event  CBG: 56  Treatment: 15 GM carbohydrate snack  Symptoms: None  Follow-up CBG: Time: 2240 CBG Result: 95  Possible Reasons for Event: Inadequate meal intake  Comments/MD notified :MD aware of hypoglycemic events    Hannah [name]  Remember to initiate Hypoglycemia Order Set & complete

## 2013-10-13 NOTE — Progress Notes (Signed)
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 Developed cardiogenic shock, cold foot after IABP requiring femoral thrombectomy and fasciotomy (6/1), VT/VF then PEA arrest 6/3. She required initiation of RRT 09/06/13 and has remained dialysis dependent.  Short trial of IHD unsuccessful and back to CRRT as of 6/4 with inotrope requirement and inability to wean. Another failure of trying to wean off CRRT   Assessment/Recommendations  1. Dialysis dependent anuric AoCKD5, now ESRD, have cont CRRT (Dialysis dependent since 5/21; current access is left TDC placed 5/27;  5/21 CRRT->IHD 5/26 ->back to CRRT 6/4 ; weight down 131->nadir of 113.6 with CRRT->allowed to drift back up some -     trying for cvp closer to 10.   We were trying to maintain BP off inotropes in order to transition to intermittent HD. However, I think we need to try and transition as she is not going to "give up" and may continue to be dobutamine dependent.  Transition from yesterday seems to be a failure but not sure was put back on for absolute needs.  In any event, we do not want to make this transition when she is feeling poorly.  Plan for future is complicated.  placement of PD catheter would carry great risk of infection due to proximity of the groin wound so possibility home HD with Nxstage ??  First we need to make sure she will tolerate IHD, then the task of training without having to do any OP HD as likely wlll be dobutamine dependent.? 2. Hypophosphatemia - intermittent replacement- given yesterday 3. Cardiogenic shock, Ischemic CM  and not a CABG candidate; EF 22% by myoview; 30-35 by echo as per #1; on dobutamine,midodrine and florinef-  NE off .   Trying to wean dobutamine and so far stable today 4. HIT, on argatroban- will rerun test 5. S/p R femoral endarterectomy wound complications- wound VAC in place; maybe a little  more dehiscence today. On vanco and meropenam with VAC changes per VVS.  6. CAD, ischemic CM 7. Anemia - on darbe 200/week;  iron studies appear "adequate" ; s/p 2 units for Hb of 6.9 (6/19)- back on anticoagulation- may need another transfusion 8. Twitching/myoclonus - ATB changed , now starting keppra per neurology- is worse- I cannot explain??? All elytes are OK 9. Lung CA with prior LL Lobectomy 10. Right axillary DVT 11. Bilateral carotid stenosis 12. Diabetes - insulin 13. Deconditioning- PT  14. New onset NV/D- rule out C diff- symptomatic treatment  Subjective: Events noted- seems that patient had N/V/D and felt bloated- given symptomatic treatment and CRRT restarted  ( off only from 5PM to 1AM) Is a little better this AM wth zofran- C diff pending  Objective: Vital signs in last 24 hours: Temp:  [97.5 F (36.4 C)-98.5 F (36.9 C)] 97.5 F (36.4 C) (06/27 0400) Pulse Rate:  [37-120] 111 (06/27 0700) Resp:  [9-26] 9 (06/27 0700) BP: (90-126)/(50-94) 114/83 mmHg (06/27 0700) SpO2:  [95 %-100 %] 95 % (06/27 0700) Weight:  [123.4 kg (272 lb 0.8 oz)] 123.4 kg (272 lb 0.8 oz) (06/27 0500) Weight change: -1.8 kg (-3 lb 15.5 oz)  Current CRRT Prescription:  Start Date: restarted 10/12/13 Catheter: TDC left BFR: 200  Pre Blood Pump: 300 DFR: 1500  Replacement Rate: 200  Goal UF      25-50 off  Per  hour    Anticoagulation: argatroban systemic, clotted on heparin, +HIT  Clotting: not excessive  WEIGHT TRENDING 6/27 123.4 6/25 125 10/09/13 116.5 10/08/13 119.5 10/07/13 0630 119.8 kg  10/06/13 0700 117.9 kg 10/05/13 0500 117.2 kg (repeated - same) 10/04/13 0500 113.9 kg CRRT changed to "keep volume even" fluid removal goal 10/03/13 0500 113.6 kg  10/02/13 0500 114.5 kg  10/01/13 0648 114.6 kg  09/30/13 0500 117.6 kg  09/29/13 0600 121.7 kg  09/28/13 0600 123.8 kg 09/27/13 0500 127.2 kg  09/26/13 0500 130.9 kg  09/25/13 0500 133.5 kg 09/24/13 0600 134.5 kg  09/23/13  0700 132.8 kg  09/22/13 0500 131.1 kg  09/21/13 0500 129.6 kg  09/20/13 0500 131 kg   CRRT restarted -50-100/hour  PHYSICAL EXAMINATION General appearance: alert and cooperative, sitting up in bed, NAD BP 114/83  Pulse 111  Temp(Src) 97.5 F (36.4 C) (Oral)  Resp 9  Ht 5' 7.5" (1.715 m)  Wt 123.4 kg (272 lb 0.8 oz)  BMI 41.96 kg/m2  SpO2 95%  LMP 08/19/2013 CVP 10-13 Alert,oriented, pleasant  Central line R and TDC L Lungs clear to auscultation bilaterally anteriorly   [facility] dressing dry and intact left chest Cardio: regular rate and rhythm S1, S2 normal, no murmur, click, rub or gallop GI: soft, non-tender; bowel sounds normal Extremities: 1+ edema to thighs Wound VAC in place R Groin; stapled incisions clean and dry on LE's Dusky tips of toes right foot #3-5 unchanged  Intake/Output from previous day: 06/14 0701 - 06/15 0700 In: 998.1 [I.V.:623.1; IV Piggyback:375] Out: 5433 [Drains:100]  Recent Labs  10/12/13 0400 10/13/13 0325  NA 135* 134*  K 4.6 4.5  CL 98 97  CO2 26 25  GLUCOSE 112* 137*  BUN 21 29*  CREATININE 1.93* 2.45*  CALCIUM 8.1* 7.8*  MG 2.3 2.3  PHOS 1.8* 3.0    Recent Labs  10/13/13 0325 10/13/13 0600  AST  --  20  ALT  --  15  ALKPHOS  --  138*  BILITOT  --  0.3  PROT  --  5.6*  ALBUMIN 2.0* 2.0*    Recent Labs  10/12/13 0400 10/13/13 0325  WBC 5.4 7.7  HGB 7.5* 8.0*  HCT 24.7* 26.4*  MCV 95.4 95.3  PLT 253 271   Results for DARNICE, COMRIE (MRN [id number]) as of 10/02/2013 09:13  Ref. Range 10/01/2013 08:30  Iron Latest Range: 42-135 ug/dL 45  UIBC Latest Range: 125-400 ug/dL 164  TIBC Latest Range: 250-470 ug/dL 209 (L)  Saturation Ratios Latest Range: 20-55 % 22  Ferritin Latest Range: 10-291 ng/mL 399 (H)    Scheduled Medications: . aspirin  81 mg Oral Daily  . atorvastatin  80 mg Oral q1800  . calcitRIOL  0.25 mcg Oral Daily  . darbepoetin (ARANESP) injection - NON-DIALYSIS  200 mcg Subcutaneous Q Wed-1800  .  feeding supplement (NEPRO CARB STEADY)  237 mL Oral TID WC  . fludrocortisone  0.1 mg Oral Daily  . insulin aspart  0-15 Units Subcutaneous TID WC  . insulin aspart  0-5 Units Subcutaneous QHS  . insulin aspart  6 Units Subcutaneous TID WC  . insulin glargine  40 Units Subcutaneous Q24H  . levETIRAcetam  1,000 mg Oral BID  . lidocaine (PF)  5 mL Other Once  . midodrine  10 mg Oral TID WC  . pantoprazole  40 mg Oral Daily  . polyethylene glycol  17 g Oral Daily  . Warfarin - Pharmacist Dosing Inpatient  Does not apply q1800  Infusion Medicications . sodium chloride Stopped (10/01/13 2000)  . DOBUTamine 4 mcg/kg/min (10/12/13 0325)  . dialysis replacement fluid (prismasate) 300 mL/hr at 10/13/13 0101  . dialysis replacement fluid (prismasate) 200 mL/hr at 10/13/13 0101  . dialysate (PRISMASATE) 1,500 mL/hr at 10/13/13 0803    LOS: 40 days   GOLDSBOROUGH,KELLIE A 10/13/2013,8:12 AM

## 2013-10-13 NOTE — Progress Notes (Signed)
Per Dr. Aron Baba verbal order, CRRT restarted per previous orders.  10cc of waste drawn back from each port of HD cath to removed indwelling Na Citrate.  Will continue to monitor pt.

## 2013-10-13 NOTE — Progress Notes (Signed)
Pt continues to have nausea.  Pt vomited again.  Page Dr. Radford Pax with cardiology.  Received call back from Dr. Radford Pax.  She states that she is going to consult [facility].  Will continue to monitor pt closely.

## 2013-10-13 NOTE — Progress Notes (Signed)
Subjective:  Developed bloating, diarrhea and nausea and was placed back on dialysis last evening. Not particular short of breath right now but doesn't feel well.  Objective:  Vital Signs in the last 24 hours: BP 77/47  Pulse 110  Temp(Src) 97.6 F (36.4 C) (Oral)  Resp 19  Ht 5' 7.5" (1.715 m)  Wt 123.4 kg (272 lb 0.8 oz)  BMI 41.96 kg/m2  SpO2 100%  LMP 08/19/2013  Physical Exam: Obese white female currently lying in bed in no acute distress Lungs:  Clear  Cardiac:  Regular rhythm, normal S1 and S2, S3 gallop noted Abdomen:  Soft, nontender, no masses Extremities:  No edema present  Intake/Output from previous day: 06/26 0701 - 06/27 0700 In: 1955 [P.O.:1537; I.V.:168; IV Piggyback:250] Out: 2676  Weight Filed Weights   10/11/13 2100 10/12/13 0500 10/13/13 0500  Weight: 125.2 kg (276 lb 0.3 oz) 125.2 kg (276 lb 0.3 oz) 123.4 kg (272 lb 0.8 oz)    Lab Results: Basic Metabolic Panel:  Recent Labs  10/12/13 0400 10/13/13 0325  NA 135* 134*  K 4.6 4.5  CL 98 97  CO2 26 25  GLUCOSE 112* 137*  BUN 21 29*  CREATININE 1.93* 2.45*    CBC:  Recent Labs  10/12/13 0400 10/13/13 0325  WBC 5.4 7.7  HGB 7.5* 8.0*  HCT 24.7* 26.4*  MCV 95.4 95.3  PLT 253 271    BNP    Component Value Date/Time   PROBNP 28622.0* 09/06/2013 0740    PROTIME: Lab Results  Component Value Date   INR 3.68* 10/13/2013   INR 3.51* 10/12/2013   INR 3.02* 10/11/2013    Telemetry: Sinus rhythm currently  Assessment/Plan:  1. 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 End-stage renal disease on CVVHD 3. Acute on chronic systolic heart failure 4. Ischemic right foot 5. Anemia  Recommendations:  Continue dobutamine. She is very tenuous.   Kerry Hough  MD [facility]  10/13/2013, 11:37 AM

## 2013-10-14 DIAGNOSIS — E1149 Type 2 diabetes mellitus with other diabetic neurological complication: Secondary | ICD-10-CM

## 2013-10-14 LAB — RENAL FUNCTION PANEL
ALBUMIN: 2 g/dL — AB (ref 3.5–5.2)
BUN: 19 mg/dL (ref 6–23)
CALCIUM: 7.9 mg/dL — AB (ref 8.4–10.5)
CO2: 28 mEq/L (ref 19–32)
CREATININE: 2.07 mg/dL — AB (ref 0.50–1.10)
Chloride: 99 mEq/L (ref 96–112)
GFR calc Af Amer: 32 mL/min — ABNORMAL LOW (ref >90)
GFR calc non Af Amer: 28 mL/min — ABNORMAL LOW (ref >90)
Glucose, Bld: 46 mg/dL — ABNORMAL LOW (ref 70–99)
Phosphorus: 2.2 mg/dL — ABNORMAL LOW (ref 2.3–4.6)
Potassium: 4.4 mEq/L (ref 3.7–5.3)
Sodium: 137 mEq/L (ref 137–147)

## 2013-10-14 LAB — GLUCOSE, CAPILLARY
GLUCOSE-CAPILLARY: 95 mg/dL (ref 70–99)
GLUCOSE-CAPILLARY: 82 mg/dL (ref 70–99)
GLUCOSE-CAPILLARY: 120 mg/dL — AB (ref 70–99)
Glucose-Capillary: 56 mg/dL — ABNORMAL LOW (ref 70–99)
Glucose-Capillary: 140 mg/dL — ABNORMAL HIGH (ref 70–99)
Glucose-Capillary: 120 mg/dL — ABNORMAL HIGH (ref 70–99)

## 2013-10-14 LAB — CBC
HEMATOCRIT: 26.2 % — AB (ref 36.0–46.0)
HEMOGLOBIN: 8 g/dL — AB (ref 12.0–15.0)
MCH: 29.5 pg (ref 26.0–34.0)
MCHC: 30.5 g/dL (ref 30.0–36.0)
MCV: 96.7 fL (ref 78.0–100.0)
Platelets: 242 10*3/uL (ref 150–400)
RBC: 2.71 MIL/uL — ABNORMAL LOW (ref 3.87–5.11)
RDW: 17.9 % — ABNORMAL HIGH (ref 11.5–15.5)
WBC: 4.1 10*3/uL (ref 4.0–10.5)

## 2013-10-14 LAB — PROTIME-INR
INR: 3.96 — ABNORMAL HIGH (ref 0.00–1.49)
PROTHROMBIN TIME: 38.7 s — AB (ref 11.6–15.2)

## 2013-10-14 LAB — CARBOXYHEMOGLOBIN
Carboxyhemoglobin: 1.9 % — ABNORMAL HIGH (ref 0.5–1.5)
METHEMOGLOBIN: 0.6 % (ref 0.0–1.5)
O2 Saturation: 62.1 %
Total hemoglobin: 7.9 g/dL — ABNORMAL LOW (ref 12.0–16.0)

## 2013-10-14 LAB — MAGNESIUM: MAGNESIUM: 2.3 mg/dL (ref 1.5–2.5)

## 2013-10-14 MED ORDER — ANTICOAGULANT SODIUM CITRATE 4% (200MG/5ML) IV SOLN
5.0000 mL | Status: DC | PRN
  Administered 2013-10-14: 4.6 mL via INTRAVENOUS
  Administered 2013-10-15 – 2013-10-20 (×3): 5 mL via INTRAVENOUS
  Administered 2013-10-20: 4.6 mL via INTRAVENOUS
  Administered 2013-10-24: 5 mL via INTRAVENOUS
  Filled 2013-10-14 (×8): qty 250

## 2013-10-14 NOTE — Progress Notes (Signed)
Subjective:  Sitting up in bed and looks really good this morning. No complaints of nausea or shortness of breath. No chest pain.  Objective:  Vital Signs in the last 24 hours: BP 107/72  Pulse 103  Temp(Src) 97.5 F (36.4 C) (Oral)  Resp 14  Ht 5' 7.5" (1.715 m)  Wt 122.2 kg (269 lb 6.4 oz)  BMI 41.55 kg/m2  SpO2 100%  LMP 08/19/2013  Physical Exam: Obese white female currently sitting in bed, alert and looks quite well. Lungs:  Clear  Cardiac:  Regular rhythm, normal S1 and S2, S3 gallop noted  Abdomen:  Soft, nontender, no masses Extremities:  No edema present  Intake/Output from previous day: 06/27 0701 - 06/28 0700 In: 1048 [P.O.:880; I.V.:168] Out: 1934 [Emesis/NG output:50] Weight Filed Weights   10/12/13 0500 10/13/13 0500 10/14/13 0500  Weight: 125.2 kg (276 lb 0.3 oz) 123.4 kg (272 lb 0.8 oz) 122.2 kg (269 lb 6.4 oz)    Lab Results: Basic Metabolic Panel:  Recent Labs  10/13/13 1515 10/14/13 0412  NA 136* 137  K 4.5 4.4  CL 98 99  CO2 26 28  GLUCOSE 65* 46*  BUN 24* 19  CREATININE 2.22* 2.07*    CBC:  Recent Labs  10/13/13 0325 10/14/13 0412  WBC 7.7 4.1  HGB 8.0* 8.0*  HCT 26.4* 26.2*  MCV 95.3 96.7  PLT 271 242    BNP    Component Value Date/Time   PROBNP 28622.0* 09/06/2013 0740    PROTIME: Lab Results  Component Value Date   INR 3.96* 10/14/2013   INR 3.68* 10/13/2013   INR 3.51* 10/12/2013    Telemetry: Sinus rhythm currently  Assessment/Plan:  1. 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 End-stage renal disease on CVVHD 3. Acute on chronic systolic heart failure 4. Ischemic right foot  5. Anemia  Recommendations:  She looks a lot better today. The plan is to try to get her off of CVVH today. I spoke to the nurse about the dobutamine and recommended that we keep it where it is today. We might be able to try to taper it but  think it is more important to get her  off of CVVH first. Co - ox up to 62 today.   Kerry Hough  MD [facility]  10/14/2013, 9:29 AM

## 2013-10-14 NOTE — Progress Notes (Signed)
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 Developed cardiogenic shock, cold foot after IABP requiring femoral thrombectomy and fasciotomy (6/1), VT/VF then PEA arrest 6/3. She required initiation of RRT 09/06/13 and has remained dialysis dependent.  Short trial of IHD unsuccessful and back to CRRT as of 6/4 with inotrope requirement and inability to wean. Another failure of trying to wean off CRRT on Friday  Assessment/Recommendations  1. Dialysis dependent anuric AoCKD5, now ESRD, have cont CRRT (Dialysis dependent since 5/21; current access is left TDC placed 5/27;  5/21 CRRT->IHD 5/26 ->back to CRRT 6/4 ; weight down 131->nadir of 113.6 with CRRT->allowed to drift back up some -     trying for cvp closer to 10.   We were trying to maintain BP off inotropes in order to transition to intermittent HD. However, I think we need to try and transition as she is not going to "give up" and will likely continue to be dobutamine dependent.  Transition from Friday seems to be a failure but not sure was put back on for absolute needs.   We did not want to make this transition when she was feeling poorly yesterday.  Will try to stop CRRT again today and plan for IHD tomorrow. Plan for future is complicated.  placement of PD catheter would carry great risk of infection due to proximity of the groin wound so possibility home HD with Nxstage ??  First we need to make sure she will tolerate IHD, then the task of training without having to do any OP HD as likely wlll be dobutamine dependent.? 2. Hypophosphatemia - intermittent replacement- given Friday 3. Cardiogenic shock, Ischemic CM  and not a CABG candidate; EF 22% by myoview; 30-35 by echo as per #1; on dobutamine,midodrine and florinef-  NE off .   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 S/p R femoral endarterectomy wound complications- wound VAC in place; maybe a little more dehiscence. Off abx 6. CAD, ischemic CM 7. Anemia - on darbe 200/week;  iron studies appear "adequate" ; s/p 2 units for Hb of 6.9 (6/19)- back on anticoagulation- hgb 8 today 8. Twitching/myoclonus - ATB changed , now keppra- is better 9. Lung CA with prior LL Lobectomy 10. Right axillary DVT 11. Bilateral carotid stenosis 12. Diabetes - insulin 13. Deconditioning- PT  14. New onset NV/D- rule out C diff- symptomatic treatment- resolved  Subjective: Events noted- feels better, C diff negative.  She wants to try again to come off CRRT   Objective: Vital signs in last 24 hours: Temp:  [97.5 F (36.4 C)-98.1 F (36.7 C)] 97.5 F (36.4 C) (06/28 0718) Pulse Rate:  [102-126] 103 (06/28 0700) Resp:  [9-21] 14 (06/28 0700) BP: (77-117)/(47-85) 107/72 mmHg (06/28 0700) SpO2:  [85 %-100 %] 100 % (06/28 0700) Weight:  [122.2 kg (269 lb 6.4 oz)] 122.2 kg (269 lb 6.4 oz) (06/28 0500) Weight change: -1.2 kg (-2 lb 10.3 oz)  Current CRRT Prescription:  Start Date: restarted 10/12/13 Catheter: TDC left BFR: 200  Pre Blood Pump: 300 DFR: 1500  Replacement Rate: 200  Goal UF      50-100 off  Per hour    Anticoagulation: argatroban systemic, clotted on heparin, +HIT  Clotting: not excessive  WEIGHT TRENDING 6/27 123.4 6/25  125 10/09/13 116.5 10/08/13 119.5 10/07/13 0630 119.8 kg  10/06/13 0700 117.9 kg 10/05/13 0500 117.2 kg (repeated - same) 10/04/13 0500 113.9 kg CRRT changed to "keep volume even" fluid removal goal 10/03/13 0500 113.6 kg  10/02/13 0500 114.5 kg  10/01/13 0648 114.6 kg  09/30/13 0500 117.6 kg  09/29/13 0600 121.7 kg  09/28/13 0600 123.8 kg 09/27/13 0500 127.2 kg  09/26/13 0500 130.9 kg  09/25/13 0500 133.5 kg 09/24/13 0600 134.5 kg  09/23/13 0700 132.8 kg  09/22/13 0500 131.1 kg  09/21/13 0500 129.6 kg  09/20/13 0500 131 kg   CRRT restarted  -50-100/hour  PHYSICAL EXAMINATION General appearance: alert and cooperative, sitting up in bed, NAD BP 107/72  Pulse 103  Temp(Src) 97.5 F (36.4 C) (Oral)  Resp 14  Ht 5' 7.5" (1.715 m)  Wt 122.2 kg (269 lb 6.4 oz)  BMI 41.55 kg/m2  SpO2 100%  LMP 08/19/2013 CVP 10-13 Alert,oriented, pleasant  Central line R and TDC L Lungs clear to auscultation bilaterally anteriorly   [facility] dressing dry and intact left chest Cardio: regular rate and rhythm S1, S2 normal, no murmur, click, rub or gallop GI: soft, non-tender; bowel sounds normal Extremities: 1+ edema to thighs Wound VAC in place R Groin; stapled incisions clean and dry on LE's Dusky tips of toes right foot #3-5 unchanged  Intake/Output from previous day: 06/14 0701 - 06/15 0700 In: 998.1 [I.V.:623.1; IV Piggyback:375] Out: 5433 [Drains:100]  Recent Labs  10/13/13 0325 10/13/13 1515 10/14/13 0412  NA 134* 136* 137  K 4.5 4.5 4.4  CL 97 98 99  CO2 25 26 28   GLUCOSE 137* 65* 46*  BUN 29* 24* 19  CREATININE 2.45* 2.22* 2.07*  CALCIUM 7.8* 8.1* 7.9*  MG 2.3  --  2.3  PHOS 3.0 2.1* 2.2*    Recent Labs  10/13/13 0600 10/13/13 1515 10/14/13 0412  AST 20  --   --   ALT 15  --   --   ALKPHOS 138*  --   --   BILITOT 0.3  --   --   PROT 5.6*  --   --   ALBUMIN 2.0* 2.0* 2.0*    Recent Labs  10/13/13 0325 10/14/13 0412  WBC 7.7 4.1  HGB 8.0* 8.0*  HCT 26.4* 26.2*  MCV 95.3 96.7  PLT 271 242   Results for SHAYLEN, NEPHEW (MRN [id number]) as of 10/02/2013 09:13  Ref. Range 10/01/2013 08:30  Iron Latest Range: 42-135 ug/dL 45  UIBC Latest Range: 125-400 ug/dL 164  TIBC Latest Range: 250-470 ug/dL 209 (L)  Saturation Ratios Latest Range: 20-55 % 22  Ferritin Latest Range: 10-291 ng/mL 399 (H)    Scheduled Medications: . aspirin  81 mg Oral Daily  . atorvastatin  80 mg Oral q1800  . calcitRIOL  0.25 mcg Oral Daily  . darbepoetin (ARANESP) injection - NON-DIALYSIS  200 mcg Subcutaneous Q Wed-1800  .  feeding supplement (NEPRO CARB STEADY)  237 mL Oral TID WC  . fludrocortisone  0.1 mg Oral Daily  . insulin aspart  0-15 Units Subcutaneous TID WC  . insulin aspart  0-5 Units Subcutaneous QHS  . insulin aspart  6 Units Subcutaneous TID WC  . insulin glargine  40 Units Subcutaneous Q24H  . levETIRAcetam  1,000 mg Oral BID  . lidocaine (PF)  5 mL Other Once  . midodrine  10 mg Oral TID WC  . pantoprazole  40 mg Oral Daily  . polyethylene glycol  17  g Oral Daily  . sodium chloride  10-40 mL Intracatheter Q12H  . Warfarin - Pharmacist Dosing Inpatient   Does not apply q1800  Infusion Medicications . sodium chloride Stopped (10/01/13 2000)  . DOBUTamine 4 mcg/kg/min (10/14/13 0700)  . dialysis replacement fluid (prismasate) 300 mL/hr at 10/13/13 1806  . dialysis replacement fluid (prismasate) 200 mL/hr at 10/14/13 0250  . dialysate (PRISMASATE) 1,500 mL/hr at 10/14/13 0735    LOS: 41 days   GOLDSBOROUGH,KELLIE A 10/14/2013,7:57 AM

## 2013-10-14 NOTE — Progress Notes (Signed)
ANTICOAGULATION CONSULT NOTE - Follow Up Consult  Pharmacy Consult for Coumadin Indication: Thromboembolic state (RUE DVT)  Allergies  Allergen Reactions  . Crestor [Rosuvastatin] Other (See Comments)    Severe muscle weakness  . Nsaids Other (See Comments)    Not allergic, "bad on my kidneys"  . 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 Ciprofloxacin Rash    Patient Measurements: Height: 5' 7.5" (171.5 cm) Weight: 269 lb 6.4 oz (122.2 kg) IBW/kg (Calculated) : 62.75  Vital Signs: Temp: 97.5 F (36.4 C) (06/28 0718) Temp src: Oral (06/28 0718) BP: 107/72 mmHg (06/28 0700) Pulse Rate: 103 (06/28 0700)  Labs:  Recent Labs  10/12/13 0400 10/13/13 0325 10/13/13 1515 10/14/13 0412  HGB 7.5* 8.0*  --  8.0*  HCT 24.7* 26.4*  --  26.2*  PLT 253 271  --  242  LABPROT 35.2* 36.5*  --  38.7*  INR 3.51* 3.68*  --  3.96*  CREATININE 1.93* 2.45* 2.22* 2.07*    Estimated Creatinine Clearance: 46.9 ml/min (by C-G formula based on Cr of 2.07).  Assessment: 43yof continues on coumadin for RUE DVT. She has completed 5 days of overlap therapy with argatroban. Argatroban was stopped 6/25 due to therapeutic INR. INR (3.9) remains supratherapeutic - will continue to hold Coumadin tonight. Expect INR to begin to drop soon and then can restart Coumadin. - H/H and Plts stable - No significant bleeding reported  Goal of Therapy: INR 2-3 Monitor platelets by anticoagulation protocol: Yes   Plan:  1. No Coumadin tonight 2. Follow-up AM INR   Erin Hearing PharmD., BCPS Clinical Pharmacist Pager [phone] 10/14/2013 9:44 AM

## 2013-10-14 NOTE — Progress Notes (Signed)
Hypoglycemic Event  CBG: 46 by lab Renal panel  Treatment: 15 GM carbohydrate snack  Symptoms: None  Follow-up CBG: Time 0600 CBG Result 82  Possible Reasons for Event: Medication regimen: Lantus 40u QD  Comments/MD notified: MD aware    Hannah [name]  Remember to initiate Hypoglycemia Order Set & complete

## 2013-10-14 NOTE — Progress Notes (Signed)
[facility]  Subjective  - Feels ok   Objective 107/72 103 97.5 F (36.4 C) (Oral) 14 100%  Intake/Output Summary (Last 24 hours) at 10/14/13 0813 Last data filed at 10/14/13 0800  Gross per 24 hour  Intake   1041 ml  Output   1979 ml  Net   -938 ml   Right groin-still with fibrinous exudate upper half of groin, lower half and base over vessels with granulation forming Lateral and medial fasciotomies granulating healthy appearance Foot pink and warm  Assessment/Planning: Slowly healing groin and fasciotomy wounds but some progress Will continue VAC on groin, will need more debridement at some point, hopefully if she improves overall Add VAC to fasciotomies  FIELDS,CHARLES E 10/14/2013 8:13 AM --  Laboratory Lab Results:  Recent Labs  10/13/13 0325 10/14/13 0412  WBC 7.7 4.1  HGB 8.0* 8.0*  HCT 26.4* 26.2*  PLT 271 242   BMET  Recent Labs  10/13/13 1515 10/14/13 0412  NA 136* 137  K 4.5 4.4  CL 98 99  CO2 26 28  GLUCOSE 65* 46*  BUN 24* 19  CREATININE 2.22* 2.07*  CALCIUM 8.1* 7.9*    COAG Lab Results  Component Value Date   INR 3.96* 10/14/2013   INR 3.68* 10/13/2013   INR 3.51* 10/12/2013   No results found for this basename: PTT

## 2013-10-14 NOTE — Progress Notes (Signed)
[facility] Progress Note [place] TEAM 1 - Stepdown/ICU TEAM   BLIMI GODBY ZDG:[id number] DOB: 08-08-1970 DOA: 09/03/2013 PCP: Vic Blackbird, MD  Brief narrative: Hannah [name] is a 43 y.o. female presenting on 09/03/2013 who has been in the hospital for the past 40 days. Her hospital stay began with an abnormal stress test during a work up for AV fistula with findings of ischemia and EF of 22%. Patient went to cath lab, discovered 3 vessel disease with 100% blockage in RCA. Unfortunately CTS felt she is not a candidate for surgery. Next a carotid doppler study showed R side occlusion of 60-80% left side 80-100%, vascular surgery was consulted at that time and wont do surgery either. Emergent CRRT was started on the 5/21, this would later be stopped then restarted on 6/4. On 5/28 to try and help her EF an IABP was placed, unfortunately this would clot her right leg on the 30th and she would undergo fasciotomy on 6/1 also had thrombectomy and a wound vac that would get infected later and require debridement on 6/12. Developed myoclonus on 6/12 for which she was started on keppra. Fasciotomy broke open during a transfer fall on 6/16. ABX during her stay have included vanc, zosyn, merrem at various points.  The hospitalist service was consulted for nausea, vomiting and diarrhea.    Subjective: Eating her lunch- tolerating solid food.   Assessment/Plan:    Nausea, vomiting, diarrhea - symptoms resolved -stool sent for C.Diff- negative - has not required zofran and phenergan since yesterday  - LFTs ordered normal except high alk phos    Cardiogenic shock - inotrope dependent, see cardiology's note, cardiology is primary on patient              ESRD - see nephrology's note, CVVHD currently running              3v CAD - needs CABG but CTS states not a candidate at this time (see their note)              Carotid stenosis - needs CEA but vascular surgery also states not a candidate at  this time               DM- resume Lantus this evening along with mealtimg novolog and sliding scale    We will sign off - please call if needed.   Antibiotics: Anti-infectives   Start     Dose/Rate Route Frequency Ordered Stop   10/11/13 2200  vancomycin (VANCOCIN) 500 mg in sodium chloride 0.9 % 100 mL IVPB     500 mg 100 mL/hr over 60 Minutes Intravenous Every 24 hours 10/10/13 2313 10/11/13 2300   10/05/13 1800  meropenem (MERREM) 1 g in sodium chloride 0.9 % 100 mL IVPB     1 g 200 mL/hr over 30 Minutes Intravenous Every 12 hours 10/05/13 1034 10/11/13 2230   10/04/13 2200  vancomycin (VANCOCIN) IVPB 750 mg/150 ml premix  Status:  Discontinued     750 mg 150 mL/hr over 60 Minutes Intravenous Every 24 hours 10/04/13 1438 10/10/13 2313   10/03/13 1400  piperacillin-tazobactam (ZOSYN) IVPB 2.25 g  Status:  Discontinued     2.25 g 100 mL/hr over 30 Minutes Intravenous 3 times per day 10/03/13 0930 10/05/13 0837   09/30/13 0600  piperacillin-tazobactam (ZOSYN) IVPB 3.375 g  Status:  Discontinued     3.375 g 12.5 mL/hr over 240 Minutes Intravenous 3 times per day 09/29/13 2246 10/03/13 0930  09/29/13 1400  vancomycin (VANCOCIN) IVPB 1000 mg/200 mL premix  Status:  Discontinued     1,000 mg 200 mL/hr over 60 Minutes Intravenous Every 24 hours 09/29/13 1348 10/04/13 1438   09/29/13 0600  piperacillin-tazobactam (ZOSYN) IVPB 3.375 g  Status:  Discontinued     3.375 g 100 mL/hr over 30 Minutes Intravenous 3 times per day 09/28/13 1940 09/29/13 2246   09/27/13 1200  vancomycin (VANCOCIN) 1,250 mg in sodium chloride 0.9 % 250 mL IVPB  Status:  Discontinued     1,250 mg 166.7 mL/hr over 90 Minutes Intravenous Every 24 hours 09/26/13 1033 09/29/13 1348   09/26/13 1200  piperacillin-tazobactam (ZOSYN) IVPB 3.375 g  Status:  Discontinued     3.375 g 100 mL/hr over 30 Minutes Intravenous 4 times per day 09/26/13 1033 09/28/13 1940   09/26/13 1200  vancomycin (VANCOCIN) 2,000 mg in sodium  chloride 0.9 % 500 mL IVPB     2,000 mg 250 mL/hr over 120 Minutes Intravenous  Once 09/26/13 1033 09/26/13 1319   09/18/13 1200  cefUROXime (ZINACEF) 1.5 g in dextrose 5 % 50 mL IVPB     1.5 g 100 mL/hr over 30 Minutes Intravenous Every 24 hours 09/17/13 1840 09/18/13 1200   09/18/13 0100  cefUROXime (ZINACEF) 1.5 g in dextrose 5 % 50 mL IVPB  Status:  Discontinued     1.5 g 100 mL/hr over 30 Minutes Intravenous Every 12 hours 09/17/13 1749 09/17/13 1840   09/17/13 1330  cefUROXime (ZINACEF) 1.5 g in dextrose 5 % 50 mL IVPB     1.5 g 100 mL/hr over 30 Minutes Intravenous To Surgery 09/17/13 1317 09/17/13 1348   09/12/13 0000  ceFAZolin (ANCEF) IVPB 1 g/50 mL premix    Comments:  Send with pt to OR   1 g 100 mL/hr over 30 Minutes Intravenous On call 09/11/13 1055 09/12/13 0100       Objective: Filed Weights   10/12/13 0500 10/13/13 0500 10/14/13 0500  Weight: 125.2 kg (276 lb 0.3 oz) 123.4 kg (272 lb 0.8 oz) 122.2 kg (269 lb 6.4 oz)   Blood pressure 107/72, pulse 103, temperature 99.4 F (37.4 C), temperature source Oral, resp. rate 14, height 5' 7.5" (1.715 m), weight 122.2 kg (269 lb 6.4 oz), last menstrual period 08/19/2013, SpO2 100.00%.  Intake/Output Summary (Last 24 hours) at 10/14/13 1459 Last data filed at 10/14/13 1400  Gross per 24 hour  Intake    799 ml  Output   2061 ml  Net  -1262 ml     Exam: General: No acute respiratory distress Lungs: Clear to auscultation bilaterally without wheezes or crackles Cardiovascular: Regular rate and rhythm without murmur gallop or rub normal S1 and S2 Abdomen: Nontender, nondistended, soft, bowel sounds positive, no rebound, no ascites, no appreciable mass Extremities: No significant cyanosis, clubbing, -right leg fasciotomy wound dressing in place-   Data Reviewed: Basic Metabolic Panel:  Recent Labs Lab 10/10/13 0322  10/11/13 0400 10/11/13 1600 10/12/13 0400 10/13/13 0325 10/13/13 1515 10/14/13 0412  NA 137  <  > 136* 135* 135* 134* 136* 137  K 4.8  < > 5.0 5.2 4.6 4.5 4.5 4.4  CL 100  < > 101 98 98 97 98 99  CO2 25  < > 26 26 26 25 26 28   GLUCOSE 96  < > 148* 178* 112* 137* 65* 46*  BUN 21  < > 21 23 21  29* 24* 19  CREATININE 2.12*  < > 2.26* 2.29*  1.93* 2.45* 2.22* 2.07*  CALCIUM 8.4  < > 8.1* 7.8* 8.1* 7.8* 8.1* 7.9*  MG 2.4  --  2.5  --  2.3 2.3  --  2.3  PHOS 2.5  < > 2.6 2.4 1.8* 3.0 2.1* 2.2*  < > = values in this interval not displayed. Liver Function Tests:  Recent Labs Lab 10/12/13 0400 10/13/13 0325 10/13/13 0600 10/13/13 1515 10/14/13 0412  AST  --   --  20  --   --   ALT  --   --  15  --   --   ALKPHOS  --   --  138*  --   --   BILITOT  --   --  0.3  --   --   PROT  --   --  5.6*  --   --   ALBUMIN 1.9* 2.0* 2.0* 2.0* 2.0*   No results found for this basename: LIPASE, AMYLASE,  in the last 168 hours No results found for this basename: AMMONIA,  in the last 168 hours CBC:  Recent Labs Lab 10/10/13 0322 10/11/13 0400 10/12/13 0400 10/13/13 0325 10/14/13 0412  WBC 5.6 5.3 5.4 7.7 4.1  HGB 8.1* 7.9* 7.5* 8.0* 8.0*  HCT 27.0* 25.9* 24.7* 26.4* 26.2*  MCV 95.1 95.6 95.4 95.3 96.7  PLT 270 241 253 271 242   Cardiac Enzymes: No results found for this basename: CKTOTAL, CKMB, CKMBINDEX, TROPONINI,  in the last 168 hours BNP (last 3 results)  Recent Labs  08/13/13 1949 09/06/13 0740  PROBNP 34187.0* 28622.0*   CBG:  Recent Labs Lab 10/13/13 2144 10/13/13 2240 10/14/13 0600 10/14/13 0715 10/14/13 1136  GLUCAP 56* 95 82 120* 140*    Recent Results (from the past 240 hour(s))  MRSA PCR SCREENING     Status: None   Collection Time    10/04/13  7:48 PM      Result Value Ref Range Status   MRSA by PCR NEGATIVE  NEGATIVE Final   Comment:            The GeneXpert MRSA Assay (FDA     approved for NASAL specimens     only), is one component of a     comprehensive MRSA colonization     surveillance program. It is not     intended to diagnose MRSA      infection nor to guide or     monitor treatment for     MRSA infections.  CLOSTRIDIUM DIFFICILE BY PCR     Status: None   Collection Time    10/13/13  4:01 AM      Result Value Ref Range Status   C difficile by pcr NEGATIVE  NEGATIVE Final     Studies:  Recent x-ray studies have been reviewed in detail by the Attending Physician  Scheduled Meds:  Scheduled Meds: . aspirin  81 mg Oral Daily  . atorvastatin  80 mg Oral q1800  . calcitRIOL  0.25 mcg Oral Daily  . darbepoetin (ARANESP) injection - NON-DIALYSIS  200 mcg Subcutaneous Q Wed-1800  . feeding supplement (NEPRO CARB STEADY)  237 mL Oral TID WC  . fludrocortisone  0.1 mg Oral Daily  . insulin aspart  0-15 Units Subcutaneous TID WC  . insulin aspart  0-5 Units Subcutaneous QHS  . insulin aspart  6 Units Subcutaneous TID WC  . insulin glargine  40 Units Subcutaneous Q24H  . levETIRAcetam  1,000 mg Oral BID  . lidocaine (PF)  5 mL Other Once  . midodrine  10 mg Oral TID WC  . pantoprazole  40 mg Oral Daily  . polyethylene glycol  17 g Oral Daily  . sodium chloride  10-40 mL Intracatheter Q12H  . Warfarin - Pharmacist Dosing Inpatient   Does not apply q1800   Continuous Infusions: . sodium chloride Stopped (10/01/13 2000)  . DOBUTamine 4 mcg/kg/min (10/14/13 0700)  . dialysis replacement fluid (prismasate) 300 mL/hr at 10/14/13 1249  . dialysis replacement fluid (prismasate) 200 mL/hr at 10/14/13 0250  . dialysate (PRISMASATE) 1,500 mL/hr at 10/14/13 1154    Time spent on care of this patient: 89 min   Churchill, MD  [facility] Office  [phone] Pager - Text Page per Amion as per below:  On-Call/Text Page:      [email or web address]  If 7PM-7AM, please contact night-coverage [email or web address] 10/14/2013, 2:59 PM   LOS: 41 days

## 2013-10-14 NOTE — Plan of Care (Signed)
Problem: Phase I Progression Outcomes Goal: Voiding-avoid urinary catheter unless indicated Outcome: Not Applicable Date Met:  62/82/41 Pt anuric

## 2013-10-14 NOTE — Plan of Care (Signed)
Problem: Phase II Progression Outcomes Goal: Walk in hall or up in chair TID Outcome: Progressing Out of bed to chair multiple times during day.  PT working with pt.

## 2013-10-14 NOTE — Progress Notes (Signed)
CVVHD stopped at 1700 per MD orders, patient's  HD Cath was flushed, then 2.2 cc of sodium citrate was injected to dwell in access (Red) port and 2.4cc of sodium citrate was injected to dwell in return [facility]) port.   One way caps applied and Do NOT Inject labels placed.

## 2013-10-15 LAB — RENAL FUNCTION PANEL
Albumin: 1.9 g/dL — ABNORMAL LOW (ref 3.5–5.2)
BUN: 24 mg/dL — ABNORMAL HIGH (ref 6–23)
CO2: 26 mEq/L (ref 19–32)
CREATININE: 3.01 mg/dL — AB (ref 0.50–1.10)
Calcium: 7.9 mg/dL — ABNORMAL LOW (ref 8.4–10.5)
Chloride: 97 mEq/L (ref 96–112)
GFR, EST AFRICAN AMERICAN: 20 mL/min — AB (ref >90)
GFR, EST NON AFRICAN AMERICAN: 18 mL/min — AB (ref >90)
Glucose, Bld: 160 mg/dL — ABNORMAL HIGH (ref 70–99)
Phosphorus: 3 mg/dL (ref 2.3–4.6)
Potassium: 4.6 mEq/L (ref 3.7–5.3)
SODIUM: 134 meq/L — AB (ref 137–147)

## 2013-10-15 LAB — GLUCOSE, CAPILLARY
GLUCOSE-CAPILLARY: 90 mg/dL (ref 70–99)
GLUCOSE-CAPILLARY: 106 mg/dL — AB (ref 70–99)
Glucose-Capillary: 225 mg/dL — ABNORMAL HIGH (ref 70–99)
Glucose-Capillary: 79 mg/dL (ref 70–99)
Glucose-Capillary: 166 mg/dL — ABNORMAL HIGH (ref 70–99)

## 2013-10-15 LAB — CBC
HCT: 25.8 % — ABNORMAL LOW (ref 36.0–46.0)
HEMOGLOBIN: 7.7 g/dL — AB (ref 12.0–15.0)
MCH: 28.6 pg (ref 26.0–34.0)
MCHC: 29.8 g/dL — ABNORMAL LOW (ref 30.0–36.0)
MCV: 95.9 fL (ref 78.0–100.0)
Platelets: 264 10*3/uL (ref 150–400)
RBC: 2.69 MIL/uL — AB (ref 3.87–5.11)
RDW: 17.9 % — ABNORMAL HIGH (ref 11.5–15.5)
WBC: 5.4 10*3/uL (ref 4.0–10.5)

## 2013-10-15 LAB — PROTIME-INR
INR: 3.75 — AB (ref 0.00–1.49)
Prothrombin Time: 37.1 seconds — ABNORMAL HIGH (ref 11.6–15.2)

## 2013-10-15 LAB — CARBOXYHEMOGLOBIN
Carboxyhemoglobin: 2.6 % — ABNORMAL HIGH (ref 0.5–1.5)
METHEMOGLOBIN: 0.9 % (ref 0.0–1.5)
O2 Saturation: 71.6 %
Total hemoglobin: 6.9 g/dL — CL (ref 12.0–16.0)

## 2013-10-15 LAB — HEPATITIS B SURFACE ANTIGEN: Hepatitis B Surface Ag: NEGATIVE

## 2013-10-15 LAB — MAGNESIUM: Magnesium: 2.5 mg/dL (ref 1.5–2.5)

## 2013-10-15 MED ORDER — SODIUM CHLORIDE 0.9 % IV SOLN: 100.0000 mL | INTRAVENOUS | Status: DC | PRN

## 2013-10-15 MED ORDER — ALBUMIN HUMAN 25 % IV SOLN
INTRAVENOUS | Status: AC
  Administered 2013-10-15: 25 g
  Filled 2013-10-15: qty 100

## 2013-10-15 MED ORDER — HEPARIN SODIUM (PORCINE) 1000 UNIT/ML DIALYSIS: 1000.0000 [IU] | INTRAMUSCULAR | Status: DC | PRN

## 2013-10-15 MED ORDER — ALTEPLASE 2 MG IJ SOLR: 2.0000 mg | Freq: Once | INTRAMUSCULAR | Status: DC | PRN

## 2013-10-15 MED ORDER — LIDOCAINE HCL (PF) 1 % IJ SOLN: 5.0000 mL | INTRAMUSCULAR | Status: DC | PRN

## 2013-10-15 MED ORDER — PENTAFLUOROPROP-TETRAFLUOROETH EX AERO: 1.0000 "application " | INHALATION_SPRAY | CUTANEOUS | Status: DC | PRN

## 2013-10-15 MED ORDER — ALBUMIN HUMAN 25 % IV SOLN
25.0000 g | INTRAVENOUS | Status: DC | PRN
  Filled 2013-10-15: qty 100

## 2013-10-15 MED ORDER — NEPRO/CARBSTEADY PO LIQD: 237.0000 mL | ORAL | Status: DC | PRN

## 2013-10-15 MED ORDER — ALTEPLASE 2 MG IJ SOLR
2.0000 mg | Freq: Once | INTRAMUSCULAR | Status: AC | PRN
  Filled 2013-10-15: qty 2

## 2013-10-15 MED ORDER — ALBUMIN HUMAN 25 % IV SOLN
INTRAVENOUS | Status: AC
  Administered 2013-10-15: 25 g
  Filled 2013-10-15: qty 200

## 2013-10-15 MED ORDER — LIDOCAINE-PRILOCAINE 2.5-2.5 % EX CREA
1.0000 "application " | TOPICAL_CREAM | CUTANEOUS | Status: DC | PRN
  Filled 2013-10-15: qty 5

## 2013-10-15 MED ORDER — LIDOCAINE-PRILOCAINE 2.5-2.5 % EX CREA: 1.0000 "application " | TOPICAL_CREAM | CUTANEOUS | Status: DC | PRN

## 2013-10-15 NOTE — Progress Notes (Signed)
ANTICOAGULATION CONSULT NOTE - Follow Up Consult  Pharmacy Consult for Coumadin Indication: Thromboembolic state (RUE DVT)  Allergies  Allergen Reactions  . Crestor [Rosuvastatin] Other (See Comments)    Severe muscle weakness  . Nsaids Other (See Comments)    Not allergic, "bad on my kidneys"  . 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 Ciprofloxacin Rash    Patient Measurements: Height: 5' 7.5" (171.5 cm) Weight: 271 lb (122.925 kg) IBW/kg (Calculated) : 62.75  Vital Signs: Temp: 98.5 F (36.9 C) (06/29 0400) Temp src: Oral (06/29 0400) BP: 102/67 mmHg (06/29 0700) Pulse Rate: 110 (06/29 0700)  Labs:  Recent Labs  10/13/13 0325 10/13/13 1515 10/14/13 0412 10/15/13 0346  HGB 8.0*  --  8.0* 7.7*  HCT 26.4*  --  26.2* 25.8*  PLT 271  --  242 264  LABPROT 36.5*  --  38.7* 37.1*  INR 3.68*  --  3.96* 3.75*  CREATININE 2.45* 2.22* 2.07* 3.01*    Estimated Creatinine Clearance: 32.3 ml/min (by C-G formula based on Cr of 3.01).  Assessment: 43yof continues on coumadin for RUE DVT. She has completed 5 days of overlap therapy with argatroban. Argatroban was stopped 6/25 due to therapeutic INR. INR (3.75) remains supratherapeutic - will continue to hold Coumadin tonight. Expect INR to begin to drop soon and then can restart Coumadin. - H/H and Plts fairly stable - No significant bleeding reported  Goal of Therapy: INR 2-3 Monitor platelets by anticoagulation protocol: Yes   Plan:  1. No Coumadin tonight 2. Follow-up AM INR   [place] Crane, Vena [place], BCPS  Clinical Pharmacist Pager [phone]  10/15/2013 8:09 AM

## 2013-10-15 NOTE — Progress Notes (Signed)
PT Cancellation Note  Patient Details Name: Hannah [name] MRN: [id number] DOB: 1971-01-08   Cancelled Treatment:    Reason Eval/Treat Not Completed: Patient at procedure or test/unavailable (Pt on HD)   [facility] 10/15/2013, 11:04 AM

## 2013-10-15 NOTE — Progress Notes (Signed)
Received call from respiratory in reference to panic Hgb of 6.9 on Co Ox. CBC showed Hbg to be 7.7. Will inform MD on morning rounds. Will continue to monitor pt.

## 2013-10-15 NOTE — Progress Notes (Signed)
Patient ID: Hannah [name], female   DOB: December 12, 1970, 43 y.o.   MRN: [id number]  ADVANCED HEART FAILURE ROUNDING NOTE   SUBJECTIVE  43 yo female with PMH of DM1, stage V kidney disease, blindness, h/o lung CA s/p resection who was recently discharged for heart failure symptom had a high risk stress test which showed ischemia in anterior and apical region. She underwent cath 09/04/2013 which showed chronically occluded RCA and triple vessel dx. CT surgery consulted but not felt to be surgical candidate due to poor PFTs, suboptimal targets, comorbidities and shock. EF 22% by Myoview. 30-35% by echo  Underwent placement of swan and IABP for shock (co-ox 38%). Unfortunately developed cold foot and IABP had to be removed.Underwent R femoral thrombectomy and 4 compartment fasciotomy of RLE on 6/1.On 6/3 had VT/VF->PEA arrest 10 mins CPR. U/s shows R axillary DVT  Now on intermittent HD. Dobutamine down to 3.5. BP tolerating. Myoclonus improved with Keppra. Co-ox 72.  Weight up about 20 pounds from nadir. Denies dyspnea or orthopnea.    CURRENT MEDS . albumin human      . albumin human      . aspirin  81 mg Oral Daily  . atorvastatin  80 mg Oral q1800  . calcitRIOL  0.25 mcg Oral Daily  . darbepoetin (ARANESP) injection - NON-DIALYSIS  200 mcg Subcutaneous Q Wed-1800  . feeding supplement (NEPRO CARB STEADY)  237 mL Oral TID WC  . fludrocortisone  0.1 mg Oral Daily  . insulin aspart  0-15 Units Subcutaneous TID WC  . insulin aspart  0-5 Units Subcutaneous QHS  . insulin aspart  6 Units Subcutaneous TID WC  . insulin glargine  40 Units Subcutaneous Q24H  . levETIRAcetam  1,000 mg Oral BID  . lidocaine (PF)  5 mL Other Once  . midodrine  10 mg Oral TID WC  . pantoprazole  40 mg Oral Daily  . polyethylene glycol  17 g Oral Daily  . sodium chloride  10-40 mL Intracatheter Q12H  . Warfarin - Pharmacist Dosing Inpatient   Does not apply q1800    OBJECTIVE  Filed Vitals:   10/15/13 0805  10/15/13 0826 10/15/13 0830 10/15/13 0900  BP: 121/81  113/65 104/55  Pulse: 108  106 106  Temp:  98.5 F (36.9 C)    TempSrc:  Oral    Resp:    16  Height:      Weight:      SpO2:    96%    Intake/Output Summary (Last 24 hours) at 10/15/13 0920 Last data filed at 10/15/13 0900  Gross per 24 hour  Intake    391 ml  Output    903 ml  Net   -512 ml   Filed Weights   10/14/13 0500 10/15/13 0402 10/15/13 0751  Weight: 122.2 kg (269 lb 6.4 oz) 122.925 kg (271 lb) 123.4 kg (272 lb 0.8 oz)    PHYSICAL EXAM General: Lying in bed.  NAD Neuro: Alert and oriented X 3. HEENT:  Normal  Neck: Supple without bruits. Chest L subclav PC. R subclav TLC. JVP up Lungs:  clear Heart: tachy regular.  Abdomen: Soft, non-tender, non-distended, BS + x 4.  Extremities: 2+ edema bilateral ankles. Wound vac in R groin and RLE  CBC  Recent Labs  10/14/13 0412 10/15/13 0346  WBC 4.1 5.4  HGB 8.0* 7.7*  HCT 26.2* 25.8*  MCV 96.7 95.9  PLT 242 220   Basic Metabolic Panel  Recent Labs  10/14/13 0412 10/15/13 0346  NA 137 134*  K 4.4 4.6  CL 99 97  CO2 28 26  GLUCOSE 46* 160*  BUN 19 24*  CREATININE 2.07* 3.01*  CALCIUM 7.9* 7.9*  MG 2.3 2.5  PHOS 2.2* 3.0   Liver Function Tests  Recent Labs  10/13/13 0600  10/14/13 0412 10/15/13 0346  AST 20  --   --   --   ALT 15  --   --   --   ALKPHOS 138*  --   --   --   BILITOT 0.3  --   --   --   PROT 5.6*  --   --   --   ALBUMIN 2.0*  < > 2.0* 1.9*  < > = values in this interval not displayed. Cardiac Enzymes No results found for this basename: CKTOTAL, CKMB, CKMBINDEX, TROPONINI,  in the last 72 hours Thyroid Function Tests No results found for this basename: TSH, T4TOTAL, FREET3, T3FREE, THYROIDAB,  in the last 72 hours  TELE  Sinus tachycardia 100-110   Radiology/Studies  Dg Chest 2 View  09/06/2013   CLINICAL DATA:  CHF, intermittent dyspnea  EXAM: CHEST  2 VIEW  COMPARISON:  Prior chest x-ray 08/13/2013; prior  chest CT 08/22/2013  FINDINGS: Stable cardiac and mediastinal contours. Atherosclerotic calcifications are present within the transverse aorta. Moderate left and small right pleural effusions are similar compared to prior. There is persistent associated bibasilar atelectasis. Mild pulmonary vascular congestion without overt edema. No pneumothorax. No new focal airspace consolidation. No acute osseous abnormality.  IMPRESSION: 1. Stable moderate left and small right pleural effusions and associated bibasilar atelectasis. 2. Pulmonary vascular congestion without overt edema.   Electronically Signed   By: Jacqulynn Cadet M.D.   On: 09/06/2013 07:55   Ct Chest Wo Contrast  08/22/2013   CLINICAL DATA:  Left lung cancer status post lobectomy. Weight gain. Renal insufficiency.  EXAM: CT CHEST WITHOUT CONTRAST  TECHNIQUE: Multidetector CT imaging of the chest was performed following the standard protocol without IV contrast.  COMPARISON:  Radiographs 08/13/2013 and 08/04/2013.  CT 08/02/2012.  FINDINGS: There are stable postsurgical changes related to prior left lower lobe resection. There has been interval enlargement of several mediastinal lymph nodes. These include 12 mm right paratracheal (image 19), 13 mm precarinal (image 23), and 14 mm AP window (image 25) lymph nodes. Some of these nodes have retained fatty hila. Allowing for the limitations of noncontrast technique, the hila appear stable.  There is stable low-density within the right thyroid lobe. Atherosclerosis of the aorta, great vessels and coronary arteries is noted. The heart size is normal. There is no pericardial effusion.  Moderate size dependent pleural effusions are present bilaterally. There is associated dependent airspace disease in both lower lobes. In addition, there are more focal airspace opacities within the right lower lobe and inferior aspect of the left upper lobe. The latter is somewhat nodular, measuring up to 1.8 cm on image 43. No  other focal nodularity or endobronchial lesions are demonstrated.  The visualized upper abdomen has a stable appearance. There is no adrenal mass. There is increased subcutaneous edema throughout the subcutaneous fat, especially within the anterior aspect of the upper abdomen.  No worrisome osseous findings are demonstrated.  IMPRESSION: 1. As demonstrated radiographically, there are bilateral pleural effusions and bilateral airspace opacities which are new compared with the prior CT. Although there are focal somewhat nodular components in the right lower and left upper lobes, these  findings are most likely infectious/inflammatory. 2. Progressive mediastinal lymphadenopathy. Some of the nodes have retained fatty hila and may be reactive. Metastatic disease cannot be completely excluded. 3. Stable atherosclerosis and low-density within the right thyroid lobe. 4. If the airspace opacities and pleural effusions fail to respond to appropriate clinical therapy, follow-up CT or PET-CT may be warranted to exclude metastatic disease.   Electronically Signed   By: Camie Patience M.D.   On: 08/22/2013 10:26   Dg Chest Portable 1 View  08/13/2013   CLINICAL DATA:  Shortness of breath increasing for 2 weeks. History of CHF.  EXAM: PORTABLE CHEST - 1 VIEW  COMPARISON:  08/04/2013  FINDINGS: Again noted are bibasilar lung densities that are suggestive for pleural effusions and atelectasis. Heart size appears to be enlarged. There is some peribronchial thickening and cannot exclude pulmonary edema.  IMPRESSION: Bilateral pleural effusions with basilar atelectasis. There is mild pulmonary edema. Minimal change from the previous examination.   Electronically Signed   By: Markus Daft M.D.   On: 08/13/2013 23:10    ASSESSMENT AND PLAN 1. Cardiogenic shock 2. Acute on chronic systolic HF     EF ~49-44% 3. 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 Acute respiratory failure 5. 3-V CAD, severe.  6. Carotid stenosis     - doppler 08/31/2013 shows R  60-79% ICA stenosis, L 80-99% ICA stenosis     - vascular surgery aware 7. 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 DM1 9. Ischemic R foot s/p R femoral thrombectomy and 4-compartment fasciotomy.  10. VT/VF arrest 6/3 11. R axillary DVT 12. Severe protein calorie malnutrition  13. Large right groin wound s/p debridement 6/12 14. Anemia: Transfuse hemoglobin < 7.  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 Tolerating intermittent HD. Will try to wean dobutamine to 2.5 today. She has about 15-20 pounds of volume on board but I am ok with her having some residual fluid if it helps her maintain her BP.   Myoclonus improved on Keppra but still present. Increase to 1000 bid.   Wound Care as per [facility].  Continue medical management of CAD. Long-term management of HF is problematic as she cannot tolerate any HF meds.   If she can tolerate intermittent HD; may be candidate for [facility] soon.   Kolson Chovanec,MD 9:20 AM

## 2013-10-15 NOTE — Procedures (Signed)
Patient was seen on dialysis and the procedure was supervised. BFR 350 Via RIJ TDC BP is 107/66.  Patient appears to be tolerating treatment well.  She is still on dobutamine gtt but is off of levophed gtt.  Weaning dobutamine per Dr. Haroldine Laws.

## 2013-10-15 NOTE — Progress Notes (Signed)
  [facility] Progress Note  10/15/2013 9:19 AM 17 Days Post-Op  Subjective:  Patient complaining of minor leg pain with vac placement. Otherwise no other complaints.     Filed Vitals:   10/15/13 0900  BP: 104/55  Pulse: 106  Temp:   Resp: 16    Physical Exam: Incisions:  Wound vac seal intact to right groin and right fasciotomy sites.  Extremities:  Feet warm and pink  CBC    Component Value Date/Time   WBC 5.4 10/15/2013 0346   RBC 2.69* 10/15/2013 0346   HGB 7.7* 10/15/2013 0346   HCT 25.8* 10/15/2013 0346   PLT 264 10/15/2013 0346   MCV 95.9 10/15/2013 0346   MCH 28.6 10/15/2013 0346   MCHC 29.8* 10/15/2013 0346   RDW 17.9* 10/15/2013 0346   LYMPHSABS 1.4 08/04/2013 1223   MONOABS 0.4 08/04/2013 1223   EOSABS 0.2 08/04/2013 1223   BASOSABS 0.1 08/04/2013 1223    BMET    Component Value Date/Time   NA 134* 10/15/2013 0346   K 4.6 10/15/2013 0346   CL 97 10/15/2013 0346   CO2 26 10/15/2013 0346   GLUCOSE 160* 10/15/2013 0346   BUN 24* 10/15/2013 0346   CREATININE 3.01* 10/15/2013 0346   CREATININE 4.17* 07/06/2013 1618   CALCIUM 7.9* 10/15/2013 0346   GFRNONAA 18* 10/15/2013 0346   GFRNONAA 71 07/13/2011 0835   GFRAA 20* 10/15/2013 0346   GFRAA 82 07/13/2011 0835    INR    Component Value Date/Time   INR 3.75* 10/15/2013 0346     Intake/Output Summary (Last 24 hours) at 10/15/13 0919 Last data filed at 10/15/13 0900  Gross per 24 hour  Intake    391 ml  Output    903 ml  Net   -512 ml     Assessment:  43 y.o. female is s/p:  Right femoral endarterectomy, right femoral thrombectomy, four compartment fasciotomy  28 Days Post-Op   Closure of right leg fasciotomy  26 Days Post-Op   I&D right groin with placement of wound vac.  17 Days Post-Op    Plan: Continue wound vac to right groin and right leg fasciotomies.    Virgina Jock, PA-C [facility] Office: [phone] Pager: [phone] 10/15/2013 9:19  AM

## 2013-10-16 LAB — RENAL FUNCTION PANEL
Albumin: 2.4 g/dL — ABNORMAL LOW (ref 3.5–5.2)
BUN: 27 mg/dL — AB (ref 6–23)
CALCIUM: 8.3 mg/dL — AB (ref 8.4–10.5)
CO2: 28 mEq/L (ref 19–32)
CREATININE: 3.65 mg/dL — AB (ref 0.50–1.10)
Chloride: 96 mEq/L (ref 96–112)
GFR calc Af Amer: 16 mL/min — ABNORMAL LOW (ref >90)
GFR calc non Af Amer: 14 mL/min — ABNORMAL LOW (ref >90)
Glucose, Bld: 142 mg/dL — ABNORMAL HIGH (ref 70–99)
PHOSPHORUS: 4.2 mg/dL (ref 2.3–4.6)
Potassium: 4.4 mEq/L (ref 3.7–5.3)
Sodium: 136 mEq/L — ABNORMAL LOW (ref 137–147)

## 2013-10-16 LAB — GLUCOSE, CAPILLARY
GLUCOSE-CAPILLARY: 137 mg/dL — AB (ref 70–99)
Glucose-Capillary: 122 mg/dL — ABNORMAL HIGH (ref 70–99)
Glucose-Capillary: 78 mg/dL (ref 70–99)

## 2013-10-16 LAB — CBC
HEMATOCRIT: 27.4 % — AB (ref 36.0–46.0)
Hemoglobin: 8.2 g/dL — ABNORMAL LOW (ref 12.0–15.0)
MCH: 29 pg (ref 26.0–34.0)
MCHC: 29.9 g/dL — ABNORMAL LOW (ref 30.0–36.0)
MCV: 96.8 fL (ref 78.0–100.0)
PLATELETS: 286 10*3/uL (ref 150–400)
RBC: 2.83 MIL/uL — AB (ref 3.87–5.11)
RDW: 17.7 % — ABNORMAL HIGH (ref 11.5–15.5)
WBC: 5.6 10*3/uL (ref 4.0–10.5)

## 2013-10-16 LAB — PROTIME-INR
INR: 2.31 — AB (ref 0.00–1.49)
Prothrombin Time: 25.4 seconds — ABNORMAL HIGH (ref 11.6–15.2)

## 2013-10-16 LAB — MAGNESIUM: MAGNESIUM: 2.2 mg/dL (ref 1.5–2.5)

## 2013-10-16 LAB — CARBOXYHEMOGLOBIN
CARBOXYHEMOGLOBIN: 1.9 % — AB (ref 0.5–1.5)
Methemoglobin: 0.8 % (ref 0.0–1.5)
O2 Saturation: 49.5 %
Total hemoglobin: 8.1 g/dL — ABNORMAL LOW (ref 12.0–16.0)

## 2013-10-16 MED ORDER — INSULIN GLARGINE 100 UNIT/ML ~~LOC~~ SOLN
40.0000 [IU] | SUBCUTANEOUS | Status: DC
  Filled 2013-10-16 (×2): qty 0.4

## 2013-10-16 MED ORDER — WARFARIN SODIUM 7.5 MG PO TABS
7.5000 mg | ORAL_TABLET | Freq: Once | ORAL | Status: AC
  Administered 2013-10-16: 7.5 mg via ORAL
  Filled 2013-10-16: qty 1

## 2013-10-16 NOTE — Progress Notes (Signed)
ANTICOAGULATION CONSULT NOTE - Follow Up Consult  Pharmacy Consult for Coumadin Indication: Thromboembolic state (RUE DVT)  Allergies  Allergen Reactions  . Crestor [Rosuvastatin] Other (See Comments)    Severe muscle weakness  . Nsaids Other (See Comments)    Not allergic, "bad on my kidneys"  . 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 Ciprofloxacin Rash    Patient Measurements: Height: 5' 7.5" (171.5 cm) Weight: 271 lb 12.8 oz (123.288 kg) IBW/kg (Calculated) : 62.75  Vital Signs: Temp: 98 F (36.7 C) (06/30 1212) Temp src: Oral (06/30 1212) BP: 122/90 mmHg (06/30 1000) Pulse Rate: 109 (06/30 1000)  Labs:  Recent Labs  10/14/13 0412 10/15/13 0346 10/16/13 0455  HGB 8.0* 7.7* 8.2*  HCT 26.2* 25.8* 27.4*  PLT 242 264 286  LABPROT 38.7* 37.1* 25.4*  INR 3.96* 3.75* 2.31*  CREATININE 2.07* 3.01* 3.65*    Estimated Creatinine Clearance: 26.7 ml/min (by C-G formula based on Cr of 3.65).  Assessment: 43yof continues on coumadin for RUE DVT. She has completed 5 days of overlap therapy with argatroban. Argatroban was stopped 6/25 due to therapeutic INR. INR (2.31) has now fallen back in the therapeutic range after holding a few days of Coumadin. - H/H and Plts fairly stable - No significant bleeding reported  Goal of Therapy: INR 2-3 Monitor platelets by anticoagulation protocol: Yes   Plan:  1. Coumadin 7.5 mg po x 1 tonight.  Likely doesn't need this much chronically but will try to keep INR from dropping below 2. 2. Follow-up AM INR 3. Coumadin education eventually once discharge plan more clear.   Uvaldo Rising, BCPS  Clinical Pharmacist Pager [phone]  10/16/2013 1:43 PM

## 2013-10-16 NOTE — Progress Notes (Signed)
Patient ID: VIRGINIE JOSTEN, female   DOB: [birth year], 43 y.o.   MRN: [id number] S:feels better O:BP 104/68  Pulse 106  Temp(Src) 98.6 F (37 C) (Oral)  Resp 11  Ht 5' 7.5" (1.715 m)  Wt 123.288 kg (271 lb 12.8 oz)  BMI 41.92 kg/m2  SpO2 100%  LMP 08/19/2013  Intake/Output Summary (Last 24 hours) at 10/16/13 0917 Last data filed at 10/16/13 0800  Gross per 24 hour  Intake  942.5 ml  Output   1765 ml  Net -822.5 ml   Intake/Output: I/O last 3 completed shifts: In: 1269.6 [P.O.:1080; I.V.:189.6] Out: 1815 [Drains:150; MOQHU:7654]  Intake/Output this shift:  Total I/O In: 4.4 [I.V.:4.4] Out: -  Weight change: 0.475 kg (1 lb 0.8 oz) Gen:WD obese WF in NAd YTK:PTWSF, no rub Resp:cta KCL:EXNTZG Ext:+edema   Recent Labs Lab 10/11/13 1600 10/12/13 0400 10/13/13 0325 10/13/13 0600 10/13/13 1515 10/14/13 0412 10/15/13 0346 10/16/13 0455  NA 135* 135* 134*  --  136* 137 134* 136*  K 5.2 4.6 4.5  --  4.5 4.4 4.6 4.4  CL 98 98 97  --  98 99 97 96  CO2 26 26 25   --  26 28 26 28   GLUCOSE 178* 112* 137*  --  65* 46* 160* 142*  BUN 23 21 29*  --  24* 19 24* 27*  CREATININE 2.29* 1.93* 2.45*  --  2.22* 2.07* 3.01* 3.65*  ALBUMIN 2.0* 1.9* 2.0* 2.0* 2.0* 2.0* 1.9* 2.4*  CALCIUM 7.8* 8.1* 7.8*  --  8.1* 7.9* 7.9* 8.3*  PHOS 2.4 1.8* 3.0  --  2.1* 2.2* 3.0 4.2  AST  --   --   --  20  --   --   --   --   ALT  --   --   --  15  --   --   --   --    Liver Function Tests:  Recent Labs Lab 10/13/13 0600  10/14/13 0412 10/15/13 0346 10/16/13 0455  AST 20  --   --   --   --   ALT 15  --   --   --   --   ALKPHOS 138*  --   --   --   --   BILITOT 0.3  --   --   --   --   PROT 5.6*  --   --   --   --   ALBUMIN 2.0*  < > 2.0* 1.9* 2.4*  < > = values in this interval not displayed. No results found for this basename: LIPASE, AMYLASE,  in the last 168 hours No results found for this basename: AMMONIA,  in the last 168 hours CBC:  Recent Labs Lab 10/12/13 0400  10/13/13 0325 10/14/13 0412 10/15/13 0346 10/16/13 0455  WBC 5.4 7.7 4.1 5.4 5.6  HGB 7.5* 8.0* 8.0* 7.7* 8.2*  HCT 24.7* 26.4* 26.2* 25.8* 27.4*  MCV 95.4 95.3 96.7 95.9 96.8  PLT 253 271 242 264 286   Cardiac Enzymes: No results found for this basename: CKTOTAL, CKMB, CKMBINDEX, TROPONINI,  in the last 168 hours CBG:  Recent Labs Lab 10/15/13 0824 10/15/13 1212 10/15/13 1652 10/15/13 2137 10/16/13 0750  GLUCAP 79 90 106* 166* 122*    Iron Studies: No results found for this basename: IRON, TIBC, TRANSFERRIN, FERRITIN,  in the last 72 hours Studies/Results: No results found. Marland Kitchen aspirin  81 mg Oral Daily  . atorvastatin  80 mg Oral q1800  .  calcitRIOL  0.25 mcg Oral Daily  . darbepoetin (ARANESP) injection - NON-DIALYSIS  200 mcg Subcutaneous Q Wed-1800  . feeding supplement (NEPRO CARB STEADY)  237 mL Oral TID WC  . fludrocortisone  0.1 mg Oral Daily  . insulin aspart  0-15 Units Subcutaneous TID WC  . insulin aspart  0-5 Units Subcutaneous QHS  . insulin aspart  6 Units Subcutaneous TID WC  . [START ON 10/17/2013] insulin glargine  40 Units Subcutaneous Q24H  . levETIRAcetam  1,000 mg Oral BID  . lidocaine (PF)  5 mL Other Once  . midodrine  10 mg Oral TID WC  . pantoprazole  40 mg Oral Daily  . polyethylene glycol  17 g Oral Daily  . sodium chloride  10-40 mL Intracatheter Q12H  . Warfarin - Pharmacist Dosing Inpatient   Does not apply q1800    BMET    Component Value Date/Time   NA 136* 10/16/2013 0455   K 4.4 10/16/2013 0455   CL 96 10/16/2013 0455   CO2 28 10/16/2013 0455   GLUCOSE 142* 10/16/2013 0455   BUN 27* 10/16/2013 0455   CREATININE 3.65* 10/16/2013 0455   CREATININE 4.17* 07/06/2013 1618   CALCIUM 8.3* 10/16/2013 0455   GFRNONAA 14* 10/16/2013 0455   GFRNONAA 71 07/13/2011 0835   GFRAA 16* 10/16/2013 0455   GFRAA 82 07/13/2011 0835   CBC    Component Value Date/Time   WBC 5.6 10/16/2013 0455   RBC 2.83* 10/16/2013 0455   HGB 8.2* 10/16/2013 0455   HCT  27.4* 10/16/2013 0455   PLT 286 10/16/2013 0455   MCV 96.8 10/16/2013 0455   MCH 29.0 10/16/2013 0455   MCHC 29.9* 10/16/2013 0455   RDW 17.7* 10/16/2013 0455   LYMPHSABS 1.4 08/04/2013 1223   MONOABS 0.4 08/04/2013 1223   EOSABS 0.2 08/04/2013 1223   BASOSABS 0.1 08/04/2013 1223   Assessment/Plan:  1. ESRD- (dialysis dependent since 5/21 and off CVVHD 6/28 and first IHD 6/29) tolerated IHD well yesterday.  Will plan again tomorrow. 2. 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   3. Anemia of chronic disease- on aranesp 260mcg weekly 4. HIT- on argatroban.  Should repeat panel 5. Right axillary DVT 6. Bilateral carotid stenosis 7. DM- on insulin 8. S/p right femoral endarterectomy with wound deehiscence and now on vac (episode of vascular insufficiency with cold foot after IABP) 9. 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 VT/VF then PEA arrest 6/3.  Stable since.  [place] A

## 2013-10-16 NOTE — Progress Notes (Signed)
Patient ID: Hannah [name], female   DOB: 07/06/1970, 43 y.o.   MRN: [id number]  ADVANCED HEART FAILURE ROUNDING NOTE   SUBJECTIVE  43 yo female with PMH of DM1, stage V kidney disease, blindness, h/o lung CA s/p resection who was recently discharged for heart failure symptom had a high risk stress test which showed ischemia in anterior and apical region. She underwent cath 09/04/2013 which showed chronically occluded RCA and triple vessel dx. CT surgery consulted but not felt to be surgical candidate due to poor PFTs, suboptimal targets, comorbidities and shock. EF 22% by Myoview. 30-35% by echo  Underwent placement of swan and IABP for shock (co-ox 38%). Unfortunately developed cold foot and IABP had to be removed.Underwent R femoral thrombectomy and 4 compartment fasciotomy of RLE on 6/1.On 6/3 had VT/VF->PEA arrest 10 mins CPR. U/s shows R axillary DVT  Now on intermittent HD. Yesterday Dobutamine weaned down to 2.5 however this am CO-OX down to 49%.Weight up about 20 pounds from nadir. Denies dyspnea or orthopnea.    CURRENT MEDS . aspirin  81 mg Oral Daily  . atorvastatin  80 mg Oral q1800  . calcitRIOL  0.25 mcg Oral Daily  . darbepoetin (ARANESP) injection - NON-DIALYSIS  200 mcg Subcutaneous Q Wed-1800  . feeding supplement (NEPRO CARB STEADY)  237 mL Oral TID WC  . fludrocortisone  0.1 mg Oral Daily  . insulin aspart  0-15 Units Subcutaneous TID WC  . insulin aspart  0-5 Units Subcutaneous QHS  . insulin aspart  6 Units Subcutaneous TID WC  . insulin glargine  40 Units Subcutaneous Q24H  . levETIRAcetam  1,000 mg Oral BID  . lidocaine (PF)  5 mL Other Once  . midodrine  10 mg Oral TID WC  . pantoprazole  40 mg Oral Daily  . polyethylene glycol  17 g Oral Daily  . sodium chloride  10-40 mL Intracatheter Q12H  . Warfarin - Pharmacist Dosing Inpatient   Does not apply q1800    OBJECTIVE  Filed Vitals:   10/16/13 0500 10/16/13 0600 10/16/13 0700 10/16/13 0800  BP: 107/69  103/89 108/61   Pulse: 106 104 104   Temp:    98.6 F (37 C)  TempSrc:    Oral  Resp: 16 20 11    Height:      Weight:      SpO2: 100% 100% 100%     Intake/Output Summary (Last 24 hours) at 10/16/13 0832 Last data filed at 10/16/13 0700  Gross per 24 hour  Intake 1184.2 ml  Output   1765 ml  Net -580.8 ml   Filed Weights   10/15/13 0751 10/15/13 1200 10/16/13 0118  Weight: 272 lb 0.8 oz (123.4 kg) 268 lb 4.8 oz (121.7 kg) 271 lb 12.8 oz (123.288 kg)    PHYSICAL EXAM General: Lying in bed.  NAD Neuro: Alert and oriented X 3. HEENT:  Normal  Neck: Supple without bruits. Chest L subclav PC. R subclav TLC. JVP up Lungs:  clear Heart: tachy regular.  Abdomen: Soft, non-tender, non-distended, BS + x 4.  Extremities: 2+ edema bilateral ankles. Wound vac in R groin and RLE  CBC  Recent Labs  10/15/13 0346 10/16/13 0455  WBC 5.4 5.6  HGB 7.7* 8.2*  HCT 25.8* 27.4*  MCV 95.9 96.8  PLT 264 638   Basic Metabolic Panel  Recent Labs  10/15/13 0346 10/16/13 0455  NA 134* 136*  K 4.6 4.4  CL 97 96  CO2 26 28  GLUCOSE 160* 142*  BUN 24* 27*  CREATININE 3.01* 3.65*  CALCIUM 7.9* 8.3*  MG 2.5 2.2  PHOS 3.0 4.2   Liver Function Tests  Recent Labs  10/15/13 0346 10/16/13 0455  ALBUMIN 1.9* 2.4*   Cardiac Enzymes No results found for this basename: CKTOTAL, CKMB, CKMBINDEX, TROPONINI,  in the last 72 hours Thyroid Function Tests No results found for this basename: TSH, T4TOTAL, FREET3, T3FREE, THYROIDAB,  in the last 72 hours  TELE  Sinus tachycardia 100-110   Radiology/Studies  Dg Chest 2 View  09/06/2013   CLINICAL DATA:  CHF, intermittent dyspnea  EXAM: CHEST  2 VIEW  COMPARISON:  Prior chest x-ray 08/13/2013; prior chest CT 08/22/2013  FINDINGS: Stable cardiac and mediastinal contours. Atherosclerotic calcifications are present within the transverse aorta. Moderate left and small right pleural effusions are similar compared to prior. There is persistent  associated bibasilar atelectasis. Mild pulmonary vascular congestion without overt edema. No pneumothorax. No new focal airspace consolidation. No acute osseous abnormality.  IMPRESSION: 1. Stable moderate left and small right pleural effusions and associated bibasilar atelectasis. 2. Pulmonary vascular congestion without overt edema.   Electronically Signed   By: Jacqulynn Cadet M.D.   On: 09/06/2013 07:55   Ct Chest Wo Contrast  08/22/2013   CLINICAL DATA:  Left lung cancer status post lobectomy. Weight gain. Renal insufficiency.  EXAM: CT CHEST WITHOUT CONTRAST  TECHNIQUE: Multidetector CT imaging of the chest was performed following the standard protocol without IV contrast.  COMPARISON:  Radiographs 08/13/2013 and 08/04/2013.  CT 08/02/2012.  FINDINGS: There are stable postsurgical changes related to prior left lower lobe resection. There has been interval enlargement of several mediastinal lymph nodes. These include 12 mm right paratracheal (image 19), 13 mm precarinal (image 23), and 14 mm AP window (image 25) lymph nodes. Some of these nodes have retained fatty hila. Allowing for the limitations of noncontrast technique, the hila appear stable.  There is stable low-density within the right thyroid lobe. Atherosclerosis of the aorta, great vessels and coronary arteries is noted. The heart size is normal. There is no pericardial effusion.  Moderate size dependent pleural effusions are present bilaterally. There is associated dependent airspace disease in both lower lobes. In addition, there are more focal airspace opacities within the right lower lobe and inferior aspect of the left upper lobe. The latter is somewhat nodular, measuring up to 1.8 cm on image 43. No other focal nodularity or endobronchial lesions are demonstrated.  The visualized upper abdomen has a stable appearance. There is no adrenal mass. There is increased subcutaneous edema throughout the subcutaneous fat, especially within the  anterior aspect of the upper abdomen.  No worrisome osseous findings are demonstrated.  IMPRESSION: 1. As demonstrated radiographically, there are bilateral pleural effusions and bilateral airspace opacities which are new compared with the prior CT. Although there are focal somewhat nodular components in the right lower and left upper lobes, these findings are most likely infectious/inflammatory. 2. Progressive mediastinal lymphadenopathy. Some of the nodes have retained fatty hila and may be reactive. Metastatic disease cannot be completely excluded. 3. Stable atherosclerosis and low-density within the right thyroid lobe. 4. If the airspace opacities and pleural effusions fail to respond to appropriate clinical therapy, follow-up CT or PET-CT may be warranted to exclude metastatic disease.   Electronically Signed   By: Camie Patience M.D.   On: 08/22/2013 10:26   Dg Chest Portable 1 View  08/13/2013   CLINICAL DATA:  Shortness of breath  increasing for 2 weeks. History of CHF.  EXAM: PORTABLE CHEST - 1 VIEW  COMPARISON:  08/04/2013  FINDINGS: Again noted are bibasilar lung densities that are suggestive for pleural effusions and atelectasis. Heart size appears to be enlarged. There is some peribronchial thickening and cannot exclude pulmonary edema.  IMPRESSION: Bilateral pleural effusions with basilar atelectasis. There is mild pulmonary edema. Minimal change from the previous examination.   Electronically Signed   By: Markus Daft M.D.   On: 08/13/2013 23:10    ASSESSMENT AND PLAN 1. Cardiogenic shock 2. Acute on chronic systolic HF     EF ~03-52% 3. 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 Acute respiratory failure 5. 3-V CAD, severe.  6. Carotid stenosis     - doppler 08/31/2013 shows R 60-79% ICA stenosis, L 80-99% ICA stenosis     - vascular surgery aware 7. 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 DM1 9. Ischemic R  foot s/p R femoral thrombectomy and 4-compartment fasciotomy.  10. VT/VF arrest 6/3 11. R axillary DVT 12. Severe protein calorie malnutrition  13. Large right groin wound s/p debridement 6/12 14. Anemia: Transfuse hemoglobin < 7.  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 Tolerating intermittent HD. Attempted to wean  dobutamine to 2.5 however today CO-OX down to 49%. Increase dobutamine back to 3 mcg. Will follow and continue to try and wean.   She is starting to gain some fluid back. Would keep even to slightly negative with HD. I discussed with Dr. Marval Regal.   Wound care per [facility].  PT to see.   Continue to manage HF and CAD medically.   Daniel Bensimhon,MD 1:10 PM

## 2013-10-16 NOTE — Progress Notes (Signed)
Physical Therapy Treatment Patient Details Name: Hannah [name] MRN: [id number] DOB: 07/06/1970 Today's Date: 10/16/2013    History of Present Illness 43 yo WF with  DM, advanced CKD 5 at baseline.  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 Developed cardiogenic shock, cold foot after IABP requiring femoral thrombectomy and fasciotomy (6/1), VT/VF then PEA arrest 6/3. She required initiation of RRT 09/06/13 and has remained dialysis dependent.  Short trial of IHD unsuccessful and back to CRRT as of 6/4 with  inotrope requirement and inability to wean. Pt transitioned to intermittent HD as of 6/29.Marland Kitchen    PT Comments    Pt with excellent progress. Able to amb in hallway. If husband/children can provide 24 hour assist at home, feel pt can likely return home from PT standpoint when medical issues allow.  Follow Up Recommendations  Home health PT;Supervision/Assistance - 24 hour     Equipment Recommendations  Rolling walker with 5" wheels    Recommendations for Other Services       Precautions / Restrictions Precautions Precautions: Fall Precaution Comments: Has had myoclonic jerks with buckling resulting in fall. Currently no myoclonic jerks.    Mobility  Bed Mobility Overal bed mobility: Needs Assistance Bed Mobility: Supine to Sit     Supine to sit: Supervision     General bed mobility comments: for lines/tubes  Transfers Overall transfer level: Needs assistance Equipment used: Rolling walker (2 wheeled) Transfers: Sit to/from Stand Sit to Stand: Min assist;+2 safety/equipment Stand pivot transfers: Min assist;+2 safety/equipment       General transfer comment: Verbal cues for hand placement. Used Stedy to perform pivot to chair.  Ambulation/Gait Ambulation/Gait assistance: Min assist;+2 safety/equipment Ambulation Distance (Feet): 130 Feet Assistive device: Rolling walker (2 wheeled) Gait Pattern/deviations:  Step-through pattern;Decreased step length - right;Decreased step length - left Gait velocity: decr Gait velocity interpretation: Below normal speed for age/gender General Gait Details: Followed pt closely with chair due to history of buckling due to myoclonic jerks.   Stairs            Wheelchair Mobility    Modified Rankin (Stroke Patients Only)       Balance Overall balance assessment: Needs assistance Sitting-balance support: No upper extremity supported Sitting balance-Leahy Scale: Good     Standing balance support: Bilateral upper extremity supported Standing balance-Leahy Scale: Poor Standing balance comment: Stands with walker with min guard assist.                    Cognition Arousal/Alertness: Awake/alert Behavior During Therapy: WFL for tasks assessed/performed Overall Cognitive Status: Within Functional Limits for tasks assessed                      Exercises      General Comments        Pertinent Vitals/Pain VSS    Home Living Family/patient expects to be discharged to:: Private residence Living Arrangements: Spouse/significant other;Children Available Help at Discharge: Family Type of Home: House Home Access: Stairs to enter Entrance Stairs-Rails: Right;Left Home Layout: One level Home Equipment: None      Prior Function            PT Goals (current goals can now be found in the care plan section) Acute Rehab PT Goals PT Goal Formulation: With patient Time For Goal Achievement: 10/23/13 Potential to Achieve Goals: Good Progress towards PT goals: Goals met and  updated - see care plan    Frequency  Min 3X/week    PT Plan Discharge plan needs to be updated;Frequency needs to be updated    Co-evaluation             End of Session Equipment Utilized During Treatment: Gait belt Activity Tolerance: Patient tolerated treatment well Patient left: in chair;with call bell/phone within reach     Time:  0922-0954 PT Time Calculation (min): 32 min  Charges:  $Gait Training: 23-37 mins                    G Codes:      MAYCOCK,CARY October 30, 2013, 10:22 AM  Suanne Marker PT [phone]

## 2013-10-16 NOTE — Progress Notes (Signed)
1552 CBG 68 Orange Juice given to patient to drink. Asymptomatic  1610 CBG 59 Orange Juice given to patient to drink. Asymptomatic  1625 CBG 85

## 2013-10-17 LAB — CBC
HEMATOCRIT: 25.1 % — AB (ref 36.0–46.0)
HEMOGLOBIN: 7.7 g/dL — AB (ref 12.0–15.0)
MCH: 28.6 pg (ref 26.0–34.0)
MCHC: 30.7 g/dL (ref 30.0–36.0)
MCV: 93.3 fL (ref 78.0–100.0)
Platelets: 297 10*3/uL (ref 150–400)
RBC: 2.69 MIL/uL — ABNORMAL LOW (ref 3.87–5.11)
RDW: 17.5 % — AB (ref 11.5–15.5)
WBC: 5.8 10*3/uL (ref 4.0–10.5)

## 2013-10-17 LAB — HEPARIN INDUCED THROMBOCYTOPENIA PNL
Heparin Induced Plt Ab: NEGATIVE
PATIENT O. D.: 0.05
UFH HIGH DOSE UFH H: 0 %
UFH LOW DOSE 0.5 IU/ML: 0 %
UFH Low Dose 0.1 IU/mL: 0 % Release
UFH SRA RESULT: NEGATIVE

## 2013-10-17 LAB — RENAL FUNCTION PANEL
ALBUMIN: 2.3 g/dL — AB (ref 3.5–5.2)
BUN: 40 mg/dL — ABNORMAL HIGH (ref 6–23)
CO2: 27 mEq/L (ref 19–32)
CREATININE: 5.07 mg/dL — AB (ref 0.50–1.10)
Calcium: 8.4 mg/dL (ref 8.4–10.5)
Chloride: 92 mEq/L — ABNORMAL LOW (ref 96–112)
GFR calc Af Amer: 11 mL/min — ABNORMAL LOW (ref >90)
GFR calc non Af Amer: 9 mL/min — ABNORMAL LOW (ref >90)
Glucose, Bld: 114 mg/dL — ABNORMAL HIGH (ref 70–99)
Phosphorus: 5.2 mg/dL — ABNORMAL HIGH (ref 2.3–4.6)
Potassium: 4.6 mEq/L (ref 3.7–5.3)
Sodium: 131 mEq/L — ABNORMAL LOW (ref 137–147)

## 2013-10-17 LAB — PROTIME-INR
INR: 1.9 — ABNORMAL HIGH (ref 0.00–1.49)
PROTHROMBIN TIME: 21.8 s — AB (ref 11.6–15.2)

## 2013-10-17 LAB — GLUCOSE, CAPILLARY
GLUCOSE-CAPILLARY: 59 mg/dL — AB (ref 70–99)
GLUCOSE-CAPILLARY: 85 mg/dL (ref 70–99)
GLUCOSE-CAPILLARY: 118 mg/dL — AB (ref 70–99)
Glucose-Capillary: 68 mg/dL — ABNORMAL LOW (ref 70–99)
Glucose-Capillary: 117 mg/dL — ABNORMAL HIGH (ref 70–99)
Glucose-Capillary: 172 mg/dL — ABNORMAL HIGH (ref 70–99)
Glucose-Capillary: 240 mg/dL — ABNORMAL HIGH (ref 70–99)

## 2013-10-17 LAB — CARBOXYHEMOGLOBIN
CARBOXYHEMOGLOBIN: 2.3 % — AB (ref 0.5–1.5)
Methemoglobin: 0.8 % (ref 0.0–1.5)
O2 SAT: 62.5 %
Total hemoglobin: 8 g/dL — ABNORMAL LOW (ref 12.0–16.0)

## 2013-10-17 LAB — MAGNESIUM: Magnesium: 2.2 mg/dL (ref 1.5–2.5)

## 2013-10-17 MED ORDER — INSULIN ASPART 100 UNIT/ML ~~LOC~~ SOLN
4.0000 [IU] | Freq: Three times a day (TID) | SUBCUTANEOUS | Status: DC
  Administered 2013-10-17 – 2013-10-28 (×27): 4 [IU] via SUBCUTANEOUS

## 2013-10-17 MED ORDER — INSULIN GLARGINE 100 UNIT/ML ~~LOC~~ SOLN
35.0000 [IU] | SUBCUTANEOUS | Status: DC
  Administered 2013-10-17 – 2013-10-20 (×4): 35 [IU] via SUBCUTANEOUS
  Filled 2013-10-17 (×5): qty 0.35

## 2013-10-17 MED ORDER — WARFARIN SODIUM 10 MG PO TABS
10.0000 mg | ORAL_TABLET | Freq: Once | ORAL | Status: AC
  Administered 2013-10-17: 10 mg via ORAL
  Filled 2013-10-17: qty 1

## 2013-10-17 NOTE — Progress Notes (Signed)
PT Cancellation Note  Patient Details Name: Hannah [name] MRN: [id number] DOB: 1970/09/01   Cancelled Treatment:    Reason Eval/Treat Not Completed: Patient at procedure or test/unavailable (Pt receiving HD.)   [facility] 10/17/2013, 10:18 AM

## 2013-10-17 NOTE — Progress Notes (Signed)
Patient ID: Hannah [name], female   DOB: September 30, 1970, 43 y.o.   MRN: [id number]  ADVANCED HEART FAILURE ROUNDING NOTE   SUBJECTIVE  43 yo female with PMH of DM1, stage V kidney disease, blindness, h/o lung CA s/p resection who was recently discharged for heart failure symptom had a high risk stress test which showed ischemia in anterior and apical region. She underwent cath 09/04/2013 which showed chronically occluded RCA and triple vessel dx. CT surgery consulted but not felt to be surgical candidate due to poor PFTs, suboptimal targets, comorbidities and shock. EF 22% by Myoview. 30-35% by echo  Underwent placement of swan and IABP for shock (co-ox 38%). Unfortunately developed cold foot and IABP had to be removed.Underwent R femoral thrombectomy and 4 compartment fasciotomy of RLE on 6/1.On 6/3 had VT/VF->PEA arrest 10 mins CPR. U/s shows R axillary DVT  Now on intermittent HD. Dobutamine weaned down to 2.5 however CO-OX down. So dobutamine increased to 3. Co ox back to 62%.Weight up another 3 pounds now up about 25 pounds from nadir. Denies dyspnea or orthopnea.    CURRENT MEDS . aspirin  81 mg Oral Daily  . atorvastatin  80 mg Oral q1800  . calcitRIOL  0.25 mcg Oral Daily  . darbepoetin (ARANESP) injection - NON-DIALYSIS  200 mcg Subcutaneous Q Wed-1800  . feeding supplement (NEPRO CARB STEADY)  237 mL Oral TID WC  . fludrocortisone  0.1 mg Oral Daily  . insulin aspart  0-15 Units Subcutaneous TID WC  . insulin aspart  0-5 Units Subcutaneous QHS  . insulin aspart  6 Units Subcutaneous TID WC  . insulin glargine  40 Units Subcutaneous Q24H  . levETIRAcetam  1,000 mg Oral BID  . lidocaine (PF)  5 mL Other Once  . midodrine  10 mg Oral TID WC  . pantoprazole  40 mg Oral Daily  . polyethylene glycol  17 g Oral Daily  . sodium chloride  10-40 mL Intracatheter Q12H  . Warfarin - Pharmacist Dosing Inpatient   Does not apply q1800    OBJECTIVE  Filed Vitals:   10/17/13 0744 10/17/13  0756 10/17/13 0800 10/17/13 0815  BP: 118/71  125/77 122/68  Pulse: 102  102   Temp:  97.7 F (36.5 C)    TempSrc:  Oral    Resp: 15  14   Height:      Weight:      SpO2: 99%  99%     Intake/Output Summary (Last 24 hours) at 10/17/13 0818 Last data filed at 10/17/13 0800  Gross per 24 hour  Intake  734.8 ml  Output    125 ml  Net  609.8 ml   Filed Weights   10/16/13 0118 10/17/13 0500 10/17/13 0710  Weight: 123.288 kg (271 lb 12.8 oz) 124 kg (273 lb 5.9 oz) 125.6 kg (276 lb 14.4 oz)    PHYSICAL EXAM General: Lying in bed.  NAD Neuro: Alert and oriented X 3. HEENT:  Normal  Neck: Supple without bruits. Chest L subclav PC. R subclav TLC. JVP up Lungs:  clear Heart: tachy regular.  Abdomen: Soft, non-tender, non-distended, BS + x 4.  Extremities: 2+ edema bilateral ankles. Wound vac in R groin and RLE  CBC  Recent Labs  10/16/13 0455 10/17/13 0425  WBC 5.6 5.8  HGB 8.2* 7.7*  HCT 27.4* 25.1*  MCV 96.8 93.3  PLT 286 810   Basic Metabolic Panel  Recent Labs  10/16/13 0455 10/17/13 0425  NA 136* 131*  K 4.4 4.6  CL 96 92*  CO2 28 27  GLUCOSE 142* 114*  BUN 27* 40*  CREATININE 3.65* 5.07*  CALCIUM 8.3* 8.4  MG 2.2 2.2  PHOS 4.2 5.2*   Liver Function Tests  Recent Labs  10/16/13 0455 10/17/13 0425  ALBUMIN 2.4* 2.3*   Cardiac Enzymes No results found for this basename: CKTOTAL, CKMB, CKMBINDEX, TROPONINI,  in the last 72 hours Thyroid Function Tests No results found for this basename: TSH, T4TOTAL, FREET3, T3FREE, THYROIDAB,  in the last 72 hours  TELE  Sinus tachycardia 100-110   Radiology/Studies  Dg Chest 2 View  09/06/2013   CLINICAL DATA:  CHF, intermittent dyspnea  EXAM: CHEST  2 VIEW  COMPARISON:  Prior chest x-ray 08/13/2013; prior chest CT 08/22/2013  FINDINGS: Stable cardiac and mediastinal contours. Atherosclerotic calcifications are present within the transverse aorta. Moderate left and small right pleural effusions are similar  compared to prior. There is persistent associated bibasilar atelectasis. Mild pulmonary vascular congestion without overt edema. No pneumothorax. No new focal airspace consolidation. No acute osseous abnormality.  IMPRESSION: 1. Stable moderate left and small right pleural effusions and associated bibasilar atelectasis. 2. Pulmonary vascular congestion without overt edema.   Electronically Signed   By: Jacqulynn Cadet M.D.   On: 09/06/2013 07:55   Ct Chest Wo Contrast  08/22/2013   CLINICAL DATA:  Left lung cancer status post lobectomy. Weight gain. Renal insufficiency.  EXAM: CT CHEST WITHOUT CONTRAST  TECHNIQUE: Multidetector CT imaging of the chest was performed following the standard protocol without IV contrast.  COMPARISON:  Radiographs 08/13/2013 and 08/04/2013.  CT 08/02/2012.  FINDINGS: There are stable postsurgical changes related to prior left lower lobe resection. There has been interval enlargement of several mediastinal lymph nodes. These include 12 mm right paratracheal (image 19), 13 mm precarinal (image 23), and 14 mm AP window (image 25) lymph nodes. Some of these nodes have retained fatty hila. Allowing for the limitations of noncontrast technique, the hila appear stable.  There is stable low-density within the right thyroid lobe. Atherosclerosis of the aorta, great vessels and coronary arteries is noted. The heart size is normal. There is no pericardial effusion.  Moderate size dependent pleural effusions are present bilaterally. There is associated dependent airspace disease in both lower lobes. In addition, there are more focal airspace opacities within the right lower lobe and inferior aspect of the left upper lobe. The latter is somewhat nodular, measuring up to 1.8 cm on image 43. No other focal nodularity or endobronchial lesions are demonstrated.  The visualized upper abdomen has a stable appearance. There is no adrenal mass. There is increased subcutaneous edema throughout the  subcutaneous fat, especially within the anterior aspect of the upper abdomen.  No worrisome osseous findings are demonstrated.  IMPRESSION: 1. As demonstrated radiographically, there are bilateral pleural effusions and bilateral airspace opacities which are new compared with the prior CT. Although there are focal somewhat nodular components in the right lower and left upper lobes, these findings are most likely infectious/inflammatory. 2. Progressive mediastinal lymphadenopathy. Some of the nodes have retained fatty hila and may be reactive. Metastatic disease cannot be completely excluded. 3. Stable atherosclerosis and low-density within the right thyroid lobe. 4. If the airspace opacities and pleural effusions fail to respond to appropriate clinical therapy, follow-up CT or PET-CT may be warranted to exclude metastatic disease.   Electronically Signed   By: Camie Patience M.D.   On: 08/22/2013 10:26   Dg Chest Portable  1 View  08/13/2013   CLINICAL DATA:  Shortness of breath increasing for 2 weeks. History of CHF.  EXAM: PORTABLE CHEST - 1 VIEW  COMPARISON:  08/04/2013  FINDINGS: Again noted are bibasilar lung densities that are suggestive for pleural effusions and atelectasis. Heart size appears to be enlarged. There is some peribronchial thickening and cannot exclude pulmonary edema.  IMPRESSION: Bilateral pleural effusions with basilar atelectasis. There is mild pulmonary edema. Minimal change from the previous examination.   Electronically Signed   By: Markus Daft M.D.   On: 08/13/2013 23:10    ASSESSMENT AND PLAN 1. Cardiogenic shock 2. Acute on chronic systolic HF     EF ~88-28% 3. 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 Acute respiratory failure 5. 3-V CAD, severe.  6. Carotid stenosis     - doppler 08/31/2013 shows R 60-79% ICA stenosis, L 80-99% ICA stenosis     - vascular surgery aware 7. Lung CA  h/o LLL lobectomy (2 yr ago due to non-small cell carcinoma)      - PFT 09/05/2013 pre-FEV1 28%, pre-FVC 34%,  pre-FEV1/FVC ratio 68 8. DM1 9. Ischemic R foot s/p R femoral thrombectomy and 4-compartment fasciotomy.  10. VT/VF arrest 6/3 11. R axillary DVT 12. Severe protein calorie malnutrition  13. Large right groin wound s/p debridement 6/12 14. Anemia: Transfuse hemoglobin < 7.  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 Co-ox better on dobutamine 3. Will keep at current dose.   She is gaining fluid back. Will d/w Renal. Would try to get weight down a bit (don't need to get to baseline).  Wound care per [facility].  INR drifting down. Warfarin dosing d/w Pharmacy.   Continue to manage HF and CAD medically.   Hasson Gaspard,MD 8:18 AM

## 2013-10-17 NOTE — Progress Notes (Signed)
ANTICOAGULATION CONSULT NOTE - Follow Up Consult  Pharmacy Consult for Coumadin Indication: Thromboembolic state (RUE DVT)  Allergies  Allergen Reactions  . Crestor [Rosuvastatin] Other (See Comments)    Severe muscle weakness  . Nsaids Other (See Comments)    Not allergic, "bad on my kidneys"  . 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 Ciprofloxacin Rash    Patient Measurements: Height: 5' 7.5" (171.5 cm) Weight: 276 lb 14.4 oz (125.6 kg) IBW/kg (Calculated) : 62.75  Vital Signs: Temp: 97.7 F (36.5 C) (07/01 0756) Temp src: Oral (07/01 0756) BP: 110/70 mmHg (07/01 1045) Pulse Rate: 100 (07/01 1045)  Labs:  Recent Labs  10/15/13 0346 10/16/13 0455 10/17/13 0425  HGB 7.7* 8.2* 7.7*  HCT 25.8* 27.4* 25.1*  PLT 264 286 297  LABPROT 37.1* 25.4* 21.8*  INR 3.75* 2.31* 1.90*  CREATININE 3.01* 3.65* 5.07*    Estimated Creatinine Clearance: 19.4 ml/min (by C-G formula based on Cr of 5.07).  Assessment: 43yof continues on coumadin for RUE DVT. She has completed 5 days of overlap therapy with argatroban. Argatroban was stopped 6/25 due to therapeutic INR. INR (1.90) has now fallen back in the subtherapeutic range after holding a few days of Coumadin (resumed 6/30), however rate of fall has slowed.  Hopeful for INR rise tomorrow.  May be absorbing Coumadin poorly. - H/H and Plts fairly stable - No significant bleeding reported  Goal of Therapy: INR 2-3 Monitor platelets by anticoagulation protocol: Yes   Plan:  1. Coumadin 10 mg x 1 tonight.  Will likely need to reduce doses once INR back above 2. 2. Follow-up AM INR 3. Coumadin education eventually once discharge plan more clear.   Uvaldo Rising, BCPS  Clinical Pharmacist Pager [phone]  10/17/2013 10:51 AM

## 2013-10-17 NOTE — Progress Notes (Signed)
NUTRITION FOLLOW UP  DOCUMENTATION CODES Per approved criteria  -Morbid Obesity   INTERVENTION:  Continue Nepro Shake po TID, each supplement provides 425 kcal and 19 grams protein RD to follow for nutrition care plan  NUTRITION DIAGNOSIS: Increased nutrient needs related to wound healing, ESRD as evidenced by estimated nutrition needs, ongoing  Goal: Pt to meet >/= 90% of their estimated nutrition needs, met  Monitor:  PO & supplemental intake, weight, labs, I/O's  ASSESSMENT: 43 yo morbidly obese female with PMH of DM1, stage V CKD, blindness, h/o lung CA s/p resection; had a high risk stress test which showed ischemia in anterior and apical region. She underwent cath 09/04/2013 which showed chronically occluded RCA and triple vessel dx.   Underwent placement of swan and IABP last week for shock (co-ox 38%). Unfortunately developed cold foot and IABP had to be removed.Underwent R femoral thrombectomy and 4 compartment fasciotomy of RLE on 6/1.On 6/3 had VT/VF->PEA arrest 10 mins CPR. U/s shows R axillary DVT.  Patient s/p procedure 6/12: I&D RIGHT GROIN, INCLUDING SKIN, SUBCUTANEOUS TISSUE PLACEMENT OF WOUND VAC   Patient now on intermittent HD.  Noted some episodes of nausea and vomiting 6/27 -- resolved.  Patient continues to eat well.  PO intake 100% per flowsheet records.  Drinking her Nepro Shake supplements.  Height: Ht Readings from Last 1 Encounters:  09/03/13 5' 7.5" (1.715 m)    Weight: Wt Readings from Last 1 Encounters:  10/17/13 270 lb 11.6 oz (122.8 kg)    BMI:  Body mass index is 41.75 kg/(m^2).  Estimated Nutritional Needs: Kcal: 2000-2200 Protein: 115-125 gm Fluid: 2.0-2.2 L  Skin: surgical wound to right groin -- VAC  Diet Order: Diabetic   Intake/Output Summary (Last 24 hours) at 10/17/13 1438 Last data filed at 10/17/13 1300  Gross per 24 hour  Intake  479.6 ml  Output   3028 ml  Net -2548.4 ml    Labs:   Recent Labs Lab  10/15/13 0346 10/16/13 0455 10/17/13 0425  NA 134* 136* 131*  K 4.6 4.4 4.6  CL 97 96 92*  CO2 26 28 27   BUN 24* 27* 40*  CREATININE 3.01* 3.65* 5.07*  CALCIUM 7.9* 8.3* 8.4  MG 2.5 2.2 2.2  PHOS 3.0 4.2 5.2*  GLUCOSE 160* 142* 114*    CBG (last 3)   Recent Labs  10/16/13 2205 10/17/13 0754 10/17/13 1203  GLUCAP 137* 117* 118*    Scheduled Meds: . aspirin  81 mg Oral Daily  . atorvastatin  80 mg Oral q1800  . calcitRIOL  0.25 mcg Oral Daily  . darbepoetin (ARANESP) injection - NON-DIALYSIS  200 mcg Subcutaneous Q Wed-1800  . feeding supplement (NEPRO CARB STEADY)  237 mL Oral TID WC  . fludrocortisone  0.1 mg Oral Daily  . insulin aspart  0-15 Units Subcutaneous TID WC  . insulin aspart  0-5 Units Subcutaneous QHS  . insulin aspart  4 Units Subcutaneous TID WC  . insulin glargine  35 Units Subcutaneous Q24H  . levETIRAcetam  1,000 mg Oral BID  . lidocaine (PF)  5 mL Other Once  . midodrine  10 mg Oral TID WC  . pantoprazole  40 mg Oral Daily  . polyethylene glycol  17 g Oral Daily  . sodium chloride  10-40 mL Intracatheter Q12H  . warfarin  10 mg Oral ONCE-1800  . Warfarin - Pharmacist Dosing Inpatient   Does not apply q1800    Continuous Infusions: . DOBUTamine 3 mcg/kg/min (10/17/13  1300)    Past Medical History  Diagnosis Date  . High cholesterol   . Diabetic retinopathy   . Peripheral neuropathy     "tips of toes"  . Blind right eye   . CHF (congestive heart failure)   . CAD (coronary artery disease)   . GERD (gastroesophageal reflux disease)   . Hypertension   . History of lung cancer 07/2011    s/p left lower lobectomy  . Diabetes mellitus     IDDM  . Cataract of right eye   . Ulcer of toe of right foot 07/10/2012    great toe  . Breast calcification, left 06/2012  . Acute biphenotypic leukemia   . CKD (chronic kidney disease) stage 5, GFR less than 15 ml/min   . Nephrotic syndrome   . Gastritis     H/o gastritis on prior endoscopy  .  Anemia   . Carotid artery disease     Past Surgical History  Procedure Laterality Date  . Cardiac catheterization  07/16/2011  . Incision and drainage breast abscess Left   . Tubal ligation  1994  . Vitrectomy  2010    2 on left, 1 on right  . Cesarean section  1991; 1994  . Video assisted thoracoscopy (vats)/ lobectomy Left 07/30/2011    left main thoracotomy, left lower lobectomy, mediastinal lymph node dissection  . Lobectomy    . Colonoscopy with esophagogastroduodenoscopy (egd) N/A 08/14/2012    Procedure: COLONOSCOPY WITH ESOPHAGOGASTRODUODENOSCOPY (EGD);  Surgeon: Danie Binder, MD;  [facility];  Service: Endoscopy;  Laterality: N/A;  10:45-moved to [address] to notify pt  . Breast lumpectomy with needle localization Left 11/14/2012    Procedure: BREAST LUMPECTOMY WITH NEEDLE LOCALIZATION;  Surgeon: Marcello Moores A. Cornett, MD;  Location: [place];  Service: General;  Laterality: Left;  . Insertion of dialysis catheter Left 09/12/2013    Procedure: INSERTION OF DIALYSIS CATHETER;  Surgeon: Mal Misty, MD;  Location: [place];  Service: Vascular;  Laterality: Left;  . Embolectomy Right 09/17/2013    Procedure: Thrombectomy of Right Common Femoral Artery;  Surgeon: Elam Dutch, MD;  Location[facility] OR;  Service: Vascular;  Laterality: Right;  . Endarterectomy femoral Right 09/17/2013    Procedure: Right Femoral Endarterectomy;  Surgeon: Elam Dutch, MD;  Location: [facility] OR;  Service: Vascular;  Laterality: Right;  . Patch angioplasty Right 09/17/2013    Procedure: Vein Patch Angioplasty of Right Femoral Artery;  Surgeon: Elam Dutch, MD;  Location: [place];  Service: Vascular;  Laterality: Right;  . Fasciotomy Right 09/17/2013    Procedure: Four Compartment Fasciotomy;  Surgeon: Elam Dutch, MD;  Location: [place];  Service: Vascular;  Laterality: Right;  . Fasciotomy closure Right 09/19/2013    Procedure: FASCIOTOMY CLOSURE;  Surgeon: Elam Dutch, MD;  Location: [place];   Service: Vascular;  Laterality: Right;  regional block and monitored anesthesia care used  . I&d extremity Right 09/28/2013    Procedure: IRRIGATION AND DEBRIDEMENT EXTREMITY;  Surgeon: Serafina Mitchell, MD;  Location: [facility] OR;  Service: Vascular;  Laterality: Right;  . Application of wound vac Right 09/28/2013    Procedure: APPLICATION OF WOUND VAC;  Surgeon: Serafina Mitchell, MD;  Location: [place];  Service: Vascular;  Laterality: Right;    Arthur Holms, RD, LDN Pager #: [phone] After-Hours Pager #: [phone]

## 2013-10-17 NOTE — Progress Notes (Signed)
Inpatient Diabetes Program Recommendations  AACE/ADA: New Consensus Statement on Inpatient Glycemic Control (2013)  Target Ranges:  Prepandial:   less than 140 mg/dL      Peak postprandial:   less than 180 mg/dL (1-2 hours)      Critically ill patients:  140 - 180 mg/dL   Reason for Assessment:  Results for MALORY, SPURR (MRN [id number]) as of 10/17/2013 09:14  Ref. Range 10/16/2013 15:51 10/16/2013 16:08 10/16/2013 16:29 10/16/2013 22:05 10/17/2013 07:54  Glucose-Capillary Latest Range: 70-99 mg/dL 68 (L) 59 (L) 85 137 (H) 117 (H)    Please consider reducing Lantus to 35 units daily and reduce Novolog meal coverage to 4 units tid wc.    Adah Perl, RN, BC-ADM Inpatient Diabetes Coordinator Pager [phone]

## 2013-10-17 NOTE — Procedures (Signed)
Patient was seen on dialysis and the procedure was supervised. BFR 350 [facility] BP is 110/70.  Patient appears to be tolerating treatment well.  She is on dobutamine 73mcg/kg/min and received midodrine prior to HD. Goal UF of 3L today given edema.

## 2013-10-18 DIAGNOSIS — I2510 Atherosclerotic heart disease of native coronary artery without angina pectoris: Secondary | ICD-10-CM

## 2013-10-18 DIAGNOSIS — I7090 Unspecified atherosclerosis: Secondary | ICD-10-CM

## 2013-10-18 LAB — GLUCOSE, CAPILLARY
Glucose-Capillary: 128 mg/dL — ABNORMAL HIGH (ref 70–99)
Glucose-Capillary: 162 mg/dL — ABNORMAL HIGH (ref 70–99)
Glucose-Capillary: 143 mg/dL — ABNORMAL HIGH (ref 70–99)
Glucose-Capillary: 201 mg/dL — ABNORMAL HIGH (ref 70–99)

## 2013-10-18 LAB — CBC
HEMATOCRIT: 24.9 % — AB (ref 36.0–46.0)
HEMOGLOBIN: 7.6 g/dL — AB (ref 12.0–15.0)
MCH: 28.3 pg (ref 26.0–34.0)
MCHC: 30.5 g/dL (ref 30.0–36.0)
MCV: 92.6 fL (ref 78.0–100.0)
Platelets: 300 10*3/uL (ref 150–400)
RBC: 2.69 MIL/uL — AB (ref 3.87–5.11)
RDW: 17.3 % — AB (ref 11.5–15.5)
WBC: 5.5 10*3/uL (ref 4.0–10.5)

## 2013-10-18 LAB — RENAL FUNCTION PANEL
ANION GAP: 13 (ref 5–15)
Albumin: 2.2 g/dL — ABNORMAL LOW (ref 3.5–5.2)
BUN: 36 mg/dL — AB (ref 6–23)
CO2: 26 mEq/L (ref 19–32)
Calcium: 7.9 mg/dL — ABNORMAL LOW (ref 8.4–10.5)
Chloride: 93 mEq/L — ABNORMAL LOW (ref 96–112)
Creatinine, Ser: 4.65 mg/dL — ABNORMAL HIGH (ref 0.50–1.10)
GFR calc Af Amer: 12 mL/min — ABNORMAL LOW (ref >90)
GFR, EST NON AFRICAN AMERICAN: 10 mL/min — AB (ref >90)
Glucose, Bld: 132 mg/dL — ABNORMAL HIGH (ref 70–99)
PHOSPHORUS: 5 mg/dL — AB (ref 2.3–4.6)
Potassium: 4.2 mEq/L (ref 3.7–5.3)
SODIUM: 132 meq/L — AB (ref 137–147)

## 2013-10-18 LAB — CARBOXYHEMOGLOBIN
CARBOXYHEMOGLOBIN: 2.3 % — AB (ref 0.5–1.5)
Methemoglobin: 0.8 % (ref 0.0–1.5)
O2 SAT: 67.8 %
Total hemoglobin: 7.9 g/dL — ABNORMAL LOW (ref 12.0–16.0)

## 2013-10-18 LAB — MAGNESIUM: Magnesium: 2.3 mg/dL (ref 1.5–2.5)

## 2013-10-18 LAB — PROTIME-INR
INR: 2.48 — ABNORMAL HIGH (ref 0.00–1.49)
Prothrombin Time: 26.8 seconds — ABNORMAL HIGH (ref 11.6–15.2)

## 2013-10-18 MED ORDER — WARFARIN SODIUM 5 MG PO TABS
5.0000 mg | ORAL_TABLET | Freq: Once | ORAL | Status: AC
Start: 2013-10-18 — End: 2013-10-18
  Administered 2013-10-18: 5 mg via ORAL
  Filled 2013-10-18: qty 1

## 2013-10-18 NOTE — Consult Note (Addendum)
WOC wound consult note Reason for Consult:Consult requested to assist bedside nurse with vac dressing changes.  VVS team is following for assessment and plan of care to right leg wounds in 2 locations and right groin wound. Wound type: Right groin wound 25% slough, 75% red.  Mod amt yellow drainage in vac cannister, no odor.  6X10X4cm. Bedside nurse applied 2 pieces black foam to 75cm cont suction.   Inner calf fasciotomy 10% yellow, 90% red.  8.5X3X.5cm, mod amt yellow drainage, no odor.  Applied one piece black foam to 96mm cont suction. Outer calf fasciotomy 10% yellow, 90% red. Exposed tendon visible,  6X1.5X.3cm, mod amt yellow drainage, no odor.  Applied one piece black foam to 48mm cont suction. Pt tolerated dressing changes with minimal discomfort.  Pain meds given prior to procedure.  Mepitel contact layer applied to leg wounds to decrease discomfort with dressing changes.  Plan for bedside nurses to change dressings Q Tues/Thurs/Sat.  Progress notes indicate that VVS team plans further debridement of right groin wound at some point.   Please re-consult if further assistance is needed.  Thank-you,  Julien Girt MSN, [place], [place], [place], [place]

## 2013-10-18 NOTE — Progress Notes (Addendum)
Patient ID: Hannah [name], female   DOB: 04/07/1971, 43 y.o.   MRN: [id number] S:feels well O:BP 86/53  Pulse 102  Temp(Src) 98 F (36.7 C) (Oral)  Resp 11  Ht 5' 7.5" (1.715 m)  Wt 122.9 kg (270 lb 15.1 oz)  BMI 41.79 kg/m2  SpO2 98%  LMP 08/19/2013  Intake/Output Summary (Last 24 hours) at 10/18/13 1305 Last data filed at 10/18/13 1000  Gross per 24 hour  Intake  459.2 ml  Output    200 ml  Net  259.2 ml   Intake/Output: I/O last 3 completed shifts: In: 307.2 [P.O.:120; I.V.:187.2] Out: 3103 [Urine:100; Drains:100; GDJME:2683]  Intake/Output this shift:  Total I/O In: 365.6 [P.O.:350; I.V.:15.6] Out: -  Weight change: 1.6 kg (3 lb 8.4 oz) Gen:WD WN in NAD CVS:no rub Resp:cta MHD:QQIWLN Ext:+edema, wound vac in right leg   Recent Labs Lab 10/13/13 0325 10/13/13 0600 10/13/13 1515 10/14/13 0412 10/15/13 0346 10/16/13 0455 10/17/13 0425 10/18/13 0500  NA 134*  --  136* 137 134* 136* 131* 132*  K 4.5  --  4.5 4.4 4.6 4.4 4.6 4.2  CL 97  --  98 99 97 96 92* 93*  CO2 25  --  26 28 26 28 27 26   GLUCOSE 137*  --  65* 46* 160* 142* 114* 132*  BUN 29*  --  24* 19 24* 27* 40* 36*  CREATININE 2.45*  --  2.22* 2.07* 3.01* 3.65* 5.07* 4.65*  ALBUMIN 2.0* 2.0* 2.0* 2.0* 1.9* 2.4* 2.3* 2.2*  CALCIUM 7.8*  --  8.1* 7.9* 7.9* 8.3* 8.4 7.9*  PHOS 3.0  --  2.1* 2.2* 3.0 4.2 5.2* 5.0*  AST  --  20  --   --   --   --   --   --   ALT  --  15  --   --   --   --   --   --    Liver Function Tests:  Recent Labs Lab 10/13/13 0600  10/16/13 0455 10/17/13 0425 10/18/13 0500  AST 20  --   --   --   --   ALT 15  --   --   --   --   ALKPHOS 138*  --   --   --   --   BILITOT 0.3  --   --   --   --   PROT 5.6*  --   --   --   --   ALBUMIN 2.0*  < > 2.4* 2.3* 2.2*  < > = values in this interval not displayed. No results found for this basename: LIPASE, AMYLASE,  in the last 168 hours No results found for this basename: AMMONIA,  in the last 168 hours CBC:  Recent Labs Lab  10/14/13 0412 10/15/13 0346 10/16/13 0455 10/17/13 0425 10/18/13 0510  WBC 4.1 5.4 5.6 5.8 5.5  HGB 8.0* 7.7* 8.2* 7.7* 7.6*  HCT 26.2* 25.8* 27.4* 25.1* 24.9*  MCV 96.7 95.9 96.8 93.3 92.6  PLT 242 264 286 297 300   Cardiac Enzymes: No results found for this basename: CKTOTAL, CKMB, CKMBINDEX, TROPONINI,  in the last 168 hours CBG:  Recent Labs Lab 10/17/13 1203 10/17/13 1650 10/17/13 2142 10/18/13 0810 10/18/13 1229  GLUCAP 118* 172* 240* 128* 162*    Iron Studies: No results found for this basename: IRON, TIBC, TRANSFERRIN, FERRITIN,  in the last 72 hours Studies/Results: No results found. Marland Kitchen aspirin  81 mg Oral Daily  .  atorvastatin  80 mg Oral q1800  . calcitRIOL  0.25 mcg Oral Daily  . darbepoetin (ARANESP) injection - NON-DIALYSIS  200 mcg Subcutaneous Q Wed-1800  . feeding supplement (NEPRO CARB STEADY)  237 mL Oral TID WC  . fludrocortisone  0.1 mg Oral Daily  . insulin aspart  0-15 Units Subcutaneous TID WC  . insulin aspart  0-5 Units Subcutaneous QHS  . insulin aspart  4 Units Subcutaneous TID WC  . insulin glargine  35 Units Subcutaneous Q24H  . levETIRAcetam  1,000 mg Oral BID  . lidocaine (PF)  5 mL Other Once  . midodrine  10 mg Oral TID WC  . pantoprazole  40 mg Oral Daily  . polyethylene glycol  17 g Oral Daily  . sodium chloride  10-40 mL Intracatheter Q12H  . warfarin  5 mg Oral ONCE-1800  . Warfarin - Pharmacist Dosing Inpatient   Does not apply q1800    BMET    Component Value Date/Time   NA 132* 10/18/2013 0500   K 4.2 10/18/2013 0500   CL 93* 10/18/2013 0500   CO2 26 10/18/2013 0500   GLUCOSE 132* 10/18/2013 0500   BUN 36* 10/18/2013 0500   CREATININE 4.65* 10/18/2013 0500   CREATININE 4.17* 07/06/2013 1618   CALCIUM 7.9* 10/18/2013 0500   GFRNONAA 10* 10/18/2013 0500   GFRNONAA 71 07/13/2011 0835   GFRAA 12* 10/18/2013 0500   GFRAA 82 07/13/2011 0835   CBC    Component Value Date/Time   WBC 5.5 10/18/2013 0510   RBC 2.69* 10/18/2013 0510   HGB 7.6*  10/18/2013 0510   HCT 24.9* 10/18/2013 0510   PLT 300 10/18/2013 0510   MCV 92.6 10/18/2013 0510   MCH 28.3 10/18/2013 0510   MCHC 30.5 10/18/2013 0510   RDW 17.3* 10/18/2013 0510   LYMPHSABS 1.4 08/04/2013 1223   MONOABS 0.4 08/04/2013 1223   EOSABS 0.2 08/04/2013 1223   BASOSABS 0.1 08/04/2013 1223     Assessment/Plan:  1. ESRD- (dialysis dependent since 5/21 and off CVVHD 6/28 and first IHD 6/29) tolerated IHD well yesterday.  1. Has tolerated IHD well 2. Cont with HD qMWF and may do extra session on Sat for UF depending upon cardiac status. 2. 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  3. 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 Should repeat panel: ordered 10/13/13 and resulted today as negative so should be able to use heparin with HD and will follow platelets. 5. Right axillary DVT 6. Bilateral carotid stenosis 7. DM- on insulin 8. S/p right femoral endarterectomy with wound deehiscence and now on vac (episode of vascular insufficiency with cold foot after IABP) 9. 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 VT/VF then PEA arrest 6/3. Stable since. [place]

## 2013-10-18 NOTE — Progress Notes (Signed)
ANTICOAGULATION CONSULT NOTE - Follow Up Consult  Pharmacy Consult for Coumadin Indication: Thromboembolic state (RUE DVT)  Allergies  Allergen Reactions  . Crestor [Rosuvastatin] Other (See Comments)    Severe muscle weakness  . Nsaids Other (See Comments)    Not allergic, "bad on my kidneys"  . 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 Ciprofloxacin Rash    Patient Measurements: Height: 5' 7.5" (171.5 cm) Weight: 270 lb 15.1 oz (122.9 kg) IBW/kg (Calculated) : 62.75  Vital Signs: Temp: 97.7 F (36.5 C) (07/02 0818) Temp src: Oral (07/02 0818) BP: 118/71 mmHg (07/02 0700) Pulse Rate: 104 (07/02 0700)  Labs:  Recent Labs  10/16/13 0455 10/17/13 0425 10/18/13 0500 10/18/13 0510  HGB 8.2* 7.7*  --  7.6*  HCT 27.4* 25.1*  --  24.9*  PLT 286 297  --  300  LABPROT 25.4* 21.8*  --  26.8*  INR 2.31* 1.90*  --  2.48*  CREATININE 3.65* 5.07* 4.65*  --     Estimated Creatinine Clearance: 20.9 ml/min (by C-G formula based on Cr of 4.65).  Assessment: Hannah [name] continues on coumadin for RUE DVT. She has completed 5 days of overlap therapy with argatroban. Argatroban was stopped 6/25 due to therapeutic INR. INR (2.48) now therapeutic again and rising quickly after 2 doses of Coumadin (resumed 6/30).     - H/H and Plts fairly stable - No significant bleeding reported  Goal of Therapy: INR 2-3 Monitor platelets by anticoagulation protocol: Yes   Plan:  1. Coumadin 5 mg x 1 tonight.  May need to back down on dose even further tomorrow. 2. Follow-up AM INR 3. Coumadin education eventually once discharge plan more clear.   Uvaldo Rising, BCPS  Clinical Pharmacist Pager [phone]  10/18/2013 8:26 AM

## 2013-10-18 NOTE — Progress Notes (Signed)
Physical Therapy Treatment Patient Details Name: Hannah [name] MRN: [id number] DOB: [birth year] Today's Date: 10/18/2013    History of Present Illness 43 yo WF with  DM, advanced CKD 5 at baseline.  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 Developed cardiogenic shock, cold foot after IABP requiring femoral thrombectomy and fasciotomy (6/1), VT/VF then PEA arrest 6/3. She required initiation of RRT 09/06/13 and has remained dialysis dependent.  Short trial of IHD unsuccessful and back to CRRT as of 6/4 with  inotrope requirement and inability to wean. Pt transitioned to intermittent HD as of 6/29.Marland Kitchen    PT Comments    Pt doing well. Shorter amb due to soreness/pain in rt calf.  Follow Up Recommendations  Home health PT;Supervision/Assistance - 24 hour     Equipment Recommendations  Rolling walker with 5" wheels    Recommendations for Other Services       Precautions / Restrictions Precautions Precautions: Fall Precaution Comments: Has had myoclonic jerks with buckling resulting in fall. Currently no myoclonic jerks.    Mobility  Bed Mobility Overal bed mobility: Needs Assistance Bed Mobility: Sit to Supine       Sit to supine: Min assist   General bed mobility comments: Assist to bring legs back up into bed  Transfers Overall transfer level: Needs assistance Equipment used: Rolling walker (2 wheeled) Transfers: Sit to/from Stand Sit to Stand: +2 physical assistance;Min assist         General transfer comment: Assist to bring hips up from low chair.  Ambulation/Gait Ambulation/Gait assistance: Min assist;+2 safety/equipment Ambulation Distance (Feet): 70 Feet Assistive device: Rolling walker (2 wheeled) Gait Pattern/deviations: Step-through pattern;Decreased step length - right;Decreased step length - left Gait velocity: decr Gait velocity interpretation: Below normal speed for age/gender General Gait  Details: Followed pt closely with chair due to history of buckling due to myoclonic jerks.   Stairs            Wheelchair Mobility    Modified Rankin (Stroke Patients Only)       Balance   Sitting-balance support: No upper extremity supported;Feet supported Sitting balance-Leahy Scale: Good     Standing balance support: Bilateral upper extremity supported Standing balance-Leahy Scale: Poor                      Cognition Arousal/Alertness: Awake/alert Behavior During Therapy: WFL for tasks assessed/performed Overall Cognitive Status: Within Functional Limits for tasks assessed                      Exercises      General Comments        Pertinent Vitals/Pain VSS. Soreness/pain in rt calf    Home Living                      Prior Function            PT Goals (current goals can now be found in the care plan section) Progress towards PT goals: Progressing toward goals    Frequency  Min 3X/week    PT Plan Current plan remains appropriate    Co-evaluation             End of Session Equipment Utilized During Treatment: Gait belt Activity Tolerance: Patient tolerated treatment well Patient left: in bed;with call bell/phone within reach     Time: 1035-1100 PT Time Calculation (min):  25 min  Charges:  $Gait Training: 23-37 mins                    G Codes:      Wendy Mikles 11/07/2013, 11:34 AM  Suanne Marker PT [phone]

## 2013-10-18 NOTE — Progress Notes (Signed)
Patient ID: Hannah [name], female   DOB: Dec 13, 1970, 43 y.o.   MRN: [id number]  ADVANCED HEART FAILURE ROUNDING NOTE   SUBJECTIVE  43 yo female with PMH of DM1, stage V kidney disease, blindness, h/o lung CA s/p resection who was recently discharged for heart failure symptom had a high risk stress test which showed ischemia in anterior and apical region. She underwent cath 09/04/2013 which showed chronically occluded RCA and triple vessel dx. CT surgery consulted but not felt to be surgical candidate due to poor PFTs, suboptimal targets, comorbidities and shock. EF 22% by Myoview. 30-35% by echo  Underwent placement of swan and IABP for shock (co-ox 38%). Unfortunately developed cold foot and IABP had to be removed.Underwent R femoral thrombectomy and 4 compartment fasciotomy of RLE on 6/1.On 6/3 had VT/VF->PEA arrest 10 mins CPR. U/s shows R axillary DVT  Now on intermittent HD. Dobutamine weaned down to 2.5 however CO-OX down. So dobutamine increased to 3. Co ox today 67%. Weight even. Denies SOB. Wounds healing very slowly.    Denies dyspnea or orthopnea.    CURRENT MEDS . aspirin  81 mg Oral Daily  . atorvastatin  80 mg Oral q1800  . calcitRIOL  0.25 mcg Oral Daily  . darbepoetin (ARANESP) injection - NON-DIALYSIS  200 mcg Subcutaneous Q Wed-1800  . feeding supplement (NEPRO CARB STEADY)  237 mL Oral TID WC  . fludrocortisone  0.1 mg Oral Daily  . insulin aspart  0-15 Units Subcutaneous TID WC  . insulin aspart  0-5 Units Subcutaneous QHS  . insulin aspart  4 Units Subcutaneous TID WC  . insulin glargine  35 Units Subcutaneous Q24H  . levETIRAcetam  1,000 mg Oral BID  . lidocaine (PF)  5 mL Other Once  . midodrine  10 mg Oral TID WC  . pantoprazole  40 mg Oral Daily  . polyethylene glycol  17 g Oral Daily  . sodium chloride  10-40 mL Intracatheter Q12H  . Warfarin - Pharmacist Dosing Inpatient   Does not apply q1800    OBJECTIVE  Filed Vitals:   10/18/13 1500 10/18/13 1556  10/18/13 1600 10/18/13 1700  BP: 110/59  109/72 93/69  Pulse: 106  109 106  Temp:  97.6 F (36.4 C)    TempSrc:  Oral    Resp: 9  15 13   Height:      Weight:      SpO2: 97%  98% 98%    Intake/Output Summary (Last 24 hours) at 10/18/13 1800 Last data filed at 10/18/13 1800  Gross per 24 hour  Intake 1373.6 ml  Output    100 ml  Net 1273.6 ml   Filed Weights   10/17/13 0710 10/17/13 1139 10/18/13 0500  Weight: 125.6 kg (276 lb 14.4 oz) 122.8 kg (270 lb 11.6 oz) 122.9 kg (270 lb 15.1 oz)    PHYSICAL EXAM  CVP 14 General: Lying in bed.  NAD Neuro: Alert and oriented X 3. HEENT:  Normal  Neck: Supple without bruits. Chest L subclav PC. R subclav TLC. JVP up Lungs:  clear Heart: tachy regular.  Abdomen: Soft, non-tender, non-distended, BS + x 4.  Extremities: 2+ edema bilateral ankles. Wound vac in R groin and RLE  CBC  Recent Labs  10/17/13 0425 10/18/13 0510  WBC 5.8 5.5  HGB 7.7* 7.6*  HCT 25.1* 24.9*  MCV 93.3 92.6  PLT 297 696   Basic Metabolic Panel  Recent Labs  10/17/13 0425 10/18/13 0500 10/18/13 0510  NA  131* 132*  --   K 4.6 4.2  --   CL 92* 93*  --   CO2 27 26  --   GLUCOSE 114* 132*  --   BUN 40* 36*  --   CREATININE 5.07* 4.65*  --   CALCIUM 8.4 7.9*  --   MG 2.2  --  2.3  PHOS 5.2* 5.0*  --    Liver Function Tests  Recent Labs  10/17/13 0425 10/18/13 0500  ALBUMIN 2.3* 2.2*   Cardiac Enzymes No results found for this basename: CKTOTAL, CKMB, CKMBINDEX, TROPONINI,  in the last 72 hours Thyroid Function Tests No results found for this basename: TSH, T4TOTAL, FREET3, T3FREE, THYROIDAB,  in the last 72 hours  TELE  Sinus tachycardia 100-110   Radiology/Studies  Dg Chest 2 View  09/06/2013   CLINICAL DATA:  CHF, intermittent dyspnea  EXAM: CHEST  2 VIEW  COMPARISON:  Prior chest x-ray 08/13/2013; prior chest CT 08/22/2013  FINDINGS: Stable cardiac and mediastinal contours. Atherosclerotic calcifications are present within the  transverse aorta. Moderate left and small right pleural effusions are similar compared to prior. There is persistent associated bibasilar atelectasis. Mild pulmonary vascular congestion without overt edema. No pneumothorax. No new focal airspace consolidation. No acute osseous abnormality.  IMPRESSION: 1. Stable moderate left and small right pleural effusions and associated bibasilar atelectasis. 2. Pulmonary vascular congestion without overt edema.   Electronically Signed   By: Jacqulynn Cadet M.D.   On: 09/06/2013 07:55   Ct Chest Wo Contrast  08/22/2013   CLINICAL DATA:  Left lung cancer status post lobectomy. Weight gain. Renal insufficiency.  EXAM: CT CHEST WITHOUT CONTRAST  TECHNIQUE: Multidetector CT imaging of the chest was performed following the standard protocol without IV contrast.  COMPARISON:  Radiographs 08/13/2013 and 08/04/2013.  CT 08/02/2012.  FINDINGS: There are stable postsurgical changes related to prior left lower lobe resection. There has been interval enlargement of several mediastinal lymph nodes. These include 12 mm right paratracheal (image 19), 13 mm precarinal (image 23), and 14 mm AP window (image 25) lymph nodes. Some of these nodes have retained fatty hila. Allowing for the limitations of noncontrast technique, the hila appear stable.  There is stable low-density within the right thyroid lobe. Atherosclerosis of the aorta, great vessels and coronary arteries is noted. The heart size is normal. There is no pericardial effusion.  Moderate size dependent pleural effusions are present bilaterally. There is associated dependent airspace disease in both lower lobes. In addition, there are more focal airspace opacities within the right lower lobe and inferior aspect of the left upper lobe. The latter is somewhat nodular, measuring up to 1.8 cm on image 43. No other focal nodularity or endobronchial lesions are demonstrated.  The visualized upper abdomen has a stable appearance. There is  no adrenal mass. There is increased subcutaneous edema throughout the subcutaneous fat, especially within the anterior aspect of the upper abdomen.  No worrisome osseous findings are demonstrated.  IMPRESSION: 1. As demonstrated radiographically, there are bilateral pleural effusions and bilateral airspace opacities which are new compared with the prior CT. Although there are focal somewhat nodular components in the right lower and left upper lobes, these findings are most likely infectious/inflammatory. 2. Progressive mediastinal lymphadenopathy. Some of the nodes have retained fatty hila and may be reactive. Metastatic disease cannot be completely excluded. 3. Stable atherosclerosis and low-density within the right thyroid lobe. 4. If the airspace opacities and pleural effusions fail to respond to appropriate clinical  therapy, follow-up CT or PET-CT may be warranted to exclude metastatic disease.   Electronically Signed   By: Camie Patience M.D.   On: 08/22/2013 10:26   Dg Chest Portable 1 View  08/13/2013   CLINICAL DATA:  Shortness of breath increasing for 2 weeks. History of CHF.  EXAM: PORTABLE CHEST - 1 VIEW  COMPARISON:  08/04/2013  FINDINGS: Again noted are bibasilar lung densities that are suggestive for pleural effusions and atelectasis. Heart size appears to be enlarged. There is some peribronchial thickening and cannot exclude pulmonary edema.  IMPRESSION: Bilateral pleural effusions with basilar atelectasis. There is mild pulmonary edema. Minimal change from the previous examination.   Electronically Signed   By: Markus Daft M.D.   On: 08/13/2013 23:10    ASSESSMENT AND PLAN 1. Cardiogenic shock 2. Acute on chronic systolic HF     EF ~03-47% 3. 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 Acute respiratory failure 5. 3-V CAD, severe.  6. Carotid stenosis     - doppler 08/31/2013 shows R 60-79% ICA stenosis, L 80-99% ICA stenosis     - vascular surgery aware 7. 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 DM1 9. Ischemic R foot s/p R femoral thrombectomy and 4-compartment fasciotomy.  10. VT/VF arrest 6/3 11. R axillary DVT 12. Severe protein calorie malnutrition  13. Large right groin wound s/p debridement 6/12 14. Anemia: Transfuse hemoglobin < 7.  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 Co-ox better on dobutamine 3. Will try again to wean to 2.5,  Her weight is up but seems to be stabilizing. Renal working to try to get weight down a bit (don't need to get to baseline).  Wound care per VVS. She may need further debridement of groin wound.   INR 2.5. Warfarin dosing d/w Pharmacy. Watch CBC as HGb drifting down slowly again.   Continue to manage HF and CAD medically.   Daniel Bensimhon,MD 6:03 PM

## 2013-10-18 NOTE — Care Management Note (Addendum)
Page 1 of 2   10/29/2013     4:08:58 PM CARE MANAGEMENT NOTE 10/29/2013  Patient:  Hannah [name], Hannah [name]   Account Number:  [id number]  Date Initiated:  09/07/2013  Documentation initiated by:  Elissa Hefty  Subjective/Objective Assessment:   in w cor syndrome     Action/Plan:   lives w husband, pcp dr Kevan Rosebush   Anticipated DC Date:  10/29/2013   Anticipated DC Plan:  [place]  CM consult      Choice offered to / List presented to:     DME arranged  [place]      DME agency  [facility] arranged  HH-1 RN  [place].   Status of service:  Completed, signed off Medicare Important Message given?  YES (If response is "NO", the following Medicare IM given date fields will be blank) Date Medicare IM given:  10/26/2013 Medicare IM given by:  [facility] Date Additional Medicare IM given:  10/29/2013 Additional Medicare IM given by:  Elissa Hefty  Discharge Disposition:  [place]  Per UR Regulation:  Reviewed for med. necessity/level of care/duration of stay  If discussed at [place] of Stay Meetings, dates discussed:   09/11/2013  09/13/2013  09/18/2013  09/20/2013  09/25/2013  09/27/2013  10/02/2013  10/04/2013  10/09/2013  10/11/2013  10/16/2013  10/18/2013  10/23/2013  10/30/2013    Comments:  7/13  0919 debbie Kariyah Baugh rn,bsn have alerted [facility] donna of dc planned for today. hhrn for wound care. vac is off. hhpt arranged also.alerted jermaine w [facility] of need for w/c w cushion and pt being dc today.  10-26-13 9:30am Luz Lex, [place] Hoping to go home on Monday. Eating, sitting on side of bed.  Talked with Dr. Posey Pronto - still no word on time for dialysis  - hope to hear today. Plan is for OP center with [place] in [place] on [address].  PT recommending HHPT and RNfor dressing changes. Will also need RW  - discussed choice of agencies -  opted to go with [facility] for [facility] and RW. patient states may not need wound VAC - will be doing dressing change again tomorrow and may decide that she does not need on dc - just wet to dry. IF WE NEED WILL NEED VAC FORM ON CHART COMPLETED.  Will alert w/e CM to follow and [employer] rep.  Is at home with husband - who works until 3:30, and her daughters.  States husband can assist do dressing changes - daughters probably will not be able to.  Talked with [employer] rep - possible VAC Monday - insurance?? Blue Medicare or Self? Sending form over in case no insurance at this time to sign as may be eligible for indegent.   Over the weekend if does need and is signed off on can fax to main [employer] number. Milta Deiters contact number [phone].  Accepted into Dialysis center in [place].  first session there is Wednesday July 15.  Will get dialysis Monday here. SW worked on transportation.  Will be placed on waiting list but it is the Medicaid insurace only that get this ride.  Will need to apply for Medicaid asap.  Need to talk to her about transportation starting  Wednesday as at this point there is not anything set up. She is in dialysis at this time.  10/23/13 [place] MSN BSN CCM In HD, dobutamine gtt weaned.  10/22/13 [place] RN MSN BSN CCM Per RN, pt in HD, has required daily HD x several days.  10/18/13 1110 [place] MSN BSN CCM Now on (I) HD, remains on dobutamine gtt.  10/11/13 [place] MSN BSN CCM Unable to wean dobutamine, ? possibility of peritoneal dialysis.  Investigated payor as accounting record states pt's Blue Medicare termed 09/16/13 and pt is now listed as self pay.  TC to spouse to request he bring in pt's medicare card to be scanned into the system. 1557 Spouse states Blue Medicare termed pt because she did not pay premium when hospitalized.  He is appealing the termination and expects her insurance to be  reinstated.  10/10/13 [place] MSN BSN CCM Remains on CRRT, dobutamine gtt; twitching/myoclonus - Keppra per neuro.  10/08/13 1030 [place] MSN BSN CCM Continues on dobutamine gtt and CRRT.  Wound VAC to groin wound.  Pt was listed as [facility], now listed as self pay.  09-21-13 10:05am Luz Lex, [place] [phone] Back on CRRT and continues on pressors and amio.  on 2 liters [place].  09/17/13 [place] RN MSN BSN CCM Off CRRT, HD 5-6 x wk; CFA occlusion 2/2 IABP - to OR for ?thrombectomy.  Per spouse and mother, they will be able to provide 24/7 assistance to pt when medically stable.

## 2013-10-19 LAB — CARBOXYHEMOGLOBIN
Carboxyhemoglobin: 2.2 % — ABNORMAL HIGH (ref 0.5–1.5)
Methemoglobin: 0.9 % (ref 0.0–1.5)
O2 Saturation: 62.8 %
TOTAL HEMOGLOBIN: 7.7 g/dL — AB (ref 12.0–16.0)

## 2013-10-19 LAB — GLUCOSE, CAPILLARY
GLUCOSE-CAPILLARY: 114 mg/dL — AB (ref 70–99)
Glucose-Capillary: 165 mg/dL — ABNORMAL HIGH (ref 70–99)
Glucose-Capillary: 183 mg/dL — ABNORMAL HIGH (ref 70–99)

## 2013-10-19 LAB — RENAL FUNCTION PANEL
Albumin: 2.3 g/dL — ABNORMAL LOW (ref 3.5–5.2)
Anion gap: 15 (ref 5–15)
BUN: 49 mg/dL — ABNORMAL HIGH (ref 6–23)
CALCIUM: 8.1 mg/dL — AB (ref 8.4–10.5)
CO2: 25 meq/L (ref 19–32)
Chloride: 92 mEq/L — ABNORMAL LOW (ref 96–112)
Creatinine, Ser: 5.85 mg/dL — ABNORMAL HIGH (ref 0.50–1.10)
GFR calc non Af Amer: 8 mL/min — ABNORMAL LOW (ref >90)
GFR, EST AFRICAN AMERICAN: 9 mL/min — AB (ref >90)
GLUCOSE: 161 mg/dL — AB (ref 70–99)
Phosphorus: 6.2 mg/dL — ABNORMAL HIGH (ref 2.3–4.6)
Potassium: 4.8 mEq/L (ref 3.7–5.3)
Sodium: 132 mEq/L — ABNORMAL LOW (ref 137–147)

## 2013-10-19 LAB — CBC
HEMATOCRIT: 24.6 % — AB (ref 36.0–46.0)
Hemoglobin: 7.7 g/dL — ABNORMAL LOW (ref 12.0–15.0)
MCH: 28.7 pg (ref 26.0–34.0)
MCHC: 31.3 g/dL (ref 30.0–36.0)
MCV: 91.8 fL (ref 78.0–100.0)
Platelets: 302 10*3/uL (ref 150–400)
RBC: 2.68 MIL/uL — ABNORMAL LOW (ref 3.87–5.11)
RDW: 17.2 % — AB (ref 11.5–15.5)
WBC: 5.3 10*3/uL (ref 4.0–10.5)

## 2013-10-19 LAB — MAGNESIUM: MAGNESIUM: 2.5 mg/dL (ref 1.5–2.5)

## 2013-10-19 LAB — PROTIME-INR
INR: 3.33 — AB (ref 0.00–1.49)
PROTHROMBIN TIME: 33.8 s — AB (ref 11.6–15.2)

## 2013-10-19 MED ORDER — WARFARIN SODIUM 1 MG PO TABS
1.0000 mg | ORAL_TABLET | Freq: Once | ORAL | Status: AC
  Administered 2013-10-19: 1 mg via ORAL
  Filled 2013-10-19: qty 1

## 2013-10-19 MED ORDER — ANTICOAGULANT SODIUM CITRATE 4% (200MG/5ML) IV SOLN
5.0000 mL | Freq: Once | Status: AC
  Administered 2013-10-22: 5 mL via INTRAVENOUS
  Filled 2013-10-19 (×3): qty 250

## 2013-10-19 NOTE — Progress Notes (Signed)
ANTICOAGULATION CONSULT NOTE - Follow Up Consult  Pharmacy Consult for Coumadin Indication: Thromboembolic state (RUE DVT)  Allergies  Allergen Reactions  . Crestor [Rosuvastatin] Other (See Comments)    Severe muscle weakness  . Nsaids Other (See Comments)    Not allergic, "bad on my kidneys"  . 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 Ciprofloxacin Rash    Patient Measurements: Height: 5' 7.5" (171.5 cm) Weight: 275 lb 12.7 oz (125.1 kg) IBW/kg (Calculated) : 62.75  Vital Signs: Temp: 98 F (36.7 C) (07/03 0807) Temp src: Oral (07/03 0807) BP: 123/87 mmHg (07/03 0900) Pulse Rate: 130 (07/03 0900)  Labs:  Recent Labs  10/17/13 0425 10/18/13 0500 10/18/13 0510 10/19/13 0355  HGB 7.7*  --  7.6* 7.7*  HCT 25.1*  --  24.9* 24.6*  PLT 297  --  300 302  LABPROT 21.8*  --  26.8* 33.8*  INR 1.90*  --  2.48* 3.33*  CREATININE 5.07* 4.65*  --  5.85*    Estimated Creatinine Clearance: 16.8 ml/min (by C-G formula based on Cr of 5.85).  Assessment: 43yof continues on coumadin for RUE DVT. She has completed 5 days of overlap therapy with argatroban. Argatroban was stopped 6/25 due to therapeutic INR. INR (3.33) slightly higher than goal and rising quickly after 3 doses of Coumadin (resumed 6/30).   7 second jump in protime.   - H/H and Plts fairly stable - No significant bleeding reported  Goal of Therapy: INR 2-3 Monitor platelets by anticoagulation protocol: Yes   Plan:  1. Coumadin 1 mg x 1 tonight.  2. Follow-up AM INR 3. Coumadin education eventually once discharge plan more clear. Eudelia Bunch, Pharm.D. [phone] 10/19/2013 10:05 AM Coumadin

## 2013-10-19 NOTE — Progress Notes (Signed)
Patient ID: Hannah [name], female   DOB: [birth year], 43 y.o.   MRN: [id number]  ADVANCED HEART FAILURE ROUNDING NOTE   SUBJECTIVE  43 yo female with PMH of DM1, stage V kidney disease, blindness, h/o lung CA s/p resection who was recently discharged for heart failure symptom had a high risk stress test which showed ischemia in anterior and apical region. She underwent cath 09/04/2013 which showed chronically occluded RCA and triple vessel dx. CT surgery consulted but not felt to be surgical candidate due to poor PFTs, suboptimal targets, comorbidities and shock. EF 22% by Myoview. 30-35% by echo  Underwent placement of swan and IABP for shock (co-ox 38%). Unfortunately developed cold foot and IABP had to be removed.Underwent R femoral thrombectomy and 4 compartment fasciotomy of RLE on 6/1.On 6/3 had VT/VF->PEA arrest 10 mins CPR. U/s shows R axillary DVT  Now on intermittent HD. Dobutamine back down to 2.5. CO-OX good.  Weight up another 5 pounds. Denies SOB. Wounds healing very slowly.    Denies dyspnea or orthopnea. + myoclonus worse today   CURRENT MEDS . aspirin  81 mg Oral Daily  . atorvastatin  80 mg Oral q1800  . calcitRIOL  0.25 mcg Oral Daily  . darbepoetin (ARANESP) injection - NON-DIALYSIS  200 mcg Subcutaneous Q Wed-1800  . feeding supplement (NEPRO CARB STEADY)  237 mL Oral TID WC  . fludrocortisone  0.1 mg Oral Daily  . insulin aspart  0-15 Units Subcutaneous TID WC  . insulin aspart  0-5 Units Subcutaneous QHS  . insulin aspart  4 Units Subcutaneous TID WC  . insulin glargine  35 Units Subcutaneous Q24H  . levETIRAcetam  1,000 mg Oral BID  . lidocaine (PF)  5 mL Other Once  . midodrine  10 mg Oral TID WC  . pantoprazole  40 mg Oral Daily  . polyethylene glycol  17 g Oral Daily  . sodium chloride  10-40 mL Intracatheter Q12H  . Warfarin - Pharmacist Dosing Inpatient   Does not apply q1800    OBJECTIVE  Filed Vitals:   10/19/13 0700 10/19/13 0800 10/19/13 0807  10/19/13 0900  BP: 118/81 116/81  123/87  Pulse: 98 103  130  Temp:   98 F (36.7 C)   TempSrc:   Oral   Resp: 10 9  18   Height:      Weight:      SpO2: 98% 99%  99%    Intake/Output Summary (Last 24 hours) at 10/19/13 0924 Last data filed at 10/19/13 0900  Gross per 24 hour  Intake 1511.6 ml  Output     50 ml  Net 1461.6 ml   Filed Weights   10/17/13 1139 10/18/13 0500 10/19/13 0500  Weight: 122.8 kg (270 lb 11.6 oz) 122.9 kg (270 lb 15.1 oz) 125.1 kg (275 lb 12.7 oz)    PHYSICAL EXAM  CVP 14 General: Lying in bed.  NAD Neuro: Alert and oriented X 3. HEENT:  Normal  Neck: Supple without bruits. Chest L subclav PC. R subclav TLC. JVP up Lungs:  clear Heart: tachy regular.  Abdomen: Soft, non-tender, non-distended, BS + x 4.  Extremities: 2-3+ edema bilateral ankles. Wound vac in R groin and RLE  CBC  Recent Labs  10/18/13 0510 10/19/13 0355  WBC 5.5 5.3  HGB 7.6* 7.7*  HCT 24.9* 24.6*  MCV 92.6 91.8  PLT 300 350   Basic Metabolic Panel  Recent Labs  10/18/13 0500 10/18/13 0510 10/19/13 0355  NA 132*  --  132*  K 4.2  --  4.8  CL 93*  --  92*  CO2 26  --  25  GLUCOSE 132*  --  161*  BUN 36*  --  49*  CREATININE 4.65*  --  5.85*  CALCIUM 7.9*  --  8.1*  MG  --  2.3 2.5  PHOS 5.0*  --  6.2*   Liver Function Tests  Recent Labs  10/18/13 0500 10/19/13 0355  ALBUMIN 2.2* 2.3*   Cardiac Enzymes No results found for this basename: CKTOTAL, CKMB, CKMBINDEX, TROPONINI,  in the last 72 hours Thyroid Function Tests No results found for this basename: TSH, T4TOTAL, FREET3, T3FREE, THYROIDAB,  in the last 72 hours  TELE  Sinus tachycardia 100-110   Radiology/Studies  Dg Chest 2 View  09/06/2013   CLINICAL DATA:  CHF, intermittent dyspnea  EXAM: CHEST  2 VIEW  COMPARISON:  Prior chest x-ray 08/13/2013; prior chest CT 08/22/2013  FINDINGS: Stable cardiac and mediastinal contours. Atherosclerotic calcifications are present within the transverse  aorta. Moderate left and small right pleural effusions are similar compared to prior. There is persistent associated bibasilar atelectasis. Mild pulmonary vascular congestion without overt edema. No pneumothorax. No new focal airspace consolidation. No acute osseous abnormality.  IMPRESSION: 1. Stable moderate left and small right pleural effusions and associated bibasilar atelectasis. 2. Pulmonary vascular congestion without overt edema.   Electronically Signed   By: Jacqulynn Cadet M.D.   On: 09/06/2013 07:55   Ct Chest Wo Contrast  08/22/2013   CLINICAL DATA:  Left lung cancer status post lobectomy. Weight gain. Renal insufficiency.  EXAM: CT CHEST WITHOUT CONTRAST  TECHNIQUE: Multidetector CT imaging of the chest was performed following the standard protocol without IV contrast.  COMPARISON:  Radiographs 08/13/2013 and 08/04/2013.  CT 08/02/2012.  FINDINGS: There are stable postsurgical changes related to prior left lower lobe resection. There has been interval enlargement of several mediastinal lymph nodes. These include 12 mm right paratracheal (image 19), 13 mm precarinal (image 23), and 14 mm AP window (image 25) lymph nodes. Some of these nodes have retained fatty hila. Allowing for the limitations of noncontrast technique, the hila appear stable.  There is stable low-density within the right thyroid lobe. Atherosclerosis of the aorta, great vessels and coronary arteries is noted. The heart size is normal. There is no pericardial effusion.  Moderate size dependent pleural effusions are present bilaterally. There is associated dependent airspace disease in both lower lobes. In addition, there are more focal airspace opacities within the right lower lobe and inferior aspect of the left upper lobe. The latter is somewhat nodular, measuring up to 1.8 cm on image 43. No other focal nodularity or endobronchial lesions are demonstrated.  The visualized upper abdomen has a stable appearance. There is no adrenal  mass. There is increased subcutaneous edema throughout the subcutaneous fat, especially within the anterior aspect of the upper abdomen.  No worrisome osseous findings are demonstrated.  IMPRESSION: 1. As demonstrated radiographically, there are bilateral pleural effusions and bilateral airspace opacities which are new compared with the prior CT. Although there are focal somewhat nodular components in the right lower and left upper lobes, these findings are most likely infectious/inflammatory. 2. Progressive mediastinal lymphadenopathy. Some of the nodes have retained fatty hila and may be reactive. Metastatic disease cannot be completely excluded. 3. Stable atherosclerosis and low-density within the right thyroid lobe. 4. If the airspace opacities and pleural effusions fail to respond to appropriate clinical therapy, follow-up CT or  PET-CT may be warranted to exclude metastatic disease.   Electronically Signed   By: Camie Patience M.D.   On: 08/22/2013 10:26   Dg Chest Portable 1 View  08/13/2013   CLINICAL DATA:  Shortness of breath increasing for 2 weeks. History of CHF.  EXAM: PORTABLE CHEST - 1 VIEW  COMPARISON:  08/04/2013  FINDINGS: Again noted are bibasilar lung densities that are suggestive for pleural effusions and atelectasis. Heart size appears to be enlarged. There is some peribronchial thickening and cannot exclude pulmonary edema.  IMPRESSION: Bilateral pleural effusions with basilar atelectasis. There is mild pulmonary edema. Minimal change from the previous examination.   Electronically Signed   By: Markus Daft M.D.   On: 08/13/2013 23:10    ASSESSMENT AND PLAN 1. Cardiogenic shock 2. Acute on chronic systolic HF     EF ~16-60% 3. 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 Acute respiratory failure 5. 3-V CAD, severe.  6. Carotid stenosis     - doppler 08/31/2013 shows R 60-79% ICA stenosis, L 80-99% ICA stenosis     - vascular surgery aware 7. Lung CA  h/o LLL lobectomy (2 yr ago due to non-small cell  carcinoma)      - PFT 09/05/2013 pre-FEV1 28%, pre-FVC 34%, pre-FEV1/FVC ratio 68 8. DM1 9. Ischemic R foot s/p R femoral thrombectomy and 4-compartment fasciotomy.  10. VT/VF arrest 6/3 11. R axillary DVT 12. Severe protein calorie malnutrition  13. Large right groin wound s/p debridement 6/12 14. Anemia: Transfuse hemoglobin < 7.  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 Hopefully Renal can pull more. Will place fluid restriction. Will not wean dobutamine any more until more fluid gets off.   Wound care per VVS. She may need further debridement of groin wound.   Continue keppra for myoclonus  INR 3.3. Warfarin dosing d/w Pharmacy. Watch CBC as HGb drifting down slowly again.   Continue to manage HF and CAD medically.   Daniel Bensimhon,MD 9:24 AM

## 2013-10-19 NOTE — Progress Notes (Signed)
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 Developed cardiogenic shock, cold foot after IABP requiring femoral thrombectomy and fasciotomy (6/1), VT/VF then PEA arrest 6/3. She required initiation of RRT 09/06/13 and has remained dialysis dependent.  Short trial of IHD unsuccessful and back to CRRT as of 6/4 with  inotrope requirement and inability to wean. Pt transitioned to intermittent HD as of 6/29.Marland Kitchen    PT Comments    Pt not feeling as good today. Myoclonic jerks are present today but not as severe as previously.  Follow Up Recommendations  Home health PT;Supervision/Assistance - 24 hour     Equipment Recommendations  Rolling walker with 5" wheels    Recommendations for Other Services       Precautions / Restrictions Precautions Precautions: Fall Precaution Comments: Has had myoclonic jerks with buckling resulting in fall. Currently myoclonic jerks returning.    Mobility  Bed Mobility Overal bed mobility: Needs Assistance         Sit to supine: Min assist   General bed mobility comments: Assist to bring legs back up into bed  Transfers Overall transfer level: Needs assistance Equipment used:  Charlaine Dalton) Transfers: Sit to/from [employer] to Stand: +2 physical assistance;Mod assist Stand pivot transfers: +2 physical assistance;Min assist (with Stedy)       General transfer comment: Assist to bring hips up from low chair. Used Stedy to pivot chair to [facility] to bed due to return of myoclonic jerks.  Ambulation/Gait                 Stairs            Wheelchair Mobility    Modified Rankin (Stroke Patients Only)       Balance   Sitting-balance support: No upper extremity  supported Sitting balance-Leahy Scale: Fair     Standing balance support: Bilateral upper extremity supported Standing balance-Leahy Scale: Poor Standing balance comment: Stood with Stedy and min A                    Cognition Arousal/Alertness: Awake/alert Behavior During Therapy: WFL for tasks assessed/performed Overall Cognitive Status: Within Functional Limits for tasks assessed                      Exercises      General Comments        Pertinent Vitals/Pain See flow sheets.    Home Living                      Prior Function            PT Goals (current goals can now be found in the care plan section) Progress towards PT goals: Not progressing toward goals - comment (not feeling well today and nausea and myoclonic jerks.)    Frequency       PT Plan Current plan remains appropriate (will continue to monitor)    Co-evaluation             End of Session Equipment Utilized During Treatment: Other (comment) Charlaine Dalton) Activity Tolerance: Patient limited by fatigue;Other (comment) (nausea, not feeling well) Patient left: in bed;with call bell/phone within reach;with nursing/sitter  in room     Time: 1132-1200 PT Time Calculation (min): 28 min  Charges:  $Gait Training: 23-37 mins                    G Codes:      Dareld Mcauliffe 12-Nov-2013, 12:30 PM  [employer] PT [phone]

## 2013-10-19 NOTE — Progress Notes (Signed)
Patient ID: Hannah [name], female   DOB: 1971-04-05, 43 y.o.   MRN: [id number] S:feels well O:BP 123/87  Pulse 130  Temp(Src) 98 F (36.7 C) (Oral)  Resp 18  Ht 5' 7.5" (1.715 m)  Wt 125.1 kg (275 lb 12.7 oz)  BMI 42.53 kg/m2  SpO2 99%  LMP 08/19/2013  Intake/Output Summary (Last 24 hours) at 10/19/13 0942 Last data filed at 10/19/13 0900  Gross per 24 hour  Intake 1511.6 ml  Output     50 ml  Net 1461.6 ml   Intake/Output: I/O last 3 completed shifts: In: 1575.6 [P.O.:1400; I.V.:175.6] Out: 150 [Urine:100; Drains:50]  Intake/Output this shift:  Total I/O In: 308.8 [P.O.:300; I.V.:8.8] Out: -  Weight change: -0.5 kg (-1 lb 1.6 oz) Gen:WD WN WF in NAD CVS:no rub Resp:cta QQV:ZDGLOV Ext:+edema 1+, wound vac on right leg   Recent Labs Lab 10/13/13 0600 10/13/13 1515 10/14/13 0412 10/15/13 0346 10/16/13 0455 10/17/13 0425 10/18/13 0500 10/19/13 0355  NA  --  136* 137 134* 136* 131* 132* 132*  K  --  4.5 4.4 4.6 4.4 4.6 4.2 4.8  CL  --  98 99 97 96 92* 93* 92*  CO2  --  26 28 26 28 27 26 25   GLUCOSE  --  65* 46* 160* 142* 114* 132* 161*  BUN  --  24* 19 24* 27* 40* 36* 49*  CREATININE  --  2.22* 2.07* 3.01* 3.65* 5.07* 4.65* 5.85*  ALBUMIN 2.0* 2.0* 2.0* 1.9* 2.4* 2.3* 2.2* 2.3*  CALCIUM  --  8.1* 7.9* 7.9* 8.3* 8.4 7.9* 8.1*  PHOS  --  2.1* 2.2* 3.0 4.2 5.2* 5.0* 6.2*  AST 20  --   --   --   --   --   --   --   ALT 15  --   --   --   --   --   --   --    Liver Function Tests:  Recent Labs Lab 10/13/13 0600  10/17/13 0425 10/18/13 0500 10/19/13 0355  AST 20  --   --   --   --   ALT 15  --   --   --   --   ALKPHOS 138*  --   --   --   --   BILITOT 0.3  --   --   --   --   PROT 5.6*  --   --   --   --   ALBUMIN 2.0*  < > 2.3* 2.2* 2.3*  < > = values in this interval not displayed. No results found for this basename: LIPASE, AMYLASE,  in the last 168 hours No results found for this basename: AMMONIA,  in the last 168 hours CBC:  Recent Labs Lab  10/15/13 0346 10/16/13 0455 10/17/13 0425 10/18/13 0510 10/19/13 0355  WBC 5.4 5.6 5.8 5.5 5.3  HGB 7.7* 8.2* 7.7* 7.6* 7.7*  HCT 25.8* 27.4* 25.1* 24.9* 24.6*  MCV 95.9 96.8 93.3 92.6 91.8  PLT 264 286 297 300 302   Cardiac Enzymes: No results found for this basename: CKTOTAL, CKMB, CKMBINDEX, TROPONINI,  in the last 168 hours CBG:  Recent Labs Lab 10/18/13 0810 10/18/13 1229 10/18/13 1659 10/18/13 2143 10/19/13 0805  GLUCAP 128* 162* 143* 201* 165*    Iron Studies: No results found for this basename: IRON, TIBC, TRANSFERRIN, FERRITIN,  in the last 72 hours Studies/Results: No results found. Marland Kitchen aspirin  81 mg Oral Daily  .  atorvastatin  80 mg Oral q1800  . calcitRIOL  0.25 mcg Oral Daily  . darbepoetin (ARANESP) injection - NON-DIALYSIS  200 mcg Subcutaneous Q Wed-1800  . feeding supplement (NEPRO CARB STEADY)  237 mL Oral TID WC  . fludrocortisone  0.1 mg Oral Daily  . insulin aspart  0-15 Units Subcutaneous TID WC  . insulin aspart  0-5 Units Subcutaneous QHS  . insulin aspart  4 Units Subcutaneous TID WC  . insulin glargine  35 Units Subcutaneous Q24H  . levETIRAcetam  1,000 mg Oral BID  . lidocaine (PF)  5 mL Other Once  . midodrine  10 mg Oral TID WC  . pantoprazole  40 mg Oral Daily  . polyethylene glycol  17 g Oral Daily  . sodium chloride  10-40 mL Intracatheter Q12H  . Warfarin - Pharmacist Dosing Inpatient   Does not apply q1800    BMET    Component Value Date/Time   NA 132* 10/19/2013 0355   K 4.8 10/19/2013 0355   CL 92* 10/19/2013 0355   CO2 25 10/19/2013 0355   GLUCOSE 161* 10/19/2013 0355   BUN 49* 10/19/2013 0355   CREATININE 5.85* 10/19/2013 0355   CREATININE 4.17* 07/06/2013 1618   CALCIUM 8.1* 10/19/2013 0355   GFRNONAA 8* 10/19/2013 0355   GFRNONAA 71 07/13/2011 0835   GFRAA 9* 10/19/2013 0355   GFRAA 82 07/13/2011 0835   CBC    Component Value Date/Time   WBC 5.3 10/19/2013 0355   RBC 2.68* 10/19/2013 0355   HGB 7.7* 10/19/2013 0355   HCT 24.6* 10/19/2013  0355   PLT 302 10/19/2013 0355   MCV 91.8 10/19/2013 0355   MCH 28.7 10/19/2013 0355   MCHC 31.3 10/19/2013 0355   RDW 17.2* 10/19/2013 0355   LYMPHSABS 1.4 08/04/2013 1223   MONOABS 0.4 08/04/2013 1223   EOSABS 0.2 08/04/2013 1223   BASOSABS 0.1 08/04/2013 1223    Assessment/Plan:  1. ESRD- (dialysis dependent since 5/21 and off CVVHD 6/28 and first IHD 6/29) tolerated IHD well yesterday.  1. Has tolerated IHD well 2. Cont with HD qMWF and may do extra session on Sat for UF depending upon cardiac status. 2. Cardiogenic shock/ICMP with EF of 22% on dobutamine (now down to 2.5 mcg/kg/min, down from 6), midodrine, and florinef.  3. 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 Should repeat panel: ordered 10/13/13 and resulted today as negative so should be able to use heparin with HD and will follow platelets. 5. Right axillary DVT 6. Bilateral carotid stenosis 7. DM- on insulin 8. S/p right femoral endarterectomy with wound deehiscence and now on vac (episode of vascular insufficiency with cold foot after IABP) 9. 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 VT/VF then PEA arrest 6/3. [place]

## 2013-10-20 LAB — GLUCOSE, CAPILLARY
GLUCOSE-CAPILLARY: 168 mg/dL — AB (ref 70–99)
GLUCOSE-CAPILLARY: 102 mg/dL — AB (ref 70–99)
GLUCOSE-CAPILLARY: 112 mg/dL — AB (ref 70–99)
Glucose-Capillary: 143 mg/dL — ABNORMAL HIGH (ref 70–99)

## 2013-10-20 LAB — CBC
HCT: 24.6 % — ABNORMAL LOW (ref 36.0–46.0)
Hemoglobin: 7.7 g/dL — ABNORMAL LOW (ref 12.0–15.0)
MCH: 28.4 pg (ref 26.0–34.0)
MCHC: 31.3 g/dL (ref 30.0–36.0)
MCV: 90.8 fL (ref 78.0–100.0)
PLATELETS: 285 10*3/uL (ref 150–400)
RBC: 2.71 MIL/uL — AB (ref 3.87–5.11)
RDW: 17.1 % — ABNORMAL HIGH (ref 11.5–15.5)
WBC: 5.1 10*3/uL (ref 4.0–10.5)

## 2013-10-20 LAB — RENAL FUNCTION PANEL
ALBUMIN: 2.2 g/dL — AB (ref 3.5–5.2)
Anion gap: 13 (ref 5–15)
BUN: 33 mg/dL — ABNORMAL HIGH (ref 6–23)
CALCIUM: 7.9 mg/dL — AB (ref 8.4–10.5)
CO2: 25 mEq/L (ref 19–32)
Chloride: 96 mEq/L (ref 96–112)
Creatinine, Ser: 4.47 mg/dL — ABNORMAL HIGH (ref 0.50–1.10)
GFR calc Af Amer: 13 mL/min — ABNORMAL LOW (ref >90)
GFR, EST NON AFRICAN AMERICAN: 11 mL/min — AB (ref >90)
Glucose, Bld: 125 mg/dL — ABNORMAL HIGH (ref 70–99)
Phosphorus: 4.4 mg/dL (ref 2.3–4.6)
Potassium: 4.3 mEq/L (ref 3.7–5.3)
Sodium: 134 mEq/L — ABNORMAL LOW (ref 137–147)

## 2013-10-20 LAB — CARBOXYHEMOGLOBIN
CARBOXYHEMOGLOBIN: 1.9 % — AB (ref 0.5–1.5)
METHEMOGLOBIN: 0.8 % (ref 0.0–1.5)
O2 SAT: 56 %
TOTAL HEMOGLOBIN: 7.7 g/dL — AB (ref 12.0–16.0)

## 2013-10-20 LAB — PROTIME-INR
INR: 3.68 — ABNORMAL HIGH (ref 0.00–1.49)
Prothrombin Time: 36.5 seconds — ABNORMAL HIGH (ref 11.6–15.2)

## 2013-10-20 LAB — MAGNESIUM: MAGNESIUM: 2.1 mg/dL (ref 1.5–2.5)

## 2013-10-20 MED ORDER — ALTEPLASE 100 MG IV SOLR
2.0000 mg | Freq: Once | INTRAVENOUS | Status: DC
  Filled 2013-10-20 (×2): qty 2

## 2013-10-20 MED ORDER — FENTANYL CITRATE 0.05 MG/ML IJ SOLN
INTRAMUSCULAR | Status: AC
  Filled 2013-10-20: qty 2

## 2013-10-20 MED ORDER — HEPARIN SODIUM (PORCINE) 1000 UNIT/ML DIALYSIS: 20.0000 [IU]/kg | INTRAMUSCULAR | Status: DC | PRN

## 2013-10-20 NOTE — Progress Notes (Signed)
ANTICOAGULATION CONSULT NOTE - Follow Up Consult  Pharmacy Consult for Coumadin Indication: Thromboembolic state (RUE DVT)  Allergies  Allergen Reactions  . Crestor [Rosuvastatin] Other (See Comments)    Severe muscle weakness  . Nsaids Other (See Comments)    Not allergic, "bad on my kidneys"  . 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 Ciprofloxacin Rash    Patient Measurements: Height: 5' 7.5" (171.5 cm) Weight: 269 lb 2.9 oz (122.1 kg) IBW/kg (Calculated) : 62.75  Vital Signs: Temp: 98 F (36.7 C) (07/04 1217) Temp src: Oral (07/04 1217) BP: 108/88 mmHg (07/04 1200) Pulse Rate: 102 (07/04 1200)  Labs:  Recent Labs  10/18/13 0500  10/18/13 0510 10/19/13 0355 10/20/13 0440  HGB  --   < > 7.6* 7.7* 7.7*  HCT  --   --  24.9* 24.6* 24.6*  PLT  --   --  300 302 285  LABPROT  --   --  26.8* 33.8* 36.5*  INR  --   --  2.48* 3.33* 3.68*  CREATININE 4.65*  --   --  5.85* 4.47*  < > = values in this interval not displayed.  Estimated Creatinine Clearance: 21.7 ml/min (by C-G formula based on Cr of 4.47).  Assessment: 43yof continues on coumadin for RUE DVT. She has completed 5 days of overlap therapy with argatroban. Argatroban was stopped 6/25 due to therapeutic INR. I  INR today remains SUPRAtherapeutic despite a low dose of warfarin given yesterday evening (INR 3.68 << 3.33, goal of 2-3). Hgb/Hct low but stable, plts 285 << 302. No overt s/sx of bleeding noted. Will hold warfarin today.  Goal of Therapy: INR 2-3 Monitor platelets by anticoagulation protocol: Yes   Plan:  1. Hold warfarin today  2. Follow-up AM INR 3. Coumadin education eventually once discharge plan more clear.  Alycia Rossetti, PharmD, BCPS Clinical Pharmacist Pager: [phone] 10/20/2013 12:30 PM

## 2013-10-20 NOTE — Progress Notes (Signed)
Patient ID: SYESHA THAW, female   DOB: [birth year], 43 y.o.   MRN: [id number] S:feels well O:BP 121/61  Pulse 100  Temp(Src) 97.6 F (36.4 C) (Oral)  Resp 11  Ht 5' 7.5" (1.715 m)  Wt 122.1 kg (269 lb 2.9 oz)  BMI 41.51 kg/m2  SpO2 87%  LMP 08/19/2013  Intake/Output Summary (Last 24 hours) at 10/20/13 1118 Last data filed at 10/20/13 1000  Gross per 24 hour  Intake  971.4 ml  Output   3100 ml  Net -2128.6 ml   Intake/Output: I/O last 3 completed shifts: In: 1141.3 [P.O.:990; I.V.:151.3] Out: 3100 [Drains:100; Other:3000]  Intake/Output this shift:  Total I/O In: 250.5 [P.O.:240; I.V.:10.5] Out: -  Weight change: 0.9 kg (1 lb 15.7 oz) Gen:WD obese WF in NAd CVS:no rub Resp:cta HMC:NOBSJG Ext:+edema   Recent Labs Lab 10/14/13 0412 10/15/13 0346 10/16/13 0455 10/17/13 0425 10/18/13 0500 10/19/13 0355 10/20/13 0440  NA 137 134* 136* 131* 132* 132* 134*  K 4.4 4.6 4.4 4.6 4.2 4.8 4.3  CL 99 97 96 92* 93* 92* 96  CO2 28 26 28 27 26 25 25   GLUCOSE 46* 160* 142* 114* 132* 161* 125*  BUN 19 24* 27* 40* 36* 49* 33*  CREATININE 2.07* 3.01* 3.65* 5.07* 4.65* 5.85* 4.47*  ALBUMIN 2.0* 1.9* 2.4* 2.3* 2.2* 2.3* 2.2*  CALCIUM 7.9* 7.9* 8.3* 8.4 7.9* 8.1* 7.9*  PHOS 2.2* 3.0 4.2 5.2* 5.0* 6.2* 4.4   Liver Function Tests:  Recent Labs Lab 10/18/13 0500 10/19/13 0355 10/20/13 0440  ALBUMIN 2.2* 2.3* 2.2*   No results found for this basename: LIPASE, AMYLASE,  in the last 168 hours No results found for this basename: AMMONIA,  in the last 168 hours CBC:  Recent Labs Lab 10/16/13 0455 10/17/13 0425 10/18/13 0510 10/19/13 0355 10/20/13 0440  WBC 5.6 5.8 5.5 5.3 5.1  HGB 8.2* 7.7* 7.6* 7.7* 7.7*  HCT 27.4* 25.1* 24.9* 24.6* 24.6*  MCV 96.8 93.3 92.6 91.8 90.8  PLT 286 297 300 302 285   Cardiac Enzymes: No results found for this basename: CKTOTAL, CKMB, CKMBINDEX, TROPONINI,  in the last 168 hours CBG:  Recent Labs Lab 10/19/13 0805 10/19/13 1206  10/19/13 1657 10/19/13 2153 10/20/13 0742  GLUCAP 165* 183* 114* 168* 102*    Iron Studies: No results found for this basename: IRON, TIBC, TRANSFERRIN, FERRITIN,  in the last 72 hours Studies/Results: No results found. Marland Kitchen anticoagulant sodium citrate  5 mL Intravenous Once  . aspirin  81 mg Oral Daily  . atorvastatin  80 mg Oral q1800  . calcitRIOL  0.25 mcg Oral Daily  . darbepoetin (ARANESP) injection - NON-DIALYSIS  200 mcg Subcutaneous Q Wed-1800  . feeding supplement (NEPRO CARB STEADY)  237 mL Oral TID WC  . fludrocortisone  0.1 mg Oral Daily  . insulin aspart  0-15 Units Subcutaneous TID WC  . insulin aspart  0-5 Units Subcutaneous QHS  . insulin aspart  4 Units Subcutaneous TID WC  . insulin glargine  35 Units Subcutaneous Q24H  . levETIRAcetam  1,000 mg Oral BID  . lidocaine (PF)  5 mL Other Once  . midodrine  10 mg Oral TID WC  . pantoprazole  40 mg Oral Daily  . polyethylene glycol  17 g Oral Daily  . sodium chloride  10-40 mL Intracatheter Q12H  . Warfarin - Pharmacist Dosing Inpatient   Does not apply q1800    BMET    Component Value Date/Time   NA 134*  10/20/2013 0440   K 4.3 10/20/2013 0440   CL 96 10/20/2013 0440   CO2 25 10/20/2013 0440   GLUCOSE 125* 10/20/2013 0440   BUN 33* 10/20/2013 0440   CREATININE 4.47* 10/20/2013 0440   CREATININE 4.17* 07/06/2013 1618   CALCIUM 7.9* 10/20/2013 0440   GFRNONAA 11* 10/20/2013 0440   GFRNONAA 71 07/13/2011 0835   GFRAA 13* 10/20/2013 0440   GFRAA 82 07/13/2011 0835   CBC    Component Value Date/Time   WBC 5.1 10/20/2013 0440   RBC 2.71* 10/20/2013 0440   HGB 7.7* 10/20/2013 0440   HCT 24.6* 10/20/2013 0440   PLT 285 10/20/2013 0440   MCV 90.8 10/20/2013 0440   MCH 28.4 10/20/2013 0440   MCHC 31.3 10/20/2013 0440   RDW 17.1* 10/20/2013 0440   LYMPHSABS 1.4 08/04/2013 1223   MONOABS 0.4 08/04/2013 1223   EOSABS 0.2 08/04/2013 1223   BASOSABS 0.1 08/04/2013 1223     Assessment/Plan:  1. ESRD- (dialysis dependent since 5/21 and off CVVHD  6/28 and first IHD 6/29) tolerated IHD well yesterday.  1. Has tolerated IHD well and tolerating UF 2. Cont with HD qMWF and will do extra session on today for UF. 2. Cardiogenic shock/ICMP with EF of 22% on dobutamine (now down to 2.5 mcg/kg/min, down from 6), midodrine, and florinef.  1. Weight continues to decrease with UF. 3. 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 Should repeat panel: ordered 10/13/13 and resulted today as negative so should be able to use heparin with HD and will follow platelets. 5. Right axillary DVT 6. Bilateral carotid stenosis 7. Vascualr access- has TDC, AVF/AVG on hold given cardiac issues 8. DM- on insulin 9. S/p right femoral endarterectomy with wound deehiscence and now on vac (episode of vascular insufficiency with cold foot after IABP) 10. 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 VT/VF then PEA arrest 6/3. [place]

## 2013-10-20 NOTE — Progress Notes (Signed)
ANTICOAGULATION CONSULT NOTE - Follow Up Consult  Pharmacy Consult for Coumadin Indication: Thromboembolic state (RUE DVT)  Allergies  Allergen Reactions  . Crestor [Rosuvastatin] Other (See Comments)    Severe muscle weakness  . Nsaids Other (See Comments)    Not allergic, "bad on my kidneys"  . 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 Ciprofloxacin Rash    Patient Measurements: Height: 5' 7.5" (171.5 cm) Weight: 269 lb 2.9 oz (122.1 kg) IBW/kg (Calculated) : 62.75 Heparin Dosing Weight:   Vital Signs: Temp: 98 F (36.7 C) (07/04 1217) Temp src: Oral (07/04 1217) BP: 108/88 mmHg (07/04 1200) Pulse Rate: 102 (07/04 1200)  Labs:  Recent Labs  10/18/13 0500  10/18/13 0510 10/19/13 0355 10/20/13 0440  HGB  --   < > 7.6* 7.7* 7.7*  HCT  --   --  24.9* 24.6* 24.6*  PLT  --   --  300 302 285  LABPROT  --   --  26.8* 33.8* 36.5*  INR  --   --  2.48* 3.33* 3.68*  CREATININE 4.65*  --   --  5.85* 4.47*  < > = values in this interval not displayed.  Estimated Creatinine Clearance: 21.7 ml/min (by C-G formula based on Cr of 4.47).   Medications:  Scheduled:  . anticoagulant sodium citrate  5 mL Intravenous Once  . aspirin  81 mg Oral Daily  . atorvastatin  80 mg Oral q1800  . calcitRIOL  0.25 mcg Oral Daily  . darbepoetin (ARANESP) injection - NON-DIALYSIS  200 mcg Subcutaneous Q Wed-1800  . feeding supplement (NEPRO CARB STEADY)  237 mL Oral TID WC  . fludrocortisone  0.1 mg Oral Daily  . insulin aspart  0-15 Units Subcutaneous TID WC  . insulin aspart  0-5 Units Subcutaneous QHS  . insulin aspart  4 Units Subcutaneous TID WC  . insulin glargine  35 Units Subcutaneous Q24H  . levETIRAcetam  1,000 mg Oral BID  . lidocaine (PF)  5 mL Other Once  . midodrine  10 mg Oral TID WC  . pantoprazole  40 mg Oral Daily  . polyethylene glycol  17 g Oral Daily  . sodium chloride  10-40 mL Intracatheter Q12H  . Warfarin - Pharmacist Dosing Inpatient   Does not apply q1800     Assessment: Pt remains on coumadin for RUE DVT. INR has tended to jump up quickly. Goal INR is 2-3 but today's INR is 3.65. Will give a very small coumadin dose today to give INR a chance to get back in desired range.  Goal of Therapy:  INR 2-3 Monitor platelets by anticoagulation protocol: Yes   Plan:  Will give a very small dose of coumadin today to give the INR a chance to drop back into the desired range. Will f/u INR in AM.  Minta Balsam 10/20/2013,12:27 PM

## 2013-10-20 NOTE — Progress Notes (Signed)
Patient ID: Hannah [name], female   DOB: 01/04/1971, 43 y.o.   MRN: [id number]  ADVANCED HEART FAILURE ROUNDING NOTE   SUBJECTIVE  43 yo female with PMH of DM1, stage V kidney disease, blindness, h/o lung CA s/p resection who was recently discharged for heart failure symptom had a high risk stress test which showed ischemia in anterior and apical region. She underwent cath 09/04/2013 which showed chronically occluded RCA and triple vessel dx. CT surgery consulted but not felt to be surgical candidate due to poor PFTs, suboptimal targets, comorbidities and shock. EF 22% by Myoview. 30-35% by echo  Underwent placement of swan and IABP for shock (co-ox 38%). Unfortunately developed cold foot and IABP had to be removed.Underwent R femoral thrombectomy and 4 compartment fasciotomy of RLE on 6/1.On 6/3 had VT/VF->PEA arrest 10 mins CPR. U/s shows R axillary DVT  Now on intermittent HD. Dobutamine back down to 2.5. CO-OX good on 7/3. Underwent HD with 3L off yesterday  Denies dyspnea or orthopnea.    CURRENT MEDS . anticoagulant sodium citrate  5 mL Intravenous Once  . aspirin  81 mg Oral Daily  . atorvastatin  80 mg Oral q1800  . calcitRIOL  0.25 mcg Oral Daily  . darbepoetin (ARANESP) injection - NON-DIALYSIS  200 mcg Subcutaneous Q Wed-1800  . feeding supplement (NEPRO CARB STEADY)  237 mL Oral TID WC  . fludrocortisone  0.1 mg Oral Daily  . insulin aspart  0-15 Units Subcutaneous TID WC  . insulin aspart  0-5 Units Subcutaneous QHS  . insulin aspart  4 Units Subcutaneous TID WC  . insulin glargine  35 Units Subcutaneous Q24H  . levETIRAcetam  1,000 mg Oral BID  . lidocaine (PF)  5 mL Other Once  . midodrine  10 mg Oral TID WC  . pantoprazole  40 mg Oral Daily  . polyethylene glycol  17 g Oral Daily  . sodium chloride  10-40 mL Intracatheter Q12H  . Warfarin - Pharmacist Dosing Inpatient   Does not apply q1800    OBJECTIVE  Filed Vitals:   10/20/13 0100 10/20/13 0200 10/20/13 0300  10/20/13 0400  BP: 107/85 113/66 103/62 102/64  Pulse: 102 103 105 103  Temp:      TempSrc:      Resp: 10 20 22 10   Height:      Weight:      SpO2: 97% 98% 96% 100%    Intake/Output Summary (Last 24 hours) at 10/20/13 0444 Last data filed at 10/20/13 0400  Gross per 24 hour  Intake 1041.2 ml  Output   3100 ml  Net -2058.8 ml   Filed Weights   10/19/13 0500 10/19/13 1249 10/19/13 1650  Weight: 125.1 kg (275 lb 12.7 oz) 126 kg (277 lb 12.5 oz) 123 kg (271 lb 2.7 oz)    PHYSICAL EXAM  CVP 14 General: Lying in bed.  NAD Neuro: Alert and oriented X 3. HEENT:  Normal  Neck: Supple without bruits. Chest L subclav PC. R subclav TLC. JVP up Lungs:  clear Heart: tachy regular.  Abdomen: Soft, non-tender, non-distended, BS + x 4.  Extremities: 2+ edema bilateral ankles. Wound vac in R groin and RLE  CBC  Recent Labs  10/18/13 0510 10/19/13 0355  WBC 5.5 5.3  HGB 7.6* 7.7*  HCT 24.9* 24.6*  MCV 92.6 91.8  PLT 300 622   Basic Metabolic Panel  Recent Labs  10/18/13 0500 10/18/13 0510 10/19/13 0355  NA 132*  --  132*  K 4.2  --  4.8  CL 93*  --  92*  CO2 26  --  25  GLUCOSE 132*  --  161*  BUN 36*  --  49*  CREATININE 4.65*  --  5.85*  CALCIUM 7.9*  --  8.1*  MG  --  2.3 2.5  PHOS 5.0*  --  6.2*   Liver Function Tests  Recent Labs  10/18/13 0500 10/19/13 0355  ALBUMIN 2.2* 2.3*   Cardiac Enzymes No results found for this basename: CKTOTAL, CKMB, CKMBINDEX, TROPONINI,  in the last 72 hours Thyroid Function Tests No results found for this basename: TSH, T4TOTAL, FREET3, T3FREE, THYROIDAB,  in the last 72 hours  TELE  Sinus tachycardia 100-110   Radiology/Studies  Dg Chest 2 View  09/06/2013   CLINICAL DATA:  CHF, intermittent dyspnea  EXAM: CHEST  2 VIEW  COMPARISON:  Prior chest x-ray 08/13/2013; prior chest CT 08/22/2013  FINDINGS: Stable cardiac and mediastinal contours. Atherosclerotic calcifications are present within the transverse aorta.  Moderate left and small right pleural effusions are similar compared to prior. There is persistent associated bibasilar atelectasis. Mild pulmonary vascular congestion without overt edema. No pneumothorax. No new focal airspace consolidation. No acute osseous abnormality.  IMPRESSION: 1. Stable moderate left and small right pleural effusions and associated bibasilar atelectasis. 2. Pulmonary vascular congestion without overt edema.   Electronically Signed   By: Jacqulynn Cadet M.D.   On: 09/06/2013 07:55   Ct Chest Wo Contrast  08/22/2013   CLINICAL DATA:  Left lung cancer status post lobectomy. Weight gain. Renal insufficiency.  EXAM: CT CHEST WITHOUT CONTRAST  TECHNIQUE: Multidetector CT imaging of the chest was performed following the standard protocol without IV contrast.  COMPARISON:  Radiographs 08/13/2013 and 08/04/2013.  CT 08/02/2012.  FINDINGS: There are stable postsurgical changes related to prior left lower lobe resection. There has been interval enlargement of several mediastinal lymph nodes. These include 12 mm right paratracheal (image 19), 13 mm precarinal (image 23), and 14 mm AP window (image 25) lymph nodes. Some of these nodes have retained fatty hila. Allowing for the limitations of noncontrast technique, the hila appear stable.  There is stable low-density within the right thyroid lobe. Atherosclerosis of the aorta, great vessels and coronary arteries is noted. The heart size is normal. There is no pericardial effusion.  Moderate size dependent pleural effusions are present bilaterally. There is associated dependent airspace disease in both lower lobes. In addition, there are more focal airspace opacities within the right lower lobe and inferior aspect of the left upper lobe. The latter is somewhat nodular, measuring up to 1.8 cm on image 43. No other focal nodularity or endobronchial lesions are demonstrated.  The visualized upper abdomen has a stable appearance. There is no adrenal mass.  There is increased subcutaneous edema throughout the subcutaneous fat, especially within the anterior aspect of the upper abdomen.  No worrisome osseous findings are demonstrated.  IMPRESSION: 1. As demonstrated radiographically, there are bilateral pleural effusions and bilateral airspace opacities which are new compared with the prior CT. Although there are focal somewhat nodular components in the right lower and left upper lobes, these findings are most likely infectious/inflammatory. 2. Progressive mediastinal lymphadenopathy. Some of the nodes have retained fatty hila and may be reactive. Metastatic disease cannot be completely excluded. 3. Stable atherosclerosis and low-density within the right thyroid lobe. 4. If the airspace opacities and pleural effusions fail to respond to appropriate clinical therapy, follow-up CT or PET-CT may  be warranted to exclude metastatic disease.   Electronically Signed   By: Camie Patience M.D.   On: 08/22/2013 10:26   Dg Chest Portable 1 View  08/13/2013   CLINICAL DATA:  Shortness of breath increasing for 2 weeks. History of CHF.  EXAM: PORTABLE CHEST - 1 VIEW  COMPARISON:  08/04/2013  FINDINGS: Again noted are bibasilar lung densities that are suggestive for pleural effusions and atelectasis. Heart size appears to be enlarged. There is some peribronchial thickening and cannot exclude pulmonary edema.  IMPRESSION: Bilateral pleural effusions with basilar atelectasis. There is mild pulmonary edema. Minimal change from the previous examination.   Electronically Signed   By: Markus Daft M.D.   On: 08/13/2013 23:10    ASSESSMENT AND PLAN 1. Cardiogenic shock 2. Acute on chronic systolic HF     EF ~46-28% 3. 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 Acute respiratory failure 5. 3-V CAD, severe.  6. Carotid stenosis     - doppler 08/31/2013 shows R 60-79% ICA stenosis, L 80-99% ICA stenosis     - vascular surgery aware 7. Lung CA  h/o LLL lobectomy (2 yr ago due to non-small cell carcinoma)       - PFT 09/05/2013 pre-FEV1 28%, pre-FVC 34%, pre-FEV1/FVC ratio 68 8. DM1 9. Ischemic R foot s/p R femoral thrombectomy and 4-compartment fasciotomy.  10. VT/VF arrest 6/3 11. R axillary DVT 12. Severe protein calorie malnutrition  13. Large right groin wound s/p debridement 6/12 14. Anemia: Transfuse hemoglobin < 7.  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 Hopefully Renal can pull more.  Will wean dobutamine to 2 mcg/kg/min  Wound care per [facility]. She may need further debridement of groin wound.   Continue keppra for myoclonus  Warfarin dosing d/w Pharmacy. Watch CBC as HGb drifting down slowly again.   Continue to manage HF and CAD medically.   Wynne Rozak,MD 4:44 AM

## 2013-10-20 NOTE — Progress Notes (Signed)
Pt ran 1hr of a 3 hour Tx. Catheter running very sluggish. Tx stopped and cath TPA'ed for 1hr 58min. TPA removed with much difficulty pullling and pushing in the venous port. Not able to restart Tx due to venous port having poor flow. Dr. Jonnie Finner called. Catheter to be TPA'ed overnight per Dr. Jonnie Finner.

## 2013-10-21 LAB — CBC
HCT: 25 % — ABNORMAL LOW (ref 36.0–46.0)
Hemoglobin: 7.7 g/dL — ABNORMAL LOW (ref 12.0–15.0)
MCH: 28 pg (ref 26.0–34.0)
MCHC: 30.8 g/dL (ref 30.0–36.0)
MCV: 90.9 fL (ref 78.0–100.0)
PLATELETS: 308 10*3/uL (ref 150–400)
RBC: 2.75 MIL/uL — ABNORMAL LOW (ref 3.87–5.11)
RDW: 17.2 % — ABNORMAL HIGH (ref 11.5–15.5)
WBC: 5.2 10*3/uL (ref 4.0–10.5)

## 2013-10-21 LAB — GLUCOSE, CAPILLARY
GLUCOSE-CAPILLARY: 67 mg/dL — AB (ref 70–99)
GLUCOSE-CAPILLARY: 97 mg/dL (ref 70–99)
GLUCOSE-CAPILLARY: 101 mg/dL — AB (ref 70–99)
Glucose-Capillary: 101 mg/dL — ABNORMAL HIGH (ref 70–99)

## 2013-10-21 LAB — CARBOXYHEMOGLOBIN
CARBOXYHEMOGLOBIN: 2.1 % — AB (ref 0.5–1.5)
Methemoglobin: 0.9 % (ref 0.0–1.5)
O2 Saturation: 59.8 %
TOTAL HEMOGLOBIN: 8 g/dL — AB (ref 12.0–16.0)

## 2013-10-21 LAB — RENAL FUNCTION PANEL
Albumin: 2.3 g/dL — ABNORMAL LOW (ref 3.5–5.2)
Anion gap: 14 (ref 5–15)
BUN: 42 mg/dL — ABNORMAL HIGH (ref 6–23)
CO2: 24 meq/L (ref 19–32)
CREATININE: 5.85 mg/dL — AB (ref 0.50–1.10)
Calcium: 8.3 mg/dL — ABNORMAL LOW (ref 8.4–10.5)
Chloride: 92 mEq/L — ABNORMAL LOW (ref 96–112)
GFR calc Af Amer: 9 mL/min — ABNORMAL LOW (ref >90)
GFR calc non Af Amer: 8 mL/min — ABNORMAL LOW (ref >90)
Glucose, Bld: 137 mg/dL — ABNORMAL HIGH (ref 70–99)
PHOSPHORUS: 5.4 mg/dL — AB (ref 2.3–4.6)
Potassium: 5 mEq/L (ref 3.7–5.3)
Sodium: 130 mEq/L — ABNORMAL LOW (ref 137–147)

## 2013-10-21 LAB — MAGNESIUM: MAGNESIUM: 2.2 mg/dL (ref 1.5–2.5)

## 2013-10-21 LAB — PROTIME-INR
INR: 3.38 — ABNORMAL HIGH (ref 0.00–1.49)
Prothrombin Time: 34.2 seconds — ABNORMAL HIGH (ref 11.6–15.2)

## 2013-10-21 MED ORDER — FENTANYL CITRATE 0.05 MG/ML IJ SOLN
INTRAMUSCULAR | Status: AC
  Filled 2013-10-21: qty 2

## 2013-10-21 MED ORDER — INSULIN GLARGINE 100 UNIT/ML ~~LOC~~ SOLN
20.0000 [IU] | Freq: Every day | SUBCUTANEOUS | Status: DC
  Administered 2013-10-21 – 2013-10-28 (×8): 20 [IU] via SUBCUTANEOUS
  Filled 2013-10-21 (×9): qty 0.2

## 2013-10-21 NOTE — Progress Notes (Signed)
ANTICOAGULATION CONSULT NOTE - Follow Up Consult  Pharmacy Consult for Coumadin Indication: Thromboembolic state (RUE DVT)  Allergies  Allergen Reactions  . Crestor [Rosuvastatin] Other (See Comments)    Severe muscle weakness  . Nsaids Other (See Comments)    Not allergic, "bad on my kidneys"  . 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 Ciprofloxacin Rash    Patient Measurements: Height: 5' 7.5" (171.5 cm) Weight: 269 lb 10 oz (122.3 kg) IBW/kg (Calculated) : 62.75 Heparin Dosing Weight:   Vital Signs: Temp: 98 F (36.7 C) (07/05 0915) Temp src: Oral (07/05 0915) BP: 126/60 mmHg (07/05 1300) Pulse Rate: 92 (07/05 1300)  Labs:  Recent Labs  10/19/13 0355 10/20/13 0440 10/21/13 0425  HGB 7.7* 7.7* 7.7*  HCT 24.6* 24.6* 25.0*  PLT 302 285 308  LABPROT 33.8* 36.5* 34.2*  INR 3.33* 3.68* 3.38*  CREATININE 5.85* 4.47* 5.85*    Estimated Creatinine Clearance: 16.6 ml/min (by C-G formula based on Cr of 5.85).   Medications:  Scheduled:  . alteplase  2 mg Intracatheter Once  . anticoagulant sodium citrate  5 mL Intravenous Once  . aspirin  81 mg Oral Daily  . atorvastatin  80 mg Oral q1800  . calcitRIOL  0.25 mcg Oral Daily  . darbepoetin (ARANESP) injection - NON-DIALYSIS  200 mcg Subcutaneous Q Wed-1800  . feeding supplement (NEPRO CARB STEADY)  237 mL Oral TID WC  . fludrocortisone  0.1 mg Oral Daily  . insulin aspart  0-15 Units Subcutaneous TID WC  . insulin aspart  0-5 Units Subcutaneous QHS  . insulin aspart  4 Units Subcutaneous TID WC  . insulin glargine  35 Units Subcutaneous Q24H  . levETIRAcetam  1,000 mg Oral BID  . lidocaine (PF)  5 mL Other Once  . midodrine  10 mg Oral TID WC  . pantoprazole  40 mg Oral Daily  . polyethylene glycol  17 g Oral Daily  . sodium chloride  10-40 mL Intracatheter Q12H  . Warfarin - Pharmacist Dosing Inpatient   Does not apply q1800    Assessment: Pt appears to be sensitive to coumadin. INR remains at 3.38 (Goal 2-3)  so will continue to hold it one more day.  Goal of Therapy:  INR 2-3 Monitor platelets by anticoagulation protocol: Yes   Plan:  No coumadin today. Continue following daily INR.  Minta Balsam 10/21/2013,1:56 PM

## 2013-10-21 NOTE — Procedures (Signed)
Patient was seen on dialysis and the procedure was supervised. BFR 400 Via [facility] TDC BP is 121/66.  Patient appears to be tolerating treatment well.  She is running today because of a poorly running catheter.  Her repeat HIT panel was negative so we can restart heparin with HD and follow plt count.  Assessment/Plan:  1. ESRD- (dialysis dependent since 5/21 and off CVVHD 6/28 and first IHD 6/29) tolerated IHD well yesterday.  1. Has tolerated IHD well and tolerating UF 2. Cont with HD qMWF and will do extra session today for UF. 2. Cardiogenic shock/ICMP with EF of 22% on dobutamine (now down to 2.5 mcg/kg/min, down from 6), midodrine, and florinef.  1. Weight continues to decrease with UF. 3. Anemia of chronic disease- on aranesp 277mcg weekly 4. HIT- on argatroban. Should repeat panel: ordered 10/13/13 and resulted today as negative so should be able to use heparin with HD and will follow platelets. 5. Right axillary DVT 6. Bilateral carotid stenosis 7. Vascualr access- has TDC, AVF/AVG on hold given cardiac issues 8. DM- on insulin 9. S/p right femoral endarterectomy with wound deehiscence and now on vac (episode of vascular insufficiency with cold foot after IABP) 10. 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 VT/VF then PEA arrest 6/3. 12.

## 2013-10-21 NOTE — Progress Notes (Signed)
1630 CBG 67 Grape juice given 1705 CBG 97

## 2013-10-21 NOTE — Progress Notes (Signed)
Patient ID: Hannah [name], female   DOB: [birth year], 43 y.o.   MRN: [id number]  ADVANCED HEART FAILURE ROUNDING NOTE   SUBJECTIVE  43 yo female with PMH of DM1, stage V kidney disease, blindness, h/o lung CA s/p resection who was recently discharged for heart failure symptom had a high risk stress test which showed ischemia in anterior and apical region. She underwent cath 09/04/2013 which showed chronically occluded RCA and triple vessel dx. CT surgery consulted but not felt to be surgical candidate due to poor PFTs, suboptimal targets, comorbidities and shock. EF 22% by Myoview. 30-35% by echo  Underwent placement of swan and IABP for shock (co-ox 38%). Unfortunately developed cold foot and IABP had to be removed.Underwent R femoral thrombectomy and 4 compartment fasciotomy of RLE on 6/1.On 6/3 had VT/VF->PEA arrest 10 mins CPR. U/s shows R axillary DVT  Now on intermittent HD. Dobutamine back down to 2.0  CO-OX 59%. Unable to get HD yesterday because HD cath clotted. Now getting TPA dwelling in catheter. Resting comfortably.     CURRENT MEDS . alteplase  2 mg Intracatheter Once  . anticoagulant sodium citrate  5 mL Intravenous Once  . aspirin  81 mg Oral Daily  . atorvastatin  80 mg Oral q1800  . calcitRIOL  0.25 mcg Oral Daily  . darbepoetin (ARANESP) injection - NON-DIALYSIS  200 mcg Subcutaneous Q Wed-1800  . feeding supplement (NEPRO CARB STEADY)  237 mL Oral TID WC  . fludrocortisone  0.1 mg Oral Daily  . insulin aspart  0-15 Units Subcutaneous TID WC  . insulin aspart  0-5 Units Subcutaneous QHS  . insulin aspart  4 Units Subcutaneous TID WC  . insulin glargine  35 Units Subcutaneous Q24H  . levETIRAcetam  1,000 mg Oral BID  . lidocaine (PF)  5 mL Other Once  . midodrine  10 mg Oral TID WC  . pantoprazole  40 mg Oral Daily  . polyethylene glycol  17 g Oral Daily  . sodium chloride  10-40 mL Intracatheter Q12H  . Warfarin - Pharmacist Dosing Inpatient   Does not apply q1800     OBJECTIVE  Filed Vitals:   10/21/13 0200 10/21/13 0300 10/21/13 0400 10/21/13 0500  BP: 128/79 121/78 126/80 123/71  Pulse: 105 103 101 101  Temp:      TempSrc:      Resp: 11 10 9 14   Height:      Weight:      SpO2: 99% 98% 100% 100%    Intake/Output Summary (Last 24 hours) at 10/21/13 0537 Last data filed at 10/21/13 0500  Gross per 24 hour  Intake  313.5 ml  Output    469 ml  Net -155.5 ml   Filed Weights   10/19/13 1650 10/20/13 0700 10/20/13 1619  Weight: 123 kg (271 lb 2.7 oz) 122.1 kg (269 lb 2.9 oz) 123.3 kg (271 lb 13.2 oz)    PHYSICAL EXAM  CVP 14 General: Lying in bed.  NAD Neuro: Alert and oriented X 3. HEENT:  Normal  Neck: Supple without bruits. Chest L subclav PC. R subclav TLC. JVP up Lungs:  clear Heart: tachy regular.  Abdomen: Soft, non-tender, non-distended, BS + x 4.  Extremities: 2+ edema bilateral ankles. Wound vac in R groin and RLE  CBC  Recent Labs  10/20/13 0440 10/21/13 0425  WBC 5.1 5.2  HGB 7.7* 7.7*  HCT 24.6* 25.0*  MCV 90.8 90.9  PLT 285 233   Basic Metabolic Panel  Recent  Labs  10/20/13 0440 10/21/13 0425  NA 134* 130*  K 4.3 5.0  CL 96 92*  CO2 25 24  GLUCOSE 125* 137*  BUN 33* 42*  CREATININE 4.47* 5.85*  CALCIUM 7.9* 8.3*  MG 2.1 2.2  PHOS 4.4 5.4*   Liver Function Tests  Recent Labs  10/20/13 0440 10/21/13 0425  ALBUMIN 2.2* 2.3*   Cardiac Enzymes No results found for this basename: CKTOTAL, CKMB, CKMBINDEX, TROPONINI,  in the last 72 hours Thyroid Function Tests No results found for this basename: TSH, T4TOTAL, FREET3, T3FREE, THYROIDAB,  in the last 72 hours  TELE  Sinus tachycardia 100-110   Radiology/Studies  Dg Chest 2 View  09/06/2013   CLINICAL DATA:  CHF, intermittent dyspnea  EXAM: CHEST  2 VIEW  COMPARISON:  Prior chest x-ray 08/13/2013; prior chest CT 08/22/2013  FINDINGS: Stable cardiac and mediastinal contours. Atherosclerotic calcifications are present within the transverse  aorta. Moderate left and small right pleural effusions are similar compared to prior. There is persistent associated bibasilar atelectasis. Mild pulmonary vascular congestion without overt edema. No pneumothorax. No new focal airspace consolidation. No acute osseous abnormality.  IMPRESSION: 1. Stable moderate left and small right pleural effusions and associated bibasilar atelectasis. 2. Pulmonary vascular congestion without overt edema.   Electronically Signed   By: Jacqulynn Cadet M.D.   On: 09/06/2013 07:55   Ct Chest Wo Contrast  08/22/2013   CLINICAL DATA:  Left lung cancer status post lobectomy. Weight gain. Renal insufficiency.  EXAM: CT CHEST WITHOUT CONTRAST  TECHNIQUE: Multidetector CT imaging of the chest was performed following the standard protocol without IV contrast.  COMPARISON:  Radiographs 08/13/2013 and 08/04/2013.  CT 08/02/2012.  FINDINGS: There are stable postsurgical changes related to prior left lower lobe resection. There has been interval enlargement of several mediastinal lymph nodes. These include 12 mm right paratracheal (image 19), 13 mm precarinal (image 23), and 14 mm AP window (image 25) lymph nodes. Some of these nodes have retained fatty hila. Allowing for the limitations of noncontrast technique, the hila appear stable.  There is stable low-density within the right thyroid lobe. Atherosclerosis of the aorta, great vessels and coronary arteries is noted. The heart size is normal. There is no pericardial effusion.  Moderate size dependent pleural effusions are present bilaterally. There is associated dependent airspace disease in both lower lobes. In addition, there are more focal airspace opacities within the right lower lobe and inferior aspect of the left upper lobe. The latter is somewhat nodular, measuring up to 1.8 cm on image 43. No other focal nodularity or endobronchial lesions are demonstrated.  The visualized upper abdomen has a stable appearance. There is no adrenal  mass. There is increased subcutaneous edema throughout the subcutaneous fat, especially within the anterior aspect of the upper abdomen.  No worrisome osseous findings are demonstrated.  IMPRESSION: 1. As demonstrated radiographically, there are bilateral pleural effusions and bilateral airspace opacities which are new compared with the prior CT. Although there are focal somewhat nodular components in the right lower and left upper lobes, these findings are most likely infectious/inflammatory. 2. Progressive mediastinal lymphadenopathy. Some of the nodes have retained fatty hila and may be reactive. Metastatic disease cannot be completely excluded. 3. Stable atherosclerosis and low-density within the right thyroid lobe. 4. If the airspace opacities and pleural effusions fail to respond to appropriate clinical therapy, follow-up CT or PET-CT may be warranted to exclude metastatic disease.   Electronically Signed   By: Camie Patience  M.D.   On: 08/22/2013 10:26   Dg Chest Portable 1 View  08/13/2013   CLINICAL DATA:  Shortness of breath increasing for 2 weeks. History of CHF.  EXAM: PORTABLE CHEST - 1 VIEW  COMPARISON:  08/04/2013  FINDINGS: Again noted are bibasilar lung densities that are suggestive for pleural effusions and atelectasis. Heart size appears to be enlarged. There is some peribronchial thickening and cannot exclude pulmonary edema.  IMPRESSION: Bilateral pleural effusions with basilar atelectasis. There is mild pulmonary edema. Minimal change from the previous examination.   Electronically Signed   By: Markus Daft M.D.   On: 08/13/2013 23:10    ASSESSMENT AND PLAN 1. Cardiogenic shock 2. Acute on chronic systolic HF     EF ~72-53% 3. 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 Acute respiratory failure 5. 3-V CAD, severe.  6. Carotid stenosis     - doppler 08/31/2013 shows R 60-79% ICA stenosis, L 80-99% ICA stenosis     - vascular surgery aware 7. Lung CA  h/o LLL lobectomy (2 yr ago due to non-small cell  carcinoma)      - PFT 09/05/2013 pre-FEV1 28%, pre-FVC 34%, pre-FEV1/FVC ratio 68 8. DM1 9. Ischemic R foot s/p R femoral thrombectomy and 4-compartment fasciotomy.  10. VT/VF arrest 6/3 11. R axillary DVT 12. Severe protein calorie malnutrition  13. Large right groin wound s/p debridement 6/12 14. Anemia: Transfuse hemoglobin < 7.  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 Volume up. Hopefully Renal can pull some fluid today. If continue to tolerate IHD may be able to go to SDU tomorrow.   Wound care per [facility]. She may need further debridement of groin wound.   Continue keppra for myoclonus  Warfarin dosing d/w Pharmacy.   Continue to manage HF and CAD medically.   Benay Spice 5:37 AM

## 2013-10-22 LAB — RENAL FUNCTION PANEL
Albumin: 2.2 g/dL — ABNORMAL LOW (ref 3.5–5.2)
Anion gap: 11 (ref 5–15)
BUN: 31 mg/dL — ABNORMAL HIGH (ref 6–23)
CALCIUM: 8.1 mg/dL — AB (ref 8.4–10.5)
CO2: 28 mEq/L (ref 19–32)
Chloride: 95 mEq/L — ABNORMAL LOW (ref 96–112)
Creatinine, Ser: 4.67 mg/dL — ABNORMAL HIGH (ref 0.50–1.10)
GFR, EST AFRICAN AMERICAN: 12 mL/min — AB (ref >90)
GFR, EST NON AFRICAN AMERICAN: 10 mL/min — AB (ref >90)
Glucose, Bld: 144 mg/dL — ABNORMAL HIGH (ref 70–99)
Phosphorus: 4.4 mg/dL (ref 2.3–4.6)
Potassium: 4.4 mEq/L (ref 3.7–5.3)
SODIUM: 134 meq/L — AB (ref 137–147)

## 2013-10-22 LAB — CBC
HCT: 24.5 % — ABNORMAL LOW (ref 36.0–46.0)
Hemoglobin: 7.6 g/dL — ABNORMAL LOW (ref 12.0–15.0)
MCH: 28.4 pg (ref 26.0–34.0)
MCHC: 31 g/dL (ref 30.0–36.0)
MCV: 91.4 fL (ref 78.0–100.0)
PLATELETS: 311 10*3/uL (ref 150–400)
RBC: 2.68 MIL/uL — ABNORMAL LOW (ref 3.87–5.11)
RDW: 17.2 % — ABNORMAL HIGH (ref 11.5–15.5)
WBC: 5.3 10*3/uL (ref 4.0–10.5)

## 2013-10-22 LAB — GLUCOSE, CAPILLARY
GLUCOSE-CAPILLARY: 113 mg/dL — AB (ref 70–99)
GLUCOSE-CAPILLARY: 88 mg/dL (ref 70–99)
GLUCOSE-CAPILLARY: 188 mg/dL — AB (ref 70–99)
Glucose-Capillary: 148 mg/dL — ABNORMAL HIGH (ref 70–99)
Glucose-Capillary: 69 mg/dL — ABNORMAL LOW (ref 70–99)

## 2013-10-22 LAB — CARBOXYHEMOGLOBIN
CARBOXYHEMOGLOBIN: 2.2 % — AB (ref 0.5–1.5)
Methemoglobin: 0.4 % (ref 0.0–1.5)
O2 Saturation: 71.2 %
TOTAL HEMOGLOBIN: 7.8 g/dL — AB (ref 12.0–16.0)

## 2013-10-22 LAB — PROTIME-INR
INR: 3.26 — ABNORMAL HIGH (ref 0.00–1.49)
Prothrombin Time: 33.2 seconds — ABNORMAL HIGH (ref 11.6–15.2)

## 2013-10-22 LAB — MAGNESIUM: Magnesium: 2.1 mg/dL (ref 1.5–2.5)

## 2013-10-22 MED ORDER — PENTAFLUOROPROP-TETRAFLUOROETH EX AERO: 1.0000 "application " | INHALATION_SPRAY | CUTANEOUS | Status: DC | PRN

## 2013-10-22 MED ORDER — NEPRO/CARBSTEADY PO LIQD: 237.0000 mL | ORAL | Status: DC | PRN

## 2013-10-22 MED ORDER — LIDOCAINE HCL (PF) 1 % IJ SOLN: 5.0000 mL | INTRAMUSCULAR | Status: DC | PRN

## 2013-10-22 MED ORDER — SODIUM CHLORIDE 0.9 % IV SOLN: 100.0000 mL | INTRAVENOUS | Status: DC | PRN

## 2013-10-22 MED ORDER — ALTEPLASE 2 MG IJ SOLR: 2.0000 mg | Freq: Once | INTRAMUSCULAR | Status: DC | PRN

## 2013-10-22 MED ORDER — LIDOCAINE-PRILOCAINE 2.5-2.5 % EX CREA: 1.0000 "application " | TOPICAL_CREAM | CUTANEOUS | Status: DC | PRN

## 2013-10-22 MED ORDER — HEPARIN SODIUM (PORCINE) 1000 UNIT/ML DIALYSIS: 1000.0000 [IU] | INTRAMUSCULAR | Status: DC | PRN

## 2013-10-22 MED ORDER — HEPARIN SODIUM (PORCINE) 1000 UNIT/ML DIALYSIS: 20.0000 [IU]/kg | Freq: Once | INTRAMUSCULAR | Status: DC

## 2013-10-22 MED ORDER — FENTANYL CITRATE 0.05 MG/ML IJ SOLN
INTRAMUSCULAR | Status: AC
  Administered 2013-10-22: 50 ug via INTRAVENOUS
  Filled 2013-10-22: qty 2

## 2013-10-22 MED ORDER — HEPARIN SODIUM (PORCINE) 1000 UNIT/ML DIALYSIS: 20.0000 [IU]/kg | INTRAMUSCULAR | Status: DC | PRN

## 2013-10-22 NOTE — Procedures (Signed)
Patient seen on Hemodialysis. QB 400, UF goal 3.5L Treatment adjusted as needed. Try HD in chair for next session and address CLIP as dobutamine weaned off  Elmarie Shiley MD [facility]. Office # [phone] Pager # [phone] 9:32 AM

## 2013-10-22 NOTE — Progress Notes (Signed)
Patient ID: Hannah [name], female   DOB: [birth year], 43 y.o.   MRN: [id number]  ADVANCED HEART FAILURE ROUNDING NOTE   SUBJECTIVE  43 yo female with PMH of DM1, stage V kidney disease, blindness, h/o lung CA s/p resection who was recently discharged for heart failure symptom had a high risk stress test which showed ischemia in anterior and apical region. She underwent cath 09/04/2013 which showed chronically occluded RCA and triple vessel dx. CT surgery consulted but not felt to be surgical candidate due to poor PFTs, suboptimal targets, comorbidities and shock. EF 22% by Myoview. 30-35% by echo  Underwent placement of swan and IABP for shock (co-ox 38%). Unfortunately developed cold foot and IABP had to be removed.Underwent R femoral thrombectomy and 4 compartment fasciotomy of RLE on 6/1.On 6/3 had VT/VF->PEA arrest 10 mins CPR. U/s shows R axillary DVT.   Now on intermittent HD. Had UF yesterday. Dobutamine back down to 2.0  CO-OX 71%. Weight coming down. Denies CP or SOB    CURRENT MEDS . alteplase  2 mg Intracatheter Once  . anticoagulant sodium citrate  5 mL Intravenous Once  . aspirin  81 mg Oral Daily  . atorvastatin  80 mg Oral q1800  . calcitRIOL  0.25 mcg Oral Daily  . darbepoetin (ARANESP) injection - NON-DIALYSIS  200 mcg Subcutaneous Q Wed-1800  . feeding supplement (NEPRO CARB STEADY)  237 mL Oral TID WC  . fludrocortisone  0.1 mg Oral Daily  . insulin aspart  0-15 Units Subcutaneous TID WC  . insulin aspart  0-5 Units Subcutaneous QHS  . insulin aspart  4 Units Subcutaneous TID WC  . insulin glargine  20 Units Subcutaneous QHS  . levETIRAcetam  1,000 mg Oral BID  . lidocaine (PF)  5 mL Other Once  . midodrine  10 mg Oral TID WC  . pantoprazole  40 mg Oral Daily  . polyethylene glycol  17 g Oral Daily  . sodium chloride  10-40 mL Intracatheter Q12H  . Warfarin - Pharmacist Dosing Inpatient   Does not apply q1800    OBJECTIVE  Filed Vitals:   10/22/13 0500 10/22/13  0600 10/22/13 0700 10/22/13 0747  BP: 111/65 120/64 117/69   Pulse: 107 104 103   Temp:    97.7 F (36.5 C)  TempSrc:    Oral  Resp: 11 16 11    Height:      Weight:   121.5 kg (267 lb 13.7 oz)   SpO2: 100% 100% 100%     Intake/Output Summary (Last 24 hours) at 10/22/13 0810 Last data filed at 10/22/13 0700  Gross per 24 hour  Intake    765 ml  Output    100 ml  Net    665 ml   Filed Weights   10/21/13 0800 10/21/13 1202 10/22/13 0700  Weight: 124.2 kg (273 lb 13 oz) 122.3 kg (269 lb 10 oz) 121.5 kg (267 lb 13.7 oz)    PHYSICAL EXAM  CVP 14 General: Lying in bed.  NAD Neuro: Alert and oriented X 3. HEENT:  Normal  Neck: Supple without bruits. Chest L subclav PC. R subclav TLC. JVP up Lungs:  clear Heart: tachy regular.  Abdomen: Soft, non-tender, non-distended, BS + x 4.  Extremities: 2+ edema bilateral ankles. Wound vac in R groin and RLE  CBC  Recent Labs  10/21/13 0425 10/22/13 0400  WBC 5.2 5.3  HGB 7.7* 7.6*  HCT 25.0* 24.5*  MCV 90.9 91.4  PLT 308 311  Basic Metabolic Panel  Recent Labs  10/21/13 0425 10/22/13 0400  NA 130* 134*  K 5.0 4.4  CL 92* 95*  CO2 24 28  GLUCOSE 137* 144*  BUN 42* 31*  CREATININE 5.85* 4.67*  CALCIUM 8.3* 8.1*  MG 2.2 2.1  PHOS 5.4* 4.4   Liver Function Tests  Recent Labs  10/21/13 0425 10/22/13 0400  ALBUMIN 2.3* 2.2*   Cardiac Enzymes No results found for this basename: CKTOTAL, CKMB, CKMBINDEX, TROPONINI,  in the last 72 hours Thyroid Function Tests No results found for this basename: TSH, T4TOTAL, FREET3, T3FREE, THYROIDAB,  in the last 72 hours  TELE  Sinus tachycardia 100-110   Radiology/Studies  Dg Chest 2 View  09/06/2013   CLINICAL DATA:  CHF, intermittent dyspnea  EXAM: CHEST  2 VIEW  COMPARISON:  Prior chest x-ray 08/13/2013; prior chest CT 08/22/2013  FINDINGS: Stable cardiac and mediastinal contours. Atherosclerotic calcifications are present within the transverse aorta. Moderate left and  small right pleural effusions are similar compared to prior. There is persistent associated bibasilar atelectasis. Mild pulmonary vascular congestion without overt edema. No pneumothorax. No new focal airspace consolidation. No acute osseous abnormality.  IMPRESSION: 1. Stable moderate left and small right pleural effusions and associated bibasilar atelectasis. 2. Pulmonary vascular congestion without overt edema.   Electronically Signed   By: Jacqulynn Cadet M.D.   On: 09/06/2013 07:55   Ct Chest Wo Contrast  08/22/2013   CLINICAL DATA:  Left lung cancer status post lobectomy. Weight gain. Renal insufficiency.  EXAM: CT CHEST WITHOUT CONTRAST  TECHNIQUE: Multidetector CT imaging of the chest was performed following the standard protocol without IV contrast.  COMPARISON:  Radiographs 08/13/2013 and 08/04/2013.  CT 08/02/2012.  FINDINGS: There are stable postsurgical changes related to prior left lower lobe resection. There has been interval enlargement of several mediastinal lymph nodes. These include 12 mm right paratracheal (image 19), 13 mm precarinal (image 23), and 14 mm AP window (image 25) lymph nodes. Some of these nodes have retained fatty hila. Allowing for the limitations of noncontrast technique, the hila appear stable.  There is stable low-density within the right thyroid lobe. Atherosclerosis of the aorta, great vessels and coronary arteries is noted. The heart size is normal. There is no pericardial effusion.  Moderate size dependent pleural effusions are present bilaterally. There is associated dependent airspace disease in both lower lobes. In addition, there are more focal airspace opacities within the right lower lobe and inferior aspect of the left upper lobe. The latter is somewhat nodular, measuring up to 1.8 cm on image 43. No other focal nodularity or endobronchial lesions are demonstrated.  The visualized upper abdomen has a stable appearance. There is no adrenal mass. There is increased  subcutaneous edema throughout the subcutaneous fat, especially within the anterior aspect of the upper abdomen.  No worrisome osseous findings are demonstrated.  IMPRESSION: 1. As demonstrated radiographically, there are bilateral pleural effusions and bilateral airspace opacities which are new compared with the prior CT. Although there are focal somewhat nodular components in the right lower and left upper lobes, these findings are most likely infectious/inflammatory. 2. Progressive mediastinal lymphadenopathy. Some of the nodes have retained fatty hila and may be reactive. Metastatic disease cannot be completely excluded. 3. Stable atherosclerosis and low-density within the right thyroid lobe. 4. If the airspace opacities and pleural effusions fail to respond to appropriate clinical therapy, follow-up CT or PET-CT may be warranted to exclude metastatic disease.   Electronically Signed  By: Camie Patience M.D.   On: 08/22/2013 10:26   Dg Chest Portable 1 View  08/13/2013   CLINICAL DATA:  Shortness of breath increasing for 2 weeks. History of CHF.  EXAM: PORTABLE CHEST - 1 VIEW  COMPARISON:  08/04/2013  FINDINGS: Again noted are bibasilar lung densities that are suggestive for pleural effusions and atelectasis. Heart size appears to be enlarged. There is some peribronchial thickening and cannot exclude pulmonary edema.  IMPRESSION: Bilateral pleural effusions with basilar atelectasis. There is mild pulmonary edema. Minimal change from the previous examination.   Electronically Signed   By: Markus Daft M.D.   On: 08/13/2013 23:10    ASSESSMENT AND PLAN 1. Cardiogenic shock 2. Acute on chronic systolic HF     EF ~30-09% 3. 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 Acute respiratory failure 5. 3-V CAD, severe.  6. Carotid stenosis     - doppler 08/31/2013 shows R 60-79% ICA stenosis, L 80-99% ICA stenosis     - vascular surgery aware 7. Lung CA  h/o LLL lobectomy (2 yr ago due to non-small cell carcinoma)      - PFT  09/05/2013 pre-FEV1 28%, pre-FVC 34%, pre-FEV1/FVC ratio 68 8. DM1 9. Ischemic R foot s/p R femoral thrombectomy and 4-compartment fasciotomy.  10. VT/VF arrest 6/3 11. R axillary DVT 12. Severe protein calorie malnutrition  13. Large right groin wound s/p debridement 6/12 14. Anemia: Transfuse hemoglobin < 7.  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 Will wean to 71mcg/kg/min. Volume improving. I think she can got SDU. Will d/w with nursing staff.   Wound care per [facility]. She may need further debridement of groin wound.   Continue keppra for myoclonus  Warfarin dosing d/w Pharmacy.   Continue to manage HF and CAD medically. Therapy limited by hypotension.   Daniel Bensimhon,MD 8:10 AM

## 2013-10-22 NOTE — Progress Notes (Signed)
ANTICOAGULATION CONSULT NOTE - Follow Up Consult  Pharmacy Consult for Coumadin Indication: Thromboembolic state (RUE DVT)  Allergies  Allergen Reactions  . Crestor [Rosuvastatin] Other (See Comments)    Severe muscle weakness  . Nsaids Other (See Comments)    Not allergic, "bad on my kidneys"  . Ciprofloxacin Rash    Patient Measurements: Height: 5' 7.5" (171.5 cm) Weight: 268 lb 4.8 oz (121.7 kg) IBW/kg (Calculated) : 62.75  Vital Signs: Temp: 97.5 F (36.4 C) (07/06 0810) Temp src: Oral (07/06 0810) BP: 139/68 mmHg (07/06 1100) Pulse Rate: 94 (07/06 1100)  Labs:  Recent Labs  10/20/13 0440 10/21/13 0425 10/22/13 0400  HGB 7.7* 7.7* 7.6*  HCT 24.6* 25.0* 24.5*  PLT 285 308 311  LABPROT 36.5* 34.2* 33.2*  INR 3.68* 3.38* 3.26*  CREATININE 4.47* 5.85* 4.67*    Estimated Creatinine Clearance: 20.7 ml/min (by C-G formula based on Cr of 4.67).  Assessment: 43yof continues on coumadin for RUE DVT. She has completed 5 days of overlap therapy with argatroban. Argatroban was stopped 6/25 due to therapeutic INR.   INR today remains SUPRAtherapeutic despite dosing held for the past 3 days (INR 3.6>3.3>3.2) Hgb/Hct low but stable, plts 311. No overt s/sx of bleeding noted. Will hold warfarin again today, will want to give low dose warfarin as soon as INR appears stable.  Goal of Therapy: INR 2-3 Monitor platelets by anticoagulation protocol: Yes   Plan:  1. Hold warfarin today  2. Follow-up AM INR 3. Coumadin education eventually once discharge plan more clear.  Erin Hearing PharmD., BCPS Clinical Pharmacist Pager [phone] 10/22/2013 11:13 AM

## 2013-10-22 NOTE — Progress Notes (Signed)
1226 CBG 69 apple juice and Nepro given to patient. Hannah [name] had been in HD and missed breakfast) 1259 CBG 88 now eating lunch.

## 2013-10-22 NOTE — Progress Notes (Signed)
Patient ID: Hannah [name], female   DOB: [birth year], 43 y.o.   MRN: [id number]   [facility] Progress Note    Assessment/ Plan:   1. ESRD- (dialysis dependent since 5/21 and off CVVHD 6/28 and first IHD 6/29) tolerated IHD well over weekend after tPA to HD catheter. Cont with HD qMWF with HD session today while attempting to maximize UF. 2. Cardiogenic shock/ICMP with EF of 22% on dobutamine (now down to 2.5 mcg/kg/min, down from 6), midodrine, and florinef. Weight continues to decrease with UF. 3. Anemia of chronic disease- on aranesp 258mcg weekly 4. Right axillary DVT: on coumadin per pharmacy 5. Bilateral carotid stenosis 6. Vascular access- HD via TDC, AVF/AVG when current cardiac instability improves 7. DM- on insulin 8. S/p right femoral endarterectomy/fasciotomy with wound dehiscence and now on vac (episode of vascular insufficiency with cold foot after IABP) 9. 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 VT/VF then PEA arrest 6/3.  Subjective:   No acute events overnight- remains on low dose pressors   Objective:   BP 117/69  Pulse 103  Temp(Src) 98 F (36.7 C) (Oral)  Resp 11  Ht 5' 7.5" (1.715 m)  Wt 122.3 kg (269 lb 10 oz)  BMI 41.58 kg/m2  SpO2 100%  LMP 08/19/2013  Intake/Output Summary (Last 24 hours) at 10/22/13 0733 Last data filed at 10/22/13 0700  Gross per 24 hour  Intake  983.5 ml  Output    100 ml  Net  883.5 ml   Weight change: 0.9 kg (1 lb 15.8 oz)  Physical Exam: TIW:PYKDXIPJASN resting in bed KNL:ZJQBH regular tachycardia, distant S1 and s2  Resp:Coarse BS bilaterally, no rales/rhonchi ALP:FXTK, obese, NT, Bs normal Ext:3+ LE edema, Right shin with wound vac, wound vac right groin  Imaging: No results found.  Labs: BMET  Recent Labs Lab 10/16/13 0455 10/17/13 0425 10/18/13 0500 10/19/13 0355 10/20/13 0440 10/21/13 0425 10/22/13 0400  NA 136* 131* 132* 132* 134* 130* 134*  K 4.4 4.6 4.2 4.8 4.3 5.0 4.4  CL 96 92*  93* 92* 96 92* 95*  CO2 28 27 26 25 25 24 28   GLUCOSE 142* 114* 132* 161* 125* 137* 144*  BUN 27* 40* 36* 49* 33* 42* 31*  CREATININE 3.65* 5.07* 4.65* 5.85* 4.47* 5.85* 4.67*  CALCIUM 8.3* 8.4 7.9* 8.1* 7.9* 8.3* 8.1*  PHOS 4.2 5.2* 5.0* 6.2* 4.4 5.4* 4.4   CBC  Recent Labs Lab 10/19/13 0355 10/20/13 0440 10/21/13 0425 10/22/13 0400  WBC 5.3 5.1 5.2 5.3  HGB 7.7* 7.7* 7.7* 7.6*  HCT 24.6* 24.6* 25.0* 24.5*  MCV 91.8 90.8 90.9 91.4  PLT 302 285 308 311    Medications:    . alteplase  2 mg Intracatheter Once  . anticoagulant sodium citrate  5 mL Intravenous Once  . aspirin  81 mg Oral Daily  . atorvastatin  80 mg Oral q1800  . calcitRIOL  0.25 mcg Oral Daily  . darbepoetin (ARANESP) injection - NON-DIALYSIS  200 mcg Subcutaneous Q Wed-1800  . feeding supplement (NEPRO CARB STEADY)  237 mL Oral TID WC  . fludrocortisone  0.1 mg Oral Daily  . insulin aspart  0-15 Units Subcutaneous TID WC  . insulin aspart  0-5 Units Subcutaneous QHS  . insulin aspart  4 Units Subcutaneous TID WC  . insulin glargine  20 Units Subcutaneous QHS  . levETIRAcetam  1,000 mg Oral BID  . lidocaine (PF)  5 mL Other Once  . midodrine  10 mg Oral TID  WC  . pantoprazole  40 mg Oral Daily  . polyethylene glycol  17 g Oral Daily  . sodium chloride  10-40 mL Intracatheter Q12H  . Warfarin - Pharmacist Dosing Inpatient   Does not apply [id number]   Elmarie Shiley, MD 10/22/2013, 7:33 AM

## 2013-10-22 NOTE — Progress Notes (Signed)
PT Cancellation Note  Patient Details Name: Hannah [name] MRN: [id number] DOB: April 14, 1971   Cancelled Treatment:    Reason Eval/Treat Not Completed: Patient at procedure or test/unavailable (HD)   [facility] 10/22/2013, 3:27 PM

## 2013-10-23 LAB — GLUCOSE, CAPILLARY
GLUCOSE-CAPILLARY: 167 mg/dL — AB (ref 70–99)
GLUCOSE-CAPILLARY: 135 mg/dL — AB (ref 70–99)
Glucose-Capillary: 189 mg/dL — ABNORMAL HIGH (ref 70–99)
Glucose-Capillary: 150 mg/dL — ABNORMAL HIGH (ref 70–99)
Glucose-Capillary: 176 mg/dL — ABNORMAL HIGH (ref 70–99)

## 2013-10-23 LAB — CARBOXYHEMOGLOBIN
CARBOXYHEMOGLOBIN: 2 % — AB (ref 0.5–1.5)
METHEMOGLOBIN: 0.8 % (ref 0.0–1.5)
O2 SAT: 57.5 %
Total hemoglobin: 9.4 g/dL — ABNORMAL LOW (ref 12.0–16.0)

## 2013-10-23 LAB — RENAL FUNCTION PANEL
ALBUMIN: 2.3 g/dL — AB (ref 3.5–5.2)
ANION GAP: 12 (ref 5–15)
BUN: 24 mg/dL — ABNORMAL HIGH (ref 6–23)
CO2: 28 mEq/L (ref 19–32)
Calcium: 8.3 mg/dL — ABNORMAL LOW (ref 8.4–10.5)
Chloride: 96 mEq/L (ref 96–112)
Creatinine, Ser: 3.89 mg/dL — ABNORMAL HIGH (ref 0.50–1.10)
GFR calc non Af Amer: 13 mL/min — ABNORMAL LOW (ref >90)
GFR, EST AFRICAN AMERICAN: 15 mL/min — AB (ref >90)
GLUCOSE: 154 mg/dL — AB (ref 70–99)
POTASSIUM: 4.3 meq/L (ref 3.7–5.3)
Phosphorus: 3.7 mg/dL (ref 2.3–4.6)
SODIUM: 136 meq/L — AB (ref 137–147)

## 2013-10-23 LAB — CBC
HEMATOCRIT: 26.9 % — AB (ref 36.0–46.0)
HEMOGLOBIN: 8.1 g/dL — AB (ref 12.0–15.0)
MCH: 27.8 pg (ref 26.0–34.0)
MCHC: 30.1 g/dL (ref 30.0–36.0)
MCV: 92.4 fL (ref 78.0–100.0)
Platelets: 346 10*3/uL (ref 150–400)
RBC: 2.91 MIL/uL — AB (ref 3.87–5.11)
RDW: 17.2 % — ABNORMAL HIGH (ref 11.5–15.5)
WBC: 5.7 10*3/uL (ref 4.0–10.5)

## 2013-10-23 LAB — PROTIME-INR
INR: 2.26 — ABNORMAL HIGH (ref 0.00–1.49)
PROTHROMBIN TIME: 25 s — AB (ref 11.6–15.2)

## 2013-10-23 LAB — MAGNESIUM: MAGNESIUM: 2 mg/dL (ref 1.5–2.5)

## 2013-10-23 MED ORDER — COLLAGENASE 250 UNIT/GM EX OINT
TOPICAL_OINTMENT | Freq: Every day | CUTANEOUS | Status: DC
  Administered 2013-10-23: 1 via TOPICAL
  Administered 2013-10-24: 13:00:00 via TOPICAL
  Administered 2013-10-25: 1 via TOPICAL
  Administered 2013-10-26 – 2013-10-29 (×4): via TOPICAL
  Filled 2013-10-23 (×2): qty 30

## 2013-10-23 MED ORDER — WARFARIN SODIUM 5 MG PO TABS
5.0000 mg | ORAL_TABLET | Freq: Once | ORAL | Status: AC
  Administered 2013-10-23: 5 mg via ORAL
  Filled 2013-10-23: qty 1

## 2013-10-23 NOTE — Progress Notes (Signed)
NUTRITION FOLLOW UP  DOCUMENTATION CODES Per approved criteria  -Morbid Obesity   INTERVENTION:  Continue Nepro Shake po TID, each supplement provides 425 kcal and 19 grams protein RD to follow for nutrition care plan  NUTRITION DIAGNOSIS: Increased nutrient needs related to wound healing, ESRD as evidenced by estimated nutrition needs, ongoing  Goal: Pt to meet >/= 90% of their estimated nutrition needs, met  Monitor:  PO & supplemental intake, weight, labs, I/O's  ASSESSMENT: 42 yo morbidly obese female with PMH of DM1, stage V CKD, blindness, h/o lung CA s/p resection; had a high risk stress test which showed ischemia in anterior and apical region. She underwent cath 09/04/2013 which showed chronically occluded RCA and triple vessel dx.   Underwent placement of swan and IABP last week for shock (co-ox 38%). Unfortunately developed cold foot and IABP had to be removed.Underwent R femoral thrombectomy and 4 compartment fasciotomy of RLE on 6/1.On 6/3 had VT/VF->PEA arrest 10 mins CPR. U/s shows R axillary DVT.  Patient s/p procedure 6/12: I&D RIGHT GROIN, INCLUDING SKIN, SUBCUTANEOUS TISSUE PLACEMENT OF WOUND VAC   Patient continues on intermittent HD.  CWOCN note reviewed 7/2.  Vascular note reviewed 7/7.  VAC to groin discontinued, to continue VAC on fasciotomies.  No evidence of invasive infection.  Patient continues to eat well.  PO intake 100% per flowsheet records.  Drinking her Nepro Shake supplements.  Height: Ht Readings from Last 1 Encounters:  09/03/13 5' 7.5" (1.715 m)    Weight: Wt Readings from Last 1 Encounters:  10/23/13 267 lb 10.2 oz (121.4 kg)    BMI:  Body mass index is 41.28 kg/(m^2).  Estimated Nutritional Needs: Kcal: 2000-2200 Protein: 115-125 gm Fluid: 2.0-2.2 L  Skin: VAC to fasciotomies  Diet Order: Diabetic   Intake/Output Summary (Last 24 hours) at 10/23/13 1408 Last data filed at 10/23/13 1200  Gross per 24 hour  Intake  417.4  ml  Output     75 ml  Net  342.4 ml    Labs:   Recent Labs Lab 10/21/13 0425 10/22/13 0400 10/23/13 0334  NA 130* 134* 136*  K 5.0 4.4 4.3  CL 92* 95* 96  CO2 24 28 28   BUN 42* 31* 24*  CREATININE 5.85* 4.67* 3.89*  CALCIUM 8.3* 8.1* 8.3*  MG 2.2 2.1 2.0  PHOS 5.4* 4.4 3.7  GLUCOSE 137* 144* 154*    CBG (last 3)   Recent Labs  10/22/13 2216 10/23/13 0730 10/23/13 1135  GLUCAP 189* 150* 167*    Scheduled Meds: . alteplase  2 mg Intracatheter Once  . aspirin  81 mg Oral Daily  . atorvastatin  80 mg Oral q1800  . calcitRIOL  0.25 mcg Oral Daily  . collagenase   Topical Daily  . darbepoetin (ARANESP) injection - NON-DIALYSIS  200 mcg Subcutaneous Q Wed-1800  . feeding supplement (NEPRO CARB STEADY)  237 mL Oral TID WC  . fludrocortisone  0.1 mg Oral Daily  . insulin aspart  0-15 Units Subcutaneous TID WC  . insulin aspart  0-5 Units Subcutaneous QHS  . insulin aspart  4 Units Subcutaneous TID WC  . insulin glargine  20 Units Subcutaneous QHS  . levETIRAcetam  1,000 mg Oral BID  . lidocaine (PF)  5 mL Other Once  . midodrine  10 mg Oral TID WC  . pantoprazole  40 mg Oral Daily  . polyethylene glycol  17 g Oral Daily  . sodium chloride  10-40 mL Intracatheter Q12H  .  Warfarin - Pharmacist Dosing Inpatient   Does not apply q1800    Continuous Infusions:    Past Medical History  Diagnosis Date  . High cholesterol   . Diabetic retinopathy   . Peripheral neuropathy     "tips of toes"  . Blind right eye   . CHF (congestive heart failure)   . CAD (coronary artery disease)   . GERD (gastroesophageal reflux disease)   . Hypertension   . History of lung cancer 07/2011    s/p left lower lobectomy  . Diabetes mellitus     IDDM  . Cataract of right eye   . Ulcer of toe of right foot 07/10/2012    great toe  . Breast calcification, left 06/2012  . Acute biphenotypic leukemia   . CKD (chronic kidney disease) stage 5, GFR less than 15 ml/min   . Nephrotic  syndrome   . Gastritis     H/o gastritis on prior endoscopy  . Anemia   . Carotid artery disease     Past Surgical History  Procedure Laterality Date  . Cardiac catheterization  07/16/2011  . Incision and drainage breast abscess Left   . Tubal ligation  1994  . Vitrectomy  2010    2 on left, 1 on right  . Cesarean section  1991; 1994  . Video assisted thoracoscopy (vats)/ lobectomy Left 07/30/2011    left main thoracotomy, left lower lobectomy, mediastinal lymph node dissection  . Lobectomy    . Colonoscopy with esophagogastroduodenoscopy (egd) N/A 08/14/2012    Procedure: COLONOSCOPY WITH ESOPHAGOGASTRODUODENOSCOPY (EGD);  Surgeon: Danie Binder, MD;  [facility];  Service: Endoscopy;  Laterality: N/A;  10:45-moved to 1110 [address] to notify pt  . Breast lumpectomy with needle localization Left 11/14/2012    Procedure: BREAST LUMPECTOMY WITH NEEDLE LOCALIZATION;  Surgeon: Marcello Moores A. Cornett, MD;  Location: [place];  Service: General;  Laterality: Left;  . Insertion of dialysis catheter Left 09/12/2013    Procedure: INSERTION OF DIALYSIS CATHETER;  Surgeon: Mal Misty, MD;  Location: [facility];  Service: Vascular;  Laterality: Left;  . Embolectomy Right 09/17/2013    Procedure: Thrombectomy of Right Common Femoral Artery;  Surgeon: Elam Dutch, MD;  Location: [facility];  Service: Vascular;  Laterality: Right;  . Endarterectomy femoral Right 09/17/2013    Procedure: Right Femoral Endarterectomy;  Surgeon: Elam Dutch, MD;  Location: [facility] OR;  Service: Vascular;  Laterality: Right;  . Patch angioplasty Right 09/17/2013    Procedure: Vein Patch Angioplasty of Right Femoral Artery;  Surgeon: Elam Dutch, MD;  Location: [place];  Service: Vascular;  Laterality: Right;  . Fasciotomy Right 09/17/2013    Procedure: Four Compartment Fasciotomy;  Surgeon: Elam Dutch, MD;  Location: [place];  Service: Vascular;  Laterality: Right;  . Fasciotomy closure Right 09/19/2013    Procedure:  FASCIOTOMY CLOSURE;  Surgeon: Elam Dutch, MD;  Location: [place];  Service: Vascular;  Laterality: Right;  regional block and monitored anesthesia care used  . I&d extremity Right 09/28/2013    Procedure: IRRIGATION AND DEBRIDEMENT EXTREMITY;  Surgeon: Serafina Mitchell, MD;  Location: [facility] OR;  Service: Vascular;  Laterality: Right;  . Application of wound vac Right 09/28/2013    Procedure: APPLICATION OF WOUND VAC;  Surgeon: Serafina Mitchell, MD;  Location: [place];  Service: Vascular;  Laterality: Right;    Arthur Holms, RD, LDN Pager #: [phone] After-Hours Pager #: [phone]

## 2013-10-23 NOTE — Progress Notes (Signed)
[place] for Coumadin Indication: Thromboembolic state (RUE DVT)  Allergies  Allergen Reactions  . Crestor [Rosuvastatin] Other (See Comments)    Severe muscle weakness  . Nsaids Other (See Comments)    Not allergic, "bad on my kidneys"  . Ciprofloxacin Rash    Patient Measurements: Height: 5' 7.5" (171.5 cm) Weight: 267 lb 10.2 oz (121.4 kg) IBW/kg (Calculated) : 62.75  Vital Signs: Temp: 98 F (36.7 C) (07/07 0732) Temp src: Oral (07/07 0732) BP: 104/62 mmHg (07/07 0700) Pulse Rate: 100 (07/07 0700)  Labs:  Recent Labs  10/21/13 0425 10/22/13 0400 10/23/13 0334  HGB 7.7* 7.6* 8.1*  HCT 25.0* 24.5* 26.9*  PLT 308 311 346  LABPROT 34.2* 33.2* 25.0*  INR 3.38* 3.26* 2.26*  CREATININE 5.85* 4.67* 3.89*    Estimated Creatinine Clearance: 24.9 ml/min (by C-G formula based on Cr of 3.89).  Assessment: 43yof continues on coumadin for RUE DVT. She has completed 5 days of overlap therapy with argatroban. Argatroban was stopped 6/25 due to therapeutic INR.   INR today remains SUPRAtherapeutic despite dosing held for the past 3 days. INR now trending down rapidly overnight to 2.2. Hgb/Hct low but stable, plts 346. No overt s/sx of bleeding noted. Will give warfarin tonight, patient likely very sensitive.  Goal of Therapy: INR 2-3 Monitor platelets by anticoagulation protocol: Yes   Plan:  1. Warfarin 5mg  tonight to slow decrease 2. Follow-up AM INR 3. Coumadin education eventually once discharge plan more clear.  Erin Hearing PharmD., BCPS Clinical Pharmacist Pager [phone] 10/23/2013 8:26 AM

## 2013-10-23 NOTE — Progress Notes (Signed)
[facility]  Subjective  - Right groin sore   Objective 104/62 100 98 F (36.7 C) (Oral) 10 96%  Intake/Output Summary (Last 24 hours) at 10/23/13 0749 Last data filed at 10/23/13 0700  Gross per 24 hour  Intake  441.6 ml  Output   2875 ml  Net -2433.4 ml   Right groin upper half still with fibrinous exudate and necrotic fat Lower half with good granulation  Assessment/Planning: D/c VAC on groin change to santyl once daily followed by NS wet to dry in evening Continue VAC on fasciotomies No evidence of invasive infection currently so would not necessarily add antibiotics  Hannah [name] E 10/23/2013 7:49 AM --  Laboratory Lab Results:  Recent Labs  10/22/13 0400 10/23/13 0334  WBC 5.3 5.7  HGB 7.6* 8.1*  HCT 24.5* 26.9*  PLT 311 346   BMET  Recent Labs  10/22/13 0400 10/23/13 0334  NA 134* 136*  K 4.4 4.3  CL 95* 96  CO2 28 28  GLUCOSE 144* 154*  BUN 31* 24*  CREATININE 4.67* 3.89*  CALCIUM 8.1* 8.3*    COAG Lab Results  Component Value Date   INR 2.26* 10/23/2013   INR 3.26* 10/22/2013   INR 3.38* 10/21/2013   No results found for this basename: PTT

## 2013-10-23 NOTE — Progress Notes (Signed)
Patient ID: Hannah [name], female   DOB: 09/07/1970, 43 y.o.   MRN: [id number]  [facility] Progress Note   Assessment/ Plan:   1. ESRD- (dialysis dependent since 5/21 and off CVVHD 6/28 and first IHD 6/29) tolerated IHD well yesterday. Plan for dialysis tomorrow (MWF schedule) in a recliner to determine the ability to do OP HD- will also start CLIP process, 2. Cardiogenic shock/ICMP with EF of 22% on dobutamine (now down to 1 mcg/kg/min), midodrine, and florinef. Weight continues to decrease with UF. 3. Anemia of chronic disease- on aranesp 286mcg weekly 4. Right axillary DVT: on coumadin per pharmacy 5. Bilateral carotid stenosis 6. Vascular access- HD via TDC, AVF/AVG when current cardiac instability improves 7. DM- on insulin 8. S/p right femoral endarterectomy/fasciotomy with wound dehiscence and now on vac (episode of vascular insufficiency with cold foot after IABP) 9. 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 VT/VF then PEA arrest 6/3.  Subjective:   No acute events overnight- weaning down dobutamine   Objective:   BP 104/62  Pulse 100  Temp(Src) 98 F (36.7 C) (Oral)  Resp 10  Ht 5' 7.5" (1.715 m)  Wt 121.4 kg (267 lb 10.2 oz)  BMI 41.28 kg/m2  SpO2 96%  LMP 08/19/2013  Physical Exam: YWV:[id number] resting in bed IOE:VOJJK regular tachycardia, normal s1 and s2 Resp:CTA bilaterally, no rales/rhonchi KXF:GHWE, flat, NT, BS normal Ext:2+ LE edema  Labs: BMET  Recent Labs Lab 10/17/13 0425 10/18/13 0500 10/19/13 0355 10/20/13 0440 10/21/13 0425 10/22/13 0400 10/23/13 0334  NA 131* 132* 132* 134* 130* 134* 136*  K 4.6 4.2 4.8 4.3 5.0 4.4 4.3  CL 92* 93* 92* 96 92* 95* 96  CO2 27 26 25 25 24 28 28   GLUCOSE 114* 132* 161* 125* 137* 144* 154*  BUN 40* 36* 49* 33* 42* 31* 24*  CREATININE 5.07* 4.65* 5.85* 4.47* 5.85* 4.67* 3.89*  CALCIUM 8.4 7.9* 8.1* 7.9* 8.3* 8.1* 8.3*  PHOS 5.2* 5.0* 6.2* 4.4 5.4* 4.4 3.7   CBC  Recent Labs Lab  10/20/13 0440 10/21/13 0425 10/22/13 0400 10/23/13 0334  WBC 5.1 5.2 5.3 5.7  HGB 7.7* 7.7* 7.6* 8.1*  HCT 24.6* 25.0* 24.5* 26.9*  MCV 90.8 90.9 91.4 92.4  PLT 285 308 311 346   Medications:    . alteplase  2 mg Intracatheter Once  . aspirin  81 mg Oral Daily  . atorvastatin  80 mg Oral q1800  . calcitRIOL  0.25 mcg Oral Daily  . darbepoetin (ARANESP) injection - NON-DIALYSIS  200 mcg Subcutaneous Q Wed-1800  . feeding supplement (NEPRO CARB STEADY)  237 mL Oral TID WC  . fludrocortisone  0.1 mg Oral Daily  . insulin aspart  0-15 Units Subcutaneous TID WC  . insulin aspart  0-5 Units Subcutaneous QHS  . insulin aspart  4 Units Subcutaneous TID WC  . insulin glargine  20 Units Subcutaneous QHS  . levETIRAcetam  1,000 mg Oral BID  . lidocaine (PF)  5 mL Other Once  . midodrine  10 mg Oral TID WC  . pantoprazole  40 mg Oral Daily  . polyethylene glycol  17 g Oral Daily  . sodium chloride  10-40 mL Intracatheter Q12H  . Warfarin - Pharmacist Dosing Inpatient   Does not apply [id number]   Elmarie Shiley, MD 10/23/2013, 7:29 AM

## 2013-10-23 NOTE — Progress Notes (Signed)
Physical Therapy Treatment Patient Details Name: Hannah [name] MRN: [id number] DOB: Jun 25, 1970 Today's Date: 10/23/2013    History of Present Illness 43 yo WF with  DM, advanced CKD 5 at baseline.  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 Developed cardiogenic shock, cold foot after IABP requiring femoral thrombectomy and fasciotomy (6/1), VT/VF then PEA arrest 6/3. She required initiation of RRT 09/06/13 and has remained dialysis dependent.  Short trial of IHD unsuccessful and back to CRRT as of 6/4 with  inotrope requirement and inability to wean. Pt transitioned to intermittent HD as of 6/29.Marland Kitchen    PT Comments    Pt with improved mobility today and no myoclonic jerks. Feel if pt's family can provide 24 hour assist at home from PT standpoint pt will be able to return home.  Follow Up Recommendations  Home health PT;Supervision/Assistance - 24 hour     Equipment Recommendations  Rolling walker with 5" wheels;Wheelchair (measurements PT)    Recommendations for Other Services       Precautions / Restrictions Precautions Precautions: Fall Precaution Comments: Has had myoclonic jerks with buckling resulting in fall. Currently myoclonic jerks much improved.    Mobility  Bed Mobility Overal bed mobility: Needs Assistance Bed Mobility: Supine to Sit     Supine to sit: Supervision;HOB elevated     General bed mobility comments: Incr time and use of rail  Transfers Overall transfer level: Needs assistance Equipment used: Rolling walker (2 wheeled) Transfers: Sit to/from Stand Sit to Stand: Min assist         General transfer comment: Verbal cues for hand placement and assist to bring hips up.  Ambulation/Gait Ambulation/Gait assistance: Min assist;+2 safety/equipment Ambulation Distance (Feet): 140 Feet Assistive device: Rolling walker (2 wheeled) Gait Pattern/deviations: Step-through pattern;Decreased step length  - right;Decreased step length - left;Decreased stance time - right Gait velocity: decr Gait velocity interpretation: Below normal speed for age/gender General Gait Details: Pt with decr stance time on rt due to tightness in rt calf.   Stairs            Wheelchair Mobility    Modified Rankin (Stroke Patients Only)       Balance   Sitting-balance support: No upper extremity supported;Feet supported Sitting balance-Leahy Scale: Good     Standing balance support: Bilateral upper extremity supported Standing balance-Leahy Scale: Poor Standing balance comment: Requires support of walker for standing                    Cognition Arousal/Alertness: Awake/alert Behavior During Therapy: WFL for tasks assessed/performed Overall Cognitive Status: Within Functional Limits for tasks assessed                      Exercises      General Comments        Pertinent Vitals/Pain VSS    Home Living                      Prior Function            PT Goals (current goals can now be found in the care plan section) Progress towards PT goals: Progressing toward goals    Frequency  Min 3X/week    PT Plan Current plan remains appropriate    Co-evaluation             End of Session Equipment Utilized During  Treatment: Gait belt Activity Tolerance: Patient tolerated treatment well Patient left: in chair;with call bell/phone within reach     Time: 0932-1005 PT Time Calculation (min): 33 min  Charges:  $Gait Training: 23-37 mins                    G Codes:      Hannah [name] 2013-11-21, 11:32 AM  Suanne Marker PT [phone]

## 2013-10-23 NOTE — Progress Notes (Signed)
Patient ID: Hannah [name], female   DOB: April 06, 1971, 43 y.o.   MRN: [id number]  ADVANCED HEART FAILURE ROUNDING NOTE   SUBJECTIVE  43 yo female with PMH of DM1, stage V kidney disease, blindness, h/o lung CA s/p resection who was recently discharged for heart failure symptom had a high risk stress test which showed ischemia in anterior and apical region. She underwent cath 09/04/2013 which showed chronically occluded RCA and triple vessel dx. CT surgery consulted but not felt to be surgical candidate due to poor PFTs, suboptimal targets, comorbidities and shock. EF 22% by Myoview. 30-35% by echo  Underwent placement of swan and IABP for shock (co-ox 38%). Unfortunately developed cold foot and IABP had to be removed.Underwent R femoral thrombectomy and 4 compartment fasciotomy of RLE on 6/1.On 6/3 had VT/VF->PEA arrest 10 mins CPR. U/s shows R axillary DVT.   Now on intermittent HD. Dobutamine decreased to 1.0  CO-OX 58%. Weight up slightly. Denies CP or SOB.  Vascular removed wound vac. Switching to Santyl dressings.     CURRENT MEDS . alteplase  2 mg Intracatheter Once  . aspirin  81 mg Oral Daily  . atorvastatin  80 mg Oral q1800  . calcitRIOL  0.25 mcg Oral Daily  . collagenase   Topical Daily  . darbepoetin (ARANESP) injection - NON-DIALYSIS  200 mcg Subcutaneous Q Wed-1800  . feeding supplement (NEPRO CARB STEADY)  237 mL Oral TID WC  . fludrocortisone  0.1 mg Oral Daily  . insulin aspart  0-15 Units Subcutaneous TID WC  . insulin aspart  0-5 Units Subcutaneous QHS  . insulin aspart  4 Units Subcutaneous TID WC  . insulin glargine  20 Units Subcutaneous QHS  . levETIRAcetam  1,000 mg Oral BID  . lidocaine (PF)  5 mL Other Once  . midodrine  10 mg Oral TID WC  . pantoprazole  40 mg Oral Daily  . polyethylene glycol  17 g Oral Daily  . sodium chloride  10-40 mL Intracatheter Q12H  . Warfarin - Pharmacist Dosing Inpatient   Does not apply q1800    OBJECTIVE  Filed Vitals:    10/23/13 0900 10/23/13 1000 10/23/13 1100 10/23/13 1136  BP: 112/73 115/81 106/69   Pulse: 105 108 98   Temp:    98.4 F (36.9 C)  TempSrc:    Oral  Resp: 18 16 13    Height:      Weight:      SpO2: 99% 99% 97%     Intake/Output Summary (Last 24 hours) at 10/23/13 1149 Last data filed at 10/23/13 1100  Gross per 24 hour  Intake  784.4 ml  Output     75 ml  Net  709.4 ml   Filed Weights   10/22/13 0810 10/22/13 1140 10/23/13 0538  Weight: 121.7 kg (268 lb 4.8 oz) 119.5 kg (263 lb 7.2 oz) 121.4 kg (267 lb 10.2 oz)    PHYSICAL EXAM  CVP 14 General: Lying in bed.  NAD Neuro: Alert and oriented X 3. HEENT:  Normal  Neck: Supple without bruits. Chest L subclav PC. R subclav TLC. JVP up Lungs:  clear Heart: tachy regular.  Abdomen: Soft, non-tender, non-distended, BS + x 4.  Extremities: 2+ edema bilateral ankles. R groin and RLE wounds  CBC  Recent Labs  10/22/13 0400 10/23/13 0334  WBC 5.3 5.7  HGB 7.6* 8.1*  HCT 24.5* 26.9*  MCV 91.4 92.4  PLT 311 902   Basic Metabolic Panel  Recent Labs  10/22/13 0400 10/23/13 0334  NA 134* 136*  K 4.4 4.3  CL 95* 96  CO2 28 28  GLUCOSE 144* 154*  BUN 31* 24*  CREATININE 4.67* 3.89*  CALCIUM 8.1* 8.3*  MG 2.1 2.0  PHOS 4.4 3.7   Liver Function Tests  Recent Labs  10/22/13 0400 10/23/13 0334  ALBUMIN 2.2* 2.3*   Cardiac Enzymes No results found for this basename: CKTOTAL, CKMB, CKMBINDEX, TROPONINI,  in the last 72 hours Thyroid Function Tests No results found for this basename: TSH, T4TOTAL, FREET3, T3FREE, THYROIDAB,  in the last 72 hours  TELE  Sinus tachycardia 100-110   Radiology/Studies  Dg Chest 2 View  09/06/2013   CLINICAL DATA:  CHF, intermittent dyspnea  EXAM: CHEST  2 VIEW  COMPARISON:  Prior chest x-ray 08/13/2013; prior chest CT 08/22/2013  FINDINGS: Stable cardiac and mediastinal contours. Atherosclerotic calcifications are present within the transverse aorta. Moderate left and small right  pleural effusions are similar compared to prior. There is persistent associated bibasilar atelectasis. Mild pulmonary vascular congestion without overt edema. No pneumothorax. No new focal airspace consolidation. No acute osseous abnormality.  IMPRESSION: 1. Stable moderate left and small right pleural effusions and associated bibasilar atelectasis. 2. Pulmonary vascular congestion without overt edema.   Electronically Signed   By: Jacqulynn Cadet M.D.   On: 09/06/2013 07:55   Ct Chest Wo Contrast  08/22/2013   CLINICAL DATA:  Left lung cancer status post lobectomy. Weight gain. Renal insufficiency.  EXAM: CT CHEST WITHOUT CONTRAST  TECHNIQUE: Multidetector CT imaging of the chest was performed following the standard protocol without IV contrast.  COMPARISON:  Radiographs 08/13/2013 and 08/04/2013.  CT 08/02/2012.  FINDINGS: There are stable postsurgical changes related to prior left lower lobe resection. There has been interval enlargement of several mediastinal lymph nodes. These include 12 mm right paratracheal (image 19), 13 mm precarinal (image 23), and 14 mm AP window (image 25) lymph nodes. Some of these nodes have retained fatty hila. Allowing for the limitations of noncontrast technique, the hila appear stable.  There is stable low-density within the right thyroid lobe. Atherosclerosis of the aorta, great vessels and coronary arteries is noted. The heart size is normal. There is no pericardial effusion.  Moderate size dependent pleural effusions are present bilaterally. There is associated dependent airspace disease in both lower lobes. In addition, there are more focal airspace opacities within the right lower lobe and inferior aspect of the left upper lobe. The latter is somewhat nodular, measuring up to 1.8 cm on image 43. No other focal nodularity or endobronchial lesions are demonstrated.  The visualized upper abdomen has a stable appearance. There is no adrenal mass. There is increased  subcutaneous edema throughout the subcutaneous fat, especially within the anterior aspect of the upper abdomen.  No worrisome osseous findings are demonstrated.  IMPRESSION: 1. As demonstrated radiographically, there are bilateral pleural effusions and bilateral airspace opacities which are new compared with the prior CT. Although there are focal somewhat nodular components in the right lower and left upper lobes, these findings are most likely infectious/inflammatory. 2. Progressive mediastinal lymphadenopathy. Some of the nodes have retained fatty hila and may be reactive. Metastatic disease cannot be completely excluded. 3. Stable atherosclerosis and low-density within the right thyroid lobe. 4. If the airspace opacities and pleural effusions fail to respond to appropriate clinical therapy, follow-up CT or PET-CT may be warranted to exclude metastatic disease.   Electronically Signed   By: Modesta Messing.D.  On: 08/22/2013 10:26   Dg Chest Portable 1 View  08/13/2013   CLINICAL DATA:  Shortness of breath increasing for 2 weeks. History of CHF.  EXAM: PORTABLE CHEST - 1 VIEW  COMPARISON:  08/04/2013  FINDINGS: Again noted are bibasilar lung densities that are suggestive for pleural effusions and atelectasis. Heart size appears to be enlarged. There is some peribronchial thickening and cannot exclude pulmonary edema.  IMPRESSION: Bilateral pleural effusions with basilar atelectasis. There is mild pulmonary edema. Minimal change from the previous examination.   Electronically Signed   By: Markus Daft M.D.   On: 08/13/2013 23:10    ASSESSMENT AND PLAN 1. Cardiogenic shock 2. Acute on chronic systolic HF     EF ~67-89% 3. 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 Acute respiratory failure 5. 3-V CAD, severe.  6. Carotid stenosis     - doppler 08/31/2013 shows R 60-79% ICA stenosis, L 80-99% ICA stenosis     - vascular surgery aware 7. Lung CA  h/o LLL lobectomy (2 yr ago due to non-small cell carcinoma)      - PFT  09/05/2013 pre-FEV1 28%, pre-FVC 34%, pre-FEV1/FVC ratio 68 8. DM1 9. Ischemic R foot s/p R femoral thrombectomy and 4-compartment fasciotomy.  10. VT/VF arrest 6/3 11. R axillary DVT 12. Severe protein calorie malnutrition  13. Large right groin wound s/p debridement 6/12 14. Anemia: Transfuse hemoglobin < 7.  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 Stop dobutamine today and follow co-ox. Continue HD per Renal. (although HIT panel negative clinically VERY strong suspicion for HIT-Thrombosis with severe consequences and would avoid heparin if possible)  Wound care per [facility]VS.   Continue keppra for myoclonus  Warfarin dosing d/w Pharmacy.   Continue to manage HF and CAD medically. Therapy limited by hypotension.   Transfer to SDU. Continue PT.   Markeeta Scalf,MD 11:49 AM

## 2013-10-24 LAB — RENAL FUNCTION PANEL
ANION GAP: 14 (ref 5–15)
Albumin: 2.3 g/dL — ABNORMAL LOW (ref 3.5–5.2)
BUN: 42 mg/dL — ABNORMAL HIGH (ref 6–23)
CHLORIDE: 94 meq/L — AB (ref 96–112)
CO2: 26 meq/L (ref 19–32)
Calcium: 8.7 mg/dL (ref 8.4–10.5)
Creatinine, Ser: 5.35 mg/dL — ABNORMAL HIGH (ref 0.50–1.10)
GFR calc Af Amer: 10 mL/min — ABNORMAL LOW (ref >90)
GFR calc non Af Amer: 9 mL/min — ABNORMAL LOW (ref >90)
GLUCOSE: 138 mg/dL — AB (ref 70–99)
POTASSIUM: 4.9 meq/L (ref 3.7–5.3)
Phosphorus: 4.7 mg/dL — ABNORMAL HIGH (ref 2.3–4.6)
Sodium: 134 mEq/L — ABNORMAL LOW (ref 137–147)

## 2013-10-24 LAB — CARBOXYHEMOGLOBIN
CARBOXYHEMOGLOBIN: 1.9 % — AB (ref 0.5–1.5)
METHEMOGLOBIN: 0.1 % (ref 0.0–1.5)
O2 Saturation: 49.1 %
TOTAL HEMOGLOBIN: 8 g/dL — AB (ref 12.0–16.0)

## 2013-10-24 LAB — PROTIME-INR
INR: 2.15 — ABNORMAL HIGH (ref 0.00–1.49)
Prothrombin Time: 24 seconds — ABNORMAL HIGH (ref 11.6–15.2)

## 2013-10-24 LAB — GLUCOSE, CAPILLARY
GLUCOSE-CAPILLARY: 146 mg/dL — AB (ref 70–99)
Glucose-Capillary: 152 mg/dL — ABNORMAL HIGH (ref 70–99)
Glucose-Capillary: 116 mg/dL — ABNORMAL HIGH (ref 70–99)
Glucose-Capillary: 240 mg/dL — ABNORMAL HIGH (ref 70–99)

## 2013-10-24 LAB — CBC
HCT: 26.5 % — ABNORMAL LOW (ref 36.0–46.0)
HEMOGLOBIN: 8 g/dL — AB (ref 12.0–15.0)
MCH: 27.8 pg (ref 26.0–34.0)
MCHC: 30.2 g/dL (ref 30.0–36.0)
MCV: 92 fL (ref 78.0–100.0)
PLATELETS: 356 10*3/uL (ref 150–400)
RBC: 2.88 MIL/uL — AB (ref 3.87–5.11)
RDW: 17.3 % — ABNORMAL HIGH (ref 11.5–15.5)
WBC: 6.9 10*3/uL (ref 4.0–10.5)

## 2013-10-24 LAB — MAGNESIUM: Magnesium: 2.1 mg/dL (ref 1.5–2.5)

## 2013-10-24 MED ORDER — FENTANYL CITRATE 0.05 MG/ML IJ SOLN
INTRAMUSCULAR | Status: AC
  Administered 2013-10-24: 100 ug via INTRAVENOUS
  Filled 2013-10-24: qty 2

## 2013-10-24 MED ORDER — DARBEPOETIN ALFA-POLYSORBATE 200 MCG/0.4ML IJ SOLN
INTRAMUSCULAR | Status: AC
Start: 2013-10-24 — End: 2013-10-24
  Filled 2013-10-24: qty 0.4

## 2013-10-24 MED ORDER — WARFARIN SODIUM 5 MG PO TABS
5.0000 mg | ORAL_TABLET | Freq: Once | ORAL | Status: AC
  Administered 2013-10-24: 5 mg via ORAL
  Filled 2013-10-24 (×3): qty 1

## 2013-10-24 NOTE — Progress Notes (Signed)
PT Cancellation Note  Patient Details Name: Hannah [name] MRN: [id number] DOB: 10/31/1970   Cancelled Treatment:    Reason Eval/Treat Not Completed: Patient at procedure or test/unavailable (HD).   Kayle Passarelli 10/24/2013, 9:51 AM

## 2013-10-24 NOTE — Progress Notes (Signed)
Patient ID: Hannah [name], female   DOB: 08-05-1970, 43 y.o.   MRN: [id number]   [facility] Progress Note   Assessment/ Plan:   1. ESRD- (dialysis dependent since 5/21) continuing dialysis on a Monday/Wednesday/Friday schedule-anticipate improvement of volume status will improve co-oximetry. The process has been started to find her an outpatient dialysis unit. Currently dialyzing via tunneled dialysis catheter (permanent access deferred until cardiac status more stable). 2. Cardiogenic shock/ICMP with EF of 22% now off dobutamine: Remains on midodrine and florinef (efficacy and clear in ESRD). Weight continues to decrease with UF. 3. Anemia of chronic disease- on aranesp 237mcg weekly 4. Right axillary DVT: on coumadin per pharmacy 5. Bilateral carotid stenosis 6. Vascular access- HD via TDC, AVF/AVG when current cardiac instability improves 7. DM- on insulin 8. S/p right femoral endarterectomy/fasciotomy with wound dehiscence and now on vac (episode of vascular insufficiency with cold foot after IABP) 9. 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 VT/VF then PEA arrest 6/3.  Subjective:   Dobutamine discontinued overnight-doing well but with decreased co-oximetry this morning. Awaiting dialysis    Objective:   BP 114/76  Pulse 106  Temp(Src) 98.4 F (36.9 C) (Oral)  Resp 25  Ht 5' 7.5" (1.715 m)  Wt 124.8 kg (275 lb 2.2 oz)  BMI 42.43 kg/m2  SpO2 100%  LMP 08/19/2013  Physical Exam: Gen: Comfortably sitting up in recliner CVS: Pulse regular tachycardia, S1 and S2 normal Resp: Coarse breath sounds bilaterally-no rales/rhonchi Abd: Soft, obese, nontender and bowel sounds normal Ext: 2+ lower extremity edema  Labs: BMET  Recent Labs Lab 10/18/13 0500 10/19/13 0355 10/20/13 0440 10/21/13 0425 10/22/13 0400 10/23/13 0334 10/24/13 0434  NA 132* 132* 134* 130* 134* 136* 134*  K 4.2 4.8 4.3 5.0 4.4 4.3 4.9  CL 93* 92* 96 92* 95* 96 94*  CO2 26 25 25 24  28 28 26   GLUCOSE 132* 161* 125* 137* 144* 154* 138*  BUN 36* 49* 33* 42* 31* 24* 42*  CREATININE 4.65* 5.85* 4.47* 5.85* 4.67* 3.89* 5.35*  CALCIUM 7.9* 8.1* 7.9* 8.3* 8.1* 8.3* 8.7  PHOS 5.0* 6.2* 4.4 5.4* 4.4 3.7 4.7*   CBC  Recent Labs Lab 10/21/13 0425 10/22/13 0400 10/23/13 0334 10/24/13 0434  WBC 5.2 5.3 5.7 6.9  HGB 7.7* 7.6* 8.1* 8.0*  HCT 25.0* 24.5* 26.9* 26.5*  MCV 90.9 91.4 92.4 92.0  PLT 308 311 346 356   Medications:    . alteplase  2 mg Intracatheter Once  . aspirin  81 mg Oral Daily  . atorvastatin  80 mg Oral q1800  . calcitRIOL  0.25 mcg Oral Daily  . collagenase   Topical Daily  . darbepoetin (ARANESP) injection - NON-DIALYSIS  200 mcg Subcutaneous Q Wed-1800  . feeding supplement (NEPRO CARB STEADY)  237 mL Oral TID WC  . fludrocortisone  0.1 mg Oral Daily  . insulin aspart  0-15 Units Subcutaneous TID WC  . insulin aspart  0-5 Units Subcutaneous QHS  . insulin aspart  4 Units Subcutaneous TID WC  . insulin glargine  20 Units Subcutaneous QHS  . levETIRAcetam  1,000 mg Oral BID  . lidocaine (PF)  5 mL Other Once  . midodrine  10 mg Oral TID WC  . pantoprazole  40 mg Oral Daily  . polyethylene glycol  17 g Oral Daily  . sodium chloride  10-40 mL Intracatheter Q12H  . Warfarin - Pharmacist Dosing Inpatient   Does not apply [id number]   Elmarie Shiley, MD  10/24/2013, 7:38 AM

## 2013-10-24 NOTE — Progress Notes (Signed)
[facility] Indication: Thromboembolic state (RUE DVT)  Allergies  Allergen Reactions  . Crestor [Rosuvastatin] Other (See Comments)    Severe muscle weakness  . Nsaids Other (See Comments)    Not allergic, "bad on my kidneys"  . Ciprofloxacin Rash    Patient Measurements: Height: 5' 7.5" (171.5 cm) Weight: 275 lb 2.2 oz (124.8 kg) IBW/kg (Calculated) : 62.75   Vital Signs: Temp: 98.4 F (36.9 C) (07/07 2353) Temp src: Oral (07/07 2353) BP: 114/76 mmHg (07/08 0600) Pulse Rate: 106 (07/08 0619)  Labs:  Recent Labs  10/22/13 0400 10/23/13 0334 10/24/13 0434  HGB 7.6* 8.1* 8.0*  HCT 24.5* 26.9* 26.5*  PLT 311 346 356  LABPROT 33.2* 25.0* 24.0*  INR 3.26* 2.26* 2.15*  CREATININE 4.67* 3.89* 5.35*    Estimated Creatinine Clearance: 18.4 ml/min (by C-G formula based on Cr of 5.35).  Assessment: 43yof continues on coumadin for RUE DVT. She has completed 5 days of overlap therapy with argatroban. Argatroban was stopped 6/25 due to therapeutic INR.   INR today remains therapeutic. INR continues to trend down but slowing to to 2.1. Hgb/Hct low but stable, plts 356. No overt s/sx of bleeding noted. Will give warfarin tonight, patient likely very sensitive.  Goal of Therapy: INR 2-3 Monitor platelets by anticoagulation protocol: Yes   Plan:  1. Warfarin 5mg  tonight  2. Follow-up AM INR 3. Coumadin education eventually once discharge plan more clear.  Erin Hearing PharmD., BCPS Clinical Pharmacist Pager [phone] 10/24/2013 7:19 AM

## 2013-10-24 NOTE — Progress Notes (Signed)
Patient ID: Hannah [name], female   DOB: [birth year], 43 y.o.   MRN: [id number]  ADVANCED HEART FAILURE ROUNDING NOTE   SUBJECTIVE  43 yo female with PMH of DM1, stage V kidney disease, blindness, h/o lung CA s/p resection who was recently discharged for heart failure symptom had a high risk stress test which showed ischemia in anterior and apical region. She underwent cath 09/04/2013 which showed chronically occluded RCA and triple vessel dx. CT surgery consulted but not felt to be surgical candidate due to poor PFTs, suboptimal targets, comorbidities and shock. EF 22% by Myoview. 30-35% by echo  Underwent placement of swan and IABP for shock (co-ox 38%). Unfortunately developed cold foot and IABP had to be removed.Underwent R femoral thrombectomy and 4 compartment fasciotomy of RLE on 6/1.On 6/3 had VT/VF->PEA arrest 10 mins CPR. U/s shows R axillary DVT.   Now on intermittent HD. Dobutamine stopped yesterday.  CO-OX down to 49%. Weight up slightly. Denies CP or SOB.  Vascular removed wound vac from groin. Switching to Santyl dressings. Now on HD    CURRENT MEDS . alteplase  2 mg Intracatheter Once  . aspirin  81 mg Oral Daily  . atorvastatin  80 mg Oral q1800  . calcitRIOL  0.25 mcg Oral Daily  . collagenase   Topical Daily  . darbepoetin (ARANESP) injection - NON-DIALYSIS  200 mcg Subcutaneous Q Wed-1800  . feeding supplement (NEPRO CARB STEADY)  237 mL Oral TID WC  . fludrocortisone  0.1 mg Oral Daily  . insulin aspart  0-15 Units Subcutaneous TID WC  . insulin aspart  0-5 Units Subcutaneous QHS  . insulin aspart  4 Units Subcutaneous TID WC  . insulin glargine  20 Units Subcutaneous QHS  . levETIRAcetam  1,000 mg Oral BID  . lidocaine (PF)  5 mL Other Once  . midodrine  10 mg Oral TID WC  . pantoprazole  40 mg Oral Daily  . polyethylene glycol  17 g Oral Daily  . sodium chloride  10-40 mL Intracatheter Q12H  . Warfarin - Pharmacist Dosing Inpatient   Does not apply q1800     OBJECTIVE  Filed Vitals:   10/24/13 0811 10/24/13 0814 10/24/13 0830 10/24/13 0900  BP: 133/66 126/70 115/56 112/34  Pulse: 100 101 93 81  Temp: 97.4 F (36.3 C)     TempSrc: Oral     Resp: 23 14 12 14   Height:      Weight: 124.8 kg (275 lb 2.2 oz)     SpO2: 96%       Intake/Output Summary (Last 24 hours) at 10/24/13 0950 Last data filed at 10/24/13 0700  Gross per 24 hour  Intake  995.1 ml  Output      0 ml  Net  995.1 ml   Filed Weights   10/23/13 0538 10/24/13 0619 10/24/13 0811  Weight: 121.4 kg (267 lb 10.2 oz) 124.8 kg (275 lb 2.2 oz) 124.8 kg (275 lb 2.2 oz)    PHYSICAL EXAM  General: Sitting in HD chair.  NAD Neuro: Alert and oriented X 3. HEENT:  Normal  Neck: Supple without bruits. Chest L subclav PC. R subclav TLC. JVP up Lungs:  clear Heart: tachy regular.  Abdomen: Soft, non-tender, non-distended, BS + x 4.  Extremities: 2+ edema bilateral ankles. R groin and RLE wounds  CBC  Recent Labs  10/23/13 0334 10/24/13 0434  WBC 5.7 6.9  HGB 8.1* 8.0*  HCT 26.9* 26.5*  MCV 92.4 92.0  PLT  346 893   Basic Metabolic Panel  Recent Labs  10/23/13 0334 10/24/13 0434  NA 136* 134*  K 4.3 4.9  CL 96 94*  CO2 28 26  GLUCOSE 154* 138*  BUN 24* 42*  CREATININE 3.89* 5.35*  CALCIUM 8.3* 8.7  MG 2.0 2.1  PHOS 3.7 4.7*   Liver Function Tests  Recent Labs  10/23/13 0334 10/24/13 0434  ALBUMIN 2.3* 2.3*   Cardiac Enzymes No results found for this basename: CKTOTAL, CKMB, CKMBINDEX, TROPONINI,  in the last 72 hours Thyroid Function Tests No results found for this basename: TSH, T4TOTAL, FREET3, T3FREE, THYROIDAB,  in the last 72 hours  TELE  Sinus tachycardia 100-110   Radiology/Studies  Dg Chest 2 View  09/06/2013   CLINICAL DATA:  CHF, intermittent dyspnea  EXAM: CHEST  2 VIEW  COMPARISON:  Prior chest x-ray 08/13/2013; prior chest CT 08/22/2013  FINDINGS: Stable cardiac and mediastinal contours. Atherosclerotic calcifications are  present within the transverse aorta. Moderate left and small right pleural effusions are similar compared to prior. There is persistent associated bibasilar atelectasis. Mild pulmonary vascular congestion without overt edema. No pneumothorax. No new focal airspace consolidation. No acute osseous abnormality.  IMPRESSION: 1. Stable moderate left and small right pleural effusions and associated bibasilar atelectasis. 2. Pulmonary vascular congestion without overt edema.   Electronically Signed   By: Jacqulynn Cadet M.D.   On: 09/06/2013 07:55   Ct Chest Wo Contrast  08/22/2013   CLINICAL DATA:  Left lung cancer status post lobectomy. Weight gain. Renal insufficiency.  EXAM: CT CHEST WITHOUT CONTRAST  TECHNIQUE: Multidetector CT imaging of the chest was performed following the standard protocol without IV contrast.  COMPARISON:  Radiographs 08/13/2013 and 08/04/2013.  CT 08/02/2012.  FINDINGS: There are stable postsurgical changes related to prior left lower lobe resection. There has been interval enlargement of several mediastinal lymph nodes. These include 12 mm right paratracheal (image 19), 13 mm precarinal (image 23), and 14 mm AP window (image 25) lymph nodes. Some of these nodes have retained fatty hila. Allowing for the limitations of noncontrast technique, the hila appear stable.  There is stable low-density within the right thyroid lobe. Atherosclerosis of the aorta, great vessels and coronary arteries is noted. The heart size is normal. There is no pericardial effusion.  Moderate size dependent pleural effusions are present bilaterally. There is associated dependent airspace disease in both lower lobes. In addition, there are more focal airspace opacities within the right lower lobe and inferior aspect of the left upper lobe. The latter is somewhat nodular, measuring up to 1.8 cm on image 43. No other focal nodularity or endobronchial lesions are demonstrated.  The visualized upper abdomen has a stable  appearance. There is no adrenal mass. There is increased subcutaneous edema throughout the subcutaneous fat, especially within the anterior aspect of the upper abdomen.  No worrisome osseous findings are demonstrated.  IMPRESSION: 1. As demonstrated radiographically, there are bilateral pleural effusions and bilateral airspace opacities which are new compared with the prior CT. Although there are focal somewhat nodular components in the right lower and left upper lobes, these findings are most likely infectious/inflammatory. 2. Progressive mediastinal lymphadenopathy. Some of the nodes have retained fatty hila and may be reactive. Metastatic disease cannot be completely excluded. 3. Stable atherosclerosis and low-density within the right thyroid lobe. 4. If the airspace opacities and pleural effusions fail to respond to appropriate clinical therapy, follow-up CT or PET-CT may be warranted to exclude metastatic disease.  Electronically Signed   By: Camie Patience M.D.   On: 08/22/2013 10:26   Dg Chest Portable 1 View  08/13/2013   CLINICAL DATA:  Shortness of breath increasing for 2 weeks. History of CHF.  EXAM: PORTABLE CHEST - 1 VIEW  COMPARISON:  08/04/2013  FINDINGS: Again noted are bibasilar lung densities that are suggestive for pleural effusions and atelectasis. Heart size appears to be enlarged. There is some peribronchial thickening and cannot exclude pulmonary edema.  IMPRESSION: Bilateral pleural effusions with basilar atelectasis. There is mild pulmonary edema. Minimal change from the previous examination.   Electronically Signed   By: Markus Daft M.D.   On: 08/13/2013 23:10    ASSESSMENT AND PLAN 1. Cardiogenic shock 2. Acute on chronic systolic HF     EF ~81-82% 3. 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 Acute respiratory failure 5. 3-V CAD, severe.  6. Carotid stenosis     - doppler 08/31/2013 shows R 60-79% ICA stenosis, L 80-99% ICA stenosis     - vascular surgery aware 7. Lung CA  h/o LLL lobectomy (2 yr  ago due to non-small cell carcinoma)      - PFT 09/05/2013 pre-FEV1 28%, pre-FVC 34%, pre-FEV1/FVC ratio 68 8. DM1 9. Ischemic R foot s/p R femoral thrombectomy and 4-compartment fasciotomy.  10. VT/VF arrest 6/3 11. R axillary DVT 12. Severe protein calorie malnutrition  13. Large right groin wound s/p debridement 6/12 14. Anemia: Transfuse hemoglobin < 7.  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 Co-ox down slightly but tolerating. Will continue to follow. (although HIT panel negative clinically VERY strong suspicion for HIT-Thrombosis with severe consequences and would avoid heparin if possible)  Wound care per [facility].   Continue keppra for myoclonus  Warfarin dosing d/w Pharmacy.   Continue to manage HF and CAD medically. Therapy limited by hypotension.   Transfer to SDU. Continue PT.   Daniel Bensimhon,MD 9:50 AM

## 2013-10-24 NOTE — Procedures (Signed)
Patient seen on Hemodialysis. QB 400, UF goal 3.8L Treatment adjusted as needed.  Elmarie Shiley MD [facility]. Office # [phone] Pager # [phone] 8:48 AM

## 2013-10-24 NOTE — Progress Notes (Signed)
Pt transferred to [facility], report called. Pt stable at time of transfer. All belongings sent with patient at time of transfer

## 2013-10-25 LAB — GLUCOSE, CAPILLARY
GLUCOSE-CAPILLARY: 100 mg/dL — AB (ref 70–99)
GLUCOSE-CAPILLARY: 176 mg/dL — AB (ref 70–99)
GLUCOSE-CAPILLARY: 257 mg/dL — AB (ref 70–99)
GLUCOSE-CAPILLARY: 276 mg/dL — AB (ref 70–99)
Glucose-Capillary: 214 mg/dL — ABNORMAL HIGH (ref 70–99)

## 2013-10-25 LAB — PROTIME-INR
INR: 1.92 — ABNORMAL HIGH (ref 0.00–1.49)
Prothrombin Time: 22 seconds — ABNORMAL HIGH (ref 11.6–15.2)

## 2013-10-25 LAB — CARBOXYHEMOGLOBIN
CARBOXYHEMOGLOBIN: 2.2 % — AB (ref 0.5–1.5)
Methemoglobin: 0.8 % (ref 0.0–1.5)
O2 SAT: 64.6 %
Total hemoglobin: 8.3 g/dL — ABNORMAL LOW (ref 12.0–16.0)

## 2013-10-25 LAB — CBC
HEMATOCRIT: 28.1 % — AB (ref 36.0–46.0)
Hemoglobin: 8.5 g/dL — ABNORMAL LOW (ref 12.0–15.0)
MCH: 27.7 pg (ref 26.0–34.0)
MCHC: 30.2 g/dL (ref 30.0–36.0)
MCV: 91.5 fL (ref 78.0–100.0)
PLATELETS: 389 10*3/uL (ref 150–400)
RBC: 3.07 MIL/uL — AB (ref 3.87–5.11)
RDW: 17.3 % — ABNORMAL HIGH (ref 11.5–15.5)
WBC: 6.6 10*3/uL (ref 4.0–10.5)

## 2013-10-25 LAB — RENAL FUNCTION PANEL
ALBUMIN: 2.3 g/dL — AB (ref 3.5–5.2)
Anion gap: 14 (ref 5–15)
BUN: 32 mg/dL — AB (ref 6–23)
CO2: 26 mEq/L (ref 19–32)
CREATININE: 4.1 mg/dL — AB (ref 0.50–1.10)
Calcium: 8.5 mg/dL (ref 8.4–10.5)
Chloride: 93 mEq/L — ABNORMAL LOW (ref 96–112)
GFR calc Af Amer: 14 mL/min — ABNORMAL LOW (ref >90)
GFR, EST NON AFRICAN AMERICAN: 12 mL/min — AB (ref >90)
Glucose, Bld: 106 mg/dL — ABNORMAL HIGH (ref 70–99)
PHOSPHORUS: 3.9 mg/dL (ref 2.3–4.6)
POTASSIUM: 4.4 meq/L (ref 3.7–5.3)
Sodium: 133 mEq/L — ABNORMAL LOW (ref 137–147)

## 2013-10-25 LAB — MAGNESIUM: MAGNESIUM: 2.1 mg/dL (ref 1.5–2.5)

## 2013-10-25 MED ORDER — PATIENT'S GUIDE TO USING COUMADIN BOOK
Freq: Once | Status: AC
  Administered 2013-10-25: 23:00:00
  Filled 2013-10-25: qty 1

## 2013-10-25 MED ORDER — WARFARIN SODIUM 7.5 MG PO TABS
7.5000 mg | ORAL_TABLET | Freq: Once | ORAL | Status: AC
  Administered 2013-10-25: 7.5 mg via ORAL
  Filled 2013-10-25: qty 1

## 2013-10-25 NOTE — Progress Notes (Signed)
Patient ID: Hannah [name], female   DOB: 1970-12-10, 43 y.o.   MRN: [id number]  ADVANCED HEART FAILURE ROUNDING NOTE   SUBJECTIVE  43 yo female with PMH of DM1, stage V kidney disease, blindness, h/o lung CA s/p resection who was recently discharged for heart failure symptom had a high risk stress test which showed ischemia in anterior and apical region. She underwent cath 09/04/2013 which showed chronically occluded RCA and triple vessel dx. CT surgery consulted but not felt to be surgical candidate due to poor PFTs, suboptimal targets, comorbidities and shock. EF 22% by Myoview. 30-35% by echo  Underwent placement of swan and IABP for shock (co-ox 38%). Unfortunately developed cold foot and IABP had to be removed.Underwent R femoral thrombectomy and 4 compartment fasciotomy of RLE on 6/1.On 6/3 had VT/VF->PEA arrest 10 mins CPR. U/s shows R axillary DVT.   Now on intermittent HD. Dobutamine stopped 2 days ago.  CO-OX down to 49% yesterday but back in 60s today. Feels good. Weight coming down.  Denies CP or SOB or dizziness. BP stable.     CURRENT MEDS . alteplase  2 mg Intracatheter Once  . aspirin  81 mg Oral Daily  . atorvastatin  80 mg Oral q1800  . calcitRIOL  0.25 mcg Oral Daily  . collagenase   Topical Daily  . darbepoetin (ARANESP) injection - NON-DIALYSIS  200 mcg Subcutaneous Q Wed-1800  . feeding supplement (NEPRO CARB STEADY)  237 mL Oral TID WC  . fludrocortisone  0.1 mg Oral Daily  . insulin aspart  0-15 Units Subcutaneous TID WC  . insulin aspart  0-5 Units Subcutaneous QHS  . insulin aspart  4 Units Subcutaneous TID WC  . insulin glargine  20 Units Subcutaneous QHS  . levETIRAcetam  1,000 mg Oral BID  . lidocaine (PF)  5 mL Other Once  . midodrine  10 mg Oral TID WC  . pantoprazole  40 mg Oral Daily  . polyethylene glycol  17 g Oral Daily  . sodium chloride  10-40 mL Intracatheter Q12H  . warfarin  7.5 mg Oral ONCE-1800  . Warfarin - Pharmacist Dosing Inpatient    Does not apply q1800    OBJECTIVE  Filed Vitals:   10/25/13 0400 10/25/13 0500 10/25/13 0600 10/25/13 0700  BP: 125/70     Pulse: 94 91 94 92  Temp:      TempSrc:      Resp:  14 9 11   Height:      Weight:      SpO2: 100% 100% 100% 100%    Intake/Output Summary (Last 24 hours) at 10/25/13 0912 Last data filed at 10/25/13 0700  Gross per 24 hour  Intake    240 ml  Output   3085 ml  Net  -2845 ml   Filed Weights   10/24/13 0619 10/24/13 0811 10/25/13 0300  Weight: 124.8 kg (275 lb 2.2 oz) 124.8 kg (275 lb 2.2 oz) 123.2 kg (271 lb 9.7 oz)    PHYSICAL EXAM  General: Sitting in bedNAD Neuro: Alert and oriented X 3. HEENT:  Normal  Neck: Supple without bruits. Chest L subclav PC. R subclav TLC. JVP up Lungs:  clear Heart: tachy regular.  Abdomen: Soft, non-tender, non-distended, BS + x 4.  Extremities: 1-2+ edema bilateral ankles. R groin and RLE wounds  CBC  Recent Labs  10/24/13 0434 10/25/13 0420  WBC 6.9 6.6  HGB 8.0* 8.5*  HCT 26.5* 28.1*  MCV 92.0 91.5  PLT 356 389  Basic Metabolic Panel  Recent Labs  10/24/13 0434 10/25/13 0420  NA 134* 133*  K 4.9 4.4  CL 94* 93*  CO2 26 26  GLUCOSE 138* 106*  BUN 42* 32*  CREATININE 5.35* 4.10*  CALCIUM 8.7 8.5  MG 2.1 2.1  PHOS 4.7* 3.9   Liver Function Tests  Recent Labs  10/24/13 0434 10/25/13 0420  ALBUMIN 2.3* 2.3*   Cardiac Enzymes No results found for this basename: CKTOTAL, CKMB, CKMBINDEX, TROPONINI,  in the last 72 hours Thyroid Function Tests No results found for this basename: TSH, T4TOTAL, FREET3, T3FREE, THYROIDAB,  in the last 72 hours  TELE  Sinus tachycardia 100-110   Radiology/Studies  Dg Chest 2 View  09/06/2013   CLINICAL DATA:  CHF, intermittent dyspnea  EXAM: CHEST  2 VIEW  COMPARISON:  Prior chest x-ray 08/13/2013; prior chest CT 08/22/2013  FINDINGS: Stable cardiac and mediastinal contours. Atherosclerotic calcifications are present within the transverse aorta.  Moderate left and small right pleural effusions are similar compared to prior. There is persistent associated bibasilar atelectasis. Mild pulmonary vascular congestion without overt edema. No pneumothorax. No new focal airspace consolidation. No acute osseous abnormality.  IMPRESSION: 1. Stable moderate left and small right pleural effusions and associated bibasilar atelectasis. 2. Pulmonary vascular congestion without overt edema.   Electronically Signed   By: Jacqulynn Cadet M.D.   On: 09/06/2013 07:55   Ct Chest Wo Contrast  08/22/2013   CLINICAL DATA:  Left lung cancer status post lobectomy. Weight gain. Renal insufficiency.  EXAM: CT CHEST WITHOUT CONTRAST  TECHNIQUE: Multidetector CT imaging of the chest was performed following the standard protocol without IV contrast.  COMPARISON:  Radiographs 08/13/2013 and 08/04/2013.  CT 08/02/2012.  FINDINGS: There are stable postsurgical changes related to prior left lower lobe resection. There has been interval enlargement of several mediastinal lymph nodes. These include 12 mm right paratracheal (image 19), 13 mm precarinal (image 23), and 14 mm AP window (image 25) lymph nodes. Some of these nodes have retained fatty hila. Allowing for the limitations of noncontrast technique, the hila appear stable.  There is stable low-density within the right thyroid lobe. Atherosclerosis of the aorta, great vessels and coronary arteries is noted. The heart size is normal. There is no pericardial effusion.  Moderate size dependent pleural effusions are present bilaterally. There is associated dependent airspace disease in both lower lobes. In addition, there are more focal airspace opacities within the right lower lobe and inferior aspect of the left upper lobe. The latter is somewhat nodular, measuring up to 1.8 cm on image 43. No other focal nodularity or endobronchial lesions are demonstrated.  The visualized upper abdomen has a stable appearance. There is no adrenal mass.  There is increased subcutaneous edema throughout the subcutaneous fat, especially within the anterior aspect of the upper abdomen.  No worrisome osseous findings are demonstrated.  IMPRESSION: 1. As demonstrated radiographically, there are bilateral pleural effusions and bilateral airspace opacities which are new compared with the prior CT. Although there are focal somewhat nodular components in the right lower and left upper lobes, these findings are most likely infectious/inflammatory. 2. Progressive mediastinal lymphadenopathy. Some of the nodes have retained fatty hila and may be reactive. Metastatic disease cannot be completely excluded. 3. Stable atherosclerosis and low-density within the right thyroid lobe. 4. If the airspace opacities and pleural effusions fail to respond to appropriate clinical therapy, follow-up CT or PET-CT may be warranted to exclude metastatic disease.   Electronically Signed  By: Camie Patience M.D.   On: 08/22/2013 10:26   Dg Chest Portable 1 View  08/13/2013   CLINICAL DATA:  Shortness of breath increasing for 2 weeks. History of CHF.  EXAM: PORTABLE CHEST - 1 VIEW  COMPARISON:  08/04/2013  FINDINGS: Again noted are bibasilar lung densities that are suggestive for pleural effusions and atelectasis. Heart size appears to be enlarged. There is some peribronchial thickening and cannot exclude pulmonary edema.  IMPRESSION: Bilateral pleural effusions with basilar atelectasis. There is mild pulmonary edema. Minimal change from the previous examination.   Electronically Signed   By: Markus Daft M.D.   On: 08/13/2013 23:10    ASSESSMENT AND PLAN 1. Cardiogenic shock 2. Acute on chronic systolic HF     EF ~41-28% 3. 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 Acute respiratory failure 5. 3-V CAD, severe.  6. Carotid stenosis     - doppler 08/31/2013 shows R 60-79% ICA stenosis, L 80-99% ICA stenosis     - vascular surgery aware 7. Lung CA  h/o LLL lobectomy (2 yr ago due to non-small cell carcinoma)       - PFT 09/05/2013 pre-FEV1 28%, pre-FVC 34%, pre-FEV1/FVC ratio 68 8. DM1 9. Ischemic R foot s/p R femoral thrombectomy and 4-compartment fasciotomy.  10. VT/VF arrest 6/3 11. R axillary DVT 12. Severe protein calorie malnutrition  13. Large right groin wound s/p debridement 6/12 14. Anemia: Transfuse hemoglobin < 7.  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 Co-ox and BP improved. Tolerating iHD.   I think we can begin to work toward discharge potentially as early as Monday. Important issues will be:  1) Is outpatient HD set up?  2) Can we arrange sufficient outpatient wound care?   Will stop florinef and continue midodrine.   Continue keppra for myoclonus  Warfarin dosing d/w Pharmacy.   Continue to manage HF and CAD medically. Therapy limited by hypotension but may be able to consider getting low-dose therapy on board.   Continue PT.   Daniel Bensimhon,MD 9:12 AM

## 2013-10-25 NOTE — Progress Notes (Signed)
  [facility] Progress Note  10/25/2013 10:15 AM 27 Days Post-Op  Subjective:  Patient with no complaints. Sitting up in bed.   Tmax 98.1 BP sys 80s-130s 02 100% 2L  Filed Vitals:   10/25/13 0700  BP:   Pulse: 92  Temp:   Resp: 11    Physical Exam: Right groin with fibrinous exudate upper half with small line of necrosis at skin edge (60mm x 3 mm). Granulation tissue of the lower half of wound. No odor. Does not appear infected.  Extremities: Wound vac seal intact right leg fasciotomies. Increasing duskiness of right 3rd and 4th toes. Brisk DP doppler signals bilaterally. Motor intact bilaterally, weaker on right. Sensation intact lower extremities bilaterally.    CBC    Component Value Date/Time   WBC 6.6 10/25/2013 0420   RBC 3.07* 10/25/2013 0420   HGB 8.5* 10/25/2013 0420   HCT 28.1* 10/25/2013 0420   PLT 389 10/25/2013 0420   MCV 91.5 10/25/2013 0420   MCH 27.7 10/25/2013 0420   MCHC 30.2 10/25/2013 0420   RDW 17.3* 10/25/2013 0420   LYMPHSABS 1.4 08/04/2013 1223   MONOABS 0.4 08/04/2013 1223   EOSABS 0.2 08/04/2013 1223   BASOSABS 0.1 08/04/2013 1223    BMET    Component Value Date/Time   NA 133* 10/25/2013 0420   K 4.4 10/25/2013 0420   CL 93* 10/25/2013 0420   CO2 26 10/25/2013 0420   GLUCOSE 106* 10/25/2013 0420   BUN 32* 10/25/2013 0420   CREATININE 4.10* 10/25/2013 0420   CREATININE 4.17* 07/06/2013 1618   CALCIUM 8.5 10/25/2013 0420   GFRNONAA 12* 10/25/2013 0420   GFRNONAA 71 07/13/2011 0835   GFRAA 14* 10/25/2013 0420   GFRAA 82 07/13/2011 0835    INR    Component Value Date/Time   INR 1.92* 10/25/2013 0420     Intake/Output Summary (Last 24 hours) at 10/25/13 1015 Last data filed at 10/25/13 0700  Gross per 24 hour  Intake    240 ml  Output   3085 ml  Net  -2845 ml     Assessment/Plan:  Continuing to have good doppler signals of the lower extremities bilaterally.  Continue santyl dressing changes daily and NS wet to dry dressings in evening. May need  further debridement.  Did not see fasciotomy wounds prior to dressing change today. Continue wound vac. Will return on Saturday to assess wounds prior to possible discharge Monday.     Virgina Jock, PA-C [facility] Office: [phone] Pager: [phone] 10/25/2013 10:15 AM

## 2013-10-25 NOTE — Progress Notes (Signed)
Physical Therapy Treatment Patient Details Name: Hannah [name] MRN: [id number] DOB: 06-11-1970 Today's Date: 10/25/2013    History of Present Illness 43 yo WF with  DM, advanced CKD 5 at baseline.  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 Developed cardiogenic shock, cold foot after IABP requiring femoral thrombectomy and fasciotomy (6/1), VT/VF then PEA arrest 6/3. She required initiation of RRT 09/06/13 and has remained dialysis dependent.  Short trial of IHD unsuccessful and back to CRRT as of 6/4 with  inotrope requirement and inability to wean. Pt transitioned to intermittent HD as of 6/29.Marland Kitchen    PT Comments    Pt progressing towards physical therapy goals. Was able to improve ambulation distance with no reports of increased pain or myoclonic jerks noted. Reviewed HEP and discussed recommended frequency until HHPT follows up at d/c. Will continue to follow and progress as able per POC.   Follow Up Recommendations  Home health PT;Supervision/Assistance - 24 hour     Equipment Recommendations  Rolling walker with 5" wheels;Wheelchair (measurements PT)    Recommendations for Other Services       Precautions / Restrictions Precautions Precautions: Fall Precaution Comments: Has had myoclonic jerks with buckling resulting in fall. Currently myoclonic jerks much improved. Restrictions Weight Bearing Restrictions: No    Mobility  Bed Mobility               General bed mobility comments: Pt sitting up in recliner upon arrival  Transfers Overall transfer level: Needs assistance Equipment used: Rolling walker (2 wheeled) Transfers: Sit to/from Stand Sit to Stand: Min guard         General transfer comment: VC's for hand placement on seated surface for safety.   Ambulation/Gait Ambulation/Gait assistance: Min guard Ambulation Distance (Feet): 150 Feet (75', rest break, 75') Assistive device: Rolling walker (2  wheeled) Gait Pattern/deviations: Step-through pattern;Decreased stride length;Decreased stance time - right;Decreased step length - left Gait velocity: Decreased Gait velocity interpretation: Below normal speed for age/gender General Gait Details: Pt with decr stance time on rt due to tightness in rt calf. No myoclonic jerks noted this session. Pt states she has not noticed any today.    Stairs            Wheelchair Mobility    Modified Rankin (Stroke Patients Only)       Balance Overall balance assessment: Needs assistance Sitting-balance support: Feet supported;No upper extremity supported Sitting balance-Leahy Scale: Good     Standing balance support: Bilateral upper extremity supported;During functional activity Standing balance-Leahy Scale: Fair Standing balance comment: No assist required to static stand. Min guard during dynamic movement.                     Cognition Arousal/Alertness: Awake/alert (Lethargic initially due to meds) Behavior During Therapy: WFL for tasks assessed/performed Overall Cognitive Status: Within Functional Limits for tasks assessed                      Exercises General Exercises - Lower Extremity Quad Sets: 10 reps Long Arc Quad: 10 reps Hip ABduction/ADduction: 10 reps    General Comments General comments (skin integrity, edema, etc.): Discussed HEP and frequency      Pertinent Vitals/Pain Vitals stable throughout session.     Home Living  Prior Function            PT Goals (current goals can now be found in the care plan section) Acute Rehab PT Goals Patient Stated Goal: To be safe at home PT Goal Formulation: With patient Time For Goal Achievement: 10/23/13 Potential to Achieve Goals: Good Progress towards PT goals: Progressing toward goals    Frequency  Min 3X/week    PT Plan Current plan remains appropriate    Co-evaluation             End of Session  Equipment Utilized During Treatment: Gait belt Activity Tolerance: Patient tolerated treatment well Patient left: in chair;with call bell/phone within reach     Time: 1444-1515 PT Time Calculation (min): 31 min  Charges:  $Gait Training: 8-22 mins $Therapeutic Exercise: 8-22 mins                    G Codes:      Jolyn Lent 11/02/2013, 3:34 PM  Jolyn Lent, PT, DPT Acute Rehabilitation Services Pager: [phone]

## 2013-10-25 NOTE — Discharge Instructions (Signed)

## 2013-10-25 NOTE — Progress Notes (Signed)
[place] for Coumadin Indication: Thromboembolic state (RUE DVT)  Allergies  Allergen Reactions  . Crestor [Rosuvastatin] Other (See Comments)    Severe muscle weakness  . Nsaids Other (See Comments)    Not allergic, "bad on my kidneys"  . Ciprofloxacin Rash    Patient Measurements: Height: 5' 7.5" (171.5 cm) Weight: 271 lb 9.7 oz (123.2 kg) IBW/kg (Calculated) : 62.75   Vital Signs: Temp: 98.1 F (36.7 C) (07/09 0300) Temp src: Oral (07/09 0300) BP: 125/70 mmHg (07/09 0400) Pulse Rate: 92 (07/09 0700)  Labs:  Recent Labs  10/23/13 0334 10/24/13 0434 10/25/13 0420  HGB 8.1* 8.0* 8.5*  HCT 26.9* 26.5* 28.1*  PLT 346 356 389  LABPROT 25.0* 24.0* 22.0*  INR 2.26* 2.15* 1.92*  CREATININE 3.89* 5.35* 4.10*    Estimated Creatinine Clearance: 23.8 ml/min (by C-G formula based on Cr of 4.1).  Assessment: 43yof continues on coumadin for RUE DVT. She has completed 5 days of overlap therapy with argatroban. Argatroban was stopped 6/25 due to therapeutic INR.   INR today just below goal at 1.9. Hgb/Hct low but now improving, plts normal. No overt s/sx of bleeding noted. Increasing warfarin requirements now that more stable.  Goal of Therapy: INR 2-3 Monitor platelets by anticoagulation protocol: Yes   Plan:  1. Warfarin 7.5mg  tonight  2. Follow-up AM INR 3. Coumadin education today now that out of ICU  Erin Hearing PharmD., BCPS Clinical Pharmacist Pager [phone] 10/25/2013 9:08 AM

## 2013-10-25 NOTE — Progress Notes (Signed)
Fentanyl 140mcg given IV for fasciotomy pain.

## 2013-10-25 NOTE — Progress Notes (Signed)
Patient ID: Hannah [name], female   DOB: 1970-08-30, 43 y.o.   MRN: [id number]  [facility] Progress Note   Assessment/ Plan:   1. ESRD- (dialysis dependent since 5/21)on a Monday/Wednesday/Friday schedule. The process has been started to find her an outpatient dialysis unit (likely [facility]). Currently dialyzing via tunneled dialysis catheter (permanent access deferred until cardiac status more stable). 2. 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 Weight slightly decreased with UF. 3. Anemia of chronic disease- on aranesp 271mcg weekly 4. Right axillary DVT: on coumadin per pharmacy 5. Vascular access- HD via [facility], AVF/AVG when current cardiac instability improves (patient informs me that Dr.Brabham intends to manage carotid stenosis prior to HD access creation) 6. DM- on insulin 7. S/p right femoral endarterectomy/fasciotomy with wound dehiscence and now on vac (episode of vascular insufficiency with cold foot after IABP)  Subjective:   No acute events overnight and reports to be comfortable   Objective:   BP 125/70  Pulse 92  Temp(Src) 98.1 F (36.7 C) (Oral)  Resp 11  Ht 5' 7.5" (1.715 m)  Wt 123.2 kg (271 lb 9.7 oz)  BMI 41.89 kg/m2  SpO2 100%  LMP 08/19/2013  Physical Exam: MEQ:ASTMHDQQIWL sleeping in bed NLG:XQJJH RRR, normal S1 and S2 Resp:Decreased BS over bases, no rales/rhonchi ERD:EYCX, obese, NT Ext:1+ LE edema. Wound vac Right leg and groin  Labs: BMET  Recent Labs Lab 10/19/13 0355 10/20/13 0440 10/21/13 0425 10/22/13 0400 10/23/13 0334 10/24/13 0434 10/25/13 0420  NA 132* 134* 130* 134* 136* 134* 133*  K 4.8 4.3 5.0 4.4 4.3 4.9 4.4  CL 92* 96 92* 95* 96 94* 93*  CO2 25 25 24 28 28 26 26   GLUCOSE 161* 125* 137* 144* 154* 138* 106*  BUN 49* 33* 42* 31* 24* 42* 32*  CREATININE 5.85* 4.47* 5.85* 4.67* 3.89* 5.35* 4.10*  CALCIUM 8.1* 7.9* 8.3* 8.1* 8.3* 8.7 8.5   PHOS 6.2* 4.4 5.4* 4.4 3.7 4.7* 3.9   CBC  Recent Labs Lab 10/22/13 0400 10/23/13 0334 10/24/13 0434 10/25/13 0420  WBC 5.3 5.7 6.9 6.6  HGB 7.6* 8.1* 8.0* 8.5*  HCT 24.5* 26.9* 26.5* 28.1*  MCV 91.4 92.4 92.0 91.5  PLT 311 346 356 389   Medications:    . alteplase  2 mg Intracatheter Once  . aspirin  81 mg Oral Daily  . atorvastatin  80 mg Oral q1800  . calcitRIOL  0.25 mcg Oral Daily  . collagenase   Topical Daily  . darbepoetin (ARANESP) injection - NON-DIALYSIS  200 mcg Subcutaneous Q Wed-1800  . feeding supplement (NEPRO CARB STEADY)  237 mL Oral TID WC  . fludrocortisone  0.1 mg Oral Daily  . insulin aspart  0-15 Units Subcutaneous TID WC  . insulin aspart  0-5 Units Subcutaneous QHS  . insulin aspart  4 Units Subcutaneous TID WC  . insulin glargine  20 Units Subcutaneous QHS  . levETIRAcetam  1,000 mg Oral BID  . lidocaine (PF)  5 mL Other Once  . midodrine  10 mg Oral TID WC  . pantoprazole  40 mg Oral Daily  . polyethylene glycol  17 g Oral Daily  . sodium chloride  10-40 mL Intracatheter Q12H  . Warfarin - Pharmacist Dosing Inpatient   Does not apply [id number]   Elmarie Shiley, MD 10/25/2013, 7:50 AM

## 2013-10-25 NOTE — Progress Notes (Signed)
UR Completed.  Hannah [name] [phone] 10/25/2013

## 2013-10-25 NOTE — Consult Note (Addendum)
WOC wound follow-up consult note Reason for Consult: Requested to assist bedside nurse with vac dressing changes.  VVS team is following for assessment and plan of care to right leg wounds in 2 locations and right groin wound.  Inner calf fasciotomy 10% yellow, 90% red.  8.5X2X1cm, mod amt yellow drainage, no odor.  Applied one piece black foam to 44mm cont suction. Outer calf fasciotomy 10% yellow, 90% red. Exposed tendon visible,  6X1.2X.3cm, mod amt yellow drainage, no odor.  Applied one piece black foam to 85mm cont suction with Y connector. No odor, minimal amt pink drainage in cannister. Pt tolerated dressing changes with minimal discomfort.  Pain meds given prior to procedure.  Mepitel contact layer applied to leg wounds to decrease discomfort with dressing changes.  Plan for bedside nurses to change dressings Q Tues/Thurs/Sat. Recommend VVS team assess wounds prior to discharge; pt may not need to go home with Vac by the time she leaves the hospital since wounds have continued to improve.  Please re-consult if further assistance is needed.  Thank-you,  Julien Girt MSN, [place], [place], [place], [place]

## 2013-10-26 LAB — CBC
HCT: 26.7 % — ABNORMAL LOW (ref 36.0–46.0)
Hemoglobin: 8.1 g/dL — ABNORMAL LOW (ref 12.0–15.0)
MCH: 27.6 pg (ref 26.0–34.0)
MCHC: 30.3 g/dL (ref 30.0–36.0)
MCV: 90.8 fL (ref 78.0–100.0)
PLATELETS: 363 10*3/uL (ref 150–400)
RBC: 2.94 MIL/uL — ABNORMAL LOW (ref 3.87–5.11)
RDW: 17.3 % — ABNORMAL HIGH (ref 11.5–15.5)
WBC: 7 10*3/uL (ref 4.0–10.5)

## 2013-10-26 LAB — MAGNESIUM: Magnesium: 2.3 mg/dL (ref 1.5–2.5)

## 2013-10-26 LAB — RENAL FUNCTION PANEL
ALBUMIN: 2.4 g/dL — AB (ref 3.5–5.2)
Anion gap: 15 (ref 5–15)
BUN: 51 mg/dL — ABNORMAL HIGH (ref 6–23)
CO2: 25 meq/L (ref 19–32)
CREATININE: 6.02 mg/dL — AB (ref 0.50–1.10)
Calcium: 8.5 mg/dL (ref 8.4–10.5)
Chloride: 93 mEq/L — ABNORMAL LOW (ref 96–112)
GFR, EST AFRICAN AMERICAN: 9 mL/min — AB (ref >90)
GFR, EST NON AFRICAN AMERICAN: 8 mL/min — AB (ref >90)
Glucose, Bld: 179 mg/dL — ABNORMAL HIGH (ref 70–99)
PHOSPHORUS: 5.5 mg/dL — AB (ref 2.3–4.6)
Potassium: 4.6 mEq/L (ref 3.7–5.3)
SODIUM: 133 meq/L — AB (ref 137–147)

## 2013-10-26 LAB — GLUCOSE, CAPILLARY
GLUCOSE-CAPILLARY: 165 mg/dL — AB (ref 70–99)
GLUCOSE-CAPILLARY: 234 mg/dL — AB (ref 70–99)
Glucose-Capillary: 245 mg/dL — ABNORMAL HIGH (ref 70–99)
Glucose-Capillary: 98 mg/dL (ref 70–99)

## 2013-10-26 LAB — PROTIME-INR
INR: 2.39 — ABNORMAL HIGH (ref 0.00–1.49)
PROTHROMBIN TIME: 26.1 s — AB (ref 11.6–15.2)

## 2013-10-26 LAB — CARBOXYHEMOGLOBIN
CARBOXYHEMOGLOBIN: 1.8 % — AB (ref 0.5–1.5)
Methemoglobin: 0.7 % (ref 0.0–1.5)
O2 SAT: 54.4 %
Total hemoglobin: 8.8 g/dL — ABNORMAL LOW (ref 12.0–16.0)

## 2013-10-26 MED ORDER — HEPARIN SODIUM (PORCINE) 1000 UNIT/ML DIALYSIS: 5000.0000 [IU] | INTRAMUSCULAR | Status: DC | PRN

## 2013-10-26 MED ORDER — FENTANYL CITRATE 0.05 MG/ML IJ SOLN
INTRAMUSCULAR | Status: AC
  Filled 2013-10-26: qty 2

## 2013-10-26 MED ORDER — WARFARIN SODIUM 2.5 MG PO TABS
2.5000 mg | ORAL_TABLET | Freq: Once | ORAL | Status: AC
  Administered 2013-10-26: 2.5 mg via ORAL
  Filled 2013-10-26: qty 1

## 2013-10-26 NOTE — Progress Notes (Signed)
Results for GEORGIANNA, BAND (MRN [id number]) as of 10/26/2013 12:28  Ref. Range 10/25/2013 17:02 10/25/2013 21:29 10/25/2013 22:23 10/26/2013 07:54 10/26/2013 11:31  Glucose-Capillary Latest Range: 70-99 mg/dL 214 (H) 257 (H) 276 (H) 165 (H) 245 (H)  Postprandial CBGs continue to be greater than 180 mg/dl.  Recommend increasing Novolog meal coverage to 6 units TID.  Harvel Ricks RN BSN CDE

## 2013-10-26 NOTE — Progress Notes (Addendum)
Patient ID: Hannah [name], female   DOB: [birth year], 42 y.o.   MRN: [id number]  ADVANCED HEART FAILURE ROUNDING NOTE   SUBJECTIVE  43 yo female with PMH of DM1, stage V kidney disease, blindness, h/o lung CA s/p resection who was recently discharged for heart failure symptom had a high risk stress test which showed ischemia in anterior and apical region. She underwent cath 09/04/2013 which showed chronically occluded RCA and triple vessel dx. CT surgery consulted but not felt to be surgical candidate due to poor PFTs, suboptimal targets, comorbidities and shock. EF 22% by Myoview. 30-35% by echo  Underwent placement of swan and IABP for shock (co-ox 38%). Unfortunately developed cold foot and IABP had to be removed.Underwent R femoral thrombectomy and 4 compartment fasciotomy of RLE on 6/1.On 6/3 had VT/VF->PEA arrest 10 mins CPR. U/s shows R axillary DVT.   Now on intermittent HD. Dobutamine stopped several days ago.  CO-OX now up 49-> 54% today. Feels good. Weight stable.  Denies CP or SOB or dizziness. BP stable.     CURRENT MEDS . alteplase  2 mg Intracatheter Once  . aspirin  81 mg Oral Daily  . atorvastatin  80 mg Oral q1800  . calcitRIOL  0.25 mcg Oral Daily  . collagenase   Topical Daily  . darbepoetin (ARANESP) injection - NON-DIALYSIS  200 mcg Subcutaneous Q Wed-1800  . feeding supplement (NEPRO CARB STEADY)  237 mL Oral TID WC  . insulin aspart  0-15 Units Subcutaneous TID WC  . insulin aspart  0-5 Units Subcutaneous QHS  . insulin aspart  4 Units Subcutaneous TID WC  . insulin glargine  20 Units Subcutaneous QHS  . levETIRAcetam  1,000 mg Oral BID  . lidocaine (PF)  5 mL Other Once  . midodrine  10 mg Oral TID WC  . pantoprazole  40 mg Oral Daily  . polyethylene glycol  17 g Oral Daily  . sodium chloride  10-40 mL Intracatheter Q12H  . warfarin  2.5 mg Oral ONCE-1800  . Warfarin - Pharmacist Dosing Inpatient   Does not apply q1800    OBJECTIVE  Filed Vitals:    10/26/13 1130 10/26/13 1320 10/26/13 1325 10/26/13 1400  BP: 114/72 165/100 115/68 123/74  Pulse: 94   89  Temp: 98.2 F (36.8 C) 97.7 F (36.5 C)    TempSrc: Oral Oral    Resp: 14     Height:      Weight:  124.5 kg (274 lb 7.6 oz)    SpO2: 93% 99%  99%    Intake/Output Summary (Last 24 hours) at 10/26/13 1422 Last data filed at 10/26/13 0745  Gross per 24 hour  Intake    480 ml  Output      0 ml  Net    480 ml   Filed Weights   10/25/13 0300 10/26/13 0323 10/26/13 1320  Weight: 123.2 kg (271 lb 9.7 oz) 124.4 kg (274 lb 4 oz) 124.5 kg (274 lb 7.6 oz)    PHYSICAL EXAM  General: Sitting in bedNAD Neuro: Alert and oriented X 3. HEENT:  Normal  Neck: Supple without bruits. Chest L subclav PC. R subclav TLC. JVP up Lungs:  clear Heart: tachy regular.  Abdomen: Soft, non-tender, non-distended, BS + x 4.  Extremities: 1-2+ edema bilateral ankles. R groin and RLE wounds  CBC  Recent Labs  10/25/13 0420 10/26/13 0650  WBC 6.6 7.0  HGB 8.5* 8.1*  HCT 28.1* 26.7*  MCV 91.5 90.8  PLT 389 641   Basic Metabolic Panel  Recent Labs  10/25/13 0420 10/26/13 0650  NA 133* 133*  K 4.4 4.6  CL 93* 93*  CO2 26 25  GLUCOSE 106* 179*  BUN 32* 51*  CREATININE 4.10* 6.02*  CALCIUM 8.5 8.5  MG 2.1 2.3  PHOS 3.9 5.5*   Liver Function Tests  Recent Labs  10/25/13 0420 10/26/13 0650  ALBUMIN 2.3* 2.4*   Cardiac Enzymes No results found for this basename: CKTOTAL, CKMB, CKMBINDEX, TROPONINI,  in the last 72 hours Thyroid Function Tests No results found for this basename: TSH, T4TOTAL, FREET3, T3FREE, THYROIDAB,  in the last 72 hours  TELE  Sinus tachycardia 100-110   Radiology/Studies  Dg Chest 2 View  09/06/2013   CLINICAL DATA:  CHF, intermittent dyspnea  EXAM: CHEST  2 VIEW  COMPARISON:  Prior chest x-ray 08/13/2013; prior chest CT 08/22/2013  FINDINGS: Stable cardiac and mediastinal contours. Atherosclerotic calcifications are present within the transverse  aorta. Moderate left and small right pleural effusions are similar compared to prior. There is persistent associated bibasilar atelectasis. Mild pulmonary vascular congestion without overt edema. No pneumothorax. No new focal airspace consolidation. No acute osseous abnormality.  IMPRESSION: 1. Stable moderate left and small right pleural effusions and associated bibasilar atelectasis. 2. Pulmonary vascular congestion without overt edema.   Electronically Signed   By: Jacqulynn Cadet M.D.   On: 09/06/2013 07:55   Ct Chest Wo Contrast  08/22/2013   CLINICAL DATA:  Left lung cancer status post lobectomy. Weight gain. Renal insufficiency.  EXAM: CT CHEST WITHOUT CONTRAST  TECHNIQUE: Multidetector CT imaging of the chest was performed following the standard protocol without IV contrast.  COMPARISON:  Radiographs 08/13/2013 and 08/04/2013.  CT 08/02/2012.  FINDINGS: There are stable postsurgical changes related to prior left lower lobe resection. There has been interval enlargement of several mediastinal lymph nodes. These include 12 mm right paratracheal (image 19), 13 mm precarinal (image 23), and 14 mm AP window (image 25) lymph nodes. Some of these nodes have retained fatty hila. Allowing for the limitations of noncontrast technique, the hila appear stable.  There is stable low-density within the right thyroid lobe. Atherosclerosis of the aorta, great vessels and coronary arteries is noted. The heart size is normal. There is no pericardial effusion.  Moderate size dependent pleural effusions are present bilaterally. There is associated dependent airspace disease in both lower lobes. In addition, there are more focal airspace opacities within the right lower lobe and inferior aspect of the left upper lobe. The latter is somewhat nodular, measuring up to 1.8 cm on image 43. No other focal nodularity or endobronchial lesions are demonstrated.  The visualized upper abdomen has a stable appearance. There is no adrenal  mass. There is increased subcutaneous edema throughout the subcutaneous fat, especially within the anterior aspect of the upper abdomen.  No worrisome osseous findings are demonstrated.  IMPRESSION: 1. As demonstrated radiographically, there are bilateral pleural effusions and bilateral airspace opacities which are new compared with the prior CT. Although there are focal somewhat nodular components in the right lower and left upper lobes, these findings are most likely infectious/inflammatory. 2. Progressive mediastinal lymphadenopathy. Some of the nodes have retained fatty hila and may be reactive. Metastatic disease cannot be completely excluded. 3. Stable atherosclerosis and low-density within the right thyroid lobe. 4. If the airspace opacities and pleural effusions fail to respond to appropriate clinical therapy, follow-up CT or PET-CT may be warranted to exclude metastatic disease.  Electronically Signed   By: Camie Patience M.D.   On: 08/22/2013 10:26   Dg Chest Portable 1 View  08/13/2013   CLINICAL DATA:  Shortness of breath increasing for 2 weeks. History of CHF.  EXAM: PORTABLE CHEST - 1 VIEW  COMPARISON:  08/04/2013  FINDINGS: Again noted are bibasilar lung densities that are suggestive for pleural effusions and atelectasis. Heart size appears to be enlarged. There is some peribronchial thickening and cannot exclude pulmonary edema.  IMPRESSION: Bilateral pleural effusions with basilar atelectasis. There is mild pulmonary edema. Minimal change from the previous examination.   Electronically Signed   By: Markus Daft M.D.   On: 08/13/2013 23:10    ASSESSMENT AND PLAN 1. Cardiogenic shock 2. Acute on chronic systolic HF     EF ~13-08% 3. 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 Acute respiratory failure 5. 3-V CAD, severe.  6. Carotid stenosis     - doppler 08/31/2013 shows R 60-79% ICA stenosis, L 80-99% ICA stenosis     - vascular surgery aware 7. Lung CA  h/o LLL lobectomy (2 yr ago due to non-small cell  carcinoma)      - PFT 09/05/2013 pre-FEV1 28%, pre-FVC 34%, pre-FEV1/FVC ratio 68 8. DM1 9. Ischemic R foot s/p R femoral thrombectomy and 4-compartment fasciotomy.  10. VT/VF arrest 6/3 11. R axillary DVT 12. Severe protein calorie malnutrition  13. Large right groin wound s/p debridement 6/12 14. Anemia: Transfuse hemoglobin < 7.  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 Co-ox and BP improved. Tolerating iHD.   I think we can begin to work toward discharge potentially as early as Monday. Important issues will be:  1) Is outpatient HD set up?  - I spoke with Dr. Posey Pronto. She will get inpatient HD on Monday and can then be discharged with outpatient HD next Wednesday 2) Can we arrange sufficient outpatient wound care? (VVS addressing)  Will continue midodrine.   Continue keppra for myoclonus  Warfarin dosing d/w Pharmacy.   Continue to manage HF and CAD medically. Therapy limited by hypotension but may be able to consider getting low-dose therapy on board.   Continue PT.   Daniel Bensimhon,MD 2:22 PM

## 2013-10-26 NOTE — Progress Notes (Signed)
Patient ID: Hannah [name], female   DOB: 1971/02/25, 43 y.o.   MRN: [id number]  [facility] Progress Note   Assessment/ Plan:   1. ESRD- (dialysis dependent since 5/21)on a Monday/Wednesday/Friday schedule. The process has been started to find her an outpatient dialysis unit (likely [facility]). Currently dialyzing via tunneled dialysis catheter (permanent access deferred until cardiac status more stable). HD today 2. 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 Weight slightly decreased with UF. 3. Anemia of chronic disease- on aranesp 278mcg weekly 4. Right axillary DVT: on coumadin per pharmacy 5. Vascular access- HD via [facility], AVF/AVG when current cardiac instability improves (patient informs me that Dr.Brabham intends to manage carotid stenosis prior to HD access creation) 6. DM- on insulin 7. S/p right femoral endarterectomy/fasciotomy with wound dehiscence and now on vac (episode of vascular insufficiency with cold foot after IABP)  Subjective:   Reports to be feeling well and excited about possible DC on Monday of next week   Objective:   BP 121/74  Pulse 93  Temp(Src) 97.6 F (36.4 C) (Oral)  Resp 20  Ht 5' 7.5" (1.715 m)  Wt 124.4 kg (274 lb 4 oz)  BMI 42.30 kg/m2  SpO2 98%  LMP 08/19/2013  Physical Exam: DPO:EUMPNTIRWER sitting up in bed XVQ:MGQQP RRR, normal S1 and S2 Resp:CTA bilaterally, no rales/rhonchi YPP:JKDT, obese, NT, BS normal OIZ:TIWPY-0+ LE edema. Right leg/groin wound Vac  Labs: BMET  Recent Labs Lab 10/20/13 0440 10/21/13 0425 10/22/13 0400 10/23/13 0334 10/24/13 0434 10/25/13 0420 10/26/13 0650  NA 134* 130* 134* 136* 134* 133* 133*  K 4.3 5.0 4.4 4.3 4.9 4.4 4.6  CL 96 92* 95* 96 94* 93* 93*  CO2 25 24 28 28 26 26 25   GLUCOSE 125* 137* 144* 154* 138* 106* 179*  BUN 33* 42* 31* 24* 42* 32* 51*  CREATININE 4.47* 5.85* 4.67* 3.89* 5.35* 4.10* 6.02*  CALCIUM  7.9* 8.3* 8.1* 8.3* 8.7 8.5 8.5  PHOS 4.4 5.4* 4.4 3.7 4.7* 3.9 5.5*   CBC  Recent Labs Lab 10/23/13 0334 10/24/13 0434 10/25/13 0420 10/26/13 0650  WBC 5.7 6.9 6.6 7.0  HGB 8.1* 8.0* 8.5* 8.1*  HCT 26.9* 26.5* 28.1* 26.7*  MCV 92.4 92.0 91.5 90.8  PLT 346 356 389 363   Medications:    . alteplase  2 mg Intracatheter Once  . aspirin  81 mg Oral Daily  . atorvastatin  80 mg Oral q1800  . calcitRIOL  0.25 mcg Oral Daily  . collagenase   Topical Daily  . darbepoetin (ARANESP) injection - NON-DIALYSIS  200 mcg Subcutaneous Q Wed-1800  . feeding supplement (NEPRO CARB STEADY)  237 mL Oral TID WC  . insulin aspart  0-15 Units Subcutaneous TID WC  . insulin aspart  0-5 Units Subcutaneous QHS  . insulin aspart  4 Units Subcutaneous TID WC  . insulin glargine  20 Units Subcutaneous QHS  . levETIRAcetam  1,000 mg Oral BID  . lidocaine (PF)  5 mL Other Once  . midodrine  10 mg Oral TID WC  . pantoprazole  40 mg Oral Daily  . polyethylene glycol  17 g Oral Daily  . sodium chloride  10-40 mL Intracatheter Q12H  . Warfarin - Pharmacist Dosing Inpatient   Does not apply [id number]   Elmarie Shiley, MD 10/26/2013, 8:42 AM

## 2013-10-26 NOTE — Progress Notes (Signed)
PT Cancellation Note  Patient Details Name: Hannah [name] MRN: [id number] DOB: [birth year]   Cancelled Treatment:    Reason Eval/Treat Not Completed: Patient at procedure or test/unavailable. Pt currently off unit for HD. Will check back as schedule allows.    Jolyn Lent 10/26/2013, 1:56 PM  Jolyn Lent, PT, DPT Acute Rehabilitation Services Pager: [phone]

## 2013-10-26 NOTE — Progress Notes (Signed)
[place] for Coumadin Indication: Thromboembolic state (RUE DVT)  Allergies  Allergen Reactions  . Crestor [Rosuvastatin] Other (See Comments)    Severe muscle weakness  . Nsaids Other (See Comments)    Not allergic, "bad on my kidneys"  . Ciprofloxacin Rash    Patient Measurements: Height: 5' 7.5" (171.5 cm) Weight: 274 lb 4 oz (124.4 kg) IBW/kg (Calculated) : 62.75   Vital Signs: Temp: 97.6 F (36.4 C) (07/10 0745) Temp src: Oral (07/10 0745) BP: 121/74 mmHg (07/10 0745) Pulse Rate: 93 (07/10 0745)  Labs:  Recent Labs  10/24/13 0434 10/25/13 0420 10/26/13 0650  HGB 8.0* 8.5* 8.1*  HCT 26.5* 28.1* 26.7*  PLT 356 389 363  LABPROT 24.0* 22.0* 26.1*  INR 2.15* 1.92* 2.39*  CREATININE 5.35* 4.10* 6.02*    Estimated Creatinine Clearance: 16.3 ml/min (by C-G formula based on Cr of 6.02).  Assessment: 43yof admitted 09/03/2013  admit w/ cardiogenic shock, EF 22%, underwent swan/IABP for shock but IABP had to be removed due to cold foot, underwent R femoral thrombectomy and fasciotomy of RLE on 6/1, on 6/3 had PEA arrest x 10 mins. Pharmacy consulted to continue coumadin for RUE DVT.   PMH: DM, PN, CM, CHF EF 22%, CAD, GERD, HTN, lung CA s/p lobectomy  Coag: RUE DVT, HIT: She has completed 5 days of overlap therapy with argatroban. Argatroban was stopped 6/25 due to therapeutic INR.   INR today at goal has been labile, on average 2.5 mg daily over the last 7d. Hgb/Hct low but now improving, plts normal. No overt s/sx of bleeding noted.   CV:CAD, PVD, s/p arrest ASA, Atorva, midodrine 10 tid  ID: Completed 15 days of ABX for groin site infection, Afeb, WBC wnl, LA 1; Cdiff neg  Endo: SSI, Lantus  Renal: HD MWF Calcitriol, aranesp,   Neuro: Keppra  Goal of Therapy: INR 2-3 Monitor platelets by anticoagulation protocol: Yes   Plan:  1. Warfarin 2.5mg  tonight  2. Follow-up AM INR 3. Coumadin education today now that out  of ICU  Thank you for allowing pharmacy to be a part of this patients care team.  Rowe Robert Pharm.D., BCPS, AQ-Cardiology Clinical Pharmacist 10/26/2013 8:43 AM Pager: [phone] Phone: [phone]

## 2013-10-26 NOTE — Procedures (Signed)
Patient seen on Hemodialysis. QB 400, UF goal 5L Treatment adjusted as needed.  Elmarie Shiley MD [facility]. Office # [phone] Pager # [phone] 1:53 PM

## 2013-10-26 NOTE — Progress Notes (Signed)
Pt will begin outpatient dialysis at the [facility] dialysis center in [place] on Wednesday, October 31, 2013.  Pt will be scheduled for MWF on second shift.  Pt is aware of schedule.

## 2013-10-27 LAB — CBC
HEMATOCRIT: 28.3 % — AB (ref 36.0–46.0)
Hemoglobin: 8.6 g/dL — ABNORMAL LOW (ref 12.0–15.0)
MCH: 27.5 pg (ref 26.0–34.0)
MCHC: 30.4 g/dL (ref 30.0–36.0)
MCV: 90.4 fL (ref 78.0–100.0)
PLATELETS: 356 10*3/uL (ref 150–400)
RBC: 3.13 MIL/uL — ABNORMAL LOW (ref 3.87–5.11)
RDW: 17.4 % — AB (ref 11.5–15.5)
WBC: 6.6 10*3/uL (ref 4.0–10.5)

## 2013-10-27 LAB — RENAL FUNCTION PANEL
ANION GAP: 15 (ref 5–15)
Albumin: 2.3 g/dL — ABNORMAL LOW (ref 3.5–5.2)
BUN: 37 mg/dL — AB (ref 6–23)
CO2: 26 meq/L (ref 19–32)
CREATININE: 4.87 mg/dL — AB (ref 0.50–1.10)
Calcium: 8.3 mg/dL — ABNORMAL LOW (ref 8.4–10.5)
Chloride: 96 mEq/L (ref 96–112)
GFR calc Af Amer: 11 mL/min — ABNORMAL LOW (ref >90)
GFR calc non Af Amer: 10 mL/min — ABNORMAL LOW (ref >90)
Glucose, Bld: 214 mg/dL — ABNORMAL HIGH (ref 70–99)
POTASSIUM: 4.6 meq/L (ref 3.7–5.3)
Phosphorus: 4.5 mg/dL (ref 2.3–4.6)
Sodium: 137 mEq/L (ref 137–147)

## 2013-10-27 LAB — CARBOXYHEMOGLOBIN
Carboxyhemoglobin: 1.8 % — ABNORMAL HIGH (ref 0.5–1.5)
Methemoglobin: 0.9 % (ref 0.0–1.5)
O2 Saturation: 52 %
Total hemoglobin: 8.8 g/dL — ABNORMAL LOW (ref 12.0–16.0)

## 2013-10-27 LAB — GLUCOSE, CAPILLARY
GLUCOSE-CAPILLARY: 244 mg/dL — AB (ref 70–99)
GLUCOSE-CAPILLARY: 281 mg/dL — AB (ref 70–99)
Glucose-Capillary: 185 mg/dL — ABNORMAL HIGH (ref 70–99)
Glucose-Capillary: 195 mg/dL — ABNORMAL HIGH (ref 70–99)

## 2013-10-27 LAB — PROTIME-INR
INR: 3.02 — ABNORMAL HIGH (ref 0.00–1.49)
PROTHROMBIN TIME: 31.3 s — AB (ref 11.6–15.2)

## 2013-10-27 LAB — MAGNESIUM: Magnesium: 2.1 mg/dL (ref 1.5–2.5)

## 2013-10-27 MED ORDER — LEVETIRACETAM 500 MG PO TABS
500.0000 mg | ORAL_TABLET | Freq: Two times a day (BID) | ORAL | Status: DC
  Administered 2013-10-27: 500 mg via ORAL
  Filled 2013-10-27 (×3): qty 1

## 2013-10-27 MED ORDER — WARFARIN SODIUM 2.5 MG PO TABS
2.5000 mg | ORAL_TABLET | Freq: Once | ORAL | Status: AC
  Administered 2013-10-27: 2.5 mg via ORAL
  Filled 2013-10-27: qty 1

## 2013-10-27 NOTE — Progress Notes (Addendum)
  [facility] Progress Note  10/27/2013 9:04 AM 29 Days Post-Op  Subjective:  No complaints - sitting on side of bed  Afebrile VSS 98% RA  Gtts:  none  Filed Vitals:   10/27/13 0723  BP: 100/53  Pulse: 93  Temp: 97.8 F (36.6 C)  Resp: 13    Physical Exam: Incisions:  Right groin incision with good granulation tissue to groin.  Still with some fat necrosis on the abdomen.  Fasciotomy sites with good granulation tissue.   CBC    Component Value Date/Time   WBC 6.6 10/27/2013 0630   RBC 3.13* 10/27/2013 0630   HGB 8.6* 10/27/2013 0630   HCT 28.3* 10/27/2013 0630   PLT 356 10/27/2013 0630   MCV 90.4 10/27/2013 0630   MCH 27.5 10/27/2013 0630   MCHC 30.4 10/27/2013 0630   RDW 17.4* 10/27/2013 0630   LYMPHSABS 1.4 08/04/2013 1223   MONOABS 0.4 08/04/2013 1223   EOSABS 0.2 08/04/2013 1223   BASOSABS 0.1 08/04/2013 1223    BMET    Component Value Date/Time   NA 137 10/27/2013 0630   K 4.6 10/27/2013 0630   CL 96 10/27/2013 0630   CO2 26 10/27/2013 0630   GLUCOSE 214* 10/27/2013 0630   BUN 37* 10/27/2013 0630   CREATININE 4.87* 10/27/2013 0630   CREATININE 4.17* 07/06/2013 1618   CALCIUM 8.3* 10/27/2013 0630   GFRNONAA 10* 10/27/2013 0630   GFRNONAA 71 07/13/2011 0835   GFRAA 11* 10/27/2013 0630   GFRAA 82 07/13/2011 0835    INR    Component Value Date/Time   INR 3.02* 10/27/2013 0630     Intake/Output Summary (Last 24 hours) at 10/27/13 0904 Last data filed at 10/26/13 1800  Gross per 24 hour  Intake    720 ml  Output   4500 ml  Net  -3780 ml     Assessment:  43 y.o. female is s/p:  Right femoral endarterectomy, right femoral thrombectomy, four compartment fasciotomy  40 Days Post-Op  and  Closure right leg fasciotomy  38 Days Post-Op  And  I&D right groin with placement of wound vac  29 Days Post-Op     Plan: -all wounds inspected today-continue current dressing changes to groin and fasciotomy sites may have wet to dry saline dressing  changes bid now and at home. -anticipated discharge date is Monday -f/u with Dr. Trula Slade in 3 weeks   Leontine Locket, PA-C [facility] [phone] 10/27/2013 9:04 AM    Agree with above Fasciotomy wounds granulating nicely-we'll switch to moist to dry saline gauze DC home on Monday with dressing changes right inguinal area in followup in 3 weeks

## 2013-10-27 NOTE — Progress Notes (Addendum)
Patient ID: Hannah [name], female   DOB: Apr 13, 1971, 43 y.o.   MRN: [id number]  ADVANCED HEART FAILURE ROUNDING NOTE   SUBJECTIVE  43 yo female with PMH of DM1, stage V kidney disease, blindness, h/o lung CA s/p resection who was recently discharged for heart failure symptom had a high risk stress test which showed ischemia in anterior and apical region. She underwent cath 09/04/2013 which showed chronically occluded RCA and triple vessel dx. CT surgery consulted but not felt to be surgical candidate due to poor PFTs, suboptimal targets, comorbidities and shock. EF 22% by Myoview. 30-35% by echo  Underwent placement of swan and IABP for shock (co-ox 38%). Unfortunately developed cold foot and IABP had to be removed.Underwent R femoral thrombectomy and 4 compartment fasciotomy of RLE on 6/1.On 6/3 had VT/VF->PEA arrest 10 mins CPR. U/s shows R axillary DVT.   Now on intermittent HD. Dobutamine stopped several days ago.  CO-OX borderline low but stable 54%->52%. Feels ok. Weight coming down  Denies CP or SOB or dizziness. BP stable.  VVS feels wounds stable for d/c.     CURRENT MEDS . alteplase  2 mg Intracatheter Once  . aspirin  81 mg Oral Daily  . atorvastatin  80 mg Oral q1800  . calcitRIOL  0.25 mcg Oral Daily  . collagenase   Topical Daily  . darbepoetin (ARANESP) injection - NON-DIALYSIS  200 mcg Subcutaneous Q Wed-1800  . feeding supplement (NEPRO CARB STEADY)  237 mL Oral TID WC  . insulin aspart  0-15 Units Subcutaneous TID WC  . insulin aspart  0-5 Units Subcutaneous QHS  . insulin aspart  4 Units Subcutaneous TID WC  . insulin glargine  20 Units Subcutaneous QHS  . levETIRAcetam  1,000 mg Oral BID  . lidocaine (PF)  5 mL Other Once  . midodrine  10 mg Oral TID WC  . pantoprazole  40 mg Oral Daily  . polyethylene glycol  17 g Oral Daily  . sodium chloride  10-40 mL Intracatheter Q12H  . Warfarin - Pharmacist Dosing Inpatient   Does not apply q1800    OBJECTIVE  Filed  Vitals:   10/27/13 0015 10/27/13 0020 10/27/13 0405 10/27/13 0723  BP:  120/57 114/67 100/53  Pulse:    93  Temp: 98.7 F (37.1 C)  97.7 F (36.5 C) 97.8 F (36.6 C)  TempSrc: Oral  Oral Oral  Resp:  18 14 13   Height:      Weight:      SpO2: 96%  99% 98%    Intake/Output Summary (Last 24 hours) at 10/27/13 1158 Last data filed at 10/26/13 1800  Gross per 24 hour  Intake    240 ml  Output   4500 ml  Net  -4260 ml   Filed Weights   10/26/13 0323 10/26/13 1320 10/26/13 1720  Weight: 124.4 kg (274 lb 4 oz) 124.5 kg (274 lb 7.6 oz) 122.3 kg (269 lb 10 oz)    PHYSICAL EXAM  General: Sitting in bedNAD Neuro: Alert and oriented X 3. HEENT:  Normal  Neck: Supple without bruits. Chest L subclav PC. R subclav TLC. JVP up Lungs:  clear Heart: tachy regular.  Abdomen: Soft, non-tender, non-distended, BS + x 4.  Extremities: 1-2+ edema bilateral ankles. R groin and RLE wounds  CBC  Recent Labs  10/26/13 0650 10/27/13 0630  WBC 7.0 6.6  HGB 8.1* 8.6*  HCT 26.7* 28.3*  MCV 90.8 90.4  PLT 363 416   Basic Metabolic  Panel  Recent Labs  10/26/13 0650 10/27/13 0630  NA 133* 137  K 4.6 4.6  CL 93* 96  CO2 25 26  GLUCOSE 179* 214*  BUN 51* 37*  CREATININE 6.02* 4.87*  CALCIUM 8.5 8.3*  MG 2.3 2.1  PHOS 5.5* 4.5   Liver Function Tests  Recent Labs  10/26/13 0650 10/27/13 0630  ALBUMIN 2.4* 2.3*   Cardiac Enzymes No results found for this basename: CKTOTAL, CKMB, CKMBINDEX, TROPONINI,  in the last 72 hours Thyroid Function Tests No results found for this basename: TSH, T4TOTAL, FREET3, T3FREE, THYROIDAB,  in the last 72 hours  TELE  Sinus tachycardia 100-110   Radiology/Studies  Dg Chest 2 View  09/06/2013   CLINICAL DATA:  CHF, intermittent dyspnea  EXAM: CHEST  2 VIEW  COMPARISON:  Prior chest x-ray 08/13/2013; prior chest CT 08/22/2013  FINDINGS: Stable cardiac and mediastinal contours. Atherosclerotic calcifications are present within the transverse  aorta. Moderate left and small right pleural effusions are similar compared to prior. There is persistent associated bibasilar atelectasis. Mild pulmonary vascular congestion without overt edema. No pneumothorax. No new focal airspace consolidation. No acute osseous abnormality.  IMPRESSION: 1. Stable moderate left and small right pleural effusions and associated bibasilar atelectasis. 2. Pulmonary vascular congestion without overt edema.   Electronically Signed   By: Jacqulynn Cadet M.D.   On: 09/06/2013 07:55   Ct Chest Wo Contrast  08/22/2013   CLINICAL DATA:  Left lung cancer status post lobectomy. Weight gain. Renal insufficiency.  EXAM: CT CHEST WITHOUT CONTRAST  TECHNIQUE: Multidetector CT imaging of the chest was performed following the standard protocol without IV contrast.  COMPARISON:  Radiographs 08/13/2013 and 08/04/2013.  CT 08/02/2012.  FINDINGS: There are stable postsurgical changes related to prior left lower lobe resection. There has been interval enlargement of several mediastinal lymph nodes. These include 12 mm right paratracheal (image 19), 13 mm precarinal (image 23), and 14 mm AP window (image 25) lymph nodes. Some of these nodes have retained fatty hila. Allowing for the limitations of noncontrast technique, the hila appear stable.  There is stable low-density within the right thyroid lobe. Atherosclerosis of the aorta, great vessels and coronary arteries is noted. The heart size is normal. There is no pericardial effusion.  Moderate size dependent pleural effusions are present bilaterally. There is associated dependent airspace disease in both lower lobes. In addition, there are more focal airspace opacities within the right lower lobe and inferior aspect of the left upper lobe. The latter is somewhat nodular, measuring up to 1.8 cm on image 43. No other focal nodularity or endobronchial lesions are demonstrated.  The visualized upper abdomen has a stable appearance. There is no adrenal  mass. There is increased subcutaneous edema throughout the subcutaneous fat, especially within the anterior aspect of the upper abdomen.  No worrisome osseous findings are demonstrated.  IMPRESSION: 1. As demonstrated radiographically, there are bilateral pleural effusions and bilateral airspace opacities which are new compared with the prior CT. Although there are focal somewhat nodular components in the right lower and left upper lobes, these findings are most likely infectious/inflammatory. 2. Progressive mediastinal lymphadenopathy. Some of the nodes have retained fatty hila and may be reactive. Metastatic disease cannot be completely excluded. 3. Stable atherosclerosis and low-density within the right thyroid lobe. 4. If the airspace opacities and pleural effusions fail to respond to appropriate clinical therapy, follow-up CT or PET-CT may be warranted to exclude metastatic disease.   Electronically Signed   By:  Camie Patience M.D.   On: 08/22/2013 10:26   Dg Chest Portable 1 View  08/13/2013   CLINICAL DATA:  Shortness of breath increasing for 2 weeks. History of CHF.  EXAM: PORTABLE CHEST - 1 VIEW  COMPARISON:  08/04/2013  FINDINGS: Again noted are bibasilar lung densities that are suggestive for pleural effusions and atelectasis. Heart size appears to be enlarged. There is some peribronchial thickening and cannot exclude pulmonary edema.  IMPRESSION: Bilateral pleural effusions with basilar atelectasis. There is mild pulmonary edema. Minimal change from the previous examination.   Electronically Signed   By: Markus Daft M.D.   On: 08/13/2013 23:10    ASSESSMENT AND PLAN 1. Cardiogenic shock 2. Acute on chronic systolic HF     EF ~08-65% 3. 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 Acute respiratory failure 5. 3-V CAD, severe.  6. Carotid stenosis     - doppler 08/31/2013 shows R 60-79% ICA stenosis, L 80-99% ICA stenosis     - vascular surgery aware 7. Lung CA  h/o LLL lobectomy (2 yr ago due to non-small cell  carcinoma)      - PFT 09/05/2013 pre-FEV1 28%, pre-FVC 34%, pre-FEV1/FVC ratio 68 8. DM1 9. Ischemic R foot s/p R femoral thrombectomy and 4-compartment fasciotomy.  10. VT/VF arrest 6/3 11. R axillary DVT 12. Severe protein calorie malnutrition  13. Large right groin wound s/p debridement 6/12 14. Anemia: Transfuse hemoglobin < 7.  15. 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 Co-ox marginal but stable.  Will continue midodrine.   Myoclonus totally resolved after stopping dobutamine. Will wean Keppra.   Warfarin dosing d/w Pharmacy.   Continue to manage HF and CAD medically. Therapy limited by hypotension but may be able to consider getting low-dose therapy on board.   Continue PT.   Hopefully home Monday with HHRN/PT after HD.   Ryver Poblete,MD 11:58 AM

## 2013-10-27 NOTE — Progress Notes (Signed)
Patient ID: Hannah [name], female   DOB: 08/28/1970, 43 y.o.   MRN: [id number]   [facility] Progress Note   Assessment/ Plan:   1. ESRD- (dialysis dependent since 5/21)on a Monday/Wednesday/Friday schedule. Plan for hemodialysis on Monday prior to discharge-she is to restart at [facility] outpatient unit for dialysis on 7/15 2. 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 3. Anemia of chronic disease- on aranesp 260mcg weekly 4. Right axillary DVT: on coumadin per pharmacy 5. Vascular access- HD via [facility], AVF/AVG when current cardiac instability improves (patient informs me that Dr.Brabham intends to manage carotid stenosis prior to HD access creation) 6. DM- on insulin 7. S/p right femoral endarterectomy/fasciotomy with wound dehiscence and now on vac (episode of vascular insufficiency with cold foot after IABP)  Subjective:   Reports to be feeling fair, no acute events overnight. Wounds examined earlier today by vascular surgery and found to be healing well-Wound Vac removed today   Objective:   BP 100/53  Pulse 93  Temp(Src) 97.8 F (36.6 C) (Oral)  Resp 13  Ht 5' 7.5" (1.715 m)  Wt 122.3 kg (269 lb 10 oz)  BMI 41.58 kg/m2  SpO2 98%  LMP 08/19/2013  Physical Exam: Gen: Comfortably sitting up in bed CVS: Pulse regular in rate and rhythm, S1 and S2 normal Resp: Clear to auscultation bilaterally-no rales Abd: Soft, obese, nontender Ext: Trace lower extremity edema-right leg/groin wounds noted  Labs: BMET  Recent Labs Lab 10/21/13 0425 10/22/13 0400 10/23/13 0334 10/24/13 0434 10/25/13 0420 10/26/13 0650 10/27/13 0630  NA 130* 134* 136* 134* 133* 133* 137  K 5.0 4.4 4.3 4.9 4.4 4.6 4.6  CL 92* 95* 96 94* 93* 93* 96  CO2 24 28 28 26 26 25 26   GLUCOSE 137* 144* 154* 138* 106* 179* 214*  BUN 42* 31* 24* 42* 32* 51* 37*  CREATININE 5.85* 4.67*  3.89* 5.35* 4.10* 6.02* 4.87*  CALCIUM 8.3* 8.1* 8.3* 8.7 8.5 8.5 8.3*  PHOS 5.4* 4.4 3.7 4.7* 3.9 5.5* 4.5   CBC  Recent Labs Lab 10/24/13 0434 10/25/13 0420 10/26/13 0650 10/27/13 0630  WBC 6.9 6.6 7.0 6.6  HGB 8.0* 8.5* 8.1* 8.6*  HCT 26.5* 28.1* 26.7* 28.3*  MCV 92.0 91.5 90.8 90.4  PLT 356 389 363 356   Medications:    . alteplase  2 mg Intracatheter Once  . aspirin  81 mg Oral Daily  . atorvastatin  80 mg Oral q1800  . calcitRIOL  0.25 mcg Oral Daily  . collagenase   Topical Daily  . darbepoetin (ARANESP) injection - NON-DIALYSIS  200 mcg Subcutaneous Q Wed-1800  . feeding supplement (NEPRO CARB STEADY)  237 mL Oral TID WC  . insulin aspart  0-15 Units Subcutaneous TID WC  . insulin aspart  0-5 Units Subcutaneous QHS  . insulin aspart  4 Units Subcutaneous TID WC  . insulin glargine  20 Units Subcutaneous QHS  . levETIRAcetam  1,000 mg Oral BID  . lidocaine (PF)  5 mL Other Once  . midodrine  10 mg Oral TID WC  . pantoprazole  40 mg Oral Daily  . polyethylene glycol  17 g Oral Daily  . sodium chloride  10-40 mL Intracatheter Q12H  . Warfarin - Pharmacist Dosing Inpatient   Does not apply [id number]   Elmarie Shiley, MD 10/27/2013, 9:12 AM

## 2013-10-27 NOTE — Progress Notes (Signed)
[place] for Coumadin Indication: Thromboembolic state (RUE DVT)  Allergies  Allergen Reactions  . Crestor [Rosuvastatin] Other (See Comments)    Severe muscle weakness  . Nsaids Other (See Comments)    Not allergic, "bad on my kidneys"  . Ciprofloxacin Rash    Patient Measurements: Height: 5' 7.5" (171.5 cm) Weight: 269 lb 10 oz (122.3 kg) IBW/kg (Calculated) : 62.75   Vital Signs: Temp: 98 F (36.7 C) (07/11 1222) Temp src: Oral (07/11 1222) BP: 136/83 mmHg (07/11 1222) Pulse Rate: 92 (07/11 1222)  Labs:  Recent Labs  10/25/13 0420 10/26/13 0650 10/27/13 0630  HGB 8.5* 8.1* 8.6*  HCT 28.1* 26.7* 28.3*  PLT 389 363 356  LABPROT 22.0* 26.1* 31.3*  INR 1.92* 2.39* 3.02*  CREATININE 4.10* 6.02* 4.87*    Estimated Creatinine Clearance: 19.9 ml/min (by C-G formula based on Cr of 4.87).  Assessment: 43yof continues on coumadin for RUE DVT. She has completed 5 days of overlap therapy with argatroban. Argatroban was stopped 6/25 due to therapeutic INR.   INR today 3.02 at top of goal.  She seems to be very sensitive to small dose changes.   Hgb/Hct low stable, plts normal. No overt s/sx of bleeding noted.   Goal of Therapy: INR 2-3 Monitor platelets by anticoagulation protocol: Yes   Plan:  1. Warfarin 2.5mg  tonight  2. Follow-up AM INR 3. Coumadin education today now that out of ICU  [place].D. CPP, BCPS Clinical Pharmacist [phone] 10/27/2013 3:50 PM

## 2013-10-28 DIAGNOSIS — I379 Nonrheumatic pulmonary valve disorder, unspecified: Secondary | ICD-10-CM

## 2013-10-28 LAB — CBC
HEMATOCRIT: 29.2 % — AB (ref 36.0–46.0)
HEMOGLOBIN: 8.8 g/dL — AB (ref 12.0–15.0)
MCH: 27.2 pg (ref 26.0–34.0)
MCHC: 30.1 g/dL (ref 30.0–36.0)
MCV: 90.1 fL (ref 78.0–100.0)
PLATELETS: 362 10*3/uL (ref 150–400)
RBC: 3.24 MIL/uL — ABNORMAL LOW (ref 3.87–5.11)
RDW: 17.3 % — ABNORMAL HIGH (ref 11.5–15.5)
WBC: 7.4 10*3/uL (ref 4.0–10.5)

## 2013-10-28 LAB — GLUCOSE, CAPILLARY
GLUCOSE-CAPILLARY: 214 mg/dL — AB (ref 70–99)
Glucose-Capillary: 169 mg/dL — ABNORMAL HIGH (ref 70–99)
Glucose-Capillary: 211 mg/dL — ABNORMAL HIGH (ref 70–99)
Glucose-Capillary: 156 mg/dL — ABNORMAL HIGH (ref 70–99)

## 2013-10-28 LAB — RENAL FUNCTION PANEL
Albumin: 2.4 g/dL — ABNORMAL LOW (ref 3.5–5.2)
Anion gap: 20 — ABNORMAL HIGH (ref 5–15)
BUN: 55 mg/dL — ABNORMAL HIGH (ref 6–23)
CHLORIDE: 93 meq/L — AB (ref 96–112)
CO2: 20 meq/L (ref 19–32)
Calcium: 8.6 mg/dL (ref 8.4–10.5)
Creatinine, Ser: 6.53 mg/dL — ABNORMAL HIGH (ref 0.50–1.10)
GFR, EST AFRICAN AMERICAN: 8 mL/min — AB (ref >90)
GFR, EST NON AFRICAN AMERICAN: 7 mL/min — AB (ref >90)
GLUCOSE: 193 mg/dL — AB (ref 70–99)
POTASSIUM: 5.1 meq/L (ref 3.7–5.3)
Phosphorus: 5 mg/dL — ABNORMAL HIGH (ref 2.3–4.6)
Sodium: 133 mEq/L — ABNORMAL LOW (ref 137–147)

## 2013-10-28 LAB — PROTIME-INR
INR: 3.31 — ABNORMAL HIGH (ref 0.00–1.49)
Prothrombin Time: 33.6 seconds — ABNORMAL HIGH (ref 11.6–15.2)

## 2013-10-28 MED ORDER — WARFARIN SODIUM 1 MG PO TABS
1.0000 mg | ORAL_TABLET | Freq: Once | ORAL | Status: AC
  Administered 2013-10-28: 1 mg via ORAL
  Filled 2013-10-28: qty 1

## 2013-10-28 MED ORDER — PERFLUTREN LIPID MICROSPHERE
1.0000 mL | INTRAVENOUS | Status: AC | PRN
  Administered 2013-10-28: 2 mL via INTRAVENOUS
  Filled 2013-10-28: qty 10

## 2013-10-28 MED ORDER — OXYCODONE-ACETAMINOPHEN 5-325 MG PO TABS
1.0000 | ORAL_TABLET | ORAL | Status: DC | PRN
  Administered 2013-10-28 – 2013-10-29 (×6): 2 via ORAL
  Filled 2013-10-28 (×6): qty 2

## 2013-10-28 NOTE — Progress Notes (Signed)
[place] for Coumadin Indication: Thromboembolic state (RUE DVT)  Allergies  Allergen Reactions  . Crestor [Rosuvastatin] Other (See Comments)    Severe muscle weakness  . Nsaids Other (See Comments)    Not allergic, "bad on my kidneys"  . Ciprofloxacin Rash    Patient Measurements: Height: 5' 7.5" (171.5 cm) Weight: 270 lb 8.1 oz (122.7 kg) IBW/kg (Calculated) : 62.75   Vital Signs: Temp: 98.2 F (36.8 C) (07/12 1200) Temp src: Oral (07/12 1200) BP: 141/91 mmHg (07/12 0815) Pulse Rate: 97 (07/12 0404)  Labs:  Recent Labs  10/26/13 0650 10/27/13 0630 10/28/13 0907  HGB 8.1* 8.6* 8.8*  HCT 26.7* 28.3* 29.2*  PLT 363 356 362  LABPROT 26.1* 31.3* 33.6*  INR 2.39* 3.02* 3.31*  CREATININE 6.02* 4.87* 6.53*    Estimated Creatinine Clearance: 14.9 ml/min (by C-G formula based on Cr of 6.53).  Assessment: 43yof continues on coumadin for RUE DVT. She has completed 5 days of overlap therapy with argatroban. Argatroban was stopped 6/25 due to therapeutic INR.   INR today 3.31 > goal.  She seems to be very sensitive to small dose changes.   Hgb/Hct low stable, plts normal. No overt s/sx of bleeding noted.   Goal of Therapy: INR 2-3 Monitor platelets by anticoagulation protocol: Yes   Plan:  1. Warfarin 1 mg tonight  2. Follow-up AM INR 3. Coumadin education today now that out of ICU  [place].D. CPP, BCPS Clinical Pharmacist [phone] 10/28/2013 3:53 PM

## 2013-10-28 NOTE — Progress Notes (Signed)
Patient ID: Hannah [name], female   DOB: 1971/02/23, 43 y.o.   MRN: [id number]  [facility] Progress Note   Assessment/ Plan:   1. ESRD- (dialysis dependent since 5/21)on a Monday/Wednesday/Friday schedule. Plan for hemodialysis on tomorrow prior to discharge-she is to start at [facility] on 7/15.  2. 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 3. Anemia of chronic disease- on aranesp 211mcg weekly 4. Right axillary DVT: on coumadin per pharmacy 5. Vascular access- HD via [facility], AVF/AVG when current cardiac instability improves (patient informs me that Dr.Brabham intends to manage carotid stenosis prior to HD access creation) 6. DM- on insulin 7. S/p right femoral endarterectomy/fasciotomy with wound dehiscence s/p Wound vac- removed yesterday (episode of vascular insufficiency with cold foot after IABP)  Subjective:   Reports to have had a good night and looks forward to leaving tomorrow   Objective:   BP 141/91  Pulse 97  Temp(Src) 98.8 F (37.1 C) (Oral)  Resp 16  Ht 5' 7.5" (1.715 m)  Wt 122.7 kg (270 lb 8.1 oz)  BMI 41.72 kg/m2  SpO2 99%  LMP 08/19/2013  Physical Exam: PFX:[id number] eating breakfast this AM DJM:EQAST RRR, normal S1 and S2 Resp:Decreased left base BS, No rales/rhonchi MHD:QQIW, obese, NT, BS normal LNL:GXQJJ-9+ LE edema. Dressing over right leg/groin  Labs: BMET  Recent Labs Lab 10/22/13 0400 10/23/13 0334 10/24/13 0434 10/25/13 0420 10/26/13 0650 10/27/13 0630  NA 134* 136* 134* 133* 133* 137  K 4.4 4.3 4.9 4.4 4.6 4.6  CL 95* 96 94* 93* 93* 96  CO2 28 28 26 26 25 26   GLUCOSE 144* 154* 138* 106* 179* 214*  BUN 31* 24* 42* 32* 51* 37*  CREATININE 4.67* 3.89* 5.35* 4.10* 6.02* 4.87*  CALCIUM 8.1* 8.3* 8.7 8.5 8.5 8.3*  PHOS 4.4 3.7 4.7* 3.9 5.5* 4.5   CBC  Recent Labs Lab 10/24/13 0434 10/25/13 0420  10/26/13 0650 10/27/13 0630  WBC 6.9 6.6 7.0 6.6  HGB 8.0* 8.5* 8.1* 8.6*  HCT 26.5* 28.1* 26.7* 28.3*  MCV 92.0 91.5 90.8 90.4  PLT 356 389 363 356   Medications:    . alteplase  2 mg Intracatheter Once  . aspirin  81 mg Oral Daily  . atorvastatin  80 mg Oral q1800  . calcitRIOL  0.25 mcg Oral Daily  . collagenase   Topical Daily  . darbepoetin (ARANESP) injection - NON-DIALYSIS  200 mcg Subcutaneous Q Wed-1800  . feeding supplement (NEPRO CARB STEADY)  237 mL Oral TID WC  . insulin aspart  0-15 Units Subcutaneous TID WC  . insulin aspart  0-5 Units Subcutaneous QHS  . insulin aspart  4 Units Subcutaneous TID WC  . insulin glargine  20 Units Subcutaneous QHS  . lidocaine (PF)  5 mL Other Once  . midodrine  10 mg Oral TID WC  . pantoprazole  40 mg Oral Daily  . polyethylene glycol  17 g Oral Daily  . sodium chloride  10-40 mL Intracatheter Q12H  . Warfarin - Pharmacist Dosing Inpatient   Does not apply [id number]   Elmarie Shiley, MD 10/28/2013, 8:53 AM

## 2013-10-28 NOTE — Progress Notes (Signed)
  Echocardiogram 2D Echocardiogram has been performed.  Darlina Sicilian M 10/28/2013, 12:42 PM

## 2013-10-28 NOTE — Progress Notes (Addendum)
Patient ID: Hannah [name], female   DOB: Nov 29, 1970, 43 y.o.   MRN: [id number]  ADVANCED HEART FAILURE ROUNDING NOTE   SUBJECTIVE  43 yo female with PMH of DM1, stage V kidney disease, blindness, h/o lung CA s/p resection who was recently discharged for heart failure symptom had a high risk stress test which showed ischemia in anterior and apical region. She underwent cath 09/04/2013 which showed chronically occluded RCA and triple vessel dx. CT surgery consulted but not felt to be surgical candidate due to poor PFTs, suboptimal targets, comorbidities and shock. EF 22% by Myoview. 30-35% by echo  Underwent placement of swan and IABP for shock (co-ox 38%). Unfortunately developed cold foot and IABP had to be removed.Underwent R femoral thrombectomy and 4 compartment fasciotomy of RLE on 6/1.On 6/3 had VT/VF->PEA arrest 10 mins CPR. U/s shows R axillary DVT.   Now on intermittent HD. Dobutamine stopped several days ago.  CO-OX borderline low but stable 54%->52% as of yesterday. Still pending for today . Feels great. Weight stable  Denies CP or SOB or dizziness. BP stable.  VVS feels wounds stable for d/c.     CURRENT MEDS . alteplase  2 mg Intracatheter Once  . aspirin  81 mg Oral Daily  . atorvastatin  80 mg Oral q1800  . calcitRIOL  0.25 mcg Oral Daily  . collagenase   Topical Daily  . darbepoetin (ARANESP) injection - NON-DIALYSIS  200 mcg Subcutaneous Q Wed-1800  . feeding supplement (NEPRO CARB STEADY)  237 mL Oral TID WC  . insulin aspart  0-15 Units Subcutaneous TID WC  . insulin aspart  0-5 Units Subcutaneous QHS  . insulin aspart  4 Units Subcutaneous TID WC  . insulin glargine  20 Units Subcutaneous QHS  . levETIRAcetam  500 mg Oral BID  . lidocaine (PF)  5 mL Other Once  . midodrine  10 mg Oral TID WC  . pantoprazole  40 mg Oral Daily  . polyethylene glycol  17 g Oral Daily  . sodium chloride  10-40 mL Intracatheter Q12H  . Warfarin - Pharmacist Dosing Inpatient   Does not  apply q1800    OBJECTIVE  Filed Vitals:   10/27/13 1622 10/27/13 2000 10/27/13 2304 10/28/13 0404  BP: 115/62  124/68 119/67  Pulse: 94 93 100 97  Temp: 98.6 F (37 C) 98.3 F (36.8 C) 98.3 F (36.8 C) 97.9 F (36.6 C)  TempSrc: Oral Oral Oral Oral  Resp: 12 18 19    Height:      Weight:    122.7 kg (270 lb 8.1 oz)  SpO2: 95% 96% 100% 100%    Intake/Output Summary (Last 24 hours) at 10/28/13 0753 Last data filed at 10/27/13 1800  Gross per 24 hour  Intake    980 ml  Output      0 ml  Net    980 ml   Filed Weights   10/26/13 1320 10/26/13 1720 10/28/13 0404  Weight: 124.5 kg (274 lb 7.6 oz) 122.3 kg (269 lb 10 oz) 122.7 kg (270 lb 8.1 oz)    PHYSICAL EXAM  General: Sitting in bedNAD Neuro: Alert and oriented X 3. HEENT:  Normal  Neck: Supple without bruits. Chest L subclav PC. R subclav TLC.  Lungs:  clear Heart: tachy regular.  Abdomen: Soft, non-tender, non-distended, BS + x 4.  Extremities: 1-2+ edema bilateral ankles. R groin and RLE wounds  CBC  Recent Labs  10/26/13 0650 10/27/13 0630  WBC 7.0 6.6  HGB 8.1* 8.6*  HCT 26.7* 28.3*  MCV 90.8 90.4  PLT 363 829   Basic Metabolic Panel  Recent Labs  10/26/13 0650 10/27/13 0630  NA 133* 137  K 4.6 4.6  CL 93* 96  CO2 25 26  GLUCOSE 179* 214*  BUN 51* 37*  CREATININE 6.02* 4.87*  CALCIUM 8.5 8.3*  MG 2.3 2.1  PHOS 5.5* 4.5   Liver Function Tests  Recent Labs  10/26/13 0650 10/27/13 0630  ALBUMIN 2.4* 2.3*   Cardiac Enzymes No results found for this basename: CKTOTAL, CKMB, CKMBINDEX, TROPONINI,  in the last 72 hours Thyroid Function Tests No results found for this basename: TSH, T4TOTAL, FREET3, T3FREE, THYROIDAB,  in the last 72 hours  TELE  Sinus tachycardia 100-110   Radiology/Studies  Dg Chest 2 View  09/06/2013   CLINICAL DATA:  CHF, intermittent dyspnea  EXAM: CHEST  2 VIEW  COMPARISON:  Prior chest x-ray 08/13/2013; prior chest CT 08/22/2013  FINDINGS: Stable cardiac and  mediastinal contours. Atherosclerotic calcifications are present within the transverse aorta. Moderate left and small right pleural effusions are similar compared to prior. There is persistent associated bibasilar atelectasis. Mild pulmonary vascular congestion without overt edema. No pneumothorax. No new focal airspace consolidation. No acute osseous abnormality.  IMPRESSION: 1. Stable moderate left and small right pleural effusions and associated bibasilar atelectasis. 2. Pulmonary vascular congestion without overt edema.   Electronically Signed   By: Jacqulynn Cadet M.D.   On: 09/06/2013 07:55   Ct Chest Wo Contrast  08/22/2013   CLINICAL DATA:  Left lung cancer status post lobectomy. Weight gain. Renal insufficiency.  EXAM: CT CHEST WITHOUT CONTRAST  TECHNIQUE: Multidetector CT imaging of the chest was performed following the standard protocol without IV contrast.  COMPARISON:  Radiographs 08/13/2013 and 08/04/2013.  CT 08/02/2012.  FINDINGS: There are stable postsurgical changes related to prior left lower lobe resection. There has been interval enlargement of several mediastinal lymph nodes. These include 12 mm right paratracheal (image 19), 13 mm precarinal (image 23), and 14 mm AP window (image 25) lymph nodes. Some of these nodes have retained fatty hila. Allowing for the limitations of noncontrast technique, the hila appear stable.  There is stable low-density within the right thyroid lobe. Atherosclerosis of the aorta, great vessels and coronary arteries is noted. The heart size is normal. There is no pericardial effusion.  Moderate size dependent pleural effusions are present bilaterally. There is associated dependent airspace disease in both lower lobes. In addition, there are more focal airspace opacities within the right lower lobe and inferior aspect of the left upper lobe. The latter is somewhat nodular, measuring up to 1.8 cm on image 43. No other focal nodularity or endobronchial lesions are  demonstrated.  The visualized upper abdomen has a stable appearance. There is no adrenal mass. There is increased subcutaneous edema throughout the subcutaneous fat, especially within the anterior aspect of the upper abdomen.  No worrisome osseous findings are demonstrated.  IMPRESSION: 1. As demonstrated radiographically, there are bilateral pleural effusions and bilateral airspace opacities which are new compared with the prior CT. Although there are focal somewhat nodular components in the right lower and left upper lobes, these findings are most likely infectious/inflammatory. 2. Progressive mediastinal lymphadenopathy. Some of the nodes have retained fatty hila and may be reactive. Metastatic disease cannot be completely excluded. 3. Stable atherosclerosis and low-density within the right thyroid lobe. 4. If the airspace opacities and pleural effusions fail to respond to appropriate clinical  therapy, follow-up CT or PET-CT may be warranted to exclude metastatic disease.   Electronically Signed   By: Camie Patience M.D.   On: 08/22/2013 10:26   Dg Chest Portable 1 View  08/13/2013   CLINICAL DATA:  Shortness of breath increasing for 2 weeks. History of CHF.  EXAM: PORTABLE CHEST - 1 VIEW  COMPARISON:  08/04/2013  FINDINGS: Again noted are bibasilar lung densities that are suggestive for pleural effusions and atelectasis. Heart size appears to be enlarged. There is some peribronchial thickening and cannot exclude pulmonary edema.  IMPRESSION: Bilateral pleural effusions with basilar atelectasis. There is mild pulmonary edema. Minimal change from the previous examination.   Electronically Signed   By: Markus Daft M.D.   On: 08/13/2013 23:10    ASSESSMENT AND PLAN 1. Cardiogenic shock 2. Acute on chronic systolic HF     EF ~37-48% 3. 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 Acute respiratory failure 5. 3-V CAD, severe.  6. Carotid stenosis     - doppler 08/31/2013 shows R 60-79% ICA stenosis, L 80-99% ICA stenosis     -  vascular surgery aware 7. 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 DM1 9. Ischemic R foot s/p R femoral thrombectomy and 4-compartment fasciotomy.  10. VT/VF arrest 6/3 11. R axillary DVT 12. Severe protein calorie malnutrition  13. Large right groin wound s/p debridement 6/12 14. Anemia: Transfuse hemoglobin < 7.  15. 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 Co-ox marginal but stable.  Will continue midodrine at current dose.   Myoclonus totally resolved after stopping dobutamine. Will wean Keppra to off today.   Continues to ask for Fentanyl q2h for groin pain. Will stop this. Continue Ultram. Percocet for breakthrough.   Warfarin dosing d/w Pharmacy.   Continue to manage HF and CAD medically. Therapy limited by hypotension but may be able to consider getting low-dose therapy on board. Consider low-dose carvedilol on non-HD days at clinic appt.   Continue PT.   Hopefully home tomorrow with HHRN/PT after HD.   Avaiyah Strubel,MD 7:53 AM

## 2013-10-29 ENCOUNTER — Telehealth: Payer: Self-pay | Admitting: Surgery

## 2013-10-29 LAB — RENAL FUNCTION PANEL
Albumin: 2.5 g/dL — ABNORMAL LOW (ref 3.5–5.2)
Anion gap: 19 — ABNORMAL HIGH (ref 5–15)
BUN: 66 mg/dL — AB (ref 6–23)
CALCIUM: 8.8 mg/dL (ref 8.4–10.5)
CO2: 23 mEq/L (ref 19–32)
Chloride: 91 mEq/L — ABNORMAL LOW (ref 96–112)
Creatinine, Ser: 7.57 mg/dL — ABNORMAL HIGH (ref 0.50–1.10)
GFR calc Af Amer: 7 mL/min — ABNORMAL LOW (ref >90)
GFR calc non Af Amer: 6 mL/min — ABNORMAL LOW (ref >90)
Glucose, Bld: 173 mg/dL — ABNORMAL HIGH (ref 70–99)
PHOSPHORUS: 6 mg/dL — AB (ref 2.3–4.6)
Potassium: 5.3 mEq/L (ref 3.7–5.3)
Sodium: 133 mEq/L — ABNORMAL LOW (ref 137–147)

## 2013-10-29 LAB — CBC
HEMATOCRIT: 28.3 % — AB (ref 36.0–46.0)
Hemoglobin: 8.6 g/dL — ABNORMAL LOW (ref 12.0–15.0)
MCH: 27.4 pg (ref 26.0–34.0)
MCHC: 30.4 g/dL (ref 30.0–36.0)
MCV: 90.1 fL (ref 78.0–100.0)
Platelets: 356 10*3/uL (ref 150–400)
RBC: 3.14 MIL/uL — ABNORMAL LOW (ref 3.87–5.11)
RDW: 17.6 % — AB (ref 11.5–15.5)
WBC: 7.5 10*3/uL (ref 4.0–10.5)

## 2013-10-29 LAB — PROTIME-INR
INR: 3.02 — ABNORMAL HIGH (ref 0.00–1.49)
Prothrombin Time: 31.3 seconds — ABNORMAL HIGH (ref 11.6–15.2)

## 2013-10-29 LAB — GLUCOSE, CAPILLARY
GLUCOSE-CAPILLARY: 112 mg/dL — AB (ref 70–99)
Glucose-Capillary: 140 mg/dL — ABNORMAL HIGH (ref 70–99)

## 2013-10-29 MED ORDER — OXYCODONE-ACETAMINOPHEN 5-325 MG PO TABS: 1.0000 | ORAL_TABLET | Freq: Three times a day (TID) | ORAL | Status: DC | PRN

## 2013-10-29 MED ORDER — PANTOPRAZOLE SODIUM 40 MG PO TBEC: 40.0000 mg | DELAYED_RELEASE_TABLET | Freq: Every day | ORAL | Status: DC

## 2013-10-29 MED ORDER — COLLAGENASE 250 UNIT/GM EX OINT: TOPICAL_OINTMENT | Freq: Every day | CUTANEOUS | Status: DC

## 2013-10-29 MED ORDER — INSULIN LISPRO 100 UNIT/ML (KWIKPEN): 5.0000 [IU] | PEN_INJECTOR | Freq: Three times a day (TID) | SUBCUTANEOUS | Status: DC

## 2013-10-29 MED ORDER — WARFARIN SODIUM 2 MG PO TABS
2.0000 mg | ORAL_TABLET | Freq: Every day | ORAL | Status: DC
  Administered 2013-10-29: 2 mg via ORAL
  Filled 2013-10-29: qty 1

## 2013-10-29 MED ORDER — CALCITRIOL 0.25 MCG PO CAPS: 0.2500 ug | ORAL_CAPSULE | Freq: Every day | ORAL | Status: DC

## 2013-10-29 MED ORDER — ATORVASTATIN CALCIUM 80 MG PO TABS: 80.0000 mg | ORAL_TABLET | Freq: Every day | ORAL | Status: AC

## 2013-10-29 MED ORDER — WARFARIN SODIUM 2 MG PO TABS: 2.0000 mg | ORAL_TABLET | Freq: Every day | ORAL | Status: DC

## 2013-10-29 MED ORDER — MIDODRINE HCL 10 MG PO TABS: 10.0000 mg | ORAL_TABLET | Freq: Three times a day (TID) | ORAL | Status: DC

## 2013-10-29 MED ORDER — ONDANSETRON HCL 4 MG PO TABS: 4.0000 mg | ORAL_TABLET | Freq: Three times a day (TID) | ORAL | Status: DC | PRN

## 2013-10-29 MED ORDER — POLYETHYLENE GLYCOL 3350 17 G PO PACK: 17.0000 g | PACK | Freq: Every day | ORAL | Status: AC | PRN

## 2013-10-29 MED ORDER — INSULIN DETEMIR 100 UNIT/ML FLEXPEN: 20.0000 [IU] | PEN_INJECTOR | Freq: Every evening | SUBCUTANEOUS | Status: AC

## 2013-10-29 MED ORDER — ONDANSETRON HCL 4 MG/2ML IJ SOLN
INTRAMUSCULAR | Status: AC
  Filled 2013-10-29: qty 2

## 2013-10-29 NOTE — Progress Notes (Signed)
Patient ID: Hannah [name], female   DOB: 1971/03/21, 43 y.o.   MRN: [id number] ADVANCED HEART FAILURE ROUNDING NOTE   SUBJECTIVE  43 yo female with PMH of DM1, stage V kidney disease, blindness, h/o lung CA s/p resection who was recently discharged for heart failure symptom had a high risk stress test which showed ischemia in anterior and apical region. She underwent cath 09/04/2013 which showed chronically occluded RCA and triple vessel dx. CT surgery consulted but not felt to be surgical candidate due to poor PFTs, suboptimal targets, comorbidities and shock. EF 22% by Myoview. 30-35% by echo  Underwent placement of swan and IABP for shock (co-ox 38%). Unfortunately developed cold foot and IABP had to be removed.Underwent R femoral thrombectomy and 4 compartment fasciotomy of RLE on 6/1.On 6/3 had VT/VF->PEA arrest 10 mins CPR. U/s shows R axillary DVT.   Now on intermittent HD. Dobutamine stopped several days ago.  CO-OX borderline low but stable 54%->52% last check. Feels great. Weight stable  Denies CP or SOB or dizziness. BP stable.  VVS feels wounds stable for d/c. She will have HD today.     CURRENT MEDS . alteplase  2 mg Intracatheter Once  . aspirin  81 mg Oral Daily  . atorvastatin  80 mg Oral q1800  . calcitRIOL  0.25 mcg Oral Daily  . collagenase   Topical Daily  . darbepoetin (ARANESP) injection - NON-DIALYSIS  200 mcg Subcutaneous Q Wed-1800  . feeding supplement (NEPRO CARB STEADY)  237 mL Oral TID WC  . insulin aspart  0-15 Units Subcutaneous TID WC  . insulin aspart  0-5 Units Subcutaneous QHS  . insulin aspart  4 Units Subcutaneous TID WC  . insulin glargine  20 Units Subcutaneous QHS  . lidocaine (PF)  5 mL Other Once  . midodrine  10 mg Oral TID WC  . pantoprazole  40 mg Oral Daily  . polyethylene glycol  17 g Oral Daily  . sodium chloride  10-40 mL Intracatheter Q12H  . Warfarin - Pharmacist Dosing Inpatient   Does not apply q1800    OBJECTIVE  Filed Vitals:    10/28/13 1643 10/28/13 2032 10/28/13 2325 10/29/13 0311  BP: 121/63 103/67 108/67   Pulse:  92 92 92  Temp: 98 F (36.7 C) 98 F (36.7 C) 98.1 F (36.7 C) 98 F (36.7 C)  TempSrc: Oral Oral Oral Oral  Resp: 18     Height:      Weight:    123.6 kg (272 lb 7.8 oz)  SpO2: 96% 100% 100% 100%    Intake/Output Summary (Last 24 hours) at 10/29/13 0750 Last data filed at 10/28/13 2244  Gross per 24 hour  Intake    720 ml  Output      0 ml  Net    720 ml   Filed Weights   10/26/13 1720 10/28/13 0404 10/29/13 0311  Weight: 122.3 kg (269 lb 10 oz) 122.7 kg (270 lb 8.1 oz) 123.6 kg (272 lb 7.8 oz)    PHYSICAL EXAM  General: Sitting in bedNAD Neuro: Alert and oriented X 3. HEENT:  Normal  Neck: Supple without bruits. Chest L subclav PC. R subclav TLC. No JVD.  Lungs:  clear Heart: tachy regular.  Abdomen: Soft, non-tender, non-distended, BS + x 4.  Extremities: 1-2+ edema bilateral ankles. R groin and RLE wounds  CBC  Recent Labs  10/28/13 0907 10/29/13 0305  WBC 7.4 7.5  HGB 8.8* 8.6*  HCT 29.2* 28.3*  MCV 90.1 90.1  PLT 362 132   Basic Metabolic Panel  Recent Labs  10/27/13 0630 10/28/13 0907 10/29/13 0305  NA 137 133* 133*  K 4.6 5.1 5.3  CL 96 93* 91*  CO2 26 20 23   GLUCOSE 214* 193* 173*  BUN 37* 55* 66*  CREATININE 4.87* 6.53* 7.57*  CALCIUM 8.3* 8.6 8.8  MG 2.1  --   --   PHOS 4.5 5.0* 6.0*   Liver Function Tests  Recent Labs  10/28/13 0907 10/29/13 0305  ALBUMIN 2.4* 2.5*   Cardiac Enzymes No results found for this basename: CKTOTAL, CKMB, CKMBINDEX, TROPONINI,  in the last 72 hours Thyroid Function Tests No results found for this basename: TSH, T4TOTAL, FREET3, T3FREE, THYROIDAB,  in the last 72 hours  TELE  Sinus tachycardia 100-110   Radiology/Studies  Dg Chest 2 View  09/06/2013   CLINICAL DATA:  CHF, intermittent dyspnea  EXAM: CHEST  2 VIEW  COMPARISON:  Prior chest x-ray 08/13/2013; prior chest CT 08/22/2013  FINDINGS: Stable  cardiac and mediastinal contours. Atherosclerotic calcifications are present within the transverse aorta. Moderate left and small right pleural effusions are similar compared to prior. There is persistent associated bibasilar atelectasis. Mild pulmonary vascular congestion without overt edema. No pneumothorax. No new focal airspace consolidation. No acute osseous abnormality.  IMPRESSION: 1. Stable moderate left and small right pleural effusions and associated bibasilar atelectasis. 2. Pulmonary vascular congestion without overt edema.   Electronically Signed   By: Jacqulynn Cadet M.D.   On: 09/06/2013 07:55   Ct Chest Wo Contrast  08/22/2013   CLINICAL DATA:  Left lung cancer status post lobectomy. Weight gain. Renal insufficiency.  EXAM: CT CHEST WITHOUT CONTRAST  TECHNIQUE: Multidetector CT imaging of the chest was performed following the standard protocol without IV contrast.  COMPARISON:  Radiographs 08/13/2013 and 08/04/2013.  CT 08/02/2012.  FINDINGS: There are stable postsurgical changes related to prior left lower lobe resection. There has been interval enlargement of several mediastinal lymph nodes. These include 12 mm right paratracheal (image 19), 13 mm precarinal (image 23), and 14 mm AP window (image 25) lymph nodes. Some of these nodes have retained fatty hila. Allowing for the limitations of noncontrast technique, the hila appear stable.  There is stable low-density within the right thyroid lobe. Atherosclerosis of the aorta, great vessels and coronary arteries is noted. The heart size is normal. There is no pericardial effusion.  Moderate size dependent pleural effusions are present bilaterally. There is associated dependent airspace disease in both lower lobes. In addition, there are more focal airspace opacities within the right lower lobe and inferior aspect of the left upper lobe. The latter is somewhat nodular, measuring up to 1.8 cm on image 43. No other focal nodularity or endobronchial  lesions are demonstrated.  The visualized upper abdomen has a stable appearance. There is no adrenal mass. There is increased subcutaneous edema throughout the subcutaneous fat, especially within the anterior aspect of the upper abdomen.  No worrisome osseous findings are demonstrated.  IMPRESSION: 1. As demonstrated radiographically, there are bilateral pleural effusions and bilateral airspace opacities which are new compared with the prior CT. Although there are focal somewhat nodular components in the right lower and left upper lobes, these findings are most likely infectious/inflammatory. 2. Progressive mediastinal lymphadenopathy. Some of the nodes have retained fatty hila and may be reactive. Metastatic disease cannot be completely excluded. 3. Stable atherosclerosis and low-density within the right thyroid lobe. 4. If the airspace opacities and  pleural effusions fail to respond to appropriate clinical therapy, follow-up CT or PET-CT may be warranted to exclude metastatic disease.   Electronically Signed   By: Camie Patience M.D.   On: 08/22/2013 10:26   Dg Chest Portable 1 View  08/13/2013   CLINICAL DATA:  Shortness of breath increasing for 2 weeks. History of CHF.  EXAM: PORTABLE CHEST - 1 VIEW  COMPARISON:  08/04/2013  FINDINGS: Again noted are bibasilar lung densities that are suggestive for pleural effusions and atelectasis. Heart size appears to be enlarged. There is some peribronchial thickening and cannot exclude pulmonary edema.  IMPRESSION: Bilateral pleural effusions with basilar atelectasis. There is mild pulmonary edema. Minimal change from the previous examination.   Electronically Signed   By: Markus Daft M.D.   On: 08/13/2013 23:10    ASSESSMENT AND PLAN 1. Cardiogenic shock 2. Acute on chronic systolic HF     EF ~55-73% 3. ESRD now on HD 4. Acute respiratory failure 5. 3-V CAD, severe.  6. Carotid stenosis     - doppler 08/31/2013 shows R 60-79% ICA stenosis, L 80-99% ICA stenosis      - vascular surgery aware 7. 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 DM1 9. Ischemic R foot s/p R femoral thrombectomy and 4-compartment fasciotomy.  10. VT/VF arrest 6/3 11. R axillary DVT 12. Severe protein calorie malnutrition  13. Large right groin wound s/p debridement 6/12 14. Anemia: Transfuse hemoglobin < 7.  15. HITT   BP stable and she is tolerating iHD off dobutamine. Co-ox marginal but stable.  Will continue midodrine at current dose and repeat co-ox today.   Myoclonus totally resolved after stopping dobutamine. Now off Keppra as well.   Will have Ultram for pain control with prn Percocet.   Warfarin to continue.    Continue to manage HF and CAD medically. Therapy limited by hypotension but may be able to consider getting low-dose therapy on board. Consider low-dose carvedilol on non-HD days at clinic appt.   Continue PT.   May go home today after HD.  She will have followup in CHF clinic next week.  She will need INR followup in coumadin clinic, can do this in the [facility] cardiology office.  HD to be arranged by nephrology.  She will need home health and PT. Cardiac meds: ASA 81, atorvastatin 80 daily, midodrine 10 tid, warfarin.     Toddy Boyd,MD 7:50 AM 10/29/2013

## 2013-10-29 NOTE — Progress Notes (Signed)
Report given to HD nurse

## 2013-10-29 NOTE — Progress Notes (Signed)
[place] for Coumadin Indication: Thromboembolic state (RUE DVT)  Allergies  Allergen Reactions  . Crestor [Rosuvastatin] Other (See Comments)    Severe muscle weakness  . Nsaids Other (See Comments)    Not allergic, "bad on my kidneys"  . Ciprofloxacin Rash    Patient Measurements: Height: 5' 7.5" (171.5 cm) Weight: 272 lb 7.8 oz (123.6 kg) IBW/kg (Calculated) : 62.75   Vital Signs: Temp: 98 F (36.7 C) (07/13 0311) Temp src: Oral (07/13 0311) BP: 108/67 mmHg (07/12 2325) Pulse Rate: 92 (07/13 0311)  Labs:  Recent Labs  10/27/13 0630 10/28/13 0907 10/29/13 0305  HGB 8.6* 8.8* 8.6*  HCT 28.3* 29.2* 28.3*  PLT 356 362 356  LABPROT 31.3* 33.6* 31.3*  INR 3.02* 3.31* 3.02*  CREATININE 4.87* 6.53* 7.57*    Estimated Creatinine Clearance: 12.9 ml/min (by C-G formula based on Cr of 7.57).  Assessment: 43yof continues on coumadin for RUE DVT. She has completed 5 days of overlap therapy with argatroban. Argatroban was stopped 6/25 due to therapeutic INR.   INR today 3.0 at upper end of goal. Small dose of 1mg  given last night. She seems to be very sensitive to small dose changes. Hgb/Hct low stable, plts normal. No overt s/sx of bleeding noted.   Very difficult at this point to predict a good regimen for Hannah [name] given fluctuating INR and boost dosing resulting in large elevations. Will discuss with HF NP and suggest 2mg  daily and recheck INR later this week if able and hope INR can stabilze as outpatient.  Goal of Therapy: INR 2-3 Monitor platelets by anticoagulation protocol: Yes   Plan:  1. Warfarin 2 mg daily 2. Follow-up INR as outpt later this week in coumadin clinic in [place] 3. Coumadin education completed  Erin Hearing PharmD., BCPS Clinical Pharmacist Pager [phone] 10/29/2013 11:32 AM

## 2013-10-29 NOTE — Progress Notes (Signed)
D/c instructions reviewed with pt and her husband, copy of instructions and printed scripts were given to pt (pt has other scripts sent to her pharmacy). Pt has information and # to outpatient dialysis center and will have next HD treatment on Wednesday. Pt able to do teach back with CHF teaching, pt was being seen for CHF prior to this admit and able to review steps of when to call MD. Wound care provided after dialysis to R groin wound and to R leg (husband was present during leg wound care and he was able to see the wounds and briefly explained wound care to him, [facility] will be doing wound care at home)   R lateral leg wound (upper area) NS gauze dressing was packed/tucked into upper corner and rest laid into wound bed. See dsg change documentation.  Wheelchair and cushion delivered to pt's room by [facility]. Pt d/c'd via her wheelchair with husband with belongings.  (pt's husband was taking pt by another unit before leaving hospital).

## 2013-10-29 NOTE — Procedures (Signed)
I was present at this dialysis session. I have reviewed the session itself and made appropriate changes.   Doing well in chair. For DC today.  TDC.  Goal UF 4.5L.  Discussed Na restriction.  Pearson Grippe  MD 10/29/2013, 8:46 AM

## 2013-10-29 NOTE — Telephone Encounter (Signed)
Left message for patient  Re appt, dpm

## 2013-10-29 NOTE — Progress Notes (Addendum)
PT Cancellation Note  Patient Details Name: Hannah [name] MRN: [id number] DOB: Nov 08, 1970   Cancelled Treatment:    Reason Eval/Treat Not Completed: Medical issues which prohibited therapy. Pt is on bed rest and is planning to d/c later this afternoon. Discussed need for w/c at discharge as pt is concerned about being able to get to doctor's appointments and out in the community. NP and RN aware of need for w/c, and RN states she will be in touch with care management regarding this.    Jolyn Lent 10/29/2013, 3:59 PM  Jolyn Lent, PT, DPT Acute Rehabilitation Services Pager: [phone]

## 2013-10-29 NOTE — Progress Notes (Signed)
Unable to draw carboxyhemoglobin lab secondary to inability to draw blood back fm subclavian CVL

## 2013-10-29 NOTE — Progress Notes (Signed)
Pt c/o nausea--- no vomiting noted. Pt states zofran doesn't work for her. Pt requests phenegran IV. Pt has been tolerating eating and drinking while waiting for phenegran. Phenegran 12.5 mg IV given   Pt admits that she cant sleep without sleeping medication and asking for something to help her sleep

## 2013-10-29 NOTE — Telephone Encounter (Signed)
Message copied by Gena Fray on Mon Oct 29, 2013 10:35 AM ------      Message from: Peter Minium K      Created: Mon Oct 29, 2013  9:20 AM      Regarding: Schedule       3 Weeks w/ [facility]            ----- Message -----         From: Gabriel Earing, PA-C         Sent: 10/27/2013   9:11 AM           To: Vvs Charge Pool            S/p      Right femoral endarterectomy, right femoral thrombectomy, four compartment fasciotomy        40 Days Post-Op        and        Closure right leg fasciotomy        38 Days Post-Op        And        I&D right groin with placement of wound vac        29 Days Post-Op            F/u with Dr. Trula Slade in 3 weeks.       ------

## 2013-10-29 NOTE — Discharge Summary (Signed)
Advanced Heart Failure Team  Discharge Summary   Patient ID: Hannah [name] MRN: [id number], DOB/AGE: [birth year] 43 y.o. Admit date: 09/03/2013 D/C date:     10/29/2013   Primary Discharge Diagnoses:  1. Cardiogenic shock 2. Acute on chronic systolic heart failure due to Ischemic CM     --EF 25-30% on echo 10/28/13 3. Acute on chronic renal failure -> now ESRD 4. Cardiac arrest - VT/VF 5. 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 3v CAD, non-revascularizable 7 Type I DM 8. RLE and groin wounds 9. Carotid artery stenosis      --Right 60-79%      --Left  80-99% 10. Acute hypoxic respiratory failure requiring temporary mechanical ventilation  Secondary Discharge Diagnoses:  1. Myoclonic jerking 2. Physical deconditioning 3. Protein-calorie malnutrition 4. Lung CA s/p previous resection  5. Severe lung disease with PFTs on 5/20      -FEV1 0.92 (28%)      -FVC 1.36 (34%)      -DLCO 34%  Hospital Course:   Ms. Machnik is a 43 y/o woman with type I diabetes complicated by nephropathy, retinopathy and severe vascular disease. In 2013 she had a cardiac cath which showed moderate CAD and a mildly decreased EF. In mid May 2015, she was in the midst of workup for AV fistula surgery. An outpatient stress test was ordered. This was done on May 18 and revealed anterior and apical ischemia with an ejection fraction of 22%. She was referred for admission due to the high-risk nature of her stress test and progressive HF symptoms with volume overload. On admission, her creatinine was 4.1 and the decision was made to proceed with cardiac catheterization despite the risk of contrast nephropathy given the high-risk nature of her stress test and the fact that she was felt to be nearing ESRD anyway.   Cardiac cath was done on 5/18 revealed 3v CAD with diabetic vasculopathy. LVEDP was 38. Echo showed an EF 30-35%. She was also found to have  bilateral carotid stenosis with a 60-79% lesion on the right and a high-grade 80-99% lesion on the left. She was seen by Dr. Prescott Gum for possible CABG. Given her marginal status for CABG and decompensated HF, Dr. Prescott Gum consulted the advanced HF team to assist with management.   Several days post-cath she decompensated with respiratory failure in the setting of progressive volume overload and cardiogenic shock. She was started on inotropes and moved to the ICU and a Trialysis catheter was placed for CVVHD. Almost 40 pounds of fluid was removed. Unfortunately she became progressively hypotensive and had increased pressor requirements. On 5/28 she had a co-ox of 38% and was taken to the cath lab for RHC and placement of Swan Ganz catheter which conformed cardiogenic shock and an IABP was palced. With the IABP in place she developed an ischemic right foot and the pump had to be removed urgently. [facility]VS was consulted. She was taken to the OR on 6/1 for femoral thromboembolectomy and 4-compartment fasciotomy of her RLE. She was started on broad spectrum abx. Both her groin and facsciotomy wounds were healing slowly so wound vacs were placed and these were followed by [facility] and wound care. She had several debridements.  She continued to struggle with cardiogenic shock and profound hypotension and was maintained on very high dose pressors including milrinone and levophed at doses up to almost 132mcg/hr. Milrinone was eventually changed to dobutamine. On 6/3 she had VT/VF arrest and  was intubated. Several long talks were had with her and her family about the tenuous nature of the situation and consideration of palliative care but Ms. Oswald was adamant that she wanted to press on. She was treated with amiodarone in the hospital briefly for VT as well as a severe sinus tach with resting HRs in the 140s. This was eventually weaned off.  She subsequently developed thrombocytopenia and a RUE DVT at cite of her triple  lumen catheter. She also repeatedly clotted off her HD catheters. A hematology consulted was called. 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 She was switched to argatroban and eventually converted to coumadin with resolution of her prothrombotic state.   Over the next several weeks she remained on CVVHD and we slowly and painstakingly weaned off her inotropes as her BP tolerated. (systolics often dipping into 70s). We started midodrine and titrated to 10 tid and also florinef 0.1 to help maintain her BP. She was unable to tolerate any b-blockers or ACEs due to hypotension. She finally was able to tolerate intermittent HD without any inotropic support and was moved to stepdown.   Her cath films were reviewed with both TCTS and interventional cardiology teams and it was felt that due to the diffuse nature of her CAD with severe distal disease she was not a candidate for percutaneous or surgical revascularization and she should be treated medically.   Her hospitalization was c/b by myoclonic jerking. She was seen by Neurology and initially treated with Keppra with improvement. The myoclonus resolved completely once her dobutamine was stopped and the Keppra was stopped.   As she improved, she was seen by PT and ambulated and was felt stable for d/c home. Outpatient HD has been arranged by the Renal team and she will start July 15 in [place].   She will need follow up in the HF Clinic. As well as with VVS for ongoing wound evaluation, treatment of her carotid stenosis and eventual permanent HD access (VVS wants to address carotid first). [facility] to follow for [facility] and [facility]. They will also provide wound care. INR to be faxed to Coumadin Clinic in [place] on Thursday.      Discharge Weight Range: 262 pounds on 7/13 after HD Discharge Vitals: Blood pressure 125/96, pulse 90, temperature 97.8 F (36.6 C), temperature source Oral, resp. rate 16, height 5' 7.5" (1.715 m), weight 262 lb  12.6 oz (119.2 kg), last menstrual period 08/19/2013, SpO2 95.00%.  Labs: Lab Results  Component Value Date   WBC 7.5 10/29/2013   HGB 8.6* 10/29/2013   HCT 28.3* 10/29/2013   MCV 90.1 10/29/2013   PLT 356 10/29/2013     Recent Labs Lab 10/29/13 0305  NA 133*  K 5.3  CL 91*  CO2 23  BUN 66*  CREATININE 7.57*  CALCIUM 8.8  GLUCOSE 173*   Lab Results  Component Value Date   CHOL 308* 11/22/2012   HDL 34* 11/22/2012   LDLCALC Comment:   Not calculated due to Triglyceride >400. Suggest ordering Direct LDL (Unit Code: [phone]).   Total Cholesterol/HDL Ratio:CHD Risk                        Coronary Heart Disease Risk Table  Men       Women          1/2 Average Risk              3.4        3.3              Average Risk              5.0        4.4           2X Average Risk              9.6        7.1           3X Average Risk             23.4       11.0 Use the calculated Patient Ratio above and the CHD Risk table  to determine the patient's CHD Risk. ATP III Classification (LDL):       < 100        mg/dL         Optimal      100 - 129     mg/dL         Near or Above Optimal      130 - 159     mg/dL         Borderline High      160 - 189     mg/dL         High       > 190        mg/dL         Very High   11/22/2012   TRIG 430* 11/22/2012   BNP (last 3 results)  Recent Labs  08/13/13 1949 09/06/13 0740  PROBNP 34187.0* 28622.0*    Diagnostic Studies/Procedures   No results found.  Discharge Medications     Medication List    STOP taking these medications       metolazone 5 MG tablet  Commonly known as:  ZAROXOLYN     metoprolol succinate 25 MG 24 hr tablet  Commonly known as:  TOPROL-XL     potassium chloride SA 20 MEQ tablet  Commonly known as:  K-DUR,KLOR-CON     pravastatin 20 MG tablet  Commonly known as:  PRAVACHOL     torsemide 20 MG tablet  Commonly known as:  DEMADEX      TAKE these medications       acetaminophen 500 MG  tablet  Commonly known as:  TYLENOL  Take 500 mg by mouth every 8 (eight) hours as needed for mild pain.     aspirin 81 MG EC tablet  Take 1 tablet (81 mg total) by mouth daily.     atorvastatin 80 MG tablet  Commonly known as:  LIPITOR  Take 1 tablet (80 mg total) by mouth daily at 6 PM.     calcitRIOL 0.25 MCG capsule  Commonly known as:  ROCALTROL  Take 1 capsule (0.25 mcg total) by mouth daily.     cholecalciferol 1000 UNITS tablet  Commonly known as:  VITAMIN D  Take 1,000 Units by mouth daily.     collagenase ointment  Commonly known as:  SANTYL  Apply topically daily.     cyclobenzaprine 10 MG tablet  Commonly known as:  FLEXERIL  Take 5-10 mg by mouth 3 (three) times daily as needed for muscle spasms.  Insulin Detemir 100 UNIT/ML Pen  Commonly known as:  LEVEMIR FLEXPEN  Inject 20 Units into the skin every evening.     insulin lispro 100 UNIT/ML KiwkPen  Commonly known as:  HUMALOG KWIKPEN  Inject 0.05 mLs (5 Units total) into the skin 3 (three) times daily with meals.     methylcellulose 1 % ophthalmic solution  Commonly known as:  ARTIFICIAL TEARS  Place 1 drop into both eyes at bedtime as needed (dry eyes).     midodrine 10 MG tablet  Commonly known as:  PROAMATINE  Take 1 tablet (10 mg total) by mouth 3 (three) times daily with meals.     oxyCODONE-acetaminophen 5-325 MG per tablet  Commonly known as:  PERCOCET/ROXICET  Take 1-2 tablets by mouth every 8 (eight) hours as needed for moderate pain or severe pain.     pantoprazole 40 MG tablet  Commonly known as:  PROTONIX  Take 1 tablet (40 mg total) by mouth daily.     polyethylene glycol packet  Commonly known as:  MIRALAX / GLYCOLAX  Take 17 g by mouth daily as needed (constipation).     warfarin 2 MG tablet  Commonly known as:  COUMADIN  Take 1 tablet (2 mg total) by mouth daily at 6 PM.        Disposition   The patient will be discharged in stable condition to home. Discharge  Instructions   Contraindication to ACEI at discharge    Complete by:  As directed      Diet - low sodium heart healthy    Complete by:  As directed      Heart Failure patients record your daily weight using the same scale at the same time of day    Complete by:  As directed      Increase activity slowly    Complete by:  As directed           Follow-up Information   Follow up with Trula Slade IV, [place], MD In 3 weeks. (Office will call you to arrange your appt(sent))    Specialty:  Vascular Surgery   Contact information:   [address] [place] [address] [phone]       Follow up with Loralie Champagne, MD On 11/05/2013. (at 1:20 Garage Code 4000)    Specialty:  Cardiology   Contact information:   [place]  [place] [address] [phone]       Follow up with [facility].   Contact information:   [address] [place] [address] [phone]         Duration of Discharge Encounter: Greater than 35 minutes   Signed, CLEGG,AMY NP-C 10/29/2013, 2:07 PM

## 2013-10-30 ENCOUNTER — Encounter: Payer: Medicare Other | Admitting: Cardiology

## 2013-10-31 ENCOUNTER — Ambulatory Visit (INDEPENDENT_AMBULATORY_CARE_PROVIDER_SITE_OTHER): Payer: Medicare Other | Admitting: *Deleted

## 2013-10-31 DIAGNOSIS — I82A11 Acute embolism and thrombosis of right axillary vein: Secondary | ICD-10-CM

## 2013-10-31 DIAGNOSIS — Z5181 Encounter for therapeutic drug level monitoring: Secondary | ICD-10-CM

## 2013-10-31 DIAGNOSIS — I82A19 Acute embolism and thrombosis of unspecified axillary vein: Secondary | ICD-10-CM

## 2013-10-31 LAB — POCT INR: INR: 3.6

## 2013-10-31 NOTE — Progress Notes (Signed)
This encounter was created in error - please disregard.

## 2013-11-02 ENCOUNTER — Ambulatory Visit (INDEPENDENT_AMBULATORY_CARE_PROVIDER_SITE_OTHER): Payer: Medicare Other | Admitting: *Deleted

## 2013-11-02 DIAGNOSIS — I82A19 Acute embolism and thrombosis of unspecified axillary vein: Secondary | ICD-10-CM

## 2013-11-02 DIAGNOSIS — Z5181 Encounter for therapeutic drug level monitoring: Secondary | ICD-10-CM

## 2013-11-02 DIAGNOSIS — I82A11 Acute embolism and thrombosis of right axillary vein: Secondary | ICD-10-CM

## 2013-11-02 LAB — POCT INR: INR: 7.8

## 2013-11-05 ENCOUNTER — Encounter (HOSPITAL_COMMUNITY): Payer: Self-pay

## 2013-11-05 ENCOUNTER — Ambulatory Visit (INDEPENDENT_AMBULATORY_CARE_PROVIDER_SITE_OTHER): Payer: Medicare Other | Admitting: *Deleted

## 2013-11-05 DIAGNOSIS — I82A19 Acute embolism and thrombosis of unspecified axillary vein: Secondary | ICD-10-CM

## 2013-11-05 DIAGNOSIS — Z5181 Encounter for therapeutic drug level monitoring: Secondary | ICD-10-CM

## 2013-11-05 DIAGNOSIS — I82A11 Acute embolism and thrombosis of right axillary vein: Secondary | ICD-10-CM

## 2013-11-05 LAB — PROTIME-INR: INR: 9.1 — AB (ref 0.9–1.1)

## 2013-11-05 NOTE — Progress Notes (Signed)
Patient ID: Hannah [name], female   DOB: 04-Mar-1971, 43 y.o.   MRN: [id number]  Weight Range   Baseline proBNP     HPI: Ms. Hannah [name] is a 43 y/o woman with type I diabetes complicated by nephropathy, retinopathy and severe vascular disease. In 2013 she had a cardiac cath which showed moderate CAD and a mildly decreased EF. In mid May 2015, she was in the midst of workup for AV fistula surgery. An outpatient stress test was ordered. This was done on May 18 and revealed anterior and apical ischemia with an ejection fraction of 22%. She was referred for admission due to the high-risk nature of her stress test and progressive HF symptoms with volume overload. On admission, her creatinine was 4.1 and the decision was made to proceed with cardiac catheterization despite the risk of contrast nephropathy given the high-risk nature of her stress test and the fact that she was felt to be nearing ESRD anyway.   Cardiac cath was done on 5/18 revealed 3v CAD with diabetic vasculopathy. LVEDP was 38. Echo showed an EF 30-35%. She was also found to have bilateral carotid stenosis with a 60-79% lesion on the right and a high-grade 80-99% lesion on the left. She was seen by Dr. Prescott [name] for possible CABG not felt to be vacularizable. Several days post-cath she decompensated with respiratory failure in the setting of progressive volume overload and cardiogenic shock. She was started on inotropes and moved to the ICU and a Trialysis catheter was placed for CVVHD. Almost 40 pounds of fluid was removed. Unfortunately she became progressively hypotensive and had increased pressor requirements. On 5/28 she had a co-ox of 38% and was taken to the cath lab for RHC and placement of Swan Ganz catheter which conformed cardiogenic shock and an IABP was palced. With the IABP in place she developed an ischemic right foot and the pump had to be removed urgently. VVS was consulted. She was taken to the OR on 6/1 for femoral  thromboembolectomy and 4-compartment fasciotomy of her RLE. She was started on broad spectrum abx. Both her groin and facsciotomy wounds were healing slowly so wound vacs were placed and these were followed by [facility] and wound care. She had several debridements. She continued to struggle with cardiogenic shock and profound hypotension and was maintained on very high dose pressors including milrinone and levophed at doses up to almost 176mcg/hr. Milrinone was eventually changed to dobutamine. On 6/3 she had VT/VF arrest and was intubated. Over the next several weeks she remained on CVVHD and we slowly and painstakingly weaned off her inotropes as her BP tolerated. (systolics often dipping into 70s). We started midodrine and titrated to 10 tid and also florinef 0.1 to help maintain her BP. She was unable to tolerate any b-blockers or ACEs due to hypotension. She finally was able to tolerate intermittent HD without any inotropic support and was moved to stepdown and later discharged home with [facility].    She returns for post hospital follow up. Complaining of RLE edema and erythema. Says the edema started 2 days ago.Increased erythema.  Able to walk a few steps. INR 9 yesterday. Denies SOB/PND. Started Outpatient dialysis last Wednesday. Tolerating HD M-W-F. [facility] following for wound care and PT/INR. Last INR 11/05/13 was 9. 1. She stopped coumadin  Last Thursday    ROS: All systems negative except as listed in HPI, PMH and Problem List.  Past Medical History  Diagnosis Date  . High cholesterol   .  Diabetic retinopathy   . Peripheral neuropathy     "tips of toes"  . Blind right eye   . CHF (congestive heart failure)   . CAD (coronary artery disease)   . GERD (gastroesophageal reflux disease)   . Hypertension   . History of lung cancer 07/2011    s/p left lower lobectomy  . Diabetes mellitus     IDDM  . Cataract of right eye   . Ulcer of toe of right foot 07/10/2012    great toe  . Breast calcification,  left 06/2012  . Acute biphenotypic leukemia   . CKD (chronic kidney disease) stage 5, GFR less than 15 ml/min   . Nephrotic syndrome   . Gastritis     H/o gastritis on prior endoscopy  . Anemia   . Carotid artery disease     Current Outpatient Prescriptions  Medication Sig Dispense Refill  . acetaminophen (TYLENOL) 500 MG tablet Take 500 mg by mouth every 8 (eight) hours as needed for mild pain.      Marland Kitchen aspirin EC 81 MG EC tablet Take 1 tablet (81 mg total) by mouth daily.  30 tablet  0  . atorvastatin (LIPITOR) 80 MG tablet Take 1 tablet (80 mg total) by mouth daily at 6 PM.  30 tablet  6  . calcitRIOL (ROCALTROL) 0.25 MCG capsule Take 1 capsule (0.25 mcg total) by mouth daily.  30 capsule  6  . cholecalciferol (VITAMIN D) 1000 UNITS tablet Take 1,000 Units by mouth daily.      . collagenase (SANTYL) ointment Apply topically daily.  15 g  6  . cyclobenzaprine (FLEXERIL) 10 MG tablet Take 5-10 mg by mouth 3 (three) times daily as needed for muscle spasms.      . Insulin Detemir (LEVEMIR FLEXPEN) 100 UNIT/ML Pen Inject 20 Units into the skin every evening.  15 mL  11  . insulin lispro (HUMALOG KWIKPEN) 100 UNIT/ML KiwkPen Inject 0.05 mLs (5 Units total) into the skin 3 (three) times daily with meals.  15 mL  0  . methylcellulose (ARTIFICIAL TEARS) 1 % ophthalmic solution Place 1 drop into both eyes at bedtime as needed (dry eyes).      . midodrine (PROAMATINE) 10 MG tablet Take 1 tablet (10 mg total) by mouth 3 (three) times daily with meals.  90 tablet  6  . ondansetron (ZOFRAN) 4 MG tablet Take 1 tablet (4 mg total) by mouth every 8 (eight) hours as needed for nausea or vomiting.  20 tablet  0  . oxyCODONE-acetaminophen (PERCOCET/ROXICET) 5-325 MG per tablet Take 1-2 tablets by mouth every 8 (eight) hours as needed for moderate pain or severe pain.  60 tablet  0  . pantoprazole (PROTONIX) 40 MG tablet Take 1 tablet (40 mg total) by mouth daily.  30 tablet  6  . polyethylene glycol (MIRALAX /  GLYCOLAX) packet Take 17 g by mouth daily as needed (constipation).  14 each  6  . warfarin (COUMADIN) 2 MG tablet Take 1 tablet (2 mg total) by mouth daily at 6 PM.  45 tablet  5   No current facility-administered medications for this encounter.     PHYSICAL EXAM: Filed Vitals:   11/06/13 0913  BP: 62/0  Pulse: 104  Resp: 18  Weight: 266 lb (120.657 kg)  SpO2: 95%    General:  Well appearing. No resp difficulty Sitting wheelchair HEENT: normal Neck: supple. JVP flat. Carotids 2+ bilaterally; no bruits. No lymphadenopathy or thryomegaly appreciated. Cor:  PMI normal. Regular rate & rhythm. No rubs, gallops or murmurs. L dialysis catheter Lungs: clear Abdomen: obese, soft, nontender, nondistended. No hepatosplenomegaly. No bruits or masses. Good bowel sounds. Extremities: no cyanosis, clubbing, rash, RLE 3+ edema with erythema. LLE trace edema. Unable to doppler  Neuro: alert & orientedx3, cranial nerves grossly intact. Moves all 4 extremities w/o difficulty. Affect pleasant.      ASSESSMENT & PLAN: 1. Chronic Systolic Heart Failure ECHO 10/28/2013 EF 25-30%  Volume  status per dialysis No BB or Ace due to hypotension. On midodrine 10 mg tid. BP low but not symptomatic.  2. S/P Cardiac Arrest VT/VF 3. 3V CAD non-revascularizable- no evidence of ischemia  4. RLE and groin wounds 5. CKD- Stage IV. On HD Mon-Wed Fri 6. RLE Cellulitis 7.R arm DVT - on coumadin - Last INR 11/05/13 9.1 She has not had not coumadin in the last 6 days.  8. Hypotension- SBP by doppler 62. She is not symptomatic  Send to ED now for evaluation of cellulitis. May require admit for antibiotics and vascular evaluation. Discussed with Dr Aundra Dubin and he agrees with ED evaluation. Contacted by   Keysean Savino NP-C  9:32 AM

## 2013-11-06 ENCOUNTER — Encounter (HOSPITAL_COMMUNITY): Payer: Self-pay

## 2013-11-06 ENCOUNTER — Ambulatory Visit (HOSPITAL_BASED_OUTPATIENT_CLINIC_OR_DEPARTMENT_OTHER)
Admission: RE | Admit: 2013-11-06 | Discharge: 2013-11-06 | Disposition: A | Discharge status: Home / Self Care | Payer: Medicare Other | Source: Ambulatory Visit | Attending: Cardiology | Admitting: Cardiology

## 2013-11-06 ENCOUNTER — Emergency Department (HOSPITAL_COMMUNITY): Admission: EM | Admit: 2013-11-06 | Payer: Medicare Other | Source: Home / Self Care

## 2013-11-06 ENCOUNTER — Inpatient Hospital Stay (HOSPITAL_COMMUNITY)
Admission: EM | Admit: 2013-11-06 | Discharge: 2013-11-10 | DRG: 602 | Disposition: A | Discharge status: Home Health | Payer: Medicare Other | Attending: Internal Medicine | Admitting: Internal Medicine

## 2013-11-06 ENCOUNTER — Encounter (HOSPITAL_COMMUNITY): Payer: Self-pay | Admitting: Emergency Medicine

## 2013-11-06 ENCOUNTER — Emergency Department (HOSPITAL_COMMUNITY): Admission: RE | Admit: 2013-11-06 | Payer: Medicare Other | Source: Ambulatory Visit

## 2013-11-06 VITALS — BP 62/0 | HR 104 | Resp 18 | Wt 266.0 lb

## 2013-11-06 DIAGNOSIS — I872 Venous insufficiency (chronic) (peripheral): Secondary | ICD-10-CM | POA: Diagnosis present

## 2013-11-06 DIAGNOSIS — Z7901 Long term (current) use of anticoagulants: Secondary | ICD-10-CM

## 2013-11-06 DIAGNOSIS — L02419 Cutaneous abscess of limb, unspecified: Secondary | ICD-10-CM | POA: Insufficient documentation

## 2013-11-06 DIAGNOSIS — E1139 Type 2 diabetes mellitus with other diabetic ophthalmic complication: Secondary | ICD-10-CM | POA: Diagnosis present

## 2013-11-06 DIAGNOSIS — I2510 Atherosclerotic heart disease of native coronary artery without angina pectoris: Secondary | ICD-10-CM | POA: Diagnosis present

## 2013-11-06 DIAGNOSIS — I5022 Chronic systolic (congestive) heart failure: Secondary | ICD-10-CM | POA: Insufficient documentation

## 2013-11-06 DIAGNOSIS — K3184 Gastroparesis: Secondary | ICD-10-CM | POA: Diagnosis present

## 2013-11-06 DIAGNOSIS — L03115 Cellulitis of right lower limb: Secondary | ICD-10-CM | POA: Diagnosis present

## 2013-11-06 DIAGNOSIS — E11319 Type 2 diabetes mellitus with unspecified diabetic retinopathy without macular edema: Secondary | ICD-10-CM | POA: Insufficient documentation

## 2013-11-06 DIAGNOSIS — Z6841 Body Mass Index (BMI) 40.0 and over, adult: Secondary | ICD-10-CM

## 2013-11-06 DIAGNOSIS — Z86718 Personal history of other venous thrombosis and embolism: Secondary | ICD-10-CM | POA: Diagnosis not present

## 2013-11-06 DIAGNOSIS — E1049 Type 1 diabetes mellitus with other diabetic neurological complication: Secondary | ICD-10-CM

## 2013-11-06 DIAGNOSIS — I959 Hypotension, unspecified: Secondary | ICD-10-CM

## 2013-11-06 DIAGNOSIS — E669 Obesity, unspecified: Secondary | ICD-10-CM | POA: Diagnosis present

## 2013-11-06 DIAGNOSIS — E7800 Pure hypercholesterolemia, unspecified: Secondary | ICD-10-CM | POA: Diagnosis present

## 2013-11-06 DIAGNOSIS — I82A11 Acute embolism and thrombosis of right axillary vein: Secondary | ICD-10-CM | POA: Diagnosis present

## 2013-11-06 DIAGNOSIS — Z794 Long term (current) use of insulin: Secondary | ICD-10-CM

## 2013-11-06 DIAGNOSIS — I509 Heart failure, unspecified: Secondary | ICD-10-CM | POA: Diagnosis present

## 2013-11-06 DIAGNOSIS — Z992 Dependence on renal dialysis: Secondary | ICD-10-CM

## 2013-11-06 DIAGNOSIS — N184 Chronic kidney disease, stage 4 (severe): Secondary | ICD-10-CM | POA: Insufficient documentation

## 2013-11-06 DIAGNOSIS — L03119 Cellulitis of unspecified part of limb: Secondary | ICD-10-CM

## 2013-11-06 DIAGNOSIS — Z85118 Personal history of other malignant neoplasm of bronchus and lung: Secondary | ICD-10-CM | POA: Diagnosis not present

## 2013-11-06 DIAGNOSIS — I82A19 Acute embolism and thrombosis of unspecified axillary vein: Secondary | ICD-10-CM

## 2013-11-06 DIAGNOSIS — I120 Hypertensive chronic kidney disease with stage 5 chronic kidney disease or end stage renal disease: Secondary | ICD-10-CM | POA: Diagnosis present

## 2013-11-06 DIAGNOSIS — I129 Hypertensive chronic kidney disease with stage 1 through stage 4 chronic kidney disease, or unspecified chronic kidney disease: Secondary | ICD-10-CM | POA: Insufficient documentation

## 2013-11-06 DIAGNOSIS — E1039 Type 1 diabetes mellitus with other diabetic ophthalmic complication: Secondary | ICD-10-CM

## 2013-11-06 DIAGNOSIS — I798 Other disorders of arteries, arterioles and capillaries in diseases classified elsewhere: Secondary | ICD-10-CM

## 2013-11-06 DIAGNOSIS — E1149 Type 2 diabetes mellitus with other diabetic neurological complication: Secondary | ICD-10-CM

## 2013-11-06 DIAGNOSIS — Z7982 Long term (current) use of aspirin: Secondary | ICD-10-CM | POA: Diagnosis not present

## 2013-11-06 DIAGNOSIS — M949 Disorder of cartilage, unspecified: Secondary | ICD-10-CM | POA: Diagnosis present

## 2013-11-06 DIAGNOSIS — E1059 Type 1 diabetes mellitus with other circulatory complications: Secondary | ICD-10-CM

## 2013-11-06 DIAGNOSIS — I10 Essential (primary) hypertension: Secondary | ICD-10-CM | POA: Diagnosis present

## 2013-11-06 DIAGNOSIS — I5042 Chronic combined systolic (congestive) and diastolic (congestive) heart failure: Secondary | ICD-10-CM | POA: Diagnosis present

## 2013-11-06 DIAGNOSIS — M7989 Other specified soft tissue disorders: Secondary | ICD-10-CM

## 2013-11-06 DIAGNOSIS — M899 Disorder of bone, unspecified: Secondary | ICD-10-CM | POA: Diagnosis present

## 2013-11-06 DIAGNOSIS — M79609 Pain in unspecified limb: Secondary | ICD-10-CM

## 2013-11-06 DIAGNOSIS — G609 Hereditary and idiopathic neuropathy, unspecified: Secondary | ICD-10-CM | POA: Diagnosis present

## 2013-11-06 DIAGNOSIS — Z87891 Personal history of nicotine dependence: Secondary | ICD-10-CM | POA: Diagnosis not present

## 2013-11-06 DIAGNOSIS — I82629 Acute embolism and thrombosis of deep veins of unspecified upper extremity: Secondary | ICD-10-CM

## 2013-11-06 DIAGNOSIS — N186 End stage renal disease: Secondary | ICD-10-CM | POA: Diagnosis present

## 2013-11-06 DIAGNOSIS — E1142 Type 2 diabetes mellitus with diabetic polyneuropathy: Secondary | ICD-10-CM | POA: Insufficient documentation

## 2013-11-06 DIAGNOSIS — N185 Chronic kidney disease, stage 5: Secondary | ICD-10-CM

## 2013-11-06 DIAGNOSIS — C3490 Malignant neoplasm of unspecified part of unspecified bronchus or lung: Secondary | ICD-10-CM | POA: Diagnosis present

## 2013-11-06 DIAGNOSIS — N2581 Secondary hyperparathyroidism of renal origin: Secondary | ICD-10-CM | POA: Diagnosis present

## 2013-11-06 DIAGNOSIS — D649 Anemia, unspecified: Secondary | ICD-10-CM | POA: Diagnosis present

## 2013-11-06 DIAGNOSIS — Z79899 Other long term (current) drug therapy: Secondary | ICD-10-CM | POA: Diagnosis not present

## 2013-11-06 DIAGNOSIS — H5440 Blindness, one eye, unspecified eye: Secondary | ICD-10-CM | POA: Diagnosis present

## 2013-11-06 DIAGNOSIS — K219 Gastro-esophageal reflux disease without esophagitis: Secondary | ICD-10-CM | POA: Diagnosis present

## 2013-11-06 DIAGNOSIS — E785 Hyperlipidemia, unspecified: Secondary | ICD-10-CM

## 2013-11-06 DIAGNOSIS — R791 Abnormal coagulation profile: Secondary | ICD-10-CM | POA: Diagnosis present

## 2013-11-06 DIAGNOSIS — L0390 Cellulitis, unspecified: Secondary | ICD-10-CM | POA: Insufficient documentation

## 2013-11-06 DIAGNOSIS — Z9889 Other specified postprocedural states: Secondary | ICD-10-CM

## 2013-11-06 LAB — CBC WITH DIFFERENTIAL/PLATELET
Basophils Absolute: 0 10*3/uL (ref 0.0–0.1)
Basophils Relative: 0 % (ref 0–1)
Eosinophils Absolute: 0.3 10*3/uL (ref 0.0–0.7)
Eosinophils Relative: 4 % (ref 0–5)
HCT: 27.7 % — ABNORMAL LOW (ref 36.0–46.0)
Hemoglobin: 8.2 g/dL — ABNORMAL LOW (ref 12.0–15.0)
LYMPHS ABS: 0.8 10*3/uL (ref 0.7–4.0)
Lymphocytes Relative: 14 % (ref 12–46)
MCH: 26 pg (ref 26.0–34.0)
MCHC: 29.6 g/dL — ABNORMAL LOW (ref 30.0–36.0)
MCV: 87.9 fL (ref 78.0–100.0)
Monocytes Absolute: 0.6 10*3/uL (ref 0.1–1.0)
Monocytes Relative: 11 % (ref 3–12)
NEUTROS ABS: 4.1 10*3/uL (ref 1.7–7.7)
NEUTROS PCT: 71 % (ref 43–77)
Platelets: 453 10*3/uL — ABNORMAL HIGH (ref 150–400)
RBC: 3.15 MIL/uL — AB (ref 3.87–5.11)
RDW: 17.6 % — ABNORMAL HIGH (ref 11.5–15.5)
WBC: 5.8 10*3/uL (ref 4.0–10.5)

## 2013-11-06 LAB — MRSA PCR SCREENING: MRSA by PCR: NEGATIVE

## 2013-11-06 LAB — COMPREHENSIVE METABOLIC PANEL
ALT: 20 U/L (ref 0–35)
AST: 23 U/L (ref 0–37)
Albumin: 2.6 g/dL — ABNORMAL LOW (ref 3.5–5.2)
Alkaline Phosphatase: 146 U/L — ABNORMAL HIGH (ref 39–117)
Anion gap: 22 — ABNORMAL HIGH (ref 5–15)
BUN: 34 mg/dL — AB (ref 6–23)
CHLORIDE: 90 meq/L — AB (ref 96–112)
CO2: 25 mEq/L (ref 19–32)
Calcium: 8.8 mg/dL (ref 8.4–10.5)
Creatinine, Ser: 5.04 mg/dL — ABNORMAL HIGH (ref 0.50–1.10)
GFR calc non Af Amer: 9 mL/min — ABNORMAL LOW (ref >90)
GFR, EST AFRICAN AMERICAN: 11 mL/min — AB (ref >90)
GLUCOSE: 144 mg/dL — AB (ref 70–99)
POTASSIUM: 4.4 meq/L (ref 3.7–5.3)
SODIUM: 137 meq/L (ref 137–147)
Total Bilirubin: 0.3 mg/dL (ref 0.3–1.2)
Total Protein: 6.2 g/dL (ref 6.0–8.3)

## 2013-11-06 LAB — I-STAT CG4 LACTIC ACID, ED: Lactic Acid, Venous: 1.55 mmol/L (ref 0.5–2.2)

## 2013-11-06 LAB — PROTIME-INR
INR: >10 (ref 0.00–1.49)
Prothrombin Time: >90 seconds — ABNORMAL HIGH (ref 11.6–15.2)

## 2013-11-06 LAB — GLUCOSE, CAPILLARY
Glucose-Capillary: 167 mg/dL — ABNORMAL HIGH (ref 70–99)
Glucose-Capillary: 274 mg/dL — ABNORMAL HIGH (ref 70–99)

## 2013-11-06 MED ORDER — MIDODRINE HCL 5 MG PO TABS
10.0000 mg | ORAL_TABLET | Freq: Three times a day (TID) | ORAL | Status: DC
  Filled 2013-11-06: qty 2

## 2013-11-06 MED ORDER — WARFARIN - PHARMACIST DOSING INPATIENT: Freq: Every day | Status: DC

## 2013-11-06 MED ORDER — INSULIN ASPART 100 UNIT/ML ~~LOC~~ SOLN
5.0000 [IU] | Freq: Three times a day (TID) | SUBCUTANEOUS | Status: DC
  Administered 2013-11-07 – 2013-11-10 (×8): 5 [IU] via SUBCUTANEOUS

## 2013-11-06 MED ORDER — VANCOMYCIN HCL 10 G IV SOLR
2000.0000 mg | INTRAVENOUS | Status: AC
  Administered 2013-11-06: 2000 mg via INTRAVENOUS
  Filled 2013-11-06: qty 2000

## 2013-11-06 MED ORDER — INSULIN DETEMIR 100 UNIT/ML FLEXPEN: 20.0000 [IU] | PEN_INJECTOR | Freq: Every evening | SUBCUTANEOUS | Status: DC

## 2013-11-06 MED ORDER — CYCLOBENZAPRINE HCL 10 MG PO TABS: 5.0000 mg | ORAL_TABLET | Freq: Three times a day (TID) | ORAL | Status: DC | PRN

## 2013-11-06 MED ORDER — OXYCODONE-ACETAMINOPHEN 5-325 MG PO TABS
1.0000 | ORAL_TABLET | Freq: Three times a day (TID) | ORAL | Status: DC | PRN
  Administered 2013-11-06: 1 via ORAL
  Administered 2013-11-07 – 2013-11-09 (×5): 2 via ORAL
  Filled 2013-11-06: qty 2
  Filled 2013-11-06: qty 1
  Filled 2013-11-06 (×3): qty 2

## 2013-11-06 MED ORDER — INSULIN LISPRO 100 UNIT/ML (KWIKPEN): 5.0000 [IU] | PEN_INJECTOR | Freq: Three times a day (TID) | SUBCUTANEOUS | Status: DC

## 2013-11-06 MED ORDER — PIPERACILLIN-TAZOBACTAM IN DEX 2-0.25 GM/50ML IV SOLN
2.2500 g | Freq: Three times a day (TID) | INTRAVENOUS | Status: DC
  Filled 2013-11-06 (×3): qty 50

## 2013-11-06 MED ORDER — INSULIN ASPART 100 UNIT/ML ~~LOC~~ SOLN
0.0000 [IU] | Freq: Three times a day (TID) | SUBCUTANEOUS | Status: DC
  Administered 2013-11-07 – 2013-11-08 (×3): 2 [IU] via SUBCUTANEOUS
  Administered 2013-11-09: 1 [IU] via SUBCUTANEOUS
  Administered 2013-11-09: 2 [IU] via SUBCUTANEOUS

## 2013-11-06 MED ORDER — CALCITRIOL 0.25 MCG PO CAPS
0.2500 ug | ORAL_CAPSULE | Freq: Every day | ORAL | Status: DC
  Administered 2013-11-06 – 2013-11-07 (×2): 0.25 ug via ORAL
  Filled 2013-11-06 (×2): qty 1

## 2013-11-06 MED ORDER — ATORVASTATIN CALCIUM 80 MG PO TABS
80.0000 mg | ORAL_TABLET | Freq: Every day | ORAL | Status: DC
  Administered 2013-11-07 – 2013-11-09 (×3): 80 mg via ORAL
  Filled 2013-11-06 (×4): qty 1

## 2013-11-06 MED ORDER — SODIUM CHLORIDE 0.9 % IJ SOLN
3.0000 mL | Freq: Two times a day (BID) | INTRAMUSCULAR | Status: DC
  Administered 2013-11-06 – 2013-11-09 (×5): 3 mL via INTRAVENOUS

## 2013-11-06 MED ORDER — ACETAMINOPHEN 500 MG PO TABS: 500.0000 mg | ORAL_TABLET | Freq: Three times a day (TID) | ORAL | Status: DC | PRN

## 2013-11-06 MED ORDER — PIPERACILLIN-TAZOBACTAM IN DEX 2-0.25 GM/50ML IV SOLN
2.2500 g | Freq: Three times a day (TID) | INTRAVENOUS | Status: DC
  Administered 2013-11-06 – 2013-11-10 (×11): 2.25 g via INTRAVENOUS
  Filled 2013-11-06 (×16): qty 50

## 2013-11-06 MED ORDER — METHYLCELLULOSE 1 % OP SOLN: 1.0000 [drp] | Freq: Every evening | OPHTHALMIC | Status: DC | PRN

## 2013-11-06 MED ORDER — SODIUM CHLORIDE 0.9 % IV SOLN: 250.0000 mL | INTRAVENOUS | Status: DC | PRN

## 2013-11-06 MED ORDER — MIDODRINE HCL 5 MG PO TABS
10.0000 mg | ORAL_TABLET | Freq: Three times a day (TID) | ORAL | Status: DC
  Administered 2013-11-06 – 2013-11-10 (×11): 10 mg via ORAL
  Filled 2013-11-06 (×14): qty 2

## 2013-11-06 MED ORDER — OXYCODONE-ACETAMINOPHEN 5-325 MG PO TABS
1.0000 | ORAL_TABLET | Freq: Once | ORAL | Status: AC
  Administered 2013-11-06: 1 via ORAL
  Filled 2013-11-06: qty 1

## 2013-11-06 MED ORDER — COLLAGENASE 250 UNIT/GM EX OINT
TOPICAL_OINTMENT | Freq: Every day | CUTANEOUS | Status: DC
  Administered 2013-11-09 – 2013-11-10 (×2): via TOPICAL
  Filled 2013-11-06 (×2): qty 30

## 2013-11-06 MED ORDER — SODIUM CHLORIDE 0.9 % IJ SOLN: 3.0000 mL | INTRAMUSCULAR | Status: DC | PRN

## 2013-11-06 MED ORDER — POLYVINYL ALCOHOL 1.4 % OP SOLN
1.0000 [drp] | Freq: Every evening | OPHTHALMIC | Status: DC | PRN
  Filled 2013-11-06: qty 15

## 2013-11-06 MED ORDER — VANCOMYCIN HCL IN DEXTROSE 1-5 GM/200ML-% IV SOLN
1000.0000 mg | INTRAVENOUS | Status: DC
  Administered 2013-11-07 – 2013-11-09 (×2): 1000 mg via INTRAVENOUS
  Filled 2013-11-06 (×3): qty 200

## 2013-11-06 MED ORDER — ASPIRIN EC 81 MG PO TBEC
81.0000 mg | DELAYED_RELEASE_TABLET | Freq: Every day | ORAL | Status: DC
  Administered 2013-11-06 – 2013-11-10 (×5): 81 mg via ORAL
  Filled 2013-11-06 (×5): qty 1

## 2013-11-06 MED ORDER — PANTOPRAZOLE SODIUM 40 MG PO TBEC
40.0000 mg | DELAYED_RELEASE_TABLET | Freq: Every day | ORAL | Status: DC
  Administered 2013-11-07 – 2013-11-10 (×4): 40 mg via ORAL
  Filled 2013-11-06 (×2): qty 1

## 2013-11-06 MED ORDER — VITAMIN D3 25 MCG (1000 UNIT) PO TABS
1000.0000 [IU] | ORAL_TABLET | Freq: Every day | ORAL | Status: DC
  Administered 2013-11-06 – 2013-11-08 (×3): 1000 [IU] via ORAL
  Filled 2013-11-06 (×4): qty 1

## 2013-11-06 MED ORDER — POLYETHYLENE GLYCOL 3350 17 G PO PACK
17.0000 g | PACK | Freq: Every day | ORAL | Status: DC | PRN
  Filled 2013-11-06: qty 1

## 2013-11-06 MED ORDER — PHYTONADIONE 5 MG PO TABS
2.5000 mg | ORAL_TABLET | Freq: Once | ORAL | Status: AC
  Administered 2013-11-06: 2.5 mg via ORAL
  Filled 2013-11-06: qty 1

## 2013-11-06 MED ORDER — ONDANSETRON HCL 4 MG PO TABS: 4.0000 mg | ORAL_TABLET | Freq: Three times a day (TID) | ORAL | Status: DC | PRN

## 2013-11-06 MED ORDER — INSULIN DETEMIR 100 UNIT/ML ~~LOC~~ SOLN
20.0000 [IU] | Freq: Every evening | SUBCUTANEOUS | Status: DC
  Administered 2013-11-06: 20 [IU] via SUBCUTANEOUS
  Filled 2013-11-06 (×3): qty 0.2

## 2013-11-06 NOTE — Progress Notes (Signed)
Pt just came to floor from ED. Pt is alert and oriented x 4.

## 2013-11-06 NOTE — Progress Notes (Signed)
*  PRELIMINARY RESULTS* Vascular Ultrasound Right lower extremity venous duplex has been completed.  Preliminary findings: No evidence of DVT or baker's cyst. Venous flow is pulsatile, suggesting increased right heart pressure.  Landry Mellow, RDMS, RVT  11/06/2013, 12:08 PM

## 2013-11-06 NOTE — H&P (Signed)
[facility] History and Physical  Hannah [name] QAS:[id number] DOB: Apr 13, 1971 DOA: 11/06/2013  Referring physician:  PCP: Vic Blackbird, MD  Specialists:   Chief Complaint: Right leg swelling and redness.  HPI: Hannah [name] is a 43 y.o. female  With an extensive past medical history including congestive heart failure, diabetes mellitus, end-stage renal disease, hypertension that presented to the emergency department with complaints of right lower leg pain and, swelling, and erythema. Patient was recently discharged from the hospital approximately 8 days ago after multiple complications: The heart catheterization requiring repeat heart catheterization for placement of Swan-Ganz catheter, cardiogenic shock, IABP placement. Patient developed ischemic foot which required vascular surgery to see her and she was taken to the OR 09/17/2013 for a femoral thromboembolectomy with 4 compartment fasciotomy of the right lower extremity. Patient's wound did appear to be healing slowly. Patient noticed on Sunday that she started having warm and tenderness of her right lower extremity. Patient noted some redness as well as swelling to the right lower extremity. Patient also stated that last week she was told her INR was high and that she was to hold Coumadin on Thursday, July 16th.  Despite not taking her Coumadin, her INR continues to trend upward. At this time patient denies any deep pain she does state that there is pain to the right lower extremity when touched. She does complain of some warmth. Patient denies any recent insect bites, trauma, recent travel. She denies any chest pain, shortness of breath, abdominal pain.  Review of Systems:  Constitutional: Denies fever, chills, diaphoresis, appetite change and fatigue.  HEENT: Denies photophobia, eye pain, redness, hearing loss, ear pain, congestion, sore throat, rhinorrhea, sneezing, mouth sores, trouble swallowing, neck pain, neck stiffness and  tinnitus.   Respiratory: Denies SOB, DOE, cough, chest tightness,  and wheezing.   Cardiovascular: Denies chest pain, palpitations and leg swelling.  Gastrointestinal: Denies nausea, vomiting, abdominal pain, diarrhea, constipation, blood in stool and abdominal distention.  Genitourinary: Denies dysuria, urgency, frequency, hematuria, flank pain and difficulty urinating.  Musculoskeletal: Denies myalgias, back pain, joint swelling, arthralgias and gait problem.  Skin: There is warmth of the right lower extremity with redness. Neurological: Denies dizziness, seizures, syncope, weakness, light-headedness, numbness and headaches.  Hematological: Denies adenopathy. Easy bruising, personal or family bleeding history  Psychiatric/Behavioral: Denies suicidal ideation, mood changes, confusion, nervousness, sleep disturbance and agitation  Past Medical History  Diagnosis Date  . High cholesterol   . Diabetic retinopathy   . Peripheral neuropathy     "tips of toes"  . Blind right eye   . CHF (congestive heart failure)   . CAD (coronary artery disease)   . GERD (gastroesophageal reflux disease)   . Hypertension   . History of lung cancer 07/2011    s/p left lower lobectomy  . Diabetes mellitus     IDDM  . Cataract of right eye   . Ulcer of toe of right foot 07/10/2012    great toe  . Breast calcification, left 06/2012  . Acute biphenotypic leukemia   . CKD (chronic kidney disease) stage 5, GFR less than 15 ml/min   . Nephrotic syndrome   . Gastritis     H/o gastritis on prior endoscopy  . Anemia   . Carotid artery disease    Past Surgical History  Procedure Laterality Date  . Cardiac catheterization  07/16/2011  . Incision and drainage breast abscess Left   . Tubal ligation  1994  . Vitrectomy  2010  2 on left, 1 on right  . Cesarean section  1991; 1994  . Video assisted thoracoscopy (vats)/ lobectomy Left 07/30/2011    left main thoracotomy, left lower lobectomy, mediastinal lymph  node dissection  . Lobectomy    . Colonoscopy with esophagogastroduodenoscopy (egd) N/A 08/14/2012    Procedure: COLONOSCOPY WITH ESOPHAGOGASTRODUODENOSCOPY (EGD);  Surgeon: Danie Binder, MD;  [facility];  Service: Endoscopy;  Laterality: N/A;  10:45-moved to [address] to notify pt  . Breast lumpectomy with needle localization Left 11/14/2012    Procedure: BREAST LUMPECTOMY WITH NEEDLE LOCALIZATION;  Surgeon: Marcello Moores A. Cornett, MD;  Location: [place];  Service: General;  Laterality: Left;  . Insertion of dialysis catheter Left 09/12/2013    Procedure: INSERTION OF DIALYSIS CATHETER;  Surgeon: Mal Misty, MD;  Location: [place];  Service: Vascular;  Laterality: Left;  . Embolectomy Right 09/17/2013    Procedure: Thrombectomy of Right Common Femoral Artery;  Surgeon: Elam Dutch, MD;  Location: [facility] OR;  Service: Vascular;  Laterality: Right;  . Endarterectomy femoral Right 09/17/2013    Procedure: Right Femoral Endarterectomy;  Surgeon: Elam Dutch, MD;  Location: [facility] OR;  Service: Vascular;  Laterality: Right;  . Patch angioplasty Right 09/17/2013    Procedure: Vein Patch Angioplasty of Right Femoral Artery;  Surgeon: Elam Dutch, MD;  Location: [place];  Service: Vascular;  Laterality: Right;  . Fasciotomy Right 09/17/2013    Procedure: Four Compartment Fasciotomy;  Surgeon: Elam Dutch, MD;  Location: [place];  Service: Vascular;  Laterality: Right;  . Fasciotomy closure Right 09/19/2013    Procedure: FASCIOTOMY CLOSURE;  Surgeon: Elam Dutch, MD;  Location: [place];  Service: Vascular;  Laterality: Right;  regional block and monitored anesthesia care used  . I&d extremity Right 09/28/2013    Procedure: IRRIGATION AND DEBRIDEMENT EXTREMITY;  Surgeon: Serafina Mitchell, MD;  Location: [facility] OR;  Service: Vascular;  Laterality: Right;  . Application of wound vac Right 09/28/2013    Procedure: APPLICATION OF WOUND VAC;  Surgeon: Serafina Mitchell, MD;  Location: [place] OR;  Service:  Vascular;  Laterality: Right;   Social History:  reports that she quit smoking about 2 years ago. She has never used smokeless tobacco. She reports that she does not drink alcohol or use illicit drugs.   Allergies  Allergen Reactions  . Crestor [Rosuvastatin] Other (See Comments)    Severe muscle weakness  . Nsaids Other (See Comments)    Not allergic, "bad on my kidneys"  . Ciprofloxacin Rash    Family History  Problem Relation Age of Onset  . Coronary artery disease Father   . Asthma Father   . COPD Father   . Hypertension Father   . Hyperlipidemia Father   . Diabetes Father   . Congestive Heart Failure Father   . Heart disease Father     before age 85  . Heart attack Father   . Peripheral vascular disease Father   . Hypertension Mother   . Hyperlipidemia Mother   . Diabetes Mother   . Cancer Mother   . Cancer Maternal Aunt      three aunts, bone, breast, ?  . Hypertension Brother   . Diabetes Brother   . Diabetes Sister   . Colon cancer Neg Hx   . Celiac disease Neg Hx   . Crohn's disease Neg Hx   . Ulcerative colitis Neg Hx   . Lung cancer Maternal Grandmother  Prior to Admission medications   Medication Sig Start Date End Date Taking? Authorizing Provider  acetaminophen (TYLENOL) 500 MG tablet Take 500 mg by mouth every 8 (eight) hours as needed for mild pain.   Yes Historical Provider, MD  aspirin EC 81 MG EC tablet Take 1 tablet (81 mg total) by mouth daily. 08/17/13  Yes Belkys A Regalado, MD  atorvastatin (LIPITOR) 80 MG tablet Take 1 tablet (80 mg total) by mouth daily at 6 PM. 10/29/13  Yes Amy D Clegg, NP  calcitRIOL (ROCALTROL) 0.25 MCG capsule Take 1 capsule (0.25 mcg total) by mouth daily. 10/29/13  Yes Amy D Clegg, NP  cholecalciferol (VITAMIN D) 1000 UNITS tablet Take 1,000 Units by mouth daily.   Yes Historical Provider, MD  collagenase (SANTYL) ointment Apply topically daily. 10/29/13  Yes Amy D Clegg, NP  cyclobenzaprine (FLEXERIL) 10 MG tablet  Take 5-10 mg by mouth 3 (three) times daily as needed for muscle spasms. 07/06/13  Yes Alycia Rossetti, MD  Insulin Detemir (LEVEMIR FLEXPEN) 100 UNIT/ML Pen Inject 20 Units into the skin every evening. 10/29/13  Yes Amy D Clegg, NP  insulin lispro (HUMALOG KWIKPEN) 100 UNIT/ML KiwkPen Inject 0.05 mLs (5 Units total) into the skin 3 (three) times daily with meals. 10/29/13  Yes Amy D Clegg, NP  methylcellulose (ARTIFICIAL TEARS) 1 % ophthalmic solution Place 1 drop into both eyes at bedtime as needed (dry eyes).   Yes Historical Provider, MD  midodrine (PROAMATINE) 10 MG tablet Take 1 tablet (10 mg total) by mouth 3 (three) times daily with meals. 10/29/13  Yes Amy D Clegg, NP  ondansetron (ZOFRAN) 4 MG tablet Take 1 tablet (4 mg total) by mouth every 8 (eight) hours as needed for nausea or vomiting. 10/29/13  Yes Amy D Clegg, NP  oxyCODONE-acetaminophen (PERCOCET/ROXICET) 5-325 MG per tablet Take 1-2 tablets by mouth every 8 (eight) hours as needed for moderate pain or severe pain. 10/29/13  Yes Amy D Clegg, NP  pantoprazole (PROTONIX) 40 MG tablet Take 1 tablet (40 mg total) by mouth daily. 10/29/13  Yes Amy D Clegg, NP  polyethylene glycol (MIRALAX / GLYCOLAX) packet Take 17 g by mouth daily as needed (constipation). 10/29/13  Yes Amy D Clegg, NP  warfarin (COUMADIN) 2 MG tablet Take 1 tablet (2 mg total) by mouth daily at 6 PM. 10/29/13  Yes Amy D Clegg, NP   Physical Exam: Filed Vitals:   11/06/13 1247  BP: 92/52  Pulse: 88  Temp: 97.6 F (36.4 C)  Resp: 20     General: Well developed, well nourished, NAD, appears stated age  HEENT: NCAT, PERRLA, EOMI, Anicteic Sclera, mucous membranes moist.   Neck: Supple, no JVD, no masses  Cardiovascular: S1 S2 auscultated, no rubs, murmurs or gallops. Regular rate and rhythm.  Respiratory: Clear to auscultation bilaterally with equal chest rise  Abdomen: Soft, obese, nontender, nondistended, + bowel sounds  Extremities: warm dry without cyanosis  clubbing. +LE edema B/L.  RLE erythema, fasciotomy sites, but calyxes now the distal portion of the third and fourth toes.  Neuro: AAOx3, cranial nerves grossly intact. Strength 5/5 in patient's upper and lower extremities bilaterally  Skin: Cellulitis, erythema and edema of the right lower extremity, 2 fasciotomy sites on the right lower chimney, clean with serous sanguinous drainage.  Psych: Normal affect and demeanor with intact judgement and insight  Labs on Admission:  Basic Metabolic Panel:  Recent Labs Lab 11/06/13 1035  NA 137  K 4.4  CL  90*  CO2 25  GLUCOSE 144*  BUN 34*  CREATININE 5.04*  CALCIUM 8.8   Liver Function Tests:  Recent Labs Lab 11/06/13 1035  AST 23  ALT 20  ALKPHOS 146*  BILITOT 0.3  PROT 6.2  ALBUMIN 2.6*   No results found for this basename: LIPASE, AMYLASE,  in the last 168 hours No results found for this basename: AMMONIA,  in the last 168 hours CBC:  Recent Labs Lab 11/06/13 1035  WBC 5.8  NEUTROABS 4.1  HGB 8.2*  HCT 27.7*  MCV 87.9  PLT 453*   Cardiac Enzymes: No results found for this basename: CKTOTAL, CKMB, CKMBINDEX, TROPONINI,  in the last 168 hours  BNP (last 3 results)  Recent Labs  08/13/13 1949 09/06/13 0740  PROBNP 34187.0* 28622.0*   CBG: No results found for this basename: GLUCAP,  in the last 168 hours  Radiological Exams on Admission: No results found.  EKG: None  Assessment/Plan  Right lower extremity cellulitis -Patient admitted to step down unit -Will place patient on vancomycin and Zosyn with pharmacy to do. -Last wound care to evaluate patient as she is status post fasciotomy and debridement back in June 2015 -Vascular surgery consulted by the emergency department. -Right Lower extremity Dopplers negative for DVT -Patient currently afebrile no leukocytosis. -Blood cultures currently pending.  Supratherapeutic INR -Patient has been on Coumadin, however, had elevated INR last week and was  told to stop Coumadin on Thursday, 11/01/2013. -Her INR continues to rise. -Given her history, will reverse patient conservatively with vitamin K 2.5 mg by mouth. -Will continue to monitor her INR. -At this time patient does not have any evidence of bleeding.  End-stage renal disease requiring hemodialysis -Patient dialyzes on Monday, Wednesday, Friday -Will consult nephrology.  Hypotension -Continue midodrine.  Chronic combined systolic/diastolic congestive heart failure -Will monitor patient's daily weights as well as intake. -Currently compensated, continue dialysis. -Echocardiogram on 10/28/2013 shows an EF of 25-30% with severe global hypokinesis, grade 3 diastolic dysfunction.  Diabetes mellitus type 2 -Continue home regimen along with insulin sliding scale with CBG monitoring.  Normocytic anemia -Hemoglobin baseline is approximately 8., currently 8.2. -Will continue to monitor CBC.   DVT prophylaxis: Supratherapeutic INR, warfarin  Code Status: Full  Condition: Guarded   Family Communication: Family at bedside. Admission, patients condition and plan of care including tests being ordered have been discussed with the patient and family who indicate understanding and agree with the plan and Code Status.  Disposition Plan: Admitted.   Time spent: 60 minutes  Deyna Carbon D.O. [facility] Pager [phone]  If 7PM-7AM, please contact night-coverage [email or web address] Password [facility] 11/06/2013, 3:33 PM

## 2013-11-06 NOTE — ED Notes (Signed)
Report attempted 

## 2013-11-06 NOTE — ED Provider Notes (Signed)
CSN: [id number]     Arrival date & time 11/06/13  0938 History   First MD Initiated Contact with Patient 11/06/13 1019     Chief Complaint  Patient presents with  . Leg Pain     (Consider location/radiation/quality/duration/timing/severity/associated sxs/prior Treatment) The history is provided by the patient and medical records.   This is a 43 y.o. F with extensive PMH including HTN, DM, CHF, CKD on hemodialysis, CAD, presenting to the ED for right lower leg pain, swelling, and erythema.  Pt was discharged from the hospital 8 days ago after mulitple complications following a heart catherization.  She was taken back to cath lab for [place] and placement of Swan Ganz catheter which conformed cardiogenic shock and an IABP was palced. With the IABP in place she developed an ischemic right foot and the pump had to be removed urgently. VVS was consulted. She was taken to the OR on 6/1 for femoral thromboembolectomy and 4-compartment fasciotomy of her RLE. Wound have been healing slowly, initially they were closed but opened again after she sustained a fall.  Physician elected for wounds to close naturally.   States after discharge from the hospital she was initially doing well but developed some redness and swelling on her right lower leg for the past few days.  She denies fever or chills.  Has been getting her normal wound care, no noted signs of infection.  States she was told to hold her coumadin starting yesterday due to INR being supra-therapeutic, was told it was 9.  She has had no active bleeding.  No melena or hematochezia.  Dialysis schedule is M, W, F-- completed full treatment yesterday without complications.  Past Medical History  Diagnosis Date  . High cholesterol   . Diabetic retinopathy   . Peripheral neuropathy     "tips of toes"  . Blind right eye   . CHF (congestive heart failure)   . CAD (coronary artery disease)   . GERD (gastroesophageal reflux disease)   . Hypertension   .  History of lung cancer 07/2011    s/p left lower lobectomy  . Diabetes mellitus     IDDM  . Cataract of right eye   . Ulcer of toe of right foot 07/10/2012    great toe  . Breast calcification, left 06/2012  . Acute biphenotypic leukemia   . CKD (chronic kidney disease) stage 5, GFR less than 15 ml/min   . Nephrotic syndrome   . Gastritis     H/o gastritis on prior endoscopy  . Anemia   . Carotid artery disease    Past Surgical History  Procedure Laterality Date  . Cardiac catheterization  07/16/2011  . Incision and drainage breast abscess Left   . Tubal ligation  1994  . Vitrectomy  2010    2 on left, 1 on right  . Cesarean section  1991; 1994  . Video assisted thoracoscopy (vats)/ lobectomy Left 07/30/2011    left main thoracotomy, left lower lobectomy, mediastinal lymph node dissection  . Lobectomy    . Colonoscopy with esophagogastroduodenoscopy (egd) N/A 08/14/2012    Procedure: COLONOSCOPY WITH ESOPHAGOGASTRODUODENOSCOPY (EGD);  Surgeon: Danie Binder, MD;  [facility];  Service: Endoscopy;  Laterality: N/A;  10:45-moved to [address] to notify pt  . Breast lumpectomy with needle localization Left 11/14/2012    Procedure: BREAST LUMPECTOMY WITH NEEDLE LOCALIZATION;  Surgeon: Marcello Moores A. Cornett, MD;  Location: [place];  Service: General;  Laterality: Left;  .  Insertion of dialysis catheter Left 09/12/2013    Procedure: INSERTION OF DIALYSIS CATHETER;  Surgeon: Mal Misty, MD;  Location: [place];  Service: Vascular;  Laterality: Left;  . Embolectomy Right 09/17/2013    Procedure: Thrombectomy of Right Common Femoral Artery;  Surgeon: Elam Dutch, MD;  Location: [facility] OR;  Service: Vascular;  Laterality: Right;  . Endarterectomy femoral Right 09/17/2013    Procedure: Right Femoral Endarterectomy;  Surgeon: Elam Dutch, MD;  Location: [facility] OR;  Service: Vascular;  Laterality: Right;  . Patch angioplasty Right 09/17/2013    Procedure: Vein Patch Angioplasty of Right  Femoral Artery;  Surgeon: Elam Dutch, MD;  Location: [place];  Service: Vascular;  Laterality: Right;  . Fasciotomy Right 09/17/2013    Procedure: Four Compartment Fasciotomy;  Surgeon: Elam Dutch, MD;  Location: [place];  Service: Vascular;  Laterality: Right;  . Fasciotomy closure Right 09/19/2013    Procedure: FASCIOTOMY CLOSURE;  Surgeon: Elam Dutch, MD;  Location: [place];  Service: Vascular;  Laterality: Right;  regional block and monitored anesthesia care used  . I&d extremity Right 09/28/2013    Procedure: IRRIGATION AND DEBRIDEMENT EXTREMITY;  Surgeon: Serafina Mitchell, MD;  Location[facility];  Service: Vascular;  Laterality: Right;  . Application of wound vac Right 09/28/2013    Procedure: APPLICATION OF WOUND VAC;  Surgeon: Serafina Mitchell, MD;  Location: [facility] OR;  Service: Vascular;  Laterality: Right;   Family History  Problem Relation Age of Onset  . Coronary artery disease Father   . Asthma Father   . COPD Father   . Hypertension Father   . Hyperlipidemia Father   . Diabetes Father   . Congestive Heart Failure Father   . Heart disease Father     before age 87  . Heart attack Father   . Peripheral vascular disease Father   . Hypertension Mother   . Hyperlipidemia Mother   . Diabetes Mother   . Cancer Mother   . Cancer Maternal Aunt      three aunts, bone, breast, ?  . Hypertension Brother   . Diabetes Brother   . Diabetes Sister   . Colon cancer Neg Hx   . Celiac disease Neg Hx   . Crohn's disease Neg Hx   . Ulcerative colitis Neg Hx   . Lung cancer Maternal Grandmother    History  Substance Use Topics  . Smoking status: Former Smoker -- 2.00 packs/day for 30 years    Quit date: 08/01/2011  . Smokeless tobacco: Never Used  . Alcohol Use: No   OB History   Grav Para Term Preterm Abortions TAB SAB Ect Mult Living                 Review of Systems  Skin: Positive for color change.  All other systems reviewed and are negative.     Allergies   Crestor; Nsaids; and Ciprofloxacin  Home Medications   Prior to Admission medications   Medication Sig Start Date End Date Taking? Authorizing Provider  acetaminophen (TYLENOL) 500 MG tablet Take 500 mg by mouth every 8 (eight) hours as needed for mild pain.   Yes Historical Provider, MD  aspirin EC 81 MG EC tablet Take 1 tablet (81 mg total) by mouth daily. 08/17/13  Yes Belkys A Regalado, MD  atorvastatin (LIPITOR) 80 MG tablet Take 1 tablet (80 mg total) by mouth daily at 6 PM. 10/29/13  Yes Amy Estrella Deeds, NP  calcitRIOL (ROCALTROL) 0.25 MCG capsule Take 1 capsule (0.25 mcg total) by mouth daily. 10/29/13  Yes Amy D Clegg, NP  cholecalciferol (VITAMIN D) 1000 UNITS tablet Take 1,000 Units by mouth daily.   Yes Historical Provider, MD  collagenase (SANTYL) ointment Apply topically daily. 10/29/13  Yes Amy D Clegg, NP  cyclobenzaprine (FLEXERIL) 10 MG tablet Take 5-10 mg by mouth 3 (three) times daily as needed for muscle spasms. 07/06/13  Yes Alycia Rossetti, MD  Insulin Detemir (LEVEMIR FLEXPEN) 100 UNIT/ML Pen Inject 20 Units into the skin every evening. 10/29/13  Yes Amy D Clegg, NP  insulin lispro (HUMALOG KWIKPEN) 100 UNIT/ML KiwkPen Inject 0.05 mLs (5 Units total) into the skin 3 (three) times daily with meals. 10/29/13  Yes Amy D Clegg, NP  methylcellulose (ARTIFICIAL TEARS) 1 % ophthalmic solution Place 1 drop into both eyes at bedtime as needed (dry eyes).   Yes Historical Provider, MD  midodrine (PROAMATINE) 10 MG tablet Take 1 tablet (10 mg total) by mouth 3 (three) times daily with meals. 10/29/13  Yes Amy D Clegg, NP  ondansetron (ZOFRAN) 4 MG tablet Take 1 tablet (4 mg total) by mouth every 8 (eight) hours as needed for nausea or vomiting. 10/29/13  Yes Amy D Clegg, NP  oxyCODONE-acetaminophen (PERCOCET/ROXICET) 5-325 MG per tablet Take 1-2 tablets by mouth every 8 (eight) hours as needed for moderate pain or severe pain. 10/29/13  Yes Amy D Clegg, NP  pantoprazole (PROTONIX) 40 MG tablet  Take 1 tablet (40 mg total) by mouth daily. 10/29/13  Yes Amy D Clegg, NP  polyethylene glycol (MIRALAX / GLYCOLAX) packet Take 17 g by mouth daily as needed (constipation). 10/29/13  Yes Amy D Clegg, NP  warfarin (COUMADIN) 2 MG tablet Take 1 tablet (2 mg total) by mouth daily at 6 PM. 10/29/13  Yes Amy D Clegg, NP   BP 90/58  Pulse 98  Temp(Src) 97.8 F (36.6 C) (Oral)  Resp 18  SpO2 99%  Physical Exam  Nursing note and vitals reviewed. Constitutional: She is oriented to person, place, and time. She appears well-developed and well-nourished. No distress.  obese  HENT:  Head: Normocephalic and atraumatic.  Mouth/Throat: Oropharynx is clear and moist.  Eyes: Conjunctivae and EOM are normal. Pupils are equal, round, and reactive to light.  Neck: Normal range of motion. Neck supple.  Cardiovascular: Normal rate, regular rhythm and normal heart sounds.   Pulmonary/Chest: Effort normal and breath sounds normal. No respiratory distress. She has no wheezes.  Abdominal: Soft. Bowel sounds are normal.  Musculoskeletal: Normal range of motion. She exhibits edema.       Left lower leg: She exhibits swelling.  Right lower extremity with swelling, erythema, and warmth to touch from distal knee to dorsal foot; 2 fasciotomy sites of right lower leg appear clean, serosanguinous drainage noted; no surrounding induration or signs of infection; granulation tissue present Right foot with black calluses present on distal 3rd and 4th toes (not new) some pitting edema noted bilaterally, DP pulses intact bilaterally; normal sensation throughout bilateral feet and legs; compartments remain soft  Neurological: She is alert and oriented to person, place, and time.  Skin: Skin is warm and dry. She is not diaphoretic.  Psychiatric: She has a normal mood and affect.    ED Course  Procedures (including critical care time) Labs Review Labs Reviewed  CBC WITH DIFFERENTIAL - Abnormal; Notable for the following:     RBC 3.15 (*)    Hemoglobin 8.2 (*)  HCT 27.7 (*)    MCHC 29.6 (*)    RDW 17.6 (*)    Platelets 453 (*)    All other components within normal limits  COMPREHENSIVE METABOLIC PANEL - Abnormal; Notable for the following:    Chloride 90 (*)    Glucose, Bld 144 (*)    BUN 34 (*)    Creatinine, Ser 5.04 (*)    Albumin 2.6 (*)    Alkaline Phosphatase 146 (*)    GFR calc non Af Amer 9 (*)    GFR calc Af Amer 11 (*)    Anion gap 22 (*)    All other components within normal limits  PROTIME-INR - Abnormal; Notable for the following:    Prothrombin Time >90.0 (*)    INR >10.00 (*)    All other components within normal limits  CULTURE, BLOOD (ROUTINE X 2)  CULTURE, BLOOD (ROUTINE X 2)  I-STAT CG4 LACTIC ACID, ED    Imaging Review No results found.  *PRELIMINARY RESULTS*  Vascular Ultrasound  Right lower extremity venous duplex has been completed. Preliminary findings: No evidence of DVT or baker's cyst. Venous flow is pulsatile, suggesting increased right heart pressure.  Landry Mellow, RDMS, RVT  11/06/2013, 12:08 PM   EKG Interpretation None      MDM   Final diagnoses:  Supratherapeutic INR  History of fasciotomy  ESRD on hemodialysis  Cellulitis of right leg   43 year old female with a complex past medical history and recent 2 month admission to the hospital following multiple complications after heart catheterization presenting with right lower extremity swelling and erythema. Supratherapeutic INR was noted at dialysis yesterday, Coumadin has been held since this time.  On exam she has 2 open right lower extremity fasciotomy sites which are clean without signs of superimposed infection. Her legs are neurovascularly intact with palpable DP pulses bilaterally.  She has what appears to be a cellulitis extending from distal right knee to dorsal surface of right foot.  Patient is currently afebrile and non-toxic appearing, VS stable. No other complaints at this time. Will initiate  work-up with labs, lactic acid, blood cultures, and LE duplex study.  Labs are reassuring, no leukocytosis and hemoglobin is stable. Lactic acid is within normal limits. Renal function appears baseline given her dialysis status, potassium is within normal limits. Her INR is supra-therapeutic at greater than 10, however given her susceptibility to DVT's and prior complications, concern for reversing with vitamin K at this time. Lower extremity duplex negative for superficial thrombus or DVT.  Given patient's complex medical history and potential for worsening state, feel she would benefit from admission and IV antibiotics. Vancomycin and Zosyn started in ED. Blood cultures are pending. Case discussed with hospitalist, Dr. Ree Kida who will admit to step down.  I also discussed case with vascular surgery, Dr. Scot Dock who will evaluate pt in the ED and give recommendations.  Larene Pickett, PA-C 11/06/13 1616

## 2013-11-06 NOTE — ED Notes (Signed)
Patient was given a [place] sandwish with a sprite zero.

## 2013-11-06 NOTE — Consult Note (Signed)
[facility] [place]  Patient name: Hannah [name] MRN: [id number] DOB: 1970-11-26 Sex: female  REASON FOR CONSULT: Evaluate wounds right leg.  HPI: Hannah [name] is a 43 y.o. female seen in consultation by Dr. Oneida Alar on 09/17/2013 with an acutely ischemic right lower extremity. She had a right femoral intra-aortic balloon pump placed. Subsequent to this being removed she developed an acutely ischemic right lower extremity. On 09/17/2013, she underwent a right femoral endarterectomy, right femoral thrombectomy, and 4 compartment fasciotomy by Dr. Oneida Alar. On 09/19/2013 she had closure of her right leg fasciotomies. She subsequently developed an infected right groin wound. On 09/28/2013 she had debridement of the right groin and placement of a wound VAC by Dr. Trula Slade. This all began when she was in the hospital for congestive heart failure and renal insufficiency. Prior to her discharge, the [facility] was removed from the right groin and also from the fasciotomy sites. She was scheduled to see Dr. Trula Slade back in the office.  She was seen in the heart failure clinic today and noted to have cellulitis in her right leg. She was sent to the emergency department for further evaluation. She is being admitted by the hospitalist for cellulitis. We were asked to evaluate the wounds on her right lower extremity.  She has a complicated past medical history. She has a history of acute on chronic systolic congestive heart failure secondary to ischemic cardiomyopathy with an ejection fraction of 25-30%. In addition, she has end-stage renal disease. She has a functioning left sided tunneled dialysis catheter. She dialyzes on Monday Wednesdays and Fridays. She has a history of previous cardiac arrest. 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 In addition she has 3 vessel coronary artery disease and is not a candidate  for revascularization. She also has a known greater than 80% left carotid stenosis and a 60-79% right carotid stenosis.  She denies any history of stroke, TIAs, expressive or receptive aphasia, or amaurosis fugax.  Past Medical History  Diagnosis Date  . High cholesterol   . Diabetic retinopathy   . Peripheral neuropathy     "tips of toes"  . Blind right eye   . CHF (congestive heart failure)   . CAD (coronary artery disease)   . GERD (gastroesophageal reflux disease)   . Hypertension   . History of lung cancer 07/2011    s/p left lower lobectomy  . Diabetes mellitus     IDDM  . Cataract of right eye   . Ulcer of toe of right foot 07/10/2012    great toe  . Breast calcification, left 06/2012  . Acute biphenotypic leukemia   . CKD (chronic kidney disease) stage 5, GFR less than 15 ml/min   . Nephrotic syndrome   . Gastritis     H/o gastritis on prior endoscopy  . Anemia   . Carotid artery disease    Family History  Problem Relation Age of Onset  . Coronary artery disease Father   . Asthma Father   . COPD Father   . Hypertension Father   . Hyperlipidemia Father   . Diabetes Father   . Congestive Heart Failure Father   . Heart disease Father     before age 25  . Heart attack Father   . Peripheral vascular disease Father   . Hypertension Mother   . Hyperlipidemia Mother   . Diabetes Mother   . Cancer Mother   .  Cancer Maternal Aunt      three aunts, bone, breast, ?  . Hypertension Brother   . Diabetes Brother   . Diabetes Sister   . Colon cancer Neg Hx   . Celiac disease Neg Hx   . Crohn's disease Neg Hx   . Ulcerative colitis Neg Hx   . Lung cancer Maternal Grandmother    SOCIAL HISTORY: History  Substance Use Topics  . Smoking status: Former Smoker -- 2.00 packs/day for 30 years    Quit date: 08/01/2011  . Smokeless tobacco: Never Used  . Alcohol Use: No    Allergies  Allergen Reactions  . Crestor [Rosuvastatin] Other (See Comments)    Severe muscle  weakness  . Nsaids Other (See Comments)    Not allergic, "bad on my kidneys"  . Ciprofloxacin Rash   REVIEW OF SYSTEMS: Valu.Nieves ] denotes positive finding; [  ] denotes negative finding CARDIOVASCULAR:  [ ]  chest pain   [ ]  chest pressure   [ ]  palpitations   [ ]  orthopnea   Valu.Nieves ] dyspnea on exertion   [ ]  claudication   [ ]  rest pain   [ ]  DVT   [ ]  phlebitis PULMONARY:   [ ]  productive cough   [ ]  asthma   [ ]  wheezing NEUROLOGIC:   [ ]  weakness  [ ]  paresthesias  [ ]  aphasia  [ ]  amaurosis  [ ]  dizziness HEMATOLOGIC:   [ ]  bleeding problems   Valu.Nieves ] clotting disorders- history of heparin-induced thrombocytopenia. MUSCULOSKELETAL:  [ ]  joint pain   [ ]  joint swelling [ ]  leg swelling GASTROINTESTINAL: [ ]   blood in stool  [ ]   hematemesis GENITOURINARY:  She makes very little urine. PSYCHIATRIC:  [ ]  history of major depression INTEGUMENTARY:  Valu.Nieves ] rashes  [ ]  ulcers CONSTITUTIONAL:  [ ]  fever   [ ]  chills  PHYSICAL EXAM: Filed Vitals:   11/06/13 1015 11/06/13 1247 11/06/13 1328  BP: 90/58 92/52   Pulse: 98 88   Temp: 97.8 F (36.6 C) 97.6 F (36.4 C)   TempSrc: Oral Oral   Resp: 18 20   Height:   5' 7.5" (1.715 m)  Weight:   266 lb (120.657 kg)  SpO2: 99% 100%    Body mass index is 41.02 kg/(m^2). GENERAL: The patient is a well-nourished female, in no acute distress. The vital signs are documented above. CARDIOVASCULAR: There is a regular rate and rhythm.  PULMONARY: There is good air exchange bilaterally without wheezing or rales. ABDOMEN: Soft and non-tender with normal pitched bowel sounds.  MUSCULOSKELETAL: There are no major deformities or cyanosis. NEUROLOGIC: No focal weakness or paresthesias are detected. SKIN: I examined both of her fasciotomy sites. These appear to be granulating reasonably well I also examined her extensive right groin wound. There was some necrotic tissue which I sharply debrided in the emergency department. This wound was then repacked. She has  cellulitis of her right leg. This extends up to her proximal calf. PSYCHIATRIC: The patient has a normal affect.  DATA:  Lab Results  Component Value Date   WBC 5.8 11/06/2013   HGB 8.2* 11/06/2013   HCT 27.7* 11/06/2013   MCV 87.9 11/06/2013   PLT 453* 11/06/2013   Lab Results  Component Value Date   NA 137 11/06/2013   K 4.4 11/06/2013   CL 90* 11/06/2013   CO2 25 11/06/2013   Lab Results  Component Value Date   CREATININE 5.04* 11/06/2013  Lab Results  Component Value Date   INR >10.00* 11/06/2013   INR 9.1* 11/05/2013   INR 7.8 11/02/2013   Lab Results  Component Value Date   HGBA1C 8.0* 09/06/2013   VENOUS DUPLEX: Venous duplex scan today shows no evidence of right lower extremity DVT.  MEDICAL ISSUES: CELLULITIS RIGHT LEG: She is being admitted by the Hospitalist service for intravenous antibiotics. Her fasciotomy sites look fairly clean. I will order a dressing changes. In addition, her abdominal incision was debrided and I will order dressing changes for this wound also. If her cellulitis settles down on intravenous antibiotics she could likely be discharged on po antibiotics and she can keep her previously scheduled appointment with Dr. Trula Slade for continued wound care.  [facility] [place] Beeper: [phone]

## 2013-11-06 NOTE — ED Notes (Signed)
R lower leg noted to be red, swollen, and painful to touch. Ongoing x2 days. 9/10 pain at the time. Pedal pulses present. Able to wiggle digits. Sensation intact.

## 2013-11-06 NOTE — ED Notes (Signed)
Pt presents to department for evaluation of R lower leg pain and swelling. States she was recently discharged from hospital, history of DVT to R leg. Now states increased pain and swelling to RLE. 9/10 pain upon arrival to ED. Pt is alert and oriented x4. NAD.

## 2013-11-06 NOTE — ED Notes (Signed)
Denies cp or sob

## 2013-11-06 NOTE — Progress Notes (Addendum)
ANTIBIOTIC/ANTICOAGULATION CONSULT NOTE - INITIAL  Pharmacy Consult for Vancomycin and Zosyn and Coumadin Indication: cellulitis; R arm DVT  Allergies  Allergen Reactions  . Crestor [Rosuvastatin] Other (See Comments)    Severe muscle weakness  . Nsaids Other (See Comments)    Not allergic, "bad on my kidneys"  . Ciprofloxacin Rash    Patient Measurements: Height: 5' 7.5" (171.5 cm) Weight: 266 lb (120.657 kg) IBW/kg (Calculated) : 62.75  Vital Signs: Temp: 97.6 F (36.4 C) (07/21 1247) Temp src: Oral (07/21 1247) BP: 92/52 mmHg (07/21 1247) Pulse Rate: 88 (07/21 1247) Intake/Output from previous day:   Intake/Output from this shift:    Labs:  Recent Labs  11/06/13 1035  WBC 5.8  HGB 8.2*  PLT 453*  CREATININE 5.04*   Estimated Creatinine Clearance: 19.1 ml/min (by C-G formula based on Cr of 5.04). No results found for this basename: VANCOTROUGH, Corlis Leak, VANCORANDOM, [place], GENTPEAK, GENTRANDOM, TOBRATROUGH, TOBRAPEAK, TOBRARND, AMIKACINPEAK, AMIKACINTROU, AMIKACIN,  in the last 72 hours   Microbiology: Recent Results (from the past 720 hour(s))  CLOSTRIDIUM DIFFICILE BY PCR     Status: None   Collection Time    10/13/13  4:01 AM      Result Value Ref Range Status   C difficile by pcr NEGATIVE  NEGATIVE Final    Medical History: Past Medical History  Diagnosis Date  . High cholesterol   . Diabetic retinopathy   . Peripheral neuropathy     "tips of toes"  . Blind right eye   . CHF (congestive heart failure)   . CAD (coronary artery disease)   . GERD (gastroesophageal reflux disease)   . Hypertension   . History of lung cancer 07/2011    s/p left lower lobectomy  . Diabetes mellitus     IDDM  . Cataract of right eye   . Ulcer of toe of right foot 07/10/2012    great toe  . Breast calcification, left 06/2012  . Acute biphenotypic leukemia   . CKD (chronic kidney disease) stage 5, GFR less than 15 ml/min   . Nephrotic syndrome   . Gastritis      H/o gastritis on prior endoscopy  . Anemia   . Carotid artery disease     Medications:  See electronic med rec  Assessment: 43 y.o. female presents from HF clinic with RLE cellulitis. 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 She received broad abx during this hospitalization and required many debridements. Pt started outpatient HD last Wed - appears to be tolerating M/W/F schedule.   Anticoagulation: Coumadin PTA for R arm DVT. Coumadin stopped Demetrius Charity 7/16 for supratherapeutic INR. INR remains >10 despite no coumadin since 7/16. Hgb low but stable, plt high. No overt bleeding. LFTs wnl. Vit K 2.5mg  po ordered 7/21.  ID: To begin broad spectrum antibiotics (Vanc and Zosyn) for RLE cellulitis.  Goal of Therapy:  Pre HD vanc level 15-25 mcg/ml INR 2-3  Plan:  1. Vancomycin 2000 mg IV now then 1gm IV post HD on M/W/F 2. Zosyn 2.25gm IV q8h 3. Will f/u HD schedule, micro data, pt's clinical condition 4. Pre HD Vanc level at Css 5. Daily INR  Sherlon Handing, PharmD, BCPS Clinical pharmacist, pager [phone] 11/06/2013,2:58 PM

## 2013-11-06 NOTE — ED Notes (Signed)
Lactic acid results given to [employer], PA-C

## 2013-11-07 DIAGNOSIS — I5042 Chronic combined systolic (congestive) and diastolic (congestive) heart failure: Secondary | ICD-10-CM | POA: Diagnosis present

## 2013-11-07 LAB — GLUCOSE, CAPILLARY
GLUCOSE-CAPILLARY: 93 mg/dL (ref 70–99)
GLUCOSE-CAPILLARY: 54 mg/dL — AB (ref 70–99)
Glucose-Capillary: 152 mg/dL — ABNORMAL HIGH (ref 70–99)
Glucose-Capillary: 46 mg/dL — ABNORMAL LOW (ref 70–99)
Glucose-Capillary: 52 mg/dL — ABNORMAL LOW (ref 70–99)
Glucose-Capillary: 164 mg/dL — ABNORMAL HIGH (ref 70–99)
Glucose-Capillary: 235 mg/dL — ABNORMAL HIGH (ref 70–99)

## 2013-11-07 LAB — PROTIME-INR: INR: >10 (ref 0.00–1.49)

## 2013-11-07 MED ORDER — SODIUM CHLORIDE 0.9 % IV SOLN
62.5000 mg | INTRAVENOUS | Status: DC
  Administered 2013-11-07: 62.5 mg via INTRAVENOUS
  Filled 2013-11-07: qty 5

## 2013-11-07 MED ORDER — NEPRO/CARBSTEADY PO LIQD
237.0000 mL | ORAL | Status: DC | PRN
  Filled 2013-11-07: qty 237

## 2013-11-07 MED ORDER — ALTEPLASE 2 MG IJ SOLR
2.0000 mg | Freq: Once | INTRAMUSCULAR | Status: DC | PRN
  Filled 2013-11-07: qty 2

## 2013-11-07 MED ORDER — LIDOCAINE HCL (PF) 1 % IJ SOLN: 5.0000 mL | INTRAMUSCULAR | Status: DC | PRN

## 2013-11-07 MED ORDER — LIDOCAINE-PRILOCAINE 2.5-2.5 % EX CREA: 1.0000 "application " | TOPICAL_CREAM | CUTANEOUS | Status: DC | PRN

## 2013-11-07 MED ORDER — DOXERCALCIFEROL 4 MCG/2ML IV SOLN
2.0000 ug | INTRAVENOUS | Status: DC
  Administered 2013-11-07 – 2013-11-09 (×2): 2 ug via INTRAVENOUS
  Filled 2013-11-07 (×2): qty 2

## 2013-11-07 MED ORDER — DOXERCALCIFEROL 4 MCG/2ML IV SOLN
INTRAVENOUS | Status: AC
  Filled 2013-11-07: qty 2

## 2013-11-07 MED ORDER — HEPARIN SODIUM (PORCINE) 1000 UNIT/ML DIALYSIS
5000.0000 [IU] | Freq: Once | INTRAMUSCULAR | Status: DC
  Filled 2013-11-07: qty 5

## 2013-11-07 MED ORDER — SODIUM CHLORIDE 0.9 % IV SOLN: 100.0000 mL | INTRAVENOUS | Status: DC | PRN

## 2013-11-07 MED ORDER — PENTAFLUOROPROP-TETRAFLUOROETH EX AERO: 1.0000 "application " | INHALATION_SPRAY | CUTANEOUS | Status: DC | PRN

## 2013-11-07 MED ORDER — DARBEPOETIN ALFA-POLYSORBATE 150 MCG/0.3ML IJ SOLN
150.0000 ug | INTRAMUSCULAR | Status: DC
  Administered 2013-11-07: 150 ug via INTRAVENOUS
  Filled 2013-11-07: qty 0.3

## 2013-11-07 MED ORDER — HEPARIN SODIUM (PORCINE) 1000 UNIT/ML DIALYSIS: 1000.0000 [IU] | INTRAMUSCULAR | Status: DC | PRN

## 2013-11-07 MED ORDER — FENTANYL CITRATE 0.05 MG/ML IJ SOLN
12.5000 ug | INTRAMUSCULAR | Status: DC | PRN
  Administered 2013-11-07 – 2013-11-10 (×8): 25 ug via INTRAVENOUS
  Filled 2013-11-07 (×9): qty 2

## 2013-11-07 MED ORDER — DEXTROSE 50 % IV SOLN
INTRAVENOUS | Status: AC
  Filled 2013-11-07: qty 50

## 2013-11-07 MED ORDER — DEXTROSE 50 % IV SOLN
25.0000 mL | Freq: Once | INTRAVENOUS | Status: AC
Start: 2013-11-07 — End: 2013-11-07
  Administered 2013-11-07: 25 mL via INTRAVENOUS
  Filled 2013-11-07: qty 50

## 2013-11-07 MED ORDER — PHYTONADIONE 5 MG PO TABS
2.5000 mg | ORAL_TABLET | Freq: Once | ORAL | Status: AC
  Administered 2013-11-07: 2.5 mg via ORAL
  Filled 2013-11-07: qty 1

## 2013-11-07 MED ORDER — ANTICOAGULANT SODIUM CITRATE 4% (200MG/5ML) IV SOLN
5.0000 mL | Freq: Once | Status: DC
Start: 2013-11-07 — End: 2013-11-07

## 2013-11-07 MED ORDER — DARBEPOETIN ALFA-POLYSORBATE 150 MCG/0.3ML IJ SOLN
INTRAMUSCULAR | Status: AC
  Filled 2013-11-07: qty 0.3

## 2013-11-07 NOTE — Progress Notes (Signed)
Hypoglycemic Event  CBG:52  Treatment: Dinner tray recheck 54, dextrose 25cc IV given  Symptoms: None  Follow-up CBG: Time 1916 CBG Result:54  Possible Reasons for Event: poor oral intake  Comments/MD notified:Dr Mcclung    Livie Vanderhoof, Jolene Schimke  Remember to initiate Hypoglycemia Order Set & complete

## 2013-11-07 NOTE — ED Provider Notes (Signed)
Patient discussed at length with Bernarda Caffey PA. I've evaluated the patient and family. Her leg appears erythematous. She is supratherapeutic and shows no signs of clot. Plan will be treatment for possible cellulitis. She is admitted.  Tanna Furry, MD 11/07/13 1019

## 2013-11-07 NOTE — Consult Note (Signed)
Indication for Consultation:  Management of ESRD/hemodialysis; anemia, hypertension/volume and secondary hyperparathyroidism  HPI: Hannah [name] is a 43 y.o. female admitted yesterday for evaluation of RLE redness and swelling. She receives HD MWF @ [place]. Hx HTN, DM CAD. She was DCd last week after an extended admission in which she had heart cath requiring placement of swan-ganz cath and  cardiogenic shock as well as introduction to dialysis. She went to the OR 6/1 for Right femoral thrboembolectomy with compartment fasciotomy. She noticed right leg redness and increased swelling on Sunday. She started to develop leg pain as well and presented to the Ed yesterday for evaluation. She had been off her coumadin since Friday for increased INR.which had continued to trend higher despite holding coumadin. Dopplers were negative for DVT.  She last received HD Monday without complication, will schedule HD today. Leg is extremely erythematous- she says it started Sunday- did not bring it to anyone's attention on Monday at dialysis- I told her that she could let the nurses know there  Past Medical History  Diagnosis Date  . High cholesterol   . Diabetic retinopathy   . Peripheral neuropathy     "tips of toes"  . Blind right eye   . CHF (congestive heart failure)   . CAD (coronary artery disease)   . GERD (gastroesophageal reflux disease)   . Hypertension   . History of lung cancer 07/2011    s/p left lower lobectomy  . Diabetes mellitus     IDDM  . Cataract of right eye   . Ulcer of toe of right foot 07/10/2012    great toe  . Breast calcification, left 06/2012  . Acute biphenotypic leukemia   . CKD (chronic kidney disease) stage 5, GFR less than 15 ml/min   . Nephrotic syndrome   . Gastritis     H/o gastritis on prior endoscopy  . Anemia   . Carotid artery disease    Past Surgical History  Procedure Laterality Date  . Cardiac catheterization  07/16/2011  . Incision and drainage breast  abscess Left   . Tubal ligation  1994  . Vitrectomy  2010    2 on left, 1 on right  . Cesarean section  1991; 1994  . Video assisted thoracoscopy (vats)/ lobectomy Left 07/30/2011    left main thoracotomy, left lower lobectomy, mediastinal lymph node dissection  . Lobectomy    . Colonoscopy with esophagogastroduodenoscopy (egd) N/A 08/14/2012    Procedure: COLONOSCOPY WITH ESOPHAGOGASTRODUODENOSCOPY (EGD);  Surgeon: Danie Binder, MD;  Location[facility];  Service: Endoscopy;  Laterality: N/A;  10:45-moved to [address] to notify pt  . Breast lumpectomy with needle localization Left 11/14/2012    Procedure: BREAST LUMPECTOMY WITH NEEDLE LOCALIZATION;  Surgeon: Marcello Moores A. Cornett, MD;  Location: [place];  Service: General;  Laterality: Left;  . Insertion of dialysis catheter Left 09/12/2013    Procedure: INSERTION OF DIALYSIS CATHETER;  Surgeon: Mal Misty, MD;  Location: [place];  Service: Vascular;  Laterality: Left;  . Embolectomy Right 09/17/2013    Procedure: Thrombectomy of Right Common Femoral Artery;  Surgeon: Elam Dutch, MD;  Location: [facility] OR;  Service: Vascular;  Laterality: Right;  . Endarterectomy femoral Right 09/17/2013    Procedure: Right Femoral Endarterectomy;  Surgeon: Elam Dutch, MD;  Location: [place];  Service: Vascular;  Laterality: Right;  . Patch angioplasty Right 09/17/2013    Procedure: Vein Patch Angioplasty of Right Femoral Artery;  Surgeon: Juanda Crumble  Antony Blackbird, MD;  Location: [place];  Service: Vascular;  Laterality: Right;  . Fasciotomy Right 09/17/2013    Procedure: Four Compartment Fasciotomy;  Surgeon: Elam Dutch, MD;  Location: [place];  Service: Vascular;  Laterality: Right;  . Fasciotomy closure Right 09/19/2013    Procedure: FASCIOTOMY CLOSURE;  Surgeon: Elam Dutch, MD;  Location: [place];  Service: Vascular;  Laterality: Right;  regional block and monitored anesthesia care used  . I&d extremity Right 09/28/2013    Procedure: IRRIGATION AND  DEBRIDEMENT EXTREMITY;  Surgeon: Serafina Mitchell, MD;  Location: [facility] OR;  Service: Vascular;  Laterality: Right;  . Application of wound vac Right 09/28/2013    Procedure: APPLICATION OF WOUND VAC;  Surgeon: Serafina Mitchell, MD;  Location: [facility] OR;  Service: Vascular;  Laterality: Right;   Family History  Problem Relation Age of Onset  . Coronary artery disease Father   . Asthma Father   . COPD Father   . Hypertension Father   . Hyperlipidemia Father   . Diabetes Father   . Congestive Heart Failure Father   . Heart disease Father     before age 33  . Heart attack Father   . Peripheral vascular disease Father   . Hypertension Mother   . Hyperlipidemia Mother   . Diabetes Mother   . Cancer Mother   . Cancer Maternal Aunt      three aunts, bone, breast, ?  . Hypertension Brother   . Diabetes Brother   . Diabetes Sister   . Colon cancer Neg Hx   . Celiac disease Neg Hx   . Crohn's disease Neg Hx   . Ulcerative colitis Neg Hx   . Lung cancer Maternal Grandmother    Social History:  reports that she quit smoking about 2 years ago. She has never used smokeless tobacco. She reports that she does not drink alcohol or use illicit drugs. Allergies  Allergen Reactions  . Crestor [Rosuvastatin] Other (See Comments)    Severe muscle weakness  . Nsaids Other (See Comments)    Not allergic, "bad on my kidneys"  . Ciprofloxacin Rash   Prior to Admission medications   Medication Sig Start Date End Date Taking? Authorizing Provider  acetaminophen (TYLENOL) 500 MG tablet Take 500 mg by mouth every 8 (eight) hours as needed for mild pain.   Yes Historical Provider, MD  aspirin EC 81 MG EC tablet Take 1 tablet (81 mg total) by mouth daily. 08/17/13  Yes Belkys A Regalado, MD  atorvastatin (LIPITOR) 80 MG tablet Take 1 tablet (80 mg total) by mouth daily at 6 PM. 10/29/13  Yes Amy D Clegg, NP  calcitRIOL (ROCALTROL) 0.25 MCG capsule Take 1 capsule (0.25 mcg total) by mouth daily. 10/29/13  Yes Amy D  Clegg, NP  cholecalciferol (VITAMIN D) 1000 UNITS tablet Take 1,000 Units by mouth daily.   Yes Historical Provider, MD  collagenase (SANTYL) ointment Apply topically daily. 10/29/13  Yes Amy D Clegg, NP  cyclobenzaprine (FLEXERIL) 10 MG tablet Take 5-10 mg by mouth 3 (three) times daily as needed for muscle spasms. 07/06/13  Yes Alycia Rossetti, MD  Insulin Detemir (LEVEMIR FLEXPEN) 100 UNIT/ML Pen Inject 20 Units into the skin every evening. 10/29/13  Yes Amy D Clegg, NP  insulin lispro (HUMALOG KWIKPEN) 100 UNIT/ML KiwkPen Inject 0.05 mLs (5 Units total) into the skin 3 (three) times daily with meals. 10/29/13  Yes Amy D Clegg, NP  methylcellulose (ARTIFICIAL TEARS) 1 %  ophthalmic solution Place 1 drop into both eyes at bedtime as needed (dry eyes).   Yes Historical Provider, MD  midodrine (PROAMATINE) 10 MG tablet Take 1 tablet (10 mg total) by mouth 3 (three) times daily with meals. 10/29/13  Yes Amy D Clegg, NP  ondansetron (ZOFRAN) 4 MG tablet Take 1 tablet (4 mg total) by mouth every 8 (eight) hours as needed for nausea or vomiting. 10/29/13  Yes Amy D Clegg, NP  oxyCODONE-acetaminophen (PERCOCET/ROXICET) 5-325 MG per tablet Take 1-2 tablets by mouth every 8 (eight) hours as needed for moderate pain or severe pain. 10/29/13  Yes Amy D Clegg, NP  pantoprazole (PROTONIX) 40 MG tablet Take 1 tablet (40 mg total) by mouth daily. 10/29/13  Yes Amy D Clegg, NP  polyethylene glycol (MIRALAX / GLYCOLAX) packet Take 17 g by mouth daily as needed (constipation). 10/29/13  Yes Amy D Clegg, NP  warfarin (COUMADIN) 2 MG tablet Take 1 tablet (2 mg total) by mouth daily at 6 PM. 10/29/13  Yes Amy D Clegg, NP   Current Facility-Administered Medications  Medication Dose Route Frequency Provider Last Rate Last Dose  . 0.9 %  sodium chloride infusion  250 mL Intravenous PRN Maryann Mikhail, DO      . acetaminophen (TYLENOL) tablet 500 mg  500 mg Oral Q8H PRN Maryann Mikhail, DO      . aspirin EC tablet 81 mg  81 mg  Oral Daily Maryann Mikhail, DO   81 mg at 11/06/13 2030  . atorvastatin (LIPITOR) tablet 80 mg  80 mg Oral q1800 Maryann Mikhail, DO      . calcitRIOL (ROCALTROL) capsule 0.25 mcg  0.25 mcg Oral Daily Maryann Mikhail, DO   0.25 mcg at 11/06/13 2030  . cholecalciferol (VITAMIN D) tablet 1,000 Units  1,000 Units Oral Daily Maryann Mikhail, DO   1,000 Units at 11/06/13 2030  . collagenase (SANTYL) ointment   Topical Daily Maryann Mikhail, DO      . cyclobenzaprine (FLEXERIL) tablet 5-10 mg  5-10 mg Oral TID PRN Maryann Mikhail, DO      . fentaNYL (SUBLIMAZE) injection 12.5-25 mcg  12.5-25 mcg Intravenous Q1H PRN Ulyses Amor, PA-C   25 mcg at 11/07/13 0901  . insulin aspart (novoLOG) injection 0-9 Units  0-9 Units Subcutaneous TID WC Maryann Mikhail, DO   2 Units at 11/07/13 0844  . insulin aspart (novoLOG) injection 5 Units  5 Units Subcutaneous TID WC Maryann Mikhail, DO   5 Units at 11/07/13 0844  . insulin detemir (LEVEMIR) injection 20 Units  20 Units Subcutaneous QPM Maryann Mikhail, DO   20 Units at 11/06/13 2030  . midodrine (PROAMATINE) tablet 10 mg  10 mg Oral TID WC Maryann Mikhail, DO   10 mg at 11/07/13 0843  . ondansetron (ZOFRAN) tablet 4 mg  4 mg Oral Q8H PRN Maryann Mikhail, DO      . oxyCODONE-acetaminophen (PERCOCET/ROXICET) 5-325 MG per tablet 1-2 tablet  1-2 tablet Oral Q8H PRN Cristal Ford, DO   2 tablet at 11/07/13 0420  . pantoprazole (PROTONIX) EC tablet 40 mg  40 mg Oral Daily Maryann Mikhail, DO      . piperacillin-tazobactam (ZOSYN) IVPB 2.25 g  2.25 g Intravenous Q8H Maryann Mikhail, DO   2.25 g at 11/07/13 0400  . polyethylene glycol (MIRALAX / GLYCOLAX) packet 17 g  17 g Oral Daily PRN Maryann Mikhail, DO      . polyvinyl alcohol (LIQUIFILM TEARS) 1.4 % ophthalmic solution 1 drop  1  drop Both Eyes QHS PRN Maryann Mikhail, DO      . sodium chloride 0.9 % injection 3 mL  3 mL Intravenous Q12H Maryann Mikhail, DO   3 mL at 11/06/13 2208  . sodium chloride 0.9 %  injection 3 mL  3 mL Intravenous PRN Maryann Mikhail, DO      . vancomycin (VANCOCIN) IVPB 1000 mg/200 mL premix  1,000 mg Intravenous Q M,W,F-HD Juanda Chance Amend, [facility]      . Warfarin - Pharmacist Dosing Inpatient   Does not apply Millard, [facility]       Labs: Basic Metabolic Panel:  Recent Labs Lab 11/06/13 1035  NA 137  K 4.4  CL 90*  CO2 25  GLUCOSE 144*  BUN 34*  CREATININE 5.04*  CALCIUM 8.8   Liver Function Tests:  Recent Labs Lab 11/06/13 1035  AST 23  ALT 20  ALKPHOS 146*  BILITOT 0.3  PROT 6.2  ALBUMIN 2.6*   No results found for this basename: LIPASE, AMYLASE,  in the last 168 hours No results found for this basename: AMMONIA,  in the last 168 hours CBC:  Recent Labs Lab 11/06/13 1035  WBC 5.8  NEUTROABS 4.1  HGB 8.2*  HCT 27.7*  MCV 87.9  PLT 453*   Cardiac Enzymes: No results found for this basename: CKTOTAL, CKMB, CKMBINDEX, TROPONINI,  in the last 168 hours CBG:  Recent Labs Lab 11/06/13 1839 11/06/13 2228 11/07/13 0740  GLUCAP 167* 274* 152*   Iron Studies: No results found for this basename: IRON, TIBC, TRANSFERRIN, FERRITIN,  in the last 72 hours Studies/Results: No results found.   Review of Systems: Negative except for worsening RLE redness, swelling and pain since Sunday. Slow healing fasciotomy and R groin cath site incision.   Physical Exam: Filed Vitals:   11/07/13 0000 11/07/13 0423 11/07/13 0742 11/07/13 0800  BP: 96/44 108/80 117/73 103/63  Pulse: 91 100 91 92  Temp: 98 F (36.7 C) 97.8 F (36.6 C) 98 F (36.7 C)   TempSrc: Oral Oral Oral   Resp: 17 12 12 20   Height:      Weight:  125.8 kg (277 lb 5.4 oz)    SpO2: 100% 98% 100% 98%     General: Well developed, well nourished, in no acute distress. Head: Normocephalic, atraumatic, sclera non-icteric, mucus membranes are moist Neck: Supple. JVD not elevated. Lungs: Clear bilaterally to auscultation without wheezes, rales, or rhonchi. Breathing is  unlabored. Heart: RRR with S1 S2. No murmurs, rubs, or gallops appreciated. Abdomen: Soft, obese non-tender, non-distended with normoactive bowel sounds. No rebound/guarding. No obvious abdominal masses. M-S:  Strength and tone appear normal for age. Lower extremities: bilat LE edema R>L. RLE erythema and warmth with 2 fasciotomy incisions dressings dry and intact. R groin old cath site dressing dry and intact Neuro: Alert and oriented X 3. Moves all extremities spontaneously. Psych:  Responds to questions appropriately with a normal affect. Dialysis Access: L IJ cath  Dialysis Orders:  MWF @ [place].  120kgs  2K/2.25Ca 4hr 5000 Heparin LIJcath  500/1.5   hectorol 2 mcg IV/HD Aranesp 150 q week  Venofer  50q week  Assessment/Plan: 1.  RLE cellulitis- vanc and zosyn per primary. Dopplers negative for DVT. Blood cultures pending. VVS following.  2. supratherapeutic INR- >10. management per primary, Vit K given, coumadin on hold. 3.  ESRD -  MWF @ [place], HD today. K+4.4- via PC for now- plans for permanent access ongoing per vascular  4.  HyPOtension/volume  - 103/63, on midodine- amazing she was able to come off dobutamine 5.  Anemia  - hgb 8.2 cont Aranesp and Fe today 6.  Metabolic bone disease -  Ca+ 8.8. Cont hectorol 7.  Nutrition - heart healthy/carb modified w fluid restriction. alb 2.6 8. DM- per primary 9. Perm access- eval pending with VVS  Shelle Iron, NP [facility] Murlean Hark [phone] 11/07/2013, 9:26 AM   Patient seen and examined, agree with above note with above modifications. 43 year old WF well known to [name] with recent new start to HD when hospitalized for a prolonged period with CHF/cardiogenic shock and also ischemic leg/compartment syndrome of leg requiring operative management after balloon pump.  She now presents with cellulitis of that extremity.  Regarding ESRD- initiated dialysis last admission and it actually has been going well.  Will continue  with routine HD while she gets IV abx for cellulitits.  Corliss Parish, MD 11/07/2013

## 2013-11-07 NOTE — Progress Notes (Signed)
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 She was most recently hospitalized during June of 2015 for protracted period of time and was just discharged 8 days prior to this presentation. She had a complicated hospitalization and began with a heart catheterization with subsequent cardiogenic shock, IABP placement. Subsequently she developed ischemic lower extremity that required vascular surgery consultation and eventual femoral thromboembolectomy with 4 compartment fasciotomy to the right lower extremity. At time of discharge her groin and leg wounds were healing nicely and plans were to continue followup with the vascular surgeons. About 3-4 days ago she began having more than tenderness the right lower extremity with associated redness. She has also been having difficulties with supratherapeutic INRs with resultant changes in her Coumadin dosage including holding her Coumadin. Because of these ongoing symptoms with color change and pain she presented to the emergency department.  In the emergency department laboratory data revealed a continued supratherapeutic INR greater than 10 as well as a pro time greater than 90 the hemoglobin appeared stable at 8.2. The emergency room physician ordered a vascular ultrasound that revealed no evidence of DVT. She was afebrile and her vital signs were stable although her systolic blood pressure was slightly low in the 90s. Her white count was 5800. While in the emergency department she was also evaluated by vascular surgery. Because of necrotic tissue seen on initial exam the vascular surgeons sharply debrided these areas and repacked the wound. Other than that the vascular  surgeon felt that her fasciotomy sites looked clean. She also had an abdominal incision that also required some minor debridement of necrotic tissue but otherwise also looked clean and recommendations were to continue IV antibiotics and followup with Dr. Trula Slade as previously scheduled.  HPI/Subjective: No significant discomfort in the right lower extrimity. Patient endorsing some nausea but endorses that this is also a chronic problem for her.  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 as opposed to active cellulitis noting patient had no fever and no leukocytosis at presentation and was not on antibiotic therapy prior to admission    Supratherapeutic INR -Was given 2.5 mg vitamin K at admission without any improvement in INR -repeat vitamin K dose today - followup INR 5 PM    Chronic combined systolic and diastolic CHF, NYHA class 3 -Appears compensated at this time with primary management with dialysis treatments    Diabetic retinopathy associated with type 2 diabetes mellitus -Continue home dose Levemir with sliding scale insulin -Hemoglobin A1c in May was 8.0 -Current CBGs reasonably controlled     Venous insufficiency/recent DVT of axillary vein, acute right -DVT diagnosed June 4 -DVT and recent thromboembolectomy rationale for continued anticoagulation    Essential hypertension, benign -Appears resolved with initiation of dialysis treatments in the setting of chronic systolic heart failure/low output cardiac failure -Off antihypertensive medications and actually requires midodrine to support blood pressure      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 -Dialysis per nephrology    Stage I Adenocarcinoma of  lung -Status post lobectomy by VATS April 2013 that did not  require postresection chemotherapy    Gastroparesis/GERD  -Chronic and according to patient at baseline -Continue Protonix    Normocytic anemia -Hemoglobin stable and at baseline which is greater than 8.0  DVT prophylaxis: Supratherapeutic INR on warfarin Code Status: Full Family Communication: No family at bedside Disposition Plan/Expected LOS: Transfer to floor  Consultants: VVS  Procedures: Right lower extremity venous duplex  Cultures: Blood cultures pending  Antibiotics: Zosyn 7/21 >>> Vancomycin 7/21 >>>  Objective: Blood pressure 123/59, pulse 84, temperature 97.8 F (36.6 C), temperature source Oral, resp. rate 12, height 5' 7.5" (1.715 m), weight 122.8 kg (270 lb 11.6 oz), SpO2 96.00%.  Intake/Output Summary (Last 24 hours) at 11/07/13 1748 Last data filed at 11/07/13 0908  Gross per 24 hour  Intake    240 ml  Output      1 ml  Net    239 ml   Exam: General: No acute respiratory distress Lungs: Clear to auscultation bilaterally without wheezes or crackles Cardiovascular: Regular rate and rhythm without murmur gallop or rub normal S1 and S2, 1+ peripheral edema bilateral lower extremity or JVD Abdomen: Nontender, nondistended, soft, bowel sounds positive, no rebound, no ascites, no appreciable mass Musculoskeletal: No significant cyanosis, clubbing of bilateral lower extremities-note vivid red circumferential area of erythema involving the right lower ext just below the knee-known fasciotomy sites dressed and clean and dry-nontender with minimal palpation  Scheduled Meds:  Scheduled Meds: . aspirin EC  81 mg Oral Daily  . atorvastatin  80 mg Oral q1800  . cholecalciferol  1,000 Units Oral Daily  . collagenase   Topical Daily  . darbepoetin      . darbepoetin (ARANESP) injection - DIALYSIS  150 mcg Intravenous Q Wed-HD  . doxercalciferol      . doxercalciferol  2 mcg Intravenous Q M,W,F-HD  .  ferric gluconate (FERRLECIT/NULECIT) IV  62.5 mg Intravenous Weekly  . heparin  5,000 Units Dialysis Once in dialysis  . insulin aspart  0-9 Units Subcutaneous TID WC  . insulin aspart  5 Units Subcutaneous TID WC  . insulin detemir  20 Units Subcutaneous QPM  . midodrine  10 mg Oral TID WC  . pantoprazole  40 mg Oral Daily  . piperacillin-tazobactam (ZOSYN)  IV  2.25 g Intravenous Q8H  . sodium chloride  3 mL Intravenous Q12H  . vancomycin  1,000 mg Intravenous Q M,W,F-HD  . Warfarin - Pharmacist Dosing Inpatient   Does not apply q1800   Data Reviewed: Basic Metabolic Panel:  Recent Labs Lab 11/06/13 1035  NA 137  K 4.4  CL 90*  CO2 25  GLUCOSE 144*  BUN 34*  CREATININE 5.04*  CALCIUM 8.8   Liver Function Tests:  Recent Labs Lab 11/06/13 1035  AST 23  ALT 20  ALKPHOS 146*  BILITOT 0.3  PROT 6.2  ALBUMIN 2.6*   CBC:  Recent Labs Lab 11/06/13 1035  WBC 5.8  NEUTROABS 4.1  HGB 8.2*  HCT 27.7*  MCV 87.9  PLT 453*   CBG:  Recent Labs Lab 11/06/13 1839 11/06/13 2228 11/07/13 0740 11/07/13 1147  GLUCAP 167* 274* 152* 93    Recent Results (from the past 240 hour(s))  CULTURE, BLOOD (ROUTINE X 2)     Status: None   Collection Time    11/06/13 11:15 AM      Result Value Ref Range Status   Specimen Description BLOOD RIGHT ANTECUBITAL   Final   Special Requests BOTTLES DRAWN AEROBIC AND ANAEROBIC 5CC  Final   Culture  Setup Time     Final   Value: 11/06/2013 17:12     Performed at [facility]   Culture     Final   Value:        BLOOD CULTURE RECEIVED NO GROWTH TO DATE CULTURE WILL BE HELD FOR 5 DAYS BEFORE ISSUING A FINAL NEGATIVE REPORT     Performed at [facility]   Report Status PENDING   Incomplete  CULTURE, BLOOD (ROUTINE X 2)     Status: None   Collection Time    11/06/13 11:30 AM      Result Value Ref Range Status   Specimen Description BLOOD RIGHT FOREARM   Final   Special Requests BOTTLES DRAWN AEROBIC AND ANAEROBIC 4CC    Final   Culture  Setup Time     Final   Value: 11/06/2013 17:12     Performed at [facility]   Culture     Final   Value:        BLOOD CULTURE RECEIVED NO GROWTH TO DATE CULTURE WILL BE HELD FOR 5 DAYS BEFORE ISSUING A FINAL NEGATIVE REPORT     Performed at [facility]   Report Status PENDING   Incomplete  MRSA PCR SCREENING     Status: None   Collection Time    11/06/13  7:45 PM      Result Value Ref Range Status   MRSA by PCR NEGATIVE  NEGATIVE Final   Comment:            The GeneXpert MRSA Assay (FDA     approved for NASAL specimens     only), is one component of a     comprehensive MRSA colonization     surveillance program. It is not     intended to diagnose MRSA     infection nor to guide or     monitor treatment for     MRSA infections.     Studies:  Recent x-ray studies have been reviewed in detail by the Attending Physician  Time spent : [place], ANP [facility] Office  [phone] Pager [phone]   **If unable to reach the above provider after paging please contact the [place] @ [phone]  On-Call/Text Page:      [email or web address]      password [email or web address]  If 7PM-7AM, please contact night-coverage [email or web address] Password [email or web address] 11/07/2013, 5:48 PM   LOS: 1 day   I have personally examined this patient and reviewed the entire database. I have reviewed the above note, made any necessary editorial changes, and agree with its content.  Cherene Altes, MD [facility]

## 2013-11-07 NOTE — Progress Notes (Signed)
ANTIBIOTIC/ANTICOAGULATION CONSULT NOTE - F/U  Pharmacy Consult for Coumadin Indication: R arm DVT  Allergies  Allergen Reactions  . Crestor [Rosuvastatin] Other (See Comments)    Severe muscle weakness  . Nsaids Other (See Comments)    Not allergic, "bad on my kidneys"  . Ciprofloxacin Rash    Patient Measurements: Height: 5' 7.5" (171.5 cm) Weight: 277 lb 5.4 oz (125.8 kg) IBW/kg (Calculated) : 62.75  Vital Signs: Temp: 98.3 F (36.8 C) (07/22 1148) Temp src: Oral (07/22 1148) BP: 127/71 mmHg (07/22 1200) Pulse Rate: 86 (07/22 1200) Intake/Output from previous day: 07/21 0701 - 07/22 0700 In: -  Out: 1 [Stool:1] Intake/Output from this shift: Total I/O In: 240 [P.O.:240] Out: -   Labs:  Recent Labs  11/06/13 1035  WBC 5.8  HGB 8.2*  PLT 453*  CREATININE 5.04*   Estimated Creatinine Clearance: 19.6 ml/min (by C-G formula based on Cr of 5.04).  Medications:  See electronic med rec  Assessment: 43 y.o. female presents from HF clinic with RLE cellulitis. 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 She received broad abx during this hospitalization and required many debridements. Pt started outpatient HD last Wed - appears to be tolerating M/W/F schedule.   Anticoagulation: Coumadin PTA for R arm DVT. Coumadin stopped Demetrius Charity 7/16 for supratherapeutic INR. INR remains >10 despite no coumadin since 7/16. Hgb low but stable, plt high. No overt bleeding. LFTs wnl. Vit K 2.5mg  po x 1 dose given 7/21 2nd dose ordered 7/22.  Goal of Therapy: INR 2-3  Plan:  1. Continue to hold warfarin based on supratherapeutic INR 2. Daily PT/INR  Elicia Lamp, PharmD Clinical Pharmacist - Resident Pager [phone] 7/22/20151:12 PM

## 2013-11-07 NOTE — Progress Notes (Signed)
   VASCULAR PROGRESS NOTE  SUBJECTIVE: No complaints, except wants some IV pain med for dressing change  PHYSICAL EXAM: Filed Vitals:   11/06/13 1837 11/06/13 2053 11/07/13 0000 11/07/13 0423  BP:  101/48 96/44 108/80  Pulse:  92 91 100  Temp: 98.1 F (36.7 C) 97.6 F (36.4 C) 98 F (36.7 C) 97.8 F (36.6 C)  TempSrc: Oral Oral Oral Oral  Resp:  16 17 12   Height:      Weight:    277 lb 5.4 oz (125.8 kg)  SpO2:  99% 100% 98%   Cellulitis right LE unchanged.  LABS: Lab Results  Component Value Date   WBC 5.8 11/06/2013   HGB 8.2* 11/06/2013   HCT 27.7* 11/06/2013   MCV 87.9 11/06/2013   PLT 453* 11/06/2013   Lab Results  Component Value Date   CREATININE 5.04* 11/06/2013   Lab Results  Component Value Date   INR >10.00* 11/06/2013   CBG (last 3)   Recent Labs  11/06/13 1839 11/06/13 2228  GLUCAP 167* 274*    Principal Problem:   Cellulitis of right lower extremity Active Problems:   GERD (gastroesophageal reflux disease)   Normocytic anemia   CHF (congestive heart failure)   Ischemia of foot   Cellulitis of right leg   ESRD (end stage renal disease) on dialysis   Supratherapeutic INR   ASSESSMENT AND PLAN:  * Cellulitis L LE on Vanco and Zosyn  * Continue dressing changes to right groin and Bilat Fasciotomy sites.    * Todays INR pending  Gae Gallop Beeper: [phone] 11/07/2013

## 2013-11-07 NOTE — Progress Notes (Signed)
    Added Fentanyl 12.5-25 IVP for dressing changes for pain control.  Dequavius Kuhner MAUREEN PA-C

## 2013-11-07 NOTE — Procedures (Signed)
Patient was seen on dialysis and the procedure was supervised.  BFR 400  Via PC BP is  132/93.   Patient appears to be tolerating treatment well  Yulia Ulrich A 11/07/2013

## 2013-11-07 NOTE — Consult Note (Signed)
WOC wound consult note Reason for Consult: WOC consult for bilat fasciotomy sites and cellulitis requested prior to VVS team involvement.  They are now following for assessment and plan of care; refer to progress notes on 7/22 and defer to this team for further questions. Please re-consult if further assistance is needed.  Thank-you,  Julien Girt MSN, [place], [place], [place], [place]

## 2013-11-08 LAB — RENAL FUNCTION PANEL
ALBUMIN: 2.3 g/dL — AB (ref 3.5–5.2)
Anion gap: 14 (ref 5–15)
BUN: 21 mg/dL (ref 6–23)
CHLORIDE: 96 meq/L (ref 96–112)
CO2: 26 mEq/L (ref 19–32)
Calcium: 8.1 mg/dL — ABNORMAL LOW (ref 8.4–10.5)
Creatinine, Ser: 4.21 mg/dL — ABNORMAL HIGH (ref 0.50–1.10)
GFR calc Af Amer: 14 mL/min — ABNORMAL LOW (ref >90)
GFR, EST NON AFRICAN AMERICAN: 12 mL/min — AB (ref >90)
Glucose, Bld: 154 mg/dL — ABNORMAL HIGH (ref 70–99)
PHOSPHORUS: 5.3 mg/dL — AB (ref 2.3–4.6)
POTASSIUM: 4.1 meq/L (ref 3.7–5.3)
Sodium: 136 mEq/L — ABNORMAL LOW (ref 137–147)

## 2013-11-08 LAB — GLUCOSE, CAPILLARY
GLUCOSE-CAPILLARY: 156 mg/dL — AB (ref 70–99)
GLUCOSE-CAPILLARY: 54 mg/dL — AB (ref 70–99)
Glucose-Capillary: 180 mg/dL — ABNORMAL HIGH (ref 70–99)
Glucose-Capillary: 64 mg/dL — ABNORMAL LOW (ref 70–99)
Glucose-Capillary: 111 mg/dL — ABNORMAL HIGH (ref 70–99)

## 2013-11-08 LAB — CBC
HCT: 29.2 % — ABNORMAL LOW (ref 36.0–46.0)
Hemoglobin: 8.7 g/dL — ABNORMAL LOW (ref 12.0–15.0)
MCH: 26.1 pg (ref 26.0–34.0)
MCHC: 29.8 g/dL — AB (ref 30.0–36.0)
MCV: 87.7 fL (ref 78.0–100.0)
PLATELETS: 405 10*3/uL — AB (ref 150–400)
RBC: 3.33 MIL/uL — ABNORMAL LOW (ref 3.87–5.11)
RDW: 17.6 % — AB (ref 11.5–15.5)
WBC: 5.4 10*3/uL (ref 4.0–10.5)

## 2013-11-08 LAB — PROTIME-INR
INR: 3.22 — AB (ref 0.00–1.49)
PROTHROMBIN TIME: 32.9 s — AB (ref 11.6–15.2)

## 2013-11-08 MED ORDER — DIPHENHYDRAMINE HCL 25 MG PO CAPS
25.0000 mg | ORAL_CAPSULE | Freq: Four times a day (QID) | ORAL | Status: DC | PRN
  Administered 2013-11-09 (×2): 25 mg via ORAL
  Filled 2013-11-08 (×2): qty 1

## 2013-11-08 MED ORDER — WARFARIN 0.5 MG HALF TABLET
0.5000 mg | ORAL_TABLET | Freq: Once | ORAL | Status: AC
  Administered 2013-11-08: 0.5 mg via ORAL
  Filled 2013-11-08: qty 1

## 2013-11-08 MED ORDER — INSULIN DETEMIR 100 UNIT/ML ~~LOC~~ SOLN
20.0000 [IU] | Freq: Every day | SUBCUTANEOUS | Status: DC
  Administered 2013-11-08 – 2013-11-09 (×2): 20 [IU] via SUBCUTANEOUS
  Filled 2013-11-08 (×3): qty 0.2

## 2013-11-08 NOTE — Evaluation (Signed)
Physical Therapy Evaluation Patient Details Name: Hannah [name] MRN: [id number] DOB: March 19, 1971 Today's Date: 11/08/2013   History of Present Illness  43 year old recently discharged from prolonged  hospitalization with history of CHF, DM, ESRD, HTN admitted with right lower leg pain and edema, without evidence of DVT -treating as cellulitis  Clinical Impression  Pt admitted with RLE cellulitis after recent discharge from a prolonged hospitalization. Pt reports HHPT never began while she was home. Pt very motivated to return to prior functional status (independent with no device). Pt currently with functional limitations due to the deficits listed below (see PT Problem List).  Pt will benefit from skilled PT to increase their independence and safety with mobility to allow discharge to the venue listed below.        Follow Up Recommendations Home health PT;Supervision for mobility/OOB    Equipment Recommendations  None recommended by PT    Recommendations for Other Services       Precautions / Restrictions Precautions Precautions: Fall Precaution Comments: decreased sensation right leg/foot, edema right lower leg      Mobility  Bed Mobility Overal bed mobility: Modified Independent Bed Mobility: Sit to Supine       Sit to supine: Modified independent (Device/Increase time)   General bed mobility comments: able to get into bed and scoot to [facility] with use of rails without cues or assist  Transfers Overall transfer level: Needs assistance Equipment used: Rolling walker (2 wheeled) Transfers: Sit to/from Stand Sit to Stand: Supervision            Ambulation/Gait Ambulation/Gait assistance: Min guard Ambulation Distance (Feet): 150 Feet Assistive device: Rolling walker (2 wheeled) Gait Pattern/deviations: Step-through pattern;Decreased step length - right Gait velocity: Decreased   General Gait Details: pt with several standing rest breaks due to low back pain;  otherwise proper use of RW with no LOB  Stairs Stairs:  (pt reports family assists her as needed)          Wheelchair Mobility    Modified Rankin (Stroke Patients Only)       Balance Overall balance assessment: Needs assistance         Standing balance support: No upper extremity supported Standing balance-Leahy Scale: Fair                               Pertinent Vitals/Pain Pain RLE 1/10 at rest, 2/10 with walking; pt was pre-medicated for pain; patient repositioned for comfort     Home Living Family/patient expects to be discharged to:: Private residence Living Arrangements: Spouse/significant other Available Help at Discharge: Family Type of Home: House Home Access: Stairs to enter Entrance Stairs-Rails: Left Entrance Stairs-Number of Steps: 3 Home Layout: One level Home Equipment: Walker - standard;Wheelchair - manual      Prior Function Level of Independence: Needs assistance   Gait / Transfers Assistance Needed: assist on steps; modified independent with walker           Hand Dominance   Dominant Hand: Right    Extremity/Trunk Assessment   Upper Extremity Assessment: Defer to OT evaluation           Lower Extremity Assessment: Generalized weakness;RLE deficits/detail RLE Deficits / Details: lower leg edematous with cellulitis (red, draining); dorsiflexion 2+/5; numbness in foot; knee 3+/5    Cervical / Trunk Assessment: Normal  Communication   Communication: No difficulties  Cognition Arousal/Alertness: Awake/alert Behavior During Therapy: WFL for tasks  assessed/performed Overall Cognitive Status: Within Functional Limits for tasks assessed                      General Comments      Exercises General Exercises - Lower Extremity Ankle Circles/Pumps: AROM;AAROM;5 reps;Right;Supine Quad Sets:  (pt verbalized how to do these and does them on her own) [employer]:  (pt verbalized how to do these and does them  on her own) Other Exercises Other Exercises: pt reports she was taught to use sheet around forefoot for dorsiflexion/calf stretch and has been doing this at home      Assessment/Plan    PT Assessment Patient needs continued PT services  PT Diagnosis Generalized weakness;Difficulty walking   PT Problem List Decreased strength;Decreased activity tolerance;Decreased balance;Decreased mobility;Decreased knowledge of use of DME  PT Treatment Interventions DME instruction;Gait training;Functional mobility training;Therapeutic activities;Therapeutic exercise;Balance training;Patient/family education   PT Goals (Current goals can be found in the Care Plan section) Acute Rehab PT Goals Patient Stated Goal: incr independence with balance and gait PT Goal Formulation: With patient Time For Goal Achievement: 11/13/13 Potential to Achieve Goals: Good    Frequency Min 3X/week   Barriers to discharge        Co-evaluation               End of Session   Activity Tolerance: Patient tolerated treatment well Patient left: in bed;with call bell/phone within reach (bed in trendelenburg to get feet higher than chest) Nurse Communication: Mobility status         Time: 9702-6378 PT Time Calculation (min): 22 min   Charges:   PT Evaluation $Initial PT Evaluation Tier I: 1 Procedure PT Treatments $Gait Training: 8-22 mins   PT G Codes:          Moris Ratchford 11-24-13, 3:41 PM Pager [phone]

## 2013-11-08 NOTE — Progress Notes (Signed)
ANTIBIOTIC/ANTICOAGULATION CONSULT NOTE - Follow Up Consult  Pharmacy Consult for Coumadin Indication: R arm DVT  Allergies  Allergen Reactions  . Crestor [Rosuvastatin] Other (See Comments)    Severe muscle weakness  . Nsaids Other (See Comments)    Not allergic, "bad on my kidneys"  . Ciprofloxacin Rash    Patient Measurements: Height: 5' 7.5" (171.5 cm) Weight: 255 lb 4.7 oz (115.8 kg) IBW/kg (Calculated) : 62.75  Vital Signs: Temp: 98.3 F (36.8 C) (07/23 1006) Temp src: Oral (07/23 1006) BP: 144/72 mmHg (07/23 1006) Pulse Rate: 84 (07/23 1006) Intake/Output from previous day: 07/22 0701 - 07/23 0700 In: 600 [P.O.:600] Out: 2495  Intake/Output from this shift: Total I/O In: 240 [P.O.:240] Out: -   Labs:  Recent Labs  11/06/13 1035 11/08/13 0513  WBC 5.8 5.4  HGB 8.2* 8.7*  PLT 453* 405*  CREATININE 5.04* 4.21*   Estimated Creatinine Clearance: 22.4 ml/min (by C-G formula based on Cr of 4.21).   Assessment: 43 y.o. female presents from HF clinic with RLE cellulitis. 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  During that hospitalization, she was on warfarin for R arm DVT. This was stopped [facility] 7/16 d/t supratherapeutic INR. Home dose was 2mg  daily. INR remained >10 despite no doses being given since 7/16. She received vitamin K 2.5mg  po x1 on 7/21 and 7/22. INR this morning 3.22. Hgb low but stable, plts high. No overt bleeding.  Goal of Therapy: INR 2-3 Monitor platelets by anticoagulation protocol: Yes  Plan:  1. Warfarin 0.5mg  po x1 tonight. Full effects of vitamin K dose given yesterday is still not fully seen in INR this morning. Given recent DVT, do not want patient's INR to become too low. Small dose tonight will hopefully keep INR in therapeutic range. 2. Daily PT/INR 3. CBC in the morning 4. Follow for s/s bleeding  Charisa Twitty D. Story Vanvranken,  PharmD, BCPS Clinical Pharmacist Pager: [phone] 11/08/2013 10:28 AM

## 2013-11-08 NOTE — Evaluation (Addendum)
Occupational Therapy Evaluation Patient Details Name: Hannah [name] MRN: [id number] DOB: 09-Aug-1970 Today's Date: 11/08/2013    History of Present Illness 43 year old recently discharged from prolonged  hospitalization with history of CHF, DM, ESRD, HTN admitted with right lower leg pain and edema, without evidence of DVT -treating as cellulitis   Clinical Impression   Patient presents with acute pain, decreased balance, and decreased activity tolerance which limit her overall independence with basic self care tasks.  Patient was independent with ADL/IADL prior to previous hospitalization.  Patient required intermittent assistance from husband since recent hospitalization.  Patient will benefit from skilled OT intervention to increase her independence and activity tolerance with her daily life skills.      Follow Up Recommendations       Equipment Recommendations       Recommendations for Other Services       Precautions / Restrictions Precautions Precautions: Fall Precaution Comments: decreased sensation right leg/foot, edema right lower leg      Mobility Bed Mobility Overal bed mobility: Modified Independent Bed Mobility: Sit to Supine       Sit to supine: Modified independent (Device/Increase time)   General bed mobility comments: able to get into bed and scoot to [facility] with use of rails without cues or assist  Transfers Overall transfer level: Needs assistance Equipment used: Rolling walker (2 wheeled) Transfers: Sit to/from Stand Sit to Stand: Supervision              Balance Overall balance assessment: Needs assistance         Standing balance support: No upper extremity supported Standing balance-Leahy Scale: Fair                              ADL                                               Vision                     Perception     Praxis      Pertinent Vitals/Pain 0/10 right leg, 1-4/10 right groin      Hand Dominance Right   Extremity/Trunk Assessment Upper Extremity Assessment Upper Extremity Assessment: Defer to OT evaluation   Lower Extremity Assessment Lower Extremity Assessment: Generalized weakness;RLE deficits/detail RLE Deficits / Details: lower leg edematous with cellulitis (red, draining); dorsiflexion 2+/5; numbness in foot; knee 3+/5 RLE Sensation: decreased light touch   Cervical / Trunk Assessment Cervical / Trunk Assessment: Normal   Communication Communication Communication: No difficulties   Cognition Arousal/Alertness: Awake/alert Behavior During Therapy: WFL for tasks assessed/performed Overall Cognitive Status: Within Functional Limits for tasks assessed                     General Comments       Exercises Exercises: General Lower Extremity;Other exercises Other Exercises Other Exercises: pt reports she was taught to use sheet around forefoot for dorsiflexion/calf stretch and has been doing this at home   Shoulder Instructions      Home Living Family/patient expects to be discharged to:: Private residence Living Arrangements: Spouse/significant other Available Help at Discharge: Family Type of Home: House Home Access: Stairs to enter [employer] of Steps: 3 Entrance Stairs-Rails: Left Home Layout: One level  Home Equipment: Walker - standard;Wheelchair - manual          Prior Functioning/Environment Level of Independence: Needs assistance  Gait / Transfers Assistance Needed: assist on steps; modified independent with walker          OT Diagnosis:     OT Problem List:     OT Treatment/Interventions:      OT Goals(Current goals can be found in the care plan section) Acute Rehab OT Goals Patient Stated Goal: incr independence with balance and gait  OT Frequency: Min 2X/week   Barriers to D/C:            Co-evaluation              End of Session    Activity Tolerance:    Patient left:     Time:  -    Charges:    G-Codes:    Hannah [name] Nov 25, 2013, 4:57 PM

## 2013-11-08 NOTE — Progress Notes (Signed)
Dressing changes completed by vascular team this afternoon.   Joellen Jersey, RN.

## 2013-11-08 NOTE — Progress Notes (Signed)
Subjective:  Doing well except leg- had HD yest removed 2500 Objective Vital signs in last 24 hours: Filed Vitals:   11/07/13 1815 11/07/13 1954 11/07/13 2131 11/08/13 0512  BP: 120/60 124/51 104/73 135/63  Pulse: 87 102 99 89  Temp:  97.8 F (36.6 C) 98.2 F (36.8 C) 98.5 F (36.9 C)  TempSrc:  Oral Oral Oral  Resp: 12 12 13 15   Height:      Weight:   115.8 kg (255 lb 4.7 oz)   SpO2:  98% 96% 97%   Weight change: 2.143 kg (4 lb 11.6 oz)  Intake/Output Summary (Last 24 hours) at 11/08/13 1004 Last data filed at 11/08/13 0513  Gross per 24 hour  Intake    360 ml  Output   2495 ml  Net  -2135 ml    Dialysis Orders: MWF @ [facility].  120kgs 2K/2.25Ca 4hr 5000 Heparin LIJcath 500/1.5  hectorol 2 mcg IV/HD Aranesp 150 q week Venofer 50q week    Assessment/Plan:  1. RLE cellulitis- vanc and zosyn per primary. Slight improvement. Dopplers negative for DVT. Blood cultures pending. VVS following.  2. supratherapeutic INR- >10- now down to 3.22. management per primary, Vit K given, coumadin on hold. 3. ESRD - MWF @ [facility], HD next tomorrow. K+4.1- via PC for now- plans for permanent access ongoing per vascular 4. HyPOtension/volume - 103/63, on midodine- amazing she was able to come off dobutamine- probably needs lower EDW, can work on while here 5. Anemia - hgb 8.7 cont Aranesp and Fe today 6. Metabolic bone disease - Ca+ 8.8. Cont hectorol 7. Nutrition - heart healthy/carb modified w fluid restriction. alb 2.6 8. DM- per primary 9. Perm access- eval pending with VVS 10.    Sanae Willetts A    Labs: Basic Metabolic Panel:  Recent Labs Lab 11/06/13 1035 11/08/13 0513  NA 137 136*  K 4.4 4.1  CL 90* 96  CO2 25 26  GLUCOSE 144* 154*  BUN 34* 21  CREATININE 5.04* 4.21*  CALCIUM 8.8 8.1*  PHOS  --  5.3*   Liver Function Tests:  Recent Labs Lab 11/06/13 1035 11/08/13 0513  AST 23  --   ALT 20  --   ALKPHOS 146*  --   BILITOT 0.3  --   PROT 6.2  --    ALBUMIN 2.6* 2.3*   No results found for this basename: LIPASE, AMYLASE,  in the last 168 hours No results found for this basename: AMMONIA,  in the last 168 hours CBC:  Recent Labs Lab 11/06/13 1035 11/08/13 0513  WBC 5.8 5.4  NEUTROABS 4.1  --   HGB 8.2* 8.7*  HCT 27.7* 29.2*  MCV 87.9 87.7  PLT 453* 405*   Cardiac Enzymes: No results found for this basename: CKTOTAL, CKMB, CKMBINDEX, TROPONINI,  in the last 168 hours CBG:  Recent Labs Lab 11/07/13 1844 11/07/13 1916 11/07/13 1951 11/07/13 2128 11/08/13 0744  GLUCAP 52* 54* 164* 235* 156*    Iron Studies: No results found for this basename: IRON, TIBC, TRANSFERRIN, FERRITIN,  in the last 72 hours Studies/Results: No results found. Medications: Infusions:    Scheduled Medications: . aspirin EC  81 mg Oral Daily  . atorvastatin  80 mg Oral q1800  . cholecalciferol  1,000 Units Oral Daily  . collagenase   Topical Daily  . darbepoetin (ARANESP) injection - DIALYSIS  150 mcg Intravenous Q Wed-HD  . doxercalciferol  2 mcg Intravenous Q M,W,F-HD  . ferric gluconate (FERRLECIT/NULECIT) IV  62.5 mg Intravenous Weekly  . insulin aspart  0-9 Units Subcutaneous TID WC  . insulin aspart  5 Units Subcutaneous TID WC  . insulin detemir  20 Units Subcutaneous QPM  . midodrine  10 mg Oral TID WC  . pantoprazole  40 mg Oral Daily  . piperacillin-tazobactam (ZOSYN)  IV  2.25 g Intravenous Q8H  . sodium chloride  3 mL Intravenous Q12H  . vancomycin  1,000 mg Intravenous Q M,W,F-HD  . Warfarin - Pharmacist Dosing Inpatient   Does not apply q1800    have reviewed scheduled and prn medications.  Physical Exam: General: NAD Heart: RRR Lungs: mostly clear Abdomen: soft, non tender Extremities: 2 plus edema- maybe a little less red Dialysis Access: PC    11/08/2013,10:04 AM  LOS: 2 days

## 2013-11-08 NOTE — Progress Notes (Signed)
CBG 54, pt drank 1 cup apple juice, CBG 64, pt drank another cup of apple juice, and then her supper tray arrived. Patient ate 100% of her supper. Dr. Karleen Hampshire notified with no new orders. Pt stated she felt ok with receiving her scheduled meal coverage insulin due to the amount of carbs she consumed at supper. Will continue to monitor.  Joellen Jersey, RN.

## 2013-11-08 NOTE — Progress Notes (Signed)
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 She was most recently hospitalized during June of 2015 for protracted period of time and was just discharged 8 days prior to this presentation. She had a complicated hospitalization and began with a heart catheterization with subsequent cardiogenic shock, IABP placement. Subsequently she developed ischemic lower extremity that required vascular surgery consultation and eventual femoral thromboembolectomy with 4 compartment fasciotomy to the right lower extremity. At time of discharge her groin and leg wounds were healing nicely and plans were to continue followup with the vascular surgeons. About 3-4 days ago she began having more than tenderness the right lower extremity with associated redness. She has also been having difficulties with supratherapeutic INRs with resultant changes in her Coumadin dosage including holding her Coumadin. Because of these ongoing symptoms with color change and pain she presented to the emergency department.  While in the emergency department she was also evaluated by vascular surgery. Because of necrotic tissue seen on initial exam the vascular surgeons sharply debrided these areas and repacked the wound. Other than that the vascular surgeon felt that her fasciotomy sites looked clean. She also had an abdominal incision that also required some minor debridement of necrotic tissue but otherwise also looked clean and recommendations were to continue IV antibiotics and followup with Dr. Trula Slade as previously scheduled.  HPI/Subjective: Itching over the lateral part of the arms, some redness seen.  Assessment/Plan:    Cellulitis of right lower extremity/recent right lower  extremity ischemia requiring 4 compartment fasciotomy -Appreciate vascular surgery assistance, currently on IV vancomycin and zosyn. 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 as opposed to active cellulitis noting patient had no fever and no leukocytosis at presentation and was not on antibiotic therapy prior to admission    Supratherapeutic INR -Was given 2.5 mg vitamin K at admission and her repeat INR today is 3.22    Chronic combined systolic and diastolic CHF, NYHA class 3 -Appears compensated at this time with primary management with dialysis treatments    Diabetic retinopathy associated with type 2 diabetes mellitus -Continue home dose Levemir with sliding scale insulin -Hemoglobin A1c in May was 8.0 -Current CBGs reasonably controlled  - CBG (last 3)   Recent Labs  11/07/13 2128 11/08/13 0744 11/08/13 1157  GLUCAP 235* 156* 180*        Venous insufficiency/recent DVT of axillary vein, acute right -DVT diagnosed June 4 -DVT and recent thromboembolectomy rationale for continued anticoagulation    Essential hypertension, benign -Appears resolved with initiation of dialysis treatments in the setting of chronic systolic heart failure/low output cardiac failure -Off antihypertensive medications and actually requires midodrine to support blood pressure      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 -Dialysis per nephrology    Stage I Adenocarcinoma of lung -Status post lobectomy by VATS April 2013 that did not require postresection chemotherapy    Gastroparesis/GERD  -Chronic and according to patient at baseline -Continue Protonix    Normocytic anemia -Hemoglobin stable and at baseline which is greater than 8.0  DVT prophylaxis: Supratherapeutic INR on warfarin Code Status: Full Family Communication: No family at bedside   Consultants: VVS  Procedures: Right lower extremity venous  duplex  Cultures: Blood cultures pending  Antibiotics: Zosyn  7/21 >>> Vancomycin 7/21 >>>  Objective: Blood pressure 144/72, pulse 84, temperature 98.3 F (36.8 C), temperature source Oral, resp. rate 16, height 5' 7.5" (1.715 m), weight 115.8 kg (255 lb 4.7 oz), SpO2 91.00%.  Intake/Output Summary (Last 24 hours) at 11/08/13 1544 Last data filed at 11/08/13 1300  Gross per 24 hour  Intake    960 ml  Output   2495 ml  Net  -1535 ml   Exam: General: No acute respiratory distress Lungs: Clear to auscultation bilaterally without wheezes or crackles Cardiovascular: Regular rate and rhythm without murmur gallop or rub normal S1 and S2, 1+ peripheral edema bilateral lower extremity or JVD Abdomen: Nontender, nondistended, soft, bowel sounds positive, no rebound, no ascites, no appreciable mass Musculoskeletal: No significant cyanosis, clubbing of bilateral lower extremities-note vivid red circumferential area of erythema involving the right lower ext just below the knee-known fasciotomy sites dressed and clean and dry-nontender with minimal palpation  Scheduled Meds:  Scheduled Meds: . aspirin EC  81 mg Oral Daily  . atorvastatin  80 mg Oral q1800  . cholecalciferol  1,000 Units Oral Daily  . collagenase   Topical Daily  . darbepoetin (ARANESP) injection - DIALYSIS  150 mcg Intravenous Q Wed-HD  . doxercalciferol  2 mcg Intravenous Q M,W,F-HD  . ferric gluconate (FERRLECIT/NULECIT) IV  62.5 mg Intravenous Weekly  . insulin aspart  0-9 Units Subcutaneous TID WC  . insulin aspart  5 Units Subcutaneous TID WC  . insulin detemir  20 Units Subcutaneous QPM  . midodrine  10 mg Oral TID WC  . pantoprazole  40 mg Oral Daily  . piperacillin-tazobactam (ZOSYN)  IV  2.25 g Intravenous Q8H  . sodium chloride  3 mL Intravenous Q12H  . vancomycin  1,000 mg Intravenous Q M,W,F-HD  . warfarin  0.5 mg Oral ONCE-1800  . Warfarin - Pharmacist Dosing Inpatient   Does not apply q1800   Data  Reviewed: Basic Metabolic Panel:  Recent Labs Lab 11/06/13 1035 11/08/13 0513  NA 137 136*  K 4.4 4.1  CL 90* 96  CO2 25 26  GLUCOSE 144* 154*  BUN 34* 21  CREATININE 5.04* 4.21*  CALCIUM 8.8 8.1*  PHOS  --  5.3*   Liver Function Tests:  Recent Labs Lab 11/06/13 1035 11/08/13 0513  AST 23  --   ALT 20  --   ALKPHOS 146*  --   BILITOT 0.3  --   PROT 6.2  --   ALBUMIN 2.6* 2.3*   CBC:  Recent Labs Lab 11/06/13 1035 11/08/13 0513  WBC 5.8 5.4  NEUTROABS 4.1  --   HGB 8.2* 8.7*  HCT 27.7* 29.2*  MCV 87.9 87.7  PLT 453* 405*   CBG:  Recent Labs Lab 11/07/13 1916 11/07/13 1951 11/07/13 2128 11/08/13 0744 11/08/13 1157  GLUCAP 54* 164* 235* 156* 180*    Recent Results (from the past 240 hour(s))  CULTURE, BLOOD (ROUTINE X 2)     Status: None   Collection Time    11/06/13 11:15 AM      Result Value Ref Range Status   Specimen Description BLOOD RIGHT ANTECUBITAL   Final   Special Requests BOTTLES DRAWN AEROBIC AND ANAEROBIC 5CC   Final   Culture  Setup Time     Final   Value: 11/06/2013 17:12     Performed at [facility]   Culture     Final   Value:        BLOOD CULTURE  RECEIVED NO GROWTH TO DATE CULTURE WILL BE HELD FOR 5 DAYS BEFORE ISSUING A FINAL NEGATIVE REPORT     Performed at [facility]   Report Status PENDING   Incomplete  CULTURE, BLOOD (ROUTINE X 2)     Status: None   Collection Time    11/06/13 11:30 AM      Result Value Ref Range Status   Specimen Description BLOOD RIGHT FOREARM   Final   Special Requests BOTTLES DRAWN AEROBIC AND ANAEROBIC 4CC   Final   Culture  Setup Time     Final   Value: 11/06/2013 17:12     Performed at [facility]   Culture     Final   Value:        BLOOD CULTURE RECEIVED NO GROWTH TO DATE CULTURE WILL BE HELD FOR 5 DAYS BEFORE ISSUING A FINAL NEGATIVE REPORT     Performed at [facility]   Report Status PENDING   Incomplete  MRSA PCR SCREENING     Status: None    Collection Time    11/06/13  7:45 PM      Result Value Ref Range Status   MRSA by PCR NEGATIVE  NEGATIVE Final   Comment:            The GeneXpert MRSA Assay (FDA     approved for NASAL specimens     only), is one component of a     comprehensive MRSA colonization     surveillance program. It is not     intended to diagnose MRSA     infection nor to guide or     monitor treatment for     MRSA infections.      Hosie Poisson, MD [phone]   On-Call/Text Page:      [email or web address]      password [email or web address]  If 7PM-7AM, please contact night-coverage [email or web address] Password [facility] 11/08/2013, 3:44 PM   LOS: 2 days

## 2013-11-08 NOTE — Plan of Care (Signed)
Problem: Phase III Progression Outcomes Goal: Pain controlled on oral analgesia Outcome: Completed/Met Date Met:  11/08/13 Pain gets IV pain meds prior to dressing changes only.

## 2013-11-08 NOTE — Progress Notes (Addendum)
[facility]  Subjective  - Doing well over all.  Her right leg is still red and feels tight.  She has sensation and movement of her toes.   Objective 135/63 89 98.5 F (36.9 C) (Oral) 15 97%  Intake/Output Summary (Last 24 hours) at 11/08/13 0754 Last data filed at 11/08/13 0513  Gross per 24 hour  Intake    600 ml  Output   2495 ml  Net  -1895 ml    Right leg below knee erythema and warmth.  No change in cellulitis. AROM of toes intact.  Moderate edema.   Dressing clean right medial and lateral fasciotomy site and groin.  Assessment/Planning: Cellulitis right LE-IV antibiotics Vanc/Zosyn Supratherapeutic INR now 3.22  Patient needs to be pre-medicated for dressing changes I spoke with her nurse and she will page me once she is set up for dressing changes later today. Right lower extremity edema-I placed the right foot and ankle above the level of the patients heart to demonstrate proper elevation.  She tolerated this well.   ESRD dialysis M-W-F   Laurence Slate [facility] 11/08/2013 7:54 AM --  Laboratory Lab Results:  Recent Labs  11/06/13 1035 11/08/13 0513  WBC 5.8 5.4  HGB 8.2* 8.7*  HCT 27.7* 29.2*  PLT 453* 405*   BMET  Recent Labs  11/06/13 1035 11/08/13 0513  NA 137 136*  K 4.4 4.1  CL 90* 96  CO2 25 26  GLUCOSE 144* 154*  BUN 34* 21  CREATININE 5.04* 4.21*  CALCIUM 8.8 8.1*    COAG Lab Results  Component Value Date   INR 3.22* 11/08/2013   INR >10.00* 11/07/2013   INR >10.00* 11/06/2013   No results found for this basename: PTT    Agree with above. Slight improvement in her right LE cellulitis.   Deitra Mayo, MD, [place] [phone] 11/08/2013

## 2013-11-09 LAB — CBC
HEMATOCRIT: 27.9 % — AB (ref 36.0–46.0)
Hemoglobin: 8.4 g/dL — ABNORMAL LOW (ref 12.0–15.0)
MCH: 26.6 pg (ref 26.0–34.0)
MCHC: 30.1 g/dL (ref 30.0–36.0)
MCV: 88.3 fL (ref 78.0–100.0)
PLATELETS: 380 10*3/uL (ref 150–400)
RBC: 3.16 MIL/uL — ABNORMAL LOW (ref 3.87–5.11)
RDW: 17.4 % — AB (ref 11.5–15.5)
WBC: 5 10*3/uL (ref 4.0–10.5)

## 2013-11-09 LAB — GLUCOSE, CAPILLARY
GLUCOSE-CAPILLARY: 131 mg/dL — AB (ref 70–99)
GLUCOSE-CAPILLARY: 150 mg/dL — AB (ref 70–99)
Glucose-Capillary: 180 mg/dL — ABNORMAL HIGH (ref 70–99)
Glucose-Capillary: 95 mg/dL (ref 70–99)

## 2013-11-09 LAB — RENAL FUNCTION PANEL
ALBUMIN: 2.1 g/dL — AB (ref 3.5–5.2)
Anion gap: 17 — ABNORMAL HIGH (ref 5–15)
BUN: 32 mg/dL — ABNORMAL HIGH (ref 6–23)
CO2: 25 mEq/L (ref 19–32)
CREATININE: 5.76 mg/dL — AB (ref 0.50–1.10)
Calcium: 8.3 mg/dL — ABNORMAL LOW (ref 8.4–10.5)
Chloride: 90 mEq/L — ABNORMAL LOW (ref 96–112)
GFR calc Af Amer: 9 mL/min — ABNORMAL LOW (ref >90)
GFR, EST NON AFRICAN AMERICAN: 8 mL/min — AB (ref >90)
Glucose, Bld: 123 mg/dL — ABNORMAL HIGH (ref 70–99)
Phosphorus: 7.3 mg/dL — ABNORMAL HIGH (ref 2.3–4.6)
Potassium: 4.4 mEq/L (ref 3.7–5.3)
Sodium: 132 mEq/L — ABNORMAL LOW (ref 137–147)

## 2013-11-09 LAB — PROTIME-INR
INR: 2.4 — AB (ref 0.00–1.49)
Prothrombin Time: 26.2 seconds — ABNORMAL HIGH (ref 11.6–15.2)

## 2013-11-09 LAB — HEPATITIS B SURFACE ANTIBODY,QUALITATIVE: Hep B S Ab: POSITIVE — AB

## 2013-11-09 LAB — HEPATITIS B CORE ANTIBODY, TOTAL: Hep B Core Total Ab: NONREACTIVE

## 2013-11-09 LAB — HEPATITIS B SURFACE ANTIGEN: Hepatitis B Surface Ag: NEGATIVE

## 2013-11-09 MED ORDER — HEPARIN SODIUM (PORCINE) 1000 UNIT/ML DIALYSIS
20.0000 [IU]/kg | INTRAMUSCULAR | Status: DC | PRN
  Administered 2013-11-09: 2300 [IU] via INTRAVENOUS_CENTRAL

## 2013-11-09 MED ORDER — MIDODRINE HCL 5 MG PO TABS
ORAL_TABLET | ORAL | Status: AC
  Administered 2013-11-09: 10 mg via ORAL
  Filled 2013-11-09: qty 2

## 2013-11-09 MED ORDER — CALCIUM ACETATE 667 MG PO CAPS
1334.0000 mg | ORAL_CAPSULE | Freq: Three times a day (TID) | ORAL | Status: DC
  Administered 2013-11-09 – 2013-11-10 (×3): 1334 mg via ORAL
  Filled 2013-11-09 (×7): qty 2

## 2013-11-09 MED ORDER — WARFARIN 0.5 MG HALF TABLET
0.5000 mg | ORAL_TABLET | Freq: Once | ORAL | Status: AC
  Administered 2013-11-09: 0.5 mg via ORAL
  Filled 2013-11-09: qty 1

## 2013-11-09 MED ORDER — ALTEPLASE 2 MG IJ SOLR
2.0000 mg | Freq: Once | INTRAMUSCULAR | Status: DC | PRN
  Filled 2013-11-09: qty 2

## 2013-11-09 MED ORDER — OXYCODONE-ACETAMINOPHEN 5-325 MG PO TABS
ORAL_TABLET | ORAL | Status: AC
  Administered 2013-11-09: 2 via ORAL
  Filled 2013-11-09: qty 2

## 2013-11-09 MED ORDER — SODIUM CHLORIDE 0.9 % IV SOLN: 100.0000 mL | INTRAVENOUS | Status: DC | PRN

## 2013-11-09 MED ORDER — LIDOCAINE HCL (PF) 1 % IJ SOLN
5.0000 mL | INTRAMUSCULAR | Status: DC | PRN
Start: 2013-11-09 — End: 2013-11-09

## 2013-11-09 MED ORDER — LIDOCAINE-PRILOCAINE 2.5-2.5 % EX CREA
1.0000 "application " | TOPICAL_CREAM | CUTANEOUS | Status: DC | PRN
  Filled 2013-11-09: qty 5

## 2013-11-09 MED ORDER — HEPARIN SODIUM (PORCINE) 1000 UNIT/ML DIALYSIS: 1000.0000 [IU] | INTRAMUSCULAR | Status: DC | PRN

## 2013-11-09 MED ORDER — PENTAFLUOROPROP-TETRAFLUOROETH EX AERO
1.0000 | INHALATION_SPRAY | CUTANEOUS | Status: DC | PRN
Start: 2013-11-09 — End: 2013-11-09

## 2013-11-09 MED ORDER — DOXERCALCIFEROL 4 MCG/2ML IV SOLN
INTRAVENOUS | Status: AC
  Administered 2013-11-09: 2 ug via INTRAVENOUS
  Filled 2013-11-09: qty 2

## 2013-11-09 MED ORDER — NEPRO/CARBSTEADY PO LIQD
237.0000 mL | ORAL | Status: DC | PRN
  Filled 2013-11-09: qty 237

## 2013-11-09 NOTE — Progress Notes (Signed)
ANTIBIOTIC/ANTICOAGULATION CONSULT NOTE - Follow Up Consult  Pharmacy Consult for Coumadin Indication: R arm DVT  Allergies  Allergen Reactions  . Crestor [Rosuvastatin] Other (See Comments)    Severe muscle weakness  . Nsaids Other (See Comments)    Not allergic, "bad on my kidneys"  . Ciprofloxacin Rash    Patient Measurements: Height: 5' 7.5" (171.5 cm) Weight: 270 lb 8.1 oz (122.7 kg) IBW/kg (Calculated) : 62.75  Vital Signs: Temp: 97.8 F (36.6 C) (07/24 0746) Temp src: Oral (07/24 0746) BP: 138/62 mmHg (07/24 0756) Pulse Rate: 93 (07/24 0756) Intake/Output from previous day: 07/23 0701 - 07/24 0700 In: 1790 [P.O.:1440; IV Piggyback:350] Out: -  Intake/Output from this shift:    Labs:  Recent Labs  11/06/13 1035 11/08/13 0513  WBC 5.8 5.4  HGB 8.2* 8.7*  PLT 453* 405*  CREATININE 5.04* 4.21*   Estimated Creatinine Clearance: 23.1 ml/min (by C-G formula based on Cr of 4.21).  Assessment: 43 y.o. female presents from HF clinic with RLE cellulitis. 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  During that hospitalization, she was on warfarin for R arm DVT. This was stopped [facility] 7/16 d/t supratherapeutic INR. Home dose was 2mg  daily. INR remained >10 despite no doses being given since 7/16. She received vitamin K 2.5mg  po x1 on 7/21 and 7/22. Warfarin was resumed last evening- she received 0.5mg . INR this morning 2.4. Hgb low but stable, platelets now WNL. No bleeding noted.  Goal of Therapy: INR 2-3 Monitor platelets by anticoagulation protocol: Yes  Plan:  1. Warfarin 0.5mg  po x1 tonight. 2. Daily PT/INR 3. Follow for s/s bleeding  [name] D. Ingra Rother, PharmD, BCPS Clinical Pharmacist Pager: [phone] 11/09/2013 8:03 AM

## 2013-11-09 NOTE — Progress Notes (Signed)
Subjective:  Doing well except leg- now may need wound vac seen on HD- tolerating fairly aggressive UF Objective Vital signs in last 24 hours: Filed Vitals:   11/09/13 0835 11/09/13 0900 11/09/13 0930 11/09/13 1000  BP: 142/76 118/69 110/81 112/63  Pulse: 94 88 86 87  Temp:      TempSrc:      Resp: 20 18 16 18   Height:      Weight:      SpO2:       Weight change: -7 kg (-15 lb 6.9 oz)  Intake/Output Summary (Last 24 hours) at 11/09/13 1019 Last data filed at 11/09/13 0600  Gross per 24 hour  Intake   1550 ml  Output      0 ml  Net   1550 ml    Dialysis Orders: MWF @ [facility].  120kgs 2K/2.25Ca 4hr 5000 Heparin LIJcath 500/1.5  hectorol 2 mcg IV/HD Aranesp 150 q week Venofer 50q week    Assessment/Plan:  1. RLE cellulitis- vanc and zosyn per primary. Moderate improvement. Dopplers negative for DVT. Blood cultures pending. VVS following. Now saying she may need wound vac on groin 2. supratherapeutic INR- >10- now down to 2.4. management per primary, Vit K given, coumadin per pharmacy 3. ESRD - MWF @ [facility] via PC, HD today. K+4.4- via PC for now- plans for permanent access ongoing per vascular 4. HyPOtension/volume - BP pretty good, on midodine- amazing she was able to come off dobutamine- probably needs lower EDW, can work on while here 5. Anemia - hgb 8.4 cont Aranesp and Fe  6. Metabolic bone disease - Ca+ 8.8. Cont hectorol- phos 7.3- pt says that she was taking rcaltrol at home and no binder- I told her she does not need to take vitamin D and will start phoslo which should be continued at discharge 7. Nutrition - heart healthy/carb modified w fluid restriction. alb 2.6 8. DM- per primary 9. Perm access- eval pending with VVS 10.    Hannah [name] A    Labs: Basic Metabolic Panel:  Recent Labs Lab 11/06/13 1035 11/08/13 0513 11/09/13 0758  NA 137 136* 132*  K 4.4 4.1 4.4  CL 90* 96 90*  CO2 25 26 25   GLUCOSE 144* 154* 123*  BUN 34* 21 32*   CREATININE 5.04* 4.21* 5.76*  CALCIUM 8.8 8.1* 8.3*  PHOS  --  5.3* 7.3*   Liver Function Tests:  Recent Labs Lab 11/06/13 1035 11/08/13 0513 11/09/13 0758  AST 23  --   --   ALT 20  --   --   ALKPHOS 146*  --   --   BILITOT 0.3  --   --   PROT 6.2  --   --   ALBUMIN 2.6* 2.3* 2.1*   No results found for this basename: LIPASE, AMYLASE,  in the last 168 hours No results found for this basename: AMMONIA,  in the last 168 hours CBC:  Recent Labs Lab 11/06/13 1035 11/08/13 0513 11/09/13 0758  WBC 5.8 5.4 5.0  NEUTROABS 4.1  --   --   HGB 8.2* 8.7* 8.4*  HCT 27.7* 29.2* 27.9*  MCV 87.9 87.7 88.3  PLT 453* 405* 380   Cardiac Enzymes: No results found for this basename: CKTOTAL, CKMB, CKMBINDEX, TROPONINI,  in the last 168 hours CBG:  Recent Labs Lab 11/08/13 1157 11/08/13 1650 11/08/13 1716 11/08/13 2101 11/09/13 0738  GLUCAP 180* 54* 64* 111* 131*    Iron Studies: No results found for this basename:  IRON, TIBC, TRANSFERRIN, FERRITIN,  in the last 72 hours Studies/Results: No results found. Medications: Infusions:    Scheduled Medications: . aspirin EC  81 mg Oral Daily  . atorvastatin  80 mg Oral q1800  . cholecalciferol  1,000 Units Oral Daily  . collagenase   Topical Daily  . darbepoetin (ARANESP) injection - DIALYSIS  150 mcg Intravenous Q Wed-HD  . doxercalciferol  2 mcg Intravenous Q M,W,F-HD  . ferric gluconate (FERRLECIT/NULECIT) IV  62.5 mg Intravenous Weekly  . insulin aspart  0-9 Units Subcutaneous TID WC  . insulin aspart  5 Units Subcutaneous TID WC  . insulin detemir  20 Units Subcutaneous QHS  . midodrine  10 mg Oral TID WC  . pantoprazole  40 mg Oral Daily  . piperacillin-tazobactam (ZOSYN)  IV  2.25 g Intravenous Q8H  . sodium chloride  3 mL Intravenous Q12H  . vancomycin  1,000 mg Intravenous Q M,W,F-HD  . warfarin  0.5 mg Oral ONCE-1800  . Warfarin - Pharmacist Dosing Inpatient   Does not apply q1800    have reviewed scheduled  and prn medications.  Physical Exam: General: NAD Heart: RRR Lungs: mostly clear Abdomen: soft, non tender Extremities: 2 plus edema- maybe a little less red Dialysis Access: PC    11/09/2013,10:19 AM  LOS: 3 days

## 2013-11-09 NOTE — Progress Notes (Signed)
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 She was most recently hospitalized during June of 2015 for protracted period of time and was just discharged 8 days prior to this presentation. She had a complicated hospitalization and began with a heart catheterization with subsequent cardiogenic shock, IABP placement. Subsequently she developed ischemic lower extremity that required vascular surgery consultation and eventual femoral thromboembolectomy with 4 compartment fasciotomy to the right lower extremity. At time of discharge her groin and leg wounds were healing nicely and plans were to continue followup with the vascular surgeons. About 3-4 days ago she began having more than tenderness the right lower extremity with associated redness. She has also been having difficulties with supratherapeutic INRs with resultant changes in her Coumadin dosage including holding her Coumadin. Because of these ongoing symptoms with color change and pain she presented to the emergency department.  While in the emergency department she was also evaluated by vascular surgery. Because of necrotic tissue seen on initial exam the vascular surgeons sharply debrided these areas and repacked the wound. Other than that the vascular surgeon felt that her fasciotomy sites looked clean. She also had an abdominal incision that also required some minor debridement of necrotic tissue but otherwise also looked clean and recommendations were to continue IV antibiotics and followup with Dr. Trula Slade as previously scheduled.  HPI/Subjective: Itching over the lateral part of the arms, some redness seen. Improved itching today.  Assessment/Plan:    Cellulitis of right lower  extremity/recent right lower extremity ischemia requiring 4 compartment fasciotomy -Appreciate vascular surgery assistance, currently on IV vancomycin and zosyn day 5.. 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 as opposed to active cellulitis noting patient had no fever and no leukocytosis at presentation and was not on antibiotic therapy prior to admission    Supratherapeutic INR -Was given 2.5 mg vitamin K at admission and her repeat INR today is 2.4    Chronic combined systolic and diastolic CHF, NYHA class 3 -Appears compensated at this time with primary management with dialysis treatments    Diabetic retinopathy associated with type 2 diabetes mellitus -Continue home dose Levemir with sliding scale insulin -Hemoglobin A1c in May was 8.0 -Current CBGs reasonably controlled  - CBG (last 3)   Recent Labs  11/08/13 2101 11/09/13 0738 11/09/13 1232  GLUCAP 111* 131* 150*        Venous insufficiency/recent DVT of axillary vein, acute right -DVT diagnosed June 4 -DVT and recent thromboembolectomy rationale for continued anticoagulation    Essential hypertension, benign -Appears resolved with initiation of dialysis treatments in the setting of chronic systolic heart failure/low output cardiac failure -Off antihypertensive medications and actually requires midodrine to support blood pressure      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 -Dialysis per nephrology    Stage I Adenocarcinoma of lung -Status post lobectomy by VATS April 2013 that did not require postresection chemotherapy    Gastroparesis/GERD  -Chronic and according to patient at baseline -Continue Protonix    Normocytic anemia -Hemoglobin stable and at baseline which is greater than 8.0  DVT prophylaxis: Supratherapeutic INR on warfarin Code Status: Full Family Communication: No family at  bedside   Consultants: VVS  Procedures: Right lower extremity venous duplex  Cultures: Blood  cultures pending  Antibiotics: Zosyn 7/21 >>> Vancomycin 7/21 >>>  Objective: Blood pressure 102/43, pulse 102, temperature 98.1 F (36.7 C), temperature source Oral, resp. rate 19, height 5' 7.5" (1.715 m), weight 118.7 kg (261 lb 11 oz), SpO2 98.00%.  Intake/Output Summary (Last 24 hours) at 11/09/13 1554 Last data filed at 11/09/13 1216  Gross per 24 hour  Intake   1190 ml  Output   3174 ml  Net  -1984 ml   Exam: General: No acute respiratory distress Lungs: Clear to auscultation bilaterally without wheezes or crackles Cardiovascular: Regular rate and rhythm without murmur gallop or rub normal S1 and S2, 1+ peripheral edema bilateral lower extremity or JVD Abdomen: Nontender, nondistended, soft, bowel sounds positive, no rebound, no ascites, no appreciable mass Musculoskeletal: No significant cyanosis, clubbing of bilateral lower extremities-note vivid red circumferential area of erythema involving the right lower ext just below the knee-known fasciotomy sites dressed and clean and dry-nontender with minimal palpation  Scheduled Meds:  Scheduled Meds: . aspirin EC  81 mg Oral Daily  . atorvastatin  80 mg Oral q1800  . calcium acetate  1,334 mg Oral TID WC  . collagenase   Topical Daily  . darbepoetin (ARANESP) injection - DIALYSIS  150 mcg Intravenous Q Wed-HD  . doxercalciferol  2 mcg Intravenous Q M,W,F-HD  . ferric gluconate (FERRLECIT/NULECIT) IV  62.5 mg Intravenous Weekly  . insulin aspart  0-9 Units Subcutaneous TID WC  . insulin aspart  5 Units Subcutaneous TID WC  . insulin detemir  20 Units Subcutaneous QHS  . midodrine  10 mg Oral TID WC  . pantoprazole  40 mg Oral Daily  . piperacillin-tazobactam (ZOSYN)  IV  2.25 g Intravenous Q8H  . sodium chloride  3 mL Intravenous Q12H  . vancomycin  1,000 mg Intravenous Q M,W,F-HD  . warfarin  0.5 mg Oral ONCE-1800  .  Warfarin - Pharmacist Dosing Inpatient   Does not apply q1800   Data Reviewed: Basic Metabolic Panel:  Recent Labs Lab 11/06/13 1035 11/08/13 0513 11/09/13 0758  NA 137 136* 132*  K 4.4 4.1 4.4  CL 90* 96 90*  CO2 25 26 25   GLUCOSE 144* 154* 123*  BUN 34* 21 32*  CREATININE 5.04* 4.21* 5.76*  CALCIUM 8.8 8.1* 8.3*  PHOS  --  5.3* 7.3*   Liver Function Tests:  Recent Labs Lab 11/06/13 1035 11/08/13 0513 11/09/13 0758  AST 23  --   --   ALT 20  --   --   ALKPHOS 146*  --   --   BILITOT 0.3  --   --   PROT 6.2  --   --   ALBUMIN 2.6* 2.3* 2.1*   CBC:  Recent Labs Lab 11/06/13 1035 11/08/13 0513 11/09/13 0758  WBC 5.8 5.4 5.0  NEUTROABS 4.1  --   --   HGB 8.2* 8.7* 8.4*  HCT 27.7* 29.2* 27.9*  MCV 87.9 87.7 88.3  PLT 453* 405* 380   CBG:  Recent Labs Lab 11/08/13 1650 11/08/13 1716 11/08/13 2101 11/09/13 0738 11/09/13 1232  GLUCAP 54* 64* 111* 131* 150*    Recent Results (from the past 240 hour(s))  CULTURE, BLOOD (ROUTINE X 2)     Status: None   Collection Time    11/06/13 11:15 AM      Result Value Ref Range Status   Specimen Description BLOOD RIGHT ANTECUBITAL   Final   Special Requests BOTTLES DRAWN AEROBIC AND ANAEROBIC 5CC   Final  Culture  Setup Time     Final   Value: 11/06/2013 17:12     Performed at [facility]   Culture     Final   Value:        BLOOD CULTURE RECEIVED NO GROWTH TO DATE CULTURE WILL BE HELD FOR 5 DAYS BEFORE ISSUING A FINAL NEGATIVE REPORT     Performed at [facility]   Report Status PENDING   Incomplete  CULTURE, BLOOD (ROUTINE X 2)     Status: None   Collection Time    11/06/13 11:30 AM      Result Value Ref Range Status   Specimen Description BLOOD RIGHT FOREARM   Final   Special Requests BOTTLES DRAWN AEROBIC AND ANAEROBIC 4CC   Final   Culture  Setup Time     Final   Value: 11/06/2013 17:12     Performed at [facility]   Culture     Final   Value:        BLOOD CULTURE RECEIVED  NO GROWTH TO DATE CULTURE WILL BE HELD FOR 5 DAYS BEFORE ISSUING A FINAL NEGATIVE REPORT     Performed at [facility]   Report Status PENDING   Incomplete  MRSA PCR SCREENING     Status: None   Collection Time    11/06/13  7:45 PM      Result Value Ref Range Status   MRSA by PCR NEGATIVE  NEGATIVE Final   Comment:            The GeneXpert MRSA Assay (FDA     approved for NASAL specimens     only), is one component of a     comprehensive MRSA colonization     surveillance program. It is not     intended to diagnose MRSA     infection nor to guide or     monitor treatment for     MRSA infections.      Hosie Poisson, MD [phone]   On-Call/Text Page:      [email or web address]      password [email or web address]  If 7PM-7AM, please contact night-coverage [email or web address] Password [facility] 11/09/2013, 3:54 PM   LOS: 3 days

## 2013-11-09 NOTE — Procedures (Signed)
Patient was seen on dialysis and the procedure was supervised.  BFR 400  Via PC BP is  112/63.   Patient appears to be tolerating treatment well  Signe Tackitt A 11/09/2013

## 2013-11-09 NOTE — Progress Notes (Signed)
ANTIBIOTIC CONSULT NOTE- Follow Up [place] for Vancomycin and Zosyn Indication: cellulitis  Allergies  Allergen Reactions  . Crestor [Rosuvastatin] Other (See Comments)    Severe muscle weakness  . Nsaids Other (See Comments)    Not allergic, "bad on my kidneys"  . Ciprofloxacin Rash    Patient Measurements: Height: 5' 7.5" (171.5 cm) Weight: 270 lb 8.1 oz (122.7 kg) IBW/kg (Calculated) : 62.75  Vital Signs: Temp: 97.8 F (36.6 C) (07/24 0746) Temp src: Oral (07/24 0746) BP: 138/62 mmHg (07/24 0756) Pulse Rate: 93 (07/24 0756) Intake/Output from previous day: 07/23 0701 - 07/24 0700 In: 1790 [P.O.:1440; IV Piggyback:350] Out: -  Intake/Output from this shift:    Labs:  Recent Labs  11/06/13 1035 11/08/13 0513  WBC 5.8 5.4  HGB 8.2* 8.7*  PLT 453* 405*  CREATININE 5.04* 4.21*   Estimated Creatinine Clearance: 23.1 ml/min (by C-G formula based on Cr of 4.21). No results found for this basename: VANCOTROUGH, Corlis Leak, VANCORANDOM, GENTTROUGH, GENTPEAK, GENTRANDOM, TOBRATROUGH, TOBRAPEAK, TOBRARND, AMIKACINPEAK, AMIKACINTROU, AMIKACIN,  in the last 72 hours   Microbiology: Recent Results (from the past 720 hour(s))  CLOSTRIDIUM DIFFICILE BY PCR     Status: None   Collection Time    10/13/13  4:01 AM      Result Value Ref Range Status   C difficile by pcr NEGATIVE  NEGATIVE Final  CULTURE, BLOOD (ROUTINE X 2)     Status: None   Collection Time    11/06/13 11:15 AM      Result Value Ref Range Status   Specimen Description BLOOD RIGHT ANTECUBITAL   Final   Special Requests BOTTLES DRAWN AEROBIC AND ANAEROBIC 5CC   Final   Culture  Setup Time     Final   Value: 11/06/2013 17:12     Performed at [facility]   Culture     Final   Value:        BLOOD CULTURE RECEIVED NO GROWTH TO DATE CULTURE WILL BE HELD FOR 5 DAYS BEFORE ISSUING A FINAL NEGATIVE REPORT     Performed at [facility]   Report Status PENDING   Incomplete   CULTURE, BLOOD (ROUTINE X 2)     Status: None   Collection Time    11/06/13 11:30 AM      Result Value Ref Range Status   Specimen Description BLOOD RIGHT FOREARM   Final   Special Requests BOTTLES DRAWN AEROBIC AND ANAEROBIC 4CC   Final   Culture  Setup Time     Final   Value: 11/06/2013 17:12     Performed at [facility]   Culture     Final   Value:        BLOOD CULTURE RECEIVED NO GROWTH TO DATE CULTURE WILL BE HELD FOR 5 DAYS BEFORE ISSUING A FINAL NEGATIVE REPORT     Performed at [facility]   Report Status PENDING   Incomplete  MRSA PCR SCREENING     Status: None   Collection Time    11/06/13  7:45 PM      Result Value Ref Range Status   MRSA by PCR NEGATIVE  NEGATIVE Final   Comment:            The GeneXpert MRSA Assay (FDA     approved for NASAL specimens     only), is one component of a     comprehensive MRSA colonization     surveillance program. It  is not     intended to diagnose MRSA     infection nor to guide or     monitor treatment for     MRSA infections.   Assessment: 43 y.o. female presents from HF clinic with RLE cellulitis. Patient had admission in June where she developed ischemic R foot from IABP which required femoral thromboembolectomy and fasciotomy of RLE. She received broad abx during this hospitalization and required many debridements. She is a newer dialysis patient and appears to be tolerating M/W/F schedule. Last HD session was 7/22- 3.5h with BFR of 422mL/min.  WBC normal, afebrile. There is no growth on cultures.  Goal of Therapy:  Pre HD vanc level 15-25 mcg/ml INR 2-3  Plan:  1. Vancomycin 1g IV post HD on M/W/F 2. Zosyn 2.25g IV q8h 3. Will f/u HD schedule, micro data, pt's clinical condition, LOT 4. Pre HD Vanc level at [facility]- 4th dose is 7/26  Sloan Takagi D. Caetano Oberhaus, PharmD, BCPS Clinical Pharmacist Pager: [phone] 11/09/2013 8:12 AM

## 2013-11-09 NOTE — Progress Notes (Addendum)
   Subjective  - No new complaints.  On HD today.  Objective 138/62 93 97.8 F (36.6 C) (Oral) 20 94%  Intake/Output Summary (Last 24 hours) at 11/09/13 0804 Last data filed at 11/09/13 0600  Gross per 24 hour  Intake   1790 ml  Output      0 ml  Net   1790 ml   Right lower leg with erythema and moderate edema, has shown some improvement since hospitalization. AROM toes intact Dressings in place right groin and med/Lat lower leg  Assessment/Planning: Cellulitis right LE-IV antibiotics Vanc/Zosyn WBC 11/08/2013 5.4 Supratherapeutic INR now 2.4 ESRD dialysis M-W-F  We will call the nurse prior to dressing changes today for pain medication. She may benefit from a wound vac on the right groin wound.  Laurence Slate [facility] 11/09/2013 8:04 AM  COAG Lab Results  Component Value Date   INR 2.40* 11/09/2013   INR 3.22* 11/08/2013   INR >10.00* 11/07/2013   Agree with above. From our standpoint, she could be converted to po antibiotics and Dr. Trula Slade can follow the wounds as an outpatient. The right groin wound is still not ready for VAC. We will check back Monday if she still here. Otherwise we will arrange for follow up as an outpatient.  Deitra Mayo, MD, [place] [phone] 11/09/2013

## 2013-11-10 LAB — GLUCOSE, CAPILLARY
GLUCOSE-CAPILLARY: 110 mg/dL — AB (ref 70–99)
GLUCOSE-CAPILLARY: 93 mg/dL (ref 70–99)

## 2013-11-10 LAB — PROTIME-INR
INR: 2.39 — ABNORMAL HIGH (ref 0.00–1.49)
Prothrombin Time: 26.1 seconds — ABNORMAL HIGH (ref 11.6–15.2)

## 2013-11-10 MED ORDER — CALCIUM ACETATE 667 MG PO CAPS: 1334.0000 mg | ORAL_CAPSULE | Freq: Three times a day (TID) | ORAL | Status: DC

## 2013-11-10 MED ORDER — WARFARIN 0.5 MG HALF TABLET
0.5000 mg | ORAL_TABLET | Freq: Once | ORAL | Status: AC
  Administered 2013-11-10: 0.5 mg via ORAL
  Filled 2013-11-10 (×2): qty 1

## 2013-11-10 MED ORDER — OXYCODONE-ACETAMINOPHEN 5-325 MG PO TABS: 1.0000 | ORAL_TABLET | Freq: Three times a day (TID) | ORAL | Status: DC | PRN

## 2013-11-10 MED ORDER — CLINDAMYCIN HCL 300 MG PO CAPS: 300.0000 mg | ORAL_CAPSULE | Freq: Three times a day (TID) | ORAL | Status: DC

## 2013-11-10 NOTE — Progress Notes (Signed)
Subjective:   Cellulitis much improved. Going home today  Objective Filed Vitals:   11/09/13 1643 11/09/13 2004 11/10/13 0514 11/10/13 0920  BP: 89/53 131/91 92/64 99/62   Pulse: 91 100 94 99  Temp: 98.2 F (36.8 C) 97.9 F (36.6 C) 98.1 F (36.7 C) 98.1 F (36.7 C)  TempSrc: Oral   Oral  Resp: 18 17 18 19   Height:      Weight:  123.469 kg (272 lb 3.2 oz)    SpO2: 98% 98% 98% 99%   Physical Exam General: alert and oriented. No acute distress Heart: RRR no murmur Lungs: CTA, unlabored Abdomen: obese. Soft non tender  Extremities: RLE 1+ edema. Redness improved from admission Dialysis Access: cath  Dialysis Orders: MWF @ [place].  120kgs 2K/2.25Ca 4hr 5000 Heparin LIJcath 500/1.5  hectorol 2 mcg IV/HD Aranesp 150 q week Venofer 50q week   Assessment/Plan:  1. RLE cellulitis- vanc and zosyn per primary- can give IV antibx if needed outpt.  Dopplers negative for DVT. Blood cultures no growth to date. VVS following. Now saying she may need wound vac on groin- to follow up outpt 2. supratherapeutic INR- >10- s/p Vit K now down to 2.39 management per primary, coumadin per pharmacy 3. ESRD - MWF @ [place] - via PC for now- plans for permanent access ongoing per vascular 4. HyPOtension/volume - 99/62 BP pretty good, on midodine- amazing she was able to come off dobutamine- 5. Anemia - hgb 8.4 cont Aranesp and Fe  6. Metabolic bone disease - Ca+ 8.8. Cont hectorol- phos 7.3-  she does not need to take vitamin D and will start phoslo which should be continued at discharge 7. Nutrition - heart healthy/carb modified w fluid restriction. alb 2.6 8. DM- per primary 9. Perm access- eval pending with VVS   Shelle Iron, NP [place] [phone] 11/10/2013,1:10 PM  LOS: 4 days   Pt seen, examined and agree w A/P as above.  [name] MD pager [phone]    cell [phone] 11/10/2013, 1:27 PM    Additional Objective Labs: Basic Metabolic  Panel:  Recent Labs Lab 11/06/13 1035 11/08/13 0513 11/09/13 0758  NA 137 136* 132*  K 4.4 4.1 4.4  CL 90* 96 90*  CO2 25 26 25   GLUCOSE 144* 154* 123*  BUN 34* 21 32*  CREATININE 5.04* 4.21* 5.76*  CALCIUM 8.8 8.1* 8.3*  PHOS  --  5.3* 7.3*   Liver Function Tests:  Recent Labs Lab 11/06/13 1035 11/08/13 0513 11/09/13 0758  AST 23  --   --   ALT 20  --   --   ALKPHOS 146*  --   --   BILITOT 0.3  --   --   PROT 6.2  --   --   ALBUMIN 2.6* 2.3* 2.1*   No results found for this basename: LIPASE, AMYLASE,  in the last 168 hours CBC:  Recent Labs Lab 11/06/13 1035 11/08/13 0513 11/09/13 0758  WBC 5.8 5.4 5.0  NEUTROABS 4.1  --   --   HGB 8.2* 8.7* 8.4*  HCT 27.7* 29.2* 27.9*  MCV 87.9 87.7 88.3  PLT 453* 405* 380   Blood Culture    Component Value Date/Time   SDES BLOOD RIGHT FOREARM 11/06/2013 1130   SPECREQUEST BOTTLES DRAWN AEROBIC AND ANAEROBIC 4CC 11/06/2013 1130   CULT  Value:        BLOOD CULTURE RECEIVED NO GROWTH TO DATE CULTURE WILL BE HELD FOR 5 DAYS BEFORE ISSUING A  FINAL NEGATIVE REPORT Performed at [facility] 11/06/2013 1130   REPTSTATUS PENDING 11/06/2013 1130    Cardiac Enzymes: No results found for this basename: CKTOTAL, CKMB, CKMBINDEX, TROPONINI,  in the last 168 hours CBG:  Recent Labs Lab 11/09/13 1232 11/09/13 1642 11/09/13 1959 11/10/13 0814 11/10/13 1229  GLUCAP 150* 180* 95 110* 93   Iron Studies: No results found for this basename: IRON, TIBC, TRANSFERRIN, FERRITIN,  in the last 72 hours @lablastinr3 @ Studies/Results: No results found. Medications:   . aspirin EC  81 mg Oral Daily  . atorvastatin  80 mg Oral q1800  . calcium acetate  1,334 mg Oral TID WC  . collagenase   Topical Daily  . darbepoetin (ARANESP) injection - DIALYSIS  150 mcg Intravenous Q Wed-HD  . doxercalciferol  2 mcg Intravenous Q M,W,F-HD  . ferric gluconate (FERRLECIT/NULECIT) IV  62.5 mg Intravenous Weekly  . insulin aspart  0-9 Units  Subcutaneous TID WC  . insulin aspart  5 Units Subcutaneous TID WC  . insulin detemir  20 Units Subcutaneous QHS  . midodrine  10 mg Oral TID WC  . pantoprazole  40 mg Oral Daily  . piperacillin-tazobactam (ZOSYN)  IV  2.25 g Intravenous Q8H  . sodium chloride  3 mL Intravenous Q12H  . vancomycin  1,000 mg Intravenous Q M,W,F-HD  . warfarin  0.5 mg Oral ONCE-1800  . Warfarin - Pharmacist Dosing Inpatient   Does not apply [phone]

## 2013-11-10 NOTE — Care Management Note (Signed)
    Page 1 of 1   11/10/2013     3:50:32 PM CARE MANAGEMENT NOTE 11/10/2013  Patient:  Hannah [name], Hannah [name]   Account Number:  [id number]  Date Initiated:  11/10/2013  Documentation initiated by:  [facility]  Subjective/Objective Assessment:   adm: Cellulitis     Action/Plan:   home health   Anticipated DC Date:  11/10/2013   Anticipated DC Plan:  [place]  CM consult      Choice offered to / List presented to:          [facility] arranged  HH-1 RN      [place].   Status of service:  Completed, signed off Medicare Important Message given?   (If response is "NO", the following Medicare IM given date fields will be blank) Date Medicare IM given:   Medicare IM given by:   Date Additional Medicare IM given:   Additional Medicare IM given by:    Discharge Disposition:  [place]  Per UR Regulation:    If discussed at Long Length of Stay Meetings, dates discussed:    Comments:  11/10/13 15:45 Cm received call from RN who states pt was discharged and then MD placed a [facility] order for dressing changes.  CM made calls to pt's home but there was no answer.  CM spoke to RN who states she was with [facility].  CM notes pt used [facility] in May of 2015 and called [facility] with referral for [place].  No other CM needs were communicated. Mariane Masters, BSN, CM [phone].

## 2013-11-10 NOTE — Progress Notes (Signed)
ANTIBIOTIC/ANTICOAGULATION CONSULT NOTE - Follow Up Consult  Pharmacy Consult for Coumadin Indication: R arm DVT  Allergies  Allergen Reactions  . Crestor [Rosuvastatin] Other (See Comments)    Severe muscle weakness  . Nsaids Other (See Comments)    Not allergic, "bad on my kidneys"  . Ciprofloxacin Rash   Labs:  Recent Labs  11/08/13 0513 11/09/13 0758  WBC 5.4 5.0  HGB 8.7* 8.4*  PLT 405* 380  CREATININE 4.21* 5.76*   Estimated Creatinine Clearance: 17 ml/min (by C-G formula based on Cr of 5.76).  Assessment: 43 y.o. female presents from HF clinic with RLE cellulitis. 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  During that hospitalization, she was on warfarin for R arm DVT. This was stopped [facility] 7/16 d/t supratherapeutic INR. Home dose was 2mg  daily. INR remained >10 despite no doses being given since 7/16. She received vitamin K 2.5mg  po x1 on 7/21 and 7/22.   INR still therapeutic   Goal of Therapy: INR 2-3 Monitor platelets by anticoagulation protocol: Yes  Plan:  1. Warfarin 0.5mg  po x1 tonight. 2. Daily PT/INR 3. Follow for s/s bleeding  Thank you. Anette Guarneri, PharmD [phone]  11/10/2013 12:54 PM

## 2013-11-10 NOTE — Progress Notes (Signed)
Patient Discharge:  Patient discharged home with husband in w/c.  Patient hemodynamically stable with no distress or discomfort noted.  Dressings to right groin and right lower extremity changed before the discharge.  Husband knowledgeable with dressing changes as he was doing before the admission at home.  Education: She was educated about the medications, prescriptions and follow-up appointments and also given a hand out on easy to read cellulitis.  Patient understood and acknowledged. IV: Peripheral IV removed before discharge. Belongings:  Patient took all her belongings with her.

## 2013-11-10 NOTE — Discharge Summary (Signed)
Physician Discharge Summary  Hannah [name] KNL:[id number] DOB: 1970/05/21 DOA: 11/06/2013  PCP: Vic Blackbird, MD  Admit date: 11/06/2013 Discharge date: 11/10/2013  Time spent: 30 minutes  Recommendations for Outpatient Follow-up:  1. Follow up with Dr Trula Slade as recommended. 2. Follow up with PCP in one week.   Discharge Diagnoses:  Active Problems:   Adenocarcinoma of lung   Diabetic retinopathy associated with type 2 diabetes mellitus   Venous insufficiency   Essential hypertension, benign   Gastroparesis   GERD (gastroesophageal reflux disease)   Normocytic anemia   DVT of axillary vein, acute right   Cellulitis of right lower extremity   Cellulitis of right leg   ESRD (end stage renal disease) on dialysis   Supratherapeutic INR   Chronic combined systolic and diastolic CHF, NYHA class 3   Discharge Condition: improved.   Diet recommendation: renal  Filed Weights   11/09/13 0746 11/09/13 1216 11/09/13 2004  Weight: 122.7 kg (270 lb 8.1 oz) 118.7 kg (261 lb 11 oz) 123.469 kg (272 lb 3.2 oz)    History of present illness:  43 year old female patient with multiple medical problems including diabetes, chronic kidney disease on dialysis, end-stage 3 chronic combined systolic and diastolic heart failure. She was most recently hospitalized during June of 2015 for protracted period of time and was just discharged 8 days prior to this presentation. She had a complicated hospitalization and began with a heart catheterization with subsequent cardiogenic shock, IABP placement. Subsequently she developed ischemic lower extremity that required vascular surgery consultation and eventual femoral thromboembolectomy with 4 compartment fasciotomy to the right lower extremity. At time of discharge her groin and leg wounds were healing nicely and plans were to continue followup with the vascular surgeons. About 3-4 days ago she began having more than tenderness the right lower extremity with  associated redness. She has also been having difficulties with supratherapeutic INRs with resultant changes in her Coumadin dosage including holding her Coumadin. Because of these ongoing symptoms with color change and pain she presented to the emergency department.  While in the emergency department she was also evaluated by vascular surgery. Because of necrotic tissue seen on initial exam the vascular surgeons sharply debrided these areas and repacked the wound. Other than that the vascular surgeon felt that her fasciotomy sites looked clean. She also had an abdominal incision that also required some minor debridement of necrotic tissue but otherwise also looked clean and recommendations were to continue antibiotics and followup with Dr. Trula Slade as previously scheduled.      Hospital Course:   Cellulitis of right lower extremity/recent right lower extremity ischemia requiring 4 compartment fasciotomy  -Appreciate vascular surgery assistance, currently on IV vancomycin and zosyn day 5..  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 as opposed to active cellulitis noting patient had no fever and no leukocytosis at presentation and was not on antibiotic therapy prior to admission  Supratherapeutic INR  -Was given 2.5 mg vitamin K at admission and her repeat INR today is 2.4  Chronic combined systolic and diastolic CHF, NYHA class 3  -Appears compensated at this time with primary management with dialysis treatments  Diabetic retinopathy associated with type 2 diabetes mellitus  -Continue home dose Levemir with sliding scale insulin  -Hemoglobin A1c in May was 8.0  -Current CBGs reasonably controlled  -  CBG (last 3)   Recent Labs   11/08/13 2101  11/09/13 0738  11/09/13 1232   GLUCAP  111*  131*  150*    Venous insufficiency/recent DVT of axillary vein, acute right  -DVT diagnosed June 4  -DVT and recent thromboembolectomy rationale for  continued anticoagulation  Essential hypertension, benign  -Appears resolved with initiation of dialysis treatments in the setting of chronic systolic heart failure/low output cardiac failure  -Off antihypertensive medications and actually requires midodrine to support blood pressure  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  -Dialysis per nephrology  Stage I Adenocarcinoma of lung  -Status post lobectomy by [facility] April 2013 that did not require postresection chemotherapy  Gastroparesis/GERD  -Chronic and according to patient at baseline  -Continue Protonix  Normocytic anemia  -Hemoglobin stable and at baseline which is greater than 8.0  Procedures:  none  Consultations:  Renal   Vascular   Discharge Exam: Filed Vitals:   11/10/13 0920  BP: 99/62  Pulse: 99  Temp: 98.1 F (36.7 C)  Resp: 19    General: alert afebrile comfortable Cardiovascular: s1s2 Respiratory: ctab  Discharge Instructions You were cared for by a hospitalist during your hospital stay. If you have any questions about your discharge medications or the care you received while you were in the hospital after you are discharged, you can call the unit and asked to speak with the hospitalist on call if the hospitalist that took care of you is not available. Once you are discharged, your primary care physician will handle any further medical issues. Please note that NO REFILLS for any discharge medications will be authorized once you are discharged, as it is imperative that you return to your primary care physician (or establish a relationship with a primary care physician if you do not have one) for your aftercare needs so that they can reassess your need for medications and monitor your lab values.  Discharge Instructions   Diet Carb Modified    Complete by:  As directed      Discharge instructions    Complete by:  As directed   Follow up with vascular surgery in 1 week.  Please follow  up with home health RN for dressing.            Medication List    STOP taking these medications       cholecalciferol 1000 UNITS tablet  Commonly known as:  VITAMIN D      TAKE these medications       acetaminophen 500 MG tablet  Commonly known as:  TYLENOL  Take 500 mg by mouth every 8 (eight) hours as needed for mild pain.     aspirin 81 MG EC tablet  Take 1 tablet (81 mg total) by mouth daily.     atorvastatin 80 MG tablet  Commonly known as:  LIPITOR  Take 1 tablet (80 mg total) by mouth daily at 6 PM.     calcitRIOL 0.25 MCG capsule  Commonly known as:  ROCALTROL  Take 1 capsule (0.25 mcg total) by mouth daily.     calcium acetate 667 MG capsule  Commonly known as:  PHOSLO  Take 2 capsules (1,334 mg total) by mouth 3 (three) times daily with meals.     clindamycin 300 MG capsule  Commonly known as:  CLEOCIN  Take 1 capsule (300 mg total) by mouth 3 (three) times daily.     collagenase ointment  Commonly known as:  SANTYL  Apply topically daily.     cyclobenzaprine 10 MG tablet  Commonly known as:  FLEXERIL  Take 5-10 mg  by mouth 3 (three) times daily as needed for muscle spasms.     Insulin Detemir 100 UNIT/ML Pen  Commonly known as:  LEVEMIR FLEXPEN  Inject 20 Units into the skin every evening.     insulin lispro 100 UNIT/ML KiwkPen  Commonly known as:  HUMALOG KWIKPEN  Inject 0.05 mLs (5 Units total) into the skin 3 (three) times daily with meals.     methylcellulose 1 % ophthalmic solution  Commonly known as:  ARTIFICIAL TEARS  Place 1 drop into both eyes at bedtime as needed (dry eyes).     midodrine 10 MG tablet  Commonly known as:  PROAMATINE  Take 1 tablet (10 mg total) by mouth 3 (three) times daily with meals.     ondansetron 4 MG tablet  Commonly known as:  ZOFRAN  Take 1 tablet (4 mg total) by mouth every 8 (eight) hours as needed for nausea or vomiting.     oxyCODONE-acetaminophen 5-325 MG per tablet  Commonly known as:   PERCOCET/ROXICET  Take 1-2 tablets by mouth every 8 (eight) hours as needed for moderate pain or severe pain.     pantoprazole 40 MG tablet  Commonly known as:  PROTONIX  Take 1 tablet (40 mg total) by mouth daily.     polyethylene glycol packet  Commonly known as:  MIRALAX / GLYCOLAX  Take 17 g by mouth daily as needed (constipation).     warfarin 2 MG tablet  Commonly known as:  COUMADIN  Take 1 tablet (2 mg total) by mouth daily at 6 PM.       Allergies  Allergen Reactions  . Crestor [Rosuvastatin] Other (See Comments)    Severe muscle weakness  . Nsaids Other (See Comments)    Not allergic, "bad on my kidneys"  . Ciprofloxacin Rash       Follow-up Information   Follow up with Vic Blackbird, MD. Schedule an appointment as soon as possible for a visit in 1 week.   Specialty:  Family Medicine   Contact information:   [address] [place] [address] [phone]       Follow up with Ramond Craver, [place], MD. Schedule an appointment as soon as possible for a visit in 1 week.   Specialty:  Vascular Surgery   Contact information:   [address] [place]  [address] [phone]        The results of significant diagnostics from this hospitalization (including imaging, microbiology, ancillary and laboratory) are listed below for reference.    Significant Diagnostic Studies: No results found.  Microbiology: Recent Results (from the past 240 hour(s))  CULTURE, BLOOD (ROUTINE X 2)     Status: None   Collection Time    11/06/13 11:15 AM      Result Value Ref Range Status   Specimen Description BLOOD RIGHT ANTECUBITAL   Final   Special Requests BOTTLES DRAWN AEROBIC AND ANAEROBIC 5CC   Final   Culture  Setup Time     Final   Value: 11/06/2013 17:12     Performed at [facility]   Culture     Final   Value:        BLOOD CULTURE RECEIVED NO GROWTH TO DATE CULTURE WILL BE HELD FOR 5 DAYS BEFORE ISSUING A FINAL NEGATIVE REPORT     Performed at  [facility]   Report Status PENDING   Incomplete  CULTURE, BLOOD (ROUTINE X 2)     Status: None  Collection Time    11/06/13 11:30 AM      Result Value Ref Range Status   Specimen Description BLOOD RIGHT FOREARM   Final   Special Requests BOTTLES DRAWN AEROBIC AND ANAEROBIC 4CC   Final   Culture  Setup Time     Final   Value: 11/06/2013 17:12     Performed at [facility]   Culture     Final   Value:        BLOOD CULTURE RECEIVED NO GROWTH TO DATE CULTURE WILL BE HELD FOR 5 DAYS BEFORE ISSUING A FINAL NEGATIVE REPORT     Performed at [facility]   Report Status PENDING   Incomplete  MRSA PCR SCREENING     Status: None   Collection Time    11/06/13  7:45 PM      Result Value Ref Range Status   MRSA by PCR NEGATIVE  NEGATIVE Final   Comment:            The GeneXpert MRSA Assay (FDA     approved for NASAL specimens     only), is one component of a     comprehensive MRSA colonization     surveillance program. It is not     intended to diagnose MRSA     infection nor to guide or     monitor treatment for     MRSA infections.     Labs: Basic Metabolic Panel:  Recent Labs Lab 11/06/13 1035 11/08/13 0513 11/09/13 0758  NA 137 136* 132*  K 4.4 4.1 4.4  CL 90* 96 90*  CO2 25 26 25   GLUCOSE 144* 154* 123*  BUN 34* 21 32*  CREATININE 5.04* 4.21* 5.76*  CALCIUM 8.8 8.1* 8.3*  PHOS  --  5.3* 7.3*   Liver Function Tests:  Recent Labs Lab 11/06/13 1035 11/08/13 0513 11/09/13 0758  AST 23  --   --   ALT 20  --   --   ALKPHOS 146*  --   --   BILITOT 0.3  --   --   PROT 6.2  --   --   ALBUMIN 2.6* 2.3* 2.1*   No results found for this basename: LIPASE, AMYLASE,  in the last 168 hours No results found for this basename: AMMONIA,  in the last 168 hours CBC:  Recent Labs Lab 11/06/13 1035 11/08/13 0513 11/09/13 0758  WBC 5.8 5.4 5.0  NEUTROABS 4.1  --   --   HGB 8.2* 8.7* 8.4*  HCT 27.7* 29.2* 27.9*  MCV 87.9 87.7 88.3  PLT 453* 405*  380   Cardiac Enzymes: No results found for this basename: CKTOTAL, CKMB, CKMBINDEX, TROPONINI,  in the last 168 hours BNP: BNP (last 3 results)  Recent Labs  08/13/13 1949 09/06/13 0740  PROBNP 34187.0* 28622.0*   CBG:  Recent Labs Lab 11/09/13 1232 11/09/13 1642 11/09/13 1959 11/10/13 0814 11/10/13 1229  GLUCAP 150* 180* 95 110* 93       Signed:  Chrisanna Mishra  [facility] 11/10/2013, 2:19 PM

## 2013-11-12 ENCOUNTER — Telehealth: Payer: Self-pay | Admitting: *Deleted

## 2013-11-12 LAB — CULTURE, BLOOD (ROUTINE X 2)
CULTURE: NO GROWTH
Culture: NO GROWTH

## 2013-11-12 NOTE — Telephone Encounter (Signed)
Order given for [facility] to check INR on 10/31/13 with results to me.

## 2013-11-12 NOTE — Telephone Encounter (Signed)
Pt was just released from hospital so Hannah [name] with [facility] is calling for new orders

## 2013-11-13 ENCOUNTER — Ambulatory Visit (INDEPENDENT_AMBULATORY_CARE_PROVIDER_SITE_OTHER): Payer: Medicare Other | Admitting: *Deleted

## 2013-11-13 ENCOUNTER — Telehealth: Payer: Self-pay | Admitting: *Deleted

## 2013-11-13 DIAGNOSIS — I82A19 Acute embolism and thrombosis of unspecified axillary vein: Secondary | ICD-10-CM

## 2013-11-13 DIAGNOSIS — Z5181 Encounter for therapeutic drug level monitoring: Secondary | ICD-10-CM

## 2013-11-13 DIAGNOSIS — I82A11 Acute embolism and thrombosis of right axillary vein: Secondary | ICD-10-CM

## 2013-11-13 LAB — POCT INR: INR: 3.6

## 2013-11-13 NOTE — Telephone Encounter (Signed)
[place] # [phone]

## 2013-11-14 NOTE — ED Provider Notes (Signed)
Pt seen and examined. Wounds show cellulitis.  Intact sensation.  Pt c/o pain.  [facility].  I have reviewed her note, and I agree.  Tanna Furry, MD 11/14/13 [phone]

## 2013-11-15 ENCOUNTER — Other Ambulatory Visit: Payer: Self-pay | Admitting: Family Medicine

## 2013-11-15 NOTE — Telephone Encounter (Signed)
Ok to refill??  Last office visit 03/21/2013.   Last refill 07/06/2013.

## 2013-11-16 ENCOUNTER — Telehealth: Payer: Self-pay | Admitting: *Deleted

## 2013-11-16 ENCOUNTER — Ambulatory Visit (INDEPENDENT_AMBULATORY_CARE_PROVIDER_SITE_OTHER): Payer: Medicare Other | Admitting: *Deleted

## 2013-11-16 DIAGNOSIS — I82A11 Acute embolism and thrombosis of right axillary vein: Secondary | ICD-10-CM

## 2013-11-16 DIAGNOSIS — Z5181 Encounter for therapeutic drug level monitoring: Secondary | ICD-10-CM

## 2013-11-16 DIAGNOSIS — I82A19 Acute embolism and thrombosis of unspecified axillary vein: Secondary | ICD-10-CM

## 2013-11-16 LAB — POCT INR: INR: 3.9

## 2013-11-16 MED ORDER — WARFARIN SODIUM 1 MG PO TABS: ORAL_TABLET | ORAL | Status: DC

## 2013-11-16 NOTE — Telephone Encounter (Signed)
Okay to refill? 

## 2013-11-16 NOTE — Telephone Encounter (Signed)
Prescription sent to pharmacy.

## 2013-11-16 NOTE — Telephone Encounter (Signed)
Warfarin 2mg  d/c and ordered 1mg  tablet.

## 2013-11-19 ENCOUNTER — Telehealth: Payer: Self-pay | Admitting: *Deleted

## 2013-11-19 ENCOUNTER — Ambulatory Visit (INDEPENDENT_AMBULATORY_CARE_PROVIDER_SITE_OTHER): Payer: Medicare Other | Admitting: *Deleted

## 2013-11-19 DIAGNOSIS — I82A19 Acute embolism and thrombosis of unspecified axillary vein: Secondary | ICD-10-CM

## 2013-11-19 DIAGNOSIS — Z5181 Encounter for therapeutic drug level monitoring: Secondary | ICD-10-CM

## 2013-11-19 DIAGNOSIS — I82A11 Acute embolism and thrombosis of right axillary vein: Secondary | ICD-10-CM

## 2013-11-19 LAB — POCT INR: INR: 4.5

## 2013-11-19 NOTE — Telephone Encounter (Signed)
See coumadin note. 

## 2013-11-19 NOTE — Telephone Encounter (Signed)
INR - 4.5 / please call with instructions / tgs

## 2013-11-20 ENCOUNTER — Encounter (HOSPITAL_COMMUNITY): Payer: Self-pay

## 2013-11-21 ENCOUNTER — Ambulatory Visit (INDEPENDENT_AMBULATORY_CARE_PROVIDER_SITE_OTHER): Payer: Medicare Other | Admitting: *Deleted

## 2013-11-21 DIAGNOSIS — I82A19 Acute embolism and thrombosis of unspecified axillary vein: Secondary | ICD-10-CM

## 2013-11-21 DIAGNOSIS — Z5181 Encounter for therapeutic drug level monitoring: Secondary | ICD-10-CM

## 2013-11-21 DIAGNOSIS — I82A11 Acute embolism and thrombosis of right axillary vein: Secondary | ICD-10-CM

## 2013-11-21 LAB — POCT INR: INR: 1.6

## 2013-11-22 ENCOUNTER — Telehealth: Payer: Self-pay | Admitting: Cardiology

## 2013-11-22 NOTE — Telephone Encounter (Signed)
Pt refused physical therapy today,said it was too much after dialysis yesterday.

## 2013-11-22 NOTE — Telephone Encounter (Signed)
Returned call to [place] with [facility] not in office.

## 2013-11-23 ENCOUNTER — Ambulatory Visit (INDEPENDENT_AMBULATORY_CARE_PROVIDER_SITE_OTHER): Payer: Medicare Other | Admitting: *Deleted

## 2013-11-23 ENCOUNTER — Telehealth: Payer: Self-pay | Admitting: *Deleted

## 2013-11-23 ENCOUNTER — Encounter: Payer: Self-pay | Admitting: Surgery

## 2013-11-23 DIAGNOSIS — Z5181 Encounter for therapeutic drug level monitoring: Secondary | ICD-10-CM

## 2013-11-23 DIAGNOSIS — I82A19 Acute embolism and thrombosis of unspecified axillary vein: Secondary | ICD-10-CM

## 2013-11-23 DIAGNOSIS — I82A11 Acute embolism and thrombosis of right axillary vein: Secondary | ICD-10-CM

## 2013-11-23 LAB — POCT INR: INR: 1.3

## 2013-11-23 NOTE — Telephone Encounter (Signed)
See coumadin note. 

## 2013-11-23 NOTE — Telephone Encounter (Signed)
Per Angie with [facility]  INR   1.3  Please call [phone]

## 2013-11-25 ENCOUNTER — Inpatient Hospital Stay (HOSPITAL_COMMUNITY)
Admission: EM | Admit: 2013-11-25 | Discharge: 2013-11-29 | DRG: 444 | Disposition: A | Discharge status: Home / Self Care | Payer: Medicare Other | Attending: Internal Medicine | Admitting: Internal Medicine

## 2013-11-25 ENCOUNTER — Emergency Department (HOSPITAL_COMMUNITY): Payer: Medicare Other

## 2013-11-25 ENCOUNTER — Encounter (HOSPITAL_COMMUNITY): Payer: Self-pay | Admitting: Emergency Medicine

## 2013-11-25 DIAGNOSIS — K5730 Diverticulosis of large intestine without perforation or abscess without bleeding: Secondary | ICD-10-CM | POA: Diagnosis present

## 2013-11-25 DIAGNOSIS — H269 Unspecified cataract: Secondary | ICD-10-CM | POA: Diagnosis present

## 2013-11-25 DIAGNOSIS — Z794 Long term (current) use of insulin: Secondary | ICD-10-CM

## 2013-11-25 DIAGNOSIS — K219 Gastro-esophageal reflux disease without esophagitis: Secondary | ICD-10-CM | POA: Diagnosis present

## 2013-11-25 DIAGNOSIS — R1115 Cyclical vomiting syndrome unrelated to migraine: Secondary | ICD-10-CM | POA: Diagnosis present

## 2013-11-25 DIAGNOSIS — I509 Heart failure, unspecified: Secondary | ICD-10-CM | POA: Diagnosis present

## 2013-11-25 DIAGNOSIS — Z7982 Long term (current) use of aspirin: Secondary | ICD-10-CM | POA: Diagnosis not present

## 2013-11-25 DIAGNOSIS — I472 Ventricular tachycardia, unspecified: Secondary | ICD-10-CM | POA: Diagnosis present

## 2013-11-25 DIAGNOSIS — M899 Disorder of bone, unspecified: Secondary | ICD-10-CM | POA: Diagnosis present

## 2013-11-25 DIAGNOSIS — R112 Nausea with vomiting, unspecified: Secondary | ICD-10-CM

## 2013-11-25 DIAGNOSIS — I82A19 Acute embolism and thrombosis of unspecified axillary vein: Secondary | ICD-10-CM | POA: Diagnosis present

## 2013-11-25 DIAGNOSIS — Z79899 Other long term (current) drug therapy: Secondary | ICD-10-CM

## 2013-11-25 DIAGNOSIS — I5042 Chronic combined systolic (congestive) and diastolic (congestive) heart failure: Secondary | ICD-10-CM

## 2013-11-25 DIAGNOSIS — I2589 Other forms of chronic ischemic heart disease: Secondary | ICD-10-CM | POA: Diagnosis present

## 2013-11-25 DIAGNOSIS — E11319 Type 2 diabetes mellitus with unspecified diabetic retinopathy without macular edema: Secondary | ICD-10-CM | POA: Diagnosis present

## 2013-11-25 DIAGNOSIS — G609 Hereditary and idiopathic neuropathy, unspecified: Secondary | ICD-10-CM | POA: Diagnosis present

## 2013-11-25 DIAGNOSIS — I255 Ischemic cardiomyopathy: Secondary | ICD-10-CM | POA: Diagnosis present

## 2013-11-25 DIAGNOSIS — N390 Urinary tract infection, site not specified: Secondary | ICD-10-CM | POA: Diagnosis present

## 2013-11-25 DIAGNOSIS — I2510 Atherosclerotic heart disease of native coronary artery without angina pectoris: Secondary | ICD-10-CM | POA: Diagnosis present

## 2013-11-25 DIAGNOSIS — E1139 Type 2 diabetes mellitus with other diabetic ophthalmic complication: Secondary | ICD-10-CM | POA: Diagnosis present

## 2013-11-25 DIAGNOSIS — Z992 Dependence on renal dialysis: Secondary | ICD-10-CM

## 2013-11-25 DIAGNOSIS — E785 Hyperlipidemia, unspecified: Secondary | ICD-10-CM | POA: Diagnosis present

## 2013-11-25 DIAGNOSIS — I4729 Other ventricular tachycardia: Secondary | ICD-10-CM | POA: Diagnosis present

## 2013-11-25 DIAGNOSIS — D638 Anemia in other chronic diseases classified elsewhere: Secondary | ICD-10-CM | POA: Diagnosis present

## 2013-11-25 DIAGNOSIS — Z87891 Personal history of nicotine dependence: Secondary | ICD-10-CM | POA: Diagnosis not present

## 2013-11-25 DIAGNOSIS — H5440 Blindness, one eye, unspecified eye: Secondary | ICD-10-CM | POA: Diagnosis present

## 2013-11-25 DIAGNOSIS — Z7901 Long term (current) use of anticoagulants: Secondary | ICD-10-CM

## 2013-11-25 DIAGNOSIS — E119 Type 2 diabetes mellitus without complications: Secondary | ICD-10-CM | POA: Diagnosis present

## 2013-11-25 DIAGNOSIS — Z801 Family history of malignant neoplasm of trachea, bronchus and lung: Secondary | ICD-10-CM

## 2013-11-25 DIAGNOSIS — E1129 Type 2 diabetes mellitus with other diabetic kidney complication: Secondary | ICD-10-CM | POA: Diagnosis present

## 2013-11-25 DIAGNOSIS — I6529 Occlusion and stenosis of unspecified carotid artery: Secondary | ICD-10-CM | POA: Diagnosis present

## 2013-11-25 DIAGNOSIS — K8020 Calculus of gallbladder without cholecystitis without obstruction: Secondary | ICD-10-CM | POA: Diagnosis present

## 2013-11-25 DIAGNOSIS — Z85118 Personal history of other malignant neoplasm of bronchus and lung: Secondary | ICD-10-CM | POA: Diagnosis not present

## 2013-11-25 DIAGNOSIS — N251 Nephrogenic diabetes insipidus: Secondary | ICD-10-CM | POA: Diagnosis present

## 2013-11-25 DIAGNOSIS — Z5181 Encounter for therapeutic drug level monitoring: Secondary | ICD-10-CM

## 2013-11-25 DIAGNOSIS — R109 Unspecified abdominal pain: Secondary | ICD-10-CM | POA: Insufficient documentation

## 2013-11-25 DIAGNOSIS — E7800 Pure hypercholesterolemia, unspecified: Secondary | ICD-10-CM | POA: Diagnosis present

## 2013-11-25 DIAGNOSIS — Z86718 Personal history of other venous thrombosis and embolism: Secondary | ICD-10-CM | POA: Diagnosis not present

## 2013-11-25 DIAGNOSIS — I10 Essential (primary) hypertension: Secondary | ICD-10-CM

## 2013-11-25 DIAGNOSIS — Z4800 Encounter for change or removal of nonsurgical wound dressing: Secondary | ICD-10-CM | POA: Diagnosis not present

## 2013-11-25 DIAGNOSIS — K810 Acute cholecystitis: Secondary | ICD-10-CM

## 2013-11-25 DIAGNOSIS — Z9861 Coronary angioplasty status: Secondary | ICD-10-CM | POA: Diagnosis not present

## 2013-11-25 DIAGNOSIS — N186 End stage renal disease: Secondary | ICD-10-CM

## 2013-11-25 DIAGNOSIS — I82A11 Acute embolism and thrombosis of right axillary vein: Secondary | ICD-10-CM

## 2013-11-25 DIAGNOSIS — L02419 Cutaneous abscess of limb, unspecified: Secondary | ICD-10-CM

## 2013-11-25 DIAGNOSIS — Z8249 Family history of ischemic heart disease and other diseases of the circulatory system: Secondary | ICD-10-CM | POA: Diagnosis not present

## 2013-11-25 DIAGNOSIS — K668 Other specified disorders of peritoneum: Secondary | ICD-10-CM

## 2013-11-25 DIAGNOSIS — I120 Hypertensive chronic kidney disease with stage 5 chronic kidney disease or end stage renal disease: Secondary | ICD-10-CM | POA: Diagnosis present

## 2013-11-25 DIAGNOSIS — M949 Disorder of cartilage, unspecified: Secondary | ICD-10-CM

## 2013-11-25 DIAGNOSIS — C3490 Malignant neoplasm of unspecified part of unspecified bronchus or lung: Secondary | ICD-10-CM | POA: Diagnosis present

## 2013-11-25 DIAGNOSIS — L03119 Cellulitis of unspecified part of limb: Secondary | ICD-10-CM

## 2013-11-25 DIAGNOSIS — L03115 Cellulitis of right lower limb: Secondary | ICD-10-CM

## 2013-11-25 LAB — URINALYSIS, ROUTINE W REFLEX MICROSCOPIC
BILIRUBIN URINE: NEGATIVE
Glucose, UA: 250 mg/dL — AB
Ketones, ur: 15 mg/dL — AB
NITRITE: NEGATIVE
Specific Gravity, Urine: 1.019 (ref 1.005–1.030)
UROBILINOGEN UA: 0.2 mg/dL (ref 0.0–1.0)
pH: 6.5 (ref 5.0–8.0)

## 2013-11-25 LAB — COMPREHENSIVE METABOLIC PANEL
ALT: 19 U/L (ref 0–35)
ANION GAP: 15 (ref 5–15)
AST: 24 U/L (ref 0–37)
Albumin: 2.6 g/dL — ABNORMAL LOW (ref 3.5–5.2)
Alkaline Phosphatase: 100 U/L (ref 39–117)
BUN: 40 mg/dL — ABNORMAL HIGH (ref 6–23)
CALCIUM: 9.4 mg/dL (ref 8.4–10.5)
CO2: 28 meq/L (ref 19–32)
Chloride: 96 mEq/L (ref 96–112)
Creatinine, Ser: 5.3 mg/dL — ABNORMAL HIGH (ref 0.50–1.10)
GFR, EST AFRICAN AMERICAN: 10 mL/min — AB (ref >90)
GFR, EST NON AFRICAN AMERICAN: 9 mL/min — AB (ref >90)
GLUCOSE: 152 mg/dL — AB (ref 70–99)
Potassium: 5.1 mEq/L (ref 3.7–5.3)
Sodium: 139 mEq/L (ref 137–147)
TOTAL PROTEIN: 6.1 g/dL (ref 6.0–8.3)
Total Bilirubin: 0.5 mg/dL (ref 0.3–1.2)

## 2013-11-25 LAB — CBC WITH DIFFERENTIAL/PLATELET
BASOS ABS: 0.1 10*3/uL (ref 0.0–0.1)
Basophils Relative: 1 % (ref 0–1)
Eosinophils Absolute: 0.3 10*3/uL (ref 0.0–0.7)
Eosinophils Relative: 5 % (ref 0–5)
HCT: 30.3 % — ABNORMAL LOW (ref 36.0–46.0)
Hemoglobin: 9.1 g/dL — ABNORMAL LOW (ref 12.0–15.0)
LYMPHS ABS: 1 10*3/uL (ref 0.7–4.0)
Lymphocytes Relative: 17 % (ref 12–46)
MCH: 27.5 pg (ref 26.0–34.0)
MCHC: 30 g/dL (ref 30.0–36.0)
MCV: 91.5 fL (ref 78.0–100.0)
Monocytes Absolute: 0.4 10*3/uL (ref 0.1–1.0)
Monocytes Relative: 7 % (ref 3–12)
NEUTROS PCT: 70 % (ref 43–77)
Neutro Abs: 4.3 10*3/uL (ref 1.7–7.7)
Platelets: 341 10*3/uL (ref 150–400)
RBC: 3.31 MIL/uL — AB (ref 3.87–5.11)
RDW: 20.2 % — AB (ref 11.5–15.5)
WBC: 6.2 10*3/uL (ref 4.0–10.5)

## 2013-11-25 LAB — URINE MICROSCOPIC-ADD ON

## 2013-11-25 LAB — CALCIUM: Calcium: 9.5 mg/dL (ref 8.4–10.5)

## 2013-11-25 LAB — MAGNESIUM: Magnesium: 2.3 mg/dL (ref 1.5–2.5)

## 2013-11-25 LAB — PROTIME-INR
INR: 1.24 (ref 0.00–1.49)
Prothrombin Time: 15.6 seconds — ABNORMAL HIGH (ref 11.6–15.2)

## 2013-11-25 LAB — GLUCOSE, CAPILLARY: Glucose-Capillary: 100 mg/dL — ABNORMAL HIGH (ref 70–99)

## 2013-11-25 LAB — LIPASE, BLOOD: Lipase: 32 U/L (ref 11–59)

## 2013-11-25 LAB — PHOSPHORUS: Phosphorus: 6 mg/dL — ABNORMAL HIGH (ref 2.3–4.6)

## 2013-11-25 MED ORDER — MORPHINE SULFATE 2 MG/ML IJ SOLN
1.0000 mg | INTRAMUSCULAR | Status: DC | PRN
  Administered 2013-11-26 – 2013-11-29 (×12): 1 mg via INTRAVENOUS
  Filled 2013-11-25 (×11): qty 1

## 2013-11-25 MED ORDER — MORPHINE SULFATE 4 MG/ML IJ SOLN
4.0000 mg | Freq: Once | INTRAMUSCULAR | Status: AC
  Administered 2013-11-25: 4 mg via INTRAVENOUS
  Filled 2013-11-25 (×2): qty 1

## 2013-11-25 MED ORDER — ACETAMINOPHEN 325 MG PO TABS
650.0000 mg | ORAL_TABLET | Freq: Four times a day (QID) | ORAL | Status: DC | PRN
Start: 2013-11-25 — End: 2013-11-29
  Administered 2013-11-26 – 2013-11-27 (×2): 650 mg via ORAL
  Filled 2013-11-25 (×2): qty 2

## 2013-11-25 MED ORDER — PIPERACILLIN-TAZOBACTAM 3.375 G IVPB
3.3750 g | Freq: Once | INTRAVENOUS | Status: AC
  Administered 2013-11-25: 3.375 g via INTRAVENOUS
  Filled 2013-11-25: qty 50

## 2013-11-25 MED ORDER — HEPARIN SODIUM (PORCINE) 5000 UNIT/ML IJ SOLN
5000.0000 [IU] | Freq: Three times a day (TID) | INTRAMUSCULAR | Status: DC
  Administered 2013-11-25 – 2013-11-27 (×5): 5000 [IU] via SUBCUTANEOUS
  Filled 2013-11-25 (×7): qty 1

## 2013-11-25 MED ORDER — DOXERCALCIFEROL 4 MCG/2ML IV SOLN
4.0000 ug | INTRAVENOUS | Status: DC
Start: 2013-11-26 — End: 2013-11-25

## 2013-11-25 MED ORDER — INSULIN ASPART 100 UNIT/ML ~~LOC~~ SOLN: 0.0000 [IU] | Freq: Three times a day (TID) | SUBCUTANEOUS | Status: DC

## 2013-11-25 MED ORDER — MORPHINE SULFATE 4 MG/ML IJ SOLN
4.0000 mg | Freq: Once | INTRAMUSCULAR | Status: AC
  Administered 2013-11-25: 4 mg via INTRAVENOUS
  Filled 2013-11-25: qty 1

## 2013-11-25 MED ORDER — PIPERACILLIN-TAZOBACTAM IN DEX 2-0.25 GM/50ML IV SOLN
2.2500 g | Freq: Three times a day (TID) | INTRAVENOUS | Status: DC
  Administered 2013-11-25 – 2013-11-27 (×6): 2.25 g via INTRAVENOUS
  Filled 2013-11-25 (×12): qty 50

## 2013-11-25 MED ORDER — RENA-VITE PO TABS
1.0000 | ORAL_TABLET | Freq: Every day | ORAL | Status: DC
  Administered 2013-11-25 – 2013-11-28 (×4): 1 via ORAL
  Filled 2013-11-25 (×5): qty 1

## 2013-11-25 MED ORDER — INSULIN DETEMIR 100 UNIT/ML FLEXPEN: 10.0000 [IU] | PEN_INJECTOR | Freq: Every evening | SUBCUTANEOUS | Status: DC

## 2013-11-25 MED ORDER — ONDANSETRON HCL 4 MG/2ML IJ SOLN
4.0000 mg | Freq: Once | INTRAMUSCULAR | Status: AC
  Administered 2013-11-25: 4 mg via INTRAVENOUS
  Filled 2013-11-25: qty 2

## 2013-11-25 MED ORDER — POLYVINYL ALCOHOL 1.4 % OP SOLN
1.0000 [drp] | Freq: Every evening | OPHTHALMIC | Status: DC | PRN
  Filled 2013-11-25: qty 15

## 2013-11-25 MED ORDER — SODIUM CHLORIDE 0.9 % IV SOLN
125.0000 mg | INTRAVENOUS | Status: DC
  Administered 2013-11-26 – 2013-11-28 (×2): 125 mg via INTRAVENOUS
  Filled 2013-11-25 (×3): qty 10

## 2013-11-25 MED ORDER — FENTANYL CITRATE 0.05 MG/ML IJ SOLN
INTRAMUSCULAR | Status: AC
  Filled 2013-11-25: qty 2

## 2013-11-25 MED ORDER — PANTOPRAZOLE SODIUM 40 MG PO TBEC
40.0000 mg | DELAYED_RELEASE_TABLET | Freq: Every day | ORAL | Status: DC
  Administered 2013-11-26 – 2013-11-29 (×4): 40 mg via ORAL
  Filled 2013-11-25 (×4): qty 1

## 2013-11-25 MED ORDER — DARBEPOETIN ALFA-POLYSORBATE 150 MCG/0.3ML IJ SOLN
150.0000 ug | INTRAMUSCULAR | Status: DC
  Filled 2013-11-25: qty 0.3

## 2013-11-25 MED ORDER — ONDANSETRON HCL 4 MG PO TABS
4.0000 mg | ORAL_TABLET | Freq: Four times a day (QID) | ORAL | Status: DC | PRN
  Administered 2013-11-27: 4 mg via ORAL
  Filled 2013-11-25: qty 1

## 2013-11-25 MED ORDER — LANTHANUM CARBONATE 500 MG PO CHEW
1000.0000 mg | CHEWABLE_TABLET | Freq: Three times a day (TID) | ORAL | Status: DC
  Administered 2013-11-27 – 2013-11-29 (×6): 1000 mg via ORAL
  Filled 2013-11-25 (×14): qty 2

## 2013-11-25 MED ORDER — CALCIUM ACETATE 667 MG PO CAPS
1334.0000 mg | ORAL_CAPSULE | Freq: Three times a day (TID) | ORAL | Status: DC
  Filled 2013-11-25 (×2): qty 2

## 2013-11-25 MED ORDER — INSULIN DETEMIR 100 UNIT/ML ~~LOC~~ SOLN
10.0000 [IU] | Freq: Every day | SUBCUTANEOUS | Status: DC
  Administered 2013-11-26 – 2013-11-28 (×3): 10 [IU] via SUBCUTANEOUS
  Filled 2013-11-25 (×5): qty 0.1

## 2013-11-25 MED ORDER — SODIUM CHLORIDE 0.9 % IJ SOLN: 3.0000 mL | INTRAMUSCULAR | Status: DC | PRN

## 2013-11-25 MED ORDER — ONDANSETRON HCL 4 MG/2ML IJ SOLN
4.0000 mg | Freq: Four times a day (QID) | INTRAMUSCULAR | Status: DC | PRN
  Administered 2013-11-25 – 2013-11-26 (×3): 4 mg via INTRAVENOUS
  Filled 2013-11-25 (×3): qty 2

## 2013-11-25 MED ORDER — SODIUM CHLORIDE 0.9 % IJ SOLN
3.0000 mL | Freq: Two times a day (BID) | INTRAMUSCULAR | Status: DC
  Administered 2013-11-25 – 2013-11-26 (×3): 3 mL via INTRAVENOUS
  Administered 2013-11-27: 17:00:00 via INTRAVENOUS
  Administered 2013-11-28 – 2013-11-29 (×2): 3 mL via INTRAVENOUS

## 2013-11-25 MED ORDER — METHYLCELLULOSE 1 % OP SOLN: 1.0000 [drp] | Freq: Every evening | OPHTHALMIC | Status: DC | PRN

## 2013-11-25 MED ORDER — ASPIRIN EC 81 MG PO TBEC
81.0000 mg | DELAYED_RELEASE_TABLET | Freq: Every day | ORAL | Status: DC
  Administered 2013-11-25 – 2013-11-29 (×5): 81 mg via ORAL
  Filled 2013-11-25 (×5): qty 1

## 2013-11-25 MED ORDER — MIDODRINE HCL 5 MG PO TABS
10.0000 mg | ORAL_TABLET | Freq: Three times a day (TID) | ORAL | Status: DC
  Administered 2013-11-25 – 2013-11-29 (×11): 10 mg via ORAL
  Filled 2013-11-25 (×15): qty 2

## 2013-11-25 MED ORDER — SODIUM CHLORIDE 0.9 % IV SOLN: 250.0000 mL | INTRAVENOUS | Status: DC | PRN

## 2013-11-25 MED ORDER — ACETAMINOPHEN 650 MG RE SUPP: 650.0000 mg | Freq: Four times a day (QID) | RECTAL | Status: DC | PRN

## 2013-11-25 MED ORDER — PIPERACILLIN-TAZOBACTAM 3.375 G IVPB 30 MIN
3.3750 g | Freq: Once | INTRAVENOUS | Status: DC
Start: 2013-11-25 — End: 2013-11-25

## 2013-11-25 MED ORDER — IOHEXOL 300 MG/ML  SOLN: 50.0000 mL | Freq: Once | INTRAMUSCULAR | Status: DC | PRN

## 2013-11-25 MED ORDER — ATORVASTATIN CALCIUM 80 MG PO TABS
80.0000 mg | ORAL_TABLET | Freq: Every day | ORAL | Status: DC
  Administered 2013-11-25 – 2013-11-28 (×4): 80 mg via ORAL
  Filled 2013-11-25 (×5): qty 1

## 2013-11-25 MED ORDER — HYDROMORPHONE HCL PF 1 MG/ML IJ SOLN
1.0000 mg | INTRAMUSCULAR | Status: DC | PRN
  Administered 2013-11-25 – 2013-11-26 (×5): 1 mg via INTRAVENOUS
  Filled 2013-11-25 (×5): qty 1

## 2013-11-25 MED ORDER — PIPERACILLIN-TAZOBACTAM IN DEX 2-0.25 GM/50ML IV SOLN
2.2500 g | Freq: Three times a day (TID) | INTRAVENOUS | Status: DC
  Filled 2013-11-25: qty 50

## 2013-11-25 NOTE — Consult Note (Signed)
[facility] Renal Consultation Note    Indication for Consultation:  Management of ESRD/hemodialysis; anemia, hypertension/volume and secondary hyperparathyroidism  HPI: Hannah [name] is a 43 y.o. female relatively new to hemodialysis as of May 2015 with complex past medical history significant for lung cancer status post LLL lobectomy, systolic and diastolic heart failure (EF 25-30%), and prolonged hospitalization 5/18-7/13/15 for PEA arrest, shock, right heart cath, carotid stenosis, and right foot ischemia secondary to IABP placement which ultimately required a right femoral endarterectomy/thrombectomy/fasciotomy and debridement. She also has a recent admission 7/21-7/25 for RLE cellulitis.  She presented to the ED today complaining epigastric pain radiating to bilateral flanks, nausea and vomiting since Friday after eating seafood. She is afebrile. Admission work-up is negative for leukocytosis or abnormal LFTs. CT abdomen/pelvis is significant for free air surrounding the bladder. Surgery has seen and plans for antibiotics with HIDA scan tomorrow morning. Her last hemodialysis was Friday. Her pain is controlled at this time and has she has had no further episodes of nausea. She was to see Dr Trula Slade tomorrow for outpatient follow up of multiple vascular issues.  Past Medical History  Diagnosis Date  . High cholesterol   . Diabetic retinopathy   . Peripheral neuropathy     "tips of toes"  . Blind right eye   . CHF (congestive heart failure)   . CAD (coronary artery disease)   . GERD (gastroesophageal reflux disease)   . Hypertension   . History of lung cancer 07/2011    s/p left lower lobectomy  . Diabetes mellitus     IDDM  . Cataract of right eye   . Ulcer of toe of right foot 07/10/2012    great toe  . Breast calcification, left 06/2012  . Acute biphenotypic leukemia   . CKD (chronic kidney disease) stage 5, GFR less than 15 ml/min   . Nephrotic syndrome   .  Gastritis     H/o gastritis on prior endoscopy  . Anemia   . Carotid artery disease    Past Surgical History  Procedure Laterality Date  . Cardiac catheterization  07/16/2011  . Incision and drainage breast abscess Left   . Tubal ligation  1994  . Vitrectomy  2010    2 on left, 1 on right  . Cesarean section  1991; 1994  . Video assisted thoracoscopy (vats)/ lobectomy Left 07/30/2011    left main thoracotomy, left lower lobectomy, mediastinal lymph node dissection  . Lobectomy    . Colonoscopy with esophagogastroduodenoscopy (egd) N/A 08/14/2012    Procedure: COLONOSCOPY WITH ESOPHAGOGASTRODUODENOSCOPY (EGD);  Surgeon: Danie Binder, MD;  [facility]: [facility];  Service: Endoscopy;  Laterality: N/A;  10:45-moved to [address] to notify pt  . Breast lumpectomy with needle localization Left 11/14/2012    Procedure: BREAST LUMPECTOMY WITH NEEDLE LOCALIZATION;  Surgeon: Marcello Moores A. Cornett, MD;  Location: [place];  Service: General;  Laterality: Left;  . Insertion of dialysis catheter Left 09/12/2013    Procedure: INSERTION OF DIALYSIS CATHETER;  Surgeon: Mal Misty, MD;  Location: [place];  Service: Vascular;  Laterality: Left;  . Embolectomy Right 09/17/2013    Procedure: Thrombectomy of Right Common Femoral Artery;  Surgeon: Elam Dutch, MD;  Location: [facility] OR;  Service: Vascular;  Laterality: Right;  . Endarterectomy femoral Right 09/17/2013    Procedure: Right Femoral Endarterectomy;  Surgeon: Elam Dutch, MD;  Location: [facility] OR;  Service: Vascular;  Laterality: Right;  . Patch angioplasty Right  09/17/2013    Procedure: Vein Patch Angioplasty of Right Femoral Artery;  Surgeon: Elam Dutch, MD;  Location: [place];  Service: Vascular;  Laterality: Right;  . Fasciotomy Right 09/17/2013    Procedure: Four Compartment Fasciotomy;  Surgeon: Elam Dutch, MD;  Location: [place];  Service: Vascular;  Laterality: Right;  . Fasciotomy closure Right 09/19/2013    Procedure: FASCIOTOMY CLOSURE;   Surgeon: Elam Dutch, MD;  Location: [place];  Service: Vascular;  Laterality: Right;  regional block and monitored anesthesia care used  . I&d extremity Right 09/28/2013    Procedure: IRRIGATION AND DEBRIDEMENT EXTREMITY;  Surgeon: Serafina Mitchell, MD;  Location: [facility] OR;  Service: Vascular;  Laterality: Right;  . Application of wound vac Right 09/28/2013    Procedure: APPLICATION OF WOUND VAC;  Surgeon: Serafina Mitchell, MD;  Location: [facility] OR;  Service: Vascular;  Laterality: Right;   Family History  Problem Relation Age of Onset  . Coronary artery disease Father   . Asthma Father   . COPD Father   . Hypertension Father   . Hyperlipidemia Father   . Diabetes Father   . Congestive Heart Failure Father   . Heart disease Father     before age 58  . Heart attack Father   . Peripheral vascular disease Father   . Hypertension Mother   . Hyperlipidemia Mother   . Diabetes Mother   . Cancer Mother   . Cancer Maternal Aunt      three aunts, bone, breast, ?  . Hypertension Brother   . Diabetes Brother   . Diabetes Sister   . Colon cancer Neg Hx   . Celiac disease Neg Hx   . Crohn's disease Neg Hx   . Ulcerative colitis Neg Hx   . Lung cancer Maternal Grandmother    Social History:  reports that she quit smoking about 2 years ago. She has never used smokeless tobacco. She reports that she does not drink alcohol or use illicit drugs. Allergies  Allergen Reactions  . Crestor [Rosuvastatin] Other (See Comments)    Severe muscle weakness  . Nsaids Other (See Comments)    Not allergic, "bad on my kidneys"  . Ciprofloxacin Rash   Prior to Admission medications   Medication Sig Start Date End Date Taking? Authorizing Provider  acetaminophen (TYLENOL) 500 MG tablet Take 1,000 mg by mouth every 8 (eight) hours as needed for mild pain.    Yes Historical Provider, MD  aspirin EC 81 MG EC tablet Take 1 tablet (81 mg total) by mouth daily. 08/17/13  Yes Belkys A Regalado, MD  atorvastatin  (LIPITOR) 80 MG tablet Take 1 tablet (80 mg total) by mouth daily at 6 PM. 10/29/13  Yes Amy D Clegg, NP  calcium acetate (PHOSLO) 667 MG capsule Take 2 capsules (1,334 mg total) by mouth 3 (three) times daily with meals. 11/10/13  Yes Hosie Poisson, MD  cyclobenzaprine (FLEXERIL) 10 MG tablet Take 10 mg by mouth 3 (three) times daily as needed for muscle spasms.   Yes Historical Provider, MD  Insulin Detemir (LEVEMIR FLEXPEN) 100 UNIT/ML Pen Inject 20 Units into the skin every evening. 10/29/13  Yes Amy D Clegg, NP  insulin lispro (HUMALOG KWIKPEN) 100 UNIT/ML KiwkPen Inject 0.05 mLs (5 Units total) into the skin 3 (three) times daily with meals. 10/29/13  Yes Amy D Clegg, NP  methylcellulose (ARTIFICIAL TEARS) 1 % ophthalmic solution Place 1 drop into both eyes at bedtime as needed (dry  eyes).   Yes Historical Provider, MD  midodrine (PROAMATINE) 10 MG tablet Take 1 tablet (10 mg total) by mouth 3 (three) times daily with meals. 10/29/13  Yes Amy D Clegg, NP  multivitamin (RENA-VIT) TABS tablet Take 1 tablet by mouth daily.   Yes Historical Provider, MD  ondansetron (ZOFRAN) 4 MG tablet Take 1 tablet (4 mg total) by mouth every 8 (eight) hours as needed for nausea or vomiting. 10/29/13  Yes Amy D Clegg, NP  pantoprazole (PROTONIX) 40 MG tablet Take 1 tablet (40 mg total) by mouth daily. 10/29/13  Yes Amy D Clegg, NP  polyethylene glycol (MIRALAX / GLYCOLAX) packet Take 17 g by mouth daily as needed (constipation). 10/29/13  Yes Amy D Clegg, NP  warfarin (COUMADIN) 1 MG tablet Take 1 mg by mouth See admin instructions. Take 1mg  on Friday and Saturday. Sunday take 0.5mg .   Yes Historical Provider, MD   Current Facility-Administered Medications  Medication Dose Route Frequency Provider Last Rate Last Dose  . [START ON 11/28/2013] darbepoetin (ARANESP) injection 150 mcg  150 mcg Intravenous Q Wed-HD Alvia Grove, PA-C      . Derrill Memo ON 11/26/2013] doxercalciferol (HECTOROL) injection 4 mcg  4 mcg Intravenous Q  M,W,F-HD Alvia Grove, PA-C      . Derrill Memo ON 11/26/2013] ferric gluconate (NULECIT) 125 mg in sodium chloride 0.9 % 100 mL IVPB  125 mg Intravenous Q M,W,F-HD Alvia Grove, PA-C      . HYDROmorphone (DILAUDID) injection 1 mg  1 mg Intravenous Q1H PRN Gwenyth Ober, MD   1 mg at 11/25/13 1429  . iohexol (OMNIPAQUE) 300 MG/ML solution 50 mL  50 mL Oral Once PRN Medication Radiologist, MD      . Derrill Memo ON 11/26/2013] piperacillin-tazobactam (ZOSYN) IVPB 2.25 g  2.25 g Intravenous Q8H Rolla Flatten, [facility]      . piperacillin-tazobactam (ZOSYN) IVPB 3.375 g  3.375 g Intravenous Once Rolla Flatten, [facility]       Current Outpatient Prescriptions  Medication Sig Dispense Refill  . acetaminophen (TYLENOL) 500 MG tablet Take 1,000 mg by mouth every 8 (eight) hours as needed for mild pain.       Marland Kitchen aspirin EC 81 MG EC tablet Take 1 tablet (81 mg total) by mouth daily.  30 tablet  0  . atorvastatin (LIPITOR) 80 MG tablet Take 1 tablet (80 mg total) by mouth daily at 6 PM.  30 tablet  6  . calcium acetate (PHOSLO) 667 MG capsule Take 2 capsules (1,334 mg total) by mouth 3 (three) times daily with meals.  90 capsule  0  . cyclobenzaprine (FLEXERIL) 10 MG tablet Take 10 mg by mouth 3 (three) times daily as needed for muscle spasms.      . Insulin Detemir (LEVEMIR FLEXPEN) 100 UNIT/ML Pen Inject 20 Units into the skin every evening.  15 mL  11  . insulin lispro (HUMALOG KWIKPEN) 100 UNIT/ML KiwkPen Inject 0.05 mLs (5 Units total) into the skin 3 (three) times daily with meals.  15 mL  0  . methylcellulose (ARTIFICIAL TEARS) 1 % ophthalmic solution Place 1 drop into both eyes at bedtime as needed (dry eyes).      . midodrine (PROAMATINE) 10 MG tablet Take 1 tablet (10 mg total) by mouth 3 (three) times daily with meals.  90 tablet  6  . multivitamin (RENA-VIT) TABS tablet Take 1 tablet by mouth daily.      . ondansetron (ZOFRAN) 4 MG tablet  Take 1 tablet (4 mg total) by mouth every 8 (eight) hours as needed  for nausea or vomiting.  20 tablet  0  . pantoprazole (PROTONIX) 40 MG tablet Take 1 tablet (40 mg total) by mouth daily.  30 tablet  6  . polyethylene glycol (MIRALAX / GLYCOLAX) packet Take 17 g by mouth daily as needed (constipation).  14 each  6  . warfarin (COUMADIN) 1 MG tablet Take 1 mg by mouth See admin instructions. Take 1mg  on Friday and Saturday. Sunday take 0.5mg .       Labs: Basic Metabolic Panel:  Recent Labs Lab 11/25/13 0903  NA 139  K 5.1  CL 96  CO2 28  GLUCOSE 152*  BUN 40*  CREATININE 5.30*  CALCIUM 9.4   Liver Function Tests:  Recent Labs Lab 11/25/13 0903  AST 24  ALT 19  ALKPHOS 100  BILITOT 0.5  PROT 6.1  ALBUMIN 2.6*    Recent Labs Lab 11/25/13 0903  LIPASE 32   CBC:  Recent Labs Lab 11/25/13 0903  WBC 6.2  NEUTROABS 4.3  HGB 9.1*  HCT 30.3*  MCV 91.5  PLT 341   Studies/Results: Ct Abdomen Pelvis Wo Contrast  11/25/2013   CLINICAL DATA:  Diffuse abdominal pain. Nausea, vomiting since Friday. Dialysis patient. History of CHF, CAD, GERD, hypertension, diabetes. History of lung cancer, chronic kidney disease, gastritis. Previous tubal ligation and lobectomy. Recent cardiac catheterization.  EXAM: CT ABDOMEN AND PELVIS WITHOUT CONTRAST  TECHNIQUE: Multidetector CT imaging of the abdomen and pelvis was performed following the standard protocol without IV contrast.  COMPARISON:  Ultrasound the abdomen 12/05/2012 and 06/01/2011 and PET-CT on 07/28/2011  FINDINGS: Lower chest: There bilateral pleural effusions. Heart is mildly enlarged. There are coronary artery calcifications. Trace pericardial effusion is present.  Upper abdomen: No focal abnormality identified within the liver, is clean spleen, pancreas, or adrenal glands. A left renal cyst is 5.6 cm. There are calcified dependent gallstones within the gallbladder. No other evidence for acute cholecystitis.  Bowel: The stomach and small bowel loops have a normal appearance. The appendix is well  seen and has a normal appearance. There are numerous colonic diverticula but no evidence for acute diverticulitis.  Pelvis: There are small locules of gas surrounding the urinary bladder. The may be a small amount of gas within the dome of the bladder. Question of recent catheterization or other bladder procedure. No pelvic abscess. The uterus is present. No adnexal mass.Small amount of free fluid.  Abdominal wall: There is soft tissue defect in the right lower quadrant consistent with recent surgery. There is diffuse body wall edema. Small right inguinal lymph nodes are noted. These may be reactive.  Osseous structures: Unremarkable.  IMPRESSION: 1. Small amount of gas surrounding the urinary bladder, suspicious for infection or perforation of the urinary bladder. Question of any recent bladder procedures. 2. Soft tissue defect in the right lower quadrant of the abdomen consistent with recent surgery. 3. Body wall edema. 4. Cardiomegaly. 5. Coronary vascular calcifications. 6. Trace pericardial effusion. 7. Cholelithiasis. 8. Left renal cyst. The findings were discussed with Virgie Dad on 11/25/2013 at 12:16 pm.   Electronically Signed   By: Shon Hale M.D.   On: 11/25/2013 12:18    ROS: 10 pt ROS asked and answered. All other systems negative except as above.   Physical Exam: Filed Vitals:   11/25/13 1509 11/25/13 1515 11/25/13 1530 11/25/13 1545  BP: 115/86 117/86 113/71 107/79  Pulse: 98 97 97 98  Temp:      TempSrc:      Resp: 12 21 10 10   Height:      Weight:      SpO2: 90% 92% 95% 92%     General: Well developed, well nourished, in no acute distress.Legally blind Head: Normocephalic, atraumatic, sclera non-icteric, mucus membranes are moist Neck: Supple. JVD not elevated. Lungs: Clear bilaterally to auscultation without wheezes, rales, or rhonchi. Breathing is unlabored. Heart: RRR with S1 S2. No murmurs, rubs, or gallops appreciated. Abdomen: Obese, soft, non-tender to deep palp,  non-distended with normoactive bowel sounds. No rebound/guarding. No obvious abdominal masses. M-S:  Strength and tone appear normal for age. Lower extremities: ++pitting edema. RLE wounds with clean/intact dressings Neuro: Alert and oriented X 3. Moves all extremities spontaneously. Psych:  Responds to questions appropriately with a normal affect. Dialysis Access: [facility]  Dialysis Orders: Center: [facility]  on MWF. EDW 118.5 HD Bath 2/2.25  Time 4 Heparin 5000 Access Lt  I-J TDC    Hectorol 4 mcg IV/HD Aranesp 150 dosed 8/5   Units IV/HD  Venofer load through 8/21  Recent labs: Hgb 8.7 (slowly improving from 7.9 on 7/15) Tsat 19%, P 9.8, PTH 692  Assessment/Plan: 1. Abdominal Pain/Nausea/Vomiting - Work-up on progress. CT abd/pelvis with free air around urinary bladder concerning for infection vs microperforation from diverticulosis. Afebrile. Normal LFTs. Surgery has seen with recs for abx and HIDA scan in the a.m. 2. ESRD -  MWF, no urgent needs. HD tomorrow 3. Hypertension/volume  - SBPs 100s-110s on chronic midodrine TID. Tapering edw down outpatient.  4. Anemia  - Hgb 9.1. On Aranesp 150 mcg/ last dose 8/5. Fe load in progress through 8/21 5. Metabolic bone disease -  Ca 9.4 (10.5 corrected). Phos poorly controlled outpt on Phoslo 2 ac. Holding vit D for hypercalcemia. Low Ca bath. Try Fosrenol 6. Nutrition - NPO 7. RLE wounds/Cellulitis - S/p thromboembolectomy, fasciotomy and several debridements, [facility] has been following. Just finished course of clindamycin. Wound care per [facility] three times a week.  8. Carotid stenosis - [facility] following outpatient. (Rt 60-79%, Lt 80-99%).  9. Permanent HD access - AVF planned once carotids addressed per [facility] 10. Rt Axillary DVT - On heparin here  11. CAD - 3V disease/ no targets for revasc per cath 08/2013 12. Chronic systolic-diastolic CHF/ ICCM - EF 81-77% per echo 10/2013 13. DM - insulin per primary 14. [facility] following multiple issues - Will advise of her  admission in the am. Had outpatient appt tomorrow for follow-up of 7, 8, and [place], PA-C [place] Pager [phone] 11/25/2013, 4:00 PM   I have seen and examined this patient and agree with plan per Sonnie Alamo.  [phone] WF with new ESRD admitted with N/V and abd pain  CT shows gallstones and air that is either around the bladder or in it.  Abd exam currently is benign.  Urine shows TNTC wbc's.  Will add urine culture.  Plan on HD in AM.  Resume outpt meds. Bookert Guzzi T,MD 11/25/2013 8:27 PM

## 2013-11-25 NOTE — Consult Note (Signed)
I have seen this patient and examined her.  Her primary complaints are in the upper to mid abdomen, not the lower area or suprapubic area.  Her postprandial symptoms seem to go along with possible GB disease, but none of her findings are definitive.  She does not require any urgent surgery. I suggest a HIDA scan since the patient has known gallstones, an ultrasound may not show enough evidence of acute cholecystitis.  However, if the HIDA is positive one could make an argument for cholecystectomy.  The findings of air bubble around her bladder seem to be innocuous.  Perhaps they came from a small area of contained diverticular perforation, but there is minimal inflammation in that area.  Kathryne Eriksson. Dahlia Bailiff, MD, [employer] [phone] [facility] Surgery

## 2013-11-25 NOTE — ED Notes (Signed)
Pt c/o abdominal pain with N/V onset Friday. Pt reports ate at a restaurant and had dialysis. Pt dialysis days are MWF. Pt denies diarrhea or being exposed to others with illness.

## 2013-11-25 NOTE — H&P (Signed)
[facility] History and Physical  STORM DULSKI WCB:[id number] DOB: 1970-09-26 DOA: 11/25/2013  Referring physician: Dr. Wilson Singer PCP: Vic Blackbird, MD   Chief Complaint: abd pain, nausea and vomiting  HPI: Hannah [name] is a 43 y.o. female with significant past medical history including (but not limited to) HTN (with currently hypotension and need for midodrine), ESRD (HD M-W-F), HLD, CAD, chronic combined heart failure (last EF 25-30%), diabetes with neuropathy, GERD, cardiogenic shock (with IABP placement) and recent admission due to RLE cellulitis and supratherapeutic INR; came to Ed complaining of abdominal pain, nausea adn vomiting for the last 48 hours. Patient reports symptoms started on Friday night after dinner and course has continue worsening over the weekend. Patient reports associated fever (Temp max 100 at home; and afebrile in the ED) and anorexia. Patient denies CP, SOB, hematemesis, melena, constipation, diarrhea, dysuria or any other complaints.  Work up shows a normal white count, normal LFTs. VSS. A non contrast CT of abdomen and pelvis showed cholelithiasis, diverticulosis (without diverticulitis), normal appearing appendix and air surrounding the bladder. [facility] has been called and given constellation of medical problems [facility] has been asked to admit patient.  Review of Systems:  Negative except as otherwise mentioned on HPI.  Past Medical History  Diagnosis Date  . High cholesterol   . Diabetic retinopathy   . Peripheral neuropathy     "tips of toes"  . Blind right eye   . CHF (congestive heart failure)   . CAD (coronary artery disease)   . GERD (gastroesophageal reflux disease)   . Hypertension   . History of lung cancer 07/2011    s/p left lower lobectomy  . Diabetes mellitus     IDDM  . Cataract of right eye   . Ulcer of toe of right foot 07/10/2012    great toe  . Breast calcification, left 06/2012  . Acute biphenotypic leukemia   . CKD (chronic kidney  disease) stage 5, GFR less than 15 ml/min   . Nephrotic syndrome   . Gastritis     H/o gastritis on prior endoscopy  . Anemia   . Carotid artery disease    Past Surgical History  Procedure Laterality Date  . Cardiac catheterization  07/16/2011  . Incision and drainage breast abscess Left   . Tubal ligation  1994  . Vitrectomy  2010    2 on left, 1 on right  . Cesarean section  1991; 1994  . Video assisted thoracoscopy (vats)/ lobectomy Left 07/30/2011    left main thoracotomy, left lower lobectomy, mediastinal lymph node dissection  . Lobectomy    . Colonoscopy with esophagogastroduodenoscopy (egd) N/A 08/14/2012    Procedure: COLONOSCOPY WITH ESOPHAGOGASTRODUODENOSCOPY (EGD);  Surgeon: Danie Binder, MD;  [facility];  Service: Endoscopy;  Laterality: N/A;  10:45-moved to [address] to notify pt  . Breast lumpectomy with needle localization Left 11/14/2012    Procedure: BREAST LUMPECTOMY WITH NEEDLE LOCALIZATION;  Surgeon: Marcello Moores A. Cornett, MD;  Location: [facility];  Service: General;  Laterality: Left;  . Insertion of dialysis catheter Left 09/12/2013    Procedure: INSERTION OF DIALYSIS CATHETER;  Surgeon: Mal Misty, MD;  Location: [place];  Service: Vascular;  Laterality: Left;  . Embolectomy Right 09/17/2013    Procedure: Thrombectomy of Right Common Femoral Artery;  Surgeon: Elam Dutch, MD;  Location: [facility] OR;  Service: Vascular;  Laterality: Right;  . Endarterectomy femoral Right 09/17/2013    Procedure: Right Femoral Endarterectomy;  Surgeon: Elam Dutch, MD;  Location: [facility] OR;  Service: Vascular;  Laterality: Right;  . Patch angioplasty Right 09/17/2013    Procedure: Vein Patch Angioplasty of Right Femoral Artery;  Surgeon: Elam Dutch, MD;  Location: [place];  Service: Vascular;  Laterality: Right;  . Fasciotomy Right 09/17/2013    Procedure: Four Compartment Fasciotomy;  Surgeon: Elam Dutch, MD;  Location: [place];  Service: Vascular;  Laterality: Right;    . Fasciotomy closure Right 09/19/2013    Procedure: FASCIOTOMY CLOSURE;  Surgeon: Elam Dutch, MD;  Location: [place];  Service: Vascular;  Laterality: Right;  regional block and monitored anesthesia care used  . I&d extremity Right 09/28/2013    Procedure: IRRIGATION AND DEBRIDEMENT EXTREMITY;  Surgeon: Serafina Mitchell, MD;  Location: [facility] OR;  Service: Vascular;  Laterality: Right;  . Application of wound vac Right 09/28/2013    Procedure: APPLICATION OF WOUND VAC;  Surgeon: Serafina Mitchell, MD;  Location: [facility] OR;  Service: Vascular;  Laterality: Right;   Social History:  reports that she quit smoking about 2 years ago. She has never used smokeless tobacco. She reports that she does not drink alcohol or use illicit drugs.  Allergies  Allergen Reactions  . Crestor [Rosuvastatin] Other (See Comments)    Severe muscle weakness  . Nsaids Other (See Comments)    Not allergic, "bad on my kidneys"  . Ciprofloxacin Rash    Family History  Problem Relation Age of Onset  . Coronary artery disease Father   . Asthma Father   . COPD Father   . Hypertension Father   . Hyperlipidemia Father   . Diabetes Father   . Congestive Heart Failure Father   . Heart disease Father     before age 68  . Heart attack Father   . Peripheral vascular disease Father   . Hypertension Mother   . Hyperlipidemia Mother   . Diabetes Mother   . Cancer Mother   . Cancer Maternal Aunt      three aunts, bone, breast, ?  . Hypertension Brother   . Diabetes Brother   . Diabetes Sister   . Colon cancer Neg Hx   . Celiac disease Neg Hx   . Crohn's disease Neg Hx   . Ulcerative colitis Neg Hx   . Lung cancer Maternal Grandmother      Prior to Admission medications   Medication Sig Start Date End Date Taking? Authorizing Provider  acetaminophen (TYLENOL) 500 MG tablet Take 1,000 mg by mouth every 8 (eight) hours as needed for mild pain.    Yes Historical Provider, MD  aspirin EC 81 MG EC tablet Take 1 tablet (81  mg total) by mouth daily. 08/17/13  Yes Belkys A Regalado, MD  atorvastatin (LIPITOR) 80 MG tablet Take 1 tablet (80 mg total) by mouth daily at 6 PM. 10/29/13  Yes Amy D Clegg, NP  calcium acetate (PHOSLO) 667 MG capsule Take 2 capsules (1,334 mg total) by mouth 3 (three) times daily with meals. 11/10/13  Yes Hosie Poisson, MD  cyclobenzaprine (FLEXERIL) 10 MG tablet Take 10 mg by mouth 3 (three) times daily as needed for muscle spasms.   Yes Historical Provider, MD  Insulin Detemir (LEVEMIR FLEXPEN) 100 UNIT/ML Pen Inject 20 Units into the skin every evening. 10/29/13  Yes Amy D Clegg, NP  insulin lispro (HUMALOG KWIKPEN) 100 UNIT/ML KiwkPen Inject 0.05 mLs (5 Units total) into the skin 3 (three) times daily with meals. 10/29/13  Yes Amy D Clegg, NP  methylcellulose (ARTIFICIAL TEARS) 1 % ophthalmic solution Place 1 drop into both eyes at bedtime as needed (dry eyes).   Yes Historical Provider, MD  midodrine (PROAMATINE) 10 MG tablet Take 1 tablet (10 mg total) by mouth 3 (three) times daily with meals. 10/29/13  Yes Amy D Clegg, NP  multivitamin (RENA-VIT) TABS tablet Take 1 tablet by mouth daily.   Yes Historical Provider, MD  ondansetron (ZOFRAN) 4 MG tablet Take 1 tablet (4 mg total) by mouth every 8 (eight) hours as needed for nausea or vomiting. 10/29/13  Yes Amy D Clegg, NP  pantoprazole (PROTONIX) 40 MG tablet Take 1 tablet (40 mg total) by mouth daily. 10/29/13  Yes Amy D Clegg, NP  polyethylene glycol (MIRALAX / GLYCOLAX) packet Take 17 g by mouth daily as needed (constipation). 10/29/13  Yes Amy D Clegg, NP  warfarin (COUMADIN) 1 MG tablet Take 1 mg by mouth See admin instructions. Take 1mg  on Friday and Saturday. Sunday take 0.5mg .   Yes Historical Provider, MD   Physical Exam: Filed Vitals:   11/25/13 1330 11/25/13 1345 11/25/13 1400 11/25/13 1509  BP: 103/62 108/74 107/62 115/86  Pulse: 95 93 95 98  Temp:      TempSrc:      Resp: 12 11 17 12   Height:      Weight:      SpO2: 96% 94% 95%  90%    Wt Readings from Last 3 Encounters:  11/25/13 120.203 kg (265 lb)  11/09/13 123.469 kg (272 lb 3.2 oz)  11/06/13 120.657 kg (266 lb)    General:  Appears calm and comfortable; pain is well controlled at this time. No fever. Denies SOB or CP Eyes: blind of right eye; normal lids, irises & conjunctiva, no icterus, no nystagmus ENT: grossly normal hearing, lips & tongue WNL, MMM; no erythema or exudates inside her mouth. No drainage out of ears or nostrils Neck: no LAD, masses or thyromegaly, no appreciated JVD (but difficult to evaluate given body habitus) Cardiovascular: RRR, no rubs or gallops Respiratory: CTA bilaterally, no w/r/r. Normal respiratory effort. Abdomen: soft, no rebound, no guarding; mild discomfort in mid abdomen with deep palpation; positive BS Skin:RLE with mild erythema and open wound; no purulent discharge; no other skin abnormalities appreciated. Musculoskeletal: grossly normal tone BUE/BLE; 1-2++ edema bilaterally affecting Le's Psychiatric: grossly normal mood and affect, speech fluent and appropriate Neurologic: no new focal motor or neurologic deficit.          Labs on Admission:  Basic Metabolic Panel:  Recent Labs Lab 11/25/13 0903  NA 139  K 5.1  CL 96  CO2 28  GLUCOSE 152*  BUN 40*  CREATININE 5.30*  CALCIUM 9.4   Liver Function Tests:  Recent Labs Lab 11/25/13 0903  AST 24  ALT 19  ALKPHOS 100  BILITOT 0.5  PROT 6.1  ALBUMIN 2.6*    Recent Labs Lab 11/25/13 0903  LIPASE 32   CBC:  Recent Labs Lab 11/25/13 0903  WBC 6.2  NEUTROABS 4.3  HGB 9.1*  HCT 30.3*  MCV 91.5  PLT 341   BNP (last 3 results)  Recent Labs  08/13/13 1949 09/06/13 0740  PROBNP 34187.0* 77824.2*   Radiological Exams on Admission: Ct Abdomen Pelvis Wo Contrast  11/25/2013   CLINICAL DATA:  Diffuse abdominal pain. Nausea, vomiting since Friday. Dialysis patient. History of CHF, CAD, GERD, hypertension, diabetes. History of lung cancer,  chronic kidney disease, gastritis. Previous tubal ligation and  lobectomy. Recent cardiac catheterization.  EXAM: CT ABDOMEN AND PELVIS WITHOUT CONTRAST  TECHNIQUE: Multidetector CT imaging of the abdomen and pelvis was performed following the standard protocol without IV contrast.  COMPARISON:  Ultrasound the abdomen 12/05/2012 and 06/01/2011 and PET-CT on 07/28/2011  FINDINGS: Lower chest: There bilateral pleural effusions. Heart is mildly enlarged. There are coronary artery calcifications. Trace pericardial effusion is present.  Upper abdomen: No focal abnormality identified within the liver, is clean spleen, pancreas, or adrenal glands. A left renal cyst is 5.6 cm. There are calcified dependent gallstones within the gallbladder. No other evidence for acute cholecystitis.  Bowel: The stomach and small bowel loops have a normal appearance. The appendix is well seen and has a normal appearance. There are numerous colonic diverticula but no evidence for acute diverticulitis.  Pelvis: There are small locules of gas surrounding the urinary bladder. The may be a small amount of gas within the dome of the bladder. Question of recent catheterization or other bladder procedure. No pelvic abscess. The uterus is present. No adnexal mass.Small amount of free fluid.  Abdominal wall: There is soft tissue defect in the right lower quadrant consistent with recent surgery. There is diffuse body wall edema. Small right inguinal lymph nodes are noted. These may be reactive.  Osseous structures: Unremarkable.  IMPRESSION: 1. Small amount of gas surrounding the urinary bladder, suspicious for infection or perforation of the urinary bladder. Question of any recent bladder procedures. 2. Soft tissue defect in the right lower quadrant of the abdomen consistent with recent surgery. 3. Body wall edema. 4. Cardiomegaly. 5. Coronary vascular calcifications. 6. Trace pericardial effusion. 7. Cholelithiasis. 8. Left renal cyst. The findings  were discussed with Virgie Dad on 11/25/2013 at 12:16 pm.   Electronically Signed   By: Shon Hale M.D.   On: 11/25/2013 12:18    EKG: Ordered and pending on admission.  Assessment/Plan 1-Abdominal pain of unknown etiology: present for the last 48 hours and worsening by patient description. No fever, no WBC's. Positive cholelithiasis by CT and free air surrounding bladder. Presumed to be secondary to biliary colic her pain; small amount of air probably from microperforation from diverticulosis. -non toxic appearance and stable vital signs -will admit to med-surg bed -cool off process with IV antibiotics -[facility] service consulted and on board; will follow rec's -Hida scan ordered by surgery will follow results -will keep NPO -cardiology has been contacted and will see patient and assist as mucha s needed in presence of surgery -lipase is normal  2-Adenocarcinoma of lung: s/p post lobectomy in 2013 -stable. No SOB  3-Diabetic retinopathy associated with type 2 diabetes mellitus: will continue levemir (lower dose as patient is NPO); -started also on SSI  4-Hyperlipidemia: continue lipitor  5-GERD (gastroesophageal reflux disease): continue PPI  6-ESRD (end stage renal disease) on dialysis: renal service contacted and plan is to continue HD treatemnt M-W-F  7-Chronic combined systolic and diastolic CHF, NYHA class 3: stable and compensated at this time. -volume control by HD -strict I's and O's -daily weights  8-Nausea & vomiting: due to #1. -will treat with PRN antiemetics   9-Anemia of chronic disease: Hgb stable. -IV iron and aranesp as per renal service discretion  10-hyperparathyroidism: continue phoslo  11-hypotension: continue midodrine  12-recent DVT: will hold coumadin and use heparin in case surgery is needed  Renal service (Dr. Mercy Moore) [facility] (contacted by ED physician)  Code Status: Full DVT Prophylaxis:heparin Family Communication: husband at  bedside Disposition Plan: inpatient, LOS > 2  midnights; med-surg bed  Time spent: 50 minutes  Barton Dubois [facility] Pager [phone]  **Disclaimer: This note may have been dictated with voice recognition software. Similar sounding words can inadvertently be transcribed and this note may contain transcription errors which may not have been corrected upon publication of note.**

## 2013-11-25 NOTE — ED Provider Notes (Signed)
12:30 PM  Medical screening examination/treatment/procedure(s) were conducted as a shared visit with non-physician practitioner(s) and myself.  I personally evaluated the patient during the encounter.   EKG Interpretation None       43yf with abdominal pain. Lengthy recent hospitalization and critically ill. Reports gradual onset and progressively worsening abdominal pain for past two days. On exam her abdomen is soft. Exam somewhat limited by body habitus. Some mild diffuse tenderness. I cannot localize it to one particular area. No rebound/guarding/distension. R lower abdominal/groin wound appears to be healing ok. Granulation tissue at base. CT with small amount of air surrounding bladder of unclear etiology? Non contrasted CT. No obvious collection concerning for abscess. Not obvious diverticulitis. Possible extension from R groin procedure? Does not seem like she has any bladder instrumentation aside from urinary catheterization during hospitalization. Will order empiric abx. Will discuss with surgery and potentially urology/vascular surgery.   12:56 PM Discussed with Dr Alinda Money, urology. He reviewed imaging. Suspects air not from urologic process. Happy to discuss with surgery further if they feel appropriate. Recommending placing foley and checking UA.   Virgel Manifold, MD 11/29/13 [phone]

## 2013-11-25 NOTE — Progress Notes (Signed)
Pt admitted from ED to [facility]. Dr. Dyann Kief notified of pt room status, and provided contact number for radiologist [phone] to discuss NM hepatobiliary study. VSS stable; pain rated 1/10. Pt alert and oriented. No pressure wounds, however, wounds on lateral medial lower right leg and r groin/lower abdominal area with guazed dressing intact. Foley intact; orders received to discontinue. Attempted to view patient safety video, however, video service not available. Discussed safety measures, fall contract, and bed alarm. Pt agrees to all measures in place for high fall risk. No telemetry ordered. NPO status. General medical pathway for abdominal pain initiated. Continue to monitor. Manya Silvas, RN

## 2013-11-25 NOTE — Progress Notes (Signed)
ANTIBIOTIC CONSULT NOTE - INITIAL  Pharmacy Consult for Zosyn Indication: Empiric coverage for intra-abd infection  Allergies  Allergen Reactions  . Crestor [Rosuvastatin] Other (See Comments)    Severe muscle weakness  . Nsaids Other (See Comments)    Not allergic, "bad on my kidneys"  . Ciprofloxacin Rash    Patient Measurements: Height: 5\' 7"  (170.2 cm) Weight: 265 lb (120.203 kg) IBW/kg (Calculated) : 61.6  Vital Signs: Temp: 97.8 F (36.6 C) (08/09 0837) Temp src: Oral (08/09 0837) BP: 115/86 mmHg (08/09 1509) Pulse Rate: 98 (08/09 1509) Intake/Output from previous day:   Intake/Output from this shift:    Labs:  Recent Labs  11/25/13 0903  WBC 6.2  HGB 9.1*  PLT 341  CREATININE 5.30*   Estimated Creatinine Clearance: 18 ml/min (by C-G formula based on Cr of 5.3). No results found for this basename: VANCOTROUGH, VANCOPEAK, VANCORANDOM, GENTTROUGH, GENTPEAK, GENTRANDOM, TOBRATROUGH, TOBRAPEAK, TOBRARND, AMIKACINPEAK, AMIKACINTROU, AMIKACIN,  in the last 72 hours   Microbiology: Recent Results (from the past 720 hour(s))  CULTURE, BLOOD (ROUTINE X 2)     Status: None   Collection Time    11/06/13 11:15 AM      Result Value Ref Range Status   Specimen Description BLOOD RIGHT ANTECUBITAL   Final   Special Requests BOTTLES DRAWN AEROBIC AND ANAEROBIC 5CC   Final   Culture  Setup Time     Final   Value: 11/06/2013 17:12     Performed at [facility]   Culture     Final   Value: NO GROWTH 5 DAYS     Performed at [facility]   Report Status 11/12/2013 FINAL   Final  CULTURE, BLOOD (ROUTINE X 2)     Status: None   Collection Time    11/06/13 11:30 AM      Result Value Ref Range Status   Specimen Description BLOOD RIGHT FOREARM   Final   Special Requests BOTTLES DRAWN AEROBIC AND ANAEROBIC 4CC   Final   Culture  Setup Time     Final   Value: 11/06/2013 17:12     Performed at [facility]   Culture     Final   Value: NO GROWTH 5  DAYS     Performed at [facility]   Report Status 11/12/2013 FINAL   Final  MRSA PCR SCREENING     Status: None   Collection Time    11/06/13  7:45 PM      Result Value Ref Range Status   MRSA by PCR NEGATIVE  NEGATIVE Final   Comment:            The GeneXpert MRSA Assay (FDA     approved for NASAL specimens     only), is one component of a     comprehensive MRSA colonization     surveillance program. It is not     intended to diagnose MRSA     infection nor to guide or     monitor treatment for     MRSA infections.    Medical History: Past Medical History  Diagnosis Date  . High cholesterol   . Diabetic retinopathy   . Peripheral neuropathy     "tips of toes"  . Blind right eye   . CHF (congestive heart failure)   . CAD (coronary artery disease)   . GERD (gastroesophageal reflux disease)   . Hypertension   . History of lung cancer 07/2011  s/p left lower lobectomy  . Diabetes mellitus     IDDM  . Cataract of right eye   . Ulcer of toe of right foot 07/10/2012    great toe  . Breast calcification, left 06/2012  . Acute biphenotypic leukemia   . CKD (chronic kidney disease) stage 5, GFR less than 15 ml/min   . Nephrotic syndrome   . Gastritis     H/o gastritis on prior endoscopy  . Anemia   . Carotid artery disease     Assessment: 28 YOF well known to pharmacy with recent extended hospital stay from May-July with complications including PEA arrest, cardiogenic shock, HF exacerbation, AoCKD >> ESRD. The patient presented to the [facility] today (8/9) with abdominal pain, N/V x 2 days - imaging revealed possible perforation (diverticular vs bladder) - awaiting HIDA scan. Pharmacy consulted to start Zosyn for empiric abdominal coverage. Pt known to be ESRD.  Goal of Therapy:  Proper antibiotics for infection/cultures adjusted for renal/hepatic function   Plan:  1. Zosyn 3.375g x 1 to load (also available in the ED pyxis machine) followed by 2.25g IV every 8  hours 2. Will continue to follow renal function, culture results, LOT, and antibiotic de-escalation plans   Alycia Rossetti, PharmD, BCPS Clinical Pharmacist Pager: [phone] 11/25/2013 3:54 PM

## 2013-11-25 NOTE — Consult Note (Signed)
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  She was hospitalized 09/03/13-10/29/13 due to PEA arrest, cardiogenic shock, IABP placement, right heart catheterization,  RLE ichemia requiring right femoral endarterectomy, thrombectomy and fasciotomies(09/17/13) by Dr. Oneida Alar, subsequent wound debridement (09/28/13).  She was subsequently discharged home and readmitted again 11/06/13-11/10/12 with cellulitis of RLE, supra-therapeutic INR.  She presents today with abdominal pain, nausea and vomiting.  Duration of symptoms is 2 days.  Onset was sudden.  Coarse is worsening.  It began several hours after eating flounder and shrimp for dinner.  Location is in the epigastric region with radiation bilaterally.  Symptoms were constant.  No aggravating or alleviating factors.  No modifying factors.  Associated with fever or 100, nausea and vomiting.  She denies previous symptoms.  Denies melena or hematochezia.  Denies constipation or diarrhea.  Appetite has been adequate until the onset of symptoms.  Last oral intake was sometimes yesterday.    Work up shows a normal white count, normal LFTs.  She is afebrile.  VSS.  A non contrast(pt on dialysis) CT of abdomen and pelvis showed cholelithiasis, diverticulosis without diverticulitis, normal appearing appendix and air surrounding the bladder.  We have therefore been asked to evaluate the patient.   Past Medical History  Diagnosis Date  . High cholesterol   . Diabetic retinopathy   . Peripheral neuropathy     "tips of toes"  . Blind right eye   . CHF (congestive heart failure)   . CAD (coronary artery disease)   . GERD (gastroesophageal reflux disease)   . Hypertension   . History of lung cancer 07/2011     s/p left lower lobectomy  . Diabetes mellitus     IDDM  . Cataract of right eye   . Ulcer of toe of right foot 07/10/2012    great toe  . Breast calcification, left 06/2012  . Acute biphenotypic leukemia   . CKD (chronic kidney disease) stage 5, GFR less than 15 ml/min   . Nephrotic syndrome   . Gastritis     H/o gastritis on prior endoscopy  . Anemia   . Carotid artery disease     Past Surgical History  Procedure Laterality Date  . Cardiac catheterization  07/16/2011  . Incision and drainage breast abscess Left   . Tubal ligation  1994  . Vitrectomy  2010    2 on left, 1 on right  . Cesarean section  1991; 1994  . Video assisted thoracoscopy (vats)/ lobectomy Left 07/30/2011    left main thoracotomy, left lower lobectomy, mediastinal lymph node dissection  . Lobectomy    . Colonoscopy with esophagogastroduodenoscopy (egd) N/A 08/14/2012    Procedure: COLONOSCOPY WITH ESOPHAGOGASTRODUODENOSCOPY (EGD);  Surgeon: Danie Binder, MD;  Location: [facility];  Service: Endoscopy;  Laterality: N/A;  10:45-moved to [address] to notify pt  . Breast lumpectomy with needle localization Left 11/14/2012    Procedure: BREAST LUMPECTOMY WITH NEEDLE LOCALIZATION;  Surgeon: Marcello Moores A. Cornett, MD;  Location: [place];  Service: General;  Laterality: Left;  . Insertion of dialysis catheter Left 09/12/2013    Procedure: INSERTION OF DIALYSIS CATHETER;  Surgeon: Mal Misty, MD;  Location: [place];  Service: Vascular;  Laterality: Left;  .  Embolectomy Right 09/17/2013    Procedure: Thrombectomy of Right Common Femoral Artery;  Surgeon: Elam Dutch, MD;  Location: [facility] OR;  Service: Vascular;  Laterality: Right;  . Endarterectomy femoral Right 09/17/2013    Procedure: Right Femoral Endarterectomy;  Surgeon: Elam Dutch, MD;  Location: [facility] OR;  Service: Vascular;  Laterality: Right;  . Patch angioplasty Right 09/17/2013    Procedure: Vein Patch Angioplasty of Right Femoral Artery;  Surgeon: Elam Dutch, MD;  Location: [place];  Service: Vascular;  Laterality: Right;  . Fasciotomy Right 09/17/2013    Procedure: Four Compartment Fasciotomy;  Surgeon: Elam Dutch, MD;  Location: [place];  Service: Vascular;  Laterality: Right;  . Fasciotomy closure Right 09/19/2013    Procedure: FASCIOTOMY CLOSURE;  Surgeon: Elam Dutch, MD;  Location: [place];  Service: Vascular;  Laterality: Right;  regional block and monitored anesthesia care used  . I&d extremity Right 09/28/2013    Procedure: IRRIGATION AND DEBRIDEMENT EXTREMITY;  Surgeon: Serafina Mitchell, MD;  Location: [facility] OR;  Service: Vascular;  Laterality: Right;  . Application of wound vac Right 09/28/2013    Procedure: APPLICATION OF WOUND VAC;  Surgeon: Serafina Mitchell, MD;  Location: [facility] OR;  Service: Vascular;  Laterality: Right;    Family History  Problem Relation Age of Onset  . Coronary artery disease Father   . Asthma Father   . COPD Father   . Hypertension Father   . Hyperlipidemia Father   . Diabetes Father   . Congestive Heart Failure Father   . Heart disease Father     before age 61  . Heart attack Father   . Peripheral vascular disease Father   . Hypertension Mother   . Hyperlipidemia Mother   . Diabetes Mother   . Cancer Mother   . Cancer Maternal Aunt      three aunts, bone, breast, ?  . Hypertension Brother   . Diabetes Brother   . Diabetes Sister   . Colon cancer Neg Hx   . Celiac disease Neg Hx   . Crohn's disease Neg Hx   . Ulcerative colitis Neg Hx   . Lung cancer Maternal Grandmother     Social History:  reports that she quit smoking about 2 years ago. She has never used smokeless tobacco. She reports that she does not drink alcohol or use illicit drugs.  Allergies:  Allergies  Allergen Reactions  . Crestor [Rosuvastatin] Other (See Comments)    Severe muscle weakness  . Nsaids Other (See Comments)    Not allergic, "bad on my kidneys"  . Ciprofloxacin Rash    Medications:  Scheduled Meds:   Continuous Infusions: . piperacillin-tazobactam (ZOSYN)  IV 3.375 g (11/25/13 1243)   PRN Meds:.iohexol   Results for orders placed during the hospital encounter of 11/25/13 (from the past 48 hour(s))  COMPREHENSIVE METABOLIC PANEL     Status: Abnormal   Collection Time    11/25/13  9:03 AM      Result Value Ref Range   Sodium 139  137 - 147 mEq/L   Potassium 5.1  3.7 - 5.3 mEq/L   Chloride 96  96 - 112 mEq/L   CO2 28  19 - 32 mEq/L   Glucose, Bld 152 (*) 70 - 99 mg/dL   BUN 40 (*) 6 - 23 mg/dL   Creatinine, Ser 5.30 (*) 0.50 - 1.10 mg/dL   Calcium 9.4  8.4 - 10.5 mg/dL   Total Protein 6.1  6.0 - 8.3 g/dL   Albumin 2.6 (*) 3.5 - 5.2 g/dL   AST 24  0 - 37 U/L   ALT 19  0 - 35 U/L   Alkaline Phosphatase 100  39 - 117 U/L   Total Bilirubin 0.5  0.3 - 1.2 mg/dL   GFR calc non Af Amer 9 (*) >90 mL/min   GFR calc Af Amer 10 (*) >90 mL/min   Comment: (NOTE)     The eGFR has been calculated using the CKD EPI equation.     This calculation has not been validated in all clinical situations.     eGFR's persistently <90 mL/min signify possible Chronic Kidney     Disease.   Anion gap 15  5 - 15  LIPASE, BLOOD     Status: None   Collection Time    11/25/13  9:03 AM      Result Value Ref Range   Lipase 32  11 - 59 U/L  CBC WITH DIFFERENTIAL     Status: Abnormal   Collection Time    11/25/13  9:03 AM      Result Value Ref Range   WBC 6.2  4.0 - 10.5 K/uL   RBC 3.31 (*) 3.87 - 5.11 MIL/uL   Hemoglobin 9.1 (*) 12.0 - 15.0 g/dL   HCT 30.3 (*) 36.0 - 46.0 %   MCV 91.5  78.0 - 100.0 fL   MCH 27.5  26.0 - 34.0 pg   MCHC 30.0  30.0 - 36.0 g/dL   RDW 20.2 (*) 11.5 - 15.5 %   Platelets 341  150 - 400 K/uL   Neutrophils Relative % 70  43 - 77 %   Neutro Abs 4.3  1.7 - 7.7 K/uL   Lymphocytes Relative 17  12 - 46 %   Lymphs Abs 1.0  0.7 - 4.0 K/uL   Monocytes Relative 7  3 - 12 %   Monocytes Absolute 0.4  0.1 - 1.0 K/uL   Eosinophils Relative 5  0 - 5 %   Eosinophils Absolute 0.3  0.0  - 0.7 K/uL   Basophils Relative 1  0 - 1 %   Basophils Absolute 0.1  0.0 - 0.1 K/uL  PROTIME-INR     Status: Abnormal   Collection Time    11/25/13 10:38 AM      Result Value Ref Range   Prothrombin Time 15.6 (*) 11.6 - 15.2 seconds   INR 1.24  0.00 - 1.49    Ct Abdomen Pelvis Wo Contrast  11/25/2013   CLINICAL DATA:  Diffuse abdominal pain. Nausea, vomiting since Friday. Dialysis patient. History of CHF, CAD, GERD, hypertension, diabetes. History of lung cancer, chronic kidney disease, gastritis. Previous tubal ligation and lobectomy. Recent cardiac catheterization.  EXAM: CT ABDOMEN AND PELVIS WITHOUT CONTRAST  TECHNIQUE: Multidetector CT imaging of the abdomen and pelvis was performed following the standard protocol without IV contrast.  COMPARISON:  Ultrasound the abdomen 12/05/2012 and 06/01/2011 and PET-CT on 07/28/2011  FINDINGS: Lower chest: There bilateral pleural effusions. Heart is mildly enlarged. There are coronary artery calcifications. Trace pericardial effusion is present.  Upper abdomen: No focal abnormality identified within the liver, is clean spleen, pancreas, or adrenal glands. A left renal cyst is 5.6 cm. There are calcified dependent gallstones within the gallbladder. No other evidence for acute cholecystitis.  Bowel: The stomach and small bowel loops have a normal appearance. The appendix is well seen and has a normal appearance. There are numerous colonic diverticula  but no evidence for acute diverticulitis.  Pelvis: There are small locules of gas surrounding the urinary bladder. The may be a small amount of gas within the dome of the bladder. Question of recent catheterization or other bladder procedure. No pelvic abscess. The uterus is present. No adnexal mass.Small amount of free fluid.  Abdominal wall: There is soft tissue defect in the right lower quadrant consistent with recent surgery. There is diffuse body wall edema. Small right inguinal lymph nodes are noted. These may  be reactive.  Osseous structures: Unremarkable.  IMPRESSION: 1. Small amount of gas surrounding the urinary bladder, suspicious for infection or perforation of the urinary bladder. Question of any recent bladder procedures. 2. Soft tissue defect in the right lower quadrant of the abdomen consistent with recent surgery. 3. Body wall edema. 4. Cardiomegaly. 5. Coronary vascular calcifications. 6. Trace pericardial effusion. 7. Cholelithiasis. 8. Left renal cyst. The findings were discussed with Virgie Dad on 11/25/2013 at 12:16 pm.   Electronically Signed   By: Shon Hale M.D.   On: 11/25/2013 12:18    Review of Systems  All other systems reviewed and are negative.  Blood pressure 106/64, pulse 103, temperature 97.8 F (36.6 C), temperature source Oral, resp. rate 29, height 5' 7"  (1.702 m), weight 265 lb (120.203 kg), last menstrual period 09/18/2013, SpO2 98.00%. Physical Exam  Constitutional: She is oriented to person, place, and time. She appears well-developed and well-nourished. No distress.  HENT:  Head: Normocephalic and atraumatic.  Neck: Normal range of motion. Neck supple.  Cardiovascular: Normal rate, regular rhythm, normal heart sounds and intact distal pulses.  Exam reveals no gallop and no friction rub.   No murmur heard. Respiratory: Effort normal and breath sounds normal.  GI: Soft. Bowel sounds are normal. She exhibits no distension and no mass. There is no rebound and no guarding.  Mild TTP to the pelvic region.   No TTP to upper abdomen, negative murphy's sign  Musculoskeletal: Normal range of motion.  +2-3 pitting edema to BLE  Lymphadenopathy:    She has no cervical adenopathy.  Neurological: She is alert and oriented to person, place, and time.  Skin: Skin is warm and dry. No rash noted. She is not diaphoretic. No erythema. No pallor.  RLQ dressing is c/d/i  Psychiatric: She has a normal mood and affect. Her behavior is normal. Judgment and thought content normal.     Assessment/Plan: Type I diabetes mellitus ESRD on dialysis MWF Lung CA s/p LLL lobectomy 2013 Anemia  CAD Systolic and diastolic heart failure EF 25-30% Hospitalized 09/03/13-10/29/13 PEA arrest, cardiogenic shock, RLE ischemia S/p Rt femoral endarterectomy, thrombectomy and fasciotomies (09/17/13), then closure, wound debridement, open wound to RLQ Coumadin therapy(INR 1.24) Asymptomatic cholelithiasis   (Principal problems) Abdominal pain, nausea and vomiting Free air surrounding urinary bladder  She does not have a white count, no peritoneal signs on my physical exam that warrant urgent surgical intervention.    Etiology of symptoms is unclear, Im not quite sure why she would have a perforation could be occult diverticular perforation, but there is no evidence of an abscess.  Dr. Alinda Money was consulted for questionable bladder perforation but did not feel like this was a urologic process.  Would recommend hospitalist admission.  Keep her NPO for now, IVF, IV antibiotics and hold coumadin.  Given epigastric pain and known cholelithiasis we will proceed with a HIDA scan. Will await UA results.  Surgery wiill follow along.    Hazelyn Kallen ANP-BC 11/25/2013,  1:22 PM

## 2013-11-25 NOTE — ED Notes (Signed)
NM Hepatobiliary scan not completed error in clicking off test floor notified

## 2013-11-25 NOTE — ED Provider Notes (Signed)
CSN: [id number]     Arrival date & time 11/25/13  0827 History   First MD Initiated Contact with Patient 11/25/13 [phone]     Chief Complaint  Patient presents with  . Abdominal Pain     (Consider location/radiation/quality/duration/timing/severity/associated sxs/prior Treatment) HPI  Patient brought to the ED by family member for severe abdominal pain that started on Friday and has been gradually worsening with nauea and vomiting. She has a very complicated PMH with significant events that have taken place in the last few months.   "She is a 43 yo female with  DM1, stage V kidney disease, blindness, h/o lung CA s/p resection who was recently discharged for heart failure symptom had a high risk stress test which showed ischemia in anterior and apical region. She underwent cath 09/04/2013 which showed chronically occluded RCA and triple vessel dx. CT surgery consulted but not felt to be surgical candidate due to poor PFTs, suboptimal targets, comorbidities and shock. EF 22% by Myoview. 30-35% by echo  Underwent placement of swan and IABP for shock (co-ox 38%). Unfortunately developed cold foot and IABP had to be removed.Underwent R femoral thrombectomy and 4 compartment fasciotomy of RLE on 6/1.On 6/3 had VT/VF->PEA arrest 10 mins CPR. U/s shows R axillary DVT. " in June 2015  She describes her abdominal pain as diffuse with intermittent sharp pains. The pain is severe. It does not radiate. She denies diarrhea, constipation, vaginal bleeding/discharge, rash or any other associated symptom at this time.   Past Medical History  Diagnosis Date  . High cholesterol   . Diabetic retinopathy   . Peripheral neuropathy     "tips of toes"  . Blind right eye   . CHF (congestive heart failure)   . CAD (coronary artery disease)   . GERD (gastroesophageal reflux disease)   . Hypertension   . History of lung cancer 07/2011    s/p left lower lobectomy  . Diabetes mellitus     IDDM  . Cataract of right  eye   . Ulcer of toe of right foot 07/10/2012    great toe  . Breast calcification, left 06/2012  . Acute biphenotypic leukemia   . CKD (chronic kidney disease) stage 5, GFR less than 15 ml/min   . Nephrotic syndrome   . Gastritis     H/o gastritis on prior endoscopy  . Anemia   . Carotid artery disease    Past Surgical History  Procedure Laterality Date  . Cardiac catheterization  07/16/2011  . Incision and drainage breast abscess Left   . Tubal ligation  1994  . Vitrectomy  2010    2 on left, 1 on right  . Cesarean section  1991; 1994  . Video assisted thoracoscopy (vats)/ lobectomy Left 07/30/2011    left main thoracotomy, left lower lobectomy, mediastinal lymph node dissection  . Lobectomy    . Colonoscopy with esophagogastroduodenoscopy (egd) N/A 08/14/2012    Procedure: COLONOSCOPY WITH ESOPHAGOGASTRODUODENOSCOPY (EGD);  Surgeon: Danie Binder, MD;  Location: [facility];  Service: Endoscopy;  Laterality: N/A;  10:45-moved to [address] to notify pt  . Breast lumpectomy with needle localization Left 11/14/2012    Procedure: BREAST LUMPECTOMY WITH NEEDLE LOCALIZATION;  Surgeon: Marcello Moores A. Cornett, MD;  Location: [place];  Service: General;  Laterality: Left;  . Insertion of dialysis catheter Left 09/12/2013    Procedure: INSERTION OF DIALYSIS CATHETER;  Surgeon: Mal Misty, MD;  Location: [place];  Service: Vascular;  Laterality: Left;  .  Embolectomy Right 09/17/2013    Procedure: Thrombectomy of Right Common Femoral Artery;  Surgeon: Elam Dutch, MD;  Location: [facility] OR;  Service: Vascular;  Laterality: Right;  . Endarterectomy femoral Right 09/17/2013    Procedure: Right Femoral Endarterectomy;  Surgeon: Elam Dutch, MD;  Location: [facility] OR;  Service: Vascular;  Laterality: Right;  . Patch angioplasty Right 09/17/2013    Procedure: Vein Patch Angioplasty of Right Femoral Artery;  Surgeon: Elam Dutch, MD;  Location: [place];  Service: Vascular;  Laterality: Right;  .  Fasciotomy Right 09/17/2013    Procedure: Four Compartment Fasciotomy;  Surgeon: Elam Dutch, MD;  Location: [place];  Service: Vascular;  Laterality: Right;  . Fasciotomy closure Right 09/19/2013    Procedure: FASCIOTOMY CLOSURE;  Surgeon: Elam Dutch, MD;  Location: [place];  Service: Vascular;  Laterality: Right;  regional block and monitored anesthesia care used  . I&d extremity Right 09/28/2013    Procedure: IRRIGATION AND DEBRIDEMENT EXTREMITY;  Surgeon: Serafina Mitchell, MD;  Location: [facility] OR;  Service: Vascular;  Laterality: Right;  . Application of wound vac Right 09/28/2013    Procedure: APPLICATION OF WOUND VAC;  Surgeon: Serafina Mitchell, MD;  Location: [facility] OR;  Service: Vascular;  Laterality: Right;   Family History  Problem Relation Age of Onset  . Coronary artery disease Father   . Asthma Father   . COPD Father   . Hypertension Father   . Hyperlipidemia Father   . Diabetes Father   . Congestive Heart Failure Father   . Heart disease Father     before age 20  . Heart attack Father   . Peripheral vascular disease Father   . Hypertension Mother   . Hyperlipidemia Mother   . Diabetes Mother   . Cancer Mother   . Cancer Maternal Aunt      three aunts, bone, breast, ?  . Hypertension Brother   . Diabetes Brother   . Diabetes Sister   . Colon cancer Neg Hx   . Celiac disease Neg Hx   . Crohn's disease Neg Hx   . Ulcerative colitis Neg Hx   . Lung cancer Maternal Grandmother    History  Substance Use Topics  . Smoking status: Former Smoker -- 2.00 packs/day for 30 years    Quit date: 08/01/2011  . Smokeless tobacco: Never Used  . Alcohol Use: No   OB History   Grav Para Term Preterm Abortions TAB SAB Ect Mult Living                 Review of Systems   Review of Systems  Gen: no weight loss, fevers, chills, night sweats  Eyes: no discharge or drainage, no occular pain or visual changes  Nose: no epistaxis or rhinorrhea  Mouth: no dental pain, no sore throat    Neck: no neck pain  Lungs:No wheezing or hemoptysis No coughing CV:  No palpitations, dependent edema or orthopnea. No chest pain Abd: no diarrhea. + nausea or vomiting and abdominal pain  GU: no dysuria or gross hematuria  MSK:  No muscle weakness, No  pain Neuro: no headache, no focal neurologic deficits  Skin: no rash , no wounds Psyche: no complaints    Allergies  Crestor; Nsaids; and Ciprofloxacin  Home Medications   Prior to Admission medications   Medication Sig Start Date End Date Taking? Authorizing Provider  acetaminophen (TYLENOL) 500 MG tablet Take 1,000 mg by mouth every 8 (eight) hours  as needed for mild pain.    Yes Historical Provider, MD  aspirin EC 81 MG EC tablet Take 1 tablet (81 mg total) by mouth daily. 08/17/13  Yes Belkys A Regalado, MD  atorvastatin (LIPITOR) 80 MG tablet Take 1 tablet (80 mg total) by mouth daily at 6 PM. 10/29/13  Yes Amy D Clegg, NP  calcium acetate (PHOSLO) 667 MG capsule Take 2 capsules (1,334 mg total) by mouth 3 (three) times daily with meals. 11/10/13  Yes Hosie Poisson, MD  cyclobenzaprine (FLEXERIL) 10 MG tablet Take 10 mg by mouth 3 (three) times daily as needed for muscle spasms.   Yes Historical Provider, MD  Insulin Detemir (LEVEMIR FLEXPEN) 100 UNIT/ML Pen Inject 20 Units into the skin every evening. 10/29/13  Yes Amy D Clegg, NP  insulin lispro (HUMALOG KWIKPEN) 100 UNIT/ML KiwkPen Inject 0.05 mLs (5 Units total) into the skin 3 (three) times daily with meals. 10/29/13  Yes Amy D Clegg, NP  methylcellulose (ARTIFICIAL TEARS) 1 % ophthalmic solution Place 1 drop into both eyes at bedtime as needed (dry eyes).   Yes Historical Provider, MD  midodrine (PROAMATINE) 10 MG tablet Take 1 tablet (10 mg total) by mouth 3 (three) times daily with meals. 10/29/13  Yes Amy D Clegg, NP  multivitamin (RENA-VIT) TABS tablet Take 1 tablet by mouth daily.   Yes Historical Provider, MD  ondansetron (ZOFRAN) 4 MG tablet Take 1 tablet (4 mg total) by mouth  every 8 (eight) hours as needed for nausea or vomiting. 10/29/13  Yes Amy D Clegg, NP  pantoprazole (PROTONIX) 40 MG tablet Take 1 tablet (40 mg total) by mouth daily. 10/29/13  Yes Amy D Clegg, NP  polyethylene glycol (MIRALAX / GLYCOLAX) packet Take 17 g by mouth daily as needed (constipation). 10/29/13  Yes Amy D Clegg, NP  warfarin (COUMADIN) 1 MG tablet Take 1 mg by mouth See admin instructions. Take 1mg  on Friday and Saturday. Sunday take 0.5mg .   Yes Historical Provider, MD   BP 106/64  Pulse 103  Temp(Src) 97.8 F (36.6 C) (Oral)  Resp 29  Ht 5\' 7"  (1.702 m)  Wt 265 lb (120.203 kg)  BMI 41.50 kg/m2  SpO2 98%  LMP 09/18/2013 Physical Exam  Nursing note and vitals reviewed. Constitutional: She appears well-developed and well-nourished. No distress.  HENT:  Head: Normocephalic and atraumatic.  Eyes: Pupils are equal, round, and reactive to light.  Neck: Normal range of motion. Neck supple.  Cardiovascular: Normal rate and regular rhythm.   Pulmonary/Chest: Effort normal.  Abdominal: Soft. Bowel sounds are normal. There is tenderness (diffuse mild to moderate tenderness). There is no rebound, no guarding, no CVA tenderness and negative Murphy's sign.    Exam limited by body habitus  Musculoskeletal:  Right calf fasciotomy surgical wounds are without obvious signs of infection. No crepitus, induration, erythema or draining. Very little tenderness  Neurological: She is alert.  Skin: Skin is warm and dry.    ED Course  Procedures (including critical care time) Labs Review Labs Reviewed  COMPREHENSIVE METABOLIC PANEL - Abnormal; Notable for the following:    Glucose, Bld 152 (*)    BUN 40 (*)    Creatinine, Ser 5.30 (*)    Albumin 2.6 (*)    GFR calc non Af Amer 9 (*)    GFR calc Af Amer 10 (*)    All other components within normal limits  CBC WITH DIFFERENTIAL - Abnormal; Notable for the following:    RBC 3.31 (*)  Hemoglobin 9.1 (*)    HCT 30.3 (*)    RDW 20.2 (*)      All other components within normal limits  PROTIME-INR - Abnormal; Notable for the following:    Prothrombin Time 15.6 (*)    All other components within normal limits  URINALYSIS, ROUTINE W REFLEX MICROSCOPIC - Abnormal; Notable for the following:    APPearance TURBID (*)    Glucose, UA 250 (*)    Hgb urine dipstick LARGE (*)    Ketones, ur 15 (*)    Protein, ur >300 (*)    Leukocytes, UA LARGE (*)    All other components within normal limits  URINE MICROSCOPIC-ADD ON - Abnormal; Notable for the following:    Squamous Epithelial / LPF FEW (*)    Bacteria, UA MANY (*)    All other components within normal limits  LIPASE, BLOOD    Imaging Review Ct Abdomen Pelvis Wo Contrast  11/25/2013   CLINICAL DATA:  Diffuse abdominal pain. Nausea, vomiting since Friday. Dialysis patient. History of CHF, CAD, GERD, hypertension, diabetes. History of lung cancer, chronic kidney disease, gastritis. Previous tubal ligation and lobectomy. Recent cardiac catheterization.  EXAM: CT ABDOMEN AND PELVIS WITHOUT CONTRAST  TECHNIQUE: Multidetector CT imaging of the abdomen and pelvis was performed following the standard protocol without IV contrast.  COMPARISON:  Ultrasound the abdomen 12/05/2012 and 06/01/2011 and PET-CT on 07/28/2011  FINDINGS: Lower chest: There bilateral pleural effusions. Heart is mildly enlarged. There are coronary artery calcifications. Trace pericardial effusion is present.  Upper abdomen: No focal abnormality identified within the liver, is clean spleen, pancreas, or adrenal glands. A left renal cyst is 5.6 cm. There are calcified dependent gallstones within the gallbladder. No other evidence for acute cholecystitis.  Bowel: The stomach and small bowel loops have a normal appearance. The appendix is well seen and has a normal appearance. There are numerous colonic diverticula but no evidence for acute diverticulitis.  Pelvis: There are small locules of gas surrounding the urinary bladder. The  may be a small amount of gas within the dome of the bladder. Question of recent catheterization or other bladder procedure. No pelvic abscess. The uterus is present. No adnexal mass.Small amount of free fluid.  Abdominal wall: There is soft tissue defect in the right lower quadrant consistent with recent surgery. There is diffuse body wall edema. Small right inguinal lymph nodes are noted. These may be reactive.  Osseous structures: Unremarkable.  IMPRESSION: 1. Small amount of gas surrounding the urinary bladder, suspicious for infection or perforation of the urinary bladder. Question of any recent bladder procedures. 2. Soft tissue defect in the right lower quadrant of the abdomen consistent with recent surgery. 3. Body wall edema. 4. Cardiomegaly. 5. Coronary vascular calcifications. 6. Trace pericardial effusion. 7. Cholelithiasis. 8. Left renal cyst. The findings were discussed with Virgie Dad on 11/25/2013 at 12:16 pm.   Electronically Signed   By: Shon Hale M.D.   On: 11/25/2013 12:18     EKG Interpretation None      MDM   Final diagnoses:  Abdominal pain, unspecified abdominal location  Non-intractable vomiting with nausea, vomiting of unspecified type  Intra-abdominal free air of unknown etiology    This case is very complex and Dr. Wilson Singer has been involved and updated through out the patients stay in the ED. After the free air was seen he asked I order Zosyn to cover for possible intraabdominal infection. At this time the patients pain is under control. She  is tachycardic but otherwise hemodynamically stable.  12: 45 pm- I discussed case with General Surgery who has agreed to come see patient in the ED. Dr. Wilson Singer spoke with Dr. Alinda Money with Urology. He recommended foley catheter and surgery can speak with him if they need.  General Surgery has seen patient and recommends  HIDA scan, the would like antibiotics to be continued and patient admitted to medicine, they are unsure as to why  she has the free air to her bladder.  [facility], Team 10,. Inpatient,    Linus Mako, PA-C 11/25/13 1441

## 2013-11-26 ENCOUNTER — Inpatient Hospital Stay (HOSPITAL_COMMUNITY): Payer: Medicare Other

## 2013-11-26 ENCOUNTER — Ambulatory Visit (INDEPENDENT_AMBULATORY_CARE_PROVIDER_SITE_OTHER): Payer: Medicare Other | Admitting: Surgery

## 2013-11-26 VITALS — BP 136/81 | HR 98 | Temp 97.2°F | Resp 18 | Wt 265.0 lb

## 2013-11-26 DIAGNOSIS — N186 End stage renal disease: Secondary | ICD-10-CM

## 2013-11-26 DIAGNOSIS — I255 Ischemic cardiomyopathy: Secondary | ICD-10-CM | POA: Diagnosis present

## 2013-11-26 DIAGNOSIS — I82A19 Acute embolism and thrombosis of unspecified axillary vein: Secondary | ICD-10-CM

## 2013-11-26 DIAGNOSIS — K810 Acute cholecystitis: Secondary | ICD-10-CM | POA: Diagnosis present

## 2013-11-26 DIAGNOSIS — Z01810 Encounter for preprocedural cardiovascular examination: Secondary | ICD-10-CM

## 2013-11-26 DIAGNOSIS — I5022 Chronic systolic (congestive) heart failure: Secondary | ICD-10-CM

## 2013-11-26 LAB — GLUCOSE, CAPILLARY
GLUCOSE-CAPILLARY: 102 mg/dL — AB (ref 70–99)
GLUCOSE-CAPILLARY: 157 mg/dL — AB (ref 70–99)
GLUCOSE-CAPILLARY: 98 mg/dL (ref 70–99)
Glucose-Capillary: 102 mg/dL — ABNORMAL HIGH (ref 70–99)
Glucose-Capillary: 81 mg/dL (ref 70–99)
Glucose-Capillary: 148 mg/dL — ABNORMAL HIGH (ref 70–99)

## 2013-11-26 LAB — CBC
HEMATOCRIT: 36 % (ref 36.0–46.0)
Hemoglobin: 10.3 g/dL — ABNORMAL LOW (ref 12.0–15.0)
MCH: 26.3 pg (ref 26.0–34.0)
MCHC: 28.6 g/dL — ABNORMAL LOW (ref 30.0–36.0)
MCV: 92.1 fL (ref 78.0–100.0)
PLATELETS: 372 10*3/uL (ref 150–400)
RBC: 3.91 MIL/uL (ref 3.87–5.11)
RDW: 20.3 % — ABNORMAL HIGH (ref 11.5–15.5)
WBC: 5.1 10*3/uL (ref 4.0–10.5)

## 2013-11-26 LAB — BASIC METABOLIC PANEL
ANION GAP: 21 — AB (ref 5–15)
BUN: 23 mg/dL (ref 6–23)
CHLORIDE: 93 meq/L — AB (ref 96–112)
CO2: 24 mEq/L (ref 19–32)
Calcium: 8.3 mg/dL — ABNORMAL LOW (ref 8.4–10.5)
Creatinine, Ser: 3.51 mg/dL — ABNORMAL HIGH (ref 0.50–1.10)
GFR calc non Af Amer: 15 mL/min — ABNORMAL LOW (ref >90)
GFR, EST AFRICAN AMERICAN: 17 mL/min — AB (ref >90)
Glucose, Bld: 103 mg/dL — ABNORMAL HIGH (ref 70–99)
POTASSIUM: 4.1 meq/L (ref 3.7–5.3)
Sodium: 138 mEq/L (ref 137–147)

## 2013-11-26 MED ORDER — INSULIN ASPART 100 UNIT/ML ~~LOC~~ SOLN
0.0000 [IU] | SUBCUTANEOUS | Status: DC
  Administered 2013-11-27: 2 [IU] via SUBCUTANEOUS
  Administered 2013-11-27: 5 [IU] via SUBCUTANEOUS
  Administered 2013-11-27: 2 [IU] via SUBCUTANEOUS
  Administered 2013-11-28: 3 [IU] via SUBCUTANEOUS
  Administered 2013-11-28: 1 [IU] via SUBCUTANEOUS
  Administered 2013-11-28: 3 [IU] via SUBCUTANEOUS
  Administered 2013-11-29: 2 [IU] via SUBCUTANEOUS
  Administered 2013-11-29: 5 [IU] via SUBCUTANEOUS
  Administered 2013-11-29: 7 [IU] via SUBCUTANEOUS
  Administered 2013-11-29: 2 [IU] via SUBCUTANEOUS

## 2013-11-26 MED ORDER — HYDROMORPHONE HCL PF 1 MG/ML IJ SOLN
INTRAMUSCULAR | Status: AC
  Administered 2013-11-26: 1 mg
  Filled 2013-11-26: qty 1

## 2013-11-26 MED ORDER — HEPARIN SODIUM (PORCINE) 1000 UNIT/ML IJ SOLN
5000.0000 [IU] | Freq: Once | INTRAMUSCULAR | Status: AC
  Administered 2013-11-26: 5000 [IU] via INTRAVENOUS

## 2013-11-26 MED ORDER — MIDODRINE HCL 5 MG PO TABS
ORAL_TABLET | ORAL | Status: AC
  Filled 2013-11-26: qty 2

## 2013-11-26 NOTE — Consult Note (Signed)
WOC wound consult note Reason for Consult: evaluation of RLE wound and RLQ wound on the abdomen. She has had extensive vascular surgeries including fasciotomy in the past few months. She is followed by [facility] as an outpatient and has a HHRN for 3x wk wound assessments.  She is currently using NS moist to moist dressings and the wounds are clean. She was using Santyl in the right groin however at this time I do not feel that is needed and will not order it.  Wound type: surgical wounds  Pressure Ulcer POA: No Measurement: R groin: 10cm x 5cm x 1.0cm; clean with no necrotic tissue, 100% granulation tissue R lateral LE: 6cm x 1.0cm x 0.5cm; clean, 100% granulation tissue, moist and pink R medial LE: 8cm x 2.5cm x 2.0cm;clean, pink, moist 90% granulation tissue with 10% slough in the base   Wound bed: see above Drainage (amount, consistency, odor) minimal serous drainage on old dressing.  Periwound:intact Dressing procedure/placement/frequency: Continue NS moist gauze dressings to all the sites. We discussed the use of Santyl and she has no real need for the use of enzymatic debridement ointment in the groin. She may consider asking [facility] at follow up on the use of the Santyl on the RLE medial wound as it does have a small about of slough in the base. She will follow up with [facility] on this at her next appt.   Discussed POC with patient and bedside nurse.  Re consult if needed, will not follow at this time. Thanks  Yvana Samonte Kellogg, [place] [phone])

## 2013-11-26 NOTE — Consult Note (Addendum)
CARDIOLOGY CONSULT NOTE  Patient ID: Hannah [name] MRN: [id number] DOB/AGE: May 15, 1970 43 y.o.  Admit date: 11/25/2013 Primary Cardiologist: Dr. Haroldine Laws Reason for Consultation: Pre-operative evaluation  HPI: Unfortunate 43 y/o female with DM, ESRD, severe 3 V CAD, and cardiomyopathy with an EF of 25-30% by echo July 2015. We are asked to see for pre op cholecystectomy clearance.  The pt has been hospitalized pretty much continuously since May. In mid May 2015, she was in the midst of workup for AV fistula surgery. An outpatient stress test was ordered. This was done on May 18 and revealed anterior and apical ischemia with an ejection fraction of 22%. She was referred for admission due to the high-risk nature of her stress test and progressive HF symptoms with volume overload. On admission, her creatinine was 4.1 and the decision was made to proceed with cardiac catheterization despite the risk of contrast nephropathy given the high-risk nature of her stress test and the fact that she was felt to be nearing ESRD anyway. Cardiac cath was done on 5/18 and revealed 3v CAD with diabetic vasculopathy. She was also found to have bilateral carotid stenosis with a 60-79% lesion on the right and a high-grade 80-99% lesion on the left. She was seen by Dr. Prescott Gum for possible CABG not felt to be vacularizable. Several days post-cath she decompensated with respiratory failure in the setting of progressive volume overload and cardiogenic shock. On 5/28 she was taken to the cath lab for [place] and placement of [place] Ganz catheter which conformed cardiogenic shock and an IABP was palced. With the IABP in place she developed an ischemic right foot and the pump had to be removed urgently. [facility] was consulted. She was taken to the OR on 6/1 for femoral thromboembolectomy and 4-compartment fasciotomy of her RLE. She was started on midodrine, titrated to 10 tid and also florinef 0.1 to help maintain her BP. She was  unable to tolerate any b-blockers or ACEs due to hypotension. She was discharge 10/29/13 and readmitted 11/06/13-11/10/13 and had debridement of her RLE. She was again admitted 11/25/13 with abdominal pain and work reveals acute cholecystitis. Fr om a cardiac standpoint she has well without recent CHF or angina. She walks with a walker. She tolerates HD with Midodrine.      Review of systems complete and found to be negative unless listed above in HPI General: negative for chills, fever, night sweats or weight changes.  Cardiovascular: negative for chest pain, dyspnea on exertion, edema, orthopnea, palpitations, paroxysmal nocturnal dyspnea or shortness of breath Dermatological: negative for rash Respiratory: negative for cough or wheezing Urologic: negative for dysuria Abdominal: diarrhea, bright red blood per rectum, melena, or hematemesis Neurologic: negative for visual changes, syncope, or dizziness All other systems reviewed and are otherwise negative except as noted above.   Past Medical History  Diagnosis Date  . High cholesterol   . Diabetic retinopathy   . Peripheral neuropathy     "tips of toes"  . Blind right eye   . CHF (congestive heart failure)   . CAD (coronary artery disease)   . GERD (gastroesophageal reflux disease)   . Hypertension   . History of lung cancer 07/2011    s/p left lower lobectomy  . Diabetes mellitus     IDDM  . Cataract of right eye   . Ulcer of toe of right foot 07/10/2012    great toe  . Breast calcification, left 06/2012  . Acute biphenotypic leukemia   .  CKD (chronic kidney disease) stage 5, GFR less than 15 ml/min   . Nephrotic syndrome   . Gastritis     H/o gastritis on prior endoscopy  . Anemia   . Carotid artery disease      Family History  Problem Relation Age of Onset  . Coronary artery disease Father   . Asthma Father   . COPD Father   . Hypertension Father   . Hyperlipidemia Father   . Diabetes Father   . Congestive Heart  Failure Father   . Heart disease Father     before age 82  . Heart attack Father   . Peripheral vascular disease Father   . Hypertension Mother   . Hyperlipidemia Mother   . Diabetes Mother   . Cancer Mother   . Cancer Maternal Aunt      three aunts, bone, breast, ?  . Hypertension Brother   . Diabetes Brother   . Diabetes Sister   . Colon cancer Neg Hx   . Celiac disease Neg Hx   . Crohn's disease Neg Hx   . Ulcerative colitis Neg Hx   . Lung cancer Maternal Grandmother     History   Social History  . Marital Status: Married    Spouse Name: N/A    Number of Children: 3  . Years of Education: N/A   Occupational History  . Not on file.   Social History Main Topics  . Smoking status: Former Smoker -- 2.00 packs/day for 30 years    Quit date: 08/01/2011  . Smokeless tobacco: Never Used  . Alcohol Use: No  . Drug Use: No  . Sexual Activity: Yes   Other Topics Concern  . Not on file   Social History Narrative  . No narrative on file     Prescriptions prior to admission  Medication Sig Dispense Refill  . acetaminophen (TYLENOL) 500 MG tablet Take 1,000 mg by mouth every 8 (eight) hours as needed for mild pain.       Marland Kitchen aspirin EC 81 MG EC tablet Take 1 tablet (81 mg total) by mouth daily.  30 tablet  0  . atorvastatin (LIPITOR) 80 MG tablet Take 1 tablet (80 mg total) by mouth daily at 6 PM.  30 tablet  6  . calcium acetate (PHOSLO) 667 MG capsule Take 2 capsules (1,334 mg total) by mouth 3 (three) times daily with meals.  90 capsule  0  . cyclobenzaprine (FLEXERIL) 10 MG tablet Take 10 mg by mouth 3 (three) times daily as needed for muscle spasms.      . Insulin Detemir (LEVEMIR FLEXPEN) 100 UNIT/ML Pen Inject 20 Units into the skin every evening.  15 mL  11  . insulin lispro (HUMALOG KWIKPEN) 100 UNIT/ML KiwkPen Inject 0.05 mLs (5 Units total) into the skin 3 (three) times daily with meals.  15 mL  0  . methylcellulose (ARTIFICIAL TEARS) 1 % ophthalmic solution Place  1 drop into both eyes at bedtime as needed (dry eyes).      . midodrine (PROAMATINE) 10 MG tablet Take 1 tablet (10 mg total) by mouth 3 (three) times daily with meals.  90 tablet  6  . multivitamin (RENA-VIT) TABS tablet Take 1 tablet by mouth daily.      . ondansetron (ZOFRAN) 4 MG tablet Take 1 tablet (4 mg total) by mouth every 8 (eight) hours as needed for nausea or vomiting.  20 tablet  0  . pantoprazole (PROTONIX) 40 MG  tablet Take 1 tablet (40 mg total) by mouth daily.  30 tablet  6  . polyethylene glycol (MIRALAX / GLYCOLAX) packet Take 17 g by mouth daily as needed (constipation).  14 each  6  . warfarin (COUMADIN) 1 MG tablet Take 1 mg by mouth See admin instructions. Take 1mg  on Friday and Saturday. Sunday take 0.5mg .       Current Scheduled Meds: . aspirin EC  81 mg Oral Daily  . atorvastatin  80 mg Oral q1800  . [START ON 11/28/2013] darbepoetin (ARANESP) injection - DIALYSIS  150 mcg Intravenous Q Wed-HD  . ferric gluconate (FERRLECIT/NULECIT) IV  125 mg Intravenous Q M,W,F-HD  . heparin  5,000 Units Subcutaneous 3 times per day  . insulin aspart  0-9 Units Subcutaneous 6 times per day  . insulin detemir  10 Units Subcutaneous QHS  . lanthanum  1,000 mg Oral TID WC  . midodrine  10 mg Oral TID WC  . multivitamin  1 tablet Oral QHS  . pantoprazole  40 mg Oral Q1200  . piperacillin-tazobactam (ZOSYN)  IV  2.25 g Intravenous 3 times per day  . sodium chloride  3 mL Intravenous Q12H   Continuous Infusions:  PRN Meds:.sodium chloride, acetaminophen, acetaminophen, morphine injection, ondansetron (ZOFRAN) IV, ondansetron, polyvinyl alcohol, sodium chloride  Physical exam Blood pressure 146/93, pulse 94, temperature 97.6 F (36.4 C), temperature source Oral, resp. rate 18, height 5\' 7"  (1.702 m), weight 266 lb 4.8 oz (120.793 kg), last menstrual period 09/18/2013, SpO2 99.00%. General: NAD Neck: Thick, no JVD, no thyromegaly or thyroid nodule.  Lungs: Clear to auscultation  bilaterally with normal respiratory effort. CV: Nondisplaced PMI.  Heart regular S1/S2, no S3/S4, no murmur.  1+ edema 1/2 up lower legs bilaterally.  No carotid bruit.  Normal pedal pulses.  Abdomen: Soft, mild tenderness to deep palpation RUQ, no hepatosplenomegaly, no distention.  Skin: Intact without lesions or rashes.  Neurologic: Alert and oriented x 3.  Psych: Normal affect. Extremities: No clubbing or cyanosis.  HEENT: Normal.   Labs:   Lab Results  Component Value Date   WBC 6.2 11/25/2013   HGB 9.1* 11/25/2013   HCT 30.3* 11/25/2013   MCV 91.5 11/25/2013   PLT 341 11/25/2013    Recent Labs Lab 11/25/13 0903  NA 139  K 5.1  CL 96  CO2 28  BUN 40*  CREATININE 5.30*  CALCIUM 9.4  9.5  PROT 6.1  BILITOT 0.5  ALKPHOS 100  ALT 19  AST 24  GLUCOSE 152*   Lab Results  Component Value Date   CKTOTAL 166 06/10/2011   CKMB 6.1* 06/10/2011   TROPONINI <0.30 09/19/2013     EKG: NSR, anterior infarction, slight anterolateral and inferior ST depression and T wave inversion  Radiology:  - Abdominal US: Gallstones in gallbladder with possible early cholecystitis.   ASSESSMENT AND PLAN:   See attending thoughts below.  Signed: Kerin Ransom 11/26/2013  Patient seen with PA, agree with the above note.   43 yo with extensive/complex PMH outlined above presents with symptoms/imaging concerning for acute cholecystitis.  1. Pre-operative evaluation: Patient unfortunately is at high risk for cardiac complications.  She has an ischemic cardiomyopathy with EF 25-30% and recent cardiogenic shock requiring pressors/inotropes. She has severe 3VD deemed unrevascularizable by CVTS.  She had vfib arrest during her recent long hospitalization.  Finally, she had femoral artery thrombus complicated by right leg compartment syndrome during her recent hospitalization as well, requiring fasciotomy.  She has been  requiring midodrine to keep her BP up for HD.  She has moderate left internal carotid and  severe right internal carotid stenosis.  She has had a recent right arm DVT, on coumadin.  Overall, her surgical risk is quite high. I would favor cholecystostomy tube over general anesthesia/surgery.   2. UTI: On antibiotics.  3. Cellulitis: Right leg looks much better than when I saw her at her last office appointment.  4. CAD: No chest pain, continues on ASA and statin.  Turned down for CABG by CVTS during last admission.  5. Chronic systolic CHF: Volume managed by HD.  EF 25-30%.  Has not tolerated ACEI or beta blocker, on midodrine to keep BP up with HD.  Will repeat echo to see if any improvement in EF.  6. ESRD: HD 7. Right arm DVT (7/20): On coumadin. INR is subtherapeutic, if decide on cholecystostomy tube would hold coumadin for now.   Loralie Champagne 11/26/2013 6:05 PM

## 2013-11-26 NOTE — Progress Notes (Signed)
Patient ID: Hannah [name], female   DOB: 08-19-1970, 43 y.o.   MRN: [id number]    Subjective: The patient is complaining of right upper quadrant abdominal pain. She hasn't had as much nausea and vomiting since she hasn't eaten.  Objective: Vital signs in last 24 hours: Temp:  [97.2 F (36.2 C)-98 F (36.7 C)] 97.2 F (36.2 C) (08/10 0603) Pulse Rate:  [85-103] 97 (08/10 0603) Resp:  [10-29] 18 (08/10 0603) BP: (90-127)/(51-86) 120/80 mmHg (08/10 0603) SpO2:  [90 %-99 %] 99 % (08/10 0603) Weight:  [266 lb 1.5 oz (120.7 kg)-266 lb 4.8 oz (120.793 kg)] 266 lb 4.8 oz (120.793 kg) (08/09 2137) Last BM Date: 11/24/13  Intake/Output from previous day: 08/09 0701 - 08/10 0700 In: 100 [IV Piggyback:100] Out: 75 [Urine:75] Intake/Output this shift:    PE: Abd: Soft, mildly tender in the right upper quadrant, obese, active bowel sounds  Lab Results:   Recent Labs  11/25/13 0903  WBC 6.2  HGB 9.1*  HCT 30.3*  PLT 341   BMET  Recent Labs  11/25/13 0903  NA 139  K 5.1  CL 96  CO2 28  GLUCOSE 152*  BUN 40*  CREATININE 5.30*  CALCIUM 9.4  9.5   PT/INR  Recent Labs  11/25/13 1038  LABPROT 15.6*  INR 1.24   CMP     Component Value Date/Time   NA 139 11/25/2013 0903   K 5.1 11/25/2013 0903   CL 96 11/25/2013 0903   CO2 28 11/25/2013 0903   GLUCOSE 152* 11/25/2013 0903   BUN 40* 11/25/2013 0903   CREATININE 5.30* 11/25/2013 0903   CREATININE 4.17* 07/06/2013 1618   CALCIUM 9.4 11/25/2013 0903   CALCIUM 9.5 11/25/2013 0903   PROT 6.1 11/25/2013 0903   ALBUMIN 2.6* 11/25/2013 0903   AST 24 11/25/2013 0903   ALT 19 11/25/2013 0903   ALKPHOS 100 11/25/2013 0903   BILITOT 0.5 11/25/2013 0903   GFRNONAA 9* 11/25/2013 0903   GFRNONAA 71 07/13/2011 0835   GFRAA 10* 11/25/2013 0903   GFRAA 82 07/13/2011 0835   Lipase     Component Value Date/Time   LIPASE 32 11/25/2013 0903       Studies/Results: Ct Abdomen Pelvis Wo Contrast  11/25/2013   CLINICAL DATA:  Diffuse abdominal pain. Nausea,  vomiting since Friday. Dialysis patient. History of CHF, CAD, GERD, hypertension, diabetes. History of lung cancer, chronic kidney disease, gastritis. Previous tubal ligation and lobectomy. Recent cardiac catheterization.  EXAM: CT ABDOMEN AND PELVIS WITHOUT CONTRAST  TECHNIQUE: Multidetector CT imaging of the abdomen and pelvis was performed following the standard protocol without IV contrast.  COMPARISON:  Ultrasound the abdomen 12/05/2012 and 06/01/2011 and PET-CT on 07/28/2011  FINDINGS: Lower chest: There bilateral pleural effusions. Heart is mildly enlarged. There are coronary artery calcifications. Trace pericardial effusion is present.  Upper abdomen: No focal abnormality identified within the liver, is clean spleen, pancreas, or adrenal glands. A left renal cyst is 5.6 cm. There are calcified dependent gallstones within the gallbladder. No other evidence for acute cholecystitis.  Bowel: The stomach and small bowel loops have a normal appearance. The appendix is well seen and has a normal appearance. There are numerous colonic diverticula but no evidence for acute diverticulitis.  Pelvis: There are small locules of gas surrounding the urinary bladder. The may be a small amount of gas within the dome of the bladder. Question of recent catheterization or other bladder procedure. No pelvic abscess. The uterus is present. No  adnexal mass.Small amount of free fluid.  Abdominal wall: There is soft tissue defect in the right lower quadrant consistent with recent surgery. There is diffuse body wall edema. Small right inguinal lymph nodes are noted. These may be reactive.  Osseous structures: Unremarkable.  IMPRESSION: 1. Small amount of gas surrounding the urinary bladder, suspicious for infection or perforation of the urinary bladder. Question of any recent bladder procedures. 2. Soft tissue defect in the right lower quadrant of the abdomen consistent with recent surgery. 3. Body wall edema. 4. Cardiomegaly. 5.  Coronary vascular calcifications. 6. Trace pericardial effusion. 7. Cholelithiasis. 8. Left renal cyst. The findings were discussed with Virgie Dad on 11/25/2013 at 12:16 pm.   Electronically Signed   By: Shon Hale M.D.   On: 11/25/2013 12:18    Anti-infectives: Anti-infectives   Start     Dose/Rate Route Frequency Ordered Stop   11/26/13 0200  piperacillin-tazobactam (ZOSYN) IVPB 2.25 g  Status:  Discontinued     2.25 g 100 mL/hr over 30 Minutes Intravenous Every 8 hours 11/25/13 1556 11/25/13 1805   11/25/13 2200  piperacillin-tazobactam (ZOSYN) IVPB 2.25 g     2.25 g 100 mL/hr over 30 Minutes Intravenous 3 times per day 11/25/13 1805     11/25/13 1600  piperacillin-tazobactam (ZOSYN) IVPB 3.375 g  Status:  Discontinued     3.375 g 100 mL/hr over 30 Minutes Intravenous  Once 11/25/13 1555 11/25/13 1805   11/25/13 1245  piperacillin-tazobactam (ZOSYN) IVPB 3.375 g     3.375 g 12.5 mL/hr over 240 Minutes Intravenous  Once 11/25/13 1238 11/25/13 1402       Assessment/Plan  1. Abdominal pain, question biliary colic 2. Cholelithiasis  3. End-stage renal disease on hemodialysis  4. Diabetes mellitus 5. Hypertension 6. History of lung cancer 7. Coronary artery disease  Plan: 1. Await HIDA scan today. The patient does have symptoms consistent with biliary disease. I think it is doubtful the air bubble seen on her CT scan around her bladder are significant. We will follow along and await results for further recommendations.  LOS: 1 day    Hasan Douse E 11/26/2013, 9:37 AM Pager: [phone]

## 2013-11-26 NOTE — Progress Notes (Signed)
I think this is gb. Will get [name] and then follow up

## 2013-11-26 NOTE — Progress Notes (Signed)
[facility] Progress Note  Assessment/Plan: 1. Abdominal Pain/Nausea/Vomiting - Work-up on progress. CT abd/pelvis with free air around urinary bladder concerning for infection vs microperforation from diverticulosis. Afebrile. Normal LFTs. Surgery has seen with recs for abx and HIDA scan in the a.m. On Zosyn 2. ESRD - MWF, K 5.1 8/9. HD today 3. Hypertension/volume - SBPs 100s-110s on chronic midodrine TID. Tapering edw down outpatient.  4. Anemia - Hgb 9.1. On Aranesp 150 mcg/ last dose 8/5. Fe load in progress through 8/21 5. Metabolic bone disease - Ca 9.4 (10.5 corrected). Phos poorly controlled outpt on Phoslo 2 ac. Holding vit D for hypercalcemia. Low Ca bath. Try Fosrenol 6. Nutrition - NPO 7. RLE wounds/Cellulitis - S/p thromboembolectomy, fasciotomy and several debridements, [facility]VS has been following. Just finished course of clindamycin. Wound care per [facility] three times a week.  8. Carotid stenosis - [facility] following outpatient. (Rt 60-79%, Lt 80-99%).  9. Permanent HD access - AVF planned once carotids addressed per VVS 10. Rt Axillary DVT - On heparin here  11. CAD - 3V disease/ no targets for revasc per cath 08/2013 12. Chronic systolic-diastolic CHF/ ICCM - EF 67-67% per echo 10/2013 13. DM - insulin per primary       14. [facility]VS following multiple issues - We advised of her admission in the am. Had outpatient appt tomorrow for follow-up of 7, 8, and [place], [place] [phone] 11/26/2013,9:42 AM  LOS: 1 day   Pt seen, examined and agree w A/P as above.  [name] MD pager [phone]    cell [phone] 11/26/2013, 2:59 PM    Subjective:   Still with abdominal pain  Objective Filed Vitals:   11/25/13 1644 11/25/13 1707 11/25/13 2137 11/26/13 0603  BP: 98/66 90/68 127/83 120/80  Pulse: 100 85 99 97  Temp:  98 F (36.7 C) 97.6 F (36.4 C) 97.2 F (36.2 C)  TempSrc:  Oral Oral Oral  Resp: 18 18 18 18   Height: 5\' 7"   (1.702 m)  5\' 7"  (1.702 m)   Weight: 120.7 kg (266 lb 1.5 oz)  120.793 kg (266 lb 4.8 oz)   SpO2:  94% 94% 99%   Physical Exam General: sitting up in bed, though no visible distress Heart: RRR Lungs: no wheezes or rales Abdomen: soft Extremities: 2+ LE edema R>L dressing right leg, some erythema Dialysis Access: left I-J  Dialysis Orders: Center: [facility] on MWF.  EDW 118.5 HD Bath 2/2.25 Time 4 Heparin 5000 Access Lt I-J TDC (plan perm access when other issues resolved) Hectorol 4 mcg IV/HD Aranesp 150 dosed 8/5 Units IV/HD Venofer load through 8/21  Recent labs: Hgb 8.7 (slowly improving from 7.9 on 7/15) Tsat 19%, P 9.8, PTH 692  Additional Objective Labs: Basic Metabolic Panel:  Recent Labs Lab 11/25/13 0903  NA 139  K 5.1  CL 96  CO2 28  GLUCOSE 152*  BUN 40*  CREATININE 5.30*  CALCIUM 9.4  9.5  PHOS 6.0*   Liver Function Tests:  Recent Labs Lab 11/25/13 0903  AST 24  ALT 19  ALKPHOS 100  BILITOT 0.5  PROT 6.1  ALBUMIN 2.6*    Recent Labs Lab 11/25/13 0903  LIPASE 32   CBC:  Recent Labs Lab 11/25/13 0903  WBC 6.2  NEUTROABS 4.3  HGB 9.1*  HCT 30.3*  MCV 91.5  PLT 341  CBG:  Recent Labs Lab 11/25/13 1743 11/26/13 0010 11/26/13 0602  GLUCAP 100* 102*  98  Studies/Results: Ct Abdomen Pelvis Wo Contrast  11/25/2013   CLINICAL DATA:  Diffuse abdominal pain. Nausea, vomiting since Friday. Dialysis patient. History of CHF, CAD, GERD, hypertension, diabetes. History of lung cancer, chronic kidney disease, gastritis. Previous tubal ligation and lobectomy. Recent cardiac catheterization.  EXAM: CT ABDOMEN AND PELVIS WITHOUT CONTRAST  TECHNIQUE: Multidetector CT imaging of the abdomen and pelvis was performed following the standard protocol without IV contrast.  COMPARISON:  Ultrasound the abdomen 12/05/2012 and 06/01/2011 and PET-CT on 07/28/2011  FINDINGS: Lower chest: There bilateral pleural effusions. Heart is mildly enlarged. There are coronary artery  calcifications. Trace pericardial effusion is present.  Upper abdomen: No focal abnormality identified within the liver, is clean spleen, pancreas, or adrenal glands. A left renal cyst is 5.6 cm. There are calcified dependent gallstones within the gallbladder. No other evidence for acute cholecystitis.  Bowel: The stomach and small bowel loops have a normal appearance. The appendix is well seen and has a normal appearance. There are numerous colonic diverticula but no evidence for acute diverticulitis.  Pelvis: There are small locules of gas surrounding the urinary bladder. The may be a small amount of gas within the dome of the bladder. Question of recent catheterization or other bladder procedure. No pelvic abscess. The uterus is present. No adnexal mass.Small amount of free fluid.  Abdominal wall: There is soft tissue defect in the right lower quadrant consistent with recent surgery. There is diffuse body wall edema. Small right inguinal lymph nodes are noted. These may be reactive.  Osseous structures: Unremarkable.  IMPRESSION: 1. Small amount of gas surrounding the urinary bladder, suspicious for infection or perforation of the urinary bladder. Question of any recent bladder procedures. 2. Soft tissue defect in the right lower quadrant of the abdomen consistent with recent surgery. 3. Body wall edema. 4. Cardiomegaly. 5. Coronary vascular calcifications. 6. Trace pericardial effusion. 7. Cholelithiasis. 8. Left renal cyst. The findings were discussed with Virgie Dad on 11/25/2013 at 12:16 pm.   Electronically Signed   By: Shon Hale M.D.   On: 11/25/2013 12:18   Medications:   . aspirin EC  81 mg Oral Daily  . atorvastatin  80 mg Oral q1800  . [START ON 11/28/2013] darbepoetin (ARANESP) injection - DIALYSIS  150 mcg Intravenous Q Wed-HD  . ferric gluconate (FERRLECIT/NULECIT) IV  125 mg Intravenous Q M,W,F-HD  . heparin  5,000 Units Subcutaneous 3 times per day  . insulin aspart  0-9 Units  Subcutaneous TID WC  . insulin detemir  10 Units Subcutaneous QHS  . lanthanum  1,000 mg Oral TID WC  . midodrine  10 mg Oral TID WC  . multivitamin  1 tablet Oral QHS  . pantoprazole  40 mg Oral Q1200  . piperacillin-tazobactam (ZOSYN)  IV  2.25 g Intravenous 3 times per day  . sodium chloride  3 mL Intravenous Q12H

## 2013-11-26 NOTE — Progress Notes (Signed)
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 She also had recent admission 7/21-7/25 for RLE cellulitis. She presented on 11/25/13 with complaints of abdominal pain, nausea and vomiting that started after she had consumed some seafood. Work up shows a normal white count, normal LFTs. VSS. A non contrast CT of abdomen and pelvis showed cholelithiasis, diverticulosis (without diverticulitis), normal appearing appendix and air surrounding the bladder.    Assessment/Plan:  1. Abdominal pain: Suspicious for biliary colic. Surgery consultation and followup appreciated. Discussed with Dr. Magda Kiel ultrasound and if positive for gallstones/gallbladder wall thickening-plan for laparoscopic cholecystectomy. She will be high risk for surgery given multiple comorbidities. Continue IV Zosyn. Have requested cardiology for preop clearance given complicated cardiac history. 2. UTI: Continue IV Zosyn pending urine culture results. 3. ESRD: Nephrology followup appreciated. Hemodialysis today. 4. Hypertension/hypotension: Fluctuating and mildly uncontrolled. On chronic midodrine. 5. Anemia: Follow CBCs. 6. History of adenocarcinoma of lung: Status post LLL lobectomy in 2013 7. Type II DM with retinopathy: Continue Levemir at current lower dose. Good inpatient control. 8. History of hyperlipidemia: Continue statins 9. History of GERD: Continue PPI 10. Chronic combined CHF/CAD: Appears volume overloaded-volume  management across hemodialysis. Cardiology consulted. Three-vessel disease/no targets for revascularization per Cath 5/15. LVEF 25-30% per echo 10/2013 11. Secondary hyperparathyroidism: Management per nephrology 12. History of recent right axillary DVT: Coumadin held and on IV heparin in case surgery planned. 13. History of right lower extremity wound/cellulitis: Status post thromboembolectomy, faciotomy and several debridements. Previous has been following. Just finished course of clindamycin. Wound care.   Code Status: Full code Family Communication: None at bedside. Disposition Plan: Home when medically stable.   Consultants:  General surgery  Nephrology  Cardiology  Procedures:  Hemodialysis  Antibiotics:  IV Zosyn   Subjective: Intermittent RUQ abdominal pain. No nausea or vomiting. Normal BM. Denies dyspnea or chest pain. Makes small amount of urine and does complain of dysuria.  Objective: Filed Vitals:   11/26/13 1500 11/26/13 1530 11/26/13 1556 11/26/13 1630  BP: 138/80 146/86 125/99 145/105  Pulse: 98 92 109 96  Temp:      TempSrc:      Resp:      Height:      Weight:      SpO2:       temperature 97.86F, respiratory rate 18 per minute and oxygen saturation 99%.  Intake/Output Summary (Last 24 hours) at 11/26/13 1647 Last data filed at 11/26/13 0350  Gross per 24 hour  Intake    103 ml  Output     75 ml  Net     28 ml   Filed Weights   11/25/13 0837 11/25/13 1644 11/25/13 2137  Weight: 120.203 kg (265 lb) 120.7 kg (266 lb 1.5 oz) 120.793 kg (266 lb 4.8 oz)     Exam:  General exam: Pleasant young female sitting propped up in bed this morning. Respiratory system: Diminished breath sounds in the bases but otherwise clear to auscultation. No increased work of breathing. Cardiovascular system: S1 & S2 heard, RRR. No JVD, murmurs, gallops, clicks. 1+  pitting bilateral leg edema. Gastrointestinal system: Abdomen is nondistended, soft and nontender.  Normal bowel sounds heard. Central nervous system: Alert and oriented. No focal neurological deficits. Extremities: Symmetric 5 x 5 power. Dressings over her right leg clean, dry and intact.   Data Reviewed: Basic Metabolic Panel:  Recent Labs Lab 11/25/13 0903  NA 139  K 5.1  CL 96  CO2 28  GLUCOSE 152*  BUN 40*  CREATININE 5.30*  CALCIUM 9.4  9.5  MG 2.3  PHOS 6.0*   Liver Function Tests:  Recent Labs Lab 11/25/13 0903  AST 24  ALT 19  ALKPHOS 100  BILITOT 0.5  PROT 6.1  ALBUMIN 2.6*    Recent Labs Lab 11/25/13 0903  LIPASE 32   No results found for this basename: AMMONIA,  in the last 168 hours CBC:  Recent Labs Lab 11/25/13 0903  WBC 6.2  NEUTROABS 4.3  HGB 9.1*  HCT 30.3*  MCV 91.5  PLT 341   Cardiac Enzymes: No results found for this basename: CKTOTAL, CKMB, CKMBINDEX, TROPONINI,  in the last 168 hours BNP (last 3 results)  Recent Labs  08/13/13 1949 09/06/13 0740  PROBNP 34187.0* 28622.0*   CBG:  Recent Labs Lab 11/25/13 1743 11/26/13 0010 11/26/13 0602 11/26/13 1251  GLUCAP 100* 102* 98 102*    No results found for this or any previous visit (from the past 240 hour(s)).     Studies: Ct Abdomen Pelvis Wo Contrast  11/25/2013   CLINICAL DATA:  Diffuse abdominal pain. Nausea, vomiting since Friday. Dialysis patient. History of CHF, CAD, GERD, hypertension, diabetes. History of lung cancer, chronic kidney disease, gastritis. Previous tubal ligation and lobectomy. Recent cardiac catheterization.  EXAM: CT ABDOMEN AND PELVIS WITHOUT CONTRAST  TECHNIQUE: Multidetector CT imaging of the abdomen and pelvis was performed following the standard protocol without IV contrast.  COMPARISON:  Ultrasound the abdomen 12/05/2012 and 06/01/2011 and PET-CT on 07/28/2011  FINDINGS: Lower chest: There bilateral pleural effusions. Heart is mildly enlarged. There are coronary artery calcifications. Trace pericardial effusion is present.  Upper abdomen:  No focal abnormality identified within the liver, is clean spleen, pancreas, or adrenal glands. A left renal cyst is 5.6 cm. There are calcified dependent gallstones within the gallbladder. No other evidence for acute cholecystitis.  Bowel: The stomach and small bowel loops have a normal appearance. The appendix is well seen and has a normal appearance. There are numerous colonic diverticula but no evidence for acute diverticulitis.  Pelvis: There are small locules of gas surrounding the urinary bladder. The may be a small amount of gas within the dome of the bladder. Question of recent catheterization or other bladder procedure. No pelvic abscess. The uterus is present. No adnexal mass.Small amount of free fluid.  Abdominal wall: There is soft tissue defect in the right lower quadrant consistent with recent surgery. There is diffuse body wall edema. Small right inguinal lymph nodes are noted. These may be reactive.  Osseous structures: Unremarkable.  IMPRESSION: 1. Small amount of gas surrounding the urinary bladder, suspicious for infection or perforation of the urinary bladder. Question of any recent bladder procedures. 2. Soft tissue defect in the right lower quadrant of the abdomen consistent with recent surgery. 3. Body wall edema. 4. Cardiomegaly. 5. Coronary vascular calcifications. 6. Trace pericardial effusion. 7. Cholelithiasis. 8. Left renal cyst. The findings were discussed with Virgie Dad on 11/25/2013 at 12:16 pm.   Electronically Signed   By: Shon Hale M.D.   On: 11/25/2013 12:18  US Abdomen Complete  11/26/2013   CLINICAL DATA:  Evaluate for cholecystitis, right upper quadrant pain  EXAM: ULTRASOUND ABDOMEN COMPLETE  COMPARISON:  None.  FINDINGS: Gallbladder:  There are cholelithiasis. The gallbladder is distended. There is mild gallbladder wall thickening measuring 3.4 mm. No sonographic Murphy sign noted.  Common bile duct:  Diameter: 2.7 mm  Liver:  No focal lesion identified. The liver is  enlarged. The echotexture is heterogeneous.  IVC:  No abnormality visualized.  Pancreas:  Visualized portion unremarkable.  Spleen:  Size and appearance within normal limits.  Right Kidney:  Length: 10.6 cm. Increased renal cortical echogenicity. No mass or hydronephrosis visualized.  Left Kidney:  Length: 11.9 cm. Increased renal cortical echogenicity. There is a 6.4 x 5.3 x 4.8 cm lobulated anechoic left renal mass with increased through transmission most consistent with a cyst. No hydronephrosis visualized.  Abdominal aorta:  No aneurysm visualized.  Other findings:  Bilateral pleural effusions.  IMPRESSION: 1. Cholelithiasis with mild gallbladder distension and mild gallbladder wall thickening concerning for cholecystitis. False-positive gallbladder wall thickening can be seen with hepatocellular disease and fluid overload states. 2. Hepatomegaly. 3. Bilateral pleural effusions.  4. Increased renal cortical echogenicity as can be seen with medical renal disease or AKI.   Electronically Signed   By: Kathreen Devoid   On: 11/26/2013 13:52        Scheduled Meds: . aspirin EC  81 mg Oral Daily  . atorvastatin  80 mg Oral q1800  . [START ON 11/28/2013] darbepoetin (ARANESP) injection - DIALYSIS  150 mcg Intravenous Q Wed-HD  . ferric gluconate (FERRLECIT/NULECIT) IV  125 mg Intravenous Q M,W,F-HD  . heparin  5,000 Units Subcutaneous 3 times per day  . insulin aspart  0-9 Units Subcutaneous TID WC  . insulin detemir  10 Units Subcutaneous QHS  . lanthanum  1,000 mg Oral TID WC  . midodrine  10 mg Oral TID WC  . multivitamin  1 tablet Oral QHS  . pantoprazole  40 mg Oral Q1200  . piperacillin-tazobactam (ZOSYN)  IV  2.25 g Intravenous 3 times per day  . sodium chloride  3 mL Intravenous Q12H   Continuous Infusions:   Principal Problem:   Abdominal pain of unknown etiology Active Problems:   Adenocarcinoma of lung   Diabetic retinopathy associated with type 2 diabetes mellitus   Hyperlipidemia    GERD (gastroesophageal reflux disease)   ESRD (end stage renal disease) on dialysis   Chronic combined systolic and diastolic CHF, NYHA class 3   Nausea & vomiting   Anemia of chronic disease    Time spent: 45 minutes.    Vernell Leep, MD, FACP, FHM. [facility] Pager [phone]  If 7PM-7AM, please contact night-coverage [email or web address] Password [email or web address] 11/26/2013, 4:47 PM    LOS: 1 day

## 2013-11-27 ENCOUNTER — Inpatient Hospital Stay (HOSPITAL_COMMUNITY): Payer: Medicare Other

## 2013-11-27 DIAGNOSIS — I509 Heart failure, unspecified: Secondary | ICD-10-CM

## 2013-11-27 DIAGNOSIS — N390 Urinary tract infection, site not specified: Secondary | ICD-10-CM

## 2013-11-27 LAB — GLUCOSE, CAPILLARY
GLUCOSE-CAPILLARY: 122 mg/dL — AB (ref 70–99)
GLUCOSE-CAPILLARY: 176 mg/dL — AB (ref 70–99)
Glucose-Capillary: 193 mg/dL — ABNORMAL HIGH (ref 70–99)
Glucose-Capillary: 253 mg/dL — ABNORMAL HIGH (ref 70–99)
Glucose-Capillary: 89 mg/dL (ref 70–99)
Glucose-Capillary: 95 mg/dL (ref 70–99)

## 2013-11-27 MED ORDER — HEPARIN (PORCINE) IN NACL 100-0.45 UNIT/ML-% IJ SOLN
1650.0000 [IU]/h | INTRAMUSCULAR | Status: DC
  Administered 2013-11-27 (×2): 1300 [IU]/h via INTRAVENOUS
  Administered 2013-11-28 (×2): 1650 [IU]/h via INTRAVENOUS
  Filled 2013-11-27 (×3): qty 250

## 2013-11-27 MED ORDER — BOOST / RESOURCE BREEZE PO LIQD
1.0000 | Freq: Three times a day (TID) | ORAL | Status: DC
  Administered 2013-11-29: 1 via ORAL

## 2013-11-27 MED ORDER — TECHNETIUM TC 99M MEBROFENIN IV KIT
5.0000 | PACK | Freq: Once | INTRAVENOUS | Status: AC | PRN
  Administered 2013-11-27: 5 via INTRAVENOUS

## 2013-11-27 MED ORDER — HEPARIN BOLUS VIA INFUSION
4000.0000 [IU] | Freq: Once | INTRAVENOUS | Status: AC
  Administered 2013-11-27: 4000 [IU] via INTRAVENOUS
  Filled 2013-11-27: qty 4000

## 2013-11-27 NOTE — Progress Notes (Signed)
Patient ID: Hannah [name], female   DOB: May 05, 1970, 43 y.o.   MRN: [id number]    Subjective: Patient still having right upper quadrant abdominal pain that radiates to her back. Still with intermittent nausea.  Objective: Vital signs in last 24 hours: Temp:  [97.2 F (36.2 C)-98.6 F (37 C)] 98.6 F (37 C) (08/11 0414) Pulse Rate:  [76-109] 98 (08/11 0414) Resp:  [16-18] 18 (08/11 0414) BP: (111-164)/(70-105) 131/78 mmHg (08/11 0414) SpO2:  [92 %-100 %] 92 % (08/11 0414) Weight:  [257 lb 8 oz (116.8 kg)-264 lb 15.9 oz (120.2 kg)] 259 lb 12.8 oz (117.845 kg) (08/10 2009) Last BM Date: 11/24/13  Intake/Output from previous day: 08/10 0701 - 08/11 0700 In: 27 [P.O.:24; I.V.:3] Out: 2900  Intake/Output this shift:    PE: Abd: Soft, tender in the right upper quadrant, active bowel sounds, nondistended, obese  Lab Results:   Recent Labs  11/25/13 0903 11/26/13 1953  WBC 6.2 5.1  HGB 9.1* 10.3*  HCT 30.3* 36.0  PLT 341 372   BMET  Recent Labs  11/25/13 0903 11/26/13 1953  NA 139 138  K 5.1 4.1  CL 96 93*  CO2 28 24  GLUCOSE 152* 103*  BUN 40* 23  CREATININE 5.30* 3.51*  CALCIUM 9.4  9.5 8.3*   PT/INR  Recent Labs  11/25/13 1038  LABPROT 15.6*  INR 1.24   CMP     Component Value Date/Time   NA 138 11/26/2013 1953   K 4.1 11/26/2013 1953   CL 93* 11/26/2013 1953   CO2 24 11/26/2013 1953   GLUCOSE 103* 11/26/2013 1953   BUN 23 11/26/2013 1953   CREATININE 3.51* 11/26/2013 1953   CREATININE 4.17* 07/06/2013 1618   CALCIUM 8.3* 11/26/2013 1953   PROT 6.1 11/25/2013 0903   ALBUMIN 2.6* 11/25/2013 0903   AST 24 11/25/2013 0903   ALT 19 11/25/2013 0903   ALKPHOS 100 11/25/2013 0903   BILITOT 0.5 11/25/2013 0903   GFRNONAA 15* 11/26/2013 1953   GFRNONAA 71 07/13/2011 0835   GFRAA 17* 11/26/2013 1953   GFRAA 82 07/13/2011 0835   Lipase     Component Value Date/Time   LIPASE 32 11/25/2013 0903       Studies/Results: Ct Abdomen Pelvis Wo Contrast  11/25/2013    CLINICAL DATA:  Diffuse abdominal pain. Nausea, vomiting since Friday. Dialysis patient. History of CHF, CAD, GERD, hypertension, diabetes. History of lung cancer, chronic kidney disease, gastritis. Previous tubal ligation and lobectomy. Recent cardiac catheterization.  EXAM: CT ABDOMEN AND PELVIS WITHOUT CONTRAST  TECHNIQUE: Multidetector CT imaging of the abdomen and pelvis was performed following the standard protocol without IV contrast.  COMPARISON:  Ultrasound the abdomen 12/05/2012 and 06/01/2011 and PET-CT on 07/28/2011  FINDINGS: Lower chest: There bilateral pleural effusions. Heart is mildly enlarged. There are coronary artery calcifications. Trace pericardial effusion is present.  Upper abdomen: No focal abnormality identified within the liver, is clean spleen, pancreas, or adrenal glands. A left renal cyst is 5.6 cm. There are calcified dependent gallstones within the gallbladder. No other evidence for acute cholecystitis.  Bowel: The stomach and small bowel loops have a normal appearance. The appendix is well seen and has a normal appearance. There are numerous colonic diverticula but no evidence for acute diverticulitis.  Pelvis: There are small locules of gas surrounding the urinary bladder. The may be a small amount of gas within the dome of the bladder. Question of recent catheterization or other bladder procedure. No pelvic abscess.  The uterus is present. No adnexal mass.Small amount of free fluid.  Abdominal wall: There is soft tissue defect in the right lower quadrant consistent with recent surgery. There is diffuse body wall edema. Small right inguinal lymph nodes are noted. These may be reactive.  Osseous structures: Unremarkable.  IMPRESSION: 1. Small amount of gas surrounding the urinary bladder, suspicious for infection or perforation of the urinary bladder. Question of any recent bladder procedures. 2. Soft tissue defect in the right lower quadrant of the abdomen consistent with recent  surgery. 3. Body wall edema. 4. Cardiomegaly. 5. Coronary vascular calcifications. 6. Trace pericardial effusion. 7. Cholelithiasis. 8. Left renal cyst. The findings were discussed with Virgie Dad on 11/25/2013 at 12:16 pm.   Electronically Signed   By: Shon Hale M.D.   On: 11/25/2013 12:18   US Abdomen Complete  11/26/2013   CLINICAL DATA:  Evaluate for cholecystitis, right upper quadrant pain  EXAM: ULTRASOUND ABDOMEN COMPLETE  COMPARISON:  None.  FINDINGS: Gallbladder:  There are cholelithiasis. The gallbladder is distended. There is mild gallbladder wall thickening measuring 3.4 mm. No sonographic Murphy sign noted.  Common bile duct:  Diameter: 2.7 mm  Liver:  No focal lesion identified. The liver is enlarged. The echotexture is heterogeneous.  IVC:  No abnormality visualized.  Pancreas:  Visualized portion unremarkable.  Spleen:  Size and appearance within normal limits.  Right Kidney:  Length: 10.6 cm. Increased renal cortical echogenicity. No mass or hydronephrosis visualized.  Left Kidney:  Length: 11.9 cm. Increased renal cortical echogenicity. There is a 6.4 x 5.3 x 4.8 cm lobulated anechoic left renal mass with increased through transmission most consistent with a cyst. No hydronephrosis visualized.  Abdominal aorta:  No aneurysm visualized.  Other findings:  Bilateral pleural effusions.  IMPRESSION: 1. Cholelithiasis with mild gallbladder distension and mild gallbladder wall thickening concerning for cholecystitis. False-positive gallbladder wall thickening can be seen with hepatocellular disease and fluid overload states. 2. Hepatomegaly. 3. Bilateral pleural effusions.  4. Increased renal cortical echogenicity as can be seen with medical renal disease or AKI.   Electronically Signed   By: Kathreen Devoid   On: 11/26/2013 13:52    Anti-infectives: Anti-infectives   Start     Dose/Rate Route Frequency Ordered Stop   11/26/13 0200  piperacillin-tazobactam (ZOSYN) IVPB 2.25 g  Status:   Discontinued     2.25 g 100 mL/hr over 30 Minutes Intravenous Every 8 hours 11/25/13 1556 11/25/13 1805   11/25/13 2200  piperacillin-tazobactam (ZOSYN) IVPB 2.25 g     2.25 g 100 mL/hr over 30 Minutes Intravenous 3 times per day 11/25/13 1805     11/25/13 1600  piperacillin-tazobactam (ZOSYN) IVPB 3.375 g  Status:  Discontinued     3.375 g 100 mL/hr over 30 Minutes Intravenous  Once 11/25/13 1555 11/25/13 1805   11/25/13 1245  piperacillin-tazobactam (ZOSYN) IVPB 3.375 g     3.375 g 12.5 mL/hr over 240 Minutes Intravenous  Once 11/25/13 1238 11/25/13 1402       Assessment/Plan  1. Abdominal pain, question biliary colic  2. Cholelithiasis  3. End-stage renal disease on hemodialysis  4. Diabetes mellitus  5. Hypertension  6. History of lung cancer  7. Coronary artery disease  Plan: 1. Patient was evaluated by cardiology. She is not felt to be a surgical candidate at this time. Her ultrasound was concerning for cholecystitis. We will obtain a HIDA scan today to actually determine whether she has acute cholecystitis or whether she just has  bad biliary colic. If her HIDA scan is positive, we will likely proceed with placing a percutaneous cholecystostomy drain. I have discussed this with the patient. Our normal plan would then be to do an interval cholecystectomy in 6-8 weeks; however, due to her cardiac risks, this would be unlikely for her. We will need to make further recommendations after her HIDA scan is complete. N.p.o. and no narcotic pain medicine until the HIDA scan is complete. I discussed all this with the patient and she understands.  LOS: 2 days    Jovani Colquhoun E 11/27/2013, 8:27 AM Pager: [phone]

## 2013-11-27 NOTE — Progress Notes (Signed)
  Echocardiogram 2D Echocardiogram has been performed.  Darlina Sicilian M 11/27/2013, 2:35 PM

## 2013-11-27 NOTE — Progress Notes (Signed)
[facility] Progress Note  Assessment/Plan: 1. Abdominal Pain/Nausea/Vomiting - Work-up on progress. CT abd/pelvis with free air around urinary bladder concerning for infection vs microperforation from diverticulosis. Afebrile. Normal LFTs. Surgery has seen with recs for abx and s/p HIDA scan this  a.m. On Zosyn 2. ESRD - MWF, K 5.1 8/9. Next HD Wed 3. Hypotension/volume - SBPs 100s-110s on chronic midodrine TID. Tapering edw down outpatient. Net UF Monday 2.9  4. Anemia - Hgb 9.1. On Aranesp 150 mcg/ last dose 8/5. Fe load in progress through 8/21 5. Metabolic bone disease - Ca 9.4 (10.5 corrected). Phos poorly controlled outpt on Phoslo 2 ac. Holding vit D for hypercalcemia. Low Ca bath. Try Fosrenol 6. Nutrition - CL - add resource needs protein for wound healing 7. RLE wounds/Cellulitis - S/p thromboembolectomy, fasciotomy and several debridements for ischemic leg, [facility] has been following. Just finished course of clindamycin. Wound care per [facility] three times a week wet to dry dressings bid 8. Carotid stenosis - [facility] following outpatient. (Rt 60-79%, Lt 80-99%).  9. Permanent HD access - AVF planned once carotids addressed per [facility] 10. Rt Axillary DVT - On heparin here  11. CAD - 3V disease/ no targets for revasc per cath 08/2013 12. Chronic systolic-diastolic CHF/ ICCM - EF 81-27% per echo 10/2013 13. DM - insulin per primary        14. [facility] following multiple issues - seen this am  [name], PA-C [place] [phone] 11/27/2013,11:01 AM  LOS: 2 days   Pt seen, examined and agree w A/P as above.  [name] MD pager [phone]    cell [phone] 11/27/2013, 12:54 PM    Subjective:   No problems with HD yesterday. Still with nausea and right UQ pain  Objective Filed Vitals:   11/26/13 1833 11/26/13 2009 11/27/13 0414 11/27/13 1000  BP: 139/84 111/77 131/78 97/71  Pulse: 76 107 98 94  Temp: 97.6 F (36.4 C) 98 F (36.7 C) 98.6 F (37 C)  98.6 F (37 C)  TempSrc: Oral Oral Oral Oral  Resp: 16 16 18 18   Height:  5\' 7"  (1.702 m)    Weight:  117.845 kg (259 lb 12.8 oz)    SpO2: 100% 95% 92% 96%   Physical Exam General: NAD in bed Heart: RRR Lungs: grossly clear without rales Abdomen: obese soft RUQ tenderness Extremities: ++ R>L edema with some erythema around the periphery of wound dressings. Dialysis Access: left I-J  Dialysis Orders: Center: [facility] on MWF.  EDW 118.5 HD Bath 2/2.25 Time 4 Heparin 5000 Access Lt I-J TDC (plan perm access when other issues resolved)  Hectorol 4 mcg IV/HD Aranesp 150 dosed 8/5 Units IV/HD Venofer load through 8/21  Recent labs: Hgb 8.7 (slowly improving from 7.9 on 7/15) Tsat 19%, P 9.8, PTH 692  Additional Objective Labs: Basic Metabolic Panel:  Recent Labs Lab 11/25/13 0903 11/26/13 1953  NA 139 138  K 5.1 4.1  CL 96 93*  CO2 28 24  GLUCOSE 152* 103*  BUN 40* 23  CREATININE 5.30* 3.51*  CALCIUM 9.4  9.5 8.3*  PHOS 6.0*  --    Liver Function Tests:  Recent Labs Lab 11/25/13 0903  AST 24  ALT 19  ALKPHOS 100  BILITOT 0.5  PROT 6.1  ALBUMIN 2.6*    Recent Labs Lab 11/25/13 0903  LIPASE 32   CBC:  Recent Labs Lab 11/25/13 0903 11/26/13 1953  WBC 6.2 5.1  NEUTROABS 4.3  --  HGB 9.1* 10.3*  HCT 30.3* 36.0  MCV 91.5 92.1  PLT 341 372   Blood Culture    Component Value Date/Time   SDES BLOOD RIGHT FOREARM 11/06/2013 1130   SPECREQUEST BOTTLES DRAWN AEROBIC AND ANAEROBIC 4CC 11/06/2013 1130   CULT  Value: NO GROWTH 5 DAYS Performed at [facility] 11/06/2013 1130   REPTSTATUS 11/12/2013 FINAL 11/06/2013 1130  CBG:  Recent Labs Lab 11/26/13 1839 11/26/13 2008 11/26/13 2355 11/27/13 0412 11/27/13 0731  GLUCAP 81 148* 157* 122* 89   Studies/Results: Ct Abdomen Pelvis Wo Contrast  11/25/2013   CLINICAL DATA:  Diffuse abdominal pain. Nausea, vomiting since Friday. Dialysis patient. History of CHF, CAD, GERD, hypertension, diabetes. History  of lung cancer, chronic kidney disease, gastritis. Previous tubal ligation and lobectomy. Recent cardiac catheterization.  EXAM: CT ABDOMEN AND PELVIS WITHOUT CONTRAST  TECHNIQUE: Multidetector CT imaging of the abdomen and pelvis was performed following the standard protocol without IV contrast.  COMPARISON:  Ultrasound the abdomen 12/05/2012 and 06/01/2011 and PET-CT on 07/28/2011  FINDINGS: Lower chest: There bilateral pleural effusions. Heart is mildly enlarged. There are coronary artery calcifications. Trace pericardial effusion is present.  Upper abdomen: No focal abnormality identified within the liver, is clean spleen, pancreas, or adrenal glands. A left renal cyst is 5.6 cm. There are calcified dependent gallstones within the gallbladder. No other evidence for acute cholecystitis.  Bowel: The stomach and small bowel loops have a normal appearance. The appendix is well seen and has a normal appearance. There are numerous colonic diverticula but no evidence for acute diverticulitis.  Pelvis: There are small locules of gas surrounding the urinary bladder. The may be a small amount of gas within the dome of the bladder. Question of recent catheterization or other bladder procedure. No pelvic abscess. The uterus is present. No adnexal mass.Small amount of free fluid.  Abdominal wall: There is soft tissue defect in the right lower quadrant consistent with recent surgery. There is diffuse body wall edema. Small right inguinal lymph nodes are noted. These may be reactive.  Osseous structures: Unremarkable.  IMPRESSION: 1. Small amount of gas surrounding the urinary bladder, suspicious for infection or perforation of the urinary bladder. Question of any recent bladder procedures. 2. Soft tissue defect in the right lower quadrant of the abdomen consistent with recent surgery. 3. Body wall edema. 4. Cardiomegaly. 5. Coronary vascular calcifications. 6. Trace pericardial effusion. 7. Cholelithiasis. 8. Left renal cyst.  The findings were discussed with Virgie Dad on 11/25/2013 at 12:16 pm.   Electronically Signed   By: Shon Hale M.D.   On: 11/25/2013 12:18   US Abdomen Complete  11/26/2013   CLINICAL DATA:  Evaluate for cholecystitis, right upper quadrant pain  EXAM: ULTRASOUND ABDOMEN COMPLETE  COMPARISON:  None.  FINDINGS: Gallbladder:  There are cholelithiasis. The gallbladder is distended. There is mild gallbladder wall thickening measuring 3.4 mm. No sonographic Murphy sign noted.  Common bile duct:  Diameter: 2.7 mm  Liver:  No focal lesion identified. The liver is enlarged. The echotexture is heterogeneous.  IVC:  No abnormality visualized.  Pancreas:  Visualized portion unremarkable.  Spleen:  Size and appearance within normal limits.  Right Kidney:  Length: 10.6 cm. Increased renal cortical echogenicity. No mass or hydronephrosis visualized.  Left Kidney:  Length: 11.9 cm. Increased renal cortical echogenicity. There is a 6.4 x 5.3 x 4.8 cm lobulated anechoic left renal mass with increased through transmission most consistent with a cyst. No hydronephrosis visualized.  Abdominal  aorta:  No aneurysm visualized.  Other findings:  Bilateral pleural effusions.  IMPRESSION: 1. Cholelithiasis with mild gallbladder distension and mild gallbladder wall thickening concerning for cholecystitis. False-positive gallbladder wall thickening can be seen with hepatocellular disease and fluid overload states. 2. Hepatomegaly. 3. Bilateral pleural effusions.  4. Increased renal cortical echogenicity as can be seen with medical renal disease or AKI.   Electronically Signed   By: Kathreen Devoid   On: 11/26/2013 13:52   Medications:   . aspirin EC  81 mg Oral Daily  . atorvastatin  80 mg Oral q1800  . [START ON 11/28/2013] darbepoetin (ARANESP) injection - DIALYSIS  150 mcg Intravenous Q Wed-HD  . ferric gluconate (FERRLECIT/NULECIT) IV  125 mg Intravenous Q M,W,F-HD  . heparin  5,000 Units Subcutaneous 3 times per day  . insulin  aspart  0-9 Units Subcutaneous 6 times per day  . insulin detemir  10 Units Subcutaneous QHS  . lanthanum  1,000 mg Oral TID WC  . midodrine  10 mg Oral TID WC  . multivitamin  1 tablet Oral QHS  . pantoprazole  40 mg Oral Q1200  . piperacillin-tazobactam (ZOSYN)  IV  2.25 g Intravenous 3 times per day  . sodium chloride  3 mL Intravenous Q12H

## 2013-11-27 NOTE — Progress Notes (Signed)
Patient was admitted to the hospital.  She did not show for this visit

## 2013-11-27 NOTE — Progress Notes (Signed)
[facility]  Patient Status: Active (receiving services up to time of hospitalization)  [facility] is providing the following services: RN and PT  If patient discharges after hours, please call [phone].   Hannah [name] 11/27/2013, 10:02 AM

## 2013-11-27 NOTE — Progress Notes (Signed)
[facility]  HPI:43 y/o female:  The patient has previously undergone right femoral artery exposure and intervention for an ischemic leg. She developed wound complications requiring operative debridement.  Followed by medial and lateral fasciotomies.    Objective 131/78 98 98.6 F (37 C) (Oral) 18 92%  Intake/Output Summary (Last 24 hours) at 11/27/13 0944 Last data filed at 11/27/13 0600  Gross per 24 hour  Intake     27 ml  Output   2900 ml  Net  -2873 ml   Right LE cellulitis resolved Medial and lateral fasciotomy sites are healing well with a beefy red base. Right groin is healing well with beefy red base as well. No yellow eschar.   Assessment/Planning:  Right femoral artery exposure and intervention for an ischemic leg. She developed wound complications requiring operative debridement.  Followed by medial and lateral fasciotomies.  Continue wet to dry dressings BID.  I changed the dressing this am.   Laurence Slate [facility] 11/27/2013 9:44 AM --  Laboratory Lab Results:  Recent Labs  11/25/13 0903 11/26/13 1953  WBC 6.2 5.1  HGB 9.1* 10.3*  HCT 30.3* 36.0  PLT 341 372   BMET  Recent Labs  11/25/13 0903 11/26/13 1953  NA 139 138  K 5.1 4.1  CL 96 93*  CO2 28 24  GLUCOSE 152* 103*  BUN 40* 23  CREATININE 5.30* 3.51*  CALCIUM 9.4  9.5 8.3*    COAG Lab Results  Component Value Date   INR 1.24 11/25/2013   INR 1.3 11/23/2013   INR 1.6 11/21/2013   No results found for this basename: PTT

## 2013-11-27 NOTE — Progress Notes (Signed)
ANTICOAGULATION CONSULT NOTE - Initial Consult  Pharmacy Consult for Heparin Indication: history of RUE DVT  Allergies  Allergen Reactions  . Crestor [Rosuvastatin] Other (See Comments)    Severe muscle weakness  . Nsaids Other (See Comments)    Not allergic, "bad on my kidneys"  . Ciprofloxacin Rash    Patient Measurements: Height: 5\' 7"  (170.2 cm) Weight: 259 lb 12.8 oz (117.845 kg) IBW/kg (Calculated) : 61.6 Heparin Dosing Weight: 89 kg  Vital Signs: Temp: 98.6 F (37 C) (08/11 1000) Temp src: Oral (08/11 1000) BP: 97/71 mmHg (08/11 1000) Pulse Rate: 94 (08/11 1000)  Labs:  Recent Labs  11/25/13 0903 11/25/13 1038 11/26/13 1953  HGB 9.1*  --  10.3*  HCT 30.3*  --  36.0  PLT 341  --  372  LABPROT  --  15.6*  --   INR  --  1.24  --   CREATININE 5.30*  --  3.51*    Estimated Creatinine Clearance: 26.9 ml/min (by C-G formula based on Cr of 3.51).   Medical History: Past Medical History  Diagnosis Date  . High cholesterol   . Diabetic retinopathy   . Peripheral neuropathy     "tips of toes"  . Blind right eye   . CHF (congestive heart failure)   . CAD (coronary artery disease)   . GERD (gastroesophageal reflux disease)   . Hypertension   . History of lung cancer 07/2011    s/p left lower lobectomy  . Diabetes mellitus     IDDM  . Cataract of right eye   . Ulcer of toe of right foot 07/10/2012    great toe  . Breast calcification, left 06/2012  . Acute biphenotypic leukemia   . CKD (chronic kidney disease) stage 5, GFR less than 15 ml/min   . Nephrotic syndrome   . Gastritis     H/o gastritis on prior endoscopy  . Anemia   . Carotid artery disease    Assessment: Pt is a 43yo female starting heparin drip for RUE DVT while off warfarin and INR subtherapeutic at 1.24 on 8/9. HIT panel on 6/27 was negative. 8/11 Plt 372. 8/11 HIDA scan negative so no surgical interventions.   Goal of Therapy:  Heparin level 0.3-0.7 units/ml Monitor platelets by  anticoagulation protocol: Yes   Plan:  1. D/c SQH 2. Start heparin bolus at 4000 units and drip at 1300 units/hour 3. Check 8 hour HL  Sharilyn Sites M 11/27/2013,1:50 PM

## 2013-11-27 NOTE — Progress Notes (Signed)
Agree with above, had same conversation with patient

## 2013-11-27 NOTE — Progress Notes (Signed)
PROGRESS NOTE  Subjective:   Hannah [name] looks ok. Does not look toxic.   WBC is normal.   Objective:    Vital Signs:   Temp:  [97.2 F (36.2 C)-98.6 F (37 C)] 98.6 F (37 C) (08/11 1000) Pulse Rate:  [76-109] 94 (08/11 1000) Resp:  [16-18] 18 (08/11 1000) BP: (97-146)/(70-105) 97/71 mmHg (08/11 1000) SpO2:  [92 %-100 %] 96 % (08/11 1000) Weight:  [257 lb 8 oz (116.8 kg)-264 lb 15.9 oz (120.2 kg)] 259 lb 12.8 oz (117.845 kg) (08/10 2009)  Last BM Date: 11/25/13   24-hour weight change: Weight change: -7 lb 8 oz (-3.403 kg)  Weight trends: Filed Weights   11/25/13 2137 11/26/13 1805 11/26/13 2009  Weight: 266 lb 4.8 oz (120.793 kg) 257 lb 8 oz (116.8 kg) 259 lb 12.8 oz (117.845 kg)    Intake/Output:  08/10 0701 - 08/11 0700 In: 27 [P.O.:24; I.V.:3] Out: 2900      Physical Exam: BP 97/71  Pulse 94  Temp(Src) 98.6 F (37 C) (Oral)  Resp 18  Ht 5\' 7"  (1.702 m)  Wt 259 lb 12.8 oz (117.845 kg)  BMI 40.68 kg/m2  SpO2 96%  LMP 09/18/2013  Wt Readings from Last 3 Encounters:  11/26/13 259 lb 12.8 oz (117.845 kg)  11/26/13 264 lb 15.9 oz (120.2 kg)  11/09/13 272 lb 3.2 oz (123.469 kg)    General: Vital signs reviewed and noted.   Head: Normocephalic, atraumatic.  Eyes: conjunctivae/corneas clear.  EOM's intact.   Throat: normal  Neck:  thick, normal   Lungs:    clear  Heart:  RR   Abdomen:  Soft, non-tender, non-distended    Extremities: S/p fasciotomy - right lower leg   Neurologic: A&O X3, CN II - XII are grossly intact.   Psych: Normal     Labs: BMET:  Recent Labs  11/25/13 0903 11/26/13 1953  NA 139 138  K 5.1 4.1  CL 96 93*  CO2 28 24  GLUCOSE 152* 103*  BUN 40* 23  CREATININE 5.30* 3.51*  CALCIUM 9.4  9.5 8.3*  MG 2.3  --   PHOS 6.0*  --     Liver function tests:  Recent Labs  11/25/13 0903  AST 24  ALT 19  ALKPHOS 100  BILITOT 0.5  PROT 6.1  ALBUMIN 2.6*    Recent Labs  11/25/13 0903  LIPASE 32     CBC:  Recent Labs  11/25/13 0903 11/26/13 1953  WBC 6.2 5.1  NEUTROABS 4.3  --   HGB 9.1* 10.3*  HCT 30.3* 36.0  MCV 91.5 92.1  PLT 341 372    Cardiac Enzymes: No results found for this basename: CKTOTAL, CKMB, TROPONINI,  in the last 72 hours  Coagulation Studies:  Recent Labs  11/25/13 1038  LABPROT 15.6*  INR 1.24    Other: No components found with this basename: POCBNP,  No results found for this basename: DDIMER,  in the last 72 hours No results found for this basename: HGBA1C,  in the last 72 hours No results found for this basename: CHOL, HDL, LDLCALC, TRIG, CHOLHDL,  in the last 72 hours No results found for this basename: TSH, T4TOTAL, FREET3, T3FREE, THYROIDAB,  in the last 72 hours No results found for this basename: VITAMINB12, FOLATE, FERRITIN, TIBC, IRON, RETICCTPCT,  in the last 72 hours   Other results:  EKG :  Previous ant. MI.  Inf. Lat ST changes.   Medications:  Infusions:    Scheduled Medications: . aspirin EC  81 mg Oral Daily  . atorvastatin  80 mg Oral q1800  . [START ON 11/28/2013] darbepoetin (ARANESP) injection - DIALYSIS  150 mcg Intravenous Q Wed-HD  . feeding supplement (RESOURCE BREEZE)  1 Container Oral TID BM  . ferric gluconate (FERRLECIT/NULECIT) IV  125 mg Intravenous Q M,W,F-HD  . heparin  5,000 Units Subcutaneous 3 times per day  . insulin aspart  0-9 Units Subcutaneous 6 times per day  . insulin detemir  10 Units Subcutaneous QHS  . lanthanum  1,000 mg Oral TID WC  . midodrine  10 mg Oral TID WC  . multivitamin  1 tablet Oral QHS  . pantoprazole  40 mg Oral Q1200  . piperacillin-tazobactam (ZOSYN)  IV  2.25 g Intravenous 3 times per day  . sodium chloride  3 mL Intravenous Q12H    Assessment/ Plan:   Principal Problem:   Acute cholecystitis Active Problems:   Adenocarcinoma of lung   Diabetic retinopathy associated with type 2 diabetes mellitus   Hyperlipidemia   CAD- severe 3V CAD May 2015- turned down  for CABG   GERD (gastroesophageal reflux disease)   Ventricular tachycardia   Encounter for therapeutic drug monitoring   ESRD (end stage renal disease) on dialysis   Chronic combined systolic and diastolic CHF, NYHA class 3   Nausea & vomiting   Anemia of chronic disease   Cardiomyopathy, ischemic- EF 25-30% July 2015  1. CAD: no angina, cont. Same meds  2. CHF - has class 3 systolic and diastolic CHF.   Stable for now.  3. Abdominal pain:  She reported that the surgical team does not think that she will need surgery after all. She would be at high risk is she does require surgery.    No further recs.  Will sign off.  Call for questions.   Thayer Headings, Brooke Bonito., MD, [facility] 11/27/2013, 1:33 PM Office [phone] Pager [phone]

## 2013-11-27 NOTE — Progress Notes (Addendum)
PROGRESS NOTE    Hannah [name] PNT:[id number] DOB: 22-Jun-1970 DOA: 11/25/2013 PCP: Vic Blackbird, MD  HPI/Brief narrative 43 year old female, new to hemodialysis as of May 2015, complex PMH for lung cancer s/p LLL lobectomy, chronic combined CHF (EF 25-30%), HTN  , HLD, CAD, DM 2 with neuropathy, GERD, prolonged hospitalization 5/18-7/13 for PEA arrest, shock, right heart cath, carotid stenosis and right foot ischemia secondary to IABP placement which ultimately required a right femoral endarterectomy/thrombectomy/faciotomy and debridement. She also had recent admission 7/21-7/25 for RLE cellulitis. She presented on 11/25/13 with complaints of abdominal pain, nausea and vomiting that started after she had consumed some seafood. Work up shows a normal white count, normal LFTs. VSS. A non contrast CT of abdomen and pelvis showed cholelithiasis, diverticulosis (without diverticulitis), normal appearing appendix and air surrounding the bladder. Evaluating for symptomatic gallstones/biliary colic. HIDA scan negative. High risk surgical candidate and hence treating nonsurgically at this time unless a repeat 2-D echo shows significantly improved EF.   Assessment/Plan:  1. Abdominal pain: Suspicious for biliary colic. Surgery followup appreciated. On IV Zosyn. Ultrasound positive for gallstones and thickened gallbladder wall but HIDA scan negative so no cholecystitis. Currently treating nonsurgically/conservatively secondary to high risk status (see preop clearance by card 8/10) unless EF significantly improved.? DC Zosyn. Trial of clears.  2. UTI: Continue IV Zosyn pending urine culture results. 3. ESRD: Nephrology followup appreciated. On HD- last 8/10. 4. Hypertension/hypotension: Fluctuating and mildly uncontrolled. On chronic midodrine. 5. Anemia: stable. 6. History of adenocarcinoma of lung: Status post LLL lobectomy in 2013 7. Type II DM with retinopathy: Continue Levemir at current lower dose.  Good inpatient control. 8. History of hyperlipidemia: Continue statins 9. History of GERD: Continue PPI 10. Chronic combined CHF/CAD: Appears volume overloaded-volume management across hemodialysis. Cardiology input appreciated. Three-vessel disease/no targets for revascularization per Cath 5/15. LVEF 25-30% per echo 10/2013 11. Secondary hyperparathyroidism: Management per nephrology 12. History of recent right axillary DVT: Coumadin held and on heparin -DVT prophylactic dose. She apparently developed the RUE DVT 6/4 at site of her triple-lumen catheter. She also repeatedly had clotted off hemodialysis catheter. 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 She was switched to Argatroban and eventually converted to Coumadin with resolution of her prothrombotic state. Subsequent HIT panel showed negative screen and negative SRA and heparin taken off allergy list. Discussed with Pharmacy- safe to continue Heparin-Start IV heparin per pharmacy & switch to PO Coumadin when able-duration of anticoagulation 3 months post cath removal.  13. History of right lower extremity wound/cellulitis: Status post thromboembolectomy, faciotomy and several debridements. Vascular has been following. Just finished course of clindamycin. Wound care.   Code Status: Full code Family Communication: None at bedside. Disposition Plan: Home when medically stable.   Consultants:  General surgery  Nephrology  Cardiology  VVS  Procedures:  Hemodialysis  Antibiotics:  IV Zosyn   Subjective: Intermittent RUQ abdominal pain. Mild nausea. Normal BM. Denies dyspnea or chest pain. Makes small amount of urine and does complain of dysuria.  Objective: Filed Vitals:   11/26/13 1833 11/26/13 2009 11/27/13 0414 11/27/13 1000  BP: 139/84 111/77 131/78 97/71  Pulse: 76 107 98 94  Temp: 97.6 F (36.4 C) 98 F (36.7 C) 98.6 F (37 C) 98.6 F (37 C)  TempSrc: Oral Oral  Oral Oral  Resp: 16 16 18 18   Height:  5\' 7"  (1.702 m)    Weight:  117.845 kg (259 lb 12.8  oz)    SpO2: 100% 95% 92% 96%    Intake/Output Summary (Last 24 hours) at 11/27/13 1308 Last data filed at 11/27/13 0600  Gross per 24 hour  Intake     24 ml  Output   2900 ml  Net  -2876 ml   Filed Weights   11/25/13 2137 11/26/13 1805 11/26/13 2009  Weight: 120.793 kg (266 lb 4.8 oz) 116.8 kg (257 lb 8 oz) 117.845 kg (259 lb 12.8 oz)     Exam:  General exam: Pleasant young female sitting propped up in bed this morning. Respiratory system: Diminished breath sounds in the bases but otherwise clear to auscultation. No increased work of breathing. Cardiovascular system: S1 & S2 heard, RRR. No JVD, murmurs, gallops, clicks. 1+ pitting bilateral leg edema. Gastrointestinal system: Abdomen is nondistended, soft. Mild right upper quadrant tenderness without rigidity, guarding or rebound. Normal bowel sounds heard. Central nervous system: Alert and oriented. No focal neurological deficits. Extremities: Symmetric 5 x 5 power. Dressings over her right leg clean, dry and intact.   Data Reviewed: Basic Metabolic Panel:  Recent Labs Lab 11/25/13 0903 11/26/13 1953  NA 139 138  K 5.1 4.1  CL 96 93*  CO2 28 24  GLUCOSE 152* 103*  BUN 40* 23  CREATININE 5.30* 3.51*  CALCIUM 9.4  9.5 8.3*  MG 2.3  --   PHOS 6.0*  --    Liver Function Tests:  Recent Labs Lab 11/25/13 0903  AST 24  ALT 19  ALKPHOS 100  BILITOT 0.5  PROT 6.1  ALBUMIN 2.6*    Recent Labs Lab 11/25/13 0903  LIPASE 32   No results found for this basename: AMMONIA,  in the last 168 hours CBC:  Recent Labs Lab 11/25/13 0903 11/26/13 1953  WBC 6.2 5.1  NEUTROABS 4.3  --   HGB 9.1* 10.3*  HCT 30.3* 36.0  MCV 91.5 92.1  PLT 341 372   Cardiac Enzymes: No results found for this basename: CKTOTAL, CKMB, CKMBINDEX, TROPONINI,  in the last 168 hours BNP (last 3 results)  Recent Labs  08/13/13 1949  09/06/13 0740  PROBNP 34187.0* 28622.0*   CBG:  Recent Labs Lab 11/26/13 2008 11/26/13 2355 11/27/13 0412 11/27/13 0731 11/27/13 1144  GLUCAP 148* 157* 122* 89 95    No results found for this or any previous visit (from the past 240 hour(s)).     Studies: Nm Hepatobiliary  11/27/2013   CLINICAL DATA:  Evaluate for cholecystitis. Right upper quadrant pain, nausea and vomiting 5 days.  EXAM: NUCLEAR MEDICINE HEPATOBILIARY IMAGING  TECHNIQUE: Sequential images of the abdomen were obtained out to 60 minutes following intravenous administration of radiopharmaceutical.  RADIOPHARMACEUTICALS:  5.0 [employer] SE-83T Choletec  COMPARISON:  Ultrasound 11/26/2013 and CT 11/25/2013  FINDINGS: Examination demonstrates normal uniform uptake of radiotracer by the liver. There is normal gallbladder filling and biliary to bowel transit of radiotracer at 20 min.  IMPRESSION: Normal hepatobiliary scan without evidence of cholecystitis.   Electronically Signed   By: Marin Olp M.D.   On: 11/27/2013 11:19   US Abdomen Complete  11/26/2013   CLINICAL DATA:  Evaluate for cholecystitis, right upper quadrant pain  EXAM: ULTRASOUND ABDOMEN COMPLETE  COMPARISON:  None.  FINDINGS: Gallbladder:  There are cholelithiasis. The gallbladder is distended. There is mild gallbladder wall thickening measuring 3.4 mm. No sonographic Murphy sign noted.  Common bile duct:  Diameter: 2.7 mm  Liver:  No focal lesion identified. The liver is enlarged. The echotexture  is heterogeneous.  IVC:  No abnormality visualized.  Pancreas:  Visualized portion unremarkable.  Spleen:  Size and appearance within normal limits.  Right Kidney:  Length: 10.6 cm. Increased renal cortical echogenicity. No mass or hydronephrosis visualized.  Left Kidney:  Length: 11.9 cm. Increased renal cortical echogenicity. There is a 6.4 x 5.3 x 4.8 cm lobulated anechoic left renal mass with increased through transmission most consistent with a cyst. No  hydronephrosis visualized.  Abdominal aorta:  No aneurysm visualized.  Other findings:  Bilateral pleural effusions.  IMPRESSION: 1. Cholelithiasis with mild gallbladder distension and mild gallbladder wall thickening concerning for cholecystitis. False-positive gallbladder wall thickening can be seen with hepatocellular disease and fluid overload states. 2. Hepatomegaly. 3. Bilateral pleural effusions.  4. Increased renal cortical echogenicity as can be seen with medical renal disease or AKI.   Electronically Signed   By: Kathreen Devoid   On: 11/26/2013 13:52        Scheduled Meds: . aspirin EC  81 mg Oral Daily  . atorvastatin  80 mg Oral q1800  . [START ON 11/28/2013] darbepoetin (ARANESP) injection - DIALYSIS  150 mcg Intravenous Q Wed-HD  . feeding supplement (RESOURCE BREEZE)  1 Container Oral TID BM  . ferric gluconate (FERRLECIT/NULECIT) IV  125 mg Intravenous Q M,W,F-HD  . heparin  5,000 Units Subcutaneous 3 times per day  . insulin aspart  0-9 Units Subcutaneous 6 times per day  . insulin detemir  10 Units Subcutaneous QHS  . lanthanum  1,000 mg Oral TID WC  . midodrine  10 mg Oral TID WC  . multivitamin  1 tablet Oral QHS  . pantoprazole  40 mg Oral Q1200  . piperacillin-tazobactam (ZOSYN)  IV  2.25 g Intravenous 3 times per day  . sodium chloride  3 mL Intravenous Q12H   Continuous Infusions:   Principal Problem:   Acute cholecystitis Active Problems:   Adenocarcinoma of lung   Diabetic retinopathy associated with type 2 diabetes mellitus   Hyperlipidemia   CAD- severe 3V CAD May 2015- turned down for CABG   GERD (gastroesophageal reflux disease)   Ventricular tachycardia   Encounter for therapeutic drug monitoring   ESRD (end stage renal disease) on dialysis   Chronic combined systolic and diastolic CHF, NYHA class 3   Nausea & vomiting   Anemia of chronic disease   Cardiomyopathy, ischemic- EF 25-30% July 2015    Time spent: 45 minutes.    Vernell Leep, MD,  FACP, FHM. [facility] Pager [phone]  If 7PM-7AM, please contact night-coverage [email or web address] Password [email or web address] 11/27/2013, 1:08 PM    LOS: 2 days

## 2013-11-28 DIAGNOSIS — D638 Anemia in other chronic diseases classified elsewhere: Secondary | ICD-10-CM

## 2013-11-28 LAB — URINE CULTURE: Colony Count: 60000

## 2013-11-28 LAB — PROTIME-INR
INR: 2.49 — AB (ref 0.00–1.49)
INR: 2.44 — ABNORMAL HIGH (ref 0.00–1.49)
PROTHROMBIN TIME: 26.9 s — AB (ref 11.6–15.2)
Prothrombin Time: 26.5 seconds — ABNORMAL HIGH (ref 11.6–15.2)

## 2013-11-28 LAB — RENAL FUNCTION PANEL
Albumin: 2.3 g/dL — ABNORMAL LOW (ref 3.5–5.2)
Anion gap: 15 (ref 5–15)
BUN: 36 mg/dL — ABNORMAL HIGH (ref 6–23)
CO2: 27 mEq/L (ref 19–32)
Calcium: 8.3 mg/dL — ABNORMAL LOW (ref 8.4–10.5)
Chloride: 93 mEq/L — ABNORMAL LOW (ref 96–112)
Creatinine, Ser: 5.64 mg/dL — ABNORMAL HIGH (ref 0.50–1.10)
GFR calc Af Amer: 10 mL/min — ABNORMAL LOW (ref >90)
GFR calc non Af Amer: 8 mL/min — ABNORMAL LOW (ref >90)
Glucose, Bld: 113 mg/dL — ABNORMAL HIGH (ref 70–99)
Phosphorus: 6.2 mg/dL — ABNORMAL HIGH (ref 2.3–4.6)
Potassium: 4.3 mEq/L (ref 3.7–5.3)
Sodium: 135 mEq/L — ABNORMAL LOW (ref 137–147)

## 2013-11-28 LAB — CBC
HEMATOCRIT: 30.2 % — AB (ref 36.0–46.0)
Hemoglobin: 9.1 g/dL — ABNORMAL LOW (ref 12.0–15.0)
MCH: 27.3 pg (ref 26.0–34.0)
MCHC: 30.1 g/dL (ref 30.0–36.0)
MCV: 90.7 fL (ref 78.0–100.0)
Platelets: 317 10*3/uL (ref 150–400)
RBC: 3.33 MIL/uL — ABNORMAL LOW (ref 3.87–5.11)
RDW: 19.8 % — AB (ref 11.5–15.5)
WBC: 4.7 10*3/uL (ref 4.0–10.5)

## 2013-11-28 LAB — GLUCOSE, CAPILLARY
GLUCOSE-CAPILLARY: 123 mg/dL — AB (ref 70–99)
GLUCOSE-CAPILLARY: 229 mg/dL — AB (ref 70–99)
Glucose-Capillary: 144 mg/dL — ABNORMAL HIGH (ref 70–99)
Glucose-Capillary: 221 mg/dL — ABNORMAL HIGH (ref 70–99)

## 2013-11-28 LAB — HEPARIN LEVEL (UNFRACTIONATED)
HEPARIN UNFRACTIONATED: 0.37 [IU]/mL (ref 0.30–0.70)
Heparin Unfractionated: 0.18 IU/mL — ABNORMAL LOW (ref 0.30–0.70)

## 2013-11-28 MED ORDER — WARFARIN - PHARMACIST DOSING INPATIENT: Freq: Every day | Status: DC

## 2013-11-28 MED ORDER — MORPHINE SULFATE 2 MG/ML IJ SOLN
INTRAMUSCULAR | Status: AC
  Filled 2013-11-28: qty 1

## 2013-11-28 MED ORDER — MIDODRINE HCL 5 MG PO TABS
ORAL_TABLET | ORAL | Status: AC
  Filled 2013-11-28: qty 2

## 2013-11-28 MED ORDER — URSODIOL 300 MG PO CAPS
300.0000 mg | ORAL_CAPSULE | Freq: Every day | ORAL | Status: DC
  Administered 2013-11-28 – 2013-11-29 (×2): 300 mg via ORAL
  Filled 2013-11-28 (×2): qty 1

## 2013-11-28 MED ORDER — HEPARIN BOLUS VIA INFUSION
2000.0000 [IU] | Freq: Once | INTRAVENOUS | Status: AC
  Administered 2013-11-28: 2000 [IU] via INTRAVENOUS
  Filled 2013-11-28: qty 2000

## 2013-11-28 MED ORDER — WARFARIN SODIUM 1 MG PO TABS
1.0000 mg | ORAL_TABLET | Freq: Once | ORAL | Status: AC
  Administered 2013-11-28: 1 mg via ORAL
  Filled 2013-11-28: qty 1

## 2013-11-28 NOTE — Progress Notes (Signed)
Patient ID: Hannah [name], female   DOB: [birth year], 43 y.o.   MRN: [id number]     [facility]      [address] [place], [place] [address]    Phone: [phone] FAX: [phone]     Subjective: Tolerating clears.  Still having abdominal pain.  HIDA scan negative for cholecystitis.    Objective:  Vital signs:  Filed Vitals:   11/28/13 0657 11/28/13 0730 11/28/13 0800 11/28/13 0829  BP: 124/82 117/80 112/88 121/86  Pulse: 86 86 102 96  Temp:      TempSrc:      Resp:      Height:      Weight:      SpO2:        Last BM Date: 11/25/13  Intake/Output   Yesterday:  08/11 0701 - 08/12 0700 In: 554.5 [P.O.:480; I.V.:74.5] Out: -  This shift:    I/O last 3 completed shifts: In: 578.5 [P.O.:504; I.V.:74.5] Out: -     Physical Exam: General: Pt awake/alert/oriented x4 in no acute distress Abdomen: Soft.  Nondistended.  TTP RUQ.   No evidence of peritonitis.  No incarcerated hernias.    Problem List:   Principal Problem:   Acute cholecystitis Active Problems:   Adenocarcinoma of lung   Diabetic retinopathy associated with type 2 diabetes mellitus   Hyperlipidemia   CAD- severe 3V CAD May 2015- turned down for CABG   GERD (gastroesophageal reflux disease)   Ventricular tachycardia   Encounter for therapeutic drug monitoring   ESRD (end stage renal disease) on dialysis   Chronic combined systolic and diastolic CHF, NYHA class 3   Nausea & vomiting   Anemia of chronic disease   Cardiomyopathy, ischemic- EF 25-30% July 2015    Results:   Labs: Results for orders placed during the hospital encounter of 11/25/13 (from the past 48 hour(s))  GLUCOSE, CAPILLARY     Status: Abnormal   Collection Time    11/26/13 12:51 PM      Result Value Ref Range   Glucose-Capillary 102 (*) 70 - 99 mg/dL   Comment 1 Notify RN    GLUCOSE, CAPILLARY     Status: None   Collection Time    11/26/13  6:39 PM      Result Value Ref  Range   Glucose-Capillary 81  70 - 99 mg/dL  CBC     Status: Abnormal   Collection Time    11/26/13  7:53 PM      Result Value Ref Range   WBC 5.1  4.0 - 10.5 K/uL   RBC 3.91  3.87 - 5.11 MIL/uL   Hemoglobin 10.3 (*) 12.0 - 15.0 g/dL   HCT 36.0  36.0 - 46.0 %   MCV 92.1  78.0 - 100.0 fL   MCH 26.3  26.0 - 34.0 pg   MCHC 28.6 (*) 30.0 - 36.0 g/dL   RDW 20.3 (*) 11.5 - 15.5 %   Platelets 372  150 - 400 K/uL  BASIC METABOLIC PANEL     Status: Abnormal   Collection Time    11/26/13  7:53 PM      Result Value Ref Range   Sodium 138  137 - 147 mEq/L   Potassium 4.1  3.7 - 5.3 mEq/L   Chloride 93 (*) 96 - 112 mEq/L   CO2 24  19 - 32 mEq/L   Glucose, Bld 103 (*) 70 - 99 mg/dL   BUN  23  6 - 23 mg/dL   Comment: DELTA CHECK NOTED   Creatinine, Ser 3.51 (*) 0.50 - 1.10 mg/dL   Comment: DELTA CHECK NOTED   Calcium 8.3 (*) 8.4 - 10.5 mg/dL   GFR calc non Af Amer 15 (*) >90 mL/min   GFR calc Af Amer 17 (*) >90 mL/min   Comment: (NOTE)     The eGFR has been calculated using the CKD EPI equation.     This calculation has not been validated in all clinical situations.     eGFR's persistently <90 mL/min signify possible Chronic Kidney     Disease.   Anion gap 21 (*) 5 - 15  GLUCOSE, CAPILLARY     Status: Abnormal   Collection Time    11/26/13  8:08 PM      Result Value Ref Range   Glucose-Capillary 148 (*) 70 - 99 mg/dL  GLUCOSE, CAPILLARY     Status: Abnormal   Collection Time    11/26/13 11:55 PM      Result Value Ref Range   Glucose-Capillary 157 (*) 70 - 99 mg/dL  GLUCOSE, CAPILLARY     Status: Abnormal   Collection Time    11/27/13  4:12 AM      Result Value Ref Range   Glucose-Capillary 122 (*) 70 - 99 mg/dL  GLUCOSE, CAPILLARY     Status: None   Collection Time    11/27/13  7:31 AM      Result Value Ref Range   Glucose-Capillary 89  70 - 99 mg/dL  GLUCOSE, CAPILLARY     Status: None   Collection Time    11/27/13 11:44 AM      Result Value Ref Range   Glucose-Capillary  95  70 - 99 mg/dL  GLUCOSE, CAPILLARY     Status: Abnormal   Collection Time    11/27/13  5:09 PM      Result Value Ref Range   Glucose-Capillary 176 (*) 70 - 99 mg/dL  GLUCOSE, CAPILLARY     Status: Abnormal   Collection Time    11/27/13  7:59 PM      Result Value Ref Range   Glucose-Capillary 253 (*) 70 - 99 mg/dL  HEPARIN LEVEL (UNFRACTIONATED)     Status: Abnormal   Collection Time    11/27/13 11:30 PM      Result Value Ref Range   Heparin Unfractionated <0.10 (*) 0.30 - 0.70 IU/mL   Comment:            IF HEPARIN RESULTS ARE BELOW     EXPECTED VALUES, AND PATIENT     DOSAGE HAS BEEN CONFIRMED,     SUGGEST FOLLOW UP TESTING     OF ANTITHROMBIN III LEVELS.  GLUCOSE, CAPILLARY     Status: Abnormal   Collection Time    11/27/13 11:54 PM      Result Value Ref Range   Glucose-Capillary 193 (*) 70 - 99 mg/dL  GLUCOSE, CAPILLARY     Status: Abnormal   Collection Time    11/28/13  4:02 AM      Result Value Ref Range   Glucose-Capillary 144 (*) 70 - 99 mg/dL  RENAL FUNCTION PANEL     Status: Abnormal   Collection Time    11/28/13  6:58 AM      Result Value Ref Range   Sodium 135 (*) 137 - 147 mEq/L   Potassium 4.3  3.7 - 5.3 mEq/L   Chloride 93 (*) 96 -  112 mEq/L   CO2 27  19 - 32 mEq/L   Glucose, Bld 113 (*) 70 - 99 mg/dL   BUN 36 (*) 6 - 23 mg/dL   Comment: DELTA CHECK NOTED   Creatinine, Ser 5.64 (*) 0.50 - 1.10 mg/dL   Comment: DELTA CHECK NOTED   Calcium 8.3 (*) 8.4 - 10.5 mg/dL   Phosphorus 6.2 (*) 2.3 - 4.6 mg/dL   Albumin 2.3 (*) 3.5 - 5.2 g/dL   GFR calc non Af Amer 8 (*) >90 mL/min   GFR calc Af Amer 10 (*) >90 mL/min   Comment: (NOTE)     The eGFR has been calculated using the CKD EPI equation.     This calculation has not been validated in all clinical situations.     eGFR's persistently <90 mL/min signify possible Chronic Kidney     Disease.   Anion gap 15  5 - 15  CBC     Status: Abnormal   Collection Time    11/28/13  7:10 AM      Result Value Ref  Range   WBC 4.7  4.0 - 10.5 K/uL   RBC 3.33 (*) 3.87 - 5.11 MIL/uL   Hemoglobin 9.1 (*) 12.0 - 15.0 g/dL   HCT 30.2 (*) 36.0 - 46.0 %   MCV 90.7  78.0 - 100.0 fL   MCH 27.3  26.0 - 34.0 pg   MCHC 30.1  30.0 - 36.0 g/dL   RDW 19.8 (*) 11.5 - 15.5 %   Platelets 317  150 - 400 K/uL  HEPARIN LEVEL (UNFRACTIONATED)     Status: None   Collection Time    11/28/13  8:10 AM      Result Value Ref Range   Heparin Unfractionated 0.37  0.30 - 0.70 IU/mL   Comment:            IF HEPARIN RESULTS ARE BELOW     EXPECTED VALUES, AND PATIENT     DOSAGE HAS BEEN CONFIRMED,     SUGGEST FOLLOW UP TESTING     OF ANTITHROMBIN III LEVELS.    Imaging / Studies: Nm Hepatobiliary  11/27/2013   CLINICAL DATA:  Evaluate for cholecystitis. Right upper quadrant pain, nausea and vomiting 5 days.  EXAM: NUCLEAR MEDICINE HEPATOBILIARY IMAGING  TECHNIQUE: Sequential images of the abdomen were obtained out to 60 minutes following intravenous administration of radiopharmaceutical.  RADIOPHARMACEUTICALS:  5.0 [employer] WF-09N Choletec  COMPARISON:  Ultrasound 11/26/2013 and CT 11/25/2013  FINDINGS: Examination demonstrates normal uniform uptake of radiotracer by the liver. There is normal gallbladder filling and biliary to bowel transit of radiotracer at 20 min.  IMPRESSION: Normal hepatobiliary scan without evidence of cholecystitis.   Electronically Signed   By: Marin Olp M.D.   On: 11/27/2013 11:19   US Abdomen Complete  11/26/2013   CLINICAL DATA:  Evaluate for cholecystitis, right upper quadrant pain  EXAM: ULTRASOUND ABDOMEN COMPLETE  COMPARISON:  None.  FINDINGS: Gallbladder:  There are cholelithiasis. The gallbladder is distended. There is mild gallbladder wall thickening measuring 3.4 mm. No sonographic Murphy sign noted.  Common bile duct:  Diameter: 2.7 mm  Liver:  No focal lesion identified. The liver is enlarged. The echotexture is heterogeneous.  IVC:  No abnormality visualized.  Pancreas:  Visualized  portion unremarkable.  Spleen:  Size and appearance within normal limits.  Right Kidney:  Length: 10.6 cm. Increased renal cortical echogenicity. No mass or hydronephrosis visualized.  Left Kidney:  Length: 11.9 cm. Increased renal  cortical echogenicity. There is a 6.4 x 5.3 x 4.8 cm lobulated anechoic left renal mass with increased through transmission most consistent with a cyst. No hydronephrosis visualized.  Abdominal aorta:  No aneurysm visualized.  Other findings:  Bilateral pleural effusions.  IMPRESSION: 1. Cholelithiasis with mild gallbladder distension and mild gallbladder wall thickening concerning for cholecystitis. False-positive gallbladder wall thickening can be seen with hepatocellular disease and fluid overload states. 2. Hepatomegaly. 3. Bilateral pleural effusions.  4. Increased renal cortical echogenicity as can be seen with medical renal disease or AKI.   Electronically Signed   By: Kathreen Devoid   On: 11/26/2013 13:52    Medications / Allergies:  Scheduled Meds: . aspirin EC  81 mg Oral Daily  . atorvastatin  80 mg Oral q1800  . darbepoetin (ARANESP) injection - DIALYSIS  150 mcg Intravenous Q Wed-HD  . feeding supplement (RESOURCE BREEZE)  1 Container Oral TID BM  . ferric gluconate (FERRLECIT/NULECIT) IV  125 mg Intravenous Q M,W,F-HD  . insulin aspart  0-9 Units Subcutaneous 6 times per day  . insulin detemir  10 Units Subcutaneous QHS  . lanthanum  1,000 mg Oral TID WC  . midodrine  10 mg Oral TID WC  . multivitamin  1 tablet Oral QHS  . pantoprazole  40 mg Oral Q1200  . piperacillin-tazobactam (ZOSYN)  IV  2.25 g Intravenous 3 times per day  . sodium chloride  3 mL Intravenous Q12H   Continuous Infusions: . heparin 1,650 Units/hr (11/28/13 0722)   PRN Meds:.sodium chloride, acetaminophen, acetaminophen, morphine injection, ondansetron (ZOFRAN) IV, ondansetron, polyvinyl alcohol, sodium chloride  Antibiotics: Anti-infectives   Start     Dose/Rate Route Frequency  Ordered Stop   11/26/13 0200  piperacillin-tazobactam (ZOSYN) IVPB 2.25 g  Status:  Discontinued     2.25 g 100 mL/hr over 30 Minutes Intravenous Every 8 hours 11/25/13 1556 11/25/13 1805   11/25/13 2200  piperacillin-tazobactam (ZOSYN) IVPB 2.25 g     2.25 g 100 mL/hr over 30 Minutes Intravenous 3 times per day 11/25/13 1805     11/25/13 1600  piperacillin-tazobactam (ZOSYN) IVPB 3.375 g  Status:  Discontinued     3.375 g 100 mL/hr over 30 Minutes Intravenous  Once 11/25/13 1555 11/25/13 1805   11/25/13 1245  piperacillin-tazobactam (ZOSYN) IVPB 3.375 g     3.375 g 12.5 mL/hr over 240 Minutes Intravenous  Once 11/25/13 1238 11/25/13 1402      Assessment/Plan 1. Biliary colic  2. Cholelithiasis  3. End-stage renal disease on hemodialysis  4. Diabetes mellitus  5. Hypertension  6. History of lung cancer  7. Coronary artery disease  The patient is not a surgical candidate given her cardiac history.  Could consider starting Ursodiol which may help dissolve the stones over time.  Advance to a low fat diet and maintain a low fat diet and lastly pain control.   Should she developed acute cholecystitis a cholecystostomy tube would be the treatment, however, at this point in time it would not help her since HIDA scan was negative for cholecystitis. Can stop antibiotics from surgical standpoint.  I discussed this with the patient in detail, she verbalizes understanding.     Erby Pian, [facility] Pager [phone] Office [phone]  11/28/2013 9:38 AM

## 2013-11-28 NOTE — Progress Notes (Signed)
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 She also had recent admission 7/21-7/25 for RLE cellulitis. She presented on 11/25/13 with complaints of abdominal pain, nausea and vomiting that started after she had consumed some seafood.  Work up shows a normal white count, normal LFTs. VSS. A non contrast CT of abdomen and pelvis showed cholelithiasis, diverticulosis (without diverticulitis), normal appearing appendix and air surrounding the bladder. Evaluating for symptomatic gallstones/biliary colic. HIDA scan negative. High risk surgical candidate and hence treating nonsurgically at this time unless a repeat 2-D echo shows significantly improved EF.    Assessment/Plan: Principal Problem:   Acute biliary colic - Abdominal pain improving, surgery following, - Ultrasound positive for gallstones, thickened gallbladder wall but HIDA scan negative - Per general surgery, high risk for surgery, recommended low-fat diet, pain control, possibly ursodiol. Will DC antibiotics  Active Problems: UTI - Urine culture showed only 60,000 colonies, discontinued Zosyn  ESRD - Nephrology following, on HD MWF  History of  Adenocarcinoma of lung - Status post LLL lobectomy in 2013    Diabetic retinopathy associated with type 2 diabetes mellitus - Continue Levemir, controlled    CAD- severe 3V CAD May 2015- turned down for CABG - Volume management with hemodialysis. Cardiology input appreciated.  Three-vessel disease/no targets for revascularization per Cath 5/15. LVEF 25-30% per echo 10/2013    GERD (gastroesophageal reflux disease):  - Continue PPI  History of recent right axillary DVT:  - Coumadin held and on heparin -DVT prophylactic dose. She apparently developed the RUE DVT 6/4 at site of her triple-lumen catheter. She also repeatedly had clotted off hemodialysis catheter. 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 She was switched to Argatroban and eventually converted to Coumadin with resolution of her prothrombotic state. Subsequent HIT panel showed negative screen and negative SRA and heparin taken off allergy list - No plans of surgery, will restart Coumadin-duration of anticoagulation 3 months post cath removal  History of right lower extremity wound/cellulitis: Status post thromboembolectomy, faciotomy and several debridements. Vascular has been following. Just finished course of clindamycin. Wound care.  DVT Prophylaxis: Heparin drip  Code Status:  Family Communication:  Disposition:  Consultants:  Nephrology  General surgery  Procedures:  Hemodialysis  Antibiotics:  IV Zosyn    Subjective: Patient seen and examined during hemodialysis, abdominal pain improving  Objective: Weight change: 2.995 kg (6 lb 9.6 oz)  Intake/Output Summary (Last 24 hours) at 11/28/13 1219 Last data filed at 11/28/13 1147  Gross per 24 hour  Intake 914.53 ml  Output   3400 ml  Net -2485.47 ml   Blood pressure 101/80, pulse 98, temperature 97.7 F (36.5 C), temperature source Oral, resp. rate 18, height 5\' 7"  (1.702 m), weight 116.7 kg (257 lb 4.4 oz), last menstrual period 09/18/2013, SpO2 100.00%.  Physical Exam: General: Alert and awake, oriented x3, not in any acute distress. CVS: S1-S2 clear, no murmur rubs or gallops Chest: clear to auscultation bilaterally, no wheezing, rales or rhonchi  Abdomen: Obese, soft  right upper quadrant tenderness , normal bowel sounds  Extremities: b/l edema, R>L. Significant right lower extremity cellulitis with wounds,    Lab Results: Basic Metabolic Panel:  Recent Labs Lab 11/25/13 0903 11/26/13 1953 11/28/13 0658  NA 139 138 135*  K 5.1 4.1 4.3  CL 96 93* 93*  CO2 28 24 27   GLUCOSE 152* 103* 113*  BUN 40* 23 36*  CREATININE 5.30* 3.51* 5.64*  CALCIUM 9.4  9.5 8.3* 8.3*  MG 2.3  --   --   PHOS 6.0*  --  6.2*   Liver Function Tests:  Recent Labs Lab 11/25/13 0903 11/28/13 0658  AST 24  --   ALT 19  --   ALKPHOS 100  --   BILITOT 0.5  --   PROT 6.1  --   ALBUMIN 2.6* 2.3*    Recent Labs Lab 11/25/13 0903  LIPASE 32   No results found for this basename: AMMONIA,  in the last 168 hours CBC:  Recent Labs Lab 11/25/13 0903 11/26/13 1953 11/28/13 0710  WBC 6.2 5.1 4.7  NEUTROABS 4.3  --   --   HGB 9.1* 10.3* 9.1*  HCT 30.3* 36.0 30.2*  MCV 91.5 92.1 90.7  PLT 341 372 317   Cardiac Enzymes: No results found for this basename: CKTOTAL, CKMB, CKMBINDEX, TROPONINI,  in the last 168 hours BNP: No components found with this basename: POCBNP,  CBG:  Recent Labs Lab 11/27/13 1709 11/27/13 1959 11/27/13 2354 11/28/13 0402 11/28/13 1142  GLUCAP 176* 253* 193* 144* 123*     Micro Results: Recent Results (from the past 240 hour(s))  URINE CULTURE     Status: None   Collection Time    11/27/13  2:06 AM      Result Value Ref Range Status   Specimen Description URINE, RANDOM   Final   Special Requests NONE   Final   Culture  Setup Time     Final   Value: 11/27/2013 03:09     Performed at [facility]     Final   Value: 60,000 COLONIES/ML     Performed at [facility]   Culture     Final   Value: Multiple bacterial morphotypes present, none predominant. Suggest appropriate recollection if clinically indicated.     Performed at [facility]   Report Status 11/28/2013 FINAL   Final     Studies/Results: Ct Abdomen Pelvis Wo Contrast  11/25/2013   CLINICAL DATA:  Diffuse abdominal pain. Nausea, vomiting since Friday. Dialysis patient. History of CHF, CAD, GERD, hypertension, diabetes. History of lung cancer, chronic kidney disease, gastritis. Previous tubal ligation and lobectomy. Recent cardiac catheterization.  EXAM: CT ABDOMEN AND PELVIS WITHOUT CONTRAST  TECHNIQUE: Multidetector CT imaging of the abdomen and pelvis was performed following the standard protocol without IV contrast.  COMPARISON:  Ultrasound the abdomen 12/05/2012 and 06/01/2011 and PET-CT on 07/28/2011  FINDINGS: Lower chest: There bilateral pleural effusions. Heart is mildly enlarged. There are coronary artery calcifications. Trace pericardial effusion is present.  Upper abdomen: No focal abnormality identified within the liver, is clean spleen, pancreas, or adrenal glands. A left renal cyst is 5.6 cm. There are calcified dependent gallstones within the gallbladder. No other evidence for acute cholecystitis.  Bowel: The stomach and small bowel loops have a normal appearance. The appendix is well seen and has a normal appearance. There are numerous colonic diverticula but no evidence for acute diverticulitis.  Pelvis: There are small locules of gas surrounding the urinary bladder. The may be a small amount of gas within the dome of the bladder. Question of recent catheterization or other bladder procedure. No pelvic abscess. The uterus is present. No adnexal mass.Small amount of free fluid.  Abdominal wall: There is soft tissue defect in the right lower quadrant consistent with recent surgery. There is diffuse body wall edema. Small right inguinal lymph nodes are noted. These may be reactive.  Osseous structures: Unremarkable.  IMPRESSION: 1. Small amount of gas surrounding the urinary bladder, suspicious for infection or perforation of the urinary bladder. Question of any recent bladder procedures. 2. Soft tissue defect in  the right lower quadrant of the abdomen consistent with recent surgery. 3. Body wall edema. 4. Cardiomegaly. 5. Coronary vascular calcifications. 6. Trace pericardial effusion. 7. Cholelithiasis. 8. Left renal cyst. The findings were discussed with Virgie Dad on 11/25/2013 at 12:16 pm.   Electronically Signed   By: Shon Hale M.D.   On: 11/25/2013 12:18   Nm Hepatobiliary  11/27/2013   CLINICAL DATA:  Evaluate for cholecystitis. Right upper quadrant pain, nausea and vomiting 5 days.  EXAM: NUCLEAR MEDICINE HEPATOBILIARY IMAGING  TECHNIQUE: Sequential images of the abdomen were obtained out to 60 minutes following intravenous administration of radiopharmaceutical.  RADIOPHARMACEUTICALS:  5.0 Millicurie [id number]-95A Choletec  COMPARISON:  Ultrasound 11/26/2013 and CT 11/25/2013  FINDINGS: Examination demonstrates normal uniform uptake of radiotracer by the liver. There is normal gallbladder filling and biliary to bowel transit of radiotracer at 20 min.  IMPRESSION: Normal hepatobiliary scan without evidence of cholecystitis.   Electronically Signed   By: Marin Olp M.D.   On: 11/27/2013 11:19   US Abdomen Complete  11/26/2013   CLINICAL DATA:  Evaluate for cholecystitis, right upper quadrant pain  EXAM: ULTRASOUND ABDOMEN COMPLETE  COMPARISON:  None.  FINDINGS: Gallbladder:  There are cholelithiasis. The gallbladder is distended. There is mild gallbladder wall thickening measuring 3.4 mm. No sonographic Murphy sign noted.  Common bile duct:  Diameter: 2.7 mm  Liver:  No focal lesion identified. The liver is enlarged. The echotexture is heterogeneous.  IVC:  No abnormality visualized.  Pancreas:  Visualized portion unremarkable.  Spleen:  Size and appearance within normal limits.  Right Kidney:  Length: 10.6 cm. Increased renal cortical echogenicity. No mass or hydronephrosis visualized.  Left Kidney:  Length: 11.9 cm. Increased renal cortical echogenicity. There is a 6.4 x 5.3 x 4.8 cm lobulated anechoic left  renal mass with increased through transmission most consistent with a cyst. No hydronephrosis visualized.  Abdominal aorta:  No aneurysm visualized.  Other findings:  Bilateral pleural effusions.  IMPRESSION: 1. Cholelithiasis with mild gallbladder distension and mild gallbladder wall thickening concerning for cholecystitis. False-positive gallbladder wall thickening can be seen with hepatocellular disease and fluid overload states. 2. Hepatomegaly. 3. Bilateral pleural effusions.  4. Increased renal cortical echogenicity as can be seen with medical renal disease or AKI.   Electronically Signed   By: Kathreen Devoid   On: 11/26/2013 13:52    Medications: Scheduled Meds: . aspirin EC  81 mg Oral Daily  . atorvastatin  80 mg Oral q1800  . darbepoetin (ARANESP) injection - DIALYSIS  150 mcg Intravenous Q Wed-HD  . feeding supplement (RESOURCE BREEZE)  1 Container Oral TID BM  . ferric gluconate (FERRLECIT/NULECIT) IV  125 mg Intravenous Q M,W,F-HD  . insulin aspart  0-9 Units Subcutaneous 6 times per day  . insulin detemir  10 Units  Subcutaneous QHS  . lanthanum  1,000 mg Oral TID WC  . midodrine  10 mg Oral TID WC  . multivitamin  1 tablet Oral QHS  . pantoprazole  40 mg Oral Q1200  . piperacillin-tazobactam (ZOSYN)  IV  2.25 g Intravenous 3 times per day  . sodium chloride  3 mL Intravenous Q12H      LOS: 3 days   Yonathan Perrow M.D. [facility] 11/28/2013, 12:19 PM Pager: [phone]  If 7PM-7AM, please contact night-coverage [email or web address] Password [email or web address]  **Disclaimer: This note was dictated with voice recognition software. Similar sounding words can inadvertently be transcribed and this note may contain transcription errors which may not have been corrected upon publication of note.**

## 2013-11-28 NOTE — Progress Notes (Addendum)
ANTICOAGULATION CONSULT NOTE - Follow Up Consult  Pharmacy Consult for heparin Indication: hx RUE DVT  Allergies  Allergen Reactions  . Crestor [Rosuvastatin] Other (See Comments)    Severe muscle weakness  . Nsaids Other (See Comments)    Not allergic, "bad on my kidneys"  . Ciprofloxacin Rash    Patient Measurements: Height: 5\' 7"  (170.2 cm) Weight: 264 lb 8.8 oz (120 kg) IBW/kg (Calculated) : 61.6 Heparin Dosing Weight: 89 kg  Vital Signs: Temp: 98 F (36.7 C) (08/12 0652) Temp src: Oral (08/12 0652) BP: 121/67 mmHg (08/12 1030) Pulse Rate: 85 (08/12 1030)  Labs:  Recent Labs  11/25/13 1038 11/26/13 1953 11/27/13 2330 11/28/13 0658 11/28/13 0710 11/28/13 0810  HGB  --  10.3*  --   --  9.1*  --   HCT  --  36.0  --   --  30.2*  --   PLT  --  372  --   --  317  --   LABPROT 15.6*  --   --   --   --   --   INR 1.24  --   --   --   --   --   HEPARINUNFRC  --   --  <0.10*  --   --  0.37  CREATININE  --  3.51*  --  5.64*  --   --     Estimated Creatinine Clearance: 16.9 ml/min (by C-G formula based on Cr of 5.64).  Assessment: Patient is a 43 y.o F on coumadin PTA for recent RUE DVT with drug placed on hold since admission.  Heparin started yesterday while off coumadin. HIDA scan negative-- no plan for surgical interventions.  Plan to advance diet today.  Heparin level now back at goal with 0.37 after rate increased to 1650 units/hr.  Hgb down slightly to 9.1, plt ok, no bleeding documented.  Goal of Therapy:  Heparin level 0.3-0.7 units/ml Monitor platelets by anticoagulation protocol: Yes   Plan:  1) continue heparin drip at 1650 units/hr 2) recheck another heparin level today at 4PM to confirm current rate is still appropriate for patient 3) Notify pharmacy if/when coumadin can be resumed for patient  Dia Sitter P 11/28/2013,10:32 AM  Adden:  Ok to resume coumadin per Dr. Tana Coast.  Will give coumadin 1mg  PO x1 today

## 2013-11-28 NOTE — Progress Notes (Signed)
[place] for Heparin Indication: history of RUE DVT  Allergies  Allergen Reactions  . Crestor [Rosuvastatin] Other (See Comments)    Severe muscle weakness  . Nsaids Other (See Comments)    Not allergic, "bad on my kidneys"  . Ciprofloxacin Rash    Patient Measurements: Height: 5\' 7"  (170.2 cm) Weight: 264 lb 1.6 oz (119.795 kg) IBW/kg (Calculated) : 61.6 Heparin Dosing Weight: 89 kg  Vital Signs: Temp: 98.3 F (36.8 C) (08/11 2001) Temp src: Oral (08/11 1712) BP: 120/72 mmHg (08/11 2001) Pulse Rate: 95 (08/11 2001)  Labs:  Recent Labs  11/25/13 0903 11/25/13 1038 11/26/13 1953 11/27/13 2330  HGB 9.1*  --  10.3*  --   HCT 30.3*  --  36.0  --   PLT 341  --  372  --   LABPROT  --  15.6*  --   --   INR  --  1.24  --   --   HEPARINUNFRC  --   --   --  <0.10*  CREATININE 5.30*  --  3.51*  --     Estimated Creatinine Clearance: 27.1 ml/min (by C-G formula based on Cr of 3.51).  Assessment: 43 yo female with recent DVT, Coumadin on hold, for heparin  Goal of Therapy:  Heparin level 0.3-0.7 units/ml Monitor platelets by anticoagulation protocol: Yes   Plan:  Heparin 2000 units IV bolus, then increase heparin 1650 units/hr Follow-up am labs.   Caryl Pina 11/28/2013,12:08 AM

## 2013-11-28 NOTE — Progress Notes (Signed)
Agree with above.  Her ideal treatment for symptoms as well as to prevent further issues is cholecystectomy. I have been told by Dr Aundra Dubin that she is very high risk.  Both of [name] think her chance of major cardiac complication is greater than issues with gb given her history, lack of ability to improve cardiac function due to inabilty to revascularize and inability to give appropriate meds (due to bp on dialysis).  She understands she may end up with infection, pancreatitis or choledocholithiasis and these may be life threatening for her. For now we will manage with ursodiol, pain control, antimemetics.

## 2013-11-28 NOTE — Progress Notes (Signed)
[facility] Progress Note  Assessment/Plan: 1. Abdominal Pain/Nausea/Vomiting - still with RUQ abdominal pain; no solids yet;  CT abd/pelvis with free air around urinary bladder concerning for infection vs microperforation from diverticulosis. Afebrile. Normal LFTs. Surgery has seen with recs for abx; HIDA scan normal On Zosyn 2. ESRD - MWF, K 4.3 - ^ volume - needs edw lowered; challenging gently 3. Hypotension/volume - SBPs 100s-110s on chronic midodrine TID. Tapering edw down outpatient. Net UF Monday 2.9 - ^ goal today to 4 L 4. Anemia - Hgb 9.1stable. On Aranesp 150 mcg/ last dose 8/5. Fe load in progress through 8/21 5. Metabolic bone disease - Ca 9.4 (10.5 corrected). Phos poorly controlled outpt on Phoslo 2 ac. Holding vit D for hypercalcemia. Low Ca bath. Try Fosrenol P still increased some on fosrenol with CL diet (which has min P)- no change for now 6. Nutrition - CL - add resource needs protein for wound healing - adv diet as able 7. RLE wounds/Cellulitis - S/p thromboembolectomy, fasciotomy and several debridements for ischemic leg, [facility] has been following. Just finished course of clindamycin. Wound care per [facility] three times a week wet to dry dressings bid - has sig LE edema - needs volume challanged to help with this 8. Carotid stenosis - [facility] following outpatient. (Rt 60-79%, Lt 80-99%).  9. Permanent HD access - AVF planned once carotids addressed per VVS 10. Rt Axillary DVT - On heparin here  11. CAD - 3V disease/ no targets for revasc per cath 08/2013 12. Chronic systolic-diastolic CHF/ ICCM - EF 24-23% per echo 10/2013 13. DM - insulin per primary       14. [facility] following multiple issues - following  [name], PA-C [place] 11/28/2013,7:57 AM  LOS: 3 days   Pt seen, examined and agree w A/P as above. Still reports abd pain, not toxic in appearance. HD today.  [name] MD pager [phone]    cell [phone] 11/28/2013,  2:08 PM    Subjective:   Still with abdominal pain  Objective Filed Vitals:   11/28/13 0432 11/28/13 0652 11/28/13 0657 11/28/13 0730  BP: 132/73 110/74 124/82 117/80  Pulse: 90 88 86 86  Temp: 98 F (36.7 C) 98 F (36.7 C)    TempSrc:  Oral    Resp: 18 18    Height:      Weight:  120 kg (264 lb 8.8 oz)    SpO2: 97% 99%     Physical Exam General: NAD but feels bad dozing ono HD Heart: RRR Lungs: no wheezes/rales Abdomen: soft RUQ tenderness and left lower near wound Extremities: ++R>L edema; with erythema around dressings Dialysis Access: left I-J  Dialysis Orders: Center: [facility] on MWF.  EDW 118.5 HD Bath 2/2.25 Time 4 Heparin 5000 Access Lt I-J TDC (plan perm access when other issues resolved)  Hectorol 4 mcg IV/HD Aranesp 150 dosed 8/5 Units IV/HD Venofer load through 8/21  Recent labs: Hgb 8.7 (slowly improving from 7.9 on 7/15) Tsat 19%, P 9.8, PTH 692   Additional Objective Labs: Basic Metabolic Panel:  Recent Labs Lab 11/25/13 0903 11/26/13 1953 11/28/13 0658  NA 139 138 135*  K 5.1 4.1 4.3  CL 96 93* 93*  CO2 28 24 27   GLUCOSE 152* 103* 113*  BUN 40* 23 36*  CREATININE 5.30* 3.51* 5.64*  CALCIUM 9.4  9.5 8.3* 8.3*  PHOS 6.0*  --  6.2*   Liver Function Tests:  Recent Labs Lab 11/25/13  7564 11/28/13 0658  AST 24  --   ALT 19  --   ALKPHOS 100  --   BILITOT 0.5  --   PROT 6.1  --   ALBUMIN 2.6* 2.3*    Recent Labs Lab 11/25/13 0903  LIPASE 32   CBC:  Recent Labs Lab 11/25/13 0903 11/26/13 1953 11/28/13 0710  WBC 6.2 5.1 4.7  NEUTROABS 4.3  --   --   HGB 9.1* 10.3* 9.1*  HCT 30.3* 36.0 30.2*  MCV 91.5 92.1 90.7  PLT 341 372 317   Blood Culture    Component Value Date/Time   SDES BLOOD RIGHT FOREARM 11/06/2013 1130   SPECREQUEST BOTTLES DRAWN AEROBIC AND ANAEROBIC 4CC 11/06/2013 1130   CULT  Value: NO GROWTH 5 DAYS Performed at [facility] 11/06/2013 1130   REPTSTATUS 11/12/2013 FINAL 11/06/2013 1130   CBG:  Recent  Labs Lab 11/27/13 1144 11/27/13 1709 11/27/13 1959 11/27/13 2354 11/28/13 0402  GLUCAP 95 176* 253* 193* 144*  Studies/Results: Nm Hepatobiliary  11/27/2013   CLINICAL DATA:  Evaluate for cholecystitis. Right upper quadrant pain, nausea and vomiting 5 days.  EXAM: NUCLEAR MEDICINE HEPATOBILIARY IMAGING  TECHNIQUE: Sequential images of the abdomen were obtained out to 60 minutes following intravenous administration of radiopharmaceutical.  RADIOPHARMACEUTICALS:  5.0 Millicurie [id number]-[id number] Choletec  COMPARISON:  Ultrasound 11/26/2013 and CT 11/25/2013  FINDINGS: Examination demonstrates normal uniform uptake of radiotracer by the liver. There is normal gallbladder filling and biliary to bowel transit of radiotracer at 20 min.  IMPRESSION: Normal hepatobiliary scan without evidence of cholecystitis.   Electronically Signed   By: Marin Olp M.D.   On: 11/27/2013 11:19   US Abdomen Complete  11/26/2013   CLINICAL DATA:  Evaluate for cholecystitis, right upper quadrant pain  EXAM: ULTRASOUND ABDOMEN COMPLETE  COMPARISON:  None.  FINDINGS: Gallbladder:  There are cholelithiasis. The gallbladder is distended. There is mild gallbladder wall thickening measuring 3.4 mm. No sonographic Murphy sign noted.  Common bile duct:  Diameter: 2.7 mm  Liver:  No focal lesion identified. The liver is enlarged. The echotexture is heterogeneous.  IVC:  No abnormality visualized.  Pancreas:  Visualized portion unremarkable.  Spleen:  Size and appearance within normal limits.  Right Kidney:  Length: 10.6 cm. Increased renal cortical echogenicity. No mass or hydronephrosis visualized.  Left Kidney:  Length: 11.9 cm. Increased renal cortical echogenicity. There is a 6.4 x 5.3 x 4.8 cm lobulated anechoic left renal mass with increased through transmission most consistent with a cyst. No hydronephrosis visualized.  Abdominal aorta:  No aneurysm visualized.  Other findings:  Bilateral pleural effusions.  IMPRESSION: 1. Cholelithiasis  with mild gallbladder distension and mild gallbladder wall thickening concerning for cholecystitis. False-positive gallbladder wall thickening can be seen with hepatocellular disease and fluid overload states. 2. Hepatomegaly. 3. Bilateral pleural effusions.  4. Increased renal cortical echogenicity as can be seen with medical renal disease or AKI.   Electronically Signed   By: Kathreen Devoid   On: 11/26/2013 13:52   Medications: . heparin 1,650 Units/hr (11/28/13 0722)   . aspirin EC  81 mg Oral Daily  . atorvastatin  80 mg Oral q1800  . darbepoetin (ARANESP) injection - DIALYSIS  150 mcg Intravenous Q Wed-HD  . feeding supplement (RESOURCE BREEZE)  1 Container Oral TID BM  . ferric gluconate (FERRLECIT/NULECIT) IV  125 mg Intravenous Q M,W,F-HD  . insulin aspart  0-9 Units Subcutaneous 6 times per day  . insulin detemir  10  Units Subcutaneous QHS  . lanthanum  1,000 mg Oral TID WC  . midodrine  10 mg Oral TID WC  . multivitamin  1 tablet Oral QHS  . pantoprazole  40 mg Oral Q1200  . piperacillin-tazobactam (ZOSYN)  IV  2.25 g Intravenous 3 times per day  . sodium chloride  3 mL Intravenous Q12H

## 2013-11-29 ENCOUNTER — Ambulatory Visit: Payer: Medicare Other | Admitting: Family Medicine

## 2013-11-29 LAB — GLUCOSE, CAPILLARY
GLUCOSE-CAPILLARY: 276 mg/dL — AB (ref 70–99)
Glucose-Capillary: 164 mg/dL — ABNORMAL HIGH (ref 70–99)
Glucose-Capillary: 90 mg/dL (ref 70–99)
Glucose-Capillary: 197 mg/dL — ABNORMAL HIGH (ref 70–99)
Glucose-Capillary: 328 mg/dL — ABNORMAL HIGH (ref 70–99)

## 2013-11-29 LAB — PROTIME-INR
INR: 1.87 — ABNORMAL HIGH (ref 0.00–1.49)
PROTHROMBIN TIME: 21.5 s — AB (ref 11.6–15.2)

## 2013-11-29 MED ORDER — URSODIOL 300 MG PO CAPS: 300.0000 mg | ORAL_CAPSULE | Freq: Two times a day (BID) | ORAL | Status: AC

## 2013-11-29 MED ORDER — ONDANSETRON HCL 4 MG PO TABS: 4.0000 mg | ORAL_TABLET | Freq: Three times a day (TID) | ORAL | Status: DC | PRN

## 2013-11-29 MED ORDER — HYDROCODONE-ACETAMINOPHEN 5-325 MG PO TABS: 1.0000 | ORAL_TABLET | Freq: Four times a day (QID) | ORAL | Status: DC | PRN

## 2013-11-29 MED ORDER — WARFARIN 0.5 MG HALF TABLET
0.5000 mg | ORAL_TABLET | Freq: Once | ORAL | Status: DC
  Filled 2013-11-29: qty 1

## 2013-11-29 MED ORDER — TRAMADOL HCL 50 MG PO TABS: 50.0000 mg | ORAL_TABLET | Freq: Four times a day (QID) | ORAL | Status: DC | PRN

## 2013-11-29 NOTE — Progress Notes (Signed)
Inpatient Diabetes Program Recommendations  AACE/ADA: New Consensus Statement on Inpatient Glycemic Control (2013)  Target Ranges:  Prepandial:   less than 140 mg/dL      Peak postprandial:   less than 180 mg/dL (1-2 hours)      Critically ill patients:  140 - 180 mg/dL  Results for Hannah [name], Hannah [name] (MRN [id number]) as of 11/29/2013 12:30  Ref. Range 11/28/2013 20:02 11/29/2013 00:08 11/29/2013 04:20 11/29/2013 07:48 11/29/2013 11:21  Glucose-Capillary Latest Range: 70-99 mg/dL 229 (H) 276 (H) 164 (H) 90 197 (H)   Reason for Assessment: elevated BG   Diabetes history: Type 2 Outpatient Diabetes medications: Levemir 20 units Qpm, Humalog 5units tid Current orders for Inpatient glycemic control: Levemir 10 units QHS + Novolog Sensitive SSI Q4 hours.  If this patient is not discharged as anticipated, please consider add mealtime insulin, Novolog 3 units tid with meals and change CBG order to TID ACmeals.  Gentry Fitz, RN, BA, MHA, CDE Diabetes Coordinator Inpatient Diabetes Program  [phone] (Team Pager) [phone] [facility]) 11/29/2013 12:32 PM

## 2013-11-29 NOTE — ED Provider Notes (Signed)
Medical screening examination/treatment/procedure(s) were conducted as a shared visit with non-physician practitioner(s) and myself.  I personally evaluated the patient during the encounter.   EKG Interpretation None     See other note.   Virgel Manifold, MD 11/29/13 [phone]

## 2013-11-29 NOTE — Care Management Note (Signed)
CARE MANAGEMENT NOTE 11/29/2013  Patient:  Hannah [name], Hannah [name]   Account Number:  [id number]  Date Initiated:  11/29/2013  Documentation initiated by:  Arma Reining  Subjective/Objective Assessment:   CM following for progression and d/c planning     Action/Plan:   Met with pt , will resume [facility] HH services per pt request. IM given and pt voiced understanding of option.   Anticipated DC Date:  11/29/2013   Anticipated DC Plan:  [place]         Choice offered to / List presented to:          [facility] arranged  HH-1 RN  [place].   Status of service:  Completed, signed off Medicare Important Message given?  YES (If response is "NO", the following Medicare IM given date fields will be blank) Date Medicare IM given:  11/29/2013 Medicare IM given by:  Maryruth Apple Date Additional Medicare IM given:   Additional Medicare IM given by:    Discharge Disposition:  [place]  Per UR Regulation:    If discussed at Long Length of Stay Meetings, dates discussed:    Comments:

## 2013-11-29 NOTE — Progress Notes (Signed)
[name] Progress Note  Assessment/Plan: 1. Abdominal Pain/Nausea/Vomiting - still with RUQ abdominal pain goes around her side but feels MUCH better than when she came in. ; tolerated breakfast today ; CT abd/pelvis with free air around urinary bladder concerning for infection vs microperforation from diverticulosis. Afebrile. Normal LFTs. Surgery has seen with recs for abx; HIDA scan normal On Zosyn -  2. ESRD - MWF, K 4.3 - ^ volume - needs edw lowered; challenging gently - continue to lower after d/c due to sign LE edema; HD orders written in case she is not d/c today 3. Hypotension/volume - SBPs 100s-110s on chronic midodrine TID. Tapering edw down outpatient. Net UF Monday 2.9 - net UF 3.4 on Wed to post weight of 116.7 (prior EDW 118.5) 4. Anemia - Hgb 9.1stable. On Aranesp 150 mcg/ last dose 8/11. Fe load in progress through 8/21 5. Metabolic bone disease - Ca 9.4 (10.5 corrected). Phos poorly controlled outpt on Phoslo 2 ac. Holding vit D for hypercalcemia. Low Ca bath. Try Fosrenol P still increased some on fosrenol with CL diet (which has min P)- no change for now 6. Nutrition - CL - add resource needs protein for wound healing - adv diet as able 7. RLE wounds/Cellulitis - S/p thromboembolectomy, fasciotomy and several debridements for ischemic leg, VVS has been following. Just finished course of clindamycin. Wound care per [facility] three times a week wet to dry dressings bid - has sig LE edema - needs volume challanged to help with this 8. Carotid stenosis - [facility]VS following outpatient. (Rt 60-79%, Lt 80-99%).  9. Permanent HD access - AVF planned once carotids addressed per VVS 10. Rt Axillary DVT - on coumadin  11. CAD - 3V disease/ no targets for revasc per cath 08/2013 12. Chronic systolic-diastolic CHF/ ICCM - EF 71-06% per echo 10/2013 13. DM - insulin per primary       14. VVS following multiple issues - following  [name], PA-C [place] [phone] 11/29/2013,8:41 AM  LOS: 4 days   Pt seen, examined and agree w A/P as above.  [name] MD pager [phone]    cell [phone] 11/29/2013, 2:22 PM    Subjective:   Feeling better overall.  No nausea with breakfast  Objective Filed Vitals:   11/28/13 1146 11/28/13 2151 11/28/13 2254 11/29/13 0603  BP: 101/80 99/76  94/74  Pulse: 98 97  98  Temp: 97.7 F (36.5 C) 98.1 F (36.7 C)  97.4 F (36.3 C)  TempSrc: Oral Oral  Oral  Resp: 18 18  18   Height:      Weight:   120.3 kg (265 lb 3.4 oz)   SpO2: 100% 95%  95%   Physical Exam General: NAD sitting on the side of the bed up working puzzles in newspaper  Heart: tachy reg ~100 - 110 Lungs: no rales Abdomen: obese no RUQ tenderness Extremities: +++ R>L edema with erythema on right leg Dialysis Access: left I-J  Dialysis Orders: Center: [facility] on MWF.  EDW 118.5 HD Bath 2/2.25 Time 4 Heparin 5000 Access Lt I-J TDC (plan perm access when other issues resolved)  Hectorol 4 mcg IV/HD Aranesp 150 dosed 8/5 Units IV/HD Venofer load through 8/21  Recent labs: Hgb 8.7 (slowly improving from 7.9 on 7/15) Tsat 19%, P 9.8, PTH 692   Additional Objective Labs: Basic Metabolic Panel:  Recent Labs Lab 11/25/13 0903 11/26/13 1953 11/28/13 0658  NA 139 138 135*  K 5.1 4.1 4.3  CL 96 93* 93*  CO2 28 24 27   GLUCOSE 152* 103* 113*  BUN 40* 23 36*  CREATININE 5.30* 3.51* 5.64*  CALCIUM 9.4  9.5 8.3* 8.3*  PHOS 6.0*  --  6.2*   Lab Results  Component Value Date   INR 1.87* 11/29/2013   INR 2.44* 11/28/2013   INR 2.49* 11/28/2013    Liver Function Tests:  Recent Labs Lab 11/25/13 0903 11/28/13 0658  AST 24  --   ALT 19  --   ALKPHOS 100  --   BILITOT 0.5  --   PROT 6.1  --   ALBUMIN 2.6* 2.3*    Recent Labs Lab 11/25/13 0903  LIPASE 32   CBC:  Recent Labs Lab 11/25/13 0903 11/26/13 1953 11/28/13 0710  WBC 6.2 5.1 4.7  NEUTROABS 4.3  --   --   HGB 9.1* 10.3* 9.1*  HCT 30.3* 36.0  30.2*  MCV 91.5 92.1 90.7  PLT 341 372 317   Blood Culture    Component Value Date/Time   SDES URINE, RANDOM 11/27/2013 0206   SPECREQUEST NONE 11/27/2013 0206   CULT  Value: Multiple bacterial morphotypes present, none predominant. Suggest appropriate recollection if clinically indicated. Performed at [facility] 11/27/2013 0206   REPTSTATUS 11/28/2013 FINAL 11/27/2013 0206   CBG:  Recent Labs Lab 11/28/13 1702 11/28/13 2002 11/29/13 0008 11/29/13 0420 11/29/13 0748  GLUCAP 221* 229* 276* 164* 90  Studies/Results: Nm Hepatobiliary  11/27/2013   CLINICAL DATA:  Evaluate for cholecystitis. Right upper quadrant pain, nausea and vomiting 5 days.  EXAM: NUCLEAR MEDICINE HEPATOBILIARY IMAGING  TECHNIQUE: Sequential images of the abdomen were obtained out to 60 minutes following intravenous administration of radiopharmaceutical.  RADIOPHARMACEUTICALS:  5.0 [employer] ZS-01U Choletec  COMPARISON:  Ultrasound 11/26/2013 and CT 11/25/2013  FINDINGS: Examination demonstrates normal uniform uptake of radiotracer by the liver. There is normal gallbladder filling and biliary to bowel transit of radiotracer at 20 min.  IMPRESSION: Normal hepatobiliary scan without evidence of cholecystitis.   Electronically Signed   By: Marin Olp M.D.   On: 11/27/2013 11:19   Medications:   . aspirin EC  81 mg Oral Daily  . atorvastatin  80 mg Oral q1800  . darbepoetin (ARANESP) injection - DIALYSIS  150 mcg Intravenous Q Wed-HD  . feeding supplement (RESOURCE BREEZE)  1 Container Oral TID BM  . ferric gluconate (FERRLECIT/NULECIT) IV  125 mg Intravenous Q M,W,F-HD  . insulin aspart  0-9 Units Subcutaneous 6 times per day  . insulin detemir  10 Units Subcutaneous QHS  . lanthanum  1,000 mg Oral TID WC  . midodrine  10 mg Oral TID WC  . multivitamin  1 tablet Oral QHS  . pantoprazole  40 mg Oral Q1200  . sodium chloride  3 mL Intravenous Q12H  . ursodiol  300 mg Oral Daily  . Warfarin - Pharmacist  Dosing Inpatient   Does not apply [phone]

## 2013-11-29 NOTE — Discharge Summary (Signed)
Physician Discharge Summary  Patient ID: Hannah [name] MRN: [id number] DOB/AGE: March 14, 1971 43 y.o.  Admit date: 11/25/2013 Discharge date: 11/29/2013  Primary Care Physician:  Vic Blackbird, MD  Discharge Diagnoses:     Acute abdominal pain, nausea and vomiting secondary to acute biliary colic  . Diabetic retinopathy associated with type 2 diabetes mellitus . Hyperlipidemia . GERD (gastroesophageal reflux disease) . Chronic combined systolic and diastolic CHF, NYHA class 3 . Type II or unspecified type diabetes mellitus with neurological manifestations, not stated as uncontrolled . Adenocarcinoma of lung . Anemia of chronic disease . CAD- severe 3V CAD May 2015- turned down for CABG . Ventricular tachycardia . Cardiomyopathy, ischemic- EF 25-30% July 2015  Consults: General surgery, Dr. Donne Hazel                    Cardiology, Dr. Acie Fredrickson                     Nephrology     Recommendations for Outpatient Follow-up:  Per general surgery recommendations, managed medically with ursodiol, pain control, antimemetics. She is high risk for cholecystectomy. Should she developed acute cholecystitis a cholecystostomy tube would be the treatment.     Allergies:   Allergies  Allergen Reactions  . Crestor [Rosuvastatin] Other (See Comments)    Severe muscle weakness  . Nsaids Other (See Comments)    Not allergic, "bad on my kidneys"  . Ciprofloxacin Rash     Discharge Medications:   Medication List         acetaminophen 500 MG tablet  Commonly known as:  TYLENOL  Take 1,000 mg by mouth every 8 (eight) hours as needed for mild pain.     aspirin 81 MG EC tablet  Take 1 tablet (81 mg total) by mouth daily.     atorvastatin 80 MG tablet  Commonly known as:  LIPITOR  Take 1 tablet (80 mg total) by mouth daily at 6 PM.     calcium acetate 667 MG capsule  Commonly known as:  PHOSLO  Take 2 capsules (1,334 mg total) by mouth 3 (three) times daily with meals.      cyclobenzaprine 10 MG tablet  Commonly known as:  FLEXERIL  Take 10 mg by mouth 3 (three) times daily as needed for muscle spasms.     HYDROcodone-acetaminophen 5-325 MG per tablet  Commonly known as:  NORCO  Take 1 tablet by mouth every 6 (six) hours as needed for moderate pain or severe pain.     Insulin Detemir 100 UNIT/ML Pen  Commonly known as:  LEVEMIR FLEXPEN  Inject 20 Units into the skin every evening.     insulin lispro 100 UNIT/ML KiwkPen  Commonly known as:  HUMALOG KWIKPEN  Inject 0.05 mLs (5 Units total) into the skin 3 (three) times daily with meals.     methylcellulose 1 % ophthalmic solution  Commonly known as:  ARTIFICIAL TEARS  Place 1 drop into both eyes at bedtime as needed (dry eyes).     midodrine 10 MG tablet  Commonly known as:  PROAMATINE  Take 1 tablet (10 mg total) by mouth 3 (three) times daily with meals.     multivitamin Tabs tablet  Take 1 tablet by mouth daily.     ondansetron 4 MG tablet  Commonly known as:  ZOFRAN  Take 1 tablet (4 mg total) by mouth every 8 (eight) hours as needed for nausea or vomiting.     pantoprazole 40  MG tablet  Commonly known as:  PROTONIX  Take 1 tablet (40 mg total) by mouth daily.     polyethylene glycol packet  Commonly known as:  MIRALAX / GLYCOLAX  Take 17 g by mouth daily as needed (constipation).     traMADol 50 MG tablet  Commonly known as:  ULTRAM  Take 1 tablet (50 mg total) by mouth every 6 (six) hours as needed for moderate pain.     ursodiol 300 MG capsule  Commonly known as:  ACTIGALL  Take 1 capsule (300 mg total) by mouth 2 (two) times daily.     warfarin 1 MG tablet  Commonly known as:  COUMADIN  Take 1 mg by mouth See admin instructions. Take 1mg  on Friday and Saturday. Sunday take 0.5mg .         Brief H and P: For complete details please refer to admission H and P, but in brief Hannah [name] is a 43 y.o. female with significant past medical history including (but not limited to)  HTN (with currently hypotension and need for midodrine), ESRD (HD M-W-F), HLD, CAD, chronic combined heart failure (last EF 25-30%), diabetes with neuropathy, GERD, cardiogenic shock (with IABP placement) and recent admission due to RLE cellulitis and supratherapeutic INR; came to Ed complaining of abdominal pain, nausea adn vomiting for the last 48 hours. Patient reported symptoms started on Friday night after dinner and course has continue worsening over the weekend. Patient reported associated fever (Temp max 100 at home; and afebrile in the ED) and anorexia. Patient denied CP, SOB, hematemesis, melena, constipation, diarrhea, dysuria or any other complaints.  Work up showed a normal white count, normal LFTs. VSS. A non contrast CT of abdomen and pelvis showed cholelithiasis, diverticulosis (without diverticulitis), normal appearing appendix and air surrounding the bladder.  Hospital Course:  43 year old female, new to hemodialysis as of May 2015, complex PMH for lung cancer s/p LLL lobectomy, chronic combined CHF (EF 25-30%), HTN , HLD, CAD, DM 2 with neuropathy, GERD, prolonged hospitalization 5/18-7/13 for PEA arrest, shock, right heart cath, carotid stenosis and right foot ischemia secondary to IABP placement which ultimately required a right femoral endarterectomy/thrombectomy/faciotomy and debridement. She also had recent admission 7/21-7/25 for RLE cellulitis. She presented on 11/25/13 with complaints of abdominal pain, nausea and vomiting that started after she had consumed some seafood.  Work up showed a normal white count, normal LFTs. CT of abdomen and pelvis showed cholelithiasis, diverticulosis (without diverticulitis), normal appearing appendix and air surrounding the bladder.  Acute biliary colic  Abdominal pain has been improving. General surgery was consulted. Abdominal ultrasound positive for gallstones, thickened gallbladder wall. HIDA scan was however negative for acute cholecystitis.  Per general surgery, patient is high risk for surgery/cholecystectomy. Cardiology was also consulted who also felt that her major cardiac complications are greater than issues with gallbladder, hence surgery was not pursued. Patient was recommended low-fat diet, pain control, ursodiol, antiemetics. Antibiotics were discontinued. Per surgery, should she developed acute cholecystitis a cholecystostomy tube would be the treatment. Abdominal pain has improved, patient is tolerating solid diet.   UTI  - Urine culture showed only 60,000 colonies, discontinued Zosyn   ESRD  - Nephrology following, on HD MWF   History of Adenocarcinoma of lung  - Status post LLL lobectomy in 2013   Diabetic retinopathy associated with type 2 diabetes mellitus  - Continue Levemir, controlled   CAD- severe 3V CAD May 2015- turned down for CABG  - Volume management with hemodialysis.  Cardiology input appreciated. Three-vessel disease/no targets for revascularization per Cath 5/15. LVEF 25-30% per echo 10/2013   GERD (gastroesophageal reflux disease):  - Continue PPI   History of recent right axillary DVT:  - Coumadin held and on heparin -DVT prophylactic dose. She apparently developed the RUE DVT 6/4 at site of her triple-lumen catheter. She also repeatedly had clotted off hemodialysis catheter. 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 She was switched to Argatroban and eventually converted to Coumadin with resolution of her prothrombotic state. Subsequent HIT panel showed negative screen and negative SRA and heparin taken off allergy list  - No plans of surgery, restarted Coumadin-duration of anticoagulation 3 months post cath removal   History of right lower extremity wound/cellulitis: Status post thromboembolectomy, faciotomy and several debridements. Vascular has been following. Just finished course of clindamycin. Wound care recommended. Hemodialysis for volume  management and edema.   Day of Discharge BP 94/74  Pulse 98  Temp(Src) 97.4 F (36.3 C) (Oral)  Resp 18  Ht 5\' 7"  (1.702 m)  Wt 120.3 kg (265 lb 3.4 oz)  BMI 41.53 kg/m2  SpO2 95%  LMP 09/18/2013  Physical Exam:  General: Alert and awake, oriented x3, not in any acute distress.  CVS: S1-S2 clear, no murmur rubs or gallops  Chest: clear to auscultation bilaterally, no wheezing, rales or rhonchi  Abdomen: Obese, soft right upper quadrant tenderness , normal bowel sounds  Extremities: b/l edema, R>L. Significant right lower extremity erythema    The results of significant diagnostics from this hospitalization (including imaging, microbiology, ancillary and laboratory) are listed below for reference.    LAB RESULTS: Basic Metabolic Panel:  Recent Labs Lab 11/25/13 0903 11/26/13 1953 11/28/13 0658  NA 139 138 135*  K 5.1 4.1 4.3  CL 96 93* 93*  CO2 28 24 27   GLUCOSE 152* 103* 113*  BUN 40* 23 36*  CREATININE 5.30* 3.51* 5.64*  CALCIUM 9.4  9.5 8.3* 8.3*  MG 2.3  --   --   PHOS 6.0*  --  6.2*   Liver Function Tests:  Recent Labs Lab 11/25/13 0903 11/28/13 0658  AST 24  --   ALT 19  --   ALKPHOS 100  --   BILITOT 0.5  --   PROT 6.1  --   ALBUMIN 2.6* 2.3*    Recent Labs Lab 11/25/13 0903  LIPASE 32   No results found for this basename: AMMONIA,  in the last 168 hours CBC:  Recent Labs Lab 11/25/13 0903 11/26/13 1953 11/28/13 0710  WBC 6.2 5.1 4.7  NEUTROABS 4.3  --   --   HGB 9.1* 10.3* 9.1*  HCT 30.3* 36.0 30.2*  MCV 91.5 92.1 90.7  PLT 341 372 317   Cardiac Enzymes: No results found for this basename: CKTOTAL, CKMB, CKMBINDEX, TROPONINI,  in the last 168 hours BNP: No components found with this basename: POCBNP,  CBG:  Recent Labs Lab 11/29/13 0748 11/29/13 1121  GLUCAP 90 197*    Significant Diagnostic Studies:  Ct Abdomen Pelvis Wo Contrast  11/25/2013   CLINICAL DATA:  Diffuse abdominal pain. Nausea, vomiting since Friday.  Dialysis patient. History of CHF, CAD, GERD, hypertension, diabetes. History of lung cancer, chronic kidney disease, gastritis. Previous tubal ligation and lobectomy. Recent cardiac catheterization.  EXAM: CT ABDOMEN AND PELVIS WITHOUT CONTRAST  TECHNIQUE: Multidetector CT imaging of the abdomen and pelvis was performed following the standard protocol without IV contrast.  COMPARISON:  Ultrasound the abdomen 12/05/2012 and 06/01/2011 and PET-CT on 07/28/2011  FINDINGS: Lower chest: There bilateral pleural effusions. Heart is mildly enlarged. There are coronary artery calcifications. Trace pericardial effusion is present.  Upper abdomen: No focal abnormality identified within the liver, is clean spleen, pancreas, or adrenal glands. A left renal cyst is 5.6 cm. There are calcified dependent gallstones within the gallbladder. No other evidence for acute cholecystitis.  Bowel: The stomach and small bowel loops have a normal appearance. The appendix is well seen and has a normal appearance. There are numerous colonic diverticula but no evidence for acute diverticulitis.  Pelvis: There are small locules of gas surrounding the urinary bladder. The may be a small amount of gas within the dome of the bladder. Question of recent catheterization or other bladder procedure. No pelvic abscess. The uterus is present. No adnexal mass.Small amount of free fluid.  Abdominal wall: There is soft tissue defect in the right lower quadrant consistent with recent surgery. There is diffuse body wall edema. Small right inguinal lymph nodes are noted. These may be reactive.  Osseous structures: Unremarkable.  IMPRESSION: 1. Small amount of gas surrounding the urinary bladder, suspicious for infection or perforation of the urinary bladder. Question of any recent bladder procedures. 2. Soft tissue defect in the right lower quadrant of the abdomen consistent with recent surgery. 3. Body wall edema. 4. Cardiomegaly. 5. Coronary vascular  calcifications. 6. Trace pericardial effusion. 7. Cholelithiasis. 8. Left renal cyst. The findings were discussed with Virgie Dad on 11/25/2013 at 12:16 pm.   Electronically Signed   By: Shon Hale M.D.   On: 11/25/2013 12:18   US Abdomen Complete  11/26/2013   CLINICAL DATA:  Evaluate for cholecystitis, right upper quadrant pain  EXAM: ULTRASOUND ABDOMEN COMPLETE  COMPARISON:  None.  FINDINGS: Gallbladder:  There are cholelithiasis. The gallbladder is distended. There is mild gallbladder wall thickening measuring 3.4 mm. No sonographic Murphy sign noted.  Common bile duct:  Diameter: 2.7 mm  Liver:  No focal lesion identified. The liver is enlarged. The echotexture is heterogeneous.  IVC:  No abnormality visualized.  Pancreas:  Visualized portion unremarkable.  Spleen:  Size and appearance within normal limits.  Right Kidney:  Length: 10.6 cm. Increased renal cortical echogenicity. No mass or hydronephrosis visualized.  Left Kidney:  Length: 11.9 cm. Increased renal cortical echogenicity. There is a 6.4 x 5.3 x 4.8 cm lobulated anechoic left renal mass with increased through transmission most consistent with a cyst. No hydronephrosis visualized.  Abdominal aorta:  No aneurysm visualized.  Other findings:  Bilateral pleural effusions.  IMPRESSION: 1. Cholelithiasis with mild gallbladder distension and mild gallbladder wall thickening concerning for cholecystitis. False-positive gallbladder wall thickening can be seen with hepatocellular disease and fluid overload states. 2. Hepatomegaly. 3. Bilateral pleural effusions.  4. Increased renal cortical echogenicity as can be seen with medical renal disease or AKI.   Electronically Signed   By: Kathreen Devoid   On: 11/26/2013 13:52       Disposition and Follow-up:    DISPOSITION: Home  DIET: Carb modified, renal diet   DISCHARGE FOLLOW-UP     Follow-up Information   Follow up with Vic Blackbird, MD. Schedule an appointment as soon as possible for a  visit in 2 weeks. (for hospital follow-up)    Specialty:  Family Medicine   Contact information:   [place] [address] [phone]       Time spent on  Discharge: 40 minutes Signed:   Maude Gloor M.D. [facility] 11/29/2013, 11:32 AM Pager: [phone]   **Disclaimer: This note was dictated with voice recognition software. Similar sounding words can inadvertently be transcribed and this note may contain transcription errors which may not have been corrected upon publication of note.**

## 2013-11-29 NOTE — Progress Notes (Signed)
ANTICOAGULATION CONSULT NOTE - Follow Up Consult  Pharmacy Consult for coumadin Indication: hx RUE DVT   Allergies  Allergen Reactions  . Crestor [Rosuvastatin] Other (See Comments)    Severe muscle weakness  . Nsaids Other (See Comments)    Not allergic, "bad on my kidneys"  . Ciprofloxacin Rash    Patient Measurements: Height: 5\' 7"  (170.2 cm) Weight: 265 lb 3.4 oz (120.3 kg) IBW/kg (Calculated) : 61.6   Vital Signs: Temp: 97.4 F (36.3 C) (08/13 0603) Temp src: Oral (08/13 0603) BP: 94/74 mmHg (08/13 0603) Pulse Rate: 98 (08/13 0603)  Labs:  Recent Labs  11/26/13 1953 11/27/13 2330 11/28/13 0658 11/28/13 0710 11/28/13 0810 11/28/13 1425 11/28/13 1650 11/29/13 0626  HGB 10.3*  --   --  9.1*  --   --   --   --   HCT 36.0  --   --  30.2*  --   --   --   --   PLT 372  --   --  317  --   --   --   --   LABPROT  --   --   --   --   --  26.9* 26.5* 21.5*  INR  --   --   --   --   --  2.49* 2.44* 1.87*  HEPARINUNFRC  --  <0.10*  --   --  0.37  --  0.18*  --   CREATININE 3.51*  --  5.64*  --   --   --   --   --     Estimated Creatinine Clearance: 16.9 ml/min (by C-G formula based on Cr of 5.64).  Assessment: Patient's a 43 y.o F on coumadin PTA for recent RUE DVT.  INR on admit was 1.24 and anticoagulation was held for possible surgical interventions.  HIDA scan was negative--> no surgical interventions per [facility].  Heparin started on 8/11 since INR was subtherapeutic on 8/9 and she was off coumadin.  Coumadin resumed on 8/12 but re-checked INR on this day came back therapeutic at 2.44.  LFTs were wnl on 8/9.  Elevated INR is likely d/t residual effect of dose taken PTA and from her NPO status.  She received 1mg  yesterday with INR down from 2.44 to 1.87 this morning.   Home dose: 0.5mg  on Wed, Thurs, and Sun; 1mg  on Fri and Sat; no warf on Mon and Tues-- per warf clinic  Goal of Therapy:  INR 2-3    Plan:  1) coumadin 0.5mg  PO x1 today  2) recommend to resume home  coumadin regimen if to discharge patient today  Lynelle Doctor 11/29/2013,9:32 AM

## 2013-11-29 NOTE — Progress Notes (Signed)
Patient to be discharged to home. PIV removed. Discharge instructions reviewed with patient. Prescriptions given to patient. All belongings returned.   Joellen Jersey, RN.

## 2013-12-03 ENCOUNTER — Ambulatory Visit (INDEPENDENT_AMBULATORY_CARE_PROVIDER_SITE_OTHER): Payer: Medicare Other | Admitting: *Deleted

## 2013-12-03 DIAGNOSIS — I82A11 Acute embolism and thrombosis of right axillary vein: Secondary | ICD-10-CM

## 2013-12-03 DIAGNOSIS — I82A19 Acute embolism and thrombosis of unspecified axillary vein: Secondary | ICD-10-CM

## 2013-12-03 DIAGNOSIS — Z5181 Encounter for therapeutic drug level monitoring: Secondary | ICD-10-CM

## 2013-12-03 LAB — POCT INR: INR: 1.2

## 2013-12-06 ENCOUNTER — Telehealth: Payer: Self-pay | Admitting: *Deleted

## 2013-12-06 ENCOUNTER — Ambulatory Visit (INDEPENDENT_AMBULATORY_CARE_PROVIDER_SITE_OTHER): Payer: Medicare Other | Admitting: *Deleted

## 2013-12-06 DIAGNOSIS — Z5181 Encounter for therapeutic drug level monitoring: Secondary | ICD-10-CM

## 2013-12-06 DIAGNOSIS — I82A19 Acute embolism and thrombosis of unspecified axillary vein: Secondary | ICD-10-CM

## 2013-12-06 DIAGNOSIS — I82A11 Acute embolism and thrombosis of right axillary vein: Secondary | ICD-10-CM

## 2013-12-06 LAB — POCT INR: INR: 1.2

## 2013-12-06 NOTE — Telephone Encounter (Signed)
inr 1.2 pt 14.3

## 2013-12-06 NOTE — Telephone Encounter (Signed)
See coumadin note. 

## 2013-12-07 ENCOUNTER — Telehealth: Payer: Self-pay | Admitting: *Deleted

## 2013-12-07 ENCOUNTER — Telehealth: Payer: Self-pay | Admitting: Family Medicine

## 2013-12-07 MED ORDER — CLINDAMYCIN HCL 300 MG PO CAPS: 300.0000 mg | ORAL_CAPSULE | Freq: Three times a day (TID) | ORAL | Status: DC

## 2013-12-07 NOTE — Telephone Encounter (Signed)
Restart clindamycin  300mg  TID x 10 days

## 2013-12-07 NOTE — Telephone Encounter (Signed)
Received call from [place], [facility] with [place]. [phone].  Reports that patient was admitted to hospital for revascularization of lower leg. States that she has been released to home with [facility] services. Reports that patient has Hospital F/U scheduled with MD for 12/17/2013.  States that patient has 3 open wounds that [place] is treating with wet to dry dressings. The largest wound has become red around the wound edges and warm to the touch. There is also purulent green drainage noted.   Requested MD to prescribe ABTx for possible cellulitis.   MD please advise.

## 2013-12-07 NOTE — Telephone Encounter (Signed)
Prescription sent to pharmacy.   Call placed to patient. [place].   [place] nurse made aware.   Advised by MD to have cardiologist monitor PT/INR while on ABTx.   Also advised that patient needs to schedule interim visit for cellulitis and MD will continue with Hospital F/U on 12/17/2013.

## 2013-12-08 NOTE — Telephone Encounter (Signed)
Call placed to patient and patient husband Hannah [name] made aware.

## 2013-12-10 ENCOUNTER — Telehealth: Payer: Self-pay | Admitting: *Deleted

## 2013-12-10 ENCOUNTER — Ambulatory Visit (INDEPENDENT_AMBULATORY_CARE_PROVIDER_SITE_OTHER): Payer: Medicare Other | Admitting: *Deleted

## 2013-12-10 DIAGNOSIS — I82A19 Acute embolism and thrombosis of unspecified axillary vein: Secondary | ICD-10-CM

## 2013-12-10 DIAGNOSIS — Z5181 Encounter for therapeutic drug level monitoring: Secondary | ICD-10-CM

## 2013-12-10 DIAGNOSIS — I82A11 Acute embolism and thrombosis of right axillary vein: Secondary | ICD-10-CM

## 2013-12-10 LAB — POCT INR: INR: 1.5

## 2013-12-10 NOTE — Telephone Encounter (Signed)
Received call from [place], [facility] with [facility] [phone].  Reports that patient has begun taking ABTx for cellulitis, but patient voices c/o increased nausea. States that she is tking Zofran as directed, but it is ineffective.   Requested to change Zofran to Promethazine.   MD please advise.

## 2013-12-11 ENCOUNTER — Ambulatory Visit (HOSPITAL_COMMUNITY)
Admission: RE | Admit: 2013-12-11 | Discharge: 2013-12-11 | Disposition: A | Discharge status: Home / Self Care | Payer: Medicare Other | Source: Ambulatory Visit | Attending: Cardiology | Admitting: Cardiology

## 2013-12-11 VITALS — BP 132/85 | HR 107 | Wt 259.8 lb

## 2013-12-11 DIAGNOSIS — I2510 Atherosclerotic heart disease of native coronary artery without angina pectoris: Secondary | ICD-10-CM | POA: Diagnosis not present

## 2013-12-11 DIAGNOSIS — I472 Ventricular tachycardia, unspecified: Secondary | ICD-10-CM | POA: Insufficient documentation

## 2013-12-11 DIAGNOSIS — K219 Gastro-esophageal reflux disease without esophagitis: Secondary | ICD-10-CM | POA: Insufficient documentation

## 2013-12-11 DIAGNOSIS — Z7982 Long term (current) use of aspirin: Secondary | ICD-10-CM | POA: Insufficient documentation

## 2013-12-11 DIAGNOSIS — N186 End stage renal disease: Secondary | ICD-10-CM | POA: Insufficient documentation

## 2013-12-11 DIAGNOSIS — E1142 Type 2 diabetes mellitus with diabetic polyneuropathy: Secondary | ICD-10-CM | POA: Insufficient documentation

## 2013-12-11 DIAGNOSIS — I120 Hypertensive chronic kidney disease with stage 5 chronic kidney disease or end stage renal disease: Secondary | ICD-10-CM | POA: Insufficient documentation

## 2013-12-11 DIAGNOSIS — I82629 Acute embolism and thrombosis of deep veins of unspecified upper extremity: Secondary | ICD-10-CM | POA: Diagnosis not present

## 2013-12-11 DIAGNOSIS — K8020 Calculus of gallbladder without cholecystitis without obstruction: Secondary | ICD-10-CM | POA: Insufficient documentation

## 2013-12-11 DIAGNOSIS — Z992 Dependence on renal dialysis: Secondary | ICD-10-CM

## 2013-12-11 DIAGNOSIS — Z7901 Long term (current) use of anticoagulants: Secondary | ICD-10-CM | POA: Insufficient documentation

## 2013-12-11 DIAGNOSIS — Z794 Long term (current) use of insulin: Secondary | ICD-10-CM | POA: Insufficient documentation

## 2013-12-11 DIAGNOSIS — H5440 Blindness, one eye, unspecified eye: Secondary | ICD-10-CM | POA: Diagnosis not present

## 2013-12-11 DIAGNOSIS — I5022 Chronic systolic (congestive) heart failure: Secondary | ICD-10-CM | POA: Insufficient documentation

## 2013-12-11 DIAGNOSIS — E11319 Type 2 diabetes mellitus with unspecified diabetic retinopathy without macular edema: Secondary | ICD-10-CM | POA: Diagnosis not present

## 2013-12-11 DIAGNOSIS — E7800 Pure hypercholesterolemia, unspecified: Secondary | ICD-10-CM | POA: Insufficient documentation

## 2013-12-11 DIAGNOSIS — Z85118 Personal history of other malignant neoplasm of bronchus and lung: Secondary | ICD-10-CM | POA: Insufficient documentation

## 2013-12-11 DIAGNOSIS — I4729 Other ventricular tachycardia: Secondary | ICD-10-CM | POA: Diagnosis not present

## 2013-12-11 DIAGNOSIS — E1039 Type 1 diabetes mellitus with other diabetic ophthalmic complication: Secondary | ICD-10-CM | POA: Insufficient documentation

## 2013-12-11 DIAGNOSIS — I509 Heart failure, unspecified: Secondary | ICD-10-CM | POA: Insufficient documentation

## 2013-12-11 DIAGNOSIS — I255 Ischemic cardiomyopathy: Secondary | ICD-10-CM

## 2013-12-11 DIAGNOSIS — N049 Nephrotic syndrome with unspecified morphologic changes: Secondary | ICD-10-CM | POA: Insufficient documentation

## 2013-12-11 DIAGNOSIS — I7090 Unspecified atherosclerosis: Secondary | ICD-10-CM

## 2013-12-11 DIAGNOSIS — D649 Anemia, unspecified: Secondary | ICD-10-CM | POA: Insufficient documentation

## 2013-12-11 DIAGNOSIS — E1049 Type 1 diabetes mellitus with other diabetic neurological complication: Secondary | ICD-10-CM | POA: Diagnosis not present

## 2013-12-11 DIAGNOSIS — I2589 Other forms of chronic ischemic heart disease: Secondary | ICD-10-CM

## 2013-12-11 DIAGNOSIS — K810 Acute cholecystitis: Secondary | ICD-10-CM

## 2013-12-11 MED ORDER — MIDODRINE HCL 10 MG PO TABS: 5.0000 mg | ORAL_TABLET | Freq: Three times a day (TID) | ORAL | Status: DC

## 2013-12-11 MED ORDER — PROMETHAZINE HCL 25 MG PO TABS: 25.0000 mg | ORAL_TABLET | Freq: Three times a day (TID) | ORAL | Status: DC | PRN

## 2013-12-11 NOTE — Telephone Encounter (Signed)
ok 

## 2013-12-11 NOTE — Telephone Encounter (Signed)
Prescription sent to pharmacy.   Call placed to [facility] to make aware.

## 2013-12-11 NOTE — Progress Notes (Signed)
Patient ID: Hannah [name], female   DOB: 1970-05-16, 43 y.o.   MRN: [id number] PCP: Vic Blackbird  Weight Range   Baseline proBNP    HPI: [name] is a 43 y/o woman with type I diabetes complicated by nephropathy, retinopathy and severe vascular disease. In 2013 she had a cardiac cath which showed moderate CAD and a mildly decreased EF. In mid May 2015, she was in the midst of workup for AV fistula surgery. An outpatient stress test was ordered. This was done on May 18 and revealed anterior and apical ischemia with an ejection fraction of 22%. She was referred for admission due to the high-risk nature of her stress test and progressive HF symptoms with volume overload. On admission, her creatinine was 4.1 and the decision was made to proceed with cardiac catheterization despite the risk of contrast nephropathy given the high-risk nature of her stress test and the fact that she was felt to be nearing ESRD anyway.   Cardiac cath was done on 5/18 revealed 3v CAD with diabetic vasculopathy. LVEDP was 38. Echo showed an EF 30-35%. She was also found to have bilateral carotid stenosis with a 60-79% lesion on the right and a high-grade 80-99% lesion on the left. She was seen by Dr. Prescott Gum for possible CABG not felt to be vacularizable. Several days post-cath she decompensated with respiratory failure in the setting of progressive volume overload and cardiogenic shock. She was started on inotropes and moved to the ICU and a Trialysis catheter was placed for CVVHD. Almost 40 pounds of fluid was removed. Unfortunately she became progressively hypotensive and had increased pressor requirements. On 5/28 she had a co-ox of 38% and was taken to the cath lab for RHC and placement of Swan Ganz catheter which conformed cardiogenic shock and an IABP was palced. With the IABP in place she developed an ischemic right foot and the pump had to be removed urgently. VVS was consulted. She was taken to the OR on 6/1 for  femoral thromboembolectomy and 4-compartment fasciotomy of her RLE. She was started on broad spectrum abx. Both her groin and facsciotomy wounds were healing slowly so wound vacs were placed and these were followed by VVS and wound care. She had several debridements. She continued to struggle with cardiogenic shock and profound hypotension and was maintained on very high dose pressors including milrinone and levophed at doses up to almost 159mcg/hr. Milrinone was eventually changed to dobutamine. On 6/3 she had VT/VF arrest and was intubated. Over the next several weeks she remained on CVVHD and we slowly and painstakingly weaned off her inotropes as her BP tolerated. (systolics often dipping into 70s). We started midodrine and titrated to 10 tid and also florinef 0.1 to help maintain her BP. She was unable to tolerate any b-blockers or ACEs due to hypotension. She finally was able to tolerate intermittent HD without any inotropic support and was moved to stepdown and later discharged home with [facility]    She was readmitted in 7/15 with cellulitis.  She was readmitted again in 8/15 with RUQ pain.  She was found to have multiple gallstones in her gallbladder by [name] and there was concern for acute cholecystitis.  However, the HIDA scan was negative and it was decided to manage her with ursodiol as she would be very high risk for surgery.  So far, she is doing ok.  No current abdominal pain.  She is able to eat without problems.  She is tolerating hemodialysis well  without hypotension.  No chest pain.  She is getting PT at home.  She is walking with a cane around the house.  No exertional dyspnea walking in her house.  Lately, these has been concern that she is developing RLE cellulitis again, and she has been on clindamycin.  This medication has been making her nauseated.   Echo (8/15) with EF 25-30% with wall motion abnormalities, mild RV dilation with moderate-severe RV systolic dysfunction, moderate MR (suspect  infarct-related).    ROS: All systems negative except as listed in HPI, PMH and Problem List.  Past Medical History  Diagnosis Date  . High cholesterol   . Diabetic retinopathy   . Peripheral neuropathy     "tips of toes"  . Blind right eye   . CHF (congestive heart failure)   . CAD (coronary artery disease)   . GERD (gastroesophageal reflux disease)   . Hypertension   . History of lung cancer 07/2011    s/p left lower lobectomy  . Diabetes mellitus     IDDM  . Cataract of right eye   . Ulcer of toe of right foot 07/10/2012    great toe  . Breast calcification, left 06/2012  . Acute biphenotypic leukemia   . CKD (chronic kidney disease) stage 5, GFR less than 15 ml/min   . Nephrotic syndrome   . Gastritis     H/o gastritis on prior endoscopy  . Anemia   . Carotid artery disease     Current Outpatient Prescriptions  Medication Sig Dispense Refill  . acetaminophen (TYLENOL) 500 MG tablet Take 1,000 mg by mouth every 8 (eight) hours as needed for mild pain.       Marland Kitchen aspirin EC 81 MG EC tablet Take 1 tablet (81 mg total) by mouth daily.  30 tablet  0  . atorvastatin (LIPITOR) 80 MG tablet Take 1 tablet (80 mg total) by mouth daily at 6 PM.  30 tablet  6  . calcium acetate (PHOSLO) 667 MG capsule Take 2 capsules (1,334 mg total) by mouth 3 (three) times daily with meals.  90 capsule  0  . clindamycin (CLEOCIN) 300 MG capsule Take 1 capsule (300 mg total) by mouth 3 (three) times daily.  30 capsule  0  . cyclobenzaprine (FLEXERIL) 10 MG tablet Take 10 mg by mouth 3 (three) times daily as needed for muscle spasms.      Marland Kitchen HYDROcodone-acetaminophen (NORCO) 5-325 MG per tablet Take 1 tablet by mouth every 6 (six) hours as needed for moderate pain or severe pain.  30 tablet  0  . Insulin Detemir (LEVEMIR FLEXPEN) 100 UNIT/ML Pen Inject 20 Units into the skin every evening.  15 mL  11  . insulin lispro (HUMALOG KWIKPEN) 100 UNIT/ML KiwkPen Inject 0.05 mLs (5 Units total) into the skin 3  (three) times daily with meals.  15 mL  0  . methylcellulose (ARTIFICIAL TEARS) 1 % ophthalmic solution Place 1 drop into both eyes at bedtime as needed (dry eyes).      . midodrine (PROAMATINE) 10 MG tablet Take 0.5 tablets (5 mg total) by mouth 3 (three) times daily with meals. Until 12/18/13 then only before HD  90 tablet  6  . multivitamin (RENA-VIT) TABS tablet Take 1 tablet by mouth daily.      . pantoprazole (PROTONIX) 40 MG tablet Take 1 tablet (40 mg total) by mouth daily.  30 tablet  6  . polyethylene glycol (MIRALAX / GLYCOLAX) packet Take 17 g  by mouth daily as needed (constipation).  14 each  6  . promethazine (PHENERGAN) 25 MG tablet Take 1 tablet (25 mg total) by mouth every 8 (eight) hours as needed for nausea or vomiting.  20 tablet  0  . traMADol (ULTRAM) 50 MG tablet Take 1 tablet (50 mg total) by mouth every 6 (six) hours as needed for moderate pain.  60 tablet  0  . ursodiol (ACTIGALL) 300 MG capsule Take 1 capsule (300 mg total) by mouth 2 (two) times daily.  60 capsule  4  . warfarin (COUMADIN) 1 MG tablet Take 1 mg by mouth See admin instructions. Take 1mg  on Friday and Saturday. Sunday take 0.5mg .       No current facility-administered medications for this encounter.     PHYSICAL EXAM: Filed Vitals:   12/11/13 1402  BP: 132/85  Pulse: 107  Weight: 259 lb 12.8 oz (117.845 kg)  SpO2: 93%    General:  Well appearing. No resp difficulty Sitting wheelchair HEENT: normal Neck: supple. JVP flat. Carotids 2+ bilaterally; no bruits. No lymphadenopathy or thryomegaly appreciated. Cor: PMI normal. Regular rate & rhythm. No rubs, gallops or murmurs. L dialysis catheter Lungs: clear Abdomen: obese, soft, nontender, nondistended. No hepatosplenomegaly. No bruits or masses. Good bowel sounds. Extremities: no cyanosis, clubbing, rash.  1+ edema to knees bilaterally.   Skin: Mild erythema right lower leg.  Neuro: alert & orientedx3, cranial nerves grossly intact. Moves all 4  extremities w/o difficulty. Affect pleasant.  ASSESSMENT & PLAN: 1. Chronic Systolic Heart Failure:  Ischemic cardiomyopathy.  EF 25-30% with moderate to severe RV systolic dysfunction on echo in 8/15.  Volume controlled by HD.  She has been doing well without hypotension.  - Decrease midodrine to 5 mg tid x 1 week, then dose midodrine only 5 mg once on the mornings of dialysis.  - Eventually, would hope to get her on ACEI/beta blocker. 2. S/p VT arrest.   3. CAD: Severe 3 vessel disease, non-revascularizable.  No chest pain.  - Continue ASA 81 and atorvastatin.   - When she stops warfarin, I would like her to start on Plavix.  - When she completes home PT, I would like her to do cardiac rehab at [facility]. 4. ESRD: On HD Mon-Wed Fri 5. R arm DVT: Catheter-related.  Needs coumadin x 3 months total.   6. Symptomatic cholelithiasis: Seems to have resolved for the time being.  Continue ursodiol.  She would be a high risk candidate for cholecystectomy.  Loralie Champagne 12/11/2013

## 2013-12-11 NOTE — Patient Instructions (Signed)
Decrease Midodrine to 5 mg (1/2 tab) Three times a day for 1 week then only take before Dialysis  You have been referred to Cardiac Rehab at [facility], they will contact you to schedule  Your physician recommends that you schedule a follow-up appointment in: 1 month

## 2013-12-13 ENCOUNTER — Ambulatory Visit (INDEPENDENT_AMBULATORY_CARE_PROVIDER_SITE_OTHER): Payer: Medicare Other | Admitting: *Deleted

## 2013-12-13 DIAGNOSIS — I82A19 Acute embolism and thrombosis of unspecified axillary vein: Secondary | ICD-10-CM

## 2013-12-13 DIAGNOSIS — Z5181 Encounter for therapeutic drug level monitoring: Secondary | ICD-10-CM

## 2013-12-13 DIAGNOSIS — I82A11 Acute embolism and thrombosis of right axillary vein: Secondary | ICD-10-CM

## 2013-12-13 LAB — POCT INR: INR: 1.7

## 2013-12-14 ENCOUNTER — Encounter: Payer: Self-pay | Admitting: Surgery

## 2013-12-17 ENCOUNTER — Telehealth: Payer: Self-pay | Admitting: Cardiovascular Disease

## 2013-12-17 ENCOUNTER — Other Ambulatory Visit: Payer: Self-pay

## 2013-12-17 ENCOUNTER — Encounter: Payer: Self-pay | Admitting: Surgery

## 2013-12-17 ENCOUNTER — Ambulatory Visit (INDEPENDENT_AMBULATORY_CARE_PROVIDER_SITE_OTHER): Payer: Medicare Other | Admitting: Surgery

## 2013-12-17 VITALS — BP 109/47 | HR 113 | Temp 97.6°F | Ht 67.0 in | Wt 265.0 lb

## 2013-12-17 DIAGNOSIS — I2510 Atherosclerotic heart disease of native coronary artery without angina pectoris: Secondary | ICD-10-CM

## 2013-12-17 DIAGNOSIS — I739 Peripheral vascular disease, unspecified: Secondary | ICD-10-CM | POA: Insufficient documentation

## 2013-12-17 LAB — POCT INR: INR: 2

## 2013-12-17 NOTE — Telephone Encounter (Signed)
Spoke with [facility] Gaud[facility] nurse and she states she will call vascular surgeon to see how long she needs to hold coumadin as she is scheduled for right arm AV Fistula creation  on Sept 10th

## 2013-12-17 NOTE — Telephone Encounter (Signed)
INR - 2.0 and PT - 23.7 / please call with instructions / tgs

## 2013-12-17 NOTE — Telephone Encounter (Signed)
Spoke with Kathlee Nations and she has not heard from MD doing procedure  so instructed to have pt continue on coumadin 2mg  daily and Kathlee Nations states she will call her tonight and inform her and we will check further with her MD tomorrow

## 2013-12-17 NOTE — Progress Notes (Signed)
Patient name: Hannah [name] MRN: [id number] DOB: Apr 23, 1970 Sex: female     Chief Complaint  Patient presents with  . Re-evaluation    3 wk f/u - s/p Rt fem endarterectomy/thrombecomy    HISTORY OF PRESENT ILLNESS: The patient is here today for followup.  She is known to me initially for renal failure and carotid stenosis.  She was scheduled to undergo cardiac clearance with the anticipation of dialysis access.  She unfortunately he was admitted to the hospital and suffered a prolonged stay secondary to heart failure and cardiomyopathy.  During this process she initiated dialysis.  She had a ischemic right leg which required surgery and fasciotomies.  She is still undergoing wound care for these.  From a carotid perspective, the patient remained asymptomatic.  Her most recent ultrasound shows greater than 80% left carotid stenosis and 60-79% right carotid stenosis.  The patient has a history of stage I lung cancer requiring lobectomy.  She did not get chemotherapy.  She is now on Coumadin.  She takes a statin for hypercholesterolemia.  She is also medically managed for hypertension.  She suffers from diabetes which historically has not been well controlled.  Past Medical History  Diagnosis Date  . High cholesterol   . Diabetic retinopathy   . Peripheral neuropathy     "tips of toes"  . Blind right eye   . CHF (congestive heart failure)   . CAD (coronary artery disease)   . GERD (gastroesophageal reflux disease)   . Hypertension   . History of lung cancer 07/2011    s/p left lower lobectomy  . Diabetes mellitus     IDDM  . Cataract of right eye   . Ulcer of toe of right foot 07/10/2012    great toe  . Breast calcification, left 06/2012  . Acute biphenotypic leukemia   . CKD (chronic kidney disease) stage 5, GFR less than 15 ml/min   . Nephrotic syndrome   . Gastritis     H/o gastritis on prior endoscopy  . Anemia   . Carotid artery disease     Past Surgical History    Procedure Laterality Date  . Cardiac catheterization  07/16/2011  . Incision and drainage breast abscess Left   . Tubal ligation  1994  . Vitrectomy  2010    2 on left, 1 on right  . Cesarean section  1991; 1994  . Video assisted thoracoscopy (vats)/ lobectomy Left 07/30/2011    left main thoracotomy, left lower lobectomy, mediastinal lymph node dissection  . Lobectomy    . Colonoscopy with esophagogastroduodenoscopy (egd) N/A 08/14/2012    Procedure: COLONOSCOPY WITH ESOPHAGOGASTRODUODENOSCOPY (EGD);  Surgeon: Danie Binder, MD;  Location: [facility];  Service: Endoscopy;  Laterality: N/A;  10:45-moved to [address] to notify pt  . Breast lumpectomy with needle localization Left 11/14/2012    Procedure: BREAST LUMPECTOMY WITH NEEDLE LOCALIZATION;  Surgeon: Marcello Moores A. Cornett, MD;  Location: [place];  Service: General;  Laterality: Left;  . Insertion of dialysis catheter Left 09/12/2013    Procedure: INSERTION OF DIALYSIS CATHETER;  Surgeon: Mal Misty, MD;  Location: [place];  Service: Vascular;  Laterality: Left;  . Embolectomy Right 09/17/2013    Procedure: Thrombectomy of Right Common Femoral Artery;  Surgeon: Elam Dutch, MD;  Location: [facility] OR;  Service: Vascular;  Laterality: Right;  . Endarterectomy femoral Right 09/17/2013    Procedure: Right Femoral Endarterectomy;  Surgeon: Elam Dutch, MD;  Location: [facility] OR;  Service: Vascular;  Laterality: Right;  . Patch angioplasty Right 09/17/2013    Procedure: Vein Patch Angioplasty of Right Femoral Artery;  Surgeon: Elam Dutch, MD;  Location: [facility] OR;  Service: Vascular;  Laterality: Right;  . Fasciotomy Right 09/17/2013    Procedure: Four Compartment Fasciotomy;  Surgeon: Elam Dutch, MD;  Location: [place];  Service: Vascular;  Laterality: Right;  . Fasciotomy closure Right 09/19/2013    Procedure: FASCIOTOMY CLOSURE;  Surgeon: Elam Dutch, MD;  Location: [place];  Service: Vascular;  Laterality: Right;  regional block and  monitored anesthesia care used  . I&d extremity Right 09/28/2013    Procedure: IRRIGATION AND DEBRIDEMENT EXTREMITY;  Surgeon: Serafina Mitchell, MD;  Location: [place];  Service: Vascular;  Laterality: Right;  . Application of wound vac Right 09/28/2013    Procedure: APPLICATION OF WOUND VAC;  Surgeon: Serafina Mitchell, MD;  Location: [place];  Service: Vascular;  Laterality: Right;    History   Social History  . Marital Status: Married    Spouse Name: N/A    Number of Children: 3  . Years of Education: N/A   Occupational History  . Not on file.   Social History Main Topics  . Smoking status: Former Smoker -- 2.00 packs/day for 30 years    Quit date: 08/01/2011  . Smokeless tobacco: Never Used  . Alcohol Use: No  . Drug Use: No  . Sexual Activity: Yes   Other Topics Concern  . Not on file   Social History Narrative  . No narrative on file    Family History  Problem Relation Age of Onset  . Coronary artery disease Father   . Asthma Father   . COPD Father   . Hypertension Father   . Hyperlipidemia Father   . Diabetes Father   . Congestive Heart Failure Father   . Heart disease Father     before age 22  . Heart attack Father   . Peripheral vascular disease Father   . Hypertension Mother   . Hyperlipidemia Mother   . Diabetes Mother   . Cancer Mother   . Cancer Maternal Aunt      three aunts, bone, breast, ?  . Hypertension Brother   . Diabetes Brother   . Diabetes Sister   . Colon cancer Neg Hx   . Celiac disease Neg Hx   . Crohn's disease Neg Hx   . Ulcerative colitis Neg Hx   . Lung cancer Maternal Grandmother     Allergies as of 12/17/2013 - Review Complete 12/17/2013  Allergen Reaction Noted  . Crestor [rosuvastatin] Other (See Comments) 11/02/2012  . Nsaids Other (See Comments) 05/05/2013  . Ciprofloxacin Rash 05/31/2011    Current Outpatient Prescriptions on File Prior to Visit  Medication Sig Dispense Refill  . acetaminophen (TYLENOL) 500 MG tablet  Take 1,000 mg by mouth every 8 (eight) hours as needed for mild pain.       Marland Kitchen aspirin EC 81 MG EC tablet Take 1 tablet (81 mg total) by mouth daily.  30 tablet  0  . atorvastatin (LIPITOR) 80 MG tablet Take 1 tablet (80 mg total) by mouth daily at 6 PM.  30 tablet  6  . calcium acetate (PHOSLO) 667 MG capsule Take 2 capsules (1,334 mg total) by mouth 3 (three) times daily with meals.  90 capsule  0  . clindamycin (CLEOCIN) 300 MG capsule Take 1 capsule (300 mg total)  by mouth 3 (three) times daily.  30 capsule  0  . cyclobenzaprine (FLEXERIL) 10 MG tablet Take 10 mg by mouth 3 (three) times daily as needed for muscle spasms.      Marland Kitchen HYDROcodone-acetaminophen (NORCO) 5-325 MG per tablet Take 1 tablet by mouth every 6 (six) hours as needed for moderate pain or severe pain.  30 tablet  0  . Insulin Detemir (LEVEMIR FLEXPEN) 100 UNIT/ML Pen Inject 20 Units into the skin every evening.  15 mL  11  . insulin lispro (HUMALOG KWIKPEN) 100 UNIT/ML KiwkPen Inject 0.05 mLs (5 Units total) into the skin 3 (three) times daily with meals.  15 mL  0  . methylcellulose (ARTIFICIAL TEARS) 1 % ophthalmic solution Place 1 drop into both eyes at bedtime as needed (dry eyes).      . midodrine (PROAMATINE) 10 MG tablet Take 0.5 tablets (5 mg total) by mouth 3 (three) times daily with meals. Until 12/18/13 then only before HD  90 tablet  6  . multivitamin (RENA-VIT) TABS tablet Take 1 tablet by mouth daily.      . pantoprazole (PROTONIX) 40 MG tablet Take 1 tablet (40 mg total) by mouth daily.  30 tablet  6  . polyethylene glycol (MIRALAX / GLYCOLAX) packet Take 17 g by mouth daily as needed (constipation).  14 each  6  . promethazine (PHENERGAN) 25 MG tablet Take 1 tablet (25 mg total) by mouth every 8 (eight) hours as needed for nausea or vomiting.  20 tablet  0  . traMADol (ULTRAM) 50 MG tablet Take 1 tablet (50 mg total) by mouth every 6 (six) hours as needed for moderate pain.  60 tablet  0  . ursodiol (ACTIGALL) 300 MG  capsule Take 1 capsule (300 mg total) by mouth 2 (two) times daily.  60 capsule  4  . warfarin (COUMADIN) 1 MG tablet Take 1 mg by mouth See admin instructions. Take 1mg  on Friday and Saturday. Sunday take 0.5mg .       No current facility-administered medications on file prior to visit.     REVIEW OF SYSTEMS: Cardiovascular: Recent prolonged hospitalization for heart failure requiring mechanical as well as vasoactive agents for blood pressure support.  Ischemic right leg following removal of mechanical cardiac device. Pulmonary: No productive cough, asthma or wheezing. Neurologic: No weakness, paresthesias, aphasia, or amaurosis. No dizziness. Hematologic: No bleeding problems or clotting disorders. Musculoskeletal: No joint pain or joint swelling. Gastrointestinal:  positive for nausea.  History of blood in stool Psychiatric:: No history of major depression. Integumentary: Open wound of right groin and right calf  Constitutional: No fever or chills.  PHYSICAL EXAMINATION:   Vital signs are BP 109/47  Pulse 113  Temp(Src) 97.6 F (36.4 C) (Oral)  Ht 5\' 7"  (1.702 m)  Wt 265 lb (120.203 kg)  BMI 41.50 kg/m2  SpO2 99%  LMP 09/18/2013 General: The patient appears their stated age. HEENT:  No gross abnormalities Pulmonary:  Non labored breathing Abdomen: Soft and non-tender Musculoskeletal: There are no major deformities. Neurologic: No focal weakness or paresthesias are detected, Skin: beefy red granulation tissue at the right groin wound and right medial fasciotomy incision. Psychiatric: The patient has normal affect. Cardiovascular:  palpable right radial and brachial pulse.  Left cyanosis are not palpable in the arm.   Diagnostic Studies  I have reviewed her vein mapping which shows adequate cephalic vein in the right arm.  She is monophasic left arm arterial circulation, suggesting left subclavian stenosis.  I have reviewed her carotid Doppler studies which showed greater  than 80% left carotid stenosis and 60-79% right carotid stenosis   Assessment:  #1: End-stage renal disease #2: Carotid stenosis Plan:  #1: The patient currently uses a left-sided catheter for dialysis.  She dialyzes on Tuesday Thursday Saturday.  I discussed proceeding with right arm AV fistula.  We discussed the risks and benefits including the risk of steal, non-maturity, and the need for future interventions.  This is been scheduled for Thursday, September 10.  I will discuss with cardiology the management of her anticoagulation. #2: The patient has a high-grade left carotid stenosis.  With her cardiac history, I do not think she is a good candidate for carotid endarterectomy, given the need for general anesthesia.  She was recently in the hospital for biliary colic, and cholecystectomy was not an option given the need for general anesthesia.  I have reviewed her old CT scans of the chest.  I think she has favorable anatomy for carotid stenting.  She is scheduled to stop her Coumadin in approximately 6 weeks.  At that time she'll be transitioned to Plavix.  I have recommended proceeding with carotid stenting once she is off her Coumadin and on Plavix.    Eldridge Abrahams, M.D. [facility] Office: [phone] Pager:  [phone]

## 2013-12-18 ENCOUNTER — Ambulatory Visit: Payer: Self-pay | Admitting: Cardiology

## 2013-12-18 ENCOUNTER — Encounter: Payer: Self-pay | Admitting: Family Medicine

## 2013-12-18 ENCOUNTER — Telehealth: Payer: Self-pay

## 2013-12-18 ENCOUNTER — Ambulatory Visit (INDEPENDENT_AMBULATORY_CARE_PROVIDER_SITE_OTHER): Payer: Medicare Other | Admitting: Family Medicine

## 2013-12-18 VITALS — BP 126/72 | HR 64 | Temp 97.4°F | Resp 12 | Ht 65.0 in | Wt 263.0 lb

## 2013-12-18 DIAGNOSIS — E11349 Type 2 diabetes mellitus with severe nonproliferative diabetic retinopathy without macular edema: Secondary | ICD-10-CM

## 2013-12-18 DIAGNOSIS — I82A11 Acute embolism and thrombosis of right axillary vein: Secondary | ICD-10-CM

## 2013-12-18 DIAGNOSIS — Z5181 Encounter for therapeutic drug level monitoring: Secondary | ICD-10-CM

## 2013-12-18 DIAGNOSIS — E1139 Type 2 diabetes mellitus with other diabetic ophthalmic complication: Secondary | ICD-10-CM

## 2013-12-18 DIAGNOSIS — L03119 Cellulitis of unspecified part of limb: Secondary | ICD-10-CM

## 2013-12-18 DIAGNOSIS — E785 Hyperlipidemia, unspecified: Secondary | ICD-10-CM

## 2013-12-18 DIAGNOSIS — N186 End stage renal disease: Secondary | ICD-10-CM

## 2013-12-18 DIAGNOSIS — E113499 Type 2 diabetes mellitus with severe nonproliferative diabetic retinopathy without macular edema, unspecified eye: Secondary | ICD-10-CM

## 2013-12-18 DIAGNOSIS — L02419 Cutaneous abscess of limb, unspecified: Secondary | ICD-10-CM

## 2013-12-18 DIAGNOSIS — I2510 Atherosclerotic heart disease of native coronary artery without angina pectoris: Secondary | ICD-10-CM

## 2013-12-18 DIAGNOSIS — E1149 Type 2 diabetes mellitus with other diabetic neurological complication: Secondary | ICD-10-CM

## 2013-12-18 DIAGNOSIS — L97509 Non-pressure chronic ulcer of other part of unspecified foot with unspecified severity: Secondary | ICD-10-CM

## 2013-12-18 DIAGNOSIS — L97511 Non-pressure chronic ulcer of other part of right foot limited to breakdown of skin: Secondary | ICD-10-CM

## 2013-12-18 DIAGNOSIS — L03115 Cellulitis of right lower limb: Secondary | ICD-10-CM

## 2013-12-18 DIAGNOSIS — Z992 Dependence on renal dialysis: Secondary | ICD-10-CM

## 2013-12-18 LAB — COMPREHENSIVE METABOLIC PANEL
ALBUMIN: 3.3 g/dL — AB (ref 3.5–5.2)
ALT: 63 U/L — AB (ref 0–35)
AST: 24 U/L (ref 0–37)
Alkaline Phosphatase: 100 U/L (ref 39–117)
BILIRUBIN TOTAL: 0.6 mg/dL (ref 0.2–1.2)
BUN: 46 mg/dL — ABNORMAL HIGH (ref 6–23)
CO2: 25 mEq/L (ref 19–32)
Calcium: 9.3 mg/dL (ref 8.4–10.5)
Chloride: 98 mEq/L (ref 96–112)
Creat: 5.51 mg/dL — ABNORMAL HIGH (ref 0.50–1.10)
Glucose, Bld: 127 mg/dL — ABNORMAL HIGH (ref 70–99)
Potassium: 4.7 mEq/L (ref 3.5–5.3)
SODIUM: 139 meq/L (ref 135–145)
TOTAL PROTEIN: 5.6 g/dL — AB (ref 6.0–8.3)

## 2013-12-18 LAB — LIPID PANEL
CHOLESTEROL: 76 mg/dL (ref 0–200)
HDL: 25 mg/dL — ABNORMAL LOW (ref >39)
LDL CALC: 26 mg/dL (ref 0–99)
Total CHOL/HDL Ratio: 3 Ratio
Triglycerides: 127 mg/dL (ref <150)
VLDL: 25 mg/dL (ref 0–40)

## 2013-12-18 LAB — HEMOGLOBIN A1C
Hgb A1c MFr Bld: 6.9 % — ABNORMAL HIGH (ref <5.7)
MEAN PLASMA GLUCOSE: 151 mg/dL — AB (ref <117)

## 2013-12-18 MED ORDER — HYDROCODONE-ACETAMINOPHEN 5-325 MG PO TABS: 1.0000 | ORAL_TABLET | Freq: Four times a day (QID) | ORAL | Status: DC | PRN

## 2013-12-18 NOTE — Telephone Encounter (Signed)
Message copied by Gregery Na on Tue Dec 18, 2013 12:03 PM ------      Message from: Larey Dresser      Created: Tue Dec 18, 2013  1:07 AM       3 months of coumadin for upper extremity DVT will be completed on 12/21/13.  She can stop coumadin then and start ASA 81.       ----- Message -----         From: Coralee North, RN         Sent: 12/17/2013  11:32 AM           To: Larey Dresser, MD            Dr. Trula Slade has scheduled a (R) arm AVF creation for 12/27/13 and would like to stop Coumadin prior. Would you give insight on when this should be stopped? Thank you.       ------

## 2013-12-18 NOTE — Telephone Encounter (Signed)
Advised patient of Dr. Claris Gladden recommendation to stop Coumadin on 12/21/13 and begin Aspirin 81mg . Patient verbalized understanding.

## 2013-12-18 NOTE — Telephone Encounter (Signed)
Spoke with Hannah [name] and informed that there was note in her chart from Dr Aundra Dubin that she may discontinue Coumadin on Sept 4th and start ASA 81mg  daily.Informed Hannah [name] will call pt as she has already been notified by Estanislado Pandy of these orders and will do coumadin encounter and after that discharge her as she will no longer be on Coumadin

## 2013-12-18 NOTE — Patient Instructions (Signed)
We will call with labs results Decrease the levemir to 15 units Complete antibiotics F/U 2 months

## 2013-12-19 ENCOUNTER — Encounter: Payer: Self-pay | Admitting: Family Medicine

## 2013-12-19 NOTE — Assessment & Plan Note (Signed)
I will recheck her A1c today. She's now on dialysis and she is noticing some drops in her insulin will likely be able to decrease her Levemir to 15 units and continue her on the mealtime coverage.

## 2013-12-19 NOTE — Assessment & Plan Note (Signed)
ON HD

## 2013-12-19 NOTE — Assessment & Plan Note (Signed)
Cellulitis is much improved she will complete her antibiotics Regarding the fasciotomy she will followup with her surgeon for this

## 2013-12-19 NOTE — Assessment & Plan Note (Signed)
She is on Lipitor 80 mg her recheck her lipid panel make sure her LDL is at goal less than 100

## 2013-12-19 NOTE — Progress Notes (Signed)
Patient ID: Hannah [name], female   DOB: 11-13-1970, 43 y.o.   MRN: [id number]   Subjective:    Patient ID: Hannah [name], female    DOB: 13-Aug-1970, 43 y.o.   MRN: [id number]  Patient presents for DM F/U and Handicap Papers  patient here for followup she's had a significant past 3 months with multiple hospitalizations. She was hospitalized from May 18 2 July 13 after she had presented with shortness of breath and chest pain she had a cardiac catheterization done and she subsequently went into cardiac shock this resulted in an embolus to her right lower extremity which she had to have a fasciotomy done they also had difficulty maintaining her blood pressures and she was on pressors for some time. She was also started on hemodialysis. She returned to the hospital in July secondary to cellulitis of the right lower extremity which is the same when she had the fasciotomy and she improved with IV antibiotics however have return of symptoms about a week ago which are restart her on clindamycin and symptoms are improving. She's [facility]. She was again hospitalized in August secondary to biliary colic/ gallstones and cholecystitis however due to her multiple hospitalizations and complications from her multiple comorbidities decision was made not to operate on her her cholecystitis did resolve with IV antibiotics she did not have to have a tube placed which will be the plan if it does flare up again.  She's currently on Coumadin daily secondary to a DVT that occurred in her right upper sure many she will be on this for a total of 3 months and then be transitioned to Plavix. After she is on Plavix and we'll plan to have her carotid stenting done.  Diabetes mellitus her last A1c was elevated at 8% she's been taking Levemir 20 units and Humalog 5 units with each meal. She has noticed that her blood sugars fasting have been 90-110 she's actually had quite a few  hypoglycemic episodes with fasting blood sugars in the 40s to 50s    Review Of Systems:  GEN- denies fatigue, fever, weight loss,weakness, recent illness HEENT- denies eye drainage, change in vision, nasal discharge, CVS- denies chest pain, palpitations RESP- denies SOB, cough, wheeze ABD- denies N/V, change in stools, abd pain GU- denies dysuria, hematuria, dribbling, incontinence MSK- + joint pain, muscle aches, injury Neuro- denies headache, dizziness, syncope, seizure activity       Objective:    BP 126/72  Pulse 64  Temp(Src) 97.4 F (36.3 C) (Oral)  Resp 12  Ht 5\' 5"  (1.651 m)  Wt 263 lb (119.296 kg)  BMI 43.77 kg/m2  LMP 09/18/2013 GEN- NAD, alert and oriented x3, well appearing  HEENT- PERRL, EOMI, non injected sclera, pink conjunctiva, MMM, oropharynx clear Neck- Supple,  CVS- RRR, no murmur RESP-CTAB Port a cath d/c/i ABD-NABS,soft,NT,ND, large pannus Skin- right calf bandaged, erythema bilat legs, no induration noted Pre-ulcerative Callus right great toe, , tiny necrotic areas on tip of 3rd and 4th toes EXT-pedal edema Pulses- Radial, DP- 1+       Assessment & Plan:      Problem List Items Addressed This Visit   Type II or unspecified type diabetes mellitus with neurological manifestations, not stated as uncontrolled - Primary     I will recheck her A1c today. She's now on dialysis and she is noticing some drops in her insulin will likely be able to decrease her Levemir to 15  units and continue her on the mealtime coverage.    Relevant Orders      Comprehensive metabolic panel (Completed)      Hemoglobin A1c (Completed)   Hyperlipidemia     She is on Lipitor 80 mg her recheck her lipid panel make sure her LDL is at goal less than 100    Relevant Orders      Lipid panel (Completed)   ESRD (end stage renal disease) on dialysis (Chronic)     ON HD    Diabetic retinopathy associated with type 2 diabetes mellitus (Chronic)   Chronic toe ulcer      Currently stable with her recent problems there be no surgical intervention on this at this time    Cellulitis of right lower extremity     Cellulitis is much improved she will complete her antibiotics Regarding the fasciotomy she will followup with her surgeon for this       Note: This dictation was prepared with Dragon dictation along with smaller phrase technology. Any transcriptional errors that result from this process are unintentional.

## 2013-12-19 NOTE — Assessment & Plan Note (Signed)
Currently stable with her recent problems there be no surgical intervention on this at this time

## 2013-12-21 ENCOUNTER — Encounter: Payer: Self-pay | Admitting: *Deleted

## 2013-12-25 ENCOUNTER — Telehealth: Payer: Self-pay | Admitting: Pharmacist

## 2013-12-25 NOTE — Telephone Encounter (Signed)
Pt called to report her surgery has been moved from 9/10 to 9/17.  She took her last dose of Coumadin on 9/6 and wants to know what to do.  Will have her take 3mg  today and tomorrow then 2mg  daily again.  Last dose on 9/11 prior to procedure on 9/17.  Plan to stop Coumadin and remain on ASA only after procedure.

## 2013-12-26 ENCOUNTER — Other Ambulatory Visit: Payer: Self-pay | Admitting: Family Medicine

## 2013-12-28 NOTE — Telephone Encounter (Signed)
okay

## 2013-12-28 NOTE — Telephone Encounter (Signed)
RX sent

## 2013-12-28 NOTE — Telephone Encounter (Signed)
Ok to refill??  Last office visit 12/18/2013.  Last refill 11/25/2013.

## 2013-12-29 ENCOUNTER — Telehealth: Payer: Self-pay | Admitting: *Deleted

## 2013-12-29 NOTE — Telephone Encounter (Signed)
Received call from [place], [facility] [phone].  Reports that patient has around 15 red, fluid filled areas that are not open or draining to inner thighs.   States that patient denies C/O pain or itching.   MD made aware and advised to keep area clean and dry and to contact our office if areas change.

## 2014-01-02 ENCOUNTER — Encounter (HOSPITAL_COMMUNITY): Payer: Self-pay | Admitting: *Deleted

## 2014-01-02 MED ORDER — DEXTROSE 5 % IV SOLN
1.5000 g | INTRAVENOUS | Status: AC
  Administered 2014-01-03: 1.5 g via INTRAVENOUS
  Filled 2014-01-02: qty 1.5

## 2014-01-03 ENCOUNTER — Ambulatory Visit (HOSPITAL_COMMUNITY): Payer: Medicare Other | Admitting: Anesthesiology

## 2014-01-03 ENCOUNTER — Telehealth: Payer: Self-pay | Admitting: Vascular Surgery

## 2014-01-03 ENCOUNTER — Other Ambulatory Visit: Payer: Self-pay | Admitting: *Deleted

## 2014-01-03 ENCOUNTER — Encounter (HOSPITAL_COMMUNITY): Payer: Self-pay | Admitting: *Deleted

## 2014-01-03 ENCOUNTER — Ambulatory Visit (HOSPITAL_COMMUNITY)
Admission: RE | Admit: 2014-01-03 | Discharge: 2014-01-03 | Disposition: A | Discharge status: Home / Self Care | Payer: Medicare Other | Source: Ambulatory Visit | Attending: Surgery | Admitting: Surgery

## 2014-01-03 ENCOUNTER — Ambulatory Visit (HOSPITAL_COMMUNITY): Payer: Medicare Other

## 2014-01-03 ENCOUNTER — Encounter (HOSPITAL_COMMUNITY): Payer: Medicare Other | Admitting: Anesthesiology

## 2014-01-03 ENCOUNTER — Encounter (HOSPITAL_COMMUNITY)
Admission: RE | Disposition: A | Discharge status: Home / Self Care | Payer: Self-pay | Source: Ambulatory Visit | Attending: Surgery

## 2014-01-03 DIAGNOSIS — Z7901 Long term (current) use of anticoagulants: Secondary | ICD-10-CM | POA: Diagnosis not present

## 2014-01-03 DIAGNOSIS — I2510 Atherosclerotic heart disease of native coronary artery without angina pectoris: Secondary | ICD-10-CM | POA: Diagnosis not present

## 2014-01-03 DIAGNOSIS — N186 End stage renal disease: Secondary | ICD-10-CM | POA: Diagnosis not present

## 2014-01-03 DIAGNOSIS — I252 Old myocardial infarction: Secondary | ICD-10-CM | POA: Diagnosis not present

## 2014-01-03 DIAGNOSIS — N185 Chronic kidney disease, stage 5: Secondary | ICD-10-CM

## 2014-01-03 DIAGNOSIS — E11319 Type 2 diabetes mellitus with unspecified diabetic retinopathy without macular edema: Secondary | ICD-10-CM | POA: Diagnosis not present

## 2014-01-03 DIAGNOSIS — I509 Heart failure, unspecified: Secondary | ICD-10-CM | POA: Diagnosis not present

## 2014-01-03 DIAGNOSIS — I120 Hypertensive chronic kidney disease with stage 5 chronic kidney disease or end stage renal disease: Secondary | ICD-10-CM | POA: Insufficient documentation

## 2014-01-03 DIAGNOSIS — E1149 Type 2 diabetes mellitus with other diabetic neurological complication: Secondary | ICD-10-CM | POA: Insufficient documentation

## 2014-01-03 DIAGNOSIS — E1139 Type 2 diabetes mellitus with other diabetic ophthalmic complication: Secondary | ICD-10-CM | POA: Diagnosis not present

## 2014-01-03 DIAGNOSIS — Z4931 Encounter for adequacy testing for hemodialysis: Secondary | ICD-10-CM

## 2014-01-03 DIAGNOSIS — Z992 Dependence on renal dialysis: Secondary | ICD-10-CM | POA: Diagnosis not present

## 2014-01-03 DIAGNOSIS — Z886 Allergy status to analgesic agent status: Secondary | ICD-10-CM | POA: Insufficient documentation

## 2014-01-03 DIAGNOSIS — K219 Gastro-esophageal reflux disease without esophagitis: Secondary | ICD-10-CM | POA: Insufficient documentation

## 2014-01-03 DIAGNOSIS — Z7982 Long term (current) use of aspirin: Secondary | ICD-10-CM | POA: Diagnosis not present

## 2014-01-03 DIAGNOSIS — Z87891 Personal history of nicotine dependence: Secondary | ICD-10-CM | POA: Insufficient documentation

## 2014-01-03 DIAGNOSIS — H269 Unspecified cataract: Secondary | ICD-10-CM | POA: Insufficient documentation

## 2014-01-03 DIAGNOSIS — E1142 Type 2 diabetes mellitus with diabetic polyneuropathy: Secondary | ICD-10-CM | POA: Insufficient documentation

## 2014-01-03 DIAGNOSIS — Z881 Allergy status to other antibiotic agents status: Secondary | ICD-10-CM | POA: Diagnosis not present

## 2014-01-03 HISTORY — PX: AV FISTULA PLACEMENT: SHX1204

## 2014-01-03 HISTORY — DX: Acute myocardial infarction, unspecified: I21.9

## 2014-01-03 HISTORY — DX: Calculus of gallbladder without cholecystitis without obstruction: K80.20

## 2014-01-03 LAB — POCT I-STAT 4, (NA,K, GLUC, HGB,HCT)
Glucose, Bld: 77 mg/dL (ref 70–99)
HCT: 38 % (ref 36.0–46.0)
Hemoglobin: 12.9 g/dL (ref 12.0–15.0)
Potassium: 3.8 mEq/L (ref 3.7–5.3)
Sodium: 139 mEq/L (ref 137–147)

## 2014-01-03 LAB — APTT: APTT: 32 s (ref 24–37)

## 2014-01-03 LAB — PROTIME-INR
INR: 1.25 (ref 0.00–1.49)
Prothrombin Time: 15.7 seconds — ABNORMAL HIGH (ref 11.6–15.2)

## 2014-01-03 LAB — GLUCOSE, CAPILLARY: GLUCOSE-CAPILLARY: 95 mg/dL (ref 70–99)

## 2014-01-03 LAB — HCG, SERUM, QUALITATIVE: PREG SERUM: NEGATIVE

## 2014-01-03 SURGERY — ARTERIOVENOUS (AV) FISTULA CREATION
Anesthesia: Monitor Anesthesia Care | Site: Arm Upper | Laterality: Right

## 2014-01-03 MED ORDER — LIDOCAINE HCL (CARDIAC) 20 MG/ML IV SOLN
INTRAVENOUS | Status: DC | PRN
  Administered 2014-01-03: 50 mg via INTRAVENOUS

## 2014-01-03 MED ORDER — HEPARIN SODIUM (PORCINE) 1000 UNIT/ML IJ SOLN
INTRAMUSCULAR | Status: DC | PRN
  Administered 2014-01-03: 7000 [IU] via INTRAVENOUS

## 2014-01-03 MED ORDER — ONDANSETRON HCL 4 MG/2ML IJ SOLN: 4.0000 mg | Freq: Once | INTRAMUSCULAR | Status: DC | PRN

## 2014-01-03 MED ORDER — PROPOFOL INFUSION 10 MG/ML OPTIME
INTRAVENOUS | Status: DC | PRN
  Administered 2014-01-03: 100 ug/kg/min via INTRAVENOUS

## 2014-01-03 MED ORDER — HYDROMORPHONE HCL 1 MG/ML IJ SOLN
INTRAMUSCULAR | Status: AC
  Filled 2014-01-03: qty 1

## 2014-01-03 MED ORDER — OXYCODONE-ACETAMINOPHEN 5-325 MG PO TABS: 1.0000 | ORAL_TABLET | ORAL | Status: DC | PRN

## 2014-01-03 MED ORDER — FENTANYL CITRATE 0.05 MG/ML IJ SOLN
INTRAMUSCULAR | Status: DC | PRN
  Administered 2014-01-03: 50 ug via INTRAVENOUS

## 2014-01-03 MED ORDER — THROMBIN 20000 UNITS EX SOLR
CUTANEOUS | Status: AC
  Filled 2014-01-03: qty 20000

## 2014-01-03 MED ORDER — LIDOCAINE-EPINEPHRINE (PF) 1 %-1:200000 IJ SOLN
INTRAMUSCULAR | Status: AC
  Filled 2014-01-03: qty 10

## 2014-01-03 MED ORDER — FENTANYL CITRATE 0.05 MG/ML IJ SOLN
INTRAMUSCULAR | Status: AC
  Filled 2014-01-03: qty 5

## 2014-01-03 MED ORDER — HYDROMORPHONE HCL 1 MG/ML IJ SOLN
0.2500 mg | INTRAMUSCULAR | Status: DC | PRN
  Administered 2014-01-03 (×2): 0.5 mg via INTRAVENOUS

## 2014-01-03 MED ORDER — CHLORHEXIDINE GLUCONATE CLOTH 2 % EX PADS: 6.0000 | MEDICATED_PAD | Freq: Once | CUTANEOUS | Status: DC

## 2014-01-03 MED ORDER — 0.9 % SODIUM CHLORIDE (POUR BTL) OPTIME
TOPICAL | Status: DC | PRN
  Administered 2014-01-03: 1000 mL

## 2014-01-03 MED ORDER — PROTAMINE SULFATE 10 MG/ML IV SOLN
INTRAVENOUS | Status: DC | PRN
  Administered 2014-01-03: 10 mg via INTRAVENOUS
  Administered 2014-01-03: 20 mg via INTRAVENOUS
  Administered 2014-01-03: 10 mg via INTRAVENOUS

## 2014-01-03 MED ORDER — MIDAZOLAM HCL 2 MG/2ML IJ SOLN
INTRAMUSCULAR | Status: AC
  Filled 2014-01-03: qty 2

## 2014-01-03 MED ORDER — SODIUM CHLORIDE 0.9 % IR SOLN
Status: DC | PRN
  Administered 2014-01-03: 11:00:00

## 2014-01-03 MED ORDER — SODIUM CHLORIDE 0.9 % IV SOLN
INTRAVENOUS | Status: DC
  Administered 2014-01-03: 09:00:00 via INTRAVENOUS

## 2014-01-03 MED ORDER — SODIUM CHLORIDE 0.9 % IV SOLN
INTRAVENOUS | Status: DC | PRN
  Administered 2014-01-03: 10:00:00 via INTRAVENOUS

## 2014-01-03 MED ORDER — SODIUM CHLORIDE 0.9 % IV SOLN: INTRAVENOUS | Status: DC

## 2014-01-03 MED ORDER — LIDOCAINE-EPINEPHRINE (PF) 1 %-1:200000 IJ SOLN
INTRAMUSCULAR | Status: DC | PRN
  Administered 2014-01-03: 3 mL

## 2014-01-03 MED ORDER — LIDOCAINE HCL (PF) 1 % IJ SOLN
INTRAMUSCULAR | Status: AC
  Filled 2014-01-03: qty 30

## 2014-01-03 MED ORDER — HEPARIN SODIUM (PORCINE) 1000 UNIT/ML IJ SOLN
INTRAMUSCULAR | Status: AC
  Filled 2014-01-03: qty 1

## 2014-01-03 MED ORDER — PROPOFOL 10 MG/ML IV BOLUS
INTRAVENOUS | Status: AC
  Filled 2014-01-03: qty 20

## 2014-01-03 SURGICAL SUPPLY — 42 items
ARMBAND PINK RESTRICT EXTREMIT (MISCELLANEOUS) ×3 IMPLANT
BLADE SURG 10 STRL SS (BLADE) ×3 IMPLANT
CANISTER SUCTION 2500CC (MISCELLANEOUS) ×3 IMPLANT
CANNULA VESSEL 3MM 2 BLNT TIP (CANNULA) ×3 IMPLANT
CLIP TI MEDIUM 6 (CLIP) ×3 IMPLANT
CLIP TI WIDE RED SMALL 6 (CLIP) ×3 IMPLANT
COVER PROBE W GEL 5X96 (DRAPES) IMPLANT
COVER SURGICAL LIGHT HANDLE (MISCELLANEOUS) ×3 IMPLANT
DECANTER SPIKE VIAL GLASS SM (MISCELLANEOUS) ×3 IMPLANT
DERMABOND ADVANCED (GAUZE/BANDAGES/DRESSINGS) ×2
DERMABOND ADVANCED .7 DNX12 (GAUZE/BANDAGES/DRESSINGS) ×1 IMPLANT
DRAIN PENROSE 1/2X12 LTX STRL (WOUND CARE) IMPLANT
ELECT REM PT RETURN 9FT ADLT (ELECTROSURGICAL) ×3
ELECTRODE REM PT RTRN 9FT ADLT (ELECTROSURGICAL) ×1 IMPLANT
GLOVE BIO SURGEON STRL SZ7.5 (GLOVE) ×3 IMPLANT
GLOVE BIOGEL PI IND STRL 6.5 (GLOVE) ×1 IMPLANT
GLOVE BIOGEL PI IND STRL 7.0 (GLOVE) ×1 IMPLANT
GLOVE BIOGEL PI IND STRL 7.5 (GLOVE) ×1 IMPLANT
GLOVE BIOGEL PI IND STRL 8 (GLOVE) ×1 IMPLANT
GLOVE BIOGEL PI INDICATOR 6.5 (GLOVE) ×2
GLOVE BIOGEL PI INDICATOR 7.0 (GLOVE) ×2
GLOVE BIOGEL PI INDICATOR 7.5 (GLOVE) ×2
GLOVE BIOGEL PI INDICATOR 8 (GLOVE) ×2
GLOVE ECLIPSE 6.5 STRL STRAW (GLOVE) ×3 IMPLANT
GLOVE SS BIOGEL STRL SZ 7 (GLOVE) ×1 IMPLANT
GLOVE SUPERSENSE BIOGEL SZ 7 (GLOVE) ×2
GOWN STRL REUS W/ TWL LRG LVL3 (GOWN DISPOSABLE) ×3 IMPLANT
GOWN STRL REUS W/TWL LRG LVL3 (GOWN DISPOSABLE) ×6
KIT BASIN OR (CUSTOM PROCEDURE TRAY) ×3 IMPLANT
KIT ROOM TURNOVER OR (KITS) ×3 IMPLANT
LIGACLIP SM TITANIUM (CLIP) ×3 IMPLANT
NS IRRIG 1000ML POUR BTL (IV SOLUTION) ×3 IMPLANT
PACK CV ACCESS (CUSTOM PROCEDURE TRAY) ×3 IMPLANT
PAD ARMBOARD 7.5X6 YLW CONV (MISCELLANEOUS) ×6 IMPLANT
PROBE PENCIL 8 MHZ STRL DISP (MISCELLANEOUS) ×3 IMPLANT
SPONGE SURGIFOAM ABS GEL 100 (HEMOSTASIS) IMPLANT
SUT PROLENE 6 0 BV (SUTURE) ×3 IMPLANT
SUT VIC AB 3-0 SH 27 (SUTURE) ×2
SUT VIC AB 3-0 SH 27X BRD (SUTURE) ×1 IMPLANT
SUT VICRYL 4-0 PS2 18IN ABS (SUTURE) ×3 IMPLANT
UNDERPAD 30X30 INCONTINENT (UNDERPADS AND DIAPERS) ×3 IMPLANT
WATER STERILE IRR 1000ML POUR (IV SOLUTION) ×3 IMPLANT

## 2014-01-03 NOTE — Discharge Instructions (Signed)

## 2014-01-03 NOTE — Anesthesia Postprocedure Evaluation (Signed)
  Anesthesia Post-op Note  Patient: Hannah [name]  Procedure(s) Performed: Procedure(s): ARTERIOVENOUS (AV) FISTULA CREATION (Right)  Patient Location: PACU  Anesthesia Type:MAC  Level of Consciousness: awake, alert , oriented and patient cooperative  Airway and Oxygen Therapy: Patient Spontanous Breathing  Post-op Pain: mild  Post-op Assessment: Post-op Vital signs reviewed, Patient's Cardiovascular Status Stable, Respiratory Function Stable, Patent Airway, No signs of Nausea or vomiting and Pain level controlled  Post-op Vital Signs: stable  Last Vitals:  Filed Vitals:   01/03/14 1217  BP: 97/58  Pulse: 105  Temp: 36.6 C  Resp: 10    Complications: No apparent anesthesia complications

## 2014-01-03 NOTE — Op Note (Signed)
    NAME: RAMISA DUMAN   MRN: [id number] DOB: [birth year]    DATE OF OPERATION: 01/03/2014  PREOP DIAGNOSIS: Stage V chronic kidney disease  POSTOP DIAGNOSIS: Same  PROCEDURE: right radial cephalic AV  SURGEON: Judeth Cornfield. Scot Dock, MD, FACS  ASSIST: Gerri Lins PA  ANESTHESIA: local with sedation   EBL: minimal  INDICATIONS: KALEAH HAGEMEISTER is a 43 y.o. female who presents for new access.  FINDINGS: cephalic vein was 4.5 mm. Brachial artery was 2.5 mm.  TECHNIQUE: The patient was taken to the operating room and sedated by anesthesia. The right upper extremity was prepped and draped in usual sterile fashion. After the skin was infiltrated with lidocaine, a transverse incision was made just above the antecubital level. Here the cephalic vein was dissected free and was of decent size vein approximately 4.5 mm. The brachial artery was dissected free beneath the fascia. It was very small. I didn't interrogate with the Doppler and this appeared to be the brachial artery. The patient was heparinized. The vein was ligated distally and irrigated out with heparinized saline. The brachial artery was clamped proximally and distally a longitudinal arteriotomy was made. The vein was then sewn end-to-side to the artery using continuous 6-0 Prolene suture. At the completion was a palpable thrill in the fistula and a radial and ulnar signal with the Doppler. The heparin was partially reversed with protamine. The wounds closed the deep layer 3-0 Vicryl and the skin closed with 4-0 Vicryl. Dermabond was applied. The patient tolerated the procedure well and was transferred to the recovery room in stable condition. All needle and sponge counts were correct.  Deitra Mayo, MD, FACS [facility]  DATE OF DICTATION:   01/03/2014

## 2014-01-03 NOTE — Anesthesia Preprocedure Evaluation (Addendum)
Anesthesia Evaluation  Patient identified by MRN, date of birth, ID band Patient awake    Reviewed: Allergy & Precautions, H&P , NPO status , Patient's Chart, lab work & pertinent test results, reviewed documented beta blocker date and time   Airway Mallampati: II TM Distance: >3 FB Neck ROM: full    Dental  (+) Poor Dentition, Dental Advidsory Given, Chipped   Pulmonary former smoker,          Cardiovascular hypertension, + CAD, + Past MI, + Peripheral Vascular Disease and +CHF     Neuro/Psych  Neuromuscular disease    GI/Hepatic GERD-  ,  Endo/Other  diabetes  Renal/GU ESRFRenal disease     Musculoskeletal   Abdominal   Peds  Hematology  (+) anemia ,   Anesthesia Other Findings Normal LV size with severe systolic dysfunction, EF 16-24%. Wall   motion abnormalities as noted above. Moderate diastolic   dysfunction with evidence for elevated LV filling pressure. The   RV was mildly dilated with moderate to severely decreased   systolic function. At least moderate MR, probably   infarct-related.   Reproductive/Obstetrics                         Anesthesia Physical Anesthesia Plan  ASA: III  Anesthesia Plan:    Post-op Pain Management:    Induction:   Airway Management Planned:   Additional Equipment:   Intra-op Plan:   Post-operative Plan:   Informed Consent:   Dental Advisory Given  Plan Discussed with: Anesthesiologist, CRNA and Surgeon  Anesthesia Plan Comments:        Anesthesia Quick Evaluation

## 2014-01-03 NOTE — Addendum Note (Signed)
Addendum created 01/03/14 1402 by Kate Sable, MD   Modules edited: Orders, PRL Based Order Sets

## 2014-01-03 NOTE — H&P (View-Only) (Signed)
Patient name: Hannah [name] MRN: [id number] DOB: 08/14/1970 Sex: female     Chief Complaint  Patient presents with  . Re-evaluation    3 wk f/u - s/p Rt fem endarterectomy/thrombecomy    HISTORY OF PRESENT ILLNESS: The patient is here today for followup.  She is known to me initially for renal failure and carotid stenosis.  She was scheduled to undergo cardiac clearance with the anticipation of dialysis access.  She unfortunately he was admitted to the hospital and suffered a prolonged stay secondary to heart failure and cardiomyopathy.  During this process she initiated dialysis.  She had a ischemic right leg which required surgery and fasciotomies.  She is still undergoing wound care for these.  From a carotid perspective, the patient remained asymptomatic.  Her most recent ultrasound shows greater than 80% left carotid stenosis and 60-79% right carotid stenosis.  The patient has a history of stage I lung cancer requiring lobectomy.  She did not get chemotherapy.  She is now on Coumadin.  She takes a statin for hypercholesterolemia.  She is also medically managed for hypertension.  She suffers from diabetes which historically has not been well controlled.  Past Medical History  Diagnosis Date  . High cholesterol   . Diabetic retinopathy   . Peripheral neuropathy     "tips of toes"  . Blind right eye   . CHF (congestive heart failure)   . CAD (coronary artery disease)   . GERD (gastroesophageal reflux disease)   . Hypertension   . History of lung cancer 07/2011    s/p left lower lobectomy  . Diabetes mellitus     IDDM  . Cataract of right eye   . Ulcer of toe of right foot 07/10/2012    great toe  . Breast calcification, left 06/2012  . Acute biphenotypic leukemia   . CKD (chronic kidney disease) stage 5, GFR less than 15 ml/min   . Nephrotic syndrome   . Gastritis     H/o gastritis on prior endoscopy  . Anemia   . Carotid artery disease     Past Surgical History    Procedure Laterality Date  . Cardiac catheterization  07/16/2011  . Incision and drainage breast abscess Left   . Tubal ligation  1994  . Vitrectomy  2010    2 on left, 1 on right  . Cesarean section  1991; 1994  . Video assisted thoracoscopy (vats)/ lobectomy Left 07/30/2011    left main thoracotomy, left lower lobectomy, mediastinal lymph node dissection  . Lobectomy    . Colonoscopy with esophagogastroduodenoscopy (egd) N/A 08/14/2012    Procedure: COLONOSCOPY WITH ESOPHAGOGASTRODUODENOSCOPY (EGD);  Surgeon: Danie Binder, MD;  [facility];  Service: Endoscopy;  Laterality: N/A;  10:45-moved to [address] to notify pt  . Breast lumpectomy with needle localization Left 11/14/2012    Procedure: BREAST LUMPECTOMY WITH NEEDLE LOCALIZATION;  Surgeon: Marcello Moores A. Cornett, MD;  Location: [place];  Service: General;  Laterality: Left;  . Insertion of dialysis catheter Left 09/12/2013    Procedure: INSERTION OF DIALYSIS CATHETER;  Surgeon: Mal Misty, MD;  Location: [place];  Service: Vascular;  Laterality: Left;  . Embolectomy Right 09/17/2013    Procedure: Thrombectomy of Right Common Femoral Artery;  Surgeon: Elam Dutch, MD;  Location: [facility];  Service: Vascular;  Laterality: Right;  . Endarterectomy femoral Right 09/17/2013    Procedure: Right Femoral Endarterectomy;  Surgeon: Elam Dutch, MD;  Location: [facility] OR;  Service: Vascular;  Laterality: Right;  . Patch angioplasty Right 09/17/2013    Procedure: Vein Patch Angioplasty of Right Femoral Artery;  Surgeon: Elam Dutch, MD;  Location: [facility] OR;  Service: Vascular;  Laterality: Right;  . Fasciotomy Right 09/17/2013    Procedure: Four Compartment Fasciotomy;  Surgeon: Elam Dutch, MD;  Location: [place];  Service: Vascular;  Laterality: Right;  . Fasciotomy closure Right 09/19/2013    Procedure: FASCIOTOMY CLOSURE;  Surgeon: Elam Dutch, MD;  Location: [place];  Service: Vascular;  Laterality: Right;  regional block and  monitored anesthesia care used  . I&d extremity Right 09/28/2013    Procedure: IRRIGATION AND DEBRIDEMENT EXTREMITY;  Surgeon: Serafina Mitchell, MD;  Location: [place];  Service: Vascular;  Laterality: Right;  . Application of wound vac Right 09/28/2013    Procedure: APPLICATION OF WOUND VAC;  Surgeon: Serafina Mitchell, MD;  Location: [place];  Service: Vascular;  Laterality: Right;    History   Social History  . Marital Status: Married    Spouse Name: N/A    Number of Children: 3  . Years of Education: N/A   Occupational History  . Not on file.   Social History Main Topics  . Smoking status: Former Smoker -- 2.00 packs/day for 30 years    Quit date: 08/01/2011  . Smokeless tobacco: Never Used  . Alcohol Use: No  . Drug Use: No  . Sexual Activity: Yes   Other Topics Concern  . Not on file   Social History Narrative  . No narrative on file    Family History  Problem Relation Age of Onset  . Coronary artery disease Father   . Asthma Father   . COPD Father   . Hypertension Father   . Hyperlipidemia Father   . Diabetes Father   . Congestive Heart Failure Father   . Heart disease Father     before age 72  . Heart attack Father   . Peripheral vascular disease Father   . Hypertension Mother   . Hyperlipidemia Mother   . Diabetes Mother   . Cancer Mother   . Cancer Maternal Aunt      three aunts, bone, breast, ?  . Hypertension Brother   . Diabetes Brother   . Diabetes Sister   . Colon cancer Neg Hx   . Celiac disease Neg Hx   . Crohn's disease Neg Hx   . Ulcerative colitis Neg Hx   . Lung cancer Maternal Grandmother     Allergies as of 12/17/2013 - Review Complete 12/17/2013  Allergen Reaction Noted  . Crestor [rosuvastatin] Other (See Comments) 11/02/2012  . Nsaids Other (See Comments) 05/05/2013  . Ciprofloxacin Rash 05/31/2011    Current Outpatient Prescriptions on File Prior to Visit  Medication Sig Dispense Refill  . acetaminophen (TYLENOL) 500 MG tablet  Take 1,000 mg by mouth every 8 (eight) hours as needed for mild pain.       Marland Kitchen aspirin EC 81 MG EC tablet Take 1 tablet (81 mg total) by mouth daily.  30 tablet  0  . atorvastatin (LIPITOR) 80 MG tablet Take 1 tablet (80 mg total) by mouth daily at 6 PM.  30 tablet  6  . calcium acetate (PHOSLO) 667 MG capsule Take 2 capsules (1,334 mg total) by mouth 3 (three) times daily with meals.  90 capsule  0  . clindamycin (CLEOCIN) 300 MG capsule Take 1 capsule (300 mg total)  by mouth 3 (three) times daily.  30 capsule  0  . cyclobenzaprine (FLEXERIL) 10 MG tablet Take 10 mg by mouth 3 (three) times daily as needed for muscle spasms.      Marland Kitchen HYDROcodone-acetaminophen (NORCO) 5-325 MG per tablet Take 1 tablet by mouth every 6 (six) hours as needed for moderate pain or severe pain.  30 tablet  0  . Insulin Detemir (LEVEMIR FLEXPEN) 100 UNIT/ML Pen Inject 20 Units into the skin every evening.  15 mL  11  . insulin lispro (HUMALOG KWIKPEN) 100 UNIT/ML KiwkPen Inject 0.05 mLs (5 Units total) into the skin 3 (three) times daily with meals.  15 mL  0  . methylcellulose (ARTIFICIAL TEARS) 1 % ophthalmic solution Place 1 drop into both eyes at bedtime as needed (dry eyes).      . midodrine (PROAMATINE) 10 MG tablet Take 0.5 tablets (5 mg total) by mouth 3 (three) times daily with meals. Until 12/18/13 then only before HD  90 tablet  6  . multivitamin (RENA-VIT) TABS tablet Take 1 tablet by mouth daily.      . pantoprazole (PROTONIX) 40 MG tablet Take 1 tablet (40 mg total) by mouth daily.  30 tablet  6  . polyethylene glycol (MIRALAX / GLYCOLAX) packet Take 17 g by mouth daily as needed (constipation).  14 each  6  . promethazine (PHENERGAN) 25 MG tablet Take 1 tablet (25 mg total) by mouth every 8 (eight) hours as needed for nausea or vomiting.  20 tablet  0  . traMADol (ULTRAM) 50 MG tablet Take 1 tablet (50 mg total) by mouth every 6 (six) hours as needed for moderate pain.  60 tablet  0  . ursodiol (ACTIGALL) 300 MG  capsule Take 1 capsule (300 mg total) by mouth 2 (two) times daily.  60 capsule  4  . warfarin (COUMADIN) 1 MG tablet Take 1 mg by mouth See admin instructions. Take 1mg  on Friday and Saturday. Sunday take 0.5mg .       No current facility-administered medications on file prior to visit.     REVIEW OF SYSTEMS: Cardiovascular: Recent prolonged hospitalization for heart failure requiring mechanical as well as vasoactive agents for blood pressure support.  Ischemic right leg following removal of mechanical cardiac device. Pulmonary: No productive cough, asthma or wheezing. Neurologic: No weakness, paresthesias, aphasia, or amaurosis. No dizziness. Hematologic: No bleeding problems or clotting disorders. Musculoskeletal: No joint pain or joint swelling. Gastrointestinal:  positive for nausea.  History of blood in stool Psychiatric:: No history of major depression. Integumentary: Open wound of right groin and right calf  Constitutional: No fever or chills.  PHYSICAL EXAMINATION:   Vital signs are BP 109/47  Pulse 113  Temp(Src) 97.6 F (36.4 C) (Oral)  Ht 5\' 7"  (1.702 m)  Wt 265 lb (120.203 kg)  BMI 41.50 kg/m2  SpO2 99%  LMP 09/18/2013 General: The patient appears their stated age. HEENT:  No gross abnormalities Pulmonary:  Non labored breathing Abdomen: Soft and non-tender Musculoskeletal: There are no major deformities. Neurologic: No focal weakness or paresthesias are detected, Skin: beefy red granulation tissue at the right groin wound and right medial fasciotomy incision. Psychiatric: The patient has normal affect. Cardiovascular:  palpable right radial and brachial pulse.  Left cyanosis are not palpable in the arm.   Diagnostic Studies  I have reviewed her vein mapping which shows adequate cephalic vein in the right arm.  She is monophasic left arm arterial circulation, suggesting left subclavian stenosis.  I have reviewed her carotid Doppler studies which showed greater  than 80% left carotid stenosis and 60-79% right carotid stenosis   Assessment:  #1: End-stage renal disease #2: Carotid stenosis Plan:  #1: The patient currently uses a left-sided catheter for dialysis.  She dialyzes on Tuesday Thursday Saturday.  I discussed proceeding with right arm AV fistula.  We discussed the risks and benefits including the risk of steal, non-maturity, and the need for future interventions.  This is been scheduled for Thursday, September 10.  I will discuss with cardiology the management of her anticoagulation. #2: The patient has a high-grade left carotid stenosis.  With her cardiac history, I do not think she is a good candidate for carotid endarterectomy, given the need for general anesthesia.  She was recently in the hospital for biliary colic, and cholecystectomy was not an option given the need for general anesthesia.  I have reviewed her old CT scans of the chest.  I think she has favorable anatomy for carotid stenting.  She is scheduled to stop her Coumadin in approximately 6 weeks.  At that time she'll be transitioned to Plavix.  I have recommended proceeding with carotid stenting once she is off her Coumadin and on Plavix.    Eldridge Abrahams, M.D. [facility] [place] Office: [phone] Pager:  [phone]

## 2014-01-03 NOTE — Interval H&P Note (Signed)
History and Physical Interval Note:  01/03/2014 10:25 AM  Hannah [name]  has presented today for surgery, with the diagnosis of End stage renal disease   The various methods of treatment have been discussed with the patient and family. After consideration of risks, benefits and other options for treatment, the patient has consented to  Procedure(s): ARTERIOVENOUS (AV) FISTULA CREATION (Right) as a surgical intervention .  The patient's history has been reviewed, patient examined, no change in status, stable for surgery.  I have reviewed the patient's chart and labs.  Questions were answered to the patient's satisfaction.     Elajah Kunsman S

## 2014-01-03 NOTE — Progress Notes (Signed)
Notified Dr. Tamala Julian of pt's blood sugar 77. Pt. Is not symptomatic. No treatment at present ordered.

## 2014-01-03 NOTE — Telephone Encounter (Addendum)
Message copied by Doristine Section on Thu Jan 03, 2014  2:51 PM ------      Message from: Peter Minium K      Created: Thu Jan 03, 2014  1:10 PM      Regarding: Schedule                   ----- Message -----         From: Angelia Mould, MD         Sent: 01/03/2014  12:12 PM           To: [facility] Charge Pool      Subject: charge and f/u                                           PROCEDURE: right radial cephalic AV            SURGEON: Judeth Cornfield. Scot Dock, MD, FACS            ASSIST: Gerri Lins PA            She will need a follow up visit in 6 weeks with a duplex at that time to check on the maturation of her fistula. Thank you. CD ------  notified patient of post op appt. with dr. Scot Dock on 02-13-14 at 1pm

## 2014-01-03 NOTE — Transfer of Care (Signed)
Immediate Anesthesia Transfer of Care Note  Patient: Hannah [name]  Procedure(s) Performed: Procedure(s): ARTERIOVENOUS (AV) FISTULA CREATION (Right)  Patient Location: PACU  Anesthesia Type:MAC  Level of Consciousness: awake, alert  and oriented  Airway & Oxygen Therapy: Patient Spontanous Breathing and Patient connected to nasal cannula oxygen  Post-op Assessment: Report given to PACU RN, Post -op Vital signs reviewed and stable and Patient moving all extremities X 4  Post vital signs: Reviewed and stable  Complications: No apparent anesthesia complications

## 2014-01-04 ENCOUNTER — Telehealth: Payer: Self-pay | Admitting: *Deleted

## 2014-01-04 ENCOUNTER — Emergency Department (HOSPITAL_COMMUNITY): Payer: Medicare Other | Admitting: Certified Registered"

## 2014-01-04 ENCOUNTER — Encounter (HOSPITAL_COMMUNITY): Payer: Medicare Other | Admitting: Certified Registered"

## 2014-01-04 ENCOUNTER — Ambulatory Visit (HOSPITAL_COMMUNITY)
Admission: EM | Admit: 2014-01-04 | Discharge: 2014-01-04 | Disposition: A | Discharge status: Home / Self Care | Payer: Medicare Other | Attending: Emergency Medicine | Admitting: Emergency Medicine

## 2014-01-04 ENCOUNTER — Encounter (HOSPITAL_COMMUNITY)
Admission: EM | Disposition: A | Discharge status: Home / Self Care | Payer: Self-pay | Source: Home / Self Care | Attending: Emergency Medicine

## 2014-01-04 ENCOUNTER — Encounter (HOSPITAL_COMMUNITY): Payer: Self-pay | Admitting: Emergency Medicine

## 2014-01-04 DIAGNOSIS — I6529 Occlusion and stenosis of unspecified carotid artery: Secondary | ICD-10-CM | POA: Insufficient documentation

## 2014-01-04 DIAGNOSIS — H5440 Blindness, one eye, unspecified eye: Secondary | ICD-10-CM | POA: Diagnosis not present

## 2014-01-04 DIAGNOSIS — Z87891 Personal history of nicotine dependence: Secondary | ICD-10-CM | POA: Diagnosis not present

## 2014-01-04 DIAGNOSIS — E11319 Type 2 diabetes mellitus with unspecified diabetic retinopathy without macular edema: Secondary | ICD-10-CM | POA: Insufficient documentation

## 2014-01-04 DIAGNOSIS — I2510 Atherosclerotic heart disease of native coronary artery without angina pectoris: Secondary | ICD-10-CM | POA: Insufficient documentation

## 2014-01-04 DIAGNOSIS — T82898A Other specified complication of vascular prosthetic devices, implants and grafts, initial encounter: Secondary | ICD-10-CM

## 2014-01-04 DIAGNOSIS — Y832 Surgical operation with anastomosis, bypass or graft as the cause of abnormal reaction of the patient, or of later complication, without mention of misadventure at the time of the procedure: Secondary | ICD-10-CM | POA: Insufficient documentation

## 2014-01-04 DIAGNOSIS — K219 Gastro-esophageal reflux disease without esophagitis: Secondary | ICD-10-CM | POA: Diagnosis not present

## 2014-01-04 DIAGNOSIS — G5790 Unspecified mononeuropathy of unspecified lower limb: Secondary | ICD-10-CM | POA: Diagnosis not present

## 2014-01-04 DIAGNOSIS — E1139 Type 2 diabetes mellitus with other diabetic ophthalmic complication: Secondary | ICD-10-CM | POA: Diagnosis not present

## 2014-01-04 DIAGNOSIS — I252 Old myocardial infarction: Secondary | ICD-10-CM | POA: Insufficient documentation

## 2014-01-04 DIAGNOSIS — E7800 Pure hypercholesterolemia, unspecified: Secondary | ICD-10-CM | POA: Insufficient documentation

## 2014-01-04 DIAGNOSIS — I120 Hypertensive chronic kidney disease with stage 5 chronic kidney disease or end stage renal disease: Secondary | ICD-10-CM | POA: Insufficient documentation

## 2014-01-04 DIAGNOSIS — N186 End stage renal disease: Secondary | ICD-10-CM | POA: Diagnosis not present

## 2014-01-04 DIAGNOSIS — I509 Heart failure, unspecified: Secondary | ICD-10-CM | POA: Diagnosis not present

## 2014-01-04 DIAGNOSIS — Z85118 Personal history of other malignant neoplasm of bronchus and lung: Secondary | ICD-10-CM | POA: Insufficient documentation

## 2014-01-04 DIAGNOSIS — Z992 Dependence on renal dialysis: Secondary | ICD-10-CM

## 2014-01-04 HISTORY — PX: LIGATION OF ARTERIOVENOUS  FISTULA: SHX5948

## 2014-01-04 LAB — GLUCOSE, CAPILLARY: GLUCOSE-CAPILLARY: 187 mg/dL — AB (ref 70–99)

## 2014-01-04 LAB — I-STAT CHEM 8, ED
BUN: 21 mg/dL (ref 6–23)
CREATININE: 3 mg/dL — AB (ref 0.50–1.10)
Calcium, Ion: 1.02 mmol/L — ABNORMAL LOW (ref 1.12–1.23)
Chloride: 97 mEq/L (ref 96–112)
Glucose, Bld: 176 mg/dL — ABNORMAL HIGH (ref 70–99)
HEMATOCRIT: 39 % (ref 36.0–46.0)
HEMOGLOBIN: 13.3 g/dL (ref 12.0–15.0)
Potassium: 3.9 mEq/L (ref 3.7–5.3)
Sodium: 134 mEq/L — ABNORMAL LOW (ref 137–147)
TCO2: 28 mmol/L (ref 0–100)

## 2014-01-04 SURGERY — LIGATION OF ARTERIOVENOUS  FISTULA
Anesthesia: Monitor Anesthesia Care | Laterality: Right

## 2014-01-04 MED ORDER — HYDROMORPHONE HCL 1 MG/ML IJ SOLN: 2.0000 mg | Freq: Once | INTRAMUSCULAR | Status: DC

## 2014-01-04 MED ORDER — PROPOFOL 10 MG/ML IV BOLUS
INTRAVENOUS | Status: DC | PRN
  Administered 2014-01-04: 30 mg via INTRAVENOUS

## 2014-01-04 MED ORDER — HYDROMORPHONE HCL 1 MG/ML IJ SOLN
0.2500 mg | INTRAMUSCULAR | Status: DC | PRN
  Administered 2014-01-04: 0.5 mg via INTRAVENOUS

## 2014-01-04 MED ORDER — CEFAZOLIN SODIUM-DEXTROSE 2-3 GM-% IV SOLR
INTRAVENOUS | Status: DC | PRN
  Administered 2014-01-04: 2 g via INTRAVENOUS

## 2014-01-04 MED ORDER — LIDOCAINE-EPINEPHRINE 0.5 %-1:200000 IJ SOLN
INTRAMUSCULAR | Status: AC
  Filled 2014-01-04: qty 1

## 2014-01-04 MED ORDER — PROPOFOL 10 MG/ML IV BOLUS
INTRAVENOUS | Status: AC
  Filled 2014-01-04: qty 20

## 2014-01-04 MED ORDER — DEXAMETHASONE SODIUM PHOSPHATE 4 MG/ML IJ SOLN
INTRAMUSCULAR | Status: DC | PRN
  Administered 2014-01-04: 4 mg via INTRAVENOUS

## 2014-01-04 MED ORDER — OXYCODONE HCL 5 MG PO TABS
ORAL_TABLET | ORAL | Status: AC
  Administered 2014-01-04: 5 mg via ORAL
  Filled 2014-01-04: qty 1

## 2014-01-04 MED ORDER — SODIUM CHLORIDE 0.9 % IV SOLN
INTRAVENOUS | Status: DC | PRN
  Administered 2014-01-04: 21:00:00 via INTRAVENOUS

## 2014-01-04 MED ORDER — LIDOCAINE-EPINEPHRINE 0.5 %-1:200000 IJ SOLN
INTRAMUSCULAR | Status: DC | PRN
Start: 2014-01-04 — End: 2014-01-04
  Administered 2014-01-04: 5 mL

## 2014-01-04 MED ORDER — SUFENTANIL CITRATE 50 MCG/ML IV SOLN
INTRAVENOUS | Status: AC
  Filled 2014-01-04: qty 1

## 2014-01-04 MED ORDER — MIDAZOLAM HCL 5 MG/5ML IJ SOLN
INTRAMUSCULAR | Status: DC | PRN
  Administered 2014-01-04: 2 mg via INTRAVENOUS

## 2014-01-04 MED ORDER — LIDOCAINE HCL (CARDIAC) 20 MG/ML IV SOLN
INTRAVENOUS | Status: DC | PRN
  Administered 2014-01-04: 50 mg via INTRAVENOUS

## 2014-01-04 MED ORDER — OXYCODONE HCL 5 MG PO TABS
5.0000 mg | ORAL_TABLET | Freq: Once | ORAL | Status: AC | PRN
  Administered 2014-01-04: 5 mg via ORAL

## 2014-01-04 MED ORDER — PROMETHAZINE HCL 25 MG/ML IJ SOLN: 6.2500 mg | INTRAMUSCULAR | Status: DC | PRN

## 2014-01-04 MED ORDER — HYDROMORPHONE HCL 1 MG/ML IJ SOLN
2.0000 mg | Freq: Once | INTRAMUSCULAR | Status: AC
  Administered 2014-01-04: 2 mg via INTRAMUSCULAR
  Filled 2014-01-04: qty 2

## 2014-01-04 MED ORDER — DIPHENHYDRAMINE HCL 50 MG/ML IJ SOLN
INTRAMUSCULAR | Status: DC | PRN
  Administered 2014-01-04: 10 mg via INTRAVENOUS

## 2014-01-04 MED ORDER — OXYCODONE HCL 5 MG/5ML PO SOLN: 5.0000 mg | Freq: Once | ORAL | Status: AC | PRN

## 2014-01-04 MED ORDER — 0.9 % SODIUM CHLORIDE (POUR BTL) OPTIME
TOPICAL | Status: DC | PRN
  Administered 2014-01-04: 1000 mL

## 2014-01-04 MED ORDER — MIDAZOLAM HCL 2 MG/2ML IJ SOLN
INTRAMUSCULAR | Status: AC
  Filled 2014-01-04: qty 2

## 2014-01-04 MED ORDER — HYDROMORPHONE HCL 1 MG/ML IJ SOLN
INTRAMUSCULAR | Status: AC
  Filled 2014-01-04: qty 1

## 2014-01-04 SURGICAL SUPPLY — 40 items
BENZOIN TINCTURE PRP APPL 2/3 (GAUZE/BANDAGES/DRESSINGS) ×3 IMPLANT
BLADE SURG 10 STRL SS (BLADE) ×3 IMPLANT
CANISTER SUCTION 2500CC (MISCELLANEOUS) ×3 IMPLANT
CLIP LIGATING EXTRA MED SLVR (CLIP) IMPLANT
CLIP LIGATING EXTRA SM BLUE (MISCELLANEOUS) IMPLANT
CLOSURE STERI-STRIP 1/2X4 (GAUZE/BANDAGES/DRESSINGS) ×1
CLOSURE WOUND 1/2 X4 (GAUZE/BANDAGES/DRESSINGS) ×1
CLSR STERI-STRIP ANTIMIC 1/2X4 (GAUZE/BANDAGES/DRESSINGS) ×2 IMPLANT
COVER SURGICAL LIGHT HANDLE (MISCELLANEOUS) ×3 IMPLANT
DECANTER SPIKE VIAL GLASS SM (MISCELLANEOUS) ×3 IMPLANT
ELECT REM PT RETURN 9FT ADLT (ELECTROSURGICAL) ×3
ELECTRODE REM PT RTRN 9FT ADLT (ELECTROSURGICAL) ×1 IMPLANT
GAUZE SPONGE 2X2 8PLY STRL LF (GAUZE/BANDAGES/DRESSINGS) ×1 IMPLANT
GAUZE SPONGE 4X4 12PLY STRL (GAUZE/BANDAGES/DRESSINGS) ×3 IMPLANT
GEL ULTRASOUND 20GR AQUASONIC (MISCELLANEOUS) IMPLANT
GLOVE BIOGEL PI IND STRL 7.5 (GLOVE) ×1 IMPLANT
GLOVE BIOGEL PI INDICATOR 7.5 (GLOVE) ×2
GLOVE SS BIOGEL STRL SZ 7.5 (GLOVE) ×1 IMPLANT
GLOVE SUPERSENSE BIOGEL SZ 7.5 (GLOVE) ×2
GOWN STRL REUS W/ TWL LRG LVL3 (GOWN DISPOSABLE) ×2 IMPLANT
GOWN STRL REUS W/TWL LRG LVL3 (GOWN DISPOSABLE) ×4
KIT BASIN OR (CUSTOM PROCEDURE TRAY) ×3 IMPLANT
KIT ROOM TURNOVER OR (KITS) ×3 IMPLANT
NS IRRIG 1000ML POUR BTL (IV SOLUTION) ×3 IMPLANT
PACK PERIPHERAL VASCULAR (CUSTOM PROCEDURE TRAY) ×3 IMPLANT
PAD ARMBOARD 7.5X6 YLW CONV (MISCELLANEOUS) ×6 IMPLANT
SPONGE GAUZE 2X2 STER 10/PKG (GAUZE/BANDAGES/DRESSINGS) ×2
SPONGE GAUZE 4X4 12PLY STER LF (GAUZE/BANDAGES/DRESSINGS) ×3 IMPLANT
STAPLER VISISTAT 35W (STAPLE) IMPLANT
STOCKINETTE IMPERVIOUS 9X36 MD (GAUZE/BANDAGES/DRESSINGS) ×3 IMPLANT
STRIP CLOSURE SKIN 1/2X4 (GAUZE/BANDAGES/DRESSINGS) ×2 IMPLANT
SUT ETHILON 3 0 PS 1 (SUTURE) IMPLANT
SUT PROLENE 6 0 CC (SUTURE) IMPLANT
SUT SILK 0 TIES 10X30 (SUTURE) ×3 IMPLANT
SUT VIC AB 3-0 SH 27 (SUTURE) ×2
SUT VIC AB 3-0 SH 27X BRD (SUTURE) ×1 IMPLANT
SYR CONTROL 10ML LL (SYRINGE) ×3 IMPLANT
TAPE CLOTH SURG 4X10 WHT LF (GAUZE/BANDAGES/DRESSINGS) ×3 IMPLANT
UNDERPAD 30X30 INCONTINENT (UNDERPADS AND DIAPERS) ×3 IMPLANT
WATER STERILE IRR 1000ML POUR (IV SOLUTION) ×3 IMPLANT

## 2014-01-04 NOTE — Telephone Encounter (Signed)
Pt husband aware of message

## 2014-01-04 NOTE — Transfer of Care (Signed)
Immediate Anesthesia Transfer of Care Note  Patient: Hannah [name]  Procedure(s) Performed: Procedure(s): LIGATION OF ARTERIOVENOUS  FISTULA (Right)  Patient Location: PACU  Anesthesia Type:MAC  Level of Consciousness: awake, alert , oriented and patient cooperative  Airway & Oxygen Therapy: Patient Spontanous Breathing  Post-op Assessment: Report given to PACU RN, Post -op Vital signs reviewed and stable and Patient moving all extremities X 4  Post vital signs: Reviewed and stable  Complications: No apparent anesthesia complications

## 2014-01-04 NOTE — Telephone Encounter (Signed)
Refill HYDROcodone-acetaminophen (NORCO) 5-325 MG per tablet  Please call pt husband to let him know that script is ready for pick up [phone]

## 2014-01-04 NOTE — Anesthesia Preprocedure Evaluation (Deleted)
Anesthesia Evaluation    Reviewed: Allergy & Precautions, H&P , NPO status , Patient's Chart, lab work & pertinent test results  History of Anesthesia Complications Negative for: history of anesthetic complications  Airway       Dental   Pulmonary former smoker,          Cardiovascular hypertension, + CAD, + Past MI, + Peripheral Vascular Disease and +CHF     Neuro/Psych negative neurological ROS  negative psych ROS   GI/Hepatic GERD-  ,  Endo/Other  diabetesMorbid obesity  Renal/GU Dialysis and ESRFRenal disease     Musculoskeletal   Abdominal   Peds  Hematology   Anesthesia Other Findings   Reproductive/Obstetrics                         Anesthesia Physical Anesthesia Plan  ASA: III and emergent  Anesthesia Plan: MAC   Post-op Pain Management:    Induction:   Airway Management Planned: Simple Face Mask  Additional Equipment:   Intra-op Plan:   Post-operative Plan:   Informed Consent:   Plan Discussed with: CRNA, Anesthesiologist and Surgeon  Anesthesia Plan Comments:         Anesthesia Quick Evaluation                                  Anesthesia Evaluation  Patient identified by MRN, date of birth, ID band Patient awake    Reviewed: Allergy & Precautions, H&P , NPO status , Patient's Chart, lab work & pertinent test results, reviewed documented beta blocker date and time   Airway Mallampati: II TM Distance: >3 FB Neck ROM: full    Dental  (+) Poor Dentition, Dental Advidsory Given, Chipped   Pulmonary former smoker,          Cardiovascular hypertension, + CAD, + Past MI, + Peripheral Vascular Disease and +CHF     Neuro/Psych  Neuromuscular disease    GI/Hepatic GERD-  ,  Endo/Other  diabetes  Renal/GU ESRFRenal disease     Musculoskeletal   Abdominal   Peds  Hematology  (+) anemia ,   Anesthesia Other Findings Normal LV size with  severe systolic dysfunction, EF 54-09%. Wall   motion abnormalities as noted above. Moderate diastolic   dysfunction with evidence for elevated LV filling pressure. The   RV was mildly dilated with moderate to severely decreased   systolic function. At least moderate MR, probably   infarct-related.   Reproductive/Obstetrics                         Anesthesia Physical Anesthesia Plan  ASA: III  Anesthesia Plan:    Post-op Pain Management:    Induction:   Airway Management Planned:   Additional Equipment:   Intra-op Plan:   Post-operative Plan:   Informed Consent:   Dental Advisory Given  Plan Discussed with: Anesthesiologist, CRNA and Surgeon  Anesthesia Plan Comments:        Anesthesia Quick Evaluation                                   Anesthesia Evaluation  Patient identified by MRN, date of birth, ID band Patient awake    Reviewed: Allergy & Precautions, H&P , NPO status , Patient's Chart, lab work & pertinent test results, reviewed  documented beta blocker date and time   Airway Mallampati: II TM Distance: >3 FB Neck ROM: full    Dental  (+) Poor Dentition, Dental Advidsory Given, Chipped   Pulmonary former smoker,          Cardiovascular hypertension, + CAD, + Past MI, + Peripheral Vascular Disease and +CHF     Neuro/Psych  Neuromuscular disease    GI/Hepatic GERD-  ,  Endo/Other  diabetes  Renal/GU ESRFRenal disease     Musculoskeletal   Abdominal   Peds  Hematology  (+) anemia ,   Anesthesia Other Findings Normal LV size with severe systolic dysfunction, EF 59-56%. Wall   motion abnormalities as noted above. Moderate diastolic   dysfunction with evidence for elevated LV filling pressure. The   RV was mildly dilated with moderate to severely decreased   systolic function. At least moderate MR, probably   infarct-related.   Reproductive/Obstetrics                          Anesthesia Physical Anesthesia Plan  ASA: III  Anesthesia Plan:    Post-op Pain Management:    Induction:   Airway Management Planned:   Additional Equipment:   Intra-op Plan:   Post-operative Plan:   Informed Consent:   Dental Advisory Given  Plan Discussed with: Anesthesiologist, CRNA and Surgeon  Anesthesia Plan Comments:        Anesthesia Quick Evaluation

## 2014-01-04 NOTE — ED Notes (Signed)
Pt sts she is having right arm pain and numbness where her fistula was placed. sts she had surgery yesterday. Pt received dialysis today through a port in chest.

## 2014-01-04 NOTE — ED Provider Notes (Signed)
CSN: [id number]     Arrival date & time 01/04/14  1815 History   First MD Initiated Contact with Patient 01/04/14 1831     Chief Complaint  Patient presents with  . Arm Pain  . Numbness     (Consider location/radiation/quality/duration/timing/severity/associated sxs/prior Treatment) HPI Comments: Pt reports inc R distal arm pain/paresthesias since around 5pm this evening. She had an AV fistula placed yesterday for HD access. No fever, chills, or neuro symptoms elsewhere. No hand weakness.   Patient is a 43 y.o. female presenting with neurologic complaint. The history is provided by the patient. No language interpreter was used.  Neurologic Problem This is a new problem. The current episode started 3 to 5 hours ago. The problem occurs constantly. The problem has been gradually worsening. Pertinent negatives include no chest pain, no abdominal pain, no headaches and no shortness of breath. Nothing aggravates the symptoms. Nothing relieves the symptoms. She has tried nothing for the symptoms. The treatment provided no relief.    Past Medical History  Diagnosis Date  . High cholesterol   . Diabetic retinopathy   . Peripheral neuropathy     "tips of toes"  . Blind right eye   . CHF (congestive heart failure)   . CAD (coronary artery disease)   . GERD (gastroesophageal reflux disease)   . Hypertension   . History of lung cancer 07/2011    s/p left lower lobectomy  . Diabetes mellitus     IDDM  . Cataract of right eye   . Ulcer of toe of right foot 07/10/2012    great toe  . Breast calcification, left 06/2012  . Acute biphenotypic leukemia   . CKD (chronic kidney disease) stage 5, GFR less than 15 ml/min   . Nephrotic syndrome   . Gastritis     H/o gastritis on prior endoscopy  . Anemia   . Carotid artery disease   . Gallstones   . Myocardial infarction 5/16   Past Surgical History  Procedure Laterality Date  . Cardiac catheterization  07/16/2011  . Incision and drainage breast  abscess Left   . Tubal ligation  1994  . Vitrectomy  2010    2 on left, 1 on right  . Cesarean section  1991; 1994  . Video assisted thoracoscopy (vats)/ lobectomy Left 07/30/2011    left main thoracotomy, left lower lobectomy, mediastinal lymph node dissection  . Lobectomy    . Colonoscopy with esophagogastroduodenoscopy (egd) N/A 08/14/2012    Procedure: COLONOSCOPY WITH ESOPHAGOGASTRODUODENOSCOPY (EGD);  Surgeon: Danie Binder, MD;  Location: [facility];  Service: Endoscopy;  Laterality: N/A;  10:45-moved to [address] to notify pt  . Breast lumpectomy with needle localization Left 11/14/2012    Procedure: BREAST LUMPECTOMY WITH NEEDLE LOCALIZATION;  Surgeon: Marcello Moores A. Cornett, MD;  Location: [place];  Service: General;  Laterality: Left;  . Insertion of dialysis catheter Left 09/12/2013    Procedure: INSERTION OF DIALYSIS CATHETER;  Surgeon: Mal Misty, MD;  Location: [place];  Service: Vascular;  Laterality: Left;  . Embolectomy Right 09/17/2013    Procedure: Thrombectomy of Right Common Femoral Artery;  Surgeon: Elam Dutch, MD;  Location: [facility] OR;  Service: Vascular;  Laterality: Right;  . Endarterectomy femoral Right 09/17/2013    Procedure: Right Femoral Endarterectomy;  Surgeon: Elam Dutch, MD;  Location: [facility] OR;  Service: Vascular;  Laterality: Right;  . Patch angioplasty Right 09/17/2013    Procedure: Vein Patch Angioplasty of Right Femoral  Artery;  Surgeon: Elam Dutch, MD;  Location: [place];  Service: Vascular;  Laterality: Right;  . Fasciotomy Right 09/17/2013    Procedure: Four Compartment Fasciotomy;  Surgeon: Elam Dutch, MD;  Location: [place];  Service: Vascular;  Laterality: Right;  . Fasciotomy closure Right 09/19/2013    Procedure: FASCIOTOMY CLOSURE;  Surgeon: Elam Dutch, MD;  Location: [place];  Service: Vascular;  Laterality: Right;  regional block and monitored anesthesia care used  . I&d extremity Right 09/28/2013    Procedure: IRRIGATION AND  DEBRIDEMENT EXTREMITY;  Surgeon: Serafina Mitchell, MD;  Location: [facility] OR;  Service: Vascular;  Laterality: Right;  . Application of wound vac Right 09/28/2013    Procedure: APPLICATION OF WOUND VAC;  Surgeon: Serafina Mitchell, MD;  Location: [facility] OR;  Service: Vascular;  Laterality: Right;   Family History  Problem Relation Age of Onset  . Coronary artery disease Father   . Asthma Father   . COPD Father   . Hypertension Father   . Hyperlipidemia Father   . Diabetes Father   . Congestive Heart Failure Father   . Heart disease Father     before age 20  . Heart attack Father   . Peripheral vascular disease Father   . Hypertension Mother   . Hyperlipidemia Mother   . Diabetes Mother   . Cancer Mother   . Cancer Maternal Aunt      three aunts, bone, breast, ?  . Hypertension Brother   . Diabetes Brother   . Diabetes Sister   . Colon cancer Neg Hx   . Celiac disease Neg Hx   . Crohn's disease Neg Hx   . Ulcerative colitis Neg Hx   . Lung cancer Maternal Grandmother    History  Substance Use Topics  . Smoking status: Former Smoker -- 2.00 packs/day for 30 years    Quit date: 08/01/2011  . Smokeless tobacco: Never Used  . Alcohol Use: No   OB History   Grav Para Term Preterm Abortions TAB SAB Ect Mult Living                 Review of Systems  Constitutional: Negative for fever, chills, diaphoresis, activity change, appetite change and fatigue.  HENT: Negative for congestion, facial swelling, rhinorrhea and sore throat.   Eyes: Negative for photophobia and discharge.  Respiratory: Negative for cough, chest tightness and shortness of breath.   Cardiovascular: Negative for chest pain, palpitations and leg swelling.  Gastrointestinal: Negative for nausea, vomiting, abdominal pain and diarrhea.  Endocrine: Negative for polydipsia and polyuria.  Genitourinary: Negative for dysuria, frequency, difficulty urinating and pelvic pain.  Musculoskeletal: Negative for arthralgias, back pain,  neck pain and neck stiffness.  Skin: Negative for color change and wound.  Allergic/Immunologic: Negative for immunocompromised state.  Neurological: Positive for numbness. Negative for facial asymmetry, weakness and headaches.  Hematological: Does not bruise/bleed easily.  Psychiatric/Behavioral: Negative for confusion and agitation.      Allergies  Crestor; Nsaids; and Ciprofloxacin  Home Medications   Prior to Admission medications   Medication Sig Start Date End Date Taking? Authorizing Provider  aspirin EC 81 MG EC tablet Take 1 tablet (81 mg total) by mouth daily. 08/17/13  Yes Belkys A Regalado, MD  atorvastatin (LIPITOR) 80 MG tablet Take 1 tablet (80 mg total) by mouth daily at 6 PM. 10/29/13  Yes Amy D Clegg, NP  calcium acetate (PHOSLO) 667 MG capsule Take 2 capsules (1,334 mg total)  by mouth 3 (three) times daily with meals. 11/10/13  Yes Hosie Poisson, MD  cyclobenzaprine (FLEXERIL) 10 MG tablet Take 10 mg by mouth 3 (three) times daily as needed for muscle spasms.   Yes Historical Provider, MD  Insulin Detemir (LEVEMIR FLEXPEN) 100 UNIT/ML Pen Inject 20 Units into the skin every evening. 10/29/13  Yes Amy D Clegg, NP  insulin lispro (HUMALOG KWIKPEN) 100 UNIT/ML KiwkPen Inject 0.05 mLs (5 Units total) into the skin 3 (three) times daily with meals. 10/29/13  Yes Amy D Clegg, NP  methylcellulose (ARTIFICIAL TEARS) 1 % ophthalmic solution Place 1 drop into both eyes at bedtime as needed (dry eyes).   Yes Historical Provider, MD  midodrine (PROAMATINE) 10 MG tablet Take 5 mg by mouth 3 (three) times a week. Monday, Wednesday and Friday   Yes Historical Provider, MD  multivitamin (RENA-VIT) TABS tablet Take 1 tablet by mouth daily.   Yes Historical Provider, MD  oxyCODONE-acetaminophen (ROXICET) 5-325 MG per tablet Take 1-2 tablets by mouth every 4 (four) hours as needed for severe pain. 01/03/14  Yes Angelia Mould, MD  pantoprazole (PROTONIX) 40 MG tablet Take 1 tablet (40 mg  total) by mouth daily. 10/29/13  Yes Amy D Clegg, NP  polyethylene glycol (MIRALAX / GLYCOLAX) packet Take 17 g by mouth daily as needed (constipation). 10/29/13  Yes Amy D Clegg, NP  promethazine (PHENERGAN) 25 MG tablet Take 1 tablet (25 mg total) by mouth every 8 (eight) hours as needed for nausea or vomiting. 12/11/13  Yes Susy Frizzle, MD  traMADol (ULTRAM) 50 MG tablet Take 1 tablet (50 mg total) by mouth every 6 (six) hours as needed for moderate pain. 11/29/13  Yes Ripudeep Krystal Eaton, MD  ursodiol (ACTIGALL) 300 MG capsule Take 1 capsule (300 mg total) by mouth 2 (two) times daily. 11/29/13  Yes Ripudeep Krystal Eaton, MD  warfarin (COUMADIN) 1 MG tablet Take 1 mg by mouth See admin instructions. Take 1mg  on Friday and Saturday. Sunday take 0.5mg .   Yes Historical Provider, MD   BP 119/76  Pulse 104  Temp(Src) 98.3 F (36.8 C) (Oral)  Resp 15  SpO2 91%  LMP 09/17/2013 Physical Exam  Constitutional: She is oriented to person, place, and time. She appears well-developed and well-nourished. No distress.  HENT:  Head: Normocephalic and atraumatic.  Mouth/Throat: No oropharyngeal exudate.  Eyes: Pupils are equal, round, and reactive to light.  Neck: Normal range of motion. Neck supple.  Cardiovascular: Normal rate, regular rhythm and normal heart sounds.  Exam reveals no gallop and no friction rub.   No murmur heard. Pulmonary/Chest: Effort normal and breath sounds normal. No respiratory distress. She has no wheezes. She has no rales.  Abdominal: Soft. Bowel sounds are normal. She exhibits no distension and no mass. There is no tenderness. There is no rebound and no guarding.  Musculoskeletal: Normal range of motion. She exhibits no edema.       Right hand: She exhibits tenderness and decreased capillary refill. Decreased sensation noted. Decreased sensation is present in the ulnar distribution, is present in the medial distribution and is present in the radial distribution. Normal strength noted.   Diminished radial/ulnar pulses. AV fistula site C/D/I. Thrill not palpable, but audible with stethoscope.   Neurological: She is alert and oriented to person, place, and time.  Skin: Skin is warm and dry.  Psychiatric: She has a normal mood and affect.    ED Course  Procedures (including critical care time) Labs Review  Labs Reviewed  GLUCOSE, CAPILLARY - Abnormal; Notable for the following:    Glucose-Capillary 187 (*)    All other components within normal limits  I-STAT CHEM 8, ED - Abnormal; Notable for the following:    Sodium 134 (*)    Creatinine, Ser 3.00 (*)    Glucose, Bld 176 (*)    Calcium, Ion 1.02 (*)    All other components within normal limits    Imaging Review No results found.   EKG Interpretation None      MDM   Final diagnoses:  ESRD (end stage renal disease) on dialysis  Steal syndrome as complication of dialysis access, initial encounter    Pt is a 43 y.o. female with Pmhx as above who presents with inc R distal arm pain/paresthesias since having AV fistula placed yesterday by Dr. Scot Dock w/ vasc surgery.  No infectious s/sx, no neuro symptoms elsewhere. She has otherwise been in usual state of health. On PE, VSS, pt in NAD. Surgical site C/D/I.  Thrill not palpable, but easily heard using stethoscope.  She has diminished radial/ulnar pulses, >5sec cap refill in fingers and R hand cooler than L hand. Motor intact, sensation reported as decreased compared to the left.  Spoke w/ vascular surgery who will see pt in the ED shortly.    Vascular surgery, Dr. Donnetta Hutching, concerned for steal syndrome and will take to the OR tonight for ligation. She will be d/c'd after procedure.          Ernestina Patches, MD 01/05/14 1006

## 2014-01-04 NOTE — Anesthesia Preprocedure Evaluation (Signed)
Anesthesia Evaluation  Patient identified by MRN, date of birth, ID band Patient awake    Reviewed: Allergy & Precautions, H&P , NPO status , Patient's Chart, lab work & pertinent test results, reviewed documented beta blocker date and time   Airway Mallampati: II TM Distance: >3 FB Neck ROM: full    Dental  (+) Poor Dentition, Dental Advidsory Given, Chipped   Pulmonary former smoker,          Cardiovascular hypertension, + CAD, + Past MI, + Peripheral Vascular Disease and +CHF     Neuro/Psych  Neuromuscular disease    GI/Hepatic GERD-  ,  Endo/Other  diabetes  Renal/GU ESRFRenal disease     Musculoskeletal   Abdominal   Peds  Hematology  (+) anemia ,   Anesthesia Other Findings Normal LV size with severe systolic dysfunction, EF 32-35%. Wall   motion abnormalities as noted above. Moderate diastolic   dysfunction with evidence for elevated LV filling pressure. The   RV was mildly dilated with moderate to severely decreased   systolic function. At least moderate MR, probably   infarct-related.   Reproductive/Obstetrics                           Anesthesia Physical Anesthesia Plan  ASA: III and emergent  Anesthesia Plan: MAC   Post-op Pain Management:    Induction:   Airway Management Planned: Simple Face Mask  Additional Equipment:   Intra-op Plan:   Post-operative Plan:   Informed Consent:   Plan Discussed with: CRNA, Anesthesiologist and Surgeon  Anesthesia Plan Comments:         Anesthesia Quick Evaluation

## 2014-01-04 NOTE — Telephone Encounter (Signed)
Her chart shows that she received Percocet yesterday, from Dr. Scot Dock, she cant have both norco and perocet

## 2014-01-04 NOTE — Anesthesia Procedure Notes (Signed)
Procedure Name: MAC Date/Time: 01/04/2014 9:00 PM Performed by: Claris Che Pre-anesthesia Checklist: Patient identified, Emergency Drugs available, Suction available, Patient being monitored and Timeout performed Patient Re-evaluated:Patient Re-evaluated prior to inductionOxygen Delivery Method: Simple face mask Placement Confirmation: positive ETCO2

## 2014-01-04 NOTE — Op Note (Signed)
    OPERATIVE REPORT  DATE OF SURGERY: 01/04/2014  PATIENT: Hannah [name], 43 y.o. female MRN: [id number]  DOB: Apr 22, 1970  PRE-OPERATIVE DIAGNOSIS: Steal syndrome right arm  POST-OPERATIVE DIAGNOSIS:  Same  PROCEDURE: Ligation of right upper arm AV fistula  SURGEON:  Curt Jews, M.D.  PHYSICIAN ASSISTANT: Nurse  ANESTHESIA:  Local with sedation  EBL: Minimal ml     BLOOD ADMINISTERED: None  DRAINS: None  SPECIMEN: None  COUNTS CORRECT:  YES  PLAN OF CARE: To PACU   PATIENT DISPOSITION:  PACU - hemodynamically stable  PROCEDURE DETAILS: Patient underwent placement of a right upper arm AV fistula with Dr. Scot Dock yesterday as an outpatient. She presented to the emergency room this evening with worsening numbness coolness and weakness in her right hand. She had no Doppler flow in the radial or ulnar artery with her fistula patent. Compression should return flow. Discussed this with the patient and her husband. Explained that she would require ligation for correction of her steal.  She was taken to the operative room placed supine position with the area of the right arm was prepped in usual sterile fashion. The incision was reopened. The cephalic vein was ligated just distal to the arteriovenous fistula. She did Doppler flow following this in her radial and ulnar arteries. Wounds were irrigated with saline saline. Hemostasis was obtained with electrocautery cautery. The patient was transferred to the recovery room in stable condition   Curt Jews, M.D. 01/04/2014 9:27 PM

## 2014-01-04 NOTE — Anesthesia Postprocedure Evaluation (Signed)
Anesthesia Post Note  Patient: Hannah [name]  Procedure(s) Performed: Procedure(s) (LRB): LIGATION OF ARTERIOVENOUS  FISTULA (Right)  Anesthesia type: MAC  Patient location: PACU  Post pain: Pain level controlled  Post assessment: Patient's Cardiovascular Status Stable  Last Vitals:  Filed Vitals:   01/04/14 2215  BP: 123/81  Pulse: 102  Temp:   Resp: 7    Post vital signs: Reviewed and stable  Level of consciousness: sedated  Complications: No apparent anesthesia complications

## 2014-01-04 NOTE — ED Notes (Signed)
Vascular MD at the bedside   

## 2014-01-04 NOTE — Progress Notes (Signed)
Patient ID: Hannah [name], female   DOB: 11-20-1970, 43 y.o.   MRN: [id number]     Patient name: Hannah [name] MRN: [id number] DOB: Dec 26, 1970 Sex: female   Referred by: EDP  Reason for referral:  Chief Complaint  Patient presents with  . Arm Pain  . Numbness    HISTORY OF PRESENT ILLNESS: Patient presents to emergency room today one day following right upper arm AV fistula creation with Dr. Scot Dock. In his op note he reports that she did have a small brachial artery. She did not have any numbness or discomfort in her hand following surgery. She reports that today she's had progressive difficulty with numbness and tingling and weakness in her right hand. She does have some soreness in the incision.  Past Medical History  Diagnosis Date  . High cholesterol   . Diabetic retinopathy   . Peripheral neuropathy     "tips of toes"  . Blind right eye   . CHF (congestive heart failure)   . CAD (coronary artery disease)   . GERD (gastroesophageal reflux disease)   . Hypertension   . History of lung cancer 07/2011    s/p left lower lobectomy  . Diabetes mellitus     IDDM  . Cataract of right eye   . Ulcer of toe of right foot 07/10/2012    great toe  . Breast calcification, left 06/2012  . Acute biphenotypic leukemia   . CKD (chronic kidney disease) stage 5, GFR less than 15 ml/min   . Nephrotic syndrome   . Gastritis     H/o gastritis on prior endoscopy  . Anemia   . Carotid artery disease   . Gallstones   . Myocardial infarction 5/16    Past Surgical History  Procedure Laterality Date  . Cardiac catheterization  07/16/2011  . Incision and drainage breast abscess Left   . Tubal ligation  1994  . Vitrectomy  2010    2 on left, 1 on right  . Cesarean section  1991; 1994  . Video assisted thoracoscopy (vats)/ lobectomy Left 07/30/2011    left main thoracotomy, left lower lobectomy, mediastinal lymph node dissection  . Lobectomy    . Colonoscopy with  esophagogastroduodenoscopy (egd) N/A 08/14/2012    Procedure: COLONOSCOPY WITH ESOPHAGOGASTRODUODENOSCOPY (EGD);  Surgeon: Danie Binder, MD;  [facility];  Service: Endoscopy;  Laterality: N/A;  10:45-moved to [address] to notify pt  . Breast lumpectomy with needle localization Left 11/14/2012    Procedure: BREAST LUMPECTOMY WITH NEEDLE LOCALIZATION;  Surgeon: Marcello Moores A. Cornett, MD;  Location: [place];  Service: General;  Laterality: Left;  . Insertion of dialysis catheter Left 09/12/2013    Procedure: INSERTION OF DIALYSIS CATHETER;  Surgeon: Mal Misty, MD;  Location: [place];  Service: Vascular;  Laterality: Left;  . Embolectomy Right 09/17/2013    Procedure: Thrombectomy of Right Common Femoral Artery;  Surgeon: Elam Dutch, MD;  Location: [facility] OR;  Service: Vascular;  Laterality: Right;  . Endarterectomy femoral Right 09/17/2013    Procedure: Right Femoral Endarterectomy;  Surgeon: Elam Dutch, MD;  Location: [facility] OR;  Service: Vascular;  Laterality: Right;  . Patch angioplasty Right 09/17/2013    Procedure: Vein Patch Angioplasty of Right Femoral Artery;  Surgeon: Elam Dutch, MD;  Location: [place];  Service: Vascular;  Laterality: Right;  . Fasciotomy Right 09/17/2013    Procedure: Four Compartment Fasciotomy;  Surgeon: Elam Dutch, MD;  Location: [place];  Service: Vascular;  Laterality: Right;  . Fasciotomy closure Right 09/19/2013    Procedure: FASCIOTOMY CLOSURE;  Surgeon: Elam Dutch, MD;  Location: [place];  Service: Vascular;  Laterality: Right;  regional block and monitored anesthesia care used  . I&d extremity Right 09/28/2013    Procedure: IRRIGATION AND DEBRIDEMENT EXTREMITY;  Surgeon: Serafina Mitchell, MD;  Location: [place];  Service: Vascular;  Laterality: Right;  . Application of wound vac Right 09/28/2013    Procedure: APPLICATION OF WOUND VAC;  Surgeon: Serafina Mitchell, MD;  Location: [place];  Service: Vascular;  Laterality: Right;    History   Social  History  . Marital Status: Married    Spouse Name: N/A    Number of Children: 3  . Years of Education: N/A   Occupational History  . Not on file.   Social History Main Topics  . Smoking status: Former Smoker -- 2.00 packs/day for 30 years    Quit date: 08/01/2011  . Smokeless tobacco: Never Used  . Alcohol Use: No  . Drug Use: No  . Sexual Activity: Yes   Other Topics Concern  . Not on file   Social History Narrative  . No narrative on file    Family History  Problem Relation Age of Onset  . Coronary artery disease Father   . Asthma Father   . COPD Father   . Hypertension Father   . Hyperlipidemia Father   . Diabetes Father   . Congestive Heart Failure Father   . Heart disease Father     before age 11  . Heart attack Father   . Peripheral vascular disease Father   . Hypertension Mother   . Hyperlipidemia Mother   . Diabetes Mother   . Cancer Mother   . Cancer Maternal Aunt      three aunts, bone, breast, ?  . Hypertension Brother   . Diabetes Brother   . Diabetes Sister   . Colon cancer Neg Hx   . Celiac disease Neg Hx   . Crohn's disease Neg Hx   . Ulcerative colitis Neg Hx   . Lung cancer Maternal Grandmother     Allergies as of 01/04/2014 - Review Complete 01/04/2014  Allergen Reaction Noted  . Crestor [rosuvastatin] Other (See Comments) 11/02/2012  . Nsaids Other (See Comments) 05/05/2013  . Ciprofloxacin Rash 05/31/2011    No current facility-administered medications on file prior to encounter.   Current Outpatient Prescriptions on File Prior to Encounter  Medication Sig Dispense Refill  . aspirin EC 81 MG EC tablet Take 1 tablet (81 mg total) by mouth daily.  30 tablet  0  . atorvastatin (LIPITOR) 80 MG tablet Take 1 tablet (80 mg total) by mouth daily at 6 PM.  30 tablet  6  . calcium acetate (PHOSLO) 667 MG capsule Take 2 capsules (1,334 mg total) by mouth 3 (three) times daily with meals.  90 capsule  0  . cyclobenzaprine (FLEXERIL) 10 MG  tablet Take 10 mg by mouth 3 (three) times daily as needed for muscle spasms.      . Insulin Detemir (LEVEMIR FLEXPEN) 100 UNIT/ML Pen Inject 20 Units into the skin every evening.  15 mL  11  . insulin lispro (HUMALOG KWIKPEN) 100 UNIT/ML KiwkPen Inject 0.05 mLs (5 Units total) into the skin 3 (three) times daily with meals.  15 mL  0  . methylcellulose (ARTIFICIAL TEARS) 1 % ophthalmic solution Place 1 drop into both eyes at bedtime  as needed (dry eyes).      . midodrine (PROAMATINE) 10 MG tablet Take 5 mg by mouth 3 (three) times a week. Monday, Wednesday and Friday      . multivitamin (RENA-VIT) TABS tablet Take 1 tablet by mouth daily.      Marland Kitchen oxyCODONE-acetaminophen (ROXICET) 5-325 MG per tablet Take 1-2 tablets by mouth every 4 (four) hours as needed for severe pain.  20 tablet  0  . pantoprazole (PROTONIX) 40 MG tablet Take 1 tablet (40 mg total) by mouth daily.  30 tablet  6  . polyethylene glycol (MIRALAX / GLYCOLAX) packet Take 17 g by mouth daily as needed (constipation).  14 each  6  . promethazine (PHENERGAN) 25 MG tablet Take 1 tablet (25 mg total) by mouth every 8 (eight) hours as needed for nausea or vomiting.  20 tablet  0  . traMADol (ULTRAM) 50 MG tablet Take 1 tablet (50 mg total) by mouth every 6 (six) hours as needed for moderate pain.  60 tablet  0  . ursodiol (ACTIGALL) 300 MG capsule Take 1 capsule (300 mg total) by mouth 2 (two) times daily.  60 capsule  4  . warfarin (COUMADIN) 1 MG tablet Take 1 mg by mouth See admin instructions. Take 1mg  on Friday and Saturday. Sunday take 0.5mg .           PHYSICAL EXAMINATION:  General: The patient is a well-nourished female, in no acute distress. Vital signs are BP 121/72  Pulse 108  Temp(Src) 98.2 F (36.8 C) (Oral)  Resp 17  SpO2 91%  LMP 09/17/2013 Pulmonary: There is a good air exchange   Neurologic: Diminished grip strength in right hand. Decreased sensory function Skin: There are no ulcer or rashes  noted. Psychiatric: The patient has normal affect. Cardiovascular: No palpable right radial or ulnar pulse.  Hand-held Doppler she had no signal at the ulnar or radial arteries at her wrist. With compression of her upper arm fistula she did have return of Doppler flow in the ulnar and radial artery  Impression and Plan:  Acute steel related to right upper arm AV fistula creation yesterday. Discussed this with the patient. Explained will need to return to the operating room tonight for ligation. She does have a left IJ hemodialysis catheter for dialysis.    Lenzie Sandler [facility] [place] Office: [phone]

## 2014-01-07 ENCOUNTER — Encounter (HOSPITAL_COMMUNITY): Payer: Self-pay | Admitting: Vascular Surgery

## 2014-01-07 ENCOUNTER — Telehealth: Payer: Self-pay | Admitting: Family Medicine

## 2014-01-07 ENCOUNTER — Telehealth: Payer: Self-pay | Admitting: Vascular Surgery

## 2014-01-07 MED ORDER — TRAMADOL HCL 50 MG PO TABS: 50.0000 mg | ORAL_TABLET | Freq: Four times a day (QID) | ORAL | Status: DC | PRN

## 2014-01-07 NOTE — Telephone Encounter (Signed)
Patient is calling to get refill on pain medication  [phone]

## 2014-01-07 NOTE — Telephone Encounter (Signed)
Medication called to pharmacy. 

## 2014-01-07 NOTE — Telephone Encounter (Signed)
Left message for pt to cb for appt, dpm

## 2014-01-07 NOTE — Telephone Encounter (Signed)
Ok to refill Tramadol??  Last office visit 12/18/2013.  Last refill 11/29/2013.

## 2014-01-07 NOTE — Telephone Encounter (Signed)
okay

## 2014-01-07 NOTE — Telephone Encounter (Signed)
Message copied by Gena Fray on Mon Jan 07, 2014  2:04 PM ------      Message from: [place], [place] K      Created: Mon Jan 07, 2014  9:35 AM      Regarding: Schedule                   ----- Message -----         From: Rosetta Posner, MD         Sent: 01/04/2014   9:29 PM           To: [facility] Charge Pool            The patient presented to the emergency department with stable in her right arm. Was status post AV fistula creation with Dr. Scot Dock yesterday. Was taken to the operating room and underwent ligation of her AV fistula. Needs an office visit in one to 2 weeks with Dr. Scot Dock for followup ------

## 2014-01-08 ENCOUNTER — Encounter (HOSPITAL_COMMUNITY): Payer: Self-pay | Admitting: Vascular Surgery

## 2014-01-08 ENCOUNTER — Telehealth: Payer: Self-pay | Admitting: Family Medicine

## 2014-01-08 MED ORDER — HYDROCODONE-ACETAMINOPHEN 5-325 MG PO TABS: 1.0000 | ORAL_TABLET | Freq: Three times a day (TID) | ORAL | Status: DC | PRN

## 2014-01-08 NOTE — Telephone Encounter (Signed)
Call pt she was given ultram and percocet, see what she is uisng and clarify when prescribed

## 2014-01-08 NOTE — Telephone Encounter (Signed)
Okay to refill hydrocodone, change to 1 tablet TID #90

## 2014-01-08 NOTE — Telephone Encounter (Signed)
Prescription printed.   Call placed to patient to make aware. Line noted busy.

## 2014-01-08 NOTE — OR Nursing (Signed)
LATE ENTRY: Case end time changed from 2222 to 2122

## 2014-01-08 NOTE — Telephone Encounter (Signed)
Call placed to patient.   Reports the following: Hydrocodone/ APAP- last refilled by MD on 12/18/2013, #60 (states that she is taking 3-4x QD).  Oxycodone/ APAP- last filled by ER MD on 01/03/2014, #20 (states that she is taking 3x QD). Tramadol- last refilled by MD on 01/07/2014, 11/29/2013, #60 (states that she is not taking d/t meds are ineffective).  Reports that she has wound care dressing changes BID that she really needs to have pain management for, and she usually requires 1 if not 2 more times of pain medication daily (QID).   MD please advise.

## 2014-01-08 NOTE — Telephone Encounter (Signed)
Medication is not listed under active medications.   Noted that medication has been prescribed in past (12/18/2013).  MD please advise.

## 2014-01-08 NOTE — Telephone Encounter (Signed)
Patient is calling to get rx for her hydrocodone  Call when ready  [phone]

## 2014-01-09 NOTE — Telephone Encounter (Signed)
Patient returned call and made aware.

## 2014-01-09 NOTE — Telephone Encounter (Signed)
Call placed to patient. LMTRC.  

## 2014-01-10 ENCOUNTER — Ambulatory Visit (INDEPENDENT_AMBULATORY_CARE_PROVIDER_SITE_OTHER): Payer: Medicare Other | Admitting: Family Medicine

## 2014-01-10 ENCOUNTER — Encounter: Payer: Self-pay | Admitting: Family Medicine

## 2014-01-10 VITALS — BP 122/78 | HR 76 | Temp 97.6°F | Resp 14

## 2014-01-10 DIAGNOSIS — I2510 Atherosclerotic heart disease of native coronary artery without angina pectoris: Secondary | ICD-10-CM

## 2014-01-10 DIAGNOSIS — L02419 Cutaneous abscess of limb, unspecified: Secondary | ICD-10-CM

## 2014-01-10 DIAGNOSIS — L03119 Cellulitis of unspecified part of limb: Principal | ICD-10-CM

## 2014-01-10 MED ORDER — CEPHALEXIN 500 MG PO CAPS: 500.0000 mg | ORAL_CAPSULE | Freq: Four times a day (QID) | ORAL | Status: DC

## 2014-01-10 NOTE — Progress Notes (Signed)
Subjective:    Patient ID: Hannah [name], female    DOB: November 19, 1970, 43 y.o.   MRN: [id number]  HPI Patient is a very pleasant Caucasian 43 year old female with a complicated past medical history including cardiovascular disease, peripheral vascular disease, insulin-dependent diabetes mellitus, and hemodialysis dependent renal failure who presents with sudden onset of erythema in both legs beginning yesterday. The entire leg distal to the knee is extremely erythematous, warm, and tender to the touch.  Patient has 2 previous fasciotomy wounds on her right leg both medially and laterally. There is no purulence around the wounds. There is no obvious abscess formation. However the patient appears to be developing cellulitis in both legs. Past Medical History  Diagnosis Date  . High cholesterol   . Diabetic retinopathy   . Peripheral neuropathy     "tips of toes"  . Blind right eye   . CHF (congestive heart failure)   . CAD (coronary artery disease)   . GERD (gastroesophageal reflux disease)   . Hypertension   . History of lung cancer 07/2011    s/p left lower lobectomy  . Diabetes mellitus     IDDM  . Cataract of right eye   . Ulcer of toe of right foot 07/10/2012    great toe  . Breast calcification, left 06/2012  . Acute biphenotypic leukemia   . CKD (chronic kidney disease) stage 5, GFR less than 15 ml/min   . Nephrotic syndrome   . Gastritis     H/o gastritis on prior endoscopy  . Anemia   . Carotid artery disease   . Gallstones   . Myocardial infarction 5/16   Past Surgical History  Procedure Laterality Date  . Cardiac catheterization  07/16/2011  . Incision and drainage breast abscess Left   . Tubal ligation  1994  . Vitrectomy  2010    2 on left, 1 on right  . Cesarean section  1991; 1994  . Video assisted thoracoscopy (vats)/ lobectomy Left 07/30/2011    left main thoracotomy, left lower lobectomy, mediastinal lymph node dissection  . Lobectomy    . Colonoscopy with  esophagogastroduodenoscopy (egd) N/A 08/14/2012    Procedure: COLONOSCOPY WITH ESOPHAGOGASTRODUODENOSCOPY (EGD);  Surgeon: Danie Binder, MD;  [facility];  Service: Endoscopy;  Laterality: N/A;  10:45-moved to [address] to notify pt  . Breast lumpectomy with needle localization Left 11/14/2012    Procedure: BREAST LUMPECTOMY WITH NEEDLE LOCALIZATION;  Surgeon: Marcello Moores A. Cornett, MD;  Location: [place];  Service: General;  Laterality: Left;  . Insertion of dialysis catheter Left 09/12/2013    Procedure: INSERTION OF DIALYSIS CATHETER;  Surgeon: Mal Misty, MD;  Location: [place];  Service: Vascular;  Laterality: Left;  . Embolectomy Right 09/17/2013    Procedure: Thrombectomy of Right Common Femoral Artery;  Surgeon: Elam Dutch, MD;  Location: [facility] OR;  Service: Vascular;  Laterality: Right;  . Endarterectomy femoral Right 09/17/2013    Procedure: Right Femoral Endarterectomy;  Surgeon: Elam Dutch, MD;  Location: [facility] OR;  Service: Vascular;  Laterality: Right;  . Patch angioplasty Right 09/17/2013    Procedure: Vein Patch Angioplasty of Right Femoral Artery;  Surgeon: Elam Dutch, MD;  Location: [place];  Service: Vascular;  Laterality: Right;  . Fasciotomy Right 09/17/2013    Procedure: Four Compartment Fasciotomy;  Surgeon: Elam Dutch, MD;  Location: [place];  Service: Vascular;  Laterality: Right;  . Fasciotomy closure Right 09/19/2013  Procedure: FASCIOTOMY CLOSURE;  Surgeon: Elam Dutch, MD;  Location: [facility] OR;  Service: Vascular;  Laterality: Right;  regional block and monitored anesthesia care used  . I&d extremity Right 09/28/2013    Procedure: IRRIGATION AND DEBRIDEMENT EXTREMITY;  Surgeon: Serafina Mitchell, MD;  Location: [place];  Service: Vascular;  Laterality: Right;  . Application of wound vac Right 09/28/2013    Procedure: APPLICATION OF WOUND VAC;  Surgeon: Serafina Mitchell, MD;  Location: [place];  Service: Vascular;  Laterality: Right;  . Av fistula placement  Right 01/03/2014    Procedure: ARTERIOVENOUS (AV) FISTULA CREATION;  Surgeon: Angelia Mould, MD;  Location: [place];  Service: Vascular;  Laterality: Right;  . Ligation of arteriovenous  fistula Right 01/04/2014    Procedure: LIGATION OF ARTERIOVENOUS  FISTULA;  Surgeon: Rosetta Posner, MD;  Location: [facility] OR;  Service: Vascular;  Laterality: Right;   Current Outpatient Prescriptions on File Prior to Visit  Medication Sig Dispense Refill  . aspirin EC 81 MG EC tablet Take 1 tablet (81 mg total) by mouth daily.  30 tablet  0  . atorvastatin (LIPITOR) 80 MG tablet Take 1 tablet (80 mg total) by mouth daily at 6 PM.  30 tablet  6  . calcium acetate (PHOSLO) 667 MG capsule Take 2 capsules (1,334 mg total) by mouth 3 (three) times daily with meals.  90 capsule  0  . cyclobenzaprine (FLEXERIL) 10 MG tablet Take 10 mg by mouth 3 (three) times daily as needed for muscle spasms.      Marland Kitchen HYDROcodone-acetaminophen (NORCO) 5-325 MG per tablet Take 1 tablet by mouth 3 (three) times daily as needed for moderate pain.  90 tablet  0  . Insulin Detemir (LEVEMIR FLEXPEN) 100 UNIT/ML Pen Inject 20 Units into the skin every evening.  15 mL  11  . insulin lispro (HUMALOG KWIKPEN) 100 UNIT/ML KiwkPen Inject 0.05 mLs (5 Units total) into the skin 3 (three) times daily with meals.  15 mL  0  . methylcellulose (ARTIFICIAL TEARS) 1 % ophthalmic solution Place 1 drop into both eyes at bedtime as needed (dry eyes).      . midodrine (PROAMATINE) 10 MG tablet Take 5 mg by mouth 3 (three) times a week. Monday, Wednesday and Friday      . multivitamin (RENA-VIT) TABS tablet Take 1 tablet by mouth daily.      . pantoprazole (PROTONIX) 40 MG tablet Take 1 tablet (40 mg total) by mouth daily.  30 tablet  6  . polyethylene glycol (MIRALAX / GLYCOLAX) packet Take 17 g by mouth daily as needed (constipation).  14 each  6  . promethazine (PHENERGAN) 25 MG tablet Take 1 tablet (25 mg total) by mouth every 8 (eight) hours as needed for  nausea or vomiting.  20 tablet  0  . traMADol (ULTRAM) 50 MG tablet Take 1 tablet (50 mg total) by mouth every 6 (six) hours as needed for moderate pain.  60 tablet  0  . ursodiol (ACTIGALL) 300 MG capsule Take 1 capsule (300 mg total) by mouth 2 (two) times daily.  60 capsule  4   No current facility-administered medications on file prior to visit.   Allergies  Allergen Reactions  . Crestor [Rosuvastatin] Other (See Comments)    Severe muscle weakness  . Nsaids Other (See Comments)    Not allergic, "bad on my kidneys"  . Ciprofloxacin Rash   History   Social History  . Marital Status: Married  Spouse Name: N/A    Number of Children: 3  . Years of Education: N/A   Occupational History  . Not on file.   Social History Main Topics  . Smoking status: Former Smoker -- 2.00 packs/day for 30 years    Quit date: 08/01/2011  . Smokeless tobacco: Never Used  . Alcohol Use: No  . Drug Use: No  . Sexual Activity: Yes   Other Topics Concern  . Not on file   Social History Narrative  . No narrative on file      Review of Systems  All other systems reviewed and are negative.      Objective:   Physical Exam  Vitals reviewed. Cardiovascular: Normal rate and regular rhythm.   No murmur heard. Pulmonary/Chest: Effort normal and breath sounds normal.  Skin: Skin is warm. There is erythema.   both legs distal to the knees are erythematous, hot, and tender to the touch. Erythema extends circumferentially around the leg. I examined both fasciotomy sites and see no evidence of abscess formation nor purulent drainage coming from the fasciotomy sites.        Assessment & Plan:  Cellulitis and abscess of leg - Plan: cephALEXin (KEFLEX) 500 MG capsule  currently the patient is developing cellulitis bilaterally. I will start her on Keflex 500 mg by mouth 4 times a day for 7 days. I will have the patient return Monday for recheck or immediately if it is getting worse. Patient has  no history of MRSA infections per her report.

## 2014-01-11 ENCOUNTER — Telehealth: Payer: Self-pay

## 2014-01-11 NOTE — Telephone Encounter (Signed)
Discussed pt's. Symptoms with Dr. Scot Dock.  Recommended to have pt. Continue to monitor, and call if worsens; also to schedule duplex of AVF and office evaluation.  Notified pt.  Verb. Understanding; agrees with plan.

## 2014-01-11 NOTE — Telephone Encounter (Signed)
Pt. called to report a knot on right antecubital area; describes as being raised approx.size of a grape, and approx. Half dollar size in diameter.  Stated she can feel it more than she can see the raised area.  Stated it is somewhat tender.  Denies general swelling in the hand or arm.  Reported she first noticed the raised area on Wednesday.  Denied any change in size from first detecting it.  Advised to continue to monitor, and to notify office, if increase in size.  Advised to go the the ER if rapid change in size or bleeding.  Verb. understanding.  Will make Dr. Scot Dock aware.

## 2014-01-14 ENCOUNTER — Ambulatory Visit: Payer: Medicare Other | Admitting: Family Medicine

## 2014-01-14 ENCOUNTER — Ambulatory Visit: Payer: Medicare Other | Admitting: Family

## 2014-01-14 ENCOUNTER — Other Ambulatory Visit (HOSPITAL_COMMUNITY): Payer: Medicare Other

## 2014-01-15 ENCOUNTER — Encounter (HOSPITAL_COMMUNITY): Payer: Medicare Other

## 2014-01-17 ENCOUNTER — Telehealth (HOSPITAL_COMMUNITY): Payer: Self-pay | Admitting: Vascular Surgery

## 2014-01-19 ENCOUNTER — Other Ambulatory Visit (HOSPITAL_COMMUNITY): Payer: Self-pay | Admitting: Adult Health

## 2014-01-19 ENCOUNTER — Other Ambulatory Visit: Payer: Self-pay | Admitting: Family Medicine

## 2014-01-22 ENCOUNTER — Other Ambulatory Visit (HOSPITAL_COMMUNITY): Payer: Medicare Other

## 2014-01-22 ENCOUNTER — Encounter: Payer: Self-pay | Admitting: Vascular Surgery

## 2014-01-22 NOTE — Telephone Encounter (Signed)
OK refill?? 

## 2014-01-23 ENCOUNTER — Encounter: Payer: Medicare Other | Admitting: Vascular Surgery

## 2014-01-24 ENCOUNTER — Ambulatory Visit (INDEPENDENT_AMBULATORY_CARE_PROVIDER_SITE_OTHER): Payer: Medicare Other | Admitting: Vascular Surgery

## 2014-01-24 ENCOUNTER — Ambulatory Visit (HOSPITAL_COMMUNITY)
Admission: RE | Admit: 2014-01-24 | Discharge: 2014-01-24 | Disposition: A | Discharge status: Home / Self Care | Payer: Medicare Other | Source: Ambulatory Visit | Attending: Vascular Surgery | Admitting: Vascular Surgery

## 2014-01-24 ENCOUNTER — Encounter: Payer: Self-pay | Admitting: Vascular Surgery

## 2014-01-24 ENCOUNTER — Other Ambulatory Visit: Payer: Self-pay | Admitting: Vascular Surgery

## 2014-01-24 VITALS — BP 145/101 | HR 103 | Resp 18 | Ht 67.0 in | Wt 258.0 lb

## 2014-01-24 DIAGNOSIS — N186 End stage renal disease: Secondary | ICD-10-CM | POA: Insufficient documentation

## 2014-01-24 DIAGNOSIS — I2510 Atherosclerotic heart disease of native coronary artery without angina pectoris: Secondary | ICD-10-CM

## 2014-01-24 DIAGNOSIS — Z4931 Encounter for adequacy testing for hemodialysis: Secondary | ICD-10-CM

## 2014-01-24 DIAGNOSIS — M79601 Pain in right arm: Secondary | ICD-10-CM | POA: Insufficient documentation

## 2014-01-24 NOTE — Progress Notes (Signed)
  [facility] [place] Progress Note  CC:  Numbness right fingertips Clare, Modena Nunnery, MD  HPI: This is a 43 y.o. female who presents to the office for numbness of her right fingertips. She is s/p right upper arm AVF fistula ligation on 01/04/14 due to progressive numbness and tingling of her right hand. She had the fistula placed one day prior on 01/03/14 by Dr. Scot Dock. She states that after surgery the numbness and tingling of her hand began to improve but she still has numbness at her fingertips. She denies any pain or weakness in her hand and is able to perform usual daily activities with her right hand. She denies any previous numbness in her right hand before surgery. She also denies any numbness of her left hand.  She currently dialyzes through a tunneled dialysis catheter on MWF.   PHYSICAL EXAMINATION:  Filed Vitals:   01/24/14 1248  BP: 145/101  Pulse: 103  Resp: 18   Body mass index is 40.4 kg/(m^2).  General:  WD obese female in NAD Pulmonary: normal non-labored breathing without rales, rhonchi, and wheezing Cardiac: RRR, without  Murmurs, rubs or gallops  Vascular Exam/Pulses:non palpable right radial and ulnar pulses.  Extremities: 5/5 strength upper extremities bilaterally. Sensation intact in hands bilaterally.   Non-Invasive Vascular Imaging:   Right upper extremity duplex shows some narrowing of right subclavian artery.   ASSESSMENT/PLAN: 43 y.o. female s/p ligation of right upper arm AVF due to steal syndrome. Her hand numbness has improved since ligation of her fistula but still has numbness in her fingers. 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 She denies any weakness or pain in her hand. She will follow up in 3 weeks to see whether her symptoms have improved. If the symptoms remain, she may require a right upper extremity arteriogram for better  evaluation of her arterial stenosis.    Virgina Jock, PA-C [facility] [phone]  Clinic MD:  Pt seen and examined in conjunction with Dr. Oneida Alar   History and exam details as above. Most likely the patient's current symptoms are related to her continuing recovery from her steal syndrome. Initially her hand was completely non-now she just has numbness in the tips of her fingers. 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 We will give her a few more weeks to recover from this. If her symptoms of not totally resolve we will consider that point whether or not an arteriogram would be in her best interest. In light of her significant cardiac history if her symptoms are fairly minimal overall the best pathway is probably going to be conservative in nature. If she requires arteriogram we will have her see her primary vascular surgeon Dr. Trula Slade who has her scheduled for a carotid stent in the near future.  Ruta Hinds, MD [facility] [place] Office: [phone] Pager: [phone]

## 2014-01-25 ENCOUNTER — Other Ambulatory Visit (HOSPITAL_COMMUNITY): Payer: Self-pay | Admitting: Adult Health

## 2014-01-26 ENCOUNTER — Other Ambulatory Visit: Payer: Self-pay | Admitting: *Deleted

## 2014-01-26 MED ORDER — INSULIN LISPRO 100 UNIT/ML (KWIKPEN): 5.0000 [IU] | PEN_INJECTOR | Freq: Three times a day (TID) | SUBCUTANEOUS | Status: DC

## 2014-01-26 NOTE — Telephone Encounter (Signed)
Received fax requesting refill on Humalog.   Refill appropriate and filled per protocol.

## 2014-01-28 ENCOUNTER — Encounter (HOSPITAL_COMMUNITY): Payer: Self-pay | Admitting: Emergency Medicine

## 2014-01-28 DIAGNOSIS — E10319 Type 1 diabetes mellitus with unspecified diabetic retinopathy without macular edema: Secondary | ICD-10-CM | POA: Diagnosis present

## 2014-01-28 DIAGNOSIS — Z888 Allergy status to other drugs, medicaments and biological substances status: Secondary | ICD-10-CM

## 2014-01-28 DIAGNOSIS — E785 Hyperlipidemia, unspecified: Secondary | ICD-10-CM | POA: Diagnosis present

## 2014-01-28 DIAGNOSIS — Z8674 Personal history of sudden cardiac arrest: Secondary | ICD-10-CM

## 2014-01-28 DIAGNOSIS — K219 Gastro-esophageal reflux disease without esophagitis: Secondary | ICD-10-CM | POA: Diagnosis present

## 2014-01-28 DIAGNOSIS — E1065 Type 1 diabetes mellitus with hyperglycemia: Secondary | ICD-10-CM | POA: Diagnosis present

## 2014-01-28 DIAGNOSIS — Z856 Personal history of leukemia: Secondary | ICD-10-CM

## 2014-01-28 DIAGNOSIS — I252 Old myocardial infarction: Secondary | ICD-10-CM

## 2014-01-28 DIAGNOSIS — I2510 Atherosclerotic heart disease of native coronary artery without angina pectoris: Secondary | ICD-10-CM | POA: Diagnosis present

## 2014-01-28 DIAGNOSIS — I6523 Occlusion and stenosis of bilateral carotid arteries: Secondary | ICD-10-CM | POA: Diagnosis present

## 2014-01-28 DIAGNOSIS — I509 Heart failure, unspecified: Secondary | ICD-10-CM | POA: Diagnosis present

## 2014-01-28 DIAGNOSIS — Z794 Long term (current) use of insulin: Secondary | ICD-10-CM

## 2014-01-28 DIAGNOSIS — Z7982 Long term (current) use of aspirin: Secondary | ICD-10-CM

## 2014-01-28 DIAGNOSIS — Z85118 Personal history of other malignant neoplasm of bronchus and lung: Secondary | ICD-10-CM

## 2014-01-28 DIAGNOSIS — D631 Anemia in chronic kidney disease: Secondary | ICD-10-CM | POA: Diagnosis present

## 2014-01-28 DIAGNOSIS — Z87891 Personal history of nicotine dependence: Secondary | ICD-10-CM

## 2014-01-28 DIAGNOSIS — R1032 Left lower quadrant pain: Secondary | ICD-10-CM | POA: Diagnosis not present

## 2014-01-28 DIAGNOSIS — Z792 Long term (current) use of antibiotics: Secondary | ICD-10-CM

## 2014-01-28 DIAGNOSIS — Z992 Dependence on renal dialysis: Secondary | ICD-10-CM

## 2014-01-28 DIAGNOSIS — K5900 Constipation, unspecified: Secondary | ICD-10-CM | POA: Diagnosis present

## 2014-01-28 DIAGNOSIS — H5441 Blindness, right eye, normal vision left eye: Secondary | ICD-10-CM | POA: Diagnosis present

## 2014-01-28 DIAGNOSIS — I120 Hypertensive chronic kidney disease with stage 5 chronic kidney disease or end stage renal disease: Secondary | ICD-10-CM | POA: Diagnosis present

## 2014-01-28 DIAGNOSIS — N2581 Secondary hyperparathyroidism of renal origin: Secondary | ICD-10-CM | POA: Diagnosis present

## 2014-01-28 DIAGNOSIS — L03311 Cellulitis of abdominal wall: Secondary | ICD-10-CM | POA: Diagnosis not present

## 2014-01-28 DIAGNOSIS — Z86718 Personal history of other venous thrombosis and embolism: Secondary | ICD-10-CM

## 2014-01-28 DIAGNOSIS — N186 End stage renal disease: Secondary | ICD-10-CM | POA: Diagnosis present

## 2014-01-28 DIAGNOSIS — I255 Ischemic cardiomyopathy: Secondary | ICD-10-CM | POA: Diagnosis present

## 2014-01-28 DIAGNOSIS — E889 Metabolic disorder, unspecified: Secondary | ICD-10-CM | POA: Diagnosis present

## 2014-01-28 DIAGNOSIS — I748 Embolism and thrombosis of other arteries: Secondary | ICD-10-CM | POA: Diagnosis present

## 2014-01-28 DIAGNOSIS — H269 Unspecified cataract: Secondary | ICD-10-CM | POA: Diagnosis present

## 2014-01-28 DIAGNOSIS — Z87441 Personal history of nephrotic syndrome: Secondary | ICD-10-CM

## 2014-01-28 DIAGNOSIS — Z881 Allergy status to other antibiotic agents status: Secondary | ICD-10-CM

## 2014-01-28 LAB — CBC WITH DIFFERENTIAL/PLATELET
BASOS ABS: 0.1 10*3/uL (ref 0.0–0.1)
Basophils Relative: 1 % (ref 0–1)
Eosinophils Absolute: 0.2 10*3/uL (ref 0.0–0.7)
Eosinophils Relative: 3 % (ref 0–5)
HCT: 35.3 % — ABNORMAL LOW (ref 36.0–46.0)
Hemoglobin: 11 g/dL — ABNORMAL LOW (ref 12.0–15.0)
LYMPHS PCT: 9 % — AB (ref 12–46)
Lymphs Abs: 0.6 10*3/uL — ABNORMAL LOW (ref 0.7–4.0)
MCH: 30 pg (ref 26.0–34.0)
MCHC: 31.2 g/dL (ref 30.0–36.0)
MCV: 96.2 fL (ref 78.0–100.0)
Monocytes Absolute: 0.4 10*3/uL (ref 0.1–1.0)
Monocytes Relative: 5 % (ref 3–12)
Neutro Abs: 6 10*3/uL (ref 1.7–7.7)
Neutrophils Relative %: 82 % — ABNORMAL HIGH (ref 43–77)
PLATELETS: 226 10*3/uL (ref 150–400)
RBC: 3.67 MIL/uL — ABNORMAL LOW (ref 3.87–5.11)
RDW: 19.3 % — ABNORMAL HIGH (ref 11.5–15.5)
WBC: 7.2 10*3/uL (ref 4.0–10.5)

## 2014-01-28 LAB — COMPREHENSIVE METABOLIC PANEL
ALT: 16 U/L (ref 0–35)
AST: 22 U/L (ref 0–37)
Albumin: 2.7 g/dL — ABNORMAL LOW (ref 3.5–5.2)
Alkaline Phosphatase: 123 U/L — ABNORMAL HIGH (ref 39–117)
Anion gap: 17 — ABNORMAL HIGH (ref 5–15)
BUN: 38 mg/dL — ABNORMAL HIGH (ref 6–23)
CALCIUM: 8.5 mg/dL (ref 8.4–10.5)
CO2: 29 meq/L (ref 19–32)
Chloride: 89 mEq/L — ABNORMAL LOW (ref 96–112)
Creatinine, Ser: 3.9 mg/dL — ABNORMAL HIGH (ref 0.50–1.10)
GFR calc Af Amer: 15 mL/min — ABNORMAL LOW (ref >90)
GFR calc non Af Amer: 13 mL/min — ABNORMAL LOW (ref >90)
Glucose, Bld: 309 mg/dL — ABNORMAL HIGH (ref 70–99)
Potassium: 4 mEq/L (ref 3.7–5.3)
SODIUM: 135 meq/L — AB (ref 137–147)
Total Bilirubin: 0.7 mg/dL (ref 0.3–1.2)
Total Protein: 6.3 g/dL (ref 6.0–8.3)

## 2014-01-28 NOTE — ED Notes (Signed)
Pt. reports low abdominal pain with nausea onset 2 days ago , denies emesis or diarrhea , no dysuria /fever or chills. Hemodialysis q Mon/Wed/Fri .

## 2014-01-29 ENCOUNTER — Emergency Department (HOSPITAL_COMMUNITY): Payer: Medicare Other

## 2014-01-29 ENCOUNTER — Encounter (HOSPITAL_COMMUNITY): Payer: Self-pay | Admitting: Radiology

## 2014-01-29 ENCOUNTER — Inpatient Hospital Stay (HOSPITAL_COMMUNITY)
Admission: EM | Admit: 2014-01-29 | Discharge: 2014-01-30 | DRG: 602 | Disposition: A | Discharge status: Home / Self Care | Payer: Medicare Other | Attending: Internal Medicine | Admitting: Internal Medicine

## 2014-01-29 DIAGNOSIS — N186 End stage renal disease: Secondary | ICD-10-CM

## 2014-01-29 DIAGNOSIS — Z87441 Personal history of nephrotic syndrome: Secondary | ICD-10-CM | POA: Diagnosis not present

## 2014-01-29 DIAGNOSIS — Z794 Long term (current) use of insulin: Secondary | ICD-10-CM | POA: Diagnosis not present

## 2014-01-29 DIAGNOSIS — E785 Hyperlipidemia, unspecified: Secondary | ICD-10-CM | POA: Diagnosis present

## 2014-01-29 DIAGNOSIS — H269 Unspecified cataract: Secondary | ICD-10-CM | POA: Diagnosis present

## 2014-01-29 DIAGNOSIS — I82A11 Acute embolism and thrombosis of right axillary vein: Secondary | ICD-10-CM

## 2014-01-29 DIAGNOSIS — K219 Gastro-esophageal reflux disease without esophagitis: Secondary | ICD-10-CM | POA: Diagnosis present

## 2014-01-29 DIAGNOSIS — D649 Anemia, unspecified: Secondary | ICD-10-CM

## 2014-01-29 DIAGNOSIS — Z992 Dependence on renal dialysis: Secondary | ICD-10-CM

## 2014-01-29 DIAGNOSIS — F17201 Nicotine dependence, unspecified, in remission: Secondary | ICD-10-CM

## 2014-01-29 DIAGNOSIS — L0390 Cellulitis, unspecified: Secondary | ICD-10-CM | POA: Diagnosis present

## 2014-01-29 DIAGNOSIS — N2581 Secondary hyperparathyroidism of renal origin: Secondary | ICD-10-CM | POA: Diagnosis present

## 2014-01-29 DIAGNOSIS — D631 Anemia in chronic kidney disease: Secondary | ICD-10-CM | POA: Diagnosis present

## 2014-01-29 DIAGNOSIS — K3184 Gastroparesis: Secondary | ICD-10-CM

## 2014-01-29 DIAGNOSIS — R739 Hyperglycemia, unspecified: Secondary | ICD-10-CM

## 2014-01-29 DIAGNOSIS — E1165 Type 2 diabetes mellitus with hyperglycemia: Secondary | ICD-10-CM

## 2014-01-29 DIAGNOSIS — D638 Anemia in other chronic diseases classified elsewhere: Secondary | ICD-10-CM

## 2014-01-29 DIAGNOSIS — I6523 Occlusion and stenosis of bilateral carotid arteries: Secondary | ICD-10-CM | POA: Diagnosis present

## 2014-01-29 DIAGNOSIS — K5900 Constipation, unspecified: Secondary | ICD-10-CM | POA: Diagnosis present

## 2014-01-29 DIAGNOSIS — Z888 Allergy status to other drugs, medicaments and biological substances status: Secondary | ICD-10-CM | POA: Diagnosis not present

## 2014-01-29 DIAGNOSIS — Z87891 Personal history of nicotine dependence: Secondary | ICD-10-CM | POA: Diagnosis not present

## 2014-01-29 DIAGNOSIS — I255 Ischemic cardiomyopathy: Secondary | ICD-10-CM | POA: Diagnosis present

## 2014-01-29 DIAGNOSIS — E10319 Type 1 diabetes mellitus with unspecified diabetic retinopathy without macular edema: Secondary | ICD-10-CM | POA: Diagnosis present

## 2014-01-29 DIAGNOSIS — I472 Ventricular tachycardia, unspecified: Secondary | ICD-10-CM

## 2014-01-29 DIAGNOSIS — M79601 Pain in right arm: Secondary | ICD-10-CM

## 2014-01-29 DIAGNOSIS — L03115 Cellulitis of right lower limb: Secondary | ICD-10-CM

## 2014-01-29 DIAGNOSIS — Z5181 Encounter for therapeutic drug level monitoring: Secondary | ICD-10-CM

## 2014-01-29 DIAGNOSIS — IMO0002 Reserved for concepts with insufficient information to code with codable children: Secondary | ICD-10-CM | POA: Diagnosis present

## 2014-01-29 DIAGNOSIS — L03311 Cellulitis of abdominal wall: Secondary | ICD-10-CM | POA: Diagnosis present

## 2014-01-29 DIAGNOSIS — I748 Embolism and thrombosis of other arteries: Secondary | ICD-10-CM | POA: Diagnosis present

## 2014-01-29 DIAGNOSIS — I509 Heart failure, unspecified: Secondary | ICD-10-CM | POA: Diagnosis present

## 2014-01-29 DIAGNOSIS — G253 Myoclonus: Secondary | ICD-10-CM

## 2014-01-29 DIAGNOSIS — I5042 Chronic combined systolic (congestive) and diastolic (congestive) heart failure: Secondary | ICD-10-CM

## 2014-01-29 DIAGNOSIS — I2510 Atherosclerotic heart disease of native coronary artery without angina pectoris: Secondary | ICD-10-CM | POA: Diagnosis present

## 2014-01-29 DIAGNOSIS — Z7982 Long term (current) use of aspirin: Secondary | ICD-10-CM | POA: Diagnosis not present

## 2014-01-29 DIAGNOSIS — H5441 Blindness, right eye, normal vision left eye: Secondary | ICD-10-CM | POA: Diagnosis present

## 2014-01-29 DIAGNOSIS — K8020 Calculus of gallbladder without cholecystitis without obstruction: Secondary | ICD-10-CM

## 2014-01-29 DIAGNOSIS — E1065 Type 1 diabetes mellitus with hyperglycemia: Secondary | ICD-10-CM | POA: Diagnosis present

## 2014-01-29 DIAGNOSIS — Z86718 Personal history of other venous thrombosis and embolism: Secondary | ICD-10-CM | POA: Diagnosis not present

## 2014-01-29 DIAGNOSIS — I872 Venous insufficiency (chronic) (peripheral): Secondary | ICD-10-CM

## 2014-01-29 DIAGNOSIS — R109 Unspecified abdominal pain: Secondary | ICD-10-CM | POA: Diagnosis present

## 2014-01-29 DIAGNOSIS — Z856 Personal history of leukemia: Secondary | ICD-10-CM | POA: Diagnosis not present

## 2014-01-29 DIAGNOSIS — I10 Essential (primary) hypertension: Secondary | ICD-10-CM

## 2014-01-29 DIAGNOSIS — R1032 Left lower quadrant pain: Secondary | ICD-10-CM | POA: Diagnosis present

## 2014-01-29 DIAGNOSIS — I252 Old myocardial infarction: Secondary | ICD-10-CM | POA: Diagnosis not present

## 2014-01-29 DIAGNOSIS — Z85118 Personal history of other malignant neoplasm of bronchus and lung: Secondary | ICD-10-CM | POA: Diagnosis not present

## 2014-01-29 DIAGNOSIS — E113499 Type 2 diabetes mellitus with severe nonproliferative diabetic retinopathy without macular edema, unspecified eye: Secondary | ICD-10-CM

## 2014-01-29 DIAGNOSIS — L97511 Non-pressure chronic ulcer of other part of right foot limited to breakdown of skin: Secondary | ICD-10-CM

## 2014-01-29 DIAGNOSIS — I120 Hypertensive chronic kidney disease with stage 5 chronic kidney disease or end stage renal disease: Secondary | ICD-10-CM | POA: Diagnosis present

## 2014-01-29 DIAGNOSIS — Z792 Long term (current) use of antibiotics: Secondary | ICD-10-CM | POA: Diagnosis not present

## 2014-01-29 DIAGNOSIS — E889 Metabolic disorder, unspecified: Secondary | ICD-10-CM | POA: Diagnosis present

## 2014-01-29 DIAGNOSIS — Z8674 Personal history of sudden cardiac arrest: Secondary | ICD-10-CM | POA: Diagnosis not present

## 2014-01-29 DIAGNOSIS — I679 Cerebrovascular disease, unspecified: Secondary | ICD-10-CM

## 2014-01-29 DIAGNOSIS — Z881 Allergy status to other antibiotic agents status: Secondary | ICD-10-CM | POA: Diagnosis not present

## 2014-01-29 LAB — GLUCOSE, CAPILLARY
Glucose-Capillary: 238 mg/dL — ABNORMAL HIGH (ref 70–99)
Glucose-Capillary: 240 mg/dL — ABNORMAL HIGH (ref 70–99)
Glucose-Capillary: 283 mg/dL — ABNORMAL HIGH (ref 70–99)
Glucose-Capillary: 358 mg/dL — ABNORMAL HIGH (ref 70–99)
Glucose-Capillary: 391 mg/dL — ABNORMAL HIGH (ref 70–99)

## 2014-01-29 LAB — CBC WITH DIFFERENTIAL/PLATELET
Basophils Absolute: 0.1 10*3/uL (ref 0.0–0.1)
Basophils Relative: 1 % (ref 0–1)
Eosinophils Absolute: 0.3 10*3/uL (ref 0.0–0.7)
Eosinophils Relative: 4 % (ref 0–5)
HEMATOCRIT: 33.2 % — AB (ref 36.0–46.0)
HEMOGLOBIN: 10.2 g/dL — AB (ref 12.0–15.0)
LYMPHS ABS: 1 10*3/uL (ref 0.7–4.0)
LYMPHS PCT: 16 % (ref 12–46)
MCH: 29.4 pg (ref 26.0–34.0)
MCHC: 30.7 g/dL (ref 30.0–36.0)
MCV: 95.7 fL (ref 78.0–100.0)
MONO ABS: 0.4 10*3/uL (ref 0.1–1.0)
MONOS PCT: 7 % (ref 3–12)
NEUTROS ABS: 4.4 10*3/uL (ref 1.7–7.7)
Neutrophils Relative %: 72 % (ref 43–77)
Platelets: 221 10*3/uL (ref 150–400)
RBC: 3.47 MIL/uL — ABNORMAL LOW (ref 3.87–5.11)
RDW: 19.6 % — ABNORMAL HIGH (ref 11.5–15.5)
WBC: 6.1 10*3/uL (ref 4.0–10.5)

## 2014-01-29 LAB — URINALYSIS, ROUTINE W REFLEX MICROSCOPIC
Bilirubin Urine: NEGATIVE
Ketones, ur: 15 mg/dL — AB
Leukocytes, UA: NEGATIVE
Nitrite: NEGATIVE
PH: 7 (ref 5.0–8.0)
Protein, ur: >300 mg/dL — AB
SPECIFIC GRAVITY, URINE: 1.024 (ref 1.005–1.030)
Urobilinogen, UA: 0.2 mg/dL (ref 0.0–1.0)

## 2014-01-29 LAB — COMPREHENSIVE METABOLIC PANEL
ALBUMIN: 2.6 g/dL — AB (ref 3.5–5.2)
ALK PHOS: 117 U/L (ref 39–117)
ALT: 15 U/L (ref 0–35)
ANION GAP: 16 — AB (ref 5–15)
AST: 18 U/L (ref 0–37)
BUN: 47 mg/dL — ABNORMAL HIGH (ref 6–23)
CO2: 29 mEq/L (ref 19–32)
Calcium: 8.7 mg/dL (ref 8.4–10.5)
Chloride: 91 mEq/L — ABNORMAL LOW (ref 96–112)
Creatinine, Ser: 4.54 mg/dL — ABNORMAL HIGH (ref 0.50–1.10)
GFR calc Af Amer: 12 mL/min — ABNORMAL LOW (ref >90)
GFR calc non Af Amer: 11 mL/min — ABNORMAL LOW (ref >90)
Glucose, Bld: 238 mg/dL — ABNORMAL HIGH (ref 70–99)
Potassium: 4.1 mEq/L (ref 3.7–5.3)
Sodium: 136 mEq/L — ABNORMAL LOW (ref 137–147)
TOTAL PROTEIN: 6 g/dL (ref 6.0–8.3)
Total Bilirubin: 0.6 mg/dL (ref 0.3–1.2)

## 2014-01-29 LAB — I-STAT VENOUS BLOOD GAS, ED
Acid-Base Excess: 11 mmol/L — ABNORMAL HIGH (ref 0.0–2.0)
Bicarbonate: 37.2 mEq/L — ABNORMAL HIGH (ref 20.0–24.0)
O2 Saturation: 27 %
TCO2: 39 mmol/L (ref 0–100)
pCO2, Ven: 56.2 mmHg — ABNORMAL HIGH (ref 45.0–50.0)
pH, Ven: 7.428 — ABNORMAL HIGH (ref 7.250–7.300)
pO2, Ven: 18 mmHg — CL (ref 30.0–45.0)

## 2014-01-29 LAB — I-STAT CG4 LACTIC ACID, ED: Lactic Acid, Venous: 1.26 mmol/L (ref 0.5–2.2)

## 2014-01-29 LAB — URINE MICROSCOPIC-ADD ON

## 2014-01-29 MED ORDER — ONDANSETRON HCL 4 MG/2ML IJ SOLN: 4.0000 mg | Freq: Four times a day (QID) | INTRAMUSCULAR | Status: DC | PRN

## 2014-01-29 MED ORDER — INSULIN ASPART 100 UNIT/ML ~~LOC~~ SOLN
8.0000 [IU] | Freq: Once | SUBCUTANEOUS | Status: AC
  Administered 2014-01-29: 8 [IU] via SUBCUTANEOUS

## 2014-01-29 MED ORDER — MORPHINE SULFATE 2 MG/ML IJ SOLN
1.0000 mg | Freq: Once | INTRAMUSCULAR | Status: AC
  Administered 2014-01-29: 1 mg via INTRAVENOUS

## 2014-01-29 MED ORDER — SODIUM CHLORIDE 0.9 % IV BOLUS (SEPSIS)
500.0000 mL | Freq: Once | INTRAVENOUS | Status: AC
  Administered 2014-01-29: 500 mL via INTRAVENOUS

## 2014-01-29 MED ORDER — TRAMADOL HCL 50 MG PO TABS
50.0000 mg | ORAL_TABLET | Freq: Four times a day (QID) | ORAL | Status: DC | PRN
  Administered 2014-01-29: 50 mg via ORAL
  Filled 2014-01-29: qty 1

## 2014-01-29 MED ORDER — ACETAMINOPHEN 650 MG RE SUPP: 650.0000 mg | Freq: Four times a day (QID) | RECTAL | Status: DC | PRN

## 2014-01-29 MED ORDER — DOXERCALCIFEROL 4 MCG/2ML IV SOLN
5.0000 ug | INTRAVENOUS | Status: DC
  Administered 2014-01-30: 5 ug via INTRAVENOUS
  Filled 2014-01-29: qty 4

## 2014-01-29 MED ORDER — VANCOMYCIN HCL IN DEXTROSE 1-5 GM/200ML-% IV SOLN
1000.0000 mg | Freq: Once | INTRAVENOUS | Status: AC
  Administered 2014-01-29: 1000 mg via INTRAVENOUS
  Filled 2014-01-29: qty 200

## 2014-01-29 MED ORDER — SODIUM CHLORIDE 0.9 % IV SOLN
62.5000 mg | INTRAVENOUS | Status: DC
  Administered 2014-01-29: 62.5 mg via INTRAVENOUS
  Filled 2014-01-29: qty 5

## 2014-01-29 MED ORDER — INSULIN ASPART 100 UNIT/ML ~~LOC~~ SOLN
0.0000 [IU] | Freq: Three times a day (TID) | SUBCUTANEOUS | Status: DC
  Administered 2014-01-29 (×2): 3 [IU] via SUBCUTANEOUS
  Administered 2014-01-29: 5 [IU] via SUBCUTANEOUS

## 2014-01-29 MED ORDER — METHYLCELLULOSE 1 % OP SOLN: 1.0000 [drp] | Freq: Every evening | OPHTHALMIC | Status: DC | PRN

## 2014-01-29 MED ORDER — DOCUSATE SODIUM 100 MG PO CAPS
200.0000 mg | ORAL_CAPSULE | Freq: Two times a day (BID) | ORAL | Status: DC
  Administered 2014-01-29 – 2014-01-30 (×2): 200 mg via ORAL
  Filled 2014-01-29 (×3): qty 2

## 2014-01-29 MED ORDER — DEXTROSE 5 % IV SOLN
2.0000 g | INTRAVENOUS | Status: DC
  Filled 2014-01-29: qty 2

## 2014-01-29 MED ORDER — DARBEPOETIN ALFA-POLYSORBATE 100 MCG/0.5ML IJ SOLN
100.0000 ug | INTRAMUSCULAR | Status: DC
  Administered 2014-01-30: 100 ug via INTRAVENOUS
  Filled 2014-01-29: qty 0.5

## 2014-01-29 MED ORDER — ACETAMINOPHEN 325 MG PO TABS: 650.0000 mg | ORAL_TABLET | Freq: Four times a day (QID) | ORAL | Status: DC | PRN

## 2014-01-29 MED ORDER — VANCOMYCIN HCL IN DEXTROSE 1-5 GM/200ML-% IV SOLN
1000.0000 mg | INTRAVENOUS | Status: DC
  Administered 2014-01-30: 1000 mg via INTRAVENOUS
  Filled 2014-01-29 (×2): qty 200

## 2014-01-29 MED ORDER — POLYETHYLENE GLYCOL 3350 17 G PO PACK: 17.0000 g | PACK | Freq: Every day | ORAL | Status: DC | PRN

## 2014-01-29 MED ORDER — CALCIUM ACETATE 667 MG PO CAPS
1334.0000 mg | ORAL_CAPSULE | Freq: Three times a day (TID) | ORAL | Status: DC
  Administered 2014-01-29 – 2014-01-30 (×4): 1334 mg via ORAL
  Filled 2014-01-29 (×6): qty 2

## 2014-01-29 MED ORDER — IOHEXOL 300 MG/ML  SOLN
25.0000 mL | INTRAMUSCULAR | Status: DC
  Administered 2014-01-29: 25 mL via ORAL

## 2014-01-29 MED ORDER — PIPERACILLIN-TAZOBACTAM IN DEX 2-0.25 GM/50ML IV SOLN
2.2500 g | Freq: Three times a day (TID) | INTRAVENOUS | Status: DC
  Administered 2014-01-29 – 2014-01-30 (×4): 2.25 g via INTRAVENOUS
  Filled 2014-01-29 (×7): qty 50

## 2014-01-29 MED ORDER — ZOLPIDEM TARTRATE 5 MG PO TABS
5.0000 mg | ORAL_TABLET | Freq: Once | ORAL | Status: AC
  Administered 2014-01-29: 5 mg via ORAL
  Filled 2014-01-29: qty 1

## 2014-01-29 MED ORDER — POLYVINYL ALCOHOL 1.4 % OP SOLN: 1.0000 [drp] | Freq: Every evening | OPHTHALMIC | Status: DC | PRN

## 2014-01-29 MED ORDER — MORPHINE SULFATE 2 MG/ML IJ SOLN
1.0000 mg | INTRAMUSCULAR | Status: DC | PRN
  Administered 2014-01-29 – 2014-01-30 (×3): 1 mg via INTRAVENOUS
  Filled 2014-01-29 (×2): qty 1

## 2014-01-29 MED ORDER — URSODIOL 300 MG PO CAPS
300.0000 mg | ORAL_CAPSULE | Freq: Two times a day (BID) | ORAL | Status: DC
  Administered 2014-01-29 – 2014-01-30 (×3): 300 mg via ORAL
  Filled 2014-01-29 (×4): qty 1

## 2014-01-29 MED ORDER — HYDROCODONE-ACETAMINOPHEN 5-325 MG PO TABS
1.0000 | ORAL_TABLET | Freq: Three times a day (TID) | ORAL | Status: DC | PRN
  Administered 2014-01-29 – 2014-01-30 (×3): 1 via ORAL
  Filled 2014-01-29 (×3): qty 1

## 2014-01-29 MED ORDER — PANTOPRAZOLE SODIUM 40 MG PO TBEC
40.0000 mg | DELAYED_RELEASE_TABLET | Freq: Every day | ORAL | Status: DC
  Administered 2014-01-29 – 2014-01-30 (×2): 40 mg via ORAL
  Filled 2014-01-29 (×2): qty 1

## 2014-01-29 MED ORDER — IOHEXOL 300 MG/ML  SOLN
100.0000 mL | Freq: Once | INTRAMUSCULAR | Status: AC | PRN
  Administered 2014-01-29: 100 mL via INTRAVENOUS

## 2014-01-29 MED ORDER — CYCLOBENZAPRINE HCL 10 MG PO TABS
10.0000 mg | ORAL_TABLET | Freq: Three times a day (TID) | ORAL | Status: DC | PRN
  Administered 2014-01-29: 10 mg via ORAL
  Filled 2014-01-29: qty 1

## 2014-01-29 MED ORDER — ENOXAPARIN SODIUM 30 MG/0.3ML ~~LOC~~ SOLN
30.0000 mg | SUBCUTANEOUS | Status: DC
Start: 2014-01-29 — End: 2014-01-30
  Administered 2014-01-29 – 2014-01-30 (×2): 30 mg via SUBCUTANEOUS
  Filled 2014-01-29 (×3): qty 0.3

## 2014-01-29 MED ORDER — INSULIN DETEMIR 100 UNIT/ML ~~LOC~~ SOLN
20.0000 [IU] | Freq: Every evening | SUBCUTANEOUS | Status: DC
  Administered 2014-01-29: 20 [IU] via SUBCUTANEOUS
  Filled 2014-01-29 (×3): qty 0.2

## 2014-01-29 MED ORDER — ASPIRIN EC 81 MG PO TBEC
81.0000 mg | DELAYED_RELEASE_TABLET | Freq: Every day | ORAL | Status: DC
  Administered 2014-01-29 – 2014-01-30 (×2): 81 mg via ORAL
  Filled 2014-01-29 (×2): qty 1

## 2014-01-29 MED ORDER — SODIUM CHLORIDE 0.9 % IJ SOLN
3.0000 mL | Freq: Two times a day (BID) | INTRAMUSCULAR | Status: DC
  Administered 2014-01-29: 3 mL via INTRAVENOUS

## 2014-01-29 MED ORDER — MORPHINE SULFATE 2 MG/ML IJ SOLN
INTRAMUSCULAR | Status: AC
  Filled 2014-01-29: qty 1

## 2014-01-29 MED ORDER — ONDANSETRON HCL 4 MG/2ML IJ SOLN
4.0000 mg | INTRAMUSCULAR | Status: DC | PRN
  Administered 2014-01-29: 4 mg via INTRAVENOUS
  Filled 2014-01-29: qty 2

## 2014-01-29 MED ORDER — ATORVASTATIN CALCIUM 80 MG PO TABS
80.0000 mg | ORAL_TABLET | Freq: Every day | ORAL | Status: DC
  Administered 2014-01-29: 80 mg via ORAL
  Filled 2014-01-29 (×2): qty 1

## 2014-01-29 MED ORDER — INSULIN DETEMIR 100 UNIT/ML FLEXPEN: 20.0000 [IU] | PEN_INJECTOR | Freq: Every evening | SUBCUTANEOUS | Status: DC

## 2014-01-29 MED ORDER — POLYETHYLENE GLYCOL 3350 17 G PO PACK
17.0000 g | PACK | Freq: Two times a day (BID) | ORAL | Status: DC
  Administered 2014-01-29 – 2014-01-30 (×2): 17 g via ORAL
  Filled 2014-01-29 (×4): qty 1

## 2014-01-29 MED ORDER — RENA-VITE PO TABS
1.0000 | ORAL_TABLET | Freq: Every day | ORAL | Status: DC
  Administered 2014-01-29 – 2014-01-30 (×2): 1 via ORAL
  Filled 2014-01-29 (×2): qty 1

## 2014-01-29 MED ORDER — MIDODRINE HCL 5 MG PO TABS
5.0000 mg | ORAL_TABLET | ORAL | Status: DC
  Administered 2014-01-30: 5 mg via ORAL
  Filled 2014-01-29: qty 1

## 2014-01-29 MED ORDER — PROMETHAZINE HCL 25 MG PO TABS: 25.0000 mg | ORAL_TABLET | Freq: Three times a day (TID) | ORAL | Status: DC | PRN

## 2014-01-29 MED ORDER — ONDANSETRON HCL 4 MG PO TABS
4.0000 mg | ORAL_TABLET | Freq: Four times a day (QID) | ORAL | Status: DC | PRN
Start: 2014-01-29 — End: 2014-01-30

## 2014-01-29 MED ORDER — MAGNESIUM HYDROXIDE 400 MG/5ML PO SUSP
960.0000 mL | Freq: Once | ORAL | Status: AC
Start: 2014-01-29 — End: 2014-01-29
  Administered 2014-01-29: 960 mL via RECTAL
  Filled 2014-01-29: qty 240

## 2014-01-29 NOTE — Consult Note (Signed)
[facility] Renal Consultation Note  Indication for Consultation:  Management of ESRD/hemodialysis; anemia, hypertension/volume and secondary hyperparathyroidism  HPI: Hannah [name] is a 43 y.o. female with a history of Diabetes Type 1, CAD, ischemic cardiomyopathy (EF 25-30%), lung cancer s/p LLL resection 2013, right axillary DVT on Coumadin, and ESRD on dialysis at the [facility] (AKI on CKD 21 Aug 2013 - dialysis dependent since 09/06/13 CVVHD 5/21-6/27)  and first IHD 6/29 - currently outpt HD [place])  She presented to the ER today with worsening left lower quadrant abdominal pain.  She had a prolonged hospitalization 5/18-7/13/15, initially for evaluation of CAD, but developed cardiogenic shock, requiring CVVHD and placement of an IABP, after which she developed right foot ischemia and subsequently required femoral thromboembolectomy and fasciotomy of her right lower extremity, but had groin and fasciotomy wounds requiring wound vacs, several debridements, and long-term antibiotics and wound care.  Her wounds are still healing, but CT today showed inflammatory changes around the right lower quadrant, suggesting cellulitis, and she was placed on IV Vancomycin and Zosyn.  However, her current pain originated on the left, and a moderate to large stool burden was noted on CT.  Her abdominal pain has improved after an enema, but she will be monitored for any signs or symptoms of infection.  She denies any fever, chills, nausea, vomiting, or diarrhea.   Past Medical History  Diagnosis Date  . High cholesterol   . Diabetic retinopathy   . Peripheral neuropathy     "tips of toes"  . Blind right eye   . CHF (congestive heart failure)   . CAD (coronary artery disease)   . GERD (gastroesophageal reflux disease)   . Hypertension   . History of lung cancer 07/2011    s/p left lower lobectomy  . Diabetes mellitus     IDDM  . Cataract of right eye   . Ulcer of toe of  right foot 07/10/2012    great toe  . Breast calcification, left 06/2012  . Acute biphenotypic leukemia   . CKD (chronic kidney disease) stage 5, GFR less than 15 ml/min   . Nephrotic syndrome   . Gastritis     H/o gastritis on prior endoscopy  . Anemia   . Carotid artery disease   . Gallstones   . Myocardial infarction 5/16   Past Surgical History  Procedure Laterality Date  . Cardiac catheterization  07/16/2011  . Incision and drainage breast abscess Left   . Tubal ligation  1994  . Vitrectomy  2010    2 on left, 1 on right  . Cesarean section  1991; 1994  . Video assisted thoracoscopy (vats)/ lobectomy Left 07/30/2011    left main thoracotomy, left lower lobectomy, mediastinal lymph node dissection  . Lobectomy    . Colonoscopy with esophagogastroduodenoscopy (egd) N/A 08/14/2012    Procedure: COLONOSCOPY WITH ESOPHAGOGASTRODUODENOSCOPY (EGD);  Surgeon: Danie Binder, MD;  [facility];  Service: Endoscopy;  Laterality: N/A;  10:45-moved to [address] to notify pt  . Breast lumpectomy with needle localization Left 11/14/2012    Procedure: BREAST LUMPECTOMY WITH NEEDLE LOCALIZATION;  Surgeon: Marcello Moores A. Cornett, MD;  Location: [place];  Service: General;  Laterality: Left;  . Insertion of dialysis catheter Left 09/12/2013    Procedure: INSERTION OF DIALYSIS CATHETER;  Surgeon: Mal Misty, MD;  Location: [place];  Service: Vascular;  Laterality: Left;  . Embolectomy Right 09/17/2013    Procedure: Thrombectomy of  Right Common Femoral Artery;  Surgeon: Elam Dutch, MD;  Location: [facility] OR;  Service: Vascular;  Laterality: Right;  . Endarterectomy femoral Right 09/17/2013    Procedure: Right Femoral Endarterectomy;  Surgeon: Elam Dutch, MD;  Location: [facility] OR;  Service: Vascular;  Laterality: Right;  . Patch angioplasty Right 09/17/2013    Procedure: Vein Patch Angioplasty of Right Femoral Artery;  Surgeon: Elam Dutch, MD;  Location: [place];  Service: Vascular;   Laterality: Right;  . Fasciotomy Right 09/17/2013    Procedure: Four Compartment Fasciotomy;  Surgeon: Elam Dutch, MD;  Location: [place];  Service: Vascular;  Laterality: Right;  . Fasciotomy closure Right 09/19/2013    Procedure: FASCIOTOMY CLOSURE;  Surgeon: Elam Dutch, MD;  Location: [place];  Service: Vascular;  Laterality: Right;  regional block and monitored anesthesia care used  . I&d extremity Right 09/28/2013    Procedure: IRRIGATION AND DEBRIDEMENT EXTREMITY;  Surgeon: Serafina Mitchell, MD;  Location: [place];  Service: Vascular;  Laterality: Right;  . Application of wound vac Right 09/28/2013    Procedure: APPLICATION OF WOUND VAC;  Surgeon: Serafina Mitchell, MD;  Location: [place];  Service: Vascular;  Laterality: Right;  . Av fistula placement Right 01/03/2014    Procedure: ARTERIOVENOUS (AV) FISTULA CREATION;  Surgeon: Angelia Mould, MD;  Location: [place];  Service: Vascular;  Laterality: Right;  . Ligation of arteriovenous  fistula Right 01/04/2014    Procedure: LIGATION OF ARTERIOVENOUS  FISTULA;  Surgeon: Rosetta Posner, MD;  Location: [facility] OR;  Service: Vascular;  Laterality: Right;   Family History  Problem Relation Age of Onset  . Coronary artery disease Father   . Asthma Father   . COPD Father   . Hypertension Father   . Hyperlipidemia Father   . Diabetes Father   . Congestive Heart Failure Father   . Heart disease Father     before age 45  . Heart attack Father   . Peripheral vascular disease Father   . Hypertension Mother   . Hyperlipidemia Mother   . Diabetes Mother   . Cancer Mother   . Cancer Maternal Aunt      three aunts, bone, breast, ?  . Hypertension Brother   . Diabetes Brother   . Diabetes Sister   . Colon cancer Neg Hx   . Celiac disease Neg Hx   . Crohn's disease Neg Hx   . Ulcerative colitis Neg Hx   . Lung cancer Maternal Grandmother    Social History  She previously smoked as much as 3 packs of cigarettes a day before quitting in 07/2011  prior to surgery for lung cancer.   She denies any history of alcohol or illicit drug use.  Allergies  Allergen Reactions  . Crestor [Rosuvastatin] Other (See Comments)    Severe muscle weakness  . Nsaids Other (See Comments)    Not allergic, "bad on my kidneys"  . Ciprofloxacin Rash   Prior to Admission medications   Medication Sig Start Date End Date Taking? Authorizing Provider  aspirin EC 81 MG EC tablet Take 1 tablet (81 mg total) by mouth daily. 08/17/13  Yes Belkys A Regalado, MD  atorvastatin (LIPITOR) 80 MG tablet Take 1 tablet (80 mg total) by mouth daily at 6 PM. 10/29/13  Yes Amy D Clegg, NP  calcium acetate (PHOSLO) 667 MG capsule Take 2 capsules (1,334 mg total) by mouth 3 (three) times daily with meals. 11/10/13  Yes Vijaya  Karleen Hampshire, MD  cyclobenzaprine (FLEXERIL) 10 MG tablet Take 10 mg by mouth 3 (three) times daily as needed for muscle spasms.   Yes Historical Provider, MD  cyclobenzaprine (FLEXERIL) 10 MG tablet TAKE ONE TABLET BY MOUTH THREE TIMES DAILY AS NEEDED FOR MUSCLE SPASM 01/22/14  Yes Alycia Rossetti, MD  HYDROcodone-acetaminophen (NORCO) 5-325 MG per tablet Take 1 tablet by mouth 3 (three) times daily as needed for moderate pain. 01/08/14  Yes Alycia Rossetti, MD  Insulin Detemir (LEVEMIR FLEXPEN) 100 UNIT/ML Pen Inject 20 Units into the skin every evening. 10/29/13  Yes Amy D Clegg, NP  insulin lispro (HUMALOG KWIKPEN) 100 UNIT/ML KiwkPen Inject 0.05 mLs (5 Units total) into the skin 3 (three) times daily with meals. 01/26/14  Yes Alycia Rossetti, MD  methylcellulose (ARTIFICIAL TEARS) 1 % ophthalmic solution Place 1 drop into both eyes at bedtime as needed (dry eyes).   Yes Historical Provider, MD  midodrine (PROAMATINE) 10 MG tablet Take 5 mg by mouth 3 (three) times a week. Monday, Wednesday and Friday   Yes Historical Provider, MD  multivitamin (RENA-VIT) TABS tablet Take 1 tablet by mouth daily.   Yes Historical Provider, MD  pantoprazole (PROTONIX) 40 MG tablet  Take 1 tablet (40 mg total) by mouth daily. 10/29/13  Yes Amy D Clegg, NP  polyethylene glycol (MIRALAX / GLYCOLAX) packet Take 17 g by mouth daily as needed (constipation). 10/29/13  Yes Amy D Clegg, NP  promethazine (PHENERGAN) 25 MG tablet Take 1 tablet (25 mg total) by mouth every 8 (eight) hours as needed for nausea or vomiting. 12/11/13  Yes Susy Frizzle, MD  traMADol (ULTRAM) 50 MG tablet Take 1 tablet (50 mg total) by mouth every 6 (six) hours as needed for moderate pain. 01/07/14  Yes Alycia Rossetti, MD  ursodiol (ACTIGALL) 300 MG capsule Take 1 capsule (300 mg total) by mouth 2 (two) times daily. 11/29/13  Yes Ripudeep Krystal Eaton, MD  cephALEXin (KEFLEX) 500 MG capsule Take 500 mg by mouth 4 (four) times daily. Completed a week ago from today (01-28-14) 01/10/14   Susy Frizzle, MD  Review of Systems Constitutional: negative for chills, fatigue, fevers and sweats Ears, nose, mouth, throat, and face: negative for earaches, hoarseness, nasal congestion and sore throat Respiratory: negative for cough, dyspnea on exertion, hemoptysis and sputum Cardiovascular: negative for chest pain, chest pressure/discomfort, dyspnea, orthopnea and palpitations Gastrointestinal: positive for lower abdominal pain and constipation, negative for nausea and vomiting Genitourinary:negative, oliguric Musculoskeletal:negative for arthralgias, back pain, myalgias and neck pain Neurological: negative for dizziness, gait problems, headaches, paresthesia and speech problems  Physical Examination BP 99/58  Pulse 107  Temp(Src) 97.7 F (36.5 C) (Oral)  Resp 18  Ht _0  (1.702 m)  Wt 117.028 kg (258 lb)  BMI 40.40 kg/m2  SpO2 94% General appearance: alert, cooperative and no distress Head: Normocephalic, without obvious abnormality, atraumatic Neck: no adenopathy, no JVD and supple, symmetrical, trachea midline Resp: clear to auscultation bilaterally Cardio: regular rate and rhythm, S1, S2 normal, no murmur,  click, rub or gallop GI: + BS, soft with LLQ tenderness, dressing on RLQ wound erythema lower ant abd wall mostly to the right; tender in LLQ to minimal palpation with no cellulitic change on that side Extremities: R lower leg wounds with dressings, no cyanosis, trace edema Neurologic: Grossly normal Dialysis Access: L IJ catheter with intact dressing   Labs:  Results for orders placed during the hospital encounter of 01/29/14 (from the past  48 hour(s))  CBC WITH DIFFERENTIAL     Status: Abnormal   Collection Time    01/28/14  9:17 PM      Result Value Ref Range   WBC 7.2  4.0 - 10.5 K/uL   RBC 3.67 (*) 3.87 - 5.11 MIL/uL   Hemoglobin 11.0 (*) 12.0 - 15.0 g/dL   HCT 35.3 (*) 36.0 - 46.0 %   MCV 96.2  78.0 - 100.0 fL   MCH 30.0  26.0 - 34.0 pg   MCHC 31.2  30.0 - 36.0 g/dL   RDW 19.3 (*) 11.5 - 15.5 %   Platelets 226  150 - 400 K/uL   Neutrophils Relative % 82 (*) 43 - 77 %   Neutro Abs 6.0  1.7 - 7.7 K/uL   Lymphocytes Relative 9 (*) 12 - 46 %   Lymphs Abs 0.6 (*) 0.7 - 4.0 K/uL   Monocytes Relative 5  3 - 12 %   Monocytes Absolute 0.4  0.1 - 1.0 K/uL   Eosinophils Relative 3  0 - 5 %   Eosinophils Absolute 0.2  0.0 - 0.7 K/uL   Basophils Relative 1  0 - 1 %   Basophils Absolute 0.1  0.0 - 0.1 K/uL  COMPREHENSIVE METABOLIC PANEL     Status: Abnormal   Collection Time    01/28/14  9:17 PM      Result Value Ref Range   Sodium 135 (*) 137 - 147 mEq/L   Potassium 4.0  3.7 - 5.3 mEq/L   Chloride 89 (*) 96 - 112 mEq/L   CO2 29  19 - 32 mEq/L   Glucose, Bld 309 (*) 70 - 99 mg/dL   BUN 38 (*) 6 - 23 mg/dL   Creatinine, Ser 3.90 (*) 0.50 - 1.10 mg/dL   Calcium 8.5  8.4 - 10.5 mg/dL   Total Protein 6.3  6.0 - 8.3 g/dL   Albumin 2.7 (*) 3.5 - 5.2 g/dL   AST 22  0 - 37 U/L   ALT 16  0 - 35 U/L   Alkaline Phosphatase 123 (*) 39 - 117 U/L   Total Bilirubin 0.7  0.3 - 1.2 mg/dL   GFR calc non Af Amer 13 (*) >90 mL/min   GFR calc Af Amer 15 (*) >90 mL/min   Comment: (NOTE)      The eGFR has been calculated using the CKD EPI equation.     This calculation has not been validated in all clinical situations.     eGFR's persistently <90 mL/min signify possible Chronic Kidney     Disease.   Anion gap 17 (*) 5 - 15  URINALYSIS, ROUTINE W REFLEX MICROSCOPIC     Status: Abnormal   Collection Time    01/29/14  1:48 AM      Result Value Ref Range   Color, Urine AMBER (*) YELLOW   Comment: BIOCHEMICALS MAY BE AFFECTED BY COLOR   APPearance CLOUDY (*) CLEAR   Specific Gravity, Urine 1.024  1.005 - 1.030   pH 7.0  5.0 - 8.0   Glucose, UA >1000 (*) NEGATIVE mg/dL   Hgb urine dipstick MODERATE (*) NEGATIVE   Bilirubin Urine NEGATIVE  NEGATIVE   Ketones, ur 15 (*) NEGATIVE mg/dL   Protein, ur >300 (*) NEGATIVE mg/dL   Urobilinogen, UA 0.2  0.0 - 1.0 mg/dL   Nitrite NEGATIVE  NEGATIVE   Leukocytes, UA NEGATIVE  NEGATIVE  URINE MICROSCOPIC-ADD ON     Status: Abnormal  Collection Time    01/29/14  1:48 AM      Result Value Ref Range   Squamous Epithelial / LPF FEW (*) RARE   WBC, UA 3-6  <3 WBC/hpf   RBC / HPF 3-6  <3 RBC/hpf   Bacteria, UA FEW (*) RARE   Casts HYALINE CASTS (*) NEGATIVE  I-STAT VENOUS BLOOD GAS, ED     Status: Abnormal   Collection Time    01/29/14  2:36 AM      Result Value Ref Range   pH, Ven 7.428 (*) 7.250 - 7.300   pCO2, Ven 56.2 (*) 45.0 - 50.0 mmHg   pO2, Ven 18.0 (*) 30.0 - 45.0 mmHg   Bicarbonate 37.2 (*) 20.0 - 24.0 mEq/L   TCO2 39  0 - 100 mmol/L   O2 Saturation 27.0     Acid-Base Excess 11.0 (*) 0.0 - 2.0 mmol/L   Sample type VENOUS     Comment NOTIFIED PHYSICIAN    I-STAT CG4 LACTIC ACID, ED     Status: None   Collection Time    01/29/14  2:37 AM      Result Value Ref Range   Lactic Acid, Venous 1.26  0.5 - 2.2 mmol/L  COMPREHENSIVE METABOLIC PANEL     Status: Abnormal   Collection Time    01/29/14  8:26 AM      Result Value Ref Range   Sodium 136 (*) 137 - 147 mEq/L   Potassium 4.1  3.7 - 5.3 mEq/L   Chloride 91 (*) 96 -  112 mEq/L   CO2 29  19 - 32 mEq/L   Glucose, Bld 238 (*) 70 - 99 mg/dL   BUN 47 (*) 6 - 23 mg/dL   Creatinine, Ser 4.54 (*) 0.50 - 1.10 mg/dL   Calcium 8.7  8.4 - 10.5 mg/dL   Total Protein 6.0  6.0 - 8.3 g/dL   Albumin 2.6 (*) 3.5 - 5.2 g/dL   AST 18  0 - 37 U/L   ALT 15  0 - 35 U/L   Alkaline Phosphatase 117  39 - 117 U/L   Total Bilirubin 0.6  0.3 - 1.2 mg/dL   GFR calc non Af Amer 11 (*) >90 mL/min   GFR calc Af Amer 12 (*) >90 mL/min   Comment: (NOTE)     The eGFR has been calculated using the CKD EPI equation.     This calculation has not been validated in all clinical situations.     eGFR's persistently <90 mL/min signify possible Chronic Kidney     Disease.   Anion gap 16 (*) 5 - 15  CBC WITH DIFFERENTIAL     Status: Abnormal   Collection Time    01/29/14  8:26 AM      Result Value Ref Range   WBC 6.1  4.0 - 10.5 K/uL   RBC 3.47 (*) 3.87 - 5.11 MIL/uL   Hemoglobin 10.2 (*) 12.0 - 15.0 g/dL   HCT 33.2 (*) 36.0 - 46.0 %   MCV 95.7  78.0 - 100.0 fL   MCH 29.4  26.0 - 34.0 pg   MCHC 30.7  30.0 - 36.0 g/dL   RDW 19.6 (*) 11.5 - 15.5 %   Platelets 221  150 - 400 K/uL   Neutrophils Relative % 72  43 - 77 %   Neutro Abs 4.4  1.7 - 7.7 K/uL   Lymphocytes Relative 16  12 - 46 %   Lymphs Abs 1.0  0.7 - 4.0 K/uL   Monocytes Relative 7  3 - 12 %   Monocytes Absolute 0.4  0.1 - 1.0 K/uL   Eosinophils Relative 4  0 - 5 %   Eosinophils Absolute 0.3  0.0 - 0.7 K/uL   Basophils Relative 1  0 - 1 %   Basophils Absolute 0.1  0.0 - 0.1 K/uL  GLUCOSE, CAPILLARY     Status: Abnormal   Collection Time    01/29/14  8:48 AM      Result Value Ref Range   Glucose-Capillary 240 (*) 70 - 99 mg/dL  GLUCOSE, CAPILLARY     Status: Abnormal   Collection Time    01/29/14 12:21 PM      Result Value Ref Range   Glucose-Capillary 238 (*) 70 - 99 mg/dL    Dialysis Orders:  MWF @ [facility] 4 hrs    116 kg     2K/2.25Ca      400/A1.5       Heparin 5000 U     L IJ catheter Hectorol 5 mcg         Aranesp 120 mcg & Venofer 50 mg on Wed.  Assessment/Plan: 1. LLQ pain - more likely sec to constipation, improved s/p enema; RLQ wound healing, on IV Vancomycin & Zosyn. 2. RLQ & RLE wounds - S/p thromboembolectomy, fasciotomy, several debridements, antibiotics; almost healed according to pt. 3. ESRD - HD on MWF @ [facility], K 4.1.  HD tomorrow. 4. Dialysis access - L IJ catheter; AVF @ [facility] placed 9/17, ligated 9/18 sec to steal, follow-up 10/29. 5. Hypertension/volume - BP 99/58 on Midodrine 5 mg pre-HD; wt 117 kg, usually gains 2-3 L and reaches UF goal. 6. Anemia - Hgb 10.2, Aranesp 120 mcg & Venofer 50 mg on Weds. 7. Metabolic bone disease - Ca 8.7 (9.8 corrected), last P 6.9, iPTH 449; Hectorol 5 mcg, Phoslo 3 with meals. 8. Nutrition - Alb 2.6, renal carb-mod diet, multivitamin. 9. DM Type 1 - with retinopathy, on insulin per primary. 10. CAD - 3V disease, no targets for revascularization per cath 08/2013. 11. Carotid stenosis - R 60-79%, L 80-99%; high grade left carotid stenosis for carotid stenting in the future (Dr. Trula Slade) 12. Failed RUE AVF d/t steal (ligated 12/2013) 13. 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 Oneida Alar) 35. S/p acute biliary colic 04/8548 resolved with medical management - high risk for surgery   LYLES,CHARLES 01/29/2014, 3:08 PM   Attending Nephrologist:  Jamal Maes, MD I have seen and examined this patient and agree with plan and assessment as outlined in the above note. 43 yo vasculopath with DM, HTN, isch CM, multiple medical problems. Admitted with LLL abd pain increasing over 3-4 days.  CTA showed inflammatory change around RLC scar (opposite to side of pain) and large stool burden.  Exam is c/w mild abd wall cellulutis mid-right side AND sig LLQ tenderness to palpation. Currently getting IV vanco/zosyn as well as bowel regimen.  Due for dialysis tomorrow. Has TDC. Recent (12/2013) AVF with subsequent ligation RUE d/t steal.    Taylar Hartsough B,MD 01/29/2014 4:48 PM

## 2014-01-29 NOTE — Consult Note (Signed)
WOC wound consult note Reason for Consult: Consult requested for right abd and leg chronic wounds; pt states she is followed as an outpatient by the [facility] service. Wound type: Right abd full thickness healing wound; 1X.3X.1cm; 100% pink and moist, no odor, small amt green drainage. Right outer leg and inner leg with dry scabbed scar tissue; no open wound or drainage. Dressing procedure/placement/frequency: Foam dressings to protect leg scar tissue from further injury. Continue present plan of care with moist gauze dressing.  Pt can resume follow-up with vascular team after discharge. She is well-informed regarding topical treatment and wound care. Please re-consult if further assistance is needed.  Thank-you,  Julien Girt MSN, [place], [place], [place], [place]

## 2014-01-29 NOTE — ED Provider Notes (Signed)
CSN: [id number]     Arrival date & time 01/28/14  2102 History   First MD Initiated Contact with Patient 01/29/14 0202     Chief Complaint  Patient presents with  . Abdominal Pain     (Consider location/radiation/quality/duration/timing/severity/associated sxs/prior Treatment) HPI Comments: PT comes in with cc of lower quadrant abdominal pain x 2 days. Pain is described as sharp pain and is sensitive to touch. Pain has gradually intensified. No n/v/diarrhea. Subjective fevers - tmax at home 100.1.  No dysuria. No hx of similar pain. No vaginal discharge or bleeding.  Pt does states that she had RLQ wound, from intra-arterial balloon pump placement, which is healing slowly, but with some yellow discharge recently.  Pt has ESRD on HD since May, HTN, DM, CAD, CHF. + hx of cellulitis.  Patient is a 43 y.o. female presenting with abdominal pain. The history is provided by the patient.  Abdominal Pain Associated symptoms: fever and nausea   Associated symptoms: no chest pain, no chills, no constipation, no cough, no diarrhea, no dysuria, no hematuria, no shortness of breath, no vaginal bleeding, no vaginal discharge and no vomiting     Past Medical History  Diagnosis Date  . High cholesterol   . Diabetic retinopathy   . Peripheral neuropathy     "tips of toes"  . Blind right eye   . CHF (congestive heart failure)   . CAD (coronary artery disease)   . GERD (gastroesophageal reflux disease)   . Hypertension   . History of lung cancer 07/2011    s/p left lower lobectomy  . Diabetes mellitus     IDDM  . Cataract of right eye   . Ulcer of toe of right foot 07/10/2012    great toe  . Breast calcification, left 06/2012  . Acute biphenotypic leukemia   . CKD (chronic kidney disease) stage 5, GFR less than 15 ml/min   . Nephrotic syndrome   . Gastritis     H/o gastritis on prior endoscopy  . Anemia   . Carotid artery disease   . Gallstones   . Myocardial infarction 5/16   Past  Surgical History  Procedure Laterality Date  . Cardiac catheterization  07/16/2011  . Incision and drainage breast abscess Left   . Tubal ligation  1994  . Vitrectomy  2010    2 on left, 1 on right  . Cesarean section  1991; 1994  . Video assisted thoracoscopy (vats)/ lobectomy Left 07/30/2011    left main thoracotomy, left lower lobectomy, mediastinal lymph node dissection  . Lobectomy    . Colonoscopy with esophagogastroduodenoscopy (egd) N/A 08/14/2012    Procedure: COLONOSCOPY WITH ESOPHAGOGASTRODUODENOSCOPY (EGD);  Surgeon: Danie Binder, MD;  Location: [facility];  Service: Endoscopy;  Laterality: N/A;  10:45-moved to 1110 Leigh Ann to notify pt  . Breast lumpectomy with needle localization Left 11/14/2012    Procedure: BREAST LUMPECTOMY WITH NEEDLE LOCALIZATION;  Surgeon: Marcello Moores A. Cornett, MD;  Location: [place];  Service: General;  Laterality: Left;  . Insertion of dialysis catheter Left 09/12/2013    Procedure: INSERTION OF DIALYSIS CATHETER;  Surgeon: Mal Misty, MD;  Location: [place];  Service: Vascular;  Laterality: Left;  . Embolectomy Right 09/17/2013    Procedure: Thrombectomy of Right Common Femoral Artery;  Surgeon: Elam Dutch, MD;  Location: [facility] OR;  Service: Vascular;  Laterality: Right;  . Endarterectomy femoral Right 09/17/2013    Procedure: Right Femoral Endarterectomy;  Surgeon: Elam Dutch,  MD;  Location: [facility] OR;  Service: Vascular;  Laterality: Right;  . Patch angioplasty Right 09/17/2013    Procedure: Vein Patch Angioplasty of Right Femoral Artery;  Surgeon: Elam Dutch, MD;  Location: [place];  Service: Vascular;  Laterality: Right;  . Fasciotomy Right 09/17/2013    Procedure: Four Compartment Fasciotomy;  Surgeon: Elam Dutch, MD;  Location: [place];  Service: Vascular;  Laterality: Right;  . Fasciotomy closure Right 09/19/2013    Procedure: FASCIOTOMY CLOSURE;  Surgeon: Elam Dutch, MD;  Location: [place];  Service: Vascular;  Laterality: Right;   regional block and monitored anesthesia care used  . I&d extremity Right 09/28/2013    Procedure: IRRIGATION AND DEBRIDEMENT EXTREMITY;  Surgeon: Serafina Mitchell, MD;  Location: [place];  Service: Vascular;  Laterality: Right;  . Application of wound vac Right 09/28/2013    Procedure: APPLICATION OF WOUND VAC;  Surgeon: Serafina Mitchell, MD;  Location: [place];  Service: Vascular;  Laterality: Right;  . Av fistula placement Right 01/03/2014    Procedure: ARTERIOVENOUS (AV) FISTULA CREATION;  Surgeon: Angelia Mould, MD;  Location: [place];  Service: Vascular;  Laterality: Right;  . Ligation of arteriovenous  fistula Right 01/04/2014    Procedure: LIGATION OF ARTERIOVENOUS  FISTULA;  Surgeon: Rosetta Posner, MD;  Location: [facility] OR;  Service: Vascular;  Laterality: Right;   Family History  Problem Relation Age of Onset  . Coronary artery disease Father   . Asthma Father   . COPD Father   . Hypertension Father   . Hyperlipidemia Father   . Diabetes Father   . Congestive Heart Failure Father   . Heart disease Father     before age 65  . Heart attack Father   . Peripheral vascular disease Father   . Hypertension Mother   . Hyperlipidemia Mother   . Diabetes Mother   . Cancer Mother   . Cancer Maternal Aunt      three aunts, bone, breast, ?  . Hypertension Brother   . Diabetes Brother   . Diabetes Sister   . Colon cancer Neg Hx   . Celiac disease Neg Hx   . Crohn's disease Neg Hx   . Ulcerative colitis Neg Hx   . Lung cancer Maternal Grandmother    History  Substance Use Topics  . Smoking status: Former Smoker -- 2.00 packs/day for 30 years    Quit date: 08/01/2011  . Smokeless tobacco: Never Used  . Alcohol Use: No   OB History   Grav Para Term Preterm Abortions TAB SAB Ect Mult Living                 Review of Systems  Constitutional: Positive for fever and activity change. Negative for chills.  HENT: Negative for facial swelling.   Respiratory: Negative for cough, shortness  of breath and wheezing.   Cardiovascular: Negative for chest pain.  Gastrointestinal: Positive for nausea and abdominal pain. Negative for vomiting, diarrhea, constipation, blood in stool and abdominal distention.  Genitourinary: Negative for dysuria, hematuria, flank pain, vaginal bleeding, vaginal discharge, difficulty urinating and pelvic pain.  Musculoskeletal: Negative for back pain and neck pain.  Skin: Positive for rash and wound. Negative for color change.  Allergic/Immunologic: Positive for immunocompromised state.  Neurological: Negative for speech difficulty and headaches.  Hematological: Does not bruise/bleed easily.  Psychiatric/Behavioral: Negative for confusion.  All other systems reviewed and are negative.     Allergies  Crestor; Nsaids; and  Ciprofloxacin  Home Medications   Prior to Admission medications   Medication Sig Start Date End Date Taking? Authorizing Provider  aspirin EC 81 MG EC tablet Take 1 tablet (81 mg total) by mouth daily. 08/17/13  Yes Belkys A Regalado, MD  atorvastatin (LIPITOR) 80 MG tablet Take 1 tablet (80 mg total) by mouth daily at 6 PM. 10/29/13  Yes Amy D Clegg, NP  calcium acetate (PHOSLO) 667 MG capsule Take 2 capsules (1,334 mg total) by mouth 3 (three) times daily with meals. 11/10/13  Yes Hosie Poisson, MD  cyclobenzaprine (FLEXERIL) 10 MG tablet Take 10 mg by mouth 3 (three) times daily as needed for muscle spasms.   Yes Historical Provider, MD  cyclobenzaprine (FLEXERIL) 10 MG tablet TAKE ONE TABLET BY MOUTH THREE TIMES DAILY AS NEEDED FOR MUSCLE SPASM 01/22/14  Yes Alycia Rossetti, MD  HYDROcodone-acetaminophen (NORCO) 5-325 MG per tablet Take 1 tablet by mouth 3 (three) times daily as needed for moderate pain. 01/08/14  Yes Alycia Rossetti, MD  Insulin Detemir (LEVEMIR FLEXPEN) 100 UNIT/ML Pen Inject 20 Units into the skin every evening. 10/29/13  Yes Amy D Clegg, NP  insulin lispro (HUMALOG KWIKPEN) 100 UNIT/ML KiwkPen Inject 0.05 mLs (5  Units total) into the skin 3 (three) times daily with meals. 01/26/14  Yes Alycia Rossetti, MD  methylcellulose (ARTIFICIAL TEARS) 1 % ophthalmic solution Place 1 drop into both eyes at bedtime as needed (dry eyes).   Yes Historical Provider, MD  midodrine (PROAMATINE) 10 MG tablet Take 5 mg by mouth 3 (three) times a week. Monday, Wednesday and Friday   Yes Historical Provider, MD  multivitamin (RENA-VIT) TABS tablet Take 1 tablet by mouth daily.   Yes Historical Provider, MD  pantoprazole (PROTONIX) 40 MG tablet Take 1 tablet (40 mg total) by mouth daily. 10/29/13  Yes Amy D Clegg, NP  polyethylene glycol (MIRALAX / GLYCOLAX) packet Take 17 g by mouth daily as needed (constipation). 10/29/13  Yes Amy D Clegg, NP  promethazine (PHENERGAN) 25 MG tablet Take 1 tablet (25 mg total) by mouth every 8 (eight) hours as needed for nausea or vomiting. 12/11/13  Yes Susy Frizzle, MD  traMADol (ULTRAM) 50 MG tablet Take 1 tablet (50 mg total) by mouth every 6 (six) hours as needed for moderate pain. 01/07/14  Yes Alycia Rossetti, MD  ursodiol (ACTIGALL) 300 MG capsule Take 1 capsule (300 mg total) by mouth 2 (two) times daily. 11/29/13  Yes Ripudeep Krystal Eaton, MD  cephALEXin (KEFLEX) 500 MG capsule Take 500 mg by mouth 4 (four) times daily. Completed a week ago from today (01-28-14) 01/10/14   Susy Frizzle, MD   BP 98/67  Pulse 101  Temp(Src) 98.2 F (36.8 C)  Resp 12  SpO2 97% Physical Exam  Nursing note and vitals reviewed. Constitutional: She appears well-developed.  HENT:  Head: Atraumatic.  Neck: Neck supple.  Cardiovascular:  tachycardia  Pulmonary/Chest: Effort normal and breath sounds normal.  Abdominal:  Pt has erythema of the lower quadrants, with the wound in the RLQ showing a healing 4 cm area. There is some induration in the lower quadrant lesion and the gauze shows some yellow discharge, foul smelling.   Pt has tenderness to even mild palpation over the erythematous site.    ED  Course  Procedures (including critical care time) Labs Review Labs Reviewed  CBC WITH DIFFERENTIAL - Abnormal; Notable for the following:    RBC 3.67 (*)    Hemoglobin  11.0 (*)    HCT 35.3 (*)    RDW 19.3 (*)    Neutrophils Relative % 82 (*)    Lymphocytes Relative 9 (*)    Lymphs Abs 0.6 (*)    All other components within normal limits  COMPREHENSIVE METABOLIC PANEL - Abnormal; Notable for the following:    Sodium 135 (*)    Chloride 89 (*)    Glucose, Bld 309 (*)    BUN 38 (*)    Creatinine, Ser 3.90 (*)    Albumin 2.7 (*)    Alkaline Phosphatase 123 (*)    GFR calc non Af Amer 13 (*)    GFR calc Af Amer 15 (*)    Anion gap 17 (*)    All other components within normal limits  URINALYSIS, ROUTINE W REFLEX MICROSCOPIC - Abnormal; Notable for the following:    Color, Urine AMBER (*)    APPearance CLOUDY (*)    Glucose, UA >1000 (*)    Hgb urine dipstick MODERATE (*)    Ketones, ur 15 (*)    Protein, ur >300 (*)    All other components within normal limits  URINE MICROSCOPIC-ADD ON - Abnormal; Notable for the following:    Squamous Epithelial / LPF FEW (*)    Bacteria, UA FEW (*)    Casts HYALINE CASTS (*)    All other components within normal limits  I-STAT VENOUS BLOOD GAS, ED - Abnormal; Notable for the following:    pH, Ven 7.428 (*)    pCO2, Ven 56.2 (*)    pO2, Ven 18.0 (*)    Bicarbonate 37.2 (*)    Acid-Base Excess 11.0 (*)    All other components within normal limits  I-STAT CG4 LACTIC ACID, ED    Imaging Review Ct Abdomen Pelvis W Contrast  01/29/2014   CLINICAL DATA:  Lower quadrant abdominal pain.  Initial encounter.  EXAM: CT ABDOMEN AND PELVIS WITH CONTRAST  TECHNIQUE: Multidetector CT imaging of the abdomen and pelvis was performed using the standard protocol following bolus administration of intravenous contrast.  CONTRAST:  173mL OMNIPAQUE IOHEXOL 300 MG/ML  SOLN  COMPARISON:  11/25/2013  FINDINGS: BODY WALL: At the site of soft tissue defect in the  right lower quadrant/right groin, there is increased subcutaneous reticulation correlating with history of the erythema. Soft tissue density at the base of the defect has also increased, presumably scarring.No evidence of bowel fistulization or abscess.  LOWER CHEST: 12 mm nodule in the left lower lobe is unchanged compared recent imaging. This is likely the patient's treated lung cancer. Chronic, small bilateral pleural effusions.  ABDOMEN/PELVIS:  Liver: No focal abnormality.  Biliary: Cholelithiasis.  No definitive cholecystitis.  Pancreas: Unremarkable.  Spleen: Unremarkable.  Adrenals: Unremarkable.  Kidneys and ureters: 5.4 cm cyst/s exophytic from the interpolar left kidney are not significantly changed. There is fine mural calcification best visualized coronally.  Bladder: Unremarkable.  Reproductive: Unremarkable.  Bowel: No obstruction. Negative appendix.  Retroperitoneum: No mass or adenopathy.  Peritoneum: No ascites or pneumoperitoneum.  Vascular: Fusiform enlargement of the right common femoral artery to 15 mm. Diffuse atherosclerosis.  OSSEOUS: No acute abnormalities.  IMPRESSION: 1. Inflammatory changes around the right lower quadrant scar could reflect cellulitis. No soft tissue abscess. 2. Cholelithiasis. 3. 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 4. Chronic bilateral pleural effusions   Electronically Signed   By: Jorje Guild M.D.   On: 01/29/2014 03:55     EKG Interpretation None  MDM   Final diagnoses:  Cellulitis, abdominal wall  Hyperglycemia  ESRD (end stage renal disease) on dialysis    Pt comes in with abd pain. Bilateral lower quadrant pain mainly, and she has erythema with mild induration and a wound from recent admission. Pt has DM, and has had cellulitis in the past. Mild hyperglycemia with mild anion gap noted. There are trace ketones in the urine. Pt has tachycardia, no other SIRS.  Concerns for cellulitis, and possibly an  abscess. CT abd ordered  -and there is no abscess. Given significant co morbidities - will admit. Lucianne Lei given.  Pt to be given insulin subcutaneously, and mild hydration. Will not treat this as a DKA with insulin drip - as unlikely to be DKA.   Varney Biles, MD 01/29/14 [phone]

## 2014-01-29 NOTE — Progress Notes (Signed)
ANTIBIOTIC CONSULT NOTE - INITIAL  Pharmacy Consult for Vancocin and Zosyn Indication: cellulitis  Allergies  Allergen Reactions  . Crestor [Rosuvastatin] Other (See Comments)    Severe muscle weakness  . Nsaids Other (See Comments)    Not allergic, "bad on my kidneys"  . Ciprofloxacin Rash    Patient Measurements: Height: 5\' 7"  (170.2 cm) Weight: 258 lb (117.028 kg) IBW/kg (Calculated) : 61.6  Vital Signs: Temp: 98.2 F (36.8 C) (10/12 2111) BP: 109/87 mmHg (10/13 0608) Pulse Rate: 106 (10/13 0608)  Labs:  Recent Labs  01/28/14 2117  WBC 7.2  HGB 11.0*  PLT 226  CREATININE 3.90*   Estimated Creatinine Clearance: 24.1 ml/min (by C-G formula based on Cr of 3.9).  Medical History: Past Medical History  Diagnosis Date  . High cholesterol   . Diabetic retinopathy   . Peripheral neuropathy     "tips of toes"  . Blind right eye   . CHF (congestive heart failure)   . CAD (coronary artery disease)   . GERD (gastroesophageal reflux disease)   . Hypertension   . History of lung cancer 07/2011    s/p left lower lobectomy  . Diabetes mellitus     IDDM  . Cataract of right eye   . Ulcer of toe of right foot 07/10/2012    great toe  . Breast calcification, left 06/2012  . Acute biphenotypic leukemia   . CKD (chronic kidney disease) stage 5, GFR less than 15 ml/min   . Nephrotic syndrome   . Gastritis     H/o gastritis on prior endoscopy  . Anemia   . Carotid artery disease   . Gallstones   . Myocardial infarction 5/16    Medications:  Prescriptions prior to admission  Medication Sig Dispense Refill  . aspirin EC 81 MG EC tablet Take 1 tablet (81 mg total) by mouth daily.  30 tablet  0  . atorvastatin (LIPITOR) 80 MG tablet Take 1 tablet (80 mg total) by mouth daily at 6 PM.  30 tablet  6  . calcium acetate (PHOSLO) 667 MG capsule Take 2 capsules (1,334 mg total) by mouth 3 (three) times daily with meals.  90 capsule  0  . cyclobenzaprine (FLEXERIL) 10 MG  tablet Take 10 mg by mouth 3 (three) times daily as needed for muscle spasms.      . cyclobenzaprine (FLEXERIL) 10 MG tablet TAKE ONE TABLET BY MOUTH THREE TIMES DAILY AS NEEDED FOR MUSCLE SPASM  45 tablet  3  . HYDROcodone-acetaminophen (NORCO) 5-325 MG per tablet Take 1 tablet by mouth 3 (three) times daily as needed for moderate pain.  90 tablet  0  . Insulin Detemir (LEVEMIR FLEXPEN) 100 UNIT/ML Pen Inject 20 Units into the skin every evening.  15 mL  11  . insulin lispro (HUMALOG KWIKPEN) 100 UNIT/ML KiwkPen Inject 0.05 mLs (5 Units total) into the skin 3 (three) times daily with meals.  15 mL  6  . methylcellulose (ARTIFICIAL TEARS) 1 % ophthalmic solution Place 1 drop into both eyes at bedtime as needed (dry eyes).      . midodrine (PROAMATINE) 10 MG tablet Take 5 mg by mouth 3 (three) times a week. Monday, Wednesday and Friday      . multivitamin (RENA-VIT) TABS tablet Take 1 tablet by mouth daily.      . pantoprazole (PROTONIX) 40 MG tablet Take 1 tablet (40 mg total) by mouth daily.  30 tablet  6  . polyethylene glycol (MIRALAX /  GLYCOLAX) packet Take 17 g by mouth daily as needed (constipation).  14 each  6  . promethazine (PHENERGAN) 25 MG tablet Take 1 tablet (25 mg total) by mouth every 8 (eight) hours as needed for nausea or vomiting.  20 tablet  0  . traMADol (ULTRAM) 50 MG tablet Take 1 tablet (50 mg total) by mouth every 6 (six) hours as needed for moderate pain.  60 tablet  0  . ursodiol (ACTIGALL) 300 MG capsule Take 1 capsule (300 mg total) by mouth 2 (two) times daily.  60 capsule  4  . cephALEXin (KEFLEX) 500 MG capsule Take 500 mg by mouth 4 (four) times daily. Completed a week ago from today (01-28-14)       Scheduled:  . aspirin EC  81 mg Oral Daily  . atorvastatin  80 mg Oral q1800  . calcium acetate  1,334 mg Oral TID WC  . ceFEPime (MAXIPIME) IV  2 g Intravenous Q M,W,F-2000  . enoxaparin (LOVENOX) injection  30 mg Subcutaneous Q24H  . insulin aspart  0-9 Units  Subcutaneous TID WC  . insulin detemir  20 Units Subcutaneous QPM  . [START ON 01/30/2014] midodrine  5 mg Oral Once per day on Mon Wed Fri  . morphine      . multivitamin  1 tablet Oral Daily  . pantoprazole  40 mg Oral Daily  . sodium chloride  3 mL Intravenous Q12H  . ursodiol  300 mg Oral BID  . vancomycin  1,000 mg Intravenous Once  . [START ON 01/30/2014] vancomycin  1,000 mg Intravenous Q M,W,F-HD    Assessment: 43yo female c/o abdominal pain and nausea x2d, has erythematous region of lower abdomen w/ wound from recent admission, no abscess on CT, to begin IV ABX for cellulitis.  Goal of Therapy:  Pre-HD vanc level 15-25  Plan:  Rec'd vanc 1g in ED; will give additional vancomycin 1000mg  IV for total load of 2g then start vanc 1g after each HD as well as Zosyn 2.25g IV Q8H and monitor CBC, Cx, levels prn.  Wynona Neat, PharmD, BCPS  01/29/2014,7:51 AM

## 2014-01-29 NOTE — H&P (Signed)
[facility] History and Physical  Hannah [name] PZW:[id number] DOB: 1971-01-29 DOA: 01/29/2014  Referring physician: ER physician. PCP: Vic Blackbird, MD   Chief Complaint: Abdominal pain.  HPI: Hannah [name] is a 43 y.o. female history of diabetes mellitus type 1, ESRD on hemodialysis on Monday Wednesday and Friday, CAD and ischemic cardiomyopathy presents to the ER because of increasing abdominal pain over the last 4 days. Patient states abdominal pain started in the lower left quadrant which has gradually worsened over the last few days. Denies any fever chills. Patient has chronic nausea but denies any diarrhea. In the ER patient had CT abdomen and pelvis done which shows features concerning for possible cellulitis right lower quadrant. Patient has a chronic wound of right lower quadrant. Patient at this time has been admitted for IV antibiotics. Patient was admitted in May of this year and had cardiac catheter at that time and patient's hospital course was complicated with cardiogenic shock and also was further complicated with right lower extremity ischemia following which patient had femoral thrombectomy and fasciotomy. Patient states that this time her blood sugar has been running well and she has been compliant with her medications. Denies any chest pain or shortness of breath and states she has not missed her dialysis.   Review of Systems: As presented in the history of presenting illness, rest negative.  Past Medical History  Diagnosis Date  . High cholesterol   . Diabetic retinopathy   . Peripheral neuropathy     "tips of toes"  . Blind right eye   . CHF (congestive heart failure)   . CAD (coronary artery disease)   . GERD (gastroesophageal reflux disease)   . Hypertension   . History of lung cancer 07/2011    s/p left lower lobectomy  . Diabetes mellitus     IDDM  . Cataract of right eye   . Ulcer of toe of right foot 07/10/2012    great toe  . Breast  calcification, left 06/2012  . Acute biphenotypic leukemia   . CKD (chronic kidney disease) stage 5, GFR less than 15 ml/min   . Nephrotic syndrome   . Gastritis     H/o gastritis on prior endoscopy  . Anemia   . Carotid artery disease   . Gallstones   . Myocardial infarction 5/16   Past Surgical History  Procedure Laterality Date  . Cardiac catheterization  07/16/2011  . Incision and drainage breast abscess Left   . Tubal ligation  1994  . Vitrectomy  2010    2 on left, 1 on right  . Cesarean section  1991; 1994  . Video assisted thoracoscopy (vats)/ lobectomy Left 07/30/2011    left main thoracotomy, left lower lobectomy, mediastinal lymph node dissection  . Lobectomy    . Colonoscopy with esophagogastroduodenoscopy (egd) N/A 08/14/2012    Procedure: COLONOSCOPY WITH ESOPHAGOGASTRODUODENOSCOPY (EGD);  Surgeon: Danie Binder, MD;  [facility];  Service: Endoscopy;  Laterality: N/A;  10:45-moved to [address] to notify pt  . Breast lumpectomy with needle localization Left 11/14/2012    Procedure: BREAST LUMPECTOMY WITH NEEDLE LOCALIZATION;  Surgeon: Marcello Moores A. Cornett, MD;  Location: [facility];  Service: General;  Laterality: Left;  . Insertion of dialysis catheter Left 09/12/2013    Procedure: INSERTION OF DIALYSIS CATHETER;  Surgeon: Mal Misty, MD;  Location: [facility];  Service: Vascular;  Laterality: Left;  . Embolectomy Right 09/17/2013    Procedure: Thrombectomy of Right Common Femoral  Artery;  Surgeon: Elam Dutch, MD;  Location: [facility] OR;  Service: Vascular;  Laterality: Right;  . Endarterectomy femoral Right 09/17/2013    Procedure: Right Femoral Endarterectomy;  Surgeon: Elam Dutch, MD;  Location[facility];  Service: Vascular;  Laterality: Right;  . Patch angioplasty Right 09/17/2013    Procedure: Vein Patch Angioplasty of Right Femoral Artery;  Surgeon: Elam Dutch, MD;  Location: [place];  Service: Vascular;  Laterality: Right;  . Fasciotomy Right 09/17/2013     Procedure: Four Compartment Fasciotomy;  Surgeon: Elam Dutch, MD;  Location: [place];  Service: Vascular;  Laterality: Right;  . Fasciotomy closure Right 09/19/2013    Procedure: FASCIOTOMY CLOSURE;  Surgeon: Elam Dutch, MD;  Location: [place];  Service: Vascular;  Laterality: Right;  regional block and monitored anesthesia care used  . I&d extremity Right 09/28/2013    Procedure: IRRIGATION AND DEBRIDEMENT EXTREMITY;  Surgeon: Serafina Mitchell, MD;  Location: [place];  Service: Vascular;  Laterality: Right;  . Application of wound vac Right 09/28/2013    Procedure: APPLICATION OF WOUND VAC;  Surgeon: Serafina Mitchell, MD;  Location: [place];  Service: Vascular;  Laterality: Right;  . Av fistula placement Right 01/03/2014    Procedure: ARTERIOVENOUS (AV) FISTULA CREATION;  Surgeon: Angelia Mould, MD;  Location: [place];  Service: Vascular;  Laterality: Right;  . Ligation of arteriovenous  fistula Right 01/04/2014    Procedure: LIGATION OF ARTERIOVENOUS  FISTULA;  Surgeon: Rosetta Posner, MD;  Location: [place];  Service: Vascular;  Laterality: Right;   Social History:  reports that she quit smoking about 2 years ago. She has never used smokeless tobacco. She reports that she does not drink alcohol or use illicit drugs. Where does patient live home. Can patient participate in ADLs? Yes.  Allergies  Allergen Reactions  . Crestor [Rosuvastatin] Other (See Comments)    Severe muscle weakness  . Nsaids Other (See Comments)    Not allergic, "bad on my kidneys"  . Ciprofloxacin Rash    Family History:  Family History  Problem Relation Age of Onset  . Coronary artery disease Father   . Asthma Father   . COPD Father   . Hypertension Father   . Hyperlipidemia Father   . Diabetes Father   . Congestive Heart Failure Father   . Heart disease Father     before age 39  . Heart attack Father   . Peripheral vascular disease Father   . Hypertension Mother   . Hyperlipidemia Mother   . Diabetes  Mother   . Cancer Mother   . Cancer Maternal Aunt      three aunts, bone, breast, ?  . Hypertension Brother   . Diabetes Brother   . Diabetes Sister   . Colon cancer Neg Hx   . Celiac disease Neg Hx   . Crohn's disease Neg Hx   . Ulcerative colitis Neg Hx   . Lung cancer Maternal Grandmother       Prior to Admission medications   Medication Sig Start Date End Date Taking? Authorizing Provider  aspirin EC 81 MG EC tablet Take 1 tablet (81 mg total) by mouth daily. 08/17/13  Yes Belkys A Regalado, MD  atorvastatin (LIPITOR) 80 MG tablet Take 1 tablet (80 mg total) by mouth daily at 6 PM. 10/29/13  Yes Amy D Clegg, NP  calcium acetate (PHOSLO) 667 MG capsule Take 2 capsules (1,334 mg total) by mouth 3 (three)  times daily with meals. 11/10/13  Yes Hosie Poisson, MD  cyclobenzaprine (FLEXERIL) 10 MG tablet Take 10 mg by mouth 3 (three) times daily as needed for muscle spasms.   Yes Historical Provider, MD  cyclobenzaprine (FLEXERIL) 10 MG tablet TAKE ONE TABLET BY MOUTH THREE TIMES DAILY AS NEEDED FOR MUSCLE SPASM 01/22/14  Yes Alycia Rossetti, MD  HYDROcodone-acetaminophen (NORCO) 5-325 MG per tablet Take 1 tablet by mouth 3 (three) times daily as needed for moderate pain. 01/08/14  Yes Alycia Rossetti, MD  Insulin Detemir (LEVEMIR FLEXPEN) 100 UNIT/ML Pen Inject 20 Units into the skin every evening. 10/29/13  Yes Amy D Clegg, NP  insulin lispro (HUMALOG KWIKPEN) 100 UNIT/ML KiwkPen Inject 0.05 mLs (5 Units total) into the skin 3 (three) times daily with meals. 01/26/14  Yes Alycia Rossetti, MD  methylcellulose (ARTIFICIAL TEARS) 1 % ophthalmic solution Place 1 drop into both eyes at bedtime as needed (dry eyes).   Yes Historical Provider, MD  midodrine (PROAMATINE) 10 MG tablet Take 5 mg by mouth 3 (three) times a week. Monday, Wednesday and Friday   Yes Historical Provider, MD  multivitamin (RENA-VIT) TABS tablet Take 1 tablet by mouth daily.   Yes Historical Provider, MD  pantoprazole (PROTONIX)  40 MG tablet Take 1 tablet (40 mg total) by mouth daily. 10/29/13  Yes Amy D Clegg, NP  polyethylene glycol (MIRALAX / GLYCOLAX) packet Take 17 g by mouth daily as needed (constipation). 10/29/13  Yes Amy D Clegg, NP  promethazine (PHENERGAN) 25 MG tablet Take 1 tablet (25 mg total) by mouth every 8 (eight) hours as needed for nausea or vomiting. 12/11/13  Yes Susy Frizzle, MD  traMADol (ULTRAM) 50 MG tablet Take 1 tablet (50 mg total) by mouth every 6 (six) hours as needed for moderate pain. 01/07/14  Yes Alycia Rossetti, MD  ursodiol (ACTIGALL) 300 MG capsule Take 1 capsule (300 mg total) by mouth 2 (two) times daily. 11/29/13  Yes Ripudeep Krystal Eaton, MD  cephALEXin (KEFLEX) 500 MG capsule Take 500 mg by mouth 4 (four) times daily. Completed a week ago from today (01-28-14) 01/10/14   Susy Frizzle, MD    Physical Exam: Filed Vitals:   01/29/14 0315 01/29/14 0345 01/29/14 0400 01/29/14 0608  BP: 98/67 103/70 102/75 109/87  Pulse:  102 105 106  Temp:      Resp: 12 24 13    SpO2:  96% 97% 96%     General:  Well-developed well-nourished.  Eyes: Anicteric no pallor.   ENT: No discharge from ears eyes nose mouth.  Neck: No mass felt.  Cardiovascular: S1-S2 heard.  Respiratory: No rhonchi or crepitations.  Abdomen: Right lower quadrant has a wound with no active discharge on the dressing. Patient has mild tenderness in the both lower quadrants.  Skin: Mild erythema of the lower quadrants and lower extremities.  Musculoskeletal: No edema.  Psychiatric: Appears normal.  Neurologic: Alert and oriented to time place and person. Moves all extremities.  Labs on Admission:  Basic Metabolic Panel:  Recent Labs Lab 01/28/14 2117  NA 135*  K 4.0  CL 89*  CO2 29  GLUCOSE 309*  BUN 38*  CREATININE 3.90*  CALCIUM 8.5   Liver Function Tests:  Recent Labs Lab 01/28/14 2117  AST 22  ALT 16  ALKPHOS 123*  BILITOT 0.7  PROT 6.3  ALBUMIN 2.7*   No results found for this  basename: LIPASE, AMYLASE,  in the last 168 hours No results  found for this basename: AMMONIA,  in the last 168 hours CBC:  Recent Labs Lab 01/28/14 2117  WBC 7.2  NEUTROABS 6.0  HGB 11.0*  HCT 35.3*  MCV 96.2  PLT 226   Cardiac Enzymes: No results found for this basename: CKTOTAL, CKMB, CKMBINDEX, TROPONINI,  in the last 168 hours  BNP (last 3 results)  Recent Labs  08/13/13 1949 09/06/13 0740  PROBNP 34187.0* 28622.0*   CBG: No results found for this basename: GLUCAP,  in the last 168 hours  Radiological Exams on Admission: Ct Abdomen Pelvis W Contrast  01/29/2014   CLINICAL DATA:  Lower quadrant abdominal pain.  Initial encounter.  EXAM: CT ABDOMEN AND PELVIS WITH CONTRAST  TECHNIQUE: Multidetector CT imaging of the abdomen and pelvis was performed using the standard protocol following bolus administration of intravenous contrast.  CONTRAST:  198mL OMNIPAQUE IOHEXOL 300 MG/ML  SOLN  COMPARISON:  11/25/2013  FINDINGS: BODY WALL: At the site of soft tissue defect in the right lower quadrant/right groin, there is increased subcutaneous reticulation correlating with history of the erythema. Soft tissue density at the base of the defect has also increased, presumably scarring.No evidence of bowel fistulization or abscess.  LOWER CHEST: 12 mm nodule in the left lower lobe is unchanged compared recent imaging. This is likely the patient's treated lung cancer. Chronic, small bilateral pleural effusions.  ABDOMEN/PELVIS:  Liver: No focal abnormality.  Biliary: Cholelithiasis.  No definitive cholecystitis.  Pancreas: Unremarkable.  Spleen: Unremarkable.  Adrenals: Unremarkable.  Kidneys and ureters: 5.4 cm cyst/s exophytic from the interpolar left kidney are not significantly changed. There is fine mural calcification best visualized coronally.  Bladder: Unremarkable.  Reproductive: Unremarkable.  Bowel: No obstruction. Negative appendix.  Retroperitoneum: No mass or adenopathy.  Peritoneum:  No ascites or pneumoperitoneum.  Vascular: Fusiform enlargement of the right common femoral artery to 15 mm. Diffuse atherosclerosis.  OSSEOUS: No acute abnormalities.  IMPRESSION: 1. Inflammatory changes around the right lower quadrant scar could reflect cellulitis. No soft tissue abscess. 2. Cholelithiasis. 3. 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 4. Chronic bilateral pleural effusions   Electronically Signed   By: Jorje Guild M.D.   On: 01/29/2014 03:55     Assessment/Plan Principal Problem:   Abdominal pain Active Problems:   CAD- severe 3V CAD May 2015- turned down for CABG   ESRD (end stage renal disease) on dialysis   Cardiomyopathy, ischemic- EF 25-30% July 2015   Cellulitis   Diabetes mellitus type 2, uncontrolled   1. Abdominal pain with possible cellulitis - patient has been placed on empiric antibiotics. Patient does have chronic wound of the right lower quadrant. Closely observe for any worsening. Consult wound team. 2. Diabetes mellitus type 1 - patient has mild gap probably from chronic kidney disease. Closely follow CBGs with patient's home medication doses. 3. ESRD on hemodialysis Monday Wednesday and Friday - patient did receive some IV fluids in the ER. Closely follow her respiratory status. Dialysis per nephrologist. 4. Chronic anemia - probably from ESRD. 5. CAD and ischemic cardiomyopathy last EF measured was 25% - patient appears compensated at this time. 6. History of cardiac arrest and complicated hospital course in May through July of this year.    Code Status: Full code.  Family Communication: Family at the bedside.  Disposition Plan: Admit to inpatient.    KAKRAKANDY,ARSHAD N. [facility] Pager [phone].  If 7PM-7AM, please contact night-coverage [email or web address] Password [email or web address] 01/29/2014, 7:40 AM

## 2014-01-29 NOTE — Progress Notes (Signed)
Patient seen, admitted 3 hours ago. She is history of ESRD, right lower quadrant abdominal wound which is being followed by Dr. Oneida Alar in home health nurse. She was admitted for abdominal pain suspected from cellulitis in the right lower quadrant.  In my exam not convinced that patient has an acute infection, there is no discharge from that wound and her redness according to her and her husband is actually better, for now appropriate antibiotics will be continued for another 24 hours. Her pain more likely is due to moderate to large stool burden noticed on CT scan in my review. We'll place her on scheduled twice a day Colace, MiraLAX, SMO G. enema x1. Monitor closely.   ESRD. Informed Dr. Lorrene Reid due for dialysis tomorrow.

## 2014-01-30 DIAGNOSIS — I10 Essential (primary) hypertension: Secondary | ICD-10-CM

## 2014-01-30 LAB — BASIC METABOLIC PANEL
Anion gap: 19 — ABNORMAL HIGH (ref 5–15)
BUN: 62 mg/dL — AB (ref 6–23)
CO2: 24 meq/L (ref 19–32)
CREATININE: 5.78 mg/dL — AB (ref 0.50–1.10)
Calcium: 9.1 mg/dL (ref 8.4–10.5)
Chloride: 91 mEq/L — ABNORMAL LOW (ref 96–112)
GFR calc Af Amer: 9 mL/min — ABNORMAL LOW (ref >90)
GFR calc non Af Amer: 8 mL/min — ABNORMAL LOW (ref >90)
Glucose, Bld: 199 mg/dL — ABNORMAL HIGH (ref 70–99)
Potassium: 4.5 mEq/L (ref 3.7–5.3)
Sodium: 134 mEq/L — ABNORMAL LOW (ref 137–147)

## 2014-01-30 LAB — CBC
HEMATOCRIT: 34.1 % — AB (ref 36.0–46.0)
HEMOGLOBIN: 10.3 g/dL — AB (ref 12.0–15.0)
MCH: 29 pg (ref 26.0–34.0)
MCHC: 30.2 g/dL (ref 30.0–36.0)
MCV: 96.1 fL (ref 78.0–100.0)
Platelets: 221 10*3/uL (ref 150–400)
RBC: 3.55 MIL/uL — ABNORMAL LOW (ref 3.87–5.11)
RDW: 19.5 % — ABNORMAL HIGH (ref 11.5–15.5)
WBC: 5.6 10*3/uL (ref 4.0–10.5)

## 2014-01-30 LAB — GLUCOSE, CAPILLARY
GLUCOSE-CAPILLARY: 158 mg/dL — AB (ref 70–99)
GLUCOSE-CAPILLARY: 105 mg/dL — AB (ref 70–99)

## 2014-01-30 MED ORDER — LIDOCAINE HCL (PF) 1 % IJ SOLN: 5.0000 mL | INTRAMUSCULAR | Status: DC | PRN

## 2014-01-30 MED ORDER — PENTAFLUOROPROP-TETRAFLUOROETH EX AERO: 1.0000 "application " | INHALATION_SPRAY | CUTANEOUS | Status: DC | PRN

## 2014-01-30 MED ORDER — DARBEPOETIN ALFA-POLYSORBATE 100 MCG/0.5ML IJ SOLN
INTRAMUSCULAR | Status: AC
  Administered 2014-01-30: 100 ug via INTRAVENOUS
  Filled 2014-01-30: qty 0.5

## 2014-01-30 MED ORDER — MIDODRINE HCL 5 MG PO TABS
ORAL_TABLET | ORAL | Status: AC
  Administered 2014-01-30: 5 mg via ORAL
  Filled 2014-01-30: qty 1

## 2014-01-30 MED ORDER — HEPARIN SODIUM (PORCINE) 1000 UNIT/ML DIALYSIS
1000.0000 [IU] | INTRAMUSCULAR | Status: DC | PRN
  Filled 2014-01-30: qty 1

## 2014-01-30 MED ORDER — MORPHINE SULFATE 2 MG/ML IJ SOLN
INTRAMUSCULAR | Status: AC
  Filled 2014-01-30: qty 1

## 2014-01-30 MED ORDER — HEPARIN SODIUM (PORCINE) 1000 UNIT/ML DIALYSIS
5000.0000 [IU] | Freq: Once | INTRAMUSCULAR | Status: AC
Start: 2014-01-30 — End: 2014-01-30
  Administered 2014-01-30: 5000 [IU] via INTRAVENOUS_CENTRAL
  Filled 2014-01-30: qty 5

## 2014-01-30 MED ORDER — DOXYCYCLINE HYCLATE 100 MG PO TABS: 100.0000 mg | ORAL_TABLET | Freq: Two times a day (BID) | ORAL | Status: DC

## 2014-01-30 MED ORDER — ALTEPLASE 2 MG IJ SOLR
2.0000 mg | Freq: Once | INTRAMUSCULAR | Status: DC | PRN
  Filled 2014-01-30: qty 2

## 2014-01-30 MED ORDER — AMOXICILLIN-POT CLAVULANATE 875-125 MG PO TABS: 1.0000 | ORAL_TABLET | Freq: Two times a day (BID) | ORAL | Status: DC

## 2014-01-30 MED ORDER — SODIUM CHLORIDE 0.9 % IV SOLN: 100.0000 mL | INTRAVENOUS | Status: DC | PRN

## 2014-01-30 MED ORDER — DOXERCALCIFEROL 4 MCG/2ML IV SOLN
INTRAVENOUS | Status: AC
  Administered 2014-01-30: 5 ug via INTRAVENOUS
  Filled 2014-01-30: qty 4

## 2014-01-30 MED ORDER — DSS 100 MG PO CAPS: 200.0000 mg | ORAL_CAPSULE | Freq: Two times a day (BID) | ORAL | Status: DC

## 2014-01-30 MED ORDER — LIDOCAINE-PRILOCAINE 2.5-2.5 % EX CREA: 1.0000 "application " | TOPICAL_CREAM | CUTANEOUS | Status: DC | PRN

## 2014-01-30 MED ORDER — NEPRO/CARBSTEADY PO LIQD
237.0000 mL | ORAL | Status: DC | PRN
  Filled 2014-01-30: qty 237

## 2014-01-30 MED ORDER — HYDROCODONE-ACETAMINOPHEN 5-325 MG PO TABS: 1.0000 | ORAL_TABLET | Freq: Three times a day (TID) | ORAL | Status: DC | PRN

## 2014-01-30 NOTE — Discharge Summary (Signed)
Physician Discharge Summary  Hannah [name] PJK:[id number] DOB: 07-07-1970 DOA: 01/29/2014  PCP: Vic Blackbird, MD  Admit date: 01/29/2014 Discharge date: 01/30/2014  Time spent: >35 minutes  Recommendations for Outpatient Follow-up:  F/u with PCP in 1 week   Discharge Diagnoses:  Principal Problem:   Abdominal pain Active Problems:   CAD- severe 3V CAD May 2015- turned down for CABG   ESRD (end stage renal disease) on dialysis   Cardiomyopathy, ischemic- EF 25-30% July 2015   Cellulitis   Diabetes mellitus type 2, uncontrolled   Discharge Condition: stable   Diet recommendation: low sodium   Filed Weights   01/30/14 0500 01/30/14 0744 01/30/14 1206  Weight: 119.2 kg (262 lb 12.6 oz) 120 kg (264 lb 8.8 oz) 118 kg (260 lb 2.3 oz)    History of present illness:  43 y.o. female history of diabetes mellitus type 1, ESRD on hemodialysis on Monday Wednesday and Friday, CAD and ischemic cardiomyopathy presents to the ER because of increasing abdominal pain over the last 4 days. Patient states abdominal pain started in the lower left quadrant which has gradually worsened over the last few days. Denies any fever chills. Patient has chronic nausea but denies any diarrhea.  Hospital Course:  1. Abdominal pain with possible cellulitis; Patient does have chronic wound of the right lower quadrant. On exam unremarkable; no draining; no significant erythema; abd exam unremarkable;  -afebrile; no leukocytosis; cont PO atx, f/u in 1 week with PCP to reevaluate. -Pt also had constipation probable related to her abd pain; constipation resolved after enema; d/w patient, recommended to take bowel regimen while on vicodin  2. Diabetes mellitus type 1; resume home regimen, titrate insulin as needed as outpatient  3. ESRD on hemodialysis Monday Wednesday and Friday, cont outpatient HD -chronic leg edema, dermatitis; pleural effusions without respiratory compromise;  May need to increased HD to  remove more fluids  4. Chronic anemia - probably from ESRD. No s/sof acute bleeding;  5. CAD and ischemic cardiomyopathy last EF measured was 25% - patient appears compensated at this time. 6. History of cardiac arrest and complicated hospital course in May through July of this year. No chest pains  7. 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 Pulses palpable; h/o right femoral endarterectomy/thrombectomy/faciotomy and debridement in the past  -cont outpatient vascular surgery follow up in 1-2 weeks  Procedures:  HD (i.e. Studies not automatically included, echos, thoracentesis, etc; not x-rays)  Consultations:  Nephrology   Discharge Exam: Filed Vitals:   01/30/14 1237  BP: 125/77  Pulse: 114  Temp:   Resp: 16    General: alert Cardiovascular: s1,s2 rrr Respiratory: CTA BL  Discharge Instructions  Discharge Instructions   Diet - low sodium heart healthy    Complete by:  As directed      Discharge instructions    Complete by:  As directed   Please follow up with primary care doctor in 1 week     Increase activity slowly    Complete by:  As directed             Medication List    STOP taking these medications       cephALEXin 500 MG capsule  Commonly known as:  KEFLEX      TAKE these medications       amoxicillin-clavulanate 875-125 MG per tablet  Commonly known as:  AUGMENTIN  Take 1 tablet by mouth 2 (two) times daily.  aspirin 81 MG EC tablet  Take 1 tablet (81 mg total) by mouth daily.     atorvastatin 80 MG tablet  Commonly known as:  LIPITOR  Take 1 tablet (80 mg total) by mouth daily at 6 PM.     calcium acetate 667 MG capsule  Commonly known as:  PHOSLO  Take 2 capsules (1,334 mg total) by mouth 3 (three) times daily with meals.     cyclobenzaprine 10 MG tablet  Commonly known as:  FLEXERIL  Take 10 mg by mouth 3 (three) times daily as needed for muscle spasms.     cyclobenzaprine 10 MG tablet   Commonly known as:  FLEXERIL  TAKE ONE TABLET BY MOUTH THREE TIMES DAILY AS NEEDED FOR MUSCLE SPASM     doxycycline 100 MG tablet  Commonly known as:  VIBRA-TABS  Take 1 tablet (100 mg total) by mouth 2 (two) times daily.     DSS 100 MG Caps  Take 200 mg by mouth 2 (two) times daily.     HYDROcodone-acetaminophen 5-325 MG per tablet  Commonly known as:  NORCO  Take 1 tablet by mouth 3 (three) times daily as needed for moderate pain.     Insulin Detemir 100 UNIT/ML Pen  Commonly known as:  LEVEMIR FLEXPEN  Inject 20 Units into the skin every evening.     insulin lispro 100 UNIT/ML KiwkPen  Commonly known as:  HUMALOG KWIKPEN  Inject 0.05 mLs (5 Units total) into the skin 3 (three) times daily with meals.     methylcellulose 1 % ophthalmic solution  Commonly known as:  ARTIFICIAL TEARS  Place 1 drop into both eyes at bedtime as needed (dry eyes).     midodrine 10 MG tablet  Commonly known as:  PROAMATINE  Take 5 mg by mouth 3 (three) times a week. Monday, Wednesday and Friday     multivitamin Tabs tablet  Take 1 tablet by mouth daily.     pantoprazole 40 MG tablet  Commonly known as:  PROTONIX  Take 1 tablet (40 mg total) by mouth daily.     polyethylene glycol packet  Commonly known as:  MIRALAX / GLYCOLAX  Take 17 g by mouth daily as needed (constipation).     promethazine 25 MG tablet  Commonly known as:  PHENERGAN  Take 1 tablet (25 mg total) by mouth every 8 (eight) hours as needed for nausea or vomiting.     traMADol 50 MG tablet  Commonly known as:  ULTRAM  Take 1 tablet (50 mg total) by mouth every 6 (six) hours as needed for moderate pain.     ursodiol 300 MG capsule  Commonly known as:  ACTIGALL  Take 1 capsule (300 mg total) by mouth 2 (two) times daily.       Allergies  Allergen Reactions  . Crestor [Rosuvastatin] Other (See Comments)    Severe muscle weakness  . Nsaids Other (See Comments)    Not allergic, "bad on my kidneys"  . Ciprofloxacin  Rash       Follow-up Information   Follow up with Vic Blackbird, MD. Schedule an appointment as soon as possible for a visit in 1 week.   Specialty:  Family Medicine   Contact information:   [address] [place]  [address] [phone]        The results of significant diagnostics from this hospitalization (including imaging, microbiology, ancillary and laboratory) are listed below for reference.    Significant Diagnostic  Studies: Dg Chest 2 View  01/03/2014   CLINICAL DATA:  Preoperative prior to 80 fistula creation; history of end-stage renal disease, diabetes, and coronary artery disease ; history of long-term tobacco use now discontinued  EXAM: CHEST  2 VIEW  COMPARISON:  Portable chest x-ray of October 06, 2013  FINDINGS: The lungs are adequately inflated. The interstitial markings are mildly increased. There is a small left pleural effusion. The cardiac silhouette is mildly enlarged. The central pulmonary vascularity is engorged. A dual-lumen large caliber catheter is present via the left internal jugular approach with the distal tip overlying the midportion of the SVC. No acute abnormality of the bony thorax is demonstrated.  IMPRESSION: Findings are consistent with low-grade CHF. There is a small left pleural effusion and mild pulmonary interstitial edema bilaterally.   Electronically Signed   By: David  Martinique   On: 01/03/2014 08:45   Ct Abdomen Pelvis W Contrast  01/29/2014   CLINICAL DATA:  Lower quadrant abdominal pain.  Initial encounter.  EXAM: CT ABDOMEN AND PELVIS WITH CONTRAST  TECHNIQUE: Multidetector CT imaging of the abdomen and pelvis was performed using the standard protocol following bolus administration of intravenous contrast.  CONTRAST:  164mL OMNIPAQUE IOHEXOL 300 MG/ML  SOLN  COMPARISON:  11/25/2013  FINDINGS: BODY WALL: At the site of soft tissue defect in the right lower quadrant/right groin, there is increased subcutaneous reticulation correlating with  history of the erythema. Soft tissue density at the base of the defect has also increased, presumably scarring.No evidence of bowel fistulization or abscess.  LOWER CHEST: 12 mm nodule in the left lower lobe is unchanged compared recent imaging. This is likely the patient's treated lung cancer. Chronic, small bilateral pleural effusions.  ABDOMEN/PELVIS:  Liver: No focal abnormality.  Biliary: Cholelithiasis.  No definitive cholecystitis.  Pancreas: Unremarkable.  Spleen: Unremarkable.  Adrenals: Unremarkable.  Kidneys and ureters: 5.4 cm cyst/s exophytic from the interpolar left kidney are not significantly changed. There is fine mural calcification best visualized coronally.  Bladder: Unremarkable.  Reproductive: Unremarkable.  Bowel: No obstruction. Negative appendix.  Retroperitoneum: No mass or adenopathy.  Peritoneum: No ascites or pneumoperitoneum.  Vascular: Fusiform enlargement of the right common femoral artery to 15 mm. Diffuse atherosclerosis.  OSSEOUS: No acute abnormalities.  IMPRESSION: 1. Inflammatory changes around the right lower quadrant scar could reflect cellulitis. No soft tissue abscess. 2. Cholelithiasis. 3. 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 4. Chronic bilateral pleural effusions   Electronically Signed   By: Jorje Guild M.D.   On: 01/29/2014 03:55    Microbiology: No results found for this or any previous visit (from the past 240 hour(s)).   Labs: Basic Metabolic Panel:  Recent Labs Lab 01/28/14 2117 01/29/14 0826 01/30/14 0500  NA 135* 136* 134*  K 4.0 4.1 4.5  CL 89* 91* 91*  CO2 29 29 24   GLUCOSE 309* 238* 199*  BUN 38* 47* 62*  CREATININE 3.90* 4.54* 5.78*  CALCIUM 8.5 8.7 9.1   Liver Function Tests:  Recent Labs Lab 01/28/14 2117 01/29/14 0826  AST 22 18  ALT 16 15  ALKPHOS 123* 117  BILITOT 0.7 0.6  PROT 6.3 6.0  ALBUMIN 2.7* 2.6*   No results found for this basename: LIPASE, AMYLASE,  in the last 168  hours No results found for this basename: AMMONIA,  in the last 168 hours CBC:  Recent Labs Lab 01/28/14 2117 01/29/14 0826 01/30/14 0500  WBC 7.2 6.1 5.6  NEUTROABS  6.0 4.4  --   HGB 11.0* 10.2* 10.3*  HCT 35.3* 33.2* 34.1*  MCV 96.2 95.7 96.1  PLT 226 221 221   Cardiac Enzymes: No results found for this basename: CKTOTAL, CKMB, CKMBINDEX, TROPONINI,  in the last 168 hours BNP: BNP (last 3 results)  Recent Labs  08/13/13 1949 09/06/13 0740  PROBNP 34187.0* 28622.0*   CBG:  Recent Labs Lab 01/29/14 1221 01/29/14 1645 01/29/14 2144 01/29/14 2353 01/30/14 0726  GLUCAP 238* 283* 391* 358* 158*       Signed:  Rowe Clack N  [facility] 01/30/2014, 12:49 PM

## 2014-01-30 NOTE — Clinical Documentation Improvement (Signed)
PLEASE SPECIFY TYPE & ACUITY OF CHF Possible Clinical Conditions?  Chronic Systolic Congestive Heart Failure Chronic Diastolic Congestive Heart Failure Chronic Systolic & Diastolic Congestive Heart Failure Acute Systolic Congestive Heart Failure Acute Diastolic Congestive Heart Failure Acute Systolic & Diastolic Congestive Heart Failure Acute on Chronic Systolic Congestive Heart Failure Acute on Chronic Diastolic Congestive Heart Failure Acute on Chronic Systolic & Diastolic Congestive Heart Failure Other Condition Cannot Clinically Determine  Supporting Information: (As per notes) Pt has hx of CHF   Thank You, Alessandra Grout, RN, BSN, CCDS,Clinical Documentation Specialist:  [phone]  [phone]=[facility] [place]- Health Information Management

## 2014-01-30 NOTE — Progress Notes (Signed)
Discharge home. Home discharge instruction given, no questions verbalized. 

## 2014-01-30 NOTE — Progress Notes (Signed)
Subjective:  Seen on HD- still with LLQ pain- is having stools- taking BP on leg so likely is not accurate- can take BP on right arm- reading is 120's over 70's- no other complaints Objective Vital signs in last 24 hours: Filed Vitals:   01/30/14 0834 01/30/14 0902 01/30/14 0938 01/30/14 1002  BP: 197/87 190/80 174/65 173/87  Pulse: 105 103 98 103  Temp:      TempSrc:      Resp: 18 20 18 20   Height:      Weight:      SpO2:       Weight change: 2.172 kg (4 lb 12.6 oz)  Intake/Output Summary (Last 24 hours) at 01/30/14 1004 Last data filed at 01/29/14 1805  Gross per 24 hour  Intake    720 ml  Output    200 ml  Net    520 ml   Dialysis Orders: MWF @ [facility]  4 hrs 116 kg 2K/2.25Ca 400/A1.5 Heparin 5000 U L IJ catheter  Hectorol 5 mcg Aranesp 120 mcg & Venofer 50 mg on Wed.   Assessment/Plan:  1. LLQ pain - more likely sec to constipation? Is still painful but did improve s/p enema; RLQ wound healing, on IV Vancomycin & Zosyn. 2. RLQ & RLE wounds - S/p thromboembolectomy, fasciotomy, several debridements, antibiotics; almost healed according to pt. 3. ESRD - HD on MWF @ [place] via PC , K 4.1. HD today 4. Dialysis access - L IJ catheter; AVF @ [facility] placed 9/17, ligated 9/18 sec to steal, follow-up 10/29. 5. Hypertension/volume - BP low on Midodrine 5 mg pre-HD; wt 117 kg, usually gains 2-3 L and reaches UF goal. 6. Anemia - Hgb 10.3, Aranesp 120 mcg & Venofer 50 mg on Weds. 7. Metabolic bone disease - Ca 8.7 (9.8 corrected), last P 6.9, iPTH 449; Hectorol 5 mcg, Phoslo 3 with meals. 8. Nutrition - Alb 2.6, renal carb-mod diet, multivitamin. 9. DM Type 1 - with retinopathy, on insulin per primary. 10. CAD - 3V disease, no targets for revascularization per cath 08/2013. 11. Carotid stenosis - R 60-79%, L 80-99%; high grade left carotid stenosis for carotid stenting in the future (Dr. Trula Slade) 12. Failed RUE AVF d/t steal (ligated 12/2013) 13. 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 Oneida Alar) 57. S/p acute biliary colic 12/9240 resolved with medical management - high risk for surgery 15. Dispo- home once abdominal pain is better- no worrisome findings on CT      Mikaele Stecher A    Labs: Basic Metabolic Panel:  Recent Labs Lab 01/28/14 2117 01/29/14 0826 01/30/14 0500  NA 135* 136* 134*  K 4.0 4.1 4.5  CL 89* 91* 91*  CO2 29 29 24   GLUCOSE 309* 238* 199*  BUN 38* 47* 62*  CREATININE 3.90* 4.54* 5.78*  CALCIUM 8.5 8.7 9.1   Liver Function Tests:  Recent Labs Lab 01/28/14 2117 01/29/14 0826  AST 22 18  ALT 16 15  ALKPHOS 123* 117  BILITOT 0.7 0.6  PROT 6.3 6.0  ALBUMIN 2.7* 2.6*   No results found for this basename: LIPASE, AMYLASE,  in the last 168 hours No results found for this basename: AMMONIA,  in the last 168 hours CBC:  Recent Labs Lab 01/28/14 2117 01/29/14 0826 01/30/14 0500  WBC 7.2 6.1 5.6  NEUTROABS 6.0 4.4  --   HGB 11.0* 10.2* 10.3*  HCT 35.3* 33.2* 34.1*  MCV 96.2 95.7 96.1  PLT 226 221 221   Cardiac  Enzymes: No results found for this basename: CKTOTAL, CKMB, CKMBINDEX, TROPONINI,  in the last 168 hours CBG:  Recent Labs Lab 01/29/14 1221 01/29/14 1645 01/29/14 2144 01/29/14 2353 01/30/14 0726  GLUCAP 238* 283* 391* 358* 158*    Iron Studies: No results found for this basename: IRON, TIBC, TRANSFERRIN, FERRITIN,  in the last 72 hours Studies/Results: Ct Abdomen Pelvis W Contrast  01/29/2014   CLINICAL DATA:  Lower quadrant abdominal pain.  Initial encounter.  EXAM: CT ABDOMEN AND PELVIS WITH CONTRAST  TECHNIQUE: Multidetector CT imaging of the abdomen and pelvis was performed using the standard protocol following bolus administration of intravenous contrast.  CONTRAST:  147mL OMNIPAQUE IOHEXOL 300 MG/ML  SOLN  COMPARISON:  11/25/2013  FINDINGS: BODY WALL: At the site of soft tissue defect in the right lower quadrant/right groin, there is increased subcutaneous reticulation  correlating with history of the erythema. Soft tissue density at the base of the defect has also increased, presumably scarring.No evidence of bowel fistulization or abscess.  LOWER CHEST: 12 mm nodule in the left lower lobe is unchanged compared recent imaging. This is likely the patient's treated lung cancer. Chronic, small bilateral pleural effusions.  ABDOMEN/PELVIS:  Liver: No focal abnormality.  Biliary: Cholelithiasis.  No definitive cholecystitis.  Pancreas: Unremarkable.  Spleen: Unremarkable.  Adrenals: Unremarkable.  Kidneys and ureters: 5.4 cm cyst/s exophytic from the interpolar left kidney are not significantly changed. There is fine mural calcification best visualized coronally.  Bladder: Unremarkable.  Reproductive: Unremarkable.  Bowel: No obstruction. Negative appendix.  Retroperitoneum: No mass or adenopathy.  Peritoneum: No ascites or pneumoperitoneum.  Vascular: Fusiform enlargement of the right common femoral artery to 15 mm. Diffuse atherosclerosis.  OSSEOUS: No acute abnormalities.  IMPRESSION: 1. Inflammatory changes around the right lower quadrant scar could reflect cellulitis. No soft tissue abscess. 2. Cholelithiasis. 3. 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 4. Chronic bilateral pleural effusions   Electronically Signed   By: Jorje Guild M.D.   On: 01/29/2014 03:55   Medications: Infusions:    Scheduled Medications: . aspirin EC  81 mg Oral Daily  . atorvastatin  80 mg Oral q1800  . calcium acetate  1,334 mg Oral TID WC  . darbepoetin (ARANESP) injection - DIALYSIS  100 mcg Intravenous Q Wed-HD  . docusate sodium  200 mg Oral BID  . doxercalciferol  5 mcg Intravenous Q M,W,F-HD  . enoxaparin (LOVENOX) injection  30 mg Subcutaneous Q24H  . ferric gluconate (FERRLECIT/NULECIT) IV  62.5 mg Intravenous Weekly  . insulin aspart  0-9 Units Subcutaneous TID WC  . insulin detemir  20 Units Subcutaneous QPM  . midodrine  5 mg Oral Once per  day on Mon Wed Fri  . multivitamin  1 tablet Oral Daily  . pantoprazole  40 mg Oral Daily  . piperacillin-tazobactam (ZOSYN)  IV  2.25 g Intravenous 3 times per day  . polyethylene glycol  17 g Oral BID  . sodium chloride  3 mL Intravenous Q12H  . ursodiol  300 mg Oral BID  . vancomycin  1,000 mg Intravenous Q M,W,F-HD    have reviewed scheduled and prn medications.  Physical Exam: General: NAD- resting on HD Heart: RRR Lungs: mostly clear Abdomen: obese- still tender on right lower quadrant with little palpation Extremities: no obvious peripheral edema Dialysis Access: PC     01/30/2014,10:04 AM  LOS: 1 day

## 2014-01-30 NOTE — Procedures (Signed)
Patient was seen on dialysis and the procedure was supervised.  BFR 350  Via PC BP is  128/74.   Patient appears to be tolerating treatment well  Hannah [name] A 01/30/2014

## 2014-01-30 NOTE — Progress Notes (Signed)
Inpatient Diabetes Program Recommendations  AACE/ADA: New Consensus Statement on Inpatient Glycemic Control (2013)  Target Ranges:  Prepandial:   less than 140 mg/dL      Peak postprandial:   less than 180 mg/dL (1-2 hours)      Critically ill patients:  140 - 180 mg/dL   Reason for Assessment:  Results for Hannah [name], Hannah [name] (MRN [id number]) as of 01/30/2014 11:14  Ref. Range 01/29/2014 12:21 01/29/2014 16:45 01/29/2014 21:44 01/29/2014 23:53 01/30/2014 07:26  Glucose-Capillary Latest Range: 70-99 mg/dL 238 (H) 283 (H) 391 (H) 358 (H) 158 (H)   Diabetes history: Diabetes-Type 2 Outpatient Diabetes medications:  Levemir 20 units q evening, Humalog 5 units tid with meals Current orders for Inpatient glycemic control:  Levemir 20 units daily, Novolog sensitive tid with meals  Please consider adding Novolog meal coverage 4 units tid with meals while patient is in the hospital.   Thanks, Adah Perl, RN, BC-ADM Inpatient Diabetes Coordinator Pager [phone]

## 2014-01-30 NOTE — Discharge Instructions (Signed)
Please follow up with primary care doctor in 1 week

## 2014-02-02 ENCOUNTER — Encounter (HOSPITAL_COMMUNITY): Payer: Self-pay | Admitting: Emergency Medicine

## 2014-02-02 DIAGNOSIS — Z7982 Long term (current) use of aspirin: Secondary | ICD-10-CM

## 2014-02-02 DIAGNOSIS — I5042 Chronic combined systolic (congestive) and diastolic (congestive) heart failure: Secondary | ICD-10-CM | POA: Diagnosis present

## 2014-02-02 DIAGNOSIS — Z992 Dependence on renal dialysis: Secondary | ICD-10-CM

## 2014-02-02 DIAGNOSIS — N2581 Secondary hyperparathyroidism of renal origin: Secondary | ICD-10-CM | POA: Diagnosis present

## 2014-02-02 DIAGNOSIS — E780 Pure hypercholesterolemia: Secondary | ICD-10-CM | POA: Diagnosis present

## 2014-02-02 DIAGNOSIS — Z881 Allergy status to other antibiotic agents status: Secondary | ICD-10-CM

## 2014-02-02 DIAGNOSIS — K219 Gastro-esophageal reflux disease without esophagitis: Secondary | ICD-10-CM | POA: Diagnosis present

## 2014-02-02 DIAGNOSIS — L03311 Cellulitis of abdominal wall: Secondary | ICD-10-CM | POA: Diagnosis not present

## 2014-02-02 DIAGNOSIS — E785 Hyperlipidemia, unspecified: Secondary | ICD-10-CM | POA: Diagnosis present

## 2014-02-02 DIAGNOSIS — H5441 Blindness, right eye, normal vision left eye: Secondary | ICD-10-CM | POA: Diagnosis present

## 2014-02-02 DIAGNOSIS — Z801 Family history of malignant neoplasm of trachea, bronchus and lung: Secondary | ICD-10-CM

## 2014-02-02 DIAGNOSIS — Z825 Family history of asthma and other chronic lower respiratory diseases: Secondary | ICD-10-CM

## 2014-02-02 DIAGNOSIS — Z85118 Personal history of other malignant neoplasm of bronchus and lung: Secondary | ICD-10-CM

## 2014-02-02 DIAGNOSIS — Z888 Allergy status to other drugs, medicaments and biological substances status: Secondary | ICD-10-CM

## 2014-02-02 DIAGNOSIS — Z87891 Personal history of nicotine dependence: Secondary | ICD-10-CM

## 2014-02-02 DIAGNOSIS — Z794 Long term (current) use of insulin: Secondary | ICD-10-CM

## 2014-02-02 DIAGNOSIS — Z833 Family history of diabetes mellitus: Secondary | ICD-10-CM

## 2014-02-02 DIAGNOSIS — N186 End stage renal disease: Secondary | ICD-10-CM | POA: Diagnosis present

## 2014-02-02 DIAGNOSIS — D631 Anemia in chronic kidney disease: Secondary | ICD-10-CM | POA: Diagnosis present

## 2014-02-02 DIAGNOSIS — R1032 Left lower quadrant pain: Secondary | ICD-10-CM | POA: Diagnosis not present

## 2014-02-02 DIAGNOSIS — I2510 Atherosclerotic heart disease of native coronary artery without angina pectoris: Secondary | ICD-10-CM | POA: Diagnosis present

## 2014-02-02 DIAGNOSIS — I120 Hypertensive chronic kidney disease with stage 5 chronic kidney disease or end stage renal disease: Secondary | ICD-10-CM | POA: Diagnosis present

## 2014-02-02 DIAGNOSIS — I255 Ischemic cardiomyopathy: Secondary | ICD-10-CM | POA: Diagnosis present

## 2014-02-02 DIAGNOSIS — E11319 Type 2 diabetes mellitus with unspecified diabetic retinopathy without macular edema: Secondary | ICD-10-CM | POA: Diagnosis present

## 2014-02-02 DIAGNOSIS — Z886 Allergy status to analgesic agent status: Secondary | ICD-10-CM

## 2014-02-02 DIAGNOSIS — I959 Hypotension, unspecified: Secondary | ICD-10-CM | POA: Diagnosis present

## 2014-02-02 DIAGNOSIS — Z8249 Family history of ischemic heart disease and other diseases of the circulatory system: Secondary | ICD-10-CM

## 2014-02-02 DIAGNOSIS — I252 Old myocardial infarction: Secondary | ICD-10-CM

## 2014-02-02 DIAGNOSIS — Z79899 Other long term (current) drug therapy: Secondary | ICD-10-CM

## 2014-02-02 NOTE — ED Notes (Signed)
Pt reports LLQ pain since 10/8. Recently admitted for same, dx with cellulitis. States that since discharge on Wednesday that pain has continued to increase. Pain worse on palpation to area. Denies V/D. Denies fever/chills. Pt in NAD.

## 2014-02-03 ENCOUNTER — Inpatient Hospital Stay (HOSPITAL_COMMUNITY)
Admission: EM | Admit: 2014-02-03 | Discharge: 2014-02-04 | DRG: 602 | Disposition: A | Discharge status: Home / Self Care | Payer: Medicare Other | Attending: Internal Medicine | Admitting: Internal Medicine

## 2014-02-03 ENCOUNTER — Encounter (HOSPITAL_COMMUNITY): Payer: Self-pay | Admitting: *Deleted

## 2014-02-03 DIAGNOSIS — I2510 Atherosclerotic heart disease of native coronary artery without angina pectoris: Secondary | ICD-10-CM

## 2014-02-03 DIAGNOSIS — E1165 Type 2 diabetes mellitus with hyperglycemia: Secondary | ICD-10-CM

## 2014-02-03 DIAGNOSIS — N186 End stage renal disease: Secondary | ICD-10-CM | POA: Diagnosis present

## 2014-02-03 DIAGNOSIS — IMO0002 Reserved for concepts with insufficient information to code with codable children: Secondary | ICD-10-CM

## 2014-02-03 DIAGNOSIS — N2581 Secondary hyperparathyroidism of renal origin: Secondary | ICD-10-CM | POA: Diagnosis present

## 2014-02-03 DIAGNOSIS — Z85118 Personal history of other malignant neoplasm of bronchus and lung: Secondary | ICD-10-CM | POA: Diagnosis not present

## 2014-02-03 DIAGNOSIS — I255 Ischemic cardiomyopathy: Secondary | ICD-10-CM

## 2014-02-03 DIAGNOSIS — R1030 Lower abdominal pain, unspecified: Secondary | ICD-10-CM

## 2014-02-03 DIAGNOSIS — E113499 Type 2 diabetes mellitus with severe nonproliferative diabetic retinopathy without macular edema, unspecified eye: Secondary | ICD-10-CM

## 2014-02-03 DIAGNOSIS — I5042 Chronic combined systolic (congestive) and diastolic (congestive) heart failure: Secondary | ICD-10-CM | POA: Diagnosis present

## 2014-02-03 DIAGNOSIS — I472 Ventricular tachycardia, unspecified: Secondary | ICD-10-CM

## 2014-02-03 DIAGNOSIS — Z886 Allergy status to analgesic agent status: Secondary | ICD-10-CM | POA: Diagnosis not present

## 2014-02-03 DIAGNOSIS — Z833 Family history of diabetes mellitus: Secondary | ICD-10-CM | POA: Diagnosis not present

## 2014-02-03 DIAGNOSIS — Z801 Family history of malignant neoplasm of trachea, bronchus and lung: Secondary | ICD-10-CM | POA: Diagnosis not present

## 2014-02-03 DIAGNOSIS — Z8249 Family history of ischemic heart disease and other diseases of the circulatory system: Secondary | ICD-10-CM | POA: Diagnosis not present

## 2014-02-03 DIAGNOSIS — D631 Anemia in chronic kidney disease: Secondary | ICD-10-CM | POA: Diagnosis present

## 2014-02-03 DIAGNOSIS — Z992 Dependence on renal dialysis: Secondary | ICD-10-CM | POA: Diagnosis not present

## 2014-02-03 DIAGNOSIS — M79601 Pain in right arm: Secondary | ICD-10-CM

## 2014-02-03 DIAGNOSIS — K3184 Gastroparesis: Secondary | ICD-10-CM

## 2014-02-03 DIAGNOSIS — Z888 Allergy status to other drugs, medicaments and biological substances status: Secondary | ICD-10-CM | POA: Diagnosis not present

## 2014-02-03 DIAGNOSIS — Z87891 Personal history of nicotine dependence: Secondary | ICD-10-CM | POA: Diagnosis not present

## 2014-02-03 DIAGNOSIS — L0390 Cellulitis, unspecified: Secondary | ICD-10-CM | POA: Diagnosis present

## 2014-02-03 DIAGNOSIS — D649 Anemia, unspecified: Secondary | ICD-10-CM

## 2014-02-03 DIAGNOSIS — Z825 Family history of asthma and other chronic lower respiratory diseases: Secondary | ICD-10-CM | POA: Diagnosis not present

## 2014-02-03 DIAGNOSIS — G253 Myoclonus: Secondary | ICD-10-CM

## 2014-02-03 DIAGNOSIS — H5441 Blindness, right eye, normal vision left eye: Secondary | ICD-10-CM | POA: Diagnosis present

## 2014-02-03 DIAGNOSIS — I82A11 Acute embolism and thrombosis of right axillary vein: Secondary | ICD-10-CM

## 2014-02-03 DIAGNOSIS — I252 Old myocardial infarction: Secondary | ICD-10-CM | POA: Diagnosis not present

## 2014-02-03 DIAGNOSIS — Z79899 Other long term (current) drug therapy: Secondary | ICD-10-CM | POA: Diagnosis not present

## 2014-02-03 DIAGNOSIS — E11349 Type 2 diabetes mellitus with severe nonproliferative diabetic retinopathy without macular edema: Secondary | ICD-10-CM

## 2014-02-03 DIAGNOSIS — L03311 Cellulitis of abdominal wall: Secondary | ICD-10-CM

## 2014-02-03 DIAGNOSIS — E11319 Type 2 diabetes mellitus with unspecified diabetic retinopathy without macular edema: Secondary | ICD-10-CM | POA: Diagnosis present

## 2014-02-03 DIAGNOSIS — Z794 Long term (current) use of insulin: Secondary | ICD-10-CM | POA: Diagnosis not present

## 2014-02-03 DIAGNOSIS — Z5181 Encounter for therapeutic drug level monitoring: Secondary | ICD-10-CM

## 2014-02-03 DIAGNOSIS — L03115 Cellulitis of right lower limb: Secondary | ICD-10-CM

## 2014-02-03 DIAGNOSIS — E119 Type 2 diabetes mellitus without complications: Secondary | ICD-10-CM

## 2014-02-03 DIAGNOSIS — Z881 Allergy status to other antibiotic agents status: Secondary | ICD-10-CM | POA: Diagnosis not present

## 2014-02-03 DIAGNOSIS — D638 Anemia in other chronic diseases classified elsewhere: Secondary | ICD-10-CM

## 2014-02-03 DIAGNOSIS — I10 Essential (primary) hypertension: Secondary | ICD-10-CM

## 2014-02-03 DIAGNOSIS — I959 Hypotension, unspecified: Secondary | ICD-10-CM | POA: Diagnosis present

## 2014-02-03 DIAGNOSIS — R1032 Left lower quadrant pain: Secondary | ICD-10-CM | POA: Diagnosis present

## 2014-02-03 DIAGNOSIS — K219 Gastro-esophageal reflux disease without esophagitis: Secondary | ICD-10-CM | POA: Diagnosis present

## 2014-02-03 DIAGNOSIS — E785 Hyperlipidemia, unspecified: Secondary | ICD-10-CM | POA: Diagnosis present

## 2014-02-03 DIAGNOSIS — I120 Hypertensive chronic kidney disease with stage 5 chronic kidney disease or end stage renal disease: Secondary | ICD-10-CM | POA: Diagnosis present

## 2014-02-03 DIAGNOSIS — I679 Cerebrovascular disease, unspecified: Secondary | ICD-10-CM

## 2014-02-03 DIAGNOSIS — Z7982 Long term (current) use of aspirin: Secondary | ICD-10-CM | POA: Diagnosis not present

## 2014-02-03 DIAGNOSIS — I739 Peripheral vascular disease, unspecified: Secondary | ICD-10-CM

## 2014-02-03 DIAGNOSIS — F17201 Nicotine dependence, unspecified, in remission: Secondary | ICD-10-CM

## 2014-02-03 DIAGNOSIS — E780 Pure hypercholesterolemia: Secondary | ICD-10-CM | POA: Diagnosis present

## 2014-02-03 DIAGNOSIS — I872 Venous insufficiency (chronic) (peripheral): Secondary | ICD-10-CM

## 2014-02-03 LAB — CBC WITH DIFFERENTIAL/PLATELET
Basophils Absolute: 0.1 10*3/uL (ref 0.0–0.1)
Basophils Relative: 1 % (ref 0–1)
EOS PCT: 5 % (ref 0–5)
Eosinophils Absolute: 0.4 10*3/uL (ref 0.0–0.7)
HEMATOCRIT: 37.5 % (ref 36.0–46.0)
HEMOGLOBIN: 11.3 g/dL — AB (ref 12.0–15.0)
LYMPHS ABS: 1.2 10*3/uL (ref 0.7–4.0)
Lymphocytes Relative: 17 % (ref 12–46)
MCH: 30.4 pg (ref 26.0–34.0)
MCHC: 30.1 g/dL (ref 30.0–36.0)
MCV: 100.8 fL — AB (ref 78.0–100.0)
MONO ABS: 0.4 10*3/uL (ref 0.1–1.0)
MONOS PCT: 6 % (ref 3–12)
Neutro Abs: 5.1 10*3/uL (ref 1.7–7.7)
Neutrophils Relative %: 71 % (ref 43–77)
Platelets: 245 10*3/uL (ref 150–400)
RBC: 3.72 MIL/uL — AB (ref 3.87–5.11)
RDW: 19.8 % — ABNORMAL HIGH (ref 11.5–15.5)
WBC: 7.1 10*3/uL (ref 4.0–10.5)

## 2014-02-03 LAB — COMPREHENSIVE METABOLIC PANEL
ALT: 13 U/L (ref 0–35)
AST: 16 U/L (ref 0–37)
Albumin: 2.5 g/dL — ABNORMAL LOW (ref 3.5–5.2)
Alkaline Phosphatase: 117 U/L (ref 39–117)
Anion gap: 14 (ref 5–15)
BILIRUBIN TOTAL: 0.5 mg/dL (ref 0.3–1.2)
BUN: 56 mg/dL — ABNORMAL HIGH (ref 6–23)
CALCIUM: 10.3 mg/dL (ref 8.4–10.5)
CO2: 28 meq/L (ref 19–32)
CREATININE: 5.56 mg/dL — AB (ref 0.50–1.10)
Chloride: 97 mEq/L (ref 96–112)
GFR calc Af Amer: 10 mL/min — ABNORMAL LOW (ref >90)
GFR, EST NON AFRICAN AMERICAN: 8 mL/min — AB (ref >90)
GLUCOSE: 117 mg/dL — AB (ref 70–99)
Potassium: 4.3 mEq/L (ref 3.7–5.3)
Sodium: 139 mEq/L (ref 137–147)
Total Protein: 6.4 g/dL (ref 6.0–8.3)

## 2014-02-03 LAB — CBC
HEMATOCRIT: 35.8 % — AB (ref 36.0–46.0)
Hemoglobin: 11 g/dL — ABNORMAL LOW (ref 12.0–15.0)
MCH: 29.9 pg (ref 26.0–34.0)
MCHC: 30.7 g/dL (ref 30.0–36.0)
MCV: 97.3 fL (ref 78.0–100.0)
Platelets: 255 10*3/uL (ref 150–400)
RBC: 3.68 MIL/uL — ABNORMAL LOW (ref 3.87–5.11)
RDW: 19.8 % — ABNORMAL HIGH (ref 11.5–15.5)
WBC: 6.9 10*3/uL (ref 4.0–10.5)

## 2014-02-03 LAB — BASIC METABOLIC PANEL
ANION GAP: 16 — AB (ref 5–15)
BUN: 58 mg/dL — ABNORMAL HIGH (ref 6–23)
CHLORIDE: 96 meq/L (ref 96–112)
CO2: 25 meq/L (ref 19–32)
Calcium: 9.9 mg/dL (ref 8.4–10.5)
Creatinine, Ser: 5.77 mg/dL — ABNORMAL HIGH (ref 0.50–1.10)
GFR calc Af Amer: 9 mL/min — ABNORMAL LOW (ref >90)
GFR calc non Af Amer: 8 mL/min — ABNORMAL LOW (ref >90)
Glucose, Bld: 123 mg/dL — ABNORMAL HIGH (ref 70–99)
Potassium: 4.3 mEq/L (ref 3.7–5.3)
SODIUM: 137 meq/L (ref 137–147)

## 2014-02-03 LAB — LIPASE, BLOOD: LIPASE: 12 U/L (ref 11–59)

## 2014-02-03 LAB — URINALYSIS, ROUTINE W REFLEX MICROSCOPIC
Bilirubin Urine: NEGATIVE
Hgb urine dipstick: NEGATIVE
KETONES UR: NEGATIVE mg/dL
Leukocytes, UA: NEGATIVE
Nitrite: NEGATIVE
PROTEIN: NEGATIVE mg/dL
Specific Gravity, Urine: 1.036 — ABNORMAL HIGH (ref 1.005–1.030)
Urobilinogen, UA: 0.2 mg/dL (ref 0.0–1.0)
pH: 5.5 (ref 5.0–8.0)

## 2014-02-03 LAB — MRSA PCR SCREENING: MRSA by PCR: NEGATIVE

## 2014-02-03 LAB — GLUCOSE, CAPILLARY
GLUCOSE-CAPILLARY: 243 mg/dL — AB (ref 70–99)
Glucose-Capillary: 285 mg/dL — ABNORMAL HIGH (ref 70–99)
Glucose-Capillary: 301 mg/dL — ABNORMAL HIGH (ref 70–99)

## 2014-02-03 LAB — URINE MICROSCOPIC-ADD ON

## 2014-02-03 MED ORDER — OXYCODONE HCL 5 MG PO TABS
5.0000 mg | ORAL_TABLET | ORAL | Status: DC | PRN
  Administered 2014-02-04: 5 mg via ORAL
  Filled 2014-02-03 (×2): qty 1

## 2014-02-03 MED ORDER — PANTOPRAZOLE SODIUM 40 MG PO TBEC
40.0000 mg | DELAYED_RELEASE_TABLET | Freq: Every day | ORAL | Status: DC
  Administered 2014-02-03 – 2014-02-04 (×2): 40 mg via ORAL
  Filled 2014-02-03 (×2): qty 1

## 2014-02-03 MED ORDER — SODIUM CHLORIDE 0.9 % IJ SOLN: 3.0000 mL | INTRAMUSCULAR | Status: DC | PRN

## 2014-02-03 MED ORDER — MIDODRINE HCL 5 MG PO TABS
5.0000 mg | ORAL_TABLET | ORAL | Status: DC
  Administered 2014-02-04: 5 mg via ORAL
  Filled 2014-02-03: qty 1

## 2014-02-03 MED ORDER — VANCOMYCIN HCL IN DEXTROSE 1-5 GM/200ML-% IV SOLN
1000.0000 mg | Freq: Once | INTRAVENOUS | Status: AC
  Administered 2014-02-03: 1000 mg via INTRAVENOUS
  Filled 2014-02-03: qty 200

## 2014-02-03 MED ORDER — ATORVASTATIN CALCIUM 80 MG PO TABS
80.0000 mg | ORAL_TABLET | Freq: Every day | ORAL | Status: DC
  Administered 2014-02-03: 80 mg via ORAL
  Filled 2014-02-03 (×2): qty 1

## 2014-02-03 MED ORDER — RENA-VITE PO TABS
1.0000 | ORAL_TABLET | Freq: Every day | ORAL | Status: DC
  Administered 2014-02-03: 1 via ORAL
  Filled 2014-02-03 (×2): qty 1

## 2014-02-03 MED ORDER — ACETAMINOPHEN 325 MG PO TABS
650.0000 mg | ORAL_TABLET | Freq: Four times a day (QID) | ORAL | Status: DC | PRN
Start: 2014-02-03 — End: 2014-02-04

## 2014-02-03 MED ORDER — POLYETHYLENE GLYCOL 3350 17 G PO PACK
17.0000 g | PACK | Freq: Every day | ORAL | Status: DC | PRN
  Filled 2014-02-03: qty 1

## 2014-02-03 MED ORDER — ONDANSETRON HCL 4 MG/2ML IJ SOLN: 4.0000 mg | Freq: Three times a day (TID) | INTRAMUSCULAR | Status: DC | PRN

## 2014-02-03 MED ORDER — ACETAMINOPHEN 650 MG RE SUPP: 650.0000 mg | Freq: Four times a day (QID) | RECTAL | Status: DC | PRN

## 2014-02-03 MED ORDER — ONDANSETRON HCL 4 MG/2ML IJ SOLN: 4.0000 mg | Freq: Four times a day (QID) | INTRAMUSCULAR | Status: DC | PRN

## 2014-02-03 MED ORDER — ENOXAPARIN SODIUM 30 MG/0.3ML ~~LOC~~ SOLN
30.0000 mg | SUBCUTANEOUS | Status: DC
  Administered 2014-02-03 – 2014-02-04 (×2): 30 mg via SUBCUTANEOUS
  Filled 2014-02-03 (×2): qty 0.3

## 2014-02-03 MED ORDER — SODIUM CHLORIDE 0.9 % IJ SOLN
3.0000 mL | Freq: Two times a day (BID) | INTRAMUSCULAR | Status: DC
  Administered 2014-02-03 (×3): 3 mL via INTRAVENOUS

## 2014-02-03 MED ORDER — HYDROMORPHONE HCL 1 MG/ML IJ SOLN: 1.0000 mg | INTRAMUSCULAR | Status: DC | PRN

## 2014-02-03 MED ORDER — SODIUM CHLORIDE 0.9 % IV SOLN: 250.0000 mL | INTRAVENOUS | Status: DC | PRN

## 2014-02-03 MED ORDER — INSULIN DETEMIR 100 UNIT/ML ~~LOC~~ SOLN
20.0000 [IU] | Freq: Every evening | SUBCUTANEOUS | Status: DC
  Administered 2014-02-03: 20 [IU] via SUBCUTANEOUS
  Filled 2014-02-03 (×3): qty 0.2

## 2014-02-03 MED ORDER — CYCLOBENZAPRINE HCL 10 MG PO TABS: 10.0000 mg | ORAL_TABLET | Freq: Three times a day (TID) | ORAL | Status: DC | PRN

## 2014-02-03 MED ORDER — HYDROMORPHONE HCL 1 MG/ML IJ SOLN
1.0000 mg | Freq: Once | INTRAMUSCULAR | Status: AC
  Administered 2014-02-03: 1 mg via INTRAVENOUS
  Filled 2014-02-03: qty 1

## 2014-02-03 MED ORDER — ONDANSETRON HCL 4 MG PO TABS: 4.0000 mg | ORAL_TABLET | Freq: Four times a day (QID) | ORAL | Status: DC | PRN

## 2014-02-03 MED ORDER — VANCOMYCIN HCL IN DEXTROSE 1-5 GM/200ML-% IV SOLN
1000.0000 mg | INTRAVENOUS | Status: DC
  Administered 2014-02-04: 1000 mg via INTRAVENOUS
  Filled 2014-02-03 (×2): qty 200

## 2014-02-03 MED ORDER — ASPIRIN EC 81 MG PO TBEC
81.0000 mg | DELAYED_RELEASE_TABLET | Freq: Every day | ORAL | Status: DC
  Administered 2014-02-03 – 2014-02-04 (×2): 81 mg via ORAL
  Filled 2014-02-03 (×2): qty 1

## 2014-02-03 MED ORDER — INSULIN ASPART 100 UNIT/ML ~~LOC~~ SOLN
0.0000 [IU] | Freq: Three times a day (TID) | SUBCUTANEOUS | Status: DC
  Administered 2014-02-03: 5 [IU] via SUBCUTANEOUS
  Administered 2014-02-03: 3 [IU] via SUBCUTANEOUS
  Administered 2014-02-04: 1 [IU] via SUBCUTANEOUS
  Administered 2014-02-04: 2 [IU] via SUBCUTANEOUS

## 2014-02-03 MED ORDER — URSODIOL 300 MG PO CAPS
300.0000 mg | ORAL_CAPSULE | Freq: Two times a day (BID) | ORAL | Status: DC
  Administered 2014-02-03 – 2014-02-04 (×3): 300 mg via ORAL
  Filled 2014-02-03 (×4): qty 1

## 2014-02-03 MED ORDER — INSULIN ASPART 100 UNIT/ML ~~LOC~~ SOLN
0.0000 [IU] | Freq: Every day | SUBCUTANEOUS | Status: DC
Start: 2014-02-03 — End: 2014-02-04
  Administered 2014-02-03: 4 [IU] via SUBCUTANEOUS

## 2014-02-03 MED ORDER — PIPERACILLIN-TAZOBACTAM IN DEX 2-0.25 GM/50ML IV SOLN
2.2500 g | Freq: Three times a day (TID) | INTRAVENOUS | Status: DC
  Administered 2014-02-03 – 2014-02-04 (×4): 2.25 g via INTRAVENOUS
  Filled 2014-02-03 (×6): qty 50

## 2014-02-03 MED ORDER — ONDANSETRON HCL 4 MG/2ML IJ SOLN
4.0000 mg | Freq: Once | INTRAMUSCULAR | Status: AC
  Administered 2014-02-03: 4 mg via INTRAVENOUS
  Filled 2014-02-03: qty 2

## 2014-02-03 MED ORDER — CALCIUM ACETATE 667 MG PO CAPS
1334.0000 mg | ORAL_CAPSULE | Freq: Three times a day (TID) | ORAL | Status: DC
  Administered 2014-02-03 – 2014-02-04 (×5): 1334 mg via ORAL
  Filled 2014-02-03 (×7): qty 2

## 2014-02-03 MED ORDER — HYDROMORPHONE HCL 1 MG/ML IJ SOLN
0.5000 mg | INTRAMUSCULAR | Status: DC | PRN
  Administered 2014-02-03 – 2014-02-04 (×6): 1 mg via INTRAVENOUS
  Filled 2014-02-03 (×6): qty 1

## 2014-02-03 NOTE — Progress Notes (Signed)
New Admission Note:   Arrival Method: stretcher from ED Mental Orientation: alert and oriented x4 Telemetry: N/A Assessment: Completed Skin:Pls see flowsheet IV:NSL-Left hand Pain: Denies Tubes: N/A Safety Measures: Safety Fall Prevention Plan has been given, discussed. Admission: Completed [facility] Orientation: Patient has been orientated to the room, unit and staff.  Family:  Orders have been reviewed and implemented. Will continue to monitor the patient. Call light has been placed within reach and bed alarm has been activated.   [employer], RN-BC Phone number: [phone]

## 2014-02-03 NOTE — H&P (Signed)
[facility] Admission History and Physical       Hannah [name] UXL:[id number] DOB: 1970-06-16 DOA: 02/03/2014  Referring physician:  EDP PCP: Vic Blackbird, MD  Specialists:   Chief Complaint: Increased Redness and Pain of lower Abdominal Area  HPI: Hannah [name] is a 43 y.o. female with history of ESRD on HD, CAD, DM2, HTN who was recently discharged from the hospital 3 days ago to continue on Augmentin and Doxycycline Rx who presents due to increased pain and redness of the  Lower ABD.   She denies any fevers or chills.   She had increased redness now for a total of 10 days, and was referred for admission due to failure of outpatient rx for her [employer]   Review of Systems:  Constitutional: No Weight Loss, No Weight Gain, Night Sweats, Fevers, Chills, Dizziness, Fatigue, or Generalized Weakness HEENT: No Headaches, Difficulty Swallowing,Tooth/Dental Problems,Sore Throat,  No Sneezing, Rhinitis, Ear Ache, Nasal Congestion, or Post Nasal Drip,  Cardio-vascular:  No Chest pain, Orthopnea, PND, Edema in Lower Extremities, Anasarca, Dizziness, Palpitations  Resp: No Dyspnea, No DOE, No Cough, No Hemoptysis, No Wheezing.    GI: No Heartburn, Indigestion, Abdominal Pain, Nausea, Vomiting, Diarrhea, Hematemesis, Hematochezia, Melena, Change in Bowel Habits,  Loss of Appetite  GU: No Dysuria, Change in Color of Urine, No Urgency or Frequency, No Flank pain.  Musculoskeletal: No Joint Pain or Swelling, No Decreased Range of Motion, No Back Pain.  Neurologic: No Syncope, No Seizures, Muscle Weakness, Paresthesia, Vision Disturbance or Loss, No Diplopia, No Vertigo, No Difficulty Walking,  Skin:   +Redness of Lower Left ABD,  Healing Ulcers on Right Lower Leg. Psych: No Change in Mood or Affect, No Depression or Anxiety, No Memory loss, No Confusion, or Hallucinations   Past Medical History  Diagnosis Date  . High cholesterol   . Diabetic retinopathy   . Peripheral  neuropathy     "tips of toes"  . Blind right eye   . CHF (congestive heart failure)   . CAD (coronary artery disease)   . GERD (gastroesophageal reflux disease)   . Hypertension   . History of lung cancer 07/2011    s/p left lower lobectomy  . Diabetes mellitus     IDDM  . Cataract of right eye   . Ulcer of toe of right foot 07/10/2012    great toe  . Breast calcification, left 06/2012  . Acute biphenotypic leukemia   . CKD (chronic kidney disease) stage 5, GFR less than 15 ml/min   . Nephrotic syndrome   . Gastritis     H/o gastritis on prior endoscopy  . Anemia   . Carotid artery disease   . Gallstones   . Myocardial infarction 5/16      Past Surgical History  Procedure Laterality Date  . Cardiac catheterization  07/16/2011  . Incision and drainage breast abscess Left   . Tubal ligation  1994  . Vitrectomy  2010    2 on left, 1 on right  . Cesarean section  1991; 1994  . Video assisted thoracoscopy (vats)/ lobectomy Left 07/30/2011    left main thoracotomy, left lower lobectomy, mediastinal lymph node dissection  . Lobectomy    . Colonoscopy with esophagogastroduodenoscopy (egd) N/A 08/14/2012    Procedure: COLONOSCOPY WITH ESOPHAGOGASTRODUODENOSCOPY (EGD);  Surgeon: Danie Binder, MD;  [facility];  Service: Endoscopy;  Laterality: N/A;  10:45-moved to [address] to notify pt  . Breast lumpectomy with needle localization  Left 11/14/2012    Procedure: BREAST LUMPECTOMY WITH NEEDLE LOCALIZATION;  Surgeon: Marcello Moores A. Cornett, MD;  Location: [place];  Service: General;  Laterality: Left;  . Insertion of dialysis catheter Left 09/12/2013    Procedure: INSERTION OF DIALYSIS CATHETER;  Surgeon: Mal Misty, MD;  Location: [place];  Service: Vascular;  Laterality: Left;  . Embolectomy Right 09/17/2013    Procedure: Thrombectomy of Right Common Femoral Artery;  Surgeon: Elam Dutch, MD;  Location: [facility] OR;  Service: Vascular;  Laterality: Right;  . Endarterectomy  femoral Right 09/17/2013    Procedure: Right Femoral Endarterectomy;  Surgeon: Elam Dutch, MD;  Location[facility] OR;  Service: Vascular;  Laterality: Right;  . Patch angioplasty Right 09/17/2013    Procedure: Vein Patch Angioplasty of Right Femoral Artery;  Surgeon: Elam Dutch, MD;  Location: [place];  Service: Vascular;  Laterality: Right;  . Fasciotomy Right 09/17/2013    Procedure: Four Compartment Fasciotomy;  Surgeon: Elam Dutch, MD;  Location: [place];  Service: Vascular;  Laterality: Right;  . Fasciotomy closure Right 09/19/2013    Procedure: FASCIOTOMY CLOSURE;  Surgeon: Elam Dutch, MD;  Location: [place];  Service: Vascular;  Laterality: Right;  regional block and monitored anesthesia care used  . I&d extremity Right 09/28/2013    Procedure: IRRIGATION AND DEBRIDEMENT EXTREMITY;  Surgeon: Serafina Mitchell, MD;  Location: [place];  Service: Vascular;  Laterality: Right;  . Application of wound vac Right 09/28/2013    Procedure: APPLICATION OF WOUND VAC;  Surgeon: Serafina Mitchell, MD;  Location: [place];  Service: Vascular;  Laterality: Right;  . Av fistula placement Right 01/03/2014    Procedure: ARTERIOVENOUS (AV) FISTULA CREATION;  Surgeon: Angelia Mould, MD;  Location: [place];  Service: Vascular;  Laterality: Right;  . Ligation of arteriovenous  fistula Right 01/04/2014    Procedure: LIGATION OF ARTERIOVENOUS  FISTULA;  Surgeon: Rosetta Posner, MD;  Location: [place];  Service: Vascular;  Laterality: Right;       Prior to Admission medications   Medication Sig Start Date End Date Taking? Authorizing Provider  amoxicillin-clavulanate (AUGMENTIN) 875-125 MG per tablet Take 1 tablet by mouth 2 (two) times daily. 01/30/14  Yes Kinnie Feil, MD  aspirin EC 81 MG EC tablet Take 1 tablet (81 mg total) by mouth daily. 08/17/13  Yes Belkys A Regalado, MD  atorvastatin (LIPITOR) 80 MG tablet Take 1 tablet (80 mg total) by mouth daily at 6 PM. 10/29/13  Yes Amy D Clegg, NP  calcium acetate  (PHOSLO) 667 MG capsule Take 2 capsules (1,334 mg total) by mouth 3 (three) times daily with meals. 11/10/13  Yes Hosie Poisson, MD  cyclobenzaprine (FLEXERIL) 10 MG tablet Take 10 mg by mouth 3 (three) times daily as needed for muscle spasms.   Yes Historical Provider, MD  Docusate Calcium (STOOL SOFTENER PO) Take 1-3 capsules by mouth daily as needed (constipation).   Yes Historical Provider, MD  doxycycline (VIBRA-TABS) 100 MG tablet Take 1 tablet (100 mg total) by mouth 2 (two) times daily. 01/30/14  Yes Kinnie Feil, MD  HYDROcodone-acetaminophen (NORCO) 5-325 MG per tablet Take 1 tablet by mouth 3 (three) times daily as needed for moderate pain. 01/30/14  Yes Kinnie Feil, MD  Insulin Detemir (LEVEMIR FLEXPEN) 100 UNIT/ML Pen Inject 20 Units into the skin every evening. 10/29/13  Yes Amy D Clegg, NP  insulin lispro (HUMALOG KWIKPEN) 100 UNIT/ML KiwkPen Inject 0.05 mLs (5 Units  total) into the skin 3 (three) times daily with meals. 01/26/14  Yes Alycia Rossetti, MD  methylcellulose (ARTIFICIAL TEARS) 1 % ophthalmic solution Place 1 drop into both eyes at bedtime as needed (dry eyes).   Yes Historical Provider, MD  midodrine (PROAMATINE) 10 MG tablet Take 5 mg by mouth 3 (three) times a week. Monday, Wednesday and Friday   Yes Historical Provider, MD  multivitamin (RENA-VIT) TABS tablet Take 1 tablet by mouth daily.   Yes Historical Provider, MD  pantoprazole (PROTONIX) 40 MG tablet Take 1 tablet (40 mg total) by mouth daily. 10/29/13  Yes Amy D Clegg, NP  polyethylene glycol (MIRALAX / GLYCOLAX) packet Take 17 g by mouth daily as needed (constipation). 10/29/13  Yes Amy D Clegg, NP  promethazine (PHENERGAN) 25 MG tablet Take 1 tablet (25 mg total) by mouth every 8 (eight) hours as needed for nausea or vomiting. 12/11/13  Yes Susy Frizzle, MD  traMADol (ULTRAM) 50 MG tablet Take 1 tablet (50 mg total) by mouth every 6 (six) hours as needed for moderate pain. 01/07/14  Yes Alycia Rossetti, MD    ursodiol (ACTIGALL) 300 MG capsule Take 1 capsule (300 mg total) by mouth 2 (two) times daily. 11/29/13  Yes Ripudeep Krystal Eaton, MD      Allergies  Allergen Reactions  . Crestor [Rosuvastatin] Other (See Comments)    Severe muscle weakness  . Nsaids Other (See Comments)    Not allergic, "bad on my kidneys"  . Ciprofloxacin Rash     Social History:  reports that she quit smoking about 2 years ago. She has never used smokeless tobacco. She reports that she does not drink alcohol or use illicit drugs.     Family History  Problem Relation Age of Onset  . Coronary artery disease Father   . Asthma Father   . COPD Father   . Hypertension Father   . Hyperlipidemia Father   . Diabetes Father   . Congestive Heart Failure Father   . Heart disease Father     before age 16  . Heart attack Father   . Peripheral vascular disease Father   . Hypertension Mother   . Hyperlipidemia Mother   . Diabetes Mother   . Cancer Mother   . Cancer Maternal Aunt      three aunts, bone, breast, ?  . Hypertension Brother   . Diabetes Brother   . Diabetes Sister   . Colon cancer Neg Hx   . Celiac disease Neg Hx   . Crohn's disease Neg Hx   . Ulcerative colitis Neg Hx   . Lung cancer Maternal Grandmother        Physical Exam:  GEN:  Pleasant Obese 43 y.o. Caucasian female  examined  and in no acute distress; cooperative with exam Filed Vitals:   02/03/14 0115 02/03/14 0145 02/03/14 0215 02/03/14 0247  BP: 129/89 127/75 126/74 117/80  Pulse: 107 107 106 110  Temp:    97.9 F (36.6 C)  TempSrc:    Oral  Resp: 14 18 14 16   Height:    5\' 7"  (1.702 m)  Weight:    102.785 kg (226 lb 9.6 oz)  SpO2: 96% 99% 97% 94%   Blood pressure 117/80, pulse 110, temperature 97.9 F (36.6 C), temperature source Oral, resp. rate 16, height 5\' 7"  (1.702 m), weight 102.785 kg (226 lb 9.6 oz), SpO2 94.00%. PSYCH: She is alert and oriented x4; does not appear anxious does not appear  depressed; affect is  normal HEENT: Normocephalic and Atraumatic, Mucous membranes pink; PERRLA; EOM intact; Fundi:  Benign;  No scleral icterus, Nares: Patent, Oropharynx: Clear, Fair Dentition,    Neck:  FROM, No Cervical Lymphadenopathy nor Thyromegaly or Carotid Bruit; No JVD; Breasts:: Not examined CHEST WALL: No tenderness CHEST: Normal respiration, clear to auscultation bilaterally HEART: Regular rate and rhythm; no murmurs rubs or gallops BACK: No kyphosis or scoliosis; No CVA tenderness ABDOMEN: Positive Bowel Sounds, Obese,  + erythema of Left Lower ABD , Soft  Mildly Tender; No Masses, No Organomegaly. Rectal Exam: Not done EXTREMITIES: No Cyanosis, Clubbing, or Edema; + Healing Ulcerations X 2 on Right Lower Leg Genitalia: not examined PULSES: 2+ and symmetric SKIN: Normal hydration no rash or ulceration CNS:  Alert and Oriented, NO Acute Focal Deficits  Vascular: pulses palpable throughout    Labs on Admission:  Basic Metabolic Panel:  Recent Labs Lab 01/28/14 2117 01/29/14 0826 01/30/14 0500 02/03/14 0012  NA 135* 136* 134* 139  K 4.0 4.1 4.5 4.3  CL 89* 91* 91* 97  CO2 29 29 24 28   GLUCOSE 309* 238* 199* 117*  BUN 38* 47* 62* 56*  CREATININE 3.90* 4.54* 5.78* 5.56*  CALCIUM 8.5 8.7 9.1 10.3   Liver Function Tests:  Recent Labs Lab 01/28/14 2117 01/29/14 0826 02/03/14 0012  AST 22 18 16   ALT 16 15 13   ALKPHOS 123* 117 117  BILITOT 0.7 0.6 0.5  PROT 6.3 6.0 6.4  ALBUMIN 2.7* 2.6* 2.5*    Recent Labs Lab 02/03/14 0012  LIPASE 12   No results found for this basename: AMMONIA,  in the last 168 hours CBC:  Recent Labs Lab 01/28/14 2117 01/29/14 0826 01/30/14 0500 02/03/14 0012  WBC 7.2 6.1 5.6 7.1  NEUTROABS 6.0 4.4  --  5.1  HGB 11.0* 10.2* 10.3* 11.3*  HCT 35.3* 33.2* 34.1* 37.5  MCV 96.2 95.7 96.1 100.8*  PLT 226 221 221 245   Cardiac Enzymes: No results found for this basename: CKTOTAL, CKMB, CKMBINDEX, TROPONINI,  in the last 168 hours  BNP (last 3  results)  Recent Labs  08/13/13 1949 09/06/13 0740  PROBNP 34187.0* 28622.0*   CBG:  Recent Labs Lab 01/29/14 1645 01/29/14 2144 01/29/14 2353 01/30/14 0726 01/30/14 1300  GLUCAP 283* 391* 358* 158* 105*    Radiological Exams on Admission: No results found.    Assessment/Plan:   43 y.o. female with   Principal Problem:   1.   Cellulitis, abdominal wall   IV Vancomycin   Active Problems:   2.   Lower abdominal pain- Due to #1   Pain Control PRN    3.   Essential hypertension, benign   Monitor BPs       4.   ESRD (end stage renal disease) on dialysis   Dialysis on MWF   Notify Dialysis Team to Remain on Schedule     5.   Chronic combined systolic and diastolic CHF, NYHA class 3   Monitor for Signs and Sxs of Fluid Overload     6.   Cardiomyopathy, ischemic- EF 25-30% July 2015   Same as #5     7.   Diabetic retinopathy associated with type 2 diabetes mellitus   Chronic,      8.   Diabetes   Continue  LEvemir Insulin   SSI coverage PRN   Check HbA1C    9.   DVT Prophylaxis    Lovenox  Code Status:  FULL CODE     Family Communication:    Husband at Bedside Disposition Plan:   Inpatient      Time spent: [place] Pager [phone]   If [place] Please Contact the Day Rounding Team MD for [facility]  If 7PM-7AM, Please Contact Night-Floor Coverage  [email or web address] Password [email or web address] 02/03/2014, 3:09 AM

## 2014-02-03 NOTE — Progress Notes (Signed)
ANTIBIOTIC CONSULT NOTE - INITIAL  Pharmacy Consult for Zosyn Indication: Cellulitis  Allergies  Allergen Reactions  . Crestor [Rosuvastatin] Other (See Comments)    Severe muscle weakness  . Nsaids Other (See Comments)    Not allergic, "bad on my kidneys"  . Ciprofloxacin Rash    Patient Measurements: Height: 5\' 7"  (170.2 cm) Weight: 226 lb 9.6 oz (102.785 kg) IBW/kg (Calculated) : 61.6 Adjusted Body Weight:   Vital Signs: Temp: 98.4 F (36.9 C) (10/18 1000) Temp Source: Oral (10/18 1000) BP: 101/74 mmHg (10/18 1000) Pulse Rate: 107 (10/18 1000) Intake/Output from previous day: 10/17 0701 - 10/18 0700 In: 260 [P.O.:60; IV Piggyback:200] Out: 250 [Urine:250] Intake/Output from this shift: Total I/O In: 123 [P.O.:120; I.V.:3] Out: 400 [Urine:400]  Labs:  Recent Labs  02/03/14 0012 02/03/14 0745  WBC 7.1 6.9  HGB 11.3* 11.0*  PLT 245 255  CREATININE 5.56* 5.77*   Estimated Creatinine Clearance: 15.2 ml/min (by C-G formula based on Cr of 5.77). No results found for this basename: VANCOTROUGH, Corlis Leak, VANCORANDOM, GENTTROUGH, GENTPEAK, GENTRANDOM, TOBRATROUGH, TOBRAPEAK, TOBRARND, AMIKACINPEAK, AMIKACINTROU, AMIKACIN,  in the last 72 hours   Microbiology: Recent Results (from the past 720 hour(s))  MRSA PCR SCREENING     Status: None   Collection Time    02/03/14  3:14 AM      Result Value Ref Range Status   MRSA by PCR NEGATIVE  NEGATIVE Final   Comment:            The GeneXpert MRSA Assay (FDA     approved for NASAL specimens     only), is one component of a     comprehensive MRSA colonization     surveillance program. It is not     intended to diagnose MRSA     infection nor to guide or     monitor treatment for     MRSA infections.    Medical History: Past Medical History  Diagnosis Date  . High cholesterol   . Diabetic retinopathy   . Peripheral neuropathy     "tips of toes"  . Blind right eye   . CHF (congestive heart failure)   . CAD  (coronary artery disease)   . GERD (gastroesophageal reflux disease)   . Hypertension   . History of lung cancer 07/2011    s/p left lower lobectomy  . Diabetes mellitus     IDDM  . Cataract of right eye   . Ulcer of toe of right foot 07/10/2012    great toe  . Breast calcification, left 06/2012  . Acute biphenotypic leukemia   . CKD (chronic kidney disease) stage 5, GFR less than 15 ml/min   . Nephrotic syndrome   . Gastritis     H/o gastritis on prior endoscopy  . Anemia   . Carotid artery disease   . Gallstones   . Myocardial infarction 5/16    Medications:  Scheduled:  . aspirin EC  81 mg Oral Daily  . atorvastatin  80 mg Oral q1800  . calcium acetate  1,334 mg Oral TID WC  . enoxaparin (LOVENOX) injection  30 mg Subcutaneous Q24H  . insulin aspart  0-5 Units Subcutaneous QHS  . insulin aspart  0-9 Units Subcutaneous TID WC  . insulin detemir  20 Units Subcutaneous QPM  . [START ON 02/04/2014] midodrine  5 mg Oral Once per day on Mon Wed Fri  . multivitamin  1 tablet Oral QHS  . pantoprazole  40 mg Oral  Daily  . sodium chloride  3 mL Intravenous Q12H  . ursodiol  300 mg Oral BID  . [START ON 02/04/2014] vancomycin  1,000 mg Intravenous Q M,W,F-HD   Assessment: 43yo female on Vancomycin for cellulitis, to add Zosyn.  Pt receives HD MWF.  No cultures have been drawn.  Goal of Therapy:  resolution of infection  Plan:  Zosyn 2.25g IV q8  Gracy Bruins, PharmD Clinical Pharmacist [facility]

## 2014-02-03 NOTE — Progress Notes (Signed)
UR completed 

## 2014-02-03 NOTE — Progress Notes (Signed)
Called ED RN for report on patient. Valerya Maxton, Wonda Cheng, Therapist, sports

## 2014-02-03 NOTE — Progress Notes (Signed)
ANTIBIOTIC CONSULT NOTE - INITIAL  Pharmacy Consult for Vancomycin  Indication: Cellulitis  Allergies  Allergen Reactions  . Crestor [Rosuvastatin] Other (See Comments)    Severe muscle weakness  . Nsaids Other (See Comments)    Not allergic, "bad on my kidneys"  . Ciprofloxacin Rash    Patient Measurements: Height: 5\' 7"  (170.2 cm) Weight: 226 lb 9.6 oz (102.785 kg) IBW/kg (Calculated) : 61.6  Vital Signs: Temp: 97.9 F (36.6 C) (10/18 0247) Temp Source: Oral (10/18 0247) BP: 117/80 mmHg (10/18 0247) Pulse Rate: 110 (10/18 0247)  Labs:  Recent Labs  02/03/14 0012  WBC 7.1  HGB 11.3*  PLT 245  CREATININE 5.56*   Estimated Creatinine Clearance: 15.8 ml/min (by C-G formula based on Cr of 5.56). No results found for this basename: VANCOTROUGH, VANCOPEAK, VANCORANDOM, GENTTROUGH, GENTPEAK, GENTRANDOM, TOBRATROUGH, TOBRAPEAK, TOBRARND, AMIKACINPEAK, AMIKACINTROU, AMIKACIN,  in the last 72 hours   Medical History: Past Medical History  Diagnosis Date  . High cholesterol   . Diabetic retinopathy   . Peripheral neuropathy     "tips of toes"  . Blind right eye   . CHF (congestive heart failure)   . CAD (coronary artery disease)   . GERD (gastroesophageal reflux disease)   . Hypertension   . History of lung cancer 07/2011    s/p left lower lobectomy  . Diabetes mellitus     IDDM  . Cataract of right eye   . Ulcer of toe of right foot 07/10/2012    great toe  . Breast calcification, left 06/2012  . Acute biphenotypic leukemia   . CKD (chronic kidney disease) stage 5, GFR less than 15 ml/min   . Nephrotic syndrome   . Gastritis     H/o gastritis on prior endoscopy  . Anemia   . Carotid artery disease   . Gallstones   . Myocardial infarction 5/16   Assessment: 43 y/o F with apparent failure of oral antibiotic therapy for cellulitis. To start vancomycin per pharmacy. Pt gets HD on MWF, WBC WNL, other labs as above.   Goal of Therapy:  Pre-HD vancomycin level  15-25 mg/L  Plan:  -Vancomycin 2000 mg IV LOAD, then 1000 mg IV qHD MWF -Trend WBC, temp, clinical status, HD schedule -Drug levels as indicated   Narda Bonds 02/03/2014,3:12 AM

## 2014-02-03 NOTE — Consult Note (Signed)
[facility] Renal Consultation Note  Indication for Consultation:  Management of ESRD/hemodialysis; anemia, hypertension/volume and secondary hyperparathyroidism  HPI: Hannah [name] is a 43 y.o. female with a history of Diabetes Type 1, CAD, ischemic cardiomyopathy (EF 25-30%), lung cancer s/p LLL resection 2013, right axillary DVT on Coumadin, and ESRD on dialysis at the [facility] who was admitted 10/13 - 14 for left lower quadrant abdominal pain, believed to be associated with constipation which improved after enema, but returned to the ER yesterday with worsening pain in the same area.  She describes that the pain as dull and aching, but sharp with deep palpation.  She had a prolonged hospitalization 5/18-7/13/15, initially for evaluation of CAD, but developed cardiogenic shock, requiring initiation of dialysis with CVVHD and placement of an IABP, after which she developed right foot ischemia and subsequently required femoral thromboembolectomy and fasciotomy of her right lower extremity, but had groin and fasciotomy wounds requiring wound vacs, several debridements, and long-term antibiotics and wound care.  During her last admission CT showed inflammatory changes around the right lower quadrant scar, suggesting cellulitis, and she briefly received IV Vancomycin and Zosyn before her discharge on PO Augmentin and Doxycycline.  However, her current pain is exclusively on the left side, and she denies any fever, chills, or vomiting and reports regular bowel movements since her discharge. I maybe can appreciate some redness in that region  Dialysis Orders: MWF @ [facility]  4 hrs 116 kg 2K/2.25Ca 400/A1.5 Heparin 5000 U L IJ catheter  Hectorol 5 mcg Aranesp 120 mcg & Venofer 50 mg on Wed  Past Medical History  Diagnosis Date  . High cholesterol   . Diabetic retinopathy   . Peripheral neuropathy     "tips of toes"  . Blind right eye   . CHF (congestive heart failure)   .  CAD (coronary artery disease)   . GERD (gastroesophageal reflux disease)   . Hypertension   . History of lung cancer 07/2011    s/p left lower lobectomy  . Diabetes mellitus     IDDM  . Cataract of right eye   . Ulcer of toe of right foot 07/10/2012    great toe  . Breast calcification, left 06/2012  . Acute biphenotypic leukemia   . CKD (chronic kidney disease) stage 5, GFR less than 15 ml/min   . Nephrotic syndrome   . Gastritis     H/o gastritis on prior endoscopy  . Anemia   . Carotid artery disease   . Gallstones   . Myocardial infarction 5/16   Past Surgical History  Procedure Laterality Date  . Cardiac catheterization  07/16/2011  . Incision and drainage breast abscess Left   . Tubal ligation  1994  . Vitrectomy  2010    2 on left, 1 on right  . Cesarean section  1991; 1994  . Video assisted thoracoscopy (vats)/ lobectomy Left 07/30/2011    left main thoracotomy, left lower lobectomy, mediastinal lymph node dissection  . Lobectomy    . Colonoscopy with esophagogastroduodenoscopy (egd) N/A 08/14/2012    Procedure: COLONOSCOPY WITH ESOPHAGOGASTRODUODENOSCOPY (EGD);  Surgeon: Danie Binder, MD;  [facility];  Service: Endoscopy;  Laterality: N/A;  10:45-moved to [address] to notify pt  . Breast lumpectomy with needle localization Left 11/14/2012    Procedure: BREAST LUMPECTOMY WITH NEEDLE LOCALIZATION;  Surgeon: Marcello Moores A. Cornett, MD;  Location: [place];  Service: General;  Laterality: Left;  . Insertion of dialysis  catheter Left 09/12/2013    Procedure: INSERTION OF DIALYSIS CATHETER;  Surgeon: Mal Misty, MD;  Location: [place];  Service: Vascular;  Laterality: Left;  . Embolectomy Right 09/17/2013    Procedure: Thrombectomy of Right Common Femoral Artery;  Surgeon: Elam Dutch, MD;  Location: [facility] OR;  Service: Vascular;  Laterality: Right;  . Endarterectomy femoral Right 09/17/2013    Procedure: Right Femoral Endarterectomy;  Surgeon: Elam Dutch, MD;   Location: [facility] OR;  Service: Vascular;  Laterality: Right;  . Patch angioplasty Right 09/17/2013    Procedure: Vein Patch Angioplasty of Right Femoral Artery;  Surgeon: Elam Dutch, MD;  Location: [place];  Service: Vascular;  Laterality: Right;  . Fasciotomy Right 09/17/2013    Procedure: Four Compartment Fasciotomy;  Surgeon: Elam Dutch, MD;  Location: [place];  Service: Vascular;  Laterality: Right;  . Fasciotomy closure Right 09/19/2013    Procedure: FASCIOTOMY CLOSURE;  Surgeon: Elam Dutch, MD;  Location: [place];  Service: Vascular;  Laterality: Right;  regional block and monitored anesthesia care used  . I&d extremity Right 09/28/2013    Procedure: IRRIGATION AND DEBRIDEMENT EXTREMITY;  Surgeon: Serafina Mitchell, MD;  Location: [place];  Service: Vascular;  Laterality: Right;  . Application of wound vac Right 09/28/2013    Procedure: APPLICATION OF WOUND VAC;  Surgeon: Serafina Mitchell, MD;  Location: [place];  Service: Vascular;  Laterality: Right;  . Av fistula placement Right 01/03/2014    Procedure: ARTERIOVENOUS (AV) FISTULA CREATION;  Surgeon: Angelia Mould, MD;  Location: [place];  Service: Vascular;  Laterality: Right;  . Ligation of arteriovenous  fistula Right 01/04/2014    Procedure: LIGATION OF ARTERIOVENOUS  FISTULA;  Surgeon: Rosetta Posner, MD;  Location: [facility] OR;  Service: Vascular;  Laterality: Right;   Family History  Problem Relation Age of Onset  . Coronary artery disease Father   . Asthma Father   . COPD Father   . Hypertension Father   . Hyperlipidemia Father   . Diabetes Father   . Congestive Heart Failure Father   . Heart disease Father     before age 24  . Heart attack Father   . Peripheral vascular disease Father   . Hypertension Mother   . Hyperlipidemia Mother   . Diabetes Mother   . Cancer Mother   . Cancer Maternal Aunt      three aunts, bone, breast, ?  . Hypertension Brother   . Diabetes Brother   . Diabetes Sister   . Colon cancer Neg Hx   .  Celiac disease Neg Hx   . Crohn's disease Neg Hx   . Ulcerative colitis Neg Hx   . Lung cancer Maternal Grandmother    Social History  She previously smoked as much as 3 packs of cigarettes a day before quitting in 07/2011 prior to surgery for lung cancer. She denies any history of alcohol or illicit drug use.  Allergies  Allergen Reactions  . Crestor [Rosuvastatin] Other (See Comments)    Severe muscle weakness  . Nsaids Other (See Comments)    Not allergic, "bad on my kidneys"  . Ciprofloxacin Rash   Prior to Admission medications   Medication Sig Start Date End Date Taking? Authorizing Provider  amoxicillin-clavulanate (AUGMENTIN) 875-125 MG per tablet Take 1 tablet by mouth 2 (two) times daily. 01/30/14  Yes Kinnie Feil, MD  aspirin EC 81 MG EC tablet Take 1 tablet (81 mg total) by  mouth daily. 08/17/13  Yes Belkys A Regalado, MD  atorvastatin (LIPITOR) 80 MG tablet Take 1 tablet (80 mg total) by mouth daily at 6 PM. 10/29/13  Yes Amy D Clegg, NP  calcium acetate (PHOSLO) 667 MG capsule Take 2 capsules (1,334 mg total) by mouth 3 (three) times daily with meals. 11/10/13  Yes Hosie Poisson, MD  cyclobenzaprine (FLEXERIL) 10 MG tablet Take 10 mg by mouth 3 (three) times daily as needed for muscle spasms.   Yes Historical Provider, MD  Docusate Calcium (STOOL SOFTENER PO) Take 1-3 capsules by mouth daily as needed (constipation).   Yes Historical Provider, MD  doxycycline (VIBRA-TABS) 100 MG tablet Take 1 tablet (100 mg total) by mouth 2 (two) times daily. 01/30/14  Yes Kinnie Feil, MD  HYDROcodone-acetaminophen (NORCO) 5-325 MG per tablet Take 1 tablet by mouth 3 (three) times daily as needed for moderate pain. 01/30/14  Yes Kinnie Feil, MD  Insulin Detemir (LEVEMIR FLEXPEN) 100 UNIT/ML Pen Inject 20 Units into the skin every evening. 10/29/13  Yes Amy D Clegg, NP  insulin lispro (HUMALOG KWIKPEN) 100 UNIT/ML KiwkPen Inject 0.05 mLs (5 Units total) into the skin 3 (three) times  daily with meals. 01/26/14  Yes Alycia Rossetti, MD  methylcellulose (ARTIFICIAL TEARS) 1 % ophthalmic solution Place 1 drop into both eyes at bedtime as needed (dry eyes).   Yes Historical Provider, MD  midodrine (PROAMATINE) 10 MG tablet Take 5 mg by mouth 3 (three) times a week. Monday, Wednesday and Friday   Yes Historical Provider, MD  multivitamin (RENA-VIT) TABS tablet Take 1 tablet by mouth daily.   Yes Historical Provider, MD  pantoprazole (PROTONIX) 40 MG tablet Take 1 tablet (40 mg total) by mouth daily. 10/29/13  Yes Amy D Clegg, NP  polyethylene glycol (MIRALAX / GLYCOLAX) packet Take 17 g by mouth daily as needed (constipation). 10/29/13  Yes Amy D Clegg, NP  promethazine (PHENERGAN) 25 MG tablet Take 1 tablet (25 mg total) by mouth every 8 (eight) hours as needed for nausea or vomiting. 12/11/13  Yes Susy Frizzle, MD  traMADol (ULTRAM) 50 MG tablet Take 1 tablet (50 mg total) by mouth every 6 (six) hours as needed for moderate pain. 01/07/14  Yes Alycia Rossetti, MD  ursodiol (ACTIGALL) 300 MG capsule Take 1 capsule (300 mg total) by mouth 2 (two) times daily. 11/29/13  Yes Ripudeep Krystal Eaton, MD   Labs:  Results for orders placed during the hospital encounter of 02/03/14 (from the past 48 hour(s))  CBC WITH DIFFERENTIAL     Status: Abnormal   Collection Time    02/03/14 12:12 AM      Result Value Ref Range   WBC 7.1  4.0 - 10.5 K/uL   RBC 3.72 (*) 3.87 - 5.11 MIL/uL   Hemoglobin 11.3 (*) 12.0 - 15.0 g/dL   HCT 37.5  36.0 - 46.0 %   MCV 100.8 (*) 78.0 - 100.0 fL   MCH 30.4  26.0 - 34.0 pg   MCHC 30.1  30.0 - 36.0 g/dL   RDW 19.8 (*) 11.5 - 15.5 %   Platelets 245  150 - 400 K/uL   Neutrophils Relative % 71  43 - 77 %   Neutro Abs 5.1  1.7 - 7.7 K/uL   Lymphocytes Relative 17  12 - 46 %   Lymphs Abs 1.2  0.7 - 4.0 K/uL   Monocytes Relative 6  3 - 12 %   Monocytes Absolute 0.4  0.1 - 1.0 K/uL   Eosinophils Relative 5  0 - 5 %   Eosinophils Absolute 0.4  0.0 - 0.7 K/uL    Basophils Relative 1  0 - 1 %   Basophils Absolute 0.1  0.0 - 0.1 K/uL  COMPREHENSIVE METABOLIC PANEL     Status: Abnormal   Collection Time    02/03/14 12:12 AM      Result Value Ref Range   Sodium 139  137 - 147 mEq/L   Potassium 4.3  3.7 - 5.3 mEq/L   Chloride 97  96 - 112 mEq/L   CO2 28  19 - 32 mEq/L   Glucose, Bld 117 (*) 70 - 99 mg/dL   BUN 56 (*) 6 - 23 mg/dL   Creatinine, Ser 5.56 (*) 0.50 - 1.10 mg/dL   Calcium 10.3  8.4 - 10.5 mg/dL   Total Protein 6.4  6.0 - 8.3 g/dL   Albumin 2.5 (*) 3.5 - 5.2 g/dL   AST 16  0 - 37 U/L   ALT 13  0 - 35 U/L   Alkaline Phosphatase 117  39 - 117 U/L   Total Bilirubin 0.5  0.3 - 1.2 mg/dL   GFR calc non Af Amer 8 (*) >90 mL/min   GFR calc Af Amer 10 (*) >90 mL/min   Comment: (NOTE)     The eGFR has been calculated using the CKD EPI equation.     This calculation has not been validated in all clinical situations.     eGFR's persistently <90 mL/min signify possible Chronic Kidney     Disease.   Anion gap 14  5 - 15  LIPASE, BLOOD     Status: None   Collection Time    02/03/14 12:12 AM      Result Value Ref Range   Lipase 12  11 - 59 U/L  URINALYSIS, ROUTINE W REFLEX MICROSCOPIC     Status: Abnormal   Collection Time    02/03/14  1:17 AM      Result Value Ref Range   Color, Urine STRAW (*) YELLOW   APPearance CLEAR  CLEAR   Specific Gravity, Urine 1.036 (*) 1.005 - 1.030   pH 5.5  5.0 - 8.0   Glucose, UA >1000 (*) NEGATIVE mg/dL   Hgb urine dipstick NEGATIVE  NEGATIVE   Bilirubin Urine NEGATIVE  NEGATIVE   Ketones, ur NEGATIVE  NEGATIVE mg/dL   Protein, ur NEGATIVE  NEGATIVE mg/dL   Urobilinogen, UA 0.2  0.0 - 1.0 mg/dL   Nitrite NEGATIVE  NEGATIVE   Leukocytes, UA NEGATIVE  NEGATIVE  URINE MICROSCOPIC-ADD ON     Status: None   Collection Time    02/03/14  1:17 AM      Result Value Ref Range   WBC, UA 3-6  <3 WBC/hpf   RBC / HPF 0-2  <3 RBC/hpf  MRSA PCR SCREENING     Status: None   Collection Time    02/03/14  3:14 AM       Result Value Ref Range   MRSA by PCR NEGATIVE  NEGATIVE   Comment:            The GeneXpert MRSA Assay (FDA     approved for NASAL specimens     only), is one component of a     comprehensive MRSA colonization     surveillance program. It is not     intended to diagnose MRSA     infection nor to  guide or     monitor treatment for     MRSA infections.  BASIC METABOLIC PANEL     Status: Abnormal   Collection Time    02/03/14  7:45 AM      Result Value Ref Range   Sodium 137  137 - 147 mEq/L   Potassium 4.3  3.7 - 5.3 mEq/L   Chloride 96  96 - 112 mEq/L   CO2 25  19 - 32 mEq/L   Glucose, Bld 123 (*) 70 - 99 mg/dL   BUN 58 (*) 6 - 23 mg/dL   Creatinine, Ser 5.77 (*) 0.50 - 1.10 mg/dL   Calcium 9.9  8.4 - 10.5 mg/dL   GFR calc non Af Amer 8 (*) >90 mL/min   GFR calc Af Amer 9 (*) >90 mL/min   Comment: (NOTE)     The eGFR has been calculated using the CKD EPI equation.     This calculation has not been validated in all clinical situations.     eGFR's persistently <90 mL/min signify possible Chronic Kidney     Disease.   Anion gap 16 (*) 5 - 15  CBC     Status: Abnormal   Collection Time    02/03/14  7:45 AM      Result Value Ref Range   WBC 6.9  4.0 - 10.5 K/uL   RBC 3.68 (*) 3.87 - 5.11 MIL/uL   Hemoglobin 11.0 (*) 12.0 - 15.0 g/dL   HCT 35.8 (*) 36.0 - 46.0 %   MCV 97.3  78.0 - 100.0 fL   MCH 29.9  26.0 - 34.0 pg   MCHC 30.7  30.0 - 36.0 g/dL   RDW 19.8 (*) 11.5 - 15.5 %   Platelets 255  150 - 400 K/uL       Review of Systems  Constitutional: negative for chills, fatigue, fevers and sweats  Ears, nose, mouth, throat, and face: negative for earaches, hoarseness, nasal congestion and sore throat  Respiratory: negative for cough, dyspnea on exertion, hemoptysis and sputum  Cardiovascular: negative for chest pain, chest pressure/discomfort, dyspnea, orthopnea and palpitations  Gastrointestinal: positive for LLQ abdominal pain, mild nausea;  negative for vomiting,  constipation, or diarrhea Genitourinary:negative, oliguric  Musculoskeletal:negative for arthralgias, back pain, myalgias and neck pain  Neurological: negative for dizziness, gait problems, headaches, paresthesia and speech problems   Physical Exam: Filed Vitals:   02/03/14 0514  BP: 118/63  Pulse: 109  Temp: 98.2 F (36.8 C)  Resp: 15     General appearance: alert, cooperative and no distress Head: Normocephalic, without obvious abnormality, atraumatic Neck: no adenopathy, no carotid bruit, no JVD and supple, symmetrical, trachea midline Resp: clear to auscultation bilaterally Cardio: regular rate and rhythm, S1, S2 normal, no murmur, click, rub or gallop GI:  + BS, soft with LLQ tenderness to palpation, no redness; RLQ wound with dressing  Extremities: extremities normal, atraumatic, no cyanosis or edema Neurologic: Grossly normal Dialysis Access: L IJ catheter with clean intact dressing   Assessment/Plan:  1. LLQ pain - CT 10/13 showed inflammatory changes around RLQ scar, suggesting cellulitis, and only stool on L, symptoms improved s/p enema; received IV Vancomycin & Zosyn before DC on PO Augmentin & Doxycycline, now back on IV Vanc.  Per primary. 2. RLQ & RLE wounds - S/p thromboembolectomy, fasciotomy, several debridements, antibiotics; almost healed according to pt. 3. ESRD - HD on MWF @ [facility], K 4.3. HD tomorrow via [facility]. 4. Dialysis access - L IJ  catheter; AVF @ [facility] placed 9/17, ligated 9/18 sec to steal, follow-up 10/29. 5. HTN/volume - BP 101/74 on Midodrine 5 mg pre-HD; wt 102.8 kg today, large difference in outpatient EDW, usually gains 2-3 L and reaches UF goal. 6. Anemia - Hgb 11, Aranesp 120 mcg & Venofer 50 mg on Weds. 7. Metabolic bone disease - Ca 9.9 (11.1 corrected), last P 6.9, iPTH 449; Hectorol 5 mcg, Phoslo 3 with meals.  Hold Hectorol, 2Ca bath. 8. Nutrition - Alb 2.5, renal carb-mod diet, multivitamin. 9. DM Type 1 - with retinopathy, on insulin per  primary. 10. CAD - 3V disease, no targets for revascularization per cath 08/2013. 11. Carotid stenosis - R 60-79%, L 80-99%; high grade left carotid stenosis for carotid stenting in the future (Dr. Trula Slade) 12. 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 Oneida Alar) 70. S/p acute biliary colic 10/3708 resolved with medical management - high risk for surgery   LYLES,CHARLES 02/03/2014, 9:47 AM   Attending Nephrologist: Hannah Parish, MD  Patient seen and examined, agree with above note with above modifications.  Well known to our service - just left last week- now with presumed cellulitis of abdominal wall on IV vanc- to get HD tomorrow regular day  Hannah Parish, MD 02/03/2014

## 2014-02-03 NOTE — ED Provider Notes (Signed)
CSN: [id number]     Arrival date & time 02/02/14  2239 History   First MD Initiated Contact with Patient 02/03/14 0044     Chief Complaint  Patient presents with  . Abdominal Pain     (Consider location/radiation/quality/duration/timing/severity/associated sxs/prior Treatment) Patient is a 43 y.o. female presenting with abdominal pain. The history is provided by the patient.  Abdominal Pain She complains of severe left lower quadrant pain. She had been admitted to the hospital for similar pain and was diagnosed with cellulitis and discharged 3 days ago. Symptoms started to get worse 2 days ago. Pain is rated 9/10. It is worse with movement or palpation. Nothing makes it better. She denies fever chills or sweats. There's been nausea but no vomiting. She denies any diarrhea. She is a dialysis patient and does not urinate. She has been taking hydrocodone and acetaminophen without relief. She states that she has been compliant with the medications she was discharged on-amoxicillin-clavulanic acid and doxycycline.  Past Medical History  Diagnosis Date  . High cholesterol   . Diabetic retinopathy   . Peripheral neuropathy     "tips of toes"  . Blind right eye   . CHF (congestive heart failure)   . CAD (coronary artery disease)   . GERD (gastroesophageal reflux disease)   . Hypertension   . History of lung cancer 07/2011    s/p left lower lobectomy  . Diabetes mellitus     IDDM  . Cataract of right eye   . Ulcer of toe of right foot 07/10/2012    great toe  . Breast calcification, left 06/2012  . Acute biphenotypic leukemia   . CKD (chronic kidney disease) stage 5, GFR less than 15 ml/min   . Nephrotic syndrome   . Gastritis     H/o gastritis on prior endoscopy  . Anemia   . Carotid artery disease   . Gallstones   . Myocardial infarction 5/16   Past Surgical History  Procedure Laterality Date  . Cardiac catheterization  07/16/2011  . Incision and drainage breast abscess Left   .  Tubal ligation  1994  . Vitrectomy  2010    2 on left, 1 on right  . Cesarean section  1991; 1994  . Video assisted thoracoscopy (vats)/ lobectomy Left 07/30/2011    left main thoracotomy, left lower lobectomy, mediastinal lymph node dissection  . Lobectomy    . Colonoscopy with esophagogastroduodenoscopy (egd) N/A 08/14/2012    Procedure: COLONOSCOPY WITH ESOPHAGOGASTRODUODENOSCOPY (EGD);  Surgeon: Danie Binder, MD;  [facility];  Service: Endoscopy;  Laterality: N/A;  10:45-moved to [address] to notify pt  . Breast lumpectomy with needle localization Left 11/14/2012    Procedure: BREAST LUMPECTOMY WITH NEEDLE LOCALIZATION;  Surgeon: Marcello Moores A. Cornett, MD;  Location: [place];  Service: General;  Laterality: Left;  . Insertion of dialysis catheter Left 09/12/2013    Procedure: INSERTION OF DIALYSIS CATHETER;  Surgeon: Mal Misty, MD;  Location: [place];  Service: Vascular;  Laterality: Left;  . Embolectomy Right 09/17/2013    Procedure: Thrombectomy of Right Common Femoral Artery;  Surgeon: Elam Dutch, MD;  Location: [facility] OR;  Service: Vascular;  Laterality: Right;  . Endarterectomy femoral Right 09/17/2013    Procedure: Right Femoral Endarterectomy;  Surgeon: Elam Dutch, MD;  Location: [place];  Service: Vascular;  Laterality: Right;  . Patch angioplasty Right 09/17/2013    Procedure: Vein Patch Angioplasty of Right Femoral Artery;  Surgeon: Juanda Crumble  Antony Blackbird, MD;  Location: [place];  Service: Vascular;  Laterality: Right;  . Fasciotomy Right 09/17/2013    Procedure: Four Compartment Fasciotomy;  Surgeon: Elam Dutch, MD;  Location: [place];  Service: Vascular;  Laterality: Right;  . Fasciotomy closure Right 09/19/2013    Procedure: FASCIOTOMY CLOSURE;  Surgeon: Elam Dutch, MD;  Location: [place];  Service: Vascular;  Laterality: Right;  regional block and monitored anesthesia care used  . I&d extremity Right 09/28/2013    Procedure: IRRIGATION AND DEBRIDEMENT EXTREMITY;   Surgeon: Serafina Mitchell, MD;  Location: [place];  Service: Vascular;  Laterality: Right;  . Application of wound vac Right 09/28/2013    Procedure: APPLICATION OF WOUND VAC;  Surgeon: Serafina Mitchell, MD;  Location: [place];  Service: Vascular;  Laterality: Right;  . Av fistula placement Right 01/03/2014    Procedure: ARTERIOVENOUS (AV) FISTULA CREATION;  Surgeon: Angelia Mould, MD;  Location: [place];  Service: Vascular;  Laterality: Right;  . Ligation of arteriovenous  fistula Right 01/04/2014    Procedure: LIGATION OF ARTERIOVENOUS  FISTULA;  Surgeon: Rosetta Posner, MD;  Location: [facility] OR;  Service: Vascular;  Laterality: Right;   Family History  Problem Relation Age of Onset  . Coronary artery disease Father   . Asthma Father   . COPD Father   . Hypertension Father   . Hyperlipidemia Father   . Diabetes Father   . Congestive Heart Failure Father   . Heart disease Father     before age 61  . Heart attack Father   . Peripheral vascular disease Father   . Hypertension Mother   . Hyperlipidemia Mother   . Diabetes Mother   . Cancer Mother   . Cancer Maternal Aunt      three aunts, bone, breast, ?  . Hypertension Brother   . Diabetes Brother   . Diabetes Sister   . Colon cancer Neg Hx   . Celiac disease Neg Hx   . Crohn's disease Neg Hx   . Ulcerative colitis Neg Hx   . Lung cancer Maternal Grandmother    History  Substance Use Topics  . Smoking status: Former Smoker -- 2.00 packs/day for 30 years    Quit date: 08/01/2011  . Smokeless tobacco: Never Used  . Alcohol Use: No   OB History   Grav Para Term Preterm Abortions TAB SAB Ect Mult Living                 Review of Systems  Gastrointestinal: Positive for abdominal pain.  All other systems reviewed and are negative.     Allergies  Crestor; Nsaids; and Ciprofloxacin  Home Medications   Prior to Admission medications   Medication Sig Start Date End Date Taking? Authorizing Provider  amoxicillin-clavulanate  (AUGMENTIN) 875-125 MG per tablet Take 1 tablet by mouth 2 (two) times daily. 01/30/14  Yes Kinnie Feil, MD  aspirin EC 81 MG EC tablet Take 1 tablet (81 mg total) by mouth daily. 08/17/13  Yes Belkys A Regalado, MD  atorvastatin (LIPITOR) 80 MG tablet Take 1 tablet (80 mg total) by mouth daily at 6 PM. 10/29/13  Yes Amy D Clegg, NP  calcium acetate (PHOSLO) 667 MG capsule Take 2 capsules (1,334 mg total) by mouth 3 (three) times daily with meals. 11/10/13  Yes Hosie Poisson, MD  cyclobenzaprine (FLEXERIL) 10 MG tablet Take 10 mg by mouth 3 (three) times daily as needed for muscle spasms.  Yes Historical Provider, MD  Docusate Calcium (STOOL SOFTENER PO) Take 1-3 capsules by mouth daily as needed (constipation).   Yes Historical Provider, MD  doxycycline (VIBRA-TABS) 100 MG tablet Take 1 tablet (100 mg total) by mouth 2 (two) times daily. 01/30/14  Yes Kinnie Feil, MD  HYDROcodone-acetaminophen (NORCO) 5-325 MG per tablet Take 1 tablet by mouth 3 (three) times daily as needed for moderate pain. 01/30/14  Yes Kinnie Feil, MD  Insulin Detemir (LEVEMIR FLEXPEN) 100 UNIT/ML Pen Inject 20 Units into the skin every evening. 10/29/13  Yes Amy D Clegg, NP  insulin lispro (HUMALOG KWIKPEN) 100 UNIT/ML KiwkPen Inject 0.05 mLs (5 Units total) into the skin 3 (three) times daily with meals. 01/26/14  Yes Alycia Rossetti, MD  methylcellulose (ARTIFICIAL TEARS) 1 % ophthalmic solution Place 1 drop into both eyes at bedtime as needed (dry eyes).   Yes Historical Provider, MD  midodrine (PROAMATINE) 10 MG tablet Take 5 mg by mouth 3 (three) times a week. Monday, Wednesday and Friday   Yes Historical Provider, MD  multivitamin (RENA-VIT) TABS tablet Take 1 tablet by mouth daily.   Yes Historical Provider, MD  pantoprazole (PROTONIX) 40 MG tablet Take 1 tablet (40 mg total) by mouth daily. 10/29/13  Yes Amy D Clegg, NP  polyethylene glycol (MIRALAX / GLYCOLAX) packet Take 17 g by mouth daily as needed  (constipation). 10/29/13  Yes Amy D Clegg, NP  promethazine (PHENERGAN) 25 MG tablet Take 1 tablet (25 mg total) by mouth every 8 (eight) hours as needed for nausea or vomiting. 12/11/13  Yes Susy Frizzle, MD  traMADol (ULTRAM) 50 MG tablet Take 1 tablet (50 mg total) by mouth every 6 (six) hours as needed for moderate pain. 01/07/14  Yes Alycia Rossetti, MD  ursodiol (ACTIGALL) 300 MG capsule Take 1 capsule (300 mg total) by mouth 2 (two) times daily. 11/29/13  Yes Ripudeep K Rai, MD   BP 114/90  Pulse 116  Temp(Src) 97.9 F (36.6 C) (Oral)  Resp 18  Ht 5\' 7"  (1.702 m)  SpO2 97% Physical Exam  Nursing note and vitals reviewed.  43 year old female, resting comfortably and in no acute distress. Vital signs are significant for tachycardia. Oxygen saturation is 97%, which is normal. Head is normocephalic and atraumatic. PERRLA, EOMI. Oropharynx is clear. Neck is nontender and supple without adenopathy or JVD. Back is nontender and there is no CVA tenderness. Lungs are clear without rales, wheezes, or rhonchi. Chest is nontender. Dialysis access catheters are present in the left subclavian area. Heart has regular rate and rhythm without murmur. Abdomen demonstrates mild erythema in the upper abdomen and areas of more intense erythema in the suprapubic area and the bilateral lower quadrants. She has a dressing over a draining scar in the right lower abdomen. There is marked tenderness to palpation over the lower abdomen without rebound or guarding. There is no tenderness to palpation over the upper abdomen. Peristalsis is normoactive. Extremities have 2+ edema, full range of motion is present. Skin is warm and dry without rash. Neurologic: Mental status is normal, cranial nerves are intact, there are no motor or sensory deficits.  ED Course  Procedures (including critical care time) Labs Review Results for orders placed during the hospital encounter of 02/03/14  CBC WITH DIFFERENTIAL       Result Value Ref Range   WBC 7.1  4.0 - 10.5 K/uL   RBC 3.72 (*) 3.87 - 5.11 MIL/uL   Hemoglobin  11.3 (*) 12.0 - 15.0 g/dL   HCT 37.5  36.0 - 46.0 %   MCV 100.8 (*) 78.0 - 100.0 fL   MCH 30.4  26.0 - 34.0 pg   MCHC 30.1  30.0 - 36.0 g/dL   RDW 19.8 (*) 11.5 - 15.5 %   Platelets 245  150 - 400 K/uL   Neutrophils Relative % 71  43 - 77 %   Neutro Abs 5.1  1.7 - 7.7 K/uL   Lymphocytes Relative 17  12 - 46 %   Lymphs Abs 1.2  0.7 - 4.0 K/uL   Monocytes Relative 6  3 - 12 %   Monocytes Absolute 0.4  0.1 - 1.0 K/uL   Eosinophils Relative 5  0 - 5 %   Eosinophils Absolute 0.4  0.0 - 0.7 K/uL   Basophils Relative 1  0 - 1 %   Basophils Absolute 0.1  0.0 - 0.1 K/uL  COMPREHENSIVE METABOLIC PANEL      Result Value Ref Range   Sodium 139  137 - 147 mEq/L   Potassium 4.3  3.7 - 5.3 mEq/L   Chloride 97  96 - 112 mEq/L   CO2 28  19 - 32 mEq/L   Glucose, Bld 117 (*) 70 - 99 mg/dL   BUN 56 (*) 6 - 23 mg/dL   Creatinine, Ser 5.56 (*) 0.50 - 1.10 mg/dL   Calcium 10.3  8.4 - 10.5 mg/dL   Total Protein 6.4  6.0 - 8.3 g/dL   Albumin 2.5 (*) 3.5 - 5.2 g/dL   AST 16  0 - 37 U/L   ALT 13  0 - 35 U/L   Alkaline Phosphatase 117  39 - 117 U/L   Total Bilirubin 0.5  0.3 - 1.2 mg/dL   GFR calc non Af Amer 8 (*) >90 mL/min   GFR calc Af Amer 10 (*) >90 mL/min   Anion gap 14  5 - 15  LIPASE, BLOOD      Result Value Ref Range   Lipase 12  11 - 59 U/L  URINALYSIS, ROUTINE W REFLEX MICROSCOPIC      Result Value Ref Range   Color, Urine STRAW (*) YELLOW   APPearance CLEAR  CLEAR   Specific Gravity, Urine 1.036 (*) 1.005 - 1.030   pH 5.5  5.0 - 8.0   Glucose, UA >1000 (*) NEGATIVE mg/dL   Hgb urine dipstick NEGATIVE  NEGATIVE   Bilirubin Urine NEGATIVE  NEGATIVE   Ketones, ur NEGATIVE  NEGATIVE mg/dL   Protein, ur NEGATIVE  NEGATIVE mg/dL   Urobilinogen, UA 0.2  0.0 - 1.0 mg/dL   Nitrite NEGATIVE  NEGATIVE   Leukocytes, UA NEGATIVE  NEGATIVE  URINE MICROSCOPIC-ADD ON      Result Value Ref Range    WBC, UA 3-6  <3 WBC/hpf   RBC / HPF 0-2  <3 RBC/hpf    MDM   Final diagnoses:  Lower abdominal pain  Cellulitis, abdominal wall  End stage renal disease on dialysis    Abdominal pain which seems to be related to cellulitis which has been worsening in spite of outpatient antibiotics. Because of difficulty controlling pain and failure of outpatient antibiotics, I feel that she will need to be readmitted. She is given a dose of vancomycin in the ED and was given hydromorphone for pain. Old records are reviewed confirming recent hospitalization for abdominal pain which is felt to be due to cellulitis. A CT scan showed no other acute changes  at that time. Currently, WBC is not elevated and I do not see an indication for repeat CT scan. Case is discussed with Dr. Arnoldo Morale of fat was increased to admit the patient under observation status. At this point, and vancomycin proves to be the appropriate medication, her only issue will be pain control since vancomycin can be given at the completion of each dialysis and would not require additional injections.    Delora Fuel, MD 74/[id number]

## 2014-02-03 NOTE — Progress Notes (Signed)
Admitted after midnight.  Chart reviewed. Discussed with nephrology. Having stools. Pain is on pannus in the center. Mild erythema. Mild tenderness. Continue vancomycin for now. Resume zosyn for today as well.    Doree Barthel, MD [facility] [phone]

## 2014-02-04 LAB — RENAL FUNCTION PANEL
ALBUMIN: 2.6 g/dL — AB (ref 3.5–5.2)
Anion gap: 18 — ABNORMAL HIGH (ref 5–15)
BUN: 71 mg/dL — ABNORMAL HIGH (ref 6–23)
CHLORIDE: 91 meq/L — AB (ref 96–112)
CO2: 25 mEq/L (ref 19–32)
Calcium: 9.6 mg/dL (ref 8.4–10.5)
Creatinine, Ser: 6.83 mg/dL — ABNORMAL HIGH (ref 0.50–1.10)
GFR calc Af Amer: 8 mL/min — ABNORMAL LOW (ref >90)
GFR, EST NON AFRICAN AMERICAN: 7 mL/min — AB (ref >90)
Glucose, Bld: 206 mg/dL — ABNORMAL HIGH (ref 70–99)
Phosphorus: 6.6 mg/dL — ABNORMAL HIGH (ref 2.3–4.6)
Potassium: 4.7 mEq/L (ref 3.7–5.3)
Sodium: 134 mEq/L — ABNORMAL LOW (ref 137–147)

## 2014-02-04 LAB — HEMOGLOBIN A1C
HEMOGLOBIN A1C: 7.8 % — AB (ref <5.7)
MEAN PLASMA GLUCOSE: 177 mg/dL — AB (ref <117)

## 2014-02-04 LAB — GLUCOSE, CAPILLARY
Glucose-Capillary: 157 mg/dL — ABNORMAL HIGH (ref 70–99)
Glucose-Capillary: 122 mg/dL — ABNORMAL HIGH (ref 70–99)

## 2014-02-04 LAB — CBC
HCT: 34.7 % — ABNORMAL LOW (ref 36.0–46.0)
HEMOGLOBIN: 10.5 g/dL — AB (ref 12.0–15.0)
MCH: 30.3 pg (ref 26.0–34.0)
MCHC: 30.3 g/dL (ref 30.0–36.0)
MCV: 100.3 fL — AB (ref 78.0–100.0)
PLATELETS: 283 10*3/uL (ref 150–400)
RBC: 3.46 MIL/uL — ABNORMAL LOW (ref 3.87–5.11)
RDW: 19.7 % — ABNORMAL HIGH (ref 11.5–15.5)
WBC: 6.8 10*3/uL (ref 4.0–10.5)

## 2014-02-04 MED ORDER — LIDOCAINE-PRILOCAINE 2.5-2.5 % EX CREA: 1.0000 "application " | TOPICAL_CREAM | CUTANEOUS | Status: DC | PRN

## 2014-02-04 MED ORDER — HEPARIN SODIUM (PORCINE) 1000 UNIT/ML DIALYSIS
5000.0000 [IU] | Freq: Once | INTRAMUSCULAR | Status: AC
  Administered 2014-02-04: 5000 [IU] via INTRAVENOUS_CENTRAL
  Filled 2014-02-04: qty 5

## 2014-02-04 MED ORDER — SODIUM CHLORIDE 0.9 % IV SOLN: 100.0000 mL | INTRAVENOUS | Status: DC | PRN

## 2014-02-04 MED ORDER — NEPRO/CARBSTEADY PO LIQD: 237.0000 mL | ORAL | Status: DC | PRN

## 2014-02-04 MED ORDER — ACETAMINOPHEN 325 MG PO TABS: 650.0000 mg | ORAL_TABLET | Freq: Four times a day (QID) | ORAL | Status: DC | PRN

## 2014-02-04 MED ORDER — LIDOCAINE HCL (PF) 1 % IJ SOLN: 5.0000 mL | INTRAMUSCULAR | Status: DC | PRN

## 2014-02-04 MED ORDER — ALTEPLASE 2 MG IJ SOLR: 2.0000 mg | Freq: Once | INTRAMUSCULAR | Status: DC | PRN

## 2014-02-04 MED ORDER — HYDROMORPHONE HCL 2 MG PO TABS: 2.0000 mg | ORAL_TABLET | ORAL | Status: DC | PRN

## 2014-02-04 MED ORDER — PENTAFLUOROPROP-TETRAFLUOROETH EX AERO: 1.0000 "application " | INHALATION_SPRAY | CUTANEOUS | Status: DC | PRN

## 2014-02-04 MED ORDER — NEPRO/CARBSTEADY PO LIQD
237.0000 mL | Freq: Two times a day (BID) | ORAL | Status: DC
  Administered 2014-02-04: 237 mL via ORAL

## 2014-02-04 MED ORDER — HYDROMORPHONE HCL 1 MG/ML IJ SOLN
INTRAMUSCULAR | Status: AC
  Filled 2014-02-04: qty 1

## 2014-02-04 MED ORDER — HEPARIN SODIUM (PORCINE) 1000 UNIT/ML DIALYSIS
1000.0000 [IU] | INTRAMUSCULAR | Status: DC | PRN
  Filled 2014-02-04: qty 1

## 2014-02-04 NOTE — Progress Notes (Signed)
[facility] Progress Note  Assessment/Plan: 1. LLQ pain/cellulitis -CT 10/13 showed inflammatory changes around RLQ scar, suggesting cellulitis, and only stool on L, symptoms improved s/p enema; received IV Vancomycin & Zosyn before DC on PO Augmentin & Doxycycline, now back on IV Vanc and Zosyn. Afebrile - no leukocytosis - discomfort disproportionate to visible exam 2. ESRD - MWF - HD today- ^ goal to 4.5 - evaluate edw post HD - needs to be titrated down as an outpt due to severe LE edema 3. Anemia - Hgb stable 11; Aranesp and venofer 4. Secondary hyperparathyroidism - corrected Ca 11.1 - Hectorol on hold - use 2 Ca bath -on phoslo 5. Hypotension/volume - chronic issue, on midodrine- ^^ volume on exam - have ^ goal to 4.5 as BP tolerates- wts different from outpt EDW - have requested HD RN get standing post HD weight; chronic tachycardia - during prior admissions too 6. Nutrition - renal diet + vitamin - add supplements 7. Dialysis access - L IJ catheter; AVF @ [facility] placed 9/17, ligated 9/18 sec to steal, follow-up 10/29 8. Type 1 DM - per primary 9. CAD = 3 vessel disease - no targets for revascularization per May 2015 cath 10 PVD bilateral carotid stenoses - for future left stent by Dr. Trula Slade 41 - 99% 11 S/p acute biliary colic 11/4663 - resolved with medical management 12.Chronic systolic-diastolic CHF/ ICCM - EF 99-35% per echo 10/2013   [name], PA-C [facility] Beeper [phone] 02/04/2014,9:04 AM  LOS: 1 day   Pt seen, examined and agree w A/P as above.  [name] MD pager [phone]    cell [phone] 02/04/2014, 1:48 PM     Subjective:   Reports sharp pain in LLQ - doesn't feel any better  Objective Filed Vitals:   02/03/14 1849 02/03/14 2137 02/04/14 0522 02/04/14 0823  BP: 103/68 120/77 133/70 137/74  Pulse: 108 112 108 110  Temp: 98.3 F (36.8 C) 97.9 F (36.6 C) 98 F (36.7 C) 97.8 F (36.6 C)  TempSrc: Oral Oral Oral  Oral  Resp: 16 16 14 16   Height:  5\' 7"  (1.702 m)    Weight:  102 kg (224 lb 13.9 oz)    SpO2: 96% 96% 98% 100%   Physical Exam goal 3.5 VP 25- QB 350  General: on HD Heart: tach regular Lungs: no wheezes or rales Abdomen: soft - pain in LLQ - erythema only mild - mulitple nodular lumpy area - some more tender than others - (does not give insulin injections in lower abdomen) Extremities: 3+ edema bilaterally - dressings on right LE Dialysis Access: left I-J cath   Dialysis Orders: MWF @ [facility]  4 hrs 116 kg 2K/2.25Ca 400/A1.5 Heparin 5000 U L IJ catheter  Hectorol 5 mcg Aranesp 120 mcg & Venofer 50 mg on Wed  Additional Objective Labs: Basic Metabolic Panel:  Recent Labs Lab 01/30/14 0500 02/03/14 0012 02/03/14 0745  NA 134* 139 137  K 4.5 4.3 4.3  CL 91* 97 96  CO2 24 28 25   GLUCOSE 199* 117* 123*  BUN 62* 56* 58*  CREATININE 5.78* 5.56* 5.77*  CALCIUM 9.1 10.3 9.9   Liver Function Tests:  Recent Labs Lab 01/28/14 2117 01/29/14 0826 02/03/14 0012  AST 22 18 16   ALT 16 15 13   ALKPHOS 123* 117 117  BILITOT 0.7 0.6 0.5  PROT 6.3 6.0 6.4  ALBUMIN 2.7* 2.6* 2.5*    Recent Labs Lab 02/03/14 0012  LIPASE 12  CBC:  Recent Labs Lab 01/28/14 2117 01/29/14 0826 01/30/14 0500 02/03/14 0012 02/03/14 0745  WBC 7.2 6.1 5.6 7.1 6.9  NEUTROABS 6.0 4.4  --  5.1  --   HGB 11.0* 10.2* 10.3* 11.3* 11.0*  HCT 35.3* 33.2* 34.1* 37.5 35.8*  MCV 96.2 95.7 96.1 100.8* 97.3  PLT 226 221 221 245 255   CBG:  Recent Labs Lab 01/30/14 1300 02/03/14 1236 02/03/14 1632 02/03/14 2116 02/04/14 0822  GLUCAP 105* 243* 285* 301* 157*  Medications:   . aspirin EC  81 mg Oral Daily  . atorvastatin  80 mg Oral q1800  . calcium acetate  1,334 mg Oral TID WC  . enoxaparin (LOVENOX) injection  30 mg Subcutaneous Q24H  . insulin aspart  0-5 Units Subcutaneous QHS  . insulin aspart  0-9 Units Subcutaneous TID WC  . insulin detemir  20 Units Subcutaneous QPM  . midodrine   5 mg Oral Once per day on Mon Wed Fri  . multivitamin  1 tablet Oral QHS  . pantoprazole  40 mg Oral Daily  . piperacillin-tazobactam (ZOSYN)  IV  2.25 g Intravenous 3 times per day  . sodium chloride  3 mL Intravenous Q12H  . ursodiol  300 mg Oral BID  . vancomycin  1,000 mg Intravenous Q M,W,F-HD

## 2014-02-04 NOTE — Progress Notes (Signed)
Patient Discharge:  Disposition: Pt discharged home with husband  Education: Pt and husband educated on diagnosis, medications, follow up appointments and all discharge instructions.  IV: Removed  Telemetry: N/A  Follow-up appointments: Reviewed with pt and husband  Prescriptions: Scripts given to pt  Transportation: Transported home via car by husband  Belongings:All jewelry, clothing, and all other belongings taken with pt.

## 2014-02-04 NOTE — Discharge Summary (Signed)
Physician Discharge Summary  GEM CONKLE FTD:[id number] DOB: [birth year] DOA: 02/03/2014  PCP: Vic Blackbird, MD  Admit date: 02/03/2014 Discharge date: 02/04/2014  Time spent: greater than 30 minutes  Discharge Diagnoses:  Principal Problem:   Cellulitis, abdominal wall Active Problems:   Diabetic retinopathy associated with type 2 diabetes mellitus   Diabetes   Essential hypertension, benign   ESRD (end stage renal disease) on dialysis   Chronic combined systolic and diastolic CHF, NYHA class 3   Cardiomyopathy, ischemic- EF 25-30% July 2015   Cellulitis   Lower abdominal pain   Discharge Condition: stable  Filed Weights   02/03/14 2137 02/04/14 0924 02/04/14 1337  Weight: 102 kg (224 lb 13.9 oz) 96 kg (211 lb 10.3 oz) 88.7 kg (195 lb 8.8 oz)    History of present illness:  43 y.o. female with history of ESRD on HD, CAD, DM2, HTN who was recently discharged from the hospital 3 days ago to continue on Augmentin and Doxycycline Rx who presents due to increased pain and redness of the Lower ABD. She denies any fevers or chills. She had increased redness now for a total of 10 days, and was referred for admission due to failure of outpatient rx for her Cellultiis  Hospital Course:  Started on vancomycin, zosyn and pain medication. Erythema was minimal when I examined patient, but patient continued to complain of pain. Appears comfortable on exam. May discharge home on same abx. Had dialysis per usual schedule  Procedures:  hemodialysis  Consultations:  nephrology  Discharge Exam: Filed Vitals:   02/04/14 1337  BP: 127/73  Pulse: 106  Temp: 97.5 F (36.4 C)  Resp: 16    General: comfortable Cardiovascular: rRR Respiratory: CTA abd no warmth. Minimal erythema midportion of panus. Ext edema    Discharge Instructions   Discharge instructions    Complete by:  As directed   Renal diabetic diet     Increase activity slowly    Complete by:  As directed            Current Discharge Medication List    START taking these medications   Details  acetaminophen (TYLENOL) 325 MG tablet Take 2 tablets (650 mg total) by mouth every 6 (six) hours as needed for mild pain (or Fever >/= 101).    HYDROmorphone (DILAUDID) 2 MG tablet Take 1-2 tablets (2-4 mg total) by mouth every 4 (four) hours as needed for severe pain. Qty: 30 tablet, Refills: 0      CONTINUE these medications which have NOT CHANGED   Details  amoxicillin-clavulanate (AUGMENTIN) 875-125 MG per tablet Take 1 tablet by mouth 2 (two) times daily. Qty: 10 tablet, Refills: 0    aspirin EC 81 MG EC tablet Take 1 tablet (81 mg total) by mouth daily. Qty: 30 tablet, Refills: 0    atorvastatin (LIPITOR) 80 MG tablet Take 1 tablet (80 mg total) by mouth daily at 6 PM. Qty: 30 tablet, Refills: 6    calcium acetate (PHOSLO) 667 MG capsule Take 2 capsules (1,334 mg total) by mouth 3 (three) times daily with meals. Qty: 90 capsule, Refills: 0    cyclobenzaprine (FLEXERIL) 10 MG tablet Take 10 mg by mouth 3 (three) times daily as needed for muscle spasms.    Docusate Calcium (STOOL SOFTENER PO) Take 1-3 capsules by mouth daily as needed (constipation).    doxycycline (VIBRA-TABS) 100 MG tablet Take 1 tablet (100 mg total) by mouth 2 (two) times daily. Qty: 10 tablet, Refills: 0  Insulin Detemir (LEVEMIR FLEXPEN) 100 UNIT/ML Pen Inject 20 Units into the skin every evening. Qty: 15 mL, Refills: 11    insulin lispro (HUMALOG KWIKPEN) 100 UNIT/ML KiwkPen Inject 0.05 mLs (5 Units total) into the skin 3 (three) times daily with meals. Qty: 15 mL, Refills: 6    methylcellulose (ARTIFICIAL TEARS) 1 % ophthalmic solution Place 1 drop into both eyes at bedtime as needed (dry eyes).    midodrine (PROAMATINE) 10 MG tablet Take 5 mg by mouth 3 (three) times a week. Monday, Wednesday and Friday    multivitamin (RENA-VIT) TABS tablet Take 1 tablet by mouth daily.    pantoprazole (PROTONIX) 40 MG  tablet Take 1 tablet (40 mg total) by mouth daily. Qty: 30 tablet, Refills: 6    polyethylene glycol (MIRALAX / GLYCOLAX) packet Take 17 g by mouth daily as needed (constipation). Qty: 14 each, Refills: 6    promethazine (PHENERGAN) 25 MG tablet Take 1 tablet (25 mg total) by mouth every 8 (eight) hours as needed for nausea or vomiting. Qty: 20 tablet, Refills: 0    ursodiol (ACTIGALL) 300 MG capsule Take 1 capsule (300 mg total) by mouth 2 (two) times daily. Qty: 60 capsule, Refills: 4      STOP taking these medications     HYDROcodone-acetaminophen (NORCO) 5-325 MG per tablet      traMADol (ULTRAM) 50 MG tablet        Allergies  Allergen Reactions  . Crestor [Rosuvastatin] Other (See Comments)    Severe muscle weakness  . Nsaids Other (See Comments)    Not allergic, "bad on my kidneys"  . Ciprofloxacin Rash   Follow-up Information   Follow up with Vic Blackbird, MD.   Specialty:  Family Medicine   Contact information:   [address] [place] [address] [phone]        The results of significant diagnostics from this hospitalization (including imaging, microbiology, ancillary and laboratory) are listed below for reference.    Significant Diagnostic Studies: Ct Abdomen Pelvis W Contrast  01/29/2014   CLINICAL DATA:  Lower quadrant abdominal pain.  Initial encounter.  EXAM: CT ABDOMEN AND PELVIS WITH CONTRAST  TECHNIQUE: Multidetector CT imaging of the abdomen and pelvis was performed using the standard protocol following bolus administration of intravenous contrast.  CONTRAST:  172mL OMNIPAQUE IOHEXOL 300 MG/ML  SOLN  COMPARISON:  11/25/2013  FINDINGS: BODY WALL: At the site of soft tissue defect in the right lower quadrant/right groin, there is increased subcutaneous reticulation correlating with history of the erythema. Soft tissue density at the base of the defect has also increased, presumably scarring.No evidence of bowel fistulization or abscess.   LOWER CHEST: 12 mm nodule in the left lower lobe is unchanged compared recent imaging. This is likely the patient's treated lung cancer. Chronic, small bilateral pleural effusions.  ABDOMEN/PELVIS:  Liver: No focal abnormality.  Biliary: Cholelithiasis.  No definitive cholecystitis.  Pancreas: Unremarkable.  Spleen: Unremarkable.  Adrenals: Unremarkable.  Kidneys and ureters: 5.4 cm cyst/s exophytic from the interpolar left kidney are not significantly changed. There is fine mural calcification best visualized coronally.  Bladder: Unremarkable.  Reproductive: Unremarkable.  Bowel: No obstruction. Negative appendix.  Retroperitoneum: No mass or adenopathy.  Peritoneum: No ascites or pneumoperitoneum.  Vascular: Fusiform enlargement of the right common femoral artery to 15 mm. Diffuse atherosclerosis.  OSSEOUS: No acute abnormalities.  IMPRESSION: 1. Inflammatory changes around the right lower quadrant scar could reflect cellulitis. No soft tissue abscess. 2. Cholelithiasis.  3. 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 4. Chronic bilateral pleural effusions   Electronically Signed   By: Jorje Guild M.D.   On: 01/29/2014 03:55    Microbiology: Recent Results (from the past 240 hour(s))  MRSA PCR SCREENING     Status: None   Collection Time    02/03/14  3:14 AM      Result Value Ref Range Status   MRSA by PCR NEGATIVE  NEGATIVE Final   Comment:            The GeneXpert MRSA Assay (FDA     approved for NASAL specimens     only), is one component of a     comprehensive MRSA colonization     surveillance program. It is not     intended to diagnose MRSA     infection nor to guide or     monitor treatment for     MRSA infections.     Labs: Basic Metabolic Panel:  Recent Labs Lab 01/29/14 0826 01/30/14 0500 02/03/14 0012 02/03/14 0745 02/04/14 0800  NA 136* 134* 139 137 134*  K 4.1 4.5 4.3 4.3 4.7  CL 91* 91* 97 96 91*  CO2 29 24 28 25 25   GLUCOSE 238*  199* 117* 123* 206*  BUN 47* 62* 56* 58* 71*  CREATININE 4.54* 5.78* 5.56* 5.77* 6.83*  CALCIUM 8.7 9.1 10.3 9.9 9.6  PHOS  --   --   --   --  6.6*   Liver Function Tests:  Recent Labs Lab 01/28/14 2117 01/29/14 0826 02/03/14 0012 02/04/14 0800  AST 22 18 16   --   ALT 16 15 13   --   ALKPHOS 123* 117 117  --   BILITOT 0.7 0.6 0.5  --   PROT 6.3 6.0 6.4  --   ALBUMIN 2.7* 2.6* 2.5* 2.6*    Recent Labs Lab 02/03/14 0012  LIPASE 12   No results found for this basename: AMMONIA,  in the last 168 hours CBC:  Recent Labs Lab 01/28/14 2117 01/29/14 0826 01/30/14 0500 02/03/14 0012 02/03/14 0745 02/04/14 0800  WBC 7.2 6.1 5.6 7.1 6.9 6.8  NEUTROABS 6.0 4.4  --  5.1  --   --   HGB 11.0* 10.2* 10.3* 11.3* 11.0* 10.5*  HCT 35.3* 33.2* 34.1* 37.5 35.8* 34.7*  MCV 96.2 95.7 96.1 100.8* 97.3 100.3*  PLT 226 221 221 245 255 283   Cardiac Enzymes: No results found for this basename: CKTOTAL, CKMB, CKMBINDEX, TROPONINI,  in the last 168 hours BNP: BNP (last 3 results)  Recent Labs  08/13/13 1949 09/06/13 0740  PROBNP 34187.0* 28622.0*   CBG:  Recent Labs Lab 02/03/14 1236 02/03/14 1632 02/03/14 2116 02/04/14 0822 02/04/14 1427  GLUCAP 243* 285* 301* 157* 122*       Signed:  Fallis L  [facility] 02/04/2014, 2:56 PM

## 2014-02-04 NOTE — Telephone Encounter (Signed)
encounter open in error

## 2014-02-04 NOTE — Progress Notes (Signed)
[facility]  Patient Status: Active (receiving services up to time of hospitalization)  [facility] is providing the following services: RN  If patient discharges after hours, please call [phone].   Hannah [name] 02/04/2014, 10:30 AM

## 2014-02-05 ENCOUNTER — Emergency Department (HOSPITAL_COMMUNITY)
Admission: EM | Admit: 2014-02-05 | Discharge: 2014-02-05 | Disposition: A | Discharge status: Home / Self Care | Payer: Medicare Other | Attending: Emergency Medicine | Admitting: Emergency Medicine

## 2014-02-05 ENCOUNTER — Encounter (HOSPITAL_COMMUNITY): Payer: Self-pay | Admitting: Emergency Medicine

## 2014-02-05 ENCOUNTER — Encounter (HOSPITAL_COMMUNITY): Admission: RE | Admit: 2014-02-05 | Payer: Medicare Other | Source: Ambulatory Visit

## 2014-02-05 DIAGNOSIS — I120 Hypertensive chronic kidney disease with stage 5 chronic kidney disease or end stage renal disease: Secondary | ICD-10-CM | POA: Diagnosis not present

## 2014-02-05 DIAGNOSIS — H5441 Blindness, right eye, normal vision left eye: Secondary | ICD-10-CM | POA: Diagnosis not present

## 2014-02-05 DIAGNOSIS — Z85118 Personal history of other malignant neoplasm of bronchus and lung: Secondary | ICD-10-CM | POA: Insufficient documentation

## 2014-02-05 DIAGNOSIS — Z862 Personal history of diseases of the blood and blood-forming organs and certain disorders involving the immune mechanism: Secondary | ICD-10-CM | POA: Insufficient documentation

## 2014-02-05 DIAGNOSIS — K219 Gastro-esophageal reflux disease without esophagitis: Secondary | ICD-10-CM | POA: Diagnosis not present

## 2014-02-05 DIAGNOSIS — I252 Old myocardial infarction: Secondary | ICD-10-CM | POA: Diagnosis not present

## 2014-02-05 DIAGNOSIS — I509 Heart failure, unspecified: Secondary | ICD-10-CM | POA: Insufficient documentation

## 2014-02-05 DIAGNOSIS — E11319 Type 2 diabetes mellitus with unspecified diabetic retinopathy without macular edema: Secondary | ICD-10-CM | POA: Diagnosis not present

## 2014-02-05 DIAGNOSIS — G629 Polyneuropathy, unspecified: Secondary | ICD-10-CM | POA: Diagnosis not present

## 2014-02-05 DIAGNOSIS — B86 Scabies: Secondary | ICD-10-CM | POA: Diagnosis not present

## 2014-02-05 DIAGNOSIS — Z872 Personal history of diseases of the skin and subcutaneous tissue: Secondary | ICD-10-CM | POA: Insufficient documentation

## 2014-02-05 DIAGNOSIS — Z7982 Long term (current) use of aspirin: Secondary | ICD-10-CM | POA: Diagnosis not present

## 2014-02-05 DIAGNOSIS — H269 Unspecified cataract: Secondary | ICD-10-CM | POA: Diagnosis not present

## 2014-02-05 DIAGNOSIS — Z87891 Personal history of nicotine dependence: Secondary | ICD-10-CM | POA: Insufficient documentation

## 2014-02-05 DIAGNOSIS — Z9889 Other specified postprocedural states: Secondary | ICD-10-CM | POA: Insufficient documentation

## 2014-02-05 DIAGNOSIS — Z8719 Personal history of other diseases of the digestive system: Secondary | ICD-10-CM | POA: Insufficient documentation

## 2014-02-05 DIAGNOSIS — E780 Pure hypercholesterolemia: Secondary | ICD-10-CM | POA: Insufficient documentation

## 2014-02-05 DIAGNOSIS — I2510 Atherosclerotic heart disease of native coronary artery without angina pectoris: Secondary | ICD-10-CM | POA: Insufficient documentation

## 2014-02-05 DIAGNOSIS — N185 Chronic kidney disease, stage 5: Secondary | ICD-10-CM | POA: Insufficient documentation

## 2014-02-05 DIAGNOSIS — R21 Rash and other nonspecific skin eruption: Secondary | ICD-10-CM | POA: Diagnosis present

## 2014-02-05 MED ORDER — PERMETHRIN 5 % EX CREA: TOPICAL_CREAM | CUTANEOUS | Status: DC

## 2014-02-05 NOTE — Discharge Instructions (Signed)
Scabies Scabies are small bugs (mites) that burrow under the skin and cause red bumps and severe itching. These bugs can only be seen with a microscope. Scabies are highly contagious. They can spread easily from person to person by direct contact. They are also spread through sharing clothing or linens that have the scabies mites living in them. It is not unusual for an entire family to become infected through shared towels, clothing, or bedding.  HOME CARE INSTRUCTIONS   Your caregiver may prescribe a cream or lotion to kill the mites. If cream is prescribed, massage the cream into the entire body from the neck to the bottom of both feet. Also massage the cream into the scalp and face if your child is less than 1 year old. Avoid the eyes and mouth. Do not wash your hands after application.  Leave the cream on for 8 to 12 hours. Your child should bathe or shower after the 8 to 12 hour application period. Sometimes it is helpful to apply the cream to your child right before bedtime.  One treatment is usually effective and will eliminate approximately 95% of infestations. For severe cases, your caregiver may decide to repeat the treatment in 1 week. Everyone in your household should be treated with one application of the cream.  New rashes or burrows should not appear within 24 to 48 hours after successful treatment. However, the itching and rash may last for 2 to 4 weeks after successful treatment. Your caregiver may prescribe a medicine to help with the itching or to help the rash go away more quickly.  Scabies can live on clothing or linens for up to 3 days. All of your child's recently used clothing, towels, stuffed toys, and bed linens should be washed in hot water and then dried in a dryer for at least 20 minutes on high heat. Items that cannot be washed should be enclosed in a plastic bag for at least 3 days.  To help relieve itching, bathe your child in a cool bath or apply cool washcloths to the  affected areas.  Your child may return to school after treatment with the prescribed cream. SEEK MEDICAL CARE IF:   The itching persists longer than 4 weeks after treatment.  The rash spreads or becomes infected. Signs of infection include red blisters or yellow-tan crust. Document Released: 04/05/2005 Document Revised: 06/28/2011 Document Reviewed: 08/14/2008 ExitCare Patient Information 2015 [employer]. This information is not intended to replace advice given to you by your health care provider. Make sure you discuss any questions you have with your health care provider.  

## 2014-02-05 NOTE — ED Notes (Signed)
Pt c/o itching for 2 weeks, states granddaughter dx with scabies yesterday, pt states granddaughter is at pt's house every other day

## 2014-02-05 NOTE — ED Provider Notes (Signed)
CSN: [id number]     Arrival date & time 02/05/14  1122 History  This chart was scribed for non-physician practitioner Lenard Galloway, working with Sharyon Cable, MD by Ludger Nutting, ED Scribe. This patient was seen in room APFT22/APFT22 and the patient's care was started at 12:02 PM.    No chief complaint on file.  The history is provided by the patient. No language interpreter was used.    HPI Comments: Hannah [name] is a 43 y.o. female who presents to the Emergency Department complaining of 7 days of gradual onset, gradually worsening, constant rash to the back. He has associated itching and has not tried any OTC medications for her symptoms. She reports exposure to someone with known scabies. She denies fever.    Past Medical History  Diagnosis Date  . High cholesterol   . Diabetic retinopathy   . Peripheral neuropathy     "tips of toes"  . Blind right eye   . CHF (congestive heart failure)   . CAD (coronary artery disease)   . GERD (gastroesophageal reflux disease)   . Hypertension   . History of lung cancer 07/2011    s/p left lower lobectomy  . Diabetes mellitus     IDDM  . Cataract of right eye   . Ulcer of toe of right foot 07/10/2012    great toe  . Breast calcification, left 06/2012  . Acute biphenotypic leukemia   . CKD (chronic kidney disease) stage 5, GFR less than 15 ml/min   . Nephrotic syndrome   . Gastritis     H/o gastritis on prior endoscopy  . Anemia   . Carotid artery disease   . Gallstones   . Myocardial infarction 5/16   Past Surgical History  Procedure Laterality Date  . Cardiac catheterization  07/16/2011  . Incision and drainage breast abscess Left   . Tubal ligation  1994  . Vitrectomy  2010    2 on left, 1 on right  . Cesarean section  1991; 1994  . Video assisted thoracoscopy (vats)/ lobectomy Left 07/30/2011    left main thoracotomy, left lower lobectomy, mediastinal lymph node dissection  . Lobectomy    . Colonoscopy with  esophagogastroduodenoscopy (egd) N/A 08/14/2012    Procedure: COLONOSCOPY WITH ESOPHAGOGASTRODUODENOSCOPY (EGD);  Surgeon: Danie Binder, MD;  [facility];  Service: Endoscopy;  Laterality: N/A;  10:45-moved to [address] to notify pt  . Breast lumpectomy with needle localization Left 11/14/2012    Procedure: BREAST LUMPECTOMY WITH NEEDLE LOCALIZATION;  Surgeon: Marcello Moores A. Cornett, MD;  Location: [place];  Service: General;  Laterality: Left;  . Insertion of dialysis catheter Left 09/12/2013    Procedure: INSERTION OF DIALYSIS CATHETER;  Surgeon: Mal Misty, MD;  Location: [place];  Service: Vascular;  Laterality: Left;  . Embolectomy Right 09/17/2013    Procedure: Thrombectomy of Right Common Femoral Artery;  Surgeon: Elam Dutch, MD;  Location: [facility] OR;  Service: Vascular;  Laterality: Right;  . Endarterectomy femoral Right 09/17/2013    Procedure: Right Femoral Endarterectomy;  Surgeon: Elam Dutch, MD;  Location: [facility] OR;  Service: Vascular;  Laterality: Right;  . Patch angioplasty Right 09/17/2013    Procedure: Vein Patch Angioplasty of Right Femoral Artery;  Surgeon: Elam Dutch, MD;  Location: [place];  Service: Vascular;  Laterality: Right;  . Fasciotomy Right 09/17/2013    Procedure: Four Compartment Fasciotomy;  Surgeon: Elam Dutch, MD;  Location: [facility]  OR;  Service: Vascular;  Laterality: Right;  . Fasciotomy closure Right 09/19/2013    Procedure: FASCIOTOMY CLOSURE;  Surgeon: Elam Dutch, MD;  Location: [place];  Service: Vascular;  Laterality: Right;  regional block and monitored anesthesia care used  . I&d extremity Right 09/28/2013    Procedure: IRRIGATION AND DEBRIDEMENT EXTREMITY;  Surgeon: Serafina Mitchell, MD;  Location: [place];  Service: Vascular;  Laterality: Right;  . Application of wound vac Right 09/28/2013    Procedure: APPLICATION OF WOUND VAC;  Surgeon: Serafina Mitchell, MD;  Location: [place];  Service: Vascular;  Laterality: Right;  . Av fistula placement  Right 01/03/2014    Procedure: ARTERIOVENOUS (AV) FISTULA CREATION;  Surgeon: Angelia Mould, MD;  Location: [place];  Service: Vascular;  Laterality: Right;  . Ligation of arteriovenous  fistula Right 01/04/2014    Procedure: LIGATION OF ARTERIOVENOUS  FISTULA;  Surgeon: Rosetta Posner, MD;  Location: [facility] OR;  Service: Vascular;  Laterality: Right;   Family History  Problem Relation Age of Onset  . Coronary artery disease Father   . Asthma Father   . COPD Father   . Hypertension Father   . Hyperlipidemia Father   . Diabetes Father   . Congestive Heart Failure Father   . Heart disease Father     before age 45  . Heart attack Father   . Peripheral vascular disease Father   . Hypertension Mother   . Hyperlipidemia Mother   . Diabetes Mother   . Cancer Mother   . Cancer Maternal Aunt      three aunts, bone, breast, ?  . Hypertension Brother   . Diabetes Brother   . Diabetes Sister   . Colon cancer Neg Hx   . Celiac disease Neg Hx   . Crohn's disease Neg Hx   . Ulcerative colitis Neg Hx   . Lung cancer Maternal Grandmother    History  Substance Use Topics  . Smoking status: Former Smoker -- 2.00 packs/day for 30 years    Quit date: 08/01/2011  . Smokeless tobacco: Never Used  . Alcohol Use: No   OB History   Grav Para Term Preterm Abortions TAB SAB Ect Mult Living                 Review of Systems  Constitutional: Negative for fever.  Skin: Positive for rash.      Allergies  Crestor; Nsaids; and Ciprofloxacin  Home Medications   Prior to Admission medications   Medication Sig Start Date End Date Taking? Authorizing Provider  acetaminophen (TYLENOL) 325 MG tablet Take 2 tablets (650 mg total) by mouth every 6 (six) hours as needed for mild pain (or Fever >/= 101). 02/04/14   Delfina Redwood, MD  amoxicillin-clavulanate (AUGMENTIN) 875-125 MG per tablet Take 1 tablet by mouth 2 (two) times daily. 01/30/14   Kinnie Feil, MD  aspirin EC 81 MG EC tablet  Take 1 tablet (81 mg total) by mouth daily. 08/17/13   Belkys A Regalado, MD  atorvastatin (LIPITOR) 80 MG tablet Take 1 tablet (80 mg total) by mouth daily at 6 PM. 10/29/13   Amy D Clegg, NP  calcium acetate (PHOSLO) 667 MG capsule Take 2 capsules (1,334 mg total) by mouth 3 (three) times daily with meals. 11/10/13   Hosie Poisson, MD  cyclobenzaprine (FLEXERIL) 10 MG tablet Take 10 mg by mouth 3 (three) times daily as needed for muscle spasms.    Historical Provider,  MD  Docusate Calcium (STOOL SOFTENER PO) Take 1-3 capsules by mouth daily as needed (constipation).    Historical Provider, MD  doxycycline (VIBRA-TABS) 100 MG tablet Take 1 tablet (100 mg total) by mouth 2 (two) times daily. 01/30/14   Kinnie Feil, MD  HYDROmorphone (DILAUDID) 2 MG tablet Take 1-2 tablets (2-4 mg total) by mouth every 4 (four) hours as needed for severe pain. 02/04/14   Delfina Redwood, MD  Insulin Detemir (LEVEMIR FLEXPEN) 100 UNIT/ML Pen Inject 20 Units into the skin every evening. 10/29/13   Amy D Clegg, NP  insulin lispro (HUMALOG KWIKPEN) 100 UNIT/ML KiwkPen Inject 0.05 mLs (5 Units total) into the skin 3 (three) times daily with meals. 01/26/14   Alycia Rossetti, MD  methylcellulose (ARTIFICIAL TEARS) 1 % ophthalmic solution Place 1 drop into both eyes at bedtime as needed (dry eyes).    Historical Provider, MD  midodrine (PROAMATINE) 10 MG tablet Take 5 mg by mouth 3 (three) times a week. Monday, Wednesday and Friday    Historical Provider, MD  multivitamin (RENA-VIT) TABS tablet Take 1 tablet by mouth daily.    Historical Provider, MD  pantoprazole (PROTONIX) 40 MG tablet Take 1 tablet (40 mg total) by mouth daily. 10/29/13   Amy D Clegg, NP  polyethylene glycol (MIRALAX / GLYCOLAX) packet Take 17 g by mouth daily as needed (constipation). 10/29/13   Amy D Ninfa Meeker, NP  promethazine (PHENERGAN) 25 MG tablet Take 1 tablet (25 mg total) by mouth every 8 (eight) hours as needed for nausea or vomiting. 12/11/13    Susy Frizzle, MD  ursodiol (ACTIGALL) 300 MG capsule Take 1 capsule (300 mg total) by mouth 2 (two) times daily. 11/29/13   Ripudeep Krystal Eaton, MD   There were no vitals taken for this visit. Physical Exam  Nursing note and vitals reviewed. Constitutional: She is oriented to person, place, and time. She appears well-developed and well-nourished.  HENT:  Head: Normocephalic and atraumatic.  Cardiovascular: Normal rate.   Pulmonary/Chest: Effort normal.  Neurological: She is alert and oriented to person, place, and time.  Skin: Skin is warm and dry. Rash noted. Rash is maculopapular (to back and upper extremities).  Psychiatric: She has a normal mood and affect.    ED Course  Procedures (including critical care time)  DIAGNOSTIC STUDIES: Oxygen Saturation is 99% on RA, normal by my interpretation.    COORDINATION OF CARE: 12:04 PM Will discharge home with permethrin cream. Discussed treatment plan with pt at bedside and pt agreed to plan.   Labs Review Labs Reviewed - No data to display  Imaging Review No results found.   EKG Interpretation None      MDM   Final diagnoses:  None    1. Scabies  Characteristic rash of scabies with known home exposure with family member. Will treat with permethrin and in home care instruction.  I personally performed the services described in this documentation, which was scribed in my presence. The recorded information has been reviewed and is accurate.     Dewaine Oats, PA-C 02/05/14 1515

## 2014-02-06 NOTE — ED Provider Notes (Signed)
Medical screening examination/treatment/procedure(s) were performed by non-physician practitioner and as supervising physician I was immediately available for consultation/collaboration.   EKG Interpretation None        Sharyon Cable, MD 02/06/14 [phone]

## 2014-02-07 ENCOUNTER — Telehealth: Payer: Self-pay | Admitting: *Deleted

## 2014-02-07 MED ORDER — INSULIN ASPART 100 UNIT/ML FLEXPEN: PEN_INJECTOR | SUBCUTANEOUS | Status: AC

## 2014-02-07 NOTE — Telephone Encounter (Signed)
Received letter from patient insurance.   Reports that Humalog Insulin is no longer going to be covered. Advised to switch to Novolog.   MD made aware and new orders given to switch.   Prescription sent to pharmacy.   Patient made aware via MyChart.

## 2014-02-11 ENCOUNTER — Emergency Department (HOSPITAL_COMMUNITY)
Admission: EM | Admit: 2014-02-11 | Discharge: 2014-02-11 | Disposition: A | Discharge status: Home / Self Care | Payer: Medicare Other | Attending: Emergency Medicine | Admitting: Emergency Medicine

## 2014-02-11 ENCOUNTER — Encounter (HOSPITAL_COMMUNITY): Payer: Self-pay | Admitting: Emergency Medicine

## 2014-02-11 DIAGNOSIS — Z792 Long term (current) use of antibiotics: Secondary | ICD-10-CM | POA: Insufficient documentation

## 2014-02-11 DIAGNOSIS — R109 Unspecified abdominal pain: Secondary | ICD-10-CM | POA: Diagnosis present

## 2014-02-11 DIAGNOSIS — E780 Pure hypercholesterolemia: Secondary | ICD-10-CM | POA: Insufficient documentation

## 2014-02-11 DIAGNOSIS — I252 Old myocardial infarction: Secondary | ICD-10-CM | POA: Diagnosis not present

## 2014-02-11 DIAGNOSIS — Z87891 Personal history of nicotine dependence: Secondary | ICD-10-CM | POA: Insufficient documentation

## 2014-02-11 DIAGNOSIS — I2510 Atherosclerotic heart disease of native coronary artery without angina pectoris: Secondary | ICD-10-CM | POA: Diagnosis not present

## 2014-02-11 DIAGNOSIS — Z992 Dependence on renal dialysis: Secondary | ICD-10-CM | POA: Insufficient documentation

## 2014-02-11 DIAGNOSIS — Z9851 Tubal ligation status: Secondary | ICD-10-CM | POA: Insufficient documentation

## 2014-02-11 DIAGNOSIS — Z856 Personal history of leukemia: Secondary | ICD-10-CM | POA: Diagnosis not present

## 2014-02-11 DIAGNOSIS — Z79899 Other long term (current) drug therapy: Secondary | ICD-10-CM | POA: Insufficient documentation

## 2014-02-11 DIAGNOSIS — Z9889 Other specified postprocedural states: Secondary | ICD-10-CM | POA: Insufficient documentation

## 2014-02-11 DIAGNOSIS — Z902 Acquired absence of lung [part of]: Secondary | ICD-10-CM | POA: Diagnosis not present

## 2014-02-11 DIAGNOSIS — R1032 Left lower quadrant pain: Secondary | ICD-10-CM | POA: Diagnosis not present

## 2014-02-11 DIAGNOSIS — I120 Hypertensive chronic kidney disease with stage 5 chronic kidney disease or end stage renal disease: Secondary | ICD-10-CM | POA: Insufficient documentation

## 2014-02-11 DIAGNOSIS — K219 Gastro-esophageal reflux disease without esophagitis: Secondary | ICD-10-CM | POA: Insufficient documentation

## 2014-02-11 DIAGNOSIS — N186 End stage renal disease: Secondary | ICD-10-CM | POA: Insufficient documentation

## 2014-02-11 DIAGNOSIS — E11329 Type 2 diabetes mellitus with mild nonproliferative diabetic retinopathy without macular edema: Secondary | ICD-10-CM | POA: Diagnosis not present

## 2014-02-11 DIAGNOSIS — Z794 Long term (current) use of insulin: Secondary | ICD-10-CM | POA: Insufficient documentation

## 2014-02-11 DIAGNOSIS — I509 Heart failure, unspecified: Secondary | ICD-10-CM | POA: Insufficient documentation

## 2014-02-11 DIAGNOSIS — Z862 Personal history of diseases of the blood and blood-forming organs and certain disorders involving the immune mechanism: Secondary | ICD-10-CM | POA: Diagnosis not present

## 2014-02-11 DIAGNOSIS — Z872 Personal history of diseases of the skin and subcutaneous tissue: Secondary | ICD-10-CM | POA: Insufficient documentation

## 2014-02-11 DIAGNOSIS — Z7982 Long term (current) use of aspirin: Secondary | ICD-10-CM | POA: Insufficient documentation

## 2014-02-11 MED ORDER — OXYCODONE-ACETAMINOPHEN 5-325 MG PO TABS: 1.0000 | ORAL_TABLET | ORAL | Status: DC | PRN

## 2014-02-11 MED ORDER — OXYCODONE-ACETAMINOPHEN 5-325 MG PO TABS
2.0000 | ORAL_TABLET | Freq: Once | ORAL | Status: AC
  Administered 2014-02-11: 2 via ORAL
  Filled 2014-02-11: qty 2

## 2014-02-11 NOTE — ED Notes (Signed)
Patient reports left lower quadrant abdominal pain x approximately 14 days. Reports recently seen at [facility] for same.

## 2014-02-11 NOTE — ED Provider Notes (Signed)
CSN: [id number]     Arrival date & time 02/11/14  2022 History   First MD Initiated Contact with Patient 02/11/14 2042     Chief Complaint  Patient presents with  . Abdominal Pain     (Consider location/radiation/quality/duration/timing/severity/associated sxs/prior Treatment) HPI Comments: The patient is a 43 year old female who has a recent very complicated medical history including a surgical procedure to the right lower abdominal pelvic area for a blood clot, a fasciotomy to the right lower extremity, a myocardial infarction and who was recently admitted to the hospital for abdominal wall cellulitis. She was discharged on October 19 and presents today one week later stating that she still has pain in the left abdomen. She states that this pain has been there for 2 weeks, it was present when she was admitted to the hospital during which time a CT scan revealed no signs of subcutaneous emphysema, subcutaneous abscess or deep tissue infection. She received IV antibiotics and was discharged home. She states that the pain is present every day, it is focused in the left lower quadrant, it does not radiate and is not associated with vomiting diarrhea or fevers. She is upset because the pain is still present.  Patient is a 44 y.o. female presenting with abdominal pain. The history is provided by the patient, the spouse and medical records.  Abdominal Pain   Past Medical History  Diagnosis Date  . High cholesterol   . Diabetic retinopathy   . Peripheral neuropathy     "tips of toes"  . Blind right eye   . CHF (congestive heart failure)   . CAD (coronary artery disease)   . GERD (gastroesophageal reflux disease)   . Hypertension   . History of lung cancer 07/2011    s/p left lower lobectomy  . Diabetes mellitus     IDDM  . Cataract of right eye   . Ulcer of toe of right foot 07/10/2012    great toe  . Breast calcification, left 06/2012  . Acute biphenotypic leukemia   . CKD (chronic  kidney disease) stage 5, GFR less than 15 ml/min   . Nephrotic syndrome   . Gastritis     H/o gastritis on prior endoscopy  . Anemia   . Carotid artery disease   . Gallstones   . Myocardial infarction 5/16   Past Surgical History  Procedure Laterality Date  . Cardiac catheterization  07/16/2011  . Incision and drainage breast abscess Left   . Tubal ligation  1994  . Vitrectomy  2010    2 on left, 1 on right  . Cesarean section  1991; 1994  . Video assisted thoracoscopy (vats)/ lobectomy Left 07/30/2011    left main thoracotomy, left lower lobectomy, mediastinal lymph node dissection  . Lobectomy    . Colonoscopy with esophagogastroduodenoscopy (egd) N/A 08/14/2012    Procedure: COLONOSCOPY WITH ESOPHAGOGASTRODUODENOSCOPY (EGD);  Surgeon: Danie Binder, MD;  [facility];  Service: Endoscopy;  Laterality: N/A;  10:45-moved to [address] to notify pt  . Breast lumpectomy with needle localization Left 11/14/2012    Procedure: BREAST LUMPECTOMY WITH NEEDLE LOCALIZATION;  Surgeon: Marcello Moores A. Cornett, MD;  Location: [place];  Service: General;  Laterality: Left;  . Insertion of dialysis catheter Left 09/12/2013    Procedure: INSERTION OF DIALYSIS CATHETER;  Surgeon: Mal Misty, MD;  Location: [place];  Service: Vascular;  Laterality: Left;  . Embolectomy Right 09/17/2013    Procedure: Thrombectomy of Right Common Femoral  Artery;  Surgeon: Elam Dutch, MD;  Location: [facility] OR;  Service: Vascular;  Laterality: Right;  . Endarterectomy femoral Right 09/17/2013    Procedure: Right Femoral Endarterectomy;  Surgeon: Elam Dutch, MD;  Location: [facility] OR;  Service: Vascular;  Laterality: Right;  . Patch angioplasty Right 09/17/2013    Procedure: Vein Patch Angioplasty of Right Femoral Artery;  Surgeon: Elam Dutch, MD;  Location: [place];  Service: Vascular;  Laterality: Right;  . Fasciotomy Right 09/17/2013    Procedure: Four Compartment Fasciotomy;  Surgeon: Elam Dutch, MD;   Location: [place];  Service: Vascular;  Laterality: Right;  . Fasciotomy closure Right 09/19/2013    Procedure: FASCIOTOMY CLOSURE;  Surgeon: Elam Dutch, MD;  Location: [place];  Service: Vascular;  Laterality: Right;  regional block and monitored anesthesia care used  . I&d extremity Right 09/28/2013    Procedure: IRRIGATION AND DEBRIDEMENT EXTREMITY;  Surgeon: Serafina Mitchell, MD;  Location: [place];  Service: Vascular;  Laterality: Right;  . Application of wound vac Right 09/28/2013    Procedure: APPLICATION OF WOUND VAC;  Surgeon: Serafina Mitchell, MD;  Location: [place];  Service: Vascular;  Laterality: Right;  . Av fistula placement Right 01/03/2014    Procedure: ARTERIOVENOUS (AV) FISTULA CREATION;  Surgeon: Angelia Mould, MD;  Location: [place];  Service: Vascular;  Laterality: Right;  . Ligation of arteriovenous  fistula Right 01/04/2014    Procedure: LIGATION OF ARTERIOVENOUS  FISTULA;  Surgeon: Rosetta Posner, MD;  Location[facility] OR;  Service: Vascular;  Laterality: Right;   Family History  Problem Relation Age of Onset  . Coronary artery disease Father   . Asthma Father   . COPD Father   . Hypertension Father   . Hyperlipidemia Father   . Diabetes Father   . Congestive Heart Failure Father   . Heart disease Father     before age 59  . Heart attack Father   . Peripheral vascular disease Father   . Hypertension Mother   . Hyperlipidemia Mother   . Diabetes Mother   . Cancer Mother   . Cancer Maternal Aunt      three aunts, bone, breast, ?  . Hypertension Brother   . Diabetes Brother   . Diabetes Sister   . Colon cancer Neg Hx   . Celiac disease Neg Hx   . Crohn's disease Neg Hx   . Ulcerative colitis Neg Hx   . Lung cancer Maternal Grandmother    History  Substance Use Topics  . Smoking status: Former Smoker -- 2.00 packs/day for 30 years    Quit date: 08/01/2011  . Smokeless tobacco: Never Used  . Alcohol Use: No   OB History   Grav Para Term Preterm Abortions TAB  SAB Ect Mult Living                 Review of Systems  Gastrointestinal: Positive for abdominal pain.  All other systems reviewed and are negative.     Allergies  Crestor; Nsaids; and Ciprofloxacin  Home Medications   Prior to Admission medications   Medication Sig Start Date End Date Taking? Authorizing Provider  acetaminophen (TYLENOL) 325 MG tablet Take 2 tablets (650 mg total) by mouth every 6 (six) hours as needed for mild pain (or Fever >/= 101). 02/04/14   Delfina Redwood, MD  amoxicillin-clavulanate (AUGMENTIN) 875-125 MG per tablet Take 1 tablet by mouth 2 (two) times daily. 01/30/14   Alicia Amel  Wynonia Lawman, MD  aspirin EC 81 MG EC tablet Take 1 tablet (81 mg total) by mouth daily. 08/17/13   Belkys A Regalado, MD  atorvastatin (LIPITOR) 80 MG tablet Take 1 tablet (80 mg total) by mouth daily at 6 PM. 10/29/13   Amy D Clegg, NP  calcium acetate (PHOSLO) 667 MG capsule Take 2 capsules (1,334 mg total) by mouth 3 (three) times daily with meals. 11/10/13   Hosie Poisson, MD  cyclobenzaprine (FLEXERIL) 10 MG tablet Take 10 mg by mouth 3 (three) times daily as needed for muscle spasms.    Historical Provider, MD  Docusate Calcium (STOOL SOFTENER PO) Take 1-3 capsules by mouth daily as needed (constipation).    Historical Provider, MD  doxycycline (VIBRA-TABS) 100 MG tablet Take 1 tablet (100 mg total) by mouth 2 (two) times daily. 01/30/14   Kinnie Feil, MD  HYDROmorphone (DILAUDID) 2 MG tablet Take 1-2 tablets (2-4 mg total) by mouth every 4 (four) hours as needed for severe pain. 02/04/14   Delfina Redwood, MD  insulin aspart (NOVOLOG) 100 UNIT/ML FlexPen 5 Units, Subcutaneous, 3 times daily with meals 02/07/14   Alycia Rossetti, MD  Insulin Detemir (LEVEMIR FLEXPEN) 100 UNIT/ML Pen Inject 20 Units into the skin every evening. 10/29/13   Amy D Clegg, NP  methylcellulose (ARTIFICIAL TEARS) 1 % ophthalmic solution Place 1 drop into both eyes at bedtime as needed (dry eyes).     Historical Provider, MD  midodrine (PROAMATINE) 10 MG tablet Take 5 mg by mouth 3 (three) times a week. Monday, Wednesday and Friday    Historical Provider, MD  multivitamin (RENA-VIT) TABS tablet Take 1 tablet by mouth daily.    Historical Provider, MD  oxyCODONE-acetaminophen (PERCOCET) 5-325 MG per tablet Take 1 tablet by mouth every 4 (four) hours as needed. 02/11/14   Johnna Acosta, MD  pantoprazole (PROTONIX) 40 MG tablet Take 1 tablet (40 mg total) by mouth daily. 10/29/13   Amy D Ninfa Meeker, NP  permethrin (ELIMITE) 5 % cream Apply from neck down at night and wash off in the morning 02/05/14   Shari A Upstill, PA-C  polyethylene glycol (MIRALAX / GLYCOLAX) packet Take 17 g by mouth daily as needed (constipation). 10/29/13   Amy D Ninfa Meeker, NP  promethazine (PHENERGAN) 25 MG tablet Take 1 tablet (25 mg total) by mouth every 8 (eight) hours as needed for nausea or vomiting. 12/11/13   Susy Frizzle, MD  ursodiol (ACTIGALL) 300 MG capsule Take 1 capsule (300 mg total) by mouth 2 (two) times daily. 11/29/13   Ripudeep Krystal Eaton, MD   BP 121/69  Pulse 106  Temp(Src) 98.2 F (36.8 C) (Oral)  Resp 20  Ht 5\' 7"  (1.702 m)  Wt 262 lb (118.842 kg)  BMI 41.03 kg/m2  SpO2 100% Physical Exam  Nursing note and vitals reviewed. Constitutional: She appears well-developed and well-nourished. No distress.  HENT:  Head: Normocephalic and atraumatic.  Mouth/Throat: Oropharynx is clear and moist. No oropharyngeal exudate.  Eyes: Conjunctivae and EOM are normal. Pupils are equal, round, and reactive to light. Right eye exhibits no discharge. Left eye exhibits no discharge. No scleral icterus.  Disconjugate gaze  Neck: Normal range of motion. Neck supple. No JVD present. No thyromegaly present.  Cardiovascular: Normal rate, regular rhythm, normal heart sounds and intact distal pulses.  Exam reveals no gallop and no friction rub.   No murmur heard. No tachycardia on my exam  Pulmonary/Chest: Effort normal and  breath sounds  normal. No respiratory distress. She has no wheezes. She has no rales.  Abdominal: Soft. Bowel sounds are normal. She exhibits no distension and no mass. There is tenderness ( Tender to palpation in the left lower abdomen, small area of tenderness is focused in the left lower pannus, no erythema, no induration, no fluctuance).  Morbidly obese, well healing scar to the right lower abdomen and pelvis  Musculoskeletal: Normal range of motion. She exhibits edema (bilateral 1+ pitting edema). She exhibits no tenderness.  Lymphadenopathy:    She has no cervical adenopathy.  Neurological: She is alert. Coordination normal.  Skin: Skin is warm and dry.  Psychiatric: She has a normal mood and affect. Her behavior is normal.    ED Course  Procedures (including critical care time) Labs Review Labs Reviewed - No data to display  Imaging Review No results found.    MDM   Final diagnoses:  Left lower quadrant pain    The patient has ongoing abdominal discomfort, I have placed an ultrasound over her body wall and have evaluated her subcutaneous tissue at which location there is no sign of abscess. I have reviewed her CT scan from her prior admission, there is no signs of abscess on that scan either, there is no intra-abdominal process either. The patient reports no other symptoms other than her focalized left lower quadrant pain which again has been present for 14 days or more, there is no indication for further imaging at this time given normal bedside ultrasound, normal vital signs and focal tenderness which is unchanged. Prior workup was unremarkable, Percocet given, patient encouraged to follow up with family doctor and possible general surgery if patient continues to have pain. Her pain is not out of proportion to exam, she does not appear toxic, she has been eating and drinking without difficulty and this does not cause worsening pain  Meds given in ED:  Medications   oxyCODONE-acetaminophen (PERCOCET/ROXICET) 5-325 MG per tablet 2 tablet (not administered)    New Prescriptions   OXYCODONE-ACETAMINOPHEN (PERCOCET) 5-325 MG PER TABLET    Take 1 tablet by mouth every 4 (four) hours as needed.      Johnna Acosta, MD 02/11/14 2109

## 2014-02-11 NOTE — Discharge Instructions (Signed)
Please call your doctor for a followup appointment within 24-48 hours. When you talk to your doctor please let them know that you were seen in the emergency department and have them acquire all of your records so that they can discuss the findings with you and formulate a treatment plan to fully care for your new and ongoing problems. ° °

## 2014-02-13 ENCOUNTER — Other Ambulatory Visit (HOSPITAL_COMMUNITY): Payer: Medicare Other

## 2014-02-13 ENCOUNTER — Encounter: Payer: Medicare Other | Admitting: Vascular Surgery

## 2014-02-13 ENCOUNTER — Encounter: Payer: Self-pay | Admitting: Vascular Surgery

## 2014-02-14 ENCOUNTER — Ambulatory Visit (INDEPENDENT_AMBULATORY_CARE_PROVIDER_SITE_OTHER): Payer: Medicare Other | Admitting: Vascular Surgery

## 2014-02-14 ENCOUNTER — Encounter: Payer: Self-pay | Admitting: Vascular Surgery

## 2014-02-14 ENCOUNTER — Ambulatory Visit (INDEPENDENT_AMBULATORY_CARE_PROVIDER_SITE_OTHER): Payer: Medicare Other | Admitting: Physician Assistant

## 2014-02-14 ENCOUNTER — Encounter: Payer: Self-pay | Admitting: Physician Assistant

## 2014-02-14 ENCOUNTER — Ambulatory Visit (HOSPITAL_COMMUNITY): Payer: Medicare Other

## 2014-02-14 VITALS — BP 121/78 | HR 110 | Temp 98.0°F | Resp 18 | Ht 67.0 in | Wt 262.0 lb

## 2014-02-14 VITALS — Temp 98.0°F

## 2014-02-14 DIAGNOSIS — Z4931 Encounter for adequacy testing for hemodialysis: Secondary | ICD-10-CM | POA: Insufficient documentation

## 2014-02-14 DIAGNOSIS — R1032 Left lower quadrant pain: Secondary | ICD-10-CM

## 2014-02-14 DIAGNOSIS — N186 End stage renal disease: Secondary | ICD-10-CM

## 2014-02-14 DIAGNOSIS — I2510 Atherosclerotic heart disease of native coronary artery without angina pectoris: Secondary | ICD-10-CM

## 2014-02-14 MED ORDER — OXYCODONE-ACETAMINOPHEN 10-325 MG PO TABS: 1.0000 | ORAL_TABLET | Freq: Three times a day (TID) | ORAL | Status: DC | PRN

## 2014-02-14 NOTE — Progress Notes (Signed)
Patient name: Hannah [name] MRN: [id number] DOB: 1970-07-22 Sex: female  REASON FOR VISIT: Follow up visit  HPI: Hannah [name] is a 43 y.o. female who underwent placement of a right radiocephalic AV fistula on 37/16/9678. At the time of surgery she was noted to have a very small brachial artery. She subsequently developed significant steal and the fistula was ligated. She was set up for a follow up visit today. She tells me that they are not able to take her blood pressure in the left arm because the pressure is so low. In addition she has a very complicated vascular history and is undergone surgery in the right lower extremity by Dr. Oneida Alar and had groin wound which ultimately healed and also has open fasciotomy wounds on the right. She is morbidly obese. In addition Dr. Trula Slade was considering a carotid stent. To further complicate the situation she has an area of induration in her left lower quadrant just above her groin and this is going to be evaluated believe by the general surgeons.  With respect to her steal symptoms in the right upper extremities have essentially resolved except for some mild paresthesias in the fingertips. She dialyzes on Monday Wednesdays and Fridays. She has a functioning left IJ tunneled dialysis catheter.  REVIEW OF SYSTEMS: Valu.Nieves ] denotes positive finding; [  ] denotes negative finding  CARDIOVASCULAR:  [ ]  chest pain   [ ]  dyspnea on exertion    CONSTITUTIONAL:  [ ]  fever   [ ]  chills  PHYSICAL EXAM: Filed Vitals:   02/14/14 1350  BP: 121/78  Pulse: 110  Temp: 98 F (36.7 C)  TempSrc: Oral  Resp: 18  Height: 5\' 7"  (1.702 m)  Weight: 262 lb (118.842 kg)  SpO2: 99%   Body mass index is 41.03 kg/(m^2). GENERAL: The patient is a well-nourished female, in no acute distress. The vital signs are documented above. CARDIOVASCULAR: There is a regular rate and rhythm. PULMONARY: There is good air exchange bilaterally without wheezing or rales. Her incision in  the right arm is healed.  She has a palpable but diminished right radial pulse with no radial pulse on the left. He has open wounds in her right leg. He has an area of induration in her pannus on the left  Side just above her groin.  MEDICAL ISSUES: At this point, I would not recommend placement of a graft or fistula in the left arm given that she has no radial pulse and has known occlusive disease on this side. She had steal in the right arm is not a candidate for access in the right arm. She has open wounds on her right leg is not a candidate for a right thigh graft at this point. She has the area of induration in her abdomen on the left and I think this needs to resolve before considering new access. I will have her come back in 3-4 weeks and at that time obtain an upper extremity duplex on the left and also ABIs to help determine what options she might have for hemodialysis. At this point, the most likely site would be a left thigh graft although this would be associated with significant risk of infection given her obesity. In addition apparently Dr. Trula Slade is considering a carotid stent and she may require arteriography. If this is the case it would be useful to study her lower extremities also at that time. She tells me that Dr. Trula Slade has been her primary vascular surgeon and  therefore we will try to arrange for her to see Dr. Trula Slade after the studies are done in 3-4 weeks. Hopefully by that time the process in the left lower quadrant will have resolved.  [facility] Beeper: [phone]

## 2014-02-14 NOTE — Progress Notes (Signed)
Patient ID: Hannah [name] MRN: [id number], DOB: [birth year], 43 y.o. Date of Encounter: @DATE @  Chief Complaint:  Chief Complaint  Patient presents with  . c/o hard painful knot LLQ    2 weeks  was seen ED no findings  . Medication Refill    HPI: 43 y.o. year old white female   with extremely complicated medical history presents with the above.  I reviewed her ER note dated 02/11/14. The following is copied from that note:  HPI Comments: The patient is a 43 year old female who has a recent very complicated medical history including a surgical procedure to the right lower abdominal pelvic area for a blood clot, a fasciotomy to the right lower extremity, a myocardial infarction and who was recently admitted to the hospital for abdominal wall cellulitis. She was discharged on October 19 and presents today one week later stating that she still has pain in the left abdomen. She states that this pain has been there for 2 weeks, it was present when she was admitted to the hospital during which time a CT scan revealed no signs of subcutaneous emphysema, subcutaneous abscess or deep tissue infection. She received IV antibiotics and was discharged home. She states that the pain is present every day, it is focused in the left lower quadrant, it does not radiate and is not associated with vomiting diarrhea or fevers. She is upset because the pain is still present.  Patient is a 43 y.o. female presenting with abdominal pain. The history is provided by the patient, the spouse and medical records.    Assessment/Plan: The patient has ongoing abdominal discomfort, I have placed an ultrasound over her body wall and have evaluated her subcutaneous tissue at which location there is no sign of abscess. I have reviewed her CT scan from her prior admission, there is no signs of abscess on that scan either, there is no intra-abdominal process either. The patient reports no other symptoms other than her focalized  left lower quadrant pain which again has been present for 14 days or more, there is no indication for further imaging at this time given normal bedside ultrasound, normal vital signs and focal tenderness which is unchanged. Prior workup was unremarkable, Percocet given, patient encouraged to follow up with family doctor and possible general surgery if patient continues to have pain. Her pain is not out of proportion to exam, she does not appear toxic, she has been eating and drinking without difficulty and this does not cause worsening pain  Meds given in ED:  Medications   oxyCODONE-acetaminophen (PERCOCET/ROXICET) 5-325 MG per tablet 2 tablet (not administered)    Today patient states that the pain in her left lower abdomen is continuing to get worse. States that even when she takes the oxycodone she gets no relief.  Says that she has an appointment with Dr. Buelah Manis Monday but could not wait until then. The pain is too significant.     Past Medical History  Diagnosis Date  . High cholesterol   . Diabetic retinopathy   . Peripheral neuropathy     "tips of toes"  . Blind right eye   . CHF (congestive heart failure)   . CAD (coronary artery disease)   . GERD (gastroesophageal reflux disease)   . Hypertension   . History of lung cancer 07/2011    s/p left lower lobectomy  . Diabetes mellitus     IDDM  . Cataract of right eye   . Ulcer of toe  of right foot 07/10/2012    great toe  . Breast calcification, left 06/2012  . Acute biphenotypic leukemia   . CKD (chronic kidney disease) stage 5, GFR less than 15 ml/min   . Nephrotic syndrome   . Gastritis     H/o gastritis on prior endoscopy  . Anemia   . Carotid artery disease   . Gallstones   . Myocardial infarction 5/16     Home Meds: Outpatient Prescriptions Prior to Visit  Medication Sig Dispense Refill  . acetaminophen (TYLENOL) 325 MG tablet Take 2 tablets (650 mg total) by mouth every 6 (six) hours as needed for mild pain (or  Fever >/= 101).      Marland Kitchen aspirin EC 81 MG EC tablet Take 1 tablet (81 mg total) by mouth daily.  30 tablet  0  . atorvastatin (LIPITOR) 80 MG tablet Take 1 tablet (80 mg total) by mouth daily at 6 PM.  30 tablet  6  . calcium acetate (PHOSLO) 667 MG capsule Take 2 capsules (1,334 mg total) by mouth 3 (three) times daily with meals.  90 capsule  0  . cyclobenzaprine (FLEXERIL) 10 MG tablet Take 10 mg by mouth 3 (three) times daily as needed for muscle spasms.      Mariane Baumgarten Calcium (STOOL SOFTENER PO) Take 1-3 capsules by mouth daily as needed (constipation).      . insulin aspart (NOVOLOG) 100 UNIT/ML FlexPen 5 Units, Subcutaneous, 3 times daily with meals  15 mL  11  . Insulin Detemir (LEVEMIR FLEXPEN) 100 UNIT/ML Pen Inject 20 Units into the skin every evening.  15 mL  11  . methylcellulose (ARTIFICIAL TEARS) 1 % ophthalmic solution Place 1 drop into both eyes at bedtime as needed (dry eyes).      . midodrine (PROAMATINE) 10 MG tablet Take 5 mg by mouth 3 (three) times a week. Monday, Wednesday and Friday      . multivitamin (RENA-VIT) TABS tablet Take 1 tablet by mouth daily.      Marland Kitchen oxyCODONE-acetaminophen (PERCOCET) 5-325 MG per tablet Take 1 tablet by mouth every 4 (four) hours as needed.  20 tablet  0  . pantoprazole (PROTONIX) 40 MG tablet Take 1 tablet (40 mg total) by mouth daily.  30 tablet  6  . polyethylene glycol (MIRALAX / GLYCOLAX) packet Take 17 g by mouth daily as needed (constipation).  14 each  6  . promethazine (PHENERGAN) 25 MG tablet Take 1 tablet (25 mg total) by mouth every 8 (eight) hours as needed for nausea or vomiting.  20 tablet  0  . ursodiol (ACTIGALL) 300 MG capsule Take 1 capsule (300 mg total) by mouth 2 (two) times daily.  60 capsule  4  . HYDROmorphone (DILAUDID) 2 MG tablet Take 1-2 tablets (2-4 mg total) by mouth every 4 (four) hours as needed for severe pain.  30 tablet  0  . amoxicillin-clavulanate (AUGMENTIN) 875-125 MG per tablet Take 1 tablet by mouth 2  (two) times daily.  10 tablet  0  . doxycycline (VIBRA-TABS) 100 MG tablet Take 1 tablet (100 mg total) by mouth 2 (two) times daily.  10 tablet  0  . permethrin (ELIMITE) 5 % cream Apply from neck down at night and wash off in the morning  60 g  0   No facility-administered medications prior to visit.    Allergies:  Allergies  Allergen Reactions  . Crestor [Rosuvastatin] Other (See Comments)    Severe muscle weakness  .  Nsaids Other (See Comments)    Not allergic, "bad on my kidneys"  . Ciprofloxacin Rash    History   Social History  . Marital Status: Married    Spouse Name: N/A    Number of Children: 3  . Years of Education: N/A   Occupational History  . Not on file.   Social History Main Topics  . Smoking status: Former Smoker -- 2.00 packs/day for 30 years    Quit date: 08/01/2011  . Smokeless tobacco: Never Used  . Alcohol Use: No  . Drug Use: No  . Sexual Activity: Yes   Other Topics Concern  . Not on file   Social History Narrative  . No narrative on file    Family History  Problem Relation Age of Onset  . Coronary artery disease Father   . Asthma Father   . COPD Father   . Hypertension Father   . Hyperlipidemia Father   . Diabetes Father   . Congestive Heart Failure Father   . Heart disease Father     before age 26  . Heart attack Father   . Peripheral vascular disease Father   . Hypertension Mother   . Hyperlipidemia Mother   . Diabetes Mother   . Cancer Mother   . Cancer Maternal Aunt      three aunts, bone, breast, ?  . Hypertension Brother   . Diabetes Brother   . Diabetes Sister   . Colon cancer Neg Hx   . Celiac disease Neg Hx   . Crohn's disease Neg Hx   . Ulcerative colitis Neg Hx   . Lung cancer Maternal Grandmother      Review of Systems:  See HPI for pertinent ROS. All other ROS negative.    Physical Exam: Blood pressure , pulse , temperature 98 F (36.7 C), temperature source Oral, weight 0 lb (0 kg)., Body mass index is  0.00 kg/(m^2). General: Severely obese WF. Appears in no acute distress. Neck: Supple. No thyromegaly. No lymphadenopathy. Lungs: Clear bilaterally to auscultation without wheezes, rales, or rhonchi. Breathing is unlabored. Heart: RRR with S1 S2. No murmurs, rubs, or gallops. Abdomen: Soft, non-tender, non-distended with normoactive bowel sounds. She has  "2 levels of fat/pannus" Says that she puts insulin injections in the upper area.  Right Groin--has bandage covering wound.  Left lower abdomen: On th e"upper level of fat/pannus"--there is 1cm x 1incg area of  purple ecchymosis.  On the "lower level of fat/pannus" there is 3 inch x 2 inch area of  Diffuse light purple ecchymosis and this area is firmer than the surrounding area. And this area is severly tender even with very light palpation. Musculoskeletal:  Strength and tone normal for age. Extremities/Skin: Warm and dry.  Neuro: Alert and oriented X 3. Moves all extremities spontaneously. Gait is normal. CNII-XII grossly in tact. Psych:  Responds to questions appropriately with a normal affect.     ASSESSMENT AND PLAN:  43 y.o. year old female with  1. Abdominal pain, left lower quadrant  - CBC with Differential - COMPLETE METABOLIC PANEL WITH GFR - MR MRA ABD-PELV W/ CM; Future - oxyCODONE-acetaminophen (PERCOCET) 10-325 MG per tablet; Take 1 tablet by mouth every 8 (eight) hours as needed for pain.  Dispense: 30 tablet; Refill: 0  Last CBC was 02/04/14. White count was normal. We'll recheck CBC now. Given that she is already had a CT scan that showed no abnormality coinciding with her area of pain will obtain MRI.  Verified with patient that she does not have any hardware in her body including any type of pacemaker device in her heart and no orthopedic type implants of metal. MRI is scheduled for tomorrow morning.   [address] [place], [place], [facility] 02/14/2014 4:41 PM

## 2014-02-15 ENCOUNTER — Telehealth: Payer: Self-pay | Admitting: *Deleted

## 2014-02-15 ENCOUNTER — Other Ambulatory Visit: Payer: Self-pay | Admitting: Physician Assistant

## 2014-02-15 ENCOUNTER — Ambulatory Visit (HOSPITAL_COMMUNITY)
Admission: RE | Admit: 2014-02-15 | Discharge: 2014-02-15 | Disposition: A | Discharge status: Home / Self Care | Payer: Medicare Other | Source: Ambulatory Visit | Attending: Physician Assistant | Admitting: Physician Assistant

## 2014-02-15 DIAGNOSIS — R1032 Left lower quadrant pain: Secondary | ICD-10-CM | POA: Insufficient documentation

## 2014-02-15 DIAGNOSIS — J90 Pleural effusion, not elsewhere classified: Secondary | ICD-10-CM | POA: Diagnosis not present

## 2014-02-15 DIAGNOSIS — I2510 Atherosclerotic heart disease of native coronary artery without angina pectoris: Secondary | ICD-10-CM | POA: Diagnosis not present

## 2014-02-15 DIAGNOSIS — R911 Solitary pulmonary nodule: Secondary | ICD-10-CM | POA: Insufficient documentation

## 2014-02-15 DIAGNOSIS — Z992 Dependence on renal dialysis: Secondary | ICD-10-CM | POA: Diagnosis not present

## 2014-02-15 LAB — COMPLETE METABOLIC PANEL WITH GFR
ALT: 12 U/L (ref 0–35)
AST: 14 U/L (ref 0–37)
Albumin: 2.9 g/dL — ABNORMAL LOW (ref 3.5–5.2)
Alkaline Phosphatase: 101 U/L (ref 39–117)
BILIRUBIN TOTAL: 0.5 mg/dL (ref 0.2–1.2)
BUN: 35 mg/dL — ABNORMAL HIGH (ref 6–23)
CHLORIDE: 97 meq/L (ref 96–112)
CO2: 30 meq/L (ref 19–32)
CREATININE: 4.67 mg/dL — AB (ref 0.50–1.10)
Calcium: 9 mg/dL (ref 8.4–10.5)
GFR, EST AFRICAN AMERICAN: 12 mL/min — AB
GFR, EST NON AFRICAN AMERICAN: 11 mL/min — AB
GLUCOSE: 248 mg/dL — AB (ref 70–99)
Potassium: 4.8 mEq/L (ref 3.5–5.3)
Sodium: 141 mEq/L (ref 135–145)
Total Protein: 5.6 g/dL — ABNORMAL LOW (ref 6.0–8.3)

## 2014-02-15 LAB — CBC WITH DIFFERENTIAL/PLATELET
Basophils Absolute: 0.1 10*3/uL (ref 0.0–0.1)
Basophils Relative: 1 % (ref 0–1)
EOS ABS: 0.2 10*3/uL (ref 0.0–0.7)
EOS PCT: 3 % (ref 0–5)
HEMATOCRIT: 34.7 % — AB (ref 36.0–46.0)
HEMOGLOBIN: 10.7 g/dL — AB (ref 12.0–15.0)
LYMPHS PCT: 15 % (ref 12–46)
Lymphs Abs: 1.1 10*3/uL (ref 0.7–4.0)
MCH: 30.2 pg (ref 26.0–34.0)
MCHC: 30.8 g/dL (ref 30.0–36.0)
MCV: 98 fL (ref 78.0–100.0)
MONO ABS: 0.7 10*3/uL (ref 0.1–1.0)
MONOS PCT: 9 % (ref 3–12)
Neutro Abs: 5.3 10*3/uL (ref 1.7–7.7)
Neutrophils Relative %: 72 % (ref 43–77)
PLATELETS: 306 10*3/uL (ref 150–400)
RBC: 3.54 MIL/uL — AB (ref 3.87–5.11)
RDW: 19.1 % — ABNORMAL HIGH (ref 11.5–15.5)
WBC: 7.3 10*3/uL (ref 4.0–10.5)

## 2014-02-15 NOTE — Telephone Encounter (Signed)
Ct placed

## 2014-02-15 NOTE — Addendum Note (Signed)
Addended by: Mena Goes on: 02/15/2014 04:34 PM   Modules accepted: Orders

## 2014-02-18 ENCOUNTER — Ambulatory Visit (INDEPENDENT_AMBULATORY_CARE_PROVIDER_SITE_OTHER): Payer: Medicare Other | Admitting: Family Medicine

## 2014-02-18 ENCOUNTER — Encounter: Payer: Self-pay | Admitting: Family Medicine

## 2014-02-18 VITALS — BP 126/68 | HR 68 | Temp 98.1°F | Resp 14

## 2014-02-18 DIAGNOSIS — K219 Gastro-esophageal reflux disease without esophagitis: Secondary | ICD-10-CM

## 2014-02-18 DIAGNOSIS — I2510 Atherosclerotic heart disease of native coronary artery without angina pectoris: Secondary | ICD-10-CM

## 2014-02-18 DIAGNOSIS — M799 Soft tissue disorder, unspecified: Secondary | ICD-10-CM

## 2014-02-18 DIAGNOSIS — N186 End stage renal disease: Secondary | ICD-10-CM

## 2014-02-18 DIAGNOSIS — E1140 Type 2 diabetes mellitus with diabetic neuropathy, unspecified: Secondary | ICD-10-CM

## 2014-02-18 DIAGNOSIS — E1149 Type 2 diabetes mellitus with other diabetic neurological complication: Secondary | ICD-10-CM

## 2014-02-18 DIAGNOSIS — R918 Other nonspecific abnormal finding of lung field: Secondary | ICD-10-CM

## 2014-02-18 DIAGNOSIS — M7989 Other specified soft tissue disorders: Secondary | ICD-10-CM | POA: Insufficient documentation

## 2014-02-18 DIAGNOSIS — R21 Rash and other nonspecific skin eruption: Secondary | ICD-10-CM | POA: Insufficient documentation

## 2014-02-18 DIAGNOSIS — Z992 Dependence on renal dialysis: Secondary | ICD-10-CM

## 2014-02-18 DIAGNOSIS — Z85118 Personal history of other malignant neoplasm of bronchus and lung: Secondary | ICD-10-CM

## 2014-02-18 DIAGNOSIS — I10 Essential (primary) hypertension: Secondary | ICD-10-CM

## 2014-02-18 DIAGNOSIS — H548 Legal blindness, as defined in USA: Secondary | ICD-10-CM | POA: Insufficient documentation

## 2014-02-18 MED ORDER — TRIAMCINOLONE ACETONIDE 0.1 % EX CREA: 1.0000 "application " | TOPICAL_CREAM | Freq: Two times a day (BID) | CUTANEOUS | Status: AC

## 2014-02-18 MED ORDER — PANTOPRAZOLE SODIUM 40 MG PO TBEC: 40.0000 mg | DELAYED_RELEASE_TABLET | Freq: Every day | ORAL | Status: DC

## 2014-02-18 MED ORDER — HYDROMORPHONE HCL 2 MG PO TABS: 2.0000 mg | ORAL_TABLET | ORAL | Status: DC | PRN

## 2014-02-18 MED ORDER — PROMETHAZINE HCL 25 MG PO TABS: 25.0000 mg | ORAL_TABLET | Freq: Three times a day (TID) | ORAL | Status: AC | PRN

## 2014-02-18 NOTE — Assessment & Plan Note (Signed)
Will contact pharmacy about protonix  She has gastroparesis and does well on this medication Has tried other PPI

## 2014-02-18 NOTE — Assessment & Plan Note (Signed)
History of lung cancer, s/p VATS, CT shows signifcantly enlarged lung nodule, need repeat CT of chest without contrast

## 2014-02-18 NOTE — Assessment & Plan Note (Signed)
S/p treatment for scabies, now with multiple liner scratches, scabs over the lesion, still pruritis, apply topical TAC Avoid oral steroids with her other issues right now

## 2014-02-18 NOTE — Assessment & Plan Note (Signed)
Blood pressure well controlled

## 2014-02-18 NOTE — Assessment & Plan Note (Addendum)
Unclear cause of soft tissue mass, appears to be a cellulitis with the large indurated soft tissue mass and pain but has not responded to both IV and oral antibiotics ? Hematoma if she had lovenox injections in the area, but not improving after 3 weeks Possible necrosis of subcutaneous fat, she has problems with vascular supply in general and is on Hemodialysis Plan to obtain MRI of abdominal wall, I am not sure if we will be able to visualize enough with Ultrasound, may need general surgery for biopsy of site After discussion with radiology CT reviewed again stranding in tissue mild edema he did not think any further imaging would help, will refer her to dermatology for bipsy

## 2014-02-18 NOTE — Assessment & Plan Note (Signed)
ON hd

## 2014-02-18 NOTE — Patient Instructions (Addendum)
Talking diabetic meter Stop percocet Steroid cream twice a day  We will f/u for imaging of your abdominal wall Increase levemir to 20 units F/U 2 months

## 2014-02-18 NOTE — Progress Notes (Signed)
Patient ID: Hannah [name], female   DOB: [birth year], 43 y.o.   MRN: [id number]   Subjective:    Patient ID: Hannah [name], female    DOB: 1971-04-11, 43 y.o.   MRN: [id number]  Patient presents for Abdominal Pain Pt here with left lower quadrant pain over soft tissue mass, She has been admitted twice for probable cellulitis of the area, treated with IV antibiotics as well as oral antibiotics Augmentin and Doxy which she completed not too long ago, with no change in the area. Pain worsens if you touch the area or she bends over. Had CT scan done 10/13 concerning for post inflammatory changes vs mild cellulitis of right abdominal wall but nothing of the left side, had repeat CT done on 10/30 IMPRESSION: 1. 1.5 by 1.2 cm left lower lobe pulmonary nodule. This lesion measured 2.4 by 2.2 cm back on 06/09/2011 ; had shrunk down to 0.4 cm back on 08/02/2012 ; and has now enlarged to the current measurement noted above. Accordingly, recurrence of malignancy is a concern. There are small subpleural nodules in the right lower lobe which appear stable from 2014. 2. Progressive and somewhat generalized porta hepatis, gastrohepatic ligament, and retroperitoneal lymph nodal enlargement. An aortocaval lymph node is mildly enlarged at 1.2 cm in short axis. 3. No change in the right groin region subcutaneous stranding overlying the right common femoral vasculature. Appearance likely postinflammatory. 4. Prominent stool throughout the colon favors constipation. 5. Aortoiliac atherosclerotic vascular disease. 6. Persistent small left pleural effusion. Resolution of the prior right pleural effusion. 7. Mild cardiomegaly with coronary artery atherosclerosis.  She was given Percocet, but the increased dose made her itch all over, she does okay with dilaudid  Note also treated for probable scabies a few weeks ago, still has scabs from where she scratched so much  DM- last A1C 7.8%, taking Levemir 15  units, novolog 5 units qac, no hypoglycemia   Review Of Systems:  GEN- denies fatigue, fever, weight loss,weakness, recent illness HEENT- denies eye drainage, change in vision, nasal discharge, CVS- denies chest pain, palpitations RESP- denies SOB, cough, wheeze ABD- denies N/V, change in stools, +abd pain GU- denies dysuria, hematuria, dribbling, incontinence MSK- denies joint pain, muscle aches, injury Neuro- denies headache, dizziness, syncope, seizure activity       Objective:    BP 126/68 mmHg  Pulse 68  Temp(Src) 98.1 F (36.7 C)  Resp 14 GEN- NAD, alert and oriented x3 HEENT- PERRL, EOMI, non injected sclera, pink conjunctiva, MMM, oropharynx clear, blind right eye Neck- Supple, no LAD CVS- RRR, no murmur RESP-CTAB ABD-NABS,soft,,arge pannus, indurated lesions upper abdomen( site of insulin injections) LLE large 11x3cm area of induration and erythema, TTP, no fluctuant area, small indurated to right of umbilicus no mild erythema, previous incison- right groin d/c/i EXT- pedal edema Pulses- Radial, DP- 2+        Assessment & Plan:      Problem List Items Addressed This Visit    Type II diabetes mellitus with neurological manifestations    Uncontrolled DM Goal A1C, 7% with her comorbidites Increase Levemir to 20 units Continue Novolog 5 units with meals    Soft tissue mass - Primary    Unclear cause of soft tissue mass, appears to be a cellulitis with the large indurated soft tissue mass and pain but has not responded to both IV and oral antibiotics ? Hematoma if she had lovenox injections in the area, but not improving after 3 weeks Possible necrosis  of subcutaneous fat, she has problems with vascular supply in general and is on Hemodialysis Plan to obtain MRI of abdominal wall, I am not sure if we will be able to visualize enough with Ultrasound, may need general surgery for biopsy of site    Rash and nonspecific skin eruption    S/p treatment for scabies,  now with multiple liner scratches, scabs over the lesion, still pruritis, apply topical TAC Avoid oral steroids with her other issues right now    GERD (gastroesophageal reflux disease)    Will contact pharmacy about protonix  She has gastroparesis and does well on this medication Has tried other PPI    Relevant Medications      pantoprazole (PROTONIX) EC tablet   Essential hypertension, benign    Blood pressure well controlled    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 Any transcriptional errors that result from this process are unintentional.

## 2014-02-18 NOTE — Assessment & Plan Note (Signed)
Uncontrolled DM Goal A1C, 7% with her comorbidites Increase Levemir to 20 units Continue Novolog 5 units with meals

## 2014-02-19 ENCOUNTER — Other Ambulatory Visit (HOSPITAL_COMMUNITY): Payer: Medicare Other

## 2014-02-19 ENCOUNTER — Encounter (HOSPITAL_COMMUNITY): Payer: Medicare Other

## 2014-02-21 ENCOUNTER — Encounter (HOSPITAL_COMMUNITY): Payer: Medicare Other

## 2014-02-21 ENCOUNTER — Other Ambulatory Visit (HOSPITAL_COMMUNITY): Payer: Medicare Other

## 2014-02-22 ENCOUNTER — Ambulatory Visit (HOSPITAL_COMMUNITY): Admission: RE | Admit: 2014-02-22 | Payer: Medicare Other | Source: Ambulatory Visit

## 2014-02-23 ENCOUNTER — Inpatient Hospital Stay (HOSPITAL_COMMUNITY)
Admission: EM | Admit: 2014-02-23 | Discharge: 2014-02-26 | DRG: 871 | Disposition: A | Discharge status: Home Health | Payer: Medicare Other | Attending: Family Medicine | Admitting: Family Medicine

## 2014-02-23 DIAGNOSIS — Z7982 Long term (current) use of aspirin: Secondary | ICD-10-CM

## 2014-02-23 DIAGNOSIS — D638 Anemia in other chronic diseases classified elsewhere: Secondary | ICD-10-CM | POA: Diagnosis present

## 2014-02-23 DIAGNOSIS — N186 End stage renal disease: Secondary | ICD-10-CM

## 2014-02-23 DIAGNOSIS — Z79899 Other long term (current) drug therapy: Secondary | ICD-10-CM

## 2014-02-23 DIAGNOSIS — Z85118 Personal history of other malignant neoplasm of bronchus and lung: Secondary | ICD-10-CM

## 2014-02-23 DIAGNOSIS — L0291 Cutaneous abscess, unspecified: Secondary | ICD-10-CM | POA: Diagnosis not present

## 2014-02-23 DIAGNOSIS — I252 Old myocardial infarction: Secondary | ICD-10-CM

## 2014-02-23 DIAGNOSIS — I2510 Atherosclerotic heart disease of native coronary artery without angina pectoris: Secondary | ICD-10-CM | POA: Diagnosis present

## 2014-02-23 DIAGNOSIS — Z794 Long term (current) use of insulin: Secondary | ICD-10-CM

## 2014-02-23 DIAGNOSIS — L03311 Cellulitis of abdominal wall: Secondary | ICD-10-CM | POA: Diagnosis present

## 2014-02-23 DIAGNOSIS — Z87891 Personal history of nicotine dependence: Secondary | ICD-10-CM

## 2014-02-23 DIAGNOSIS — D631 Anemia in chronic kidney disease: Secondary | ICD-10-CM | POA: Diagnosis present

## 2014-02-23 DIAGNOSIS — E11319 Type 2 diabetes mellitus with unspecified diabetic retinopathy without macular edema: Secondary | ICD-10-CM | POA: Diagnosis present

## 2014-02-23 DIAGNOSIS — I5042 Chronic combined systolic (congestive) and diastolic (congestive) heart failure: Secondary | ICD-10-CM | POA: Diagnosis present

## 2014-02-23 DIAGNOSIS — E780 Pure hypercholesterolemia: Secondary | ICD-10-CM | POA: Diagnosis present

## 2014-02-23 DIAGNOSIS — I679 Cerebrovascular disease, unspecified: Secondary | ICD-10-CM | POA: Diagnosis present

## 2014-02-23 DIAGNOSIS — I255 Ischemic cardiomyopathy: Secondary | ICD-10-CM | POA: Diagnosis present

## 2014-02-23 DIAGNOSIS — N390 Urinary tract infection, site not specified: Secondary | ICD-10-CM | POA: Diagnosis present

## 2014-02-23 DIAGNOSIS — L0390 Cellulitis, unspecified: Secondary | ICD-10-CM

## 2014-02-23 DIAGNOSIS — Z79891 Long term (current) use of opiate analgesic: Secondary | ICD-10-CM

## 2014-02-23 DIAGNOSIS — Z992 Dependence on renal dialysis: Secondary | ICD-10-CM

## 2014-02-23 DIAGNOSIS — I120 Hypertensive chronic kidney disease with stage 5 chronic kidney disease or end stage renal disease: Secondary | ICD-10-CM | POA: Diagnosis present

## 2014-02-23 DIAGNOSIS — A419 Sepsis, unspecified organism: Secondary | ICD-10-CM | POA: Diagnosis not present

## 2014-02-23 DIAGNOSIS — I739 Peripheral vascular disease, unspecified: Secondary | ICD-10-CM | POA: Diagnosis present

## 2014-02-23 DIAGNOSIS — N2581 Secondary hyperparathyroidism of renal origin: Secondary | ICD-10-CM | POA: Diagnosis present

## 2014-02-23 DIAGNOSIS — E1159 Type 2 diabetes mellitus with other circulatory complications: Secondary | ICD-10-CM | POA: Diagnosis present

## 2014-02-23 DIAGNOSIS — K219 Gastro-esophageal reflux disease without esophagitis: Secondary | ICD-10-CM | POA: Diagnosis present

## 2014-02-23 DIAGNOSIS — H5441 Blindness, right eye, normal vision left eye: Secondary | ICD-10-CM | POA: Diagnosis present

## 2014-02-23 DIAGNOSIS — I509 Heart failure, unspecified: Secondary | ICD-10-CM | POA: Diagnosis present

## 2014-02-23 DIAGNOSIS — Z6841 Body Mass Index (BMI) 40.0 and over, adult: Secondary | ICD-10-CM

## 2014-02-23 LAB — COMPREHENSIVE METABOLIC PANEL
ALBUMIN: 2.3 g/dL — AB (ref 3.5–5.2)
ALT: 23 U/L (ref 0–35)
AST: 17 U/L (ref 0–37)
Alkaline Phosphatase: 175 U/L — ABNORMAL HIGH (ref 39–117)
Anion gap: 15 (ref 5–15)
BUN: 34 mg/dL — ABNORMAL HIGH (ref 6–23)
CO2: 27 mEq/L (ref 19–32)
Calcium: 9.2 mg/dL (ref 8.4–10.5)
Chloride: 96 mEq/L (ref 96–112)
Creatinine, Ser: 4.66 mg/dL — ABNORMAL HIGH (ref 0.50–1.10)
GFR calc Af Amer: 12 mL/min — ABNORMAL LOW (ref >90)
GFR calc non Af Amer: 10 mL/min — ABNORMAL LOW (ref >90)
Glucose, Bld: 343 mg/dL — ABNORMAL HIGH (ref 70–99)
POTASSIUM: 5.2 meq/L (ref 3.7–5.3)
SODIUM: 138 meq/L (ref 137–147)
TOTAL PROTEIN: 6.3 g/dL (ref 6.0–8.3)
Total Bilirubin: 0.6 mg/dL (ref 0.3–1.2)

## 2014-02-23 LAB — CBC WITH DIFFERENTIAL/PLATELET
BASOS ABS: 0.1 10*3/uL (ref 0.0–0.1)
BASOS PCT: 1 % (ref 0–1)
EOS ABS: 0.2 10*3/uL (ref 0.0–0.7)
Eosinophils Relative: 3 % (ref 0–5)
HCT: 38.3 % (ref 36.0–46.0)
Hemoglobin: 11.6 g/dL — ABNORMAL LOW (ref 12.0–15.0)
LYMPHS ABS: 0.9 10*3/uL (ref 0.7–4.0)
Lymphocytes Relative: 10 % — ABNORMAL LOW (ref 12–46)
MCH: 29.9 pg (ref 26.0–34.0)
MCHC: 30.3 g/dL (ref 30.0–36.0)
MCV: 98.7 fL (ref 78.0–100.0)
Monocytes Absolute: 0.6 10*3/uL (ref 0.1–1.0)
Monocytes Relative: 7 % (ref 3–12)
NEUTROS PCT: 79 % — AB (ref 43–77)
Neutro Abs: 7.1 10*3/uL (ref 1.7–7.7)
PLATELETS: 321 10*3/uL (ref 150–400)
RBC: 3.88 MIL/uL (ref 3.87–5.11)
RDW: 18.5 % — ABNORMAL HIGH (ref 11.5–15.5)
WBC: 8.8 10*3/uL (ref 4.0–10.5)

## 2014-02-23 LAB — URINALYSIS, ROUTINE W REFLEX MICROSCOPIC
Bilirubin Urine: NEGATIVE
Glucose, UA: 500 mg/dL — AB
Ketones, ur: 15 mg/dL — AB
NITRITE: NEGATIVE
PH: 6 (ref 5.0–8.0)
Protein, ur: >300 mg/dL — AB
SPECIFIC GRAVITY, URINE: 1.024 (ref 1.005–1.030)
Urobilinogen, UA: 0.2 mg/dL (ref 0.0–1.0)

## 2014-02-23 LAB — URINE MICROSCOPIC-ADD ON

## 2014-02-23 LAB — PREGNANCY, URINE: PREG TEST UR: NEGATIVE

## 2014-02-23 MED ORDER — VANCOMYCIN HCL IN DEXTROSE 1-5 GM/200ML-% IV SOLN
1000.0000 mg | Freq: Once | INTRAVENOUS | Status: AC
  Administered 2014-02-24: 1000 mg via INTRAVENOUS
  Filled 2014-02-23: qty 200

## 2014-02-23 MED ORDER — PIPERACILLIN-TAZOBACTAM 3.375 G IVPB
3.3750 g | Freq: Once | INTRAVENOUS | Status: DC
  Filled 2014-02-23: qty 50

## 2014-02-23 MED ORDER — HYDROMORPHONE HCL 1 MG/ML IJ SOLN
2.0000 mg | Freq: Once | INTRAMUSCULAR | Status: AC
  Administered 2014-02-23: 2 mg via INTRAVENOUS
  Filled 2014-02-23: qty 2

## 2014-02-23 NOTE — ED Notes (Signed)
The pt is c/o abd soreness  And bruising for one month.  She has had lovenox shots  In her abd since then and the pain hAS INCREASED.  lmp june

## 2014-02-23 NOTE — ED Provider Notes (Signed)
CSN: [id number]     Arrival date & time 02/23/14  2147 History   First MD Initiated Contact with Patient 02/23/14 2237     Chief Complaint  Patient presents with  . Abdominal Pain     (Consider location/radiation/quality/duration/timing/severity/associated sxs/prior Treatment) HPI Hannah [name] is a 43 y.o. female with multiple medical problems including end-stage renal disease on dialysis, diabetes, recent surgical procedure to remove a blood clot from the lower right abdominal area and admission for abdominal wall cellulitis. She presents today for further evaluation of the pain she is experiencing from her recent abdominal wall cellulitis. She reports the pain is not improving and is getting worse. The pain does not radiate anywhere else. Movement exacerbates the pain, but nothing relieves it. She also reports the cellulitic area has grown in size to "at least double or triple" its original size when she was discharged on October 19. She reports seeing her primary care provider last week and is scheduled for a skin biopsy on November 18 to determine the cause of the cellulitis. She denies any fevers, chills, vomiting or diarrhea.  Past Medical History  Diagnosis Date  . High cholesterol   . Diabetic retinopathy   . Peripheral neuropathy     "tips of toes"  . Blind right eye   . CHF (congestive heart failure)   . CAD (coronary artery disease)   . GERD (gastroesophageal reflux disease)   . Hypertension   . History of lung cancer 07/2011    s/p left lower lobectomy  . Diabetes mellitus     IDDM  . Cataract of right eye   . Ulcer of toe of right foot 07/10/2012    great toe  . Breast calcification, left 06/2012  . Acute biphenotypic leukemia   . CKD (chronic kidney disease) stage 5, GFR less than 15 ml/min   . Nephrotic syndrome   . Gastritis     H/o gastritis on prior endoscopy  . Anemia   . Carotid artery disease   . Gallstones   . Myocardial infarction 5/16   Past Surgical  History  Procedure Laterality Date  . Cardiac catheterization  07/16/2011  . Incision and drainage breast abscess Left   . Tubal ligation  1994  . Vitrectomy  2010    2 on left, 1 on right  . Cesarean section  1991; 1994  . Video assisted thoracoscopy (vats)/ lobectomy Left 07/30/2011    left main thoracotomy, left lower lobectomy, mediastinal lymph node dissection  . Lobectomy    . Colonoscopy with esophagogastroduodenoscopy (egd) N/A 08/14/2012    Procedure: COLONOSCOPY WITH ESOPHAGOGASTRODUODENOSCOPY (EGD);  Surgeon: Danie Binder, MD;  [facility];  Service: Endoscopy;  Laterality: N/A;  10:45-moved to 1110 [address] to notify pt  . Breast lumpectomy with needle localization Left 11/14/2012    Procedure: BREAST LUMPECTOMY WITH NEEDLE LOCALIZATION;  Surgeon: Marcello Moores A. Cornett, MD;  Location: [place];  Service: General;  Laterality: Left;  . Insertion of dialysis catheter Left 09/12/2013    Procedure: INSERTION OF DIALYSIS CATHETER;  Surgeon: Mal Misty, MD;  Location: [place];  Service: Vascular;  Laterality: Left;  . Embolectomy Right 09/17/2013    Procedure: Thrombectomy of Right Common Femoral Artery;  Surgeon: Elam Dutch, MD;  Location: [facility] OR;  Service: Vascular;  Laterality: Right;  . Endarterectomy femoral Right 09/17/2013    Procedure: Right Femoral Endarterectomy;  Surgeon: Elam Dutch, MD;  Location: [place];  Service: Vascular;  Laterality: Right;  . Patch angioplasty Right 09/17/2013    Procedure: Vein Patch Angioplasty of Right Femoral Artery;  Surgeon: Elam Dutch, MD;  Location: [facility] OR;  Service: Vascular;  Laterality: Right;  . Fasciotomy Right 09/17/2013    Procedure: Four Compartment Fasciotomy;  Surgeon: Elam Dutch, MD;  Location: [place];  Service: Vascular;  Laterality: Right;  . Fasciotomy closure Right 09/19/2013    Procedure: FASCIOTOMY CLOSURE;  Surgeon: Elam Dutch, MD;  Location: [place];  Service: Vascular;  Laterality: Right;  regional block  and monitored anesthesia care used  . I&d extremity Right 09/28/2013    Procedure: IRRIGATION AND DEBRIDEMENT EXTREMITY;  Surgeon: Serafina Mitchell, MD;  Location: [place];  Service: Vascular;  Laterality: Right;  . Application of wound vac Right 09/28/2013    Procedure: APPLICATION OF WOUND VAC;  Surgeon: Serafina Mitchell, MD;  Location: [place];  Service: Vascular;  Laterality: Right;  . Av fistula placement Right 01/03/2014    Procedure: ARTERIOVENOUS (AV) FISTULA CREATION;  Surgeon: Angelia Mould, MD;  Location: [place];  Service: Vascular;  Laterality: Right;  . Ligation of arteriovenous  fistula Right 01/04/2014    Procedure: LIGATION OF ARTERIOVENOUS  FISTULA;  Surgeon: Rosetta Posner, MD;  Location: [facility] OR;  Service: Vascular;  Laterality: Right;   Family History  Problem Relation Age of Onset  . Coronary artery disease Father   . Asthma Father   . COPD Father   . Hypertension Father   . Hyperlipidemia Father   . Diabetes Father   . Congestive Heart Failure Father   . Heart disease Father     before age 59  . Heart attack Father   . Peripheral vascular disease Father   . Hypertension Mother   . Hyperlipidemia Mother   . Diabetes Mother   . Cancer Mother   . Cancer Maternal Aunt      three aunts, bone, breast, ?  . Hypertension Brother   . Diabetes Brother   . Diabetes Sister   . Colon cancer Neg Hx   . Celiac disease Neg Hx   . Crohn's disease Neg Hx   . Ulcerative colitis Neg Hx   . Lung cancer Maternal Grandmother    History  Substance Use Topics  . Smoking status: Former Smoker -- 2.00 packs/day for 30 years    Quit date: 08/01/2011  . Smokeless tobacco: Never Used  . Alcohol Use: No   OB History    No data available     Review of Systems  Constitutional: Negative for fever.  HENT: Negative for sore throat.   Eyes: Negative for visual disturbance.  Respiratory: Negative for shortness of breath.   Cardiovascular: Negative for chest pain.  Gastrointestinal:  Positive for nausea and abdominal pain.  Endocrine: Negative for polyuria.  Genitourinary: Negative for dysuria.  Skin: Positive for rash and wound.  Allergic/Immunologic: Positive for immunocompromised state.  Neurological: Negative for headaches.      Allergies  Crestor; Nsaids; and Ciprofloxacin  Home Medications   Prior to Admission medications   Medication Sig Start Date End Date Taking? Authorizing Provider  aspirin EC 81 MG EC tablet Take 1 tablet (81 mg total) by mouth daily. 08/17/13   Belkys A Regalado, MD  atorvastatin (LIPITOR) 80 MG tablet Take 1 tablet (80 mg total) by mouth daily at 6 PM. 10/29/13   Amy D Clegg, NP  calcium acetate (PHOSLO) 667 MG capsule Take 2 capsules (1,334 mg total)  by mouth 3 (three) times daily with meals. 11/10/13   Hosie Poisson, MD  cyclobenzaprine (FLEXERIL) 10 MG tablet Take 10 mg by mouth 3 (three) times daily as needed for muscle spasms.    Historical Provider, MD  Docusate Calcium (STOOL SOFTENER PO) Take 1-3 capsules by mouth daily as needed (constipation).    Historical Provider, MD  HYDROmorphone (DILAUDID) 2 MG tablet Take 1-2 tablets (2-4 mg total) by mouth every 4 (four) hours as needed for severe pain. 02/18/14   Alycia Rossetti, MD  insulin aspart (NOVOLOG) 100 UNIT/ML FlexPen 5 Units, Subcutaneous, 3 times daily with meals 02/07/14   Alycia Rossetti, MD  Insulin Detemir (LEVEMIR FLEXPEN) 100 UNIT/ML Pen Inject 20 Units into the skin every evening. 10/29/13   Amy D Clegg, NP  methylcellulose (ARTIFICIAL TEARS) 1 % ophthalmic solution Place 1 drop into both eyes at bedtime as needed (dry eyes).    Historical Provider, MD  midodrine (PROAMATINE) 10 MG tablet Take 5 mg by mouth 3 (three) times a week. Monday, Wednesday and Friday    Historical Provider, MD  multivitamin (RENA-VIT) TABS tablet Take 1 tablet by mouth daily.    Historical Provider, MD  pantoprazole (PROTONIX) 40 MG tablet Take 1 tablet (40 mg total) by mouth daily. 02/18/14    Alycia Rossetti, MD  polyethylene glycol [facility] / Floria Raveling) packet Take 17 g by mouth daily as needed (constipation). 10/29/13   Amy D Ninfa Meeker, NP  promethazine (PHENERGAN) 25 MG tablet Take 1 tablet (25 mg total) by mouth every 8 (eight) hours as needed for nausea or vomiting. 02/18/14   Alycia Rossetti, MD  triamcinolone cream (KENALOG) 0.1 % Apply 1 application topically 2 (two) times daily. 02/18/14   Alycia Rossetti, MD  ursodiol (ACTIGALL) 300 MG capsule Take 1 capsule (300 mg total) by mouth 2 (two) times daily. 11/29/13   Ripudeep K Rai, MD   BP 113/65 mmHg  Pulse 124  Temp(Src) 98.1 F (36.7 C) (Oral)  Resp 13  Ht 5\' 7"  (1.702 m)  Wt 257 lb (116.574 kg)  BMI 40.24 kg/m2  SpO2 94% Physical Exam  Constitutional: She is oriented to person, place, and time. She appears well-developed and well-nourished.  Morbidly obese.   HENT:  Head: Normocephalic and atraumatic.  Mouth/Throat: Oropharynx is clear and moist.  Eyes: Conjunctivae are normal. Pupils are equal, round, and reactive to light. Right eye exhibits no discharge. Left eye exhibits no discharge. No scleral icterus.  Neck: Neck supple.  Cardiovascular: Normal rate, regular rhythm and normal heart sounds.   Pulmonary/Chest: Effort normal and breath sounds normal. No respiratory distress. She has no wheezes. She has no rales.  Abdominal: Soft. There is no tenderness.  Area of cellulitis along the lower left pannus approx 7-8 in length and 2 inches in width with surrounding erythema and induration. Exquisitely tender to light palpation  Musculoskeletal: She exhibits no tenderness.  2+ Pitting edema bilaterally  Neurological: She is alert and oriented to person, place, and time.  Cranial Nerves II-XII grossly intact  Skin: Skin is warm and dry.  Psychiatric: She has a normal mood and affect.  Nursing note and vitals reviewed.   ED Course  Procedures (including critical care time) Labs Review Labs Reviewed  CBC WITH  DIFFERENTIAL - Abnormal; Notable for the following:    Hemoglobin 11.6 (*)    RDW 18.5 (*)    Neutrophils Relative % 79 (*)    Lymphocytes Relative 10 (*)  All other components within normal limits  COMPREHENSIVE METABOLIC PANEL - Abnormal; Notable for the following:    Glucose, Bld 343 (*)    BUN 34 (*)    Creatinine, Ser 4.66 (*)    Albumin 2.3 (*)    Alkaline Phosphatase 175 (*)    GFR calc non Af Amer 10 (*)    GFR calc Af Amer 12 (*)    All other components within normal limits  URINALYSIS, ROUTINE W REFLEX MICROSCOPIC - Abnormal; Notable for the following:    APPearance TURBID (*)    Glucose, UA 500 (*)    Hgb urine dipstick LARGE (*)    Ketones, ur 15 (*)    Protein, ur >300 (*)    Leukocytes, UA LARGE (*)    All other components within normal limits  URINE MICROSCOPIC-ADD ON - Abnormal; Notable for the following:    Squamous Epithelial / LPF FEW (*)    Bacteria, UA MANY (*)    All other components within normal limits  PREGNANCY, URINE    Imaging Review No results found.   EKG Interpretation   Date/Time:  Saturday February 23 2014 23:59:47 EST Ventricular Rate:  119 PR Interval:  162 QRS Duration: 84 QT Interval:  328 QTC Calculation: 461 R Axis:   62 Text Interpretation:  Sinus tachycardia Low voltage, precordial leads  Probable anteroseptal infarct, old Borderline repolarization abnormality  worsening inferolateral ST depression Confirmed by Wyvonnia Dusky  MD, [place]  ([id number]) on 02/24/2014 12:41:21 AM      MDM  Patient presents for evaluation of left abdominal pain. Patient presentation on arrival is tachycardic 120s, febrile with rectal temperature 100.7.  250 mL fluid bolus given.  Ultrasound performed at bedside by Dr. Wyvonnia Dusky showed no appreciable fluid or drainable abscess At this time patient meets SIRS criteria. Blood cultures obtained (X2), Vanc and Zosyn started in ED.  PE--shows lower left pannus has area of erythema and induration that has  reportedly increased in size. Pt reports pain is worse over the past few days than it has been previously. Labwork shows evidence of UTI, otherwise noncontributory  There is some concern for a necrotizing pathology given pt is vasculopath and associated pmh. Discussed with hospitalist and will admit   Discussed with pt need for admission for better evaluation of abdominal lesion, pt very amenable to plan.  Prior to patient admission, I discussed and reviewed this case with Dr.Rancour who also saw and evaluated the pt.    Final diagnoses:  Abscess  Abdominal wall cellulitis        Verl Dicker, PA-C 02/24/14 [place], MD 02/24/14 1422

## 2014-02-24 ENCOUNTER — Encounter (HOSPITAL_COMMUNITY): Payer: Self-pay | Admitting: General Practice

## 2014-02-24 ENCOUNTER — Inpatient Hospital Stay (HOSPITAL_COMMUNITY): Payer: Medicare Other

## 2014-02-24 DIAGNOSIS — I2510 Atherosclerotic heart disease of native coronary artery without angina pectoris: Secondary | ICD-10-CM | POA: Diagnosis present

## 2014-02-24 DIAGNOSIS — E11349 Type 2 diabetes mellitus with severe nonproliferative diabetic retinopathy without macular edema: Secondary | ICD-10-CM

## 2014-02-24 DIAGNOSIS — I255 Ischemic cardiomyopathy: Secondary | ICD-10-CM | POA: Diagnosis present

## 2014-02-24 DIAGNOSIS — K219 Gastro-esophageal reflux disease without esophagitis: Secondary | ICD-10-CM

## 2014-02-24 DIAGNOSIS — D631 Anemia in chronic kidney disease: Secondary | ICD-10-CM | POA: Diagnosis present

## 2014-02-24 DIAGNOSIS — N390 Urinary tract infection, site not specified: Secondary | ICD-10-CM | POA: Diagnosis present

## 2014-02-24 DIAGNOSIS — L03311 Cellulitis of abdominal wall: Secondary | ICD-10-CM | POA: Diagnosis present

## 2014-02-24 DIAGNOSIS — I120 Hypertensive chronic kidney disease with stage 5 chronic kidney disease or end stage renal disease: Secondary | ICD-10-CM | POA: Diagnosis present

## 2014-02-24 DIAGNOSIS — H5441 Blindness, right eye, normal vision left eye: Secondary | ICD-10-CM | POA: Diagnosis present

## 2014-02-24 DIAGNOSIS — Z794 Long term (current) use of insulin: Secondary | ICD-10-CM | POA: Diagnosis not present

## 2014-02-24 DIAGNOSIS — E11319 Type 2 diabetes mellitus with unspecified diabetic retinopathy without macular edema: Secondary | ICD-10-CM | POA: Diagnosis present

## 2014-02-24 DIAGNOSIS — L0291 Cutaneous abscess, unspecified: Secondary | ICD-10-CM | POA: Diagnosis present

## 2014-02-24 DIAGNOSIS — E1159 Type 2 diabetes mellitus with other circulatory complications: Secondary | ICD-10-CM | POA: Diagnosis present

## 2014-02-24 DIAGNOSIS — A419 Sepsis, unspecified organism: Secondary | ICD-10-CM | POA: Diagnosis present

## 2014-02-24 DIAGNOSIS — I739 Peripheral vascular disease, unspecified: Secondary | ICD-10-CM | POA: Diagnosis present

## 2014-02-24 DIAGNOSIS — D638 Anemia in other chronic diseases classified elsewhere: Secondary | ICD-10-CM

## 2014-02-24 DIAGNOSIS — Z7982 Long term (current) use of aspirin: Secondary | ICD-10-CM | POA: Diagnosis not present

## 2014-02-24 DIAGNOSIS — Z6841 Body Mass Index (BMI) 40.0 and over, adult: Secondary | ICD-10-CM | POA: Diagnosis not present

## 2014-02-24 DIAGNOSIS — N186 End stage renal disease: Secondary | ICD-10-CM | POA: Diagnosis present

## 2014-02-24 DIAGNOSIS — Z992 Dependence on renal dialysis: Secondary | ICD-10-CM | POA: Diagnosis not present

## 2014-02-24 DIAGNOSIS — Z87891 Personal history of nicotine dependence: Secondary | ICD-10-CM | POA: Diagnosis not present

## 2014-02-24 DIAGNOSIS — Z85118 Personal history of other malignant neoplasm of bronchus and lung: Secondary | ICD-10-CM | POA: Diagnosis not present

## 2014-02-24 DIAGNOSIS — Z79899 Other long term (current) drug therapy: Secondary | ICD-10-CM | POA: Diagnosis not present

## 2014-02-24 DIAGNOSIS — I252 Old myocardial infarction: Secondary | ICD-10-CM | POA: Diagnosis not present

## 2014-02-24 DIAGNOSIS — N2581 Secondary hyperparathyroidism of renal origin: Secondary | ICD-10-CM | POA: Diagnosis present

## 2014-02-24 DIAGNOSIS — I679 Cerebrovascular disease, unspecified: Secondary | ICD-10-CM | POA: Diagnosis present

## 2014-02-24 DIAGNOSIS — I5042 Chronic combined systolic (congestive) and diastolic (congestive) heart failure: Secondary | ICD-10-CM | POA: Diagnosis present

## 2014-02-24 DIAGNOSIS — E780 Pure hypercholesterolemia: Secondary | ICD-10-CM | POA: Diagnosis present

## 2014-02-24 DIAGNOSIS — Z79891 Long term (current) use of opiate analgesic: Secondary | ICD-10-CM | POA: Diagnosis not present

## 2014-02-24 DIAGNOSIS — I509 Heart failure, unspecified: Secondary | ICD-10-CM | POA: Diagnosis present

## 2014-02-24 LAB — COMPREHENSIVE METABOLIC PANEL
ALT: 22 U/L (ref 0–35)
AST: 21 U/L (ref 0–37)
Albumin: 2.4 g/dL — ABNORMAL LOW (ref 3.5–5.2)
Alkaline Phosphatase: 162 U/L — ABNORMAL HIGH (ref 39–117)
Anion gap: 17 — ABNORMAL HIGH (ref 5–15)
BUN: 38 mg/dL — ABNORMAL HIGH (ref 6–23)
CALCIUM: 9.4 mg/dL (ref 8.4–10.5)
CO2: 23 mEq/L (ref 19–32)
CREATININE: 4.96 mg/dL — AB (ref 0.50–1.10)
Chloride: 97 mEq/L (ref 96–112)
GFR, EST AFRICAN AMERICAN: 11 mL/min — AB (ref >90)
GFR, EST NON AFRICAN AMERICAN: 10 mL/min — AB (ref >90)
GLUCOSE: 158 mg/dL — AB (ref 70–99)
Potassium: 4.3 mEq/L (ref 3.7–5.3)
SODIUM: 137 meq/L (ref 137–147)
TOTAL PROTEIN: 6.5 g/dL (ref 6.0–8.3)
Total Bilirubin: 0.6 mg/dL (ref 0.3–1.2)

## 2014-02-24 LAB — CBC
HCT: 38.2 % (ref 36.0–46.0)
HEMOGLOBIN: 11.3 g/dL — AB (ref 12.0–15.0)
MCH: 29 pg (ref 26.0–34.0)
MCHC: 29.6 g/dL — AB (ref 30.0–36.0)
MCV: 98.2 fL (ref 78.0–100.0)
Platelets: 313 10*3/uL (ref 150–400)
RBC: 3.89 MIL/uL (ref 3.87–5.11)
RDW: 18.6 % — ABNORMAL HIGH (ref 11.5–15.5)
WBC: 8.1 10*3/uL (ref 4.0–10.5)

## 2014-02-24 LAB — GLUCOSE, CAPILLARY
GLUCOSE-CAPILLARY: 236 mg/dL — AB (ref 70–99)
GLUCOSE-CAPILLARY: 174 mg/dL — AB (ref 70–99)
GLUCOSE-CAPILLARY: 220 mg/dL — AB (ref 70–99)
Glucose-Capillary: 161 mg/dL — ABNORMAL HIGH (ref 70–99)
Glucose-Capillary: 198 mg/dL — ABNORMAL HIGH (ref 70–99)

## 2014-02-24 LAB — PROTIME-INR
INR: 1.15 (ref 0.00–1.49)
Prothrombin Time: 14.8 seconds (ref 11.6–15.2)

## 2014-02-24 LAB — I-STAT CG4 LACTIC ACID, ED: LACTIC ACID, VENOUS: 1.25 mmol/L (ref 0.5–2.2)

## 2014-02-24 LAB — TROPONIN I

## 2014-02-24 MED ORDER — SEVELAMER CARBONATE 800 MG PO TABS
1600.0000 mg | ORAL_TABLET | Freq: Three times a day (TID) | ORAL | Status: DC
  Administered 2014-02-25 – 2014-02-26 (×4): 1600 mg via ORAL
  Filled 2014-02-24 (×7): qty 2

## 2014-02-24 MED ORDER — CALCIUM ACETATE 667 MG PO CAPS
1334.0000 mg | ORAL_CAPSULE | Freq: Three times a day (TID) | ORAL | Status: DC
  Administered 2014-02-24 (×2): 1334 mg via ORAL
  Filled 2014-02-24 (×4): qty 2

## 2014-02-24 MED ORDER — ONDANSETRON HCL 4 MG/2ML IJ SOLN: 4.0000 mg | Freq: Four times a day (QID) | INTRAMUSCULAR | Status: DC | PRN

## 2014-02-24 MED ORDER — PANTOPRAZOLE SODIUM 40 MG PO TBEC
40.0000 mg | DELAYED_RELEASE_TABLET | Freq: Every day | ORAL | Status: DC
  Administered 2014-02-24 – 2014-02-26 (×3): 40 mg via ORAL
  Filled 2014-02-24 (×3): qty 1

## 2014-02-24 MED ORDER — URSODIOL 300 MG PO CAPS
300.0000 mg | ORAL_CAPSULE | Freq: Two times a day (BID) | ORAL | Status: DC
  Administered 2014-02-24 – 2014-02-26 (×6): 300 mg via ORAL
  Filled 2014-02-24 (×7): qty 1

## 2014-02-24 MED ORDER — PIPERACILLIN-TAZOBACTAM IN DEX 2-0.25 GM/50ML IV SOLN
2.2500 g | Freq: Three times a day (TID) | INTRAVENOUS | Status: DC
  Administered 2014-02-24 – 2014-02-26 (×6): 2.25 g via INTRAVENOUS
  Filled 2014-02-24 (×10): qty 50

## 2014-02-24 MED ORDER — CYCLOBENZAPRINE HCL 10 MG PO TABS
10.0000 mg | ORAL_TABLET | Freq: Three times a day (TID) | ORAL | Status: DC | PRN
  Administered 2014-02-24 – 2014-02-25 (×3): 10 mg via ORAL
  Filled 2014-02-24 (×3): qty 1

## 2014-02-24 MED ORDER — SODIUM CHLORIDE 0.9 % IJ SOLN
3.0000 mL | Freq: Two times a day (BID) | INTRAMUSCULAR | Status: DC
  Administered 2014-02-24 – 2014-02-26 (×6): 3 mL via INTRAVENOUS

## 2014-02-24 MED ORDER — VANCOMYCIN HCL IN DEXTROSE 1-5 GM/200ML-% IV SOLN
1000.0000 mg | INTRAVENOUS | Status: DC
  Administered 2014-02-25: 1000 mg via INTRAVENOUS
  Filled 2014-02-24 (×2): qty 200

## 2014-02-24 MED ORDER — SODIUM CHLORIDE 0.9 % IV BOLUS (SEPSIS)
250.0000 mL | Freq: Once | INTRAVENOUS | Status: AC
  Administered 2014-02-24: 250 mL via INTRAVENOUS

## 2014-02-24 MED ORDER — HYDROMORPHONE HCL 2 MG PO TABS
2.0000 mg | ORAL_TABLET | ORAL | Status: DC | PRN
  Administered 2014-02-24 (×3): 4 mg via ORAL
  Administered 2014-02-24: 2 mg via ORAL
  Administered 2014-02-25 – 2014-02-26 (×6): 4 mg via ORAL
  Administered 2014-02-26: 2 mg via ORAL
  Filled 2014-02-24 (×3): qty 2
  Filled 2014-02-24: qty 1
  Filled 2014-02-24 (×7): qty 2

## 2014-02-24 MED ORDER — RENA-VITE PO TABS
1.0000 | ORAL_TABLET | Freq: Every day | ORAL | Status: DC
  Administered 2014-02-24 – 2014-02-25 (×2): 1 via ORAL
  Filled 2014-02-24 (×3): qty 1

## 2014-02-24 MED ORDER — PIPERACILLIN-TAZOBACTAM 3.375 G IVPB 30 MIN
3.3750 g | Freq: Once | INTRAVENOUS | Status: AC
  Administered 2014-02-24: 3.375 g via INTRAVENOUS

## 2014-02-24 MED ORDER — INSULIN DETEMIR 100 UNIT/ML ~~LOC~~ SOLN
20.0000 [IU] | Freq: Every evening | SUBCUTANEOUS | Status: DC
  Administered 2014-02-24 – 2014-02-25 (×3): 20 [IU] via SUBCUTANEOUS
  Filled 2014-02-24 (×6): qty 0.2

## 2014-02-24 MED ORDER — LORATADINE 10 MG PO TABS
10.0000 mg | ORAL_TABLET | Freq: Every day | ORAL | Status: DC
  Administered 2014-02-24 – 2014-02-26 (×3): 10 mg via ORAL
  Filled 2014-02-24 (×3): qty 1

## 2014-02-24 MED ORDER — ACETAMINOPHEN 325 MG PO TABS: 650.0000 mg | ORAL_TABLET | Freq: Four times a day (QID) | ORAL | Status: DC | PRN

## 2014-02-24 MED ORDER — HYDROMORPHONE HCL 1 MG/ML IJ SOLN
0.5000 mg | INTRAMUSCULAR | Status: DC | PRN
  Administered 2014-02-24 – 2014-02-25 (×2): 0.5 mg via INTRAVENOUS
  Filled 2014-02-24: qty 1

## 2014-02-24 MED ORDER — VANCOMYCIN HCL IN DEXTROSE 1-5 GM/200ML-% IV SOLN
1000.0000 mg | Freq: Once | INTRAVENOUS | Status: AC
  Administered 2014-02-24: 1000 mg via INTRAVENOUS
  Filled 2014-02-24: qty 200

## 2014-02-24 MED ORDER — ONDANSETRON HCL 4 MG PO TABS: 4.0000 mg | ORAL_TABLET | Freq: Four times a day (QID) | ORAL | Status: DC | PRN

## 2014-02-24 MED ORDER — INSULIN ASPART 100 UNIT/ML ~~LOC~~ SOLN
0.0000 [IU] | Freq: Three times a day (TID) | SUBCUTANEOUS | Status: DC
  Administered 2014-02-24 (×2): 3 [IU] via SUBCUTANEOUS
  Administered 2014-02-24: 5 [IU] via SUBCUTANEOUS
  Administered 2014-02-25: 8 [IU] via SUBCUTANEOUS
  Administered 2014-02-26 (×2): 3 [IU] via SUBCUTANEOUS

## 2014-02-24 MED ORDER — DOCUSATE SODIUM 100 MG PO CAPS
100.0000 mg | ORAL_CAPSULE | Freq: Every day | ORAL | Status: DC | PRN
  Administered 2014-02-24: 100 mg via ORAL
  Filled 2014-02-24 (×2): qty 1

## 2014-02-24 MED ORDER — ASPIRIN EC 81 MG PO TBEC
81.0000 mg | DELAYED_RELEASE_TABLET | Freq: Every day | ORAL | Status: DC
  Administered 2014-02-24 – 2014-02-26 (×3): 81 mg via ORAL
  Filled 2014-02-24 (×3): qty 1

## 2014-02-24 MED ORDER — INSULIN ASPART 100 UNIT/ML ~~LOC~~ SOLN
0.0000 [IU] | Freq: Every day | SUBCUTANEOUS | Status: DC
  Administered 2014-02-24: 2 [IU] via SUBCUTANEOUS
  Administered 2014-02-25: 3 [IU] via SUBCUTANEOUS

## 2014-02-24 MED ORDER — MIDODRINE HCL 5 MG PO TABS
5.0000 mg | ORAL_TABLET | ORAL | Status: DC
  Administered 2014-02-25: 5 mg via ORAL
  Filled 2014-02-24 (×2): qty 1

## 2014-02-24 MED ORDER — ACETAMINOPHEN 650 MG RE SUPP: 650.0000 mg | Freq: Four times a day (QID) | RECTAL | Status: DC | PRN

## 2014-02-24 MED ORDER — HEPARIN SODIUM (PORCINE) 5000 UNIT/ML IJ SOLN
5000.0000 [IU] | Freq: Three times a day (TID) | INTRAMUSCULAR | Status: DC
  Administered 2014-02-24 – 2014-02-26 (×7): 5000 [IU] via SUBCUTANEOUS
  Filled 2014-02-24 (×10): qty 1

## 2014-02-24 MED ORDER — POLYETHYLENE GLYCOL 3350 17 G PO PACK
17.0000 g | PACK | Freq: Every day | ORAL | Status: DC | PRN
  Administered 2014-02-24: 17 g via ORAL
  Filled 2014-02-24 (×3): qty 1

## 2014-02-24 MED ORDER — SODIUM THIOSULFATE 25 % IV SOLN
25.0000 g | INTRAVENOUS | Status: DC
  Administered 2014-02-25: 25 g via INTRAVENOUS
  Filled 2014-02-24 (×2): qty 100

## 2014-02-24 NOTE — ED Provider Notes (Signed)
Medical screening examination/treatment/procedure(s) were conducted as a shared visit with non-physician practitioner(s) and myself.  I personally evaluated the patient during the encounter.  Worsening LLQ erythema and induration.  Tachycardia and febrile.  Recent admission for abdominal wall cellulitis.  CT scan 10/30 without fluid collection.  No fluid collection on bedside US.  Sepsis from cellulitis.  Cultures, antibiotics.  MRI and biopsy recommended during last hospitalization. No crepitance.  No evidence of necrotizing fasciitis at this time.  D/w Dr. Posey Pronto.  Will hold off on CT as this time as she recently had one.  EMERGENCY DEPARTMENT US SOFT TISSUE INTERPRETATION "Study: Limited Ultrasound of the noted body part in comments below"  INDICATIONS: Soft tissue infection Multiple views of the body part are obtained with a multi-frequency linear probe  PERFORMED BY:  Myself  IMAGES ARCHIVED?: No  SIDE:Left  BODY PART:Abdominal wall  FINDINGS: No abcess noted and Cellulitis present  LIMITATIONS:  Body Habitus and Emergent Procedure  INTERPRETATION:  No abcess noted and Cellulitis present  COMMENT:  Unable to save images as storage full     EKG Interpretation   Date/Time:  Saturday February 23 2014 23:59:47 EST Ventricular Rate:  119 PR Interval:  162 QRS Duration: 84 QT Interval:  328 QTC Calculation: 461 R Axis:   62 Text Interpretation:  Sinus tachycardia Low voltage, precordial leads  Probable anteroseptal infarct, old Borderline repolarization abnormality  worsening inferolateral ST depression Confirmed by Wyvonnia Dusky  MD, Elsey Holts  ([id number]) on 02/24/2014 12:41:21 AM        Ezequiel Essex, MD 02/24/14 1435

## 2014-02-24 NOTE — Consult Note (Addendum)
[facility] Renal Consultation Note  Indication for Consultation:  Management of ESRD/hemodialysis; anemia, hypertension/volume and secondary hyperparathyroidism  HPI: Hannah [name] is a 43 y.o. female with DM type 2, Morbid obesity. Significant ASVD(Carotid, coronary and June 2015 R femoral artery embolec)  admitted with recurrent Left  lower Abdominal Wall Cellulitis . Noted Recent  [facility] 10/18-10/19/15 Dc  On po Augmentin and 01-29-09-14-15 Dc on Po Augmentin admits with similar problem and  This time she states noted redness was spreading with increased pain and "fevers at home'' so she came to  ER.She is scheduled to see Derm, Dr. Nevada Crane in [place] on Nov 18th for skin biopsy set up by her PCP recently per her history.She tolerated her last HD Friday in [place] using her  IJ permcath.HO R arm AVF ligation  24 hrs after insert secondary to steal syndrome 9- 18.15.  [facility] Nov [facility]-15 and is to see Dr. Trula Slade for ov  Fu Nov 23, to discuss ESRD access and possible CEA with bilateral significant carotid artery disease.  Currently abdominal  pain controlled in Hospital   Past Medical History  Diagnosis Date  . High cholesterol   . Diabetic retinopathy   . Peripheral neuropathy     "tips of toes"  . Blind right eye   . CHF (congestive heart failure)   . CAD (coronary artery disease)   . GERD (gastroesophageal reflux disease)   . Hypertension   . History of lung cancer 07/2011    s/p left lower lobectomy  . Diabetes mellitus     IDDM  . Cataract of right eye   . Ulcer of toe of right foot 07/10/2012    great toe  . Breast calcification, left 06/2012  . Acute biphenotypic leukemia   . CKD (chronic kidney disease) stage 5, GFR less than 15 ml/min   . Nephrotic syndrome   . Gastritis     H/o gastritis on prior endoscopy  . Anemia   . Carotid artery disease   . Gallstones   . Myocardial infarction 5/16    Past Surgical History  Procedure Laterality  Date  . Cardiac catheterization  07/16/2011  . Incision and drainage breast abscess Left   . Tubal ligation  1994  . Vitrectomy  2010    2 on left, 1 on right  . Cesarean section  1991; 1994  . Video assisted thoracoscopy (vats)/ lobectomy Left 07/30/2011    left main thoracotomy, left lower lobectomy, mediastinal lymph node dissection  . Lobectomy    . Colonoscopy with esophagogastroduodenoscopy (egd) N/A 08/14/2012    Procedure: COLONOSCOPY WITH ESOPHAGOGASTRODUODENOSCOPY (EGD);  Surgeon: Danie Binder, MD;  [facility];  Service: Endoscopy;  Laterality: N/A;  10:45-moved to [address] to notify pt  . Breast lumpectomy with needle localization Left 11/14/2012    Procedure: BREAST LUMPECTOMY WITH NEEDLE LOCALIZATION;  Surgeon: Marcello Moores A. Cornett, MD;  Location: [place];  Service: General;  Laterality: Left;  . Insertion of dialysis catheter Left 09/12/2013    Procedure: INSERTION OF DIALYSIS CATHETER;  Surgeon: Mal Misty, MD;  Location: [place];  Service: Vascular;  Laterality: Left;  . Embolectomy Right 09/17/2013    Procedure: Thrombectomy of Right Common Femoral Artery;  Surgeon: Elam Dutch, MD;  Location: [facility] OR;  Service: Vascular;  Laterality: Right;  . Endarterectomy femoral Right 09/17/2013    Procedure: Right Femoral Endarterectomy;  Surgeon: Elam Dutch, MD;  Location: [place];  Service: Vascular;  Laterality: Right;  . Patch angioplasty Right 09/17/2013    Procedure: Vein Patch Angioplasty of Right Femoral Artery;  Surgeon: Elam Dutch, MD;  Location: [facility] OR;  Service: Vascular;  Laterality: Right;  . Fasciotomy Right 09/17/2013    Procedure: Four Compartment Fasciotomy;  Surgeon: Elam Dutch, MD;  Location: [place];  Service: Vascular;  Laterality: Right;  . Fasciotomy closure Right 09/19/2013    Procedure: FASCIOTOMY CLOSURE;  Surgeon: Elam Dutch, MD;  Location: [place];  Service: Vascular;  Laterality: Right;  regional block and monitored anesthesia care  used  . I&d extremity Right 09/28/2013    Procedure: IRRIGATION AND DEBRIDEMENT EXTREMITY;  Surgeon: Serafina Mitchell, MD;  Location: [place];  Service: Vascular;  Laterality: Right;  . Application of wound vac Right 09/28/2013    Procedure: APPLICATION OF WOUND VAC;  Surgeon: Serafina Mitchell, MD;  Location: [place];  Service: Vascular;  Laterality: Right;  . Av fistula placement Right 01/03/2014    Procedure: ARTERIOVENOUS (AV) FISTULA CREATION;  Surgeon: Angelia Mould, MD;  Location: [place];  Service: Vascular;  Laterality: Right;  . Ligation of arteriovenous  fistula Right 01/04/2014    Procedure: LIGATION OF ARTERIOVENOUS  FISTULA;  Surgeon: Rosetta Posner, MD;  Location: [facility] OR;  Service: Vascular;  Laterality: Right;      Family History  Problem Relation Age of Onset  . Coronary artery disease Father   . Asthma Father   . COPD Father   . Hypertension Father   . Hyperlipidemia Father   . Diabetes Father   . Congestive Heart Failure Father   . Heart disease Father     before age 37  . Heart attack Father   . Peripheral vascular disease Father   . Hypertension Mother   . Hyperlipidemia Mother   . Diabetes Mother   . Cancer Mother   . Cancer Maternal Aunt      three aunts, bone, breast, ?  . Hypertension Brother   . Diabetes Brother   . Diabetes Sister   . Colon cancer Neg Hx   . Celiac disease Neg Hx   . Crohn's disease Neg Hx   . Ulcerative colitis Neg Hx   . Lung cancer Maternal Grandmother       reports that she quit smoking about 2 years ago. She has never used smokeless tobacco. She reports that she does not drink alcohol or use illicit drugs.   Allergies  Allergen Reactions  . Crestor [Rosuvastatin] Other (See Comments)    Severe muscle weakness  . Nsaids Other (See Comments)    Not allergic, "bad on my kidneys"  . Ciprofloxacin Rash    Prior to Admission medications   Medication Sig Start Date End Date Taking? Authorizing Provider  aspirin EC 81 MG EC tablet  Take 1 tablet (81 mg total) by mouth daily. 08/17/13  Yes Belkys A Regalado, MD  atorvastatin (LIPITOR) 80 MG tablet Take 1 tablet (80 mg total) by mouth daily at 6 PM. 10/29/13  Yes Amy D Clegg, NP  calcium acetate (PHOSLO) 667 MG capsule Take 2 capsules (1,334 mg total) by mouth 3 (three) times daily with meals. 11/10/13  Yes Hosie Poisson, MD  cyclobenzaprine (FLEXERIL) 10 MG tablet Take 10 mg by mouth 3 (three) times daily as needed for muscle spasms.   Yes Historical Provider, MD  Docusate Calcium (STOOL SOFTENER PO) Take 1-3 capsules by mouth daily as needed (constipation).   Yes  Historical Provider, MD  HYDROmorphone (DILAUDID) 2 MG tablet Take 1-2 tablets (2-4 mg total) by mouth every 4 (four) hours as needed for severe pain. 02/18/14  Yes Alycia Rossetti, MD  insulin aspart (NOVOLOG) 100 UNIT/ML FlexPen 5 Units, Subcutaneous, 3 times daily with meals 02/07/14  Yes Alycia Rossetti, MD  Insulin Detemir (LEVEMIR FLEXPEN) 100 UNIT/ML Pen Inject 20 Units into the skin every evening. 10/29/13  Yes Amy D Clegg, NP  methylcellulose (ARTIFICIAL TEARS) 1 % ophthalmic solution Place 1 drop into both eyes at bedtime as needed (dry eyes).   Yes Historical Provider, MD  midodrine (PROAMATINE) 10 MG tablet Take 5 mg by mouth 3 (three) times a week. Monday, Wednesday and Friday   Yes Historical Provider, MD  multivitamin (RENA-VIT) TABS tablet Take 1 tablet by mouth daily.   Yes Historical Provider, MD  pantoprazole (PROTONIX) 40 MG tablet Take 1 tablet (40 mg total) by mouth daily. 02/18/14  Yes Alycia Rossetti, MD  polyethylene glycol [facility] / GLYCOLAX) packet Take 17 g by mouth daily as needed (constipation). 10/29/13  Yes Amy D Clegg, NP  promethazine (PHENERGAN) 25 MG tablet Take 1 tablet (25 mg total) by mouth every 8 (eight) hours as needed for nausea or vomiting. 02/18/14  Yes Alycia Rossetti, MD  triamcinolone cream (KENALOG) 0.1 % Apply 1 application topically 2 (two) times daily. 02/18/14  Yes Alycia Rossetti, MD  ursodiol (ACTIGALL) 300 MG capsule Take 1 capsule (300 mg total) by mouth 2 (two) times daily. 11/29/13  Yes Ripudeep Krystal Eaton, MD    SAY:[id number] **OR** acetaminophen, cyclobenzaprine, docusate sodium, HYDROmorphone (DILAUDID) injection, HYDROmorphone, ondansetron **OR** ondansetron (ZOFRAN) IV, polyethylene glycol  Results for orders placed or performed during the hospital encounter of 02/23/14 (from the past 48 hour(s))  CBC with Differential     Status: Abnormal   Collection Time: 02/23/14 10:16 PM  Result Value Ref Range   WBC 8.8 4.0 - 10.5 K/uL   RBC 3.88 3.87 - 5.11 MIL/uL   Hemoglobin 11.6 (L) 12.0 - 15.0 g/dL   HCT 38.3 36.0 - 46.0 %   MCV 98.7 78.0 - 100.0 fL   MCH 29.9 26.0 - 34.0 pg   MCHC 30.3 30.0 - 36.0 g/dL   RDW 18.5 (H) 11.5 - 15.5 %   Platelets 321 150 - 400 K/uL   Neutrophils Relative % 79 (H) 43 - 77 %   Neutro Abs 7.1 1.7 - 7.7 K/uL   Lymphocytes Relative 10 (L) 12 - 46 %   Lymphs Abs 0.9 0.7 - 4.0 K/uL   Monocytes Relative 7 3 - 12 %   Monocytes Absolute 0.6 0.1 - 1.0 K/uL   Eosinophils Relative 3 0 - 5 %   Eosinophils Absolute 0.2 0.0 - 0.7 K/uL   Basophils Relative 1 0 - 1 %   Basophils Absolute 0.1 0.0 - 0.1 K/uL  Comprehensive metabolic panel     Status: Abnormal   Collection Time: 02/23/14 10:16 PM  Result Value Ref Range   Sodium 138 137 - 147 mEq/L   Potassium 5.2 3.7 - 5.3 mEq/L   Chloride 96 96 - 112 mEq/L   CO2 27 19 - 32 mEq/L   Glucose, Bld 343 (H) 70 - 99 mg/dL   BUN 34 (H) 6 - 23 mg/dL   Creatinine, Ser 4.66 (H) 0.50 - 1.10 mg/dL   Calcium 9.2 8.4 - 10.5 mg/dL   Total Protein 6.3 6.0 - 8.3 g/dL   Albumin  2.3 (L) 3.5 - 5.2 g/dL   AST 17 0 - 37 U/L   ALT 23 0 - 35 U/L   Alkaline Phosphatase 175 (H) 39 - 117 U/L   Total Bilirubin 0.6 0.3 - 1.2 mg/dL   GFR calc non Af Amer 10 (L) >90 mL/min   GFR calc Af Amer 12 (L) >90 mL/min    Comment: (NOTE) The eGFR has been calculated using the CKD EPI equation. This calculation has  not been validated in all clinical situations. eGFR's persistently <90 mL/min signify possible Chronic Kidney Disease.    Anion gap 15 5 - 15  Urinalysis, Routine w reflex microscopic     Status: Abnormal   Collection Time: 02/23/14 10:41 PM  Result Value Ref Range   Color, Urine YELLOW YELLOW   APPearance TURBID (A) CLEAR   Specific Gravity, Urine 1.024 1.005 - 1.030   pH 6.0 5.0 - 8.0   Glucose, UA 500 (A) NEGATIVE mg/dL   Hgb urine dipstick LARGE (A) NEGATIVE   Bilirubin Urine NEGATIVE NEGATIVE   Ketones, ur 15 (A) NEGATIVE mg/dL   Protein, ur >300 (A) NEGATIVE mg/dL   Urobilinogen, UA 0.2 0.0 - 1.0 mg/dL   Nitrite NEGATIVE NEGATIVE   Leukocytes, UA LARGE (A) NEGATIVE  Pregnancy, urine     Status: None   Collection Time: 02/23/14 10:41 PM  Result Value Ref Range   Preg Test, Ur NEGATIVE NEGATIVE    Comment:        THE SENSITIVITY OF THIS METHODOLOGY IS >20 mIU/mL.   Urine microscopic-add on     Status: Abnormal   Collection Time: 02/23/14 10:41 PM  Result Value Ref Range   Squamous Epithelial / LPF FEW (A) RARE   WBC, UA TOO NUMEROUS TO COUNT <3 WBC/hpf   RBC / HPF 7-10 <3 RBC/hpf   Bacteria, UA MANY (A) RARE  I-Stat CG4 Lactic Acid, ED     Status: None   Collection Time: 02/24/14 12:13 AM  Result Value Ref Range   Lactic Acid, Venous 1.25 0.5 - 2.2 mmol/L  Troponin I     Status: None   Collection Time: 02/24/14 12:45 AM  Result Value Ref Range   Troponin I <0.30 <0.30 ng/mL    Comment:        Due to the release kinetics of cTnI, a negative result within the first hours of the onset of symptoms does not rule out myocardial infarction with certainty. If myocardial infarction is still suspected, repeat the test at appropriate intervals.   Glucose, capillary     Status: Abnormal   Collection Time: 02/24/14  2:56 AM  Result Value Ref Range   Glucose-Capillary 236 (H) 70 - 99 mg/dL  Glucose, capillary     Status: Abnormal   Collection Time: 02/24/14  7:17 AM   Result Value Ref Range   Glucose-Capillary 161 (H) 70 - 99 mg/dL  CBC     Status: Abnormal   Collection Time: 02/24/14  7:52 AM  Result Value Ref Range   WBC 8.1 4.0 - 10.5 K/uL   RBC 3.89 3.87 - 5.11 MIL/uL   Hemoglobin 11.3 (L) 12.0 - 15.0 g/dL   HCT 38.2 36.0 - 46.0 %   MCV 98.2 78.0 - 100.0 fL   MCH 29.0 26.0 - 34.0 pg   MCHC 29.6 (L) 30.0 - 36.0 g/dL   RDW 18.6 (H) 11.5 - 15.5 %   Platelets 313 150 - 400 K/uL  Comprehensive metabolic panel  Status: Abnormal   Collection Time: 02/24/14  7:52 AM  Result Value Ref Range   Sodium 137 137 - 147 mEq/L   Potassium 4.3 3.7 - 5.3 mEq/L    Comment: DELTA CHECK NOTED   Chloride 97 96 - 112 mEq/L   CO2 23 19 - 32 mEq/L   Glucose, Bld 158 (H) 70 - 99 mg/dL   BUN 38 (H) 6 - 23 mg/dL   Creatinine, Ser 4.96 (H) 0.50 - 1.10 mg/dL   Calcium 9.4 8.4 - 10.5 mg/dL   Total Protein 6.5 6.0 - 8.3 g/dL   Albumin 2.4 (L) 3.5 - 5.2 g/dL   AST 21 0 - 37 U/L   ALT 22 0 - 35 U/L   Alkaline Phosphatase 162 (H) 39 - 117 U/L   Total Bilirubin 0.6 0.3 - 1.2 mg/dL   GFR calc non Af Amer 10 (L) >90 mL/min   GFR calc Af Amer 11 (L) >90 mL/min    Comment: (NOTE) The eGFR has been calculated using the CKD EPI equation. This calculation has not been validated in all clinical situations. eGFR's persistently <90 mL/min signify possible Chronic Kidney Disease.    Anion gap 17 (H) 5 - 15  Protime-INR     Status: None   Collection Time: 02/24/14  7:52 AM  Result Value Ref Range   Prothrombin Time 14.8 11.6 - 15.2 seconds   INR 1.15 0.00 - 1.49  Glucose, capillary     Status: Abnormal   Collection Time: 02/24/14 11:56 AM  Result Value Ref Range   Glucose-Capillary 174 (H) 70 - 99 mg/dL    ROS: Only positives as listed in HPI with ROS   Physical Exam: Filed Vitals:   02/24/14 1009  BP: 100/60  Pulse: 112  Temp: 98.7 F (37.1 C)  Resp: 18     General: Obese WF, alert , nad, OX3 appears appropriate  HEENT: [place] , Nonicteric, MMM Neck: NO  JVD, Bilat. Carotid Bruits Heart: RRR, soft 1/6 sem LSB, no rub or gallop Lungs: CTA bilat , unlabored breathing Abdomen:Obese, bs pos, soft ,Tender Left lower quad with hard nodular  Red areas in Left lower region Extremities: bilat 1 + bipedal edema Skin: Left lower Abd .Pannus  Area erythematous  No DC / R lower leg bandaged not removed dry and clean/ Bilat lower legs with chronic type skin changes but no foot ulcers/ upper and lower back with Excoriations Neuro: OX3 , no  Acute focal deficits Dialysis Access: L IJ P. cath  Dialysis Orders: Center: [facility]  on [facility]  . EDW 115.5kg HD Bath 2.0ca, 2.0 k  Time 4.0 hrs Heparin 5,000. Access L IJ permcath ( had vein mapping Feb 21, 2014 [facility]) BFR 400 DFR af 2.0    Hec. 5 mcg IV/HD aranesp 100   Units IV/HD weekly  Venofer  169m iv qhd loading stop 03/04/14 then  589mweekly hd  Other hgb 10.5 last op lab and 17% tfs/ ca 10.0 / phos 6.4 with  ipth 265   Assessment/Plan 1. Recurrent Left Lower Abdominal Wall Cellulitic type appearance- admit team wu on Vanco/ Zosyn IV  Noted MRI ordered/ Last PTH was 261 /?? bx in hosp needed as this is her 3rd admit This is prob not cellulitis, it has never resolved and is slowly getting worse over a one month period since it was first noticed; is getting more purplish, severely tender and is clinically consistent with calciphylaxis.  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  Will  start empiric Rx - stop all Ca / vit D products (docusate calcium and phoslo in her case), start NaThio TIW and do daily HD this week. The recommended regimen is low Ca bath with daily HD for 7days then extra HD (4-6/wk) until lesions have healed, which usually takes a month or more. The low Ca bath is very important. Should not need abx or debridement in general. Debridement in case of infected wounds would be the only exception. Would plan on keeping her in the hospital for at least the first 7 days of daily dialysis.  Will follow. May need to arrange for a  deep skin biopsy this week as well (derm or gen surg). Get OP records for recent PTH, if very high consider PTX or sensipar.  2. ESRD -  HD  On Schedule MWF fu labs pre hd/ has fu with [facility] for [facility]  Access Nov 23rd 3. Hypertension/volume  - bp 100/60  On midodrine /Stable Vol last hd close to edw/ currently ~~1 kg >edw The other striking issue is what looks like chronic volume overload with anasarca of the lower extremities primarily.  She is "close to dry wt" but has prob more than  10kg of fluid in her legs.  This should improve with extra HD but education about Na and fluid intake would probably be worth trying while she is here.  4. Anemia  - hgb 11.3 was on  ESA  174mg  As op  and weekly fe / fu hgb in hosp .  5. Metabolic bone disease -   Continue Hect 516m q hd  And phos binder  phoslo fu ca / phos  Level  6. DM  Type 2 - per admit 7. ASCVD- Per admit   DaErnest HaberPA-C [phone]/11/2013, 1:29 PM    Pt seen, examined, agree w assess/plan as above with additions as indicated.  RoKelly SplinterD pager [phone]  cell [phone]/11/2013, 6:34 PM

## 2014-02-24 NOTE — H&P (Signed)
[facility] History and Physical  Patient: Hannah [name]  DUK:[id number]  DOB: Apr 08, 1971  DOS: the patient was seen and examined on 02/24/2014 PCP: Vic Blackbird, MD  Chief Complaint: abdominal pain  HPI: Hannah [name] is a 43 y.o. female with Past medical history of diabetic retinopathy, ESRD, diabetes, CHF, CAD, GERD, hypertension, morbid obesity. The patient is presenting with complaints of redness and pain of the abdomen. she mentions that she has been having dizziness area of pain 2 months ago and has been treated with antibiotics and following up with her primary care doctor as an outpatient. She had a CT scan done on 103015, which showed persistent cellulitis and was recommended for further workup with an MRI as well as a biopsy. Yesterday the patient mentions that she has noted that there was further worsening of the redness which was spreading towards her back on the left as well as had some fever and worsening of the pain and therefore she decided to come to the hospital. She denies any nausea or vomiting chills diarrhea or constipation or focal deficit. She denies any recent change in her medication other than increasing the pain medications. She also has multiple scratch on the back as well as on the legs which she  Mentions is due to scratching and itching. She has swelling and redness of both legs. She mentions her swelling is chronic and the redness is on and off since last many months. She has a tunneled dialysis catheter.  The patient is coming from home. And at her baseline independent for most of her ADL.  Review of Systems: as mentioned in the history of present illness.  A Comprehensive review of the other systems is negative.  Past Medical History  Diagnosis Date  . High cholesterol   . Diabetic retinopathy   . Peripheral neuropathy     "tips of toes"  . Blind right eye   . CHF (congestive heart failure)   . CAD (coronary artery disease)   . GERD  (gastroesophageal reflux disease)   . Hypertension   . History of lung cancer 07/2011    s/p left lower lobectomy  . Diabetes mellitus     IDDM  . Cataract of right eye   . Ulcer of toe of right foot 07/10/2012    great toe  . Breast calcification, left 06/2012  . Acute biphenotypic leukemia   . CKD (chronic kidney disease) stage 5, GFR less than 15 ml/min   . Nephrotic syndrome   . Gastritis     H/o gastritis on prior endoscopy  . Anemia   . Carotid artery disease   . Gallstones   . Myocardial infarction 5/16   Past Surgical History  Procedure Laterality Date  . Cardiac catheterization  07/16/2011  . Incision and drainage breast abscess Left   . Tubal ligation  1994  . Vitrectomy  2010    2 on left, 1 on right  . Cesarean section  1991; 1994  . Video assisted thoracoscopy (vats)/ lobectomy Left 07/30/2011    left main thoracotomy, left lower lobectomy, mediastinal lymph node dissection  . Lobectomy    . Colonoscopy with esophagogastroduodenoscopy (egd) N/A 08/14/2012    Procedure: COLONOSCOPY WITH ESOPHAGOGASTRODUODENOSCOPY (EGD);  Surgeon: Danie Binder, MD;  [facility];  Service: Endoscopy;  Laterality: N/A;  10:45-moved to [address] to notify pt  . Breast lumpectomy with needle localization Left 11/14/2012    Procedure: BREAST LUMPECTOMY WITH NEEDLE LOCALIZATION;  Surgeon: Joyice Faster. Cornett, MD;  Location: [place];  Service: General;  Laterality: Left;  . Insertion of dialysis catheter Left 09/12/2013    Procedure: INSERTION OF DIALYSIS CATHETER;  Surgeon: Mal Misty, MD;  Location: [place];  Service: Vascular;  Laterality: Left;  . Embolectomy Right 09/17/2013    Procedure: Thrombectomy of Right Common Femoral Artery;  Surgeon: Elam Dutch, MD;  Location: [facility] OR;  Service: Vascular;  Laterality: Right;  . Endarterectomy femoral Right 09/17/2013    Procedure: Right Femoral Endarterectomy;  Surgeon: Elam Dutch, MD;  Location: [facility] OR;  Service: Vascular;   Laterality: Right;  . Patch angioplasty Right 09/17/2013    Procedure: Vein Patch Angioplasty of Right Femoral Artery;  Surgeon: Elam Dutch, MD;  Location: [place];  Service: Vascular;  Laterality: Right;  . Fasciotomy Right 09/17/2013    Procedure: Four Compartment Fasciotomy;  Surgeon: Elam Dutch, MD;  Location: [place];  Service: Vascular;  Laterality: Right;  . Fasciotomy closure Right 09/19/2013    Procedure: FASCIOTOMY CLOSURE;  Surgeon: Elam Dutch, MD;  Location: [place];  Service: Vascular;  Laterality: Right;  regional block and monitored anesthesia care used  . I&d extremity Right 09/28/2013    Procedure: IRRIGATION AND DEBRIDEMENT EXTREMITY;  Surgeon: Serafina Mitchell, MD;  Location: [place];  Service: Vascular;  Laterality: Right;  . Application of wound vac Right 09/28/2013    Procedure: APPLICATION OF WOUND VAC;  Surgeon: Serafina Mitchell, MD;  Location: [place];  Service: Vascular;  Laterality: Right;  . Av fistula placement Right 01/03/2014    Procedure: ARTERIOVENOUS (AV) FISTULA CREATION;  Surgeon: Angelia Mould, MD;  Location: [place];  Service: Vascular;  Laterality: Right;  . Ligation of arteriovenous  fistula Right 01/04/2014    Procedure: LIGATION OF ARTERIOVENOUS  FISTULA;  Surgeon: Rosetta Posner, MD;  Location: [place];  Service: Vascular;  Laterality: Right;   Social History:  reports that she quit smoking about 2 years ago. She has never used smokeless tobacco. She reports that she does not drink alcohol or use illicit drugs.  Allergies  Allergen Reactions  . Crestor [Rosuvastatin] Other (See Comments)    Severe muscle weakness  . Nsaids Other (See Comments)    Not allergic, "bad on my kidneys"  . Ciprofloxacin Rash    Family History  Problem Relation Age of Onset  . Coronary artery disease Father   . Asthma Father   . COPD Father   . Hypertension Father   . Hyperlipidemia Father   . Diabetes Father   . Congestive Heart Failure Father   . Heart disease  Father     before age 27  . Heart attack Father   . Peripheral vascular disease Father   . Hypertension Mother   . Hyperlipidemia Mother   . Diabetes Mother   . Cancer Mother   . Cancer Maternal Aunt      three aunts, bone, breast, ?  . Hypertension Brother   . Diabetes Brother   . Diabetes Sister   . Colon cancer Neg Hx   . Celiac disease Neg Hx   . Crohn's disease Neg Hx   . Ulcerative colitis Neg Hx   . Lung cancer Maternal Grandmother     Prior to Admission medications   Medication Sig Start Date End Date Taking? Authorizing Provider  aspirin EC 81 MG EC tablet Take 1 tablet (81 mg total) by mouth daily. 08/17/13  Yes Belkys  A Regalado, MD  atorvastatin (LIPITOR) 80 MG tablet Take 1 tablet (80 mg total) by mouth daily at 6 PM. 10/29/13  Yes Amy D Clegg, NP  calcium acetate (PHOSLO) 667 MG capsule Take 2 capsules (1,334 mg total) by mouth 3 (three) times daily with meals. 11/10/13  Yes Hosie Poisson, MD  cyclobenzaprine (FLEXERIL) 10 MG tablet Take 10 mg by mouth 3 (three) times daily as needed for muscle spasms.   Yes Historical Provider, MD  Docusate Calcium (STOOL SOFTENER PO) Take 1-3 capsules by mouth daily as needed (constipation).   Yes Historical Provider, MD  HYDROmorphone (DILAUDID) 2 MG tablet Take 1-2 tablets (2-4 mg total) by mouth every 4 (four) hours as needed for severe pain. 02/18/14  Yes Alycia Rossetti, MD  insulin aspart (NOVOLOG) 100 UNIT/ML FlexPen 5 Units, Subcutaneous, 3 times daily with meals 02/07/14  Yes Alycia Rossetti, MD  Insulin Detemir (LEVEMIR FLEXPEN) 100 UNIT/ML Pen Inject 20 Units into the skin every evening. 10/29/13  Yes Amy D Clegg, NP  methylcellulose (ARTIFICIAL TEARS) 1 % ophthalmic solution Place 1 drop into both eyes at bedtime as needed (dry eyes).   Yes Historical Provider, MD  midodrine (PROAMATINE) 10 MG tablet Take 5 mg by mouth 3 (three) times a week. Monday, Wednesday and Friday   Yes Historical Provider, MD  multivitamin (RENA-VIT)  TABS tablet Take 1 tablet by mouth daily.   Yes Historical Provider, MD  pantoprazole (PROTONIX) 40 MG tablet Take 1 tablet (40 mg total) by mouth daily. 02/18/14  Yes Alycia Rossetti, MD  polyethylene glycol [facility] / GLYCOLAX) packet Take 17 g by mouth daily as needed (constipation). 10/29/13  Yes Amy D Clegg, NP  promethazine (PHENERGAN) 25 MG tablet Take 1 tablet (25 mg total) by mouth every 8 (eight) hours as needed for nausea or vomiting. 02/18/14  Yes Alycia Rossetti, MD  triamcinolone cream (KENALOG) 0.1 % Apply 1 application topically 2 (two) times daily. 02/18/14  Yes Alycia Rossetti, MD  ursodiol (ACTIGALL) 300 MG capsule Take 1 capsule (300 mg total) by mouth 2 (two) times daily. 11/29/13  Yes Ripudeep Krystal Eaton, MD    Physical Exam: Filed Vitals:   02/24/14 0001 02/24/14 0010 02/24/14 0128 02/24/14 0138  BP: 125/79  110/61 106/75  Pulse: 119  121 120  Temp:  100.7 F (38.2 C)  98.5 F (36.9 C)  TempSrc:  Rectal  Oral  Resp: 12  14 18   Height:      Weight:    116.574 kg (257 lb)  SpO2: 92%  100% 96%    General: Alert, Awake and Oriented to Time, Place and Person. Appear in mild distress Eyes: PERRL ENT: Oral Mucosa clear moist. Neck: no JVD Cardiovascular: S1 and S2 Present, no Murmur, Peripheral Pulses Present Respiratory: Bilateral Air entry equal and Decreased, Clear to Auscultation, noCrackles, no wheezes Abdomen: Bowel Sound present, Soft and non tender Skin: redness and erythema on legs and lower abdominal Rash Extremities: bilateral Pedal edema, non calf tenderness Neurologic: Grossly no focal neuro deficit.  Labs on Admission:  CBC:  Recent Labs Lab 02/23/14 2216  WBC 8.8  NEUTROABS 7.1  HGB 11.6*  HCT 38.3  MCV 98.7  PLT 321    CMP     Component Value Date/Time   NA 138 02/23/2014 2216   K 5.2 02/23/2014 2216   CL 96 02/23/2014 2216   CO2 27 02/23/2014 2216   GLUCOSE 343* 02/23/2014 2216   BUN 34* 02/23/2014  2216   CREATININE 4.66* 02/23/2014  2216   CREATININE 4.67* 02/14/2014 1630   CALCIUM 9.2 02/23/2014 2216   PROT 6.3 02/23/2014 2216   ALBUMIN 2.3* 02/23/2014 2216   AST 17 02/23/2014 2216   ALT 23 02/23/2014 2216   ALKPHOS 175* 02/23/2014 2216   BILITOT 0.6 02/23/2014 2216   GFRNONAA 10* 02/23/2014 2216   GFRNONAA 11* 02/14/2014 1630   GFRAA 12* 02/23/2014 2216   GFRAA 12* 02/14/2014 1630    No results for input(s): LIPASE, AMYLASE in the last 168 hours. No results for input(s): AMMONIA in the last 168 hours.   Recent Labs Lab 02/24/14 0045  TROPONINI <0.30   BNP (last 3 results)  Recent Labs  08/13/13 1949 09/06/13 0740  PROBNP 34187.0* 99774.1*    Radiological Exams on Admission: No results found.  Assessment/Plan Principal Problem:   Sepsis affecting skin Active Problems:   Diabetic retinopathy associated with type 2 diabetes mellitus   CAD- severe 3V CAD May 2015- turned down for CABG   Cerebrovascular disease   GERD (gastroesophageal reflux disease)   ESRD (end stage renal disease) on dialysis   Anemia of chronic disease   Cardiomyopathy, ischemic- EF 25-30% July 2015   Abdominal wall cellulitis   1. Sepsis affecting skin The patient is presenting with complaints of fever, abdominal pain and redness. She found to have leukocytosis as well as tachycardia. With this she appears to have cellulitis causing sepsis. I would treat her with vancomycin and Zosyn. Due to her dialysis status I would be gradual in fluid replacement. I encouraged her to drink orally at present.follow cultures I will get MRI of her abdomen in the morning for further workup.  2.ESRD on hemodialysis. Continue hemodialysis Monday Wednesday Friday.  3. Diabetes. Continue home medication and placing her on sliding scale.  4.peripheral vascular disease Coronary artery disease Continue aspirin.  Advance goals of care discussion: full code   DVT Prophylaxis: subcutaneous Heparin Nutrition: renal diet  Family  Communication: family was present at bedside, opportunity was given to ask question and all questions were answered satisfactorily at the time of interview. Disposition: Admitted to inpatient in telemetry unit.  Author: Berle Mull, MD [facility] Pager: [phone] 02/24/2014, 2:50 AM    If 7PM-7AM, please contact night-coverage [email or web address] Password [email or web address]

## 2014-02-24 NOTE — ED Notes (Signed)
Attempted to call report

## 2014-02-24 NOTE — Plan of Care (Signed)
Problem: Phase I Progression Outcomes Goal: OOB as tolerated unless otherwise ordered Outcome: Completed/Met Date Met:  02/24/14 Goal: Voiding-avoid urinary catheter unless indicated Outcome: Completed/Met Date Met:  02/24/14

## 2014-02-24 NOTE — Progress Notes (Signed)
ANTIBIOTIC CONSULT NOTE - INITIAL  Pharmacy Consult for Vancocin and Zosyn Indication: cellulitis and UTI  Allergies  Allergen Reactions  . Crestor [Rosuvastatin] Other (See Comments)    Severe muscle weakness  . Nsaids Other (See Comments)    Not allergic, "bad on my kidneys"  . Ciprofloxacin Rash    Patient Measurements: Height: 5\' 7"  (170.2 cm) Weight: 257 lb (116.574 kg) IBW/kg (Calculated) : 61.6  Vital Signs: Temp: 98.5 F (36.9 C) (11/08 0138) Temp Source: Oral (11/08 0138) BP: 106/75 mmHg (11/08 0138) Pulse Rate: 120 (11/08 0138) Intake/Output from previous day: 11/07 0701 - 11/08 0700 In: 120 [P.O.:120] Out: 50 [Urine:50] Intake/Output from this shift: Total I/O In: 120 [P.O.:120] Out: 50 [Urine:50]  Labs:  Recent Labs  02/23/14 2216  WBC 8.8  HGB 11.6*  PLT 321  CREATININE 4.66*   Estimated Creatinine Clearance: 20.1 mL/min (by C-G formula based on Cr of 4.66).   Microbiology: Recent Results (from the past 720 hour(s))  MRSA PCR Screening     Status: None   Collection Time: 02/03/14  3:14 AM  Result Value Ref Range Status   MRSA by PCR NEGATIVE NEGATIVE Final    Comment:        The GeneXpert MRSA Assay (FDA approved for NASAL specimens only), is one component of a comprehensive MRSA colonization surveillance program. It is not intended to diagnose MRSA infection nor to guide or monitor treatment for MRSA infections.    Medical History: Past Medical History  Diagnosis Date  . High cholesterol   . Diabetic retinopathy   . Peripheral neuropathy     "tips of toes"  . Blind right eye   . CHF (congestive heart failure)   . CAD (coronary artery disease)   . GERD (gastroesophageal reflux disease)   . Hypertension   . History of lung cancer 07/2011    s/p left lower lobectomy  . Diabetes mellitus     IDDM  . Cataract of right eye   . Ulcer of toe of right foot 07/10/2012    great toe  . Breast calcification, left 06/2012  . Acute  biphenotypic leukemia   . CKD (chronic kidney disease) stage 5, GFR less than 15 ml/min   . Nephrotic syndrome   . Gastritis     H/o gastritis on prior endoscopy  . Anemia   . Carotid artery disease   . Gallstones   . Myocardial infarction 5/16    Medications:  Prescriptions prior to admission  Medication Sig Dispense Refill Last Dose  . aspirin EC 81 MG EC tablet Take 1 tablet (81 mg total) by mouth daily. 30 tablet 0 02/23/2014 at Unknown time  . atorvastatin (LIPITOR) 80 MG tablet Take 1 tablet (80 mg total) by mouth daily at 6 PM. 30 tablet 6 02/23/2014 at Unknown time  . calcium acetate (PHOSLO) 667 MG capsule Take 2 capsules (1,334 mg total) by mouth 3 (three) times daily with meals. 90 capsule 0 02/23/2014 at Unknown time  . cyclobenzaprine (FLEXERIL) 10 MG tablet Take 10 mg by mouth 3 (three) times daily as needed for muscle spasms.   Past Week at Unknown time  . Docusate Calcium (STOOL SOFTENER PO) Take 1-3 capsules by mouth daily as needed (constipation).   Past Week at Unknown time  . HYDROmorphone (DILAUDID) 2 MG tablet Take 1-2 tablets (2-4 mg total) by mouth every 4 (four) hours as needed for severe pain. 30 tablet 0 02/23/2014 at Unknown time  . insulin aspart (  NOVOLOG) 100 UNIT/ML FlexPen 5 Units, Subcutaneous, 3 times daily with meals 15 mL 11 02/23/2014 at Unknown time  . Insulin Detemir (LEVEMIR FLEXPEN) 100 UNIT/ML Pen Inject 20 Units into the skin every evening. 15 mL 11 02/23/2014 at Unknown time  . methylcellulose (ARTIFICIAL TEARS) 1 % ophthalmic solution Place 1 drop into both eyes at bedtime as needed (dry eyes).   02/23/2014 at Unknown time  . midodrine (PROAMATINE) 10 MG tablet Take 5 mg by mouth 3 (three) times a week. Monday, Wednesday and Friday   Past Week at Unknown time  . multivitamin (RENA-VIT) TABS tablet Take 1 tablet by mouth daily.   02/23/2014 at Unknown time  . pantoprazole (PROTONIX) 40 MG tablet Take 1 tablet (40 mg total) by mouth daily. 30 tablet 6 Past  Month at Unknown time  . polyethylene glycol (MIRALAX / GLYCOLAX) packet Take 17 g by mouth daily as needed (constipation). 14 each 6 Past Week at Unknown time  . promethazine (PHENERGAN) 25 MG tablet Take 1 tablet (25 mg total) by mouth every 8 (eight) hours as needed for nausea or vomiting. 20 tablet 2 Past Week at Unknown time  . triamcinolone cream (KENALOG) 0.1 % Apply 1 application topically 2 (two) times daily. 45 g 1 02/23/2014 at Unknown time  . ursodiol (ACTIGALL) 300 MG capsule Take 1 capsule (300 mg total) by mouth 2 (two) times daily. 60 capsule 4 02/23/2014 at Unknown time   Scheduled:  . aspirin EC  81 mg Oral Daily  . calcium acetate  1,334 mg Oral TID WC  . heparin  5,000 Units Subcutaneous 3 times per day  . insulin aspart  0-15 Units Subcutaneous TID WC  . insulin aspart  0-5 Units Subcutaneous QHS  . insulin detemir  20 Units Subcutaneous QPM  . [START ON 02/25/2014] midodrine  5 mg Oral Once per day on Mon Wed Fri  . pantoprazole  40 mg Oral Daily  . sodium chloride  3 mL Intravenous Q12H  . ursodiol  300 mg Oral BID    Assessment: 43yo female c/o abdominal soreness and bruising x69mo w/ increased pain now, was recently discharged after tx for cellulitis and reports that cellulitic area on abdomen has doubled in size, no abscess on [name], meets SIRS criteria and evidence of UTI, to begin IV ABX.  Goal of Therapy:  Pre-HD vanc level 15-25  Plan:  Rec'd vanc 1g and Zosyn 3.375g IV in ED; will give additional vanc 1000mg  to complete load of 2g then begin vancomycin 1000mg  IV after each HD as well as Zosyn 2.25g IV Q8H and monitor CBC, Cx, levels prn.  Wynona Neat, PharmD, BCPS  02/24/2014,1:50 AM

## 2014-02-24 NOTE — Progress Notes (Signed)
New Admission Note:   Arrival Method: Patient transferred via stretcher by Herbie Baltimore, RN Mental Orientation: Alert and oriented x 4 Telemetry: Yes; ST. Patient on tele box 6E10 Assessment: Completed Skin: Warm, dry, redness on BLE, cellulitis and reddened area on lower abdomen  IV: Left Antecubital Pain: Patient denies having pain Tubes: Right Upper subclavian HD catheter Safety Measures: Safety Fall Prevention Plan has been given, discussed and signed Admission: Completed [place] [facility] Orientation: Patient has been orientated to the room, unit and staff.  Family: None present.   Orders have been reviewed and implemented. Will continue to monitor the patient. Call light has been placed within reach and bed alarm has been activated.   Nonie Hoyer BSN, RN Phone number:

## 2014-02-24 NOTE — Plan of Care (Signed)
Problem: Consults Goal: Skin Care Protocol Initiated - if Braden Score 18 or less If consults are not indicated, leave blank or document N/A Outcome: Not Applicable Date Met:  87/86/76 Goal: Diabetes Guidelines if Diabetic/Glucose > 140 If diabetic or lab glucose is > 140 mg/dl - Initiate Diabetes/Hyperglycemia Guidelines & Document Interventions  Outcome: Completed/Met Date Met:  02/24/14  Problem: Phase I Progression Outcomes Goal: Pain controlled with appropriate interventions Outcome: Completed/Met Date Met:  02/24/14

## 2014-02-24 NOTE — H&P (Signed)
Patient admitted this morning 02/24/2014, please see the details of H&Pdone by Dr. Posey Pronto. Subjective: 43 year old female with a history of diabetic retinopathy, ESRD, diabetes mellitus, CHF, CAD, hypertension, admitted with abdominal wall cellulitis. Patient started on antibiotics vancomycin and Zosyn.  Filed Vitals:   02/24/14 1009  BP: 100/60  Pulse: 112  Temp: 98.7 F (37.1 C)  Resp: 18    Chest: Clear Bilaterally Heart : S1S2 RRR Abdomen: Soft, nontender.  Ext : No edema Skin; Erythematous rash noted in the lower abdominal wall Neuro: Alert, oriented x 3  A/P Cellulitis ESRD Diabetes mellitus  Cellulitis We'll  Continue with vancomycin and Zosyn. I feel that it is more localized dermatitis.we'll start Claritin 10 mg by mouth daily  Diabetes mellitus Start sliding scale insulin with NovoLog.  ESRD Patient is on on hemodialysis,will consult nephrology     [facility] Pager- [phone]

## 2014-02-25 ENCOUNTER — Encounter (HOSPITAL_COMMUNITY): Payer: Self-pay | Admitting: General Surgery

## 2014-02-25 LAB — BASIC METABOLIC PANEL
ANION GAP: 17 — AB (ref 5–15)
BUN: 49 mg/dL — ABNORMAL HIGH (ref 6–23)
CHLORIDE: 96 meq/L (ref 96–112)
CO2: 24 mEq/L (ref 19–32)
CREATININE: 5.86 mg/dL — AB (ref 0.50–1.10)
Calcium: 8.9 mg/dL (ref 8.4–10.5)
GFR calc non Af Amer: 8 mL/min — ABNORMAL LOW (ref >90)
GFR, EST AFRICAN AMERICAN: 9 mL/min — AB (ref >90)
Glucose, Bld: 149 mg/dL — ABNORMAL HIGH (ref 70–99)
Potassium: 4.5 mEq/L (ref 3.7–5.3)
Sodium: 137 mEq/L (ref 137–147)

## 2014-02-25 LAB — CBC
HCT: 34.8 % — ABNORMAL LOW (ref 36.0–46.0)
Hemoglobin: 10.6 g/dL — ABNORMAL LOW (ref 12.0–15.0)
MCH: 30.2 pg (ref 26.0–34.0)
MCHC: 30.5 g/dL (ref 30.0–36.0)
MCV: 99.1 fL (ref 78.0–100.0)
PLATELETS: 285 10*3/uL (ref 150–400)
RBC: 3.51 MIL/uL — ABNORMAL LOW (ref 3.87–5.11)
RDW: 18.3 % — ABNORMAL HIGH (ref 11.5–15.5)
WBC: 8 10*3/uL (ref 4.0–10.5)

## 2014-02-25 LAB — GLUCOSE, CAPILLARY
GLUCOSE-CAPILLARY: 86 mg/dL (ref 70–99)
Glucose-Capillary: 279 mg/dL — ABNORMAL HIGH (ref 70–99)
Glucose-Capillary: 293 mg/dL — ABNORMAL HIGH (ref 70–99)

## 2014-02-25 LAB — PHOSPHORUS: PHOSPHORUS: 7.2 mg/dL — AB (ref 2.3–4.6)

## 2014-02-25 MED ORDER — CINACALCET HCL 30 MG PO TABS
30.0000 mg | ORAL_TABLET | Freq: Every day | ORAL | Status: DC
  Administered 2014-02-26: 30 mg via ORAL
  Filled 2014-02-25 (×2): qty 1

## 2014-02-25 MED ORDER — MIDODRINE HCL 5 MG PO TABS
ORAL_TABLET | ORAL | Status: AC
  Administered 2014-02-25: 5 mg via ORAL
  Filled 2014-02-25: qty 1

## 2014-02-25 MED ORDER — DARBEPOETIN ALFA 60 MCG/0.3ML IJ SOSY
PREFILLED_SYRINGE | INTRAMUSCULAR | Status: AC
  Administered 2014-02-25: 60 ug via INTRAVENOUS
  Filled 2014-02-25: qty 0.3

## 2014-02-25 MED ORDER — INSULIN ASPART 100 UNIT/ML ~~LOC~~ SOLN
3.0000 [IU] | Freq: Three times a day (TID) | SUBCUTANEOUS | Status: DC
  Administered 2014-02-25 – 2014-02-26 (×3): 3 [IU] via SUBCUTANEOUS

## 2014-02-25 MED ORDER — LIDOCAINE HCL (PF) 1 % IJ SOLN
INTRAMUSCULAR | Status: AC
  Filled 2014-02-25: qty 5

## 2014-02-25 MED ORDER — LIDOCAINE HCL (PF) 1 % IJ SOLN: 5.0000 mL | Freq: Once | INTRAMUSCULAR | Status: DC

## 2014-02-25 MED ORDER — DARBEPOETIN ALFA 60 MCG/0.3ML IJ SOSY
60.0000 ug | PREFILLED_SYRINGE | INTRAMUSCULAR | Status: DC
  Administered 2014-02-25: 60 ug via INTRAVENOUS

## 2014-02-25 MED ORDER — HYDROMORPHONE HCL 1 MG/ML IJ SOLN
INTRAMUSCULAR | Status: AC
Start: 2014-02-25 — End: 2014-02-25
  Administered 2014-02-25: 0.5 mg via INTRAVENOUS
  Filled 2014-02-25: qty 1

## 2014-02-25 NOTE — Procedures (Signed)
Pt seen on HD.  BFR 320.  Ap 150 Vp 160.  Trying to pull 3L.  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  Will resume aranesp and start sensipar.

## 2014-02-25 NOTE — Consult Note (Signed)
Hannah [name] 03/05/1971  [id number].   Requesting MD: Dr. Fleet Contras Chief Complaint/Reason for Consult: LLQ abdominal wall cellulitis HPI: This is a 43 yo female with multiple medical problems including ESRD on HD.  She was admitted for LLQ abdominal pain that has been present and worsening for the last month.  It has been red with some purple discoloration as well.  She was admitted for pain control.  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  ROS: Please see HPI, otherwise negative  Family History  Problem Relation Age of Onset  . Coronary artery disease Father   . Asthma Father   . COPD Father   . Hypertension Father   . Hyperlipidemia Father   . Diabetes Father   . Congestive Heart Failure Father   . Heart disease Father     before age 30  . Heart attack Father   . Peripheral vascular disease Father   . Hypertension Mother   . Hyperlipidemia Mother   . Diabetes Mother   . Cancer Mother   . Cancer Maternal Aunt      three aunts, bone, breast, ?  . Hypertension Brother   . Diabetes Brother   . Diabetes Sister   . Colon cancer Neg Hx   . Celiac disease Neg Hx   . Crohn's disease Neg Hx   . Ulcerative colitis Neg Hx   . Lung cancer Maternal Grandmother     Past Medical History  Diagnosis Date  . High cholesterol   . Diabetic retinopathy   . Peripheral neuropathy     "tips of toes"  . Blind right eye   . CHF (congestive heart failure)   . CAD (coronary artery disease)   . GERD (gastroesophageal reflux disease)   . Hypertension   . History of lung cancer 07/2011    s/p left lower lobectomy  . Diabetes mellitus     IDDM  . Cataract of right eye   . Ulcer of toe of right foot 07/10/2012    great toe  . Breast calcification, left 06/2012  . Acute biphenotypic leukemia   . CKD (chronic kidney disease) stage 5, GFR less than 15 ml/min   . Nephrotic syndrome   . Gastritis     H/o gastritis on prior  endoscopy  . Anemia   . Carotid artery disease   . Gallstones   . Myocardial infarction 5/16    Past Surgical History  Procedure Laterality Date  . Cardiac catheterization  07/16/2011  . Incision and drainage breast abscess Left   . Tubal ligation  1994  . Vitrectomy  2010    2 on left, 1 on right  . Cesarean section  1991; 1994  . Video assisted thoracoscopy (vats)/ lobectomy Left 07/30/2011    left main thoracotomy, left lower lobectomy, mediastinal lymph node dissection  . Lobectomy    . Colonoscopy with esophagogastroduodenoscopy (egd) N/A 08/14/2012    Procedure: COLONOSCOPY WITH ESOPHAGOGASTRODUODENOSCOPY (EGD);  Surgeon: Danie Binder, MD;  [facility];  Service: Endoscopy;  Laterality: N/A;  10:45-moved to [address] to notify pt  . Breast lumpectomy with needle localization Left 11/14/2012    Procedure: BREAST LUMPECTOMY WITH NEEDLE LOCALIZATION;  Surgeon: Marcello Moores A. Cornett, MD;  Location: [place];  Service: General;  Laterality: Left;  . Insertion of dialysis catheter Left 09/12/2013    Procedure: INSERTION OF DIALYSIS CATHETER;  Surgeon: Mal Misty, MD;  Location: [facility];  Service: Vascular;  Laterality: Left;  . Embolectomy Right 09/17/2013    Procedure: Thrombectomy of Right Common Femoral Artery;  Surgeon: Elam Dutch, MD;  Location: [facility] OR;  Service: Vascular;  Laterality: Right;  . Endarterectomy femoral Right 09/17/2013    Procedure: Right Femoral Endarterectomy;  Surgeon: Elam Dutch, MD;  Location: [facility] OR;  Service: Vascular;  Laterality: Right;  . Patch angioplasty Right 09/17/2013    Procedure: Vein Patch Angioplasty of Right Femoral Artery;  Surgeon: Elam Dutch, MD;  Location: [place];  Service: Vascular;  Laterality: Right;  . Fasciotomy Right 09/17/2013    Procedure: Four Compartment Fasciotomy;  Surgeon: Elam Dutch, MD;  Location: [place];  Service: Vascular;  Laterality: Right;  . Fasciotomy closure Right 09/19/2013    Procedure:  FASCIOTOMY CLOSURE;  Surgeon: Elam Dutch, MD;  Location: [place];  Service: Vascular;  Laterality: Right;  regional block and monitored anesthesia care used  . I&d extremity Right 09/28/2013    Procedure: IRRIGATION AND DEBRIDEMENT EXTREMITY;  Surgeon: Serafina Mitchell, MD;  Location: [place];  Service: Vascular;  Laterality: Right;  . Application of wound vac Right 09/28/2013    Procedure: APPLICATION OF WOUND VAC;  Surgeon: Serafina Mitchell, MD;  Location: [place];  Service: Vascular;  Laterality: Right;  . Av fistula placement Right 01/03/2014    Procedure: ARTERIOVENOUS (AV) FISTULA CREATION;  Surgeon: Angelia Mould, MD;  Location: [place];  Service: Vascular;  Laterality: Right;  . Ligation of arteriovenous  fistula Right 01/04/2014    Procedure: LIGATION OF ARTERIOVENOUS  FISTULA;  Surgeon: Rosetta Posner, MD;  Location: [place];  Service: Vascular;  Laterality: Right;    Social History:  reports that she quit smoking about 2 years ago. She has never used smokeless tobacco. She reports that she does not drink alcohol or use illicit drugs.  Allergies:  Allergies  Allergen Reactions  . Crestor [Rosuvastatin] Other (See Comments)    Severe muscle weakness  . Nsaids Other (See Comments)    Not allergic, "bad on my kidneys"  . Ciprofloxacin Rash    Medications Prior to Admission  Medication Sig Dispense Refill  . aspirin EC 81 MG EC tablet Take 1 tablet (81 mg total) by mouth daily. 30 tablet 0  . atorvastatin (LIPITOR) 80 MG tablet Take 1 tablet (80 mg total) by mouth daily at 6 PM. 30 tablet 6  . calcium acetate (PHOSLO) 667 MG capsule Take 2 capsules (1,334 mg total) by mouth 3 (three) times daily with meals. 90 capsule 0  . cyclobenzaprine (FLEXERIL) 10 MG tablet Take 10 mg by mouth 3 (three) times daily as needed for muscle spasms.    Mariane Baumgarten Calcium (STOOL SOFTENER PO) Take 1-3 capsules by mouth daily as needed (constipation).    Marland Kitchen HYDROmorphone (DILAUDID) 2 MG tablet Take 1-2  tablets (2-4 mg total) by mouth every 4 (four) hours as needed for severe pain. 30 tablet 0  . insulin aspart (NOVOLOG) 100 UNIT/ML FlexPen 5 Units, Subcutaneous, 3 times daily with meals 15 mL 11  . Insulin Detemir (LEVEMIR FLEXPEN) 100 UNIT/ML Pen Inject 20 Units into the skin every evening. 15 mL 11  . methylcellulose (ARTIFICIAL TEARS) 1 % ophthalmic solution Place 1 drop into both eyes at bedtime as needed (dry eyes).    . midodrine (PROAMATINE) 10 MG tablet Take 5 mg by mouth 3 (three) times a week. Monday, Wednesday and Friday    .  multivitamin (RENA-VIT) TABS tablet Take 1 tablet by mouth daily.    . pantoprazole (PROTONIX) 40 MG tablet Take 1 tablet (40 mg total) by mouth daily. 30 tablet 6  . polyethylene glycol (MIRALAX / GLYCOLAX) packet Take 17 g by mouth daily as needed (constipation). 14 each 6  . promethazine (PHENERGAN) 25 MG tablet Take 1 tablet (25 mg total) by mouth every 8 (eight) hours as needed for nausea or vomiting. 20 tablet 2  . triamcinolone cream (KENALOG) 0.1 % Apply 1 application topically 2 (two) times daily. 45 g 1  . ursodiol (ACTIGALL) 300 MG capsule Take 1 capsule (300 mg total) by mouth 2 (two) times daily. 60 capsule 4    Blood pressure 113/68, pulse 99, temperature 97.7 F (36.5 C), temperature source Oral, resp. rate 15, height 5' 7"  (1.702 m), weight 256 lb 6.3 oz (116.3 kg), SpO2 97 %. Physical Exam: General: pleasant, obese white female who is laying in bed in NAD Abd: soft, NT, except over affected area in LLQ, ND, +BS, no masses, hernias, or organomegaly.  She has an area in the LLQ that is erythematous with some faint purple undertones.  This is very tender to palpation and indurated.  Please see procedure note for punch biopsy description. Skin: warm and dry with no masses, lesions, or rashes.  She does have diffuse erythema and reactive erythema of her bilateral lower extremities. Psych: A&Ox3 with an appropriate affect.    Results for orders  placed or performed during the hospital encounter of 02/23/14 (from the past 48 hour(s))  CBC with Differential     Status: Abnormal   Collection Time: 02/23/14 10:16 PM  Result Value Ref Range   WBC 8.8 4.0 - 10.5 K/uL   RBC 3.88 3.87 - 5.11 MIL/uL   Hemoglobin 11.6 (L) 12.0 - 15.0 g/dL   HCT 38.3 36.0 - 46.0 %   MCV 98.7 78.0 - 100.0 fL   MCH 29.9 26.0 - 34.0 pg   MCHC 30.3 30.0 - 36.0 g/dL   RDW 18.5 (H) 11.5 - 15.5 %   Platelets 321 150 - 400 K/uL   Neutrophils Relative % 79 (H) 43 - 77 %   Neutro Abs 7.1 1.7 - 7.7 K/uL   Lymphocytes Relative 10 (L) 12 - 46 %   Lymphs Abs 0.9 0.7 - 4.0 K/uL   Monocytes Relative 7 3 - 12 %   Monocytes Absolute 0.6 0.1 - 1.0 K/uL   Eosinophils Relative 3 0 - 5 %   Eosinophils Absolute 0.2 0.0 - 0.7 K/uL   Basophils Relative 1 0 - 1 %   Basophils Absolute 0.1 0.0 - 0.1 K/uL  Comprehensive metabolic panel     Status: Abnormal   Collection Time: 02/23/14 10:16 PM  Result Value Ref Range   Sodium 138 137 - 147 mEq/L   Potassium 5.2 3.7 - 5.3 mEq/L   Chloride 96 96 - 112 mEq/L   CO2 27 19 - 32 mEq/L   Glucose, Bld 343 (H) 70 - 99 mg/dL   BUN 34 (H) 6 - 23 mg/dL   Creatinine, Ser 4.66 (H) 0.50 - 1.10 mg/dL   Calcium 9.2 8.4 - 10.5 mg/dL   Total Protein 6.3 6.0 - 8.3 g/dL   Albumin 2.3 (L) 3.5 - 5.2 g/dL   AST 17 0 - 37 U/L   ALT 23 0 - 35 U/L   Alkaline Phosphatase 175 (H) 39 - 117 U/L   Total Bilirubin 0.6 0.3 - 1.2  mg/dL   GFR calc non Af Amer 10 (L) >90 mL/min   GFR calc Af Amer 12 (L) >90 mL/min    Comment: (NOTE) The eGFR has been calculated using the CKD EPI equation. This calculation has not been validated in all clinical situations. eGFR's persistently <90 mL/min signify possible Chronic Kidney Disease.    Anion gap 15 5 - 15  Urinalysis, Routine w reflex microscopic     Status: Abnormal   Collection Time: 02/23/14 10:41 PM  Result Value Ref Range   Color, Urine YELLOW YELLOW   APPearance TURBID (A) CLEAR   Specific Gravity,  Urine 1.024 1.005 - 1.030   pH 6.0 5.0 - 8.0   Glucose, UA 500 (A) NEGATIVE mg/dL   Hgb urine dipstick LARGE (A) NEGATIVE   Bilirubin Urine NEGATIVE NEGATIVE   Ketones, ur 15 (A) NEGATIVE mg/dL   Protein, ur >300 (A) NEGATIVE mg/dL   Urobilinogen, UA 0.2 0.0 - 1.0 mg/dL   Nitrite NEGATIVE NEGATIVE   Leukocytes, UA LARGE (A) NEGATIVE  Pregnancy, urine     Status: None   Collection Time: 02/23/14 10:41 PM  Result Value Ref Range   Preg Test, Ur NEGATIVE NEGATIVE    Comment:        THE SENSITIVITY OF THIS METHODOLOGY IS >20 mIU/mL.   Urine microscopic-add on     Status: Abnormal   Collection Time: 02/23/14 10:41 PM  Result Value Ref Range   Squamous Epithelial / LPF FEW (A) RARE   WBC, UA TOO NUMEROUS TO COUNT <3 WBC/hpf   RBC / HPF 7-10 <3 RBC/hpf   Bacteria, UA MANY (A) RARE  Blood culture (routine x 2)     Status: None (Preliminary result)   Collection Time: 02/23/14 11:58 PM  Result Value Ref Range   Specimen Description BLOOD LEFT ARM    Special Requests BOTTLES DRAWN AEROBIC AND ANAEROBIC 10CC EA    Culture  Setup Time      02/24/2014 03:10 Performed at [facility]    Culture             BLOOD CULTURE RECEIVED NO GROWTH TO DATE CULTURE WILL BE HELD FOR 5 DAYS BEFORE ISSUING A FINAL NEGATIVE REPORT Performed at [facility]    Report Status PENDING   Blood culture (routine x 2)     Status: None (Preliminary result)   Collection Time: 02/24/14 12:05 AM  Result Value Ref Range   Specimen Description BLOOD LEFT ARM    Special Requests BOTTLES DRAWN AEROBIC ONLY 10CCS    Culture  Setup Time      02/24/2014 03:10 Performed at [place] NO GROWTH TO DATE CULTURE WILL BE HELD FOR 5 DAYS BEFORE ISSUING A FINAL NEGATIVE REPORT Performed at [facility]    Report Status PENDING   I-Stat CG4 Lactic Acid, ED     Status: None   Collection Time: 02/24/14 12:13 AM  Result Value Ref Range    Lactic Acid, Venous 1.25 0.5 - 2.2 mmol/L  Troponin I     Status: None   Collection Time: 02/24/14 12:45 AM  Result Value Ref Range   Troponin I <0.30 <0.30 ng/mL    Comment:        Due to the release kinetics of cTnI, a negative result within the first hours of the onset of symptoms does not rule out  myocardial infarction with certainty. If myocardial infarction is still suspected, repeat the test at appropriate intervals.   Glucose, capillary     Status: Abnormal   Collection Time: 02/24/14  2:56 AM  Result Value Ref Range   Glucose-Capillary 236 (H) 70 - 99 mg/dL  Glucose, capillary     Status: Abnormal   Collection Time: 02/24/14  7:17 AM  Result Value Ref Range   Glucose-Capillary 161 (H) 70 - 99 mg/dL  CBC     Status: Abnormal   Collection Time: 02/24/14  7:52 AM  Result Value Ref Range   WBC 8.1 4.0 - 10.5 K/uL   RBC 3.89 3.87 - 5.11 MIL/uL   Hemoglobin 11.3 (L) 12.0 - 15.0 g/dL   HCT 38.2 36.0 - 46.0 %   MCV 98.2 78.0 - 100.0 fL   MCH 29.0 26.0 - 34.0 pg   MCHC 29.6 (L) 30.0 - 36.0 g/dL   RDW 18.6 (H) 11.5 - 15.5 %   Platelets 313 150 - 400 K/uL  Comprehensive metabolic panel     Status: Abnormal   Collection Time: 02/24/14  7:52 AM  Result Value Ref Range   Sodium 137 137 - 147 mEq/L   Potassium 4.3 3.7 - 5.3 mEq/L    Comment: DELTA CHECK NOTED   Chloride 97 96 - 112 mEq/L   CO2 23 19 - 32 mEq/L   Glucose, Bld 158 (H) 70 - 99 mg/dL   BUN 38 (H) 6 - 23 mg/dL   Creatinine, Ser 4.96 (H) 0.50 - 1.10 mg/dL   Calcium 9.4 8.4 - 10.5 mg/dL   Total Protein 6.5 6.0 - 8.3 g/dL   Albumin 2.4 (L) 3.5 - 5.2 g/dL   AST 21 0 - 37 U/L   ALT 22 0 - 35 U/L   Alkaline Phosphatase 162 (H) 39 - 117 U/L   Total Bilirubin 0.6 0.3 - 1.2 mg/dL   GFR calc non Af Amer 10 (L) >90 mL/min   GFR calc Af Amer 11 (L) >90 mL/min    Comment: (NOTE) The eGFR has been calculated using the CKD EPI equation. This calculation has not been validated in all clinical situations. eGFR's  persistently <90 mL/min signify possible Chronic Kidney Disease.    Anion gap 17 (H) 5 - 15  Protime-INR     Status: None   Collection Time: 02/24/14  7:52 AM  Result Value Ref Range   Prothrombin Time 14.8 11.6 - 15.2 seconds   INR 1.15 0.00 - 1.49  Urine culture     Status: None (Preliminary result)   Collection Time: 02/24/14 11:05 AM  Result Value Ref Range   Specimen Description URINE, RANDOM    Special Requests NONE    Culture  Setup Time      02/24/2014 19:38 Performed at [place]      75,000 COLONIES/ML Performed at [place] Performed at [facility]    Report Status PENDING   Glucose, capillary     Status: Abnormal   Collection Time: 02/24/14 11:56 AM  Result Value Ref Range   Glucose-Capillary 174 (H) 70 - 99 mg/dL  Glucose, capillary     Status: Abnormal   Collection Time: 02/24/14  6:12 PM  Result Value Ref Range   Glucose-Capillary 220 (H) 70 - 99 mg/dL  Glucose, capillary     Status: Abnormal   Collection Time: 02/24/14  9:35 PM  Result Value Ref Range   Glucose-Capillary 198 (H) 70 - 99 mg/dL  CBC     Status: Abnormal   Collection Time: 02/25/14  7:41 AM  Result Value Ref Range   WBC 8.0 4.0 - 10.5 K/uL   RBC 3.51 (L) 3.87 - 5.11 MIL/uL   Hemoglobin 10.6 (L) 12.0 - 15.0 g/dL   HCT 34.8 (L) 36.0 - 46.0 %   MCV 99.1 78.0 - 100.0 fL   MCH 30.2 26.0 - 34.0 pg   MCHC 30.5 30.0 - 36.0 g/dL   RDW 18.3 (H) 11.5 - 15.5 %   Platelets 285 150 - 400 K/uL  Basic metabolic panel     Status: Abnormal   Collection Time: 02/25/14  7:41 AM  Result Value Ref Range   Sodium 137 137 - 147 mEq/L   Potassium 4.5 3.7 - 5.3 mEq/L   Chloride 96 96 - 112 mEq/L   CO2 24 19 - 32 mEq/L   Glucose, Bld 149 (H) 70 - 99 mg/dL   BUN 49 (H) 6 - 23 mg/dL   Creatinine, Ser 5.86 (H) 0.50 - 1.10 mg/dL   Calcium 8.9 8.4 - 10.5 mg/dL   GFR calc non Af Amer 8 (L) >90 mL/min   GFR calc Af Amer 9 (L) >90  mL/min    Comment: (NOTE) The eGFR has been calculated using the CKD EPI equation. This calculation has not been validated in all clinical situations. eGFR's persistently <90 mL/min signify possible Chronic Kidney Disease.    Anion gap 17 (H) 5 - 15  Phosphorus     Status: Abnormal   Collection Time: 02/25/14  9:30 AM  Result Value Ref Range   Phosphorus 7.2 (H) 2.3 - 4.6 mg/dL  Glucose, capillary     Status: None   Collection Time: 02/25/14 12:04 PM  Result Value Ref Range   Glucose-Capillary 86 70 - 99 mg/dL   Comment 1 Documented in Chart    Comment 2 Notify RN    Mr Pelvis Wo Contrast  02/24/2014   CLINICAL DATA:  Abdominal wall cellulitis with left groin pain.  EXAM: MRI PELVIS WITHOUT CONTRAST  TECHNIQUE: Multiplanar multisequence MR imaging of the pelvis was performed. No intravenous contrast was administered.  COMPARISON:  CT scan 02/15/2014  FINDINGS: Exam is limited by body habitus (morbid obesity).  Both hips are normally located. Mild to moderate degenerative changes bilaterally but no acute bony findings. No stress fracture or AVN. No hip joint effusion. The pubic symphysis and SI joints are intact. No bony pelvic abnormality. Mild edema like signal abnormality in the medial aspect of the adductor muscles and gluteus medius muscles could be nonspecific myositis or muscle strain. The remaining pelvic musculature is grossly intact.  No significant intrapelvic abnormalities are demonstrated. Mild bladder wall thickening.  Diffuse patchy subcutaneous soft tissue swelling/edema and fluid likely reflecting cellulitis. No focal drainable fluid collection to suggest an abscess. I do not see any significant abnormality in the left groin area.  IMPRESSION: 1. Diffuse patchy subcutaneous soft tissue swelling/edema/fluid likely reflecting cellulitis. No focal drainable fluid collection to suggest an abscess. 2. Mild nonspecific muscle edema in the gluteus medius muscles and in the adductor  muscles medially. 3. No significant intrapelvic findings. 4. No significant bony findings.   Electronically Signed   By: Kalman Jewels M.D.   On: 02/24/2014 18:13       Assessment/Plan 1. LLQ abdominal wall cellulitis, ? Calciphylaxis Patient Active Problem List  Diagnosis Date Noted  . Abdominal wall cellulitis 02/24/2014  . Sepsis affecting skin 02/24/2014  . Soft tissue mass 02/18/2014  . Rash and nonspecific skin eruption 02/18/2014  . Legally blind 02/18/2014  . Cellulitis, abdominal wall 02/03/2014  . Diabetes mellitus type 2, uncontrolled 01/29/2014  . Right arm pain 01/24/2014  . Type II diabetes mellitus with neurological manifestations 12/18/2013  . Peripheral vascular disease, unspecified 12/17/2013  . Cardiomyopathy, ischemic- EF 25-30% July 2015 11/26/2013  . Anemia of chronic disease 11/25/2013  . Chronic combined systolic and diastolic CHF, NYHA class 3 11/07/2013  . Cellulitis of right lower extremity 11/06/2013  . ESRD (end stage renal disease) on dialysis 11/06/2013  . Myoclonus 10/02/2013  . Ventricular tachycardia 09/29/2013  . Right groin wound 09/26/2013  . DVT of axillary vein, acute right 09/20/2013  . Nonspecific abnormal unspecified cardiovascular function study   . Other primary cardiomyopathies   . Occlusion and stenosis of carotid artery without mention of cerebral infarction 08/21/2012  . GERD (gastroesophageal reflux disease) 08/08/2012  . Normocytic anemia 08/08/2012  . Gastroparesis 07/21/2012  . Tobacco abuse, in remission 05/05/2012  . Cerebrovascular disease 01/21/2012  . Venous insufficiency 10/12/2011  . Essential hypertension, benign 10/12/2011  . CAD- severe 3V CAD May 2015- turned down for CABG 08/30/2011  . Chronic toe ulcer 08/17/2011  . Diabetes 07/07/2011  . Hyperlipidemia 07/07/2011  . Adenocarcinoma of lung 06/09/2011  . Diabetic retinopathy associated with type 2 diabetes mellitus 06/09/2011   Plan: 1. Punch biopsy has  been completed.  Await biopsy results.  Defer to nephrology if results show calciphylaxis.  Will check wound tomorrow.  Svetlana Bagby E 02/25/2014, 1:24 PM Pager: [phone]

## 2014-02-25 NOTE — Procedures (Signed)
Punch Biopsy Procedure Note  Pre-operative Diagnosis: abdominal wall cellulitis  Post-operative Diagnosis: same  Indications: This is a 43 yo female who is ESRD on HD who for the last month has been having worsening LLQ abdominal pain with cellulitis.  There was a concern for calciphylaxis.  We have been asked to see her for punch biopsy  Anesthesia: 1% plain lidocaine  Procedure Details  The procedure, risks and complications have been discussed in detail (including, but not limited to infection, bleeding) with the patient, and the patient has given verbal consent to the procedure.  The skin was sterilely prepped and draped over the affected area in the usual fashion. After adequate local anesthesia, a 43mm punch was used to obtain a biopsy of her LLQ abdominal wall. The patient was observed until stable.  Findings: none  EBL: 0 cc's  Drains: none  Condition: Tolerated procedure well and Stable   Complications: none.  OSBORNE,KELLY E 1:24 PM 02/25/2014   I was immediately available during the procedure  Leighton Ruff. Redmond Pulling, MD, FACS General, Bariatric, & Minimally Invasive Surgery [facility], [place]

## 2014-02-25 NOTE — Progress Notes (Signed)
Patient Demographics  Hannah [name], is a 43 y.o. female, DOB - [birth year], HYW:[id number]  Admit date - 02/23/2014   Admitting Physician Berle Mull, MD  Outpatient Primary MD for the patient is Vic Blackbird, MD  LOS - 2   Chief Complaint  Patient presents with  . Abdominal Pain     Brief narrative:  This is a 43 yo female with multiple medical problems including ESRD on HD. She was admitted for LLQ abdominal pain that has been present and worsening for the last month. It has been red with some purple discoloration as well. She was admitted for pain control. 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   Subjective:   Hannah [name] today has, No headache, No chest pain, No abdominal pain - No Nausea, No new weakness tingling or numbness, No Cough   Assessment & Plan    Principal Problem:   Sepsis affecting skin Active Problems:   Diabetic retinopathy associated with type 2 diabetes mellitus   CAD- severe 3V CAD May 2015- turned down for CABG   Cerebrovascular disease   GERD (gastroesophageal reflux disease)   ESRD (end stage renal disease) on dialysis   Anemia of chronic disease   Cardiomyopathy, ischemic- EF 25-30% July 2015   Abdominal wall cellulitis  Abdominal wall cellulitis -Patient is on broad-spectrum IV antibiotics, afebrile with no leukocytosis, blood culture showing no growth to date, lunch biopsy obtained 11/9 to rule out calciphylaxis.  End-stage renal disease -On hemodialysis, nephrology consult appreciated  Coronary artery disease -Denies chest pain or shortness of breath -Continue with aspirin  Anemia -Anemia of chronic kidney disease -Continue with Aranesp  Diabetes mellitus -Continue  with Levemir and insulin sliding scale -We will add NovoLog 3 units 3 times a day before meals  Code Status: Full  Family Communication:  Patient is alert and oriented  Disposition Plan: remains inpatient   Procedures  Punch biopsy 11/19   Consults   Nephrology Surgery   Medications  Scheduled Meds: . aspirin EC  81 mg Oral Daily  . [START ON 02/26/2014] cinacalcet  30 mg Oral Q breakfast  . darbepoetin (ARANESP) injection - DIALYSIS  60 mcg Intravenous Q Mon-HD  . heparin  5,000 Units Subcutaneous 3 times per day  . insulin aspart  0-15 Units Subcutaneous TID WC  . insulin aspart  0-5 Units Subcutaneous QHS  . insulin detemir  20 Units Subcutaneous QPM  . lidocaine (PF)  5 mL Intradermal Once  . lidocaine (PF)      . loratadine  10 mg Oral Daily  . midodrine  5 mg Oral Once per day on Mon Wed Fri  . multivitamin  1 tablet Oral QHS  . pantoprazole  40 mg Oral Daily  . piperacillin-tazobactam (ZOSYN)  IV  2.25 g Intravenous Q8H  . sevelamer carbonate  1,600 mg Oral TID WC  . sodium chloride  3 mL Intravenous Q12H  . sodium thiosulfate infusion for calciphylaxis  25 g Intravenous Q M,W,F-HD  . ursodiol  300 mg Oral BID  . vancomycin  1,000 mg Intravenous Q M,W,F-HD   Continuous Infusions:  PRN Meds:.acetaminophen **OR** acetaminophen, cyclobenzaprine,  docusate sodium, HYDROmorphone (DILAUDID) injection, HYDROmorphone, ondansetron **OR** ondansetron (ZOFRAN) IV, polyethylene glycol  DVT Prophylaxis  Heparin   Lab Results  Component Value Date   PLT 285 02/25/2014    Antibiotics    Anti-infectives    Start     Dose/Rate Route Frequency Ordered Stop   02/25/14 1200  vancomycin (VANCOCIN) IVPB 1000 mg/200 mL premix     1,000 mg200 mL/hr over 60 Minutes Intravenous Every M-W-F (Hemodialysis) 02/24/14 0159     02/24/14 0800  piperacillin-tazobactam (ZOSYN) IVPB 2.25 g     2.25 g100 mL/hr over 30 Minutes Intravenous Every 8 hours 02/24/14 0159     02/24/14 0200   vancomycin (VANCOCIN) IVPB 1000 mg/200 mL premix     1,000 mg200 mL/hr over 60 Minutes Intravenous  Once 02/24/14 0159 02/24/14 0409   02/24/14 0030  piperacillin-tazobactam (ZOSYN) IVPB 3.375 g     3.375 g100 mL/hr over 30 Minutes Intravenous  Once 02/24/14 0017 02/24/14 0043   02/24/14 0000  vancomycin (VANCOCIN) IVPB 1000 mg/200 mL premix     1,000 mg200 mL/hr over 60 Minutes Intravenous  Once 02/23/14 2349 02/24/14 0143   02/24/14 0000  piperacillin-tazobactam (ZOSYN) IVPB 3.375 g  Status:  Discontinued     3.375 g12.5 mL/hr over 240 Minutes Intravenous  Once 02/23/14 2349 02/24/14 0016          Objective:   Filed Vitals:   02/25/14 1030 02/25/14 1100 02/25/14 1130 02/25/14 1134  BP: 120/75 116/75 110/67 113/68  Pulse: 99 102 99 99  Temp:    97.7 F (36.5 C)  TempSrc:    Oral  Resp: 12 13 17 15   Height:      Weight:    116.3 kg (256 lb 6.3 oz)  SpO2:        Wt Readings from Last 3 Encounters:  02/25/14 116.3 kg (256 lb 6.3 oz)  02/14/14 118.842 kg (262 lb)  02/11/14 118.842 kg (262 lb)     Intake/Output Summary (Last 24 hours) at 02/25/14 1339 Last data filed at 02/25/14 1134  Gross per 24 hour  Intake    243 ml  Output   3000 ml  Net  -2757 ml     Physical Exam  Chest: Clear Bilaterally Heart : S1S2 RRR Abdomen: Soft, nontender.  Ext : No edema Skin; Erythematous rash noted in the lower abdominal wall Neuro: Alert, oriented x 3   Data Review   Micro Results Recent Results (from the past 240 hour(s))  Blood culture (routine x 2)     Status: None (Preliminary result)   Collection Time: 02/23/14 11:58 PM  Result Value Ref Range Status   Specimen Description BLOOD LEFT ARM  Final   Special Requests BOTTLES DRAWN AEROBIC AND ANAEROBIC 10CC EA  Final   Culture  Setup Time   Final    02/24/2014 03:10 Performed at [facility]    Culture   Final           BLOOD CULTURE RECEIVED NO GROWTH TO DATE CULTURE WILL BE HELD FOR 5 DAYS BEFORE ISSUING  A FINAL NEGATIVE REPORT Performed at [facility]    Report Status PENDING  Incomplete  Blood culture (routine x 2)     Status: None (Preliminary result)   Collection Time: 02/24/14 12:05 AM  Result Value Ref Range Status   Specimen Description BLOOD LEFT ARM  Final   Special Requests BOTTLES DRAWN AEROBIC ONLY 10CCS  Final   Culture  Setup Time  Final    02/24/2014 03:10 Performed at [facility]    Culture   Final           BLOOD CULTURE RECEIVED NO GROWTH TO DATE CULTURE WILL BE HELD FOR 5 DAYS BEFORE ISSUING A FINAL NEGATIVE REPORT Performed at [facility]    Report Status PENDING  Incomplete  Urine culture     Status: None (Preliminary result)   Collection Time: 02/24/14 11:05 AM  Result Value Ref Range Status   Specimen Description URINE, RANDOM  Final   Special Requests NONE  Final   Culture  Setup Time   Final    02/24/2014 19:38 Performed at [place]   Final    75,000 COLONIES/ML Performed at [facility]    Culture   Final    ESCHERICHIA COLI Performed at [facility]    Report Status PENDING  Incomplete    Radiology Reports Mr Pelvis Wo Contrast  02/24/2014   CLINICAL DATA:  Abdominal wall cellulitis with left groin pain.  EXAM: MRI PELVIS WITHOUT CONTRAST  TECHNIQUE: Multiplanar multisequence MR imaging of the pelvis was performed. No intravenous contrast was administered.  COMPARISON:  CT scan 02/15/2014  FINDINGS: Exam is limited by body habitus (morbid obesity).  Both hips are normally located. Mild to moderate degenerative changes bilaterally but no acute bony findings. No stress fracture or AVN. No hip joint effusion. The pubic symphysis and SI joints are intact. No bony pelvic abnormality. Mild edema like signal abnormality in the medial aspect of the adductor muscles and gluteus medius muscles could be nonspecific myositis or muscle strain. The remaining pelvic musculature is grossly  intact.  No significant intrapelvic abnormalities are demonstrated. Mild bladder wall thickening.  Diffuse patchy subcutaneous soft tissue swelling/edema and fluid likely reflecting cellulitis. No focal drainable fluid collection to suggest an abscess. I do not see any significant abnormality in the left groin area.  IMPRESSION: 1. Diffuse patchy subcutaneous soft tissue swelling/edema/fluid likely reflecting cellulitis. No focal drainable fluid collection to suggest an abscess. 2. Mild nonspecific muscle edema in the gluteus medius muscles and in the adductor muscles medially. 3. No significant intrapelvic findings. 4. No significant bony findings.   Electronically Signed   By: Kalman Jewels M.D.   On: 02/24/2014 18:13    CBC  Recent Labs Lab 02/23/14 2216 02/24/14 0752 02/25/14 0741  WBC 8.8 8.1 8.0  HGB 11.6* 11.3* 10.6*  HCT 38.3 38.2 34.8*  PLT 321 313 285  MCV 98.7 98.2 99.1  MCH 29.9 29.0 30.2  MCHC 30.3 29.6* 30.5  RDW 18.5* 18.6* 18.3*  LYMPHSABS 0.9  --   --   MONOABS 0.6  --   --   EOSABS 0.2  --   --   BASOSABS 0.1  --   --     Chemistries   Recent Labs Lab 02/23/14 2216 02/24/14 0752 02/25/14 0741  NA 138 137 137  K 5.2 4.3 4.5  CL 96 97 96  CO2 27 23 24   GLUCOSE 343* 158* 149*  BUN 34* 38* 49*  CREATININE 4.66* 4.96* 5.86*  CALCIUM 9.2 9.4 8.9  AST 17 21  --   ALT 23 22  --   ALKPHOS 175* 162*  --   BILITOT 0.6 0.6  --    ------------------------------------------------------------------------------------------------------------------ estimated creatinine clearance is 16 mL/min (by C-G formula based on Cr of 5.86). ------------------------------------------------------------------------------------------------------------------ No results for input(s): HGBA1C in the last 72  hours. ------------------------------------------------------------------------------------------------------------------ No results for input(s): CHOL, HDL, LDLCALC, TRIG, CHOLHDL,  LDLDIRECT in the last 72 hours. ------------------------------------------------------------------------------------------------------------------ No results for input(s): TSH, T4TOTAL, T3FREE, THYROIDAB in the last 72 hours.  Invalid input(s): FREET3 ------------------------------------------------------------------------------------------------------------------ No results for input(s): VITAMINB12, FOLATE, FERRITIN, TIBC, IRON, RETICCTPCT in the last 72 hours.  Coagulation profile  Recent Labs Lab 02/24/14 0752  INR 1.15    No results for input(s): DDIMER in the last 72 hours.  Cardiac Enzymes  Recent Labs Lab 02/24/14 0045  TROPONINI <0.30   ------------------------------------------------------------------------------------------------------------------ Invalid input(s): POCBNP     Time Spent in minutes   25 minutes   ELGERGAWY, DAWOOD M.D on 02/25/2014 at 1:39 PM  Between 7am to 7pm - Pager - [phone]  After 7pm go to [email or web address] - password [email or web address]  And look for the night coverage person covering for me after hours  [facility] Office  [phone]   **Disclaimer: This note may have been dictated with voice recognition software. Similar sounding words can inadvertently be transcribed and this note may contain transcription errors which may not have been corrected upon publication of note.**

## 2014-02-25 NOTE — Progress Notes (Signed)
Inpatient Diabetes Program Recommendations  AACE/ADA: New Consensus Statement on Inpatient Glycemic Control (2013)  Target Ranges:  Prepandial:   less than 140 mg/dL      Peak postprandial:   less than 180 mg/dL (1-2 hours)      Critically ill patients:  140 - 180 mg/dL   Results for Hannah [name], Hannah [name] (MRN [id number]) as of 02/25/2014 10:23  Ref. Range 02/24/2014 07:17 02/24/2014 11:56 02/24/2014 18:12 02/24/2014 21:35  Glucose-Capillary Latest Range: 70-99 mg/dL 161 (H) 174 (H) 220 (H) 198 (H)   Diabetes history: DM2 Outpatient Diabetes medications: Levemir 20 units QPM, Novolog 5 units TID with meals Current orders for Inpatient glycemic control: Levemir 20 units QPM, Novolog 0-15 units AC, Novolog 0-5 units HS  Inpatient Diabetes Program Recommendations Insulin - Meal Coverage: Please consider ordering Novolog 3 units TID with meals for meal coverage.  Thanks, Barnie Alderman, RN, MSN, CCRN, CDE Diabetes Coordinator Inpatient Diabetes Program [phone] (Team Pager) [phone] ([facility] office) [phone] [facility] office)'

## 2014-02-26 ENCOUNTER — Ambulatory Visit (HOSPITAL_COMMUNITY): Payer: Medicare Other

## 2014-02-26 LAB — URINE CULTURE: Colony Count: 75000

## 2014-02-26 LAB — GLUCOSE, CAPILLARY
GLUCOSE-CAPILLARY: 157 mg/dL — AB (ref 70–99)
Glucose-Capillary: 153 mg/dL — ABNORMAL HIGH (ref 70–99)

## 2014-02-26 MED ORDER — MIDODRINE HCL 5 MG PO TABS
10.0000 mg | ORAL_TABLET | ORAL | Status: DC
  Filled 2014-02-26: qty 2

## 2014-02-26 MED ORDER — CINACALCET HCL 30 MG PO TABS: 30.0000 mg | ORAL_TABLET | Freq: Every day | ORAL | Status: AC

## 2014-02-26 MED ORDER — HYDROMORPHONE HCL 2 MG PO TABS: 2.0000 mg | ORAL_TABLET | ORAL | Status: DC | PRN

## 2014-02-26 MED ORDER — SEVELAMER CARBONATE 800 MG PO TABS: 2400.0000 mg | ORAL_TABLET | Freq: Three times a day (TID) | ORAL | Status: AC

## 2014-02-26 MED ORDER — SEVELAMER CARBONATE 800 MG PO TABS: 2400.0000 mg | ORAL_TABLET | Freq: Three times a day (TID) | ORAL | Status: DC

## 2014-02-26 MED ORDER — SEVELAMER CARBONATE 800 MG PO TABS
2400.0000 mg | ORAL_TABLET | Freq: Three times a day (TID) | ORAL | Status: DC
  Filled 2014-02-26 (×2): qty 3

## 2014-02-26 NOTE — Discharge Instructions (Addendum)
Continue dry dressings over site until healed.

## 2014-02-26 NOTE — Clinical Documentation Improvement (Signed)
"  Sepsis" and "Skin Sepsis" documented in current medical record with clinical indicators of temp 100.7 rectal on admission and tachycardia and leukocytosis per H&P.  Biopsy pending to determine if patient has calciphylaxis.  Please document if the diagnosis of Sepsis and Skin Sepsis has been:   - Ruled Out and not applicable to this admission   - Confirmed and remains applicable to this admission   - Unable to Clinically Determine  Thank You, Erling Conte ,RN Clinical Documentation Specialist:  [facility]

## 2014-02-26 NOTE — Progress Notes (Signed)
Patient ID: Hannah [name], female   DOB: [birth year], 43 y.o.   MRN: [id number]    Subjective: No new complaints  Objective: Vital signs in last 24 hours: Temp:  [97.4 F (36.3 C)-99.1 F (37.3 C)] 98.5 F (36.9 C) (11/10 0553) Pulse Rate:  [82-114] 109 (11/10 0553) Resp:  [12-19] 19 (11/10 0553) BP: (94-120)/(50-75) 115/55 mmHg (11/10 0553) SpO2:  [92 %-100 %] 100 % (11/10 0553) Weight:  [256 lb 6.3 oz (116.3 kg)] 256 lb 6.3 oz (116.3 kg) (11/10 0500) Last BM Date: 02/23/14  Intake/Output from previous day: 11/09 0701 - 11/10 0700 In: 530 [P.O.:480; IV Piggyback:50] Out: 3000  Intake/Output this shift:    PE: Abd: soft, punch biopsy site healing well.  No bleeding or evidence of infection  Lab Results:   Recent Labs  02/24/14 0752 02/25/14 0741  WBC 8.1 8.0  HGB 11.3* 10.6*  HCT 38.2 34.8*  PLT 313 285   BMET  Recent Labs  02/24/14 0752 02/25/14 0741  NA 137 137  K 4.3 4.5  CL 97 96  CO2 23 24  GLUCOSE 158* 149*  BUN 38* 49*  CREATININE 4.96* 5.86*  CALCIUM 9.4 8.9   PT/INR  Recent Labs  02/24/14 0752  LABPROT 14.8  INR 1.15   CMP     Component Value Date/Time   NA 137 02/25/2014 0741   K 4.5 02/25/2014 0741   CL 96 02/25/2014 0741   CO2 24 02/25/2014 0741   GLUCOSE 149* 02/25/2014 0741   BUN 49* 02/25/2014 0741   CREATININE 5.86* 02/25/2014 0741   CREATININE 4.67* 02/14/2014 1630   CALCIUM 8.9 02/25/2014 0741   PROT 6.5 02/24/2014 0752   ALBUMIN 2.4* 02/24/2014 0752   AST 21 02/24/2014 0752   ALT 22 02/24/2014 0752   ALKPHOS 162* 02/24/2014 0752   BILITOT 0.6 02/24/2014 0752   GFRNONAA 8* 02/25/2014 0741   GFRNONAA 11* 02/14/2014 1630   GFRAA 9* 02/25/2014 0741   GFRAA 12* 02/14/2014 1630   Lipase     Component Value Date/Time   LIPASE 12 02/03/2014 0012       Studies/Results: Mr Pelvis Wo Contrast  02/24/2014   CLINICAL DATA:  Abdominal wall cellulitis with left groin pain.  EXAM: MRI PELVIS WITHOUT CONTRAST  TECHNIQUE:  Multiplanar multisequence MR imaging of the pelvis was performed. No intravenous contrast was administered.  COMPARISON:  CT scan 02/15/2014  FINDINGS: Exam is limited by body habitus (morbid obesity).  Both hips are normally located. Mild to moderate degenerative changes bilaterally but no acute bony findings. No stress fracture or AVN. No hip joint effusion. The pubic symphysis and SI joints are intact. No bony pelvic abnormality. Mild edema like signal abnormality in the medial aspect of the adductor muscles and gluteus medius muscles could be nonspecific myositis or muscle strain. The remaining pelvic musculature is grossly intact.  No significant intrapelvic abnormalities are demonstrated. Mild bladder wall thickening.  Diffuse patchy subcutaneous soft tissue swelling/edema and fluid likely reflecting cellulitis. No focal drainable fluid collection to suggest an abscess. I do not see any significant abnormality in the left groin area.  IMPRESSION: 1. Diffuse patchy subcutaneous soft tissue swelling/edema/fluid likely reflecting cellulitis. No focal drainable fluid collection to suggest an abscess. 2. Mild nonspecific muscle edema in the gluteus medius muscles and in the adductor muscles medially. 3. No significant intrapelvic findings. 4. No significant bony findings.   Electronically Signed   By: Kalman Jewels M.D.   On: 02/24/2014 18:13  Anti-infectives: Anti-infectives    Start     Dose/Rate Route Frequency Ordered Stop   02/25/14 1200  vancomycin (VANCOCIN) IVPB 1000 mg/200 mL premix     1,000 mg200 mL/hr over 60 Minutes Intravenous Every M-W-F (Hemodialysis) 02/24/14 0159     02/24/14 0800  piperacillin-tazobactam (ZOSYN) IVPB 2.25 g     2.25 g100 mL/hr over 30 Minutes Intravenous Every 8 hours 02/24/14 0159     02/24/14 0200  vancomycin (VANCOCIN) IVPB 1000 mg/200 mL premix     1,000 mg200 mL/hr over 60 Minutes Intravenous  Once 02/24/14 0159 02/24/14 0409   02/24/14 0030   piperacillin-tazobactam (ZOSYN) IVPB 3.375 g     3.375 g100 mL/hr over 30 Minutes Intravenous  Once 02/24/14 0017 02/24/14 0043   02/24/14 0000  vancomycin (VANCOCIN) IVPB 1000 mg/200 mL premix     1,000 mg200 mL/hr over 60 Minutes Intravenous  Once 02/23/14 2349 02/24/14 0143   02/24/14 0000  piperacillin-tazobactam (ZOSYN) IVPB 3.375 g  Status:  Discontinued     3.375 g12.5 mL/hr over 240 Minutes Intravenous  Once 02/23/14 2349 02/24/14 0016       Assessment/Plan  1. Abdominal wall cellulitis, s/p punch biopsy  Plan: 1. Continue dry dressings over site until healed.  Await pathology.   2. We will sign off.   LOS: 3 days    Devanshi Califf E 02/26/2014, 10:12 AM Pager: [phone]

## 2014-02-26 NOTE — Progress Notes (Signed)
Subjective:  Sp bx yest. abd / hd yest tolerated  Objective Vital signs in last 24 hours: Filed Vitals:   02/25/14 2034 02/26/14 0258 02/26/14 0500 02/26/14 0553  BP: 105/52 111/56  115/55  Pulse: 100 82  109  Temp: 99.1 F (37.3 C)   98.5 F (36.9 C)  TempSrc:      Resp: 18   19  Height:      Weight:   116.3 kg (256 lb 6.3 oz)   SpO2: 92%   100%   Weight change: 2.926 kg (6 lb 7.2 oz)  Physical Exam: General:  alert , nad, OX3  appropriate  Heart: RRR, soft 1/6 sem LSB, no rub or gallop Lungs: CTA bilat , unlabored breathing Abdomen:Obese, bs pos, soft ,Tender Left lower quad with hard nodular Red areas in Left lower region slightly improved from admit Extremities: bilat 1 + bipedal edema Dialysis Access: L IJ P. cath  Dialysis Orders: Center: [facility] on [facility] . EDW 115.5kg HD Bath 2.0ca, 2.0 k Time 4.0 hrs Heparin 5,000. Access L IJ permcath ( had vein mapping Feb 21, 2014 [facility]) BFR 400 DFR af 2.0  Hec. 5 mcg IV/HD aranesp 100 Units IV/HD weekly Venofer 100mg  iv qhd loading stop 03/04/14 then 50mg  weekly hd  Other hgb 10.5 last op lab and 17% tfs/ ca 10.0 / phos 6.4 with ipth 265   Assessment/Plan 1. 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 bx in hosp needed as this is her 3rd admit / started on Sodium thiosulfate 2. ESRD - HD On Schedule MWF fu labs pre hd/ has fu with [facility] for perm Access Nov 23rd 3. Hypertension/volume - bp 100/60 On midodrine /close to edw/ currently ~~1 kg >edw and needs lower edw with pedal edema. 4. Anemia - hgb 10.6 was on ESA 19mcg As op and weekly fe / fu hgb in hosp .  5. Metabolic bone disease - Continue Hect 41mcg q hd And phos binder phoslo fu ca / phos= 7.2 , incr Renvela 3 dw her importance of binder compliance  and diet 6. DM Type 2 - per admit 7. ASCVD-  Carotid disease  followed by Dr. Trula Slade closely/ Has Fu ov  Apt. Nov Per admit  Ernest Haber, PA-C [place] [phone] 02/26/2014,9:09 AM  LOS: 3 days  I have seen and examined this patient and agree with plan per Ernest Haber.  SP abd wall bx yest, results pending.  HD today on lower Ca bath to try to produce neg ca balance.  Will increase midodrine pre HD. Vivek Grealish T,MD 02/26/2014 10:17 AM Labs: Basic Metabolic Panel:  Recent Labs Lab 02/23/14 2216 02/24/14 0752 02/25/14 0741 02/25/14 0930  NA 138 137 137  --   K 5.2 4.3 4.5  --   CL 96 97 96  --   CO2 27 23 24   --   GLUCOSE 343* 158* 149*  --   BUN 34* 38* 49*  --   CREATININE 4.66* 4.96* 5.86*  --   CALCIUM 9.2 9.4 8.9  --   PHOS  --   --   --  7.2*   Liver Function Tests:  Recent Labs Lab 02/23/14 2216 02/24/14 0752  AST 17 21  ALT 23 22  ALKPHOS 175* 162*  BILITOT 0.6 0.6  PROT 6.3 6.5  ALBUMIN 2.3* 2.4*  No results for input(s): LIPASE, AMYLASE in the last 168 hours. No results for input(s): AMMONIA in the last 168 hours. CBC:  Recent Labs Lab 02/23/14 2216 02/24/14 0752 02/25/14 0741  WBC 8.8 8.1 8.0  NEUTROABS 7.1  --   --   HGB 11.6* 11.3* 10.6*  HCT 38.3 38.2 34.8*  MCV 98.7 98.2 99.1  PLT 321 313 285   Cardiac Enzymes:  Recent Labs Lab 02/24/14 0045  TROPONINI <0.30   CBG:  Recent Labs Lab 02/24/14 2135 02/25/14 1204 02/25/14 1637 02/25/14 2032 02/26/14 0720  GLUCAP 198* 86 279* 293* 153*    Studies/Results: Mr Pelvis Wo Contrast  02/24/2014   CLINICAL DATA:  Abdominal wall cellulitis with left groin pain.  EXAM: MRI PELVIS WITHOUT CONTRAST  TECHNIQUE: Multiplanar multisequence MR imaging of the pelvis was performed. No intravenous contrast was administered.  COMPARISON:  CT scan 02/15/2014  FINDINGS: Exam is limited by body habitus (morbid obesity).  Both hips are normally located. Mild to moderate degenerative changes bilaterally but no acute bony  findings. No stress fracture or AVN. No hip joint effusion. The pubic symphysis and SI joints are intact. No bony pelvic abnormality. Mild edema like signal abnormality in the medial aspect of the adductor muscles and gluteus medius muscles could be nonspecific myositis or muscle strain. The remaining pelvic musculature is grossly intact.  No significant intrapelvic abnormalities are demonstrated. Mild bladder wall thickening.  Diffuse patchy subcutaneous soft tissue swelling/edema and fluid likely reflecting cellulitis. No focal drainable fluid collection to suggest an abscess. I do not see any significant abnormality in the left groin area.  IMPRESSION: 1. Diffuse patchy subcutaneous soft tissue swelling/edema/fluid likely reflecting cellulitis. No focal drainable fluid collection to suggest an abscess. 2. Mild nonspecific muscle edema in the gluteus medius muscles and in the adductor muscles medially. 3. No significant intrapelvic findings. 4. No significant bony findings.   Electronically Signed   By: Kalman Jewels M.D.   On: 02/24/2014 18:13   Medications:   . aspirin EC  81 mg Oral Daily  . cinacalcet  30 mg Oral Q breakfast  . darbepoetin (ARANESP) injection - DIALYSIS  60 mcg Intravenous Q Mon-HD  . heparin  5,000 Units Subcutaneous 3 times per day  . insulin aspart  0-15 Units Subcutaneous TID WC  . insulin aspart  0-5 Units Subcutaneous QHS  . insulin aspart  3 Units Subcutaneous TID WC  . insulin detemir  20 Units Subcutaneous QPM  . lidocaine (PF)  5 mL Intradermal Once  . loratadine  10 mg Oral Daily  . midodrine  5 mg Oral Once per day on Mon Wed Fri  . multivitamin  1 tablet Oral QHS  . pantoprazole  40 mg Oral Daily  . piperacillin-tazobactam (ZOSYN)  IV  2.25 g Intravenous Q8H  . sevelamer carbonate  1,600 mg Oral TID WC  . sodium chloride  3 mL Intravenous Q12H  . sodium thiosulfate infusion for calciphylaxis  25 g Intravenous Q M,W,F-HD  . ursodiol  300 mg Oral BID  .  vancomycin  1,000 mg Intravenous Q M,W,F-HD

## 2014-02-26 NOTE — Discharge Summary (Signed)
Physician Discharge Summary  Hannah [name] GGY:[id number] DOB: 02-Dec-1970 DOA: 02/23/2014  PCP: Vic Blackbird, MD  Admit date: 02/23/2014 Discharge date: 02/26/2014  Time spent: *50 minutes  Recommendations for Outpatient Follow-up:  1. *Follow up PCP in 2 weeks  Discharge Diagnoses:  Principal Problem:   Sepsis affecting skin Active Problems:   Diabetic retinopathy associated with type 2 diabetes mellitus   CAD- severe 3V CAD May 2015- turned down for CABG   Cerebrovascular disease   GERD (gastroesophageal reflux disease)   ESRD (end stage renal disease) on dialysis   Anemia of chronic disease   Cardiomyopathy, ischemic- EF 25-30% July 2015   Abdominal wall cellulitis   Discharge Condition: *Stable  Diet recommendation: *Low salt diet  Filed Weights   02/25/14 0724 02/25/14 1134 02/26/14 0500  Weight: 119.5 kg (263 lb 7.2 oz) 116.3 kg (256 lb 6.3 oz) 116.3 kg (256 lb 6.3 oz)    History of present illness:  43 y.o. female with Past medical history of diabetic retinopathy, ESRD, diabetes, CHF, CAD, GERD, hypertension, morbid obesity. The patient is presenting with complaints of redness and pain of the abdomen. she mentions that she has been having dizziness area of pain 2 months ago and has been treated with antibiotics and following up with her primary care doctor as an outpatient. She had a CT scan done on 103015, which showed persistent cellulitis and was recommended for further workup with an MRI as well as a biopsy. Yesterday the patient mentions that she has noted that there was further worsening of the redness which was spreading towards her back on the left as well as had some fever and worsening of the pain and therefore she decided to come to the hospital. She denies any nausea or vomiting chills diarrhea or constipation or focal deficit. She denies any recent change in her medication other than increasing the pain medications. She also has multiple scratch on the  back as well as on the legs which she Mentions is due to scratching and itching. She has swelling and redness of both legs. She mentions her swelling is chronic and the redness is on and off since last many months. She has a tunneled dialysis catheter.  Hospital Course:   Abdominal wall cellulitis -Patient  Was started on on broad-spectrum IV antibiotics, afebrile with no leukocytosis, blood culture showing no growth to date, lunch biopsy obtained 11/9 to rule out calciphylaxis.the biopsy results are pending at this time. Will be followed by nephrology as outpatient.We'll discontinue antibiotics, as does not appear infectious.  End-stage renal disease -On hemodialysis as outpatient  Coronary artery disease -Denies chest pain or shortness of breath -Continue with aspirin  Anemia -Anemia of chronic kidney disease -Continue with Aranesp  Diabetes mellitus - continue Levemir 20 units at bedtime along with NovoLog 5 units subcutaneous 3 times a day   Procedures:  Skin punch biopsy  Consultations:  surgery  Discharge Exam: Filed Vitals:   02/26/14 0935  BP: 103/43  Pulse: 105  Temp: 97.9 F (36.6 C)  Resp: 20    General: *appear in no acute distress Cardiovascular: S1-S2 regular Respiratory: clear bilaterally  Discharge Instructions You were cared for by a hospitalist during your hospital stay. If you have any questions about your discharge medications or the care you received while you were in the hospital after you are discharged, you can call the unit and asked to speak with the hospitalist on call if the hospitalist that took care of you is  not available. Once you are discharged, your primary care physician will handle any further medical issues. Please note that NO REFILLS for any discharge medications will be authorized once you are discharged, as it is imperative that you return to your primary care physician (or establish a relationship with a primary care physician  if you do not have one) for your aftercare needs so that they can reassess your need for medications and monitor your lab values.  Discharge Instructions    Diet - low sodium heart healthy    Complete by:  As directed      Increase activity slowly    Complete by:  As directed           Current Discharge Medication List    CONTINUE these medications which have NOT CHANGED   Details  aspirin EC 81 MG EC tablet Take 1 tablet (81 mg total) by mouth daily. Qty: 30 tablet, Refills: 0    atorvastatin (LIPITOR) 80 MG tablet Take 1 tablet (80 mg total) by mouth daily at 6 PM. Qty: 30 tablet, Refills: 6    calcium acetate (PHOSLO) 667 MG capsule Take 2 capsules (1,334 mg total) by mouth 3 (three) times daily with meals. Qty: 90 capsule, Refills: 0    cyclobenzaprine (FLEXERIL) 10 MG tablet Take 10 mg by mouth 3 (three) times daily as needed for muscle spasms.    Docusate Calcium (STOOL SOFTENER PO) Take 1-3 capsules by mouth daily as needed (constipation).    HYDROmorphone (DILAUDID) 2 MG tablet Take 1-2 tablets (2-4 mg total) by mouth every 4 (four) hours as needed for severe pain. Qty: 30 tablet, Refills: 0    insulin aspart (NOVOLOG) 100 UNIT/ML FlexPen 5 Units, Subcutaneous, 3 times daily with meals Qty: 15 mL, Refills: 11    Insulin Detemir (LEVEMIR FLEXPEN) 100 UNIT/ML Pen Inject 20 Units into the skin every evening. Qty: 15 mL, Refills: 11    methylcellulose (ARTIFICIAL TEARS) 1 % ophthalmic solution Place 1 drop into both eyes at bedtime as needed (dry eyes).    midodrine (PROAMATINE) 10 MG tablet Take 5 mg by mouth 3 (three) times a week. Monday, Wednesday and Friday    multivitamin (RENA-VIT) TABS tablet Take 1 tablet by mouth daily.    pantoprazole (PROTONIX) 40 MG tablet Take 1 tablet (40 mg total) by mouth daily. Qty: 30 tablet, Refills: 6    polyethylene glycol (MIRALAX / GLYCOLAX) packet Take 17 g by mouth daily as needed (constipation). Qty: 14 each, Refills: 6     promethazine (PHENERGAN) 25 MG tablet Take 1 tablet (25 mg total) by mouth every 8 (eight) hours as needed for nausea or vomiting. Qty: 20 tablet, Refills: 2    triamcinolone cream (KENALOG) 0.1 % Apply 1 application topically 2 (two) times daily. Qty: 45 g, Refills: 1    ursodiol (ACTIGALL) 300 MG capsule Take 1 capsule (300 mg total) by mouth 2 (two) times daily. Qty: 60 capsule, Refills: 4       Allergies  Allergen Reactions  . Crestor [Rosuvastatin] Other (See Comments)    Severe muscle weakness  . Nsaids Other (See Comments)    Not allergic, "bad on my kidneys"  . Ciprofloxacin Rash   Follow-up Information    Follow up with Vic Blackbird, MD In 2 weeks.   Specialty:  Family Medicine   Contact information:   [address] [place]  [address] [phone]        The results of significant  diagnostics from this hospitalization (including imaging, microbiology, ancillary and laboratory) are listed below for reference.    Significant Diagnostic Studies: Ct Abdomen Pelvis Wo Contrast  02/15/2014   CLINICAL DATA:  Left lower quadrant abdominal pain.  Dialysis.  EXAM: CT ABDOMEN AND PELVIS WITHOUT CONTRAST  TECHNIQUE: Multidetector CT imaging of the abdomen and pelvis was performed following the standard protocol without IV contrast.  COMPARISON:  01/29/2014  FINDINGS: Lower chest: The previously hypermetabolic left lower lobe pulmonary nodule measures 1.5 by 1.2 cm on image 6 of series 6, with slightly spiculated margins. Small stable subpleural nodules along the right lower lobe. Small left pleural effusion is not specific for transudative or exudative etiology. Prior right pleural effusion has resolved over the past 2 weeks.  Coronary artery atherosclerosis noted with mild cardiomegaly.  Hepatobiliary: Unremarkable.  Pancreas: Atrophic for age  Spleen: Unremarkable  Adrenals/Urinary Tract: Faint calcification posteriorly along the right adrenal gland. No adrenal mass.  Faint calcification along a probable internal septum in the 5.2 by 4.3 cm exophytic hypodense lesion of the left mid kidney. This lesion appeared photopenic on the 2013 PET-CT.  Stomach/Bowel: Prominent stool throughout the colon favors constipation.  Vascular/Lymphatic: Progressive porta hepatis, gastrohepatic ligament, and retroperitoneal lymph node enlargement. A aortocaval lymph node on image 36 of series 2 measures 1.2 cm in short axis. Aortoiliac atherosclerotic vascular disease.  Reproductive: Unremarkable  Other: Continued stranding in the right groin region overlying the right common femoral vasculature. Adjacent lymph nodes observed without new hematoma. Diffuse subcutaneous stranding, mildly increased from prior.  Musculoskeletal: Unremarkable  IMPRESSION: 1. 1.5 by 1.2 cm left lower lobe pulmonary nodule. This lesion measured 2.4 by 2.2 cm back on 06/09/2011 ; had shrunk down to 0.4 cm back on 08/02/2012 ; and has now enlarged to the current measurement noted above. Accordingly, recurrence of malignancy is a concern. There are small subpleural nodules in the right lower lobe which appear stable from 2014. 2. Progressive and somewhat generalized porta hepatis, gastrohepatic ligament, and retroperitoneal lymph nodal enlargement. An aortocaval lymph node is mildly enlarged at 1.2 cm in short axis. 3. No change in the right groin region subcutaneous stranding overlying the right common femoral vasculature. Appearance likely postinflammatory. 4.  Prominent stool throughout the colon favors constipation. 5.  Aortoiliac atherosclerotic vascular disease. 6. Persistent small left pleural effusion. Resolution of the prior right pleural effusion. 7. Mild cardiomegaly with coronary artery atherosclerosis.   Electronically Signed   By: Sherryl Barters M.D.   On: 02/15/2014 10:06   Mr Pelvis Wo Contrast  02/24/2014   CLINICAL DATA:  Abdominal wall cellulitis with left groin pain.  EXAM: MRI PELVIS WITHOUT CONTRAST   TECHNIQUE: Multiplanar multisequence MR imaging of the pelvis was performed. No intravenous contrast was administered.  COMPARISON:  CT scan 02/15/2014  FINDINGS: Exam is limited by body habitus (morbid obesity).  Both hips are normally located. Mild to moderate degenerative changes bilaterally but no acute bony findings. No stress fracture or AVN. No hip joint effusion. The pubic symphysis and SI joints are intact. No bony pelvic abnormality. Mild edema like signal abnormality in the medial aspect of the adductor muscles and gluteus medius muscles could be nonspecific myositis or muscle strain. The remaining pelvic musculature is grossly intact.  No significant intrapelvic abnormalities are demonstrated. Mild bladder wall thickening.  Diffuse patchy subcutaneous soft tissue swelling/edema and fluid likely reflecting cellulitis. No focal drainable fluid collection to suggest an abscess. I do not see any significant abnormality in the  left groin area.  IMPRESSION: 1. Diffuse patchy subcutaneous soft tissue swelling/edema/fluid likely reflecting cellulitis. No focal drainable fluid collection to suggest an abscess. 2. Mild nonspecific muscle edema in the gluteus medius muscles and in the adductor muscles medially. 3. No significant intrapelvic findings. 4. No significant bony findings.   Electronically Signed   By: Kalman Jewels M.D.   On: 02/24/2014 18:13   Ct Abdomen Pelvis W Contrast  01/29/2014   CLINICAL DATA:  Lower quadrant abdominal pain.  Initial encounter.  EXAM: CT ABDOMEN AND PELVIS WITH CONTRAST  TECHNIQUE: Multidetector CT imaging of the abdomen and pelvis was performed using the standard protocol following bolus administration of intravenous contrast.  CONTRAST:  127mL OMNIPAQUE IOHEXOL 300 MG/ML  SOLN  COMPARISON:  11/25/2013  FINDINGS: BODY WALL: At the site of soft tissue defect in the right lower quadrant/right groin, there is increased subcutaneous reticulation correlating with history of  the erythema. Soft tissue density at the base of the defect has also increased, presumably scarring.No evidence of bowel fistulization or abscess.  LOWER CHEST: 12 mm nodule in the left lower lobe is unchanged compared recent imaging. This is likely the patient's treated lung cancer. Chronic, small bilateral pleural effusions.  ABDOMEN/PELVIS:  Liver: No focal abnormality.  Biliary: Cholelithiasis.  No definitive cholecystitis.  Pancreas: Unremarkable.  Spleen: Unremarkable.  Adrenals: Unremarkable.  Kidneys and ureters: 5.4 cm cyst/s exophytic from the interpolar left kidney are not significantly changed. There is fine mural calcification best visualized coronally.  Bladder: Unremarkable.  Reproductive: Unremarkable.  Bowel: No obstruction. Negative appendix.  Retroperitoneum: No mass or adenopathy.  Peritoneum: No ascites or pneumoperitoneum.  Vascular: Fusiform enlargement of the right common femoral artery to 15 mm. Diffuse atherosclerosis.  OSSEOUS: No acute abnormalities.  IMPRESSION: 1. Inflammatory changes around the right lower quadrant scar could reflect cellulitis. No soft tissue abscess. 2. Cholelithiasis. 3. 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 4. Chronic bilateral pleural effusions   Electronically Signed   By: Jorje Guild M.D.   On: 01/29/2014 03:55    Microbiology: Recent Results (from the past 240 hour(s))  Blood culture (routine x 2)     Status: None (Preliminary result)   Collection Time: 02/23/14 11:58 PM  Result Value Ref Range Status   Specimen Description BLOOD LEFT ARM  Final   Special Requests BOTTLES DRAWN AEROBIC AND ANAEROBIC 10CC EA  Final   Culture  Setup Time   Final    02/24/2014 03:10 Performed at [facility]    Culture   Final           BLOOD CULTURE RECEIVED NO GROWTH TO DATE CULTURE WILL BE HELD FOR 5 DAYS BEFORE ISSUING A FINAL NEGATIVE REPORT Performed at [facility]    Report Status PENDING  Incomplete   Blood culture (routine x 2)     Status: None (Preliminary result)   Collection Time: 02/24/14 12:05 AM  Result Value Ref Range Status   Specimen Description BLOOD LEFT ARM  Final   Special Requests BOTTLES DRAWN AEROBIC ONLY 10CCS  Final   Culture  Setup Time   Final    02/24/2014 03:10 Performed at [facility]    Culture   Final           BLOOD CULTURE RECEIVED NO GROWTH TO DATE CULTURE WILL BE HELD FOR 5 DAYS BEFORE ISSUING A FINAL NEGATIVE REPORT Performed at [facility]    Report Status PENDING  Incomplete  Urine culture     Status: None (Preliminary result)   Collection Time: 02/24/14 11:05 AM  Result Value Ref Range Status   Specimen Description URINE, RANDOM  Final   Special Requests NONE  Final   Culture  Setup Time   Final    02/24/2014 19:38 Performed at [place]   Final    75,000 COLONIES/ML Performed at [facility]    Culture   Final    ESCHERICHIA COLI Performed at [facility]    Report Status PENDING  Incomplete     Labs: Basic Metabolic Panel:  Recent Labs Lab 02/23/14 2216 02/24/14 0752 02/25/14 0741 02/25/14 0930  NA 138 137 137  --   K 5.2 4.3 4.5  --   CL 96 97 96  --   CO2 27 23 24   --   GLUCOSE 343* 158* 149*  --   BUN 34* 38* 49*  --   CREATININE 4.66* 4.96* 5.86*  --   CALCIUM 9.2 9.4 8.9  --   PHOS  --   --   --  7.2*   Liver Function Tests:  Recent Labs Lab 02/23/14 2216 02/24/14 0752  AST 17 21  ALT 23 22  ALKPHOS 175* 162*  BILITOT 0.6 0.6  PROT 6.3 6.5  ALBUMIN 2.3* 2.4*   No results for input(s): LIPASE, AMYLASE in the last 168 hours. No results for input(s): AMMONIA in the last 168 hours. CBC:  Recent Labs Lab 02/23/14 2216 02/24/14 0752 02/25/14 0741  WBC 8.8 8.1 8.0  NEUTROABS 7.1  --   --   HGB 11.6* 11.3* 10.6*  HCT 38.3 38.2 34.8*  MCV 98.7 98.2 99.1  PLT 321 313 285   Cardiac Enzymes:  Recent Labs Lab 02/24/14 0045  TROPONINI  <0.30   BNP: BNP (last 3 results)  Recent Labs  08/13/13 1949 09/06/13 0740  PROBNP 34187.0* 28622.0*   CBG:  Recent Labs Lab 02/25/14 1204 02/25/14 1637 02/25/14 2032 02/26/14 0720 02/26/14 1214  GLUCAP 86 279* 293* 153* 157*       Signed:  Kaycee Haycraft S  [facility] 02/26/2014, 2:56 PM

## 2014-02-26 NOTE — Progress Notes (Signed)
Discharge instructions and medications discussed with patient.  Prescriptions given to patient.  All questions answered.  

## 2014-02-27 NOTE — Care Management Note (Signed)
Late Entry:  CARE MANAGEMENT NOTE 02/27/2014  Patient:  Hannah [name], Hannah [name]   Account Number:  [id number]  Date Initiated:  02/26/2014  Documentation initiated by:  Wylene Weissman  Subjective/Objective Assessment:   CM following for progression and d/c planning     Action/Plan:   Request for orders for [facility], as pt was active with [facility] prior to adm however services were completed while the pt was in the hospital. Dr Vernell Barrier has written new [place] orders.   Anticipated DC Date:  02/26/2014   Anticipated DC Plan:  [place]         Choice offered to / List presented to:          [facility] arranged  HH-1 RN      [place].   Status of service:   Medicare Important Message given?   (If response is "NO", the following Medicare IM given date fields will be blank) Date Medicare IM given:   Medicare IM given by:   Date Additional Medicare IM given:   Additional Medicare IM given by:    Discharge Disposition:  [place]  Per UR Regulation:    If discussed at Long Length of Stay Meetings, dates discussed:    Comments:

## 2014-03-01 ENCOUNTER — Telehealth: Payer: Self-pay | Admitting: Family Medicine

## 2014-03-01 NOTE — Telephone Encounter (Signed)
[phone]   Pt is needing a refill on HYDROmorphone (DILAUDID) 2 MG tablet

## 2014-03-02 LAB — CULTURE, BLOOD (ROUTINE X 2)
CULTURE: NO GROWTH
Culture: NO GROWTH

## 2014-03-03 ENCOUNTER — Encounter (HOSPITAL_COMMUNITY): Payer: Self-pay | Admitting: Cardiology

## 2014-03-03 ENCOUNTER — Emergency Department (HOSPITAL_COMMUNITY)
Admission: EM | Admit: 2014-03-03 | Discharge: 2014-03-03 | Disposition: A | Discharge status: Home / Self Care | Payer: Medicare Other | Attending: Emergency Medicine | Admitting: Emergency Medicine

## 2014-03-03 DIAGNOSIS — Z760 Encounter for issue of repeat prescription: Secondary | ICD-10-CM | POA: Diagnosis not present

## 2014-03-03 DIAGNOSIS — I2510 Atherosclerotic heart disease of native coronary artery without angina pectoris: Secondary | ICD-10-CM | POA: Diagnosis not present

## 2014-03-03 DIAGNOSIS — Z794 Long term (current) use of insulin: Secondary | ICD-10-CM | POA: Diagnosis not present

## 2014-03-03 DIAGNOSIS — E780 Pure hypercholesterolemia: Secondary | ICD-10-CM | POA: Diagnosis not present

## 2014-03-03 DIAGNOSIS — Z79899 Other long term (current) drug therapy: Secondary | ICD-10-CM | POA: Insufficient documentation

## 2014-03-03 DIAGNOSIS — E11319 Type 2 diabetes mellitus with unspecified diabetic retinopathy without macular edema: Secondary | ICD-10-CM | POA: Diagnosis not present

## 2014-03-03 DIAGNOSIS — Z7982 Long term (current) use of aspirin: Secondary | ICD-10-CM | POA: Insufficient documentation

## 2014-03-03 DIAGNOSIS — Z87891 Personal history of nicotine dependence: Secondary | ICD-10-CM | POA: Diagnosis not present

## 2014-03-03 DIAGNOSIS — Z872 Personal history of diseases of the skin and subcutaneous tissue: Secondary | ICD-10-CM | POA: Insufficient documentation

## 2014-03-03 DIAGNOSIS — R109 Unspecified abdominal pain: Secondary | ICD-10-CM

## 2014-03-03 DIAGNOSIS — K219 Gastro-esophageal reflux disease without esophagitis: Secondary | ICD-10-CM | POA: Diagnosis not present

## 2014-03-03 DIAGNOSIS — I509 Heart failure, unspecified: Secondary | ICD-10-CM | POA: Insufficient documentation

## 2014-03-03 DIAGNOSIS — G8929 Other chronic pain: Secondary | ICD-10-CM | POA: Diagnosis not present

## 2014-03-03 DIAGNOSIS — N185 Chronic kidney disease, stage 5: Secondary | ICD-10-CM | POA: Diagnosis not present

## 2014-03-03 DIAGNOSIS — Z85118 Personal history of other malignant neoplasm of bronchus and lung: Secondary | ICD-10-CM | POA: Diagnosis not present

## 2014-03-03 DIAGNOSIS — I252 Old myocardial infarction: Secondary | ICD-10-CM | POA: Diagnosis not present

## 2014-03-03 DIAGNOSIS — R1033 Periumbilical pain: Secondary | ICD-10-CM | POA: Insufficient documentation

## 2014-03-03 DIAGNOSIS — I120 Hypertensive chronic kidney disease with stage 5 chronic kidney disease or end stage renal disease: Secondary | ICD-10-CM | POA: Insufficient documentation

## 2014-03-03 MED ORDER — HYDROMORPHONE HCL 4 MG PO TABS: 4.0000 mg | ORAL_TABLET | ORAL | Status: DC | PRN

## 2014-03-03 MED ORDER — HYDROMORPHONE HCL 2 MG PO TABS
4.0000 mg | ORAL_TABLET | Freq: Once | ORAL | Status: AC
  Administered 2014-03-03: 4 mg via ORAL
  Filled 2014-03-03: qty 2

## 2014-03-03 NOTE — ED Notes (Signed)
LLQ abdominal pain since yesterday.  Is being treated for calcium deposits by dr. Mercy Moore.  States she is out of her pain med that she normally  Takes.

## 2014-03-03 NOTE — Discharge Instructions (Signed)
Follow up with your md this week. °

## 2014-03-03 NOTE — ED Provider Notes (Signed)
CSN: [id number]     Arrival date & time 03/03/14  1004 History  This chart was scribed for Hannah Diego, MD by Tula Nakayama, ED Scribe. This patient was seen in room APA19/APA19 and the patient's care was started at 12:19 PM.    Chief Complaint  Patient presents with  . Abdominal Pain  . Medication Refill   Patient is a 43 y.o. female presenting with abdominal pain. The history is provided by the patient. No language interpreter was used.  Abdominal Pain Pain location:  Periumbilical and suprapubic Pain radiates to:  Does not radiate Pain severity:  Mild Onset quality:  Gradual Duration:  1 week Timing:  Constant Progression:  Unchanged Chronicity:  Chronic Context comment:  Calciphylaxis Associated symptoms: no chest pain, no cough, no diarrhea, no fatigue and no hematuria     HPI Comments: Hannah [name] is a 43 y.o. female who presents to the Emergency Department complaining of chronic abdominal pain associated with calciphylaxis. Pt notes calcium deposits in the skin of lower abdomen associated with dialysis treatment. She states she needs a medication refill of Dilaudid which she has been taking for one week. Pt takes 4 mg of Dilaudid every 4 hours.  PCP is [facility]   Past Medical History  Diagnosis Date  . High cholesterol   . Diabetic retinopathy   . Peripheral neuropathy     "tips of toes"  . Blind right eye   . CHF (congestive heart failure)   . CAD (coronary artery disease)   . GERD (gastroesophageal reflux disease)   . Hypertension   . History of lung cancer 07/2011    s/p left lower lobectomy  . Diabetes mellitus     IDDM  . Cataract of right eye   . Ulcer of toe of right foot 07/10/2012    great toe  . Breast calcification, left 06/2012  . Acute biphenotypic leukemia   . CKD (chronic kidney disease) stage 5, GFR less than 15 ml/min   . Nephrotic syndrome   . Gastritis     H/o gastritis on prior endoscopy  . Anemia   . Carotid artery disease   .  Gallstones   . Myocardial infarction 5/16   Past Surgical History  Procedure Laterality Date  . Cardiac catheterization  07/16/2011  . Incision and drainage breast abscess Left   . Tubal ligation  1994  . Vitrectomy  2010    2 on left, 1 on right  . Cesarean section  1991; 1994  . Video assisted thoracoscopy (vats)/ lobectomy Left 07/30/2011    left main thoracotomy, left lower lobectomy, mediastinal lymph node dissection  . Lobectomy    . Colonoscopy with esophagogastroduodenoscopy (egd) N/A 08/14/2012    Procedure: COLONOSCOPY WITH ESOPHAGOGASTRODUODENOSCOPY (EGD);  Surgeon: Danie Binder, MD;  [facility];  Service: Endoscopy;  Laterality: N/A;  10:45-moved to [address] to notify pt  . Breast lumpectomy with needle localization Left 11/14/2012    Procedure: BREAST LUMPECTOMY WITH NEEDLE LOCALIZATION;  Surgeon: Marcello Moores A. Cornett, MD;  Location: [place];  Service: General;  Laterality: Left;  . Insertion of dialysis catheter Left 09/12/2013    Procedure: INSERTION OF DIALYSIS CATHETER;  Surgeon: Mal Misty, MD;  Location: [place];  Service: Vascular;  Laterality: Left;  . Embolectomy Right 09/17/2013    Procedure: Thrombectomy of Right Common Femoral Artery;  Surgeon: Elam Dutch, MD;  Location: [facility] OR;  Service: Vascular;  Laterality: Right;  . Endarterectomy  femoral Right 09/17/2013    Procedure: Right Femoral Endarterectomy;  Surgeon: Elam Dutch, MD;  Location: [facility] OR;  Service: Vascular;  Laterality: Right;  . Patch angioplasty Right 09/17/2013    Procedure: Vein Patch Angioplasty of Right Femoral Artery;  Surgeon: Elam Dutch, MD;  Location: [place];  Service: Vascular;  Laterality: Right;  . Fasciotomy Right 09/17/2013    Procedure: Four Compartment Fasciotomy;  Surgeon: Elam Dutch, MD;  Location: [place];  Service: Vascular;  Laterality: Right;  . Fasciotomy closure Right 09/19/2013    Procedure: FASCIOTOMY CLOSURE;  Surgeon: Elam Dutch, MD;  Location: [place];  Service: Vascular;  Laterality: Right;  regional block and monitored anesthesia care used  . I&d extremity Right 09/28/2013    Procedure: IRRIGATION AND DEBRIDEMENT EXTREMITY;  Surgeon: Serafina Mitchell, MD;  Location: [place];  Service: Vascular;  Laterality: Right;  . Application of wound vac Right 09/28/2013    Procedure: APPLICATION OF WOUND VAC;  Surgeon: Serafina Mitchell, MD;  Location: [place];  Service: Vascular;  Laterality: Right;  . Av fistula placement Right 01/03/2014    Procedure: ARTERIOVENOUS (AV) FISTULA CREATION;  Surgeon: Angelia Mould, MD;  Location: [place];  Service: Vascular;  Laterality: Right;  . Ligation of arteriovenous  fistula Right 01/04/2014    Procedure: LIGATION OF ARTERIOVENOUS  FISTULA;  Surgeon: Rosetta Posner, MD;  Location: [facility] OR;  Service: Vascular;  Laterality: Right;   Family History  Problem Relation Age of Onset  . Coronary artery disease Father   . Asthma Father   . COPD Father   . Hypertension Father   . Hyperlipidemia Father   . Diabetes Father   . Congestive Heart Failure Father   . Heart disease Father     before age 86  . Heart attack Father   . Peripheral vascular disease Father   . Hypertension Mother   . Hyperlipidemia Mother   . Diabetes Mother   . Cancer Mother   . Cancer Maternal Aunt      three aunts, bone, breast, ?  . Hypertension Brother   . Diabetes Brother   . Diabetes Sister   . Colon cancer Neg Hx   . Celiac disease Neg Hx   . Crohn's disease Neg Hx   . Ulcerative colitis Neg Hx   . Lung cancer Maternal Grandmother    History  Substance Use Topics  . Smoking status: Former Smoker -- 2.00 packs/day for 30 years    Quit date: 08/01/2011  . Smokeless tobacco: Never Used  . Alcohol Use: No   OB History    No data available     Review of Systems  Constitutional: Negative for appetite change and fatigue.  HENT: Negative for congestion, ear discharge and sinus pressure.   Eyes: Negative for discharge.   Respiratory: Negative for cough.   Cardiovascular: Negative for chest pain.  Gastrointestinal: Positive for abdominal pain. Negative for diarrhea.  Genitourinary: Negative for frequency and hematuria.  Musculoskeletal: Negative for back pain.  Skin: Negative for rash.       Calcium deposits on skin of abdomen  Neurological: Negative for seizures and headaches.  Psychiatric/Behavioral: Negative for hallucinations.    Allergies  Crestor; Nsaids; and Ciprofloxacin  Home Medications   Prior to Admission medications   Medication Sig Start Date End Date Taking? Authorizing Provider  aspirin EC 81 MG EC tablet Take 1 tablet (81 mg total) by mouth daily. 08/17/13   Belkys  A Regalado, MD  atorvastatin (LIPITOR) 80 MG tablet Take 1 tablet (80 mg total) by mouth daily at 6 PM. 10/29/13   Amy D Clegg, NP  cinacalcet (SENSIPAR) 30 MG tablet Take 1 tablet (30 mg total) by mouth daily with breakfast. 02/26/14   Oswald Hillock, MD  cyclobenzaprine (FLEXERIL) 10 MG tablet Take 10 mg by mouth 3 (three) times daily as needed for muscle spasms.    Historical Provider, MD  Docusate Calcium (STOOL SOFTENER PO) Take 1-3 capsules by mouth daily as needed (constipation).    Historical Provider, MD  HYDROmorphone (DILAUDID) 2 MG tablet Take 1-2 tablets (2-4 mg total) by mouth every 4 (four) hours as needed for severe pain. 02/26/14   Oswald Hillock, MD  insulin aspart (NOVOLOG) 100 UNIT/ML FlexPen 5 Units, Subcutaneous, 3 times daily with meals 02/07/14   Alycia Rossetti, MD  Insulin Detemir (LEVEMIR FLEXPEN) 100 UNIT/ML Pen Inject 20 Units into the skin every evening. 10/29/13   Amy D Clegg, NP  methylcellulose (ARTIFICIAL TEARS) 1 % ophthalmic solution Place 1 drop into both eyes at bedtime as needed (dry eyes).    Historical Provider, MD  midodrine (PROAMATINE) 10 MG tablet Take 5 mg by mouth 3 (three) times a week. Monday, Wednesday and Friday    Historical Provider, MD  multivitamin (RENA-VIT) TABS tablet Take 1  tablet by mouth daily.    Historical Provider, MD  pantoprazole (PROTONIX) 40 MG tablet Take 1 tablet (40 mg total) by mouth daily. 02/18/14   Alycia Rossetti, MD  polyethylene glycol [facility] Floria Raveling) packet Take 17 g by mouth daily as needed (constipation). 10/29/13   Amy D Ninfa Meeker, NP  promethazine (PHENERGAN) 25 MG tablet Take 1 tablet (25 mg total) by mouth every 8 (eight) hours as needed for nausea or vomiting. 02/18/14   Alycia Rossetti, MD  sevelamer carbonate (RENVELA) 800 MG tablet Take 3 tablets (2,400 mg total) by mouth 3 (three) times daily with meals. 02/26/14   Oswald Hillock, MD  triamcinolone cream (KENALOG) 0.1 % Apply 1 application topically 2 (two) times daily. 02/18/14   Alycia Rossetti, MD  ursodiol (ACTIGALL) 300 MG capsule Take 1 capsule (300 mg total) by mouth 2 (two) times daily. 11/29/13   Ripudeep K Rai, MD   BP 113/56 mmHg  Pulse 123  Temp(Src) 98.5 F (36.9 C) (Oral)  Resp 18  Ht 5\' 7"  (1.702 m)  Wt 260 lb (117.935 kg)  BMI 40.71 kg/m2  SpO2 97% Physical Exam  Constitutional: She is oriented to person, place, and time. She appears well-developed.  HENT:  Head: Normocephalic.  Eyes: Conjunctivae are normal.  Neck: Neck supple. No tracheal deviation present.  Cardiovascular:  No murmur heard. Musculoskeletal: Normal range of motion. She exhibits no edema.  Neurological: She is oriented to person, place, and time.  Skin: Skin is warm.  Skin inflammation of LLQ and RLQ with tenderness  Psychiatric: She has a normal mood and affect.  Nursing note and vitals reviewed.   ED Course  Procedures (including critical care time) DIAGNOSTIC STUDIES: Oxygen Saturation is 97% on RA, normal by my interpretation.    COORDINATION OF CARE: 12:27 PM Discussed treatment plan with pt at bedside and pt agreed to plan. Advised pt to follow up with PCP tomorrow.  Labs Review Labs Reviewed - No data to display  Imaging Review No results found.   EKG  Interpretation None      MDM   Final diagnoses:  None   The chart was scribed for me under my direct supervision.  I personally performed the history, physical, and medical decision making and all procedures in the evaluation of this patient.Hannah Diego, MD 03/03/14 [phone]

## 2014-03-04 NOTE — Telephone Encounter (Signed)
Pt received meds from ER on 11/15 does not need refill at this time

## 2014-03-04 NOTE — Telephone Encounter (Signed)
Ok to refill??  Last office visit 02/18/2014.  Was admitted to hospital with severe cellulitis of abdominal wall.

## 2014-03-06 ENCOUNTER — Other Ambulatory Visit: Payer: Self-pay | Admitting: Family Medicine

## 2014-03-06 MED ORDER — HYDROMORPHONE HCL 4 MG PO TABS: 4.0000 mg | ORAL_TABLET | ORAL | Status: DC | PRN

## 2014-03-06 NOTE — Telephone Encounter (Signed)
Patient recently seen in ER.   New prescriptions given to patient: 02/26/2014- Dilaudid 2mg  1-2 tabs PO Q 4hours PRN, #30. 03/03/2014- Dilaudid 4mg  1 tab PO Q 4hours PRN, #30.  States that she is taking pain medication Q 4hours, so quantity of 30 only lasts 5 days.   MD please advise.

## 2014-03-06 NOTE — Telephone Encounter (Signed)
She needs to decrease the amount of medication she is using, decrease to three times a day I do not write for long term dilaudid and I still dont see the reason for the severe pain as the biopsy was negative.  I will give her another 30 tabs of the 4mg , make an appt to discuss pain control

## 2014-03-06 NOTE — Telephone Encounter (Signed)
Pt was taking HYDROmorphone (DILAUDID) 2 MG tablet but the man at hospital she states gave her 4 MG  [id number]

## 2014-03-07 ENCOUNTER — Encounter (HOSPITAL_COMMUNITY): Payer: Medicare Other

## 2014-03-07 MED ORDER — HYDROMORPHONE HCL 4 MG PO TABS: 4.0000 mg | ORAL_TABLET | Freq: Three times a day (TID) | ORAL | Status: DC

## 2014-03-07 NOTE — Telephone Encounter (Signed)
Call placed to patient. LMTRC.  

## 2014-03-07 NOTE — Telephone Encounter (Signed)
Pt aware of all below and appt was made for f/u for her pain medication. Rx Printed and place on md desk to be signed and placed up front.

## 2014-03-08 ENCOUNTER — Encounter: Payer: Self-pay | Admitting: Surgery

## 2014-03-11 ENCOUNTER — Telehealth: Payer: Self-pay | Admitting: *Deleted

## 2014-03-11 ENCOUNTER — Ambulatory Visit (HOSPITAL_COMMUNITY)
Admission: RE | Admit: 2014-03-11 | Discharge: 2014-03-11 | Disposition: A | Discharge status: Home / Self Care | Payer: Medicare Other | Source: Ambulatory Visit | Attending: Surgery | Admitting: Surgery

## 2014-03-11 ENCOUNTER — Encounter: Payer: Self-pay | Admitting: Family Medicine

## 2014-03-11 ENCOUNTER — Ambulatory Visit (INDEPENDENT_AMBULATORY_CARE_PROVIDER_SITE_OTHER)
Admission: RE | Admit: 2014-03-11 | Discharge: 2014-03-11 | Disposition: A | Discharge status: Home / Self Care | Payer: Medicare Other | Source: Ambulatory Visit | Attending: Surgery | Admitting: Surgery

## 2014-03-11 ENCOUNTER — Ambulatory Visit: Payer: Medicare Other | Admitting: Surgery

## 2014-03-11 ENCOUNTER — Ambulatory Visit (INDEPENDENT_AMBULATORY_CARE_PROVIDER_SITE_OTHER): Payer: Medicare Other | Admitting: Surgery

## 2014-03-11 ENCOUNTER — Encounter: Payer: Self-pay | Admitting: Surgery

## 2014-03-11 ENCOUNTER — Ambulatory Visit (INDEPENDENT_AMBULATORY_CARE_PROVIDER_SITE_OTHER): Payer: Medicare Other | Admitting: Family Medicine

## 2014-03-11 VITALS — BP 132/70 | HR 68 | Temp 98.1°F | Resp 16

## 2014-03-11 VITALS — BP 109/58 | HR 118 | Temp 97.8°F | Resp 18 | Ht 67.0 in | Wt 260.0 lb

## 2014-03-11 DIAGNOSIS — M793 Panniculitis, unspecified: Secondary | ICD-10-CM

## 2014-03-11 DIAGNOSIS — E1140 Type 2 diabetes mellitus with diabetic neuropathy, unspecified: Secondary | ICD-10-CM

## 2014-03-11 DIAGNOSIS — N186 End stage renal disease: Secondary | ICD-10-CM

## 2014-03-11 DIAGNOSIS — I739 Peripheral vascular disease, unspecified: Secondary | ICD-10-CM

## 2014-03-11 DIAGNOSIS — I6523 Occlusion and stenosis of bilateral carotid arteries: Secondary | ICD-10-CM

## 2014-03-11 DIAGNOSIS — Z01818 Encounter for other preprocedural examination: Secondary | ICD-10-CM | POA: Diagnosis present

## 2014-03-11 DIAGNOSIS — I2510 Atherosclerotic heart disease of native coronary artery without angina pectoris: Secondary | ICD-10-CM

## 2014-03-11 DIAGNOSIS — E1149 Type 2 diabetes mellitus with other diabetic neurological complication: Secondary | ICD-10-CM

## 2014-03-11 MED ORDER — OXYCODONE HCL ER 20 MG PO T12A: 20.0000 mg | EXTENDED_RELEASE_TABLET | Freq: Two times a day (BID) | ORAL | Status: DC

## 2014-03-11 MED ORDER — HYDROMORPHONE HCL 4 MG PO TABS: 4.0000 mg | ORAL_TABLET | Freq: Three times a day (TID) | ORAL | Status: DC

## 2014-03-11 NOTE — Addendum Note (Signed)
Addended by: Mena Goes on: 03/11/2014 03:35 PM   Modules accepted: Orders

## 2014-03-11 NOTE — Progress Notes (Signed)
Patient ID: Hannah [name], female   DOB: 12-23-1970, 43 y.o.   MRN: [id number]   Subjective:    Patient ID: Hannah [name], female    DOB: 11/06/1970, 43 y.o.   MRN: [id number]  Patient presents for Hospital F/U  Patient here for hospital follow-up. 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 She did have a punch biopsy done however it does not appear was deep enough and did not show any significant tissue abnormality. Actually since she had the biopsy done she's had increased pain at the biopsy site as well as redness and some drainage on her bandage. She's not had any fever. Her pain is unbearable at that site she also has 2 new knots", up on her right upper quadrant similar to how the one on the left lower quadrant started. She is taking Dilaudid 6 times a day in order to control her pain. Unfortunately because of the ongoing issues with her abdomen and skin they're unable to put in a new graft for her hemodialysis therefore she is using a permacatheter  11/2 Visit Pt here with left lower quadrant pain over soft tissue mass, She has been admitted twice for probable cellulitis of the area, treated with IV antibiotics as well as oral antibiotics Augmentin and Doxy which she completed not too long ago, with no change in the area. Pain worsens if you touch the area or she bends over. Had CT scan done 10/13 concerning for post inflammatory changes vs mild cellulitis of right abdominal wall but nothing of the left side, had repeat CT done on 10/30 IMPRESSION: 1. 1.5 by 1.2 cm left lower lobe pulmonary nodule. This lesion measured 2.4 by 2.2 cm back on 06/09/2011 ; had shrunk down to 0.4 cm back on 08/02/2012 ; and has now enlarged to the current measurement noted above. Accordingly, recurrence of malignancy is a concern. There are small subpleural nodules in the right lower  lobe which appear stable from 2014. 2. Progressive and somewhat generalized porta hepatis, gastrohepatic ligament, and retroperitoneal lymph nodal enlargement. An aortocaval lymph node is mildly enlarged at 1.2 cm in short axis. 3. No change in the right groin region subcutaneous stranding overlying the right common femoral vasculature. Appearance likely postinflammatory. 4. Prominent stool throughout the colon favors constipation. 5. Aortoiliac atherosclerotic vascular disease. 6. Persistent small left pleural effusion. Resolution of the prior right pleural effusion. 7. Mild cardiomegaly with coronary artery atherosclerosis.   DM- last A1C 7.8%, taking Levemir 15 units, novolog 5 units qac, no hypoglycemia     Review Of Systems:  GEN- denies fatigue, fever, weight loss,weakness, recent illness HEENT- denies eye drainage, change in vision, nasal discharge, CVS- denies chest pain, palpitations RESP- denies SOB, cough, wheeze ABD- denies N/V, change in stools, abd pain GU- denies dysuria, hematuria, dribbling, incontinence MSK- denies joint pain, muscle aches, injury Neuro- denies headache, dizziness, syncope, seizure activity       Objective:    BP 132/70 mmHg  Pulse 68  Temp(Src) 98.1 F (36.7 C) (Oral)  Resp 16  LMP 09/17/2013 (Approximate) GEN- NAD, alert and oriented x3 HEENT- PERRL, EOMI, non injected sclera, pink conjunctiva, MMM, oropharynx clear, blind right eye Neck- Supple, no LAD CVS- RRR, no murmur RESP-CTAB ABD-NABS,soft,,large pannus, indurated lesions upper abdomen approx 2x2cm TTP, some bruising at areas of insulin injections, RLQ large are of induration NT, LLE large 13x3cm area  of induration and erythema,  Biopsy site, mild yellow drainage TTP,  EXT- pedal edema Pulses- Radial, DP- 2+      Assessment & Plan:      Problem List Items Addressed This Visit    None      Note: This dictation was prepared with Dragon dictation along with smaller  phrase technology. Any transcriptional errors that result from this process are unintentional.

## 2014-03-11 NOTE — Telephone Encounter (Signed)
Received request from pharmacy for PA on Protonix.   PA submitted.   Dx: K21.9

## 2014-03-11 NOTE — Assessment & Plan Note (Signed)
Uncontrolled but improving, continue current dose of levemir and Novolog

## 2014-03-11 NOTE — Assessment & Plan Note (Signed)
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 She's had this issue for the past couple months with ongoing and increasing pain. I'll change her pain regimen to the OxyContin 20 mg twice a day and Dilaudid for her for breakthrough pain. I will also get her set up to see dermatology [facility]. I spoke with her nephrologist today I'm concerned about the drainage from the incision she is also an uncontrolled diabetic he will give her a couple of days of antibiotics with dialysis.

## 2014-03-11 NOTE — Progress Notes (Addendum)
Patient name: Hannah [name] MRN: [id number] DOB: Aug 20, 1970 Sex: female     Chief Complaint  Patient presents with  . Routine Post Op    HISTORY OF PRESENT ILLNESS: The patient is back today for follow-up.  She is well known to me.  I initially saw her for both renal failure and carotid stenosis.  I was planning on dialysis access and she was sent for cardiac clearance.  During her evaluation she required admission and a prolonged hospital stay secondary to heart failure and cardiomyopathy.  During this hospitalization she initiated dialysis.  She did require a cardiac mechanical assist device to help her during her acute heart failure.  When this was removed she developed an ischemic right leg.  She was taken to the operating room on 09/17/2013 by Dr. Oneida Alar and underwent a right femoral endarterectomy as well as thrombectomy and 4 compartment fasciotomies.  She has had a prolonged wound from her fasciotomy site on the right.  On 01/03/2014 Dr. Scot Dock performed a right radiocephalic fistula.  Unfortunately, she developed a steal syndrome and on 01/04/2014, Dr. early ligated her fistula.  She now dialyzes through a left IJ Diatek  catheter that was placed by Dr. Kellie Simmering on 09/12/2013.  Dr. Scot Dock most recently saw her on 02/14/2014 for dialysis access.  The patient has no radial pulse on the left and therefore no access is planned for the left.  She has had steal syndrome on the right and therefore she is not a candidate for right arm access.  She had a thrombectomy performed to the right groin with an extensive wound so I would avoid the right groin.  She had a new wound on the left.  She is back today for further ultrasound evaluations and a wound check to see if she is a candidate for intervention in the left groin.  Her last carotid Doppler study was in May 2015.  This shows 80-99 percent left carotid stenosis and 60-79 percent right carotid stenosis.  She remains asymptomatic from a carotid  perspective.  The patient has a area in her left lower abdomen/inguinal region which was recently biopsied for calciphylaxis.  This was negative.  She still complains of pain in this area as well as redness.  The patient is on anticoagulation for her heart failure.  The patient suffers from hypercholesterolemia which is treated with a statin.  She is a diabetic.  She takes insulin.  This is complicated by retinopathy.  Her most recent hemoglobin A1c was 7.8  Past Medical History  Diagnosis Date  . High cholesterol   . Diabetic retinopathy   . Peripheral neuropathy     "tips of toes"  . Blind right eye   . CHF (congestive heart failure)   . CAD (coronary artery disease)   . GERD (gastroesophageal reflux disease)   . Hypertension   . History of lung cancer 07/2011    s/p left lower lobectomy  . Diabetes mellitus     IDDM  . Cataract of right eye   . Ulcer of toe of right foot 07/10/2012    great toe  . Breast calcification, left 06/2012  . Acute biphenotypic leukemia   . CKD (chronic kidney disease) stage 5, GFR less than 15 ml/min   . Nephrotic syndrome   . Gastritis     H/o gastritis on prior endoscopy  . Anemia   . Carotid artery disease   . Gallstones   . Myocardial infarction 5/16  Past Surgical History  Procedure Laterality Date  . Cardiac catheterization  07/16/2011  . Incision and drainage breast abscess Left   . Tubal ligation  1994  . Vitrectomy  2010    2 on left, 1 on right  . Cesarean section  1991; 1994  . Video assisted thoracoscopy (vats)/ lobectomy Left 07/30/2011    left main thoracotomy, left lower lobectomy, mediastinal lymph node dissection  . Lobectomy    . Colonoscopy with esophagogastroduodenoscopy (egd) N/A 08/14/2012    Procedure: COLONOSCOPY WITH ESOPHAGOGASTRODUODENOSCOPY (EGD);  Surgeon: Danie Binder, MD;  [facility];  Service: Endoscopy;  Laterality: N/A;  10:45-moved to [address] to notify pt  . Breast lumpectomy with  needle localization Left 11/14/2012    Procedure: BREAST LUMPECTOMY WITH NEEDLE LOCALIZATION;  Surgeon: Marcello Moores A. Cornett, MD;  Location: [place];  Service: General;  Laterality: Left;  . Insertion of dialysis catheter Left 09/12/2013    Procedure: INSERTION OF DIALYSIS CATHETER;  Surgeon: Mal Misty, MD;  Location: [place];  Service: Vascular;  Laterality: Left;  . Embolectomy Right 09/17/2013    Procedure: Thrombectomy of Right Common Femoral Artery;  Surgeon: Elam Dutch, MD;  Location: [facility] OR;  Service: Vascular;  Laterality: Right;  . Endarterectomy femoral Right 09/17/2013    Procedure: Right Femoral Endarterectomy;  Surgeon: Elam Dutch, MD;  Location: [facility] OR;  Service: Vascular;  Laterality: Right;  . Patch angioplasty Right 09/17/2013    Procedure: Vein Patch Angioplasty of Right Femoral Artery;  Surgeon: Elam Dutch, MD;  Location: [place];  Service: Vascular;  Laterality: Right;  . Fasciotomy Right 09/17/2013    Procedure: Four Compartment Fasciotomy;  Surgeon: Elam Dutch, MD;  Location: [place];  Service: Vascular;  Laterality: Right;  . Fasciotomy closure Right 09/19/2013    Procedure: FASCIOTOMY CLOSURE;  Surgeon: Elam Dutch, MD;  Location: [place];  Service: Vascular;  Laterality: Right;  regional block and monitored anesthesia care used  . I&d extremity Right 09/28/2013    Procedure: IRRIGATION AND DEBRIDEMENT EXTREMITY;  Surgeon: Serafina Mitchell, MD;  Location: [place];  Service: Vascular;  Laterality: Right;  . Application of wound vac Right 09/28/2013    Procedure: APPLICATION OF WOUND VAC;  Surgeon: Serafina Mitchell, MD;  Location: [place];  Service: Vascular;  Laterality: Right;  . Av fistula placement Right 01/03/2014    Procedure: ARTERIOVENOUS (AV) FISTULA CREATION;  Surgeon: Angelia Mould, MD;  Location: [place];  Service: Vascular;  Laterality: Right;  . Ligation of arteriovenous  fistula Right 01/04/2014    Procedure: LIGATION OF ARTERIOVENOUS  FISTULA;  Surgeon:  Rosetta Posner, MD;  Location: [place];  Service: Vascular;  Laterality: Right;    History   Social History  . Marital Status: Married    Spouse Name: N/A    Number of Children: 3  . Years of Education: N/A   Occupational History  . Not on file.   Social History Main Topics  . Smoking status: Former Smoker -- 2.00 packs/day for 30 years    Quit date: 08/01/2011  . Smokeless tobacco: Never Used  . Alcohol Use: No  . Drug Use: No  . Sexual Activity: Yes   Other Topics Concern  . Not on file   Social History Narrative    Family History  Problem Relation Age of Onset  . Coronary artery disease Father   . Asthma Father   . COPD Father   .  Hypertension Father   . Hyperlipidemia Father   . Diabetes Father   . Congestive Heart Failure Father   . Heart disease Father     before age 18  . Heart attack Father   . Peripheral vascular disease Father   . Hypertension Mother   . Hyperlipidemia Mother   . Diabetes Mother   . Cancer Mother   . Cancer Maternal Aunt      three aunts, bone, breast, ?  . Hypertension Brother   . Diabetes Brother   . Diabetes Sister   . Colon cancer Neg Hx   . Celiac disease Neg Hx   . Crohn's disease Neg Hx   . Ulcerative colitis Neg Hx   . Lung cancer Maternal Grandmother     Allergies as of 03/11/2014 - Review Complete 03/11/2014  Allergen Reaction Noted  . Crestor [rosuvastatin] Other (See Comments) 11/02/2012  . Nsaids Other (See Comments) 05/05/2013  . Ciprofloxacin Rash 05/31/2011    Current Outpatient Prescriptions on File Prior to Visit  Medication Sig Dispense Refill  . aspirin EC 81 MG EC tablet Take 1 tablet (81 mg total) by mouth daily. 30 tablet 0  . atorvastatin (LIPITOR) 80 MG tablet Take 1 tablet (80 mg total) by mouth daily at 6 PM. 30 tablet 6  . cinacalcet (SENSIPAR) 30 MG tablet Take 1 tablet (30 mg total) by mouth daily with breakfast. 60 tablet   . cyclobenzaprine (FLEXERIL) 10 MG tablet Take 10 mg by mouth 3 (three)  times daily as needed for muscle spasms.    Mariane Baumgarten Calcium (STOOL SOFTENER PO) Take 1-3 capsules by mouth daily as needed (constipation).    Marland Kitchen HYDROmorphone (DILAUDID) 4 MG tablet Take 1 tablet (4 mg total) by mouth 3 (three) times daily. 30 tablet 0  . insulin aspart (NOVOLOG) 100 UNIT/ML FlexPen 5 Units, Subcutaneous, 3 times daily with meals 15 mL 11  . Insulin Detemir (LEVEMIR FLEXPEN) 100 UNIT/ML Pen Inject 20 Units into the skin every evening. 15 mL 11  . methylcellulose (ARTIFICIAL TEARS) 1 % ophthalmic solution Place 1 drop into both eyes at bedtime as needed (dry eyes).    . midodrine (PROAMATINE) 10 MG tablet Take 5 mg by mouth 3 (three) times a week. Monday, Wednesday and Friday    . multivitamin (RENA-VIT) TABS tablet Take 1 tablet by mouth daily.    . pantoprazole (PROTONIX) 40 MG tablet Take 1 tablet (40 mg total) by mouth daily. 30 tablet 6  . polyethylene glycol (MIRALAX / GLYCOLAX) packet Take 17 g by mouth daily as needed (constipation). 14 each 6  . promethazine (PHENERGAN) 25 MG tablet Take 1 tablet (25 mg total) by mouth every 8 (eight) hours as needed for nausea or vomiting. 20 tablet 2  . sevelamer carbonate (RENVELA) 800 MG tablet Take 3 tablets (2,400 mg total) by mouth 3 (three) times daily with meals.    . triamcinolone cream (KENALOG) 0.1 % Apply 1 application topically 2 (two) times daily. 45 g 1  . ursodiol (ACTIGALL) 300 MG capsule Take 1 capsule (300 mg total) by mouth 2 (two) times daily. 60 capsule 4   No current facility-administered medications on file prior to visit.     REVIEW OF SYSTEMS: Cardiovascular: No chest pain, chest pressure, positive shortness of breath with exertion.  No claudication or rest pain,  No history of DVT or phlebitis. Pulmonary: No productive cough, asthma or wheezing. Neurologic: No weakness, paresthesias, aphasia, or amaurosis. No dizziness. Hematologic: No bleeding  problems or clotting disorders. Musculoskeletal: No joint  pain or joint swelling. Gastrointestinal: No blood in stool or hematemesis Genitourinary: No dysuria or hematuria. Psychiatric:: No history of major depression. Integumentary: Left lower quadrant abdominal wound Constitutional: No fever or chills.  PHYSICAL EXAMINATION:   Vital signs are BP 109/58 mmHg  Pulse 118  Temp(Src) 97.8 F (36.6 C) (Oral)  Resp 18  Ht 5\' 7"  (1.702 m)  Wt 260 lb (117.935 kg)  BMI 40.71 kg/m2  SpO2 98%  LMP 09/17/2013 (Approximate) General: The patient appears their stated age. HEENT:  No gross abnormalities Pulmonary:  Non labored breathing Abdomen: Blistering with erythema and induration in the left lower quadrant right lower quadrant/inguinal wound has completely healed Musculoskeletal: There are no major deformities. Neurologic: No focal weakness or paresthesias are detected, Skin: There are no ulcer or rashes noted. Psychiatric: The patient has normal affect. Cardiovascular: There is a regular rate and rhythm without significant murmur appreciated.   Diagnostic Studies Ultrasound studies were ordered and reviewed today.  The patient has triphasic waveforms bilaterally and noncompressible vessels.  Toe pressure is 32 on the left and 56 on the right   Assessment: #1: End-stage renal disease #2: Carotid occlusive disease, asymptomatic Plan: : The patient currently has a left-sided catheter in place.  Currently she is not a candidate for dialysis access.  Her best option is going to be a left femoral graft, however with the induration and erythema in the left lower quadrant, I would not place this at this time given the high risk for infection.  She has developed steal after a fistula on the right and therefore she is not a candidate for a right arm fistula or graft.  She does not have a palpable radial pulse on the left and therefore I would not place anything on the left.  She had an extensive wound in the right groin which has now healed so I would  avoid the right groin as possible.  The patient will contact me once her infectious issues have resolved otherwise, she will follow up in 3 months.  #2: The patient remains asymptomatic from her carotid disease.  Because of her issues with infection and access, she is not a candidate for carotid stenting at this time.  I do not know if her heart would tolerate a endarterectomy.  I'll have to refer to cardiology for this.  I will repeat her carotid ultrasound in 3 months when she returns.  Eldridge Abrahams, M.D. [facility] Office: [phone] Pager:  [phone]

## 2014-03-11 NOTE — Patient Instructions (Addendum)
Continue current dose of insulin Oxycontin 20mg  twice a day  Dilaudid as needed for breakthrough Referral to [facility]  F/U as previous

## 2014-03-12 ENCOUNTER — Inpatient Hospital Stay: Payer: Medicare Other | Admitting: Family Medicine

## 2014-03-12 ENCOUNTER — Telehealth: Payer: Self-pay | Admitting: *Deleted

## 2014-03-12 MED ORDER — DEXLANSOPRAZOLE 60 MG PO CPDR: 60.0000 mg | DELAYED_RELEASE_CAPSULE | Freq: Every day | ORAL | Status: AC

## 2014-03-12 NOTE — Telephone Encounter (Signed)
Okay to switch to dexilant 60mg  once a day

## 2014-03-12 NOTE — Telephone Encounter (Signed)
[facility], pt has appointment on Jan 20 at 3:30pm at [facility] Dermatology Dept.

## 2014-03-12 NOTE — Telephone Encounter (Signed)
Received call from [place] stating that protonix is not in pts formulary, states that [place] are in formulary states could be on one of these unless there is reason why she couldn't use them. Case number is [id number]

## 2014-03-12 NOTE — Telephone Encounter (Signed)
PA in process.   MD please advise.

## 2014-03-12 NOTE — Telephone Encounter (Signed)
Prescription sent to pharmacy.   Call placed to patient. LMTRC.  

## 2014-03-18 ENCOUNTER — Telehealth: Payer: Self-pay | Admitting: Family Medicine

## 2014-03-18 NOTE — Telephone Encounter (Signed)
PA denied.   Patient switched to [place].

## 2014-03-18 NOTE — Telephone Encounter (Signed)
Hannah [name] has called from [facility] to let us know about how the PT wounds are doing. She states that the wound on the LLQ were irritated and raw looking. There is a scab over the area where the biopsy was done the size is 4x5 cm. The pt has no fever and no warmth to the infected area.

## 2014-03-18 NOTE — Telephone Encounter (Signed)
MD to be made aware.

## 2014-03-18 NOTE — Telephone Encounter (Signed)
Patient made aware via MyChart. ?

## 2014-03-18 NOTE — Telephone Encounter (Signed)
Noted she is already getting antibiotics by [facility] with dialysis and has appt for [facility]

## 2014-03-19 ENCOUNTER — Ambulatory Visit (HOSPITAL_COMMUNITY)
Admission: RE | Admit: 2014-03-19 | Discharge: 2014-03-19 | Disposition: A | Discharge status: Home / Self Care | Payer: Medicare Other | Source: Ambulatory Visit | Attending: Internal Medicine | Admitting: Internal Medicine

## 2014-03-19 ENCOUNTER — Emergency Department (HOSPITAL_COMMUNITY)
Admission: EM | Admit: 2014-03-19 | Discharge: 2014-03-19 | Disposition: A | Discharge status: Home / Self Care | Payer: Medicare Other | Attending: Emergency Medicine | Admitting: Emergency Medicine

## 2014-03-19 ENCOUNTER — Encounter (HOSPITAL_COMMUNITY): Payer: Self-pay | Admitting: Emergency Medicine

## 2014-03-19 ENCOUNTER — Ambulatory Visit (HOSPITAL_COMMUNITY)
Admission: RE | Admit: 2014-03-19 | Discharge: 2014-03-19 | Disposition: A | Discharge status: Home / Self Care | Payer: Medicare Other | Source: Ambulatory Visit | Attending: Cardiology | Admitting: Cardiology

## 2014-03-19 VITALS — BP 104/72 | HR 132 | Wt 264.1 lb

## 2014-03-19 DIAGNOSIS — Z87891 Personal history of nicotine dependence: Secondary | ICD-10-CM | POA: Diagnosis not present

## 2014-03-19 DIAGNOSIS — R000 Tachycardia, unspecified: Secondary | ICD-10-CM | POA: Insufficient documentation

## 2014-03-19 DIAGNOSIS — I2510 Atherosclerotic heart disease of native coronary artery without angina pectoris: Secondary | ICD-10-CM | POA: Insufficient documentation

## 2014-03-19 DIAGNOSIS — I959 Hypotension, unspecified: Secondary | ICD-10-CM | POA: Diagnosis not present

## 2014-03-19 DIAGNOSIS — Z7901 Long term (current) use of anticoagulants: Secondary | ICD-10-CM | POA: Insufficient documentation

## 2014-03-19 DIAGNOSIS — K8020 Calculus of gallbladder without cholecystitis without obstruction: Secondary | ICD-10-CM | POA: Diagnosis not present

## 2014-03-19 DIAGNOSIS — E1021 Type 1 diabetes mellitus with diabetic nephropathy: Secondary | ICD-10-CM | POA: Insufficient documentation

## 2014-03-19 DIAGNOSIS — R05 Cough: Secondary | ICD-10-CM | POA: Diagnosis not present

## 2014-03-19 DIAGNOSIS — I878 Other specified disorders of veins: Secondary | ICD-10-CM | POA: Diagnosis not present

## 2014-03-19 DIAGNOSIS — I82621 Acute embolism and thrombosis of deep veins of right upper extremity: Secondary | ICD-10-CM | POA: Diagnosis not present

## 2014-03-19 DIAGNOSIS — Z856 Personal history of leukemia: Secondary | ICD-10-CM | POA: Diagnosis not present

## 2014-03-19 DIAGNOSIS — T45521A Poisoning by antithrombotic drugs, accidental (unintentional), initial encounter: Secondary | ICD-10-CM | POA: Diagnosis not present

## 2014-03-19 DIAGNOSIS — N186 End stage renal disease: Secondary | ICD-10-CM | POA: Insufficient documentation

## 2014-03-19 DIAGNOSIS — I5022 Chronic systolic (congestive) heart failure: Secondary | ICD-10-CM | POA: Insufficient documentation

## 2014-03-19 DIAGNOSIS — I6523 Occlusion and stenosis of bilateral carotid arteries: Secondary | ICD-10-CM | POA: Insufficient documentation

## 2014-03-19 DIAGNOSIS — Z79899 Other long term (current) drug therapy: Secondary | ICD-10-CM | POA: Insufficient documentation

## 2014-03-19 DIAGNOSIS — Z8674 Personal history of sudden cardiac arrest: Secondary | ICD-10-CM | POA: Insufficient documentation

## 2014-03-19 DIAGNOSIS — R570 Cardiogenic shock: Secondary | ICD-10-CM | POA: Insufficient documentation

## 2014-03-19 DIAGNOSIS — N049 Nephrotic syndrome with unspecified morphologic changes: Secondary | ICD-10-CM | POA: Diagnosis not present

## 2014-03-19 DIAGNOSIS — Z7902 Long term (current) use of antithrombotics/antiplatelets: Secondary | ICD-10-CM | POA: Diagnosis not present

## 2014-03-19 DIAGNOSIS — I517 Cardiomegaly: Secondary | ICD-10-CM | POA: Diagnosis not present

## 2014-03-19 DIAGNOSIS — Z7982 Long term (current) use of aspirin: Secondary | ICD-10-CM | POA: Insufficient documentation

## 2014-03-19 DIAGNOSIS — E1069 Type 1 diabetes mellitus with other specified complication: Secondary | ICD-10-CM | POA: Diagnosis not present

## 2014-03-19 DIAGNOSIS — I255 Ischemic cardiomyopathy: Secondary | ICD-10-CM | POA: Diagnosis not present

## 2014-03-19 DIAGNOSIS — T50901A Poisoning by unspecified drugs, medicaments and biological substances, accidental (unintentional), initial encounter: Secondary | ICD-10-CM

## 2014-03-19 DIAGNOSIS — E11311 Type 2 diabetes mellitus with unspecified diabetic retinopathy with macular edema: Secondary | ICD-10-CM | POA: Insufficient documentation

## 2014-03-19 DIAGNOSIS — E10319 Type 1 diabetes mellitus with unspecified diabetic retinopathy without macular edema: Secondary | ICD-10-CM | POA: Insufficient documentation

## 2014-03-19 DIAGNOSIS — E782 Mixed hyperlipidemia: Secondary | ICD-10-CM | POA: Diagnosis not present

## 2014-03-19 DIAGNOSIS — H5441 Blindness, right eye, normal vision left eye: Secondary | ICD-10-CM | POA: Diagnosis not present

## 2014-03-19 DIAGNOSIS — I120 Hypertensive chronic kidney disease with stage 5 chronic kidney disease or end stage renal disease: Secondary | ICD-10-CM | POA: Diagnosis not present

## 2014-03-19 DIAGNOSIS — I252 Old myocardial infarction: Secondary | ICD-10-CM | POA: Insufficient documentation

## 2014-03-19 DIAGNOSIS — K219 Gastro-esophageal reflux disease without esophagitis: Secondary | ICD-10-CM | POA: Diagnosis not present

## 2014-03-19 DIAGNOSIS — I5042 Chronic combined systolic (congestive) and diastolic (congestive) heart failure: Secondary | ICD-10-CM

## 2014-03-19 DIAGNOSIS — Z79891 Long term (current) use of opiate analgesic: Secondary | ICD-10-CM | POA: Diagnosis not present

## 2014-03-19 DIAGNOSIS — M549 Dorsalgia, unspecified: Secondary | ICD-10-CM | POA: Diagnosis not present

## 2014-03-19 DIAGNOSIS — Z872 Personal history of diseases of the skin and subcutaneous tissue: Secondary | ICD-10-CM | POA: Insufficient documentation

## 2014-03-19 DIAGNOSIS — Z992 Dependence on renal dialysis: Secondary | ICD-10-CM | POA: Diagnosis not present

## 2014-03-19 DIAGNOSIS — Z85118 Personal history of other malignant neoplasm of bronchus and lung: Secondary | ICD-10-CM | POA: Insufficient documentation

## 2014-03-19 DIAGNOSIS — Z7952 Long term (current) use of systemic steroids: Secondary | ICD-10-CM | POA: Insufficient documentation

## 2014-03-19 DIAGNOSIS — I509 Heart failure, unspecified: Secondary | ICD-10-CM | POA: Insufficient documentation

## 2014-03-19 DIAGNOSIS — J9811 Atelectasis: Secondary | ICD-10-CM | POA: Diagnosis not present

## 2014-03-19 DIAGNOSIS — Z794 Long term (current) use of insulin: Secondary | ICD-10-CM | POA: Diagnosis not present

## 2014-03-19 DIAGNOSIS — E8770 Fluid overload, unspecified: Secondary | ICD-10-CM | POA: Diagnosis not present

## 2014-03-19 DIAGNOSIS — N185 Chronic kidney disease, stage 5: Secondary | ICD-10-CM | POA: Insufficient documentation

## 2014-03-19 DIAGNOSIS — R0989 Other specified symptoms and signs involving the circulatory and respiratory systems: Secondary | ICD-10-CM | POA: Diagnosis not present

## 2014-03-19 MED ORDER — CLOPIDOGREL BISULFATE 75 MG PO TABS: 75.0000 mg | ORAL_TABLET | Freq: Every day | ORAL | Status: AC

## 2014-03-19 MED ORDER — CARVEDILOL 3.125 MG PO TABS: 3.1250 mg | ORAL_TABLET | Freq: Two times a day (BID) | ORAL | Status: AC

## 2014-03-19 NOTE — ED Provider Notes (Signed)
CSN: [id number]     Arrival date & time 03/19/14  2319 History  This chart was scribed for [name], MD by Delphia Grates, ED Scribe. This patient was seen in room APA10/APA10 and the patient's care was started at 11:32 PM.     Chief Complaint  Patient presents with  . Ingestion      The history is provided by the patient. No language interpreter was used.     HPI Comments: Hannah [name] is a 43 y.o. female with history of CAD, CHF, and MI who presents to the Emergency Department complaining of ingestion of Plavix PTA. Patient states she was prescribed Plavix today for dual antiplatelet therapy, however, she reports she accidentally took 2 doses. Patient  also takes aspirin. She denies any pain or any symptoms at present. No bleeding. No HA. No other comlaints   Past Medical History  Diagnosis Date  . High cholesterol   . Diabetic retinopathy   . Peripheral neuropathy     "tips of toes"  . Blind right eye   . CHF (congestive heart failure)   . CAD (coronary artery disease)   . GERD (gastroesophageal reflux disease)   . Hypertension   . History of lung cancer 07/2011    s/p left lower lobectomy  . Diabetes mellitus     IDDM  . Cataract of right eye   . Ulcer of toe of right foot 07/10/2012    great toe  . Breast calcification, left 06/2012  . Acute biphenotypic leukemia   . CKD (chronic kidney disease) stage 5, GFR less than 15 ml/min   . Nephrotic syndrome   . Gastritis     H/o gastritis on prior endoscopy  . Anemia   . Carotid artery disease   . Gallstones   . Myocardial infarction 5/16   Past Surgical History  Procedure Laterality Date  . Cardiac catheterization  07/16/2011  . Incision and drainage breast abscess Left   . Tubal ligation  1994  . Vitrectomy  2010    2 on left, 1 on right  . Cesarean section  1991; 1994  . Video assisted thoracoscopy (vats)/ lobectomy Left 07/30/2011    left main thoracotomy, left lower lobectomy, mediastinal lymph node  dissection  . Lobectomy    . Colonoscopy with esophagogastroduodenoscopy (egd) N/A 08/14/2012    Procedure: COLONOSCOPY WITH ESOPHAGOGASTRODUODENOSCOPY (EGD);  Surgeon: Danie Binder, MD;  [facility];  Service: Endoscopy;  Laterality: N/A;  10:45-moved to [address] to notify pt  . Breast lumpectomy with needle localization Left 11/14/2012    Procedure: BREAST LUMPECTOMY WITH NEEDLE LOCALIZATION;  Surgeon: Marcello Moores A. Cornett, MD;  Location: [place];  Service: General;  Laterality: Left;  . Insertion of dialysis catheter Left 09/12/2013    Procedure: INSERTION OF DIALYSIS CATHETER;  Surgeon: Mal Misty, MD;  Location: [place];  Service: Vascular;  Laterality: Left;  . Embolectomy Right 09/17/2013    Procedure: Thrombectomy of Right Common Femoral Artery;  Surgeon: Elam Dutch, MD;  Location: [facility] OR;  Service: Vascular;  Laterality: Right;  . Endarterectomy femoral Right 09/17/2013    Procedure: Right Femoral Endarterectomy;  Surgeon: Elam Dutch, MD;  Location: [facility] OR;  Service: Vascular;  Laterality: Right;  . Patch angioplasty Right 09/17/2013    Procedure: Vein Patch Angioplasty of Right Femoral Artery;  Surgeon: Elam Dutch, MD;  Location: [place];  Service: Vascular;  Laterality: Right;  . Fasciotomy Right 09/17/2013  Procedure: Four Compartment Fasciotomy;  Surgeon: Elam Dutch, MD;  Location: [place];  Service: Vascular;  Laterality: Right;  . Fasciotomy closure Right 09/19/2013    Procedure: FASCIOTOMY CLOSURE;  Surgeon: Elam Dutch, MD;  Location: [place];  Service: Vascular;  Laterality: Right;  regional block and monitored anesthesia care used  . I&d extremity Right 09/28/2013    Procedure: IRRIGATION AND DEBRIDEMENT EXTREMITY;  Surgeon: Serafina Mitchell, MD;  Location: [place];  Service: Vascular;  Laterality: Right;  . Application of wound vac Right 09/28/2013    Procedure: APPLICATION OF WOUND VAC;  Surgeon: Serafina Mitchell, MD;  Location: [place];  Service:  Vascular;  Laterality: Right;  . Av fistula placement Right 01/03/2014    Procedure: ARTERIOVENOUS (AV) FISTULA CREATION;  Surgeon: Angelia Mould, MD;  Location: [place];  Service: Vascular;  Laterality: Right;  . Ligation of arteriovenous  fistula Right 01/04/2014    Procedure: LIGATION OF ARTERIOVENOUS  FISTULA;  Surgeon: Rosetta Posner, MD;  Location: [facility] OR;  Service: Vascular;  Laterality: Right;   Family History  Problem Relation Age of Onset  . Coronary artery disease Father   . Asthma Father   . COPD Father   . Hypertension Father   . Hyperlipidemia Father   . Diabetes Father   . Congestive Heart Failure Father   . Heart disease Father     before age 32  . Heart attack Father   . Peripheral vascular disease Father   . Hypertension Mother   . Hyperlipidemia Mother   . Diabetes Mother   . Cancer Mother   . Cancer Maternal Aunt      three aunts, bone, breast, ?  . Hypertension Brother   . Diabetes Brother   . Diabetes Sister   . Colon cancer Neg Hx   . Celiac disease Neg Hx   . Crohn's disease Neg Hx   . Ulcerative colitis Neg Hx   . Lung cancer Maternal Grandmother    History  Substance Use Topics  . Smoking status: Former Smoker -- 2.00 packs/day for 30 years    Quit date: 08/01/2011  . Smokeless tobacco: Never Used  . Alcohol Use: No   OB History    No data available     Review of Systems  A complete 10 system review of systems was obtained and all systems are negative except as noted in the HPI and PMH.    Allergies  Crestor; Nsaids; and Ciprofloxacin  Home Medications   Prior to Admission medications   Medication Sig Start Date End Date Taking? Authorizing Provider  aspirin EC 81 MG EC tablet Take 1 tablet (81 mg total) by mouth daily. 08/17/13   Belkys A Regalado, MD  atorvastatin (LIPITOR) 80 MG tablet Take 1 tablet (80 mg total) by mouth daily at 6 PM. 10/29/13   Amy D Clegg, NP  carvedilol (COREG) 3.125 MG tablet Take 1 tablet (3.125 mg total) by  mouth 2 (two) times daily with a meal. 03/19/14   Larey Dresser, MD  cinacalcet (SENSIPAR) 30 MG tablet Take 1 tablet (30 mg total) by mouth daily with breakfast. 02/26/14   Oswald Hillock, MD  clopidogrel (PLAVIX) 75 MG tablet Take 1 tablet (75 mg total) by mouth daily. 03/19/14   Larey Dresser, MD  cyclobenzaprine (FLEXERIL) 10 MG tablet Take 10 mg by mouth 3 (three) times daily as needed for muscle spasms.    Historical Provider, MD  dexlansoprazole Ross Marcus)  60 MG capsule Take 1 capsule (60 mg total) by mouth daily. 03/12/14   Alycia Rossetti, MD  Docusate Calcium (STOOL SOFTENER PO) Take 1-3 capsules by mouth daily as needed (constipation).    Historical Provider, MD  HYDROmorphone (DILAUDID) 4 MG tablet Take 1 tablet (4 mg total) by mouth 3 (three) times daily. 03/11/14   Alycia Rossetti, MD  insulin aspart (NOVOLOG) 100 UNIT/ML FlexPen 5 Units, Subcutaneous, 3 times daily with meals 02/07/14   Alycia Rossetti, MD  Insulin Detemir (LEVEMIR FLEXPEN) 100 UNIT/ML Pen Inject 20 Units into the skin every evening. 10/29/13   Amy D Clegg, NP  methylcellulose (ARTIFICIAL TEARS) 1 % ophthalmic solution Place 1 drop into both eyes at bedtime as needed (dry eyes).    Historical Provider, MD  midodrine (PROAMATINE) 10 MG tablet Take 5 mg by mouth 3 (three) times a week. Monday, Wednesday and Friday    Historical Provider, MD  multivitamin (RENA-VIT) TABS tablet Take 1 tablet by mouth daily.    Historical Provider, MD  OxyCODONE (OXYCONTIN) 20 mg T12A 12 hr tablet Take 1 tablet (20 mg total) by mouth every 12 (twelve) hours. 03/11/14   Alycia Rossetti, MD  polyethylene glycol [facility] / Floria Raveling) packet Take 17 g by mouth daily as needed (constipation). 10/29/13   Amy D Ninfa Meeker, NP  promethazine (PHENERGAN) 25 MG tablet Take 1 tablet (25 mg total) by mouth every 8 (eight) hours as needed for nausea or vomiting. 02/18/14   Alycia Rossetti, MD  sevelamer carbonate (RENVELA) 800 MG tablet Take 3 tablets (2,400  mg total) by mouth 3 (three) times daily with meals. 02/26/14   Oswald Hillock, MD  triamcinolone cream (KENALOG) 0.1 % Apply 1 application topically 2 (two) times daily. 02/18/14   Alycia Rossetti, MD  ursodiol (ACTIGALL) 300 MG capsule Take 1 capsule (300 mg total) by mouth 2 (two) times daily. 11/29/13   Ripudeep Krystal Eaton, MD   Triage Vitals: BP 105/72 mmHg  Pulse 130  Temp(Src) 99.2 F (37.3 C) (Oral)  Resp 16  Ht 5\' 7"  (1.702 m)  Wt 264 lb (119.75 kg)  BMI 41.34 kg/m2  SpO2 100%  LMP 09/17/2013 (Approximate)  Physical Exam  Constitutional: She is oriented to person, place, and time. She appears well-developed and well-nourished. No distress.  HENT:  Head: Normocephalic and atraumatic.  Eyes: Conjunctivae and EOM are normal.  Neck: Neck supple.  Cardiovascular: Normal rate, regular rhythm and normal heart sounds.   Pulmonary/Chest: Effort normal and breath sounds normal. No respiratory distress.  Musculoskeletal: Normal range of motion.  Neurological: She is alert and oriented to person, place, and time.  Skin: Skin is warm and dry.  Psychiatric: She has a normal mood and affect. Her behavior is normal.  Nursing note and vitals reviewed.   ED Course  Procedures (including critical care time)  DIAGNOSTIC STUDIES: Oxygen Saturation is 100% on room air, normal by my interpretation.    COORDINATION OF CARE: At 2336 Discussed treatment plan with patient. Patient agrees.   Labs Review Labs Reviewed - No data to display  Imaging Review No results found.   EKG Interpretation None      MDM   Final diagnoses:  Accidental drug ingestion, initial encounter    Accidental overdose. Not clinically severe. Bleeding precautions given.   I personally performed the services described in this documentation, which was scribed in my presence. The recorded information has been reviewed and is accurate.  [name], MD 03/20/14 [phone]

## 2014-03-19 NOTE — Patient Instructions (Signed)
START Carvedilol 3.125 mg one pill twice a day START Plavix 75 mg, one pill daily  A chest x-ray takes a picture of the organs and structures inside the chest, including the heart, lungs, and blood vessels. This test can show several things, including, whether the heart is enlarges; whether fluid is building up in the lungs; and whether pacemaker / defibrillator leads are still in place.  Your physician recommends that you schedule a follow-up appointment in: 2 months  Do the following things EVERYDAY: 1) Weigh yourself in the morning before breakfast. Write it down and keep it in a log. 2) Take your medicines as prescribed 3) Eat low salt foods-Limit salt (sodium) to 2000 mg per day.  4) Stay as active as you can everyday 5) Limit all fluids for the day to less than 2 liters 6)

## 2014-03-19 NOTE — ED Notes (Signed)
Pt alert & oriented x4. Patient given discharge instructions, paperwork & prescription(s). Patient  instructed to stop at the registration desk to finish any additional paperwork. Patient verbalized understanding. Pt left department in her wheelchair w/ no further questions.

## 2014-03-19 NOTE — ED Notes (Signed)
Patient reports was started on plavix today. States she accidentally took two doses tonight. Patient very drowsy in triage, states took her night time medication

## 2014-03-20 NOTE — Progress Notes (Signed)
Patient ID: Hannah [name], female   DOB: [birth year], 43 y.o.   MRN: [id number] PCP: Hannah [name]  Weight Range   Baseline proBNP    HPI: Hannah [name] is a 43 y/o woman with type I diabetes complicated by nephropathy, retinopathy and severe vascular disease. In 2013 she had a cardiac cath which showed moderate CAD and a mildly decreased EF. In mid May 2015, she was in the midst of workup for AV fistula surgery. An outpatient stress test was ordered. This was done on May 18 and revealed anterior and apical ischemia with an ejection fraction of 22%. She was referred for admission due to the high-risk nature of her stress test and progressive HF symptoms with volume overload. On admission, her creatinine was 4.1 and the decision was made to proceed with cardiac catheterization despite the risk of contrast nephropathy given the high-risk nature of her stress test and the fact that she was felt to be nearing ESRD anyway.   Cardiac cath was done on 5/18 revealed 3v CAD with diabetic vasculopathy. LVEDP was 38. Echo showed an EF 30-35%. She was also found to have bilateral carotid stenosis with a 60-79% lesion on the right and a high-grade 80-99% lesion on the left. She was seen by Dr. Prescott [name] for possible CABG not felt to be vacularizable. Several days post-cath she decompensated with respiratory failure in the setting of progressive volume overload and cardiogenic shock. She was started on inotropes and moved to the ICU and a Trialysis catheter was placed for CVVHD. Almost 40 pounds of fluid was removed. Unfortunately she became progressively hypotensive and had increased pressor requirements. On 5/28 she had a co-ox of 38% and was taken to the cath lab for RHC and placement of Swan Ganz catheter which conformed cardiogenic shock and an IABP was palced. With the IABP in place she developed an ischemic right foot and the pump had to be removed urgently. VVS was consulted. She was taken to the OR on 6/1 for  femoral thromboembolectomy and 4-compartment fasciotomy of her RLE. She was started on broad spectrum abx. Both her groin and facsciotomy wounds were healing slowly so wound vacs were placed and these were followed by VVS and wound care. She had several debridements. She continued to struggle with cardiogenic shock and profound hypotension and was maintained on very high dose pressors including milrinone and levophed at doses up to almost 156mcg/hr. Milrinone was eventually changed to dobutamine. On 6/3 she had VT/VF arrest and was intubated. Over the next several weeks she remained on CVVHD and we slowly and painstakingly weaned off her inotropes as her BP tolerated. (systolics often dipping into 70s). We started midodrine and titrated to 10 tid and also florinef 0.1 to help maintain her BP. She was unable to tolerate any b-blockers or ACEs due to hypotension. She finally was able to tolerate intermittent HD without any inotropic support and was moved to stepdown and later discharged home with [facility].    She was readmitted in 7/15 with cellulitis.  She was readmitted again in 8/15 with RUQ pain.  She was found to have multiple gallstones in her gallbladder by [name] and there was concern for acute cholecystitis.  However, the HIDA scan was negative and it was decided to manage her with ursodiol as she would be very high risk for surgery.  So far, she is doing ok.  No current abdominal pain.  She is able to eat without problems.  She was admitted again in  11/15 with abdominal wall cellulitis.    She has severe LICA stenosis, follows with Hannah [name].  Given access issues, she is not a good carotid stent candidate.  He would consider endarterectomy.   Echo (8/15) with EF 25-30% with wall motion abnormalities, mild RV dilation with moderate-severe RV systolic dysfunction, moderate MR (suspect infarct-related).    Patient is now home.  She is stable.  She is able to walk around her house but is short of breath with  longer distances.  She has bilateral shin pain that is limiting her ambulation.  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  These are painful.  She plans to start cardiac rehab.  She is tolerating hemodialysis.  No lightheadedness, BP is ok today.   ECG: sinus tachycardia with HR 128, slight ST depression inferiorly and anterolaterally  ROS: All systems negative except as listed in HPI, PMH and Problem List.  Past Medical History  Diagnosis Date  . High cholesterol   . Diabetic retinopathy   . Peripheral neuropathy     "tips of toes"  . Blind right eye   . CHF (congestive heart failure)   . CAD (coronary artery disease)   . GERD (gastroesophageal reflux disease)   . Hypertension   . History of lung cancer 07/2011    s/p left lower lobectomy  . Diabetes mellitus     IDDM  . Cataract of right eye   . Ulcer of toe of right foot 07/10/2012    great toe  . Breast calcification, left 06/2012  . Acute biphenotypic leukemia   . CKD (chronic kidney disease) stage 5, GFR less than 15 ml/min   . Nephrotic syndrome   . Gastritis     H/o gastritis on prior endoscopy  . Anemia   . Carotid artery disease   . Gallstones   . Myocardial infarction 5/16    Current Outpatient Prescriptions  Medication Sig Dispense Refill  . aspirin EC 81 MG EC tablet Take 1 tablet (81 mg total) by mouth daily. 30 tablet 0  . atorvastatin (LIPITOR) 80 MG tablet Take 1 tablet (80 mg total) by mouth daily at 6 PM. 30 tablet 6  . carvedilol (COREG) 3.125 MG tablet Take 1 tablet (3.125 mg total) by mouth 2 (two) times daily with a meal. 60 tablet 3  . cinacalcet (SENSIPAR) 30 MG tablet Take 1 tablet (30 mg total) by mouth daily with breakfast. 60 tablet   . clopidogrel (PLAVIX) 75 MG tablet Take 1 tablet (75 mg total) by mouth daily. 30 tablet 3  . cyclobenzaprine (FLEXERIL) 10 MG tablet Take 10 mg by mouth 3 (three) times daily as needed  for muscle spasms.    Marland Kitchen dexlansoprazole (DEXILANT) 60 MG capsule Take 1 capsule (60 mg total) by mouth daily. 30 capsule 3  . Docusate Calcium (STOOL SOFTENER PO) Take 1-3 capsules by mouth daily as needed (constipation).    Marland Kitchen HYDROmorphone (DILAUDID) 4 MG tablet Take 1 tablet (4 mg total) by mouth 3 (three) times daily. 30 tablet 0  . insulin aspart (NOVOLOG) 100 UNIT/ML FlexPen 5 Units, Subcutaneous, 3 times daily with meals 15 mL 11  . Insulin Detemir (LEVEMIR FLEXPEN) 100 UNIT/ML Pen Inject 20 Units into the skin every evening. 15 mL 11  . methylcellulose (ARTIFICIAL TEARS) 1 % ophthalmic solution Place 1 drop into both eyes at bedtime as needed (dry eyes).    . midodrine (  PROAMATINE) 10 MG tablet Take 5 mg by mouth 3 (three) times a week. Monday, Wednesday and Friday    . multivitamin (RENA-VIT) TABS tablet Take 1 tablet by mouth daily.    . OxyCODONE (OXYCONTIN) 20 mg T12A 12 hr tablet Take 1 tablet (20 mg total) by mouth every 12 (twelve) hours. 60 tablet 0  . polyethylene glycol (MIRALAX / GLYCOLAX) packet Take 17 g by mouth daily as needed (constipation). 14 each 6  . promethazine (PHENERGAN) 25 MG tablet Take 1 tablet (25 mg total) by mouth every 8 (eight) hours as needed for nausea or vomiting. 20 tablet 2  . sevelamer carbonate (RENVELA) 800 MG tablet Take 3 tablets (2,400 mg total) by mouth 3 (three) times daily with meals.    . triamcinolone cream (KENALOG) 0.1 % Apply 1 application topically 2 (two) times daily. 45 g 1  . ursodiol (ACTIGALL) 300 MG capsule Take 1 capsule (300 mg total) by mouth 2 (two) times daily. 60 capsule 4   No current facility-administered medications for this encounter.   PHYSICAL EXAM: Filed Vitals:   03/19/14 1550  BP: 104/72  Pulse: 132  Weight: 264 lb 1.6 oz (119.795 kg)  SpO2: 96%    General:  Well appearing. No resp difficulty Sitting wheelchair HEENT: normal Neck: supple. JVP 10-11. Carotids 2+ bilaterally; no bruits. No lymphadenopathy or  thryomegaly appreciated. Cor: PMI normal. Regular rate & rhythm. No rubs, gallops or murmurs. L dialysis catheter Lungs: Bilateral rhonchi Abdomen: obese, soft, nontender, nondistended. No hepatosplenomegaly. No bruits or masses. Good bowel sounds. Extremities: no cyanosis, clubbing, rash.  2+ edema to knees bilaterally.   Skin: Abdominal wall nodules Neuro: alert & orientedx3, cranial nerves grossly intact. Moves all 4 extremities w/o difficulty. Affect pleasant.  ASSESSMENT & PLAN: 1. Chronic Systolic Heart Failure:  Ischemic cardiomyopathy.  EF 25-30% with moderate to severe RV systolic dysfunction on echo in 8/15.  She appears volume overloaded on exam.   - She is doing fine just taking midodrine prior to HD.  Continue this for now.  - She needs more fluid off, needs her dry weight lowered if possible though may be a blood pressure issue.  - I will start her on Coreg 3.125 mg bid (hold on the mornings of HD).    - Eventually, would hope to get her on ACEI also. - She will need repeat echo in 3-4 months, will need to reassess clinical picture and consider ICD.   2. S/p VT arrest: Adding Coreg.  As above, will repeat echo in 3-4 months and reassess clinical picture to decide on ICD.  3. CAD: Severe 3 vessel disease, non-revascularizable.  No chest pain.  - Continue ASA 81 and atorvastatin.   - She is now off warfarin so will start Plavix 75 mg daily in addition to ASA 81.  - She is going to start cardiac rehab.  4. ESRD: On HD Mon-Wed Fri.  As above, would like dry weight lowered.  5. R arm DVT: Catheter-related.  Completed 3 months of coumadin.    6. Symptomatic cholelithiasis: Seems to have resolved for the time being.  Continue ursodiol.  She would be a high risk candidate for cholecystectomy but more stable now so probably could be done. 7. Carotid stenosis: Severe, asymptomatic LICA stenosis. Due to access issues, would not be good candidate for carotid stent.  Will have her followup in  2 months.  If she remains clinically stable, would consider CEA at that time.  8. Pulmonary:  Rhonchi on lung exam.  Needs CXR to rule out PNA.   Loralie Champagne 03/20/2014

## 2014-03-21 ENCOUNTER — Telehealth: Payer: Self-pay | Admitting: *Deleted

## 2014-03-21 ENCOUNTER — Encounter: Payer: Self-pay | Admitting: *Deleted

## 2014-03-21 NOTE — Telephone Encounter (Signed)
Received call from [place], [facility] SN with [facility]. [phone].  Reports that skin issues are continuing to spread and she now has redness and painful hardness to B upper thighs. Reports that redness is also noted on R inner thigh and stops about 4 finger widths above patient knees.   States that consultation with specialist is scheduled in January.   MD please advise.

## 2014-03-21 NOTE — Telephone Encounter (Signed)
Received letter from [place].   Reports that Oxycodone ER is not on patient insurance formulary, but brand name Oxycontin CR 20mg  is covered with no restrictions.   Ok to switch to name brand?

## 2014-03-22 ENCOUNTER — Telehealth: Payer: Self-pay | Admitting: Family Medicine

## 2014-03-22 MED ORDER — HYDROMORPHONE HCL 4 MG PO TABS: 4.0000 mg | ORAL_TABLET | Freq: Three times a day (TID) | ORAL | Status: DC

## 2014-03-22 NOTE — Telephone Encounter (Signed)
Call pt , she cant get until Monday, I want her to try to wean off the dilaudid that is why I started the oxycontin, try not to take the Dilaudid more than twice in a day,

## 2014-03-22 NOTE — Telephone Encounter (Signed)
Patient aware.   Prescription printed and patient made aware to come to office to pick up on Monday.

## 2014-03-22 NOTE — Telephone Encounter (Signed)
Okay to change to name brand

## 2014-03-22 NOTE — Telephone Encounter (Signed)
She is already getting treatment for this and is seeing a specialist, I do not advise anything else added at this time

## 2014-03-22 NOTE — Telephone Encounter (Signed)
MD please advise.   Last refill 03/11/2014, #30.

## 2014-03-22 NOTE — Telephone Encounter (Signed)
[phone]  Pt is needing a refill on HYDROmorphone (DILAUDID) 4 MG tablet

## 2014-03-22 NOTE — Telephone Encounter (Signed)
Call placed to patient. LMTRC.  

## 2014-03-25 NOTE — Telephone Encounter (Signed)
Returned call to [facility] SN.   SN made aware.

## 2014-03-26 ENCOUNTER — Telehealth: Payer: Self-pay | Admitting: *Deleted

## 2014-03-26 ENCOUNTER — Encounter (HOSPITAL_COMMUNITY): Admission: RE | Admit: 2014-03-26 | Payer: Medicare Other | Source: Ambulatory Visit

## 2014-03-26 NOTE — Telephone Encounter (Signed)
Agree call Dr. Redmond Pulling see if he can get a follow-up to review the biopsy site

## 2014-03-26 NOTE — Telephone Encounter (Signed)
Received call from [place], [facility] RN with [facility]. [phone].  Reports that patient has open area on abdomen that looks like it would benefit from debridement.   States that she is aware that appointment with specialist is next month, but [facility] RN recommended wound clinic referral for open area.   MD made aware and advised to have patient seen with [facility] Surgery, Dr. Greer Pickerel.  Will call office in AM.

## 2014-03-28 ENCOUNTER — Encounter (HOSPITAL_COMMUNITY): Payer: Self-pay | Admitting: Cardiology

## 2014-03-28 NOTE — Telephone Encounter (Signed)
Call placed to [facility].   Urgent appointment scheduled for 03/29/2014 @ 3pm.   Call placed to patient. [place].   Call placed to [facility] [place] aware of appointment and will call patient.   Will call office back with recommendations.

## 2014-03-29 ENCOUNTER — Telehealth: Payer: Self-pay | Admitting: Family Medicine

## 2014-03-29 NOTE — Telephone Encounter (Signed)
I spoke with Dr. 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 She also has erythema surrounding with the possibility of overlying cellulitis as well. He would not do anything to pride men on her secondary to the recurrent started present this would actually worsen and increase her risks of sepsis. At this point we are left with basic wound care pain medication and a possible another few doses of antibiotics with dialysis. This is a very poor prognosis

## 2014-03-29 NOTE — Telephone Encounter (Signed)
Patient returned call and states that she will be able to make appointment with surgeon.   Reports that she moved her dialysis hours up today.

## 2014-04-02 ENCOUNTER — Telehealth: Payer: Self-pay | Admitting: *Deleted

## 2014-04-02 ENCOUNTER — Telehealth: Payer: Self-pay | Admitting: Family Medicine

## 2014-04-02 MED ORDER — OXYCODONE HCL ER 30 MG PO T12A: 1.0000 | EXTENDED_RELEASE_TABLET | Freq: Two times a day (BID) | ORAL | Status: AC | PRN

## 2014-04-02 MED ORDER — HYDROMORPHONE HCL 4 MG PO TABS: 4.0000 mg | ORAL_TABLET | Freq: Three times a day (TID) | ORAL | Status: AC

## 2014-04-02 NOTE — Telephone Encounter (Signed)
I spoke with pt about the calciphylixis, I would not recommend another biopsy based on the current necrosis We will d/c antibiotics I spoke with patient letting her know that pain control and wound cares only thing that we can do at this time and she understands this. I also spoke with infectious disease they recommended discontinuing the antibiotics as there is no role for this. I also spoke with her nephrologist and they will make adjustments to her dialysis as well.  I will increase her OxyContin to 30 mg twice a day in which she will continue the dialogue as needed she is taking 3 times a day now which is an improvement

## 2014-04-02 NOTE — Telephone Encounter (Signed)
Noted, off Coreg for today See previous phone notes about active issues as well

## 2014-04-02 NOTE — Telephone Encounter (Signed)
Received call from [place], [facility] SN.   Reports that patient has stopped taking Coreg. Reports that patient noted to be hypotensive at dialysis (50/40).   Reports that patient BP now noted at 99/71, HR 100/110.  MD to be made aware.

## 2014-04-09 ENCOUNTER — Emergency Department (HOSPITAL_COMMUNITY): Payer: Medicare Other

## 2014-04-09 ENCOUNTER — Inpatient Hospital Stay (HOSPITAL_COMMUNITY)
Admission: EM | Admit: 2014-04-09 | Discharge: 2014-04-19 | DRG: 871 | Disposition: E | Payer: Medicare Other | Attending: Internal Medicine | Admitting: Internal Medicine

## 2014-04-09 ENCOUNTER — Encounter (HOSPITAL_COMMUNITY): Payer: Self-pay | Admitting: *Deleted

## 2014-04-09 DIAGNOSIS — E780 Pure hypercholesterolemia: Secondary | ICD-10-CM | POA: Diagnosis present

## 2014-04-09 DIAGNOSIS — Z6841 Body Mass Index (BMI) 40.0 and over, adult: Secondary | ICD-10-CM | POA: Diagnosis not present

## 2014-04-09 DIAGNOSIS — J9811 Atelectasis: Secondary | ICD-10-CM | POA: Diagnosis present

## 2014-04-09 DIAGNOSIS — I120 Hypertensive chronic kidney disease with stage 5 chronic kidney disease or end stage renal disease: Secondary | ICD-10-CM | POA: Diagnosis present

## 2014-04-09 DIAGNOSIS — Z7982 Long term (current) use of aspirin: Secondary | ICD-10-CM

## 2014-04-09 DIAGNOSIS — Z794 Long term (current) use of insulin: Secondary | ICD-10-CM

## 2014-04-09 DIAGNOSIS — R400 Somnolence: Secondary | ICD-10-CM

## 2014-04-09 DIAGNOSIS — L03115 Cellulitis of right lower limb: Secondary | ICD-10-CM | POA: Diagnosis present

## 2014-04-09 DIAGNOSIS — K7200 Acute and subacute hepatic failure without coma: Secondary | ICD-10-CM | POA: Diagnosis present

## 2014-04-09 DIAGNOSIS — N19 Unspecified kidney failure: Secondary | ICD-10-CM

## 2014-04-09 DIAGNOSIS — Z87891 Personal history of nicotine dependence: Secondary | ICD-10-CM

## 2014-04-09 DIAGNOSIS — H269 Unspecified cataract: Secondary | ICD-10-CM | POA: Diagnosis present

## 2014-04-09 DIAGNOSIS — N2581 Secondary hyperparathyroidism of renal origin: Secondary | ICD-10-CM | POA: Diagnosis present

## 2014-04-09 DIAGNOSIS — L97919 Non-pressure chronic ulcer of unspecified part of right lower leg with unspecified severity: Secondary | ICD-10-CM | POA: Diagnosis present

## 2014-04-09 DIAGNOSIS — Z7902 Long term (current) use of antithrombotics/antiplatelets: Secondary | ICD-10-CM | POA: Diagnosis not present

## 2014-04-09 DIAGNOSIS — E1122 Type 2 diabetes mellitus with diabetic chronic kidney disease: Secondary | ICD-10-CM | POA: Diagnosis present

## 2014-04-09 DIAGNOSIS — G9341 Metabolic encephalopathy: Secondary | ICD-10-CM | POA: Diagnosis present

## 2014-04-09 DIAGNOSIS — I6523 Occlusion and stenosis of bilateral carotid arteries: Secondary | ICD-10-CM | POA: Diagnosis present

## 2014-04-09 DIAGNOSIS — G9340 Encephalopathy, unspecified: Secondary | ICD-10-CM | POA: Diagnosis present

## 2014-04-09 DIAGNOSIS — I5042 Chronic combined systolic (congestive) and diastolic (congestive) heart failure: Secondary | ICD-10-CM | POA: Diagnosis present

## 2014-04-09 DIAGNOSIS — Z85118 Personal history of other malignant neoplasm of bronchus and lung: Secondary | ICD-10-CM

## 2014-04-09 DIAGNOSIS — I255 Ischemic cardiomyopathy: Secondary | ICD-10-CM | POA: Diagnosis present

## 2014-04-09 DIAGNOSIS — M7989 Other specified soft tissue disorders: Secondary | ICD-10-CM | POA: Diagnosis present

## 2014-04-09 DIAGNOSIS — E872 Acidosis: Secondary | ICD-10-CM | POA: Diagnosis present

## 2014-04-09 DIAGNOSIS — Z79891 Long term (current) use of opiate analgesic: Secondary | ICD-10-CM | POA: Diagnosis not present

## 2014-04-09 DIAGNOSIS — Z86718 Personal history of other venous thrombosis and embolism: Secondary | ICD-10-CM | POA: Diagnosis not present

## 2014-04-09 DIAGNOSIS — I2510 Atherosclerotic heart disease of native coronary artery without angina pectoris: Secondary | ICD-10-CM | POA: Diagnosis present

## 2014-04-09 DIAGNOSIS — L03116 Cellulitis of left lower limb: Secondary | ICD-10-CM | POA: Diagnosis present

## 2014-04-09 DIAGNOSIS — E1121 Type 2 diabetes mellitus with diabetic nephropathy: Secondary | ICD-10-CM | POA: Diagnosis present

## 2014-04-09 DIAGNOSIS — W19XXXA Unspecified fall, initial encounter: Secondary | ICD-10-CM | POA: Diagnosis present

## 2014-04-09 DIAGNOSIS — I248 Other forms of acute ischemic heart disease: Secondary | ICD-10-CM | POA: Diagnosis present

## 2014-04-09 DIAGNOSIS — E46 Unspecified protein-calorie malnutrition: Secondary | ICD-10-CM | POA: Diagnosis present

## 2014-04-09 DIAGNOSIS — Z992 Dependence on renal dialysis: Secondary | ICD-10-CM | POA: Diagnosis not present

## 2014-04-09 DIAGNOSIS — H5441 Blindness, right eye, normal vision left eye: Secondary | ICD-10-CM | POA: Diagnosis present

## 2014-04-09 DIAGNOSIS — D689 Coagulation defect, unspecified: Secondary | ICD-10-CM | POA: Diagnosis present

## 2014-04-09 DIAGNOSIS — E875 Hyperkalemia: Secondary | ICD-10-CM | POA: Diagnosis present

## 2014-04-09 DIAGNOSIS — R778 Other specified abnormalities of plasma proteins: Secondary | ICD-10-CM | POA: Diagnosis present

## 2014-04-09 DIAGNOSIS — R7989 Other specified abnormal findings of blood chemistry: Secondary | ICD-10-CM | POA: Diagnosis present

## 2014-04-09 DIAGNOSIS — L97509 Non-pressure chronic ulcer of other part of unspecified foot with unspecified severity: Secondary | ICD-10-CM | POA: Diagnosis present

## 2014-04-09 DIAGNOSIS — A419 Sepsis, unspecified organism: Secondary | ICD-10-CM | POA: Diagnosis present

## 2014-04-09 DIAGNOSIS — N186 End stage renal disease: Secondary | ICD-10-CM

## 2014-04-09 DIAGNOSIS — Z79899 Other long term (current) drug therapy: Secondary | ICD-10-CM

## 2014-04-09 DIAGNOSIS — I9589 Other hypotension: Secondary | ICD-10-CM | POA: Diagnosis present

## 2014-04-09 DIAGNOSIS — K219 Gastro-esophageal reflux disease without esophagitis: Secondary | ICD-10-CM | POA: Diagnosis present

## 2014-04-09 DIAGNOSIS — E662 Morbid (severe) obesity with alveolar hypoventilation: Secondary | ICD-10-CM | POA: Diagnosis present

## 2014-04-09 DIAGNOSIS — D631 Anemia in chronic kidney disease: Secondary | ICD-10-CM | POA: Diagnosis present

## 2014-04-09 DIAGNOSIS — R7401 Elevation of levels of liver transaminase levels: Secondary | ICD-10-CM | POA: Diagnosis present

## 2014-04-09 DIAGNOSIS — I252 Old myocardial infarction: Secondary | ICD-10-CM | POA: Diagnosis not present

## 2014-04-09 DIAGNOSIS — N390 Urinary tract infection, site not specified: Secondary | ICD-10-CM | POA: Diagnosis present

## 2014-04-09 DIAGNOSIS — I5022 Chronic systolic (congestive) heart failure: Secondary | ICD-10-CM

## 2014-04-09 DIAGNOSIS — E11319 Type 2 diabetes mellitus with unspecified diabetic retinopathy without macular edema: Secondary | ICD-10-CM | POA: Diagnosis present

## 2014-04-09 DIAGNOSIS — I214 Non-ST elevation (NSTEMI) myocardial infarction: Secondary | ICD-10-CM

## 2014-04-09 DIAGNOSIS — Z833 Family history of diabetes mellitus: Secondary | ICD-10-CM

## 2014-04-09 DIAGNOSIS — R945 Abnormal results of liver function studies: Secondary | ICD-10-CM

## 2014-04-09 DIAGNOSIS — Z87441 Personal history of nephrotic syndrome: Secondary | ICD-10-CM

## 2014-04-09 DIAGNOSIS — K8020 Calculus of gallbladder without cholecystitis without obstruction: Secondary | ICD-10-CM | POA: Diagnosis present

## 2014-04-09 DIAGNOSIS — R740 Nonspecific elevation of levels of transaminase and lactic acid dehydrogenase [LDH]: Secondary | ICD-10-CM

## 2014-04-09 DIAGNOSIS — Z856 Personal history of leukemia: Secondary | ICD-10-CM | POA: Diagnosis not present

## 2014-04-09 LAB — URINE MICROSCOPIC-ADD ON

## 2014-04-09 LAB — URINALYSIS, ROUTINE W REFLEX MICROSCOPIC
GLUCOSE, UA: 250 mg/dL — AB
Ketones, ur: 15 mg/dL — AB
Nitrite: POSITIVE — AB
PH: 5 (ref 5.0–8.0)
Protein, ur: >300 mg/dL — AB
Specific Gravity, Urine: 1.031 — ABNORMAL HIGH (ref 1.005–1.030)
Urobilinogen, UA: 1 mg/dL (ref 0.0–1.0)

## 2014-04-09 LAB — AMMONIA: AMMONIA: 74 umol/L — AB (ref 11–32)

## 2014-04-09 LAB — CBC WITH DIFFERENTIAL/PLATELET
Basophils Absolute: 0 10*3/uL (ref 0.0–0.1)
Basophils Relative: 0 % (ref 0–1)
Eosinophils Absolute: 0 10*3/uL (ref 0.0–0.7)
Eosinophils Relative: 0 % (ref 0–5)
HCT: 32.7 % — ABNORMAL LOW (ref 36.0–46.0)
Hemoglobin: 9.5 g/dL — ABNORMAL LOW (ref 12.0–15.0)
Lymphocytes Relative: 5 % — ABNORMAL LOW (ref 12–46)
Lymphs Abs: 0.6 10*3/uL — ABNORMAL LOW (ref 0.7–4.0)
MCH: 27.5 pg (ref 26.0–34.0)
MCHC: 29.1 g/dL — ABNORMAL LOW (ref 30.0–36.0)
MCV: 94.8 fL (ref 78.0–100.0)
Monocytes Absolute: 0.5 10*3/uL (ref 0.1–1.0)
Monocytes Relative: 4 % (ref 3–12)
Neutro Abs: 10.8 10*3/uL — ABNORMAL HIGH (ref 1.7–7.7)
Neutrophils Relative %: 91 % — ABNORMAL HIGH (ref 43–77)
PLATELETS: 449 10*3/uL — AB (ref 150–400)
RBC: 3.45 MIL/uL — ABNORMAL LOW (ref 3.87–5.11)
RDW: 19.3 % — AB (ref 11.5–15.5)
WBC: 11.9 10*3/uL — ABNORMAL HIGH (ref 4.0–10.5)

## 2014-04-09 LAB — I-STAT CHEM 8, ED
BUN: 74 mg/dL — AB (ref 6–23)
CHLORIDE: 103 meq/L (ref 96–112)
CREATININE: 7.4 mg/dL — AB (ref 0.50–1.10)
Calcium, Ion: 0.94 mmol/L — ABNORMAL LOW (ref 1.12–1.23)
GLUCOSE: 248 mg/dL — AB (ref 70–99)
HCT: 37 % (ref 36.0–46.0)
Hemoglobin: 12.6 g/dL (ref 12.0–15.0)
POTASSIUM: 6 mmol/L — AB (ref 3.5–5.1)
Sodium: 133 mmol/L — ABNORMAL LOW (ref 135–145)
TCO2: 13 mmol/L (ref 0–100)

## 2014-04-09 LAB — COMPREHENSIVE METABOLIC PANEL
ALT: 339 U/L — AB (ref 0–35)
AST: 904 U/L — ABNORMAL HIGH (ref 0–37)
Albumin: 1.9 g/dL — ABNORMAL LOW (ref 3.5–5.2)
Alkaline Phosphatase: 207 U/L — ABNORMAL HIGH (ref 39–117)
Anion gap: 21 — ABNORMAL HIGH (ref 5–15)
BUN: 77 mg/dL — ABNORMAL HIGH (ref 6–23)
CO2: 14 mmol/L — AB (ref 19–32)
Calcium: 7.9 mg/dL — ABNORMAL LOW (ref 8.4–10.5)
Chloride: 99 mEq/L (ref 96–112)
Creatinine, Ser: 8.01 mg/dL — ABNORMAL HIGH (ref 0.50–1.10)
GFR calc Af Amer: 6 mL/min — ABNORMAL LOW (ref >90)
GFR, EST NON AFRICAN AMERICAN: 5 mL/min — AB (ref >90)
Glucose, Bld: 263 mg/dL — ABNORMAL HIGH (ref 70–99)
Potassium: 6.3 mmol/L (ref 3.5–5.1)
Sodium: 134 mmol/L — ABNORMAL LOW (ref 135–145)
Total Bilirubin: 1.4 mg/dL — ABNORMAL HIGH (ref 0.3–1.2)
Total Protein: 6 g/dL (ref 6.0–8.3)

## 2014-04-09 LAB — CBC
HCT: 31.1 % — ABNORMAL LOW (ref 36.0–46.0)
HEMOGLOBIN: 9 g/dL — AB (ref 12.0–15.0)
MCH: 27 pg (ref 26.0–34.0)
MCHC: 28.9 g/dL — ABNORMAL LOW (ref 30.0–36.0)
MCV: 93.4 fL (ref 78.0–100.0)
Platelets: 420 10*3/uL — ABNORMAL HIGH (ref 150–400)
RBC: 3.33 MIL/uL — AB (ref 3.87–5.11)
RDW: 19.2 % — ABNORMAL HIGH (ref 11.5–15.5)
WBC: 12.9 10*3/uL — ABNORMAL HIGH (ref 4.0–10.5)

## 2014-04-09 LAB — CBG MONITORING, ED
GLUCOSE-CAPILLARY: 279 mg/dL — AB (ref 70–99)
GLUCOSE-CAPILLARY: 286 mg/dL — AB (ref 70–99)

## 2014-04-09 LAB — TROPONIN I: Troponin I: 1.19 ng/mL (ref <0.031)

## 2014-04-09 LAB — I-STAT CG4 LACTIC ACID, ED
LACTIC ACID, VENOUS: 4.35 mmol/L — AB (ref 0.5–2.2)
Lactic Acid, Venous: 2.81 mmol/L — ABNORMAL HIGH (ref 0.5–2.2)

## 2014-04-09 LAB — LIPASE, BLOOD: Lipase: 20 U/L (ref 11–59)

## 2014-04-09 LAB — BRAIN NATRIURETIC PEPTIDE: B Natriuretic Peptide: 1189.3 pg/mL — ABNORMAL HIGH (ref 0.0–100.0)

## 2014-04-09 LAB — GLUCOSE, RANDOM: Glucose, Bld: 283 mg/dL — ABNORMAL HIGH (ref 70–99)

## 2014-04-09 LAB — PREGNANCY, URINE: Preg Test, Ur: NEGATIVE

## 2014-04-09 MED ORDER — SODIUM POLYSTYRENE SULFONATE 15 GM/60ML PO SUSP
30.0000 g | Freq: Once | ORAL | Status: AC
  Administered 2014-04-09: 30 g via ORAL
  Filled 2014-04-09: qty 120

## 2014-04-09 MED ORDER — IOHEXOL 300 MG/ML  SOLN: 25.0000 mL | INTRAMUSCULAR | Status: AC

## 2014-04-09 MED ORDER — PIPERACILLIN-TAZOBACTAM 3.375 G IVPB 30 MIN
3.3750 g | Freq: Once | INTRAVENOUS | Status: AC
  Administered 2014-04-09: 3.375 g via INTRAVENOUS
  Filled 2014-04-09: qty 50

## 2014-04-09 MED ORDER — VANCOMYCIN HCL IN DEXTROSE 1-5 GM/200ML-% IV SOLN
1000.0000 mg | INTRAVENOUS | Status: DC
  Administered 2014-04-10 – 2014-04-12 (×2): 1000 mg via INTRAVENOUS
  Filled 2014-04-09 (×3): qty 200

## 2014-04-09 MED ORDER — SODIUM CHLORIDE 0.9 % IJ SOLN: 3.0000 mL | Freq: Two times a day (BID) | INTRAMUSCULAR | Status: DC

## 2014-04-09 MED ORDER — SODIUM CHLORIDE 0.9 % IJ SOLN: 3.0000 mL | INTRAMUSCULAR | Status: DC | PRN

## 2014-04-09 MED ORDER — INSULIN ASPART 100 UNIT/ML IV SOLN
5.0000 [IU] | Freq: Once | INTRAVENOUS | Status: AC
  Administered 2014-04-09: 5 [IU] via INTRAVENOUS
  Filled 2014-04-09: qty 1

## 2014-04-09 MED ORDER — ALBUTEROL SULFATE (2.5 MG/3ML) 0.083% IN NEBU
5.0000 mg | INHALATION_SOLUTION | Freq: Once | RESPIRATORY_TRACT | Status: AC
  Administered 2014-04-09: 5 mg via RESPIRATORY_TRACT
  Filled 2014-04-09: qty 6

## 2014-04-09 MED ORDER — SODIUM CHLORIDE 0.9 % IV BOLUS (SEPSIS)
1000.0000 mL | Freq: Once | INTRAVENOUS | Status: AC
  Administered 2014-04-09: 1000 mL via INTRAVENOUS

## 2014-04-09 MED ORDER — ALBUTEROL SULFATE (2.5 MG/3ML) 0.083% IN NEBU
10.0000 mg | INHALATION_SOLUTION | Freq: Once | RESPIRATORY_TRACT | Status: DC
  Filled 2014-04-09: qty 12

## 2014-04-09 MED ORDER — PIPERACILLIN-TAZOBACTAM IN DEX 2-0.25 GM/50ML IV SOLN
2.2500 g | Freq: Three times a day (TID) | INTRAVENOUS | Status: DC
  Administered 2014-04-10 – 2014-04-13 (×9): 2.25 g via INTRAVENOUS
  Filled 2014-04-09 (×12): qty 50

## 2014-04-09 MED ORDER — INSULIN DETEMIR 100 UNIT/ML ~~LOC~~ SOLN
20.0000 [IU] | Freq: Every evening | SUBCUTANEOUS | Status: DC
  Administered 2014-04-11 – 2014-04-12 (×2): 20 [IU] via SUBCUTANEOUS
  Filled 2014-04-09 (×8): qty 0.2

## 2014-04-09 MED ORDER — SODIUM BICARBONATE 8.4 % IV SOLN
50.0000 meq | Freq: Once | INTRAVENOUS | Status: AC
  Administered 2014-04-09: 50 meq via INTRAVENOUS
  Filled 2014-04-09: qty 50

## 2014-04-09 MED ORDER — DEXTROSE 50 % IV SOLN
1.0000 | Freq: Once | INTRAVENOUS | Status: AC
  Administered 2014-04-09: 50 mL via INTRAVENOUS
  Filled 2014-04-09: qty 50

## 2014-04-09 MED ORDER — VANCOMYCIN HCL IN DEXTROSE 1-5 GM/200ML-% IV SOLN: 1000.0000 mg | Freq: Once | INTRAVENOUS | Status: DC

## 2014-04-09 MED ORDER — MIDODRINE HCL 5 MG PO TABS
10.0000 mg | ORAL_TABLET | ORAL | Status: DC | PRN
  Filled 2014-04-09: qty 2

## 2014-04-09 MED ORDER — HEPARIN SODIUM (PORCINE) 5000 UNIT/ML IJ SOLN
5000.0000 [IU] | Freq: Three times a day (TID) | INTRAMUSCULAR | Status: DC
  Administered 2014-04-10: 5000 [IU] via SUBCUTANEOUS
  Filled 2014-04-09 (×4): qty 1

## 2014-04-09 MED ORDER — ASPIRIN EC 325 MG PO TBEC
325.0000 mg | DELAYED_RELEASE_TABLET | Freq: Every day | ORAL | Status: DC
  Filled 2014-04-09: qty 1

## 2014-04-09 MED ORDER — INSULIN ASPART 100 UNIT/ML ~~LOC~~ SOLN
0.0000 [IU] | SUBCUTANEOUS | Status: DC
  Administered 2014-04-10: 7 [IU] via SUBCUTANEOUS
  Administered 2014-04-10: 3 [IU] via SUBCUTANEOUS

## 2014-04-09 MED ORDER — SODIUM CHLORIDE 0.9 % IV SOLN: 250.0000 mL | INTRAVENOUS | Status: DC | PRN

## 2014-04-09 MED ORDER — VANCOMYCIN HCL IN DEXTROSE 1-5 GM/200ML-% IV SOLN
1000.0000 mg | Freq: Once | INTRAVENOUS | Status: AC
  Administered 2014-04-09: 1000 mg via INTRAVENOUS
  Filled 2014-04-09: qty 200

## 2014-04-09 NOTE — Consult Note (Signed)
CARDIOLOGY CONSULT NOTE   Patient ID: JIN SHOCKLEY MRN: [id number], DOB/AGE: 45-21-1972   Admit date: 03/22/2014 Date of Consult: 03/23/2014   Primary Physician: Vic Blackbird, MD Primary Cardiologist: Loralie Champagne, MD  Pt. Profile  43M with DM1 c/b ESRD (M/W/F), neuropathy, calciphylaxis, ischemic cardiomyopathy (EF 25-30%) without revasc options, diffuse atherosclerotic disease including b/l carotid stenosis who presents with weakness and altered mental status.   Problem List  Past Medical History  Diagnosis Date  . High cholesterol   . Diabetic retinopathy   . Peripheral neuropathy     "tips of toes"  . Blind right eye   . CHF (congestive heart failure)   . CAD (coronary artery disease)   . GERD (gastroesophageal reflux disease)   . Hypertension   . History of lung cancer 07/2011    s/p left lower lobectomy  . Diabetes mellitus     IDDM  . Cataract of right eye   . Ulcer of toe of right foot 07/10/2012    great toe  . Breast calcification, left 06/2012  . Acute biphenotypic leukemia   . CKD (chronic kidney disease) stage 5, GFR less than 15 ml/min   . Nephrotic syndrome   . Gastritis     H/o gastritis on prior endoscopy  . Anemia   . Carotid artery disease   . Gallstones   . Myocardial infarction 5/16    Past Surgical History  Procedure Laterality Date  . Cardiac catheterization  07/16/2011  . Incision and drainage breast abscess Left   . Tubal ligation  1994  . Vitrectomy  2010    2 on left, 1 on right  . Cesarean section  1991; 1994  . Video assisted thoracoscopy (vats)/ lobectomy Left 07/30/2011    left main thoracotomy, left lower lobectomy, mediastinal lymph node dissection  . Lobectomy    . Colonoscopy with esophagogastroduodenoscopy (egd) N/A 08/14/2012    Procedure: COLONOSCOPY WITH ESOPHAGOGASTRODUODENOSCOPY (EGD);  Surgeon: Danie Binder, MD;  [facility];  Service: Endoscopy;  Laterality: N/A;  10:45-moved to [address] to  notify pt  . Breast lumpectomy with needle localization Left 11/14/2012    Procedure: BREAST LUMPECTOMY WITH NEEDLE LOCALIZATION;  Surgeon: Marcello Moores A. Cornett, MD;  Location: [place];  Service: General;  Laterality: Left;  . Insertion of dialysis catheter Left 09/12/2013    Procedure: INSERTION OF DIALYSIS CATHETER;  Surgeon: Mal Misty, MD;  Location: [place];  Service: Vascular;  Laterality: Left;  . Embolectomy Right 09/17/2013    Procedure: Thrombectomy of Right Common Femoral Artery;  Surgeon: Elam Dutch, MD;  Location: [facility] OR;  Service: Vascular;  Laterality: Right;  . Endarterectomy femoral Right 09/17/2013    Procedure: Right Femoral Endarterectomy;  Surgeon: Elam Dutch, MD;  Location: [facility] OR;  Service: Vascular;  Laterality: Right;  . Patch angioplasty Right 09/17/2013    Procedure: Vein Patch Angioplasty of Right Femoral Artery;  Surgeon: Elam Dutch, MD;  Location: [place];  Service: Vascular;  Laterality: Right;  . Fasciotomy Right 09/17/2013    Procedure: Four Compartment Fasciotomy;  Surgeon: Elam Dutch, MD;  Location: [place];  Service: Vascular;  Laterality: Right;  . Fasciotomy closure Right 09/19/2013    Procedure: FASCIOTOMY CLOSURE;  Surgeon: Elam Dutch, MD;  Location: [place];  Service: Vascular;  Laterality: Right;  regional block and monitored anesthesia care used  . I&d extremity Right 09/28/2013    Procedure: IRRIGATION AND DEBRIDEMENT EXTREMITY;  Surgeon:  Serafina Mitchell, MD;  Location: [facility] OR;  Service: Vascular;  Laterality: Right;  . Application of wound vac Right 09/28/2013    Procedure: APPLICATION OF WOUND VAC;  Surgeon: Serafina Mitchell, MD;  Location: [place];  Service: Vascular;  Laterality: Right;  . Av fistula placement Right 01/03/2014    Procedure: ARTERIOVENOUS (AV) FISTULA CREATION;  Surgeon: Angelia Mould, MD;  Location: [place];  Service: Vascular;  Laterality: Right;  . Ligation of arteriovenous  fistula Right 01/04/2014    Procedure: LIGATION  OF ARTERIOVENOUS  FISTULA;  Surgeon: Rosetta Posner, MD;  Location: [place];  Service: Vascular;  Laterality: Right;  . Left heart catheterization with coronary angiogram N/A 09/04/2013    Procedure: LEFT HEART CATHETERIZATION WITH CORONARY ANGIOGRAM;  Surgeon: Peter M Martinique, MD;  Location: [facility] CATH LAB;  Service: Cardiovascular;  Laterality: N/A;  . Intra-aortic balloon pump insertion N/A 09/13/2013    Procedure: INTRA-AORTIC BALLOON PUMP INSERTION;  Surgeon: Jolaine Artist, MD;  Location: [facility] CATH LAB;  Service: Cardiovascular;  Laterality: N/A;  . Right heart catheterization  09/13/2013    Procedure: RIGHT HEART CATH;  Surgeon: Jolaine Artist, MD;  Location: [facility] CATH LAB;  Service: Cardiovascular;;     Allergies  Allergies  Allergen Reactions  . Crestor [Rosuvastatin] Other (See Comments)    Severe muscle weakness  . Nsaids Other (See Comments)    Not allergic, "bad on my kidneys"  . Ciprofloxacin Rash    HPI   43M with DM1 c/b ESRD (M/W/F), neuropathy, calciphylaxis, ischemic cardiomyopathy (EF 25-30%) without revasc options, diffuse atherosclerotic disease including b/l carotid stenosis who presents with weakness and altered mental status.   Per the patient's husband, Ms. Gombos sustained a mechanical fall (no head trauma) on Saturday. Sunday, she developed fatigue. On Monday, she developed progressive weakness of the b/l LE and RUE; it was to the point where she could not go to dialysis. They had planned for a make-up session today, but in the setting of progressive weakness (patient couldn't even lift her right arm) and altered mental status, she was brought to the ED. Husband denies the patient report CP, chest pressure, dyspnea. Denied any medication changes and states that except for the day of admission she took all as prescribed. States that until today she ate and drank normally. No fevers, chills, sweats. She does have multiple chronic wounds on her abdomen and anterior shins  bilaterally.   In the ED, was hemodynamically stable HR 117, BP 114/66, saturating 95% on 4L [place]. Labs were notable for K 6.3, Cr 8.01, BUN 77, AG 21, bicarb 14, AST 904, ALT 339, NH3 74, lactate 4.35, WBC 11.9 (91%PMN), TnI 1.19. UA was a suboptimal sample (many sqaums) but with many bacteria, pos nitrite, small LE, 7-10 WBCs, BNP 1189 (last on 09/06/13 was 28622).  CXR demonstrated cardiomegaly, L basilar atelectasis, mild vascular congestion without edema, no consolidation. Head CT demonstrated no acute pathology. CTAP demonstrated cholelithiasis, diffuse anasarca, and stranding within the soft tissues in the R inguinal region. 04/03/2014: ST, inferolateral ST depression, old lateral and anterior MI. Compared to prior on 03/19/14, the inferolateral ST depressions are worse. She was given vancomycin and zosyn and admitted to medicine. Cardiology was consulted for NSTEMI.    Inpatient Medications  . heparin  5,000 Units Subcutaneous 3 times per day  . [START ON 04/10/2014] insulin aspart  0-9 Units Subcutaneous 6 times per day  . Insulin Detemir  20 Units Subcutaneous QPM  .  sodium chloride  3 mL Intravenous Q12H  . sodium chloride  3 mL Intravenous Q12H    Family History Family History  Problem Relation Age of Onset  . Coronary artery disease Father   . Asthma Father   . COPD Father   . Hypertension Father   . Hyperlipidemia Father   . Diabetes Father   . Congestive Heart Failure Father   . Heart disease Father     before age 24  . Heart attack Father   . Peripheral vascular disease Father   . Hypertension Mother   . Hyperlipidemia Mother   . Diabetes Mother   . Cancer Mother   . Cancer Maternal Aunt      three aunts, bone, breast, ?  . Hypertension Brother   . Diabetes Brother   . Diabetes Sister   . Colon cancer Neg Hx   . Celiac disease Neg Hx   . Crohn's disease Neg Hx   . Ulcerative colitis Neg Hx   . Lung cancer Maternal Grandmother      Social History History    Social History  . Marital Status: Married    Spouse Name: N/A    Number of Children: 3  . Years of Education: N/A   Occupational History  . Not on file.   Social History Main Topics  . Smoking status: Former Smoker -- 2.00 packs/day for 30 years    Quit date: 08/01/2011  . Smokeless tobacco: Never Used  . Alcohol Use: No  . Drug Use: No  . Sexual Activity: Yes   Other Topics Concern  . Not on file   Social History Narrative     Review of Systems  Unable to obtain due to AMS  Physical Exam  Blood pressure 99/60, pulse 112, temperature 100 F (37.8 C), temperature source Rectal, resp. rate 7, height 5\' 7"  (1.702 m), weight 117.935 kg (260 lb), last menstrual period 09/17/2013, SpO2 99 %.  General: Somnolent, flushed Psych: Somnolent Neuro: Somnolent, occasionally following commands.  HEENT: Normal  Neck: Supple without JVD. Lungs:  Resp regular and unlabored, CTA. Heart: RRR no s3, s4, or murmurs. + friction rub. Abdomen: Soft, tender. Two large eschars (1.5x5 inches on left, 0.5x2 inches on right) with surrounding tissue edema and some erythema. Some increased warmth. No drainage.  Extremities: No clubbing, cyanosis. 1+ bilateral LE edema. Bilateral anterior tibial wounds with granulation tissue.   Labs   Recent Labs  03/23/2014 1850  TROPONINI 1.19*   Lab Results  Component Value Date   WBC 11.9* 04/14/2014   HGB 12.6 03/30/2014   HCT 37.0 04/02/2014   MCV 94.8 03/28/2014   PLT 449* 04/18/2014    Recent Labs Lab 03/25/2014 1550 04/07/2014 1806 04/12/2014 1850  NA 134* 133*  --   K 6.3* 6.0*  --   CL 99 103  --   CO2 14*  --   --   BUN 77* 74*  --   CREATININE 8.01* 7.40*  --   CALCIUM 7.9*  --   --   PROT 6.0  --   --   BILITOT 1.4*  --   --   ALKPHOS 207*  --   --   ALT 339*  --   --   AST 904*  --   --   GLUCOSE 263* 248* 283*   Lab Results  Component Value Date   CHOL 76 12/18/2013   HDL 25* 12/18/2013   LDLCALC 26 12/18/2013   TRIG 127  12/18/2013   No results found for: DDIMER  Radiology/Studies  Ct Abdomen Pelvis Wo Contrast  03/26/2014   CLINICAL DATA:  Fall 3 days ago.  Lethargy.  EXAM: CT ABDOMEN AND PELVIS WITHOUT CONTRAST  TECHNIQUE: Multidetector CT imaging of the abdomen and pelvis was performed following the standard protocol without IV contrast.  COMPARISON:  02/15/2014  FINDINGS: Lack of oral and intravenous contrast limits the exam. Motion artifact limits the exam. Overall, this is a severely limited examination.  Stable left lower lobe pulmonary nodule.  Stable left pleural effusion with features of loculation.  Stable small pericardial effusion.  Gallstones have developed.  Liver, spleen, pancreas are grossly within normal limits.  There is nonspecific stranding involving the left adrenal gland. Right adrenal gland is stable with some calcifications.  Lymph stones image gastrohepatic ligament and about the aorta are stable compatible with mild adenopathy.  Stable appearance of the kidneys.  Left renal cyst.  Bladder is decompressed.  There is stranding and soft tissue density within the right inguinal region which is stable. Stranding throughout the subcutaneous fat and intraperitoneal fat is present compatible with diffuse anasarca.  Atherosclerotic calcifications are present and stable  Uterus is unremarkable.  IMPRESSION: Cholelithiasis is now evident.  Diffuse anasarca.  Other findings are stable compared to the prior study. This includes a left lower lobe pulmonary nodule as well as abdominal adenopathy. There is also stranding within the soft tissues of the right inguinal region. An inflammatory process is not excluded.   Electronically Signed   By: Maryclare Bean M.D.   On: 04/13/2014 20:34   Dg Chest 2 View  03/20/2014   CLINICAL DATA:  Productive cough and back pain for 2 weeks. Evaluate for pneumonia.  EXAM: CHEST  2 VIEW  COMPARISON:  12/28/2013; 10/06/2013  FINDINGS: Grossly unchanged enlarged cardiac silhouette and  mediastinal contours. Stable positioning of support apparatus. Grossly unchanged bibasilar heterogeneous opacities, left greater than right, likely atelectasis. Pulmonary venous congestion without frank evidence of edema. Pleural parenchymal thickening about the bilateral major and the right minor fissures. No pleural effusion or pneumothorax. Unchanged bones.  IMPRESSION: 1. No acute cardiopulmonary disease. Specifically, no evidence of pneumonia. 2. Similar findings of cardiomegaly, pulmonary venous congestion and bibasilar atelectasis.   Electronically Signed   By: Sandi Mariscal M.D.   On: 03/20/2014 08:18   Ct Head Wo Contrast  04/12/2014   CLINICAL DATA:  Weakness  EXAM: CT HEAD WITHOUT CONTRAST  TECHNIQUE: Contiguous axial images were obtained from the base of the skull through the vertex without intravenous contrast.  COMPARISON:  07/28/2011  FINDINGS: No skull fracture is noted. Paranasal sinuses and mastoid air cells are unremarkable. Postsurgical high-density material noted right eye globe.  No intracranial hemorrhage, mass effect or midline shift. No acute cortical infarction. No mass lesion is noted on this unenhanced scan. The gray and white-matter differentiation is is preserved. Ventricular size is stable from prior exam.  IMPRESSION: No acute intracranial abnormality.  No significant change.   Electronically Signed   By: Lahoma Crocker M.D.   On: 04/02/2014 17:23   Dg Chest Port 1 View  (if Code Sepsis Called)  04/08/2014   CLINICAL DATA:  Lethargy, shortness of Breath  EXAM: PORTABLE CHEST - 1 VIEW  COMPARISON:  03/19/2014  FINDINGS: Cardiomegaly again noted. Dual lumen left IJ catheter is unchanged in position. Central mild vascular congestion without convincing pulmonary edema. Mild left basilar atelectasis. No segmental infiltrate.  IMPRESSION: Cardiomegaly .Mild left basilar atelectasis. No  segmental infiltrate. Central mild vascular congestion without pulmonary edema.   Electronically Signed    By: Lahoma Crocker M.D.   On: 04/03/2014 16:22   Study Conclusions  - Left ventricle: The cavity size was normal. Wall thickness was normal. Systolic function was severely reduced. The estimated ejection fraction was in the range of 25% to 30%. Basal segments are relatively preserved. The remainder of the LV shows severe hypokinesis to akinesis. Features are consistent with a pseudonormal left ventricular filling pattern, with concomitant abnormal relaxation and increased filling pressure (grade 2 diastolic dysfunction). E/medial e&' > 15 suggests LV end diastolic pressure at least 20 mmHg. - Aortic valve: There was no stenosis. - Mitral valve: There was at least moderate eccentric posteriorly-directed mitral regurgitation. Suspect that this is infarct-related MR in the setting of restriction of the posterior mitral leaflet. - Left atrium: The atrium was mildly dilated. - Right ventricle: The cavity size was mildly dilated. Systolic function was moderately to severely reduced. - Tricuspid valve: Peak RV-RA gradient (S): 27 mm Hg. - Pulmonary arteries: PA peak pressure: 35 mm Hg (S). - Systemic veins: IVC measured 2.0 cm with < 50% respirophasic variation, suggesting RA pressure 8 mmHg. - Pericardium, extracardiac: Pleural effusion noted. A trivial pericardial effusion was identified posterior to the heart.  Impressions:  - Normal LV size with severe systolic dysfunction, EF 27-06%. Wall motion abnormalities as noted above. Moderate diastolic dysfunction with evidence for elevated LV filling pressure. The RV was mildly dilated with moderate to severely decreased systolic function. At least moderate MR, probably infarct-related.  Cath 09/04/13 Left mainstem: Short, normal.   Left anterior descending (LAD): 80% ostial stenosis. 70-80% mid LAD. The mid LAD is diffusely diseased. The first diagonal is occluded. The second diagonal is very small  and occluded.  Left circumflex (LCx): The LCx is diffusely diseased in the mid vessel to 50%. The first OM is relatively small with diffuse 90% proximal. The second OM is larger with 50% disease.  Right coronary artery (RCA): 100% at the ostium. Left to right collaterals to the distal RCA  Left ventriculography: Not done  Final Conclusions:  1. Severe 3 vessel obstructive CAD. This has progressed from prior study in 2013. 2. Elevated LV filling pressures.  ECG  04/07/2014: ST, inferolateral ST depression, old lateral and anterior MI. Compared to prior on 03/19/14, the inferolateral ST depressions are worse.   ASSESSMENT   1. Sepsis, source unclear, complicated by shock liver, lactic acidosis, NSTEMI, altered mental status 2. Altered mental status in setting of infection, uremia, narcotic use, hepatic failure, ? hypercarbia 3. Uremia with AMS and pericardial rub due to missed dialysis sessions 4. NSTEMI, likely demand event. 5. CHF euvolemic to dry, weight 260lbs (down from 264lbs on 12/2), BNP much lower than previous values, lowest value in well over one year. Although she does not appear to be in cardiogenic shock at this point, she is at very high risk with any hemodynamic perturbation like sepsis.  6. Progressive LE and RUE weakness in the setting of the above, suspect related to toxic metabolic etiology  PLAN 1. Would gently administer 1L NS @ 150cc/hr given hypovolemia and sepsis 2. Check coags, particularly in light of liver failure. If she is developing a coagulopathy (INR >~2)she needs her aspirin and plavix to be held.  3. Hold atorvastatin given liver failure 4. Hold coreg given shock 5. Please check ABG given somnolence, low bicarb, renal failure 6. She has a rub and meets criteria for  urgent dialysis; she may require pressor support to allow this to occur.  7. Consider CCM consultation given multisystem organ failure and possible need for invasive hemodynamic  monitoring   Signed, Lamar Sprinkles, MD 03/29/2014, 10:38 PM

## 2014-04-09 NOTE — ED Notes (Signed)
Internal medicine at bedside

## 2014-04-09 NOTE — ED Notes (Signed)
Ct notified about pt transport to CT, states transport en route. Patient and family notified.

## 2014-04-09 NOTE — ED Notes (Signed)
NOTIFIED E. WEST ,PA FOR PATIENTS PANIC LAB RESULTS OF CG4+ LACTIC ACID =4.30mmol/L ,@16 :30 PM ,03/22/2014.

## 2014-04-09 NOTE — ED Notes (Signed)
CBG is 279. Notified nurse Raquel Sarna.

## 2014-04-09 NOTE — ED Notes (Signed)
NOTIFIED E. WE[facility] ,PA OF PATIENTS LAB RESULTS OF CG4+ LACTIC ACID @ 19:14 PM ,04/03/2014.

## 2014-04-09 NOTE — Progress Notes (Signed)
ANTIBIOTIC CONSULT NOTE - INITIAL  Pharmacy Consult for Vancomycin and Zosyn Indication: rule out sepsis  Allergies  Allergen Reactions  . Crestor [Rosuvastatin] Other (See Comments)    Severe muscle weakness  . Nsaids Other (See Comments)    Not allergic, "bad on my kidneys"  . Ciprofloxacin Rash    Patient Measurements: Height: 5\' 7"  (170.2 cm) Weight: 260 lb (117.935 kg) IBW/kg (Calculated) : 61.6 Adjusted Body Weight: 84 kg  Vital Signs: Temp: 100 F (37.8 C) (12/22 1604) Temp Source: Rectal (12/22 1604) BP: 99/60 mmHg (12/22 2145) Pulse Rate: 112 (12/22 2145) Intake/Output from previous day:   Intake/Output from this shift:    Labs:  Recent Labs  04/12/2014 1550 03/22/2014 1806  WBC 11.9*  --   HGB 9.5* 12.6  PLT 449*  --   CREATININE 8.01* 7.40*   Estimated Creatinine Clearance: 12.7 mL/min (by C-G formula based on Cr of 7.4). No results for input(s): VANCOTROUGH, VANCOPEAK, VANCORANDOM, GENTTROUGH, GENTPEAK, GENTRANDOM, TOBRATROUGH, TOBRAPEAK, TOBRARND, AMIKACINPEAK, AMIKACINTROU, AMIKACIN in the last 72 hours.   Microbiology: No results found for this or any previous visit (from the past 720 hour(s)).  Medical History: Past Medical History  Diagnosis Date  . High cholesterol   . Diabetic retinopathy   . Peripheral neuropathy     "tips of toes"  . Blind right eye   . CHF (congestive heart failure)   . CAD (coronary artery disease)   . GERD (gastroesophageal reflux disease)   . Hypertension   . History of lung cancer 07/2011    s/p left lower lobectomy  . Diabetes mellitus     IDDM  . Cataract of right eye   . Ulcer of toe of right foot 07/10/2012    great toe  . Breast calcification, left 06/2012  . Acute biphenotypic leukemia   . CKD (chronic kidney disease) stage 5, GFR less than 15 ml/min   . Nephrotic syndrome   . Gastritis     H/o gastritis on prior endoscopy  . Anemia   . Carotid artery disease   . Gallstones   . Myocardial infarction  5/16    Medications:   (Not in a hospital admission) Scheduled:  . heparin  5,000 Units Subcutaneous 3 times per day  . [START ON 04/10/2014] insulin aspart  0-9 Units Subcutaneous 6 times per day  . Insulin Detemir  20 Units Subcutaneous QPM  . sodium chloride  3 mL Intravenous Q12H  . sodium chloride  3 mL Intravenous Q12H   Infusions:  . sodium chloride     Assessment: 43yo female with ESRD on HD MWF presents with fatigue. Pharmacy is consulted to dose vancomycin and zosyn for sepsis. Pt is febrile to 100, WBC 11.9, sCr 7.4. Pt received zosyn 3.375g IV once in the ED at 1700 and vancomycin 1g IV once at 1800.  Goal of Therapy:  Vancomycin pre-HD target level 15-25 mcg/ml  Plan:  Vancomycin 2g IV load followed by 1000mg  IV qHD Zosyn 2.25g IV q8h Measure antibiotic drug levels at steady state Follow up culture results, HD plan, and clinical course  Andrey Cota. Diona Foley, PharmD Clinical Pharmacist Pager [phone] 04/03/2014,10:21 PM

## 2014-04-09 NOTE — ED Notes (Signed)
This RN never left pt. Side until this time.

## 2014-04-09 NOTE — ED Notes (Signed)
Potassium 6.3, Dr. Mingo Amber and Raquel Sarna PA aware.

## 2014-04-09 NOTE — ED Notes (Signed)
Pt. Tolerating CT scan well. RN's NS and EB accompanied patient.

## 2014-04-09 NOTE — ED Notes (Signed)
Raquel Sarna, ED PA at bedside.

## 2014-04-09 NOTE — ED Notes (Signed)
Pt. Arrived via [facility]. EMS from home. Pt reports feeling generalized weakness and reports having a fall up one step on Saturday. Pt. Has bruising over her lower abdomen that is overlying hardened areas. Pt reports having "calciumphylaxis". Pt. Appears very lethargic, and is alert and oriented x4.

## 2014-04-09 NOTE — H&P (Addendum)
Hospitalist Admission History and Physical  Patient name: Hannah [name] Medical record number: [id number] Date of birth: 26-May-1970 Age: 43 y.o. Gender: female  Primary Care Provider: Vic Blackbird, MD  Chief Complaint: sepsis, encephalopathy, UTI, anasarca, elevated troponin, transaminitis    History of Present Illness:This is a 43 y.o. year old female with significant past medical history of multiple medical problems including end-stage renal disease on dialysis Monday Wednesday and Friday, insulin-dependent diabetes, calciphylaxis, morbid obesity, coronary artery disease status post MI, ischemic cardiomyopathy, history of lung cancer status post lobectomy presenting with sepsis, encephalopathy, UTI, anasarca, elevated troponin, transaminitis. Husband reports patient with generalized malaise and weakness over the past 4-5 days. Was unable to go to dialysis on Friday secondary to weakness. Also missed dialysis yesterday. States that she fell down within the past one to 2 days. No direct head trauma. Has chronic abdominal pain has not worsened. Has noticed some redness and chronic ulcers on her abdomen that also have not change. Reports of biopsy to the left abdominal rash with some secondary mild eschar. No purulent drainage. EMS called home because of persistent lethargy. Presented to the ER temperature 100, heart rate in the 90s to 120s, respirations in the intensive 20s, blood pressure in the 90s to 140s. Satting 94% on room air. White blood cell count 11.9, hemoglobin 12.6, potassium 6.3. Creatinine 7.4. Bicarb 14.  AST 904, ALT 340, ALP 207, t bili 1.4. Troponin 1.19. EKG sinus tachycardia. Urinalysis indicative of infection. Was given 1 L normal saline bolus in the ER. Also given Kayexalate oral. Chest x-ray shows cardiomegaly and left basilar atelectasis. Pulmonary vascular congestion. Head CT within normal limits. CT of abdomen and pelvis shows diffuse anasarca, cholelithiasis stable  chronic findings including abdominal adenopathy and left lower lobe pulmonary nodule. I have asked ER physician assistant to consult both nephrology and cardiology.  Assessment and Plan: Hannah [name] is a 43 y.o. year old female presenting with sepsis, encephalopathy, UTI, anasarca, elevated troponin   Active Problems:   Sepsis   Encephalopathy   UTI (lower urinary tract infection)   Elevated troponin   1- encephalopathy -Likely multifactorial in setting of sepsis, UTI, toxic metabolic etiologies, end-stage renal disease, hepatic etiologies -Meets criteria for emergent/urgent dialysis -concominant lactic acidosis  -Formal nephrology consult pending -Vanco and Zosyn -Head CT within normal limits -Check ammonia level -Questionable right arm worsening weakness per her husband -MRI of the brain -Follow  2-sepsis -Suspect urinary source though there are multiple potential sources given skin findings of calciphylaxis and ulcers. No purulent drainage from lesions. -Husband reports fairly stable calciphylaxis lesions -Vanco and Zosyn empirically -Pan culture -Trend lactate -PCCM consult as clinically indicated   3-elevated troponin -Likely demand ischemia in the setting of above and baseline end-stage renal disease -Encephalopathic at baseline -No active reports of chest pain -Full dose aspirin 1 -Formal cardiology consult pending and my requests by ED physician assistant  4- Transaminitis -noted cholelithiasis without stenting or stricture on abd CT -? Cholecystitis vs. Ascending cholangitis  -tbili fairly stable  -uptrending LFTs above baseline in setting of sepsis -acute hepatitis panel, tylenol level  -trend  -cont broad spectrum coverage  -GI consult   5-end-stage renal disease -Has missed 2 episodes of hemodialysis -concominant lactic acidosis  -Volume overloaded clinically with concomitant encephalopathy -Meets urgent versus emergent hemodialysis  criteria -Formal nephrology consult pending (discussed emergent HD w/ renal overnight-deferred to am)  6-hyperkalemia -No peaked T waves on EKG -Status post Kayexalate and bicarbonate -Formal nephrology  consult pending -Telemetry bed -Follow potassium  7-insulin-dependent diabetes -Continue home Levemir -Sliding-scale insulin -A1c   FEN/GI: Nothing by mouth Prophylaxis: Subcutaneous heparin Disposition: Pending further evaluation Code Status: Full code   Patient Active Problem List   Diagnosis Date Noted  . Sepsis 03/19/2014  . Encephalopathy 03/28/2014  . UTI (lower urinary tract infection) 04/04/2014  . Panniculitis 03/11/2014  . Abdominal wall cellulitis 02/24/2014  . Sepsis affecting skin 02/24/2014  . Soft tissue mass 02/18/2014  . Rash and nonspecific skin eruption 02/18/2014  . Legally blind 02/18/2014  . Cellulitis, abdominal wall 02/03/2014  . Diabetes mellitus type 2, uncontrolled 01/29/2014  . Right arm pain 01/24/2014  . Type II diabetes mellitus with neurological manifestations 12/18/2013  . Peripheral vascular disease 12/17/2013  . Cardiomyopathy, ischemic- EF 25-30% July 2015 11/26/2013  . Anemia of chronic disease 11/25/2013  . Chronic combined systolic and diastolic CHF, NYHA class 3 11/07/2013  . Cellulitis of right lower extremity 11/06/2013  . ESRD (end stage renal disease) on dialysis 11/06/2013  . Myoclonus 10/02/2013  . Ventricular tachycardia 09/29/2013  . Right groin wound 09/26/2013  . DVT of axillary vein, acute right 09/20/2013  . Nonspecific abnormal unspecified cardiovascular function study   . Other primary cardiomyopathies   . Occlusion and stenosis of carotid artery without mention of cerebral infarction 08/21/2012  . GERD (gastroesophageal reflux disease) 08/08/2012  . Normocytic anemia 08/08/2012  . Gastroparesis 07/21/2012  . Tobacco abuse, in remission 05/05/2012  . Cerebrovascular disease 01/21/2012  . Venous insufficiency  10/12/2011  . Essential hypertension, benign 10/12/2011  . CAD- severe 3V CAD May 2015- turned down for CABG 08/30/2011  . Chronic toe ulcer 08/17/2011  . Diabetes 07/07/2011  . Hyperlipidemia 07/07/2011  . Adenocarcinoma of lung 06/09/2011  . Diabetic retinopathy associated with type 2 diabetes mellitus 06/09/2011   Past Medical History: Past Medical History  Diagnosis Date  . High cholesterol   . Diabetic retinopathy   . Peripheral neuropathy     "tips of toes"  . Blind right eye   . CHF (congestive heart failure)   . CAD (coronary artery disease)   . GERD (gastroesophageal reflux disease)   . Hypertension   . History of lung cancer 07/2011    s/p left lower lobectomy  . Diabetes mellitus     IDDM  . Cataract of right eye   . Ulcer of toe of right foot 07/10/2012    great toe  . Breast calcification, left 06/2012  . Acute biphenotypic leukemia   . CKD (chronic kidney disease) stage 5, GFR less than 15 ml/min   . Nephrotic syndrome   . Gastritis     H/o gastritis on prior endoscopy  . Anemia   . Carotid artery disease   . Gallstones   . Myocardial infarction 5/16    Past Surgical History: Past Surgical History  Procedure Laterality Date  . Cardiac catheterization  07/16/2011  . Incision and drainage breast abscess Left   . Tubal ligation  1994  . Vitrectomy  2010    2 on left, 1 on right  . Cesarean section  1991; 1994  . Video assisted thoracoscopy (vats)/ lobectomy Left 07/30/2011    left main thoracotomy, left lower lobectomy, mediastinal lymph node dissection  . Lobectomy    . Colonoscopy with esophagogastroduodenoscopy (egd) N/A 08/14/2012    Procedure: COLONOSCOPY WITH ESOPHAGOGASTRODUODENOSCOPY (EGD);  Surgeon: Danie Binder, MD;  [facility];  Service: Endoscopy;  Laterality: N/A;  10:45-moved to  [place] to notify pt  . Breast lumpectomy with needle localization Left 11/14/2012    Procedure: BREAST LUMPECTOMY WITH NEEDLE LOCALIZATION;   Surgeon: Marcello Moores A. Cornett, MD;  Location: [place];  Service: General;  Laterality: Left;  . Insertion of dialysis catheter Left 09/12/2013    Procedure: INSERTION OF DIALYSIS CATHETER;  Surgeon: Mal Misty, MD;  Location: [place];  Service: Vascular;  Laterality: Left;  . Embolectomy Right 09/17/2013    Procedure: Thrombectomy of Right Common Femoral Artery;  Surgeon: Elam Dutch, MD;  Location: [facility] OR;  Service: Vascular;  Laterality: Right;  . Endarterectomy femoral Right 09/17/2013    Procedure: Right Femoral Endarterectomy;  Surgeon: Elam Dutch, MD;  Location[facility] OR;  Service: Vascular;  Laterality: Right;  . Patch angioplasty Right 09/17/2013    Procedure: Vein Patch Angioplasty of Right Femoral Artery;  Surgeon: Elam Dutch, MD;  Location: [place];  Service: Vascular;  Laterality: Right;  . Fasciotomy Right 09/17/2013    Procedure: Four Compartment Fasciotomy;  Surgeon: Elam Dutch, MD;  Location: [place];  Service: Vascular;  Laterality: Right;  . Fasciotomy closure Right 09/19/2013    Procedure: FASCIOTOMY CLOSURE;  Surgeon: Elam Dutch, MD;  Location: [place];  Service: Vascular;  Laterality: Right;  regional block and monitored anesthesia care used  . I&d extremity Right 09/28/2013    Procedure: IRRIGATION AND DEBRIDEMENT EXTREMITY;  Surgeon: Serafina Mitchell, MD;  Location: [place];  Service: Vascular;  Laterality: Right;  . Application of wound vac Right 09/28/2013    Procedure: APPLICATION OF WOUND VAC;  Surgeon: Serafina Mitchell, MD;  Location: [place];  Service: Vascular;  Laterality: Right;  . Av fistula placement Right 01/03/2014    Procedure: ARTERIOVENOUS (AV) FISTULA CREATION;  Surgeon: Angelia Mould, MD;  Location: [place];  Service: Vascular;  Laterality: Right;  . Ligation of arteriovenous  fistula Right 01/04/2014    Procedure: LIGATION OF ARTERIOVENOUS  FISTULA;  Surgeon: Rosetta Posner, MD;  Location: [place];  Service: Vascular;  Laterality: Right;  . Left heart  catheterization with coronary angiogram N/A 09/04/2013    Procedure: LEFT HEART CATHETERIZATION WITH CORONARY ANGIOGRAM;  Surgeon: Peter M Martinique, MD;  Location: [facility] CATH LAB;  Service: Cardiovascular;  Laterality: N/A;  . Intra-aortic balloon pump insertion N/A 09/13/2013    Procedure: INTRA-AORTIC BALLOON PUMP INSERTION;  Surgeon: Jolaine Artist, MD;  Location: [facility] CATH LAB;  Service: Cardiovascular;  Laterality: N/A;  . Right heart catheterization  09/13/2013    Procedure: RIGHT HEART CATH;  Surgeon: Jolaine Artist, MD;  Location: [facility] CATH LAB;  Service: Cardiovascular;;    Social History: History   Social History  . Marital Status: Married    Spouse Name: N/A    Number of Children: 3  . Years of Education: N/A   Social History Main Topics  . Smoking status: Former Smoker -- 2.00 packs/day for 30 years    Quit date: 08/01/2011  . Smokeless tobacco: Never Used  . Alcohol Use: No  . Drug Use: No  . Sexual Activity: Yes   Other Topics Concern  . None   Social History Narrative    Family History: Family History  Problem Relation Age of Onset  . Coronary artery disease Father   . Asthma Father   . COPD Father   . Hypertension Father   . Hyperlipidemia Father   . Diabetes Father   . Congestive Heart Failure Father   .  Heart disease Father     before age 58  . Heart attack Father   . Peripheral vascular disease Father   . Hypertension Mother   . Hyperlipidemia Mother   . Diabetes Mother   . Cancer Mother   . Cancer Maternal Aunt      three aunts, bone, breast, ?  . Hypertension Brother   . Diabetes Brother   . Diabetes Sister   . Colon cancer Neg Hx   . Celiac disease Neg Hx   . Crohn's disease Neg Hx   . Ulcerative colitis Neg Hx   . Lung cancer Maternal Grandmother     Allergies: Allergies  Allergen Reactions  . Crestor [Rosuvastatin] Other (See Comments)    Severe muscle weakness  . Nsaids Other (See Comments)    Not allergic, "bad on my kidneys"   . Ciprofloxacin Rash    Current Facility-Administered Medications  Medication Dose Route Frequency Provider Last Rate Last Dose  . 0.9 %  sodium chloride infusion  250 mL Intravenous PRN Shanda Howells, MD      . heparin injection 5,000 Units  5,000 Units Subcutaneous 3 times per day Shanda Howells, MD      . Derrill Memo ON 04/10/2014] insulin aspart (novoLOG) injection 0-9 Units  0-9 Units Subcutaneous 6 times per day Shanda Howells, MD      . Insulin Detemir (LEVEMIR) FlexPen 20 Units  20 Units Subcutaneous QPM Shanda Howells, MD      . midodrine (PROAMATINE) tablet 10 mg  10 mg Oral Q dialysis Windy Kalata, MD      . sodium chloride 0.9 % injection 3 mL  3 mL Intravenous Q12H Shanda Howells, MD      . sodium chloride 0.9 % injection 3 mL  3 mL Intravenous Q12H Shanda Howells, MD      . sodium chloride 0.9 % injection 3 mL  3 mL Intravenous PRN Shanda Howells, MD       Current Outpatient Prescriptions  Medication Sig Dispense Refill  . aspirin EC 81 MG EC tablet Take 1 tablet (81 mg total) by mouth daily. 30 tablet 0  . atorvastatin (LIPITOR) 80 MG tablet Take 1 tablet (80 mg total) by mouth daily at 6 PM. 30 tablet 6  . carvedilol (COREG) 3.125 MG tablet Take 1 tablet (3.125 mg total) by mouth 2 (two) times daily with a meal. 60 tablet 3  . cinacalcet (SENSIPAR) 30 MG tablet Take 1 tablet (30 mg total) by mouth daily with breakfast. 60 tablet   . clopidogrel (PLAVIX) 75 MG tablet Take 1 tablet (75 mg total) by mouth daily. 30 tablet 3  . cyclobenzaprine (FLEXERIL) 10 MG tablet Take 10 mg by mouth 3 (three) times daily as needed for muscle spasms.    Marland Kitchen dexlansoprazole (DEXILANT) 60 MG capsule Take 1 capsule (60 mg total) by mouth daily. 30 capsule 3  . Docusate Calcium (STOOL SOFTENER PO) Take 1-3 capsules by mouth daily as needed (constipation).    . insulin aspart (NOVOLOG) 100 UNIT/ML FlexPen 5 Units, Subcutaneous, 3 times daily with meals 15 mL 11  . Insulin Detemir (LEVEMIR FLEXPEN) 100  UNIT/ML Pen Inject 20 Units into the skin every evening. 15 mL 11  . methylcellulose (ARTIFICIAL TEARS) 1 % ophthalmic solution Place 1 drop into both eyes at bedtime as needed (dry eyes).    . midodrine (PROAMATINE) 10 MG tablet Take 5 mg by mouth 3 (three) times a week. Monday, Wednesday and Friday    .  multivitamin (RENA-VIT) TABS tablet Take 1 tablet by mouth daily.    . OxyCODONE HCl ER 30 MG T12A Take 1 tablet by mouth 2 (two) times daily as needed. 60 each 0  . polyethylene glycol (MIRALAX / GLYCOLAX) packet Take 17 g by mouth daily as needed (constipation). 14 each 6  . promethazine (PHENERGAN) 25 MG tablet Take 1 tablet (25 mg total) by mouth every 8 (eight) hours as needed for nausea or vomiting. 20 tablet 2  . sevelamer carbonate (RENVELA) 800 MG tablet Take 3 tablets (2,400 mg total) by mouth 3 (three) times daily with meals.    . triamcinolone cream (KENALOG) 0.1 % Apply 1 application topically 2 (two) times daily. 45 g 1  . ursodiol (ACTIGALL) 300 MG capsule Take 1 capsule (300 mg total) by mouth 2 (two) times daily. 60 capsule 4  . HYDROmorphone (DILAUDID) 4 MG tablet Take 1 tablet (4 mg total) by mouth 3 (three) times daily. (Patient not taking: Reported on 03/30/2014) 60 tablet 0   Review Of Systems: 12 point ROS negative except as noted above in HPI.  Physical Exam: Filed Vitals:   04/04/2014 2145  BP: 99/60  Pulse: 112  Temp:   Resp: 7    General: Lethargic, encephalopathic, morbidly obese HEENT: PERRLA and extra ocular movement intact Heart: S1, S2 normal, no murmur, rub or gallop, regular rate and rhythm Lungs: Decreased breath sounds diffusely Abdomen: Obese abdomen, multiple calciphylaxis lesions on the abdomen, positive diffuse tenderness, positive stable eschar in left lower abdomen no active drainage Extremities: 2+ peripheral pulses, stable ulcer on right lower extremity. No purulent drainage Skin: As above Neurology: Minimally cooperative to exam  Labs and  Imaging: Lab Results  Component Value Date/Time   NA 133* 03/24/2014 06:06 PM   K 6.0* 03/27/2014 06:06 PM   CL 103 03/29/2014 06:06 PM   CO2 14* 04/12/2014 03:50 PM   BUN 74* 04/17/2014 06:06 PM   CREATININE 7.40* 04/02/2014 06:06 PM   CREATININE 4.67* 02/14/2014 04:30 PM   GLUCOSE 283* 03/30/2014 06:50 PM   Lab Results  Component Value Date   WBC 11.9* 04/05/2014   HGB 12.6 03/23/2014   HCT 37.0 04/06/2014   MCV 94.8 03/26/2014   PLT 449* 03/25/2014   Urinalysis    Component Value Date/Time   COLORURINE RED* 04/07/2014 1820   APPEARANCEUR CLOUDY* 04/01/2014 1820   LABSPEC 1.031* 04/03/2014 1820   PHURINE 5.0 04/03/2014 1820   GLUCOSEU 250* 04/01/2014 1820   HGBUR MODERATE* 03/23/2014 1820   BILIRUBINUR MODERATE* 04/14/2014 1820   KETONESUR 15* 03/31/2014 1820   PROTEINUR >300* 04/13/2014 1820   UROBILINOGEN 1.0 04/01/2014 1820   NITRITE POSITIVE* 03/31/2014 1820   LEUKOCYTESUR SMALL* 04/14/2014 1820       Ct Abdomen Pelvis Wo Contrast  04/02/2014   CLINICAL DATA:  Fall 3 days ago.  Lethargy.  EXAM: CT ABDOMEN AND PELVIS WITHOUT CONTRAST  TECHNIQUE: Multidetector CT imaging of the abdomen and pelvis was performed following the standard protocol without IV contrast.  COMPARISON:  02/15/2014  FINDINGS: Lack of oral and intravenous contrast limits the exam. Motion artifact limits the exam. Overall, this is a severely limited examination.  Stable left lower lobe pulmonary nodule.  Stable left pleural effusion with features of loculation.  Stable small pericardial effusion.  Gallstones have developed.  Liver, spleen, pancreas are grossly within normal limits.  There is nonspecific stranding involving the left adrenal gland. Right adrenal gland is stable with some calcifications.  Lymph stones  image gastrohepatic ligament and about the aorta are stable compatible with mild adenopathy.  Stable appearance of the kidneys.  Left renal cyst.  Bladder is decompressed.  There is  stranding and soft tissue density within the right inguinal region which is stable. Stranding throughout the subcutaneous fat and intraperitoneal fat is present compatible with diffuse anasarca.  Atherosclerotic calcifications are present and stable  Uterus is unremarkable.  IMPRESSION: Cholelithiasis is now evident.  Diffuse anasarca.  Other findings are stable compared to the prior study. This includes a left lower lobe pulmonary nodule as well as abdominal adenopathy. There is also stranding within the soft tissues of the right inguinal region. An inflammatory process is not excluded.   Electronically Signed   By: Maryclare Bean M.D.   On: 03/28/2014 20:34   Ct Head Wo Contrast  03/19/2014   CLINICAL DATA:  Weakness  EXAM: CT HEAD WITHOUT CONTRAST  TECHNIQUE: Contiguous axial images were obtained from the base of the skull through the vertex without intravenous contrast.  COMPARISON:  07/28/2011  FINDINGS: No skull fracture is noted. Paranasal sinuses and mastoid air cells are unremarkable. Postsurgical high-density material noted right eye globe.  No intracranial hemorrhage, mass effect or midline shift. No acute cortical infarction. No mass lesion is noted on this unenhanced scan. The gray and white-matter differentiation is is preserved. Ventricular size is stable from prior exam.  IMPRESSION: No acute intracranial abnormality.  No significant change.   Electronically Signed   By: Lahoma Crocker M.D.   On: 03/24/2014 17:23   Dg Chest Port 1 View  (if Code Sepsis Called)  03/22/2014   CLINICAL DATA:  Lethargy, shortness of Breath  EXAM: PORTABLE CHEST - 1 VIEW  COMPARISON:  03/19/2014  FINDINGS: Cardiomegaly again noted. Dual lumen left IJ catheter is unchanged in position. Central mild vascular congestion without convincing pulmonary edema. Mild left basilar atelectasis. No segmental infiltrate.  IMPRESSION: Cardiomegaly .Mild left basilar atelectasis. No segmental infiltrate. Central mild vascular congestion  without pulmonary edema.   Electronically Signed   By: Lahoma Crocker M.D.   On: 04/06/2014 16:22           Shanda Howells MD  Pager: [phone]

## 2014-04-09 NOTE — ED Provider Notes (Addendum)
CSN: [id number]     Arrival date & time 04/17/2014  1534 History   First MD Initiated Contact with Patient 03/24/2014 1603     Chief Complaint  Patient presents with  . Fatigue     (Consider location/radiation/quality/duration/timing/severity/associated sxs/prior Treatment) The history is provided by the patient, medical records and a significant other.     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  Husband notes 3 nights ago she was walking up the stairs and her legs went out from under her, she fell to her knees and c/o hip pain afterwards but otherwise seemed fine that night and the next day.  Yesterday he notes she was completely unable to walk or even assist with transfer to and from wheelchair, was unable to go to dialysis.  Denies known fevers, chest pain, SOB, recent URI symptoms, N/V/D, dysuria, vaginal or bowel symptoms.  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 Husband notes the abdomen and legs look unchanged x months. Pt notes her legs seem more swollen than usual. Also notes increase in abdominal tenderness and mild sore throat.  Husband denies any confusion or disorientation.    Past Medical History  Diagnosis Date  . High cholesterol   . Diabetic retinopathy   . Peripheral neuropathy     "tips of toes"  . Blind right eye   . CHF (congestive heart failure)   . CAD (coronary artery disease)   . GERD (gastroesophageal reflux disease)   . Hypertension   . History of lung cancer 07/2011    s/p left lower lobectomy  . Diabetes mellitus     IDDM  . Cataract of right eye   . Ulcer of toe of right foot 07/10/2012    great toe  . Breast calcification, left 06/2012  . Acute  biphenotypic leukemia   . CKD (chronic kidney disease) stage 5, GFR less than 15 ml/min   . Nephrotic syndrome   . Gastritis     H/o gastritis on prior endoscopy  . Anemia   . Carotid artery disease   . Gallstones   . Myocardial infarction 5/16   Past Surgical History  Procedure Laterality Date  . Cardiac catheterization  07/16/2011  . Incision and drainage breast abscess Left   . Tubal ligation  1994  . Vitrectomy  2010    2 on left, 1 on right  . Cesarean section  1991; 1994  . Video assisted thoracoscopy (vats)/ lobectomy Left 07/30/2011    left main thoracotomy, left lower lobectomy, mediastinal lymph node dissection  . Lobectomy    . Colonoscopy with esophagogastroduodenoscopy (egd) N/A 08/14/2012    Procedure: COLONOSCOPY WITH ESOPHAGOGASTRODUODENOSCOPY (EGD);  Surgeon: Danie Binder, MD;  [facility];  Service: Endoscopy;  Laterality: N/A;  10:45-moved to [address] to notify pt  . Breast lumpectomy with needle localization Left 11/14/2012    Procedure: BREAST LUMPECTOMY WITH NEEDLE LOCALIZATION;  Surgeon: Marcello Moores A. Cornett, MD;  Location: [place];  Service: General;  Laterality: Left;  . Insertion of dialysis catheter Left 09/12/2013    Procedure: INSERTION OF DIALYSIS CATHETER;  Surgeon: Mal Misty, MD;  Location: [place];  Service: Vascular;  Laterality: Left;  .  Embolectomy Right 09/17/2013    Procedure: Thrombectomy of Right Common Femoral Artery;  Surgeon: Elam Dutch, MD;  Location: [facility] OR;  Service: Vascular;  Laterality: Right;  . Endarterectomy femoral Right 09/17/2013    Procedure: Right Femoral Endarterectomy;  Surgeon: Elam Dutch, MD;  Location: [facility] OR;  Service: Vascular;  Laterality: Right;  . Patch angioplasty Right 09/17/2013    Procedure: Vein Patch Angioplasty of Right Femoral Artery;  Surgeon: Elam Dutch, MD;  Location: [place];  Service: Vascular;  Laterality: Right;  . Fasciotomy Right 09/17/2013    Procedure: Four Compartment  Fasciotomy;  Surgeon: Elam Dutch, MD;  Location: [place];  Service: Vascular;  Laterality: Right;  . Fasciotomy closure Right 09/19/2013    Procedure: FASCIOTOMY CLOSURE;  Surgeon: Elam Dutch, MD;  Location: [place];  Service: Vascular;  Laterality: Right;  regional block and monitored anesthesia care used  . I&d extremity Right 09/28/2013    Procedure: IRRIGATION AND DEBRIDEMENT EXTREMITY;  Surgeon: Serafina Mitchell, MD;  Location: [place];  Service: Vascular;  Laterality: Right;  . Application of wound vac Right 09/28/2013    Procedure: APPLICATION OF WOUND VAC;  Surgeon: Serafina Mitchell, MD;  Location: [place];  Service: Vascular;  Laterality: Right;  . Av fistula placement Right 01/03/2014    Procedure: ARTERIOVENOUS (AV) FISTULA CREATION;  Surgeon: Angelia Mould, MD;  Location: [place];  Service: Vascular;  Laterality: Right;  . Ligation of arteriovenous  fistula Right 01/04/2014    Procedure: LIGATION OF ARTERIOVENOUS  FISTULA;  Surgeon: Rosetta Posner, MD;  Location: [place];  Service: Vascular;  Laterality: Right;  . Left heart catheterization with coronary angiogram N/A 09/04/2013    Procedure: LEFT HEART CATHETERIZATION WITH CORONARY ANGIOGRAM;  Surgeon: Peter M Martinique, MD;  Location: [facility] CATH LAB;  Service: Cardiovascular;  Laterality: N/A;  . Intra-aortic balloon pump insertion N/A 09/13/2013    Procedure: INTRA-AORTIC BALLOON PUMP INSERTION;  Surgeon: Jolaine Artist, MD;  Location: [facility] CATH LAB;  Service: Cardiovascular;  Laterality: N/A;  . Right heart catheterization  09/13/2013    Procedure: RIGHT HEART CATH;  Surgeon: Jolaine Artist, MD;  Location: [facility] CATH LAB;  Service: Cardiovascular;;   Family History  Problem Relation Age of Onset  . Coronary artery disease Father   . Asthma Father   . COPD Father   . Hypertension Father   . Hyperlipidemia Father   . Diabetes Father   . Congestive Heart Failure Father   . Heart disease Father     before age 30  . Heart attack  Father   . Peripheral vascular disease Father   . Hypertension Mother   . Hyperlipidemia Mother   . Diabetes Mother   . Cancer Mother   . Cancer Maternal Aunt      three aunts, bone, breast, ?  . Hypertension Brother   . Diabetes Brother   . Diabetes Sister   . Colon cancer Neg Hx   . Celiac disease Neg Hx   . Crohn's disease Neg Hx   . Ulcerative colitis Neg Hx   . Lung cancer Maternal Grandmother    History  Substance Use Topics  . Smoking status: Former Smoker -- 2.00 packs/day for 30 years    Quit date: 08/01/2011  . Smokeless tobacco: Never Used  . Alcohol Use: No   OB History    No data available     Review of Systems  All other systems reviewed and  are negative.     Allergies  Crestor; Nsaids; and Ciprofloxacin  Home Medications   Prior to Admission medications   Medication Sig Start Date End Date Taking? Authorizing Provider  aspirin EC 81 MG EC tablet Take 1 tablet (81 mg total) by mouth daily. 08/17/13   Belkys A Regalado, MD  atorvastatin (LIPITOR) 80 MG tablet Take 1 tablet (80 mg total) by mouth daily at 6 PM. 10/29/13   Amy D Clegg, NP  carvedilol (COREG) 3.125 MG tablet Take 1 tablet (3.125 mg total) by mouth 2 (two) times daily with a meal. 03/19/14   Larey Dresser, MD  cinacalcet (SENSIPAR) 30 MG tablet Take 1 tablet (30 mg total) by mouth daily with breakfast. 02/26/14   Oswald Hillock, MD  clopidogrel (PLAVIX) 75 MG tablet Take 1 tablet (75 mg total) by mouth daily. 03/19/14   Larey Dresser, MD  cyclobenzaprine (FLEXERIL) 10 MG tablet Take 10 mg by mouth 3 (three) times daily as needed for muscle spasms.    Historical Provider, MD  dexlansoprazole (DEXILANT) 60 MG capsule Take 1 capsule (60 mg total) by mouth daily. 03/12/14   Alycia Rossetti, MD  Docusate Calcium (STOOL SOFTENER PO) Take 1-3 capsules by mouth daily as needed (constipation).    Historical Provider, MD  HYDROmorphone (DILAUDID) 4 MG tablet Take 1 tablet (4 mg total) by mouth 3 (three)  times daily. 04/02/14   Alycia Rossetti, MD  insulin aspart (NOVOLOG) 100 UNIT/ML FlexPen 5 Units, Subcutaneous, 3 times daily with meals 02/07/14   Alycia Rossetti, MD  Insulin Detemir (LEVEMIR FLEXPEN) 100 UNIT/ML Pen Inject 20 Units into the skin every evening. 10/29/13   Amy D Clegg, NP  methylcellulose (ARTIFICIAL TEARS) 1 % ophthalmic solution Place 1 drop into both eyes at bedtime as needed (dry eyes).    Historical Provider, MD  midodrine (PROAMATINE) 10 MG tablet Take 5 mg by mouth 3 (three) times a week. Monday, Wednesday and Friday    Historical Provider, MD  multivitamin (RENA-VIT) TABS tablet Take 1 tablet by mouth daily.    Historical Provider, MD  OxyCODONE HCl ER 30 MG T12A Take 1 tablet by mouth 2 (two) times daily as needed. 04/02/14   Alycia Rossetti, MD  polyethylene glycol [facility] Floria Raveling) packet Take 17 g by mouth daily as needed (constipation). 10/29/13   Amy D Ninfa Meeker, NP  promethazine (PHENERGAN) 25 MG tablet Take 1 tablet (25 mg total) by mouth every 8 (eight) hours as needed for nausea or vomiting. 02/18/14   Alycia Rossetti, MD  sevelamer carbonate (RENVELA) 800 MG tablet Take 3 tablets (2,400 mg total) by mouth 3 (three) times daily with meals. 02/26/14   Oswald Hillock, MD  triamcinolone cream (KENALOG) 0.1 % Apply 1 application topically 2 (two) times daily. 02/18/14   Alycia Rossetti, MD  ursodiol (ACTIGALL) 300 MG capsule Take 1 capsule (300 mg total) by mouth 2 (two) times daily. 11/29/13   Ripudeep Krystal Eaton, MD   BP 118/77 mmHg  Pulse 116  Temp(Src) 100 F (37.8 C) (Rectal)  Resp 13  Ht 5\' 7"  (1.702 m)  Wt 260 lb (117.935 kg)  BMI 40.71 kg/m2  SpO2 99%  LMP 09/17/2013 (Approximate) Physical Exam  Constitutional: She appears well-developed and well-nourished. She is easily aroused. She has a sickly appearance. She appears ill. No distress.  HENT:  Head: Normocephalic and atraumatic.  Mouth/Throat: Mucous membranes are dry.  Neck: Neck supple.  Cardiovascular: Normal rate and regular rhythm.   Pulmonary/Chest: Effort normal and breath sounds normal. No respiratory distress. She has no wheezes. She has no rales.  Abdominal: Soft. She exhibits no distension. There is tenderness. There is no rebound and no guarding.    Abdomen with multiple areas of erythema, edema, warmth, tenderness.  One large eschar as illustrated.  Separate scabbed area without discharge.  Musculoskeletal: She exhibits edema (bilateral lower extremity pitting edema).  Neurological: She is easily aroused.  Oriented to person, place, reason for being here, and very close to date (one day off)  Skin: She is not diaphoretic.  Bilateral lower extremities with chronic wounds (unchanged visually per husband)  Nursing note and vitals reviewed.   ED Course  Procedures (including critical care time) Labs Review Labs Reviewed  CBC WITH DIFFERENTIAL - Abnormal; Notable for the following:    WBC 11.9 (*)    RBC 3.45 (*)    Hemoglobin 9.5 (*)    HCT 32.7 (*)    MCHC 29.1 (*)    RDW 19.3 (*)    Platelets 449 (*)    Neutrophils Relative % 91 (*)    Neutro Abs 10.8 (*)    Lymphocytes Relative 5 (*)    Lymphs Abs 0.6 (*)    All other components within normal limits  COMPREHENSIVE METABOLIC PANEL - Abnormal; Notable for the following:    Sodium 134 (*)    Potassium 6.3 (*)    CO2 14 (*)    Glucose, Bld 263 (*)    BUN 77 (*)    Creatinine, Ser 8.01 (*)    Calcium 7.9 (*)    Albumin 1.9 (*)    AST 904 (*)    ALT 339 (*)    Alkaline Phosphatase 207 (*)    Total Bilirubin 1.4 (*)    GFR calc non Af Amer 5 (*)    GFR calc Af Amer 6 (*)    Anion gap 21 (*)    All other components within normal limits  URINALYSIS, ROUTINE W REFLEX MICROSCOPIC - Abnormal; Notable for the following:    Color, Urine RED (*)    APPearance CLOUDY (*)    Specific Gravity, Urine 1.031 (*)    Glucose, UA 250 (*)    Hgb urine dipstick MODERATE (*)    Bilirubin Urine MODERATE (*)     Ketones, ur 15 (*)    Protein, ur >300 (*)    Nitrite POSITIVE (*)    Leukocytes, UA SMALL (*)    All other components within normal limits  TROPONIN I - Abnormal; Notable for the following:    Troponin I 1.19 (*)    All other components within normal limits  BRAIN NATRIURETIC PEPTIDE - Abnormal; Notable for the following:    B Natriuretic Peptide 1189.3 (*)    All other components within normal limits  GLUCOSE, RANDOM - Abnormal; Notable for the following:    Glucose, Bld 283 (*)    All other components within normal limits  URINE MICROSCOPIC-ADD ON - Abnormal; Notable for the following:    Squamous Epithelial / LPF MANY (*)    Bacteria, UA MANY (*)    All other components within normal limits  AMMONIA - Abnormal; Notable for the following:    Ammonia 74 (*)    All other components within normal limits  CBC - Abnormal; Notable for the following:    WBC 12.9 (*)    RBC 3.33 (*)    Hemoglobin 9.0 (*)  HCT 31.1 (*)    MCHC 28.9 (*)    RDW 19.2 (*)    Platelets 420 (*)    All other components within normal limits  I-STAT CG4 LACTIC ACID, ED - Abnormal; Notable for the following:    Lactic Acid, Venous 4.35 (*)    All other components within normal limits  I-STAT CG4 LACTIC ACID, ED - Abnormal; Notable for the following:    Lactic Acid, Venous 2.81 (*)    All other components within normal limits  I-STAT CHEM 8, ED - Abnormal; Notable for the following:    Sodium 133 (*)    Potassium 6.0 (*)    BUN 74 (*)    Creatinine, Ser 7.40 (*)    Glucose, Bld 248 (*)    Calcium, Ion 0.94 (*)    All other components within normal limits  CBG MONITORING, ED - Abnormal; Notable for the following:    Glucose-Capillary 279 (*)    All other components within normal limits  CBG MONITORING, ED - Abnormal; Notable for the following:    Glucose-Capillary 286 (*)    All other components within normal limits  CULTURE, BLOOD (ROUTINE X 2)  CULTURE, BLOOD (ROUTINE X 2)  URINE CULTURE  MRSA  PCR SCREENING  LIPASE, BLOOD  PREGNANCY, URINE  CREATININE, SERUM  COMPREHENSIVE METABOLIC PANEL  CBC WITH DIFFERENTIAL  TROPONIN I  TROPONIN I  TROPONIN I  HEMOGLOBIN A1C  I-STAT CG4 LACTIC ACID, ED  I-STAT CG4 LACTIC ACID, ED    Imaging Review Ct Abdomen Pelvis Wo Contrast  03/28/2014   CLINICAL DATA:  Fall 3 days ago.  Lethargy.  EXAM: CT ABDOMEN AND PELVIS WITHOUT CONTRAST  TECHNIQUE: Multidetector CT imaging of the abdomen and pelvis was performed following the standard protocol without IV contrast.  COMPARISON:  02/15/2014  FINDINGS: Lack of oral and intravenous contrast limits the exam. Motion artifact limits the exam. Overall, this is a severely limited examination.  Stable left lower lobe pulmonary nodule.  Stable left pleural effusion with features of loculation.  Stable small pericardial effusion.  Gallstones have developed.  Liver, spleen, pancreas are grossly within normal limits.  There is nonspecific stranding involving the left adrenal gland. Right adrenal gland is stable with some calcifications.  Lymph stones image gastrohepatic ligament and about the aorta are stable compatible with mild adenopathy.  Stable appearance of the kidneys.  Left renal cyst.  Bladder is decompressed.  There is stranding and soft tissue density within the right inguinal region which is stable. Stranding throughout the subcutaneous fat and intraperitoneal fat is present compatible with diffuse anasarca.  Atherosclerotic calcifications are present and stable  Uterus is unremarkable.  IMPRESSION: Cholelithiasis is now evident.  Diffuse anasarca.  Other findings are stable compared to the prior study. This includes a left lower lobe pulmonary nodule as well as abdominal adenopathy. There is also stranding within the soft tissues of the right inguinal region. An inflammatory process is not excluded.   Electronically Signed   By: Maryclare Bean M.D.   On: 03/24/2014 20:34   Ct Head Wo Contrast  04/17/2014    CLINICAL DATA:  Weakness  EXAM: CT HEAD WITHOUT CONTRAST  TECHNIQUE: Contiguous axial images were obtained from the base of the skull through the vertex without intravenous contrast.  COMPARISON:  07/28/2011  FINDINGS: No skull fracture is noted. Paranasal sinuses and mastoid air cells are unremarkable. Postsurgical high-density material noted right eye globe.  No intracranial hemorrhage, mass effect or midline shift.  No acute cortical infarction. No mass lesion is noted on this unenhanced scan. The gray and white-matter differentiation is is preserved. Ventricular size is stable from prior exam.  IMPRESSION: No acute intracranial abnormality.  No significant change.   Electronically Signed   By: Lahoma Crocker M.D.   On: 04/06/2014 17:23   Dg Chest Port 1 View  (if Code Sepsis Called)  03/27/2014   CLINICAL DATA:  Lethargy, shortness of Breath  EXAM: PORTABLE CHEST - 1 VIEW  COMPARISON:  03/19/2014  FINDINGS: Cardiomegaly again noted. Dual lumen left IJ catheter is unchanged in position. Central mild vascular congestion without convincing pulmonary edema. Mild left basilar atelectasis. No segmental infiltrate.  IMPRESSION: Cardiomegaly .Mild left basilar atelectasis. No segmental infiltrate. Central mild vascular congestion without pulmonary edema.   Electronically Signed   By: Lahoma Crocker M.D.   On: 04/07/2014 16:22     EKG Interpretation None       Dr Mingo Amber has seen and examined patient and was involved early in her care.     Date: 03/21/2014  Rate: 115  Rhythm: sinus tachycardia  QRS Axis: normal  Intervals: normal  ST/T Wave abnormalities: nonspecific ST/T changes and ST depressions diffusely  Conduction Disutrbances:none  Narrative Interpretation:   Old EKG Reviewed: unchanged    9:23 PM Discussed pt with Dr Ernestina Patches who accepts patient for admission ([facility]),  Requests calls to nephrology and cardiology for consult.   9:39 PM Cardiology to see patient.  I spoke with Dr Mercy Moore about the  patient.  She will be dialyzed tonight or tomorrow.   CRITICAL CARE Performed by: Clayton Bibles   Total critical care time: 40  Critical care time was exclusive of separately billable procedures and treating other patients.  Critical care was necessary to treat or prevent imminent or life-threatening deterioration.  Critical care was time spent personally by me on the following activities: development of treatment plan with patient and/or surrogate as well as nursing, discussions with consultants, evaluation of patient's response to treatment, examination of patient, obtaining history from patient or surrogate, ordering and performing treatments and interventions, ordering and review of laboratory studies, ordering and review of radiographic studies, pulse oximetry and re-evaluation of patient's condition.   MDM   Final diagnoses:  Elevated LFTs  Sepsis, due to unspecified organism  Elevated troponin  UTI (lower urinary tract infection)    Febrile patient with fatigued, generalized weakness x 2 days.  Is sleepy but answers questions.  Dry mucous membranes but with large amount of peripheral edema. Antibiotics started initially without known source of presumed infection. Lactic acid initially 4.35 improved to 2.8 after 1L IVF. She has leukocytosis, anemia, hyperkalemia (treated in ED per moderate hyperkalemia protocol), renal function consistent with end stage renal disease who hasn't had dialysis in 4 days.  LFTs with significant elevation, this is a new finding.  Ammonia level is slightly elevated.  This is consistent with an evolving encephalopathy clinically.  UA per nurse obtained by in and out cath.  It is nitrite positive with many bacteria and 7-10 WBC. I suspect this is the source of her sepsis.  PT also found to have elevated troponin, suspect demand ischemia from significant illness.  BNP also elevated.  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 Pt admitted to [facility].  Cardiology and Nephrology  to consult.     Clayton Bibles, PA-C 04/10/14 0036  Evelina Bucy, MD 04/10/14 [place], PA-C 04/10/14 [id number]  Evelina Bucy, MD 04/10/14 [phone]

## 2014-04-09 NOTE — ED Notes (Signed)
Level 2 Sepsis activated @ 1732

## 2014-04-10 DIAGNOSIS — R740 Nonspecific elevation of levels of transaminase and lactic acid dehydrogenase [LDH]: Secondary | ICD-10-CM

## 2014-04-10 DIAGNOSIS — R7401 Elevation of levels of liver transaminase levels: Secondary | ICD-10-CM | POA: Diagnosis present

## 2014-04-10 DIAGNOSIS — I319 Disease of pericardium, unspecified: Secondary | ICD-10-CM

## 2014-04-10 LAB — CBC
HEMATOCRIT: 31 % — AB (ref 36.0–46.0)
HEMOGLOBIN: 9 g/dL — AB (ref 12.0–15.0)
MCH: 26.9 pg (ref 26.0–34.0)
MCHC: 29 g/dL — ABNORMAL LOW (ref 30.0–36.0)
MCV: 92.8 fL (ref 78.0–100.0)
Platelets: 425 10*3/uL — ABNORMAL HIGH (ref 150–400)
RBC: 3.34 MIL/uL — AB (ref 3.87–5.11)
RDW: 19.5 % — ABNORMAL HIGH (ref 11.5–15.5)
WBC: 11.9 10*3/uL — AB (ref 4.0–10.5)

## 2014-04-10 LAB — COMPREHENSIVE METABOLIC PANEL
ALT: 929 U/L — AB (ref 0–35)
AST: 2368 U/L — AB (ref 0–37)
Albumin: 1.7 g/dL — ABNORMAL LOW (ref 3.5–5.2)
Alkaline Phosphatase: 180 U/L — ABNORMAL HIGH (ref 39–117)
Anion gap: 21 — ABNORMAL HIGH (ref 5–15)
BUN: 83 mg/dL — ABNORMAL HIGH (ref 6–23)
CO2: 16 mmol/L — AB (ref 19–32)
Calcium: 7.6 mg/dL — ABNORMAL LOW (ref 8.4–10.5)
Chloride: 100 mEq/L (ref 96–112)
Creatinine, Ser: 8.01 mg/dL — ABNORMAL HIGH (ref 0.50–1.10)
GFR calc Af Amer: 6 mL/min — ABNORMAL LOW (ref >90)
GFR, EST NON AFRICAN AMERICAN: 5 mL/min — AB (ref >90)
Glucose, Bld: 275 mg/dL — ABNORMAL HIGH (ref 70–99)
Potassium: 5.8 mmol/L — ABNORMAL HIGH (ref 3.5–5.1)
SODIUM: 137 mmol/L (ref 135–145)
Total Bilirubin: 1.2 mg/dL (ref 0.3–1.2)
Total Protein: 6 g/dL (ref 6.0–8.3)

## 2014-04-10 LAB — CBC WITH DIFFERENTIAL/PLATELET
Basophils Absolute: 0 10*3/uL (ref 0.0–0.1)
Basophils Relative: 0 % (ref 0–1)
Eosinophils Absolute: 0 10*3/uL (ref 0.0–0.7)
Eosinophils Relative: 0 % (ref 0–5)
HCT: 31.8 % — ABNORMAL LOW (ref 36.0–46.0)
Hemoglobin: 9.3 g/dL — ABNORMAL LOW (ref 12.0–15.0)
LYMPHS ABS: 0.8 10*3/uL (ref 0.7–4.0)
LYMPHS PCT: 7 % — AB (ref 12–46)
MCH: 27.2 pg (ref 26.0–34.0)
MCHC: 29.2 g/dL — ABNORMAL LOW (ref 30.0–36.0)
MCV: 93 fL (ref 78.0–100.0)
Monocytes Absolute: 0.5 10*3/uL (ref 0.1–1.0)
Monocytes Relative: 5 % (ref 3–12)
NEUTROS PCT: 88 % — AB (ref 43–77)
Neutro Abs: 9.8 10*3/uL — ABNORMAL HIGH (ref 1.7–7.7)
PLATELETS: 431 10*3/uL — AB (ref 150–400)
RBC: 3.42 MIL/uL — AB (ref 3.87–5.11)
RDW: 19.3 % — ABNORMAL HIGH (ref 11.5–15.5)
WBC: 11.2 10*3/uL — AB (ref 4.0–10.5)

## 2014-04-10 LAB — GLUCOSE, CAPILLARY
GLUCOSE-CAPILLARY: 224 mg/dL — AB (ref 70–99)
GLUCOSE-CAPILLARY: 180 mg/dL — AB (ref 70–99)
GLUCOSE-CAPILLARY: 128 mg/dL — AB (ref 70–99)
Glucose-Capillary: 117 mg/dL — ABNORMAL HIGH (ref 70–99)
Glucose-Capillary: 119 mg/dL — ABNORMAL HIGH (ref 70–99)
Glucose-Capillary: 302 mg/dL — ABNORMAL HIGH (ref 70–99)

## 2014-04-10 LAB — TROPONIN I
TROPONIN I: 1.42 ng/mL — AB (ref <0.031)
Troponin I: 1.3 ng/mL (ref <0.031)

## 2014-04-10 LAB — HEPATITIS PANEL, ACUTE
HCV Ab: NEGATIVE
Hep A IgM: NONREACTIVE
Hep B C IgM: NONREACTIVE
Hepatitis B Surface Ag: NEGATIVE

## 2014-04-10 LAB — RENAL FUNCTION PANEL
ANION GAP: 20 — AB (ref 5–15)
Albumin: 1.7 g/dL — ABNORMAL LOW (ref 3.5–5.2)
BUN: 84 mg/dL — AB (ref 6–23)
CHLORIDE: 97 meq/L (ref 96–112)
CO2: 18 mmol/L — AB (ref 19–32)
Calcium: 7.1 mg/dL — ABNORMAL LOW (ref 8.4–10.5)
Creatinine, Ser: 7.92 mg/dL — ABNORMAL HIGH (ref 0.50–1.10)
GFR calc non Af Amer: 5 mL/min — ABNORMAL LOW (ref >90)
GFR, EST AFRICAN AMERICAN: 6 mL/min — AB (ref >90)
Glucose, Bld: 232 mg/dL — ABNORMAL HIGH (ref 70–99)
PHOSPHORUS: 10.9 mg/dL — AB (ref 2.3–4.6)
POTASSIUM: 5.3 mmol/L — AB (ref 3.5–5.1)
SODIUM: 135 mmol/L (ref 135–145)

## 2014-04-10 LAB — URINE CULTURE
CULTURE: NO GROWTH
Colony Count: NO GROWTH

## 2014-04-10 LAB — MRSA PCR SCREENING: MRSA by PCR: NEGATIVE

## 2014-04-10 LAB — CREATININE, SERUM
CREATININE: 7.86 mg/dL — AB (ref 0.50–1.10)
GFR calc Af Amer: 6 mL/min — ABNORMAL LOW (ref >90)
GFR, EST NON AFRICAN AMERICAN: 6 mL/min — AB (ref >90)

## 2014-04-10 LAB — HEMOGLOBIN A1C
Hgb A1c MFr Bld: 7.7 % — ABNORMAL HIGH (ref <5.7)
MEAN PLASMA GLUCOSE: 174 mg/dL — AB (ref <117)

## 2014-04-10 LAB — PROTIME-INR
INR: 2.13 — AB (ref 0.00–1.49)
Prothrombin Time: 24 seconds — ABNORMAL HIGH (ref 11.6–15.2)

## 2014-04-10 LAB — ACETAMINOPHEN LEVEL: Acetaminophen (Tylenol), Serum: <10 ug/mL — ABNORMAL LOW (ref 10–30)

## 2014-04-10 LAB — LACTIC ACID, PLASMA: Lactic Acid, Venous: 2.4 mmol/L — ABNORMAL HIGH (ref 0.5–2.2)

## 2014-04-10 MED ORDER — MIDODRINE HCL 5 MG PO TABS
10.0000 mg | ORAL_TABLET | ORAL | Status: DC
  Filled 2014-04-10: qty 2

## 2014-04-10 MED ORDER — PERFLUTREN LIPID MICROSPHERE
INTRAVENOUS | Status: AC
  Administered 2014-04-10: 2 mL
  Filled 2014-04-10: qty 10

## 2014-04-10 MED ORDER — INSULIN ASPART 100 UNIT/ML ~~LOC~~ SOLN
0.0000 [IU] | SUBCUTANEOUS | Status: DC
  Administered 2014-04-10: 4 [IU] via SUBCUTANEOUS
  Administered 2014-04-10: 3 [IU] via SUBCUTANEOUS
  Administered 2014-04-11: 4 [IU] via SUBCUTANEOUS
  Administered 2014-04-11 (×2): 3 [IU] via SUBCUTANEOUS
  Administered 2014-04-12: 7 [IU] via SUBCUTANEOUS
  Administered 2014-04-12: 3 [IU] via SUBCUTANEOUS
  Administered 2014-04-12: 4 [IU] via SUBCUTANEOUS
  Administered 2014-04-13: 3 [IU] via SUBCUTANEOUS

## 2014-04-10 MED ORDER — SODIUM CHLORIDE 0.9 % IV SOLN
125.0000 mg | INTRAVENOUS | Status: DC
  Administered 2014-04-10: 125 mg via INTRAVENOUS
  Filled 2014-04-10 (×3): qty 10

## 2014-04-10 MED ORDER — METOPROLOL TARTRATE 1 MG/ML IV SOLN
5.0000 mg | Freq: Four times a day (QID) | INTRAVENOUS | Status: DC
  Administered 2014-04-10 – 2014-04-11 (×3): 5 mg via INTRAVENOUS
  Filled 2014-04-10 (×7): qty 5

## 2014-04-10 MED ORDER — INSULIN ASPART 100 UNIT/ML ~~LOC~~ SOLN: 0.0000 [IU] | Freq: Every day | SUBCUTANEOUS | Status: DC

## 2014-04-10 MED ORDER — CINACALCET HCL 30 MG PO TABS: 30.0000 mg | ORAL_TABLET | Freq: Every day | ORAL | Status: DC

## 2014-04-10 MED ORDER — SEVELAMER CARBONATE 800 MG PO TABS
2400.0000 mg | ORAL_TABLET | Freq: Three times a day (TID) | ORAL | Status: DC
  Administered 2014-04-11 – 2014-04-14 (×11): 2400 mg via ORAL
  Filled 2014-04-10 (×18): qty 3

## 2014-04-10 MED ORDER — RENA-VITE PO TABS
1.0000 | ORAL_TABLET | Freq: Every day | ORAL | Status: DC
  Administered 2014-04-10 – 2014-04-14 (×5): 1 via ORAL
  Filled 2014-04-10 (×7): qty 1

## 2014-04-10 MED ORDER — PERFLUTREN LIPID MICROSPHERE
1.0000 mL | INTRAVENOUS | Status: AC | PRN
  Filled 2014-04-10: qty 10

## 2014-04-10 MED ORDER — METHYLCELLULOSE 1 % OP SOLN: 1.0000 [drp] | Freq: Every evening | OPHTHALMIC | Status: DC | PRN

## 2014-04-10 MED ORDER — DARBEPOETIN ALFA 100 MCG/0.5ML IJ SOSY
100.0000 ug | PREFILLED_SYRINGE | INTRAMUSCULAR | Status: DC
  Administered 2014-04-10: 100 ug via INTRAVENOUS
  Filled 2014-04-10: qty 0.5

## 2014-04-10 MED ORDER — CINACALCET HCL 30 MG PO TABS
30.0000 mg | ORAL_TABLET | Freq: Every day | ORAL | Status: DC
  Administered 2014-04-11 – 2014-04-14 (×4): 30 mg via ORAL
  Filled 2014-04-10 (×6): qty 1

## 2014-04-10 MED ORDER — DARBEPOETIN ALFA 100 MCG/0.5ML IJ SOSY
PREFILLED_SYRINGE | INTRAMUSCULAR | Status: AC
  Administered 2014-04-10: 100 ug via INTRAVENOUS
  Filled 2014-04-10: qty 0.5

## 2014-04-10 MED ORDER — POLYVINYL ALCOHOL 1.4 % OP SOLN
1.0000 [drp] | Freq: Every evening | OPHTHALMIC | Status: DC | PRN
  Filled 2014-04-10: qty 15

## 2014-04-10 NOTE — Progress Notes (Signed)
[facility]  Patient Status: Active (receiving services up to time of hospitalization)  [facility] is providing the following services: RN  If patient discharges after hours, please call [phone].   Hannah [name] 04/10/2014, 11:42 AM

## 2014-04-10 NOTE — Consult Note (Signed)
Hannah [name] is an 43 y.o. female referred by Dr Ernestina Patches   Chief Complaint: Hyperkalemia, ESRD, sec HPTH, Anemia HPI: 43yo WF on HD in [place] admitted to hospital with encephalopathy and concerns over infection.  Dx MWF and missed HD only on Mon according to her husband.  She has a perm cath but also wounds over abd and lower legs.  K sl high last night, a little better this AM.  Past Medical History  Diagnosis Date  . High cholesterol   . Diabetic retinopathy   . Peripheral neuropathy     "tips of toes"  . Blind right eye   . CHF (congestive heart failure)   . CAD (coronary artery disease)   . GERD (gastroesophageal reflux disease)   . Hypertension   . History of lung cancer 07/2011    s/p left lower lobectomy  . Diabetes mellitus     IDDM  . Cataract of right eye   . Ulcer of toe of right foot 07/10/2012    great toe  . Breast calcification, left 06/2012  . Acute biphenotypic leukemia   . CKD (chronic kidney disease) stage 5, GFR less than 15 ml/min   . Nephrotic syndrome   . Gastritis     H/o gastritis on prior endoscopy  . Anemia   . Carotid artery disease   . Gallstones   . Myocardial infarction 5/16    Past Surgical History  Procedure Laterality Date  . Cardiac catheterization  07/16/2011  . Incision and drainage breast abscess Left   . Tubal ligation  1994  . Vitrectomy  2010    2 on left, 1 on right  . Cesarean section  1991; 1994  . Video assisted thoracoscopy (vats)/ lobectomy Left 07/30/2011    left main thoracotomy, left lower lobectomy, mediastinal lymph node dissection  . Lobectomy    . Colonoscopy with esophagogastroduodenoscopy (egd) N/A 08/14/2012    Procedure: COLONOSCOPY WITH ESOPHAGOGASTRODUODENOSCOPY (EGD);  Surgeon: Danie Binder, MD;  [facility];  Service: Endoscopy;  Laterality: N/A;  10:45-moved to [address] to notify pt  . Breast lumpectomy with needle localization Left 11/14/2012    Procedure: BREAST LUMPECTOMY WITH NEEDLE  LOCALIZATION;  Surgeon: Marcello Moores A. Cornett, MD;  Location: [place];  Service: General;  Laterality: Left;  . Insertion of dialysis catheter Left 09/12/2013    Procedure: INSERTION OF DIALYSIS CATHETER;  Surgeon: Mal Misty, MD;  Location: [place];  Service: Vascular;  Laterality: Left;  . Embolectomy Right 09/17/2013    Procedure: Thrombectomy of Right Common Femoral Artery;  Surgeon: Elam Dutch, MD;  Location: [facility] OR;  Service: Vascular;  Laterality: Right;  . Endarterectomy femoral Right 09/17/2013    Procedure: Right Femoral Endarterectomy;  Surgeon: Elam Dutch, MD;  Location: [facility] OR;  Service: Vascular;  Laterality: Right;  . Patch angioplasty Right 09/17/2013    Procedure: Vein Patch Angioplasty of Right Femoral Artery;  Surgeon: Elam Dutch, MD;  Location: [place];  Service: Vascular;  Laterality: Right;  . Fasciotomy Right 09/17/2013    Procedure: Four Compartment Fasciotomy;  Surgeon: Elam Dutch, MD;  Location: [place];  Service: Vascular;  Laterality: Right;  . Fasciotomy closure Right 09/19/2013    Procedure: FASCIOTOMY CLOSURE;  Surgeon: Elam Dutch, MD;  Location: [place];  Service: Vascular;  Laterality: Right;  regional block and monitored anesthesia care used  . I&d extremity Right 09/28/2013    Procedure: IRRIGATION AND DEBRIDEMENT EXTREMITY;  Surgeon: Serafina Mitchell, MD;  Location: [place];  Service: Vascular;  Laterality: Right;  . Application of wound vac Right 09/28/2013    Procedure: APPLICATION OF WOUND VAC;  Surgeon: Serafina Mitchell, MD;  Location: [place];  Service: Vascular;  Laterality: Right;  . Av fistula placement Right 01/03/2014    Procedure: ARTERIOVENOUS (AV) FISTULA CREATION;  Surgeon: Angelia Mould, MD;  Location: [place];  Service: Vascular;  Laterality: Right;  . Ligation of arteriovenous  fistula Right 01/04/2014    Procedure: LIGATION OF ARTERIOVENOUS  FISTULA;  Surgeon: Rosetta Posner, MD;  Location: [place];  Service: Vascular;  Laterality: Right;  .  Left heart catheterization with coronary angiogram N/A 09/04/2013    Procedure: LEFT HEART CATHETERIZATION WITH CORONARY ANGIOGRAM;  Surgeon: Peter M Martinique, MD;  Location: [facility] CATH LAB;  Service: Cardiovascular;  Laterality: N/A;  . Intra-aortic balloon pump insertion N/A 09/13/2013    Procedure: INTRA-AORTIC BALLOON PUMP INSERTION;  Surgeon: Jolaine Artist, MD;  Location: [facility] CATH LAB;  Service: Cardiovascular;  Laterality: N/A;  . Right heart catheterization  09/13/2013    Procedure: RIGHT HEART CATH;  Surgeon: Jolaine Artist, MD;  Location: [facility] CATH LAB;  Service: Cardiovascular;;    Family History  Problem Relation Age of Onset  . Coronary artery disease Father   . Asthma Father   . COPD Father   . Hypertension Father   . Hyperlipidemia Father   . Diabetes Father   . Congestive Heart Failure Father   . Heart disease Father     before age 72  . Heart attack Father   . Peripheral vascular disease Father   . Hypertension Mother   . Hyperlipidemia Mother   . Diabetes Mother   . Cancer Mother   . Cancer Maternal Aunt      three aunts, bone, breast, ?  . Hypertension Brother   . Diabetes Brother   . Diabetes Sister   . Colon cancer Neg Hx   . Celiac disease Neg Hx   . Crohn's disease Neg Hx   . Ulcerative colitis Neg Hx   . Lung cancer Maternal Grandmother    Social History:  reports that she quit smoking about 2 years ago. She has never used smokeless tobacco. She reports that she does not drink alcohol or use illicit drugs.  Allergies:  Allergies  Allergen Reactions  . Crestor [Rosuvastatin] Other (See Comments)    Severe muscle weakness  . Nsaids Other (See Comments)    Not allergic, "bad on my kidneys"  . Ciprofloxacin Rash    Medications Prior to Admission  Medication Sig Dispense Refill  . aspirin EC 81 MG EC tablet Take 1 tablet (81 mg total) by mouth daily. 30 tablet 0  . atorvastatin (LIPITOR) 80 MG tablet Take 1 tablet (80 mg total) by mouth daily at 6  PM. 30 tablet 6  . carvedilol (COREG) 3.125 MG tablet Take 1 tablet (3.125 mg total) by mouth 2 (two) times daily with a meal. 60 tablet 3  . cinacalcet (SENSIPAR) 30 MG tablet Take 1 tablet (30 mg total) by mouth daily with breakfast. 60 tablet   . clopidogrel (PLAVIX) 75 MG tablet Take 1 tablet (75 mg total) by mouth daily. 30 tablet 3  . cyclobenzaprine (FLEXERIL) 10 MG tablet Take 10 mg by mouth 3 (three) times daily as needed for muscle spasms.    Marland Kitchen dexlansoprazole (DEXILANT) 60 MG capsule Take 1 capsule (60 mg total) by mouth  daily. 30 capsule 3  . Docusate Calcium (STOOL SOFTENER PO) Take 1-3 capsules by mouth daily as needed (constipation).    . insulin aspart (NOVOLOG) 100 UNIT/ML FlexPen 5 Units, Subcutaneous, 3 times daily with meals 15 mL 11  . Insulin Detemir (LEVEMIR FLEXPEN) 100 UNIT/ML Pen Inject 20 Units into the skin every evening. 15 mL 11  . methylcellulose (ARTIFICIAL TEARS) 1 % ophthalmic solution Place 1 drop into both eyes at bedtime as needed (dry eyes).    . midodrine (PROAMATINE) 10 MG tablet Take 5 mg by mouth 3 (three) times a week. Monday, Wednesday and Friday    . multivitamin (RENA-VIT) TABS tablet Take 1 tablet by mouth daily.    . OxyCODONE HCl ER 30 MG T12A Take 1 tablet by mouth 2 (two) times daily as needed. 60 each 0  . polyethylene glycol (MIRALAX / GLYCOLAX) packet Take 17 g by mouth daily as needed (constipation). 14 each 6  . promethazine (PHENERGAN) 25 MG tablet Take 1 tablet (25 mg total) by mouth every 8 (eight) hours as needed for nausea or vomiting. 20 tablet 2  . sevelamer carbonate (RENVELA) 800 MG tablet Take 3 tablets (2,400 mg total) by mouth 3 (three) times daily with meals.    . triamcinolone cream (KENALOG) 0.1 % Apply 1 application topically 2 (two) times daily. 45 g 1  . ursodiol (ACTIGALL) 300 MG capsule Take 1 capsule (300 mg total) by mouth 2 (two) times daily. 60 capsule 4  . HYDROmorphone (DILAUDID) 4 MG tablet Take 1 tablet (4 mg total)  by mouth 3 (three) times daily. (Patient not taking: Reported on 03/21/2014) 60 tablet 0     Lab Results: UA: 7-10 wbc's, >300 prot, 0-2 rbc.  Many bacteria   Recent Labs  03/22/2014 1550 03/28/2014 1806 03/28/2014 2234 04/10/14 0324  WBC 11.9*  --  12.9* 11.2*  HGB 9.5* 12.6 9.0* 9.3*  HCT 32.7* 37.0 31.1* 31.8*  PLT 449*  --  420* 431*   BMET  Recent Labs  03/25/2014 1550 03/25/2014 1806 03/21/2014 1850 03/20/2014 2234 04/10/14 0324  NA 134* 133*  --   --  137  K 6.3* 6.0*  --   --  5.8*  CL 99 103  --   --  100  CO2 14*  --   --   --  16*  GLUCOSE 263* 248* 283*  --  275*  BUN 77* 74*  --   --  83*  CREATININE 8.01* 7.40*  --  7.86* 8.01*  CALCIUM 7.9*  --   --   --  7.6*   LFT  Recent Labs  04/10/14 0324  PROT 6.0  ALBUMIN 1.7*  AST 2368*  ALT 929*  ALKPHOS 180*  BILITOT 1.2   Ct Abdomen Pelvis Wo Contrast  04/12/2014   CLINICAL DATA:  Fall 3 days ago.  Lethargy.  EXAM: CT ABDOMEN AND PELVIS WITHOUT CONTRAST  TECHNIQUE: Multidetector CT imaging of the abdomen and pelvis was performed following the standard protocol without IV contrast.  COMPARISON:  02/15/2014  FINDINGS: Lack of oral and intravenous contrast limits the exam. Motion artifact limits the exam. Overall, this is a severely limited examination.  Stable left lower lobe pulmonary nodule.  Stable left pleural effusion with features of loculation.  Stable small pericardial effusion.  Gallstones have developed.  Liver, spleen, pancreas are grossly within normal limits.  There is nonspecific stranding involving the left adrenal gland. Right adrenal gland is stable with some calcifications.  Lymph stones image gastrohepatic ligament and about the aorta are stable compatible with mild adenopathy.  Stable appearance of the kidneys.  Left renal cyst.  Bladder is decompressed.  There is stranding and soft tissue density within the right inguinal region which is stable. Stranding throughout the subcutaneous fat and  intraperitoneal fat is present compatible with diffuse anasarca.  Atherosclerotic calcifications are present and stable  Uterus is unremarkable.  IMPRESSION: Cholelithiasis is now evident.  Diffuse anasarca.  Other findings are stable compared to the prior study. This includes a left lower lobe pulmonary nodule as well as abdominal adenopathy. There is also stranding within the soft tissues of the right inguinal region. An inflammatory process is not excluded.   Electronically Signed   By: Maryclare Bean M.D.   On: 03/24/2014 20:34   Ct Head Wo Contrast  03/19/2014   CLINICAL DATA:  Weakness  EXAM: CT HEAD WITHOUT CONTRAST  TECHNIQUE: Contiguous axial images were obtained from the base of the skull through the vertex without intravenous contrast.  COMPARISON:  07/28/2011  FINDINGS: No skull fracture is noted. Paranasal sinuses and mastoid air cells are unremarkable. Postsurgical high-density material noted right eye globe.  No intracranial hemorrhage, mass effect or midline shift. No acute cortical infarction. No mass lesion is noted on this unenhanced scan. The gray and white-matter differentiation is is preserved. Ventricular size is stable from prior exam.  IMPRESSION: No acute intracranial abnormality.  No significant change.   Electronically Signed   By: Lahoma Crocker M.D.   On: 03/29/2014 17:23   Dg Chest Port 1 View  (if Code Sepsis Called)  04/04/2014   CLINICAL DATA:  Lethargy, shortness of Breath  EXAM: PORTABLE CHEST - 1 VIEW  COMPARISON:  03/19/2014  FINDINGS: Cardiomegaly again noted. Dual lumen left IJ catheter is unchanged in position. Central mild vascular congestion without convincing pulmonary edema. Mild left basilar atelectasis. No segmental infiltrate.  IMPRESSION: Cardiomegaly .Mild left basilar atelectasis. No segmental infiltrate. Central mild vascular congestion without pulmonary edema.   Electronically Signed   By: Lahoma Crocker M.D.   On: 04/05/2014 16:22    ROS: No change vision No  CP No SOB  + abd pain + pain in lower ext  PHYSICAL EXAM: Blood pressure 106/63, pulse 113, temperature 97.5 F (36.4 C), temperature source Oral, resp. rate 7, height 5' 7"  (1.702 m), weight 117.935 kg (260 lb), last menstrual period 09/17/2013, SpO2 98 %. HEENT: PERRLA EOMI NECK:Lt IJ permcath LUNGS:clear CARDIAC:RRR with pericardial friction rub ABD:+ BS diffuse tenderness with large wound with eschar LLQ with marked induration FWY:OVZCH ulcers on both lower ext NEURO: lethargic but follows commands and O X3  Assessment: 1. Sepsis syndrome  Multiple potential sources with first being HD catheter then UTI then ulcers 2. Hyperkalemia 3. Met acidosis 4. Prob calciphylaxis 5. Anemia 6. Sec HPTH 7. I don't think her friction rub is from uremia.  I suggest looking for other causes and check echo PLAN: 1. HD this AM without heparin.  Using low calcium bath 2. Echo 3. Cont sensipar 4. Resume aranesp 5. Await culture results   Hailey Stormer T 04/10/2014, 6:01 AM

## 2014-04-10 NOTE — Progress Notes (Signed)
Hemodialysis- System clotted with 15 minutes remaining, d/t no heparin tx (despite frequent flushing), all blood able to be given back. Pt tolerated well without complaints. Remains somewhat lethargic but arousable.

## 2014-04-10 NOTE — Care Management Note (Signed)
    Page 1 of 1   04/10/2014     11:35:17 AM CARE MANAGEMENT NOTE 04/10/2014  Patient:  Hannah [name], Hannah [name]   Account Number:  [id number]  Date Initiated:  04/10/2014  Documentation initiated by:  Elissa Hefty  Subjective/Objective Assessment:   adm w uti,sepsis     Action/Plan:   lives w fam, pcp dr Kevan Rosebush, act w [facility] for hhrn   Anticipated DC Date:     Anticipated DC Plan:  [place]         [facility] Choice  Resumption Of Svcs/PTA Provider   Choice offered to / List presented to:          [facility] arranged  HH-1 RN      [place].   Status of service:   Medicare Important Message given?   (If response is "NO", the following Medicare IM given date fields will be blank) Date Medicare IM given:   Medicare IM given by:   Date Additional Medicare IM given:   Additional Medicare IM given by:    Discharge Disposition:  [place]  Per UR Regulation:  Reviewed for med. necessity/level of care/duration of stay  If discussed at [place] of Stay Meetings, dates discussed:    Comments:  12/23 1134a debbie Samia Kukla rn,bsn have alerted adv homecare of adm.

## 2014-04-10 NOTE — Progress Notes (Signed)
[facility] [facility] TEAM 1 - Stepdown/ICU TEAM Progress Note  Hannah [name] AYT:[id number] DOB: Jan 24, 1971 DOA: 04/16/2014 PCP: Vic Blackbird, MD  Admit HPI / Brief Narrative: 43 year old female witH history of ESRD on dialysis Monday Wednesday and Friday, insulin-dependent diabetes, calciphylaxis, morbid obesity, coronary artery disease status post MI, ischemic cardiomyopathy, and lung cancer status post lobectomy presenting with sepsis, encephalopathy, UTI, anasarca, elevated troponin, and signif transaminitis. Husband reported patient with generalized malaise and weakness over 4-5 days. Was unable to go to dialysis on Friday secondary to weakness. Also missed dialysis Monday. EMS was called to the home because of persistent lethargy.  In the ER temperature was 100, heart rate in the 90s to 120s, respirations in the 20s, blood pressure in the 90s to 140s. Satting 94% on room air. White blood cell count 11.9, hemoglobin 12.6, potassium 6.3. Creatinine 7.4. Bicarb 14. AST 904, ALT 340, ALP 207, t bili 1.4. Troponin 1.19. EKG sinus tachycardia. Urinalysis indicative of infection. Was given 1 L normal saline bolus in the ER. Also given Kayexalate oral. Chest x-ray noted cardiomegaly and left basilar atelectasis. Pulmonary vascular congestion. Head CT within normal limits. CT of abdomen and pelvis noted anasarca, cholelithiasis, abdominal adenopathy, and left lower lobe pulmonary nodule. I  HPI/Subjective: Pt is lethargic but arousable.  She is able to follow simple commands.  She c/o abdom pain in her RUQ.  She denies cp, sob, n/v, or ha.  She denies focal weakness and is able to move all extrem.   Assessment/Plan:  Toxic metabolic encephalopathy multifactorial in setting of sepsis, UTI, end-stage renal disease, hepatic enceph - begin lactulose when able to tolerate oral intake - no focal deficits to suggest CVA  Sepsis w/ lactic acidosis  Suspect urinary source though there are multiple potential  sources given skin findings of calciphylaxis and ulcers - Vanco and Zosyn empirically - hemodynamics improving - follow culture data   UTI  Cont empiric abx - f/u culture data  Elevated troponin - CAD- severe 3V CAD May 2015- turned down for CABG Likely demand ischemia in the setting of above and baseline end-stage renal disease - Cards following - trop appears to have peaked at 1.42 - no reports of cp   Cardiomyopathy, ischemic- EF 25-30% July 2015  Transaminitis - Coagulopathy - Hepatic encephalopathy - Shock liver noted cholelithiasis without evidence of cholecystitis on abd CT - acute hepatitis panel, tylenol levels pending - trend - avoid tylenol   Pericardial friction rub TTE pending - Cards following   ESRD missed 2 episodes of hemodialysis - Nephrology following - receiving HD today   Hyperkalemia No peaked T waves on EKG - on HD at time of exam today  DM w retinopathy - uncontrolled  CDG uncontrolled - adjust tx and follow trend  Morbid obesity - Body mass index is 42.12 kg/(m^2).   GERD  Anemia of chronic kidney failure and chronic disease   Hx Lung CA s/p LLL lobectomy 2013  Code Status: FULL Family Communication: no family present at time of exam Disposition Plan: SDU  Consultants: Nephrology  Cardiology   Procedures: none  Antibiotics: Vanc 12/22 > Zosyn 12/22 >  DVT prophylaxis: SCDs only due to coagulopathy   Objective: Blood pressure 114/59, pulse 123, temperature 98 F (36.7 C), temperature source Oral, resp. rate 11, height 5\' 7"  (1.702 m), weight 122 kg (268 lb 15.4 oz), last menstrual period 09/17/2013, SpO2 99 %.  Intake/Output Summary (Last 24 hours) at 04/10/14 3557 Last data filed  at 04/10/14 0400  Gross per 24 hour  Intake     80 ml  Output     83 ml  Net     -3 ml   Exam: General: No acute respiratory distress  Lungs: Clear to auscultation bilaterally without wheezes or crackles - poor air movement B bases Cardiovascular:  Regular rate and rhythm without murmur - distant HS Abdomen:  Morbidly obese - indurated "woody" cutaneous lesions LLQ and RUQ region w/ surrounding erythema but no purulent d/c - necrotic black eschar covering large portion of LLQ wound - BS+ - no rebound - soft  Extremities: 2+ edema B LE - diffuse eyrthema of B LE w/ multiple cutaneous wounds  Neuro:  [place] - follows simple commands - 4/5 strength equally in all extremities - CN II-XII intact B - EOMI   Data Reviewed: Basic Metabolic Panel:  Recent Labs Lab 04/11/2014 1550 04/12/2014 1806 04/03/2014 1850 03/20/2014 2234 04/10/14 0324 04/10/14 0710  NA 134* 133*  --   --  137 135  K 6.3* 6.0*  --   --  5.8* 5.3*  CL 99 103  --   --  100 97  CO2 14*  --   --   --  16* 18*  GLUCOSE 263* 248* 283*  --  275* 232*  BUN 77* 74*  --   --  83* 84*  CREATININE 8.01* 7.40*  --  7.86* 8.01* 7.92*  CALCIUM 7.9*  --   --   --  7.6* 7.1*  PHOS  --   --   --   --   --  10.9*    Liver Function Tests:  Recent Labs Lab 04/16/2014 1550 04/10/14 0324 04/10/14 0710  AST 904* 2368*  --   ALT 339* 929*  --   ALKPHOS 207* 180*  --   BILITOT 1.4* 1.2  --   PROT 6.0 6.0  --   ALBUMIN 1.9* 1.7* 1.7*    Recent Labs Lab 03/30/2014 1850  LIPASE 20    Recent Labs Lab 04/02/2014 1930  AMMONIA 74*    Coags:  Recent Labs Lab 04/10/14 0350  INR 2.13*   CBC:  Recent Labs Lab 03/21/2014 1550 04/04/2014 1806 04/04/2014 2234 04/10/14 0324 04/10/14 0710  WBC 11.9*  --  12.9* 11.2* 11.9*  NEUTROABS 10.8*  --   --  9.8*  --   HGB 9.5* 12.6 9.0* 9.3* 9.0*  HCT 32.7* 37.0 31.1* 31.8* 31.0*  MCV 94.8  --  93.4 93.0 92.8  PLT 449*  --  420* 431* 425*    Cardiac Enzymes:  Recent Labs Lab 04/12/2014 1850 04/13/2014 2234 04/10/14 0350  TROPONINI 1.19* 1.42* 1.30*   BNP (last 3 results)  Recent Labs  08/13/13 1949 09/06/13 0740  PROBNP 34187.0* 28622.0*    CBG:  Recent Labs Lab 04/08/2014 1846 04/08/2014 2243 04/10/14 0017 04/10/14 0401    GLUCAP 279* 286* 302* 224*    Recent Results (from the past 240 hour(s))  Blood Culture (routine x 2)     Status: None (Preliminary result)   Collection Time: 04/14/2014  3:50 PM  Result Value Ref Range Status   Specimen Description BLOOD ARM LEFT  Final   Special Requests BOTTLES DRAWN AEROBIC AND ANAEROBIC 5CC  Final   Culture  Setup Time   Final    03/29/2014 21:15 Performed at [facility]    Culture   Final           BLOOD CULTURE  RECEIVED NO GROWTH TO DATE CULTURE WILL BE HELD FOR 5 DAYS BEFORE ISSUING A FINAL NEGATIVE REPORT Performed at [facility]    Report Status PENDING  Incomplete  Blood Culture (routine x 2)     Status: None (Preliminary result)   Collection Time: 03/22/2014  5:25 PM  Result Value Ref Range Status   Specimen Description BLOOD FOREARM RIGHT  Final   Special Requests BOTTLES DRAWN AEROBIC ONLY 6CC  Final   Culture  Setup Time   Final    04/18/2014 22:36 Performed at [facility]    Culture   Final           BLOOD CULTURE RECEIVED NO GROWTH TO DATE CULTURE WILL BE HELD FOR 5 DAYS BEFORE ISSUING A FINAL NEGATIVE REPORT Performed at [facility]    Report Status PENDING  Incomplete  MRSA PCR Screening     Status: None   Collection Time: 04/06/2014 11:30 PM  Result Value Ref Range Status   MRSA by PCR NEGATIVE NEGATIVE Final    Comment:        The GeneXpert MRSA Assay (FDA approved for NASAL specimens only), is one component of a comprehensive MRSA colonization surveillance program. It is not intended to diagnose MRSA infection nor to guide or monitor treatment for MRSA infections.      Studies:  Recent x-ray studies have been reviewed in detail by the Attending Physician  Scheduled Meds:  Scheduled Meds: . aspirin EC  325 mg Oral Daily  . cinacalcet  30 mg Oral Q supper  . Darbepoetin Alfa      . darbepoetin (ARANESP) injection - DIALYSIS  100 mcg Intravenous Q Wed-HD  . ferric gluconate  (FERRLECIT/NULECIT) IV  125 mg Intravenous Q M,W,F-HD  . heparin  5,000 Units Subcutaneous 3 times per day  . insulin aspart  0-9 Units Subcutaneous 6 times per day  . insulin detemir  20 Units Subcutaneous QPM  . multivitamin  1 tablet Oral QHS  . piperacillin-tazobactam (ZOSYN)  IV  2.25 g Intravenous Q8H  . sodium chloride  3 mL Intravenous Q12H  . sodium chloride  3 mL Intravenous Q12H  . vancomycin  1,000 mg Intravenous Once  . vancomycin  1,000 mg Intravenous Q M,W,F-HD    Time spent on care of this patient: 35 mins   Jobina Maita T , MD   [facility] Office  [phone] Pager - Text Page per Shea Evans as per below:  On-Call/Text Page:      [email or web address]      password [email or web address]  If 7PM-7AM, please contact night-coverage [email or web address] Password [email or web address] 04/10/2014, 9:29 AM   LOS: 1 day

## 2014-04-10 NOTE — Evaluation (Signed)
Discussed overall case w/ CCM.  Reviewed labs- assess pt through elink.  Currently agrees with treatment plan  Will follow closely in box.

## 2014-04-10 NOTE — Progress Notes (Signed)
[facility] Progress Note Patient Name: Hannah [name] DOB: [birth year] MRN: [id number]   Date of Service  04/10/2014  HPI/Events of Note  ESRD with severe sepsis ?biliary vs other source, encephalopathy - ?dilaudid related vs sepsis Elevated troponin   eICU Interventions  Lactate cleared - Volume status and tissue perfusion was assessed clinically by examination of cardiopulmonary system, good peripheral pulses, pink skin color  and mucous membranes. Empiric abx Trial of narcan if mental status worsens     Intervention Category Evaluation Type: New Patient Evaluation  ALVA,RAKESH V. 04/10/2014, 1:00 AM

## 2014-04-10 NOTE — Consult Note (Signed)
Subjective:   HPI  The patient is a 43 year old female with multiple medical problems as described under her past medical history. She was admitted to the hospital after coming to the emergency room with complaints of feeling extremely weak over several days. She was unable to go to dialysis because she was so weak. She was admitted with a diagnosis of sepsis with lactic acidosis. She was also felt to have a toxic metabolic encephalopathy in the setting of sepsis. We are asked to see her because she was found to have markedly elevated liver enzymes. In toxin to the patient she denies any history of liver disease. She denies ever having had hepatitis. She denies drinking alcohol. A CT scan of the abdomen was done and the liver is reported to appear normal. She does have gallstones. There is no mention of dilated bile ducts. She has not been experiencing symptoms to suggest biliary colic in talking to her. Her total bilirubin was 1.4, alkaline phosphatase 207, ALT 339, AST 904. Serum ammonia is elevated at 74. Prothrombin time is also elevated. I do not see where she takes Coumadin.  Review of Systems She currently denies chest pain or shortness of breath  Past Medical History  Diagnosis Date  . High cholesterol   . Diabetic retinopathy   . Peripheral neuropathy     "tips of toes"  . Blind right eye   . CHF (congestive heart failure)   . CAD (coronary artery disease)   . GERD (gastroesophageal reflux disease)   . Hypertension   . History of lung cancer 07/2011    s/p left lower lobectomy  . Diabetes mellitus     IDDM  . Cataract of right eye   . Ulcer of toe of right foot 07/10/2012    great toe  . Breast calcification, left 06/2012  . Acute biphenotypic leukemia   . CKD (chronic kidney disease) stage 5, GFR less than 15 ml/min   . Nephrotic syndrome   . Gastritis     H/o gastritis on prior endoscopy  . Anemia   . Carotid artery disease   . Gallstones   . Myocardial infarction 5/16    Past Surgical History  Procedure Laterality Date  . Cardiac catheterization  07/16/2011  . Incision and drainage breast abscess Left   . Tubal ligation  1994  . Vitrectomy  2010    2 on left, 1 on right  . Cesarean section  1991; 1994  . Video assisted thoracoscopy (vats)/ lobectomy Left 07/30/2011    left main thoracotomy, left lower lobectomy, mediastinal lymph node dissection  . Lobectomy    . Colonoscopy with esophagogastroduodenoscopy (egd) N/A 08/14/2012    Procedure: COLONOSCOPY WITH ESOPHAGOGASTRODUODENOSCOPY (EGD);  Surgeon: Danie Binder, MD;  [facility];  Service: Endoscopy;  Laterality: N/A;  10:45-moved to [address] to notify pt  . Breast lumpectomy with needle localization Left 11/14/2012    Procedure: BREAST LUMPECTOMY WITH NEEDLE LOCALIZATION;  Surgeon: Marcello Moores A. Cornett, MD;  Location: [place];  Service: General;  Laterality: Left;  . Insertion of dialysis catheter Left 09/12/2013    Procedure: INSERTION OF DIALYSIS CATHETER;  Surgeon: Mal Misty, MD;  Location: [place];  Service: Vascular;  Laterality: Left;  . Embolectomy Right 09/17/2013    Procedure: Thrombectomy of Right Common Femoral Artery;  Surgeon: Elam Dutch, MD;  Location: [facility] OR;  Service: Vascular;  Laterality: Right;  . Endarterectomy femoral Right 09/17/2013    Procedure: Right Femoral  Endarterectomy;  Surgeon: Elam Dutch, MD;  Location: [facility] OR;  Service: Vascular;  Laterality: Right;  . Patch angioplasty Right 09/17/2013    Procedure: Vein Patch Angioplasty of Right Femoral Artery;  Surgeon: Elam Dutch, MD;  Location: [place];  Service: Vascular;  Laterality: Right;  . Fasciotomy Right 09/17/2013    Procedure: Four Compartment Fasciotomy;  Surgeon: Elam Dutch, MD;  Location: [place];  Service: Vascular;  Laterality: Right;  . Fasciotomy closure Right 09/19/2013    Procedure: FASCIOTOMY CLOSURE;  Surgeon: Elam Dutch, MD;  Location: [place];  Service: Vascular;  Laterality:  Right;  regional block and monitored anesthesia care used  . I&d extremity Right 09/28/2013    Procedure: IRRIGATION AND DEBRIDEMENT EXTREMITY;  Surgeon: Serafina Mitchell, MD;  Location: [place];  Service: Vascular;  Laterality: Right;  . Application of wound vac Right 09/28/2013    Procedure: APPLICATION OF WOUND VAC;  Surgeon: Serafina Mitchell, MD;  Location: [place];  Service: Vascular;  Laterality: Right;  . Av fistula placement Right 01/03/2014    Procedure: ARTERIOVENOUS (AV) FISTULA CREATION;  Surgeon: Angelia Mould, MD;  Location: [place];  Service: Vascular;  Laterality: Right;  . Ligation of arteriovenous  fistula Right 01/04/2014    Procedure: LIGATION OF ARTERIOVENOUS  FISTULA;  Surgeon: Rosetta Posner, MD;  Location: [place];  Service: Vascular;  Laterality: Right;  . Left heart catheterization with coronary angiogram N/A 09/04/2013    Procedure: LEFT HEART CATHETERIZATION WITH CORONARY ANGIOGRAM;  Surgeon: Peter M Martinique, MD;  Location: [facility] CATH LAB;  Service: Cardiovascular;  Laterality: N/A;  . Intra-aortic balloon pump insertion N/A 09/13/2013    Procedure: INTRA-AORTIC BALLOON PUMP INSERTION;  Surgeon: Jolaine Artist, MD;  Location: [facility] CATH LAB;  Service: Cardiovascular;  Laterality: N/A;  . Right heart catheterization  09/13/2013    Procedure: RIGHT HEART CATH;  Surgeon: Jolaine Artist, MD;  Location: [facility] CATH LAB;  Service: Cardiovascular;;   History   Social History  . Marital Status: Married    Spouse Name: N/A    Number of Children: 3  . Years of Education: N/A   Occupational History  . Not on file.   Social History Main Topics  . Smoking status: Former Smoker -- 2.00 packs/day for 30 years    Quit date: 08/01/2011  . Smokeless tobacco: Never Used  . Alcohol Use: No  . Drug Use: No  . Sexual Activity: Yes   Other Topics Concern  . Not on file   Social History Narrative   family history includes Asthma in her father; COPD in her father; Cancer in her maternal  aunt and mother; Congestive Heart Failure in her father; Coronary artery disease in her father; Diabetes in her brother, father, mother, and sister; Heart attack in her father; Heart disease in her father; Hyperlipidemia in her father and mother; Hypertension in her brother, father, and mother; Lung cancer in her maternal grandmother; Peripheral vascular disease in her father. There is no history of Colon cancer, Celiac disease, Crohn's disease, or Ulcerative colitis. Current facility-administered medications: cinacalcet (SENSIPAR) tablet 30 mg, 30 mg, Oral, Q supper, Windy Kalata, MD;  Darbepoetin Alfa (ARANESP) injection 100 mcg, 100 mcg, Intravenous, Q Wed-HD, Windy Kalata, MD, 100 mcg at 04/10/14 0932;  ferric gluconate (NULECIT) 125 mg in sodium chloride 0.9 % 100 mL IVPB, 125 mg, Intravenous, Q M,W,F-HD, [name], PA-C, 125 mg at 04/10/14 0932 insulin aspart (novoLOG) injection  0-20 Units, 0-20 Units, Subcutaneous, 6 times per day, Cherene Altes, MD;  insulin aspart (novoLOG) injection 0-5 Units, 0-5 Units, Subcutaneous, QHS, Cherene Altes, MD;  insulin detemir (LEVEMIR) injection 20 Units, 20 Units, Subcutaneous, QPM, Shanda Howells, MD;  midodrine (PROAMATINE) tablet 10 mg, 10 mg, Oral, Q dialysis, Windy Kalata, MD multivitamin (RENA-VIT) tablet 1 tablet, 1 tablet, Oral, QHS, Windy Kalata, MD;  piperacillin-tazobactam (ZOSYN) IVPB 2.25 g, 2.25 g, Intravenous, Q8H, Rebecka Apley, RPH, 2.25 g at 04/10/14 0024;  polyvinyl alcohol (LIQUIFILM TEARS) 1.4 % ophthalmic solution 1 drop, 1 drop, Both Eyes, QHS PRN, Cherene Altes, MD;  sevelamer carbonate (RENVELA) tablet 2,400 mg, 2,400 mg, Oral, TID WC, Cherene Altes, MD vancomycin (VANCOCIN) IVPB 1000 mg/200 mL premix, 1,000 mg, Intravenous, Once, Rebecka Apley, RPH;  vancomycin (VANCOCIN) IVPB 1000 mg/200 mL premix, 1,000 mg, Intravenous, Q M,W,F-HD, Rebecka Apley, RPH, 1,000 mg at 04/10/14 1015 Allergies   Allergen Reactions  . Crestor [Rosuvastatin] Other (See Comments)    Severe muscle weakness  . Nsaids Other (See Comments)    Not allergic, "bad on my kidneys"  . Ciprofloxacin Rash     Objective:     BP 113/72 mmHg  Pulse 116  Temp(Src) 98 F (36.7 C) (Oral)  Resp 12  Ht 5\' 7"  (1.702 m)  Wt 120 kg (264 lb 8.8 oz)  BMI 41.42 kg/m2  SpO2 100%  LMP 09/17/2013 (Approximate)  Her mentation is somewhat slowed and she appears slightly confused and not soft to sleep.  Nonicteric  Heart regular rhythm with tachycardia, it sounds like there is a pericardial friction rub,  Lungs coarse breath sounds  Abdomen: Bowel sounds are present, soft, bilateral lower and mid abdominal Woody firm feeling abdomen, prior diagnosis of caliphylaxis. Black eschar on skin left lower quadrant  Laboratory No components found for: D1    Assessment:     #1. Sepsis syndrome  #2. Elevated liver enzymes in the setting of sepsis syndrome. The pattern of enzyme elevation is more consistent with a hepatocellular pattern rather than an obstructive pattern.  #3. Cholelithiasis \ #4 Multiple medical problems as mentioned under PMH     Plan:     Check hepatitis panel. Treat sepsis and other underlying medical problems.Follow LFTs. Currently I do not think her elevated liver enzymes are related to choledocholithiasis. We will follow.  Lab Results  Component Value Date   HGB 9.0* 04/10/2014   HGB 9.3* 04/10/2014   HGB 9.0* 04/03/2014   HCT 31.0* 04/10/2014   HCT 31.8* 04/10/2014   HCT 31.1* 04/08/2014   ALKPHOS 180* 04/10/2014   ALKPHOS 207* 03/28/2014   ALKPHOS 162* 02/24/2014   AST 2368* 04/10/2014   AST 904* 04/16/2014   AST 21 02/24/2014   ALT 929* 04/10/2014   ALT 339* 04/16/2014   ALT 22 02/24/2014

## 2014-04-10 NOTE — Progress Notes (Signed)
Heart rate sustained on 120's sinus tach, Md made aware with order, lopressor iv given, continue to monitor.

## 2014-04-10 NOTE — Procedures (Signed)
I have seen and examined this patient and agree with the plan of care .  Patient seen on dialysis  [facility] W 04/10/2014, 9:06 AM

## 2014-04-10 NOTE — Progress Notes (Signed)
Echocardiogram 2D Echocardiogram limited with Definity has been performed.  Hannah [name] 04/10/2014, 4:35 PM

## 2014-04-11 DIAGNOSIS — R740 Nonspecific elevation of levels of transaminase and lactic acid dehydrogenase [LDH]: Secondary | ICD-10-CM

## 2014-04-11 DIAGNOSIS — N390 Urinary tract infection, site not specified: Secondary | ICD-10-CM

## 2014-04-11 DIAGNOSIS — G9340 Encephalopathy, unspecified: Secondary | ICD-10-CM

## 2014-04-11 LAB — COMPREHENSIVE METABOLIC PANEL
ALBUMIN: 1.4 g/dL — AB (ref 3.5–5.2)
ALK PHOS: 155 U/L — AB (ref 39–117)
ALT: 817 U/L — AB (ref 0–35)
AST: 1072 U/L — AB (ref 0–37)
Anion gap: 15 (ref 5–15)
BILIRUBIN TOTAL: 1.2 mg/dL (ref 0.3–1.2)
BUN: 56 mg/dL — ABNORMAL HIGH (ref 6–23)
CHLORIDE: 103 meq/L (ref 96–112)
CO2: 22 mmol/L (ref 19–32)
Calcium: 7.3 mg/dL — ABNORMAL LOW (ref 8.4–10.5)
Creatinine, Ser: 6.04 mg/dL — ABNORMAL HIGH (ref 0.50–1.10)
GFR calc Af Amer: 9 mL/min — ABNORMAL LOW (ref >90)
GFR calc non Af Amer: 8 mL/min — ABNORMAL LOW (ref >90)
Glucose, Bld: 114 mg/dL — ABNORMAL HIGH (ref 70–99)
POTASSIUM: 4.2 mmol/L (ref 3.5–5.1)
Sodium: 140 mmol/L (ref 135–145)
Total Protein: 5 g/dL — ABNORMAL LOW (ref 6.0–8.3)

## 2014-04-11 LAB — PROTIME-INR
INR: 2.54 — ABNORMAL HIGH (ref 0.00–1.49)
Prothrombin Time: 27.6 seconds — ABNORMAL HIGH (ref 11.6–15.2)

## 2014-04-11 LAB — CBC
HCT: 33 % — ABNORMAL LOW (ref 36.0–46.0)
HEMOGLOBIN: 9.7 g/dL — AB (ref 12.0–15.0)
MCH: 27 pg (ref 26.0–34.0)
MCHC: 29.4 g/dL — ABNORMAL LOW (ref 30.0–36.0)
MCV: 91.9 fL (ref 78.0–100.0)
Platelets: 397 10*3/uL (ref 150–400)
RBC: 3.59 MIL/uL — AB (ref 3.87–5.11)
RDW: 19.5 % — ABNORMAL HIGH (ref 11.5–15.5)
WBC: 11.6 10*3/uL — ABNORMAL HIGH (ref 4.0–10.5)

## 2014-04-11 LAB — GLUCOSE, CAPILLARY
GLUCOSE-CAPILLARY: 106 mg/dL — AB (ref 70–99)
GLUCOSE-CAPILLARY: 149 mg/dL — AB (ref 70–99)
GLUCOSE-CAPILLARY: 103 mg/dL — AB (ref 70–99)
Glucose-Capillary: 108 mg/dL — ABNORMAL HIGH (ref 70–99)
Glucose-Capillary: 162 mg/dL — ABNORMAL HIGH (ref 70–99)
Glucose-Capillary: 137 mg/dL — ABNORMAL HIGH (ref 70–99)

## 2014-04-11 LAB — AMMONIA: AMMONIA: 41 umol/L — AB (ref 11–32)

## 2014-04-11 LAB — APTT: aPTT: 39 seconds — ABNORMAL HIGH (ref 24–37)

## 2014-04-11 MED ORDER — MIDODRINE HCL 5 MG PO TABS
5.0000 mg | ORAL_TABLET | Freq: Every day | ORAL | Status: DC
  Filled 2014-04-11: qty 1

## 2014-04-11 MED ORDER — MORPHINE SULFATE 2 MG/ML IJ SOLN
2.0000 mg | Freq: Four times a day (QID) | INTRAMUSCULAR | Status: DC | PRN
  Administered 2014-04-11 – 2014-04-14 (×8): 2 mg via INTRAVENOUS
  Administered 2014-04-14: 1 mg via INTRAVENOUS
  Administered 2014-04-14: 2 mg via INTRAVENOUS
  Filled 2014-04-11 (×11): qty 1

## 2014-04-11 MED ORDER — SILVER SULFADIAZINE 1 % EX CREA
TOPICAL_CREAM | Freq: Every day | CUTANEOUS | Status: DC
  Administered 2014-04-11 – 2014-04-14 (×4): via TOPICAL
  Filled 2014-04-11 (×2): qty 85

## 2014-04-11 MED ORDER — MIDODRINE HCL 5 MG PO TABS
5.0000 mg | ORAL_TABLET | Freq: Two times a day (BID) | ORAL | Status: DC
  Administered 2014-04-11 – 2014-04-12 (×2): 5 mg via ORAL
  Filled 2014-04-11 (×4): qty 1

## 2014-04-11 MED ORDER — METOPROLOL TARTRATE 1 MG/ML IV SOLN
2.5000 mg | Freq: Four times a day (QID) | INTRAVENOUS | Status: DC
  Administered 2014-04-11 (×2): 2.5 mg via INTRAVENOUS
  Filled 2014-04-11 (×4): qty 5

## 2014-04-11 MED ORDER — MIDODRINE HCL 5 MG PO TABS
5.0000 mg | ORAL_TABLET | ORAL | Status: DC
  Filled 2014-04-11: qty 1

## 2014-04-11 MED ORDER — MIDODRINE HCL 5 MG PO TABS
5.0000 mg | ORAL_TABLET | Freq: Every day | ORAL | Status: DC
  Administered 2014-04-11: 5 mg via ORAL
  Filled 2014-04-11: qty 1

## 2014-04-11 NOTE — Progress Notes (Signed)
Patient ID: Hannah [name], female   DOB: [birth year], 43 y.o.   MRN: [id number] [facility] Gastroenterology Progress Note  Hannah [name] 43 y.o. [birth year]   Subjective: Nurse in room and states patient just had brown stool. Patient denies abdominal pain stating that it hurts only when pushed on.  Objective: Vital signs in last 24 hours: Filed Vitals:   04/11/14 1200  BP: 94/60  Pulse: 106  Temp: 98.5 F (36.9 C)  Resp: 10    Physical Exam: Gen: lethargic, obese, no acute distress Abd: diffusely tender with voluntary guarding, soft, nondistended, +BS, obese  Lab Results:  Recent Labs  04/10/14 0710 04/11/14 0320  NA 135 140  K 5.3* 4.2  CL 97 103  CO2 18* 22  GLUCOSE 232* 114*  BUN 84* 56*  CREATININE 7.92* 6.04*  CALCIUM 7.1* 7.3*  PHOS 10.9*  --     Recent Labs  04/10/14 0324 04/10/14 0710 04/11/14 0320  AST 2368*  --  1072*  ALT 929*  --  817*  ALKPHOS 180*  --  155*  BILITOT 1.2  --  1.2  PROT 6.0  --  5.0*  ALBUMIN 1.7* 1.7* 1.4*    Recent Labs  03/25/2014 1550  04/10/14 0324 04/10/14 0710 04/11/14 0320  WBC 11.9*  < > 11.2* 11.9* 11.6*  NEUTROABS 10.8*  --  9.8*  --   --   HGB 9.5*  < > 9.3* 9.0* 9.7*  HCT 32.7*  < > 31.8* 31.0* 33.0*  MCV 94.8  < > 93.0 92.8 91.9  PLT 449*  < > 431* 425* 397  < > = values in this interval not displayed.  Recent Labs  04/10/14 0350 04/11/14 0320  LABPROT 24.0* 27.6*  INR 2.13* 2.54*      Assessment/Plan: Sepsis question urosepsis. Shock liver improving. Doubt primary biliary source for sepsis. On broad spectrum antibiotics. Supportive care. Will sign off. Call if questions.   [place] C. 04/11/2014, 2:26 PM

## 2014-04-11 NOTE — Progress Notes (Signed)
[facility] ROUNDING NOTE   Subjective:   Interval History: some abdominal pain  Very low blood pressures  These are chronic and midodrine has been restarted  Objective:  Vital signs in last 24 hours:  Temp:  [97.2 F (36.2 C)-98.9 F (37.2 C)] 97.5 F (36.4 C) (12/24 0753) Pulse Rate:  [91-120] 96 (12/24 0800) Resp:  [8-17] 9 (12/24 0800) BP: (69-120)/(28-72) 83/42 mmHg (12/24 0800) SpO2:  [94 %-100 %] 99 % (12/24 0800) Weight:  [118.3 kg (260 lb 12.9 oz)-120 kg (264 lb 8.8 oz)] 118.3 kg (260 lb 12.9 oz) (12/24 0338)  Weight change: 2.065 kg (4 lb 8.8 oz) Filed Weights   04/10/14 0700 04/10/14 1049 04/11/14 0338  Weight: 122 kg (268 lb 15.4 oz) 120 kg (264 lb 8.8 oz) 118.3 kg (260 lb 12.9 oz)    Intake/Output: I/O last 3 completed shifts: In: 480 [I.V.:30; IV Piggyback:450] Out: 3976 [Other:1709]   Intake/Output this shift:  Total I/O In: 79 [IV Piggyback:50] Out: -   CVS- RRR   IJ catheter RS- CTA diminished ABD- tender diffusely EXT- no edema   Basic Metabolic Panel:  Recent Labs Lab 03/24/2014 1550 03/29/2014 1806 03/27/2014 1850 04/12/2014 2234 04/10/14 0324 04/10/14 0710 04/11/14 0320  NA 134* 133*  --   --  137 135 140  K 6.3* 6.0*  --   --  5.8* 5.3* 4.2  CL 99 103  --   --  100 97 103  CO2 14*  --   --   --  16* 18* 22  GLUCOSE 263* 248* 283*  --  275* 232* 114*  BUN 77* 74*  --   --  83* 84* 56*  CREATININE 8.01* 7.40*  --  7.86* 8.01* 7.92* 6.04*  CALCIUM 7.9*  --   --   --  7.6* 7.1* 7.3*  PHOS  --   --   --   --   --  10.9*  --     Liver Function Tests:  Recent Labs Lab 04/13/2014 1550 04/10/14 0324 04/10/14 0710 04/11/14 0320  AST 904* 2368*  --  1072*  ALT 339* 929*  --  817*  ALKPHOS 207* 180*  --  155*  BILITOT 1.4* 1.2  --  1.2  PROT 6.0 6.0  --  5.0*  ALBUMIN 1.9* 1.7* 1.7* 1.4*    Recent Labs Lab 03/19/2014 1850  LIPASE 20    Recent Labs Lab 04/08/2014 1930 04/11/14 0320  AMMONIA 74* 41*    CBC:  Recent  Labs Lab 03/31/2014 1550 03/30/2014 1806 04/08/2014 2234 04/10/14 0324 04/10/14 0710 04/11/14 0320  WBC 11.9*  --  12.9* 11.2* 11.9* 11.6*  NEUTROABS 10.8*  --   --  9.8*  --   --   HGB 9.5* 12.6 9.0* 9.3* 9.0* 9.7*  HCT 32.7* 37.0 31.1* 31.8* 31.0* 33.0*  MCV 94.8  --  93.4 93.0 92.8 91.9  PLT 449*  --  420* 431* 425* 397    Cardiac Enzymes:  Recent Labs Lab 04/02/2014 1850 04/13/2014 2234 04/10/14 0350  TROPONINI 1.19* 1.42* 1.30*    BNP: Invalid input(s): POCBNP  CBG:  Recent Labs Lab 04/10/14 1655 04/10/14 2021 04/10/14 2329 04/11/14 0333 04/11/14 0750  GLUCAP 119* 128* 117* 108* 106*    Microbiology: Results for orders placed or performed during the hospital encounter of 04/05/2014  Blood Culture (routine x 2)     Status: None (Preliminary result)   Collection Time: 03/26/2014  3:50 PM  Result  Value Ref Range Status   Specimen Description BLOOD ARM LEFT  Final   Special Requests BOTTLES DRAWN AEROBIC AND ANAEROBIC 5CC  Final   Culture  Setup Time   Final    03/28/2014 21:15 Performed at [facility]    Culture   Final           BLOOD CULTURE RECEIVED NO GROWTH TO DATE CULTURE WILL BE HELD FOR 5 DAYS BEFORE ISSUING A FINAL NEGATIVE REPORT Performed at [facility]    Report Status PENDING  Incomplete  Blood Culture (routine x 2)     Status: None (Preliminary result)   Collection Time: 04/07/2014  5:25 PM  Result Value Ref Range Status   Specimen Description BLOOD FOREARM RIGHT  Final   Special Requests BOTTLES DRAWN AEROBIC ONLY [place]  Final   Culture  Setup Time   Final    03/28/2014 22:36 Performed at [facility]    Culture   Final           BLOOD CULTURE RECEIVED NO GROWTH TO DATE CULTURE WILL BE HELD FOR 5 DAYS BEFORE ISSUING A FINAL NEGATIVE REPORT Performed at [facility]    Report Status PENDING  Incomplete  Urine culture     Status: None   Collection Time: 03/24/2014  6:20 PM  Result Value Ref Range Status   Specimen  Description URINE, CATHETERIZED  Final   Special Requests NONE  Final   Culture  Setup Time   Final    04/10/2014 01:21 Performed at [place] Performed at [facility]   Final   Culture NO GROWTH Performed at [facility]   Final   Report Status 04/10/2014 FINAL  Final  MRSA PCR Screening     Status: None   Collection Time: 03/29/2014 11:30 PM  Result Value Ref Range Status   MRSA by PCR NEGATIVE NEGATIVE Final    Comment:        The GeneXpert MRSA Assay (FDA approved for NASAL specimens only), is one component of a comprehensive MRSA colonization surveillance program. It is not intended to diagnose MRSA infection nor to guide or monitor treatment for MRSA infections.     Coagulation Studies:  Recent Labs  04/10/14 0350 04/11/14 0320  LABPROT 24.0* 27.6*  INR 2.13* 2.54*    Urinalysis:  Recent Labs  03/29/2014 1820  COLORURINE RED*  LABSPEC 1.031*  PHURINE 5.0  GLUCOSEU 250*  HGBUR MODERATE*  BILIRUBINUR MODERATE*  KETONESUR 15*  PROTEINUR >300*  UROBILINOGEN 1.0  NITRITE POSITIVE*  LEUKOCYTESUR SMALL*      Imaging: Ct Abdomen Pelvis Wo Contrast  03/25/2014   CLINICAL DATA:  Fall 3 days ago.  Lethargy.  EXAM: CT ABDOMEN AND PELVIS WITHOUT CONTRAST  TECHNIQUE: Multidetector CT imaging of the abdomen and pelvis was performed following the standard protocol without IV contrast.  COMPARISON:  02/15/2014  FINDINGS: Lack of oral and intravenous contrast limits the exam. Motion artifact limits the exam. Overall, this is a severely limited examination.  Stable left lower lobe pulmonary nodule.  Stable left pleural effusion with features of loculation.  Stable small pericardial effusion.  Gallstones have developed.  Liver, spleen, pancreas are grossly within normal limits.  There is nonspecific stranding involving the left adrenal gland. Right adrenal gland is stable with some calcifications.  Lymph stones image  gastrohepatic ligament and about the aorta are stable compatible with mild adenopathy.  Stable appearance of  the kidneys.  Left renal cyst.  Bladder is decompressed.  There is stranding and soft tissue density within the right inguinal region which is stable. Stranding throughout the subcutaneous fat and intraperitoneal fat is present compatible with diffuse anasarca.  Atherosclerotic calcifications are present and stable  Uterus is unremarkable.  IMPRESSION: Cholelithiasis is now evident.  Diffuse anasarca.  Other findings are stable compared to the prior study. This includes a left lower lobe pulmonary nodule as well as abdominal adenopathy. There is also stranding within the soft tissues of the right inguinal region. An inflammatory process is not excluded.   Electronically Signed   By: Maryclare Bean M.D.   On: 03/31/2014 20:34   Ct Head Wo Contrast  03/31/2014   CLINICAL DATA:  Weakness  EXAM: CT HEAD WITHOUT CONTRAST  TECHNIQUE: Contiguous axial images were obtained from the base of the skull through the vertex without intravenous contrast.  COMPARISON:  07/28/2011  FINDINGS: No skull fracture is noted. Paranasal sinuses and mastoid air cells are unremarkable. Postsurgical high-density material noted right eye globe.  No intracranial hemorrhage, mass effect or midline shift. No acute cortical infarction. No mass lesion is noted on this unenhanced scan. The gray and white-matter differentiation is is preserved. Ventricular size is stable from prior exam.  IMPRESSION: No acute intracranial abnormality.  No significant change.   Electronically Signed   By: Lahoma Crocker M.D.   On: 04/13/2014 17:23   Dg Chest Port 1 View  (if Code Sepsis Called)  04/12/2014   CLINICAL DATA:  Lethargy, shortness of Breath  EXAM: PORTABLE CHEST - 1 VIEW  COMPARISON:  03/19/2014  FINDINGS: Cardiomegaly again noted. Dual lumen left IJ catheter is unchanged in position. Central mild vascular congestion without convincing pulmonary  edema. Mild left basilar atelectasis. No segmental infiltrate.  IMPRESSION: Cardiomegaly .Mild left basilar atelectasis. No segmental infiltrate. Central mild vascular congestion without pulmonary edema.   Electronically Signed   By: Lahoma Crocker M.D.   On: 03/22/2014 16:22     Medications:     . cinacalcet  30 mg Oral Q supper  . darbepoetin (ARANESP) injection - DIALYSIS  100 mcg Intravenous Q Wed-HD  . ferric gluconate (FERRLECIT/NULECIT) IV  125 mg Intravenous Q M,W,F-HD  . insulin aspart  0-20 Units Subcutaneous 6 times per day  . insulin aspart  0-5 Units Subcutaneous QHS  . insulin detemir  20 Units Subcutaneous QPM  . metoprolol  5 mg Intravenous 4 times per day  . midodrine  5 mg Oral Daily  . multivitamin  1 tablet Oral QHS  . piperacillin-tazobactam (ZOSYN)  IV  2.25 g Intravenous Q8H  . sevelamer carbonate  2,400 mg Oral TID WC  . vancomycin  1,000 mg Intravenous Q M,W,F-HD   morphine injection, polyvinyl alcohol  Assessment/ Plan:   ESRD- last dialysis 12/23  ANEMIA- Hb 9.3  MBD- calciphylaxis  Calcium on low side  HTN/VOL- no edema   ACCESS- IJ catheter  Wbc 11.2  Temperature  98.9   Zosyn and vancomycin    LOS: 2 Daltyn Degroat,[place] W @TODAY @10 :00 AM

## 2014-04-11 NOTE — Progress Notes (Addendum)
[facility]                                                                              Patient Demographics  Hannah [name], is a 43 y.o. female, DOB - [birth year], NIO:[id number]  Admit date - 03/28/2014   Admitting Physician Shanda Howells, MD  Outpatient Primary MD for the patient is Vic Blackbird, MD  LOS - 2   Chief Complaint  Patient presents with  . Fatigue      Admit HPI / Brief Narrative: 43 year old female witH history of ESRD on dialysis Monday Wednesday and Friday, insulin-dependent diabetes, calciphylaxis, morbid obesity, coronary artery disease status post MI, ischemic cardiomyopathy, and lung cancer status post lobectomy presenting with sepsis, encephalopathy, UTI, anasarca, elevated troponin, and signif transaminitis. Husband reported patient with generalized malaise and weakness over 4-5 days. Was unable to go to dialysis on Friday secondary to weakness. Also missed dialysis Monday. EMS was called to the home because of persistent lethargy.  In the ER temperature was 100, heart rate in the 90s to 120s, respirations in the 20s, blood pressure in the 90s to 140s. Satting 94% on room air. White blood cell count 11.9, hemoglobin 12.6, potassium 6.3. Creatinine 7.4. Bicarb 14. AST 904, ALT 340, ALP 207, t bili 1.4. Troponin 1.19. EKG sinus tachycardia. Urinalysis indicative of infection. Was given 1 L normal saline bolus in the ER. Also given Kayexalate oral. Chest x-ray noted cardiomegaly and left basilar atelectasis. Pulmonary vascular congestion. Head CT within normal limits. CT of abdomen and pelvis noted anasarca, cholelithiasis, abdominal adenopathy, and left lower lobe pulmonary nodule.  Assessment & Plan   Toxic metabolic encephalopathy -Likely multifactorial including sepsis, UTI, ESRD, hepatic cause -Improving, patient able to answer all questions -Patient has no focal deficits at this time, unlikely CVA  Sepsis secondary UTI vs ?cellulitis -Continue  vancomycin and Zosyn empirically -Patient does have hypotension however this does appear to be a long-standing chronic problem -Leukocytosis remains stable patient afebrile -Lactic acid trending downward from 4.35-2.4 -Blood cultures currently negative to date -Urine culture showed no growth, although UA showed many bacteria, in 7-10 WBC, positive nitrite small leukocyte -There was speculation of calciphylaxis, however patient did have biopsy. Spoke with Dr. Justin Mend who stated the biopsy did not show calciphylaxis.  Chronic hypotension -Midodrine restarted BID  Elevated troponin/history of coronary artery disease/ischemic cardiomyopathy -Patient supposedly refused CABG times -Elevated troponin secondary to demand ischemia -Cardiology consulted and signed off, feels patient will have a troponin leak due to her current disease state -Due to patient's hypotension, cannot place on ACE inhibitor -Due to NSAID allergy, cannot place patient on aspirin -Patient has an EF of 25-30% on echo in July 2015  Transaminitis/coagulopathy/hepatic encephalopathy/shock liver -Abdominal CT :cholelithiasis without evidence of cholecystitis -LFTs trending downward -Hepatitis panel negative -Tylenol level <10 -Gastroenterology consulted and signed off, felt supportive care and did not feel this was related to biliary source  Pericardial friction rub -Echocardiogram 12/23: EF of 25%  End-stage renal disease requiring hemodialysis -Patient dialyzes Monday Wednesday and Friday -Last dialysis was 04/10/2014 -Nephrology following and appreciated  Hyperkalemia -Resolved likely secondary to missed dialysis  Diabetes mellitus with retinopathy -Continue to monitor  CBGs and insulin sliding scale  Morbid obesity -BMI 42 -Patient will need outpatient follow-up to discuss lifestyle modifications  GERD  Anemia of chronic disease -Hemoglobin appears to be stable -Continue iron supplementation  History of  lung cancer status post left lower lobe lobectomy 2013  Code Status: Full  Family Communication: Husband at bedside  Disposition Plan: Admitted  Time Spent in minutes   30 minutes  Procedures  Echocardiogram  Consults   Cardiology Gastroenterology Nephrology PCCM via phone  DVT Prophylaxis  SCDs  Lab Results  Component Value Date   PLT 397 04/11/2014    Medications  Scheduled Meds: . cinacalcet  30 mg Oral Q supper  . darbepoetin (ARANESP) injection - DIALYSIS  100 mcg Intravenous Q Wed-HD  . ferric gluconate (FERRLECIT/NULECIT) IV  125 mg Intravenous Q M,W,F-HD  . insulin aspart  0-20 Units Subcutaneous 6 times per day  . insulin aspart  0-5 Units Subcutaneous QHS  . insulin detemir  20 Units Subcutaneous QPM  . metoprolol  5 mg Intravenous 4 times per day  . midodrine  5 mg Oral BID WC  . multivitamin  1 tablet Oral QHS  . piperacillin-tazobactam (ZOSYN)  IV  2.25 g Intravenous Q8H  . sevelamer carbonate  2,400 mg Oral TID WC  . silver sulfADIAZINE   Topical Daily  . vancomycin  1,000 mg Intravenous Q M,W,F-HD   Continuous Infusions:  PRN Meds:.morphine injection, polyvinyl alcohol  Antibiotics    Anti-infectives    Start     Dose/Rate Route Frequency Ordered Stop   04/10/14 1200  vancomycin (VANCOCIN) IVPB 1000 mg/200 mL premix     1,000 mg200 mL/hr over 60 Minutes Intravenous Every M-W-F (Hemodialysis) 03/20/2014 2240     04/10/14 0000  piperacillin-tazobactam (ZOSYN) IVPB 2.25 g     2.25 g100 mL/hr over 30 Minutes Intravenous Every 8 hours 03/20/2014 2240     04/10/2014 2245  vancomycin (VANCOCIN) IVPB 1000 mg/200 mL premix  Status:  Discontinued     1,000 mg200 mL/hr over 60 Minutes Intravenous  Once 04/18/2014 2240 04/10/14 1533   03/25/2014 1645  vancomycin (VANCOCIN) IVPB 1000 mg/200 mL premix     1,000 mg200 mL/hr over 60 Minutes Intravenous  Once 03/30/2014 1634 04/07/2014 1900   04/18/2014 1645  piperacillin-tazobactam (ZOSYN) IVPB 3.375 g     3.375 g100 mL/hr  over 30 Minutes Intravenous  Once 04/03/2014 1634 03/22/2014 1725      Subjective:   Hannah [name] seen and examined today.   Complains of abdominal pain.  Denies nausea, chest pain, SOB.    Objective:   Filed Vitals:   04/11/14 0800 04/11/14 0900 04/11/14 1000 04/11/14 1200  BP: 83/42  91/58 94/60  Pulse: 96 99 101 106  Temp:    98.5 F (36.9 C)  TempSrc:    Oral  Resp: 9  8 10   Height:      Weight:      SpO2: 99%  100% 100%    Wt Readings from Last 3 Encounters:  04/11/14 118.3 kg (260 lb 12.9 oz)  03/19/14 119.75 kg (264 lb)  03/19/14 119.795 kg (264 lb 1.6 oz)     Intake/Output Summary (Last 24 hours) at 04/11/14 1411 Last data filed at 04/11/14 0900  Gross per 24 hour  Intake    510 ml  Output      0 ml  Net    510 ml    Exam  General: Well developed, well nourished, NAD, appears stated age  HEENT: NCAT, mucous membranes moist.   Cardiovascular: S1 S2 auscultated, no rubs, murmurs or gallops. Regular rate and rhythm.  Respiratory: Clear to auscultation bilaterally with equal chest rise  Abdomen: Soft, obese, tender, nondistended, + bowel sounds, lesions and wounds noted to LLQ and RLQ with erythema, necrotic black eschar noted on LLQ  Extremities: warm dry without cyanosis clubbing. LE edema and erythema  Neuro: AAOx3, no focal deficits  Skin: multiple wounds noted on abdomin, erythema and wounds on LE B/L.    Data Review   Micro Results Recent Results (from the past 240 hour(s))  Blood Culture (routine x 2)     Status: None (Preliminary result)   Collection Time: 03/24/2014  3:50 PM  Result Value Ref Range Status   Specimen Description BLOOD ARM LEFT  Final   Special Requests BOTTLES DRAWN AEROBIC AND ANAEROBIC 5CC  Final   Culture  Setup Time   Final    04/06/2014 21:15 Performed at [facility]    Culture   Final           BLOOD CULTURE RECEIVED NO GROWTH TO DATE CULTURE WILL BE HELD FOR 5 DAYS BEFORE ISSUING A FINAL NEGATIVE  REPORT Performed at [facility]    Report Status PENDING  Incomplete  Blood Culture (routine x 2)     Status: None (Preliminary result)   Collection Time: 03/19/2014  5:25 PM  Result Value Ref Range Status   Specimen Description BLOOD FOREARM RIGHT  Final   Special Requests BOTTLES DRAWN AEROBIC ONLY [place]  Final   Culture  Setup Time   Final    03/24/2014 22:36 Performed at [facility]    Culture   Final           BLOOD CULTURE RECEIVED NO GROWTH TO DATE CULTURE WILL BE HELD FOR 5 DAYS BEFORE ISSUING A FINAL NEGATIVE REPORT Performed at [facility]    Report Status PENDING  Incomplete  Urine culture     Status: None   Collection Time: 03/25/2014  6:20 PM  Result Value Ref Range Status   Specimen Description URINE, CATHETERIZED  Final   Special Requests NONE  Final   Culture  Setup Time   Final    04/10/2014 01:21 Performed at [place] Performed at [facility]   Final   Culture NO GROWTH Performed at [facility]   Final   Report Status 04/10/2014 FINAL  Final  MRSA PCR Screening     Status: None   Collection Time: 04/10/2014 11:30 PM  Result Value Ref Range Status   MRSA by PCR NEGATIVE NEGATIVE Final    Comment:        The GeneXpert MRSA Assay (FDA approved for NASAL specimens only), is one component of a comprehensive MRSA colonization surveillance program. It is not intended to diagnose MRSA infection nor to guide or monitor treatment for MRSA infections.     Radiology Reports Ct Abdomen Pelvis Wo Contrast  04/01/2014   CLINICAL DATA:  Fall 3 days ago.  Lethargy.  EXAM: CT ABDOMEN AND PELVIS WITHOUT CONTRAST  TECHNIQUE: Multidetector CT imaging of the abdomen and pelvis was performed following the standard protocol without IV contrast.  COMPARISON:  02/15/2014  FINDINGS: Lack of oral and intravenous contrast limits the exam. Motion artifact limits the exam. Overall, this is a severely  limited examination.  Stable left lower lobe pulmonary nodule.  Stable left pleural effusion  with features of loculation.  Stable small pericardial effusion.  Gallstones have developed.  Liver, spleen, pancreas are grossly within normal limits.  There is nonspecific stranding involving the left adrenal gland. Right adrenal gland is stable with some calcifications.  Lymph stones image gastrohepatic ligament and about the aorta are stable compatible with mild adenopathy.  Stable appearance of the kidneys.  Left renal cyst.  Bladder is decompressed.  There is stranding and soft tissue density within the right inguinal region which is stable. Stranding throughout the subcutaneous fat and intraperitoneal fat is present compatible with diffuse anasarca.  Atherosclerotic calcifications are present and stable  Uterus is unremarkable.  IMPRESSION: Cholelithiasis is now evident.  Diffuse anasarca.  Other findings are stable compared to the prior study. This includes a left lower lobe pulmonary nodule as well as abdominal adenopathy. There is also stranding within the soft tissues of the right inguinal region. An inflammatory process is not excluded.   Electronically Signed   By: Maryclare Bean M.D.   On: 04/04/2014 20:34   Dg Chest 2 View  03/20/2014   CLINICAL DATA:  Productive cough and back pain for 2 weeks. Evaluate for pneumonia.  EXAM: CHEST  2 VIEW  COMPARISON:  12/28/2013; 10/06/2013  FINDINGS: Grossly unchanged enlarged cardiac silhouette and mediastinal contours. Stable positioning of support apparatus. Grossly unchanged bibasilar heterogeneous opacities, left greater than right, likely atelectasis. Pulmonary venous congestion without frank evidence of edema. Pleural parenchymal thickening about the bilateral major and the right minor fissures. No pleural effusion or pneumothorax. Unchanged bones.  IMPRESSION: 1. No acute cardiopulmonary disease. Specifically, no evidence of pneumonia. 2. Similar findings of  cardiomegaly, pulmonary venous congestion and bibasilar atelectasis.   Electronically Signed   By: Sandi Mariscal M.D.   On: 03/20/2014 08:18   Ct Head Wo Contrast  04/16/2014   CLINICAL DATA:  Weakness  EXAM: CT HEAD WITHOUT CONTRAST  TECHNIQUE: Contiguous axial images were obtained from the base of the skull through the vertex without intravenous contrast.  COMPARISON:  07/28/2011  FINDINGS: No skull fracture is noted. Paranasal sinuses and mastoid air cells are unremarkable. Postsurgical high-density material noted right eye globe.  No intracranial hemorrhage, mass effect or midline shift. No acute cortical infarction. No mass lesion is noted on this unenhanced scan. The gray and white-matter differentiation is is preserved. Ventricular size is stable from prior exam.  IMPRESSION: No acute intracranial abnormality.  No significant change.   Electronically Signed   By: Lahoma Crocker M.D.   On: 03/24/2014 17:23   Dg Chest Port 1 View  (if Code Sepsis Called)  03/29/2014   CLINICAL DATA:  Lethargy, shortness of Breath  EXAM: PORTABLE CHEST - 1 VIEW  COMPARISON:  03/19/2014  FINDINGS: Cardiomegaly again noted. Dual lumen left IJ catheter is unchanged in position. Central mild vascular congestion without convincing pulmonary edema. Mild left basilar atelectasis. No segmental infiltrate.  IMPRESSION: Cardiomegaly .Mild left basilar atelectasis. No segmental infiltrate. Central mild vascular congestion without pulmonary edema.   Electronically Signed   By: Lahoma Crocker M.D.   On: 03/24/2014 16:22    CBC  Recent Labs Lab 04/17/2014 1550 03/27/2014 1806 03/26/2014 2234 04/10/14 0324 04/10/14 0710 04/11/14 0320  WBC 11.9*  --  12.9* 11.2* 11.9* 11.6*  HGB 9.5* 12.6 9.0* 9.3* 9.0* 9.7*  HCT 32.7* 37.0 31.1* 31.8* 31.0* 33.0*  PLT 449*  --  420* 431* 425* 397  MCV 94.8  --  93.4 93.0 92.8 91.9  MCH 27.5  --  27.0 27.2 26.9 27.0  MCHC 29.1*  --  28.9* 29.2* 29.0* 29.4*  RDW 19.3*  --  19.2* 19.3* 19.5* 19.5*   LYMPHSABS 0.6*  --   --  0.8  --   --   MONOABS 0.5  --   --  0.5  --   --   EOSABS 0.0  --   --  0.0  --   --   BASOSABS 0.0  --   --  0.0  --   --     Chemistries   Recent Labs Lab 03/25/2014 1550 03/28/2014 1806 04/10/2014 1850 03/20/2014 2234 04/10/14 0324 04/10/14 0710 04/11/14 0320  NA 134* 133*  --   --  137 135 140  K 6.3* 6.0*  --   --  5.8* 5.3* 4.2  CL 99 103  --   --  100 97 103  CO2 14*  --   --   --  16* 18* 22  GLUCOSE 263* 248* 283*  --  275* 232* 114*  BUN 77* 74*  --   --  83* 84* 56*  CREATININE 8.01* 7.40*  --  7.86* 8.01* 7.92* 6.04*  CALCIUM 7.9*  --   --   --  7.6* 7.1* 7.3*  AST 904*  --   --   --  2368*  --  1072*  ALT 339*  --   --   --  929*  --  817*  ALKPHOS 207*  --   --   --  180*  --  155*  BILITOT 1.4*  --   --   --  1.2  --  1.2   ------------------------------------------------------------------------------------------------------------------ estimated creatinine clearance is 15.7 mL/min (by C-G formula based on Cr of 6.04). ------------------------------------------------------------------------------------------------------------------  Recent Labs  03/21/2014 2234  HGBA1C 7.7*   ------------------------------------------------------------------------------------------------------------------ No results for input(s): CHOL, HDL, LDLCALC, TRIG, CHOLHDL, LDLDIRECT in the last 72 hours. ------------------------------------------------------------------------------------------------------------------ No results for input(s): TSH, T4TOTAL, T3FREE, THYROIDAB in the last 72 hours.  Invalid input(s): FREET3 ------------------------------------------------------------------------------------------------------------------ No results for input(s): VITAMINB12, FOLATE, FERRITIN, TIBC, IRON, RETICCTPCT in the last 72 hours.  Coagulation profile  Recent Labs Lab 04/10/14 0350 04/11/14 0320  INR 2.13* 2.54*    No results for input(s): DDIMER in the  last 72 hours.  Cardiac Enzymes  Recent Labs Lab 04/18/2014 1850 03/27/2014 2234 04/10/14 0350  TROPONINI 1.19* 1.42* 1.30*   ------------------------------------------------------------------------------------------------------------------ Invalid input(s): POCBNP    Darra Rosa D.O. on 04/11/2014 at 2:11 PM  Between 7am to 7pm - Pager - [phone]  After 7pm go to [email or web address] - password [email or web address]  And look for the night coverage person covering for me after hours  [facility] Office  [phone]

## 2014-04-11 NOTE — Progress Notes (Signed)
Please see CHF service earlier this year. Severe 3 vessel disease without revascularization options. Will continue to have troponin leak with hemodynamic insults. EF appears stable at 25% with low cardiac output. Would not recommend advanced CHF measures given her underlying multivessel CAD, ESRD and multiple comorbidities. Cardiology will be available as needed.  Pixie Casino, MD, [facility] Attending Cardiologist Hannah [name]

## 2014-04-11 NOTE — Consult Note (Addendum)
WOC wound consult note Reason for Consult: Consult requested for bilat legs.  Pt does not answer questions but information obtained from husband at bedside.  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 Abd wound currently with dry intact eschar and there are no further plans for topical treatment, according to husband. All sites are painful to touch. Right foot dusky purple and mottled; no open wounds or drainage to this area, bedside nurse states doppler pulse has been obtained. Wound type: Abd 10X6cm, 100% dry eschar, no odor or drainage. Right calf 5X6cm 100% dry eschar, no odor or drainage. Left calf 5.5X14 cm with 20% red full thickness wound, 80% soft fluctuant eschar, small amt tan drainage, some odor.   Wound bed: Drainage (amount, consistency, odor) mod amt yellow drainage to left leg, no odor Periwound: Generalized edema and erythremia to BLE. Dressing procedure/placement/frequency: Dry wounds R/T cataphylaxis may remain open to air. Topical treatment is not effective for this complex medical condition. Silvadene to left leg since it is fluctuant and drainaing.  If aggressive plan of care is desired, please refer to [facility] team for further input. Please re-consult if further assistance is needed.  Thank-you,  Julien Girt MSN, [place], [place], [place], [place]

## 2014-04-12 LAB — GLUCOSE, CAPILLARY
GLUCOSE-CAPILLARY: 72 mg/dL (ref 70–99)
GLUCOSE-CAPILLARY: 214 mg/dL — AB (ref 70–99)
Glucose-Capillary: 95 mg/dL (ref 70–99)
Glucose-Capillary: 139 mg/dL — ABNORMAL HIGH (ref 70–99)
Glucose-Capillary: 180 mg/dL — ABNORMAL HIGH (ref 70–99)
Glucose-Capillary: 182 mg/dL — ABNORMAL HIGH (ref 70–99)

## 2014-04-12 LAB — BASIC METABOLIC PANEL
Anion gap: 17 — ABNORMAL HIGH (ref 5–15)
BUN: 70 mg/dL — AB (ref 6–23)
CALCIUM: 7 mg/dL — AB (ref 8.4–10.5)
CHLORIDE: 100 meq/L (ref 96–112)
CO2: 22 mmol/L (ref 19–32)
Creatinine, Ser: 6.96 mg/dL — ABNORMAL HIGH (ref 0.50–1.10)
GFR calc Af Amer: 7 mL/min — ABNORMAL LOW (ref >90)
GFR calc non Af Amer: 6 mL/min — ABNORMAL LOW (ref >90)
Glucose, Bld: 82 mg/dL (ref 70–99)
Potassium: 4.6 mmol/L (ref 3.5–5.1)
Sodium: 139 mmol/L (ref 135–145)

## 2014-04-12 LAB — LACTIC ACID, PLASMA: Lactic Acid, Venous: 1.4 mmol/L (ref 0.5–2.2)

## 2014-04-12 LAB — CBC
HEMATOCRIT: 35.1 % — AB (ref 36.0–46.0)
Hemoglobin: 10.5 g/dL — ABNORMAL LOW (ref 12.0–15.0)
MCH: 27.1 pg (ref 26.0–34.0)
MCHC: 29.9 g/dL — ABNORMAL LOW (ref 30.0–36.0)
MCV: 90.7 fL (ref 78.0–100.0)
Platelets: 378 10*3/uL (ref 150–400)
RBC: 3.87 MIL/uL (ref 3.87–5.11)
RDW: 19.7 % — ABNORMAL HIGH (ref 11.5–15.5)
WBC: 13.1 10*3/uL — AB (ref 4.0–10.5)

## 2014-04-12 LAB — PROTIME-INR
INR: 2.44 — AB (ref 0.00–1.49)
Prothrombin Time: 26.7 seconds — ABNORMAL HIGH (ref 11.6–15.2)

## 2014-04-12 MED ORDER — MIDODRINE HCL 5 MG PO TABS
5.0000 mg | ORAL_TABLET | Freq: Three times a day (TID) | ORAL | Status: DC
  Administered 2014-04-12 – 2014-04-14 (×5): 5 mg via ORAL
  Filled 2014-04-12 (×8): qty 1

## 2014-04-12 MED ORDER — VANCOMYCIN HCL IN DEXTROSE 1-5 GM/200ML-% IV SOLN
1000.0000 mg | INTRAVENOUS | Status: AC
  Administered 2014-04-13: 1000 mg via INTRAVENOUS
  Filled 2014-04-12 (×2): qty 200

## 2014-04-12 MED ORDER — HYDROCODONE-ACETAMINOPHEN 5-325 MG PO TABS
1.0000 | ORAL_TABLET | Freq: Four times a day (QID) | ORAL | Status: DC | PRN
  Administered 2014-04-12 – 2014-04-13 (×4): 1 via ORAL
  Filled 2014-04-12 (×4): qty 1

## 2014-04-12 NOTE — Progress Notes (Signed)
[facility]                                                                              Patient Demographics  Hannah [name], is a 43 y.o. female, DOB - October 23, 1970, DGL:[id number]  Admit date - 03/20/2014   Admitting Physician Shanda Howells, MD  Outpatient Primary MD for the patient is Vic Blackbird, MD  LOS - 3   Chief Complaint  Patient presents with  . Fatigue      Admit HPI / Brief Narrative: 43 year old female witH history of ESRD on dialysis Monday Wednesday and Friday, insulin-dependent diabetes, calciphylaxis, morbid obesity, coronary artery disease status post MI, ischemic cardiomyopathy, and lung cancer status post lobectomy presenting with sepsis, encephalopathy, UTI, anasarca, elevated troponin, and signif transaminitis. Husband reported patient with generalized malaise and weakness over 4-5 days. Was unable to go to dialysis on Friday secondary to weakness. Also missed dialysis Monday. EMS was called to the home because of persistent lethargy.  In the ER temperature was 100, heart rate in the 90s to 120s, respirations in the 20s, blood pressure in the 90s to 140s. Satting 94% on room air. White blood cell count 11.9, hemoglobin 12.6, potassium 6.3. Creatinine 7.4. Bicarb 14. AST 904, ALT 340, ALP 207, t bili 1.4. Troponin 1.19. EKG sinus tachycardia. Urinalysis indicative of infection. Was given 1 L normal saline bolus in the ER. Also given Kayexalate oral. Chest x-ray noted cardiomegaly and left basilar atelectasis. Pulmonary vascular congestion. Head CT within normal limits. CT of abdomen and pelvis noted anasarca, cholelithiasis, abdominal adenopathy, and left lower lobe pulmonary nodule.  Assessment & Plan   Toxic metabolic encephalopathy -Likely multifactorial including sepsis, UTI, ESRD, hepatic cause -Improving, patient able to answer all questions -Patient has no focal deficits at this time, unlikely CVA  Sepsis secondary UTI vs ?cellulitis -Continue  vancomycin and Zosyn empirically -Patient does have hypotension however this does appear to be a long-standing chronic problem -Continues to have leukocytosis however afebrile -Lactic acid trending downward from 4.35 to 1.4 -Blood cultures currently negative to date -Urine culture showed no growth, although UA showed many bacteria, in 7-10 WBC, positive nitrite small leukocyte -There was speculation of calciphylaxis, however patient did have biopsy. Spoke with Dr. Justin Mend who stated the biopsy did not show calciphylaxis. -Possible surgical consult for abdominal wounds however at this time patient states she would like to wait  Chronic hypotension -Midodrine restarted BID  Elevated troponin/history of coronary artery disease/ischemic cardiomyopathy -Patient supposedly refused CABG times -Elevated troponin secondary to demand ischemia -Cardiology consulted and signed off, feels patient will have a troponin leak due to her current disease state -Due to patient's hypotension, cannot place on ACE inhibitor -Due to NSAID allergy, cannot place patient on aspirin -Patient has an EF of 25-30% on echo in July 2015  Transaminitis/coagulopathy/hepatic encephalopathy/shock liver -Abdominal CT :cholelithiasis without evidence of cholecystitis -LFTs trending downward -Hepatitis panel negative -Tylenol level <10 -Gastroenterology consulted and signed off, felt supportive care and did not feel this was related to biliary source -We'll continue to monitor CMP and ammonia level  Pericardial friction rub -Echocardiogram 12/23: EF of 25%  End-stage renal disease requiring hemodialysis -Patient dialyzes  Monday Wednesday and Friday -Last dialysis was 04/10/2014 -Nephrology following and appreciated  Hyperkalemia -Resolved likely secondary to missed dialysis  Diabetes mellitus with retinopathy -Continue to monitor CBGs and insulin sliding scale  Morbid obesity -BMI 42 -Patient will need outpatient  follow-up to discuss lifestyle modifications  GERD  Anemia of chronic disease -Hemoglobin appears to be stable -Continue iron supplementation  History of lung cancer status post left lower lobe lobectomy 2013  Code Status: Full  Family Communication: Husband at bedside  Disposition Plan: Admitted  Time Spent in minutes   30 minutes  Procedures  Echocardiogram  Consults   Cardiology Gastroenterology Nephrology PCCM via phone  DVT Prophylaxis  SCDs  Lab Results  Component Value Date   PLT 378 04/12/2014    Medications  Scheduled Meds: . cinacalcet  30 mg Oral Q supper  . darbepoetin (ARANESP) injection - DIALYSIS  100 mcg Intravenous Q Wed-HD  . ferric gluconate (FERRLECIT/NULECIT) IV  125 mg Intravenous Q M,W,F-HD  . insulin aspart  0-20 Units Subcutaneous 6 times per day  . insulin aspart  0-5 Units Subcutaneous QHS  . insulin detemir  20 Units Subcutaneous QPM  . metoprolol  2.5 mg Intravenous 4 times per day  . midodrine  5 mg Oral BID WC  . multivitamin  1 tablet Oral QHS  . piperacillin-tazobactam (ZOSYN)  IV  2.25 g Intravenous Q8H  . sevelamer carbonate  2,400 mg Oral TID WC  . silver sulfADIAZINE   Topical Daily  . vancomycin  1,000 mg Intravenous Q M,W,F-HD   Continuous Infusions:  PRN Meds:.morphine injection, polyvinyl alcohol  Antibiotics    Anti-infectives    Start     Dose/Rate Route Frequency Ordered Stop   04/10/14 1200  vancomycin (VANCOCIN) IVPB 1000 mg/200 mL premix     1,000 mg200 mL/hr over 60 Minutes Intravenous Every M-W-F (Hemodialysis) 04/07/2014 2240     04/10/14 0000  piperacillin-tazobactam (ZOSYN) IVPB 2.25 g     2.25 g100 mL/hr over 30 Minutes Intravenous Every 8 hours 04/14/2014 2240     04/17/2014 2245  vancomycin (VANCOCIN) IVPB 1000 mg/200 mL premix  Status:  Discontinued     1,000 mg200 mL/hr over 60 Minutes Intravenous  Once 04/12/2014 2240 04/10/14 1533   03/21/2014 1645  vancomycin (VANCOCIN) IVPB 1000 mg/200 mL premix      1,000 mg200 mL/hr over 60 Minutes Intravenous  Once 04/02/2014 1634 04/16/2014 1900   04/06/2014 1645  piperacillin-tazobactam (ZOSYN) IVPB 3.375 g     3.375 g100 mL/hr over 30 Minutes Intravenous  Once 03/31/2014 1634 03/30/2014 1725      Subjective:   Payton Moder seen and examined today.   Complains of abdominal pain, but only when her abdomen is touched.  Denies nausea, chest pain, SOB.  Patient states she is very sleepy.  Objective:   Filed Vitals:   04/12/14 0500 04/12/14 0541 04/12/14 0600 04/12/14 0700  BP: 83/57 88/49 94/51  90/49  Pulse: 108 104 107 105  Temp:    98.1 F (36.7 C)  TempSrc:    Oral  Resp: 10 12 10 10   Height:      Weight: 119.4 kg (263 lb 3.7 oz)     SpO2: 98% 100% 100% 99%    Wt Readings from Last 3 Encounters:  04/12/14 119.4 kg (263 lb 3.7 oz)  03/19/14 119.75 kg (264 lb)  03/19/14 119.795 kg (264 lb 1.6 oz)     Intake/Output Summary (Last 24 hours) at 04/12/14 1937 Last data filed  at 04/11/14 2324  Gross per 24 hour  Intake   1540 ml  Output      0 ml  Net   1540 ml    Exam  General: Well developed, well nourished, NAD, appears stated age  38: NCAT, mucous membranes moist.   Cardiovascular: S1 S2 auscultated, RRR  Respiratory: Clear to auscultation bilaterally with equal chest rise  Abdomen: Soft, obese, tender, nondistended, + bowel sounds, lesions and wounds noted to LLQ and RLQ with erythema, necrotic black eschar noted on LLQ  Extremities: warm dry without cyanosis clubbing. LE edema and erythema  Neuro: AAOx3, no focal deficits  Skin: multiple wounds noted on abdomin, erythema and wounds on LE B/L.  Data Review   Micro Results Recent Results (from the past 240 hour(s))  Blood Culture (routine x 2)     Status: None (Preliminary result)   Collection Time: 04/08/2014  3:50 PM  Result Value Ref Range Status   Specimen Description BLOOD ARM LEFT  Final   Special Requests BOTTLES DRAWN AEROBIC AND ANAEROBIC 5CC  Final   Culture   Setup Time   Final    04/16/2014 21:15 Performed at [facility]    Culture   Final           BLOOD CULTURE RECEIVED NO GROWTH TO DATE CULTURE WILL BE HELD FOR 5 DAYS BEFORE ISSUING A FINAL NEGATIVE REPORT Performed at [facility]    Report Status PENDING  Incomplete  Blood Culture (routine x 2)     Status: None (Preliminary result)   Collection Time: 04/07/2014  5:25 PM  Result Value Ref Range Status   Specimen Description BLOOD FOREARM RIGHT  Final   Special Requests BOTTLES DRAWN AEROBIC ONLY [place]  Final   Culture  Setup Time   Final    04/10/2014 22:36 Performed at [facility]    Culture   Final           BLOOD CULTURE RECEIVED NO GROWTH TO DATE CULTURE WILL BE HELD FOR 5 DAYS BEFORE ISSUING A FINAL NEGATIVE REPORT Performed at [facility]    Report Status PENDING  Incomplete  Urine culture     Status: None   Collection Time: 04/04/2014  6:20 PM  Result Value Ref Range Status   Specimen Description URINE, CATHETERIZED  Final   Special Requests NONE  Final   Culture  Setup Time   Final    04/10/2014 01:21 Performed at [place] Performed at [facility]   Final   Culture NO GROWTH Performed at [facility]   Final   Report Status 04/10/2014 FINAL  Final  MRSA PCR Screening     Status: None   Collection Time: 04/17/2014 11:30 PM  Result Value Ref Range Status   MRSA by PCR NEGATIVE NEGATIVE Final    Comment:        The GeneXpert MRSA Assay (FDA approved for NASAL specimens only), is one component of a comprehensive MRSA colonization surveillance program. It is not intended to diagnose MRSA infection nor to guide or monitor treatment for MRSA infections.     Radiology Reports Ct Abdomen Pelvis Wo Contrast  03/30/2014   CLINICAL DATA:  Fall 3 days ago.  Lethargy.  EXAM: CT ABDOMEN AND PELVIS WITHOUT CONTRAST  TECHNIQUE: Multidetector CT imaging of the abdomen and pelvis was  performed following the standard protocol without IV contrast.  COMPARISON:  02/15/2014  FINDINGS: Lack of oral and intravenous contrast limits the exam. Motion artifact limits the exam. Overall, this is a severely limited examination.  Stable left lower lobe pulmonary nodule.  Stable left pleural effusion with features of loculation.  Stable small pericardial effusion.  Gallstones have developed.  Liver, spleen, pancreas are grossly within normal limits.  There is nonspecific stranding involving the left adrenal gland. Right adrenal gland is stable with some calcifications.  Lymph stones image gastrohepatic ligament and about the aorta are stable compatible with mild adenopathy.  Stable appearance of the kidneys.  Left renal cyst.  Bladder is decompressed.  There is stranding and soft tissue density within the right inguinal region which is stable. Stranding throughout the subcutaneous fat and intraperitoneal fat is present compatible with diffuse anasarca.  Atherosclerotic calcifications are present and stable  Uterus is unremarkable.  IMPRESSION: Cholelithiasis is now evident.  Diffuse anasarca.  Other findings are stable compared to the prior study. This includes a left lower lobe pulmonary nodule as well as abdominal adenopathy. There is also stranding within the soft tissues of the right inguinal region. An inflammatory process is not excluded.   Electronically Signed   By: Maryclare Bean M.D.   On: 03/28/2014 20:34   Dg Chest 2 View  03/20/2014   CLINICAL DATA:  Productive cough and back pain for 2 weeks. Evaluate for pneumonia.  EXAM: CHEST  2 VIEW  COMPARISON:  12/28/2013; 10/06/2013  FINDINGS: Grossly unchanged enlarged cardiac silhouette and mediastinal contours. Stable positioning of support apparatus. Grossly unchanged bibasilar heterogeneous opacities, left greater than right, likely atelectasis. Pulmonary venous congestion without frank evidence of edema. Pleural parenchymal thickening about the  bilateral major and the right minor fissures. No pleural effusion or pneumothorax. Unchanged bones.  IMPRESSION: 1. No acute cardiopulmonary disease. Specifically, no evidence of pneumonia. 2. Similar findings of cardiomegaly, pulmonary venous congestion and bibasilar atelectasis.   Electronically Signed   By: Sandi Mariscal M.D.   On: 03/20/2014 08:18   Ct Head Wo Contrast  04/16/2014   CLINICAL DATA:  Weakness  EXAM: CT HEAD WITHOUT CONTRAST  TECHNIQUE: Contiguous axial images were obtained from the base of the skull through the vertex without intravenous contrast.  COMPARISON:  07/28/2011  FINDINGS: No skull fracture is noted. Paranasal sinuses and mastoid air cells are unremarkable. Postsurgical high-density material noted right eye globe.  No intracranial hemorrhage, mass effect or midline shift. No acute cortical infarction. No mass lesion is noted on this unenhanced scan. The gray and white-matter differentiation is is preserved. Ventricular size is stable from prior exam.  IMPRESSION: No acute intracranial abnormality.  No significant change.   Electronically Signed   By: Lahoma Crocker M.D.   On: 03/29/2014 17:23   Dg Chest Port 1 View  (if Code Sepsis Called)  03/26/2014   CLINICAL DATA:  Lethargy, shortness of Breath  EXAM: PORTABLE CHEST - 1 VIEW  COMPARISON:  03/19/2014  FINDINGS: Cardiomegaly again noted. Dual lumen left IJ catheter is unchanged in position. Central mild vascular congestion without convincing pulmonary edema. Mild left basilar atelectasis. No segmental infiltrate.  IMPRESSION: Cardiomegaly .Mild left basilar atelectasis. No segmental infiltrate. Central mild vascular congestion without pulmonary edema.   Electronically Signed   By: Lahoma Crocker M.D.   On: 03/30/2014 16:22    CBC  Recent Labs Lab 04/17/2014 1550  03/24/2014 2234 04/10/14 0324 04/10/14 0710 04/11/14 0320 04/12/14 0325  WBC 11.9*  --  12.9* 11.2* 11.9* 11.6* 13.1*  HGB 9.5*  < >  9.0* 9.3* 9.0* 9.7* 10.5*  HCT  32.7*  < > 31.1* 31.8* 31.0* 33.0* 35.1*  PLT 449*  --  420* 431* 425* 397 378  MCV 94.8  --  93.4 93.0 92.8 91.9 90.7  MCH 27.5  --  27.0 27.2 26.9 27.0 27.1  MCHC 29.1*  --  28.9* 29.2* 29.0* 29.4* 29.9*  RDW 19.3*  --  19.2* 19.3* 19.5* 19.5* 19.7*  LYMPHSABS 0.6*  --   --  0.8  --   --   --   MONOABS 0.5  --   --  0.5  --   --   --   EOSABS 0.0  --   --  0.0  --   --   --   BASOSABS 0.0  --   --  0.0  --   --   --   < > = values in this interval not displayed.  Chemistries   Recent Labs Lab 03/31/2014 1550 03/30/2014 1806 04/18/2014 1850 04/10/2014 2234 04/10/14 0324 04/10/14 0710 04/11/14 0320 04/12/14 0325  NA 134* 133*  --   --  137 135 140 139  K 6.3* 6.0*  --   --  5.8* 5.3* 4.2 4.6  CL 99 103  --   --  100 97 103 100  CO2 14*  --   --   --  16* 18* 22 22  GLUCOSE 263* 248* 283*  --  275* 232* 114* 82  BUN 77* 74*  --   --  83* 84* 56* 70*  CREATININE 8.01* 7.40*  --  7.86* 8.01* 7.92* 6.04* 6.96*  CALCIUM 7.9*  --   --   --  7.6* 7.1* 7.3* 7.0*  AST 904*  --   --   --  2368*  --  1072*  --   ALT 339*  --   --   --  929*  --  817*  --   ALKPHOS 207*  --   --   --  180*  --  155*  --   BILITOT 1.4*  --   --   --  1.2  --  1.2  --    ------------------------------------------------------------------------------------------------------------------ estimated creatinine clearance is 13.6 mL/min (by C-G formula based on Cr of 6.96). ------------------------------------------------------------------------------------------------------------------  Recent Labs  04/05/2014 2234  HGBA1C 7.7*   ------------------------------------------------------------------------------------------------------------------ No results for input(s): CHOL, HDL, LDLCALC, TRIG, CHOLHDL, LDLDIRECT in the last 72 hours. ------------------------------------------------------------------------------------------------------------------ No results for input(s): TSH, T4TOTAL, T3FREE, THYROIDAB in the last  72 hours.  Invalid input(s): FREET3 ------------------------------------------------------------------------------------------------------------------ No results for input(s): VITAMINB12, FOLATE, FERRITIN, TIBC, IRON, RETICCTPCT in the last 72 hours.  Coagulation profile  Recent Labs Lab 04/10/14 0350 04/11/14 0320 04/12/14 0325  INR 2.13* 2.54* 2.44*    No results for input(s): DDIMER in the last 72 hours.  Cardiac Enzymes  Recent Labs Lab 04/18/2014 1850 03/24/2014 2234 04/10/14 0350  TROPONINI 1.19* 1.42* 1.30*   ------------------------------------------------------------------------------------------------------------------ Invalid input(s): POCBNP    Kourtnei Rauber D.O. on 04/12/2014 at 9:34 AM  Between 7am to 7pm - Pager - [phone]  After 7pm go to [email or web address] - password [email or web address]  And look for the night coverage person covering for me after hours  [facility] Office  [phone]

## 2014-04-12 NOTE — Progress Notes (Signed)
[facility] ROUNDING NOTE   Subjective:   Interval History:  Resting comfortably today no distress  Objective:  Vital signs in last 24 hours:  Temp:  [97.8 F (36.6 C)-99.3 F (37.4 C)] 97.8 F (36.6 C) (12/25 1130) Pulse Rate:  [98-112] 107 (12/25 1130) Resp:  [7-30] 15 (12/25 1130) BP: (83-108)/(46-60) 97/50 mmHg (12/25 1130) SpO2:  [91 %-100 %] 96 % (12/25 1130) Weight:  [119.4 kg (263 lb 3.7 oz)] 119.4 kg (263 lb 3.7 oz) (12/25 0500)  Weight change: -0.6 kg (-1 lb 5.2 oz) Filed Weights   04/10/14 1049 04/11/14 0338 04/12/14 0500  Weight: 120 kg (264 lb 8.8 oz) 118.3 kg (260 lb 12.9 oz) 119.4 kg (263 lb 3.7 oz)    Intake/Output: I/O last 3 completed shifts: In: 2000 [P.O.:1800; IV Piggyback:200] Out: -    Intake/Output this shift:  Total I/O In: 370 [P.O.:320; IV Piggyback:50] Out: -   CVS- RRR RS- CTA ABD- BS present soft non-distended EXT- multiple wounds noted on abdomin, erythema and wounds  No edema   Basic Metabolic Panel:  Recent Labs Lab 03/20/2014 1550 04/14/2014 1806 03/19/2014 1850 04/03/2014 2234 04/10/14 0324 04/10/14 0710 04/11/14 0320 04/12/14 0325  NA 134* 133*  --   --  137 135 140 139  K 6.3* 6.0*  --   --  5.8* 5.3* 4.2 4.6  CL 99 103  --   --  100 97 103 100  CO2 14*  --   --   --  16* 18* 22 22  GLUCOSE 263* 248* 283*  --  275* 232* 114* 82  BUN 77* 74*  --   --  83* 84* 56* 70*  CREATININE 8.01* 7.40*  --  7.86* 8.01* 7.92* 6.04* 6.96*  CALCIUM 7.9*  --   --   --  7.6* 7.1* 7.3* 7.0*  PHOS  --   --   --   --   --  10.9*  --   --     Liver Function Tests:  Recent Labs Lab 04/04/2014 1550 04/10/14 0324 04/10/14 0710 04/11/14 0320  AST 904* 2368*  --  1072*  ALT 339* 929*  --  817*  ALKPHOS 207* 180*  --  155*  BILITOT 1.4* 1.2  --  1.2  PROT 6.0 6.0  --  5.0*  ALBUMIN 1.9* 1.7* 1.7* 1.4*    Recent Labs Lab 03/24/2014 1850  LIPASE 20    Recent Labs Lab 04/14/2014 1930 04/11/14 0320  AMMONIA 74* 41*     CBC:  Recent Labs Lab 04/16/2014 1550  03/27/2014 2234 04/10/14 0324 04/10/14 0710 04/11/14 0320 04/12/14 0325  WBC 11.9*  --  12.9* 11.2* 11.9* 11.6* 13.1*  NEUTROABS 10.8*  --   --  9.8*  --   --   --   HGB 9.5*  < > 9.0* 9.3* 9.0* 9.7* 10.5*  HCT 32.7*  < > 31.1* 31.8* 31.0* 33.0* 35.1*  MCV 94.8  --  93.4 93.0 92.8 91.9 90.7  PLT 449*  --  420* 431* 425* 397 378  < > = values in this interval not displayed.  Cardiac Enzymes:  Recent Labs Lab 03/27/2014 1850 04/05/2014 2234 04/10/14 0350  TROPONINI 1.19* 1.42* 1.30*    BNP: Invalid input(s): POCBNP  CBG:  Recent Labs Lab 04/11/14 1704 04/11/14 1931 04/11/14 2314 04/12/14 0324 04/12/14 0751  GLUCAP 137* 149* 103* 52 95    Microbiology: Results for orders placed or performed during the hospital encounter of  04/14/2014  Blood Culture (routine x 2)     Status: None (Preliminary result)   Collection Time: 03/29/2014  3:50 PM  Result Value Ref Range Status   Specimen Description BLOOD ARM LEFT  Final   Special Requests BOTTLES DRAWN AEROBIC AND ANAEROBIC 5CC  Final   Culture  Setup Time   Final    04/12/2014 21:15 Performed at [facility]    Culture   Final           BLOOD CULTURE RECEIVED NO GROWTH TO DATE CULTURE WILL BE HELD FOR 5 DAYS BEFORE ISSUING A FINAL NEGATIVE REPORT Performed at [facility]    Report Status PENDING  Incomplete  Blood Culture (routine x 2)     Status: None (Preliminary result)   Collection Time: 04/09/2014  5:25 PM  Result Value Ref Range Status   Specimen Description BLOOD FOREARM RIGHT  Final   Special Requests BOTTLES DRAWN AEROBIC ONLY [place]  Final   Culture  Setup Time   Final    04/10/2014 22:36 Performed at [facility]    Culture   Final           BLOOD CULTURE RECEIVED NO GROWTH TO DATE CULTURE WILL BE HELD FOR 5 DAYS BEFORE ISSUING A FINAL NEGATIVE REPORT Performed at [facility]    Report Status PENDING  Incomplete  Urine culture      Status: None   Collection Time: 04/08/2014  6:20 PM  Result Value Ref Range Status   Specimen Description URINE, CATHETERIZED  Final   Special Requests NONE  Final   Culture  Setup Time   Final    04/10/2014 01:21 Performed at [place] Performed at [facility]   Final   Culture NO GROWTH Performed at [facility]   Final   Report Status 04/10/2014 FINAL  Final  MRSA PCR Screening     Status: None   Collection Time: 04/13/2014 11:30 PM  Result Value Ref Range Status   MRSA by PCR NEGATIVE NEGATIVE Final    Comment:        The GeneXpert MRSA Assay (FDA approved for NASAL specimens only), is one component of a comprehensive MRSA colonization surveillance program. It is not intended to diagnose MRSA infection nor to guide or monitor treatment for MRSA infections.     Coagulation Studies:  Recent Labs  04/10/14 0350 04/11/14 0320 04/12/14 0325  LABPROT 24.0* 27.6* 26.7*  INR 2.13* 2.54* 2.44*    Urinalysis:  Recent Labs  04/02/2014 1820  COLORURINE RED*  LABSPEC 1.031*  PHURINE 5.0  GLUCOSEU 250*  HGBUR MODERATE*  BILIRUBINUR MODERATE*  KETONESUR 15*  PROTEINUR >300*  UROBILINOGEN 1.0  NITRITE POSITIVE*  LEUKOCYTESUR SMALL*      Imaging: No results found.   Medications:     . cinacalcet  30 mg Oral Q supper  . darbepoetin (ARANESP) injection - DIALYSIS  100 mcg Intravenous Q Wed-HD  . ferric gluconate (FERRLECIT/NULECIT) IV  125 mg Intravenous Q M,W,F-HD  . insulin aspart  0-20 Units Subcutaneous 6 times per day  . insulin aspart  0-5 Units Subcutaneous QHS  . insulin detemir  20 Units Subcutaneous QPM  . metoprolol  2.5 mg Intravenous 4 times per day  . midodrine  5 mg Oral BID WC  . multivitamin  1 tablet Oral QHS  . piperacillin-tazobactam (ZOSYN)  IV  2.25 g Intravenous Q8H  . sevelamer carbonate  2,400 mg Oral TID WC  . silver sulfADIAZINE   Topical Daily  . vancomycin  1,000 mg  Intravenous Q M,W,F-HD   morphine injection, polyvinyl alcohol  Assessment/ Plan:   ESRD-  Last dialysis Wednesday 12/23  Plan dialysis Saturday   ANEMIA- Hb 10.5   MBD- calcium low   ? Calciphylaxis although it was negative per biopsy x2  HTN/VOL- chronic low BP  EF 25 %  Midodrine helping  ACCESS- IJ catheter   ID: multiple potential sourses of sepsis being covered with broad spectrum antibiotics   LOS: 3 Azhane Eckart,Kyne W @TODAY @12 :09 PM

## 2014-04-12 NOTE — Progress Notes (Addendum)
ANTIBIOTIC CONSULT NOTE - FOLLOW UP  Pharmacy Consult for vancomycin/zosyn Indication: r/o sepsis  Allergies  Allergen Reactions  . Crestor [Rosuvastatin] Other (See Comments)    Severe muscle weakness  . Nsaids Other (See Comments)    Not allergic, "bad on my kidneys"  . Ciprofloxacin Rash    Patient Measurements: Height: 5\' 7"  (170.2 cm) Weight: 263 lb 3.7 oz (119.4 kg) IBW/kg (Calculated) : 61.6   Vital Signs: Temp: 98.1 F (36.7 C) (12/25 0700) Temp Source: Oral (12/25 0700) BP: 101/59 mmHg (12/25 0900) Pulse Rate: 108 (12/25 0900) Intake/Output from previous day: 12/24 0701 - 12/25 0700 In: 1950 [P.O.:1800; IV Piggyback:150] Out: -  Intake/Output from this shift: Total I/O In: 370 [P.O.:320; IV Piggyback:50] Out: -   Labs:  Recent Labs  04/10/14 0710 04/11/14 0320 04/12/14 0325  WBC 11.9* 11.6* 13.1*  HGB 9.0* 9.7* 10.5*  PLT 425* 397 378  CREATININE 7.92* 6.04* 6.96*   Estimated Creatinine Clearance: 13.6 mL/min (by C-G formula based on Cr of 6.96). No results for input(s): VANCOTROUGH, VANCOPEAK, VANCORANDOM, GENTTROUGH, GENTPEAK, GENTRANDOM, TOBRATROUGH, TOBRAPEAK, TOBRARND, AMIKACINPEAK, AMIKACINTROU, AMIKACIN in the last 72 hours.   Microbiology: Recent Results (from the past 720 hour(s))  Blood Culture (routine x 2)     Status: None (Preliminary result)   Collection Time: 03/28/2014  3:50 PM  Result Value Ref Range Status   Specimen Description BLOOD ARM LEFT  Final   Special Requests BOTTLES DRAWN AEROBIC AND ANAEROBIC 5CC  Final   Culture  Setup Time   Final    04/05/2014 21:15 Performed at [facility]    Culture   Final           BLOOD CULTURE RECEIVED NO GROWTH TO DATE CULTURE WILL BE HELD FOR 5 DAYS BEFORE ISSUING A FINAL NEGATIVE REPORT Performed at [facility]    Report Status PENDING  Incomplete  Blood Culture (routine x 2)     Status: None (Preliminary result)   Collection Time: 03/28/2014  5:25 PM  Result Value Ref  Range Status   Specimen Description BLOOD FOREARM RIGHT  Final   Special Requests BOTTLES DRAWN AEROBIC ONLY [place]  Final   Culture  Setup Time   Final    04/01/2014 22:36 Performed at [facility]    Culture   Final           BLOOD CULTURE RECEIVED NO GROWTH TO DATE CULTURE WILL BE HELD FOR 5 DAYS BEFORE ISSUING A FINAL NEGATIVE REPORT Performed at [facility]    Report Status PENDING  Incomplete  Urine culture     Status: None   Collection Time: 04/02/2014  6:20 PM  Result Value Ref Range Status   Specimen Description URINE, CATHETERIZED  Final   Special Requests NONE  Final   Culture  Setup Time   Final    04/10/2014 01:21 Performed at [place] Performed at [facility]   Final   Culture NO GROWTH Performed at [facility]   Final   Report Status 04/10/2014 FINAL  Final  MRSA PCR Screening     Status: None   Collection Time: 03/29/2014 11:30 PM  Result Value Ref Range Status   MRSA by PCR NEGATIVE NEGATIVE Final    Comment:        The GeneXpert MRSA Assay (FDA approved for NASAL specimens only), is one component of a comprehensive MRSA colonization surveillance program. It is  not intended to diagnose MRSA infection nor to guide or monitor treatment for MRSA infections.     Anti-infectives    Start     Dose/Rate Route Frequency Ordered Stop   04/10/14 1200  vancomycin (VANCOCIN) IVPB 1000 mg/200 mL premix     1,000 mg200 mL/hr over 60 Minutes Intravenous Every M-W-F (Hemodialysis) 04/08/2014 2240     04/10/14 0000  piperacillin-tazobactam (ZOSYN) IVPB 2.25 g     2.25 g100 mL/hr over 30 Minutes Intravenous Every 8 hours 04/11/2014 2240     03/31/2014 2245  vancomycin (VANCOCIN) IVPB 1000 mg/200 mL premix  Status:  Discontinued     1,000 mg200 mL/hr over 60 Minutes Intravenous  Once 03/23/2014 2240 04/10/14 1533   03/22/2014 1645  vancomycin (VANCOCIN) IVPB 1000 mg/200 mL premix     1,000 mg200 mL/hr over 60  Minutes Intravenous  Once 03/23/2014 1634 04/11/2014 1900   03/20/2014 1645  piperacillin-tazobactam (ZOSYN) IVPB 3.375 g     3.375 g100 mL/hr over 30 Minutes Intravenous  Once 04/14/2014 1634 04/12/2014 1725      Assessment: 43yo female with ESRD on HD MWF presents with fatigue. Pharmacy was consulted to dose vancomycin and zosyn for sepsis (due to UTI or cellulitis).  WBC= 13.1, afeb, and noted with ESRD on HD with land HD on 04/10/14.  vanc 12/22> Zosyn 12/22>>  12/22 blood x 2 - ngtd 12/22 urine - neg 12/22 MRSA screen neg  Goal of Therapy:  Vancomycin pre-HD target level 15-25 mcg/ml  Plan:  -Vancomycin 1000mg  IV x1 with HD on 12/26 -Further vancomycin doses based on HD orders -Will consider a vancomycin level when at steady state -Will follow HD schedule, cultures and clinical progress  Hildred Laser, Pharm D 04/12/2014 11:19 AM    ADDN: Pt is set to start HD on Mon, 12/28, with MWF schedule. Will schedule vancomycin 1000mg  qHD.  Andrey Cota. Diona Foley, PharmD Clinical Pharmacist Pager [phone]

## 2014-04-12 NOTE — Progress Notes (Signed)
Heart rates on 120's sinus tach. Md aware no order. Continue to monitor.

## 2014-04-13 LAB — CBC
HCT: 35 % — ABNORMAL LOW (ref 36.0–46.0)
Hemoglobin: 10.7 g/dL — ABNORMAL LOW (ref 12.0–15.0)
MCH: 27.5 pg (ref 26.0–34.0)
MCHC: 30.6 g/dL (ref 30.0–36.0)
MCV: 90 fL (ref 78.0–100.0)
PLATELETS: 383 10*3/uL (ref 150–400)
RBC: 3.89 MIL/uL (ref 3.87–5.11)
RDW: 20 % — AB (ref 11.5–15.5)
WBC: 15.3 10*3/uL — ABNORMAL HIGH (ref 4.0–10.5)

## 2014-04-13 LAB — COMPREHENSIVE METABOLIC PANEL
ALT: 500 U/L — ABNORMAL HIGH (ref 0–35)
ANION GAP: 19 — AB (ref 5–15)
AST: 199 U/L — ABNORMAL HIGH (ref 0–37)
Albumin: 1.3 g/dL — ABNORMAL LOW (ref 3.5–5.2)
Alkaline Phosphatase: 137 U/L — ABNORMAL HIGH (ref 39–117)
BILIRUBIN TOTAL: 3.6 mg/dL — AB (ref 0.3–1.2)
BUN: 82 mg/dL — AB (ref 6–23)
CO2: 18 mmol/L — AB (ref 19–32)
CREATININE: 7.85 mg/dL — AB (ref 0.50–1.10)
Calcium: 6.6 mg/dL — ABNORMAL LOW (ref 8.4–10.5)
Chloride: 97 mEq/L (ref 96–112)
GFR, EST AFRICAN AMERICAN: 6 mL/min — AB (ref >90)
GFR, EST NON AFRICAN AMERICAN: 6 mL/min — AB (ref >90)
Glucose, Bld: 86 mg/dL (ref 70–99)
Potassium: 5.1 mmol/L (ref 3.5–5.1)
Sodium: 134 mmol/L — ABNORMAL LOW (ref 135–145)
Total Protein: 5.5 g/dL — ABNORMAL LOW (ref 6.0–8.3)

## 2014-04-13 LAB — GLUCOSE, CAPILLARY
GLUCOSE-CAPILLARY: 98 mg/dL (ref 70–99)
GLUCOSE-CAPILLARY: 65 mg/dL — AB (ref 70–99)
GLUCOSE-CAPILLARY: 215 mg/dL — AB (ref 70–99)
GLUCOSE-CAPILLARY: 196 mg/dL — AB (ref 70–99)
Glucose-Capillary: 149 mg/dL — ABNORMAL HIGH (ref 70–99)
Glucose-Capillary: 184 mg/dL — ABNORMAL HIGH (ref 70–99)
Glucose-Capillary: 81 mg/dL (ref 70–99)

## 2014-04-13 LAB — PHOSPHORUS: PHOSPHORUS: 9 mg/dL — AB (ref 2.3–4.6)

## 2014-04-13 LAB — AMMONIA: AMMONIA: 28 umol/L (ref 11–32)

## 2014-04-13 MED ORDER — SODIUM CHLORIDE 0.9 % IV SOLN: 100.0000 mL | INTRAVENOUS | Status: DC | PRN

## 2014-04-13 MED ORDER — OXYCODONE HCL ER 15 MG PO T12A
30.0000 mg | EXTENDED_RELEASE_TABLET | Freq: Two times a day (BID) | ORAL | Status: DC
  Administered 2014-04-13 – 2014-04-14 (×3): 30 mg via ORAL
  Filled 2014-04-13 (×3): qty 2

## 2014-04-13 MED ORDER — NEPRO/CARBSTEADY PO LIQD
237.0000 mL | ORAL | Status: DC | PRN
  Filled 2014-04-13: qty 237

## 2014-04-13 MED ORDER — PENTAFLUOROPROP-TETRAFLUOROETH EX AERO: 1.0000 "application " | INHALATION_SPRAY | CUTANEOUS | Status: DC | PRN

## 2014-04-13 MED ORDER — HYDROMORPHONE HCL 2 MG PO TABS
2.0000 mg | ORAL_TABLET | Freq: Four times a day (QID) | ORAL | Status: DC | PRN
  Administered 2014-04-13 – 2014-04-14 (×2): 4 mg via ORAL
  Administered 2014-04-15: 2 mg via ORAL
  Filled 2014-04-13: qty 1
  Filled 2014-04-13: qty 2
  Filled 2014-04-13 (×2): qty 1

## 2014-04-13 MED ORDER — OXYCODONE HCL ER 30 MG PO T12A: 1.0000 | EXTENDED_RELEASE_TABLET | Freq: Two times a day (BID) | ORAL | Status: DC

## 2014-04-13 MED ORDER — MORPHINE SULFATE 2 MG/ML IJ SOLN
INTRAMUSCULAR | Status: AC
  Filled 2014-04-13: qty 1

## 2014-04-13 MED ORDER — LIDOCAINE-PRILOCAINE 2.5-2.5 % EX CREA
1.0000 "application " | TOPICAL_CREAM | CUTANEOUS | Status: DC | PRN
  Filled 2014-04-13: qty 5

## 2014-04-13 MED ORDER — LIDOCAINE HCL (PF) 1 % IJ SOLN: 5.0000 mL | INTRAMUSCULAR | Status: DC | PRN

## 2014-04-13 MED ORDER — PIPERACILLIN-TAZOBACTAM IN DEX 2-0.25 GM/50ML IV SOLN
2.2500 g | Freq: Three times a day (TID) | INTRAVENOUS | Status: DC
  Administered 2014-04-13 – 2014-04-14 (×5): 2.25 g via INTRAVENOUS
  Filled 2014-04-13 (×9): qty 50

## 2014-04-13 MED ORDER — NEPRO/CARBSTEADY PO LIQD
237.0000 mL | Freq: Two times a day (BID) | ORAL | Status: DC
  Administered 2014-04-13: 237 mL via ORAL
  Filled 2014-04-13 (×2): qty 237

## 2014-04-13 MED ORDER — INSULIN DETEMIR 100 UNIT/ML ~~LOC~~ SOLN
10.0000 [IU] | Freq: Every evening | SUBCUTANEOUS | Status: DC
  Administered 2014-04-13 – 2014-04-15 (×2): 10 [IU] via SUBCUTANEOUS
  Filled 2014-04-13 (×4): qty 0.1

## 2014-04-13 MED ORDER — ALTEPLASE 100 MG IV SOLR
2.0000 mg | Freq: Once | INTRAVENOUS | Status: AC
  Administered 2014-04-13: 2 mg
  Filled 2014-04-13: qty 2

## 2014-04-13 MED ORDER — MIDODRINE HCL 5 MG PO TABS
ORAL_TABLET | ORAL | Status: AC
  Filled 2014-04-13: qty 1

## 2014-04-13 MED ORDER — HEPARIN SODIUM (PORCINE) 1000 UNIT/ML DIALYSIS: 1000.0000 [IU] | INTRAMUSCULAR | Status: DC | PRN

## 2014-04-13 MED ORDER — INSULIN ASPART 100 UNIT/ML ~~LOC~~ SOLN
0.0000 [IU] | Freq: Three times a day (TID) | SUBCUTANEOUS | Status: DC
  Administered 2014-04-13: 3 [IU] via SUBCUTANEOUS
  Administered 2014-04-14 (×3): 1 [IU] via SUBCUTANEOUS

## 2014-04-13 MED ORDER — ALTEPLASE 2 MG IJ SOLR
2.0000 mg | Freq: Once | INTRAMUSCULAR | Status: AC | PRN
  Administered 2014-04-13: 2 mg
  Filled 2014-04-13: qty 2

## 2014-04-13 NOTE — Progress Notes (Signed)
Chart reviewed.    [facility]                                                                              Patient Demographics  Hannah [name], is a 43 y.o. female, DOB - 28-Jun-1970, IRJ:[id number]  Admit date - 03/21/2014   Admitting Physician Shanda Howells, MD  Outpatient Primary MD for the patient is Vic Blackbird, MD  LOS - 4   Chief Complaint  Patient presents with  . Fatigue      Admit HPI / Brief Narrative: 43 year old female witH history of ESRD on dialysis Monday Wednesday and Friday, insulin-dependent diabetes, calciphylaxis, morbid obesity, coronary artery disease status post MI, ischemic cardiomyopathy, and lung cancer status post lobectomy presenting with sepsis, encephalopathy, UTI, anasarca, elevated troponin, and signif transaminitis. Husband reported patient with generalized malaise and weakness over 4-5 days. Was unable to go to dialysis on Friday secondary to weakness. Also missed dialysis Monday. EMS was called to the home because of persistent lethargy.  In the ER temperature was 100, heart rate in the 90s to 120s, respirations in the 20s, blood pressure in the 90s to 140s. Satting 94% on room air. White blood cell count 11.9, hemoglobin 12.6, potassium 6.3. Creatinine 7.4. Bicarb 14. AST 904, ALT 340, ALP 207, t bili 1.4. Troponin 1.19. EKG sinus tachycardia. Urinalysis indicative of infection. Was given 1 L normal saline bolus in the ER. Also given Kayexalate oral. Chest x-ray noted cardiomegaly and left basilar atelectasis. Pulmonary vascular congestion. Head CT within normal limits. CT of abdomen and pelvis noted anasarca, cholelithiasis, abdominal adenopathy, and left lower lobe pulmonary nodule.  Assessment & Plan   Toxic metabolic encephalopathy -Likely multifactorial including sepsis, UTI, uremia. Back to baseline  Sepsis secondary UTI vs ?cellulitis abdominal wall/legs Continue empiric vancomycin and zosyn, as she is improving on this regimen.   Urine cx negative. BC neg to date  Chronic abdominal wounds: calciphylaxis? Resume home pain medication regimen  Chronic hypotension Continue midodrine  Elevated troponin Likely from sepsis, ESRD. Cardiology has signed off. No further workup. ASA and plavix held due to coagulopathy  Transaminitis/coagulopathy/hepatic encephalopathy/shock liver LFTs, ammonia improving. Still coagulopathic. GI signed off.  Pericardial friction rub -Echocardiogram 12/23: EF of 25%  End-stage renal disease requiring hemodialysis Did not complete HD today due to access problems.  Hyperkalemia -Resolved  Diabetes mellitus with retinopathy, nephropathy Low CBGs today. Decrease SSI and levemir  Morbid obesity -BMI 42 -Patient will need outpatient follow-up to discuss lifestyle modifications  GERD  Anemia of CKD  History of lung cancer status post left lower lobe lobectomy 2013  Advance to solids. Increase activity. PT eval transfer to floor  Code Status: Full  Family Communication: Husband at bedside  Disposition Plan: Admitted  Time Spent in minutes   40 minutes  Procedures  Echocardiogram  Consults   Cardiology Gastroenterology Nephrology PCCM via phone  DVT Prophylaxis  SCDs  Lab Results  Component Value Date   PLT 383 04/13/2014    Medications  Scheduled Meds: . cinacalcet  30 mg Oral Q supper  . darbepoetin (ARANESP) injection - DIALYSIS  100 mcg Intravenous Q Wed-HD  . ferric gluconate (FERRLECIT/NULECIT) IV  125 mg Intravenous Q M,W,F-HD  . insulin aspart  0-20 Units Subcutaneous 6 times per day  . insulin detemir  10 Units Subcutaneous QPM  . midodrine  5 mg Oral TID  . multivitamin  1 tablet Oral QHS  . OxyCODONE HCl ER  1 tablet Oral BID  . piperacillin-tazobactam (ZOSYN)  IV  2.25 g Intravenous Q8H  . sevelamer carbonate  2,400 mg Oral TID WC  . silver sulfADIAZINE   Topical Daily   Continuous Infusions:  PRN Meds:.HYDROmorphone, morphine injection,  polyvinyl alcohol  Antibiotics    Anti-infectives    Start     Dose/Rate Route Frequency Ordered Stop   04/13/14 1300  piperacillin-tazobactam (ZOSYN) IVPB 2.25 g     2.25 g100 mL/hr over 30 Minutes Intravenous Every 8 hours 04/13/14 1237     04/13/14 1200  vancomycin (VANCOCIN) IVPB 1000 mg/200 mL premix     1,000 mg200 mL/hr over 60 Minutes Intravenous Every Sat (Hemodialysis) 04/12/14 1355 04/13/14 1340   04/10/14 1200  vancomycin (VANCOCIN) IVPB 1000 mg/200 mL premix  Status:  Discontinued     1,000 mg200 mL/hr over 60 Minutes Intravenous Every M-W-F (Hemodialysis) 04/12/2014 2240 04/12/14 1355   04/10/14 0000  piperacillin-tazobactam (ZOSYN) IVPB 2.25 g  Status:  Discontinued     2.25 g100 mL/hr over 30 Minutes Intravenous Every 8 hours 04/12/2014 2240 04/13/14 1237   04/13/2014 2245  vancomycin (VANCOCIN) IVPB 1000 mg/200 mL premix  Status:  Discontinued     1,000 mg200 mL/hr over 60 Minutes Intravenous  Once 03/27/2014 2240 04/10/14 1533   04/05/2014 1645  vancomycin (VANCOCIN) IVPB 1000 mg/200 mL premix     1,000 mg200 mL/hr over 60 Minutes Intravenous  Once 04/03/2014 1634 04/08/2014 1900   03/24/2014 1645  piperacillin-tazobactam (ZOSYN) IVPB 3.375 g     3.375 g100 mL/hr over 30 Minutes Intravenous  Once 03/20/2014 1634 03/27/2014 1725      Subjective:   Hannah [name] seen and examined today.   Complains of abdominal pain, but only when her abdomen is touched.  Denies nausea, chest pain, SOB.  Patient states she is very sleepy.  Objective:   Filed Vitals:   04/13/14 0900 04/13/14 0930 04/13/14 1100 04/13/14 1130  BP: 93/67 102/61 109/63 97/65  Pulse: 111 107 108 108  Temp:    97 F (36.1 C)  TempSrc:    Oral  Resp: 11 10 9 9   Height:      Weight:    121.6 kg (268 lb 1.3 oz)  SpO2:    97%    Wt Readings from Last 3 Encounters:  04/13/14 121.6 kg (268 lb 1.3 oz)  03/19/14 119.75 kg (264 lb)  03/19/14 119.795 kg (264 lb 1.6 oz)     Intake/Output Summary (Last 24 hours) at 04/13/14  1340 Last data filed at 04/13/14 1250  Gross per 24 hour  Intake    610 ml  Output    480 ml  Net    130 ml    Exam  General: obese, a and o. Weak appearing  Cardiovascular: S1 S2 auscultated, RRR  Respiratory: Clear to auscultation bilaterally without WRR  Abdomen: Soft, obese, tender, nondistended, + bowel sounds, lesions and wounds noted to LLQ and RLQ with erythema, necrotic black eschar noted on LLQ  Extremities: warm dry without cyanosis clubbing. LE edema and erythema  Neuro: AAOx3, no focal deficits   Data Review   Micro Results Recent Results (from the past 240 hour(s))  Blood Culture (routine  x 2)     Status: None (Preliminary result)   Collection Time: 03/21/2014  3:50 PM  Result Value Ref Range Status   Specimen Description BLOOD ARM LEFT  Final   Special Requests BOTTLES DRAWN AEROBIC AND ANAEROBIC 5CC  Final   Culture  Setup Time   Final    03/19/2014 21:15 Performed at [facility]    Culture   Final           BLOOD CULTURE RECEIVED NO GROWTH TO DATE CULTURE WILL BE HELD FOR 5 DAYS BEFORE ISSUING A FINAL NEGATIVE REPORT Performed at [facility]    Report Status PENDING  Incomplete  Blood Culture (routine x 2)     Status: None (Preliminary result)   Collection Time: 04/16/2014  5:25 PM  Result Value Ref Range Status   Specimen Description BLOOD FOREARM RIGHT  Final   Special Requests BOTTLES DRAWN AEROBIC ONLY [place]  Final   Culture  Setup Time   Final    04/08/2014 22:36 Performed at [facility]    Culture   Final           BLOOD CULTURE RECEIVED NO GROWTH TO DATE CULTURE WILL BE HELD FOR 5 DAYS BEFORE ISSUING A FINAL NEGATIVE REPORT Performed at [facility]    Report Status PENDING  Incomplete  Urine culture     Status: None   Collection Time: 03/26/2014  6:20 PM  Result Value Ref Range Status   Specimen Description URINE, CATHETERIZED  Final   Special Requests NONE  Final   Culture  Setup Time   Final     04/10/2014 01:21 Performed at [place] Performed at [facility]   Final   Culture NO GROWTH Performed at [facility]   Final   Report Status 04/10/2014 FINAL  Final  MRSA PCR Screening     Status: None   Collection Time: 04/14/2014 11:30 PM  Result Value Ref Range Status   MRSA by PCR NEGATIVE NEGATIVE Final    Comment:        The GeneXpert MRSA Assay (FDA approved for NASAL specimens only), is one component of a comprehensive MRSA colonization surveillance program. It is not intended to diagnose MRSA infection nor to guide or monitor treatment for MRSA infections.     Radiology Reports Ct Abdomen Pelvis Wo Contrast  04/05/2014   CLINICAL DATA:  Fall 3 days ago.  Lethargy.  EXAM: CT ABDOMEN AND PELVIS WITHOUT CONTRAST  TECHNIQUE: Multidetector CT imaging of the abdomen and pelvis was performed following the standard protocol without IV contrast.  COMPARISON:  02/15/2014  FINDINGS: Lack of oral and intravenous contrast limits the exam. Motion artifact limits the exam. Overall, this is a severely limited examination.  Stable left lower lobe pulmonary nodule.  Stable left pleural effusion with features of loculation.  Stable small pericardial effusion.  Gallstones have developed.  Liver, spleen, pancreas are grossly within normal limits.  There is nonspecific stranding involving the left adrenal gland. Right adrenal gland is stable with some calcifications.  Lymph stones image gastrohepatic ligament and about the aorta are stable compatible with mild adenopathy.  Stable appearance of the kidneys.  Left renal cyst.  Bladder is decompressed.  There is stranding and soft tissue density within the right inguinal region which is stable. Stranding throughout the subcutaneous fat and intraperitoneal fat is present compatible with diffuse anasarca.  Atherosclerotic calcifications are present and  stable  Uterus is unremarkable.  IMPRESSION:  Cholelithiasis is now evident.  Diffuse anasarca.  Other findings are stable compared to the prior study. This includes a left lower lobe pulmonary nodule as well as abdominal adenopathy. There is also stranding within the soft tissues of the right inguinal region. An inflammatory process is not excluded.   Electronically Signed   By: Maryclare Bean M.D.   On: 04/13/2014 20:34   Dg Chest 2 View  03/20/2014   CLINICAL DATA:  Productive cough and back pain for 2 weeks. Evaluate for pneumonia.  EXAM: CHEST  2 VIEW  COMPARISON:  12/28/2013; 10/06/2013  FINDINGS: Grossly unchanged enlarged cardiac silhouette and mediastinal contours. Stable positioning of support apparatus. Grossly unchanged bibasilar heterogeneous opacities, left greater than right, likely atelectasis. Pulmonary venous congestion without frank evidence of edema. Pleural parenchymal thickening about the bilateral major and the right minor fissures. No pleural effusion or pneumothorax. Unchanged bones.  IMPRESSION: 1. No acute cardiopulmonary disease. Specifically, no evidence of pneumonia. 2. Similar findings of cardiomegaly, pulmonary venous congestion and bibasilar atelectasis.   Electronically Signed   By: Sandi Mariscal M.D.   On: 03/20/2014 08:18   Ct Head Wo Contrast  03/21/2014   CLINICAL DATA:  Weakness  EXAM: CT HEAD WITHOUT CONTRAST  TECHNIQUE: Contiguous axial images were obtained from the base of the skull through the vertex without intravenous contrast.  COMPARISON:  07/28/2011  FINDINGS: No skull fracture is noted. Paranasal sinuses and mastoid air cells are unremarkable. Postsurgical high-density material noted right eye globe.  No intracranial hemorrhage, mass effect or midline shift. No acute cortical infarction. No mass lesion is noted on this unenhanced scan. The gray and white-matter differentiation is is preserved. Ventricular size is stable from prior exam.  IMPRESSION: No acute intracranial abnormality.  No significant change.    Electronically Signed   By: Lahoma Crocker M.D.   On: 03/20/2014 17:23   Dg Chest Port 1 View  (if Code Sepsis Called)  04/17/2014   CLINICAL DATA:  Lethargy, shortness of Breath  EXAM: PORTABLE CHEST - 1 VIEW  COMPARISON:  03/19/2014  FINDINGS: Cardiomegaly again noted. Dual lumen left IJ catheter is unchanged in position. Central mild vascular congestion without convincing pulmonary edema. Mild left basilar atelectasis. No segmental infiltrate.  IMPRESSION: Cardiomegaly .Mild left basilar atelectasis. No segmental infiltrate. Central mild vascular congestion without pulmonary edema.   Electronically Signed   By: Lahoma Crocker M.D.   On: 04/13/2014 16:22    CBC  Recent Labs Lab 04/17/2014 1550  04/10/14 0324 04/10/14 0710 04/11/14 0320 04/12/14 0325 04/13/14 0505  WBC 11.9*  < > 11.2* 11.9* 11.6* 13.1* 15.3*  HGB 9.5*  < > 9.3* 9.0* 9.7* 10.5* 10.7*  HCT 32.7*  < > 31.8* 31.0* 33.0* 35.1* 35.0*  PLT 449*  < > 431* 425* 397 378 383  MCV 94.8  < > 93.0 92.8 91.9 90.7 90.0  MCH 27.5  < > 27.2 26.9 27.0 27.1 27.5  MCHC 29.1*  < > 29.2* 29.0* 29.4* 29.9* 30.6  RDW 19.3*  < > 19.3* 19.5* 19.5* 19.7* 20.0*  LYMPHSABS 0.6*  --  0.8  --   --   --   --   MONOABS 0.5  --  0.5  --   --   --   --   EOSABS 0.0  --  0.0  --   --   --   --   BASOSABS 0.0  --  0.0  --   --   --   --   < > =  values in this interval not displayed.  Chemistries   Recent Labs Lab 04/18/2014 1550  04/10/14 0324 04/10/14 0710 04/11/14 0320 04/12/14 0325 04/13/14 0505  NA 134*  < > 137 135 140 139 134*  K 6.3*  < > 5.8* 5.3* 4.2 4.6 5.1  CL 99  < > 100 97 103 100 97  CO2 14*  --  16* 18* 22 22 18*  GLUCOSE 263*  < > 275* 232* 114* 82 86  BUN 77*  < > 83* 84* 56* 70* 82*  CREATININE 8.01*  < > 8.01* 7.92* 6.04* 6.96* 7.85*  CALCIUM 7.9*  --  7.6* 7.1* 7.3* 7.0* 6.6*  AST 904*  --  2368*  --  1072*  --  199*  ALT 339*  --  929*  --  817*  --  500*  ALKPHOS 207*  --  180*  --  155*  --  137*  BILITOT 1.4*  --  1.2  --   1.2  --  3.6*  < > = values in this interval not displayed. ------------------------------------------------------------------------------------------------------------------ estimated creatinine clearance is 12.2 mL/min (by C-G formula based on Cr of 7.85). ------------------------------------------------------------------------------------------------------------------ No results for input(s): HGBA1C in the last 72 hours. ------------------------------------------------------------------------------------------------------------------ No results for input(s): CHOL, HDL, LDLCALC, TRIG, CHOLHDL, LDLDIRECT in the last 72 hours. ------------------------------------------------------------------------------------------------------------------ No results for input(s): TSH, T4TOTAL, T3FREE, THYROIDAB in the last 72 hours.  Invalid input(s): FREET3 ------------------------------------------------------------------------------------------------------------------ No results for input(s): VITAMINB12, FOLATE, FERRITIN, TIBC, IRON, RETICCTPCT in the last 72 hours.  Coagulation profile  Recent Labs Lab 04/10/14 0350 04/11/14 0320 04/12/14 0325  INR 2.13* 2.54* 2.44*    No results for input(s): DDIMER in the last 72 hours.  Cardiac Enzymes  Recent Labs Lab 04/07/2014 1850 04/05/2014 2234 04/10/14 0350  TROPONINI 1.19* 1.42* 1.30*   ------------------------------------------------------------------------------------------------------------------ Invalid input(s): POCBNP    Aujanae Mccullum L D.O. on 04/13/2014 at 1:40 PM  Text page [email or web address] - password [facility]  [facility]

## 2014-04-13 NOTE — Progress Notes (Signed)
Patient ID: Hannah [name], female   DOB: 1970-08-09, 43 y.o.   MRN: [id number]  [facility] Progress Note   Assessment/ Plan:   1. AMS from metabolic encephalopathy- no evident CVA and suspected to be from UTI (urine cultures negative) v/s cellulitis (no bacteremia) with sepsis---essentially sepsis of unclear source. On empiric vancomycin and zosyn.  2. ESRD ongoing HD at this time---HD catheter with poor flows, will try activase dwell. 3. 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 Nutrition: with protein/calorie malnutrition compounded by multiple medical problems and chronic TDC 6. Hypertension:On Midodrine for chronic hypotension  Subjective:   Complains of abdominal pain---left side more than right side (CT abdomen from 12/22 without clear source of her pain)   Objective:   BP 102/61 mmHg  Pulse 107  Temp(Src) 96.8 F (36 C) (Oral)  Resp 10  Ht 5\' 7"  (1.702 m)  Wt 122.1 kg (269 lb 2.9 oz)  BMI 42.15 kg/m2  SpO2 97%  LMP 09/17/2013 (Approximate)  Physical Exam: UXL:[id number] at HD ([facility]) OZD:GUYQI regular tachycardia, s1 and s2 with ESM Resp:CTA bilaterally, no rales HKV:QQVZ, obese, tender over upper quadrants DGL:OVFIE LE edema  Labs: BMET  Recent Labs Lab 04/01/2014 1550 03/27/2014 1806 03/29/2014 1850 03/27/2014 2234 04/10/14 0324 04/10/14 0710 04/11/14 0320 04/12/14 0325 04/13/14 0505  NA 134* 133*  --   --  137 135 140 139 134*  K 6.3* 6.0*  --   --  5.8* 5.3* 4.2 4.6 5.1  CL 99 103  --   --  100 97 103 100 97  CO2 14*  --   --   --  16* 18* 22 22 18*  GLUCOSE 263* 248* 283*  --  275* 232* 114* 82 86  BUN 77* 74*  --   --  83* 84* 56* 70* 82*  CREATININE 8.01* 7.40*  --  7.86* 8.01* 7.92* 6.04* 6.96* 7.85*  CALCIUM 7.9*  --   --   --  7.6* 7.1* 7.3* 7.0* 6.6*  PHOS  --   --   --   --   --  10.9*  --   --   --     CBC  Recent Labs Lab 03/19/2014 1550  04/10/14 0324 04/10/14 0710 04/11/14 0320 04/12/14 0325 04/13/14 0505  WBC 11.9*  < > 11.2* 11.9* 11.6* 13.1* 15.3*  NEUTROABS 10.8*  --  9.8*  --   --   --   --   HGB 9.5*  < > 9.3* 9.0* 9.7* 10.5* 10.7*  HCT 32.7*  < > 31.8* 31.0* 33.0* 35.1* 35.0*  MCV 94.8  < > 93.0 92.8 91.9 90.7 90.0  PLT 449*  < > 431* 425* 397 378 383  < > = values in this interval not displayed.  Medications:    . cinacalcet  30 mg Oral Q supper  . darbepoetin (ARANESP) injection - DIALYSIS  100 mcg Intravenous Q Wed-HD  . ferric gluconate (FERRLECIT/NULECIT) IV  125 mg Intravenous Q M,W,F-HD  . insulin aspart  0-20 Units Subcutaneous 6 times per day  . insulin aspart  0-5 Units Subcutaneous QHS  . insulin detemir  20 Units Subcutaneous QPM  . midodrine  5 mg Oral TID  . multivitamin  1 tablet Oral QHS  . piperacillin-tazobactam (ZOSYN)  IV  2.25 g Intravenous Q8H  . sevelamer carbonate  2,400 mg Oral  TID WC  . silver sulfADIAZINE   Topical Daily  . vancomycin  1,000 mg Intravenous Q Sat-HD   Elmarie Shiley, MD 04/13/2014, 10:14 AM

## 2014-04-14 DIAGNOSIS — M7989 Other specified soft tissue disorders: Secondary | ICD-10-CM

## 2014-04-14 DIAGNOSIS — I9589 Other hypotension: Secondary | ICD-10-CM | POA: Diagnosis present

## 2014-04-14 LAB — COMPREHENSIVE METABOLIC PANEL
ALT: 312 U/L — ABNORMAL HIGH (ref 0–35)
AST: 86 U/L — ABNORMAL HIGH (ref 0–37)
Albumin: 1.2 g/dL — ABNORMAL LOW (ref 3.5–5.2)
Alkaline Phosphatase: 135 U/L — ABNORMAL HIGH (ref 39–117)
Anion gap: 15 (ref 5–15)
BUN: 77 mg/dL — ABNORMAL HIGH (ref 6–23)
CALCIUM: 6.6 mg/dL — AB (ref 8.4–10.5)
CO2: 22 mmol/L (ref 19–32)
Chloride: 97 mEq/L (ref 96–112)
Creatinine, Ser: 7.41 mg/dL — ABNORMAL HIGH (ref 0.50–1.10)
GFR calc non Af Amer: 6 mL/min — ABNORMAL LOW (ref >90)
GFR, EST AFRICAN AMERICAN: 7 mL/min — AB (ref >90)
GLUCOSE: 160 mg/dL — AB (ref 70–99)
Potassium: 5 mmol/L (ref 3.5–5.1)
Sodium: 134 mmol/L — ABNORMAL LOW (ref 135–145)
Total Bilirubin: 3.3 mg/dL — ABNORMAL HIGH (ref 0.3–1.2)
Total Protein: 5.2 g/dL — ABNORMAL LOW (ref 6.0–8.3)

## 2014-04-14 LAB — CBC WITH DIFFERENTIAL/PLATELET
Basophils Absolute: 0 10*3/uL (ref 0.0–0.1)
Basophils Relative: 0 % (ref 0–1)
EOS ABS: 0.2 10*3/uL (ref 0.0–0.7)
Eosinophils Relative: 2 % (ref 0–5)
HCT: 33.6 % — ABNORMAL LOW (ref 36.0–46.0)
Hemoglobin: 10.4 g/dL — ABNORMAL LOW (ref 12.0–15.0)
LYMPHS ABS: 0.6 10*3/uL — AB (ref 0.7–4.0)
LYMPHS PCT: 4 % — AB (ref 12–46)
MCH: 28.2 pg (ref 26.0–34.0)
MCHC: 31 g/dL (ref 30.0–36.0)
MCV: 91.1 fL (ref 78.0–100.0)
Monocytes Absolute: 1.1 10*3/uL — ABNORMAL HIGH (ref 0.1–1.0)
Monocytes Relative: 7 % (ref 3–12)
NEUTROS PCT: 87 % — AB (ref 43–77)
Neutro Abs: 13.6 10*3/uL — ABNORMAL HIGH (ref 1.7–7.7)
Platelets: 345 10*3/uL (ref 150–400)
RBC: 3.69 MIL/uL — AB (ref 3.87–5.11)
RDW: 20.3 % — ABNORMAL HIGH (ref 11.5–15.5)
WBC: 15.5 10*3/uL — AB (ref 4.0–10.5)

## 2014-04-14 LAB — GLUCOSE, CAPILLARY
Glucose-Capillary: 137 mg/dL — ABNORMAL HIGH (ref 70–99)
Glucose-Capillary: 125 mg/dL — ABNORMAL HIGH (ref 70–99)
Glucose-Capillary: 140 mg/dL — ABNORMAL HIGH (ref 70–99)

## 2014-04-14 LAB — PROTIME-INR
INR: 2.7 — AB (ref 0.00–1.49)
PROTHROMBIN TIME: 28.9 s — AB (ref 11.6–15.2)

## 2014-04-14 MED ORDER — SODIUM CHLORIDE 0.9 % IV BOLUS (SEPSIS): 500.0000 mL | Freq: Once | INTRAVENOUS | Status: DC

## 2014-04-14 MED ORDER — SODIUM CHLORIDE 0.9 % IV BOLUS (SEPSIS)
250.0000 mL | Freq: Once | INTRAVENOUS | Status: AC
  Administered 2014-04-14: 250 mL via INTRAVENOUS

## 2014-04-14 MED ORDER — VANCOMYCIN HCL IN DEXTROSE 1-5 GM/200ML-% IV SOLN
1000.0000 mg | INTRAVENOUS | Status: DC
  Filled 2014-04-14: qty 200

## 2014-04-14 MED ORDER — SODIUM CHLORIDE 0.9 % IV BOLUS (SEPSIS)
500.0000 mL | Freq: Once | INTRAVENOUS | Status: AC
  Administered 2014-04-14: 500 mL via INTRAVENOUS

## 2014-04-14 MED ORDER — MIDODRINE HCL 5 MG PO TABS
10.0000 mg | ORAL_TABLET | Freq: Three times a day (TID) | ORAL | Status: DC
  Administered 2014-04-14 (×2): 10 mg via ORAL
  Filled 2014-04-14 (×6): qty 2

## 2014-04-14 NOTE — Progress Notes (Signed)
Chart reviewed.    [facility]                                                                              Patient Demographics  Hannah [name], is a 43 y.o. female, DOB - 1971/03/01, GGE:[id number]  Admit date - 04/05/2014   Admitting Physician Shanda Howells, MD  Outpatient Primary MD for the patient is Vic Blackbird, MD  LOS - 5   Chief Complaint  Patient presents with  . Fatigue      Admit HPI / Brief Narrative: 43 year old female witH history of ESRD on dialysis Monday Wednesday and Friday, insulin-dependent diabetes, calciphylaxis, morbid obesity, coronary artery disease status post MI, ischemic cardiomyopathy, and lung cancer status post lobectomy presenting with sepsis, encephalopathy, UTI, anasarca, elevated troponin, and signif transaminitis. Husband reported patient with generalized malaise and weakness over 4-5 days. Was unable to go to dialysis on Friday secondary to weakness. Also missed dialysis Monday. EMS was called to the home because of persistent lethargy.  In the ER temperature was 100, heart rate in the 90s to 120s, respirations in the 20s, blood pressure in the 90s to 140s. Satting 94% on room air. White blood cell count 11.9, hemoglobin 12.6, potassium 6.3. Creatinine 7.4. Bicarb 14. AST 904, ALT 340, ALP 207, t bili 1.4. Troponin 1.19. EKG sinus tachycardia. Urinalysis indicative of infection. Was given 1 L normal saline bolus in the ER. Also given Kayexalate oral. Chest x-ray noted cardiomegaly and left basilar atelectasis. Pulmonary vascular congestion. Head CT within normal limits. CT of abdomen and pelvis noted anasarca, cholelithiasis, abdominal adenopathy, and left lower lobe pulmonary nodule.  Assessment & Plan   Toxic metabolic encephalopathy -Likely multifactorial including sepsis, UTI, uremia. Back to baseline  Sepsis secondary UTI vs ?cellulitis abdominal wall/legs Continue empiric vancomycin and zosyn, as she is improving on this regimen.   Urine cx negative. BC neg to date  Chronic abdominal wounds: calciphylaxis? Resume home pain medication regimen  Chronic hypotension Continue midodrine. asymptomatic  Elevated troponin Likely from sepsis, ESRD. Cardiology has signed off. No further workup. ASA and plavix held due to coagulopathy  Transaminitis/coagulopathy/hepatic encephalopathy/shock liver LFTs, ammonia improving. Still coagulopathic. GI signed off.  Pericardial friction rub -Echocardiogram 12/23: EF of 25%  End-stage renal disease requiring hemodialysis   Hyperkalemia -Resolved  Diabetes mellitus with retinopathy, nephropathy Low CBGs today. Decrease SSI and levemir  Morbid obesity -BMI 42 -Patient will need outpatient follow-up to discuss lifestyle modifications  GERD  Anemia of CKD  History of lung cancer status post left lower lobe lobectomy 2013  Advance to solids. Increase activity. PT eval transfer to floor  Coagulopathy  Right arm swelling: check doppler  Code Status: Full  Family Communication: Husband at bedside  Disposition Plan:   Time Spent in minutes   20 minutes  Procedures  Echocardiogram  Consults   Cardiology Gastroenterology Nephrology PCCM via phone  DVT Prophylaxis  SCDs  Lab Results  Component Value Date   PLT 345 04/14/2014    Medications  Scheduled Meds: . cinacalcet  30 mg Oral Q supper  . darbepoetin (ARANESP) injection - DIALYSIS  100 mcg Intravenous Q Wed-HD  . ferric gluconate (FERRLECIT/NULECIT) IV  125 mg Intravenous Q M,W,F-HD  . insulin aspart  0-9 Units Subcutaneous TID WC  . insulin detemir  10 Units Subcutaneous QPM  . midodrine  10 mg Oral TID  . multivitamin  1 tablet Oral QHS  . OxyCODONE  30 mg Oral Q12H  . piperacillin-tazobactam (ZOSYN)  IV  2.25 g Intravenous Q8H  . sevelamer carbonate  2,400 mg Oral TID WC  . silver sulfADIAZINE   Topical Daily  . [START ON 05-02-14] vancomycin  1,000 mg Intravenous Q M,W,F-HD   Continuous  Infusions:  PRN Meds:.HYDROmorphone, morphine injection, polyvinyl alcohol  Antibiotics    Anti-infectives    Start     Dose/Rate Route Frequency Ordered Stop   May 02, 2014 1200  vancomycin (VANCOCIN) IVPB 1000 mg/200 mL premix     1,000 mg200 mL/hr over 60 Minutes Intravenous Every M-W-F (Hemodialysis) 04/14/14 1018     04/13/14 1300  piperacillin-tazobactam (ZOSYN) IVPB 2.25 g     2.25 g100 mL/hr over 30 Minutes Intravenous Every 8 hours 04/13/14 1237     04/13/14 1200  vancomycin (VANCOCIN) IVPB 1000 mg/200 mL premix     1,000 mg200 mL/hr over 60 Minutes Intravenous Every Sat (Hemodialysis) 04/12/14 1355 04/13/14 1340   04/10/14 1200  vancomycin (VANCOCIN) IVPB 1000 mg/200 mL premix  Status:  Discontinued     1,000 mg200 mL/hr over 60 Minutes Intravenous Every M-W-F (Hemodialysis) 04/04/2014 2240 04/12/14 1355   04/10/14 0000  piperacillin-tazobactam (ZOSYN) IVPB 2.25 g  Status:  Discontinued     2.25 g100 mL/hr over 30 Minutes Intravenous Every 8 hours 03/19/2014 2240 04/13/14 1237   04/05/2014 2245  vancomycin (VANCOCIN) IVPB 1000 mg/200 mL premix  Status:  Discontinued     1,000 mg200 mL/hr over 60 Minutes Intravenous  Once 03/23/2014 2240 04/10/14 1533   04/12/2014 1645  vancomycin (VANCOCIN) IVPB 1000 mg/200 mL premix     1,000 mg200 mL/hr over 60 Minutes Intravenous  Once 03/28/2014 1634 03/21/2014 1900   03/22/2014 1645  piperacillin-tazobactam (ZOSYN) IVPB 3.375 g     3.375 g100 mL/hr over 30 Minutes Intravenous  Once 03/20/2014 1634 03/31/2014 1725      Subjective:   C/o right arm swelling started PTA. Pain better on current regimen  Objective:   Filed Vitals:   04/14/14 0556 04/14/14 0756 04/14/14 0927 04/14/14 1040  BP: 71/37 84/56 86/65  78/55  Pulse: 71 111 120   Temp:      TempSrc:      Resp:      Height:      Weight:      SpO2:  98%      Wt Readings from Last 3 Encounters:  04/13/14 123.106 kg (271 lb 6.4 oz)  03/19/14 119.75 kg (264 lb)  03/19/14 119.795 kg (264 lb 1.6 oz)       Intake/Output Summary (Last 24 hours) at 04/14/14 1308 Last data filed at 04/14/14 0859  Gross per 24 hour  Intake    240 ml  Output      0 ml  Net    240 ml    Exam  General: obese, a and o. Weak appearing  Cardiovascular: S1 S2 auscultated, RRR  Respiratory: Clear to auscultation bilaterally without WRR  Abdomen: Soft, obese, tender, nondistended, + bowel sounds, lesions and wounds noted to LLQ and RLQ with erythema, necrotic black eschar noted on LLQ. Peau d'orange  Extremities: warm dry without cyanosis clubbing. LE edema and erythema. Right arm edematous   Data Review   Micro Results  Recent Results (from the past 240 hour(s))  Blood Culture (routine x 2)     Status: None (Preliminary result)   Collection Time: 04/10/2014  3:50 PM  Result Value Ref Range Status   Specimen Description BLOOD ARM LEFT  Final   Special Requests BOTTLES DRAWN AEROBIC AND ANAEROBIC 5CC  Final   Culture  Setup Time   Final    04/10/2014 21:15 Performed at [facility]    Culture   Final           BLOOD CULTURE RECEIVED NO GROWTH TO DATE CULTURE WILL BE HELD FOR 5 DAYS BEFORE ISSUING A FINAL NEGATIVE REPORT Performed at [facility]    Report Status PENDING  Incomplete  Blood Culture (routine x 2)     Status: None (Preliminary result)   Collection Time: 04/06/2014  5:25 PM  Result Value Ref Range Status   Specimen Description BLOOD FOREARM RIGHT  Final   Special Requests BOTTLES DRAWN AEROBIC ONLY [place]  Final   Culture  Setup Time   Final    04/07/2014 22:36 Performed at [facility]    Culture   Final           BLOOD CULTURE RECEIVED NO GROWTH TO DATE CULTURE WILL BE HELD FOR 5 DAYS BEFORE ISSUING A FINAL NEGATIVE REPORT Performed at [facility]    Report Status PENDING  Incomplete  Urine culture     Status: None   Collection Time: 03/19/2014  6:20 PM  Result Value Ref Range Status   Specimen Description URINE, CATHETERIZED  Final   Special  Requests NONE  Final   Culture  Setup Time   Final    04/10/2014 01:21 Performed at [place] Performed at [facility]   Final   Culture NO GROWTH Performed at [facility]   Final   Report Status 04/10/2014 FINAL  Final  MRSA PCR Screening     Status: None   Collection Time: 04/05/2014 11:30 PM  Result Value Ref Range Status   MRSA by PCR NEGATIVE NEGATIVE Final    Comment:        The GeneXpert MRSA Assay (FDA approved for NASAL specimens only), is one component of a comprehensive MRSA colonization surveillance program. It is not intended to diagnose MRSA infection nor to guide or monitor treatment for MRSA infections.     Radiology Reports Ct Abdomen Pelvis Wo Contrast  04/14/2014   CLINICAL DATA:  Fall 3 days ago.  Lethargy.  EXAM: CT ABDOMEN AND PELVIS WITHOUT CONTRAST  TECHNIQUE: Multidetector CT imaging of the abdomen and pelvis was performed following the standard protocol without IV contrast.  COMPARISON:  02/15/2014  FINDINGS: Lack of oral and intravenous contrast limits the exam. Motion artifact limits the exam. Overall, this is a severely limited examination.  Stable left lower lobe pulmonary nodule.  Stable left pleural effusion with features of loculation.  Stable small pericardial effusion.  Gallstones have developed.  Liver, spleen, pancreas are grossly within normal limits.  There is nonspecific stranding involving the left adrenal gland. Right adrenal gland is stable with some calcifications.  Lymph stones image gastrohepatic ligament and about the aorta are stable compatible with mild adenopathy.  Stable appearance of the kidneys.  Left renal cyst.  Bladder is decompressed.  There is stranding and soft tissue density within the right inguinal region which is stable. Stranding throughout the subcutaneous fat and intraperitoneal fat is  present compatible with diffuse anasarca.  Atherosclerotic calcifications are  present and stable  Uterus is unremarkable.  IMPRESSION: Cholelithiasis is now evident.  Diffuse anasarca.  Other findings are stable compared to the prior study. This includes a left lower lobe pulmonary nodule as well as abdominal adenopathy. There is also stranding within the soft tissues of the right inguinal region. An inflammatory process is not excluded.   Electronically Signed   By: Maryclare Bean M.D.   On: 03/31/2014 20:34   Dg Chest 2 View  03/20/2014   CLINICAL DATA:  Productive cough and back pain for 2 weeks. Evaluate for pneumonia.  EXAM: CHEST  2 VIEW  COMPARISON:  12/28/2013; 10/06/2013  FINDINGS: Grossly unchanged enlarged cardiac silhouette and mediastinal contours. Stable positioning of support apparatus. Grossly unchanged bibasilar heterogeneous opacities, left greater than right, likely atelectasis. Pulmonary venous congestion without frank evidence of edema. Pleural parenchymal thickening about the bilateral major and the right minor fissures. No pleural effusion or pneumothorax. Unchanged bones.  IMPRESSION: 1. No acute cardiopulmonary disease. Specifically, no evidence of pneumonia. 2. Similar findings of cardiomegaly, pulmonary venous congestion and bibasilar atelectasis.   Electronically Signed   By: Sandi Mariscal M.D.   On: 03/20/2014 08:18   Ct Head Wo Contrast  04/07/2014   CLINICAL DATA:  Weakness  EXAM: CT HEAD WITHOUT CONTRAST  TECHNIQUE: Contiguous axial images were obtained from the base of the skull through the vertex without intravenous contrast.  COMPARISON:  07/28/2011  FINDINGS: No skull fracture is noted. Paranasal sinuses and mastoid air cells are unremarkable. Postsurgical high-density material noted right eye globe.  No intracranial hemorrhage, mass effect or midline shift. No acute cortical infarction. No mass lesion is noted on this unenhanced scan. The gray and white-matter differentiation is is preserved. Ventricular size is stable from prior exam.  IMPRESSION: No  acute intracranial abnormality.  No significant change.   Electronically Signed   By: Lahoma Crocker M.D.   On: 04/04/2014 17:23   Dg Chest Port 1 View  (if Code Sepsis Called)  04/12/2014   CLINICAL DATA:  Lethargy, shortness of Breath  EXAM: PORTABLE CHEST - 1 VIEW  COMPARISON:  03/19/2014  FINDINGS: Cardiomegaly again noted. Dual lumen left IJ catheter is unchanged in position. Central mild vascular congestion without convincing pulmonary edema. Mild left basilar atelectasis. No segmental infiltrate.  IMPRESSION: Cardiomegaly .Mild left basilar atelectasis. No segmental infiltrate. Central mild vascular congestion without pulmonary edema.   Electronically Signed   By: Lahoma Crocker M.D.   On: 03/30/2014 16:22    CBC  Recent Labs Lab 04/04/2014 1550  04/10/14 0324 04/10/14 0710 04/11/14 0320 04/12/14 0325 04/13/14 0505 04/14/14 0502  WBC 11.9*  < > 11.2* 11.9* 11.6* 13.1* 15.3* 15.5*  HGB 9.5*  < > 9.3* 9.0* 9.7* 10.5* 10.7* 10.4*  HCT 32.7*  < > 31.8* 31.0* 33.0* 35.1* 35.0* 33.6*  PLT 449*  < > 431* 425* 397 378 383 345  MCV 94.8  < > 93.0 92.8 91.9 90.7 90.0 91.1  MCH 27.5  < > 27.2 26.9 27.0 27.1 27.5 28.2  MCHC 29.1*  < > 29.2* 29.0* 29.4* 29.9* 30.6 31.0  RDW 19.3*  < > 19.3* 19.5* 19.5* 19.7* 20.0* 20.3*  LYMPHSABS 0.6*  --  0.8  --   --   --   --  0.6*  MONOABS 0.5  --  0.5  --   --   --   --  1.1*  EOSABS 0.0  --  0.0  --   --   --   --  0.2  BASOSABS 0.0  --  0.0  --   --   --   --  0.0  < > = values in this interval not displayed.  Chemistries   Recent Labs Lab 03/28/2014 1550  04/10/14 0324 04/10/14 0710 04/11/14 0320 04/12/14 0325 04/13/14 0505 04/14/14 0502  NA 134*  < > 137 135 140 139 134* 134*  K 6.3*  < > 5.8* 5.3* 4.2 4.6 5.1 5.0  CL 99  < > 100 97 103 100 97 97  CO2 14*  --  16* 18* 22 22 18* 22  GLUCOSE 263*  < > 275* 232* 114* 82 86 160*  BUN 77*  < > 83* 84* 56* 70* 82* 77*  CREATININE 8.01*  < > 8.01* 7.92* 6.04* 6.96* 7.85* 7.41*  CALCIUM 7.9*  --   7.6* 7.1* 7.3* 7.0* 6.6* 6.6*  AST 904*  --  2368*  --  1072*  --  199* 86*  ALT 339*  --  929*  --  817*  --  500* 312*  ALKPHOS 207*  --  180*  --  155*  --  137* 135*  BILITOT 1.4*  --  1.2  --  1.2  --  3.6* 3.3*  < > = values in this interval not displayed. ------------------------------------------------------------------------------------------------------------------ estimated creatinine clearance is 13 mL/min (by C-G formula based on Cr of 7.41). ------------------------------------------------------------------------------------------------------------------ No results for input(s): HGBA1C in the last 72 hours. ------------------------------------------------------------------------------------------------------------------ No results for input(s): CHOL, HDL, LDLCALC, TRIG, CHOLHDL, LDLDIRECT in the last 72 hours. ------------------------------------------------------------------------------------------------------------------ No results for input(s): TSH, T4TOTAL, T3FREE, THYROIDAB in the last 72 hours.  Invalid input(s): FREET3 ------------------------------------------------------------------------------------------------------------------ No results for input(s): VITAMINB12, FOLATE, FERRITIN, TIBC, IRON, RETICCTPCT in the last 72 hours.  Coagulation profile  Recent Labs Lab 04/10/14 0350 04/11/14 0320 04/12/14 0325 04/14/14 0502  INR 2.13* 2.54* 2.44* 2.70*    No results for input(s): DDIMER in the last 72 hours.  Cardiac Enzymes  Recent Labs Lab 03/26/2014 1850 04/03/2014 2234 04/10/14 0350  TROPONINI 1.19* 1.42* 1.30*   ------------------------------------------------------------------------------------------------------------------ Invalid input(s): POCBNP    Marca Gadsby L md. on 04/14/2014 at 1:08 PM  Text page [email or web address] - password [facility]  [facility]

## 2014-04-14 NOTE — Progress Notes (Signed)
Blood pressure 71/37, pulse 71, temperature 97.5 F (36.4 C)  SpO2 95 %. M. Schorr NP notified. No new orders placed. Will to continue to monitor. Velora Mediate

## 2014-04-14 NOTE — Significant Event (Signed)
Rapid Response Event Note Asked to see patient while rounding. BP low during day shift, continues to be low at this time. Per floor RN unsure of the automatic BP is accurate. BP checked with auto cuff on bilateral legs and left arm yielding similar results prior to my arrival.   Overview: Time Called: 2215 Arrival Time: 2215 Event Type: Hypotension  Initial Focused Assessment: Pt found resting in bed, screaming in pain ABD 10/10. LLQ of abdomen red inflamed, black eschar in middle of wound, abdomen firm. Dopplered  systolic 71 in left arm, matching well with auto cuff in left arm 69/36. HR 120s, axillary temp 99.3, pt warm to touch and I suspect her temp is higher. Unable to obtain a rectal temp as pt is refusing to turn due to pain.    Interventions: S.Osman NP paged and updated on pt status. NP made aware of abdomens appearance and current VS, as well as severe pain. Will give 1 mg Morphine IVP with 500 NS bolus over 2 hrs. Lactic Acid blood work to be done [employer].   Floor RN to monitor pt closely, RRT to round and follow up tonight.  Event Summary: Name of Physician Notified: S.Osman NP [facility] at 2210    at    Outcome: Stayed in room and stabalized     [place], [place]

## 2014-04-14 NOTE — Plan of Care (Signed)
Problem: Phase I Progression Outcomes Goal: Hemodynamically stable Outcome: Not Progressing Today's Vitals    04/13/14 2002 04/13/14 2007 04/13/14 2055 04/13/14 2120  BP:   86/55   96/44  Pulse:   110   114  Temp:          TempSrc:   Oral      Resp:   16      Height:          Weight:   123.106 kg (271 lb 6.4 oz)      SpO2:   94%      PainSc: 10-Worst pain ever   8

## 2014-04-14 NOTE — Progress Notes (Signed)
PT Cancellation Note  Patient Details Name: Hannah [name] MRN: [id number] DOB: 03-Aug-1970   Cancelled Treatment:    Reason Eval/Treat Not Completed: Medical issues which prohibited therapy Patient's BP 66/53, just taken by RN. Pain is poorly controlled at this time. Will follow up for PT evaluation as time allows when patient is medically ready.  [place], [place] Hannah [name] 04/14/2014, 9:24 AM

## 2014-04-14 NOTE — Progress Notes (Signed)
Subjective:  Mild LLQ abdominal pain, poor appetite, breathing well  Objective: Vital signs in last 24 hours: Temp:  [96.8 F (36 C)-98.6 F (37 C)] 97.5 F (36.4 C) (12/27 0426) Pulse Rate:  [71-115] 71 (12/27 0556) Resp:  [9-16] 15 (12/27 0426) BP: (71-132)/(37-81) 71/37 mmHg (12/27 0556) SpO2:  [94 %-98 %] 95 % (12/27 0426) Weight:  [121.6 kg (268 lb 1.3 oz)-123.106 kg (271 lb 6.4 oz)] 123.106 kg (271 lb 6.4 oz) (12/26 2007) Weight change: 0.7 kg (1 lb 8.7 oz)  Intake/Output from previous day: 12/26 0701 - 12/27 0700 In: 240 [P.O.:240] Out: 480  Intake/Output this shift:   Lab Results:  Recent Labs  04/13/14 0505 04/14/14 0502  WBC 15.3* 15.5*  HGB 10.7* 10.4*  HCT 35.0* 33.6*  PLT 383 345   BMET:  Recent Labs  04/13/14 0505 04/14/14 0502  NA 134* 134*  K 5.1 5.0  CL 97 97  CO2 18* 22  GLUCOSE 86 160*  BUN 82* 77*  CREATININE 7.85* 7.41*  CALCIUM 6.6* 6.6*  ALBUMIN 1.3* 1.2*   No results for input(s): PTH in the last 72 hours. Iron Studies: No results for input(s): IRON, TIBC, TRANSFERRIN, FERRITIN in the last 72 hours.  Studies/Results: No results found.  EXAM: General appearance:  Somnolent, but able to arouse, in no apparent distress Resp:  CTA without rales, rhonchi, or wheezes Cardio:  RRR with Gr II/VI systolic murmur, no rub GI:  + BS, soft, diffuse tenderness, necrotic black eschar with surrounding erythema @ LLQ  Extremities:  Trace edema bilaterally, dressings over lower legs Access: L IJ catheter   Dialysis Orders:  MWF @ [facility] 4 hrs     116 kg      2K/2Ca      400/A2.0     Heparin 5000 U     L IJ catheter Hectorol 3 mcg      Aranesp 80 mcg on Wed        Venofer 100 mg x 10 (through 1/11)  Assessment/Plan: 1. AMS from metabolic encephalopathy - now @ baseline; suspected to be sec to UTI (culture negative), cellulitis; on empiric Vancomycin & Zosyn. 2. ESRD - HD on MWF @ [facility], K 5.  Next HD tomorrow. 3. HTN/Volume - BP down to 71/37,  Midodrine 5 mg tid; wt 123.1 kg s/p net UF only 480 ml sec to low BPs.  Increase Midodrine to 10 mg tid. 4. Anemia - Hgb 10.4, Aranesp 100 mcg on Wed, Fe loading. 5. Sec HPT - Ca 6.6 (8.6 corrected); Sensipar 30 mg qd, Renvela 3 with meals. 6. Nutrition - protein/calorie malnutrition compounded by multiple medical problems; Alb 1.2, renal carb-mod diet, vitamin.   LOS: 5 days   Hannah [name] 04/14/2014,7:38 AM

## 2014-04-14 NOTE — Progress Notes (Signed)
Pt blood pressure 86/65, pt is crying requesting pain medicine. Notified MD, she instructed me to give pain medicine.    Penni Bombard, RN 04/14/2014 10:19 AM

## 2014-04-15 LAB — RENAL FUNCTION PANEL
Albumin: 1.2 g/dL — ABNORMAL LOW (ref 3.5–5.2)
Anion gap: 21 — ABNORMAL HIGH (ref 5–15)
BUN: 88 mg/dL — ABNORMAL HIGH (ref 6–23)
CHLORIDE: 96 meq/L (ref 96–112)
CO2: 18 mmol/L — AB (ref 19–32)
CREATININE: 7.91 mg/dL — AB (ref 0.50–1.10)
Calcium: 6.8 mg/dL — ABNORMAL LOW (ref 8.4–10.5)
GFR calc Af Amer: 6 mL/min — ABNORMAL LOW (ref >90)
GFR calc non Af Amer: 5 mL/min — ABNORMAL LOW (ref >90)
Glucose, Bld: 143 mg/dL — ABNORMAL HIGH (ref 70–99)
Phosphorus: 9.6 mg/dL — ABNORMAL HIGH (ref 2.3–4.6)
Potassium: 6 mmol/L — ABNORMAL HIGH (ref 3.5–5.1)
Sodium: 135 mmol/L (ref 135–145)

## 2014-04-15 LAB — CULTURE, BLOOD (ROUTINE X 2)
Culture: NO GROWTH
Culture: NO GROWTH

## 2014-04-15 LAB — CBC
HCT: 33.3 % — ABNORMAL LOW (ref 36.0–46.0)
HCT: 35.4 % — ABNORMAL LOW (ref 36.0–46.0)
Hemoglobin: 10.3 g/dL — ABNORMAL LOW (ref 12.0–15.0)
Hemoglobin: 10.9 g/dL — ABNORMAL LOW (ref 12.0–15.0)
MCH: 27.4 pg (ref 26.0–34.0)
MCH: 27.8 pg (ref 26.0–34.0)
MCHC: 30.9 g/dL (ref 30.0–36.0)
MCHC: 30.8 g/dL (ref 30.0–36.0)
MCV: 90 fL (ref 78.0–100.0)
MCV: 88.9 fL (ref 78.0–100.0)
Platelets: 350 10*3/uL (ref 150–400)
Platelets: 390 10*3/uL (ref 150–400)
RBC: 3.7 MIL/uL — ABNORMAL LOW (ref 3.87–5.11)
RBC: 3.98 MIL/uL (ref 3.87–5.11)
RDW: 20.3 % — ABNORMAL HIGH (ref 11.5–15.5)
RDW: 20.4 % — ABNORMAL HIGH (ref 11.5–15.5)
WBC: 17 10*3/uL — ABNORMAL HIGH (ref 4.0–10.5)
WBC: 18.7 10*3/uL — ABNORMAL HIGH (ref 4.0–10.5)

## 2014-04-15 LAB — GLUCOSE, CAPILLARY: GLUCOSE-CAPILLARY: 110 mg/dL — AB (ref 70–99)

## 2014-04-15 LAB — BASIC METABOLIC PANEL WITH GFR
Anion gap: 23 — ABNORMAL HIGH (ref 5–15)
BUN: 91 mg/dL — ABNORMAL HIGH (ref 6–23)
CO2: 39 mmol/L — ABNORMAL HIGH (ref 19–32)
Calcium: 6 mg/dL — CL (ref 8.4–10.5)
Chloride: 96 meq/L (ref 96–112)
Creatinine, Ser: 7.24 mg/dL — ABNORMAL HIGH (ref 0.50–1.10)
GFR calc Af Amer: 7 mL/min — ABNORMAL LOW
GFR calc non Af Amer: 6 mL/min — ABNORMAL LOW
Glucose, Bld: 95 mg/dL (ref 70–99)
Potassium: 7.4 mmol/L (ref 3.5–5.1)
Sodium: 158 mmol/L — ABNORMAL HIGH (ref 135–145)

## 2014-04-15 LAB — MAGNESIUM: Magnesium: 2 mg/dL (ref 1.5–2.5)

## 2014-04-15 LAB — PROTIME-INR
INR: 4.51 — ABNORMAL HIGH (ref 0.00–1.49)
Prothrombin Time: 43.2 s — ABNORMAL HIGH (ref 11.6–15.2)

## 2014-04-15 LAB — LACTIC ACID, PLASMA: LACTIC ACID, VENOUS: 3.3 mmol/L — AB (ref 0.5–2.2)

## 2014-04-15 LAB — PHOSPHORUS: Phosphorus: 10.1 mg/dL — ABNORMAL HIGH (ref 2.3–4.6)

## 2014-04-15 LAB — APTT: aPTT: 42 s — ABNORMAL HIGH (ref 24–37)

## 2014-04-15 MED ORDER — EPINEPHRINE HCL 0.1 MG/ML IJ SOSY
PREFILLED_SYRINGE | INTRAMUSCULAR | Status: AC
  Filled 2014-04-15: qty 10

## 2014-04-15 MED FILL — Medication: Qty: 1 | Status: AC

## 2014-04-19 NOTE — Code Documentation (Signed)
CODE BLUE NOTE  Patient Name: Hannah [name]   MRN: [id number]   Date of Birth/ Sex: October 26, 1970 , female      Admission Date: 03/23/2014  Attending Provider: Delfina Redwood, MD  Primary Diagnosis: UTI (lower urinary tract infection) [N39.0] Elevated troponin [R79.89] Elevated LFTs [R79.89] Sepsis, due to unspecified organism [A41.9]    Indication: Pt was in her usual state of health until this AM, when she was noted to be unresponsive and apneic. Code blue was subsequently called. At the time of arrival on scene, ACLS protocol was underway.   Technical Description:  - CPR performance duration:  60minutes  - Was defibrillation or cardioversion used? Yes   - Was external pacer placed? No  - Was patient intubated pre/post CPR? Yes    Medications Administered: Y = Yes; Blank = No Amiodarone    Atropine    Calcium  Y  Epinephrine  Y  Lidocaine    Magnesium    Norepinephrine    Phenylephrine    Sodium bicarbonate  Y  Vasopressin    Other Y    Post CPR evaluation:  - Final Status - Was patient successfully resuscitated ? No   Miscellaneous Information:  - Time of death:  48  - Primary team notified?  Yes  - Family Notified? Yes, Husband at Bedside     Physicians Responding to Code Blue: Joni Reining, Internal Medicine PGY-2 Junie Panning, DO, [facility] Medicine PGY-1   Lorna Few, DO   05-10-2014, 4:48 AM

## 2014-04-19 NOTE — Progress Notes (Signed)
Postmortem Note:  Incident: Debbora Dus, RN pulled a CODE BLUE on her patient while turning the patient over to get a rectal temperature  Assessment: On assessment patient had no respirations or heart sounds. Assessed patient pulse, which resulted in being pulseless. Extremities were cold to touch and mottled. CPR was initiated. CODE team called by this nurse. CODE team came to bedside.   Time of Death: [facility]: Not a candidate for organ donor services. Number [phone] by Rocky Crafts  Family: Husband at bedside. Mable Fill was at bedside  [place]: Patient will be taken to morgue once family is finished visiting  [place]: Notified. At CODE and beside   Patient Placement: Notified  [employer], RN-BC, [place] [address]

## 2014-04-19 NOTE — Progress Notes (Signed)
Called to inquire about pt status. Per RN lab results in, pt asleep. RN advised to get new set of VS and call Provider concerning labs.

## 2014-04-19 NOTE — Progress Notes (Signed)
Call received from floor RN regarding pt VS after fluid bolus. BP improved to 96/54. Rectal temp obtained yielding 100.9. RN advised to notify provider of new VS and labs once resulted. Pt complaining of continued ABD pain, RN to give oral dilaudid 2mg  as ordered. Will continue to follow.

## 2014-04-19 NOTE — Progress Notes (Signed)
Chaplain responded to CPR in progress and remained in room with husband Sherren Mocha during procedure. Husband was deeply concerned but fairly composed during CPR. He remained calm as doctor called off CPR.  Assisted husband in finding a phone to make phone calls as his phone did not have minutes.  Husband was unable to reach adult children but reached his mom, who came to the hospital after an hour plus. Husband was also able to reach and inform patient's father of death.  Family is still trying to reach patient and husband's pastor.  Provided grief support, prayer and brought coffee to husband.  Assisted in leaving message for daughter.  Husband is a strong believer in prayer and has a strong network of family and church support. Remained with husband until his mother (patient's mother-in-law) arrived.  At this time, father had just reached their son who will go get other daughter and adopted daughter and will be en route to hospital. Please call as needed for further assistance.  Luana Shu [phone]    30-Apr-2014 [id number]  Clinical Encounter Type  Visited With Family  Visit Type Patient actively dying;Death  Referral From Nurse  Consult/Referral To Chaplain  Spiritual Encounters  Spiritual Needs Grief support  Stress Factors  Family Stress Factors Major life changes

## 2014-04-19 NOTE — Progress Notes (Addendum)
Pt complaining of 10/10 abdominal pain requesting for PRN pain med. Blood pressure 69/36, pulse 126,  rectal temp 100.9, resp. rate 20,  spO2 100 %. Abdomen is warm to touch. Alene Mires NP notified, new orders for fluid bolus and lab draws placed and implemented. Brooke Therapist, sports from rapid response notified regarding patient condition. Will continue to monitor. Velora Mediate

## 2014-04-19 NOTE — Progress Notes (Signed)
Clarified with husband that he did not want a autopsy; so intubation tube removed.

## 2014-04-19 NOTE — Progress Notes (Addendum)
Shift Event: RN paged secondary to pt with consistent hypotension 60s/20, consistent tachycardia, and screaming of abdominal pain 10/10, requesting pain meds.  Will monitor for need to transfer to SDU   Ordered   500cc NS bolus infused over 2 hours  1 mg of IV Morphine   lactic acid check     Update: BP improved to 96/54, EKG- ST with possible anterior infarct (present on old EKG), patient with continued moaning due to pain- Ordered I dose of scheduled Dilaudid.    Update:  -pt is comfortably sleeping in bed. -LA elevated at 3.3, CBC with leukocytosis - Instructed nurse to wake up and reassess patient- if no improvement of signs of distress, will transfer to SDU - ordered repeat blood/urine cultures (Pt already on broad spectrum IV abd Vanc and Zosyn due to UTI/?Cellulitis)  Update: RN paged regarding Code blue was initiated. Per RN, when turing patient over to get vitals and complete assessment, patient was pulseless, with no respiration/ heart sounds. ACLS protocol was underway, pt expired at 4:41  [place] [facility] [phone]

## 2014-04-19 DEATH — deceased

## 2014-04-23 ENCOUNTER — Ambulatory Visit: Payer: Medicare Other | Admitting: Family Medicine

## 2014-05-20 NOTE — Discharge Summary (Signed)
Death Summary  Hannah [name]:[id number] DOB: 1971/04/10 DOA: April 18, 2014  PCP: Vic Blackbird, MD   Admit date: April 18, 2014 Date of Death: 04-24-2014 Time of death [phone]  Final Diagnoses:  Principal Problem:   Sepsis Active Problems:   Diabetic retinopathy associated with type 2 diabetes mellitus   ESRD (end stage renal disease) on dialysis   Cardiomyopathy, ischemic- EF 25-30% July 2015   Toxic Encephalopathy   UTI (lower urinary tract infection)   Elevated troponin   Transaminitis   Swelling of right upper extremity   Chronic hypotension Calciphylaxis Morbid obesity History of lung cancer History of leukemia CAD hyperkalemia  History of present illness:  44 y.o. year old female with significant past medical history of multiple medical problems including end-stage renal disease on dialysis Monday Wednesday and Friday, insulin-dependent diabetes, calciphylaxis, morbid obesity, coronary artery disease status post MI, ischemic cardiomyopathy, history of lung cancer status post lobectomy presenting with sepsis, encephalopathy, UTI, anasarca, elevated troponin, transaminitis. Husband reports patient with generalized malaise and weakness over the past 4-5 days. Was unable to go to dialysis on Friday secondary to weakness. Also missed dialysis yesterday. States that she fell down within the past one to 2 days. No direct head trauma. Has chronic abdominal pain has not worsened. Has noticed some redness and chronic ulcers on her abdomen that also have not change. Reports of biopsy to the left abdominal rash with some secondary mild eschar. No purulent drainage. EMS called home because of persistent lethargy. Presented to the ER temperature 100, heart rate in the 90s to 120s, respirations in the intensive 20s, blood pressure in the 90s to 140s. Satting 94% on room air. White blood cell count 11.9, hemoglobin 12.6, potassium 6.3. Creatinine 7.4. Bicarb 14. AST 904, ALT 340, ALP 207, t bili  1.4. Troponin 1.19. EKG sinus tachycardia. Urinalysis indicative of infection. Was given 1 L normal saline bolus in the ER. Also given Kayexalate oral. Chest x-ray shows cardiomegaly and left basilar atelectasis. Pulmonary vascular congestion. Head CT within normal limits. CT of abdomen and pelvis shows diffuse anasarca, cholelithiasis stable chronic findings including abdominal adenopathy and left lower lobe pulmonary nodule. I have asked ER physician assistant to consult both nephrology and cardiology.  Hospital Course:  Admitted to stepdown unit. Started on vancomycin and zosyn. Nephrology, cardiology, GI consulted.  Blood cultures remained negative. Sepsis felt to be secondary to UTI v. Cellulitis of legs and or abdomen.  GI felt increased LFTs secondary to shock liver. Cardiology recommended echocardiogram.  Hyperkalemia corrected with dialysis and kayexalate. Mentation, LFTs, potassium improved. Patient stabilized and was able to be moved out of stepdown unit. Has h/o chronic hypotension treated with midodrine. Midodrine increased by nephrology.  Patient has chronic abdominal pain from calciphylaxis. Home pain regimen continued.  On the night of 04/14/14, complained of increasing abdominal wall pain. Blood pressure noted to be 69/36, pulse 126, rectal temp 100.9. Received fluid bolus with improvement in blood pressure, given pain medication. Nurse noted patient sleeping and more comfortable.  RN checked on patient subsequently and patient noted to be pulseless and apneic.  Code Blue called and ACLS protocol initiated without return of pulse.     SignedDelfina Redwood  [facility] 04/21/2014, 8:43 PM

## 2014-06-17 ENCOUNTER — Other Ambulatory Visit (HOSPITAL_COMMUNITY): Payer: Medicare Other

## 2014-06-17 ENCOUNTER — Ambulatory Visit: Payer: Medicare Other | Admitting: Surgery

## 2014-08-18 DIAGNOSIS — I219 Acute myocardial infarction, unspecified: Secondary | ICD-10-CM

## 2014-08-18 HISTORY — DX: Acute myocardial infarction, unspecified: I21.9

## 2015-05-26 IMAGING — CR DG CHEST 1V PORT
1 series · 1 of 1 positions shown · non-contrast
Comparison: 08/04/2013

CLINICAL DATA: Shortness of breath increasing for 2 weeks. History
of CHF.

EXAM:
PORTABLE CHEST - 1 VIEW

[AP]
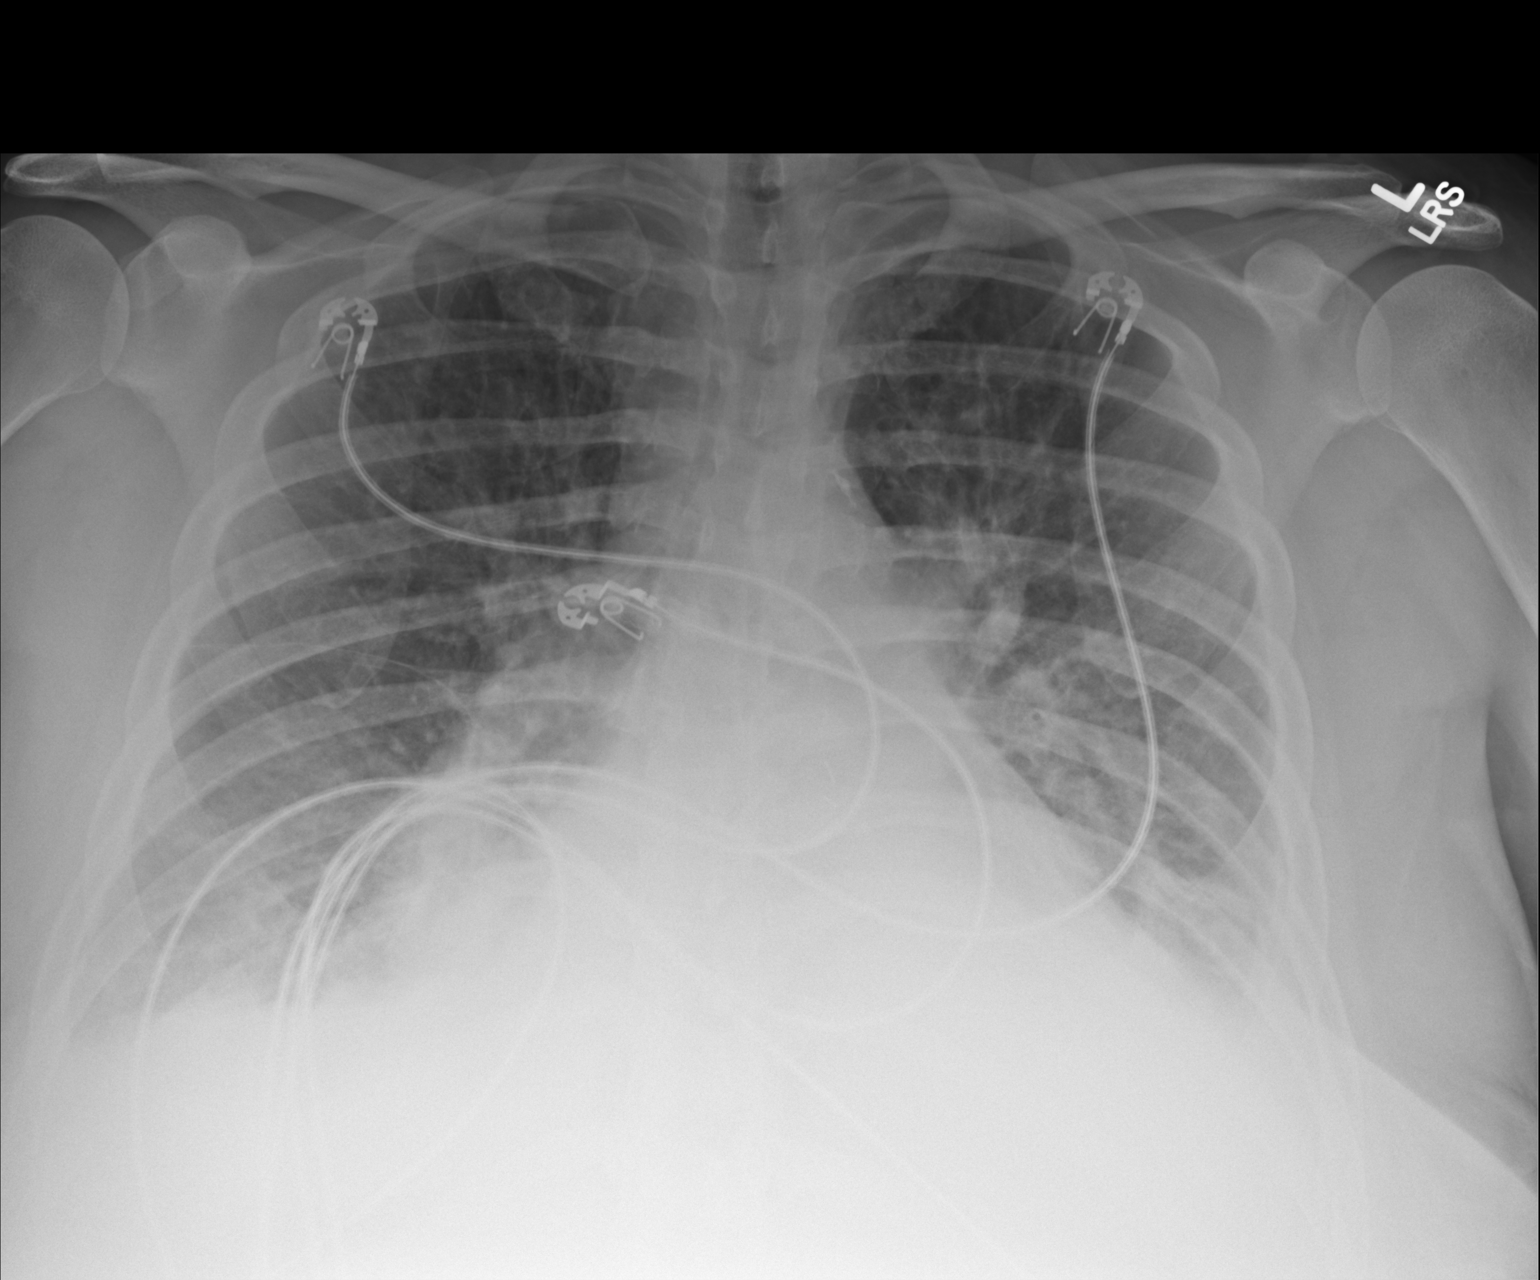

[1 of 1 positions shown; findings below may reference images not displayed]

FINDINGS: Again noted are bibasilar lung densities that are suggestive for
pleural effusions and atelectasis. Heart size appears to be
enlarged. There is some peribronchial thickening and cannot exclude
pulmonary edema.
IMPRESSION: Bilateral pleural effusions with basilar atelectasis. There is mild
pulmonary edema. Minimal change from the previous examination.

## 2015-06-23 IMAGING — CR DG CHEST 1V PORT
1 series · 1 of 1 positions shown · non-contrast
Comparison: 09/06/2013.

CLINICAL DATA: Congestive heart failure.

EXAM:
PORTABLE CHEST - 1 VIEW

[AP]
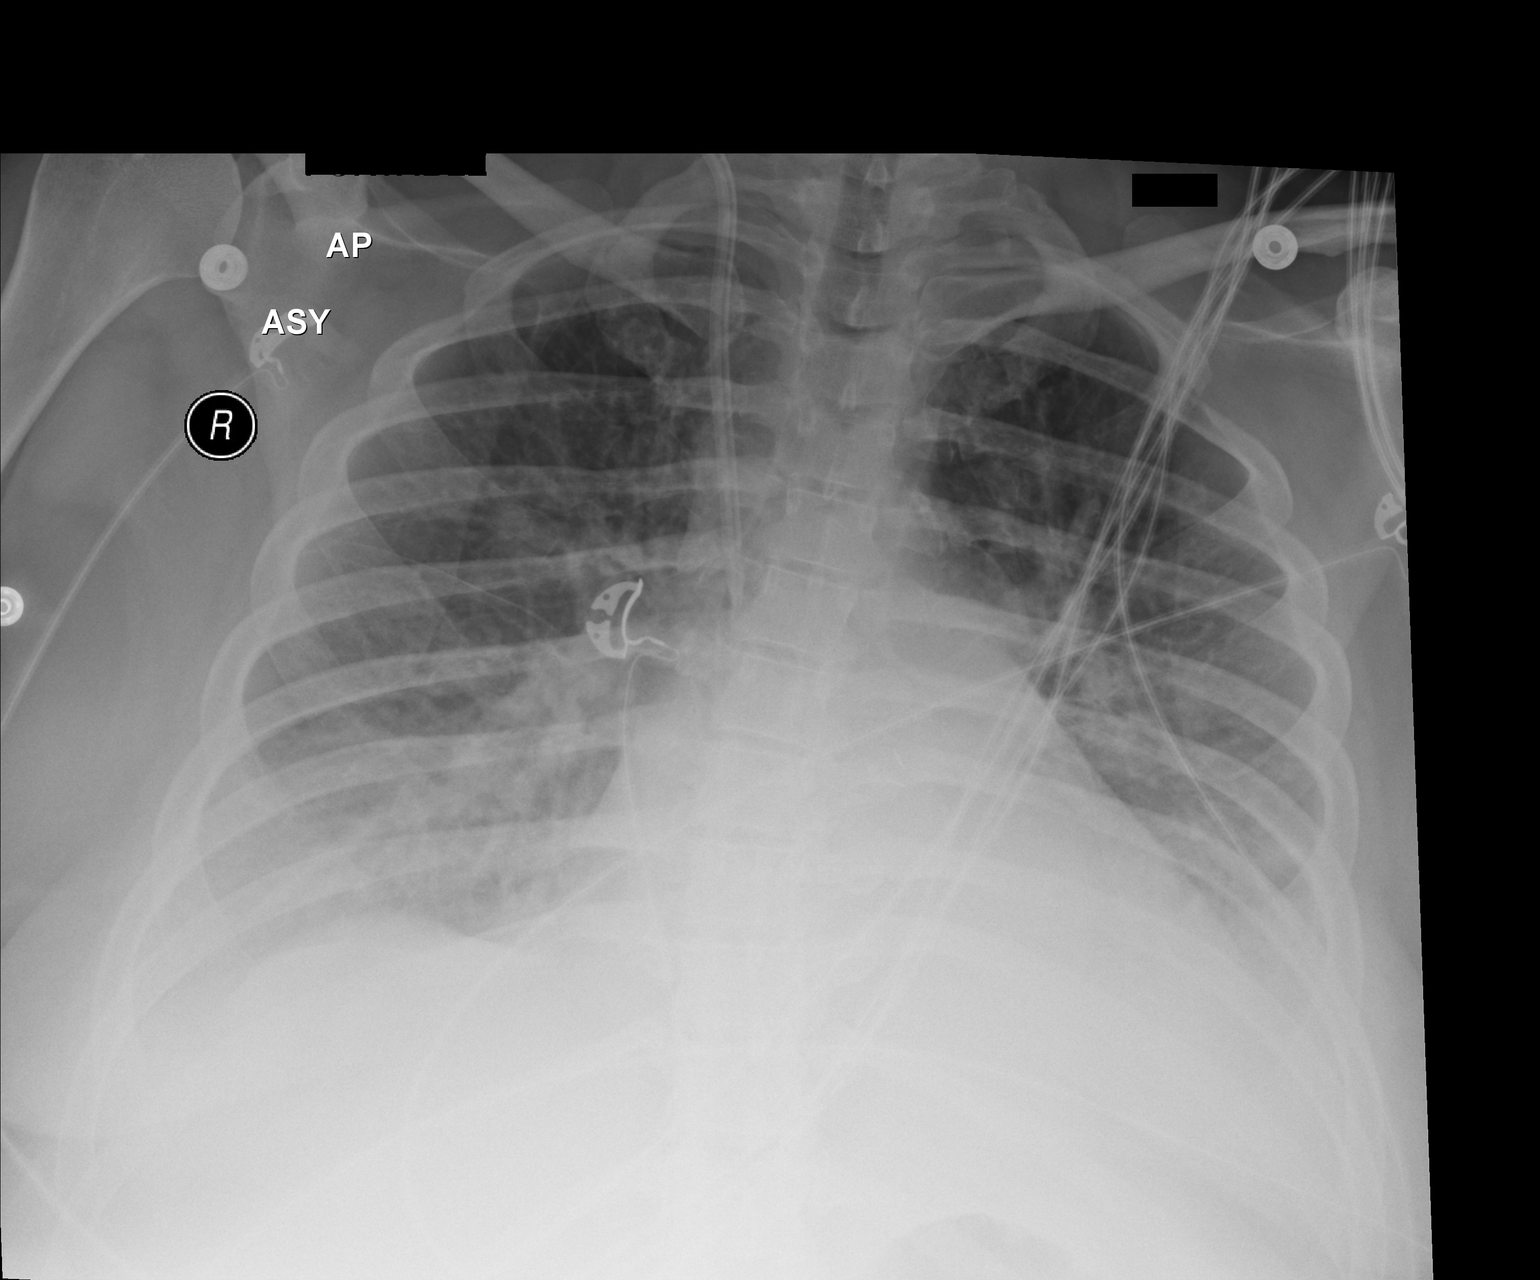

[1 of 1 positions shown; findings below may reference images not displayed]

FINDINGS: 8099 hr. Right IJ central venous catheter tip terminates at the
lower SVC level. The heart size and mediastinal contours are stable.
Pulmonary edema, bibasilar airspace opacities and bilateral pleural
effusions are unchanged. There is no evidence of pneumothorax.
Telemetry leads overlie the chest.
IMPRESSION: No significant change in edema, bilateral pleural effusions and
bibasilar atelectasis.

## 2015-06-30 IMAGING — CR DG CHEST 1V PORT
1 series · 1 of 1 positions shown · non-contrast
Comparison: Chest radiograph 09/16/2013.

CLINICAL DATA: ETT placement

EXAM:
PORTABLE CHEST - 1 VIEW

[AP]
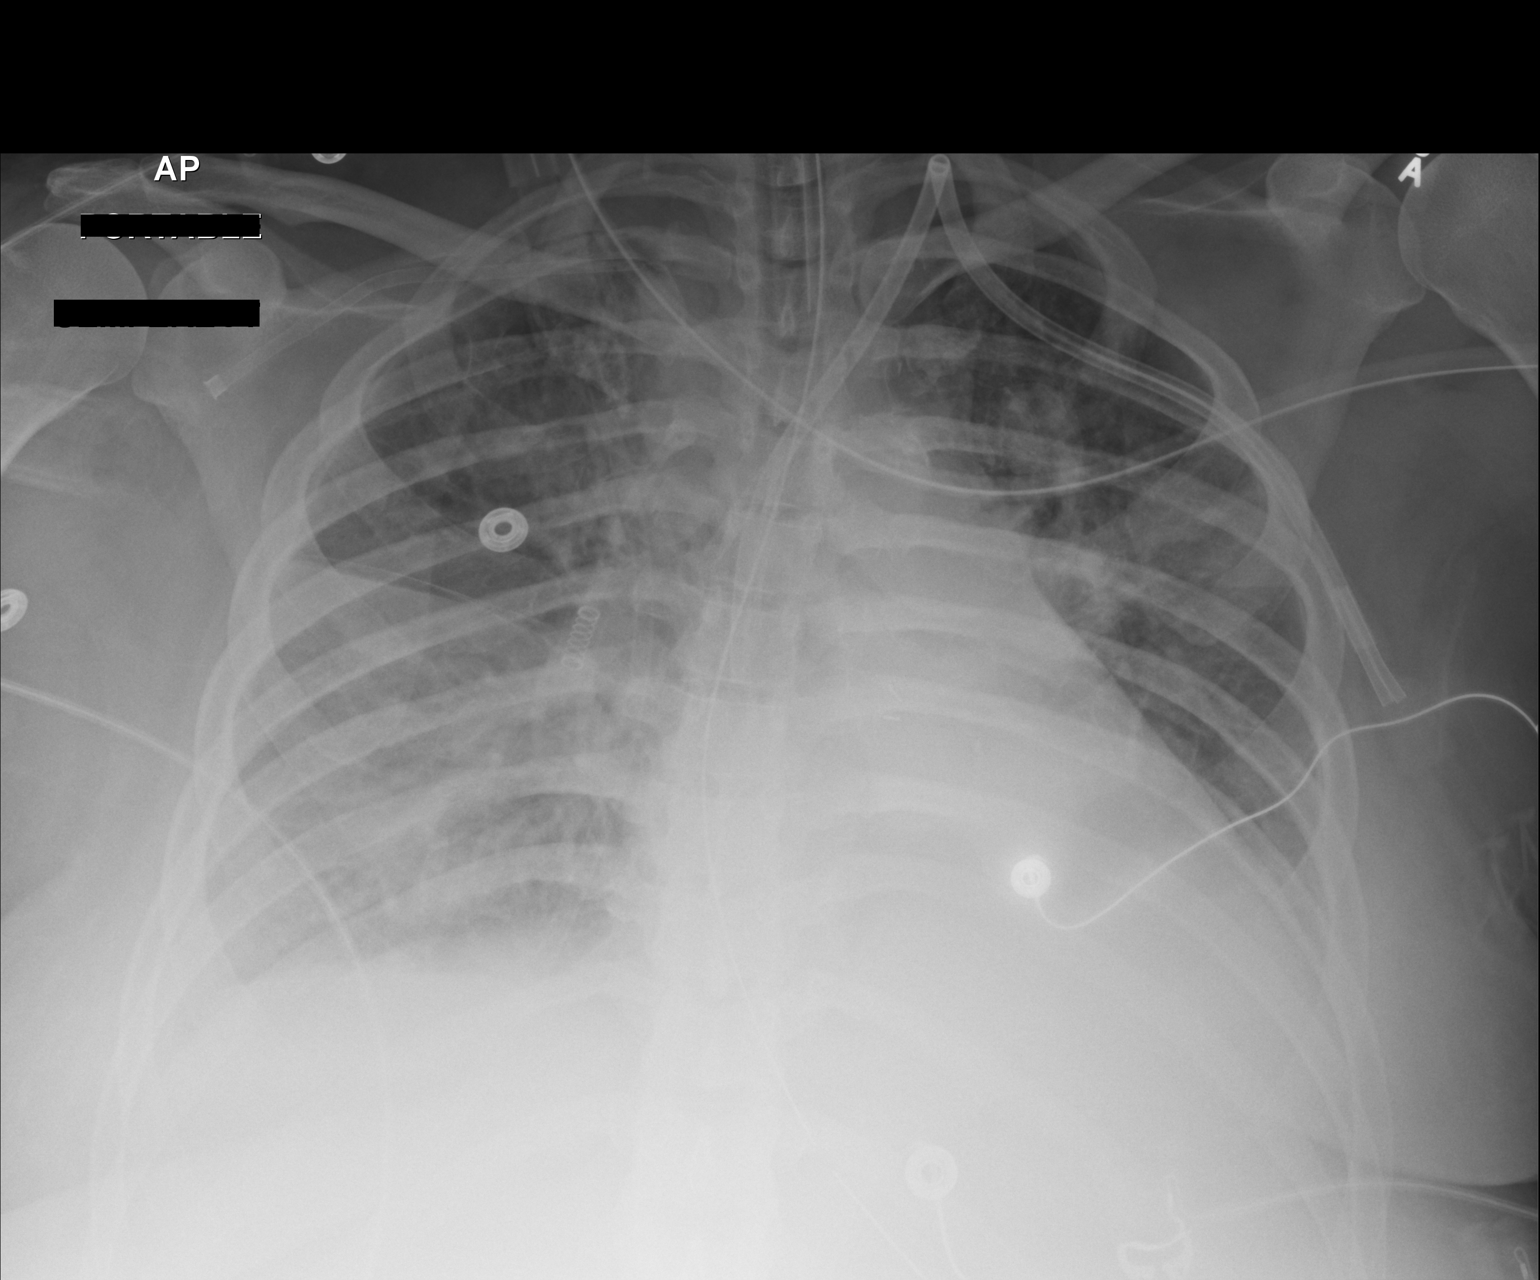

[1 of 1 positions shown; findings below may reference images not displayed]

FINDINGS: Low lung volumes. Cardiac silhouette is enlarged. Endotracheal tube
tip 7.6 cm above the carina projecting at the level of the
clavicles. The patient's Swan-Ganz catheter has been removed. The
left-sided central venous catheter stable. NG tube is appreciated
tip not viewed on this study. Persistent left lower lobe
consolidation. Decreased conspicuity of the right lower lobe
density. There is prominence of the interstitial markings. No acute
osseous abnormalities.
IMPRESSION: Endotracheal tube adequately positioned. Interval insertion of an NG
tube and removal of the patient's Swan-Ganz catheter. The left-sided
dialysis catheter stable.

Improved pulmonary edema.

Persistent left lower lobe density

## 2015-07-01 IMAGING — CR DG CHEST 1V PORT
1 series · 1 of 1 positions shown · non-contrast
Comparison: 09/17/2013

CLINICAL DATA: Check endotracheal tube following readjustment

EXAM:
PORTABLE CHEST - 1 VIEW

[AP]
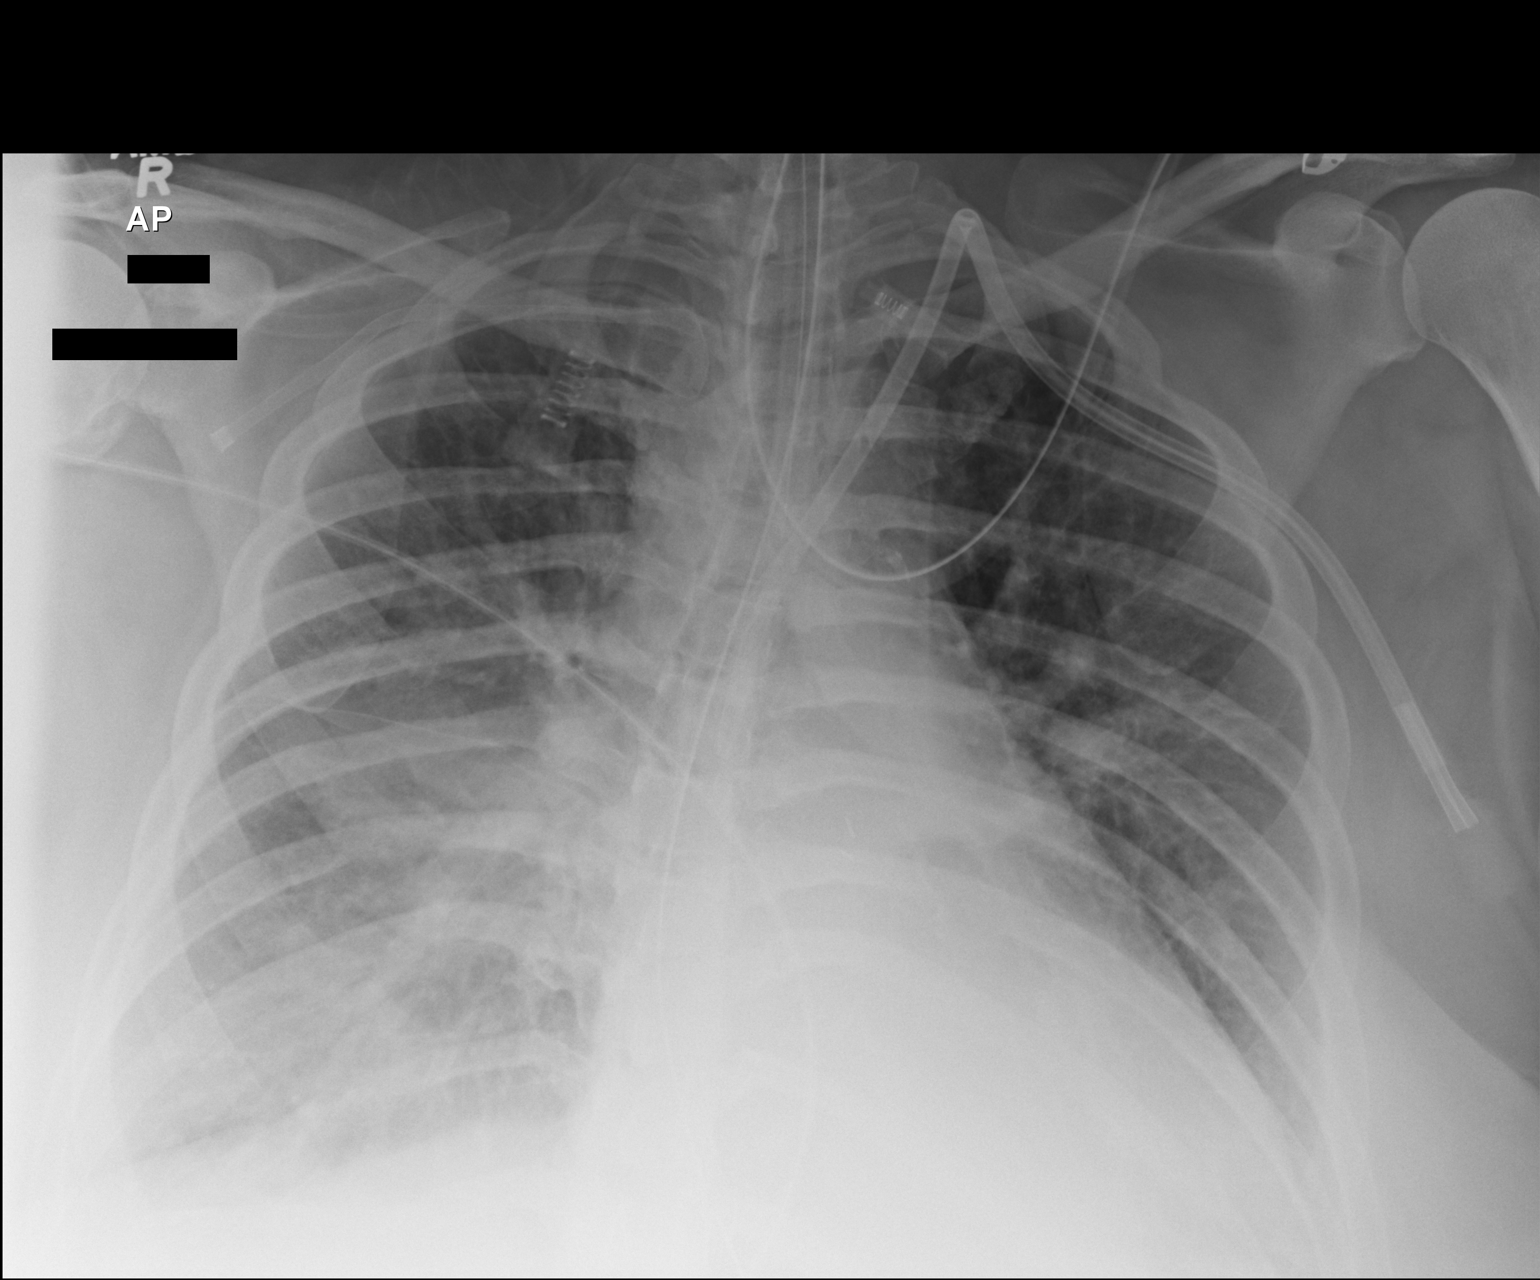

[1 of 1 positions shown; findings below may reference images not displayed]

FINDINGS: Cardiac shadow is stable. An endotracheal tube and nasogastric
catheter are again seen. The endotracheal tube now lie is 3.5 cm
above the carina. Patchy infiltrative changes are again seen
predominately within the right lower lobe but stable from the prior
exam. The left dialysis catheter is again identified and stable. A
right-sided venous sheath is again seen and stable.
IMPRESSION: Interval enhancement of the endotracheal tube as described above.

The remainder of the exam is stable from the prior study.

## 2015-07-19 IMAGING — CR DG CHEST 1V PORT
1 series · 1 of 1 positions shown · non-contrast
Comparison: Portal chest radiograph 09/20/2013

CLINICAL DATA: edema/effusion

EXAM:
PORTABLE CHEST - 1 VIEW

[AP]
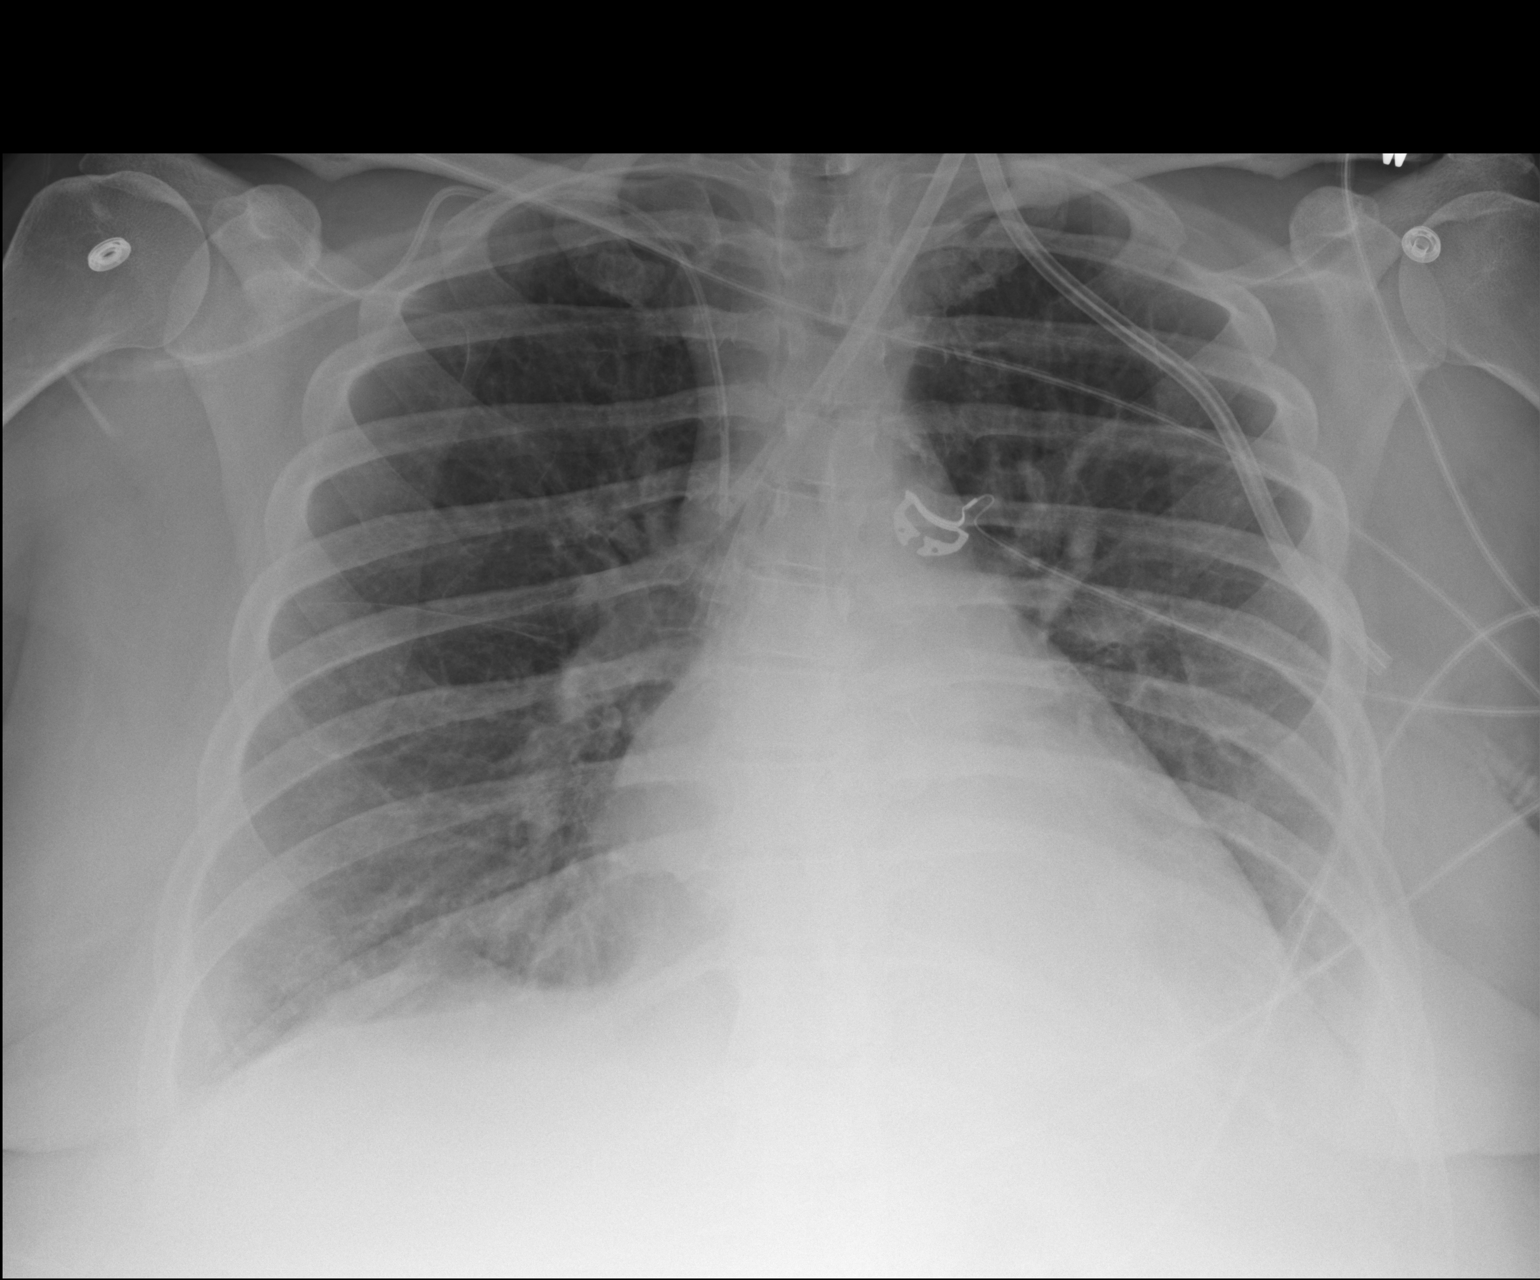

[1 of 1 positions shown; findings below may reference images not displayed]

FINDINGS: Stable central venous catheters. Stable cardiomegaly. Persistent
density in left lower lobe. Decrease conspicuity in interstitial
findings. Linear density right lung base.
IMPRESSION: Improving pulmonary edema

Collapse versus consolidation left lung base stable

Atelectasis right lung base

Stable central venous catheters.
